# Patient Record
Sex: Male | Born: 1950 | Race: White | Hispanic: No | Marital: Married | Smoking: Never smoker
Health system: Southern US, Community
[De-identification: ages and names within clinical notes are randomized; demographics above are authoritative.]

## PROBLEM LIST (undated history)

## (undated) DIAGNOSIS — F32A Depression, unspecified: Secondary | ICD-10-CM

## (undated) DIAGNOSIS — Z515 Encounter for palliative care: Secondary | ICD-10-CM

## (undated) DIAGNOSIS — M1990 Unspecified osteoarthritis, unspecified site: Secondary | ICD-10-CM

## (undated) DIAGNOSIS — N179 Acute kidney failure, unspecified: Secondary | ICD-10-CM

## (undated) DIAGNOSIS — F419 Anxiety disorder, unspecified: Secondary | ICD-10-CM

## (undated) DIAGNOSIS — N400 Enlarged prostate without lower urinary tract symptoms: Secondary | ICD-10-CM

## (undated) DIAGNOSIS — I2510 Atherosclerotic heart disease of native coronary artery without angina pectoris: Secondary | ICD-10-CM

## (undated) DIAGNOSIS — I255 Ischemic cardiomyopathy: Secondary | ICD-10-CM

## (undated) DIAGNOSIS — N529 Male erectile dysfunction, unspecified: Secondary | ICD-10-CM

## (undated) DIAGNOSIS — I4819 Other persistent atrial fibrillation: Secondary | ICD-10-CM

## (undated) DIAGNOSIS — I10 Essential (primary) hypertension: Secondary | ICD-10-CM

## (undated) DIAGNOSIS — K219 Gastro-esophageal reflux disease without esophagitis: Secondary | ICD-10-CM

## (undated) DIAGNOSIS — N183 Chronic kidney disease, stage 3 unspecified: Secondary | ICD-10-CM

## (undated) DIAGNOSIS — D689 Coagulation defect, unspecified: Secondary | ICD-10-CM

## (undated) DIAGNOSIS — R0602 Shortness of breath: Secondary | ICD-10-CM

## (undated) DIAGNOSIS — R7989 Other specified abnormal findings of blood chemistry: Secondary | ICD-10-CM

## (undated) DIAGNOSIS — I4891 Unspecified atrial fibrillation: Secondary | ICD-10-CM

## (undated) DIAGNOSIS — E785 Hyperlipidemia, unspecified: Secondary | ICD-10-CM

## (undated) DIAGNOSIS — J9601 Acute respiratory failure with hypoxia: Secondary | ICD-10-CM

## (undated) DIAGNOSIS — I5042 Chronic combined systolic (congestive) and diastolic (congestive) heart failure: Secondary | ICD-10-CM

## (undated) DIAGNOSIS — Z955 Presence of coronary angioplasty implant and graft: Secondary | ICD-10-CM

## (undated) DIAGNOSIS — F329 Major depressive disorder, single episode, unspecified: Secondary | ICD-10-CM

## (undated) DIAGNOSIS — E119 Type 2 diabetes mellitus without complications: Secondary | ICD-10-CM

## (undated) DIAGNOSIS — I5043 Acute on chronic combined systolic (congestive) and diastolic (congestive) heart failure: Secondary | ICD-10-CM

## (undated) DIAGNOSIS — I341 Nonrheumatic mitral (valve) prolapse: Secondary | ICD-10-CM

## (undated) HISTORY — DX: Coagulation defect, unspecified: D68.9

## (undated) HISTORY — DX: Acute kidney failure, unspecified: N17.9

## (undated) HISTORY — DX: Essential (primary) hypertension: I10

## (undated) HISTORY — DX: Acute on chronic combined systolic (congestive) and diastolic (congestive) heart failure: I50.43

## (undated) HISTORY — DX: Presence of coronary angioplasty implant and graft: Z95.5

## (undated) HISTORY — DX: Gastro-esophageal reflux disease without esophagitis: K21.9

## (undated) HISTORY — DX: Other persistent atrial fibrillation: I48.19

## (undated) HISTORY — DX: Nonrheumatic mitral (valve) prolapse: I34.1

## (undated) HISTORY — DX: Unspecified atrial fibrillation: I48.91

## (undated) HISTORY — DX: Hyperlipidemia, unspecified: E78.5

## (undated) HISTORY — DX: Acute respiratory failure with hypoxia: J96.01

## (undated) HISTORY — DX: Encounter for palliative care: Z51.5

## (undated) HISTORY — DX: Atherosclerotic heart disease of native coronary artery without angina pectoris: I25.10

## (undated) HISTORY — DX: Benign prostatic hyperplasia without lower urinary tract symptoms: N40.0

## (undated) HISTORY — DX: Anxiety disorder, unspecified: F41.9

## (undated) HISTORY — DX: Shortness of breath: R06.02

## (undated) HISTORY — DX: Other specified abnormal findings of blood chemistry: R79.89

---

## 2002-12-13 HISTORY — PX: MITRAL VALVE ANNULOPLASTY: SHX2038

## 2005-12-13 HISTORY — PX: CARDIAC DEFIBRILLATOR PLACEMENT: SHX171

## 2009-08-24 ENCOUNTER — Ambulatory Visit: Payer: Self-pay | Admitting: Internal Medicine

## 2009-08-25 ENCOUNTER — Inpatient Hospital Stay (HOSPITAL_COMMUNITY): Admission: EM | Admit: 2009-08-25 | Discharge: 2009-08-27 | Payer: Self-pay | Admitting: Emergency Medicine

## 2009-08-25 ENCOUNTER — Encounter: Payer: Self-pay | Admitting: Cardiology

## 2009-08-25 ENCOUNTER — Other Ambulatory Visit: Payer: Self-pay | Admitting: Cardiology

## 2009-08-26 ENCOUNTER — Other Ambulatory Visit: Payer: Self-pay | Admitting: Cardiology

## 2009-08-26 ENCOUNTER — Ambulatory Visit: Payer: Self-pay | Admitting: Cardiovascular Disease

## 2009-08-27 ENCOUNTER — Other Ambulatory Visit: Payer: Self-pay | Admitting: Cardiology

## 2009-09-01 ENCOUNTER — Encounter: Payer: Self-pay | Admitting: Cardiology

## 2009-09-10 ENCOUNTER — Ambulatory Visit: Payer: Self-pay | Admitting: Cardiovascular Disease

## 2009-10-08 ENCOUNTER — Telehealth: Payer: Self-pay | Admitting: Cardiovascular Disease

## 2009-10-10 ENCOUNTER — Telehealth: Payer: Self-pay | Admitting: Cardiovascular Disease

## 2009-10-14 ENCOUNTER — Ambulatory Visit: Payer: Self-pay | Admitting: Cardiovascular Disease

## 2009-10-27 ENCOUNTER — Ambulatory Visit: Payer: Self-pay | Admitting: Internal Medicine

## 2009-10-28 ENCOUNTER — Telehealth: Payer: Self-pay | Admitting: Cardiovascular Disease

## 2009-10-30 ENCOUNTER — Encounter: Payer: Self-pay | Admitting: Cardiovascular Disease

## 2009-11-14 ENCOUNTER — Encounter: Payer: Self-pay | Admitting: Cardiovascular Disease

## 2009-11-17 ENCOUNTER — Ambulatory Visit: Payer: Self-pay

## 2009-11-17 ENCOUNTER — Encounter: Payer: Self-pay | Admitting: Cardiovascular Disease

## 2009-11-19 ENCOUNTER — Ambulatory Visit: Payer: Self-pay | Admitting: Cardiovascular Disease

## 2009-11-19 ENCOUNTER — Inpatient Hospital Stay (HOSPITAL_COMMUNITY): Admission: AD | Admit: 2009-11-19 | Discharge: 2009-11-21 | Payer: Self-pay | Admitting: Internal Medicine

## 2009-11-19 ENCOUNTER — Encounter: Payer: Self-pay | Admitting: Internal Medicine

## 2011-01-06 DIAGNOSIS — F319 Bipolar disorder, unspecified: Secondary | ICD-10-CM

## 2011-01-06 HISTORY — DX: Bipolar disorder, unspecified: F31.9

## 2011-03-16 LAB — COMPREHENSIVE METABOLIC PANEL
Albumin: 3.6 g/dL (ref 3.5–5.2)
Alkaline Phosphatase: 57 U/L (ref 39–117)
BUN: 19 mg/dL (ref 6–23)
Chloride: 102 mEq/L (ref 96–112)
Creatinine, Ser: 1.23 mg/dL (ref 0.4–1.5)
Glucose, Bld: 135 mg/dL — ABNORMAL HIGH (ref 70–99)
Potassium: 3.4 mEq/L — ABNORMAL LOW (ref 3.5–5.1)
Total Bilirubin: 0.6 mg/dL (ref 0.3–1.2)

## 2011-03-16 LAB — CARDIAC PANEL(CRET KIN+CKTOT+MB+TROPI)
CK, MB: 1.1 ng/mL (ref 0.3–4.0)
Relative Index: 1.7 (ref 0.0–2.5)
Total CK: 71 U/L (ref 7–232)

## 2011-03-16 LAB — LIPID PANEL
Total CHOL/HDL Ratio: 4.5 RATIO
VLDL: 27 mg/dL (ref 0–40)

## 2011-03-16 LAB — BRAIN NATRIURETIC PEPTIDE: Pro B Natriuretic peptide (BNP): 115 pg/mL — ABNORMAL HIGH (ref 0.0–100.0)

## 2011-03-16 LAB — GLUCOSE, CAPILLARY
Glucose-Capillary: 120 mg/dL — ABNORMAL HIGH (ref 70–99)
Glucose-Capillary: 149 mg/dL — ABNORMAL HIGH (ref 70–99)
Glucose-Capillary: 158 mg/dL — ABNORMAL HIGH (ref 70–99)
Glucose-Capillary: 165 mg/dL — ABNORMAL HIGH (ref 70–99)
Glucose-Capillary: 188 mg/dL — ABNORMAL HIGH (ref 70–99)

## 2011-03-16 LAB — CK TOTAL AND CKMB (NOT AT ARMC)
Relative Index: INVALID (ref 0.0–2.5)
Total CK: 67 U/L (ref 7–232)

## 2011-03-16 LAB — CBC
HCT: 38.5 % — ABNORMAL LOW (ref 39.0–52.0)
Hemoglobin: 13.4 g/dL (ref 13.0–17.0)
MCHC: 34.9 g/dL (ref 30.0–36.0)
MCHC: 35.1 g/dL (ref 30.0–36.0)
MCV: 91.2 fL (ref 78.0–100.0)
Platelets: 179 10*3/uL (ref 150–400)
RBC: 4.22 MIL/uL (ref 4.22–5.81)
RDW: 12.7 % (ref 11.5–15.5)
RDW: 12.9 % (ref 11.5–15.5)

## 2011-03-16 LAB — BASIC METABOLIC PANEL
BUN: 16 mg/dL (ref 6–23)
CO2: 25 mEq/L (ref 19–32)
Calcium: 8.7 mg/dL (ref 8.4–10.5)
Chloride: 107 mEq/L (ref 96–112)
Creatinine, Ser: 1.35 mg/dL (ref 0.4–1.5)

## 2011-03-16 LAB — DIFFERENTIAL
Basophils Absolute: 0 10*3/uL (ref 0.0–0.1)
Basophils Relative: 0 % (ref 0–1)
Eosinophils Absolute: 0 10*3/uL (ref 0.0–0.7)
Neutro Abs: 5.6 10*3/uL (ref 1.7–7.7)
Neutrophils Relative %: 66 % (ref 43–77)

## 2011-03-16 LAB — MRSA PCR SCREENING

## 2011-03-16 LAB — PROTIME-INR
INR: 1.09 (ref 0.00–1.49)
Prothrombin Time: 14 seconds (ref 11.6–15.2)

## 2011-03-19 LAB — POCT I-STAT, CHEM 8
BUN: 18 mg/dL (ref 6–23)
Hemoglobin: 14.3 g/dL (ref 13.0–17.0)
Potassium: 3.4 mEq/L — ABNORMAL LOW (ref 3.5–5.1)
Sodium: 139 mEq/L (ref 135–145)
TCO2: 21 mmol/L (ref 0–100)

## 2011-03-19 LAB — CBC
HCT: 41.4 % (ref 39.0–52.0)
Hemoglobin: 12.7 g/dL — ABNORMAL LOW (ref 13.0–17.0)
Hemoglobin: 13.2 g/dL (ref 13.0–17.0)
Hemoglobin: 14.4 g/dL (ref 13.0–17.0)
MCHC: 34.6 g/dL (ref 30.0–36.0)
MCHC: 34.8 g/dL (ref 30.0–36.0)
MCV: 91.9 fL (ref 78.0–100.0)
MCV: 92.4 fL (ref 78.0–100.0)
Platelets: 193 10*3/uL (ref 150–400)
RBC: 4.15 MIL/uL — ABNORMAL LOW (ref 4.22–5.81)
RBC: 4.48 MIL/uL (ref 4.22–5.81)
RDW: 13.5 % (ref 11.5–15.5)

## 2011-03-19 LAB — BASIC METABOLIC PANEL
CO2: 23 mEq/L (ref 19–32)
Calcium: 8.7 mg/dL (ref 8.4–10.5)
Chloride: 103 mEq/L (ref 96–112)
Creatinine, Ser: 1.34 mg/dL (ref 0.4–1.5)
GFR calc Af Amer: >60 mL/min (ref >60)
GFR calc Af Amer: >60 mL/min (ref >60)
GFR calc non Af Amer: 54 mL/min — ABNORMAL LOW (ref >60)
Glucose, Bld: 150 mg/dL — ABNORMAL HIGH (ref 70–99)
Sodium: 133 mEq/L — ABNORMAL LOW (ref 135–145)
Sodium: 138 mEq/L (ref 135–145)

## 2011-03-19 LAB — POCT CARDIAC MARKERS
Myoglobin, poc: 161 ng/mL (ref 12–200)
Troponin i, poc: <0.05 ng/mL (ref 0.00–0.09)

## 2011-03-19 LAB — TROPONIN I: Troponin I: 0.02 ng/mL (ref 0.00–0.06)

## 2011-03-19 LAB — GLUCOSE, CAPILLARY
Glucose-Capillary: 141 mg/dL — ABNORMAL HIGH (ref 70–99)
Glucose-Capillary: 143 mg/dL — ABNORMAL HIGH (ref 70–99)
Glucose-Capillary: 209 mg/dL — ABNORMAL HIGH (ref 70–99)

## 2011-03-19 LAB — HEMOGLOBIN A1C: Mean Plasma Glucose: 157 mg/dL

## 2011-03-19 LAB — LIPID PANEL
LDL Cholesterol: 66 mg/dL (ref 0–99)
Triglycerides: 134 mg/dL (ref <150)

## 2011-03-19 LAB — DIFFERENTIAL
Basophils Relative: 1 % (ref 0–1)
Eosinophils Relative: 1 % (ref 0–5)
Monocytes Absolute: 0.7 10*3/uL (ref 0.1–1.0)
Monocytes Relative: 8 % (ref 3–12)
Neutro Abs: 5.4 10*3/uL (ref 1.7–7.7)

## 2011-03-19 LAB — CK TOTAL AND CKMB (NOT AT ARMC): Total CK: 344 U/L — ABNORMAL HIGH (ref 7–232)

## 2011-03-19 LAB — CARDIAC PANEL(CRET KIN+CKTOT+MB+TROPI)
CK, MB: 2.4 ng/mL (ref 0.3–4.0)
Total CK: 320 U/L — ABNORMAL HIGH (ref 7–232)

## 2011-03-19 LAB — APTT: aPTT: 32 seconds (ref 24–37)

## 2011-03-19 LAB — TSH: TSH: 2.901 u[IU]/mL (ref 0.350–4.500)

## 2011-04-27 NOTE — Assessment & Plan Note (Signed)
[place] OFFICE NOTE   NAME:[name], [name]                    MRN:          [id number]  DATE:09/10/2009                            DOB:          [birth year]    CHIEF COMPLAINT:  Establishing cardiovascular care.   HISTORY OF PRESENT ILLNESS:  [name] [name] is a 60 year old white male  with past medical history significant for ischemic cardiomyopathy with  an ejection fraction of 25%, status post ICD placement and multiple  coronary interventions, status post, what he believes to be a dual-  chamber pacemaker and ICD, hypertension, dyslipidemia, who is presenting  today after a percutaneous angioplasty at [facility].  The  patient states his cardiac history began around 2001 when he experienced  some chest pain and had a subsequent percutaneous intervention to an  unknown artery and he had volunteered in 2005, at which time he  underwent a mitral valve repair at [facility].  Several  years after that, the patient underwent a dual-chamber pacemaker and ICD  placement.  The patient moved to [place] approximately 1 month ago.  He  was admitted to [facility] earlier this month with new-onset chest  discomfort.  He underwent a left heart catheterization that demonstrated  a global ejection fraction of 45% and a high-grade second stenosis of  90%.  The patient underwent PTCA of the diagonal branch with a residual  stenosis of approximately 70%.  The patient states that since he has  been at home, he has not had recurrence of chest pain to that severity.  He states that he chronically has some chest discomfort upon heavy  exertion; however, he has not needed to take any nitroglycerin for it as  it is promptly relieved with rest and has not been bothersome to him.  The patient plans on starting cardiac rehab at [facility] within  the next week.  He states that he is not very  active, however, he can  perform his daily activities without any significant dyspnea.  He does  experience dyspnea if he were to perform any activities that were above  his every day exertion level.  The patient denies any lower extremity  edema, but does endorse chronic 2-3 pillow orthopnea and occasional PND.  He experiences dizziness if he stands up too quickly and has been  diagnosed with orthostatic hypotension by a tilt table test in the past.  He denies any recent syncopal episodes.   PAST MEDICAL HISTORY:  As above in HPI.  Of note, the dual-chamber  device and ICD is a St. [name] device.  The patient also carries a  diagnoses of diabetes and orthostatic hypotension.   SOCIAL HISTORY:  No tobacco, no alcohol.  He lives here in [place] with  his wife.   FAMILY HISTORY:  Positive for premature coronary artery disease.   ALLERGIES:  No known drug allergies.   MEDICATIONS:  1. Aspirin 81 mg daily.  2. Plavix 75 mg daily.  3. Coreg 25 mg b.i.d.  4. Lisinopril 5 mg daily.  5. Klor-Con 20 mg daily.  6. Tramadol 50 mg  b.i.d. p.r.n.  7. Pravastatin 20 mg p.o. nightly.  8. Terazosin, an unknown dose.  9. Glimepiride 4 mg daily.  10.Boniva 50 mg b.i.d.   REVIEW OF SYSTEMS:  Positive for bilateral knee pain and rotator cuff  pain as well.  He endorses a generalized fatigue and weakness.  He  denies any hematochezia or melena.  All other systems as in HPI,  otherwise negative.   PHYSICAL EXAMINATION:  VITAL SIGNS:  His blood pressure is 154/106,  pulse 62, sating 99% on room, he weighs 258 pounds.  GENERAL:  He is in no acute distress.  HEENT:  Normocephalic, atraumatic.  NECK:  Supple.  There is no JVD.  HEART:  Regular rate and rhythm with occasional ectopy.  There is no  murmur auscultated, although the heart sounds are distant.  LUNGS:  Clear bilaterally.  ABDOMEN:  Soft, nontender, and nondistended.  EXTREMITIES:  Without edema.  SKIN:  Cool and dry.  The patient has 2+  bilateral carotid, radial and  posterior tibial pulses.  NEURO:  Nonfocal.  PSYCHIATRIC:  The patient is appropriate with normal levels of insight.   Review of the patient's echocardiogram dated August 25, 2009, shows  an ejection fraction of 55-60%.  There was mild to moderate mitral  regurgitation and the left atrium was mildly dilated.  The patient's  labs from the hospital stay showed a sodium of 138, potassium 3.8,  chloride 108, CO2 23, BUN 11, creatinine 1.3, glucose 150, white count  7, hemoglobin 13, platelet count 193, TSH 2.9.  His LDL was 66, HDL 31,  triglycerides 134.   An EKG independently reviewed and interpreted by myself demonstrates  normal sinus rhythm with frequent PVCs and possible PACs with aberrant  conduction.  There is a poor R-wave progression.   ASSESSMENT AND PLAN:  A 60 year old white male with coronary artery  disease, a history of an ischemic cardiomyopathy.  However, his most  recent evaluation of his ejection fraction was normal per record and  only mildly reduced per the left ventriculogram, who is presenting with  symptoms consistent with NYHA class II heart failure.  He is not having  any symptoms consistent with unstable angina and his post PCI course has  been uncomplicated.  The plan today as the patient's blood pressure is  not well controlled, we will increase his lisinopril from 5 to 10mg   daily.  We will do this very slowly as the patient states his blood  pressure is usually mildly elevated at home, but in the past it has been  as low as 90 systolic.  The patient also carries a history of  symptomatic orthostatic hypotension.  The patient is instructed to bring  his blood pressure log to the office so that we may review it.  He will  likely require further titration of the lisinopril.  We also recommend  that he increase his aspirin dose to 325 mg for the next month or two,  at which point he can decrease it to 81 mg if the Plavix is to  be  continued.  We will make a referral for the patient to see Dr. Caryl [name]  here for pacemaker and ICD interrogation.  The patient's LDL is at goal on his pravastatin; however, the HDL is  low.  The patient is to start  cardiac rehab program and this will hopefully provide some improvement  in the HDL.  The patient will follow up me in 3 months' time.  Arlee Muslim, MD  Electronically Signed    SGA/MedQ  DD: 09/10/2009  DT: 09/11/2009  Job #: [id number]

## 2011-04-27 NOTE — Assessment & Plan Note (Signed)
[facility] OFFICE NOTE   NAME:[name] [name], [name] [name]                    MRN:          [id number]  DATE:10/14/2009                            DOB:          05/19/1951    PROBLEM LIST.:  1. Ischemic cardiomyopathy with a prior ejection fraction of 25%.      Most recent echo dated August 25, 2009 showed an EF of 55-60%.      He is also status post what he believes to be a dual-chamber      pacemaker and ICD.  2. Coronary artery disease status post multiple coronary      interventions.  He underwent a PTCA of a diagonal branch with a      residual stenosis of approximately 70%.  This was performed on      August 25, 2009 after he presented with chest discomfort.  He      has not had recurrence of this chest discomfort since.  3. Hypertension.  4. Dyslipidemia.   INTERVAL HISTORY:  The patient states since his last visit, he has had a  great difficulty controlling his blood pressure.  He reports to the  emergency room at [facility] at the end of October 2010 with a  blood pressure of 221/138.  He was given IV hydralazine and his  outpatient p.o. dose Lopressor was increased.  The patient states that  while at home, his diastolic blood pressures have oftentimes been in the  120 range.  His systolic blood pressures have also been running around  200.  He endorses some occasional lightheadedness associated with his  high blood pressures, but denies any chest discomfort or syncope.  He  has been compliant with his blood pressure medications.  He has an  appointment set up with Dr. Laqueta Due for later on today.   Upon arrival today to the office, the patient was feeling slightly  lightheaded with a blood pressure in his right arm was 198/128, in his  left arm was 198/120 with a pulse of 60.  The patient was initially  advised that he should go to the emergency room.  However, the patient  stated that he  would strongly prefer not to go to the emergency room  again.  An IV was started here in the office and he was given Lopressor  5 mg IV x1.  The patient's blood pressures monitored frequently.  Initially, decreased to about 142/92.  After approximately 40 minutes,  the patient's blood pressure was 160/102.  The patient's lightheadedness  has completely resolved.  During the episode of hypertension, the  patient did have a brief episode of substernal chest discomfort.  EKG  taken at that time showed no changes that were concerning for active  ischemia.  The patient has a St. [name] BiV ICD.   PAST MEDICAL HISTORY:  As above.   SOCIAL HISTORY:  No tobacco or no alcohol.  He does live here in  [place].   FAMILY HISTORY:  Positive for premature coronary artery disease.   ALLERGIES:  No known drug allergies.   CURRENT MEDICATIONS:  1. Aspirin  81 mg daily.  2. Plavix 75 mg daily.  3. Lisinopril 20 mg daily.  4. Coreg 25 mg daily.  5. Pravastatin 20 mg daily.  6. Aldactone 25 mg daily.   The patient also takes potassium supplementation, Nexium, Januvia, and  glimepiride.   REVIEW OF SYSTEMS:  As above in HPI, otherwise negative.   PHYSICAL EXAMINATION:  VITAL SIGNS:  Again initially, the patient's  blood pressure was 198/128.  Upon discharge from the office, it was  160/102.  GENERAL:  He is in no acute distress.  HEENT:  Nonfocal.  NECK:  Supple.  There is no JVD.  HEART:  Regular rate and rhythm without murmur, rub, or gallop.  LUNGS:  Clear bilaterally.  ABDOMEN:  Soft, nontender, and nondistended.  EXTREMITIES:  Without edema.  SKIN:  Warm and dry.  PSYCHIATRIC:  The patient is appropriate with normal levels of insight.  NEURO:  Nonfocal.   Review of the patient's labs from the emergency room from the end of  October, 2010 showed a CK of 141, troponin of 0.02, his BNP was 1080.  His CMP was within normal limits with a creatinine 1.2.   ASSESSMENT AND PLAN:  1.  Hypertension.  The patient was kept here in clinic today for      approximately 2 hours to have his blood pressure control with IV      medication.  The intervention was successful.  We have called in      Maxzide 25/37.5 mg daily.  The patient will go to the pharmacy      after leaving the office and immediately begin taking this      medication on a daily basis.  We will also increase his lisinopril      from 20 mg to 40 mg daily and he will begin taking Coreg 12.5 mg in      the evening in addition to the 25 mg that he takes in the morning.      The patient is instructed to check his blood pressure prior to each      medication dose to assure that he is not experiencing any low blood      pressures.  This will be important as the patient states in the      past he has had some issues with hypotension.  It may be that these      episodes of hypotension occurred when his ejection fraction was      25%.  However, his EF is now normal.  We will arrange for renal      artery duplex scans to be performed at our [facility] office to      rule out renal artery stenosis.  We will also check a serum renin      and aldosterone level in order to help investigate causes of      secondary hypertension.  2. Coronary artery disease.  The patient is not having any symptoms      concerning for unstable coronary disease.  He did have some chest      discomfort associated with his hypertension today that promptly      resolved after resolution of the severely high blood pressure.  He      should continue on his aspirin, statin, beta-blocker, and ACE      inhibitor.  3. Hyperlipidemia.  The patient is on pravastatin 20 mg every evening.      His LDL is 66, his HDL  is 31, and his triglyceride level is 134.      These labs are from August 25, 2009.  We will continue him on      his current dose of statin.  4. Diabetes.  The patient had a hemoglobin A1c of 7.1, is currently on      treatment for  diabetes.  He will follow up with Dr. Laqueta Due for      this.  The patient will follow up with Dr. Laqueta Due later on the      week and he is instructed to check his blood pressure before each      medication dose and for a total of at least 3 times a day.  He will      contact our office for any extremely high blood pressures.      Otherwise, we will see him back in several weeks' time.     Arlee Muslim, MD  Electronically Signed    SGA/MedQ  DD: 10/14/2009  DT: 10/15/2009  Job #: [id number]

## 2011-04-27 NOTE — Assessment & Plan Note (Signed)
[place] OFFICE NOTE   NAME:[name] [name], [name] [name]                    MRN:          [id number]  DATE:11/19/2009                            DOB:          07-29-1951    CHIEF COMPLAINT:  Hypertension and chest pain.   HISTORY OF PRESENT ILLNESS:  Mr. Fawbush is a 60 year old white male,  past medical history significant for coronary artery disease, ischemic  cardiomyopathy with his last ejection fraction being approximately 45%,  resistant hypertension, dyslipidemia, DM who presented to the outpatient  clinic with worsening chest discomfort and elevated blood pressures.  The patient states for the past week he has had issues with chest  discomfort throughout the day and sometimes at night.  He states that  the chest discomfort oftentimes can last all day without remission.  It  is associated with some shortness of breath and occasionally with some  nausea.  The patient states he has woken up in the middle of the night  with shortness of breath, requiring him to walk around in order to  relieve it.  The patient also endorses some subjective weakness of the  left lower extremity, as well as some intermittent visual disturbances.  He has been compliant with all of his medications.   PAST MEDICAL HISTORY:  Coronary artery disease.  The patient's last left  heart catheterization was in September of this year at which time he was  found to have a high-grade proximal lesion in his second diagonal  vessel.  The lesion was very difficult to cross and was only treated  with angioplasty.  There was a significant residual stenosis after the  angioplasty.  However, the vessel was thought to be only approximately 2  mm in diameter.  The ejection fraction was 45% at that time.  Since  discharge the patient had been getting along fairly well.  His blood  pressure medicines have been titrated up.  He saw Dr. Caryl Comes in  pacemaker  clinic, was found to have one prolonged episode of atrial fibrillation  upon interrogation of his ICD device.  Anticoagulation therapy at that  time was delayed until the patient was shown to have a second episode of  atrial fibrillation.  The patient comes into clinic today stating that,  for the past week he has overall had increased fatigue, very frequent  episodes of chest discomfort that sometimes last all day long.  He is  actively having some mild chest discomfort here in the office.  He  denies any syncopal events.  He has been compliant with all his  medications.  Other PMH as in HPI.   SOCIAL HISTORY:  No tobacco, no alcohol.  He lives in [place] with his  wife.   FAMILY HISTORY:  Is positive for premature coronary disease.   ALLERGIES:  NO KNOWN DRUG ALLERGIES.   MEDICATIONS:  1. Aspirin 81 mg daily.  2. Plavix 75 mg daily.  3. Maxzide 25/37.5 mg daily.  4. Lisinopril 40 mg daily.  5. Coreg 25 mg twice a day.  6. Pravastatin 20 mg every evening.  7. Aldactone 25 mg  every day.  8. Januvia 50 mg b.i.d.  9. Glimepiride 8 mg every day.  10.Tramadol.  11.Potassium supplementation.  12.Nexium.   REVIEW OF SYSTEMS:  As in HPI, other systems reviewed otherwise  negative.   PHYSICAL EXAM:  VITAL SIGNS:  Blood pressure of 172/124, pulse 70,  satting 94% on room air.  He weighs 260 pounds which is what he weighed  at his last office visit.  GENERAL:  He is in no acute distress.  HEENT:  Normocephalic, atraumatic.  NECK:  Supple.  HEART:  Regular rate and rhythm without murmur, rub or gallop.  LUNGS:  Clear bilaterally.  ABDOMEN:  Soft, nontender, nondistended.  EXTREMITIES:  Without edema.  SKIN:  Warm and dry.  MUSCULOSKELETAL:  The patient has 5/5 bilateral upper and lower  extremity strength.  NEUROLOGIC EXAM:  Nonfocal.  PSYCHIATRIC:  Patient is appropriate with normal levels of insight.   EKG taken today in clinic, independently interpreted by  myself,  demonstrates normal sinus rhythm with premature ventricular  contractions.  There is also artifact and some beats that appear to be  ventricularly paced.   ASSESSMENT/PLAN:  1. Hypertension.  The patient's blood pressure is severely elevated      today and he has been having intermittent end organ symptoms over      the past week.  He is given  clonidine 0.1mg  x2 in clinic.  We will      attempt to decrease his blood pressure to a reasonable level with      this.  He will be transferred to [facility] for further      blood pressure management.  He is currently on maximum dose      diuretic, ACE inhibitor and beta blocker.  We will institute      therapy with amlodipine 10mg  daily.  Of note, the patient had a      renal duplex study that showed no evidence of renal artery      stenosis.  A renin and aldosterone level were ordered to be drawn.      However, he has not had this done yet.  2. Intermittent visual changes and subjective weakness.  The patient      should have a CT scan of his head to rule out any acute infarct.      It is likely that these symptoms are related to his uncontrolled      hypertension.  3. Chest discomfort.  The chest discomfort may also be related to the      uncontrolled hypertension.  However, the patient has known coronary      artery disease and had an angioplasty to a diagonal vessel several      months ago with a suboptimal result.  Therefore, the intermittent      chest discomfort he is experiencing may be secondary to angina.  He      will need to be ruled out for acute myocardial infarction.  Once a      CVA is ruled out, it may be prudent to place him on heparin until      he rules out for myocardial infarction.  Once his blood pressure is      controlled, if he is still having symptoms, a repeat heart      catheterization would be prudent.  4. Diabetes.  He will be placed on sliding scale insulin.  5. Dyslipidemia.  He The patient  will be continued on his pravastatin.  This case was discussed with Dr. Cristopher Peru who will be the accepting  physician.     Arlee Muslim, MD  Electronically Signed    SGA/MedQ  DD: 11/19/2009  DT: 11/19/2009  Job #: [id number]

## 2011-04-27 NOTE — Assessment & Plan Note (Signed)
[facility] OFFICE NOTE   NAME:[name] [name], [name] [name]                    MRN:          [id number]  DATE:10/27/2009                            DOB:          1951-09-05    Mr. Maranto is seen for followup of an ICD that was implanted in  [place], presumably for primary prevention in the setting of ischemic  heart disease with prior multiple interventions.  He has a history of  hypertension, diabetes, and recently underwent catheterization and POBA.  He has modest depression of LV function of 45%.   He has some episodes of lightheadedness.  He has had no problems with  chest pain or shortness of breath of late.   His medication list is legion and includes:  1. Spironolactone 25.  2. Glimepiride 8.  3. Plavix 75.  4. Januvia 50 b.i.d.  5. Coreg 25 b.i.d.  6. Aspirin 81.  7. Nexium.  8. Pravastatin 20.  9. Klor-Con.  10.Recently uptitrated lisinopril from 10-20.   On examination, his blood pressure 178/94, his pulse was 70.  His weight  was 260.  His neck veins were flat.  His lungs were clear.  His heart  sounds were regular.  There was no peripheral edema.  He was alert and  oriented.  The skin was warm and dry.   Interrogation of his defibrillator demonstrate the presence of an Atlas  II.  The atrial amplitude was 3 with pace impedance of 620, threshold  0.75 at 0.5.  The R-wave was 12 with pace impedance of 375, threshold 1  V at 0.5.  Battery voltage was 2.84.  Further interrogation demonstrated  an episode of sustained atrial fibrillation that by storage criteria  lasted about 8 days.  There was no recalled symptoms of this episode in  March.   This is a new diagnosis.   IMPRESSION:  1. Ischemic heart disease.  2. Status post implantable cardioverter-defibrillator for presumed      primary prevention.  3. New diagnosis of atrial fibrillation.  4. Thromboembolic risk factors notable for:  a.     Hypertension.      b.     Left ventricular dysfunction.      c.     Diabetes for congestive heart failure, hypertension, age,       diabetes, prior stroke score of 3 and a congestive heart failure,       hypertension, age, diabetes, prior stroke VAS score of 4.   Mr. Freelove defibrillator is doing fine.  There are no intercurrent  therapies.  I am concerned given his atrial fibrillation episode that he  will need to go on anticoagulation.  We will plan to see him again in 3  months and follow [place] I think with his next prolonged episode,  anticoagulation with Coumadin and/or dabigatran would be appropriate.   He will be following up with Dr. Zenia Resides in the next couple of weeks  because of his blood pressures which was quite elevated.  I thought  maybe about increasing his lisinopril today but it was just increased  the other day.  Amlodipine  may be an appropriate adjunctive drug.     Deboraha Sprang, MD, [facility]  Electronically Signed    SCK/MedQ  DD: 10/27/2009  DT: 10/28/2009  Job #: [id number]

## 2011-04-30 NOTE — Letter (Signed)
February 23, 2010    [name]  [address]  [place], [place]  [address]   RE:  [name] [name], [name] [name]  MRN:  [id number]  /  DOB:  Jan 04, 1951   [name],   We have missed you at your last 2 cardiology appointments and have been  unable to reach you by phone.  Please contact our office at your  earliest convenience if you would like to reschedule with [place].    Sincerely,      Arlee Muslim, MD  Electronically Signed    SGA/MedQ  DD: 02/23/2010  DT: 02/23/2010  Job #: [id number]

## 2011-05-05 ENCOUNTER — Encounter: Payer: Self-pay | Admitting: Cardiology

## 2011-05-05 ENCOUNTER — Ambulatory Visit (INDEPENDENT_AMBULATORY_CARE_PROVIDER_SITE_OTHER): Payer: Medicare PPO | Admitting: Cardiology

## 2011-05-05 VITALS — BP 126/84 | HR 64 | Ht 71.0 in | Wt 241.6 lb

## 2011-05-05 DIAGNOSIS — K219 Gastro-esophageal reflux disease without esophagitis: Secondary | ICD-10-CM | POA: Insufficient documentation

## 2011-05-05 DIAGNOSIS — I255 Ischemic cardiomyopathy: Secondary | ICD-10-CM

## 2011-05-05 DIAGNOSIS — I2589 Other forms of chronic ischemic heart disease: Secondary | ICD-10-CM

## 2011-05-05 DIAGNOSIS — E785 Hyperlipidemia, unspecified: Secondary | ICD-10-CM

## 2011-05-05 DIAGNOSIS — F419 Anxiety disorder, unspecified: Secondary | ICD-10-CM | POA: Insufficient documentation

## 2011-05-05 DIAGNOSIS — I2510 Atherosclerotic heart disease of native coronary artery without angina pectoris: Secondary | ICD-10-CM

## 2011-05-05 DIAGNOSIS — I259 Chronic ischemic heart disease, unspecified: Secondary | ICD-10-CM

## 2011-05-05 DIAGNOSIS — I10 Essential (primary) hypertension: Secondary | ICD-10-CM | POA: Insufficient documentation

## 2011-05-05 DIAGNOSIS — I059 Rheumatic mitral valve disease, unspecified: Secondary | ICD-10-CM

## 2011-05-05 DIAGNOSIS — I340 Nonrheumatic mitral (valve) insufficiency: Secondary | ICD-10-CM

## 2011-05-05 NOTE — Assessment & Plan Note (Addendum)
He has a history of severe LV dysfunction with ejection fraction of 24%. He is on appropriate medical therapy with the exception of the fact that he is not on an ACE inhibitor. He apparently has been on lisinopril in the past and he cannot recall why it was stopped. We will need to obtain his old records to review. He is status post prophylactic ICD placement. Once we have completed review of his prior medical history we will establish him in our ICD followup clinic.

## 2011-05-05 NOTE — Assessment & Plan Note (Signed)
He is status post mitral valve repair. 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 He still has a mitral insufficiency murmur. If he has not had a recent echocardiogram we may need to repeat this.

## 2011-05-05 NOTE — Patient Instructions (Signed)
We will continue your current medications.  We will get records for all your prior cardiac procedures.  Continue with your weight loss.   I will see you back in 3-4 weeks to review everytthing.

## 2011-05-05 NOTE — Assessment & Plan Note (Signed)
Patient is status post stenting in the past. His most recent cardiac catheterization demonstrated some obstructive disease but he is being treated medically. He is on appropriate antianginal therapy. He is on aspirin and Effient. He has class II symptoms. We will need to obtain records of his prior treatment particularly results of his last cardiac catheterization. I would like to review his prior films and see if he is a candidate for any revascularization procedures. I'll plan on seeing him back in 4 weeks to review his records.

## 2011-05-05 NOTE — Progress Notes (Signed)
[name] Date of Birth: 18-Jan-1951   History of Present Illness: Mr. Finnegan is seen at the request of Dr. Lin Landsman to establish cardiac care. Patient has an extensive cardiac history. Unfortunately we have no records pertaining to this. This is all based on the patient's history today. He reports that he had coronary stents placed in 2004- 2005 in [place]. He has had open heart surgery with mitral valve repair at [facility]. He has had a previous ICD placement in May of 2007 in [place] with a [place]. [name] V-268 generator serial [id number]. This is a dual-chamber device. He reports that he has had  a low ejection fraction of 24%. More recently he was hospitalized in [facility] with chest pain and shortness of breath. He underwent a cardiac catheterization and was told that he had blockage in 2 arteries. Medical therapy was recommended. He states he's had series of followup stress tests. He cannot recall when he had his last echocardiogram. He does think that they checked his defibrillator when he was in the hospital. Currently he states he has some chest pain but that it is not bad. He has episodic shortness of breath and dizziness with some sweating. He tries to walk 10-15 minutes per day. Over the past 3 months he reports a 50 pound weight loss related to dietary changes. He has had some intermittent nausea. He denies any significant orthopnea or PND at this time.  Current Outpatient Prescriptions on File Prior to Visit  Medication Sig Dispense Refill  . atorvastatin (LIPITOR) 10 MG tablet Take 10 mg by mouth daily.        . carvedilol (COREG) 25 MG tablet Take 25 mg by mouth 2 (two) times daily with a meal.        . esomeprazole (NEXIUM) 40 MG capsule Take 40 mg by mouth daily before breakfast.        . FLUoxetine (PROZAC) 40 MG capsule Take 40 mg by mouth daily.        Marland Kitchen glimepiride (AMARYL) 4 MG tablet Take 4 mg by mouth 2 (two) times daily.         . isosorbide mononitrate (IMDUR) 30 MG 24 hr tablet Take 30 mg by mouth daily.        . niacin (NIASPAN) 500 MG CR tablet Take 500 mg by mouth at bedtime.        . potassium chloride SA (K-DUR,KLOR-CON) 20 MEQ tablet Take 20 mEq by mouth daily.        . prasugrel (EFFIENT) 10 MG TABS Take 10 mg by mouth daily.        . ranolazine (RANEXA) 1000 MG SR tablet Take 1,000 mg by mouth 2 (two) times daily.        . traZODone (DESYREL) 100 MG tablet Take 100 mg by mouth at bedtime.          No Known Allergies  Past Medical History  Diagnosis Date  . Hypertension   . Hyperlipidemia   . GERD (gastroesophageal reflux disease)   . MVP (mitral valve prolapse)   . IHD (ischemic heart disease)   . Anxiety   . LV dysfunction     Past Surgical History  Procedure Date  . Cardiac defibrillator placement   . Coronary angioplasty   . Mitral valve repair   . Coronary stent placement     History  Smoking status  . Never Smoker   Smokeless tobacco  . Not on file  History  Alcohol Use No    Family History  Problem Relation Age of Onset  . Coronary artery disease Mother   . Colon cancer Mother   . Heart attack Mother   . Heart disease Mother   . Heart failure Mother   . Hypertension Mother   . Colon cancer Sister   . Coronary artery disease Brother   . Heart disease Brother     Review of Systems: The review of systems is positive for weight loss.  He does have some fatigue. He is disabled. He does follow a low-salt diet. He has no history of stroke or TIA. He denies any significant ICD discharges except for one time when his device was being interrogated.All other systems were reviewed and are negative.  Physical Exam: BP 126/84  Pulse 64  Ht 5\' 11"  (1.803 m)  Wt 241 lb 9.6 oz (109.589 kg)  BMI 33.70 kg/m2 He is a pleasant overweight white male in no acute distress. He is normocephalic, atraumatic. Pupils are equal round and reactive to light and accommodation. Extraocular  movements are full. Oropharynx is clear. Neck is supple without JVD, adenopathy, thyromegaly, or bruits. Lungs are clear. Cardiac exam reveals a regular rate and rhythm with a grade 2/6 holosystolic murmur heard best at the apex radiating to the left axilla. There is no S3. His defibrillator site in the left subclavicular area appears normal. He has a healed median sternotomy scar. He has marked central obesity. Femoral and pedal pulses are 2+ and symmetric. He has no significant edema. Skin is warm and dry. He is alert and oriented x3. Cranial nerves II through XII are intact. LABORATORY DATA: ECG today demonstrates normal sinus rhythm with occasional PVCs. He has occasional atrially paced beats. 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  Assessment / Plan:

## 2011-05-05 NOTE — Assessment & Plan Note (Signed)
Based on his recent levels his lipids appear to be under good control. He will continue his current therapy.

## 2011-05-12 ENCOUNTER — Telehealth: Payer: Self-pay | Admitting: Cardiology

## 2011-05-12 NOTE — Telephone Encounter (Signed)
Called wanting to know if we had received his records from [facility] in [place]. Advised we had not received any records from that hosp or from [facility]

## 2011-05-12 NOTE — Telephone Encounter (Signed)
Called in wanting to see if we have received his release of information from Dr. [facility] office in [place]. Please call back.

## 2011-06-04 ENCOUNTER — Ambulatory Visit: Payer: Medicare PPO | Admitting: Cardiology

## 2011-06-10 ENCOUNTER — Ambulatory Visit: Payer: Medicare PPO | Admitting: Cardiology

## 2011-06-17 ENCOUNTER — Other Ambulatory Visit: Payer: Self-pay | Admitting: Cardiology

## 2011-06-17 ENCOUNTER — Telehealth: Payer: Self-pay | Admitting: Cardiology

## 2011-06-17 ENCOUNTER — Ambulatory Visit (INDEPENDENT_AMBULATORY_CARE_PROVIDER_SITE_OTHER): Payer: Medicare Other | Admitting: Cardiology

## 2011-06-17 ENCOUNTER — Encounter: Payer: Self-pay | Admitting: Cardiology

## 2011-06-17 VITALS — BP 152/106 | HR 67 | Ht 72.0 in | Wt 248.2 lb

## 2011-06-17 DIAGNOSIS — I059 Rheumatic mitral valve disease, unspecified: Secondary | ICD-10-CM

## 2011-06-17 DIAGNOSIS — I10 Essential (primary) hypertension: Secondary | ICD-10-CM

## 2011-06-17 DIAGNOSIS — Z79899 Other long term (current) drug therapy: Secondary | ICD-10-CM

## 2011-06-17 DIAGNOSIS — I340 Nonrheumatic mitral (valve) insufficiency: Secondary | ICD-10-CM

## 2011-06-17 DIAGNOSIS — I2510 Atherosclerotic heart disease of native coronary artery without angina pectoris: Secondary | ICD-10-CM

## 2011-06-17 DIAGNOSIS — I359 Nonrheumatic aortic valve disorder, unspecified: Secondary | ICD-10-CM

## 2011-06-17 DIAGNOSIS — I259 Chronic ischemic heart disease, unspecified: Secondary | ICD-10-CM

## 2011-06-17 DIAGNOSIS — I2589 Other forms of chronic ischemic heart disease: Secondary | ICD-10-CM

## 2011-06-17 DIAGNOSIS — I255 Ischemic cardiomyopathy: Secondary | ICD-10-CM

## 2011-06-17 DIAGNOSIS — I350 Nonrheumatic aortic (valve) stenosis: Secondary | ICD-10-CM

## 2011-06-17 MED ORDER — LISINOPRIL 10 MG PO TABS: 10.0000 mg | ORAL_TABLET | Freq: Every day | ORAL | Status: DC

## 2011-06-17 NOTE — Telephone Encounter (Signed)
Called in to give the correct information so we can request the films for her husbands chest X-Ray and heart Cath.   ATTN:Shannon, fax: [phone] [facility] in [place]. Done in February of this year.

## 2011-06-17 NOTE — Assessment & Plan Note (Signed)
He is status post remote stenting of the LAD. He has had angioplasty of second diagonal branch which was not very successful. He has atypical chest pain. We will still attempt to get his most recent cardiac catheterization films from [facility]. We also need to see if he has had recent stress test evaluation. I can review his cardiac catheterization films from September of 2010 as well.

## 2011-06-17 NOTE — Patient Instructions (Signed)
We will try again to get your records from [facility].  We will schedule you for an Echocardiogram.   We will start you on lisinopril 10 mg daily for blood pressure.  I will see you back in one month and check blood work.

## 2011-06-17 NOTE — Assessment & Plan Note (Signed)
There is conflicting data concerning his ejection fraction based on his prior records. Some assessments have indicated an ejection fraction as low as 30-35% whereas others have indicated his ejection fraction is normal at 55%. Will schedule him for an echocardiogram to reassess. Because of his elevated blood pressure we will start him on an ACE inhibitor today. We will recheck a basic metabolic panel in one month at which time we will see him for followup.

## 2011-06-17 NOTE — Progress Notes (Signed)
[name] [name] Date of Birth: 1951/01/21   History of Present Illness: [name] [name] is seen for followup of his coronary disease. Since his last visit in May we did obtain records from [facility] and from [facility] in [place]. These records all encompass care in 2005 in 2006 when he underwent mitral valve repair for severe mitral insufficiency. It was noted that he had a prior stent in his LAD which was still patent. We still have not received his records from his most recent cardiac catheterization in [facility]. Our records actually indicate that he was admitted to our facility in September of 2010. The patient neglected to tell as this on his last visit. This indicates that he had angioplasty of the second diagonal branch that was only mildly successful. This vessel was quite small and not felt to be amenable to stenting. His LAD stent was felt to still be patent. Patient reports that on his most recent catheterization there was concern of some narrowing both before and after the stented segment. He continues to have occasional symptoms of chest pain that are not related to exertion usually described as sharp in nature and usually last 3-4 minutes but he has had them last as long as one half day. He does not take nitroglycerin for this pain. His major complaint is that he feels very fatigued and often has to nap during the afternoon. His weight has increased by 7 pounds.  Current Outpatient Prescriptions on File Prior to Visit  Medication Sig Dispense Refill  . atorvastatin (LIPITOR) 10 MG tablet Take 10 mg by mouth daily.        . carvedilol (COREG) 25 MG tablet Take 25 mg by mouth 2 (two) times daily with a meal.        . esomeprazole (NEXIUM) 40 MG capsule Take 40 mg by mouth daily before breakfast.        . FLUoxetine (PROZAC) 40 MG capsule Take 40 mg by mouth daily.        Marland Kitchen glimepiride (AMARYL) 4 MG tablet Take 4 mg by mouth 2  (two) times daily.        . isosorbide mononitrate (IMDUR) 30 MG 24 hr tablet Take 30 mg by mouth daily.        . niacin (NIASPAN) 500 MG CR tablet Take 500 mg by mouth at bedtime.        . potassium chloride SA (K-DUR,KLOR-CON) 20 MEQ tablet Take 20 mEq by mouth daily.        . prasugrel (EFFIENT) 10 MG TABS Take 10 mg by mouth daily.        . ranolazine (RANEXA) 1000 MG SR tablet Take 1,000 mg by mouth 2 (two) times daily.        . traZODone (DESYREL) 100 MG tablet Take 100 mg by mouth at bedtime.          No Known Allergies  Past Medical History  Diagnosis Date  . Hypertension   . Hyperlipidemia   . GERD (gastroesophageal reflux disease)   . MVP (mitral valve prolapse)   . IHD (ischemic heart disease)   . Anxiety   . LV dysfunction     Past Surgical History  Procedure Date  . Cardiac defibrillator placement   . Coronary angioplasty   . Mitral valve repair   . Coronary stent placement     History  Smoking status  . Never Smoker   Smokeless tobacco  .  Not on file    History  Alcohol Use No    Family History  Problem Relation Age of Onset  . Coronary artery disease Mother   . Colon cancer Mother   . Heart attack Mother   . Heart disease Mother   . Heart failure Mother   . Hypertension Mother   . Colon cancer Sister   . Coronary artery disease Brother   . Heart disease Brother     Review of Systems: The review of systems is positive for weight Gagne.  He does have some fatigue. He is disabled. He does follow a low-salt diet. He has no history of stroke or TIA.   Physical Exam: BP 152/106  Pulse 67  Ht 6' (1.829 m)  Wt 248 lb 3.2 oz (112.583 kg)  BMI 33.66 kg/m2 He is a pleasant overweight white male in no acute distress. He is normocephalic, atraumatic. Pupils are equal round and reactive to light and accommodation. Extraocular movements are full. Oropharynx is clear. Neck is supple without JVD, adenopathy, thyromegaly, or bruits. Lungs are clear. Cardiac  exam reveals a regular rate and rhythm with a grade 2/6 holosystolic murmur heard best at the apex radiating to the left axilla. There is no S3. His defibrillator site in the left subclavicular area appears normal. He has a healed median sternotomy scar. He has marked central obesity. Femoral and pedal pulses are 2+ and symmetric. He has no significant edema. Skin is warm and dry. He is alert and oriented x3. Cranial nerves II through XII are intact. LABORATORY DATA: ECG today demonstrates normal sinus rhythm with occasional PVCs. He has occasional atrially paced beats. 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  Assessment / Plan:

## 2011-06-17 NOTE — Telephone Encounter (Signed)
Called w/ phone no at hospital in [facility]; that is same fax # that I had; will refax request for Cath CD and stress test.

## 2011-06-17 NOTE — Assessment & Plan Note (Signed)
He does have a murmur of mitral insufficiency by exam. We will followup an echocardiogram to assess the success of his mitral valve repair.

## 2011-06-17 NOTE — Assessment & Plan Note (Signed)
His blood pressure is significantly elevated today. We will start him on lisinopril 10 mg daily. Have encouraged sodium restriction and weight loss.

## 2011-06-18 ENCOUNTER — Encounter: Payer: Self-pay | Admitting: Cardiology

## 2011-06-21 ENCOUNTER — Telehealth: Payer: Self-pay | Admitting: Cardiology

## 2011-06-21 NOTE — Telephone Encounter (Signed)
Received call from patient wife asking about records.  Karrie Doffing faxed request and we still have not received.  She asked that we again fax, hospital is advising that they did not receive the request.   Carlyon Shadow [phone]  Phone:  [phone].

## 2011-06-24 ENCOUNTER — Other Ambulatory Visit: Payer: Self-pay | Admitting: Cardiology

## 2011-06-24 ENCOUNTER — Ambulatory Visit (HOSPITAL_COMMUNITY): Payer: Medicare Other | Attending: Cardiology | Admitting: Radiology

## 2011-06-24 DIAGNOSIS — I079 Rheumatic tricuspid valve disease, unspecified: Secondary | ICD-10-CM | POA: Insufficient documentation

## 2011-06-24 DIAGNOSIS — E785 Hyperlipidemia, unspecified: Secondary | ICD-10-CM | POA: Insufficient documentation

## 2011-06-24 DIAGNOSIS — I350 Nonrheumatic aortic (valve) stenosis: Secondary | ICD-10-CM

## 2011-06-24 DIAGNOSIS — I059 Rheumatic mitral valve disease, unspecified: Secondary | ICD-10-CM | POA: Insufficient documentation

## 2011-06-24 DIAGNOSIS — I2510 Atherosclerotic heart disease of native coronary artery without angina pectoris: Secondary | ICD-10-CM | POA: Insufficient documentation

## 2011-06-24 DIAGNOSIS — I10 Essential (primary) hypertension: Secondary | ICD-10-CM | POA: Insufficient documentation

## 2011-06-24 MED ORDER — LISINOPRIL 10 MG PO TABS: 10.0000 mg | ORAL_TABLET | Freq: Every day | ORAL | Status: DC

## 2011-06-24 NOTE — Telephone Encounter (Signed)
Called in needing his prescription of Lisinopril refilled at [facility]. Please call back. Patient is EPIC only.

## 2011-06-24 NOTE — Telephone Encounter (Signed)
Lm to refill Lisinopril at [facility]; called pharm because Dr. Martinique sent that in on 7/5. Pharmacist states they never received. Gave verbal order.

## 2011-06-25 ENCOUNTER — Telehealth: Payer: Self-pay | Admitting: *Deleted

## 2011-06-25 NOTE — Telephone Encounter (Signed)
Notified of Echo results. Will send copy to Dr. Lin Landsman

## 2011-06-25 NOTE — Telephone Encounter (Signed)
Message copied by Hetty Blend on Fri Jun 25, 2011  4:34 PM ------      Message from: Martinique, PETER M      Created: Fri Jun 25, 2011 12:59 PM       EF looks good at 50-55%. Good result from MV repair with only mild MR. Please report.

## 2011-08-18 ENCOUNTER — Other Ambulatory Visit: Payer: Self-pay | Admitting: *Deleted

## 2011-08-18 ENCOUNTER — Ambulatory Visit (INDEPENDENT_AMBULATORY_CARE_PROVIDER_SITE_OTHER): Payer: Medicare Other | Admitting: Cardiology

## 2011-08-18 ENCOUNTER — Other Ambulatory Visit: Payer: Medicare Other | Admitting: *Deleted

## 2011-08-18 ENCOUNTER — Encounter: Payer: Self-pay | Admitting: Cardiology

## 2011-08-18 DIAGNOSIS — I2510 Atherosclerotic heart disease of native coronary artery without angina pectoris: Secondary | ICD-10-CM

## 2011-08-18 DIAGNOSIS — Z95810 Presence of automatic (implantable) cardiac defibrillator: Secondary | ICD-10-CM

## 2011-08-18 DIAGNOSIS — Z79899 Other long term (current) drug therapy: Secondary | ICD-10-CM

## 2011-08-18 DIAGNOSIS — R0600 Dyspnea, unspecified: Secondary | ICD-10-CM

## 2011-08-18 DIAGNOSIS — R0609 Other forms of dyspnea: Secondary | ICD-10-CM

## 2011-08-18 DIAGNOSIS — R0989 Other specified symptoms and signs involving the circulatory and respiratory systems: Secondary | ICD-10-CM

## 2011-08-18 HISTORY — DX: Presence of automatic (implantable) cardiac defibrillator: Z95.810

## 2011-08-18 MED ORDER — LISINOPRIL 10 MG PO TABS: 10.0000 mg | ORAL_TABLET | Freq: Every day | ORAL | Status: DC

## 2011-08-18 NOTE — Progress Notes (Signed)
[name] [name] Date of Birth: 1951/04/01   History of Present Illness: [name] [name] is seen for followup of his coronary disease.  He continues to complain of some symptoms of intermittent chest discomfort that are nonexertional. He does get short of breath easily. He complains of fatigue. The symptoms really have not changed significantly. We previously had prescribed lisinopril but he never got this prescription filled. He does not monitor his blood pressure. We did receive a copy of his cardiac catheterization from February of 2012 in [place], [place]. His cardiac catheterization demonstrated 50% narrowing of the prior stent in the LAD. It appears that he had attempted angioplasty of a very small second diagonal branch which was not successful. This is the same branch that was angioplastied here in 2010.   Current Outpatient Prescriptions on File Prior to Visit  Medication Sig Dispense Refill  . atorvastatin (LIPITOR) 10 MG tablet Take 10 mg by mouth daily.        . carvedilol (COREG) 25 MG tablet Take 25 mg by mouth 2 (two) times daily with a meal.        . esomeprazole (NEXIUM) 40 MG capsule Take 40 mg by mouth daily before breakfast.        . FLUoxetine (PROZAC) 40 MG capsule Take 40 mg by mouth daily.        Marland Kitchen glimepiride (AMARYL) 4 MG tablet Take 4 mg by mouth 2 (two) times daily.        . isosorbide mononitrate (IMDUR) 30 MG 24 hr tablet Take 30 mg by mouth daily.        . niacin (NIASPAN) 500 MG CR tablet Take 500 mg by mouth at bedtime.        . potassium chloride SA (K-DUR,KLOR-CON) 20 MEQ tablet Take 20 mEq by mouth daily.        . prasugrel (EFFIENT) 10 MG TABS Take 10 mg by mouth daily.        . ranolazine (RANEXA) 1000 MG SR tablet Take 1,000 mg by mouth 2 (two) times daily.        . traZODone (DESYREL) 100 MG tablet Take 100 mg by mouth at bedtime.        Marland Kitchen DISCONTD: lisinopril (PRINIVIL,ZESTRIL) 10 MG tablet Take 1 tablet (10 mg total) by mouth daily.  30 tablet   5    No Known Allergies  Past Medical History  Diagnosis Date  . Hypertension   . Hyperlipidemia   . GERD (gastroesophageal reflux disease)   . MVP (mitral valve prolapse)   . IHD (ischemic heart disease)   . Anxiety   . LV dysfunction     Past Surgical History  Procedure Date  . Cardiac defibrillator placement   . Coronary angioplasty   . Mitral valve repair   . Coronary stent placement     History  Smoking status  . Never Smoker   Smokeless tobacco  . Not on file    History  Alcohol Use No    Family History  Problem Relation Age of Onset  . Coronary artery disease Mother   . Colon cancer Mother   . Heart attack Mother   . Heart disease Mother   . Heart failure Mother   . Hypertension Mother   . Colon cancer Sister   . Coronary artery disease Brother   . Heart disease Brother     Review of Systems: The review of systems is positive for weight gain.  He does have  some fatigue. He is disabled. He does follow a low-salt diet. He has no history of stroke or TIA.  He does eat a lot of sweets. All other systems are reviewed and are negative.  Physical Exam: BP 152/98  Pulse 74  Ht 5\' 11"  (1.803 m)  Wt 258 lb 9.6 oz (117.3 kg)  BMI 36.07 kg/m2 He is a pleasant overweight white male in no acute distress. He is normocephalic, atraumatic. Pupils are equal round and reactive to light and accommodation. Extraocular movements are full. Oropharynx is clear. Neck is supple without JVD, adenopathy, thyromegaly, or bruits. Lungs are clear. Cardiac exam reveals a regular rate and rhythm with a grade 2/6 holosystolic murmur heard best at the apex radiating to the left axilla. There is no S3. His defibrillator site in the left subclavicular area appears normal. He has a healed median sternotomy scar. He has marked central obesity. Femoral and pedal pulses are 2+ and symmetric. He has no significant edema. Skin is warm and dry. He is alert and oriented x3. Cranial nerves II  through XII are intact. LABORATORY DATA: ECG today demonstrates normal sinus rhythm.  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  Assessment / Plan:

## 2011-08-18 NOTE — Assessment & Plan Note (Signed)
Blood pressure is still poorly controlled. I have again called him a prescription for lisinopril 10 mg daily. We will followup again in 3 months.

## 2011-08-18 NOTE — Patient Instructions (Signed)
We will start lisinopril 10 mg daily for your blood pressure.  We will schedule for follow up in our defibrillator clinic.  I will see you again in 3 months.

## 2011-08-18 NOTE — Assessment & Plan Note (Signed)
He needs followup in our ICD clinic. He was seen by EP here when he was hospitalized in September of 2010. Need to reestablish followup.

## 2011-08-18 NOTE — Assessment & Plan Note (Signed)
His most recent echocardiogram demonstrated an ejection fraction of 50-55%. He had mild anterior wall hypokinesis. This is consistent with his most recent echocardiogram from [place]. He will continue with his beta blocker therapy and Spironolactone. We'll add an ACE inhibitor today.

## 2011-08-18 NOTE — Assessment & Plan Note (Signed)
He has had remote stenting of the LAD. He has borderline lesions in the stent. He has had failed angioplasty of the diagonal which is very small. I recommended continuing on his antianginal therapy. Unless he has a significant increase in his anginal symptoms we will plan on a nuclear stress test in February which will be one year from his last cardiac catheterization.

## 2011-08-19 ENCOUNTER — Encounter: Payer: Self-pay | Admitting: Cardiology

## 2011-09-02 ENCOUNTER — Encounter: Payer: Medicare Other | Admitting: *Deleted

## 2011-09-15 ENCOUNTER — Encounter: Payer: Self-pay | Admitting: Internal Medicine

## 2011-09-15 ENCOUNTER — Ambulatory Visit (INDEPENDENT_AMBULATORY_CARE_PROVIDER_SITE_OTHER): Payer: Medicare Other | Admitting: *Deleted

## 2011-09-15 DIAGNOSIS — I259 Chronic ischemic heart disease, unspecified: Secondary | ICD-10-CM

## 2011-09-15 LAB — ICD DEVICE OBSERVATION
AL AMPLITUDE: 3 mv
AL IMPEDENCE ICD: 475 Ohm
BAMS-0001: 160 {beats}/min
BAMS-0003: 70 {beats}/min
TOT-0007: 0
TOT-0008: 0
TOT-0009: 1
TZON-0003SLOWVT: 400 ms
TZON-0004SLOWVT: 16
TZON-0005SLOWVT: 6
TZON-0010SLOWVT: 40 ms
VENTRICULAR PACING ICD: 0 pct

## 2011-09-15 NOTE — Progress Notes (Signed)
ICD check

## 2011-09-28 ENCOUNTER — Encounter: Payer: Self-pay | Admitting: Cardiovascular Disease

## 2011-10-13 ENCOUNTER — Telehealth: Payer: Self-pay | Admitting: Cardiology

## 2011-10-13 NOTE — Telephone Encounter (Signed)
All Cardiac faxed to [facility] @ [phone]  10/13/11/km

## 2011-11-19 ENCOUNTER — Telehealth: Payer: Self-pay | Admitting: Cardiology

## 2011-11-19 ENCOUNTER — Encounter: Payer: Self-pay | Admitting: Cardiology

## 2011-11-19 ENCOUNTER — Other Ambulatory Visit: Payer: Medicare Other | Admitting: *Deleted

## 2011-11-19 ENCOUNTER — Other Ambulatory Visit (INDEPENDENT_AMBULATORY_CARE_PROVIDER_SITE_OTHER): Payer: Medicare Other | Admitting: *Deleted

## 2011-11-19 ENCOUNTER — Ambulatory Visit (INDEPENDENT_AMBULATORY_CARE_PROVIDER_SITE_OTHER): Payer: Medicare Other | Admitting: Cardiology

## 2011-11-19 VITALS — BP 152/106 | HR 76 | Ht 71.0 in | Wt 254.0 lb

## 2011-11-19 DIAGNOSIS — R0600 Dyspnea, unspecified: Secondary | ICD-10-CM

## 2011-11-19 DIAGNOSIS — R0609 Other forms of dyspnea: Secondary | ICD-10-CM

## 2011-11-19 DIAGNOSIS — I2589 Other forms of chronic ischemic heart disease: Secondary | ICD-10-CM

## 2011-11-19 DIAGNOSIS — I255 Ischemic cardiomyopathy: Secondary | ICD-10-CM

## 2011-11-19 DIAGNOSIS — I2510 Atherosclerotic heart disease of native coronary artery without angina pectoris: Secondary | ICD-10-CM

## 2011-11-19 DIAGNOSIS — E785 Hyperlipidemia, unspecified: Secondary | ICD-10-CM

## 2011-11-19 DIAGNOSIS — I259 Chronic ischemic heart disease, unspecified: Secondary | ICD-10-CM

## 2011-11-19 DIAGNOSIS — R0989 Other specified symptoms and signs involving the circulatory and respiratory systems: Secondary | ICD-10-CM

## 2011-11-19 DIAGNOSIS — I10 Essential (primary) hypertension: Secondary | ICD-10-CM

## 2011-11-19 DIAGNOSIS — Z95810 Presence of automatic (implantable) cardiac defibrillator: Secondary | ICD-10-CM

## 2011-11-19 LAB — BASIC METABOLIC PANEL
CO2: 24 mEq/L (ref 19–32)
Calcium: 8.8 mg/dL (ref 8.4–10.5)
Glucose, Bld: 228 mg/dL — ABNORMAL HIGH (ref 70–99)
Sodium: 137 mEq/L (ref 135–145)

## 2011-11-19 LAB — HEPATIC FUNCTION PANEL
Albumin: 4 g/dL (ref 3.5–5.2)
Alkaline Phosphatase: 62 U/L (ref 39–117)
Total Protein: 6.8 g/dL (ref 6.0–8.3)

## 2011-11-19 LAB — LIPID PANEL
HDL: 41.1 mg/dL (ref >39.00)
Triglycerides: 139 mg/dL (ref 0.0–149.0)

## 2011-11-19 LAB — CBC WITH DIFFERENTIAL/PLATELET
Basophils Relative: 0.7 % (ref 0.0–3.0)
Eosinophils Absolute: 0 10*3/uL (ref 0.0–0.7)
Lymphs Abs: 1.5 10*3/uL (ref 0.7–4.0)
MCHC: 34.8 g/dL (ref 30.0–36.0)
MCV: 93.9 fl (ref 78.0–100.0)
Monocytes Absolute: 0.4 10*3/uL (ref 0.1–1.0)
Neutro Abs: 3.5 10*3/uL (ref 1.4–7.7)
Neutrophils Relative %: 65.1 % (ref 43.0–77.0)
RBC: 4.46 Mil/uL (ref 4.22–5.81)

## 2011-11-19 MED ORDER — VALSARTAN 160 MG PO TABS: 160.0000 mg | ORAL_TABLET | Freq: Every day | ORAL | Status: DC

## 2011-11-19 MED ORDER — LISINOPRIL 20 MG PO TABS: 20.0000 mg | ORAL_TABLET | Freq: Every day | ORAL | Status: DC

## 2011-11-19 MED ORDER — METFORMIN HCL 500 MG PO TABS: 1000.0000 mg | ORAL_TABLET | Freq: Every day | ORAL | Status: DC

## 2011-11-19 NOTE — Assessment & Plan Note (Signed)
Now followed in ICD clinic. He has followup in January.

## 2011-11-19 NOTE — Assessment & Plan Note (Signed)
Poorly controlled. He did not take his medications today. We will increase his lisinopril to 20 mg per day and he will monitor his blood pressure closely. I'll followup again in 4 months.

## 2011-11-19 NOTE — Progress Notes (Signed)
[name] [name] Date of Birth: [birth year]   History of Present Illness: [name] [name] is seen for followup of his coronary disease.  He is generally feeling well except for some mild nausea. He has a little bit of chest pain every now and then very brief. He has not had to take any nitroglycerin. He reports that he has not taken his medications today. He had a cardiac catheterization from February of 2012 in [place], [place]. His cardiac catheterization demonstrated 50% narrowing of the prior stent in the LAD. It appears that he had attempted angioplasty of a very small second diagonal branch which was not successful. This is the same branch that was angioplastied here in 2010. He reports that he has been started on metformin for his diabetes. Even at home his blood pressure has been running high with typical diastolic readings in the mid 90s.   Current Outpatient Prescriptions on File Prior to Visit  Medication Sig Dispense Refill  . atorvastatin (LIPITOR) 10 MG tablet Take 10 mg by mouth daily.        . carvedilol (COREG) 25 MG tablet Take 25 mg by mouth 2 (two) times daily with a meal.        . esomeprazole (NEXIUM) 40 MG capsule Take 40 mg by mouth daily before breakfast.        . FLUoxetine (PROZAC) 40 MG capsule Take 40 mg by mouth daily.        Marland Kitchen glimepiride (AMARYL) 4 MG tablet Take 4 mg by mouth 2 (two) times daily.        . isosorbide mononitrate (IMDUR) 30 MG 24 hr tablet Take 30 mg by mouth daily.        . niacin (NIASPAN) 500 MG CR tablet Take 500 mg by mouth at bedtime.        . potassium chloride SA (K-DUR,KLOR-CON) 20 MEQ tablet Take 20 mEq by mouth daily.        . prasugrel (EFFIENT) 10 MG TABS Take 10 mg by mouth daily.        . ranolazine (RANEXA) 1000 MG SR tablet Take 1,000 mg by mouth 2 (two) times daily.        . traZODone (DESYREL) 100 MG tablet Take 100 mg by mouth at bedtime.          No Known Allergies  Past Medical History  Diagnosis Date  .  Hypertension   . Hyperlipidemia   . GERD (gastroesophageal reflux disease)   . MVP (mitral valve prolapse)   . IHD (ischemic heart disease)   . Anxiety   . LV dysfunction   . CAD (coronary artery disease)   . Diabetes mellitus     Past Surgical History  Procedure Date  . Cardiac defibrillator placement   . Coronary angioplasty   . Mitral valve repair   . Coronary stent placement     History  Smoking status  . Never Smoker   Smokeless tobacco  . Not on file    History  Alcohol Use No    Family History  Problem Relation Age of Onset  . Coronary artery disease Mother   . Colon cancer Mother   . Heart attack Mother   . Heart disease Mother   . Heart failure Mother   . Hypertension Mother   . Colon cancer Sister   . Coronary artery disease Brother   . Heart disease Brother     Review of Systems: As noted in history of  present illness. All other systems are reviewed and are negative.  Physical Exam: BP 152/106  Pulse 76  Ht 5\' 11"  (1.803 m)  Wt 254 lb (115.214 kg)  BMI 35.43 kg/m2 He is a pleasant overweight white male in no acute distress. He is normocephalic, atraumatic. Pupils are equal round and reactive to light and accommodation. Extraocular movements are full. Oropharynx is clear. Neck is supple without JVD, adenopathy, thyromegaly, or bruits. Lungs are clear. Cardiac exam reveals a regular rate and rhythm with a grade 2/6 holosystolic murmur heard best at the apex radiating to the left axilla. There is no S3. His defibrillator site in the left subclavicular area appears normal. He has a healed median sternotomy scar. He has marked central obesity. Femoral and pedal pulses are 2+ and symmetric. He has no significant edema. Skin is warm and dry. He is alert and oriented x3. Cranial nerves II through XII are intact. LABORATORY DATA:   Assessment / Plan:

## 2011-11-19 NOTE — Assessment & Plan Note (Signed)
Minimal symptoms of angina. He is on Ranexa, isosorbide, and a beta blocker. We'll try to maximize his blood pressure therapy. I stressed the importance of regular daily aerobic exercise. He needs to follow a heart healthy/diabetic diet.

## 2011-11-19 NOTE — Telephone Encounter (Signed)
New Msg: Pt calling regarding RX that MD called in for pt, lisinopril. Pt forgot that it makes him cough terribly. Therefore pt would like MD to call in substitute for pt. Please return pt call to discuss further.

## 2011-11-19 NOTE — Telephone Encounter (Signed)
Called stating that he forgot he can't take Lisinopril, causes severe cough. Per Dr. Martinique will change to Diovan 160 mg. Sent to pharmacy.

## 2011-11-19 NOTE — Assessment & Plan Note (Signed)
We will follow complete lab work today including fasting chemistries, lipid panel, CBC, and BNP.

## 2011-11-19 NOTE — Patient Instructions (Signed)
We will increase your lisinopril to 20 mg daily for blood pressure.  Watch your blood pressure at home. We would like to see the systolic pressure (top number) less than [id number] and the diastolic pressure (bottom number) less than 85. If your reading stay high let me know so we can adjust your medication.  We will call with the results of your lab work today.  You need to get aerobic exercise daily.  I will see you back in 4 months.

## 2011-11-19 NOTE — Assessment & Plan Note (Signed)
His last ejection fraction was 50-55%. He has no evidence of volume overload. We will increase his ACE inhibitor therapy. He'll remain on carvedilol. We need to stress the importance of blood pressure control.

## 2011-11-24 ENCOUNTER — Telehealth: Payer: Self-pay | Admitting: *Deleted

## 2011-11-24 NOTE — Telephone Encounter (Signed)
Message copied by Hetty Blend on Wed Nov 24, 2011  5:11 PM ------      Message from: Martinique, PETER M      Created: Fri Nov 19, 2011  5:21 PM       Chemistries are normal except for elevated blood sugar.      Cbc is normal      bnp is normal      LDL has increased from before. Recommend LDL< 70. I would increase lipitor to 20 mg daily.      Collier Salina Martinique

## 2011-11-24 NOTE — Telephone Encounter (Signed)
Notified of lab results. Will send copy to Dr. Lin Landsman. States he has enough Lipitor for a couple of weeks. Will call us when needs refill.

## 2011-12-27 ENCOUNTER — Encounter: Payer: Self-pay | Admitting: Internal Medicine

## 2011-12-27 ENCOUNTER — Ambulatory Visit (INDEPENDENT_AMBULATORY_CARE_PROVIDER_SITE_OTHER)
Admission: RE | Admit: 2011-12-27 | Discharge: 2011-12-27 | Disposition: A | Discharge status: Home / Self Care | Payer: Medicare Other | Source: Ambulatory Visit | Attending: Internal Medicine | Admitting: Internal Medicine

## 2011-12-27 ENCOUNTER — Ambulatory Visit (INDEPENDENT_AMBULATORY_CARE_PROVIDER_SITE_OTHER): Payer: Medicare Other | Admitting: Internal Medicine

## 2011-12-27 DIAGNOSIS — Z95810 Presence of automatic (implantable) cardiac defibrillator: Secondary | ICD-10-CM

## 2011-12-27 DIAGNOSIS — I255 Ischemic cardiomyopathy: Secondary | ICD-10-CM

## 2011-12-27 DIAGNOSIS — I2589 Other forms of chronic ischemic heart disease: Secondary | ICD-10-CM

## 2011-12-27 LAB — ICD DEVICE OBSERVATION
AL IMPEDENCE ICD: 500 Ohm
AL THRESHOLD: 0.25 V
CHARGE TIME: 13.4 s
DEVICE MODEL ICD: 345070
HV IMPEDENCE: 37 Ohm
RV LEAD AMPLITUDE: 12.1 mv
RV LEAD IMPEDENCE ICD: 380 Ohm
TOT-0009: 1
TOT-0010: 46
TZON-0004SLOWVT: 16
TZON-0005SLOWVT: 6
TZST-0001SLOWVT: 1
TZST-0002SLOWVT: NEGATIVE

## 2011-12-27 NOTE — Assessment & Plan Note (Signed)
Stable canadian class II angina He has no CHF on exam   Normal ICD function See Claudia Desanctis Art report Will need to follow his Atlas battery closely. Will obtain CXR today to evaluate his 7001 RV lead.  He has been given a letter today outlining the RV lead recall. No changes today   He has occasional dizziness.  He is mildly orthostatic by BP today.  I have encouraged adequate PO hydration and follow-up with Dr Martinique if not improved.

## 2011-12-27 NOTE — Progress Notes (Signed)
PCP:  REDDING Angelique Blonder., MD Primary Cardiologist:  Dr Martinique  The patient presents today for routine electrophysiology followup.  Since last being seen by Dr Caryl Comes in our clinic, the patient reports doing reasonably well.   He has stable angina for which he is being managed by Dr Martinique.  Today, he denies symptoms of palpitations, chest pain, shortness of breath,lower extremity edema,  presyncope, syncope, or neurologic sequela.  The patient feels that he is tolerating medications without difficulties and is otherwise without complaint today.   Past Medical History  Diagnosis Date  . Hypertension   . Hyperlipidemia   . GERD (gastroesophageal reflux disease)   . MVP (mitral valve prolapse)   . IHD (ischemic heart disease)   . Anxiety   . LV dysfunction   . CAD (coronary artery disease)   . Diabetes mellitus    Past Surgical History  Procedure Date  . Cardiac defibrillator placement     implanted in [place], has a 7001 RV lead and a SJM Atlas ICD followed by Dr Caryl Comes  . Coronary angioplasty   . Mitral valve repair   . Coronary stent placement     Current Outpatient Prescriptions  Medication Sig Dispense Refill  . atorvastatin (LIPITOR) 20 MG tablet Take 20 mg by mouth daily.        . carvedilol (COREG) 25 MG tablet Take 25 mg by mouth 2 (two) times daily with a meal.        . esomeprazole (NEXIUM) 40 MG capsule Take 40 mg by mouth daily before breakfast.        . FLUoxetine (PROZAC) 40 MG capsule Take 40 mg by mouth daily.        Marland Kitchen glimepiride (AMARYL) 4 MG tablet Take 4 mg by mouth 2 (two) times daily.        . isosorbide mononitrate (IMDUR) 30 MG 24 hr tablet Take 30 mg by mouth daily.        . metFORMIN (GLUCOPHAGE) 500 MG tablet Take 2 tablets (1,000 mg total) by mouth daily with breakfast.      . niacin (NIASPAN) 500 MG CR tablet Take 500 mg by mouth at bedtime.        . potassium chloride SA (K-DUR,KLOR-CON) 20 MEQ tablet Take 20 mEq by mouth daily.        . prasugrel  (EFFIENT) 10 MG TABS Take 10 mg by mouth daily.        . ranolazine (RANEXA) 1000 MG SR tablet Take 1,000 mg by mouth 2 (two) times daily.        . traZODone (DESYREL) 100 MG tablet Take 100 mg by mouth at bedtime.        . valsartan (DIOVAN) 160 MG tablet Take 1 tablet (160 mg total) by mouth daily.  30 tablet  11    Allergies  Allergen Reactions  . Lisinopril Cough    History   Social History  . Marital Status: Married    Spouse Name: N/A    Number of Children: 8  . Years of Education: N/A   Occupational History  . lawmaker     disabled   Social History Main Topics  . Smoking status: Never Smoker   . Smokeless tobacco: Not on file  . Alcohol Use: No  . Drug Use: No  . Sexually Active:    Other Topics Concern  . Not on file   Social History Narrative  . No narrative on file    Family  History  Problem Relation Age of Onset  . Coronary artery disease Mother   . Colon cancer Mother   . Heart attack Mother   . Heart disease Mother   . Heart failure Mother   . Hypertension Mother   . Colon cancer Sister   . Coronary artery disease Brother   . Heart disease Brother    Physical Exam: Filed Vitals:   12/27/11 0915 12/27/11 0916 12/27/11 0917 12/27/11 0918  BP: 113/74 102/68 90/62 121/83  Pulse:  88 94 88  Height:      Weight:    248 lb (112.492 kg)    GEN- The patient is well appearing, alert and oriented x 3 today.   Head- normocephalic, atraumatic Eyes-  Sclera clear, conjunctiva pink Ears- hearing intact Oropharynx- clear Neck- supple, no JVP Lymph- no cervical lymphadenopathy Lungs- Clear to ausculation bilaterally, normal work of breathing Chest- ICD pocket is well healed Heart- Regular rate and rhythm, no murmurs, rubs or gallops, PMI not laterally displaced GI- soft, NT, ND, + BS Extremities- no clubbing, cyanosis, or edema  ICD interrogation- reviewed in detail today,  See PACEART report  Assessment and Plan:

## 2011-12-27 NOTE — Patient Instructions (Signed)
Your physician recommends that you schedule a follow-up appointment in: 3 months with device clinic  A chest x-ray takes a picture of the organs and structures inside the chest, including the heart, lungs, and blood vessels. This test can show several things, including, whether the heart is enlarges; whether fluid is building up in the lungs; and whether pacemaker / defibrillator leads are still in place.

## 2012-02-17 ENCOUNTER — Inpatient Hospital Stay (HOSPITAL_COMMUNITY)
Admission: AD | Admit: 2012-02-17 | Discharge: 2012-02-19 | DRG: 287 | Disposition: A | Discharge status: Home / Self Care | Payer: Medicare Other | Source: Other Acute Inpatient Hospital | Attending: Cardiology | Admitting: Cardiology

## 2012-02-17 ENCOUNTER — Other Ambulatory Visit: Payer: Self-pay

## 2012-02-17 ENCOUNTER — Encounter (HOSPITAL_COMMUNITY): Payer: Self-pay | Admitting: *Deleted

## 2012-02-17 ENCOUNTER — Encounter (HOSPITAL_COMMUNITY)
Admission: AD | Disposition: A | Discharge status: Home / Self Care | Payer: Self-pay | Source: Other Acute Inpatient Hospital | Attending: Cardiology

## 2012-02-17 ENCOUNTER — Ambulatory Visit (HOSPITAL_COMMUNITY): Admit: 2012-02-17 | Payer: Self-pay | Admitting: Cardiovascular Disease

## 2012-02-17 DIAGNOSIS — I10 Essential (primary) hypertension: Secondary | ICD-10-CM | POA: Insufficient documentation

## 2012-02-17 DIAGNOSIS — Z888 Allergy status to other drugs, medicaments and biological substances status: Secondary | ICD-10-CM

## 2012-02-17 DIAGNOSIS — E785 Hyperlipidemia, unspecified: Secondary | ICD-10-CM | POA: Diagnosis present

## 2012-02-17 DIAGNOSIS — I259 Chronic ischemic heart disease, unspecified: Secondary | ICD-10-CM

## 2012-02-17 DIAGNOSIS — Z79899 Other long term (current) drug therapy: Secondary | ICD-10-CM

## 2012-02-17 DIAGNOSIS — I5022 Chronic systolic (congestive) heart failure: Secondary | ICD-10-CM | POA: Diagnosis present

## 2012-02-17 DIAGNOSIS — I2510 Atherosclerotic heart disease of native coronary artery without angina pectoris: Secondary | ICD-10-CM

## 2012-02-17 DIAGNOSIS — K219 Gastro-esophageal reflux disease without esophagitis: Secondary | ICD-10-CM | POA: Diagnosis present

## 2012-02-17 DIAGNOSIS — IMO0001 Reserved for inherently not codable concepts without codable children: Secondary | ICD-10-CM | POA: Diagnosis present

## 2012-02-17 DIAGNOSIS — I2589 Other forms of chronic ischemic heart disease: Secondary | ICD-10-CM | POA: Diagnosis present

## 2012-02-17 DIAGNOSIS — I4891 Unspecified atrial fibrillation: Secondary | ICD-10-CM | POA: Diagnosis present

## 2012-02-17 DIAGNOSIS — I200 Unstable angina: Secondary | ICD-10-CM

## 2012-02-17 DIAGNOSIS — F419 Anxiety disorder, unspecified: Secondary | ICD-10-CM | POA: Insufficient documentation

## 2012-02-17 DIAGNOSIS — Z7982 Long term (current) use of aspirin: Secondary | ICD-10-CM

## 2012-02-17 DIAGNOSIS — I509 Heart failure, unspecified: Secondary | ICD-10-CM | POA: Diagnosis present

## 2012-02-17 DIAGNOSIS — Z9861 Coronary angioplasty status: Secondary | ICD-10-CM

## 2012-02-17 DIAGNOSIS — F411 Generalized anxiety disorder: Secondary | ICD-10-CM | POA: Diagnosis present

## 2012-02-17 DIAGNOSIS — Z95810 Presence of automatic (implantable) cardiac defibrillator: Secondary | ICD-10-CM | POA: Insufficient documentation

## 2012-02-17 HISTORY — DX: Ischemic cardiomyopathy: I25.5

## 2012-02-17 HISTORY — PX: LEFT HEART CATHETERIZATION WITH CORONARY ANGIOGRAM: SHX5451

## 2012-02-17 LAB — CBC
Hemoglobin: 13.3 g/dL (ref 13.0–17.0)
MCH: 31.7 pg (ref 26.0–34.0)
Platelets: 185 10*3/uL (ref 150–400)
RBC: 4.19 MIL/uL — ABNORMAL LOW (ref 4.22–5.81)
WBC: 8.7 10*3/uL (ref 4.0–10.5)

## 2012-02-17 LAB — MRSA PCR SCREENING: MRSA by PCR: NEGATIVE

## 2012-02-17 LAB — CARDIAC PANEL(CRET KIN+CKTOT+MB+TROPI)
Relative Index: INVALID (ref 0.0–2.5)
Total CK: 71 U/L (ref 7–232)
Troponin I: <0.3 ng/mL (ref <0.30)
Troponin I: <0.3 ng/mL (ref <0.30)

## 2012-02-17 LAB — GLUCOSE, CAPILLARY
Glucose-Capillary: 246 mg/dL — ABNORMAL HIGH (ref 70–99)
Glucose-Capillary: 222 mg/dL — ABNORMAL HIGH (ref 70–99)
Glucose-Capillary: 220 mg/dL — ABNORMAL HIGH (ref 70–99)
Glucose-Capillary: 320 mg/dL — ABNORMAL HIGH (ref 70–99)

## 2012-02-17 LAB — HEMOGLOBIN A1C: Mean Plasma Glucose: 260 mg/dL — ABNORMAL HIGH (ref <117)

## 2012-02-17 LAB — CREATININE, SERUM
Creatinine, Ser: 1.13 mg/dL (ref 0.50–1.35)
GFR calc Af Amer: 79 mL/min — ABNORMAL LOW (ref >90)
GFR calc non Af Amer: 68 mL/min — ABNORMAL LOW (ref >90)

## 2012-02-17 SURGERY — LEFT HEART CATHETERIZATION WITH CORONARY ANGIOGRAM
Anesthesia: LOCAL

## 2012-02-17 MED ORDER — ACETAMINOPHEN 325 MG PO TABS: 650.0000 mg | ORAL_TABLET | ORAL | Status: DC | PRN

## 2012-02-17 MED ORDER — SODIUM CHLORIDE 0.9 % IV SOLN: 250.0000 mL | INTRAVENOUS | Status: DC | PRN

## 2012-02-17 MED ORDER — MIDAZOLAM HCL 2 MG/2ML IJ SOLN
INTRAMUSCULAR | Status: AC
  Filled 2012-02-17: qty 2

## 2012-02-17 MED ORDER — FLUOXETINE HCL 20 MG PO CAPS
40.0000 mg | ORAL_CAPSULE | Freq: Every day | ORAL | Status: DC
  Administered 2012-02-17 – 2012-02-19 (×3): 40 mg via ORAL
  Filled 2012-02-17 (×3): qty 2

## 2012-02-17 MED ORDER — POTASSIUM CHLORIDE CRYS ER 20 MEQ PO TBCR
20.0000 meq | EXTENDED_RELEASE_TABLET | Freq: Every day | ORAL | Status: DC
  Administered 2012-02-17 – 2012-02-19 (×3): 20 meq via ORAL
  Filled 2012-02-17 (×3): qty 1

## 2012-02-17 MED ORDER — ASPIRIN EC 81 MG PO TBEC
81.0000 mg | DELAYED_RELEASE_TABLET | Freq: Every day | ORAL | Status: DC
  Administered 2012-02-18 – 2012-02-19 (×2): 81 mg via ORAL
  Filled 2012-02-17 (×2): qty 1

## 2012-02-17 MED ORDER — TRAZODONE HCL 100 MG PO TABS
100.0000 mg | ORAL_TABLET | Freq: Every day | ORAL | Status: DC
  Administered 2012-02-17: 100 mg via ORAL
  Filled 2012-02-17 (×3): qty 1

## 2012-02-17 MED ORDER — NITROGLYCERIN 0.4 MG SL SUBL: 0.4000 mg | SUBLINGUAL_TABLET | SUBLINGUAL | Status: DC | PRN

## 2012-02-17 MED ORDER — ROSUVASTATIN CALCIUM 20 MG PO TABS
20.0000 mg | ORAL_TABLET | Freq: Every day | ORAL | Status: DC
  Administered 2012-02-18: 20 mg via ORAL
  Filled 2012-02-17 (×3): qty 1

## 2012-02-17 MED ORDER — DIAZEPAM 5 MG PO TABS
5.0000 mg | ORAL_TABLET | ORAL | Status: AC
  Administered 2012-02-17: 5 mg via ORAL
  Filled 2012-02-17: qty 1

## 2012-02-17 MED ORDER — RANOLAZINE ER 500 MG PO TB12
1000.0000 mg | ORAL_TABLET | Freq: Two times a day (BID) | ORAL | Status: DC
  Administered 2012-02-17 – 2012-02-19 (×4): 1000 mg via ORAL
  Filled 2012-02-17 (×7): qty 2

## 2012-02-17 MED ORDER — PANTOPRAZOLE SODIUM 40 MG PO TBEC
80.0000 mg | DELAYED_RELEASE_TABLET | Freq: Every day | ORAL | Status: DC
  Administered 2012-02-17 – 2012-02-19 (×3): 80 mg via ORAL
  Filled 2012-02-17 (×4): qty 1

## 2012-02-17 MED ORDER — CARVEDILOL 25 MG PO TABS
25.0000 mg | ORAL_TABLET | Freq: Two times a day (BID) | ORAL | Status: DC
  Administered 2012-02-17 – 2012-02-19 (×4): 25 mg via ORAL
  Filled 2012-02-17 (×6): qty 1

## 2012-02-17 MED ORDER — SODIUM CHLORIDE 0.9 % IJ SOLN
3.0000 mL | INTRAMUSCULAR | Status: DC | PRN
  Administered 2012-02-18: 3 mL via INTRAVENOUS
  Administered 2012-02-19: 10:00:00 via INTRAVENOUS

## 2012-02-17 MED ORDER — SODIUM CHLORIDE 0.9 % IV SOLN: 1.0000 mL/kg/h | INTRAVENOUS | Status: AC

## 2012-02-17 MED ORDER — ONDANSETRON HCL 4 MG/2ML IJ SOLN: 4.0000 mg | Freq: Four times a day (QID) | INTRAMUSCULAR | Status: DC | PRN

## 2012-02-17 MED ORDER — FLUOXETINE HCL 40 MG PO CAPS: 40.0000 mg | ORAL_CAPSULE | Freq: Every day | ORAL | Status: DC

## 2012-02-17 MED ORDER — INSULIN ASPART 100 UNIT/ML ~~LOC~~ SOLN
0.0000 [IU] | Freq: Three times a day (TID) | SUBCUTANEOUS | Status: DC
  Administered 2012-02-18: 3 [IU] via SUBCUTANEOUS
  Administered 2012-02-18: 5 [IU] via SUBCUTANEOUS
  Administered 2012-02-18: 8 [IU] via SUBCUTANEOUS
  Administered 2012-02-19 (×2): 5 [IU] via SUBCUTANEOUS
  Filled 2012-02-17: qty 3

## 2012-02-17 MED ORDER — HEPARIN SODIUM (PORCINE) 5000 UNIT/ML IJ SOLN
5000.0000 [IU] | Freq: Three times a day (TID) | INTRAMUSCULAR | Status: DC
  Administered 2012-02-17 – 2012-02-19 (×5): 5000 [IU] via SUBCUTANEOUS
  Filled 2012-02-17 (×8): qty 1

## 2012-02-17 MED ORDER — PRASUGREL HCL 10 MG PO TABS
10.0000 mg | ORAL_TABLET | Freq: Every day | ORAL | Status: DC
  Administered 2012-02-17 – 2012-02-19 (×3): 10 mg via ORAL
  Filled 2012-02-17 (×3): qty 1

## 2012-02-17 MED ORDER — NITROGLYCERIN IN D5W 200-5 MCG/ML-% IV SOLN
3.0000 ug/min | INTRAVENOUS | Status: DC
  Administered 2012-02-17: 25 ug/min via INTRAVENOUS

## 2012-02-17 MED ORDER — INSULIN GLARGINE 100 UNIT/ML ~~LOC~~ SOLN
10.0000 [IU] | Freq: Every day | SUBCUTANEOUS | Status: DC
  Administered 2012-02-18 – 2012-02-19 (×2): 10 [IU] via SUBCUTANEOUS
  Filled 2012-02-17: qty 3

## 2012-02-17 MED ORDER — SODIUM CHLORIDE 0.9 % IJ SOLN
3.0000 mL | Freq: Two times a day (BID) | INTRAMUSCULAR | Status: DC
  Administered 2012-02-18 – 2012-02-19 (×2): 3 mL via INTRAVENOUS

## 2012-02-17 MED ORDER — LIDOCAINE HCL (PF) 1 % IJ SOLN
INTRAMUSCULAR | Status: AC
  Filled 2012-02-17: qty 30

## 2012-02-17 MED ORDER — ASPIRIN 81 MG PO CHEW: 324.0000 mg | CHEWABLE_TABLET | ORAL | Status: DC

## 2012-02-17 MED ORDER — RANOLAZINE ER 500 MG PO TB12: 1000.0000 mg | ORAL_TABLET | Freq: Two times a day (BID) | ORAL | Status: DC

## 2012-02-17 MED ORDER — LIVING WELL WITH DIABETES BOOK
Freq: Once | Status: AC
  Administered 2012-02-17: 17:00:00
  Filled 2012-02-17: qty 1

## 2012-02-17 MED ORDER — HEPARIN SODIUM (PORCINE) 1000 UNIT/ML IJ SOLN
INTRAMUSCULAR | Status: AC
  Filled 2012-02-17: qty 1

## 2012-02-17 MED ORDER — SODIUM CHLORIDE 0.9 % IV SOLN
1.0000 mL/kg/h | INTRAVENOUS | Status: DC
  Administered 2012-02-17: 1 mL/kg/h via INTRAVENOUS

## 2012-02-17 MED ORDER — ISOSORBIDE MONONITRATE ER 30 MG PO TB24
30.0000 mg | ORAL_TABLET | Freq: Every day | ORAL | Status: DC
  Administered 2012-02-18 – 2012-02-19 (×2): 30 mg via ORAL
  Filled 2012-02-17 (×3): qty 1

## 2012-02-17 MED ORDER — VERAPAMIL HCL 2.5 MG/ML IV SOLN
INTRAVENOUS | Status: AC
  Filled 2012-02-17: qty 2

## 2012-02-17 MED ORDER — NIACIN ER (ANTIHYPERLIPIDEMIC) 500 MG PO TBCR
500.0000 mg | EXTENDED_RELEASE_TABLET | Freq: Every day | ORAL | Status: DC
  Administered 2012-02-17 – 2012-02-18 (×2): 500 mg via ORAL
  Filled 2012-02-17 (×3): qty 1

## 2012-02-17 MED ORDER — HEPARIN (PORCINE) IN NACL 100-0.45 UNIT/ML-% IJ SOLN
1300.0000 [IU]/h | INTRAMUSCULAR | Status: DC
  Administered 2012-02-17: 1300 [IU]/h via INTRAVENOUS
  Filled 2012-02-17: qty 250

## 2012-02-17 MED ORDER — IRBESARTAN 150 MG PO TABS
150.0000 mg | ORAL_TABLET | Freq: Every day | ORAL | Status: DC
  Administered 2012-02-17 – 2012-02-19 (×2): 150 mg via ORAL
  Filled 2012-02-17 (×3): qty 1

## 2012-02-17 MED ORDER — GLIMEPIRIDE 4 MG PO TABS
4.0000 mg | ORAL_TABLET | Freq: Two times a day (BID) | ORAL | Status: DC
  Administered 2012-02-18 – 2012-02-19 (×3): 4 mg via ORAL
  Filled 2012-02-17 (×6): qty 1

## 2012-02-17 MED ORDER — HEPARIN (PORCINE) IN NACL 2-0.9 UNIT/ML-% IJ SOLN
INTRAMUSCULAR | Status: AC
  Filled 2012-02-17: qty 2000

## 2012-02-17 MED ORDER — NITROGLYCERIN 0.2 MG/ML ON CALL CATH LAB
INTRAVENOUS | Status: AC
  Filled 2012-02-17: qty 1

## 2012-02-17 MED ORDER — ZOLPIDEM TARTRATE 5 MG PO TABS: 5.0000 mg | ORAL_TABLET | Freq: Every evening | ORAL | Status: DC | PRN

## 2012-02-17 MED ORDER — ONDANSETRON HCL 4 MG/2ML IJ SOLN
INTRAMUSCULAR | Status: AC
  Filled 2012-02-17: qty 2

## 2012-02-17 NOTE — Progress Notes (Signed)
ANTICOAGULATION CONSULT NOTE - Initial Consult  Pharmacy Consult for heparin Indication: chest pain/ACS  Allergies  Allergen Reactions  . Lisinopril Cough    Patient Measurements: Height: 5\' 11"  (180.3 cm) Weight: 243 lb (110.224 kg) IBW/kg (Calculated) : 75.3    Vital Signs: Temp: 97.5 F (36.4 C) (03/07 1315) Temp src: Oral (03/07 1315) BP: 121/93 mmHg (03/07 1315) Pulse Rate: 76  (03/07 1315)  Labs: No results found for this basename: HGB:2,HCT:3,PLT:3,APTT:3,LABPROT:3,INR:3,HEPARINUNFRC:3,CREATININE:3,CKTOTAL:3,CKMB:3,TROPONINI:3 in the last 72 hours Estimated Creatinine Clearance: 80.6 ml/min (by C-G formula based on Cr of 1.2).  Medical History: Past Medical History  Diagnosis Date  . Hypertension   . Hyperlipidemia   . GERD (gastroesophageal reflux disease)   . MVP (mitral valve prolapse)     s/p MV annuloplasty at [facility] 2004  . Ischemic cardiomyopathy     s/p St. [name] (Atlas) ICD implanted in [place] 2007  . Anxiety   . Systolic CHF     EF [id number] by echo 2012  . CAD (coronary artery disease)     s/p Stent to LAD '00, PTCA 2nd diagonal '10  . Diabetes mellitus   . PAF (paroxysmal atrial fibrillation)     noted on ICD interrogation '10    Medications:  Prescriptions prior to admission  Medication Sig Dispense Refill  . atorvastatin (LIPITOR) 20 MG tablet Take 20 mg by mouth daily.        . carvedilol (COREG) 25 MG tablet Take 25 mg by mouth 2 (two) times daily with a meal.        . esomeprazole (NEXIUM) 40 MG capsule Take 40 mg by mouth daily before breakfast.        . FLUoxetine (PROZAC) 40 MG capsule Take 40 mg by mouth daily.        Marland Kitchen glimepiride (AMARYL) 4 MG tablet Take 4 mg by mouth 2 (two) times daily.        . isosorbide mononitrate (IMDUR) 30 MG 24 hr tablet Take 30 mg by mouth daily.        . metFORMIN (GLUCOPHAGE) 500 MG tablet Take 2 tablets (1,000 mg total) by mouth daily with breakfast.      . niacin (NIASPAN) 500 MG CR tablet Take  500 mg by mouth at bedtime.        . potassium chloride SA (K-DUR,KLOR-CON) 20 MEQ tablet Take 20 mEq by mouth daily.        . prasugrel (EFFIENT) 10 MG TABS Take 10 mg by mouth daily.        . ranolazine (RANEXA) 1000 MG SR tablet Take 1,000 mg by mouth 2 (two) times daily.        . traZODone (DESYREL) 100 MG tablet Take 100 mg by mouth at bedtime.        . valsartan (DIOVAN) 160 MG tablet Take 1 tablet (160 mg total) by mouth daily.  30 tablet  11    Assessment: 61 year old male transferred from [facility] for further evaluation and treatment after ST changes on EKG. Patient does have significant history for  CAD/PCI currently on prasugrel pta. Patient was started on IV heparin at [name] and will be going for [facility] when cath bed is available. Will continue heparin at current rate. Unless cath is cancelled, no heparin level is indicated..   Goal of Therapy:  Heparin level 0.3-0.7 units/ml   Plan:  Continue heparin at 1300 units/hr Follow up after cath  Georgina Peer 02/17/2012,3:36 PM

## 2012-02-17 NOTE — Interval H&P Note (Signed)
History and Physical Interval Note:  02/17/2012 5:07 PM  [name] [name]  has presented today for surgery, with the diagnosis of cp  The various methods of treatment have been discussed with the patient and family. After consideration of risks, benefits and other options for treatment, the patient has consented to  Procedure(s) (LRB): LEFT HEART CATHETERIZATION WITH CORONARY ANGIOGRAM (N/A) and POSSIBLE PERCUTANEOUS CORONARY ANGIOPLASTY as a surgical intervention .  The patients' history has been reviewed, patient examined, no change in status, stable for surgery.  I have reviewed the patients' chart and labs.  Questions were answered to the patient's satisfaction.     [name]

## 2012-02-17 NOTE — H&P (Signed)
History and Physical  Patient ID: BRAXTAN LACAP MRN: [id number], SOB: 10-12-1951 61 y.o. Date of Encounter: 02/17/2012, 3:14 PM  Primary Physician: Angelina Sheriff., MD Primary Cardiologist: Dr. Ronna Polio  Chief Complaint: Chest Pain  HPI: 61 y.o. male w/ PMHx significant for HTN, HLD, DMII, Systolic CHF, ICM s/p ICD, PAF, and CAD s/p PCI to LAD '00 & PTCA 2nd Diagonal '10 who presented to his PCP today for chest pain and was sent to [facility] ED for EKG changes and subsequently transferred to [facility] on 02/17/2012 for further evaluation and treatment.  He has chronic angina but reports that at the end of January/beginning of February he has had decreased energy and more nausea a/w his chest pain. The chest pain has progressively worsened and occurred with exertion and at rest and a/w sob, diaphoresis, nausea, and dizziness. He reports having increased dizziness with walking and at rest. Denies syncope, ICD firing, recent illness, fever, chills, change in bowels or bladder. He has occasional orthopnea and ankle edema. Over the last few days he has felt more weak and developed a productive cough so he presented to his PCP where his EKG was noted to have acute changes from prior EKG. He went to [facility] where his EKG showed ST down slopping & TWI in I, aVL, V5-6 more pronounced than EKG in Mar 28, 2011. First set of cardiac enzymes was normal, Electrolytes and CBC WNL, CXR w/o acute cardiopulmonary abnormalities. He was transferred to [facility] for further evaluation and treatment. He reports having blood sugars in the 350-600 range for quite some time and admits to dietary indiscretion. He says he takes all his medications, but can't name them or verify which ones he is taking when listed to him.   He has a prior cardiac history including MVP with annuloplasty in '04, ICM s/p St. [name]'s ICD in '07 for EF < 30%, stenting of his LAD in ? Mar 28, 1999 or 03-27-04, PTCA to  2nd diagonal in '10 and per Dr. Doug Sou note he had a cardiac catheterization in Feb '12 in [place] that demonstrated 50% narrowing of the prior stent in the LAD and had attempted angioplasty of a very small second diagonal branch which was not successful. Last echo in '12 showed mild LVH, EF 50-55%, hypokinesis of the inferior wall, mild MR, and mod-severe LA dilation. There is documentation of PAF by ICD interrogation in '10 by Dr. Caryl Comes who mentioned he would likely benefit from anticoagulation with coumadin or Pradaxa, however, I don't see any further mention of this in subsequent notes and he is not on either. There is also documentation in a discharge summary in '10 that he needs lifelong therapy with ASA and Plavix.  Upon arrival to [facility] his chest pain is at a 1/10 with NTG and Heparin drips infusing. He is hemodynamically stable with EKG showing NSR 73bpm, ST down slopping & TWI in I, aVL, V5-6 more pronounced than EKG in Mar 28, 2011.   Past Medical History  Diagnosis Date  . Hypertension   . Hyperlipidemia   . GERD (gastroesophageal reflux disease)   . MVP (mitral valve prolapse)     s/p MV annuloplasty at [facility] 03-28-03  . Ischemic cardiomyopathy     s/p St. [name] (Atlas) ICD implanted in [place] 03-27-06  . Anxiety   . Systolic CHF     EF [id number] by echo 03-28-2011  . CAD (coronary artery disease)     s/p Stent to LAD '00,  PTCA 2nd diagonal '10  . Diabetes mellitus   . PAF (paroxysmal atrial fibrillation)     noted on ICD interrogation '10    06/24/11 - 2D Echocardiogram Study Conclusions:   - Left ventricle: The EF is 50-55%. There is hypokinesis of the inferior wall. The cavity size was normal. Wall thickness was increased in a pattern of moderate LVH.   - Mitral valve: The mitral valve repair is working well. There is mild MR.   - Left atrium: The atrium was moderately to severely dilated.   - Right ventricle: Pacer wire or catheter noted in right ventricle.   - Right atrium: The  atrium was mildly dilated.  08/25/09 - Cardiac Catheterization Hemodynamics:  LV 175/21, AO 174/92.   Coronaries:   Left main was normal.   The LAD in the mid segment had 40% stenosis at the takeoff of the first and second diagonal.  First diagonal was large with mid 30- 40% stenosis.  Second diagonal was moderate sized with mid 90% stenosis.  There was also ostial 40% stenosis.  Circumflex in the AV groove had  diffuse luminal irregularities.  Mid obtuse marginal was moderate sized with diffuse luminal irregularities.   The right coronary artery was very large.  It was a dominant vessel.  There were mild luminal  irregularities.  Acute marginal was large with luminal irregularities. The PDA was moderate sized with mid 50% stenosis.   Left ventriculogram: The left ventriculogram was obtained in the RAO projection.  It was  somewhat difficult to estimate the ejection fraction secondary to ventricular ectopy.  However, it seemed to be only mildly to moderately reduced with an EF of 45% global hypokinesis.  CONCLUSION:  High-grade branch vessel coronary artery disease with widely patent stent in the left anterior descending.  Other vessels have diffuse mild plaquing.  The ejection fraction appears to be only mildly reduced by this study though echo is indicated 08/26/09 -  PTCA of the first diagonal FINAL CONCLUSIONS:  PTCA of the first diagonal branch with a suboptimal result due to vessel recoil.  PLAN:  The patient should take aspirin lifelong and Plavix for 30 days.  Surgical History:  Past Surgical History  Procedure Date  . Cardiac defibrillator placement     implanted in [place], has a 7001 RV lead and a SJM Atlas ICD followed by Dr Caryl Comes  . Coronary angioplasty   . Mitral valve repair   . Coronary stent placement      Home Meds: Medication Sig  atorvastatin (LIPITOR) 20 MG tablet Take 20 mg by mouth daily.    carvedilol (COREG) 25 MG tablet Take 25 mg by mouth 2 (two) times daily with  a meal.    esomeprazole (NEXIUM) 40 MG capsule Take 40 mg by mouth daily before breakfast.    FLUoxetine (PROZAC) 40 MG capsule Take 40 mg by mouth daily.    glimepiride (AMARYL) 4 MG tablet Take 4 mg by mouth 2 (two) times daily.    isosorbide mononitrate (IMDUR) 30 MG 24 hr tablet Take 30 mg by mouth daily.    metFORMIN (GLUCOPHAGE) 500 MG tablet Take 2 tablets (1,000 mg total) by mouth daily with breakfast.  niacin (NIASPAN) 500 MG CR tablet Take 500 mg by mouth at bedtime.    potassium chloride SA (K-DUR,KLOR-CON) 20 MEQ tablet Take 20 mEq by mouth daily.    prasugrel (EFFIENT) 10 MG TABS Take 10 mg by mouth daily.    ranolazine (RANEXA) 1000 MG SR  tablet Take 1,000 mg by mouth 2 (two) times daily.    traZODone (DESYREL) 100 MG tablet Take 100 mg by mouth at bedtime.    valsartan (DIOVAN) 160 MG tablet Take 1 tablet (160 mg total) by mouth daily.   Inpatient Meds:  . aspirin  324 mg Oral Pre-Cath  . aspirin EC  81 mg Oral Daily  . carvedilol  25 mg Oral BID WC  . diazepam  5 mg Oral On Call  . FLUoxetine  40 mg Oral Daily  . glimepiride  4 mg Oral BID WC  . insulin aspart  0-15 Units Subcutaneous TID WC  . insulin glargine  10 Units Subcutaneous Daily  . irbesartan  150 mg Oral Daily  . isosorbide mononitrate  30 mg Oral Daily  . niacin  500 mg Oral QHS  . pantoprazole  80 mg Oral Q1200  . potassium chloride SA  20 mEq Oral Daily  . prasugrel  10 mg Oral Daily  . ranolazine  1,000 mg Oral BID  . rosuvastatin  20 mg Oral q1800  . sodium chloride  3 mL Intravenous Q12H  . traZODone  100 mg Oral QHS   Continuous Infusions:  . sodium chloride     Heparin   . nitroGLYCERIN      Allergies:  Allergen Reactions  . Lisinopril Cough    Social History  . Marital Status: Married    Number of Children: 8   Occupational History    disabled   Social History Main Topics  . Smoking status: Never Smoker   . Alcohol Use: No  . Drug Use: No  . Sexually Active: Yes    Family  History  Problem Relation Age of Onset  . Coronary artery disease Mother   . Colon cancer Mother   . Heart attack Mother   . Heart disease Mother   . Heart failure Mother   . Hypertension Mother   . Malignant hyperthermia Mother   . Colon cancer Sister   . Coronary artery disease Brother   . Heart disease Brother     Review of Systems: General: negative for chills, fever, night sweats or weight changes.  Cardiovascular: As per HPI  Dermatological: negative for rash Respiratory: (+) productive cough; negative for wheezing Urologic: negative for hematuria Abdominal: (+) nausea; negative for vomiting, diarrhea, bright red blood per rectum, melena, or hematemesis Neurologic:(+) dizziness; negative for visual changes, syncope All other systems reviewed and are otherwise negative except as noted above.  Labs:   [facility] 02/17/12 @ 1010 - CBC: WBC 8.3, H&H 15.4/44.4, Plts 207 - CMET: Na 140, K 4.0, Glucose 349, BUN/Crt 19/1.2, LFTs normal - INR 1.1 - Troponin I <0.01, CPK 83  Radiology/Studies:   CXR at [facility] 02/17/12 @ 1045 - No acute cardiopulmonary abnormality    EKG: 02/17/12 @ 1306 - NSR 73bpm, ST down slopping & TWI in I, aVL, V5-6 more pronounced than EKG in 03-18-2011  Physical Exam: Blood pressure 121/93, pulse 76, temperature 97.5 F (36.4 C), temperature source Oral, resp. rate 17, height 5\' 11"  (1.803 m), weight 243 lb (110.224 kg), SpO2 99.00%. General: Well developed, well nourished, white male in no acute distress. Head: Normocephalic, atraumatic, sclera non-icteric, nares are without discharge Neck: Supple. Negative for carotid bruits. JVD not elevated. Lungs: Diminished throughout without wheezes, rales, or rhonchi. Breathing is unlabored. Heart: RRR with S1 S2. No murmurs, rubs, or gallops appreciated. Abdomen: Soft, non-tender, non-distended with normoactive bowel sounds. No rebound/guarding.  No obvious abdominal masses. Msk:  Strength and tone  appear normal for age. Extremities: Trace BLE edema. No clubbing or cyanosis. Distal pedal pulses are 2+ and equal bilaterally. Neuro: Alert and oriented X 3. Moves all extremities spontaneously. Psych:  Responds to questions appropriately with a normal affect.    ASSESSMENT AND PLAN:   61 y.o. male w/ PMHx significant for HTN, HLD, DMII, Systolic CHF, ICM s/p ICD, PAF, and CAD s/p PCI to LAD '00 & PTCA 1st Diagonal '10 who presented to his PCP today for chest pain and was sent to [facility] ED for EKG changes and subsequently transferred to [facility] on 02/17/2012 for further evaluation and treatment.  1. Unstable Angina: He has known CAD and presents with ~6wk h/o crescendo pattern angina and associated symptoms concerning for unstable angina as well as new ST/T wave changes on EKG. He is currently hemodynamically stable with 1/10 chest pain on NTG and Heparin drips. Initial cardiac enzymes negative. He will go for cardiac cath for further ischemic evaluation. Cont to cycle cardiac enzymes and monitor for EKG changes. Add ASA, uptitrate statin, and continue Effient, BB, ARB, Imdur, and Ranexa.  2. Systolic CHF 2/2 ICM s/p ICD: He does not appear volume overloaded. Echo '12 showed EF 50-55%. During his office visit with EP in January of this year he was told his battery was nearing end of life and that his RV lead had been recalled. He has intermittent episodes of ventricular pacing on telemetry. Will likely need interrogation during this admission. Cont Coreg and ARB.  3. H/o PAF: He is in NSR upon admission. There is documentation in '10 showing that PAF was seen when ICD interrogated and that he may benefit from anticoagulation with coumadin or pradaxa. He has never been placed on either of these medications and does not take ASA. His CHADS2 score is 3.   4. Diabetes Mellitus, Type 2: This is poorly controlled with reported readings in the 350-600 range for "quite some time" per the  patient and dietary indiscretions. He is on metformin and glimepiride at home. A1C pending. Hold Metformin for cath. Cont glimepiride and place on Lantus 10units QAM and SSI. Consult diabetes management.  5. Hypertension: SBPs controlled at this time on NTG drip and home meds. Cont current med regimen  6. HLD: LDL 90 on 11/19/11,  Goal < 70. Uptitrate statin  Signed, HOPE, JESSICA PA-C 02/17/2012, 3:14 PM  Patient examined, history reviewed, EKGs reviewed.  He has known coronary disease and has had crescendo pattern of angina over the past several weeks.  EKG today shows new anterolateral STT changes.  He has past history of ischemic cardiomyopathy and has ICD implanted in 2007.  Most recent echo in 2012 showed normal LV systolic function.  Additional problems include medication noncompliance and poorly controlled diabetes with blood sugars at home running in the 300-600 range.  Agree with above findings and plan. Will plan to proceed with left heart cath today.

## 2012-02-17 NOTE — Op Note (Signed)
Cardiac Cath Procedure Note:  Indication: CP  Procedures performed:  1) Selective coronary angiography 2) Left heart catheterization 3) Left ventriculogram  Description of procedure:   The risks and indication of the procedure were explained. Consent was signed and placed on the chart. An appropriate timeout was taken prior to the procedure. After a normal Allen's test was confirmed, the right wrist was prepped and draped in the routine sterile fashion and anesthetized with 1% local lidocaine.   A 5 FR arterial sheath was then placed in the right radial artery using a modified Seldinger technique. Systemic heparin was administered. 3mg  IV verapamil was given through the sheath. Standard catheters including a Jacky, JR4 and straight pigtail were used. All catheter exchanges were made over a wire.  Complications:  None apparent  Findings:  Ao Pressure: 96/68 (80) LV Pressure: 97/5/10 There was no signficant gradient across the aortic valve on pullback.  Left main: Superior origin. Mild plaque  LAD: Large vessel. Fills with TIMI-2 flow likely due to systemic hypotension with SBP in 80-90 range. 50% proximal lesion at takeoff of first diagonal. Diffuse 40% mid to distal. Apical LAD very small. 1st diagonal 60-70% ostial 70% mid 70-80% (no change from previous)  LCX: Several small OMs.Diffuse 40% mid LCX  RCA: Very large dominant vessel. Very mild segmental plaque throughout. About 40% mid PDA.  LV-gram done in the RAO projection: Ejection fraction = Interpretation limited by ectopy. EF grossly normal.  Assessment:  1. Moderate diffuse CAD with moderate to high-grade D1 - no change from previous 2. Grossly normal LV function  Plan/Discussion:  Films reviewed with Dr. Angelena Form. CAD felt to be stable from previous. Continue medical therapy. Check d-dimer. Will need tighter control of DM2. Will hydrate well post cath.   [name] [name] 5:59 PM

## 2012-02-18 DIAGNOSIS — I259 Chronic ischemic heart disease, unspecified: Secondary | ICD-10-CM

## 2012-02-18 LAB — CARDIAC PANEL(CRET KIN+CKTOT+MB+TROPI)
Relative Index: INVALID (ref 0.0–2.5)
Troponin I: <0.3 ng/mL (ref <0.30)

## 2012-02-18 LAB — GLUCOSE, CAPILLARY
Glucose-Capillary: 224 mg/dL — ABNORMAL HIGH (ref 70–99)
Glucose-Capillary: 165 mg/dL — ABNORMAL HIGH (ref 70–99)

## 2012-02-18 LAB — CBC
HCT: 37 % — ABNORMAL LOW (ref 39.0–52.0)
MCV: 88.9 fL (ref 78.0–100.0)
Platelets: 170 10*3/uL (ref 150–400)
RBC: 4.16 MIL/uL — ABNORMAL LOW (ref 4.22–5.81)
WBC: 6.6 10*3/uL (ref 4.0–10.5)

## 2012-02-18 LAB — BASIC METABOLIC PANEL
CO2: 23 mEq/L (ref 19–32)
Chloride: 104 mEq/L (ref 96–112)
Creatinine, Ser: 1.15 mg/dL (ref 0.50–1.35)
Potassium: 4 mEq/L (ref 3.5–5.1)

## 2012-02-18 MED ORDER — GLUCERNA SHAKE PO LIQD
237.0000 mL | Freq: Every day | ORAL | Status: DC
  Administered 2012-02-18 – 2012-02-19 (×2): 237 mL via ORAL

## 2012-02-18 MED FILL — Morphine Sulfate Inj 10 MG/ML: INTRAMUSCULAR | Qty: 1 | Status: AC

## 2012-02-18 MED FILL — Heparin Sodium (Porcine) 100 Unt/ML in Sodium Chloride 0.45%: INTRAMUSCULAR | Qty: 250 | Status: AC

## 2012-02-18 NOTE — Progress Notes (Signed)
INITIAL ADULT NUTRITION ASSESSMENT Date: 02/18/2012   Time: 11:39 AM  Reason for Assessment: Nutrition Risk Report  ASSESSMENT: Male 61 y.o.  Dx: unstable angina  Hx:  Past Medical History  Diagnosis Date  . Hypertension   . Hyperlipidemia   . GERD (gastroesophageal reflux disease)   . MVP (mitral valve prolapse)     s/p MV annul[facility] at [facility]  . Ischemic cardiomyopathy     s/p [employer] [name] (Atlas) ICD implanted in [place] 2007  . Anxiety   . Systolic CHF     EF [id number] by echo 2012  . CAD (coronary artery disease)     s/p Stent to LAD '00, PTCA 2nd diagonal '10  . Diabetes mellitus   . PAF (paroxysmal atrial fibrillation)     noted on ICD interrogation '10   Related Meds:     . aspirin EC  81 mg Oral Daily  . carvedilol  25 mg Oral BID WC  . diazepam  5 mg Oral On Call  . FLUoxetine  40 mg Oral Daily  . glimepiride  4 mg Oral BID WC  . heparin      . heparin      . heparin  5,000 Units Subcutaneous Q8H  . insulin aspart  0-15 Units Subcutaneous TID WC  . insulin glargine  10 Units Subcutaneous Daily  . irbesartan  150 mg Oral Daily  . isosorbide mononitrate  30 mg Oral Daily  . lidocaine      . living well with diabetes book   Does not apply Once  . midazolam      . niacin  500 mg Oral QHS  . nitroGLYCERIN      . ondansetron      . pantoprazole  80 mg Oral Q1200  . potassium chloride SA  20 mEq Oral Daily  . prasugrel  10 mg Oral Daily  . ranolazine  1,000 mg Oral BID  . rosuvastatin  20 mg Oral q1800  . sodium chloride  3 mL Intravenous Q12H  . traZODone  100 mg Oral QHS  . verapamil      . DISCONTD: aspirin  324 mg Oral Pre-Cath  . DISCONTD: FLUoxetine  40 mg Oral Daily  . DISCONTD: ranolazine  1,000 mg Oral BID   Ht: 5\' 11"  (180.3 cm)  Wt: 245 lb 13 oz (111.5 kg)  Ideal Wt: 78.2 kg % Ideal Wt: 143%  Usual Wt: 260 lb (118.2 kg) - at least 6 months ago % Usual Wt: 94%  Body mass index is 34.28 kg/(m^2). Pt meets criteria for Obesity  Class I.  Food/Nutrition Related Hx: Regular diet PTA  Labs:  CMP     Component Value Date/Time   NA 135 02/18/2012 0241   K 4.0 02/18/2012 0241   CL 104 02/18/2012 0241   CO2 23 02/18/2012 0241   GLUCOSE 261* 02/18/2012 0241   BUN 18 02/18/2012 0241   CREATININE 1.15 02/18/2012 0241   CALCIUM 8.7 02/18/2012 0241   PROT 6.8 11/19/2011 0844   ALBUMIN 4.0 11/19/2011 0844   AST 23 11/19/2011 0844   ALT 28 11/19/2011 0844   ALKPHOS 62 11/19/2011 0844   BILITOT 0.9 11/19/2011 0844   GFRNONAA 66* 02/18/2012 0241   GFRAA 77* 02/18/2012 0241   CBG (last 3)   Basename 02/18/12 0748 02/17/12 2142 02/17/12 1821  GLUCAP 224* 320* 220*    Lab Results  Component Value Date   HGBA1C 10.7* 02/17/2012   Intake/Output: I/O last  3 completed shifts: In: 1482.5 [P.O.:30; I.V.:1452.5] Out: 1300 [Urine:1300] Total I/O In: 360 [P.O.:360] Out: -   Diet Order: Carb Control Medium, 1600 - 1800 kcal with 2 gram sodium restriction  Supplements/Tube Feeding: none  IVF:    sodium chloride   DISCONTD: sodium chloride Last Rate: 1 mL/kg/hr (02/17/12 1330)  DISCONTD: heparin Last Rate: 1,300 Units/hr (02/17/12 1330)  DISCONTD: nitroGLYCERIN Last Rate: Stopped (02/17/12 1647)    Estimated Nutritional Needs:   Kcal:  1941 - 2135 kcal Protein:  90 - 105 gram Fluid:  1.9 - 2.1 L/d  RD drawn to chart 2/2 Nutrition Risk Report. Pt states that his usual weight is around 260 lb, however he has lost 15 lb recently, currently down to 245 lb. States weight loss was somewhat intentional. Says he has recently cut out regular sodas and had been more active lately. Pt hoping to continue to lose weight and seems motivated to make better nutrition choices.  Pt states that he is currently eating well and feels as though he is not getting enough to eat. Interested in [place] to provide additional protein and kcal. Provided coupons for Shakes when d/c.  NUTRITION DIAGNOSIS: -Predicted suboptimal energy intake (NI-1.6).   Status: Ongoing  RELATED TO: energy needs  AS EVIDENCE BY: current diet order kcal provision, estimated needs, pt report of hunger between meals.  MONITORING/EVALUATION(Goals): Goal: Pt to meet >/= 90% of needs with meals and supplements. Monitor: PO intake, weights, labs, I/O's  EDUCATION NEEDS: -Education needs addressed  INTERVENTION: 1. RD answered basic questions regarding better nutrition and blood sugar control 2. Glucerna Shake PO daily for additional protein and kcal 3. RD to follow nutrition care plan  Dietitian #: [phone]  [place] Per approved criteria  -Obesity Unspecified    Asencion Partridge 02/18/2012, 11:39 AM

## 2012-02-18 NOTE — Progress Notes (Signed)
CARDIAC REHAB PHASE I   PRE:  Rate/Rhythm: 77 SR    BP: sitting 101/70    SaO2:   MODE:  Ambulation: 700 ft   POST:  Rate/Rhythm: 80    BP: sitting 107/73     SaO2:   Tolerated well. Pt sts he might have <1/10 CP, "I really feel good". No c/o walking, CP did not increase. Increased pace on second lap, only increase in respirations. Still felt good. Discussed DM management with diet and ex and importance for quality and quantity of life. Wife present and they voiced understanding.  [place], [place], ACSM

## 2012-02-18 NOTE — Progress Notes (Signed)
Subjective:  No further chest pain. Cath showed no new lesions requiring PCI.   BP soft this am. No further PMT seen.  Objective:  Vital Signs in the last 24 hours: Temp:  [97.2 F (36.2 C)-97.7 F (36.5 C)] 97.2 F (36.2 C) (03/08 0746) Pulse Rate:  [58-86] 58  (03/08 0746) Resp:  [9-17] 9  (03/08 0746) BP: (89-150)/(48-93) 94/60 mmHg (03/08 0746) SpO2:  [92 %-100 %] 95 % (03/08 0746) Weight:  [243 lb (110.224 kg)-245 lb 13 oz (111.5 kg)] 245 lb 13 oz (111.5 kg) (03/08 0500)  Intake/Output from previous day: 03/07 0701 - 03/08 0700 In: 1482.5 [P.O.:30; I.V.:1452.5] Out: 1300 [Urine:1300] Intake/Output from this shift: Total I/O In: 360 [P.O.:360] Out: -      . aspirin EC  81 mg Oral Daily  . carvedilol  25 mg Oral BID WC  . diazepam  5 mg Oral On Call  . FLUoxetine  40 mg Oral Daily  . glimepiride  4 mg Oral BID WC  . heparin      . heparin      . heparin  5,000 Units Subcutaneous Q8H  . insulin aspart  0-15 Units Subcutaneous TID WC  . insulin glargine  10 Units Subcutaneous Daily  . irbesartan  150 mg Oral Daily  . isosorbide mononitrate  30 mg Oral Daily  . lidocaine      . living well with diabetes book   Does not apply Once  . midazolam      . niacin  500 mg Oral QHS  . nitroGLYCERIN      . ondansetron      . pantoprazole  80 mg Oral Q1200  . potassium chloride SA  20 mEq Oral Daily  . prasugrel  10 mg Oral Daily  . ranolazine  1,000 mg Oral BID  . rosuvastatin  20 mg Oral q1800  . sodium chloride  3 mL Intravenous Q12H  . traZODone  100 mg Oral QHS  . verapamil      . DISCONTD: aspirin  324 mg Oral Pre-Cath  . DISCONTD: FLUoxetine  40 mg Oral Daily  . DISCONTD: ranolazine  1,000 mg Oral BID      . sodium chloride    . DISCONTD: sodium chloride 1 mL/kg/hr (02/17/12 1330)  . DISCONTD: heparin 1,300 Units/hr (02/17/12 1330)  . DISCONTD: nitroGLYCERIN Stopped (02/17/12 1647)    Physical Exam: The patient appears to be in no distress.  Head  and neck exam reveals that the pupils are equal and reactive.  The extraocular movements are full.  There is no scleral icterus.  Mouth and pharynx are benign.  No lymphadenopathy.  No carotid bruits.  The jugular venous pressure is normal.  Thyroid is not enlarged or tender.  Chest is clear to percussion and auscultation.  No rales or rhonchi.  Expansion of the chest is symmetrical.  Heart reveals no abnormal lift or heave.  First and second heart sounds are normal.  There is no murmur gallop rub or click.  The abdomen is soft and nontender.  Bowel sounds are normoactive.  There is no hepatosplenomegaly or mass.  There are no abdominal bruits.  Extremities reveal no phlebitis or edema.  Pedal pulses are good.  There is no cyanosis or clubbing. Right wrist OK  Neurologic exam is normal strength and no lateralizing weakness.  No sensory deficits.  Integument reveals no rash  Lab Results:  Basename 02/18/12 0241 02/17/12 1957  WBC 6.6 8.7  HGB  13.4 13.3  PLT 170 185    Basename 02/18/12 0241 02/17/12 1957  NA 135 --  K 4.0 --  CL 104 --  CO2 23 --  GLUCOSE 261* --  BUN 18 --  CREATININE 1.15 1.13    Basename 02/18/12 0241 02/17/12 1957  TROPONINI <0.30 <0.30   Hepatic Function Panel No results found for this basename: PROT,ALBUMIN,AST,ALT,ALKPHOS,BILITOT,BILIDIR,IBILI in the last 72 hours No results found for this basename: CHOL in the last 72 hours No results found for this basename: PROTIME in the last 72 hours  Imaging: Imaging results have been reviewed.  Cardiac Studies:  Assessment/Plan:  Patient Active Hospital Problem List: CAD    No further chest pain.  Continue current Rx. Low BP    Hold avapro for BP less than 100 PMT    Dr Caryl Comes will check pacer today Uncontrolled diabetes    Lantus started this admission.  Poor outpatient control.  A1C high.  Diabetes teaching nurse to see.  Plan:  Phase 1 rehab , walk in hall. Possibly home over weekend if  stable   LOS: 1 day    [name] [name] 02/18/2012, 8:30 AM

## 2012-02-18 NOTE — Progress Notes (Addendum)
Inpatient Diabetes Program Recommendations  AACE/ADA: New Consensus Statement on Inpatient Glycemic Control (2009)  Target Ranges:  Prepandial:   less than 140 mg/dL      Peak postprandial:   less than 180 mg/dL (1-2 hours)      Critically ill patients:  140 - 180 mg/dL    Inpatient Diabetes Program Recommendations Insulin - Basal: Increase Lantus to 20 units  Correction (SSI): Add HS scale Recommend OP education referral at discharge for [facility].    Thank You  Raoul Pitch RN,BSN,CDE Inpatient Diabetes Coordinator [phone]   Add: spoke with patient about elevated A1C.  Patient said he had his glucose under control and then gained weight recently.  Patient said he has been on insulin at home but was able to stop when he lost weight.  He said he will make lifestyle changes to help manage glucose.

## 2012-02-18 NOTE — Progress Notes (Signed)
UR Completed. Simmons, Jawanza Zambito F [phone]  

## 2012-02-19 ENCOUNTER — Encounter (HOSPITAL_COMMUNITY): Payer: Self-pay | Admitting: Nurse Practitioner

## 2012-02-19 DIAGNOSIS — I200 Unstable angina: Secondary | ICD-10-CM

## 2012-02-19 DIAGNOSIS — I2510 Atherosclerotic heart disease of native coronary artery without angina pectoris: Secondary | ICD-10-CM

## 2012-02-19 HISTORY — DX: Unstable angina: I20.0

## 2012-02-19 LAB — GLUCOSE, CAPILLARY: Glucose-Capillary: 233 mg/dL — ABNORMAL HIGH (ref 70–99)

## 2012-02-19 MED ORDER — METFORMIN HCL 500 MG PO TABS: 1000.0000 mg | ORAL_TABLET | Freq: Two times a day (BID) | ORAL | Status: DC

## 2012-02-19 MED ORDER — ASPIRIN 81 MG PO TBEC: 81.0000 mg | DELAYED_RELEASE_TABLET | Freq: Every day | ORAL | Status: AC

## 2012-02-19 MED ORDER — NITROGLYCERIN 0.4 MG SL SUBL: 0.4000 mg | SUBLINGUAL_TABLET | SUBLINGUAL | Status: DC | PRN

## 2012-02-19 MED ORDER — INSULIN GLARGINE 100 UNIT/ML ~~LOC~~ SOLN: 15.0000 [IU] | Freq: Every day | SUBCUTANEOUS | Status: DC

## 2012-02-19 NOTE — Discharge Summary (Signed)
Patient ID: [name] [name],  MRN: [id number], DOB/AGE: 05/04/1951 61 y.o.  Admit date: 02/17/2012 Discharge date: 02/19/2012  Primary Care Provider: Angelina Sheriff., MD Primary Cardiologist: P. Martinique, MD  Discharge Diagnoses Principal Problem:  *Unstable angina Active Problems:  Hypertension  Hyperlipidemia  CAD (coronary artery disease)  GERD (gastroesophageal reflux disease)  Anxiety  ICD (implantable cardiac defibrillator) in place   Allergies Allergies  Allergen Reactions  . Lisinopril Cough    Procedures  Cardiac Catheterization  main: Superior origin. Mild plaque  LAD: Large vessel. Fills with TIMI-2 flow likely due to systemic hypotension with SBP in 80-90 range. 50% proximal lesion at takeoff of first diagonal. Diffuse 40% mid to distal. Apical LAD very small. 1st diagonal 60-70% ostial 70% mid 70-80% (no change from previous)  LCX: Several small OMs.Diffuse 40% mid LCX  RCA: Very large dominant vessel. Very mild segmental plaque throughout. About 40% mid PDA.  LV-gram done in the RAO projection: Ejection fraction = Interpretation limited by ectopy. EF grossly normal.  Assessment:  1. Moderate diffuse CAD with moderate to high-grade D1 - no change from previous 2. Grossly normal LV function   History of Present Illness  61 year old male with the above complex problem list who presented to [facility] earlier this week secondary to chest pain. Cardiac enzymes were negative. Decision was made to transfer him to [facility] for [facility] evaluation.  Hospital Course  Following arrival to [facility], patient remained pain-free. His cardiac enzymes were negative. Decision was made to pursue a cardiac catheterization. This took place on March 7 revealing moderate nonobstructive CAD as outlined above. Films were reviewed and anatomy was felt to be similar to what was noted on prior angiography. Therefore medical therapy was recommended. Patient has had no further  chest discomfort.  Of note, patient's blood sugars have been elevated throughout this admission and also at [facility] Lantus has been added to his home regimen and we recommended he follow up with primary care provider within the week.   Discharge Vitals Blood pressure 102/63, pulse 58, temperature 97.2 F (36.2 C), temperature source Oral, resp. rate 18, height 5\' 11"  (1.803 m), weight 245 lb 2.4 oz (111.2 kg), SpO2 96.00%.  Filed Weights   02/17/12 1315 02/18/12 0500 02/19/12 0500  Weight: 243 lb (110.224 kg) 245 lb 13 oz (111.5 kg) 245 lb 2.4 oz (111.2 kg)    Labs  CBC  Basename 02/18/12 0241 02/17/12 1957  WBC 6.6 8.7  NEUTROABS -- --  HGB 13.4 13.3  HCT 37.0* 37.3*  MCV 88.9 89.0  PLT 170 [id number]   Basic Metabolic Panel  Basename [id number] 02/17/12 1957  NA 135 --  K 4.0 --  CL 104 --  CO2 23 --  GLUCOSE 261* --  BUN 18 --  CREATININE 1.15 1.13  CALCIUM 8.7 --  MG -- --  PHOS -- --   Cardiac Enzymes  Basename 02/18/12 0241 02/17/12 1957 02/17/12 1501  CKTOTAL 60 71 92  CKMB 1.6 1.8 2.1  CKMBINDEX -- -- --  TROPONINI <0.30 <0.30 <0.30   D-Dimer  Basename 02/17/12 1755  DDIMER 0.43   Hemoglobin A1C  Basename 02/17/12 1502  HGBA1C 10.7*   Disposition  Pt is being discharged home today in good condition.  Follow-up Plans & Appointments  Follow-up Information    Follow up with Peter Martinique, MD in 2 weeks. (we will arrange)    Contact information:   [address] [place]  [employer] [place]       Follow up with [facility] Angelique Blonder., MD in 1 week.   Contact information:   [place] [phone]          Discharge Medications  Medication List  As of 02/19/2012 12:48 PM   STOP taking these medications         glimepiride 4 MG tablet         TAKE these medications         aspirin 81 MG EC tablet   Take 1 tablet (81 mg total) by mouth daily.       atorvastatin 20 MG tablet   Commonly known as: LIPITOR   Take 20 mg by mouth daily.      carvedilol 25 MG tablet   Commonly known as: COREG   Take 25 mg by mouth 2 (two) times daily with a meal.      EFFIENT 10 MG Tabs   Generic drug: prasugrel   Take 10 mg by mouth daily.      esomeprazole 40 MG capsule   Commonly known as: NEXIUM   Take 40 mg by mouth daily before breakfast.      FLUoxetine 40 MG capsule   Commonly known as: PROZAC   Take 40 mg by mouth daily.      insulin glargine 100 UNIT/ML injection   Commonly known as: LANTUS   Inject 15 Units into the skin daily.      isosorbide mononitrate 30 MG 24 hr tablet   Commonly known as: IMDUR   Take 30 mg by mouth daily.      metFORMIN 500 MG tablet   Commonly known as: GLUCOPHAGE   Take 2 tablets (1,000 mg total) by mouth 2 (two) times daily with a meal.      niacin 500 MG CR tablet   Commonly known as: NIASPAN   Take 500 mg by mouth at bedtime.      nitroGLYCERIN 0.4 MG SL tablet   Commonly known as: NITROSTAT   Place 1 tablet (0.4 mg total) under the tongue every 5 (five) minutes x 3 doses as needed for chest pain.      potassium chloride SA 20 MEQ tablet   Commonly known as: K-DUR,KLOR-CON   Take 20 mEq by mouth daily.      RANEXA 1000 MG SR tablet   Generic drug: ranolazine   Take 1,000 mg by mouth 2 (two) times daily.      traZODone 100 MG tablet   Commonly known as: DESYREL   Take 100 mg by mouth at bedtime.      valsartan 160 MG tablet   Commonly known as: DIOVAN   Take 1 tablet (160 mg total) by mouth daily.            Outstanding Labs/Studies  None  Duration of Discharge Encounter   Greater than 30 minutes including physician time.  Signed, Murray Hodgkins NP 02/19/2012, 12:48 PM

## 2012-02-19 NOTE — Progress Notes (Signed)
Pt with wife in no apparent distress, d/c instructions given to pt and wife to include diet, meds, activity restrictions, care of right radial wrist, and fallow-up. Pt and wife verbalize understanding and agreement with d/c instructions. Tele and iv x1 d/c site within normal limits. Pt declined wheelchair and walked to car with wife in no apparent distress.

## 2012-02-19 NOTE — Discharge Instructions (Signed)
***  PLEASE REMEMBER TO BRING ALL OF YOUR MEDICATIONS TO EACH OF YOUR FOLLOW-UP OFFICE VISITS.  NO HEAVY LIFTING OR SEXUAL ACTIVITY X 7 DAYS. NO DRIVING X 2-3 DAYS. NO SOAKING BATHS, HOT TUBS, POOLS, ETC., X 7 DAYS.  

## 2012-02-19 NOTE — Progress Notes (Signed)
Patient ID: [name] [name], male   DOB: Apr 14, 1951, 61 y.o.   MRN: [id number]    SUBJECTIVE: No complaints this morning, no chest pain or dyspnea. Walking with no problems.      Marland Kitchen aspirin EC  81 mg Oral Daily  . carvedilol  25 mg Oral BID WC  . feeding supplement  237 mL Oral Daily  . FLUoxetine  40 mg Oral Daily  . glimepiride  4 mg Oral BID WC  . heparin  5,000 Units Subcutaneous Q8H  . insulin aspart  0-15 Units Subcutaneous TID WC  . insulin glargine  10 Units Subcutaneous Daily  . irbesartan  150 mg Oral Daily  . isosorbide mononitrate  30 mg Oral Daily  . niacin  500 mg Oral QHS  . pantoprazole  80 mg Oral Q1200  . potassium chloride SA  20 mEq Oral Daily  . prasugrel  10 mg Oral Daily  . ranolazine  1,000 mg Oral BID  . rosuvastatin  20 mg Oral q1800  . sodium chloride  3 mL Intravenous Q12H  . traZODone  100 mg Oral QHS      Filed Vitals:   02/19/12 0000 02/19/12 0405 02/19/12 0500 02/19/12 0754  BP: 121/72 115/68  129/66  Pulse:      Temp:  98 F (36.7 C)  97.8 F (36.6 C)  TempSrc:  Oral  Oral  Resp: 18 16  18   Height:      Weight:   245 lb 2.4 oz (111.2 kg)   SpO2:  98%  96%    Intake/Output Summary (Last 24 hours) at 02/19/12 1027 Last data filed at 02/19/12 0800  Gross per 24 hour  Intake   1200 ml  Output   2200 ml  Net  -1000 ml    LABS: Basic Metabolic Panel:  Basename 02/18/12 0241 02/17/12 1957  NA 135 --  K 4.0 --  CL 104 --  CO2 23 --  GLUCOSE 261* --  BUN 18 --  CREATININE 1.15 1.13  CALCIUM 8.7 --  MG -- --  PHOS -- --   Liver Function Tests: No results found for this basename: AST:2,ALT:2,ALKPHOS:2,BILITOT:2,PROT:2,ALBUMIN:2 in the last 72 hours No results found for this basename: LIPASE:2,AMYLASE:2 in the last 72 hours CBC:  Basename 02/18/12 0241 02/17/12 1957  WBC 6.6 8.7  NEUTROABS -- --  HGB 13.4 13.3  HCT 37.0* 37.3*  MCV 88.9 89.0  PLT 170 185   Cardiac Enzymes:  Basename 02/18/12 0241 02/17/12 1957  02/17/12 1501  CKTOTAL 60 71 92  CKMB 1.6 1.8 2.1  CKMBINDEX -- -- --  TROPONINI <0.30 <0.30 <0.30   BNP: No components found with this basename: POCBNP:3 D-Dimer:  Basename 02/17/12 1755  DDIMER 0.43   Hemoglobin A1C:  Basename 02/17/12 1502  HGBA1C 10.7*   RADIOLOGY: No results found.  PHYSICAL EXAM General: NAD Neck: No JVD, no thyromegaly or thyroid nodule.  Lungs: Clear to auscultation bilaterally with normal respiratory effort. CV: Nondisplaced PMI.  Heart regular S1/S2, no S3/S4, no murmur.  No peripheral edema.  No carotid bruit.  Normal pedal pulses.  Abdomen: Soft, nontender, no hepatosplenomegaly, no distention.  Neurologic: Alert and oriented x 3.  Psych: Normal affect. Extremities: No clubbing or cyanosis.   TELEMETRY: Reviewed telemetry pt in NSR  ASSESSMENT AND PLAN:  61 yo with history of CAD and poorly-controlled diabetes presented with chest pain.  Cath showed stable coronary disease.  BP is ok today on current meds.  Blood  glucose is still running high.  Will plan to discharge today on his prior home regimen except: diabetes regimen will be metformin 1000 mg bid + insulin glargine 15 units qhs.  Followup Dr. Martinique in 2 wks, followup PCP for management of diabetes (should see in a week or so).   Loralie Champagne 02/19/2012 10:30 AM

## 2012-03-17 ENCOUNTER — Ambulatory Visit (INDEPENDENT_AMBULATORY_CARE_PROVIDER_SITE_OTHER): Payer: Medicare Other | Admitting: Cardiology

## 2012-03-17 VITALS — BP 106/80 | HR 76 | Ht 71.0 in | Wt 243.0 lb

## 2012-03-17 DIAGNOSIS — I2510 Atherosclerotic heart disease of native coronary artery without angina pectoris: Secondary | ICD-10-CM

## 2012-03-17 DIAGNOSIS — I10 Essential (primary) hypertension: Secondary | ICD-10-CM

## 2012-03-17 DIAGNOSIS — I255 Ischemic cardiomyopathy: Secondary | ICD-10-CM

## 2012-03-17 DIAGNOSIS — I2589 Other forms of chronic ischemic heart disease: Secondary | ICD-10-CM

## 2012-03-17 NOTE — Assessment & Plan Note (Signed)
Blood pressure control is adequate on his current medications.

## 2012-03-17 NOTE — Assessment & Plan Note (Signed)
Recent cardiac catheterization showed no significant change in his coronary disease with modest disease in the LAD distribution. He is stable on medical therapy. We will continue with prasugrel, isosorbide, carvedilol, and Ranexa. We'll followup again in 6 months.

## 2012-03-17 NOTE — Assessment & Plan Note (Signed)
He is status post ICD. He has no significant heart failure symptoms.

## 2012-03-17 NOTE — Progress Notes (Signed)
[name] [name] Date of Birth: 1951/05/24   History of Present Illness: [name] is seen for followup of his coronary disease.  He reports that he is feeling well. He is now on insulin for his diabetes. He did have a recent cardiac catheterization in March when he presented with nausea and had some ECG changes. This demonstrated no significant change with a 50% stenosis in his LAD and more severe disease in a small diagonal side branch. He currently denies any chest pain. He's had no dyspnea. He reports that on insulin therapy his sugars have improved and he has lost 11 pounds.   Current Outpatient Prescriptions on File Prior to Visit  Medication Sig Dispense Refill  . aspirin EC 81 MG EC tablet Take 1 tablet (81 mg total) by mouth daily.      Marland Kitchen atorvastatin (LIPITOR) 20 MG tablet Take 20 mg by mouth daily.        . carvedilol (COREG) 25 MG tablet Take 25 mg by mouth 2 (two) times daily with a meal.        . esomeprazole (NEXIUM) 40 MG capsule Take 40 mg by mouth daily before breakfast.        . FLUoxetine (PROZAC) 40 MG capsule Take 40 mg by mouth daily.        . insulin glargine (LANTUS) 100 UNIT/ML injection Inject 15 Units into the skin daily.  10 mL  2  . isosorbide mononitrate (IMDUR) 30 MG 24 hr tablet Take 30 mg by mouth daily.        . metFORMIN (GLUCOPHAGE) 500 MG tablet Take 2 tablets (1,000 mg total) by mouth 2 (two) times daily with a meal.  60 tablet  3  . niacin (NIASPAN) 500 MG CR tablet Take 500 mg by mouth at bedtime.        . nitroGLYCERIN (NITROSTAT) 0.4 MG SL tablet Place 1 tablet (0.4 mg total) under the tongue every 5 (five) minutes x 3 doses as needed for chest pain.  25 tablet  3  . potassium chloride SA (K-DUR,KLOR-CON) 20 MEQ tablet Take 20 mEq by mouth daily.        . prasugrel (EFFIENT) 10 MG TABS Take 10 mg by mouth daily.        . ranolazine (RANEXA) 1000 MG SR tablet Take 1,000 mg by mouth 2 (two) times daily.        . traZODone (DESYREL) 100 MG  tablet Take 100 mg by mouth at bedtime.        . valsartan (DIOVAN) 160 MG tablet Take 1 tablet (160 mg total) by mouth daily.  30 tablet  11    Allergies  Allergen Reactions  . Lisinopril Cough    Past Medical History  Diagnosis Date  . Hypertension   . Hyperlipidemia   . GERD (gastroesophageal reflux disease)   . MVP (mitral valve prolapse)     s/p MV annuloplasty at [facility] 2004  . Ischemic cardiomyopathy     s/p St. [name] (Atlas) ICD implanted in [place] 2007  . Anxiety   . Systolic CHF     EF 99991111 by echo 2012  . CAD (coronary artery disease)     a.  s/p Stent to LAD '00;  b.  PTCA 2nd diagonal '10;  c. 02/18/12 Cath: moderate nonobs dzs - med rx  . Diabetes mellitus     type II uncontrolled  . PAF (paroxysmal atrial fibrillation)     noted on ICD  interrogation '10  . Episodic mood disorder   . Chronic ischemic heart disease     Past Surgical History  Procedure Date  . Cardiac defibrillator placement     implanted in [place], has a 7001 RV lead and a SJM Atlas ICD followed by Dr Caryl Comes  . Coronary angioplasty   . Mitral valve repair   . Coronary stent placement     History  Smoking status  . Never Smoker   Smokeless tobacco  . Not on file    History  Alcohol Use No    Family History  Problem Relation Age of Onset  . Coronary artery disease Mother   . Colon cancer Mother   . Heart attack Mother   . Heart disease Mother   . Heart failure Mother   . Hypertension Mother   . Malignant hyperthermia Mother   . Colon cancer Sister   . Coronary artery disease Brother   . Heart disease Brother     Review of Systems: As noted in history of present illness. All other systems are reviewed and are negative.  Physical Exam: BP 106/80  Pulse 76  Ht 5\' 11"  (1.803 m)  Wt 243 lb (110.224 kg)  BMI 33.89 kg/m2 He is a pleasant overweight white male in no acute distress. He is normocephalic, atraumatic. Pupils are equal round and reactive to light and  accommodation. Extraocular movements are full. Oropharynx is clear. Neck is supple without JVD, adenopathy, thyromegaly, or bruits. Lungs are clear. Cardiac exam reveals a regular rate and rhythm with a grade 2/6 holosystolic murmur heard best at the apex radiating to the left axilla. There is no S3. His defibrillator site in the left subclavicular area appears normal. He has a healed median sternotomy scar. He has marked central obesity. Femoral and pedal pulses are 2+ and symmetric. He has no significant edema. Skin is warm and dry. He is alert and oriented x3. Cranial nerves II through XII are intact. LABORATORY DATA: ICD check in January was satisfactory.  Assessment / Plan:

## 2012-03-17 NOTE — Patient Instructions (Signed)
Continue your current therapy.  Continue efforts at weight loss and exercise.  I will see you again in 6 months.

## 2012-03-20 ENCOUNTER — Ambulatory Visit (INDEPENDENT_AMBULATORY_CARE_PROVIDER_SITE_OTHER): Payer: Medicare Other | Admitting: *Deleted

## 2012-03-20 ENCOUNTER — Encounter: Payer: Self-pay | Admitting: Internal Medicine

## 2012-03-20 DIAGNOSIS — I255 Ischemic cardiomyopathy: Secondary | ICD-10-CM

## 2012-03-20 DIAGNOSIS — I2589 Other forms of chronic ischemic heart disease: Secondary | ICD-10-CM

## 2012-03-20 LAB — ICD DEVICE OBSERVATION
AL AMPLITUDE: 3.1 mv
AL IMPEDENCE ICD: 475 Ohm
CHARGE TIME: 0 s
RV LEAD AMPLITUDE: 12.1 mv
TOT-0008: 0
TOT-0009: 1
TOT-0010: 49
TZON-0003SLOWVT: 400 ms
TZON-0004SLOWVT: 16
TZON-0005SLOWVT: 6
TZON-0010SLOWVT: 40 ms
TZST-0001SLOWVT: 1

## 2012-03-20 NOTE — Progress Notes (Signed)
ICD check

## 2012-04-10 ENCOUNTER — Telehealth: Payer: Self-pay | Admitting: Cardiology

## 2012-04-10 NOTE — Telephone Encounter (Signed)
Santiago Glad from [place] called was told Dr.Jordan not in office today will check with him on Wed 04/12/12 and call her back.

## 2012-04-10 NOTE — Telephone Encounter (Signed)
New problem:  Per Santiago Glad calling receive fax back . Please clarify  no change need . Does pt need to have a magnet over his defib or not. Please advise

## 2012-04-12 NOTE — Telephone Encounter (Signed)
Left message with Santiago Glad at [place] was told checked with Dr. Martinique and patient does not need magnet over his ICD.

## 2012-06-21 ENCOUNTER — Encounter: Payer: Self-pay | Admitting: *Deleted

## 2012-06-26 ENCOUNTER — Ambulatory Visit (INDEPENDENT_AMBULATORY_CARE_PROVIDER_SITE_OTHER): Payer: Medicare Other | Admitting: *Deleted

## 2012-06-26 ENCOUNTER — Encounter: Payer: Self-pay | Admitting: Internal Medicine

## 2012-06-26 DIAGNOSIS — I259 Chronic ischemic heart disease, unspecified: Secondary | ICD-10-CM

## 2012-06-26 DIAGNOSIS — I2589 Other forms of chronic ischemic heart disease: Secondary | ICD-10-CM

## 2012-06-26 DIAGNOSIS — I255 Ischemic cardiomyopathy: Secondary | ICD-10-CM

## 2012-06-26 LAB — ICD DEVICE OBSERVATION
AL IMPEDENCE ICD: 480 Ohm
AL THRESHOLD: 0.5 V
BAMS-0001: 160 {beats}/min
BAMS-0003: 70 {beats}/min
BATTERY VOLTAGE: 2.53 V
TZON-0004SLOWVT: 16
TZON-0005SLOWVT: 6
TZST-0001SLOWVT: 1
TZST-0002SLOWVT: NEGATIVE
VENTRICULAR PACING ICD: 3 pct

## 2012-06-26 NOTE — Progress Notes (Signed)
icd check

## 2012-07-21 ENCOUNTER — Telehealth: Payer: Self-pay | Admitting: Cardiology

## 2012-07-21 NOTE — Telephone Encounter (Signed)
Spoke to Venus at [place] office.She stated Dr.Redding saw patient today with muscle aches and pains.Stated Dr.Redding told patient to hold lipitor for 1 week .Dr.Redding follow up,wanted Dr.Jordan to know.

## 2012-07-21 NOTE — Telephone Encounter (Signed)
[place]  Patient presented with muscle aches and pains, please return call to LPN [facility] concerning medicine hold.

## 2012-07-31 ENCOUNTER — Encounter: Payer: Self-pay | Admitting: *Deleted

## 2012-09-15 ENCOUNTER — Ambulatory Visit (INDEPENDENT_AMBULATORY_CARE_PROVIDER_SITE_OTHER): Payer: Medicare Other | Admitting: Cardiology

## 2012-09-15 ENCOUNTER — Encounter: Payer: Self-pay | Admitting: Cardiology

## 2012-09-15 VITALS — BP 154/102 | HR 66 | Ht 71.0 in | Wt 255.1 lb

## 2012-09-15 DIAGNOSIS — R55 Syncope and collapse: Secondary | ICD-10-CM

## 2012-09-15 DIAGNOSIS — I2589 Other forms of chronic ischemic heart disease: Secondary | ICD-10-CM

## 2012-09-15 DIAGNOSIS — I2510 Atherosclerotic heart disease of native coronary artery without angina pectoris: Secondary | ICD-10-CM

## 2012-09-15 DIAGNOSIS — I255 Ischemic cardiomyopathy: Secondary | ICD-10-CM

## 2012-09-15 DIAGNOSIS — I259 Chronic ischemic heart disease, unspecified: Secondary | ICD-10-CM

## 2012-09-15 HISTORY — DX: Syncope and collapse: R55

## 2012-09-15 MED ORDER — NITROGLYCERIN 0.4 MG SL SUBL: 0.4000 mg | SUBLINGUAL_TABLET | SUBLINGUAL | Status: DC | PRN

## 2012-09-15 NOTE — Patient Instructions (Addendum)
Reduce your carvedilol to 12.5 mg twice a day.  Monitor your blood pressure especially when you have dizzyness.  I will see you in 6 months.

## 2012-09-15 NOTE — Progress Notes (Signed)
Gwen Her Heesch Date of Birth: June 17, 1951   History of Present Illness: Mr. Lantigua is seen for followup of his coronary disease.  He reports that he has been getting fairly frequent episodes of acute lightheadedness and near syncope. He states that during these times he almost becomes incoherent and his vision decreases. This may occur when he is just sitting or walking. He presented to the emergency room at [facility] and was told that he was dehydrated. He has made a conscious effort to aggressively hydrate without any improvement in his symptoms. He does note at least one time when his blood pressure dropped into the 0000000 systolic with an episode. Typically his blood pressure at home is between [id number] and [id number] systolic when he takes his medications. His last ICD check in July showed no arrhythmias.   Current Outpatient Prescriptions on File Prior to Visit  Medication Sig Dispense Refill  . aspirin EC 81 MG EC tablet Take 1 tablet (81 mg total) by mouth daily.      Marland Kitchen atorvastatin (LIPITOR) 20 MG tablet Take 20 mg by mouth daily.        . carvedilol (COREG) 25 MG tablet Take 25 mg by mouth 2 (two) times daily with a meal.        . esomeprazole (NEXIUM) 40 MG capsule Take 40 mg by mouth daily before breakfast.        . FLUoxetine (PROZAC) 40 MG capsule Take 40 mg by mouth daily.        . isosorbide mononitrate (IMDUR) 30 MG 24 hr tablet Take 30 mg by mouth daily.        . metFORMIN (GLUCOPHAGE) 500 MG tablet Take 2 tablets (1,000 mg total) by mouth 2 (two) times daily with a meal.  60 tablet  3  . niacin (NIASPAN) 500 MG CR tablet Take 500 mg by mouth at bedtime.        . potassium chloride SA (K-DUR,KLOR-CON) 20 MEQ tablet Take 20 mEq by mouth daily.        . prasugrel (EFFIENT) 10 MG TABS Take 10 mg by mouth daily.        . ranolazine (RANEXA) 1000 MG SR tablet Take 1,000 mg by mouth 2 (two) times daily.        . traZODone (DESYREL) 100 MG tablet Take 100 mg by mouth at bedtime as needed.        . valsartan (DIOVAN) 160 MG tablet Take 1 tablet (160 mg total) by mouth daily.  30 tablet  11  . DISCONTD: insulin glargine (LANTUS) 100 UNIT/ML injection Inject 15 Units into the skin daily.  10 mL  2  . DISCONTD: nitroGLYCERIN (NITROSTAT) 0.4 MG SL tablet Place 1 tablet (0.4 mg total) under the tongue every 5 (five) minutes x 3 doses as needed for chest pain.  25 tablet  3    Allergies  Allergen Reactions  . Lisinopril Cough    Past Medical History  Diagnosis Date  . Hypertension   . Hyperlipidemia   . GERD (gastroesophageal reflux disease)   . MVP (mitral valve prolapse)     s/p MV annuloplasty at [facility] 2004  . Ischemic cardiomyopathy     s/p St. [name] (Atlas) ICD implanted in [place] 2007  . Anxiety   . Systolic CHF     EF 99991111 by echo 2012  . CAD (coronary artery disease)     a.  s/p Stent to LAD '00;  b.  PTCA 2nd  diagonal '10;  c. 02/18/12 Cath: moderate nonobs dzs - med rx  . Diabetes mellitus     type II uncontrolled  . PAF (paroxysmal atrial fibrillation)     noted on ICD interrogation '10  . Episodic mood disorder   . Chronic ischemic heart disease     Past Surgical History  Procedure Date  . Cardiac defibrillator placement     implanted in [place], has a 7001 RV lead and a SJM Atlas ICD followed by Dr Caryl Comes  . Coronary angioplasty   . Mitral valve repair   . Coronary stent placement     History  Smoking status  . Never Smoker   Smokeless tobacco  . Not on file    History  Alcohol Use No    Family History  Problem Relation Age of Onset  . Coronary artery disease Mother   . Colon cancer Mother   . Heart attack Mother   . Heart disease Mother   . Heart failure Mother   . Hypertension Mother   . Malignant hyperthermia Mother   . Colon cancer Sister   . Coronary artery disease Brother   . Heart disease Brother     Review of Systems: As noted in history of present illness. All other systems are reviewed and are  negative.  Physical Exam: BP 154/102  Pulse 66  Ht 5\' 11"  (1.803 m)  Wt 255 lb 1.9 oz (115.722 kg)  BMI 35.58 kg/m2  SpO2 98% He is a pleasant overweight white male in no acute distress. He is normocephalic, atraumatic. Pupils are equal round and reactive to light and accommodation. Extraocular movements are full. Oropharynx is clear. Neck is supple without JVD, adenopathy, thyromegaly, or bruits. Lungs are clear. Cardiac exam reveals a regular rate and rhythm with a grade 2/6 holosystolic murmur heard best at the apex radiating to the left axilla. There is no S3. His defibrillator site in the left subclavicular area appears normal. He has a healed median sternotomy scar. He has marked central obesity. Femoral and pedal pulses are 2+ and symmetric. He has no significant edema. Skin is warm and dry. He is alert and oriented x3. Cranial nerves II through XII are intact. LABORATORY DATA: ICD check in July was satisfactory. He is approaching elective replacement indicator.  Assessment / Plan: 1. Near syncope. I suspect that he is having periods where his blood pressure drops. This has not responded to hydration. No prior evidence of arrhythmia. He reports his blood sugars are normal. I recommended reducing his carvedilol to 12.5 mg twice a day to see if this will help with his symptoms. He is scheduled for an ICD check later this month. I requested that he monitor his blood pressure at home particularly when he is having these episodes of dizziness so that we can correlate this.  2. Coronary disease status post stenting of the LAD. Cardiac catheterization in March of 2013 showed continued patency of the stent with moderate nonobstructive disease.  3. History of congestive heart failure with chronic systolic dysfunction. Ejection fraction had improved to 50-55% by echo in 2012. He is status post ICD implant.  4. History of paroxysmal atrial fibrillation by ICD check in 2010. No evidence of  recurrence.  5. Hypertension. Blood pressure is elevated today but his history is consistent with intermittent hypotension. He had not taken his medications today.  6. Diabetes mellitus on insulin.  7. History of mitral valve prolapse status post mitral annuloplasty in 2004. Echocardiogram in July  2011 showed only mild mitral insufficiency.

## 2012-09-25 ENCOUNTER — Ambulatory Visit (INDEPENDENT_AMBULATORY_CARE_PROVIDER_SITE_OTHER): Payer: Medicare Other | Admitting: *Deleted

## 2012-09-25 ENCOUNTER — Encounter: Payer: Self-pay | Admitting: Internal Medicine

## 2012-09-25 DIAGNOSIS — I255 Ischemic cardiomyopathy: Secondary | ICD-10-CM

## 2012-09-25 DIAGNOSIS — R55 Syncope and collapse: Secondary | ICD-10-CM

## 2012-09-25 DIAGNOSIS — I2589 Other forms of chronic ischemic heart disease: Secondary | ICD-10-CM

## 2012-09-25 LAB — ICD DEVICE OBSERVATION
AL AMPLITUDE: 3.1 mv
AL IMPEDENCE ICD: 510 Ohm
AL THRESHOLD: 0.5 V
CHARGE TIME: 13.9 s
DEVICE MODEL ICD: 345070
HV IMPEDENCE: 37 Ohm
RV LEAD AMPLITUDE: 12.1 mv
RV LEAD IMPEDENCE ICD: 375 Ohm
TOT-0008: 0
TOT-0009: 1
TOT-0010: 53
TZON-0004SLOWVT: 16
TZON-0005SLOWVT: 6
TZST-0001SLOWVT: 1
TZST-0002SLOWVT: NEGATIVE

## 2012-09-25 NOTE — Progress Notes (Signed)
defib check in clinic  

## 2012-12-29 ENCOUNTER — Encounter: Payer: Self-pay | Admitting: Internal Medicine

## 2012-12-29 ENCOUNTER — Ambulatory Visit (INDEPENDENT_AMBULATORY_CARE_PROVIDER_SITE_OTHER): Payer: Medicare Other | Admitting: Internal Medicine

## 2012-12-29 VITALS — BP 134/92 | HR 78 | Ht 71.0 in | Wt 241.4 lb

## 2012-12-29 DIAGNOSIS — I259 Chronic ischemic heart disease, unspecified: Secondary | ICD-10-CM

## 2012-12-29 DIAGNOSIS — I2589 Other forms of chronic ischemic heart disease: Secondary | ICD-10-CM

## 2012-12-29 DIAGNOSIS — I2510 Atherosclerotic heart disease of native coronary artery without angina pectoris: Secondary | ICD-10-CM

## 2012-12-29 LAB — ICD DEVICE OBSERVATION
AL AMPLITUDE: 3 mv
AL IMPEDENCE ICD: 520 Ohm
AL THRESHOLD: 0.25 V
BATTERY VOLTAGE: 2.48 V
CHARGE TIME: 0 s
HV IMPEDENCE: 37 Ohm
RV LEAD THRESHOLD: 1 V
TOT-0006: 20140117000000
TOT-0007: 0
TOT-0009: 1
TOT-0010: 54
TZON-0003SLOWVT: 400 ms
TZON-0005SLOWVT: 6
TZON-0010SLOWVT: 40 ms

## 2012-12-29 NOTE — Progress Notes (Signed)
PCP: [facility] Angelique Blonder., MD Primary Cardiologist:  Dr Martinique  Dion J Nethery is a 62 y.o. male who presents today for routine electrophysiology followup.  Since last being seen in our clinic, the patient reports doing very well.  His dizziness is much improved off of niacin.   Today, he denies symptoms of palpitations, chest pain, shortness of breath,  lower extremity edema, dizziness, presyncope, syncope, or ICD shocks.  The patient is otherwise without complaint today.   Past Medical History  Diagnosis Date  . Hypertension   . Hyperlipidemia   . GERD (gastroesophageal reflux disease)   . MVP (mitral valve prolapse)     s/p MV annuloplasty at [facility] 2004  . Ischemic cardiomyopathy     s/p St. [name] (Atlas) ICD implanted in [place] 2007  . Anxiety   . Systolic CHF     EF 99991111 by echo 2012  . CAD (coronary artery disease)     a.  s/p Stent to LAD '00;  b.  PTCA 2nd diagonal '10;  c. 02/18/12 Cath: moderate nonobs dzs - med rx  . Diabetes mellitus     type II uncontrolled  . PAF (paroxysmal atrial fibrillation)     noted on ICD interrogation '10  . Episodic mood disorder   . Chronic ischemic heart disease    Past Surgical History  Procedure Date  . Cardiac defibrillator placement     implanted in [place], has a 7001 RV lead and a SJM Atlas ICD followed by Dr Caryl Comes  . Coronary angioplasty   . Mitral valve repair   . Coronary stent placement     Current Outpatient Prescriptions  Medication Sig Dispense Refill  . aspirin EC 81 MG EC tablet Take 1 tablet (81 mg total) by mouth daily.      Marland Kitchen atorvastatin (LIPITOR) 20 MG tablet Take 20 mg by mouth daily.        . carvedilol (COREG) 25 MG tablet Take 25 mg by mouth 2 (two) times daily with a meal.        . esomeprazole (NEXIUM) 40 MG capsule Take 40 mg by mouth daily before breakfast.        . FLUoxetine (PROZAC) 40 MG capsule Take 40 mg by mouth daily.        . insulin glargine (LANTUS) 100 UNIT/ML injection Inject 20  Units into the skin daily.      . isosorbide mononitrate (IMDUR) 30 MG 24 hr tablet Take 30 mg by mouth daily.        . metFORMIN (GLUCOPHAGE) 500 MG tablet Take 2 tablets (1,000 mg total) by mouth 2 (two) times daily with a meal.  60 tablet  3  . nitroGLYCERIN (NITROSTAT) 0.4 MG SL tablet Place 1 tablet (0.4 mg total) under the tongue every 5 (five) minutes x 3 doses as needed for chest pain.  25 tablet  3  . potassium chloride SA (K-DUR,KLOR-CON) 20 MEQ tablet Take 20 mEq by mouth daily.        . prasugrel (EFFIENT) 10 MG TABS Take 10 mg by mouth daily.        . ranolazine (RANEXA) 1000 MG SR tablet Take 1,000 mg by mouth 2 (two) times daily.        . traZODone (DESYREL) 100 MG tablet Take 100 mg by mouth at bedtime as needed.       . valsartan (DIOVAN) 160 MG tablet Take 160 mg by mouth daily.      Marland Kitchen  niacin (NIASPAN) 500 MG CR tablet Take 500 mg by mouth at bedtime.          Physical Exam: Filed Vitals:   12/29/12 0912  BP: 134/92  Pulse: 78  Height: 5\' 11"  (1.803 m)  Weight: 241 lb 6.4 oz (109.498 kg)    GEN- The patient is well appearing, alert and oriented x 3 today.   Head- normocephalic, atraumatic Eyes-  Sclera clear, conjunctiva pink Ears- hearing intact Oropharynx- clear Lungs- Clear to ausculation bilaterally, normal work of breathing Chest- ICD pocket is well healed Heart- Regular rate and rhythm, no murmurs, rubs or gallops, PMI not laterally displaced GI- soft, NT, ND, + BS Extremities- no clubbing, cyanosis, or edema  ICD interrogation- reviewed in detail today,  See PACEART report  Assessment and Plan:  1.  Chronic systolic dysfunction/ CAD/ ischemic CM euvolemic today Stable on an appropriate medical regimen Normal ICD function He has a Riata 7001 RV lead which appears to be functioning normally.  I have informed him of the advisory associated with this lead and have ordered a PA and lateral CXR today. See Claudia Desanctis Art report No changes today  Return in 2  months for device battery check with our device clinic

## 2012-12-29 NOTE — Patient Instructions (Addendum)
Your physician recommends that you schedule a follow-up appointment in: 2 months in the device clinic and 6 months with Dr Rayann Heman  A chest x-ray takes a picture of the organs and structures inside the chest, including the heart, lungs, and blood vessels. This test can show several things, including, whether the heart is enlarges; whether fluid is building up in the lungs; and whether pacemaker / defibrillator leads are still in place.

## 2013-03-17 ENCOUNTER — Encounter (HOSPITAL_COMMUNITY): Payer: Self-pay | Admitting: Emergency Medicine

## 2013-03-17 ENCOUNTER — Telehealth: Payer: Self-pay | Admitting: Physician Assistant

## 2013-03-17 ENCOUNTER — Ambulatory Visit (HOSPITAL_COMMUNITY)
Admission: EM | Admit: 2013-03-17 | Discharge: 2013-03-19 | Disposition: A | Discharge status: Home / Self Care | Payer: Medicare Other | Attending: Cardiovascular Disease | Admitting: Cardiovascular Disease

## 2013-03-17 DIAGNOSIS — I255 Ischemic cardiomyopathy: Secondary | ICD-10-CM

## 2013-03-17 DIAGNOSIS — I5022 Chronic systolic (congestive) heart failure: Secondary | ICD-10-CM | POA: Insufficient documentation

## 2013-03-17 DIAGNOSIS — E785 Hyperlipidemia, unspecified: Secondary | ICD-10-CM | POA: Insufficient documentation

## 2013-03-17 DIAGNOSIS — Z45010 Encounter for checking and testing of cardiac pacemaker pulse generator [battery]: Secondary | ICD-10-CM

## 2013-03-17 DIAGNOSIS — E119 Type 2 diabetes mellitus without complications: Secondary | ICD-10-CM

## 2013-03-17 DIAGNOSIS — I259 Chronic ischemic heart disease, unspecified: Secondary | ICD-10-CM

## 2013-03-17 DIAGNOSIS — I10 Essential (primary) hypertension: Secondary | ICD-10-CM | POA: Insufficient documentation

## 2013-03-17 DIAGNOSIS — Z4502 Encounter for adjustment and management of automatic implantable cardiac defibrillator: Secondary | ICD-10-CM

## 2013-03-17 DIAGNOSIS — I059 Rheumatic mitral valve disease, unspecified: Secondary | ICD-10-CM | POA: Insufficient documentation

## 2013-03-17 DIAGNOSIS — I509 Heart failure, unspecified: Secondary | ICD-10-CM | POA: Insufficient documentation

## 2013-03-17 DIAGNOSIS — I2510 Atherosclerotic heart disease of native coronary artery without angina pectoris: Secondary | ICD-10-CM

## 2013-03-17 DIAGNOSIS — I2589 Other forms of chronic ischemic heart disease: Secondary | ICD-10-CM | POA: Insufficient documentation

## 2013-03-17 HISTORY — DX: Major depressive disorder, single episode, unspecified: F32.9

## 2013-03-17 HISTORY — DX: Depression, unspecified: F32.A

## 2013-03-17 NOTE — ED Provider Notes (Signed)
History     CSN: [id number]  Arrival date & time 03/17/13  2130   First MD Initiated Contact with Patient 03/17/13 2319      Chief Complaint  Patient presents with  . Pacemaker Problem    (Consider location/radiation/quality/duration/timing/severity/associated sxs/prior treatment) HPI History provided by patient.  At home tonight sitting on the porch and noticed a buzzing sensation in his chest, and is pacemaker lasting about 5-10 minutes. He also has a defibrillator, no shock. He is followed by [facility] cardiology, was recently told that his pacemaker has been recalled and there may be a problem with one of the leads. She was also told that the battery may be running out and to watch out for any buzzing that may indicate the battery is dead.  He called his pacemaker company who called his cardiologist and referred him to the emergency department for evaluation. No chest pain. No shortness of breath. No weakness. No palpitations. Patient denies any complaints.  buzzing mild in severity and now resolved. No history of same Past Medical History  Diagnosis Date  . Hypertension   . Hyperlipidemia   . GERD (gastroesophageal reflux disease)   . MVP (mitral valve prolapse)     s/p MV annuloplasty at [facility] 2004  . Ischemic cardiomyopathy     s/p St. [name] (Atlas) ICD implanted in [place] 2007  . Anxiety   . Systolic CHF     EF 99991111 by echo 2012  . CAD (coronary artery disease)     a.  s/p Stent to LAD '00;  b.  PTCA 2nd diagonal '10;  c. 02/18/12 Cath: moderate nonobs dzs - med rx  . Diabetes mellitus     type II uncontrolled  . PAF (paroxysmal atrial fibrillation)     noted on ICD interrogation '10  . Episodic mood disorder   . Chronic ischemic heart disease     Past Surgical History  Procedure Laterality Date  . Cardiac defibrillator placement      implanted in [place], has a 7001 RV lead and a SJM Atlas ICD followed by Dr Caryl Comes  . Coronary angioplasty    . Mitral valve  repair    . Coronary stent placement      Family History  Problem Relation Age of Onset  . Coronary artery disease Mother   . Colon cancer Mother   . Heart attack Mother   . Heart disease Mother   . Heart failure Mother   . Hypertension Mother   . Malignant hyperthermia Mother   . Colon cancer Sister   . Coronary artery disease Brother   . Heart disease Brother     History  Substance Use Topics  . Smoking status: Never Smoker   . Smokeless tobacco: Not on file  . Alcohol Use: No      Review of Systems  Constitutional: Negative for fever and chills.  HENT: Negative for neck pain and neck stiffness.   Eyes: Negative for pain.  Respiratory: Negative for shortness of breath.   Cardiovascular: Negative for chest pain and palpitations.  Gastrointestinal: Negative for abdominal pain.  Genitourinary: Negative for dysuria.  Musculoskeletal: Negative for back pain.  Skin: Negative for rash.  Neurological: Negative for headaches.  All other systems reviewed and are negative.    Allergies  Lisinopril  Home Medications   Current Outpatient Rx  Name  Route  Sig  Dispense  Refill  . atorvastatin (LIPITOR) 20 MG tablet   Oral   Take 20  mg by mouth daily.           . carvedilol (COREG) 25 MG tablet   Oral   Take 25 mg by mouth 2 (two) times daily with a meal.           . esomeprazole (NEXIUM) 40 MG capsule   Oral   Take 40 mg by mouth daily before breakfast.           . FLUoxetine (PROZAC) 40 MG capsule   Oral   Take 40 mg by mouth daily.           Marland Kitchen EXPIRED: insulin glargine (LANTUS) 100 UNIT/ML injection   Subcutaneous   Inject 20 Units into the skin daily.         . isosorbide mononitrate (IMDUR) 30 MG 24 hr tablet   Oral   Take 30 mg by mouth daily.           Marland Kitchen EXPIRED: metFORMIN (GLUCOPHAGE) 500 MG tablet   Oral   Take 2 tablets (1,000 mg total) by mouth 2 (two) times daily with a meal.   60 tablet   3     Begin 02/21/2012   . niacin  (NIASPAN) 500 MG CR tablet   Oral   Take 500 mg by mouth at bedtime.           . nitroGLYCERIN (NITROSTAT) 0.4 MG SL tablet   Sublingual   Place 1 tablet (0.4 mg total) under the tongue every 5 (five) minutes x 3 doses as needed for chest pain.   25 tablet   3   . potassium chloride SA (K-DUR,KLOR-CON) 20 MEQ tablet   Oral   Take 20 mEq by mouth daily.           . prasugrel (EFFIENT) 10 MG TABS   Oral   Take 10 mg by mouth daily.           . ranolazine (RANEXA) 1000 MG SR tablet   Oral   Take 1,000 mg by mouth 2 (two) times daily.           . traZODone (DESYREL) 100 MG tablet   Oral   Take 100 mg by mouth at bedtime as needed.          . valsartan (DIOVAN) 160 MG tablet   Oral   Take 160 mg by mouth daily.           BP 172/101  Pulse 70  Temp(Src) 97.4 F (36.3 C) (Oral)  Resp 16  SpO2 97%  Physical Exam  Constitutional: He is oriented to person, place, and time. He appears well-developed and well-nourished.  HENT:  Head: Normocephalic and atraumatic.  Mouth/Throat: Oropharynx is clear and moist.  Eyes: EOM are normal. Pupils are equal, round, and reactive to light.  Neck: Neck supple.  Cardiovascular: Normal rate, regular rhythm and intact distal pulses.   Pulmonary/Chest: Effort normal and breath sounds normal. No respiratory distress.  Abdominal: Soft. Bowel sounds are normal. He exhibits no distension. There is no tenderness.  Musculoskeletal: Normal range of motion. He exhibits no edema and no tenderness.  Neurological: He is alert and oriented to person, place, and time.  Skin: Skin is warm and dry.    ED Course  Procedures (including critical care time)  Results for orders placed during the hospital encounter of 03/17/13  CBC      Result Value Range   WBC 7.5  4.0 - 10.5 K/uL   RBC 4.58  4.22 - 5.81 MIL/uL   Hemoglobin 13.9  13.0 - 17.0 g/dL   HCT 38.0 (*) 39.0 - 52.0 %   MCV 83.0  78.0 - 100.0 fL   MCH 30.3  26.0 - 34.0 pg   MCHC  36.6 (*) 30.0 - 36.0 g/dL   RDW 12.6  11.5 - 15.5 %   Platelets 227  150 - 400 K/uL  POCT I-STAT, CHEM 8      Result Value Range   Sodium 138  135 - 145 mEq/L   Potassium 3.5  3.5 - 5.1 mEq/L   Chloride 103  96 - 112 mEq/L   BUN 18  6 - 23 mg/dL   Creatinine, Ser 1.10  0.50 - 1.35 mg/dL   Glucose, Bld 221 (*) 70 - 99 mg/dL   Calcium, Ion 1.16  1.13 - 1.30 mmol/L   TCO2 22  0 - 100 mmol/L   Hemoglobin 13.3  13.0 - 17.0 g/dL   HCT 39.0  39.0 - 52.0 %   Dg Chest 2 View  03/18/2013  *RADIOLOGY REPORT*  Clinical Data: Pacemaker checked.  CHEST - 2 VIEW  Comparison: 03/28/2012  Findings: Stable appearance of cardiac pacemaker and mediastinal postoperative changes since the previous study. The heart size and pulmonary vascularity are normal. The lungs appear clear and expanded without focal air space disease or consolidation. No blunting of the costophrenic angles.  No pneumothorax.  Mediastinal contours appear intact.  Mild hyperinflation.  No significant change since previous study.  IMPRESSION: No evidence of active pulmonary disease.  Stable radiographic appearance of cardiac pacemaker since previous study.   Original Report Authenticated By: Lucienne Capers, M.D.        Date: 03/17/2013  Rate: 72  Rhythm: normal sinus rhythm  QRS Axis: normal  Intervals: normal  ST/T Wave abnormalities: nonspecific ST changes  Conduction Disutrbances:nonspecific intraventricular conduction delay  Narrative Interpretation:   Old EKG Reviewed: unchanged EKG dated 12/29/2012 demonstrates sinus rate 78  ST [name] interogation device not getting data from his pacemaker/ defib, rep concerned its not working - no pacer spikes on cardiac monitor.  12:14 AM d/w CAR fellow - will eval bedside  3:55 AM evaluated by cardiology and plan admit MDM  Pacemaker not functioning  EKG. Chest x-ray. Labs.  CAR consult - admission        Teressa Lower, MD 03/18/13 [phone]

## 2013-03-17 NOTE — Telephone Encounter (Signed)
[name] [name] is a 62 y.o. male with a St. [name] ICD.  His device has been reaching ERI over the last several mos.  However, he also has a Riata 7001 RV lead.  Gwen Her Bayman notes that his device started alarming about 1/2 hour ago.  It is making a buzzing sound.  This alarm was demonstrated to him in the office previously. I spoke with Dr. Thompson Grayer.  The patient has no home remote device. His device needs to be interrogated tonight to determine if his device is at [facility] or if there is an issue with his Riata lead.   The patient will have to go to [facility] ED tonight for device interrogation. I spoke with his wife who agrees to drive him. Richardson Dopp, PA-C  8:37 PM 03/17/2013

## 2013-03-17 NOTE — ED Notes (Signed)
PT. REQUESTING HIS PACEMAKER/DEFIBRILLATOR CHECK STATES HE FELT A VIBRATION TODAY CONCERNED ABOUT MALFUNCTION /LOW BATTERY. DENIES CHEST PAIN/PALPITATIONS OR SOB.

## 2013-03-18 ENCOUNTER — Encounter (HOSPITAL_COMMUNITY): Payer: Self-pay | Admitting: General Practice

## 2013-03-18 ENCOUNTER — Emergency Department (HOSPITAL_COMMUNITY): Payer: Medicare Other

## 2013-03-18 DIAGNOSIS — I2589 Other forms of chronic ischemic heart disease: Secondary | ICD-10-CM

## 2013-03-18 DIAGNOSIS — Z4502 Encounter for adjustment and management of automatic implantable cardiac defibrillator: Secondary | ICD-10-CM

## 2013-03-18 DIAGNOSIS — Z7189 Other specified counseling: Secondary | ICD-10-CM

## 2013-03-18 DIAGNOSIS — R079 Chest pain, unspecified: Secondary | ICD-10-CM

## 2013-03-18 DIAGNOSIS — Z95810 Presence of automatic (implantable) cardiac defibrillator: Secondary | ICD-10-CM

## 2013-03-18 HISTORY — DX: Encounter for adjustment and management of automatic implantable cardiac defibrillator: Z45.02

## 2013-03-18 HISTORY — DX: Other specified counseling: Z71.89

## 2013-03-18 LAB — CBC
HCT: 38 % — ABNORMAL LOW (ref 39.0–52.0)
Hemoglobin: 13.9 g/dL (ref 13.0–17.0)
Hemoglobin: 13.9 g/dL (ref 13.0–17.0)
MCH: 30.3 pg (ref 26.0–34.0)
MCH: 30.8 pg (ref 26.0–34.0)
MCHC: 36.6 g/dL — ABNORMAL HIGH (ref 30.0–36.0)
MCHC: 36.8 g/dL — ABNORMAL HIGH (ref 30.0–36.0)
MCV: 83.6 fL (ref 78.0–100.0)
RBC: 4.52 MIL/uL (ref 4.22–5.81)
RBC: 4.58 MIL/uL (ref 4.22–5.81)

## 2013-03-18 LAB — GLUCOSE, CAPILLARY
Glucose-Capillary: 191 mg/dL — ABNORMAL HIGH (ref 70–99)
Glucose-Capillary: 248 mg/dL — ABNORMAL HIGH (ref 70–99)
Glucose-Capillary: 221 mg/dL — ABNORMAL HIGH (ref 70–99)

## 2013-03-18 LAB — HEMOGLOBIN A1C: Hgb A1c MFr Bld: 12.5 % — ABNORMAL HIGH (ref <5.7)

## 2013-03-18 LAB — POCT I-STAT, CHEM 8
BUN: 18 mg/dL (ref 6–23)
Creatinine, Ser: 1.1 mg/dL (ref 0.50–1.35)
Glucose, Bld: 221 mg/dL — ABNORMAL HIGH (ref 70–99)
Potassium: 3.5 mEq/L (ref 3.5–5.1)
Sodium: 138 mEq/L (ref 135–145)
TCO2: 22 mmol/L (ref 0–100)

## 2013-03-18 MED ORDER — IRBESARTAN 75 MG PO TABS
75.0000 mg | ORAL_TABLET | Freq: Every day | ORAL | Status: DC
  Administered 2013-03-18 – 2013-03-19 (×2): 75 mg via ORAL
  Filled 2013-03-18 (×2): qty 1

## 2013-03-18 MED ORDER — PRASUGREL HCL 10 MG PO TABS
10.0000 mg | ORAL_TABLET | Freq: Every day | ORAL | Status: DC
  Administered 2013-03-18 – 2013-03-19 (×2): 10 mg via ORAL
  Filled 2013-03-18 (×2): qty 1

## 2013-03-18 MED ORDER — SODIUM CHLORIDE 0.45 % IV SOLN: INTRAVENOUS | Status: DC

## 2013-03-18 MED ORDER — METFORMIN HCL 500 MG PO TABS
1000.0000 mg | ORAL_TABLET | Freq: Two times a day (BID) | ORAL | Status: DC
  Filled 2013-03-18 (×3): qty 2

## 2013-03-18 MED ORDER — SODIUM CHLORIDE 0.9 % IR SOLN
80.0000 mg | Status: DC
  Filled 2013-03-18: qty 2

## 2013-03-18 MED ORDER — ATORVASTATIN CALCIUM 20 MG PO TABS
20.0000 mg | ORAL_TABLET | Freq: Every day | ORAL | Status: DC
  Administered 2013-03-18: 20 mg via ORAL
  Filled 2013-03-18 (×2): qty 1

## 2013-03-18 MED ORDER — CARVEDILOL 25 MG PO TABS
25.0000 mg | ORAL_TABLET | Freq: Two times a day (BID) | ORAL | Status: DC
  Administered 2013-03-18 – 2013-03-19 (×3): 25 mg via ORAL
  Filled 2013-03-18 (×5): qty 1

## 2013-03-18 MED ORDER — INSULIN GLARGINE 100 UNIT/ML ~~LOC~~ SOLN
45.0000 [IU] | Freq: Every day | SUBCUTANEOUS | Status: DC
  Administered 2013-03-18: 45 [IU] via SUBCUTANEOUS
  Filled 2013-03-18 (×2): qty 0.45

## 2013-03-18 MED ORDER — CEFAZOLIN SODIUM-DEXTROSE 2-3 GM-% IV SOLR
2.0000 g | INTRAVENOUS | Status: DC
  Filled 2013-03-18: qty 50

## 2013-03-18 MED ORDER — ASPIRIN 300 MG RE SUPP: 300.0000 mg | RECTAL | Status: AC

## 2013-03-18 MED ORDER — CHLORHEXIDINE GLUCONATE 4 % EX LIQD
60.0000 mL | Freq: Once | CUTANEOUS | Status: AC
  Administered 2013-03-18: 4 via TOPICAL
  Filled 2013-03-18: qty 60

## 2013-03-18 MED ORDER — CHLORHEXIDINE GLUCONATE 4 % EX LIQD
60.0000 mL | Freq: Once | CUTANEOUS | Status: DC
  Filled 2013-03-18: qty 60

## 2013-03-18 MED ORDER — ONDANSETRON HCL 4 MG/2ML IJ SOLN: 4.0000 mg | Freq: Four times a day (QID) | INTRAMUSCULAR | Status: DC | PRN

## 2013-03-18 MED ORDER — TRAZODONE HCL 100 MG PO TABS
100.0000 mg | ORAL_TABLET | Freq: Every evening | ORAL | Status: DC | PRN
  Filled 2013-03-18: qty 1

## 2013-03-18 MED ORDER — METFORMIN HCL 500 MG PO TABS
1000.0000 mg | ORAL_TABLET | Freq: Two times a day (BID) | ORAL | Status: DC
  Administered 2013-03-18 (×2): 1000 mg via ORAL
  Filled 2013-03-18 (×5): qty 2

## 2013-03-18 MED ORDER — RANOLAZINE ER 500 MG PO TB12
1000.0000 mg | ORAL_TABLET | Freq: Two times a day (BID) | ORAL | Status: DC
  Administered 2013-03-18 – 2013-03-19 (×3): 1000 mg via ORAL
  Filled 2013-03-18 (×5): qty 2

## 2013-03-18 MED ORDER — FLUOXETINE HCL 20 MG PO CAPS
40.0000 mg | ORAL_CAPSULE | Freq: Every day | ORAL | Status: DC
  Administered 2013-03-18 – 2013-03-19 (×2): 40 mg via ORAL
  Filled 2013-03-18 (×2): qty 2

## 2013-03-18 MED ORDER — ASPIRIN 81 MG PO CHEW
324.0000 mg | CHEWABLE_TABLET | ORAL | Status: AC
  Administered 2013-03-18: 324 mg via ORAL
  Filled 2013-03-18: qty 4

## 2013-03-18 MED ORDER — SODIUM CHLORIDE 0.9 % IV SOLN: INTRAVENOUS | Status: DC

## 2013-03-18 MED ORDER — FLUOXETINE HCL 40 MG PO CAPS: 40.0000 mg | ORAL_CAPSULE | Freq: Every day | ORAL | Status: DC

## 2013-03-18 MED ORDER — ACETAMINOPHEN 325 MG PO TABS: 650.0000 mg | ORAL_TABLET | ORAL | Status: DC | PRN

## 2013-03-18 MED ORDER — POTASSIUM CHLORIDE CRYS ER 20 MEQ PO TBCR
20.0000 meq | EXTENDED_RELEASE_TABLET | Freq: Every day | ORAL | Status: DC
  Administered 2013-03-18 – 2013-03-19 (×2): 20 meq via ORAL
  Filled 2013-03-18 (×2): qty 1

## 2013-03-18 MED ORDER — ENOXAPARIN SODIUM 40 MG/0.4ML ~~LOC~~ SOLN
40.0000 mg | SUBCUTANEOUS | Status: DC
  Filled 2013-03-18: qty 0.4

## 2013-03-18 MED ORDER — ISOSORBIDE MONONITRATE 15 MG HALF TABLET
15.0000 mg | ORAL_TABLET | Freq: Every day | ORAL | Status: DC
  Administered 2013-03-18 – 2013-03-19 (×2): 15 mg via ORAL
  Filled 2013-03-18 (×2): qty 1

## 2013-03-18 NOTE — Progress Notes (Signed)
SUBJECTIVE: The patient is doing well today.  At this time, he denies chest pain, shortness of breath, or any new concerns.  Marland Kitchen atorvastatin  20 mg Oral q1800  . carvedilol  25 mg Oral BID WC  . enoxaparin (LOVENOX) injection  40 mg Subcutaneous Q24H  . FLUoxetine  40 mg Oral Daily  . insulin glargine  45 Units Subcutaneous QHS  . irbesartan  75 mg Oral Daily  . isosorbide mononitrate  15 mg Oral Daily  . metFORMIN  1,000 mg Oral BID WC  . potassium chloride SA  20 mEq Oral Daily  . prasugrel  10 mg Oral Daily  . ranolazine  1,000 mg Oral BID      OBJECTIVE: Physical Exam: Filed Vitals:   03/18/13 0345 03/18/13 0400 03/18/13 0415 03/18/13 0621  BP:    172/111  Pulse: 64 70 62 71  Temp:    98 F (36.7 C)  TempSrc:    Oral  Resp: 14 23 14 17   Height:    6' (1.829 m)  Weight:    229 lb 0.9 oz (103.9 kg)  SpO2: 99% 98% 97% 99%   No intake or output data in the 24 hours ending 03/18/13 0905  Telemetry reveals sinus rhythm  GEN- The patient is well appearing, alert and oriented x 3 today.   Head- normocephalic, atraumatic Eyes-  Sclera clear, conjunctiva pink Ears- hearing intact Oropharynx- clear Neck- supple, no JVP Lymph- no cervical lymphadenopathy Lungs- Clear to ausculation bilaterally, normal work of breathing Heart- Regular rate and rhythm, no murmurs, rubs or gallops, PMI not laterally displaced GI- soft, NT, ND, + BS Extremities- no clubbing, cyanosis, or edema Skin- no rash or lesion Psych- euthymic mood, full affect Neuro- strength and sensation are intact  LABS: Basic Metabolic Panel:  Recent Labs  03/18/13 0021 03/18/13 0508  NA 138  --   K 3.5  --   CL 103  --   GLUCOSE 221*  --   BUN 18  --   CREATININE 1.10 1.09   Liver Function Tests: No results found for this basename: AST, ALT, ALKPHOS, BILITOT, PROT, ALBUMIN,  in the last 72 hours No results found for this basename: LIPASE, AMYLASE,  in the last 72 hours CBC:  Recent Labs  03/18/13 0007 03/18/13 0021 03/18/13 0508  WBC 7.5  --  7.0  HGB 13.9 13.3 13.9  HCT 38.0* 39.0 37.8*  MCV 83.0  --  83.6  PLT 227  --  211   RADIOLOGY: Dg Chest 2 View  03/18/2013  *RADIOLOGY REPORT*  Clinical Data: Pacemaker checked.  CHEST - 2 VIEW  Comparison: 03/28/2012  Findings: Stable appearance of cardiac pacemaker and mediastinal postoperative changes since the previous study. The heart size and pulmonary vascularity are normal. The lungs appear clear and expanded without focal air space disease or consolidation. No blunting of the costophrenic angles.  No pneumothorax.  Mediastinal contours appear intact.  Mild hyperinflation.  No significant change since previous study.  IMPRESSION: No evidence of active pulmonary disease.  Stable radiographic appearance of cardiac pacemaker since previous study.   Original Report Authenticated By: Lucienne Capers, M.D.     ASSESSMENT AND PLAN:   1. ICD at EOL battery status. Pt with vibratory alert from his ICD.  Unable to interrogated device due to EOL battery status. We will proceed with generator change in am.  Risks, benefits, and alteratives to the procedure were discussed at length with the patient who wishes to  proceed.  He has a Riata 7001 lead which should be fluored at the time of the procedure.  I have offered Analyze ST study to the patient and he is willing to consider.  I will have my research team discuss the protochol with him in the morning.  2. Ischemic CM euvolemic No symptoms of ischemia  3. No ischemic symptoms    Thompson Grayer, MD 03/18/2013 9:05 AM

## 2013-03-18 NOTE — Progress Notes (Signed)
Admitted pt to rm [id number] from ED, pt alert and oriented no c/o pain at this time, oriented to room, call bell placed within reach, admission assessment done orders carried out. Will continue to monitor. Filed Vitals:   03/18/13 0621  BP: 172/111  Pulse: 71  Temp: 98 F (36.7 C)  Resp: 17

## 2013-03-18 NOTE — ED Notes (Signed)
Pt resting at this time.  Awaiting consult with cardiologist.

## 2013-03-18 NOTE — ED Notes (Signed)
Per Rubin Payor, RN: Pacemaker interrogation with [place] was unsuccessful. Per St. [name]'s the buzzing sensation could have been Stage 1 of the end of life cycle of pacemaker, meaning it needs to be replaced in 2-3 months. Or it could have been the end of the life cycle for pacemaker, meaning it needs to be replaced now. MD Marnette Burgess made aware. Pt on cardiac monitor.

## 2013-03-18 NOTE — H&P (Signed)
[name] [name] is an 62 y.o. male.    Chief Complaint: Chest pain  HPI: 62 y/o male with ischemic cardiomyopathy requiring St. [name] ICD lead implanted in [place] presenting for evaluation after he experienced a buzzing sensation in his chest wall due to ICD at End Of Life (EOL).  He called his primary cardiologist office and was advised to present to the ER for further evaluation.  He has no symptoms.  Specifically, he denies chest pain, shortness of breath, PND orthopnea, palpitation or syncope.  In the ER, attempt was made to interrogate his device, but this was unsuccessful presumably due to ICD at EOL.  He is currently asymptomatic.   Past Medical History  Diagnosis Date  . Hypertension   . Hyperlipidemia   . GERD (gastroesophageal reflux disease)   . MVP (mitral valve prolapse)     s/p MV annuloplasty at [facility] 2004  . Ischemic cardiomyopathy     s/p St. [name] (Atlas) ICD implanted in [place] 2007  . Anxiety   . Systolic CHF     EF 99991111 by echo 2012  . CAD (coronary artery disease)     a.  s/p Stent to LAD '00;  b.  PTCA 2nd diagonal '10;  c. 02/18/12 Cath: moderate nonobs dzs - med rx  . Diabetes mellitus     type II uncontrolled  . PAF (paroxysmal atrial fibrillation)     noted on ICD interrogation '10  . Episodic mood disorder   . Chronic ischemic heart disease     Past Surgical History  Procedure Laterality Date  . Cardiac defibrillator placement      implanted in [place], has a 7001 RV lead and a SJM Atlas ICD followed by Dr Caryl Comes  . Coronary angioplasty    . Mitral valve repair    . Coronary stent placement      Family History  Problem Relation Age of Onset  . Coronary artery disease Mother   . Colon cancer Mother   . Heart attack Mother   . Heart disease Mother   . Heart failure Mother   . Hypertension Mother   . Malignant hyperthermia Mother   . Colon cancer Sister   . Coronary artery disease Brother   . Heart disease Brother     Social History:  reports that he has never smoked. He does not have any smokeless tobacco history on file. He reports that he does not drink alcohol or use illicit drugs.  Allergies:  Allergies  Allergen Reactions  . Lisinopril Cough   Medication: Atorvastatin 20 mg qhs Carvedilol 25 mg bid Prozac 40 mg qd  Imdur 30 mg qd Metformin 1000 mg bid  (Not in a hospital admission)  Results for orders placed during the hospital encounter of 03/17/13 (from the past 48 hour(s))  CBC     Status: Abnormal   Collection Time    03/18/13 12:07 AM      Result Value Range   WBC 7.5  4.0 - 10.5 K/uL   RBC 4.58  4.22 - 5.81 MIL/uL   Hemoglobin 13.9  13.0 - 17.0 g/dL   HCT 38.0 (*) 39.0 - 52.0 %   MCV 83.0  78.0 - 100.0 fL   MCH 30.3  26.0 - 34.0 pg   MCHC 36.6 (*) 30.0 - 36.0 g/dL   RDW 12.6  11.5 - 15.5 %   Platelets 227  150 - 400 K/uL  POCT I-STAT, CHEM 8     Status:  Abnormal   Collection Time    03/18/13 12:21 AM      Result Value Range   Sodium 138  135 - 145 mEq/L   Potassium 3.5  3.5 - 5.1 mEq/L   Chloride 103  96 - 112 mEq/L   BUN 18  6 - 23 mg/dL   Creatinine, Ser 1.10  0.50 - 1.35 mg/dL   Glucose, Bld 221 (*) 70 - 99 mg/dL   Calcium, Ion 1.16  1.13 - 1.30 mmol/L   TCO2 22  0 - 100 mmol/L   Hemoglobin 13.3  13.0 - 17.0 g/dL   HCT 39.0  39.0 - 52.0 %   Dg Chest 2 View  03/18/2013  *RADIOLOGY REPORT*  Clinical Data: Pacemaker checked.  CHEST - 2 VIEW  Comparison: 03/28/2012  Findings: Stable appearance of cardiac pacemaker and mediastinal postoperative changes since the previous study. The heart size and pulmonary vascularity are normal. The lungs appear clear and expanded without focal air space disease or consolidation. No blunting of the costophrenic angles.  No pneumothorax.  Mediastinal contours appear intact.  Mild hyperinflation.  No significant change since previous study.  IMPRESSION: No evidence of active pulmonary disease.  Stable radiographic appearance of cardiac  pacemaker since previous study.   Original Report Authenticated By: Lucienne Capers, M.D.     Review of Systems  Constitutional: Negative for fever, chills, weight loss, malaise/fatigue and diaphoresis.  HENT: Negative for hearing loss, ear pain, nosebleeds, congestion, sore throat, neck pain, tinnitus and ear discharge.   Eyes: Negative for blurred vision, double vision, photophobia, pain, discharge and redness.  Respiratory: Negative for cough, hemoptysis, sputum production, shortness of breath, wheezing and stridor.   Cardiovascular: Negative for chest pain, palpitations, orthopnea, claudication, leg swelling and PND.  Gastrointestinal: Negative for heartburn, nausea, vomiting, abdominal pain, diarrhea, constipation, blood in stool and melena.  Genitourinary: Negative for dysuria, urgency, frequency, hematuria and flank pain.  Musculoskeletal: Negative for myalgias.  Skin: Negative for itching and rash.  Neurological: Negative for dizziness, tingling, tremors, sensory change, speech change, focal weakness, seizures, weakness and headaches.  Endo/Heme/Allergies: Negative for polydipsia.  Psychiatric/Behavioral: Negative for hallucinations.    Blood pressure 165/91, pulse 63, temperature 97.4 F (36.3 C), temperature source Oral, resp. rate 12, SpO2 95.00%. Physical Exam  Constitutional: He is oriented to person, place, and time. He appears well-developed and well-nourished. No distress.  HENT:  Head: Normocephalic and atraumatic.  Eyes: EOM are normal. Pupils are equal, round, and reactive to light. Right eye exhibits no discharge. Left eye exhibits no discharge. No scleral icterus.  Neck: Normal range of motion. Neck supple. No JVD present. No tracheal deviation present.  Cardiovascular: Normal rate, regular rhythm and normal heart sounds.  Exam reveals no gallop and no friction rub.   No murmur heard. Respiratory: Effort normal and breath sounds normal. No stridor. No respiratory  distress. He has no wheezes. He has no rales.  GI: Soft. Bowel sounds are normal. He exhibits no distension. There is no tenderness. There is no rebound and no guarding.  Musculoskeletal: He exhibits no edema and no tenderness.  Lymphadenopathy:    He has no cervical adenopathy.  Neurological: He is alert and oriented to person, place, and time.  Skin: No rash noted. He is not diaphoretic. No erythema.  Psychiatric: He has a normal mood and affect.     Assessment/Plan  1. ICD at EOL: patient will be admitted to cardiology service to be observed on telemetry.  He will be  seen by EP tomorrow to determine when his device will be changed.  2.  CAD s/p multi-vessel stents.  We will continue ASA, Lipitor and beta-blockers  3.  DM: patient is on oral hypoglycemic agents.    Jyden Kromer E 03/18/2013, 3:57 AM

## 2013-03-18 NOTE — ED Notes (Signed)
Report given to nurse on floor.  Pt transported to floor.

## 2013-03-19 ENCOUNTER — Encounter (HOSPITAL_COMMUNITY)
Admission: EM | Disposition: A | Discharge status: Home / Self Care | Payer: Self-pay | Source: Home / Self Care | Attending: Emergency Medicine

## 2013-03-19 DIAGNOSIS — I428 Other cardiomyopathies: Secondary | ICD-10-CM

## 2013-03-19 DIAGNOSIS — E119 Type 2 diabetes mellitus without complications: Secondary | ICD-10-CM

## 2013-03-19 HISTORY — DX: Type 2 diabetes mellitus without complications: E11.9

## 2013-03-19 HISTORY — PX: IMPLANTABLE CARDIOVERTER DEFIBRILLATOR (ICD) GENERATOR CHANGE: SHX5469

## 2013-03-19 LAB — GLUCOSE, CAPILLARY: Glucose-Capillary: 173 mg/dL — ABNORMAL HIGH (ref 70–99)

## 2013-03-19 SURGERY — ICD GENERATOR CHANGE
Anesthesia: LOCAL

## 2013-03-19 MED ORDER — CEFAZOLIN SODIUM 1-5 GM-% IV SOLN
INTRAVENOUS | Status: AC
  Filled 2013-03-19: qty 100

## 2013-03-19 MED ORDER — LIDOCAINE HCL (PF) 1 % IJ SOLN
INTRAMUSCULAR | Status: AC
  Filled 2013-03-19: qty 60

## 2013-03-19 MED ORDER — MIDAZOLAM HCL 5 MG/5ML IJ SOLN
INTRAMUSCULAR | Status: AC
  Filled 2013-03-19: qty 5

## 2013-03-19 MED ORDER — ACETAMINOPHEN 325 MG PO TABS: 325.0000 mg | ORAL_TABLET | ORAL | Status: DC | PRN

## 2013-03-19 MED ORDER — FENTANYL CITRATE 0.05 MG/ML IJ SOLN
INTRAMUSCULAR | Status: AC
  Filled 2013-03-19: qty 2

## 2013-03-19 MED ORDER — ONDANSETRON HCL 4 MG/2ML IJ SOLN: 4.0000 mg | Freq: Four times a day (QID) | INTRAMUSCULAR | Status: DC | PRN

## 2013-03-19 NOTE — Discharge Summary (Signed)
ELECTROPHYSIOLOGY DISCHARGE SUMMARY    Patient ID: [name] [name],  MRN: [id number], DOB/AGE: 03-24-1951 62 y.o.  Admit date: 03/17/2013 Discharge date: 03/19/2013  Primary Care Physician: [name] Cliche, MD Primary Cardiologist: [place], MD Primary EP: [name] Le, MD  Primary Discharge Diagnosis:  1. ICD battery at EOL, now s/p ICD generator change  Secondary Discharge Diagnoses:  1. HTN 2. Dyslipidemia 3. Ischemic CM s/p ICD implant 2007 4. Chronic systolic HF 5. CAD 6. PAF 7. MVP s/p MV annuloplasty 2004 8. DM 9. Anxiety  Procedures This Admission:  03/19/2013 Procedure : ICD removal and insertion of a new device (St. [name] - Analyze trial) with DFT testing and ICD pocket revision.  Findings: DFT 20 Joules.   History and Hospital Course:  [name] [name] is a 62 year old man with an ischemic cardiomyopathy s/p St. [name] ICD implant in 2007 presenting for evaluation after he experienced a "buzzing sensation" in his chest wall at his device site. He called our office and was advised to present to the ED for further evaluation. He had no symptoms and specifically denied chest pain, shortness of breath, PND orthopnea, palpitation or syncope. In the ED, attempt was made to interrogate his device but this was unsuccessful, presumably due to ICD battery at EOL. He was admitted to telemetry and underwent ICD generator change this morning. He tolerated this procedure well without any immediate complication. He remains hemodynamically stable and afebrile. He has been seen, examined and deemed stable for discharge today by Dr. Cristopher [name].   Discharge Vitals: Blood pressure 128/85, pulse 70, temperature 97.4 F (36.3 C), temperature source Axillary, resp. rate 20, height 6' (1.829 m), weight 232 lb 9.4 oz (105.5 kg), SpO2 99.00%.   Labs: Lab Results  Component Value Date   WBC 7.0 03/18/2013   HGB 13.9 03/18/2013   HCT 37.8* 03/18/2013   MCV 83.6 03/18/2013   PLT 211 03/18/2013     Recent  Labs Lab 03/18/13 0021 03/18/13 0508  NA 138  --   K 3.5  --   CL 103  --   BUN 18  --   CREATININE 1.10 1.09  GLUCOSE 221*  --     Disposition:  The patient is being discharged in stable condition.  Follow-up:     Follow-up Information   Follow up with [facility] Device 1 On 04/02/2013. (At 2:30 PM for wound check)    Contact information:   [address] [place] [address] [phone]      Follow up with [name] Peru, MD In 3 months. (Our office will schedule/mail a reminder letter)    Contact information:   [address] [place] [address] [phone]      Discharge Medications:    Medication List    TAKE these medications       atorvastatin 20 MG tablet  Commonly known as:  LIPITOR  Take 20 mg by mouth daily.     carvedilol 25 MG tablet  Commonly known as:  COREG  Take 25 mg by mouth 2 (two) times daily with a meal.     EFFIENT 10 MG Tabs  Generic drug:  prasugrel  Take 10 mg by mouth daily.     esomeprazole 40 MG capsule  Commonly known as:  NEXIUM  Take 40 mg by mouth daily before breakfast.     FLUoxetine 40 MG capsule  Commonly known as:  PROZAC  Take 40 mg by mouth daily.  insulin glargine 100 UNIT/ML injection  Commonly known as:  LANTUS  Inject 20 Units into the skin daily.     insulin glargine 100 UNIT/ML injection  Commonly known as:  LANTUS  Inject 45 Units into the skin at bedtime.     isosorbide mononitrate 30 MG 24 hr tablet  Commonly known as:  IMDUR  Take 15 mg by mouth daily.     metFORMIN 500 MG tablet  Commonly known as:  GLUCOPHAGE  Take 2 tablets (1,000 mg total) by mouth 2 (two) times daily with a meal.     metFORMIN 500 MG tablet  Commonly known as:  GLUCOPHAGE  Take 1,000 mg by mouth 2 (two) times daily with a meal.     niacin 500 MG CR tablet  Commonly known as:  NIASPAN  Take 500 mg by mouth at bedtime.     nitroGLYCERIN 0.4 MG SL tablet  Commonly known as:   NITROSTAT  Place 1 tablet (0.4 mg total) under the tongue every 5 (five) minutes x 3 doses as needed for chest pain.     potassium chloride SA 20 MEQ tablet  Commonly known as:  K-DUR,KLOR-CON  Take 20 mEq by mouth daily.     RANEXA 1000 MG SR tablet  Generic drug:  ranolazine  Take 1,000 mg by mouth 2 (two) times daily.     traZODone 100 MG tablet  Commonly known as:  DESYREL  Take 100 mg by mouth at bedtime as needed for sleep.     valsartan 160 MG tablet  Commonly known as:  DIOVAN  Take 80 mg by mouth daily.       Duration of Discharge Encounter: Greater than 30 minutes including physician time.  Signed, Ileene Hutchinson, PA-C 03/19/2013, 11:41 AM

## 2013-03-19 NOTE — H&P (View-Only) (Signed)
SUBJECTIVE: The patient is doing well today.  At this time, he denies chest pain, shortness of breath, or any new concerns.  Marland Kitchen atorvastatin  20 mg Oral q1800  . carvedilol  25 mg Oral BID WC  . enoxaparin (LOVENOX) injection  40 mg Subcutaneous Q24H  . FLUoxetine  40 mg Oral Daily  . insulin glargine  45 Units Subcutaneous QHS  . irbesartan  75 mg Oral Daily  . isosorbide mononitrate  15 mg Oral Daily  . metFORMIN  1,000 mg Oral BID WC  . potassium chloride SA  20 mEq Oral Daily  . prasugrel  10 mg Oral Daily  . ranolazine  1,000 mg Oral BID      OBJECTIVE: Physical Exam: Filed Vitals:   03/18/13 0345 03/18/13 0400 03/18/13 0415 03/18/13 0621  BP:    172/111  Pulse: 64 70 62 71  Temp:    98 F (36.7 C)  TempSrc:    Oral  Resp: 14 23 14 17   Height:    6' (1.829 m)  Weight:    229 lb 0.9 oz (103.9 kg)  SpO2: 99% 98% 97% 99%   No intake or output data in the 24 hours ending 03/18/13 0905  Telemetry reveals sinus rhythm  GEN- The patient is well appearing, alert and oriented x 3 today.   Head- normocephalic, atraumatic Eyes-  Sclera clear, conjunctiva pink Ears- hearing intact Oropharynx- clear Neck- supple, no JVP Lymph- no cervical lymphadenopathy Lungs- Clear to ausculation bilaterally, normal work of breathing Heart- Regular rate and rhythm, no murmurs, rubs or gallops, PMI not laterally displaced GI- soft, NT, ND, + BS Extremities- no clubbing, cyanosis, or edema Skin- no rash or lesion Psych- euthymic mood, full affect Neuro- strength and sensation are intact  LABS: Basic Metabolic Panel:  Recent Labs  03/18/13 0021 03/18/13 0508  NA 138  --   K 3.5  --   CL 103  --   GLUCOSE 221*  --   BUN 18  --   CREATININE 1.10 1.09   Liver Function Tests: No results found for this basename: AST, ALT, ALKPHOS, BILITOT, PROT, ALBUMIN,  in the last 72 hours No results found for this basename: LIPASE, AMYLASE,  in the last 72 hours CBC:  Recent Labs  03/18/13 0007 03/18/13 0021 03/18/13 0508  WBC 7.5  --  7.0  HGB 13.9 13.3 13.9  HCT 38.0* 39.0 37.8*  MCV 83.0  --  83.6  PLT 227  --  211   RADIOLOGY: Dg Chest 2 View  03/18/2013  *RADIOLOGY REPORT*  Clinical Data: Pacemaker checked.  CHEST - 2 VIEW  Comparison: 03/28/2012  Findings: Stable appearance of cardiac pacemaker and mediastinal postoperative changes since the previous study. The heart size and pulmonary vascularity are normal. The lungs appear clear and expanded without focal air space disease or consolidation. No blunting of the costophrenic angles.  No pneumothorax.  Mediastinal contours appear intact.  Mild hyperinflation.  No significant change since previous study.  IMPRESSION: No evidence of active pulmonary disease.  Stable radiographic appearance of cardiac pacemaker since previous study.   Original Report Authenticated By: Lucienne Capers, M.D.     ASSESSMENT AND PLAN:   1. ICD at EOL battery status. Pt with vibratory alert from his ICD.  Unable to interrogated device due to EOL battery status. We will proceed with generator change in am.  Risks, benefits, and alteratives to the procedure were discussed at length with the patient who wishes to  proceed.  He has a Riata 7001 lead which should be fluored at the time of the procedure.  I have offered Analyze ST study to the patient and he is willing to consider.  I will have my research team discuss the protochol with him in the morning.  2. Ischemic CM euvolemic No symptoms of ischemia  3. No ischemic symptoms    Thompson Grayer, MD 03/18/2013 9:05 AM

## 2013-03-19 NOTE — Progress Notes (Signed)
Brief Nutrition Note:  RD pulled to pt for malnutrition screening tool report. Pt indicated unintentional weight loss.  RD spoke with pt about weight loss, states he has been losing weight slowely over the past 6 years. Weight loss was intentional with increased physical activity. Pt with good appetite. Some additional weight loss would be beneficial fr this pt.  Body mass index is 31.54 kg/(m^2). obesity class 1 Wt Readings from Last 5 Encounters:  03/19/13 232 lb 9.4 oz (105.5 kg)  03/19/13 232 lb 9.4 oz (105.5 kg)  12/29/12 241 lb 6.4 oz (109.498 kg)  09/15/12 255 lb 1.9 oz (115.722 kg)  03/17/12 243 lb (110.224 kg)   Diet: Carb Mod. Eating 100% of meals.   Chart reviewed. No nutrition interventions warranted at this time. Please consult if additional nutrition needs arise.   Orson Slick RD, LDN Pager [phone] After Hours pager [phone]

## 2013-03-19 NOTE — Progress Notes (Signed)
   ELECTROPHYSIOLOGY ROUNDING NOTE    Patient Name: [name] [name] Date of Encounter: 03-19-2013    SUBJECTIVE:Patient feels well.  No chest pain or shortness of breath.  Pending ICD gen change for EOL battery status today.  TELEMETRY: Reviewed telemetry pt in sinus rhythm with intermittent AV pacing Filed Vitals:   03/18/13 1428 03/18/13 2050 03/19/13 0230 03/19/13 0500  BP: 103/65 110/75 100/58 128/85  Pulse: 67 61 56 70  Temp: 97.8 F (36.6 C) 98.2 F (36.8 C) 97.3 F (36.3 C) 97.4 F (36.3 C)  TempSrc: Oral Oral Oral Axillary  Resp: 19 18 20 20   Height:      Weight:    232 lb 9.4 oz (105.5 kg)  SpO2: 99% 99% 99% 99%    Intake/Output Summary (Last 24 hours) at 03/19/13 0600 Last data filed at 03/18/13 1755  Gross per 24 hour  Intake    720 ml  Output      0 ml  Net    720 ml    LABS: Basic Metabolic Panel:  Recent Labs  03/18/13 0021 03/18/13 0508  NA 138  --   K 3.5  --   CL 103  --   GLUCOSE 221*  --   BUN 18  --   CREATININE 1.10 1.09   CBC:  Recent Labs  03/18/13 0007 03/18/13 0021 03/18/13 0508  WBC 7.5  --  7.0  HGB 13.9 13.3 13.9  HCT 38.0* 39.0 37.8*  MCV 83.0  --  83.6  PLT 227  --  211   Hemoglobin A1C:  Recent Labs  03/18/13 0508  HGBA1C 12.5*   Thyroid Function Tests:  Recent Labs  03/18/13 0508  TSH 2.884    Radiology/Studies:  Dg Chest 2 View 03/18/2013  *RADIOLOGY REPORT*  Clinical Data: Pacemaker checked.  CHEST - 2 VIEW  Comparison: 03/28/2012  Findings: Stable appearance of cardiac pacemaker and mediastinal postoperative changes since the previous study. The heart size and pulmonary vascularity are normal. The lungs appear clear and expanded without focal air space disease or consolidation. No blunting of the costophrenic angles.  No pneumothorax.  Mediastinal contours appear intact.  Mild hyperinflation.  No significant change since previous study.  IMPRESSION: No evidence of active pulmonary disease.  Stable  radiographic appearance of cardiac pacemaker since previous study.   Original Report Authenticated By: Lucienne Capers, M.D.    PHYSICAL EXAM Well appearing 62 yo man, NAD HEENT: Unremarkable Neck:  7 cm JVD, no thyromegally Lungs:  Clear with no wheezes HEART:  Regular rate rhythm, no murmurs, no rubs, no clicks Abd:  soft, positive bowel sounds, no organomegally, no rebound, no guarding Ext:  2 plus pulses, no edema, no cyanosis, no clubbing Skin:  No rashes no nodules Neuro:  CN II through XII intact, motor grossly intact    Active Problems:   ICD (implantable cardiac defibrillator) battery depletion  chronic systolic chf, class 2 HTN PAF Rec: I have discussed the risks/benefits/goals/expectations with the patient and he wishes to proceed with ICD removal and insertion of a new device.  Mikle Bosworth.D.

## 2013-03-19 NOTE — Progress Notes (Signed)
Inpatient Diabetes Program Recommendations  AACE/ADA: New Consensus Statement on Inpatient Glycemic Control (2013)  Target Ranges:  Prepandial:   less than 140 mg/dL      Peak postprandial:   less than 180 mg/dL (1-2 hours)      Critically ill patients:  140 - 180 mg/dL  Noted patient regimen of Lantus and metformin both here and at home. Noted pt to have discharge orders for today. If pt stays here, please ad some correction scale to meals and at HS. HgbA1C is 12.5 %, average glucose of 315 mg/dL. Please report to his primary MD in order for patient to either get his home meds altered or for OP education.   Please also include DM2 on in-hospital problem list.  Thank you, Rosita Kea, RN, CNS, Diabetes Coordinator [phone])

## 2013-03-19 NOTE — Interval H&P Note (Signed)
History and Physical Interval Note: he has a long h/o CHF s/p ICD 2007 with class 2 symptoms. His EF has been variable since his implant.  03/19/2013 10:16 AM  [name] [name]  has presented today for surgery, with the diagnosis of ERI  The various methods of treatment have been discussed with the patient and family. After consideration of risks, benefits and other options for treatment, the patient has consented to  Procedure(s): ICD GENERATOR CHANGE (N/A) as a surgical intervention .  The patient's history has been reviewed, patient examined, no change in status, stable for surgery.  I have reviewed the patient's chart and labs.  Questions were answered to the patient's satisfaction.     Cristopher Peru

## 2013-03-19 NOTE — Research (Signed)
ANALYZE Informed Consent   Subject Name: [name] [name]  Subject met inclusion and exclusion criteria.  The informed consent form, study requirements and expectations were reviewed with the subject and questions and concerns were addressed prior to the signing of the consent form.  The subject verbalized understanding of the trail requirements.  The subject agreed to participate in the ANALYZE trial and signed the informed consent.  The informed consent was obtained prior to performance of any protocol-specific procedures for the subject.  A copy of the signed informed consent was given to the subject and a copy was placed in the subject's medical record.  Pamala Duffel 03/19/2013, 08:45AM

## 2013-03-19 NOTE — Interval H&P Note (Signed)
History and Physical Interval Note:  03/19/2013 9:13 AM  [name] [name]  has presented today for surgery, with the diagnosis of ERI  The various methods of treatment have been discussed with the patient and family. After consideration of risks, benefits and other options for treatment, the patient has consented to  Procedure(s): ICD GENERATOR CHANGE (N/A) as a surgical intervention .  The patient's history has been reviewed, patient examined, no change in status, stable for surgery.  I have reviewed the patient's chart and labs.  Questions were answered to the patient's satisfaction.     Cristopher Peru

## 2013-03-19 NOTE — Op Note (Signed)
EP Procedure note Procedure : ICD removal and insertion of a new device with DFT testing and ICD pocket revision. Findings: DFT 20 Joules. Complications: none immediate. [place].D.

## 2013-03-20 ENCOUNTER — Telehealth: Payer: Self-pay | Admitting: *Deleted

## 2013-03-20 NOTE — Telephone Encounter (Signed)
S/w pt is doing better since hospital, not prescribed any new meds, taking bandage off today and has a f/u appt. 4/21 with device check. Pt said he would call if pt had any problems

## 2013-03-20 NOTE — Op Note (Signed)
NAMEJAMAUL, FENDLEY NO.:  [id number]  MEDICAL RECORD NO.:  [id number]  LOCATION:  4707                         FACILITY:  [facility]  PHYSICIAN:  Champ Mungo. Lovena Le, MD    DATE OF BIRTH:  09-May-1951  DATE OF PROCEDURE:  03/19/2013 DATE OF DISCHARGE:  03/19/2013                              OPERATIVE REPORT   PROCEDURE PERFORMED:  Removal of previously implanted ICD and insertion of a new ICD with defibrillation threshold testing and ICD pocket revision.  INTRODUCTION:  The patient is a 62 year old male with longstanding nonischemic cardiomyopathy, chronic systolic heart failure Class-2, status post ICD implantation, which was implanted in [place] in 2007. He has reached end of life on his ICD, and is now referred for device removal and insertion of a new one.  OPERATIVE PROCEDURE:  After informed consent was obtained, the patient was taken to the diagnostic EP lab in a fasting state.  After usual preparation and draping, intravenous fentanyl and midazolam was given for sedation.  A 30 mL of lidocaine was infiltrated into the left infraclavicular region.  A 7-cm incision was carried out over this region.  Electrocautery was utilized to dissect down to the fascial plane.  The ICD pocket was entered with electrocautery and the generator was removed with gentle traction.  Initially, the rate sense portion of the defibrillator lead was stopped but after much work it was eventually freed up from the old device.  The leads were evaluated and found to be working satisfactorily.  At this point, the ICD pocket was revised to accommodate a larger size device.  The new St. [name] dual-chamber ICD serial number [id number] was connected to the old atrial and RV leads and placed back in the subcutaneous pocket.  The pocket was irrigated with antibiotic irrigation.  The incision was closed with 2-0 and 3-0 Vicryl. At this point, I scrubbed out of the case and supervised  defibrillation threshold testing.  After the patient more deeply sedated under my direct supervision with intravenous fentanyl, midazolam, ventricular fibrillation was induced with a T-wave shock.  A 20-joule shock was then delivered terminating ventricular fibrillation and restored sinus rhythm.  No additional defibrillation threshold testing was carried out.  Benzoin, Steri-Strips were painted on the skin.  A pressure dressing was applied.  The patient was returned to his room in satisfactory condition.  COMPLICATIONS:  There were no immediate complications for results demonstrate successful implantation of a new St. [name] dual-chamber ICD in a patient with a nonischemic cardiomyopathy, chronic Class 2 systolic heart failure.     Champ Mungo. Lovena Le, MD    GWT/MEDQ  D:  03/19/2013  T:  03/20/2013  Job:  [id number]

## 2013-04-02 ENCOUNTER — Ambulatory Visit: Payer: No Typology Code available for payment source

## 2013-04-05 ENCOUNTER — Ambulatory Visit: Payer: No Typology Code available for payment source

## 2013-04-16 ENCOUNTER — Encounter: Payer: Self-pay | Admitting: Internal Medicine

## 2013-04-16 ENCOUNTER — Ambulatory Visit (INDEPENDENT_AMBULATORY_CARE_PROVIDER_SITE_OTHER): Payer: No Typology Code available for payment source | Admitting: *Deleted

## 2013-04-16 ENCOUNTER — Other Ambulatory Visit: Payer: Self-pay | Admitting: Internal Medicine

## 2013-04-16 DIAGNOSIS — I2589 Other forms of chronic ischemic heart disease: Secondary | ICD-10-CM

## 2013-04-16 DIAGNOSIS — I255 Ischemic cardiomyopathy: Secondary | ICD-10-CM

## 2013-04-16 LAB — ICD DEVICE OBSERVATION
AL IMPEDENCE ICD: 440 Ohm
AL THRESHOLD: 0.5 V
ATRIAL PACING ICD: 1 pct
BAMS-0003: 70 {beats}/min
CHARGE TIME: 8.4 s
DEV-0020ICD: NEGATIVE
DEV-0020ICD: NEGATIVE
DEVICE MODEL ICD: 1056971
DEVICE MODEL ICD: 345070
HV IMPEDENCE: 47 Ohm
TZON-0003SLOWVT: 400 ms
TZON-0004SLOWVT: 16
TZON-0005SLOWVT: 6
TZST-0002SLOWVT: NEGATIVE
VENTRICULAR PACING ICD: 1 pct

## 2013-04-16 NOTE — Progress Notes (Signed)
icd check in clinic. Normal device function. ROV in 3 months w/JA.

## 2013-04-23 ENCOUNTER — Encounter: Payer: Self-pay | Admitting: *Deleted

## 2013-06-18 ENCOUNTER — Encounter: Payer: No Typology Code available for payment source | Admitting: Internal Medicine

## 2013-07-25 ENCOUNTER — Encounter: Payer: Self-pay | Admitting: Internal Medicine

## 2013-07-25 ENCOUNTER — Ambulatory Visit (INDEPENDENT_AMBULATORY_CARE_PROVIDER_SITE_OTHER): Payer: Medicare Other | Admitting: Internal Medicine

## 2013-07-25 VITALS — BP 140/108 | HR 82 | Ht 72.0 in | Wt 236.0 lb

## 2013-07-25 DIAGNOSIS — I255 Ischemic cardiomyopathy: Secondary | ICD-10-CM

## 2013-07-25 DIAGNOSIS — I519 Heart disease, unspecified: Secondary | ICD-10-CM

## 2013-07-25 DIAGNOSIS — Z95810 Presence of automatic (implantable) cardiac defibrillator: Secondary | ICD-10-CM

## 2013-07-25 DIAGNOSIS — I2510 Atherosclerotic heart disease of native coronary artery without angina pectoris: Secondary | ICD-10-CM

## 2013-07-25 DIAGNOSIS — I2589 Other forms of chronic ischemic heart disease: Secondary | ICD-10-CM

## 2013-07-25 HISTORY — DX: Heart disease, unspecified: I51.9

## 2013-07-25 LAB — ICD DEVICE OBSERVATION
AL IMPEDENCE ICD: 475 Ohm
ATRIAL PACING ICD: 0.68 pct
BAMS-0003: 60 {beats}/min
DEV-0020ICD: NEGATIVE
FVT: 0
RV LEAD AMPLITUDE: 11.9 mv
RV LEAD IMPEDENCE ICD: 387.5 Ohm
TOT-0008: 0
TOT-0009: 0

## 2013-07-25 NOTE — Progress Notes (Signed)
PCP: [facility] Angelique Blonder., MD Primary Cardiologist:  Dr Martinique  [name] [name] is a 62 y.o. male who presents today for routine electrophysiology followup.  Since his generator change 4/14, the patient reports doing very well.  Today, he denies symptoms of palpitations, chest pain, shortness of breath,  lower extremity edema, dizziness, presyncope, syncope, or ICD shocks.  The patient is otherwise without complaint today.   Past Medical History  Diagnosis Date  . Hypertension   . Hyperlipidemia   . GERD (gastroesophageal reflux disease)   . MVP (mitral valve prolapse)     s/p MV annuloplasty at [facility] 2004  . Ischemic cardiomyopathy     s/p St. [name] (Atlas) ICD implanted in [place] 2007  . Anxiety   . CAD (coronary artery disease)     a.  s/p Stent to LAD '00;  b.  PTCA 2nd diagonal '10;  c. 02/18/12 Cath: moderate nonobs dzs - med rx  . Diabetes mellitus     type II uncontrolled  . PAF (paroxysmal atrial fibrillation)     noted on ICD interrogation '10  . Episodic mood disorder   . Chronic ischemic heart disease   . Depression   . ICD (implantable cardiac defibrillator) in place    Past Surgical History  Procedure Laterality Date  . Cardiac defibrillator placement      implanted in [place], has a 7001 RV lead and a SJM Atlas ICD followed by Dr Caryl Comes  . Coronary angioplasty    . Mitral valve repair    . Coronary stent placement    . Cardiac valve replacement    . Implantable cardioverter defibrillator generator change  03/19/13    SJM Fortify ST DR generator placed by Dr Lovena Le, part of Analyze ST study    Current Outpatient Prescriptions  Medication Sig Dispense Refill  . atorvastatin (LIPITOR) 20 MG tablet Take 20 mg by mouth daily.        . carvedilol (COREG) 25 MG tablet Take 25 mg by mouth 2 (two) times daily with a meal.        . esomeprazole (NEXIUM) 40 MG capsule Take 40 mg by mouth daily before breakfast.        . FLUoxetine (PROZAC) 40 MG capsule Take 40  mg by mouth daily.        . insulin glargine (LANTUS) 100 UNIT/ML injection Inject 45 Units into the skin at bedtime.      . isosorbide mononitrate (IMDUR) 30 MG 24 hr tablet Take 15 mg by mouth daily.       . metFORMIN (GLUCOPHAGE) 1000 MG tablet Take 1,000 mg by mouth 2 (two) times daily with a meal.      . niacin (NIASPAN) 500 MG CR tablet Take 500 mg by mouth at bedtime.        . nitroGLYCERIN (NITROSTAT) 0.4 MG SL tablet Place 1 tablet (0.4 mg total) under the tongue every 5 (five) minutes x 3 doses as needed for chest pain.  25 tablet  3  . potassium chloride SA (K-DUR,KLOR-CON) 20 MEQ tablet Take 20 mEq by mouth daily.        . prasugrel (EFFIENT) 10 MG TABS Take 10 mg by mouth daily.        . ranolazine (RANEXA) 1000 MG SR tablet Take 1,000 mg by mouth 2 (two) times daily.        . traZODone (DESYREL) 100 MG tablet Take 100 mg by mouth at bedtime as needed for sleep.       Marland Kitchen  valsartan (DIOVAN) 160 MG tablet Take 80 mg by mouth daily.       . insulin glargine (LANTUS) 100 UNIT/ML injection Inject 20 Units into the skin daily.      . metFORMIN (GLUCOPHAGE) 500 MG tablet Take 2 tablets (1,000 mg total) by mouth 2 (two) times daily with a meal.  60 tablet  3   No current facility-administered medications for this visit.    Physical Exam: Filed Vitals:   07/25/13 0949  BP: 140/108  Pulse: 82  Height: 6' (1.829 m)  Weight: 236 lb (107.049 kg)    GEN- The patient is well appearing, alert and oriented x 3 today.   Head- normocephalic, atraumatic Eyes-  Sclera clear, conjunctiva pink Ears- hearing intact Oropharynx- clear Lungs- Clear to ausculation bilaterally, normal work of breathing Chest- ICD pocket is well healed Heart- Regular rate and rhythm, no murmurs, rubs or gallops, PMI not laterally displaced GI- soft, NT, ND, + BS Extremities- no clubbing, cyanosis, or edema  ICD interrogation- reviewed in detail today,  See PACEART report  Assessment and Plan:  1.  Chronic  systolic dysfunction euvolemic today Stable on an appropriate medical regimen Normal ICD function See Pace Art report No changes today  Return to see Jerene Pitch in the device clinic in 6 months I will see again in 1year

## 2013-07-25 NOTE — Patient Instructions (Addendum)
Your physician wants you to follow-up in: [place] [facility] You will receive a reminder letter in the mail two months in advance. If you don't receive a letter, please call our office to schedule the follow-up appointment.   Your physician wants you to follow-up in: [place] will receive a reminder letter in the mail two months in advance. If you don't receive a letter, please call our office to schedule the follow-up appointment.

## 2013-10-18 ENCOUNTER — Other Ambulatory Visit: Payer: Self-pay

## 2013-10-29 ENCOUNTER — Ambulatory Visit (INDEPENDENT_AMBULATORY_CARE_PROVIDER_SITE_OTHER): Payer: Medicare Other | Admitting: *Deleted

## 2013-10-29 DIAGNOSIS — Z95810 Presence of automatic (implantable) cardiac defibrillator: Secondary | ICD-10-CM

## 2013-10-29 DIAGNOSIS — I255 Ischemic cardiomyopathy: Secondary | ICD-10-CM

## 2013-10-29 DIAGNOSIS — I2589 Other forms of chronic ischemic heart disease: Secondary | ICD-10-CM

## 2013-10-30 LAB — MDC_IDC_ENUM_SESS_TYPE_REMOTE
Battery Remaining Longevity: 84 mo
Brady Statistic RA Percent Paced: 1 %
HighPow Impedance: 47 Ohm
Implantable Pulse Generator Serial Number: 1056971
Lead Channel Pacing Threshold Amplitude: 1.25 V
Lead Channel Pacing Threshold Pulse Width: 0.5 ms
Lead Channel Sensing Intrinsic Amplitude: 11.9 mV
Lead Channel Setting Pacing Pulse Width: 0.5 ms
Zone Setting Detection Interval: 310 ms

## 2013-11-05 ENCOUNTER — Encounter: Payer: Self-pay | Admitting: *Deleted

## 2013-11-06 ENCOUNTER — Encounter: Payer: Self-pay | Admitting: Internal Medicine

## 2014-01-01 ENCOUNTER — Encounter: Payer: Medicare Other | Admitting: Cardiology

## 2014-01-15 ENCOUNTER — Encounter: Payer: Self-pay | Admitting: Cardiology

## 2014-01-15 ENCOUNTER — Encounter: Payer: Self-pay | Admitting: Internal Medicine

## 2014-01-15 ENCOUNTER — Ambulatory Visit (INDEPENDENT_AMBULATORY_CARE_PROVIDER_SITE_OTHER): Payer: Medicare Other | Admitting: Cardiology

## 2014-01-15 VITALS — BP 152/100 | HR 79 | Wt 232.0 lb

## 2014-01-15 DIAGNOSIS — I2510 Atherosclerotic heart disease of native coronary artery without angina pectoris: Secondary | ICD-10-CM

## 2014-01-15 DIAGNOSIS — Z95810 Presence of automatic (implantable) cardiac defibrillator: Secondary | ICD-10-CM

## 2014-01-15 DIAGNOSIS — I255 Ischemic cardiomyopathy: Secondary | ICD-10-CM

## 2014-01-15 DIAGNOSIS — I2589 Other forms of chronic ischemic heart disease: Secondary | ICD-10-CM

## 2014-01-15 LAB — MDC_IDC_ENUM_SESS_TYPE_INCLINIC
Implantable Pulse Generator Serial Number: 1056971
Lead Channel Impedance Value: 380 Ohm
Lead Channel Impedance Value: 450 Ohm
Lead Channel Pacing Threshold Pulse Width: 0.5 ms
Lead Channel Pacing Threshold Pulse Width: 0.5 ms
Lead Channel Sensing Intrinsic Amplitude: 11.9 mV
Lead Channel Setting Pacing Amplitude: 2 V
Lead Channel Setting Pacing Amplitude: 2.5 V
Lead Channel Setting Pacing Pulse Width: 0.5 ms
Lead Channel Setting Sensing Sensitivity: 0.5 mV
MDC IDC MSMT LEADCHNL RA PACING THRESHOLD AMPLITUDE: 0.5 V
MDC IDC MSMT LEADCHNL RA SENSING INTR AMPL: 3.1 mV
MDC IDC MSMT LEADCHNL RV PACING THRESHOLD AMPLITUDE: 1.25 V
MDC IDC SET ZONE DETECTION INTERVAL: 310 ms
MDC IDC STAT BRADY RA PERCENT PACED: 1 % — AB
MDC IDC STAT BRADY RV PERCENT PACED: 1 % — AB

## 2014-01-15 NOTE — Patient Instructions (Addendum)
Your physician recommends that you keep follow-up appointment in: August with Dr. Rayann Heman   Remote monitoring is used to monitor your Defibrillator from home. This monitoring reduces the number of office visits required to check your device to one time per year. It allows [place] to keep an eye on the functioning of your device to ensure it is working properly. You are scheduled for a device check from home on 04/17/14 . You may send your transmission at any time that day. If you have a wireless device, the transmission will be sent automatically. After your physician reviews your transmission, you will receive a postcard with your next transmission date.    Your physician recommends that you continue on your current medications as directed. Please refer to the Current Medication list given to you today.

## 2014-01-15 NOTE — Progress Notes (Signed)
Patient ID: THADE SLEDZ MRN: [id number], DOB/AGE: [birth year]   Date of Visit: 01/15/2014  Primary Physician: Lovette Cliche, MD Primary Cardiologist: Martinique, MD Primary EP: Rayann Heman, MD Reason for Visit: EP/device follow-up  History of Present Illness  [name] [name] is a 63 y.o. male with an ischemic CM s/p ICD implant 2007 (in [place]), gen change April [id number], chronic systolic HF, PAF, MVP s/p MV annuloplasty and CAD who presents today for routine electrophysiology followup. Since last being seen in our clinic, he reports he is doing well and has no complaints. He denies chest pain or shortness of breath. He denies palpitations, dizziness, near syncope or syncope. He denies LE swelling, orthopnea or PND. He is compliant and tolerating medications without difficulty.  Past Medical History Past Medical History  Diagnosis Date  . Hypertension   . Hyperlipidemia   . GERD (gastroesophageal reflux disease)   . MVP (mitral valve prolapse)     s/p MV annuloplasty at [facility] 2004  . Ischemic cardiomyopathy     s/p St. [name] (Atlas) ICD implanted in [place] 2007  . Anxiety   . CAD (coronary artery disease)     a.  s/p Stent to LAD '00;  b.  PTCA 2nd diagonal '10;  c. 02/18/12 Cath: moderate nonobs dzs - med rx  . Diabetes mellitus     type II uncontrolled  . PAF (paroxysmal atrial fibrillation)     noted on ICD interrogation '10  . Episodic mood disorder   . Chronic ischemic heart disease   . Depression   . ICD (implantable cardiac defibrillator) in place     Past Surgical History Past Surgical History  Procedure Laterality Date  . Cardiac defibrillator placement      implanted in [place], has a 7001 RV lead and a SJM Atlas ICD followed by Dr Caryl Comes  . Coronary angioplasty    . Mitral valve repair    . Coronary stent placement    . Cardiac valve replacement    . Implantable cardioverter defibrillator generator change  03/19/13    SJM Fortify ST DR generator placed by  Dr Lovena Le, part of Analyze ST study    Allergies/Intolerances Allergies  Allergen Reactions  . Lisinopril Cough    Current Home Medications Current Outpatient Prescriptions  Medication Sig Dispense Refill  . atorvastatin (LIPITOR) 20 MG tablet Take 20 mg by mouth daily.        . carvedilol (COREG) 25 MG tablet Take 25 mg by mouth 2 (two) times daily with a meal.        . esomeprazole (NEXIUM) 40 MG capsule Take 40 mg by mouth daily before breakfast.        . FLUoxetine (PROZAC) 40 MG capsule Take 40 mg by mouth daily.        . insulin glargine (LANTUS) 100 UNIT/ML injection Inject 20 Units into the skin daily.      . insulin glargine (LANTUS) 100 UNIT/ML injection Inject 45 Units into the skin at bedtime.      . isosorbide mononitrate (IMDUR) 30 MG 24 hr tablet Take 15 mg by mouth daily.       . metFORMIN (GLUCOPHAGE) 1000 MG tablet Take 1,000 mg by mouth 2 (two) times daily with a meal.      . niacin (NIASPAN) 500 MG CR tablet Take 500 mg by mouth at bedtime.        . nitroGLYCERIN (NITROSTAT) 0.4 MG SL tablet Place 1 tablet (0.4 mg total) under  the tongue every 5 (five) minutes x 3 doses as needed for chest pain.  25 tablet  3  . potassium chloride SA (K-DUR,KLOR-CON) 20 MEQ tablet Take 20 mEq by mouth daily.        . prasugrel (EFFIENT) 10 MG TABS Take 10 mg by mouth daily.        . ranolazine (RANEXA) 1000 MG SR tablet Take 1,000 mg by mouth 2 (two) times daily.        . traZODone (DESYREL) 100 MG tablet Take 100 mg by mouth at bedtime as needed for sleep.       . valsartan (DIOVAN) 160 MG tablet Take 80 mg by mouth daily.       . tamsulosin (FLOMAX) 0.4 MG CAPS capsule Take 1 capsule by mouth daily.       No current facility-administered medications for this visit.    Social History History   Social History  . Marital Status: Married    Spouse Name: N/A    Number of Children: 8  . Years of Education: N/A   Occupational History  . lawmaker     disabled   Social History  Main Topics  . Smoking status: Never Smoker   . Smokeless tobacco: Not on file  . Alcohol Use: No  . Drug Use: No  . Sexual Activity: Yes   Other Topics Concern  . Not on file   Social History Narrative  . No narrative on file     Review of Systems General: No chills, fever, night sweats or weight changes Cardiovascular: No chest pain, dyspnea on exertion, edema, orthopnea, palpitations, paroxysmal nocturnal dyspnea Dermatological: No rash, lesions or masses Respiratory: No cough, dyspnea Urologic: No hematuria, dysuria Abdominal: No nausea, vomiting, diarrhea, bright red blood per rectum, melena, or hematemesis Neurologic: No visual changes, weakness, changes in mental status All other systems reviewed and are otherwise negative except as noted above.  Physical Exam Vitals: Blood pressure 152/100, pulse 79, weight 232 lb (105.235 kg).  General: Well developed, well appearing 63 y.o. male in no acute distress. HEENT: Normocephalic, atraumatic. EOMs intact. Sclera nonicteric. Oropharynx clear.  Neck: Supple. No JVD. Lungs: Respirations regular and unlabored, CTA bilaterally. No wheezes, rales or rhonchi. Heart: RRR. S1, S2 present. II/VI systolic murmur. No rub, S3 or S4. Abdomen: Soft, non-tender, non-distended. BS present x 4 quadrants. No hepatosplenomegaly.  Extremities: No clubbing, cyanosis or edema. PT/Radials 2+ and equal bilaterally. Psych: Normal affect. Neuro: Alert and oriented X 3. Moves all extremities spontaneously. Skin: Left upper chest / implant site intact and well healed.   Diagnostics 12-lead ECG today - NSR at 79 bpm; PR 136, QRS 114, QT/QTc 416/477  Device interrogation performed today by industry for research - Normal device function. No VT/VF episodes. No mode switch episodes. Paced <1%. Stable lead measurements. Battery longevity 6.9 - 7.9 years. Histograms appropriate for level of activity. No programming changes made. See PaceArt report for full  details.  Assessment and Plan  1. Ischemic CM s/p ICD implant, EF now normalized by echo 2012 - normal device function - no VT/VF episodes - no programming changes made - see PaceArt report for full details - continue routine remote ICD f/u every 3 months - return for follow-up with Dr. Rayann Heman in 6 months   2. CAD - stable without anginal symptoms - continue medical therapy and routine follow-up with Dr. Martinique  Signed, Hershel Corkery, PA-C 01/15/2014, 11:10 AM

## 2014-04-17 ENCOUNTER — Ambulatory Visit (INDEPENDENT_AMBULATORY_CARE_PROVIDER_SITE_OTHER): Payer: Medicare Other | Admitting: *Deleted

## 2014-04-17 DIAGNOSIS — I428 Other cardiomyopathies: Secondary | ICD-10-CM

## 2014-04-21 LAB — MDC_IDC_ENUM_SESS_TYPE_REMOTE
HighPow Impedance: 54 Ohm
Implantable Pulse Generator Serial Number: 1056971
Lead Channel Impedance Value: 430 Ohm
Lead Channel Sensing Intrinsic Amplitude: 11.9 mV
Lead Channel Sensing Intrinsic Amplitude: 3 mV
Lead Channel Setting Pacing Pulse Width: 0.5 ms
MDC IDC MSMT LEADCHNL RV IMPEDANCE VALUE: 360 Ohm
MDC IDC SET LEADCHNL RA PACING AMPLITUDE: 2 V
MDC IDC SET LEADCHNL RV PACING AMPLITUDE: 2.5 V
MDC IDC SET LEADCHNL RV SENSING SENSITIVITY: 0.5 mV
MDC IDC STAT BRADY RA PERCENT PACED: 1 % — AB
MDC IDC STAT BRADY RV PERCENT PACED: 1 % — AB
Zone Setting Detection Interval: 310 ms

## 2014-04-26 NOTE — Progress Notes (Signed)
Remote ICD transmission.   

## 2014-05-02 ENCOUNTER — Encounter: Payer: Self-pay | Admitting: Cardiology

## 2014-05-30 ENCOUNTER — Encounter: Payer: Self-pay | Admitting: Internal Medicine

## 2014-08-07 ENCOUNTER — Ambulatory Visit (INDEPENDENT_AMBULATORY_CARE_PROVIDER_SITE_OTHER): Payer: Medicare PPO | Admitting: Internal Medicine

## 2014-08-07 ENCOUNTER — Encounter: Payer: Self-pay | Admitting: Internal Medicine

## 2014-08-07 VITALS — BP 158/110 | HR 73 | Ht 71.5 in | Wt 233.8 lb

## 2014-08-07 DIAGNOSIS — I428 Other cardiomyopathies: Secondary | ICD-10-CM

## 2014-08-07 DIAGNOSIS — I2510 Atherosclerotic heart disease of native coronary artery without angina pectoris: Secondary | ICD-10-CM

## 2014-08-07 DIAGNOSIS — I255 Ischemic cardiomyopathy: Secondary | ICD-10-CM

## 2014-08-07 DIAGNOSIS — I2589 Other forms of chronic ischemic heart disease: Secondary | ICD-10-CM

## 2014-08-07 DIAGNOSIS — I259 Chronic ischemic heart disease, unspecified: Secondary | ICD-10-CM

## 2014-08-07 LAB — MDC_IDC_ENUM_SESS_TYPE_INCLINIC
Battery Remaining Longevity: 88.8 mo
Brady Statistic RA Percent Paced: 0.08 %
Brady Statistic RV Percent Paced: 0.5 %
Date Time Interrogation Session: 20150826135525
HighPow Impedance: 48.2261
Lead Channel Impedance Value: 437.5 Ohm
Lead Channel Pacing Threshold Amplitude: 1.25 V
Lead Channel Pacing Threshold Pulse Width: 0.5 ms
Lead Channel Sensing Intrinsic Amplitude: 3.3 mV
Lead Channel Setting Pacing Amplitude: 2 V
Lead Channel Setting Sensing Sensitivity: 0.5 mV
MDC IDC MSMT LEADCHNL RA PACING THRESHOLD AMPLITUDE: 0.5 V
MDC IDC MSMT LEADCHNL RA PACING THRESHOLD PULSEWIDTH: 0.5 ms
MDC IDC MSMT LEADCHNL RV IMPEDANCE VALUE: 375 Ohm
MDC IDC MSMT LEADCHNL RV SENSING INTR AMPL: 11.9 mV
MDC IDC PG SERIAL: 1056971
MDC IDC SET LEADCHNL RV PACING AMPLITUDE: 2.5 V
MDC IDC SET LEADCHNL RV PACING PULSEWIDTH: 0.5 ms
Zone Setting Detection Interval: 310 ms

## 2014-08-07 MED ORDER — VALSARTAN 160 MG PO TABS: 160.0000 mg | ORAL_TABLET | Freq: Every day | ORAL | Status: DC

## 2014-08-07 NOTE — Progress Notes (Signed)
PCP: [facility] Angelique Blonder., MD Primary Cardiologist:  Dr Martinique  [name] [name] is a 63 y.o. male who presents today for routine electrophysiology followup.  He has had difficulty with elevated blood pressure recently.  He has been having vertiginous spinning for which his diovan was decreased.  His dizziness did not improve.  Today, he denies symptoms of palpitations, chest pain, shortness of breath,  lower extremity edema, presyncope, syncope, or ICD shocks.  The patient is otherwise without complaint today.   Past Medical History  Diagnosis Date  . Hypertension   . Hyperlipidemia   . GERD (gastroesophageal reflux disease)   . MVP (mitral valve prolapse)     s/p MV annuloplasty at [facility] 2004  . Ischemic cardiomyopathy     s/p St. [name] (Atlas) ICD implanted in [place] 2007  . Anxiety   . CAD (coronary artery disease)     a.  s/p Stent to LAD '00;  b.  PTCA 2nd diagonal '10;  c. 02/18/12 Cath: moderate nonobs dzs - med rx  . Diabetes mellitus     type II uncontrolled  . PAF (paroxysmal atrial fibrillation)     noted on ICD interrogation '10  . Episodic mood disorder   . Chronic ischemic heart disease   . Depression   . ICD (implantable cardiac defibrillator) in place    Past Surgical History  Procedure Laterality Date  . Cardiac defibrillator placement      implanted in [place], has a 7001 RV lead and a SJM Atlas ICD followed by Dr Caryl Comes  . Coronary angioplasty    . Mitral valve repair    . Coronary stent placement    . Cardiac valve replacement    . Implantable cardioverter defibrillator generator change  03/19/13    SJM Fortify ST DR generator placed by Dr Lovena Le, part of Analyze ST study    Current Outpatient Prescriptions  Medication Sig Dispense Refill  . atorvastatin (LIPITOR) 20 MG tablet Take 20 mg by mouth daily.        . carvedilol (COREG) 25 MG tablet Take 25 mg by mouth 2 (two) times daily with a meal.        . doxycycline (VIBRA-TABS) 100 MG tablet  Take 1 tablet by mouth 2 (two) times daily.      Marland Kitchen esomeprazole (NEXIUM) 40 MG capsule Take 40 mg by mouth daily before breakfast.        . isosorbide mononitrate (IMDUR) 30 MG 24 hr tablet Take 15 mg by mouth daily.       Marland Kitchen LEVEMIR 100 UNIT/ML injection 48 UNITS JUST BEFORE BED      . niacin (NIASPAN) 500 MG CR tablet Take 500 mg by mouth at bedtime.        . nitroGLYCERIN (NITROSTAT) 0.4 MG SL tablet Place 1 tablet (0.4 mg total) under the tongue every 5 (five) minutes x 3 doses as needed for chest pain.  25 tablet  3  . potassium chloride SA (K-DUR,KLOR-CON) 20 MEQ tablet Take 20 mEq by mouth daily.        . prasugrel (EFFIENT) 10 MG TABS Take 10 mg by mouth daily.        . ranolazine (RANEXA) 1000 MG SR tablet Take 1,000 mg by mouth 2 (two) times daily.        . tamsulosin (FLOMAX) 0.4 MG CAPS capsule Take 1 capsule by mouth daily.      . valsartan (DIOVAN) 160 MG tablet Take 1 tablet (160  mg total) by mouth daily.  90 tablet  3   No current facility-administered medications for this visit.    Physical Exam: Filed Vitals:   08/07/14 1339  BP: 158/110  Pulse: 73  Height: 5' 11.5" (1.816 m)  Weight: 233 lb 12.8 oz (106.051 kg)    GEN- The patient is well appearing, alert and oriented x 3 today.   Head- normocephalic, atraumatic Eyes-  Sclera clear, conjunctiva pink Ears- hearing intact Oropharynx- clear Lungs- Clear to ausculation bilaterally, normal work of breathing Chest- ICD pocket is well healed Heart- Regular rate and rhythm, no murmurs, rubs or gallops, PMI not laterally displaced GI- soft, NT, ND, + BS Extremities- no clubbing, cyanosis, or edema  ICD interrogation- reviewed in detail today,  See PACEART report  Assessment and Plan:  1.  Chronic systolic dysfunction euvolemic today He has not had an echo is quite some time Normal ICD function See Pace Art report No changes today  2. Hypertension Elevated Increase diovan back to 160mg  daily today  3.  Dizziness Unlikely cardiac No arrhythmias by ICD interrogation to correlate BP is elevated Will treat hypertension Follow-up with primary care is encouraged   He has not seen Dr Martinique in over a year.  I have encouraged routine follow-up with Dr Patsy Lager I will see again in 1year Merlin Return to see Research team 2/16

## 2014-08-07 NOTE — Patient Instructions (Signed)
Your physician recommends that you schedule a follow-up appointment next available with Dr Martinique   Your physician wants you to follow-up in: 6 months with device clinic You will receive a reminder letter in the mail two months in advance. If you don't receive a letter, please call our office to schedule the follow-up appointment.  Remote monitoring is used to monitor your Pacemaker of ICD from home. This monitoring reduces the number of office visits required to check your device to one time per year. It allows [place] to keep an eye on the functioning of your device to ensure it is working properly. You are scheduled for a device check from home on 11/11/14. You may send your transmission at any time that day. If you have a wireless device, the transmission will be sent automatically. After your physician reviews your transmission, you will receive a postcard with your next transmission date.  Your physician has recommended you make the following change in your medication:  1) Increase Valsartan 160mg 

## 2014-08-20 ENCOUNTER — Encounter: Payer: Self-pay | Admitting: Internal Medicine

## 2014-08-21 ENCOUNTER — Encounter: Payer: Medicare Other | Admitting: Internal Medicine

## 2014-10-10 ENCOUNTER — Ambulatory Visit: Payer: Medicare PPO | Admitting: Cardiology

## 2014-11-11 ENCOUNTER — Ambulatory Visit (INDEPENDENT_AMBULATORY_CARE_PROVIDER_SITE_OTHER): Payer: Medicare PPO | Admitting: *Deleted

## 2014-11-11 DIAGNOSIS — I255 Ischemic cardiomyopathy: Secondary | ICD-10-CM

## 2014-11-12 NOTE — Progress Notes (Signed)
Remote ICD transmission.   

## 2014-11-21 ENCOUNTER — Encounter (HOSPITAL_COMMUNITY): Payer: Self-pay | Admitting: Internal Medicine

## 2014-12-20 ENCOUNTER — Ambulatory Visit (INDEPENDENT_AMBULATORY_CARE_PROVIDER_SITE_OTHER): Payer: Commercial Managed Care - HMO | Admitting: Cardiology

## 2014-12-20 ENCOUNTER — Encounter: Payer: Self-pay | Admitting: Cardiology

## 2014-12-20 VITALS — BP 110/72 | HR 66 | Ht 72.0 in | Wt 228.9 lb

## 2014-12-20 DIAGNOSIS — I255 Ischemic cardiomyopathy: Secondary | ICD-10-CM

## 2014-12-20 DIAGNOSIS — I259 Chronic ischemic heart disease, unspecified: Secondary | ICD-10-CM | POA: Diagnosis not present

## 2014-12-20 DIAGNOSIS — I2510 Atherosclerotic heart disease of native coronary artery without angina pectoris: Secondary | ICD-10-CM

## 2014-12-20 DIAGNOSIS — I519 Heart disease, unspecified: Secondary | ICD-10-CM

## 2014-12-20 DIAGNOSIS — Z95810 Presence of automatic (implantable) cardiac defibrillator: Secondary | ICD-10-CM

## 2014-12-20 NOTE — Progress Notes (Signed)
[name] [name] Date of Birth: 10/28/1951   History of Present Illness: [name] [name] of his coronary disease. He was last seen by me in October 2013. He has a history of CAD. He underwent stenting of the proximal LAD and diagonal branch in 2000. Cardiac caths in 2005 [name]) and 2013 ([name]) showed no obstructive disease. He is s/p MV repair at [name] in 2004. He has a history of chronic systolic CHF and has an ICD in place. He is followed in the device clinic. His last Echo in 2012 showed an EF 50-55%.   Today he reports SOB at times. It tends to awaken him at night. He has some chest tightness with this. He denies edema or palpitations. Stays fairly active. Labs are followed by primary care.    Current Outpatient Prescriptions on File Prior to Visit  Medication Sig Dispense Refill  . atorvastatin (LIPITOR) 20 MG tablet Take 20 mg by mouth daily.      . carvedilol (COREG) 25 MG tablet Take 25 mg by mouth 2 (two) times daily with a meal.      . doxycycline (VIBRA-TABS) 100 MG tablet Take 1 tablet by mouth 2 (two) times daily.    Marland Kitchen esomeprazole (NEXIUM) 40 MG capsule Take 40 mg by mouth daily before breakfast.      . isosorbide mononitrate (IMDUR) 30 MG 24 hr tablet Take 15 mg by mouth daily.     Marland Kitchen LEVEMIR 100 UNIT/ML injection 48 UNITS JUST BEFORE BED    . niacin (NIASPAN) 500 MG CR tablet Take 500 mg by mouth at bedtime.      . potassium chloride SA (K-DUR,KLOR-CON) 20 MEQ tablet Take 20 mEq by mouth daily.      . prasugrel (EFFIENT) 10 MG TABS Take 10 mg by mouth daily.      . ranolazine (RANEXA) 1000 MG SR tablet Take 1,000 mg by mouth 2 (two) times daily.      . tamsulosin (FLOMAX) 0.4 MG CAPS capsule Take 1 capsule by mouth daily.    . valsartan (DIOVAN) 160 MG tablet Take 1 tablet (160 mg total) by mouth daily. 90 tablet 3  . nitroGLYCERIN (NITROSTAT) 0.4 MG SL tablet Place 1 tablet (0.4 mg total) under the tongue every 5 (five)  minutes x 3 doses as needed for chest pain. 25 tablet 3   No current facility-administered medications on file prior to visit.    Allergies  Allergen Reactions  . Metformin And Related Nausea Only and Other (See Comments)    Loss of appetite  . Lisinopril Cough    Past Medical History  Diagnosis Date  . Hypertension   . Hyperlipidemia   . GERD (gastroesophageal reflux disease)   . MVP (mitral valve prolapse)     s/p MV annuloplasty at [name] 2004  . Ischemic cardiomyopathy     s/p [name] ([name]) ICD implanted in [name] 2007  . Anxiety   . CAD (coronary artery disease)     a.  s/p Stent to LAD '00;  b.  PTCA 2nd diagonal '10;  c. 02/18/12 Cath: moderate nonobs dzs - med rx  . Diabetes mellitus     type II uncontrolled  . PAF (paroxysmal atrial fibrillation)     noted on ICD interrogation '10  . Episodic mood disorder   . Chronic ischemic heart disease   . Depression   . ICD (implantable cardiac defibrillator) in place  Past Surgical History  Procedure Laterality Date  . Cardiac defibrillator placement      implanted in [name], has a 7001 RV lead and a SJM [name] ICD followed by [name]  . Coronary angioplasty    . Mitral valve repair    . Coronary stent placement    . Cardiac valve replacement    . Implantable cardioverter defibrillator generator change  03/19/13    SJM Fortify [name] [name] generator placed by [name]  . Left heart catheterization with coronary angiogram N/A 02/17/2012    Procedure: LEFT HEART CATHETERIZATION WITH CORONARY ANGIOGRAM;  Surgeon: [name] [name];  Location: [name];  Service: Cardiovascular;  Laterality: N/A;  . Implantable cardioverter defibrillator (icd) generator change N/A 03/19/2013    Procedure: ICD GENERATOR CHANGE;  Surgeon: [name] [name];  [name];  Service: Cardiovascular;  Laterality: N/A;    History  Smoking status  . Never Smoker   Smokeless tobacco  . Not on  file    History  Alcohol Use No    Family History  Problem Relation Age of Onset  . Coronary artery disease Mother   . Colon cancer Mother   . Heart attack Mother   . Heart disease Mother   . Heart failure Mother   . Hypertension Mother   . Malignant hyperthermia Mother   . Colon cancer Sister   . Coronary artery disease Brother   . Heart disease Brother     Review of Systems: As noted in history of present illness. All other systems are reviewed and are negative.  Physical Exam: BP 110/72 mmHg  Pulse 66  Ht 6' (1.829 m)  Wt 228 lb 14.4 oz (103.828 kg)  BMI 31.04 kg/m2 He is a pleasant overweight white male in no acute distress. He is normocephalic, atraumatic. Pupils are equal round and reactive to light and accommodation. Extraocular movements are full. Oropharynx is clear. Neck is supple without JVD, adenopathy, thyromegaly, or bruits. Lungs are clear. Cardiac exam reveals a regular rate and rhythm with a grade [id number] holosystolic murmur heard best at the apex radiating to the left axilla. There is no S3. His defibrillator site in the left subclavicular area appears normal. He has a healed median sternotomy scar.  Femoral and pedal pulses are 2+ and symmetric. He has no significant edema. Skin is warm and dry. He is alert and oriented x3. Cranial nerves II through XII are intact.  LABORATORY DATA: ICD check in Nov. 2015  was satisfactory.  Assessment / Plan: 1. Coronary disease status post stenting of the LAD and diagonal. Cardiac catheterization in March of 2013 showed continued patency of the stent with moderate nonobstructive disease. Will update stress Myoview at this time. Continue current medication.  2. History of congestive heart failure with chronic systolic dysfunction. Ejection fraction had improved to 50-55% by echo in 2012. He is status post ICD implant. Will update Echo.  3. History of paroxysmal atrial fibrillation by ICD check in 2010. No evidence of  recurrence.  4. Hypertension. Blood pressure is well controlled.   6. Diabetes mellitus on insulin.  7. History of mitral valve prolapse status post mitral annuloplasty in 2004. Follow up Echo.

## 2014-12-20 NOTE — Patient Instructions (Signed)
Continue your current therapy  We will schedule you for an Echocardiogram and nuclear stress test.

## 2015-01-02 ENCOUNTER — Telehealth (HOSPITAL_COMMUNITY): Payer: Self-pay

## 2015-01-02 NOTE — Telephone Encounter (Signed)
Encounter complete. 

## 2015-01-08 ENCOUNTER — Other Ambulatory Visit (HOSPITAL_COMMUNITY): Payer: Self-pay | Admitting: Cardiology

## 2015-01-08 ENCOUNTER — Ambulatory Visit (HOSPITAL_COMMUNITY)
Admission: RE | Admit: 2015-01-08 | Discharge: 2015-01-08 | Disposition: A | Discharge status: Home / Self Care | Payer: Medicare PPO | Source: Ambulatory Visit | Attending: Cardiovascular Disease | Admitting: Cardiovascular Disease

## 2015-01-08 ENCOUNTER — Ambulatory Visit (HOSPITAL_BASED_OUTPATIENT_CLINIC_OR_DEPARTMENT_OTHER)
Admission: RE | Admit: 2015-01-08 | Discharge: 2015-01-08 | Disposition: A | Discharge status: Home / Self Care | Payer: Medicare PPO | Source: Ambulatory Visit | Attending: Cardiovascular Disease | Admitting: Cardiovascular Disease

## 2015-01-08 DIAGNOSIS — I259 Chronic ischemic heart disease, unspecified: Secondary | ICD-10-CM | POA: Diagnosis not present

## 2015-01-08 DIAGNOSIS — E119 Type 2 diabetes mellitus without complications: Secondary | ICD-10-CM | POA: Insufficient documentation

## 2015-01-08 DIAGNOSIS — Z8249 Family history of ischemic heart disease and other diseases of the circulatory system: Secondary | ICD-10-CM | POA: Insufficient documentation

## 2015-01-08 DIAGNOSIS — Z95810 Presence of automatic (implantable) cardiac defibrillator: Secondary | ICD-10-CM

## 2015-01-08 DIAGNOSIS — I2510 Atherosclerotic heart disease of native coronary artery without angina pectoris: Secondary | ICD-10-CM

## 2015-01-08 DIAGNOSIS — I10 Essential (primary) hypertension: Secondary | ICD-10-CM | POA: Diagnosis not present

## 2015-01-08 DIAGNOSIS — I519 Heart disease, unspecified: Secondary | ICD-10-CM

## 2015-01-08 DIAGNOSIS — E785 Hyperlipidemia, unspecified: Secondary | ICD-10-CM | POA: Insufficient documentation

## 2015-01-08 DIAGNOSIS — I255 Ischemic cardiomyopathy: Secondary | ICD-10-CM

## 2015-01-08 MED ORDER — TECHNETIUM TC 99M TETROFOSMIN IV KIT
10.4000 | PACK | Freq: Once | INTRAVENOUS | Status: AC | PRN
  Administered 2015-01-08: 10 via INTRAVENOUS

## 2015-01-08 NOTE — Progress Notes (Signed)
2D Echocardiogram Complete.  01/08/2015   [name] [name], RDCS

## 2015-01-10 ENCOUNTER — Ambulatory Visit (HOSPITAL_COMMUNITY)
Admission: RE | Admit: 2015-01-10 | Discharge: 2015-01-10 | Disposition: A | Discharge status: Home / Self Care | Payer: Commercial Managed Care - HMO | Source: Ambulatory Visit | Attending: Cardiology | Admitting: Cardiology

## 2015-01-10 DIAGNOSIS — E669 Obesity, unspecified: Secondary | ICD-10-CM | POA: Diagnosis not present

## 2015-01-10 DIAGNOSIS — E109 Type 1 diabetes mellitus without complications: Secondary | ICD-10-CM | POA: Diagnosis not present

## 2015-01-10 DIAGNOSIS — I10 Essential (primary) hypertension: Secondary | ICD-10-CM | POA: Diagnosis not present

## 2015-01-10 DIAGNOSIS — R079 Chest pain, unspecified: Secondary | ICD-10-CM | POA: Diagnosis not present

## 2015-01-10 DIAGNOSIS — E785 Hyperlipidemia, unspecified: Secondary | ICD-10-CM | POA: Diagnosis not present

## 2015-01-10 DIAGNOSIS — R5383 Other fatigue: Secondary | ICD-10-CM | POA: Insufficient documentation

## 2015-01-10 DIAGNOSIS — Z794 Long term (current) use of insulin: Secondary | ICD-10-CM | POA: Diagnosis not present

## 2015-01-10 DIAGNOSIS — R0609 Other forms of dyspnea: Secondary | ICD-10-CM | POA: Insufficient documentation

## 2015-01-10 DIAGNOSIS — R55 Syncope and collapse: Secondary | ICD-10-CM | POA: Insufficient documentation

## 2015-01-10 DIAGNOSIS — I2510 Atherosclerotic heart disease of native coronary artery without angina pectoris: Secondary | ICD-10-CM | POA: Insufficient documentation

## 2015-01-10 DIAGNOSIS — R42 Dizziness and giddiness: Secondary | ICD-10-CM | POA: Insufficient documentation

## 2015-01-10 DIAGNOSIS — I259 Chronic ischemic heart disease, unspecified: Secondary | ICD-10-CM | POA: Diagnosis not present

## 2015-01-10 MED ORDER — AMINOPHYLLINE 25 MG/ML IV SOLN
150.0000 mg | Freq: Once | INTRAVENOUS | Status: AC
  Administered 2015-01-10: 150 mg via INTRAVENOUS

## 2015-01-10 MED ORDER — REGADENOSON 0.4 MG/5ML IV SOLN
0.4000 mg | Freq: Once | INTRAVENOUS | Status: AC
  Administered 2015-01-10: 0.4 mg via INTRAVENOUS

## 2015-01-10 MED ORDER — TECHNETIUM TC 99M SESTAMIBI GENERIC - CARDIOLITE
31.0000 | Freq: Once | INTRAVENOUS | Status: AC | PRN
  Administered 2015-01-10: 31 via INTRAVENOUS

## 2015-01-10 NOTE — Op Note (Signed)
Pt came to office to have NUC MPI completed. Pt walked from waiting room to dressing room. Pt became weak and ashen. Pt also c/o dizziness and lightheadedness. Pt assisted to chair and given brief assessment. Pt had Blood glucose of 330 mg/dl; Pt had heart rate of 62 with oxygen sat of 95%;Pt BP was 72 systolic by manual cuff. I repeated BP and again got a result of 72 systolic without being able to get a diastolic reading. Pt was assisted to testing room and placed in recliner in trendelenburg position. MD was called to pt side. IV started and 1058ml bag of fluids were hung per MD Martinique.

## 2015-01-10 NOTE — Procedures (Addendum)
[place] [address] [place] [id number]  Cardiology Nuclear Med Study  [name] [name] is a 64 y.o. male     MRN : [id number]     DOB: 1951/05/10  Procedure Date: 01/10/2015  Nuclear Med Background Indication for Stress Test:  Evaluation for Ischemia and Follow up CAD;CP and SOB/DOE History:  MVP and CAD;STENT/PTCA;AICD;AFIB;ICM;Mitral valve insufficiency Cardiac Risk Factors: Hypertension, IDDM Type 2, Lipids and Obesity  Symptoms:  Chest Pain, Dizziness, DOE, Fatigue, Light-Headedness, Near Syncope and SOB   Nuclear Pre-Procedure Caffeine/Decaff Intake:  8:00pm NPO After: 6:00am   IV Site: R Antecubital  IV 0.9% NS with Angio Cath:  22g  Chest Size (in):  48" IV Started by: Rolene Course, RN  Height: 6' (1.829 m)  Cup Size: n/a  BMI:  Body mass index is 30.92 kg/(m^2). Weight:  228 lb (103.42 kg)   Tech Comments:Patient was very hypotensive at arrival with elevated glucose level, Dr. Martinique ok'd change [name]. Fluids were started with improvement.  tmm    Nuclear Med Study 1 or 2 day study: 2 Day Stress Test Type:  [place]  Order Authorizing Provider:  Peter Martinique, MD   Resting Radionuclide: Technetium 56m Sestamibi  Resting Radionuclide Dose: 10.4 mCi   Stress Radionuclide:  Technetium 25m Sestamibi  Stress Radionuclide Dose: 31.0 mCi           Stress Protocol Rest HR: 56 Stress HR: 63  Rest BP: 135/103 Stress BP: 130/92  Exercise Time (min): n/a METS: n/a   Predicted Max HR: 155 bpm % Max HR: 40.65 bpm Rate Pressure Product: 8505  Dose of Adenosine (mg):  n/a Dose of Lexiscan: 0.4 mg  Dose of Atropine (mg): n/a Dose of Dobutamine: n/a mcg/kg/min (at max HR)  Stress Test Technologist: Leane Para, CCT Nuclear Technologist: Imagene Riches, CNMT   Rest Procedure:  Myocardial perfusion imaging was performed at rest 45 minutes following the intravenous administration of Technetium  5m Sestamibi. Stress Procedure:  The patient received IV Lexiscan 0.4 mg over 15-seconds.  Technetium 72m Sestamibi injected IV at 30-seconds.  Patient experienced SOB, Nausea and Lightheadedness and 150 mg Aminophylline IV was administered.  There were no significant changes with Lexiscan.  Quantitative spect images were obtained after a 45 minute delay.  Transient Ischemic Dilatation (Normal <1.22):  0.90  QGS EDV:  205 ml QGS ESV:  136 ml LV Ejection Fraction: 33%  Rest ECG: NSR with non-specific ST-T wave changes  Stress ECG: No significant change from baseline ECG  QPS Raw Data Images:  There is interference from nuclear activity from structures below the diaphragm. This does not affect the ability to read the study. Stress Images:  There is decreased uptake in the inferior wall. Rest Images:  There is decreased uptake in the inferior wall. Subtraction (SDS):  6  Impression Exercise Capacity:  Lexiscan with no exercise. BP Response:  Normal blood pressure response. Clinical Symptoms:  No significant symptoms noted. ECG Impression:  No significant ECG changes with Lexiscan. Comparison with Prior Nuclear Study: No previous nuclear study performed  Overall Impression:  High risk stress nuclear study with moderate-sized and intensity, reversible inferior perfusion defect - there is adjacent bowel which could lead to some attenuation, however, there is severe inferior hypokinesis.  LV Wall Motion:  LVEF 33%, global hypokinesis with severe inferior hypokinesis to akinesis  [name], MD, [place] Certified in Nuclear Cardiology Attending Cardiologist [place]  C, MD  01/10/2015 12:39 PM

## 2015-01-13 ENCOUNTER — Other Ambulatory Visit: Payer: Self-pay

## 2015-01-13 ENCOUNTER — Ambulatory Visit
Admission: RE | Admit: 2015-01-13 | Discharge: 2015-01-13 | Disposition: A | Discharge status: Home / Self Care | Payer: Commercial Managed Care - HMO | Source: Ambulatory Visit | Attending: Cardiology | Admitting: Cardiology

## 2015-01-13 ENCOUNTER — Other Ambulatory Visit: Payer: Self-pay | Admitting: Cardiology

## 2015-01-13 DIAGNOSIS — R0602 Shortness of breath: Secondary | ICD-10-CM | POA: Diagnosis not present

## 2015-01-13 DIAGNOSIS — R9439 Abnormal result of other cardiovascular function study: Secondary | ICD-10-CM

## 2015-01-13 DIAGNOSIS — Z01810 Encounter for preprocedural cardiovascular examination: Secondary | ICD-10-CM | POA: Diagnosis not present

## 2015-01-13 DIAGNOSIS — I2510 Atherosclerotic heart disease of native coronary artery without angina pectoris: Secondary | ICD-10-CM | POA: Diagnosis not present

## 2015-01-13 DIAGNOSIS — R931 Abnormal findings on diagnostic imaging of heart and coronary circulation: Secondary | ICD-10-CM | POA: Diagnosis not present

## 2015-01-13 DIAGNOSIS — I517 Cardiomegaly: Secondary | ICD-10-CM | POA: Diagnosis not present

## 2015-01-13 LAB — CBC WITH DIFFERENTIAL/PLATELET
BASOS PCT: 1 % (ref 0–1)
Basophils Absolute: 0.1 10*3/uL (ref 0.0–0.1)
EOS ABS: 0.2 10*3/uL (ref 0.0–0.7)
EOS PCT: 3 % (ref 0–5)
HCT: 45.5 % (ref 39.0–52.0)
Hemoglobin: 15.4 g/dL (ref 13.0–17.0)
LYMPHS PCT: 27 % (ref 12–46)
Lymphs Abs: 1.9 10*3/uL (ref 0.7–4.0)
MCH: 30.3 pg (ref 26.0–34.0)
MCHC: 33.8 g/dL (ref 30.0–36.0)
MCV: 89.6 fL (ref 78.0–100.0)
MPV: 9.7 fL (ref 8.6–12.4)
Monocytes Absolute: 0.5 10*3/uL (ref 0.1–1.0)
Monocytes Relative: 7 % (ref 3–12)
NEUTROS ABS: 4.3 10*3/uL (ref 1.7–7.7)
Neutrophils Relative %: 62 % (ref 43–77)
Platelets: 249 10*3/uL (ref 150–400)
RBC: 5.08 MIL/uL (ref 4.22–5.81)
RDW: 13.3 % (ref 11.5–15.5)
WBC: 7 10*3/uL (ref 4.0–10.5)

## 2015-01-13 LAB — BASIC METABOLIC PANEL
BUN: 23 mg/dL (ref 6–23)
CO2: 25 mEq/L (ref 19–32)
CREATININE: 1.14 mg/dL (ref 0.50–1.35)
Calcium: 9.4 mg/dL (ref 8.4–10.5)
Chloride: 99 mEq/L (ref 96–112)
Glucose, Bld: 325 mg/dL — ABNORMAL HIGH (ref 70–99)
POTASSIUM: 4.5 meq/L (ref 3.5–5.3)
Sodium: 134 mEq/L — ABNORMAL LOW (ref 135–145)

## 2015-01-13 LAB — PROTIME-INR
INR: 1.05 (ref <1.50)
PROTHROMBIN TIME: 13.7 s (ref 11.6–15.2)

## 2015-01-14 ENCOUNTER — Encounter (HOSPITAL_COMMUNITY): Payer: Self-pay | Admitting: General Practice

## 2015-01-14 ENCOUNTER — Encounter (HOSPITAL_COMMUNITY)
Admission: RE | Disposition: A | Discharge status: Home / Self Care | Payer: Commercial Managed Care - HMO | Source: Ambulatory Visit | Attending: Cardiology

## 2015-01-14 ENCOUNTER — Ambulatory Visit (HOSPITAL_COMMUNITY)
Admission: RE | Admit: 2015-01-14 | Discharge: 2015-01-15 | Disposition: A | Discharge status: Home / Self Care | Payer: Commercial Managed Care - HMO | Source: Ambulatory Visit | Attending: Cardiology | Admitting: Cardiology

## 2015-01-14 DIAGNOSIS — R9439 Abnormal result of other cardiovascular function study: Secondary | ICD-10-CM

## 2015-01-14 DIAGNOSIS — I480 Paroxysmal atrial fibrillation: Secondary | ICD-10-CM | POA: Insufficient documentation

## 2015-01-14 DIAGNOSIS — F329 Major depressive disorder, single episode, unspecified: Secondary | ICD-10-CM | POA: Insufficient documentation

## 2015-01-14 DIAGNOSIS — I519 Heart disease, unspecified: Secondary | ICD-10-CM | POA: Diagnosis present

## 2015-01-14 DIAGNOSIS — Z6830 Body mass index (BMI) 30.0-30.9, adult: Secondary | ICD-10-CM | POA: Insufficient documentation

## 2015-01-14 DIAGNOSIS — F419 Anxiety disorder, unspecified: Secondary | ICD-10-CM | POA: Insufficient documentation

## 2015-01-14 DIAGNOSIS — E669 Obesity, unspecified: Secondary | ICD-10-CM | POA: Diagnosis not present

## 2015-01-14 DIAGNOSIS — K219 Gastro-esophageal reflux disease without esophagitis: Secondary | ICD-10-CM | POA: Insufficient documentation

## 2015-01-14 DIAGNOSIS — E785 Hyperlipidemia, unspecified: Secondary | ICD-10-CM | POA: Diagnosis not present

## 2015-01-14 DIAGNOSIS — Z794 Long term (current) use of insulin: Secondary | ICD-10-CM | POA: Insufficient documentation

## 2015-01-14 DIAGNOSIS — Z95810 Presence of automatic (implantable) cardiac defibrillator: Secondary | ICD-10-CM | POA: Diagnosis not present

## 2015-01-14 DIAGNOSIS — I2510 Atherosclerotic heart disease of native coronary artery without angina pectoris: Secondary | ICD-10-CM | POA: Diagnosis not present

## 2015-01-14 DIAGNOSIS — I340 Nonrheumatic mitral (valve) insufficiency: Secondary | ICD-10-CM | POA: Diagnosis not present

## 2015-01-14 DIAGNOSIS — I255 Ischemic cardiomyopathy: Secondary | ICD-10-CM | POA: Diagnosis not present

## 2015-01-14 DIAGNOSIS — Z955 Presence of coronary angioplasty implant and graft: Secondary | ICD-10-CM | POA: Diagnosis not present

## 2015-01-14 DIAGNOSIS — Z79899 Other long term (current) drug therapy: Secondary | ICD-10-CM | POA: Insufficient documentation

## 2015-01-14 DIAGNOSIS — I5022 Chronic systolic (congestive) heart failure: Secondary | ICD-10-CM | POA: Insufficient documentation

## 2015-01-14 DIAGNOSIS — E1165 Type 2 diabetes mellitus with hyperglycemia: Secondary | ICD-10-CM | POA: Insufficient documentation

## 2015-01-14 DIAGNOSIS — I10 Essential (primary) hypertension: Secondary | ICD-10-CM | POA: Insufficient documentation

## 2015-01-14 HISTORY — DX: Abnormal result of other cardiovascular function study: R94.39

## 2015-01-14 HISTORY — DX: Type 2 diabetes mellitus without complications: E11.9

## 2015-01-14 HISTORY — DX: Male erectile dysfunction, unspecified: N52.9

## 2015-01-14 HISTORY — PX: LEFT AND RIGHT HEART CATHETERIZATION WITH CORONARY ANGIOGRAM: SHX5449

## 2015-01-14 LAB — GLUCOSE, CAPILLARY
GLUCOSE-CAPILLARY: 241 mg/dL — AB (ref 70–99)
GLUCOSE-CAPILLARY: 255 mg/dL — AB (ref 70–99)
Glucose-Capillary: 350 mg/dL — ABNORMAL HIGH (ref 70–99)
Glucose-Capillary: 382 mg/dL — ABNORMAL HIGH (ref 70–99)
Glucose-Capillary: 402 mg/dL — ABNORMAL HIGH (ref 70–99)

## 2015-01-14 SURGERY — LEFT AND RIGHT HEART CATHETERIZATION WITH CORONARY ANGIOGRAM

## 2015-01-14 MED ORDER — HEPARIN (PORCINE) IN NACL 2-0.9 UNIT/ML-% IJ SOLN
INTRAMUSCULAR | Status: AC
  Filled 2015-01-14: qty 1500

## 2015-01-14 MED ORDER — INSULIN ASPART 100 UNIT/ML ~~LOC~~ SOLN
0.0000 [IU] | Freq: Three times a day (TID) | SUBCUTANEOUS | Status: DC
  Administered 2015-01-14: 5 [IU] via SUBCUTANEOUS
  Administered 2015-01-14: 3 [IU] via SUBCUTANEOUS
  Administered 2015-01-14: 9 [IU] via SUBCUTANEOUS
  Administered 2015-01-15: 08:00:00 7 [IU] via SUBCUTANEOUS
  Filled 2015-01-14: qty 0.09

## 2015-01-14 MED ORDER — ATORVASTATIN CALCIUM 20 MG PO TABS
20.0000 mg | ORAL_TABLET | Freq: Every day | ORAL | Status: DC
  Administered 2015-01-14: 18:00:00 20 mg via ORAL
  Filled 2015-01-14 (×2): qty 1

## 2015-01-14 MED ORDER — NITROGLYCERIN 1 MG/10 ML FOR IR/CATH LAB
INTRA_ARTERIAL | Status: AC
  Filled 2015-01-14: qty 10

## 2015-01-14 MED ORDER — BIVALIRUDIN 250 MG IV SOLR
INTRAVENOUS | Status: AC
  Filled 2015-01-14: qty 250

## 2015-01-14 MED ORDER — HEPARIN SODIUM (PORCINE) 1000 UNIT/ML IJ SOLN
INTRAMUSCULAR | Status: AC
  Filled 2015-01-14: qty 1

## 2015-01-14 MED ORDER — INSULIN DETEMIR 100 UNIT/ML ~~LOC~~ SOLN
50.0000 [IU] | Freq: Every day | SUBCUTANEOUS | Status: DC
Start: 2015-01-14 — End: 2015-01-15
  Administered 2015-01-14: 23:00:00 50 [IU] via SUBCUTANEOUS
  Filled 2015-01-14 (×3): qty 0.5

## 2015-01-14 MED ORDER — FLUOXETINE HCL 20 MG PO CAPS
40.0000 mg | ORAL_CAPSULE | Freq: Every day | ORAL | Status: DC
  Administered 2015-01-14 – 2015-01-15 (×2): 40 mg via ORAL
  Filled 2015-01-14 (×2): qty 2

## 2015-01-14 MED ORDER — ASPIRIN EC 81 MG PO TBEC: 81.0000 mg | DELAYED_RELEASE_TABLET | Freq: Every day | ORAL | Status: DC

## 2015-01-14 MED ORDER — VERAPAMIL HCL 2.5 MG/ML IV SOLN
INTRAVENOUS | Status: AC
  Filled 2015-01-14: qty 2

## 2015-01-14 MED ORDER — LIDOCAINE HCL (PF) 1 % IJ SOLN
INTRAMUSCULAR | Status: AC
  Filled 2015-01-14: qty 30

## 2015-01-14 MED ORDER — ASPIRIN 81 MG PO CHEW: 81.0000 mg | CHEWABLE_TABLET | ORAL | Status: DC

## 2015-01-14 MED ORDER — IRBESARTAN 150 MG PO TABS
150.0000 mg | ORAL_TABLET | Freq: Every day | ORAL | Status: DC
  Administered 2015-01-14 – 2015-01-15 (×2): 150 mg via ORAL
  Filled 2015-01-14 (×2): qty 1

## 2015-01-14 MED ORDER — RANOLAZINE ER 500 MG PO TB12
1000.0000 mg | ORAL_TABLET | Freq: Two times a day (BID) | ORAL | Status: DC
  Administered 2015-01-14: 21:00:00 1000 mg via ORAL
  Filled 2015-01-14 (×3): qty 2

## 2015-01-14 MED ORDER — PRASUGREL HCL 10 MG PO TABS
ORAL_TABLET | ORAL | Status: AC
  Filled 2015-01-14: qty 6

## 2015-01-14 MED ORDER — MIDAZOLAM HCL 2 MG/2ML IJ SOLN
INTRAMUSCULAR | Status: AC
  Filled 2015-01-14: qty 2

## 2015-01-14 MED ORDER — NITROGLYCERIN 0.4 MG SL SUBL: 0.4000 mg | SUBLINGUAL_TABLET | SUBLINGUAL | Status: DC | PRN

## 2015-01-14 MED ORDER — PRASUGREL HCL 10 MG PO TABS: 10.0000 mg | ORAL_TABLET | Freq: Every day | ORAL | Status: DC

## 2015-01-14 MED ORDER — ISOSORBIDE MONONITRATE 15 MG HALF TABLET
15.0000 mg | ORAL_TABLET | Freq: Every day | ORAL | Status: DC
  Administered 2015-01-15: 11:00:00 15 mg via ORAL
  Filled 2015-01-14: qty 1

## 2015-01-14 MED ORDER — FENTANYL CITRATE 0.05 MG/ML IJ SOLN
INTRAMUSCULAR | Status: AC
  Filled 2015-01-14: qty 2

## 2015-01-14 MED ORDER — ACETAMINOPHEN 325 MG PO TABS: 650.0000 mg | ORAL_TABLET | ORAL | Status: DC | PRN

## 2015-01-14 MED ORDER — SODIUM CHLORIDE 0.9 % IV SOLN
INTRAVENOUS | Status: DC
  Administered 2015-01-14: 1000 mL via INTRAVENOUS

## 2015-01-14 MED ORDER — SODIUM CHLORIDE 0.9 % IV SOLN
1.0000 mL/kg/h | INTRAVENOUS | Status: AC
  Administered 2015-01-14: 1 mL/kg/h via INTRAVENOUS

## 2015-01-14 MED ORDER — ASPIRIN 81 MG PO CHEW: 81.0000 mg | CHEWABLE_TABLET | Freq: Every day | ORAL | Status: DC

## 2015-01-14 MED ORDER — SODIUM CHLORIDE 0.9 % IV SOLN: 250.0000 mL | INTRAVENOUS | Status: DC | PRN

## 2015-01-14 MED ORDER — FLUOXETINE HCL 40 MG PO CAPS: 40.0000 mg | ORAL_CAPSULE | Freq: Every day | ORAL | Status: DC

## 2015-01-14 MED ORDER — SODIUM CHLORIDE 0.9 % IV SOLN
1.7500 mg/kg/h | INTRAVENOUS | Status: DC
  Filled 2015-01-14: qty 250

## 2015-01-14 MED ORDER — HYDRALAZINE HCL 20 MG/ML IJ SOLN
10.0000 mg | Freq: Once | INTRAMUSCULAR | Status: AC
  Administered 2015-01-14: 10 mg via INTRAVENOUS

## 2015-01-14 MED ORDER — SODIUM CHLORIDE 0.9 % IJ SOLN: 3.0000 mL | INTRAMUSCULAR | Status: DC | PRN

## 2015-01-14 MED ORDER — PRASUGREL HCL 10 MG PO TABS
10.0000 mg | ORAL_TABLET | Freq: Every day | ORAL | Status: DC
  Filled 2015-01-14: qty 1

## 2015-01-14 MED ORDER — BOOST / RESOURCE BREEZE PO LIQD
1.0000 | Freq: Three times a day (TID) | ORAL | Status: DC
  Filled 2015-01-14 (×6): qty 1

## 2015-01-14 MED ORDER — HYDRALAZINE HCL 20 MG/ML IJ SOLN
10.0000 mg | Freq: Four times a day (QID) | INTRAMUSCULAR | Status: DC | PRN
  Filled 2015-01-14: qty 1

## 2015-01-14 MED ORDER — HYDRALAZINE HCL 20 MG/ML IJ SOLN: 10.0000 mg | Freq: Once | INTRAMUSCULAR | Status: DC

## 2015-01-14 MED ORDER — CARVEDILOL 25 MG PO TABS
25.0000 mg | ORAL_TABLET | Freq: Two times a day (BID) | ORAL | Status: DC
  Administered 2015-01-14 – 2015-01-15 (×2): 25 mg via ORAL
  Filled 2015-01-14: qty 2
  Filled 2015-01-14 (×2): qty 1
  Filled 2015-01-14: qty 2
  Filled 2015-01-14 (×2): qty 1

## 2015-01-14 MED ORDER — SODIUM CHLORIDE 0.9 % IJ SOLN: 3.0000 mL | Freq: Two times a day (BID) | INTRAMUSCULAR | Status: DC

## 2015-01-14 MED ORDER — POTASSIUM CHLORIDE CRYS ER 20 MEQ PO TBCR
20.0000 meq | EXTENDED_RELEASE_TABLET | Freq: Every day | ORAL | Status: DC
  Administered 2015-01-14: 20 meq via ORAL
  Filled 2015-01-14 (×2): qty 1

## 2015-01-14 MED ORDER — TAMSULOSIN HCL 0.4 MG PO CAPS
0.4000 mg | ORAL_CAPSULE | Freq: Every day | ORAL | Status: DC
  Administered 2015-01-14 – 2015-01-15 (×2): 0.4 mg via ORAL
  Filled 2015-01-14 (×2): qty 1

## 2015-01-14 MED ORDER — HYDRALAZINE HCL 20 MG/ML IJ SOLN
INTRAMUSCULAR | Status: AC
  Filled 2015-01-14: qty 1

## 2015-01-14 MED ORDER — ONDANSETRON HCL 4 MG/2ML IJ SOLN: 4.0000 mg | Freq: Four times a day (QID) | INTRAMUSCULAR | Status: DC | PRN

## 2015-01-14 NOTE — Care Management Note (Signed)
    Page 1 of 1   01/15/2015     3:27:22 PM CARE MANAGEMENT NOTE 01/15/2015  Patient:  [name] [name], [name] [name]   Account Number:  [id number]  Date Initiated:  01/14/2015  Documentation initiated by:  Tayvien Kane  Subjective/Objective Assessment:   Pt s/p PCI today 2/2.     Action/Plan:   Pt to dc on Effient therapy.  Pt given free trial card ; he states," I am not going to take it, the copay is too high. "   Anticipated DC Date:  01/15/2015   Anticipated DC Plan:  [place]  CM consult  Medication Assistance      Choice offered to / List presented to:             Status of service:  Completed, signed off Medicare Important Message given?   (If response is "NO", the following Medicare IM given date fields will be blank) Date Medicare IM given:   Medicare IM given by:   Date Additional Medicare IM given:   Additional Medicare IM given by:    Discharge Disposition:  HOME/SELF CARE  Per UR Regulation:  Reviewed for med. necessity/level of care/duration of stay  If discussed at [place] of Stay Meetings, dates discussed:    Comments:  01/15/15 Ellan Lambert, RN,BSN Pt changed to Plavix by cardiologist.  Pt given written information for copay assistence with [facility] "Extra Help" Program.  He and his wife are appreciative of help.  01/14/15 Ellan Lambert, RN, BSN [phone] Await copay information.  Pt cannot receive Free Effient for a year with copay card, as he has [employer]--this is only for commercially insured patients.  It is likely that copay may be high...may want to consider less expensive alternative if possible, as pt states he will not be able to take med.  I will research copay help for pt.  I encouraged pt to discuss alternative meds with his PCP, as opposed to just stop taking the meds, as this is what he has been doing.  I stressed the danger of stopping Effient, and he stated, "well, you have to make choices."

## 2015-01-14 NOTE — CV Procedure (Signed)
Cardiac Catheterization Procedure Note  Name: [name] [name] MRN: [id number] DOB: 06-14-1951  Procedure: Right Heart Cath, Left Heart Cath, Selective Coronary Angiography, LV angiography, PCI and stenting of the first diagonal, PCI and stenting of the first OM  Indication: 64 yo WM with CAD presents with worsening CHF and declining LV function. Myoview is high risk.   Procedural Details: The right wrist was prepped, draped, and anesthetized with 1% lidocaine. Using the modified Seldinger technique a 6 Fr slender sheath was placed in the right radial artery and a 5 French sheath was placed in the right brachial vein. A Swan-Ganz catheter was used for the right heart catheterization. Standard protocol was followed for recording of right heart pressures and sampling of oxygen saturations. Fick cardiac output was calculated. Standard Judkins catheters were used for selective coronary angiography and left ventriculography. There were no immediate procedural complications. The patient was transferred to the post catheterization recovery area for further monitoring.  Procedural Findings: Hemodynamics RA 5/5/ mean 4 mm Hg RV 29/5 mm Hg PA 27/4 mean 15 mm Hg PCWP 12/16 mean 11 mm Hg LV 152/13 mm Hg AO 152/84 mean of 110 mm Hg  Oxygen saturations: PA 65% AO 97%  Cardiac Output (Fick) 4.47 L/min  Cardiac Index (Fick) 2.0 L/min/meter squared.    Coronary angiography: Coronary dominance: right  Left mainstem: Normal  Left anterior descending (LAD): The LAD is a large vessel. It is diffusely diseased in the mid to distal vessel. There is a stent in the mid vessel just past the takeoff of the first and second diagonal. There is 40-50% in stent disease in the proximal stent. There is diffuse 70-80% disease in the mid to distal vessel. The first diagonal is a large branch with 90% proximal stenosis. The second diagonal is small with 50-60% mid vessel disease.   Left circumflex (LCx): The  LCx gives rise to a single OM branch. There is 50% disease in the proximal OM. The mid OM has a focal 80% lesion.  Right coronary artery (RCA): The RCA is a very large dominant vessel. It has no significant disease.   Left ventriculography: Left ventricular systolic function is abnormal, there is global hypokinesis. LVEF is estimated at 30-35%, there is mild mitral regurgitation   PCI procedure: We proceeded at this point with PCI of the first diagonal and first OM. The patient was given Effient 60 mg po. He was anticoagulated with IV bivalirudin. After therapeutic ACT the LCA was accessed with a 6Fr XBLAD 3.5 guide. The first diagonal lesion was crossed with a prowater wire and predilated with a 2.5 mm balloon. Crossing was difficult due to severe tortuosity of the proximal diagonal. A Guideliner support catheter was placed proximal to the diagonal and then we were able to deliver a 3.0 x 16 mm Synergy stent. This was dilated to 14 atm with the stent balloon. Following PCI, there was 0% residual stenosis and TIMI 3 flow. Angiography confirmed an excellent result.  We next addressed the lesion in the mid OM1. This lesion was crossed with the prowater wire and predilated with a 2.25 mm balloon. The lesion was then stented with a 2.5 x 12 mm Synergy stent. This was inflated to 14 atm with the stent balloon. Following PCI, there was 0% residual stenosis and TIMI 3 flow. Angiography confirmed and excellent result. The patient tolerated the procedure well without complications. He was transported to the recovery area for further monitoring.   Vessel #1: First diagonal  Percent stenosis (pre): 90% TIMI flow (pre): 3 Stent: 3.0 x 16 mm Synergy Percent stenosis (post): 0% TIMI flow (post ) 3  Vessel #2: first OM Percent stenosis (pre) 80% TIMI flow (pre) 3 Stent 2.5 x 12 mm Synergy Percent stenosis (post) 0% TIMI flow (post) 3   Final Conclusions:   1. Severe 2 vessel obstructive CAD 2. Severe LV  dysfunction. 3. Normal right heart pressures. 4. Successful stenting of the first diagonal with a DES 5. Successful stenting of the first OM with a DES.  Recommendations: Continue DAPT for one year. If patient unable to afford Effient would need to load with Plavix. Continue medical therapy for LV dysfunction. Mitral insufficiency does not appear to be hemodynamically significant. I would treat his LAD disease medically unless he has refractory angina. It is fairly diffuse.    [name] [name], [name] [name] 01/14/2015, 1:11 PM

## 2015-01-14 NOTE — Progress Notes (Signed)
TR BAND REMOVAL  LOCATION:  right radial  DEFLATED PER PROTOCOL:  Yes.    TIME BAND OFF / DRESSING APPLIED:   1930   SITE UPON ARRIVAL:   Level 1  SITE AFTER BAND REMOVAL:  Level 1  REVERSE ALLEN'S TEST:    positive  CIRCULATION SENSATION AND MOVEMENT:  Within Normal Limits  Yes.    COMMENTS:

## 2015-01-14 NOTE — Interval H&P Note (Signed)
History and Physical Interval Note:  01/14/2015 11:44 AM  [name] [name]  has presented today for surgery, with the diagnosis of abnormal mioview/low ef  The various methods of treatment have been discussed with the patient and family. After consideration of risks, benefits and other options for treatment, the patient has consented to  Procedure(s): LEFT AND RIGHT HEART CATHETERIZATION WITH CORONARY ANGIOGRAM (N/A) as a surgical intervention .  The patient's history has been reviewed, patient examined, no change in status, stable for surgery.  I have reviewed the patient's chart and labs.  Questions were answered to the patient's satisfaction.   Cath Lab Visit (complete for each Cath Lab visit)  Clinical Evaluation Leading to the Procedure:   ACS: No.  Non-ACS:    Anginal Classification: CCS II  Anti-ischemic medical therapy: Maximal Therapy (2 or more classes of medications)  Non-Invasive Test Results: High-risk stress test findings: cardiac mortality >3%/year  Prior CABG: No previous CABG        Collier Salina [facility] 01/14/2015 11:45 AM

## 2015-01-14 NOTE — H&P (View-Only) (Signed)
[name] [name] Date of Birth: 09/06/1951   History of Present Illness: [name] [name] is seen for followup of his coronary disease. He was last seen by me in October 2013. He has a history of CAD. He underwent stenting of the proximal LAD and diagonal branch in 2000. Cardiac caths in 2005 Dorthula Rue MD) and 2013 ([place]) showed no obstructive disease. He is s/p MV repair at [facility] in 2004. He has a history of chronic systolic CHF and has an ICD in place. He is followed in the device clinic. His last Echo in 2012 showed an EF 50-55%.   Today he reports SOB at times. It tends to awaken him at night. He has some chest tightness with this. He denies edema or palpitations. Stays fairly active. Labs are followed by primary care.    Current Outpatient Prescriptions on File Prior to Visit  Medication Sig Dispense Refill  . atorvastatin (LIPITOR) 20 MG tablet Take 20 mg by mouth daily.      . carvedilol (COREG) 25 MG tablet Take 25 mg by mouth 2 (two) times daily with a meal.      . doxycycline (VIBRA-TABS) 100 MG tablet Take 1 tablet by mouth 2 (two) times daily.    Marland Kitchen esomeprazole (NEXIUM) 40 MG capsule Take 40 mg by mouth daily before breakfast.      . isosorbide mononitrate (IMDUR) 30 MG 24 hr tablet Take 15 mg by mouth daily.     Marland Kitchen LEVEMIR 100 UNIT/ML injection 48 UNITS JUST BEFORE BED    . niacin (NIASPAN) 500 MG CR tablet Take 500 mg by mouth at bedtime.      . potassium chloride SA (K-DUR,KLOR-CON) 20 MEQ tablet Take 20 mEq by mouth daily.      . prasugrel (EFFIENT) 10 MG TABS Take 10 mg by mouth daily.      . ranolazine (RANEXA) 1000 MG SR tablet Take 1,000 mg by mouth 2 (two) times daily.      . tamsulosin (FLOMAX) 0.4 MG CAPS capsule Take 1 capsule by mouth daily.    . valsartan (DIOVAN) 160 MG tablet Take 1 tablet (160 mg total) by mouth daily. 90 tablet 3  . nitroGLYCERIN (NITROSTAT) 0.4 MG SL tablet Place 1 tablet (0.4 mg total) under the tongue every 5 (five)  minutes x 3 doses as needed for chest pain. 25 tablet 3   No current facility-administered medications on file prior to visit.    Allergies  Allergen Reactions  . Metformin And Related Nausea Only and Other (See Comments)    Loss of appetite  . Lisinopril Cough    Past Medical History  Diagnosis Date  . Hypertension   . Hyperlipidemia   . GERD (gastroesophageal reflux disease)   . MVP (mitral valve prolapse)     s/p MV annuloplasty at [facility] 2004  . Ischemic cardiomyopathy     s/p St. [name] (Atlas) ICD implanted in [place] 2007  . Anxiety   . CAD (coronary artery disease)     a.  s/p Stent to LAD '00;  b.  PTCA 2nd diagonal '10;  c. 02/18/12 Cath: moderate nonobs dzs - med rx  . Diabetes mellitus     type II uncontrolled  . PAF (paroxysmal atrial fibrillation)     noted on ICD interrogation '10  . Episodic mood disorder   . Chronic ischemic heart disease   . Depression   . ICD (implantable cardiac defibrillator) in place  Past Surgical History  Procedure Laterality Date  . Cardiac defibrillator placement      implanted in [place], has a 7001 RV lead and a SJM Atlas ICD followed by Dr Caryl Comes  . Coronary angioplasty    . Mitral valve repair    . Coronary stent placement    . Cardiac valve replacement    . Implantable cardioverter defibrillator generator change  03/19/13    SJM Fortify ST DR generator placed by Dr Lovena Le, part of Analyze ST study  . Left heart catheterization with coronary angiogram N/A 02/17/2012    Procedure: LEFT HEART CATHETERIZATION WITH CORONARY ANGIOGRAM;  Surgeon: Jolaine Artist, MD;  Location: [facility] C[facility] LAB;  Service: Cardiovascular;  Laterality: N/A;  . Implantable cardioverter defibrillator (icd) generator change N/A 03/19/2013    Procedure: ICD GENERATOR CHANGE;  Surgeon: Evans Lance, MD;  Location: [facility] CATH LAB;  Service: Cardiovascular;  Laterality: N/A;    History  Smoking status  . Never Smoker   Smokeless tobacco  . Not on  file    History  Alcohol Use No    Family History  Problem Relation Age of Onset  . Coronary artery disease Mother   . Colon cancer Mother   . Heart attack Mother   . Heart disease Mother   . Heart failure Mother   . Hypertension Mother   . Malignant hyperthermia Mother   . Colon cancer Sister   . Coronary artery disease Brother   . Heart disease Brother     Review of Systems: As noted in history of present illness. All other systems are reviewed and are negative.  Physical Exam: BP 110/72 mmHg  Pulse 66  Ht 6' (1.829 m)  Wt 228 lb 14.4 oz (103.828 kg)  BMI 31.04 kg/m2 He is a pleasant overweight white male in no acute distress. He is normocephalic, atraumatic. Pupils are equal round and reactive to light and accommodation. Extraocular movements are full. Oropharynx is clear. Neck is supple without JVD, adenopathy, thyromegaly, or bruits. Lungs are clear. Cardiac exam reveals a regular rate and rhythm with a grade [id number] holosystolic murmur heard best at the apex radiating to the left axilla. There is no S3. His defibrillator site in the left subclavicular area appears normal. He has a healed median sternotomy scar.  Femoral and pedal pulses are 2+ and symmetric. He has no significant edema. Skin is warm and dry. He is alert and oriented x3. Cranial nerves II through XII are intact.  LABORATORY DATA: ICD check in Nov. 2015  was satisfactory.  Assessment / Plan: 1. Coronary disease status post stenting of the LAD and diagonal. Cardiac catheterization in March of 2013 showed continued patency of the stent with moderate nonobstructive disease. Will update stress Myoview at this time. Continue current medication.  2. History of congestive heart failure with chronic systolic dysfunction. Ejection fraction had improved to 50-55% by echo in 2012. He is status post ICD implant. Will update Echo.  3. History of paroxysmal atrial fibrillation by ICD check in 2010. No evidence of  recurrence.  4. Hypertension. Blood pressure is well controlled.   6. Diabetes mellitus on insulin.  7. History of mitral valve prolapse status post mitral annuloplasty in 2004. Follow up Echo.

## 2015-01-15 ENCOUNTER — Encounter (HOSPITAL_COMMUNITY): Payer: Self-pay | Admitting: Cardiology

## 2015-01-15 ENCOUNTER — Other Ambulatory Visit: Payer: Self-pay | Admitting: Physician Assistant

## 2015-01-15 DIAGNOSIS — T50905A Adverse effect of unspecified drugs, medicaments and biological substances, initial encounter: Secondary | ICD-10-CM

## 2015-01-15 DIAGNOSIS — I255 Ischemic cardiomyopathy: Secondary | ICD-10-CM | POA: Diagnosis not present

## 2015-01-15 DIAGNOSIS — I10 Essential (primary) hypertension: Secondary | ICD-10-CM | POA: Diagnosis not present

## 2015-01-15 DIAGNOSIS — R931 Abnormal findings on diagnostic imaging of heart and coronary circulation: Secondary | ICD-10-CM

## 2015-01-15 DIAGNOSIS — K219 Gastro-esophageal reflux disease without esophagitis: Secondary | ICD-10-CM | POA: Diagnosis not present

## 2015-01-15 DIAGNOSIS — E1165 Type 2 diabetes mellitus with hyperglycemia: Secondary | ICD-10-CM | POA: Diagnosis not present

## 2015-01-15 DIAGNOSIS — I340 Nonrheumatic mitral (valve) insufficiency: Secondary | ICD-10-CM | POA: Diagnosis not present

## 2015-01-15 DIAGNOSIS — I5022 Chronic systolic (congestive) heart failure: Secondary | ICD-10-CM | POA: Diagnosis not present

## 2015-01-15 DIAGNOSIS — I2510 Atherosclerotic heart disease of native coronary artery without angina pectoris: Secondary | ICD-10-CM | POA: Diagnosis not present

## 2015-01-15 DIAGNOSIS — E785 Hyperlipidemia, unspecified: Secondary | ICD-10-CM | POA: Diagnosis not present

## 2015-01-15 DIAGNOSIS — E669 Obesity, unspecified: Secondary | ICD-10-CM | POA: Diagnosis not present

## 2015-01-15 LAB — BASIC METABOLIC PANEL
ANION GAP: 6 (ref 5–15)
BUN: 15 mg/dL (ref 6–23)
CALCIUM: 8.8 mg/dL (ref 8.4–10.5)
CO2: 24 mmol/L (ref 19–32)
Chloride: 106 mmol/L (ref 96–112)
Creatinine, Ser: 1.06 mg/dL (ref 0.50–1.35)
GFR calc Af Amer: 83 mL/min — ABNORMAL LOW (ref >90)
GFR calc non Af Amer: 72 mL/min — ABNORMAL LOW (ref >90)
Glucose, Bld: 277 mg/dL — ABNORMAL HIGH (ref 70–99)
Potassium: 3.9 mmol/L (ref 3.5–5.1)
Sodium: 136 mmol/L (ref 135–145)

## 2015-01-15 LAB — GLUCOSE, CAPILLARY: GLUCOSE-CAPILLARY: 302 mg/dL — AB (ref 70–99)

## 2015-01-15 LAB — POCT I-STAT 3, VENOUS BLOOD GAS (G3P V)
Acid-base deficit: 3 mmol/L — ABNORMAL HIGH (ref 0.0–2.0)
Bicarbonate: 22.6 mEq/L (ref 20.0–24.0)
O2 SAT: 65 %
PH VEN: 7.363 — AB (ref 7.250–7.300)
PO2 VEN: 35 mmHg (ref 30.0–45.0)
TCO2: 24 mmol/L (ref 0–100)
pCO2, Ven: 39.8 mmHg — ABNORMAL LOW (ref 45.0–50.0)

## 2015-01-15 LAB — POCT ACTIVATED CLOTTING TIME: Activated Clotting Time: 527 seconds

## 2015-01-15 LAB — POCT I-STAT 3, ART BLOOD GAS (G3+)
Acid-base deficit: 3 mmol/L — ABNORMAL HIGH (ref 0.0–2.0)
Bicarbonate: 20.5 mEq/L (ref 20.0–24.0)
O2 SAT: 97 %
PH ART: 7.424 (ref 7.350–7.450)
TCO2: 21 mmol/L (ref 0–100)
pCO2 arterial: 31.3 mmHg — ABNORMAL LOW (ref 35.0–45.0)
pO2, Arterial: 84 mmHg (ref 80.0–100.0)

## 2015-01-15 LAB — CBC
HCT: 39.5 % (ref 39.0–52.0)
Hemoglobin: 14 g/dL (ref 13.0–17.0)
MCH: 29.7 pg (ref 26.0–34.0)
MCHC: 35.4 g/dL (ref 30.0–36.0)
MCV: 83.7 fL (ref 78.0–100.0)
Platelets: 202 10*3/uL (ref 150–400)
RBC: 4.72 MIL/uL (ref 4.22–5.81)
RDW: 12.4 % (ref 11.5–15.5)
WBC: 7.2 10*3/uL (ref 4.0–10.5)

## 2015-01-15 MED ORDER — CLOPIDOGREL BISULFATE 75 MG PO TABS: 300.0000 mg | ORAL_TABLET | Freq: Once | ORAL | Status: DC

## 2015-01-15 MED ORDER — CLOPIDOGREL BISULFATE 75 MG PO TABS: 75.0000 mg | ORAL_TABLET | Freq: Every day | ORAL | Status: DC

## 2015-01-15 MED ORDER — SPIRONOLACTONE 25 MG PO TABS
25.0000 mg | ORAL_TABLET | Freq: Every day | ORAL | Status: DC
  Administered 2015-01-15: 08:00:00 25 mg via ORAL
  Filled 2015-01-15: qty 1

## 2015-01-15 MED ORDER — SPIRONOLACTONE 12.5 MG HALF TABLET: 12.5000 mg | ORAL_TABLET | Freq: Every day | ORAL | Status: DC

## 2015-01-15 MED ORDER — SPIRONOLACTONE 25 MG PO TABS: 25.0000 mg | ORAL_TABLET | Freq: Every day | ORAL | Status: DC

## 2015-01-15 MED ORDER — CLOPIDOGREL BISULFATE 75 MG PO TABS
75.0000 mg | ORAL_TABLET | Freq: Once | ORAL | Status: AC
  Administered 2015-01-15: 75 mg via ORAL
  Filled 2015-01-15: qty 1

## 2015-01-15 MED ORDER — SPIRONOLACTONE 12.5 MG HALF TABLET
12.5000 mg | ORAL_TABLET | Freq: Every day | ORAL | Status: DC
  Filled 2015-01-15: qty 1

## 2015-01-15 MED ORDER — NITROGLYCERIN 0.4 MG SL SUBL: 0.4000 mg | SUBLINGUAL_TABLET | SUBLINGUAL | Status: DC | PRN

## 2015-01-15 MED FILL — Sodium Chloride IV Soln 0.9%: INTRAVENOUS | Qty: 50 | Status: AC

## 2015-01-15 NOTE — Progress Notes (Signed)
IV and tele d/c'd.  Rt wrist and brachial drsgs removed by pt, slight bruises but no hematoma. Plavix given PO per PA's order.  Written D/C instructions reviewed w/ pt and wife.  Questions answered.  Denies complaints.  Ambulated in hall well with cardiac rehab.  To front door ambulatory, refused w/c.

## 2015-01-15 NOTE — Progress Notes (Signed)
CARDIAC REHAB PHASE I   PRE:  Rate/Rhythm: 71 SR  BP:  Supine:   Sitting: 130/59  Standing:    SaO2:   MODE:  Ambulation: 1000 ft   POST:  Rate/Rhythm: 75 SR  BP:  Supine:   Sitting: 107/65  Standing:    SaO2:  0810-0905 Pt walked 1000 ft with steady gait and tolerated well. No CP. Education completed with pt and wife who voiced understanding. Gave CHF booklet as they stated they never had one before and reviewed daily weights, low sodium and when to call cardiologist. Discussed importance of also watching carbs and gave diabetic, low sodium and heart healthy diets. Discussed regular ex to help with diabetes as pt stated sugars run high. Has done CRP 2 before and wants to go to gym which is less distance. Gave pt instructions on starting his walking for ex. Has effient booklet and stressed importance of taking with stent.   Graylon Good, RN BSN  01/15/2015 9:02 AM

## 2015-01-15 NOTE — Progress Notes (Signed)
Subjective: Feels better  Objective: Vital signs in last 24 hours: Temp:  [97.7 F (36.5 C)-98.3 F (36.8 C)] 98.2 F (36.8 C) (02/02 2258) Pulse Rate:  [67-88] 75 (02/03 0546) Resp:  [12-35] 18 (02/03 0546) BP: (117-198)/(68-137) 142/85 mmHg (02/03 0546) SpO2:  [94 %-100 %] 100 % (02/03 0546) Weight:  [226 lb 6.6 oz (102.7 kg)-228 lb (103.42 kg)] 226 lb 6.6 oz (102.7 kg) (02/03 0546) Last BM Date: 01/14/15  Intake/Output from previous day: 02/02 0701 - 02/03 0700 In: 1568.3 [P.O.:713; I.V.:855.3] Out: 1800 [Urine:1800] Intake/Output this shift: Total I/O In: 692 [P.O.:240; I.V.:452] Out: 1450 [Urine:1450]  Medications Current Facility-Administered Medications  Medication Dose Route Frequency Provider Last Rate Last Dose  . acetaminophen (TYLENOL) tablet 650 mg  650 mg Oral Q4H PRN Peter M Martinique, MD      . atorvastatin (LIPITOR) tablet 20 mg  20 mg Oral q1800 Peter M Martinique, MD   20 mg at 01/14/15 1806  . carvedilol (COREG) tablet 25 mg  25 mg Oral BID WC Peter M Martinique, MD   25 mg at 01/15/15 [id number]  . feeding supplement (RESOURCE BREEZE) (RESOURCE BREEZE) liquid 1 Container  1 Container Oral TID BM Peter M Martinique, MD   1 Container at 01/14/15 2000  . FLUoxetine (PROZAC) capsule 40 mg  40 mg Oral Daily Peter M Martinique, MD   40 mg at 01/14/15 1807  . hydrALAZINE (APRESOLINE) injection 10 mg  10 mg Intravenous Q6H PRN Eileen Stanford, PA-C      . insulin aspart (novoLOG) injection 0-9 Units  0-9 Units Subcutaneous TID WC Dayna N Dunn, PA-C   5 Units at 01/14/15 1934  . insulin detemir (LEVEMIR) injection 50 Units  50 Units Subcutaneous QHS Peter M Martinique, MD   50 Units at 01/14/15 2232  . irbesartan (AVAPRO) tablet 150 mg  150 mg Oral Daily Peter M Martinique, MD   150 mg at 01/14/15 1806  . isosorbide mononitrate (IMDUR) 24 hr tablet 15 mg  15 mg Oral Daily Peter M Martinique, MD      . nitroGLYCERIN (NITROSTAT) SL tablet 0.4 mg  0.4 mg Sublingual Q5 Min x 3 PRN Peter M Martinique,  MD      . ondansetron [facility]) injection 4 mg  4 mg Intravenous Q6H PRN Peter M Martinique, MD      . potassium chloride SA (K-DUR,KLOR-CON) CR tablet 20 mEq  20 mEq Oral Daily Peter M Martinique, MD   20 mEq at 01/14/15 1643  . prasugrel (EFFIENT) tablet 10 mg  10 mg Oral Daily Peter M Martinique, MD      . ranolazine [facility]) 12 hr tablet 1,000 mg  1,000 mg Oral BID Peter M Martinique, MD   1,000 mg at 01/14/15 2054  . tamsulosin (FLOMAX) capsule 0.4 mg  0.4 mg Oral Daily Peter M Martinique, MD   0.4 mg at 01/14/15 1807    PE: General appearance: alert, cooperative and no distress Lungs: clear to auscultation bilaterally Heart: regular rate and rhythm and 1/6 sys MM Axillae Extremities: No LEE Pulses: 2+ and symmetric Skin: Warm and dry.  Mild ecchymosis in the right wrist. Neurologic: Grossly normal  Lab Results:   Recent Labs  01/13/15 0937 01/15/15 0431  WBC 7.0 7.2  HGB 15.4 14.0  HCT 45.5 39.5  PLT 249 202   BMET  Recent Labs  01/13/15 0937 01/15/15 0431  NA 134* 136  K 4.5 3.9  CL 99 106  CO2 25  24  GLUCOSE 325* 277*  BUN 23 15  CREATININE 1.14 1.06  CALCIUM 9.4 8.8   PT/INR  Recent Labs  01/13/15 1040  LABPROT 13.7  INR 1.05     Assessment/Plan   Principal Problem:   Abnormal nuclear stress test Active Problems:   Mitral insufficiency   Ischemic cardiomyopathy   CAD (coronary artery disease)   Chronic systolic dysfunction of left ventricle   DM   Obesity: 30-34BMI  SP left and right heart caths revealing Severe 2 vessel obstructive CAD, Severe LV dysfunction, Normal right heart pressures.  He underwent successful stenting of the first diagonal with a DES and stenting of the first OM with a DES.  DAPT for a year.  Medical treatment for LAD unless angina.   Uncontrolled DM.  CBG 325, 277. Obesity.   Will refer to OP Reg Dietician.  ASA, statin, coreg 25 bid, Imdur 15, Effient, ranexa 1000 BID. Starting aldactone for better BP control and LV dysfunction.   SCr  stable.  Care mgt consult for meds.   Ambulate with CR this morning and DC home.      LOS: 1 day    HAGER, BRYAN PA-C 01/15/2015 6:43 AM  Patient seen and examined and history reviewed. Agree with above findings and plan. Patient complained of SOB post procedure with significant elevation of BP. BP improved and SOB resolved. Right wrist and brachial area without hematoma. Glucose levels have been quite high. Patient has a very poor diet with lots of pasta and sodas. Also eats a lot of salt. States he has trouble affording medication including insulin and sometimes he runs out. He is stable for DC today. He needs close follow up with primary care to manage his diabetes. Will arrange dietary counseling. Will stop Ranexa and niacin. Continue Coreg, Diovan, Imdur, and ASA. Will DC on Effient but will probably need to switch to Plavix. Will add aldactone 25 mg daily and DC potassium. He will need potassium level in 3-4 days. Care management to assist with meds.   Peter Martinique, [place] 01/15/2015 7:54 AM

## 2015-01-15 NOTE — Discharge Summary (Signed)
Physician Discharge Summary    Cardiologist:   Patient ID: [name] [name] MRN: [id number] DOB/AGE: February 07, 1951 64 y.o.  Admit date: 01/14/2015 Discharge date: 01/15/2015  Admission Diagnoses:  Abnormal nuclear stress test  Discharge Diagnoses:  Principal Problem:   Abnormal nuclear stress test Active Problems:   Mitral insufficiency   Ischemic cardiomyopathy   CAD (coronary artery disease)   Chronic systolic dysfunction of left ventricle   Discharged Condition: stable  Hospital Course:  [name] [name] is seen for followup of his coronary disease. He was last seen by Dr. Martinique in October 2013. He has a history of CAD. He underwent stenting of the proximal LAD and diagonal branch in 2000. Cardiac caths in 2005 [name] Rue MD) and 2013 ([name] [name]) showed no obstructive disease. He is s/p MV repair at [facility] in 2004. He has a history of chronic systolic CHF and has an ICD in place. He is followed in the device clinic. His last Echo in 2012 showed an EF 50-55%. He reported SOB at times. It tends to awaken him at night. He has some chest tightness with this. He denies edema or palpitations. Stays fairly active. Labs are followed by primary care.   The patient underwent a nuclear stress test which came back as high risk with moderate-sized and intensity, reversible inferior perfusion defect - there is adjacent bowel which could lead to some attenuation, however, there is severe inferior hypokinesis.  He presented for left and right heart caths revealing Severe 2 vessel obstructive CAD, Severe LV dysfunction, Normal right heart pressures. He underwent successful stenting of the first diagonal with a DES and stenting of the first OM with a DES. DAPT for a year. Medical treatment for LAD unless angina. Uncontrolled DM. CBG 325, 277. Obesity. Will refer to OP Reg Dietician. ASA, statin, coreg 25 bid, Imdur 15, Effient.  The patient can not get the Effient for free or at  reduced cost because he is on medicare.  He states that he can not afford it.  He was changed to Plavix prior to discharge.  Starting aldactone for better BP control and LV dysfunction. SCr stable.  He ambulated well with CR.  Ranexa, niacin and potassium were DCd. The patient was seen by Dr. Martinique who felt he was stable for DC home.  Check K+ on Friday.     Consults: [facility]  Significant Diagnostic Studies:  Cardiac Catheterization Procedure Note  Name: [name] [name] MRN: [id number] DOB: 08-06-1951  Procedure: Right Heart Cath, Left Heart Cath, Selective Coronary Angiography, LV angiography, PCI and stenting of the first diagonal, PCI and stenting of the first OM  Indication: 64 yo WM with CAD presents with worsening CHF and declining LV function. Myoview is high risk.  Procedural Details: The right wrist was prepped, draped, and anesthetized with 1% lidocaine. Using the modified Seldinger technique a 6 Fr slender sheath was placed in the right radial artery and a 5 French sheath was placed in the right brachial vein. A Swan-Ganz catheter was used for the right heart catheterization. Standard protocol was followed for recording of right heart pressures and sampling of oxygen saturations. Fick cardiac output was calculated. Standard Judkins catheters were used for selective coronary angiography and left ventriculography. There were no immediate procedural complications. The patient was transferred to the post catheterization recovery area for further monitoring.  Procedural Findings: Hemodynamics RA 5/5/ mean 4 mm Hg RV 29/5 mm Hg PA 27/4 mean 15 mm Hg PCWP 12/16 mean 11  mm Hg LV 152/13 mm Hg AO 152/84 mean of 110 mm Hg  Oxygen saturations: PA 65% AO 97%  Cardiac Output (Fick) 4.47 L/min  Cardiac Index (Fick) 2.0 L/min/meter squared.   Coronary angiography: Coronary dominance: right  Left mainstem: Normal  Left anterior descending (LAD): The LAD is a  large vessel. It is diffusely diseased in the mid to distal vessel. There is a stent in the mid vessel just past the takeoff of the first and second diagonal. There is 40-50% in stent disease in the proximal stent. There is diffuse 70-80% disease in the mid to distal vessel. The first diagonal is a large branch with 90% proximal stenosis. The second diagonal is small with 50-60% mid vessel disease.   Left circumflex (LCx): The LCx gives rise to a single OM branch. There is 50% disease in the proximal OM. The mid OM has a focal 80% lesion.  Right coronary artery (RCA): The RCA is a very large dominant vessel. It has no significant disease.   Left ventriculography: Left ventricular systolic function is abnormal, there is global hypokinesis. LVEF is estimated at 30-35%, there is mild mitral regurgitation   PCI procedure: We proceeded at this point with PCI of the first diagonal and first OM. The patient was given Effient 60 mg po. He was anticoagulated with IV bivalirudin. After therapeutic ACT the LCA was accessed with a 6Fr XBLAD 3.5 guide. The first diagonal lesion was crossed with a prowater wire and predilated with a 2.5 mm balloon. Crossing was difficult due to severe tortuosity of the proximal diagonal. A Guideliner support catheter was placed proximal to the diagonal and then we were able to deliver a 3.0 x 16 mm Synergy stent. This was dilated to 14 atm with the stent balloon. Following PCI, there was 0% residual stenosis and TIMI 3 flow. Angiography confirmed an excellent result.  We next addressed the lesion in the mid OM1. This lesion was crossed with the prowater wire and predilated with a 2.25 mm balloon. The lesion was then stented with a 2.5 x 12 mm Synergy stent. This was inflated to 14 atm with the stent balloon. Following PCI, there was 0% residual stenosis and TIMI 3 flow. Angiography confirmed and excellent result. The patient tolerated the procedure well without complications. He was  transported to the recovery area for further monitoring.   Vessel #1: First diagonal Percent stenosis (pre): 90% TIMI flow (pre): 3 Stent: 3.0 x 16 mm Synergy Percent stenosis (post): 0% TIMI flow (post ) 3  Vessel #2: first OM Percent stenosis (pre) 80% TIMI flow (pre) 3 Stent 2.5 x 12 mm Synergy Percent stenosis (post) 0% TIMI flow (post) 3   Final Conclusions:  1. Severe 2 vessel obstructive CAD 2. Severe LV dysfunction. 3. Normal right heart pressures. 4. Successful stenting of the first diagonal with a DES 5. Successful stenting of the first OM with a DES.  Recommendations: Continue DAPT for one year. If patient unable to afford Effient would need to load with Plavix. Continue medical therapy for LV dysfunction. Mitral insufficiency does not appear to be hemodynamically significant. I would treat his LAD disease medically unless he has refractory angina. It is fairly diffuse.    Peter [name] [name], [place] 01/14/2015,  Treatments: See above  Discharge Exam: Blood pressure 130/59, pulse 70, temperature 98.2 F (36.8 C), temperature source Oral, resp. rate 20, height 6' (1.829 m), weight 226 lb 6.6 oz (102.7 kg), SpO2 98 %.   Disposition: 01-Home or Self  Care      Discharge Instructions    Diet - low sodium heart healthy    Complete by:  As directed      Discharge instructions    Complete by:  As directed   No lifting with right arm for three days.     Increase activity slowly    Complete by:  As directed             Medication List    STOP taking these medications        EFFIENT 10 MG Tabs tablet  Generic drug:  prasugrel     niacin 500 MG CR tablet  Commonly known as:  NIASPAN     potassium chloride SA 20 MEQ tablet  Commonly known as:  K-DUR,KLOR-CON     RANEXA 1000 MG SR tablet  Generic drug:  ranolazine      TAKE these medications        aspirin EC 81 MG tablet  Take 81 mg by mouth daily.     atorvastatin 20 MG tablet  Commonly known as:   LIPITOR  Take 20 mg by mouth daily.     carvedilol 25 MG tablet  Commonly known as:  COREG  Take 25 mg by mouth 2 (two) times daily with a meal.     clopidogrel 75 MG tablet  Commonly known as:  PLAVIX  Take 1 tablet (75 mg total) by mouth daily.  Start taking on:  01/16/2015     FLUoxetine 40 MG capsule  Commonly known as:  PROZAC  Take 40 mg by mouth daily.     isosorbide mononitrate 30 MG 24 hr tablet  Commonly known as:  IMDUR  Take 15 mg by mouth daily.     LEVEMIR 100 UNIT/ML injection  Generic drug:  insulin detemir  Inject 50 Units into the skin at bedtime. 48 UNITS JUST BEFORE BED     nitroGLYCERIN 0.4 MG SL tablet  Commonly known as:  NITROSTAT  Place 1 tablet (0.4 mg total) under the tongue every 5 (five) minutes x 3 doses as needed for chest pain.     nitroGLYCERIN 0.4 MG SL tablet  Commonly known as:  NITROSTAT  Place 1 tablet (0.4 mg total) under the tongue every 5 (five) minutes x 3 doses as needed for chest pain.     spironolactone 25 MG tablet  Commonly known as:  ALDACTONE  Take 1 tablet (25 mg total) by mouth daily.     tamsulosin 0.4 MG Caps capsule  Commonly known as:  FLOMAX  Take 1 capsule by mouth daily.     valsartan 160 MG tablet  Commonly known as:  DIOVAN  Take 1 tablet (160 mg total) by mouth daily.       Follow-up Information    Follow up with Courtlynn Holloman, PA-C On 01/31/2015.   Specialty:  Physician Assistant   Why:  8:00 AM   Contact information:   [address] [place] [address] [phone]      Greater than 30 minutes was spent completing the patient's discharge.    SignedTarri Fuller, [place] 01/15/2015, 9:46 AM

## 2015-01-20 ENCOUNTER — Other Ambulatory Visit: Payer: Self-pay

## 2015-01-20 MED ORDER — CLOPIDOGREL BISULFATE 75 MG PO TABS: 75.0000 mg | ORAL_TABLET | Freq: Every day | ORAL | Status: DC

## 2015-01-20 MED ORDER — SPIRONOLACTONE 25 MG PO TABS: 25.0000 mg | ORAL_TABLET | Freq: Every day | ORAL | Status: DC

## 2015-01-20 NOTE — Telephone Encounter (Signed)
Rx(s) sent to pharmacy electronically.  

## 2015-01-21 ENCOUNTER — Other Ambulatory Visit: Payer: Self-pay | Admitting: *Deleted

## 2015-01-21 MED ORDER — CARVEDILOL 25 MG PO TABS: 25.0000 mg | ORAL_TABLET | Freq: Two times a day (BID) | ORAL | Status: DC

## 2015-01-21 MED ORDER — ISOSORBIDE MONONITRATE ER 30 MG PO TB24: 15.0000 mg | ORAL_TABLET | Freq: Every day | ORAL | Status: DC

## 2015-01-24 DIAGNOSIS — E785 Hyperlipidemia, unspecified: Secondary | ICD-10-CM | POA: Diagnosis not present

## 2015-01-24 DIAGNOSIS — I10 Essential (primary) hypertension: Secondary | ICD-10-CM | POA: Diagnosis not present

## 2015-01-24 DIAGNOSIS — E1165 Type 2 diabetes mellitus with hyperglycemia: Secondary | ICD-10-CM | POA: Diagnosis not present

## 2015-01-24 DIAGNOSIS — I2510 Atherosclerotic heart disease of native coronary artery without angina pectoris: Secondary | ICD-10-CM | POA: Diagnosis not present

## 2015-01-28 ENCOUNTER — Ambulatory Visit (INDEPENDENT_AMBULATORY_CARE_PROVIDER_SITE_OTHER): Payer: Commercial Managed Care - HMO | Admitting: Cardiology

## 2015-01-28 ENCOUNTER — Encounter: Payer: Self-pay | Admitting: Cardiology

## 2015-01-28 VITALS — BP 130/80 | HR 70 | Ht 72.0 in | Wt 228.9 lb

## 2015-01-28 DIAGNOSIS — R0602 Shortness of breath: Secondary | ICD-10-CM

## 2015-01-28 DIAGNOSIS — I2510 Atherosclerotic heart disease of native coronary artery without angina pectoris: Secondary | ICD-10-CM | POA: Diagnosis not present

## 2015-01-28 NOTE — Patient Instructions (Signed)
Your physician recommends that you schedule a follow-up appointment in: 2 MONTHS WITH DR Martinique   Your physician recommends that you HAVE LAB WORK TODAY  WEIGH DAILY AND CALL IF WEIGHT IS ABOVE 3 LBS PLEASE CALL.

## 2015-01-28 NOTE — Progress Notes (Signed)
Cardiology Office Note   Date:  01/28/2015   ID:  [name] [name], DOB [birth year], MRN [id number]  PCP:  Garwin Brothers, MD  Cardiologist:  Dr. P. Martinique     Chief Complaint  Patient presents with  . post procedure    cardiac cath      History of Present Illness: [name] [name] is a 64 y.o. male who presents for post hospitalization for Stent placed for chest pain and abnormal nuc study.    He has a history of CAD. He underwent stenting of the proximal LAD and diagonal branch in 2000. Cardiac caths in 2005 Dorthula Rue MD) and 2013 ([place]) showed no obstructive disease. He is s/p MV repair at [facility] in 2004. He has a history of chronic systolic CHF and has an ICD in place. He is followed in the device clinic. His last Echo in 2012 showed an EF 50-55%. He reported SOB at times. It tends to awaken him at night. He had some chest tightness with this. He denies edema or palpitations. Stays fairly active. Labs are followed by primary care. New Echo 01/08/15 with EF 30-35% with moderate Mitral regurg. Lt atrium was severely dilated.   The patient underwent a nuclear stress test which came back as high risk with moderate-sized and intensity, reversible inferior perfusion defect - there is adjacent bowel which could lead to some attenuation, however, there is severe inferior hypokinesis. He presented for left and right heart caths revealing Severe 2 vessel obstructive CAD, Severe LV dysfunction, Normal right heart pressures. He underwent successful stenting of the first diagonal with a DES and stenting of the first OM with a DES. DAPT for a year. Medical treatment for LAD unless angina. Uncontrolled DM. CBG 325, 277. Obesity. Will refer to OP Reg Dietician. ASA, statin, coreg 25 bid, Imdur 15, Effient. The patient can not get the Effient for free or at reduced cost because he is on medicare. He states that he can not afford it. He was changed to Plavix prior to  discharge.  His EF was 30-35% at cath as well.   Today the patient denies any chest pain except for one brief fleeting left anterior discomfort. Has not needed any nitroglycerin. He has seen his primary care physician back for his diabetes and will follow closely to control his glucose. We discussed dietary counseling but pt declines dietary counseling. He has cut back on bread he was eating very poorly prior to hospitalization he is taking responsibility for his diet at this point. We discussed the importance of exercise and he will start walking again. He has been watching the salt in his diet as well. Prior to discharge his potassium was stopped and he needs a follow-up potassium evaluation.  He weighs daily and his weight has been stable at home.  Past Medical History  Diagnosis Date  . Hypertension   . Hyperlipidemia   . GERD (gastroesophageal reflux disease)   . MVP (mitral valve prolapse)     s/p MV annuloplasty at [facility] 2004  . Ischemic cardiomyopathy     s/p St. [name] (Atlas) ICD implanted in [place] 2007  . Anxiety   . CAD (coronary artery disease)     a.  s/p Stent to LAD '00;  b.  PTCA 2nd diagonal '10;  c. 02/18/12 Cath: moderate nonobs dzs - med rx  . PAF (paroxysmal atrial fibrillation)     noted on ICD interrogation '10  . Episodic mood disorder   .  Chronic ischemic heart disease   . Depression   . AICD (automatic cardioverter/defibrillator) present   . CHF (congestive heart failure)   . Heart murmur   . Anginal pain   . Myocardial infarction ~ 1993    "cardiac arrest @ [employer] [facility]  . Myocardial infarct, old     "/EKGs"  . Type II diabetes mellitus     uncontrolled  . ED (erectile dysfunction)     Past Surgical History  Procedure Laterality Date  . Cardiac defibrillator placement  2007    implanted in [place], has a 7001 RV lead and a SJM Atlas ICD followed by Dr Caryl Comes  . Mitral valve annuloplasty  2004    Archie Endo 02/17/2012  . Coronary stent  placement  ~ 2000  . Cardiac valve replacement    . Implantable cardioverter defibrillator generator change  03/19/13    SJM Fortify ST DR generator placed by Dr Lovena Le, part of Analyze ST study  . Left heart catheterization with coronary angiogram N/A 02/17/2012    Procedure: LEFT HEART CATHETERIZATION WITH CORONARY ANGIOGRAM;  Surgeon: Jolaine Artist, MD;  Location: [facility] CATH LAB;  Service: Cardiovascular;  Laterality: N/A;  . Implantable cardioverter defibrillator (icd) generator change N/A 03/19/2013    Procedure: ICD GENERATOR CHANGE;  Surgeon: Evans Lance, MD;  Location: [facility] CATH LAB;  Service: Cardiovascular;  Laterality: N/A;  . Cardiac catheterization  08/2009    diagnostic/notes 09/03/2009  . Coronary angioplasty  08/2009    Archie Endo 09/03/2009  . Coronary angioplasty with stent placement  01/14/2015    "2"  . Left and right heart catheterization with coronary angiogram N/A 01/14/2015    Procedure: LEFT AND RIGHT HEART CATHETERIZATION WITH CORONARY ANGIOGRAM;  Surgeon: Peter M Martinique, MD;  Location: [facility] CATH LAB;  Service: Cardiovascular;  Laterality: N/A;     Current Outpatient Prescriptions  Medication Sig Dispense Refill  . aspirin EC 81 MG tablet Take 81 mg by mouth daily.    Marland Kitchen atorvastatin (LIPITOR) 20 MG tablet Take 20 mg by mouth daily.      . carvedilol (COREG) 25 MG tablet Take 1 tablet (25 mg total) by mouth 2 (two) times daily with a meal. 180 tablet 4  . clopidogrel (PLAVIX) 75 MG tablet Take 1 tablet (75 mg total) by mouth daily. 90 tablet 3  . FLUoxetine (PROZAC) 40 MG capsule Take 40 mg by mouth daily.    . isosorbide mononitrate (IMDUR) 30 MG 24 hr tablet Take 0.5 tablets (15 mg total) by mouth daily. 45 tablet 3  . LEVEMIR 100 UNIT/ML injection Inject 50 Units into the skin at bedtime. 48 UNITS JUST BEFORE BED    . nitroGLYCERIN (NITROSTAT) 0.4 MG SL tablet Place 1 tablet (0.4 mg total) under the tongue every 5 (five) minutes x 3 doses as needed for chest pain. 25 tablet 12    . spironolactone (ALDACTONE) 25 MG tablet Take 1 tablet (25 mg total) by mouth daily. 90 tablet 3  . tamsulosin (FLOMAX) 0.4 MG CAPS capsule Take 1 capsule by mouth daily.    . valsartan (DIOVAN) 160 MG tablet Take 1 tablet (160 mg total) by mouth daily. 90 tablet 3  . nitroGLYCERIN (NITROSTAT) 0.4 MG SL tablet Place 1 tablet (0.4 mg total) under the tongue every 5 (five) minutes x 3 doses as needed for chest pain. 25 tablet 3   No current facility-administered medications for this visit.    Allergies:   Metformin and related and Lisinopril  Social History:  The patient  reports that he has never smoked. He has never used smokeless tobacco. He reports that he drinks alcohol. He reports that he uses illicit drugs.   Family History:  The patient's family history includes Colon cancer in his mother and sister; Coronary artery disease in his brother and mother; Heart attack in his mother; Heart disease in his brother and mother; Heart failure in his mother; Hypertension in his mother; Malignant hyperthermia in his mother.    ROS:  General:no colds or fevers, no weight changes Skin:no rashes or ulcers HEENT:no blurred vision, no congestion CV:see HPI PUL:see HPI GI:no diarrhea constipation or melena, no indigestion GU:no hematuria, no dysuria MS:no joint pain, no claudication Neuro:no syncope, no lightheadedness Endo:+ diabetes still elevated glucose but following with PCP, no thyroid disease  Wt Readings from Last 3 Encounters:  01/28/15 228 lb 14.4 oz (103.828 kg)  01/15/15 226 lb 6.6 oz (102.7 kg)  01/10/15 228 lb (103.42 kg)     PHYSICAL EXAM: VS:  BP 130/80 mmHg  Pulse 70  Ht 6' (1.829 m)  Wt 228 lb 14.4 oz (103.828 kg)  BMI 31.04 kg/m2 , BMI Body mass index is 31.04 kg/(m^2). General:Pleasant affect, NAD Skin:Warm and dry, brisk capillary refill HEENT:normocephalic, sclera clear, mucus membranes moist Neck:supple, no JVD, no bruits  Heart:S1S2 RRR with 2/6 systolic  murmur, no gallup, rub or click Lungs:clear without rales, rhonchi, or wheezes [id number], non tender, + BS, do not palpate liver spleen or masses Ext:no lower ext edema, 2+ pedal pulses, 2+ radial pulses Neuro:alert and oriented, MAE, follows commands, + facial symmetry    EKG:  EKG is ordered today. The ekg ordered today demonstrates SR rate of 70 with PVC no acute changes.   Recent Labs: 01/15/2015: BUN 15; Creatinine 1.06; Hemoglobin 14.0; Platelets 202; Potassium 3.9; Sodium 136    Lipid Panel    Component Value Date/Time   CHOL 159 11/19/2011 0844   TRIG 139.0 11/19/2011 0844   HDL 41.10 11/19/2011 0844   CHOLHDL 4 11/19/2011 0844   VLDL 27.8 11/19/2011 0844   LDLCALC 90 11/19/2011 0844       Other studies Reviewed: Additional studies/ records that were reviewed today include: Cardiac cath recent hospitalization..ICD check in  10/2014.   ASSESSMENT AND PLAN:  1.  Coronary disease status post stenting of the LAD and diagonal. Cardiac catheterization in March of 2013 showed continued patency of the stent with moderate nonobstructive disease. Recent abnormal stress Myoview was abnormal and pt underwent cath resulting in first diagonal synergy stent for 90% stenosis; and first OM stenosis with 80% stenosis receiving a synergy stent. No further angina and no complaints today. Continue current medications including Plavix and aspirin.  He will follow-up with Dr. Martinique in 2 months or if increasing weight.  2. History of congestive heart failure with chronic systolic dysfunction. Ejection fraction had improved to 50-55% by echo in 2012. He is status post ICD implant. Recent EF by echo and cardiac cath 30-35%. He is on an ARB, beta blocker and Aldactone.  He is to have an ICD check  Tomorrow  3. History of paroxysmal atrial fibrillation by ICD check in 2010. No evidence of recurrence. Denies any rapid heartbeats.  4. Hypertension, essential. Blood pressure is well controlled.    6. Diabetes mellitus on insulin. Has not been well controlled- patient now aware of importance and is following up with his primary care.  7. History of mitral valve prolapse status post mitral  annuloplasty in 2004. Recent echo see above.  8. Hyperlipidemia followed by [place] primary care  Current medicines are reviewed with the patient today.  The patient Has no concerns regarding medicines.  The following changes have been made:  See above Labs/ tests ordered today include:see above  Disposition:   FU:  see above  Signed, Isaiah Serge, NP  01/28/2015 3:14 PM    [facility] [place], [place], [place], [place] Phone: [phone]; Fax: [phone]

## 2015-01-29 ENCOUNTER — Ambulatory Visit (INDEPENDENT_AMBULATORY_CARE_PROVIDER_SITE_OTHER): Payer: Commercial Managed Care - HMO | Admitting: *Deleted

## 2015-01-29 DIAGNOSIS — I255 Ischemic cardiomyopathy: Secondary | ICD-10-CM | POA: Diagnosis not present

## 2015-01-29 LAB — MDC_IDC_ENUM_SESS_TYPE_INCLINIC
Battery Remaining Longevity: 84 mo
Brady Statistic RA Percent Paced: 0.28 %
Brady Statistic RV Percent Paced: 0.67 %
Date Time Interrogation Session: 20160217152447
HighPow Impedance: 46.0101
Implantable Pulse Generator Serial Number: 1056971
Lead Channel Impedance Value: 350 Ohm
Lead Channel Pacing Threshold Amplitude: 0.5 V
Lead Channel Pacing Threshold Pulse Width: 0.5 ms
Lead Channel Pacing Threshold Pulse Width: 0.5 ms
Lead Channel Sensing Intrinsic Amplitude: 11.9 mV
Lead Channel Setting Pacing Amplitude: 2.5 V
Lead Channel Setting Pacing Pulse Width: 0.5 ms
MDC IDC MSMT LEADCHNL RA IMPEDANCE VALUE: 412.5 Ohm
MDC IDC MSMT LEADCHNL RA PACING THRESHOLD AMPLITUDE: 0.5 V
MDC IDC MSMT LEADCHNL RA PACING THRESHOLD PULSEWIDTH: 0.5 ms
MDC IDC MSMT LEADCHNL RA SENSING INTR AMPL: 2.9 mV
MDC IDC MSMT LEADCHNL RV PACING THRESHOLD AMPLITUDE: 1.25 V
MDC IDC SET LEADCHNL RA PACING AMPLITUDE: 2 V
MDC IDC SET LEADCHNL RV SENSING SENSITIVITY: 0.5 mV
Zone Setting Detection Interval: 310 ms

## 2015-01-29 LAB — BASIC METABOLIC PANEL WITH GFR
BUN: 20 mg/dL (ref 6–23)
CALCIUM: 9.1 mg/dL (ref 8.4–10.5)
CO2: 23 meq/L (ref 19–32)
CREATININE: 1.3 mg/dL (ref 0.50–1.35)
Chloride: 100 mEq/L (ref 96–112)
GFR, Est African American: 66 mL/min
GFR, Est Non African American: 57 mL/min — ABNORMAL LOW
GLUCOSE: 344 mg/dL — AB (ref 70–99)
Potassium: 4.1 mEq/L (ref 3.5–5.3)
Sodium: 133 mEq/L — ABNORMAL LOW (ref 135–145)

## 2015-01-29 LAB — BRAIN NATRIURETIC PEPTIDE: Brain Natriuretic Peptide: 112.2 pg/mL — ABNORMAL HIGH (ref 0.0–100.0)

## 2015-01-29 NOTE — Progress Notes (Signed)
ICD check in clinic. Normal device function. Thresholds and sensing consistent with previous device measurements. Impedance trends stable over time. No evidence of any ventricular arrhythmias. 1 mode switch lasting 6 seconds. 61 PMT episodes since 08-07-14. No PMT episodes since 01-06-15. PVARP 275 ms. Stents were placed couple weeks ago. Histogram distribution appropriate for patient and level of activity. ST changes made per protocol. Device programmed at appropriate safety margins. Device programmed to optimize intrinsic conduction. Estimated longevity 6.1 to 7.0 years. Pt enrolled in remote follow-up. Alert tones/vibration demonstrated for patient. [place] 04-30-15 and ROV in August with GT.

## 2015-01-31 ENCOUNTER — Ambulatory Visit: Payer: Commercial Managed Care - HMO | Admitting: Physician Assistant

## 2015-02-03 ENCOUNTER — Other Ambulatory Visit: Payer: Self-pay | Admitting: *Deleted

## 2015-02-03 MED ORDER — ISOSORBIDE MONONITRATE ER 30 MG PO TB24: 15.0000 mg | ORAL_TABLET | Freq: Every day | ORAL | Status: DC

## 2015-02-06 ENCOUNTER — Encounter: Payer: Self-pay | Admitting: Internal Medicine

## 2015-02-24 DIAGNOSIS — I2510 Atherosclerotic heart disease of native coronary artery without angina pectoris: Secondary | ICD-10-CM | POA: Diagnosis not present

## 2015-02-24 DIAGNOSIS — I10 Essential (primary) hypertension: Secondary | ICD-10-CM | POA: Diagnosis not present

## 2015-02-24 DIAGNOSIS — E785 Hyperlipidemia, unspecified: Secondary | ICD-10-CM | POA: Diagnosis not present

## 2015-02-24 DIAGNOSIS — E1165 Type 2 diabetes mellitus with hyperglycemia: Secondary | ICD-10-CM | POA: Diagnosis not present

## 2015-02-26 ENCOUNTER — Other Ambulatory Visit: Payer: Self-pay

## 2015-02-26 MED ORDER — SPIRONOLACTONE 25 MG PO TABS: 25.0000 mg | ORAL_TABLET | Freq: Every day | ORAL | Status: DC

## 2015-02-26 MED ORDER — CLOPIDOGREL BISULFATE 75 MG PO TABS: 75.0000 mg | ORAL_TABLET | Freq: Every day | ORAL | Status: DC

## 2015-04-22 ENCOUNTER — Encounter: Payer: Self-pay | Admitting: Cardiology

## 2015-04-22 ENCOUNTER — Ambulatory Visit (INDEPENDENT_AMBULATORY_CARE_PROVIDER_SITE_OTHER): Payer: Commercial Managed Care - HMO | Admitting: Cardiology

## 2015-04-22 VITALS — BP 122/82 | HR 74 | Ht 72.0 in | Wt 227.7 lb

## 2015-04-22 DIAGNOSIS — I255 Ischemic cardiomyopathy: Secondary | ICD-10-CM

## 2015-04-22 DIAGNOSIS — I259 Chronic ischemic heart disease, unspecified: Secondary | ICD-10-CM

## 2015-04-22 DIAGNOSIS — I509 Heart failure, unspecified: Secondary | ICD-10-CM

## 2015-04-22 DIAGNOSIS — I5022 Chronic systolic (congestive) heart failure: Secondary | ICD-10-CM | POA: Insufficient documentation

## 2015-04-22 DIAGNOSIS — I340 Nonrheumatic mitral (valve) insufficiency: Secondary | ICD-10-CM

## 2015-04-22 DIAGNOSIS — I2510 Atherosclerotic heart disease of native coronary artery without angina pectoris: Secondary | ICD-10-CM

## 2015-04-22 HISTORY — DX: Heart failure, unspecified: I50.9

## 2015-04-22 MED ORDER — CLOPIDOGREL BISULFATE 75 MG PO TABS: 75.0000 mg | ORAL_TABLET | Freq: Every day | ORAL | Status: DC

## 2015-04-22 MED ORDER — SPIRONOLACTONE 25 MG PO TABS: 25.0000 mg | ORAL_TABLET | Freq: Every day | ORAL | Status: DC

## 2015-04-22 NOTE — Patient Instructions (Addendum)
We will get a copy of your last lab work  Continue your current therapy  Work on getting your diabetes under control  I will see you in 6 months.

## 2015-04-22 NOTE — Progress Notes (Signed)
[name] [name] Date of Birth: [birth year]   History of Present Illness: [name] is seen for followup of his coronary disease.  He has a history of CAD. He underwent stenting of the proximal LAD and diagonal branch in 2000. Cardiac caths in 2005 Dorthula Rue MD) and 2013 ([place]) showed no obstructive disease. He is s/p MV repair at [facility] in 2004. He has chronic moderate MR. He has a history of chronic systolic CHF and has an ICD in place. He is followed in the device clinic.  In February 2016 he underwent right and left heart cath. He had stenting of the first diagonal and first OM with DES. There was diffuse LAD disease treated medically. EF 30-35%. Mild MR and normal right heart pressures. Echo and Myoview studies in January noted below.  Today he reports he is doing well. No chest pain or dyspnea. Occasional dizziness if he jumps up too quickly. He does note more energy since his last PCI. He reports A1c was up to 13. Now down to 8-9. Has missed his Levemir due to cost. No ICD therapies.    Current Outpatient Prescriptions on File Prior to Visit  Medication Sig Dispense Refill  . aspirin EC 81 MG tablet Take 81 mg by mouth daily.    Marland Kitchen atorvastatin (LIPITOR) 20 MG tablet Take 20 mg by mouth daily.      . carvedilol (COREG) 25 MG tablet Take 1 tablet (25 mg total) by mouth 2 (two) times daily with a meal. 180 tablet 4  . FLUoxetine (PROZAC) 40 MG capsule Take 40 mg by mouth daily.    . isosorbide mononitrate (IMDUR) 30 MG 24 hr tablet Take 0.5 tablets (15 mg total) by mouth daily. 45 tablet 4  . LEVEMIR 100 UNIT/ML injection Inject 50 Units into the skin at bedtime. 48 UNITS JUST BEFORE BED    . nitroGLYCERIN (NITROSTAT) 0.4 MG SL tablet Place 1 tablet (0.4 mg total) under the tongue every 5 (five) minutes x 3 doses as needed for chest pain. 25 tablet 12  . tamsulosin (FLOMAX) 0.4 MG CAPS capsule Take 1 capsule by mouth daily.    . valsartan (DIOVAN) 160 MG tablet  Take 1 tablet (160 mg total) by mouth daily. 90 tablet 3  . nitroGLYCERIN (NITROSTAT) 0.4 MG SL tablet Place 1 tablet (0.4 mg total) under the tongue every 5 (five) minutes x 3 doses as needed for chest pain. 25 tablet 3   No current facility-administered medications on file prior to visit.    Allergies  Allergen Reactions  . Metformin And Related Nausea Only and Other (See Comments)    Loss of appetite  . Lisinopril Cough    Past Medical History  Diagnosis Date  . Hypertension   . Hyperlipidemia   . GERD (gastroesophageal reflux disease)   . MVP (mitral valve prolapse)     s/p MV annuloplasty at [facility] 2004  . Ischemic cardiomyopathy     s/p St. [name] (Atlas) ICD implanted in [place] 2007  . Anxiety   . CAD (coronary artery disease)     a.  s/p Stent to LAD '00;  b.  PTCA 2nd diagonal '10;  c. 02/18/12 Cath: moderate nonobs dzs - med rx  . PAF (paroxysmal atrial fibrillation)     noted on ICD interrogation '10  . Episodic mood disorder   . Chronic ischemic heart disease   . Depression   . AICD (automatic cardioverter/defibrillator) present   . CHF (  congestive heart failure)   . Heart murmur   . Anginal pain   . Myocardial infarction ~ 1993    "cardiac arrest @ [employer] [facility]  . Myocardial infarct, old     "/EKGs"  . Type II diabetes mellitus     uncontrolled  . ED (erectile dysfunction)     Past Surgical History  Procedure Laterality Date  . Cardiac defibrillator placement  2007    implanted in [place], has a 7001 RV lead and a SJM Atlas ICD followed by Dr Caryl Comes  . Mitral valve annuloplasty  2004    Archie Endo 02/17/2012  . Coronary stent placement  ~ 2000  . Cardiac valve replacement    . Implantable cardioverter defibrillator generator change  03/19/13    SJM Fortify ST DR generator placed by Dr Lovena Le, part of Analyze ST study  . Left heart catheterization with coronary angiogram N/A 02/17/2012    Procedure: LEFT HEART CATHETERIZATION WITH CORONARY ANGIOGRAM;   Surgeon: Jolaine Artist, MD;  Location: [facility] CATH LAB;  Service: Cardiovascular;  Laterality: N/A;  . Implantable cardioverter defibrillator (icd) generator change N/A 03/19/2013    Procedure: ICD GENERATOR CHANGE;  Surgeon: Evans Lance, MD;  Location: [facility] CATH LAB;  Service: Cardiovascular;  Laterality: N/A;  . Cardiac catheterization  08/2009    diagnostic/notes 09/03/2009  . Coronary angioplasty  08/2009    Archie Endo 09/03/2009  . Coronary angioplasty with stent placement  01/14/2015    "2"  . Left and right heart catheterization with coronary angiogram N/A 01/14/2015    Procedure: LEFT AND RIGHT HEART CATHETERIZATION WITH CORONARY ANGIOGRAM;  Surgeon: Sherrell Weir M Martinique, MD;  Location: [facility] CATH LAB;  Service: Cardiovascular;  Laterality: N/A;    History  Smoking status  . Never Smoker   Smokeless tobacco  . Never Used    History  Alcohol Use  . 0.0 oz/week  . 0 Standard drinks or equivalent per week    Comment: 01/14/2015 "used to drink alot; stopped completely in 1983"    Family History  Problem Relation Age of Onset  . Coronary artery disease Mother   . Colon cancer Mother   . Heart attack Mother   . Heart disease Mother   . Heart failure Mother   . Hypertension Mother   . Malignant hyperthermia Mother   . Colon cancer Sister   . Coronary artery disease Brother   . Heart disease Brother     Review of Systems: As noted in history of present illness. All other systems are reviewed and are negative.  Physical Exam: BP 122/82 mmHg  Pulse 74  Ht 6' (1.829 m)  Wt 227 lb 11.2 oz (103.284 kg)  BMI 30.87 kg/m2 He is a pleasant overweight white male in no acute distress. He is normocephalic, atraumatic. Pupils are equal round and reactive to light and accommodation. Extraocular movements are full. Oropharynx is clear. Neck is supple without JVD, adenopathy, thyromegaly, or bruits. Lungs are clear. Cardiac exam reveals a regular rate and rhythm with a grade 2/6 holosystolic murmur heard  best at the apex radiating to the left axilla. There is no S3. His defibrillator site in the left subclavicular area appears normal. He has a healed median sternotomy scar.  Femoral and pedal pulses are 2+ and symmetric. He has no significant edema. Skin is warm and dry. He is alert and oriented x3. Cranial nerves II through XII are intact.  LABORATORY DATA: ICD check in Feb 2016  was satisfactory.  Assessment /  Plan: 1. Coronary disease status post stenting of the LAD and diagonal in the past. Recent stenting with DES of first diagonal and first OM. Clinically doing well. No angina. Continue DAPT.  2. History of congestive heart failure with chronic systolic dysfunction. Ejection fraction 30-35%. He is status post ICD implant. Normal right heart pressures on last cath. Continue ARB, Coreg, and aldactone.  3. History of paroxysmal atrial fibrillation by ICD check in 2010. No evidence of recurrence.  4. Hypertension. Blood pressure is well controlled.   6. Diabetes mellitus on insulin. Needs to be compliance with meds.   7. History of mitral valve prolapse status post mitral annuloplasty in 2004. Echo showed moderate MR. Mild by cath and right heart pressures normal. Continue medical therapy.  8. Hypercholesterolemia. Requested most recent lab work. Continue statin.  I will follow up in 6 months.

## 2015-05-16 ENCOUNTER — Telehealth: Payer: Self-pay

## 2015-05-16 MED ORDER — PANTOPRAZOLE SODIUM 40 MG PO TBEC: 40.0000 mg | DELAYED_RELEASE_TABLET | Freq: Every day | ORAL | Status: DC

## 2015-05-16 NOTE — Telephone Encounter (Signed)
Received a fax from [facility] stating omeprazole may lower effectiveness of clopidogrel.Dr.Jordan advised to stop omeprazole and start protonix 40 mg daily.Faxed back to fax # [phone].

## 2015-05-19 DIAGNOSIS — E785 Hyperlipidemia, unspecified: Secondary | ICD-10-CM | POA: Diagnosis not present

## 2015-05-19 DIAGNOSIS — E1165 Type 2 diabetes mellitus with hyperglycemia: Secondary | ICD-10-CM | POA: Diagnosis not present

## 2015-05-19 DIAGNOSIS — I10 Essential (primary) hypertension: Secondary | ICD-10-CM | POA: Diagnosis not present

## 2015-05-19 DIAGNOSIS — R252 Cramp and spasm: Secondary | ICD-10-CM | POA: Diagnosis not present

## 2015-07-31 ENCOUNTER — Telehealth: Payer: Self-pay | Admitting: *Deleted

## 2015-07-31 NOTE — Telephone Encounter (Signed)
Attempted to reach pt by phone, phone is out of service. Email sent to pt- no communication alert received on Merlin. Pt advised to reset Merlin. Tech Support number provided if assistance is needed.

## 2015-08-25 ENCOUNTER — Encounter: Payer: Commercial Managed Care - HMO | Admitting: Nurse Practitioner

## 2015-10-09 ENCOUNTER — Telehealth: Payer: Self-pay | Admitting: *Deleted

## 2015-10-09 NOTE — Telephone Encounter (Signed)
Multiple attempts made to reach pt. Left another message. Pt needs to have device checked in clinic with research ASAP.

## 2015-10-15 ENCOUNTER — Encounter: Payer: Self-pay | Admitting: *Deleted

## 2015-10-15 ENCOUNTER — Encounter: Payer: Self-pay | Admitting: Internal Medicine

## 2015-10-15 ENCOUNTER — Ambulatory Visit (INDEPENDENT_AMBULATORY_CARE_PROVIDER_SITE_OTHER): Payer: Commercial Managed Care - HMO | Admitting: Internal Medicine

## 2015-10-15 DIAGNOSIS — Z95810 Presence of automatic (implantable) cardiac defibrillator: Secondary | ICD-10-CM

## 2015-10-15 DIAGNOSIS — I519 Heart disease, unspecified: Secondary | ICD-10-CM | POA: Diagnosis not present

## 2015-10-15 DIAGNOSIS — Z006 Encounter for examination for normal comparison and control in clinical research program: Secondary | ICD-10-CM

## 2015-10-15 LAB — CUP PACEART INCLINIC DEVICE CHECK
Battery Remaining Longevity: 80.4
Brady Statistic RA Percent Paced: 1.2 %
Brady Statistic RV Percent Paced: 0 %
Date Time Interrogation Session: 20161102123225
HighPow Impedance: 51 Ohm
Implantable Lead Implant Date: 20070516
Implantable Lead Implant Date: 20070516
Implantable Lead Location: 753860
Implantable Lead Model: 7001
Lead Channel Impedance Value: 362.5 Ohm
Lead Channel Pacing Threshold Amplitude: 0.5 V
Lead Channel Pacing Threshold Amplitude: 0.5 V
Lead Channel Pacing Threshold Pulse Width: 0.5 ms
Lead Channel Pacing Threshold Pulse Width: 0.5 ms
Lead Channel Pacing Threshold Pulse Width: 0.5 ms
Lead Channel Sensing Intrinsic Amplitude: 11.9 mV
Lead Channel Sensing Intrinsic Amplitude: 3.5 mV
Lead Channel Setting Sensing Sensitivity: 0.5 mV
MDC IDC LEAD LOCATION: 753859
MDC IDC MSMT LEADCHNL RA IMPEDANCE VALUE: 437.5 Ohm
MDC IDC MSMT LEADCHNL RV PACING THRESHOLD AMPLITUDE: 1.25 V
MDC IDC MSMT LEADCHNL RV PACING THRESHOLD AMPLITUDE: 1.25 V
MDC IDC MSMT LEADCHNL RV PACING THRESHOLD PULSEWIDTH: 0.5 ms
MDC IDC SET LEADCHNL RA PACING AMPLITUDE: 2 V
MDC IDC SET LEADCHNL RV PACING AMPLITUDE: 2.5 V
MDC IDC SET LEADCHNL RV PACING PULSEWIDTH: 0.5 ms
Pulse Gen Serial Number: 1056971

## 2015-10-15 NOTE — Progress Notes (Signed)
  Analyze ST Final Research Visit- Pt seen in office with Dr. Rayann Heman. Device checked by industry and research. No ST alerts. No recent sustained arrhythmias. Research ST thresholds turned off per protocol. Pt released from research study [place] and transferred to [facility] for ongoing remote monitoring. Cell adapter ordered for pt.  EKG WNL reviewed by Dr. Rayann Heman. BP 111/71 HR 71.

## 2015-10-15 NOTE — Progress Notes (Signed)
PCP: Garwin Brothers, MD Primary Cardiologist:  Dr Martinique  [name] [name] is a 64 y.o. male who presents today for routine electrophysiology followup.  He has rare chest tightness.  Today, he denies symptoms of palpitations,  shortness of breath,  lower extremity edema, presyncope, syncope, or ICD shocks.  The patient is otherwise without complaint today.   Past Medical History  Diagnosis Date  . Hypertension   . Hyperlipidemia   . GERD (gastroesophageal reflux disease)   . MVP (mitral valve prolapse)     s/p MV annuloplasty at [facility] 2004  . Ischemic cardiomyopathy     s/p St. [name] (Atlas) ICD implanted in [place] 2007  . Anxiety   . CAD (coronary artery disease)     a.  s/p Stent to LAD '00;  b.  PTCA 2nd diagonal '10;  c. 02/18/12 Cath: moderate nonobs dzs - med rx  . PAF (paroxysmal atrial fibrillation)     noted on ICD interrogation '10  . Episodic mood disorder   . Chronic ischemic heart disease   . Depression   . AICD (automatic cardioverter/defibrillator) present   . CHF (congestive heart failure)   . Heart murmur   . Anginal pain   . Myocardial infarction [facility]) ~ 1993    "cardiac arrest @ [facility]  . Myocardial infarct, old     "/EKGs"  . Type II diabetes mellitus     uncontrolled  . ED (erectile dysfunction)    Past Surgical History  Procedure Laterality Date  . Cardiac defibrillator placement  2007    implanted in [place], has a 7001 RV lead and a SJM Atlas ICD followed by Dr Caryl Comes  . Mitral valve annuloplasty  2004    Archie Endo 02/17/2012  . Coronary stent placement  ~ 2000  . Cardiac valve replacement    . Implantable cardioverter defibrillator generator change  03/19/13    SJM Fortify ST DR generator placed by Dr Lovena Le, part of Analyze ST study  . Left heart catheterization with coronary angiogram N/A 02/17/2012    Procedure: LEFT HEART CATHETERIZATION WITH CORONARY ANGIOGRAM;  Surgeon: Jolaine Artist, MD;  Location: [facility] CATH LAB;  Service:  Cardiovascular;  Laterality: N/A;  . Implantable cardioverter defibrillator (icd) generator change N/A 03/19/2013    Procedure: ICD GENERATOR CHANGE;  Surgeon: Evans Lance, MD;  Location: [facility] CATH LAB;  Service: Cardiovascular;  Laterality: N/A;  . Cardiac catheterization  08/2009    diagnostic/notes 09/03/2009  . Coronary angioplasty  08/2009    Archie Endo 09/03/2009  . Coronary angioplasty with stent placement  01/14/2015    "2"  . Left and right heart catheterization with coronary angiogram N/A 01/14/2015    Procedure: LEFT AND RIGHT HEART CATHETERIZATION WITH CORONARY ANGIOGRAM;  Surgeon: Peter M Martinique, MD;  Location: [facility] CATH LAB;  Service: Cardiovascular;  Laterality: N/A;    Current Outpatient Prescriptions  Medication Sig Dispense Refill  . aspirin EC 81 MG tablet Take 81 mg by mouth daily.    Marland Kitchen atorvastatin (LIPITOR) 20 MG tablet Take 20 mg by mouth daily.      . carvedilol (COREG) 25 MG tablet Take 1 tablet (25 mg total) by mouth 2 (two) times daily with a meal. 180 tablet 4  . clopidogrel (PLAVIX) 75 MG tablet Take 1 tablet (75 mg total) by mouth daily. 90 tablet 3  . FLUoxetine (PROZAC) 40 MG capsule Take 40 mg by mouth daily.    . isosorbide mononitrate (IMDUR) 30 MG 24 hr  tablet Take 0.5 tablets (15 mg total) by mouth daily. 45 tablet 4  . LEVEMIR 100 UNIT/ML injection Inject 50 Units into the skin at bedtime. 48 UNITS JUST BEFORE BED    . nitroGLYCERIN (NITROSTAT) 0.4 MG SL tablet Place 1 tablet (0.4 mg total) under the tongue every 5 (five) minutes x 3 doses as needed for chest pain. 25 tablet 3  . nitroGLYCERIN (NITROSTAT) 0.4 MG SL tablet Place 1 tablet (0.4 mg total) under the tongue every 5 (five) minutes x 3 doses as needed for chest pain. 25 tablet 12  . pantoprazole (PROTONIX) 40 MG tablet Take 1 tablet (40 mg total) by mouth daily. 90 tablet 3  . spironolactone (ALDACTONE) 25 MG tablet Take 1 tablet (25 mg total) by mouth daily. 90 tablet 3  . tamsulosin (FLOMAX) 0.4 MG CAPS  capsule Take 1 capsule by mouth daily.    . valsartan (DIOVAN) 160 MG tablet Take 1 tablet (160 mg total) by mouth daily. 90 tablet 3   No current facility-administered medications for this visit.    Physical Exam: Vitals reviewed with research nurse.   GEN- The patient is well appearing, alert and oriented x 3 today.   Head- normocephalic, atraumatic Eyes-  Sclera clear, conjunctiva pink Ears- hearing intact Oropharynx- clear Lungs- Clear to ausculation bilaterally, normal work of breathing Chest- ICD pocket is well healed Heart- Regular rate and rhythm, no murmurs, rubs or gallops, PMI not laterally displaced GI- soft, NT, ND, + BS Extremities- no clubbing, cyanosis, or edema  ICD interrogation- reviewed in detail today,  See PACEART report  Assessment and Plan:  1.  Chronic systolic dysfunction euvolemic today Normal ICD function See Pace Art report No changes today  I have discussed SJM Fortify Assura advisary with the patient today. He understands that recommendation from SJM is to not replace the device at this time. The patient is not device dependant.  The patient has not had appropriate device therapy in the past or implanted for secondary prevention.  Vibratory alert demonstrated today.  He is actively remotely monitored and understands the importance of compliance today.   2. Hypertension Stable No change required today  Follow-up with Dr Martinique encouraged  Merlin Return to see EP NP in 1 year  Thompson Grayer MD, [facility] 10/15/2015 4:40 PM

## 2015-10-16 NOTE — Addendum Note (Signed)
Addended by: Freada Bergeron on: 10/16/2015 05:47 PM   Modules accepted: Orders

## 2015-10-17 DIAGNOSIS — I429 Cardiomyopathy, unspecified: Secondary | ICD-10-CM | POA: Diagnosis not present

## 2015-10-17 DIAGNOSIS — I2510 Atherosclerotic heart disease of native coronary artery without angina pectoris: Secondary | ICD-10-CM | POA: Diagnosis not present

## 2015-10-17 DIAGNOSIS — Z23 Encounter for immunization: Secondary | ICD-10-CM | POA: Diagnosis not present

## 2015-10-17 DIAGNOSIS — E785 Hyperlipidemia, unspecified: Secondary | ICD-10-CM | POA: Diagnosis not present

## 2015-10-17 DIAGNOSIS — E1165 Type 2 diabetes mellitus with hyperglycemia: Secondary | ICD-10-CM | POA: Diagnosis not present

## 2015-10-17 DIAGNOSIS — I10 Essential (primary) hypertension: Secondary | ICD-10-CM | POA: Diagnosis not present

## 2015-10-23 ENCOUNTER — Telehealth: Payer: Self-pay | Admitting: Cardiology

## 2015-10-23 NOTE — Telephone Encounter (Signed)
[name] [name] , Is taking Isosobride and its making him extremely dizzy and Lightheaded . Please call   Thanks

## 2015-10-23 NOTE — Telephone Encounter (Signed)
Returned call to patient.He stated about 1 hour after taking isosorbide 30 mg 1/2 tablet he gets dizzy.Stated dizziness last about 2 to 3 hrs.B/P has been good 111/72.Advised Dr.Jordan out of office this afternoon.I will let him know 10/24/15 and call you back.

## 2015-10-24 NOTE — Telephone Encounter (Signed)
Returned call to patient.No answer.Left message on personal voice mail.Spoke to [place] he advised ok to stop isosorbide.Advised to call back if continues to have dizziness.

## 2015-10-29 ENCOUNTER — Telehealth: Payer: Self-pay

## 2015-10-29 NOTE — Telephone Encounter (Signed)
Received call from patient.He stated since he stopped Isosorbide his dizziness is a little better.Stated his sugar is elevated at 500.Stated PCP has changed diabetic medication.Stated he will call back if he continues to be dizzy.

## 2015-11-17 DIAGNOSIS — E1165 Type 2 diabetes mellitus with hyperglycemia: Secondary | ICD-10-CM | POA: Diagnosis not present

## 2015-11-17 DIAGNOSIS — E785 Hyperlipidemia, unspecified: Secondary | ICD-10-CM | POA: Diagnosis not present

## 2015-11-17 DIAGNOSIS — I429 Cardiomyopathy, unspecified: Secondary | ICD-10-CM | POA: Diagnosis not present

## 2015-11-17 DIAGNOSIS — R42 Dizziness and giddiness: Secondary | ICD-10-CM | POA: Diagnosis not present

## 2015-11-17 DIAGNOSIS — I10 Essential (primary) hypertension: Secondary | ICD-10-CM | POA: Diagnosis not present

## 2015-11-17 DIAGNOSIS — I2510 Atherosclerotic heart disease of native coronary artery without angina pectoris: Secondary | ICD-10-CM | POA: Diagnosis not present

## 2015-12-31 ENCOUNTER — Encounter: Payer: Commercial Managed Care - HMO | Admitting: Internal Medicine

## 2016-01-06 ENCOUNTER — Ambulatory Visit: Payer: Commercial Managed Care - HMO | Admitting: Cardiology

## 2016-01-14 ENCOUNTER — Ambulatory Visit (INDEPENDENT_AMBULATORY_CARE_PROVIDER_SITE_OTHER): Payer: Commercial Managed Care - HMO | Admitting: *Deleted

## 2016-01-14 DIAGNOSIS — I255 Ischemic cardiomyopathy: Secondary | ICD-10-CM

## 2016-01-14 NOTE — Progress Notes (Signed)
Remote ICD transmission.   

## 2016-02-03 LAB — CUP PACEART REMOTE DEVICE CHECK
Battery Remaining Percentage: 66 %
Battery Voltage: 2.96 V
Brady Statistic AP VP Percent: 1 %
Brady Statistic AS VS Percent: 97 %
Brady Statistic RA Percent Paced: 1 %
Brady Statistic RV Percent Paced: 1 %
Date Time Interrogation Session: 20170201083328
HIGH POWER IMPEDANCE MEASURED VALUE: 46 Ohm
Implantable Lead Implant Date: 20070516
Implantable Lead Implant Date: 20070516
Implantable Lead Model: 7001
Lead Channel Impedance Value: 350 Ohm
Lead Channel Impedance Value: 410 Ohm
Lead Channel Pacing Threshold Amplitude: 1.25 V
Lead Channel Pacing Threshold Pulse Width: 0.5 ms
Lead Channel Sensing Intrinsic Amplitude: 11.9 mV
Lead Channel Sensing Intrinsic Amplitude: 2.3 mV
Lead Channel Setting Pacing Amplitude: 2 V
Lead Channel Setting Pacing Pulse Width: 0.5 ms
Lead Channel Setting Sensing Sensitivity: 0.5 mV
MDC IDC LEAD LOCATION: 753859
MDC IDC LEAD LOCATION: 753860
MDC IDC MSMT BATTERY REMAINING LONGEVITY: 71 mo
MDC IDC MSMT LEADCHNL RA PACING THRESHOLD AMPLITUDE: 0.5 V
MDC IDC MSMT LEADCHNL RV PACING THRESHOLD PULSEWIDTH: 0.5 ms
MDC IDC SET LEADCHNL RV PACING AMPLITUDE: 2.5 V
MDC IDC STAT BRADY AP VS PERCENT: 1.8 %
MDC IDC STAT BRADY AS VP PERCENT: 1 %
Pulse Gen Serial Number: 1056971

## 2016-02-06 ENCOUNTER — Encounter: Payer: Self-pay | Admitting: Cardiology

## 2016-02-20 ENCOUNTER — Encounter: Payer: Self-pay | Admitting: Cardiology

## 2016-03-11 ENCOUNTER — Telehealth: Payer: Self-pay | Admitting: Cardiology

## 2016-03-11 NOTE — Telephone Encounter (Signed)
Spoke w/ pt and requested that he send a manual transmission using his home monitor b/c it has not updated in at least 8 days. Pt verbalized understanding.

## 2016-04-14 ENCOUNTER — Ambulatory Visit (INDEPENDENT_AMBULATORY_CARE_PROVIDER_SITE_OTHER): Payer: Commercial Managed Care - HMO | Admitting: *Deleted

## 2016-04-14 DIAGNOSIS — Z95810 Presence of automatic (implantable) cardiac defibrillator: Secondary | ICD-10-CM

## 2016-04-14 DIAGNOSIS — I255 Ischemic cardiomyopathy: Secondary | ICD-10-CM

## 2016-04-15 NOTE — Progress Notes (Signed)
Remote ICD transmission.   

## 2016-05-21 ENCOUNTER — Encounter: Payer: Self-pay | Admitting: Cardiology

## 2016-05-25 LAB — CUP PACEART REMOTE DEVICE CHECK
Battery Remaining Percentage: 64 %
Battery Voltage: 2.96 V
Brady Statistic AS VS Percent: 98 %
Brady Statistic RA Percent Paced: 1 %
Brady Statistic RV Percent Paced: 1 %
HighPow Impedance: 46 Ohm
Implantable Lead Implant Date: 20070516
Implantable Lead Implant Date: 20070516
Implantable Lead Location: 753859
Implantable Lead Location: 753860
Implantable Lead Model: 7001
Lead Channel Impedance Value: 410 Ohm
Lead Channel Pacing Threshold Amplitude: 0.5 V
Lead Channel Pacing Threshold Pulse Width: 0.5 ms
Lead Channel Pacing Threshold Pulse Width: 0.5 ms
Lead Channel Setting Pacing Amplitude: 2 V
Lead Channel Setting Pacing Amplitude: 2.5 V
Lead Channel Setting Pacing Pulse Width: 0.5 ms
MDC IDC MSMT BATTERY REMAINING LONGEVITY: 68 mo
MDC IDC MSMT LEADCHNL RA SENSING INTR AMPL: 3.1 mV
MDC IDC MSMT LEADCHNL RV IMPEDANCE VALUE: 350 Ohm
MDC IDC MSMT LEADCHNL RV PACING THRESHOLD AMPLITUDE: 1.25 V
MDC IDC MSMT LEADCHNL RV SENSING INTR AMPL: 11.9 mV
MDC IDC SESS DTM: 20170503060017
MDC IDC SET LEADCHNL RV SENSING SENSITIVITY: 0.5 mV
MDC IDC STAT BRADY AP VP PERCENT: 1 %
MDC IDC STAT BRADY AP VS PERCENT: 1.6 %
MDC IDC STAT BRADY AS VP PERCENT: 1 %
Pulse Gen Serial Number: 1056971

## 2016-06-04 ENCOUNTER — Encounter: Payer: Self-pay | Admitting: Cardiology

## 2016-07-14 ENCOUNTER — Ambulatory Visit (INDEPENDENT_AMBULATORY_CARE_PROVIDER_SITE_OTHER): Payer: Commercial Managed Care - HMO | Admitting: *Deleted

## 2016-07-14 DIAGNOSIS — I255 Ischemic cardiomyopathy: Secondary | ICD-10-CM

## 2016-07-14 DIAGNOSIS — Z95810 Presence of automatic (implantable) cardiac defibrillator: Secondary | ICD-10-CM

## 2016-07-14 NOTE — Progress Notes (Signed)
Remote ICD transmission.   

## 2016-07-19 DIAGNOSIS — E1165 Type 2 diabetes mellitus with hyperglycemia: Secondary | ICD-10-CM | POA: Diagnosis not present

## 2016-07-19 DIAGNOSIS — I10 Essential (primary) hypertension: Secondary | ICD-10-CM | POA: Diagnosis not present

## 2016-07-19 DIAGNOSIS — Z6830 Body mass index (BMI) 30.0-30.9, adult: Secondary | ICD-10-CM | POA: Diagnosis not present

## 2016-07-19 DIAGNOSIS — E785 Hyperlipidemia, unspecified: Secondary | ICD-10-CM | POA: Diagnosis not present

## 2016-07-19 DIAGNOSIS — E669 Obesity, unspecified: Secondary | ICD-10-CM | POA: Diagnosis not present

## 2016-07-21 ENCOUNTER — Encounter: Payer: Self-pay | Admitting: Cardiology

## 2016-08-03 LAB — CUP PACEART REMOTE DEVICE CHECK
Battery Remaining Longevity: 67 mo
Battery Remaining Percentage: 62 %
Battery Voltage: 2.95 V
Brady Statistic AP VP Percent: 1 %
Brady Statistic AS VP Percent: 1 %
Brady Statistic RA Percent Paced: 1 %
Date Time Interrogation Session: 20170802073405
HighPow Impedance: 49 Ohm
Implantable Lead Implant Date: 20070516
Implantable Lead Location: 753860
Implantable Lead Model: 7001
Lead Channel Impedance Value: 430 Ohm
Lead Channel Pacing Threshold Amplitude: 0.5 V
Lead Channel Pacing Threshold Pulse Width: 0.5 ms
Lead Channel Setting Sensing Sensitivity: 0.5 mV
MDC IDC LEAD IMPLANT DT: 20070516
MDC IDC LEAD LOCATION: 753859
MDC IDC MSMT LEADCHNL RA SENSING INTR AMPL: 2.5 mV
MDC IDC MSMT LEADCHNL RV IMPEDANCE VALUE: 350 Ohm
MDC IDC MSMT LEADCHNL RV PACING THRESHOLD AMPLITUDE: 1.25 V
MDC IDC MSMT LEADCHNL RV PACING THRESHOLD PULSEWIDTH: 0.5 ms
MDC IDC MSMT LEADCHNL RV SENSING INTR AMPL: 11.9 mV
MDC IDC SET LEADCHNL RA PACING AMPLITUDE: 2 V
MDC IDC SET LEADCHNL RV PACING AMPLITUDE: 2.5 V
MDC IDC SET LEADCHNL RV PACING PULSEWIDTH: 0.5 ms
MDC IDC STAT BRADY AP VS PERCENT: 1.6 %
MDC IDC STAT BRADY AS VS PERCENT: 98 %
MDC IDC STAT BRADY RV PERCENT PACED: 1 %
Pulse Gen Serial Number: 1056971

## 2016-08-06 ENCOUNTER — Encounter: Payer: Self-pay | Admitting: Cardiology

## 2016-08-06 NOTE — Progress Notes (Signed)
Letter  

## 2016-09-06 ENCOUNTER — Telehealth: Payer: Self-pay | Admitting: Cardiology

## 2016-09-06 NOTE — Telephone Encounter (Signed)
°  New Prob   Wife states pt has been c/o of ongoing dizziness x 3 hours. Pt also reports sweating. Requesting to speak to RN as wife feels this may be related to his medication. Please call.

## 2016-09-06 NOTE — Telephone Encounter (Signed)
Returned call to patient's wife.She stated husband has been dizzy,perspiring heavy,sob,weakness.No chest pain. No edema.Weight stable.Stated his blood sugar ok at 126.Stated he is feeling better at present.Stated she would like appointment.Appointment scheduled with Kerin Ransom PA tomorrow 09/07/16 at 2:00 pm.Advised to go to ER if needed.

## 2016-09-07 ENCOUNTER — Ambulatory Visit: Payer: Commercial Managed Care - HMO | Admitting: Cardiology

## 2016-09-07 ENCOUNTER — Encounter: Payer: Self-pay | Admitting: *Deleted

## 2016-09-14 DIAGNOSIS — R799 Abnormal finding of blood chemistry, unspecified: Secondary | ICD-10-CM | POA: Diagnosis not present

## 2016-09-14 DIAGNOSIS — E1165 Type 2 diabetes mellitus with hyperglycemia: Secondary | ICD-10-CM | POA: Diagnosis not present

## 2016-09-27 ENCOUNTER — Emergency Department (HOSPITAL_COMMUNITY): Payer: Commercial Managed Care - HMO

## 2016-09-27 ENCOUNTER — Telehealth: Payer: Self-pay | Admitting: Internal Medicine

## 2016-09-27 ENCOUNTER — Inpatient Hospital Stay (HOSPITAL_COMMUNITY)
Admission: EM | Admit: 2016-09-27 | Discharge: 2016-09-30 | DRG: 309 | Disposition: A | Discharge status: Home / Self Care | Payer: Commercial Managed Care - HMO | Attending: Internal Medicine | Admitting: Internal Medicine

## 2016-09-27 ENCOUNTER — Encounter (HOSPITAL_COMMUNITY): Payer: Self-pay | Admitting: Emergency Medicine

## 2016-09-27 DIAGNOSIS — E1165 Type 2 diabetes mellitus with hyperglycemia: Secondary | ICD-10-CM | POA: Diagnosis not present

## 2016-09-27 DIAGNOSIS — I252 Old myocardial infarction: Secondary | ICD-10-CM

## 2016-09-27 DIAGNOSIS — N179 Acute kidney failure, unspecified: Secondary | ICD-10-CM | POA: Diagnosis present

## 2016-09-27 DIAGNOSIS — I480 Paroxysmal atrial fibrillation: Principal | ICD-10-CM | POA: Diagnosis present

## 2016-09-27 DIAGNOSIS — R42 Dizziness and giddiness: Secondary | ICD-10-CM

## 2016-09-27 DIAGNOSIS — E119 Type 2 diabetes mellitus without complications: Secondary | ICD-10-CM | POA: Diagnosis not present

## 2016-09-27 DIAGNOSIS — I10 Essential (primary) hypertension: Secondary | ICD-10-CM | POA: Diagnosis present

## 2016-09-27 DIAGNOSIS — I2510 Atherosclerotic heart disease of native coronary artery without angina pectoris: Secondary | ICD-10-CM | POA: Diagnosis not present

## 2016-09-27 DIAGNOSIS — Z955 Presence of coronary angioplasty implant and graft: Secondary | ICD-10-CM

## 2016-09-27 DIAGNOSIS — K219 Gastro-esophageal reflux disease without esophagitis: Secondary | ICD-10-CM | POA: Diagnosis not present

## 2016-09-27 DIAGNOSIS — Z952 Presence of prosthetic heart valve: Secondary | ICD-10-CM

## 2016-09-27 DIAGNOSIS — E785 Hyperlipidemia, unspecified: Secondary | ICD-10-CM | POA: Diagnosis present

## 2016-09-27 DIAGNOSIS — I5022 Chronic systolic (congestive) heart failure: Secondary | ICD-10-CM | POA: Diagnosis not present

## 2016-09-27 DIAGNOSIS — R0789 Other chest pain: Secondary | ICD-10-CM

## 2016-09-27 DIAGNOSIS — S199XXA Unspecified injury of neck, initial encounter: Secondary | ICD-10-CM | POA: Diagnosis not present

## 2016-09-27 DIAGNOSIS — F329 Major depressive disorder, single episode, unspecified: Secondary | ICD-10-CM | POA: Diagnosis not present

## 2016-09-27 DIAGNOSIS — Z7982 Long term (current) use of aspirin: Secondary | ICD-10-CM

## 2016-09-27 DIAGNOSIS — Z8249 Family history of ischemic heart disease and other diseases of the circulatory system: Secondary | ICD-10-CM | POA: Diagnosis not present

## 2016-09-27 DIAGNOSIS — I4892 Unspecified atrial flutter: Secondary | ICD-10-CM

## 2016-09-27 DIAGNOSIS — I484 Atypical atrial flutter: Secondary | ICD-10-CM | POA: Diagnosis present

## 2016-09-27 DIAGNOSIS — I129 Hypertensive chronic kidney disease with stage 1 through stage 4 chronic kidney disease, or unspecified chronic kidney disease: Secondary | ICD-10-CM | POA: Diagnosis not present

## 2016-09-27 DIAGNOSIS — I340 Nonrheumatic mitral (valve) insufficiency: Secondary | ICD-10-CM | POA: Diagnosis not present

## 2016-09-27 DIAGNOSIS — I4891 Unspecified atrial fibrillation: Secondary | ICD-10-CM | POA: Diagnosis not present

## 2016-09-27 DIAGNOSIS — I130 Hypertensive heart and chronic kidney disease with heart failure and stage 1 through stage 4 chronic kidney disease, or unspecified chronic kidney disease: Secondary | ICD-10-CM | POA: Diagnosis not present

## 2016-09-27 DIAGNOSIS — N50819 Testicular pain, unspecified: Secondary | ICD-10-CM | POA: Diagnosis not present

## 2016-09-27 DIAGNOSIS — R51 Headache: Secondary | ICD-10-CM | POA: Diagnosis not present

## 2016-09-27 DIAGNOSIS — Z7902 Long term (current) use of antithrombotics/antiplatelets: Secondary | ICD-10-CM

## 2016-09-27 DIAGNOSIS — Z95810 Presence of automatic (implantable) cardiac defibrillator: Secondary | ICD-10-CM | POA: Diagnosis not present

## 2016-09-27 DIAGNOSIS — R739 Hyperglycemia, unspecified: Secondary | ICD-10-CM | POA: Diagnosis not present

## 2016-09-27 DIAGNOSIS — Z794 Long term (current) use of insulin: Secondary | ICD-10-CM | POA: Diagnosis not present

## 2016-09-27 DIAGNOSIS — S0990XA Unspecified injury of head, initial encounter: Secondary | ICD-10-CM | POA: Diagnosis not present

## 2016-09-27 DIAGNOSIS — I255 Ischemic cardiomyopathy: Secondary | ICD-10-CM

## 2016-09-27 DIAGNOSIS — R0602 Shortness of breath: Secondary | ICD-10-CM

## 2016-09-27 DIAGNOSIS — R079 Chest pain, unspecified: Secondary | ICD-10-CM | POA: Diagnosis not present

## 2016-09-27 DIAGNOSIS — N183 Chronic kidney disease, stage 3 (moderate): Secondary | ICD-10-CM | POA: Diagnosis present

## 2016-09-27 DIAGNOSIS — R52 Pain, unspecified: Secondary | ICD-10-CM

## 2016-09-27 DIAGNOSIS — I248 Other forms of acute ischemic heart disease: Secondary | ICD-10-CM | POA: Diagnosis not present

## 2016-09-27 DIAGNOSIS — I5042 Chronic combined systolic (congestive) and diastolic (congestive) heart failure: Secondary | ICD-10-CM | POA: Diagnosis present

## 2016-09-27 DIAGNOSIS — Z23 Encounter for immunization: Secondary | ICD-10-CM

## 2016-09-27 DIAGNOSIS — N400 Enlarged prostate without lower urinary tract symptoms: Secondary | ICD-10-CM | POA: Diagnosis present

## 2016-09-27 DIAGNOSIS — E1122 Type 2 diabetes mellitus with diabetic chronic kidney disease: Secondary | ICD-10-CM | POA: Diagnosis present

## 2016-09-27 DIAGNOSIS — I861 Scrotal varices: Secondary | ICD-10-CM | POA: Diagnosis not present

## 2016-09-27 DIAGNOSIS — M542 Cervicalgia: Secondary | ICD-10-CM | POA: Diagnosis not present

## 2016-09-27 HISTORY — DX: Chronic kidney disease, stage 3 unspecified: N18.30

## 2016-09-27 HISTORY — DX: Dizziness and giddiness: R42

## 2016-09-27 HISTORY — DX: Chronic kidney disease, stage 3 (moderate): N18.3

## 2016-09-27 HISTORY — DX: Chronic combined systolic (congestive) and diastolic (congestive) heart failure: I50.42

## 2016-09-27 LAB — BASIC METABOLIC PANEL
ANION GAP: 14 (ref 5–15)
Anion gap: 7 (ref 5–15)
BUN: 23 mg/dL — ABNORMAL HIGH (ref 6–20)
BUN: 25 mg/dL — AB (ref 6–20)
CALCIUM: 9.3 mg/dL (ref 8.9–10.3)
CO2: 20 mmol/L — AB (ref 22–32)
CO2: 22 mmol/L (ref 22–32)
CREATININE: 1.69 mg/dL — AB (ref 0.61–1.24)
CREATININE: 1.67 mg/dL — AB (ref 0.61–1.24)
Calcium: 8.6 mg/dL — ABNORMAL LOW (ref 8.9–10.3)
Chloride: 98 mmol/L — ABNORMAL LOW (ref 101–111)
Chloride: 103 mmol/L (ref 101–111)
GFR calc Af Amer: 47 mL/min — ABNORMAL LOW (ref >60)
GFR calc Af Amer: 47 mL/min — ABNORMAL LOW (ref >60)
GFR calc non Af Amer: 41 mL/min — ABNORMAL LOW (ref >60)
GFR, EST NON AFRICAN AMERICAN: 40 mL/min — AB (ref >60)
GLUCOSE: 521 mg/dL — AB (ref 65–99)
Glucose, Bld: 462 mg/dL — ABNORMAL HIGH (ref 65–99)
POTASSIUM: 4.4 mmol/L (ref 3.5–5.1)
Potassium: 4.5 mmol/L (ref 3.5–5.1)
SODIUM: 132 mmol/L — AB (ref 135–145)
Sodium: 132 mmol/L — ABNORMAL LOW (ref 135–145)

## 2016-09-27 LAB — GLUCOSE, CAPILLARY
GLUCOSE-CAPILLARY: 316 mg/dL — AB (ref 65–99)
Glucose-Capillary: 344 mg/dL — ABNORMAL HIGH (ref 65–99)

## 2016-09-27 LAB — TSH: TSH: 2.313 u[IU]/mL (ref 0.350–4.500)

## 2016-09-27 LAB — CBC
HCT: 43.4 % (ref 39.0–52.0)
HEMATOCRIT: 41 % (ref 39.0–52.0)
HEMOGLOBIN: 15.7 g/dL (ref 13.0–17.0)
HEMOGLOBIN: 14.7 g/dL (ref 13.0–17.0)
MCH: 30.8 pg (ref 26.0–34.0)
MCH: 30.6 pg (ref 26.0–34.0)
MCHC: 36.2 g/dL — AB (ref 30.0–36.0)
MCHC: 35.9 g/dL (ref 30.0–36.0)
MCV: 85.3 fL (ref 78.0–100.0)
MCV: 85.2 fL (ref 78.0–100.0)
Platelets: 256 10*3/uL (ref 150–400)
Platelets: 222 10*3/uL (ref 150–400)
RBC: 5.09 MIL/uL (ref 4.22–5.81)
RBC: 4.81 MIL/uL (ref 4.22–5.81)
RDW: 12.7 % (ref 11.5–15.5)
RDW: 12.5 % (ref 11.5–15.5)
WBC: 7.8 10*3/uL (ref 4.0–10.5)
WBC: 8.3 10*3/uL (ref 4.0–10.5)

## 2016-09-27 LAB — HEPATIC FUNCTION PANEL
ALBUMIN: 4.1 g/dL (ref 3.5–5.0)
ALK PHOS: 90 U/L (ref 38–126)
ALT: 24 U/L (ref 17–63)
AST: 22 U/L (ref 15–41)
BILIRUBIN TOTAL: 1.2 mg/dL (ref 0.3–1.2)
Bilirubin, Direct: 0.2 mg/dL (ref 0.1–0.5)
Indirect Bilirubin: 1 mg/dL — ABNORMAL HIGH (ref 0.3–0.9)
TOTAL PROTEIN: 7 g/dL (ref 6.5–8.1)

## 2016-09-27 LAB — PROTIME-INR
INR: 1.07
PROTHROMBIN TIME: 13.9 s (ref 11.4–15.2)

## 2016-09-27 LAB — I-STAT TROPONIN, ED: TROPONIN I, POC: 0.03 ng/mL (ref 0.00–0.08)

## 2016-09-27 LAB — BRAIN NATRIURETIC PEPTIDE: B Natriuretic Peptide: 284.3 pg/mL — ABNORMAL HIGH (ref 0.0–100.0)

## 2016-09-27 LAB — I-STAT CG4 LACTIC ACID, ED
LACTIC ACID, VENOUS: 1.85 mmol/L (ref 0.5–1.9)
Lactic Acid, Venous: 2.29 mmol/L (ref 0.5–1.9)

## 2016-09-27 LAB — MAGNESIUM: Magnesium: 2 mg/dL (ref 1.7–2.4)

## 2016-09-27 LAB — T4, FREE: Free T4: 1.15 ng/dL — ABNORMAL HIGH (ref 0.61–1.12)

## 2016-09-27 LAB — TROPONIN I: TROPONIN I: 0.03 ng/mL — AB (ref <0.03)

## 2016-09-27 MED ORDER — METOPROLOL TARTRATE 25 MG PO TABS: 25.0000 mg | ORAL_TABLET | Freq: Two times a day (BID) | ORAL | Status: DC

## 2016-09-27 MED ORDER — WARFARIN VIDEO
Freq: Once | Status: AC
  Administered 2016-09-27: 19:00:00

## 2016-09-27 MED ORDER — INSULIN ASPART 100 UNIT/ML ~~LOC~~ SOLN
0.0000 [IU] | Freq: Every day | SUBCUTANEOUS | Status: DC
  Administered 2016-09-27: 4 [IU] via SUBCUTANEOUS

## 2016-09-27 MED ORDER — WARFARIN SODIUM 7.5 MG PO TABS
7.5000 mg | ORAL_TABLET | Freq: Once | ORAL | Status: AC
  Administered 2016-09-27: 7.5 mg via ORAL
  Filled 2016-09-27: qty 1

## 2016-09-27 MED ORDER — METOPROLOL TARTRATE 25 MG PO TABS
25.0000 mg | ORAL_TABLET | Freq: Two times a day (BID) | ORAL | Status: DC
  Administered 2016-09-27: 25 mg via ORAL
  Filled 2016-09-27: qty 1

## 2016-09-27 MED ORDER — PANTOPRAZOLE SODIUM 40 MG PO TBEC
40.0000 mg | DELAYED_RELEASE_TABLET | Freq: Every day | ORAL | Status: DC
  Administered 2016-09-27 – 2016-09-30 (×3): 40 mg via ORAL
  Filled 2016-09-27 (×3): qty 1

## 2016-09-27 MED ORDER — METOPROLOL TARTRATE 5 MG/5ML IV SOLN
2.5000 mg | Freq: Once | INTRAVENOUS | Status: AC
  Administered 2016-09-27: 2.5 mg via INTRAVENOUS

## 2016-09-27 MED ORDER — ATORVASTATIN CALCIUM 10 MG PO TABS
10.0000 mg | ORAL_TABLET | Freq: Every day | ORAL | Status: DC
  Administered 2016-09-27 – 2016-09-30 (×3): 10 mg via ORAL
  Filled 2016-09-27 (×4): qty 1

## 2016-09-27 MED ORDER — METOPROLOL TARTRATE 5 MG/5ML IV SOLN
2.5000 mg | INTRAVENOUS | Status: DC | PRN
  Administered 2016-09-27: 2.5 mg via INTRAVENOUS
  Filled 2016-09-27 (×4): qty 5

## 2016-09-27 MED ORDER — SODIUM CHLORIDE 0.9 % IV BOLUS (SEPSIS)
1000.0000 mL | Freq: Once | INTRAVENOUS | Status: AC
  Administered 2016-09-27: 1000 mL via INTRAVENOUS

## 2016-09-27 MED ORDER — ASPIRIN 81 MG PO CHEW
324.0000 mg | CHEWABLE_TABLET | Freq: Once | ORAL | Status: AC
  Administered 2016-09-27: 324 mg via ORAL
  Filled 2016-09-27: qty 4

## 2016-09-27 MED ORDER — ACETAMINOPHEN 325 MG PO TABS: 650.0000 mg | ORAL_TABLET | ORAL | Status: DC | PRN

## 2016-09-27 MED ORDER — TAMSULOSIN HCL 0.4 MG PO CAPS
0.4000 mg | ORAL_CAPSULE | Freq: Every day | ORAL | Status: DC
  Administered 2016-09-28 – 2016-09-30 (×2): 0.4 mg via ORAL
  Filled 2016-09-27 (×2): qty 1

## 2016-09-27 MED ORDER — ASPIRIN EC 81 MG PO TBEC
81.0000 mg | DELAYED_RELEASE_TABLET | Freq: Every day | ORAL | Status: DC
  Administered 2016-09-27 – 2016-09-30 (×4): 81 mg via ORAL
  Filled 2016-09-27 (×4): qty 1

## 2016-09-27 MED ORDER — LINAGLIPTIN 5 MG PO TABS
5.0000 mg | ORAL_TABLET | Freq: Every day | ORAL | Status: DC
  Administered 2016-09-27 – 2016-09-30 (×4): 5 mg via ORAL
  Filled 2016-09-27 (×4): qty 1

## 2016-09-27 MED ORDER — CLOPIDOGREL BISULFATE 75 MG PO TABS
75.0000 mg | ORAL_TABLET | Freq: Every day | ORAL | Status: DC
  Administered 2016-09-27: 75 mg via ORAL
  Filled 2016-09-27: qty 1

## 2016-09-27 MED ORDER — INSULIN DETEMIR 100 UNIT/ML ~~LOC~~ SOLN
48.0000 [IU] | Freq: Once | SUBCUTANEOUS | Status: AC
  Administered 2016-09-27: 48 [IU] via SUBCUTANEOUS
  Filled 2016-09-27: qty 0.48

## 2016-09-27 MED ORDER — METOPROLOL TARTRATE 5 MG/5ML IV SOLN
5.0000 mg | Freq: Once | INTRAVENOUS | Status: DC
  Filled 2016-09-27: qty 5

## 2016-09-27 MED ORDER — METOPROLOL TARTRATE 5 MG/5ML IV SOLN: 5.0000 mg | Freq: Four times a day (QID) | INTRAVENOUS | Status: DC

## 2016-09-27 MED ORDER — FLUOXETINE HCL 20 MG PO CAPS
40.0000 mg | ORAL_CAPSULE | Freq: Every day | ORAL | Status: DC
  Administered 2016-09-27 – 2016-09-30 (×4): 40 mg via ORAL
  Filled 2016-09-27 (×4): qty 2

## 2016-09-27 MED ORDER — INFLUENZA VAC SPLIT QUAD 0.5 ML IM SUSY
0.5000 mL | PREFILLED_SYRINGE | INTRAMUSCULAR | Status: AC
  Administered 2016-09-28: 0.5 mL via INTRAMUSCULAR
  Filled 2016-09-27: qty 0.5

## 2016-09-27 MED ORDER — METOPROLOL TARTRATE 5 MG/5ML IV SOLN: 2.5000 mg | INTRAVENOUS | Status: AC

## 2016-09-27 MED ORDER — INSULIN DETEMIR 100 UNIT/ML ~~LOC~~ SOLN
50.0000 [IU] | Freq: Every day | SUBCUTANEOUS | Status: DC
  Administered 2016-09-27 – 2016-09-29 (×3): 50 [IU] via SUBCUTANEOUS
  Filled 2016-09-27 (×4): qty 0.5

## 2016-09-27 MED ORDER — COUMADIN BOOK
Freq: Once | Status: DC
  Filled 2016-09-27: qty 1

## 2016-09-27 MED ORDER — CARVEDILOL 25 MG PO TABS
25.0000 mg | ORAL_TABLET | Freq: Two times a day (BID) | ORAL | Status: DC
  Administered 2016-09-28: 25 mg via ORAL
  Filled 2016-09-27: qty 1

## 2016-09-27 MED ORDER — SODIUM CHLORIDE 0.9 % IV SOLN
INTRAVENOUS | Status: DC
  Administered 2016-09-27: 18:00:00 via INTRAVENOUS

## 2016-09-27 MED ORDER — INSULIN ASPART 100 UNIT/ML ~~LOC~~ SOLN
0.0000 [IU] | Freq: Three times a day (TID) | SUBCUTANEOUS | Status: DC
  Administered 2016-09-28: 3 [IU] via SUBCUTANEOUS
  Administered 2016-09-28 (×2): 5 [IU] via SUBCUTANEOUS
  Administered 2016-09-29: 11 [IU] via SUBCUTANEOUS
  Administered 2016-09-29: 3 [IU] via SUBCUTANEOUS
  Administered 2016-09-30: 8 [IU] via SUBCUTANEOUS
  Administered 2016-09-30: 5 [IU] via SUBCUTANEOUS

## 2016-09-27 MED ORDER — ONDANSETRON HCL 4 MG/2ML IJ SOLN: 4.0000 mg | Freq: Four times a day (QID) | INTRAMUSCULAR | Status: DC | PRN

## 2016-09-27 MED ORDER — ENOXAPARIN SODIUM 100 MG/ML ~~LOC~~ SOLN
100.0000 mg | Freq: Two times a day (BID) | SUBCUTANEOUS | Status: DC
  Administered 2016-09-27 – 2016-09-28 (×3): 100 mg via SUBCUTANEOUS
  Filled 2016-09-27 (×3): qty 1

## 2016-09-27 MED ORDER — WARFARIN - PHARMACIST DOSING INPATIENT: Freq: Every day | Status: DC

## 2016-09-27 NOTE — ED Notes (Signed)
Dinner tray ordered, carb modified diet

## 2016-09-27 NOTE — Telephone Encounter (Addendum)
Received Triage call. Pt stated he is experiencing SOB and hr sustaining 130s. Symptoms started Friday (09/24/16). Pt feels like his "head is spinning". He feels dizzy/faint after taking 10 steps. Pt denies CP. Pt has no way to check BP at home - machine broken. O2 sats have been 97-99%. Informed will forward to Richardson Dopp, PA ask if pt can be placed on schedule today. Informed pt to call back or call 911 if symptoms worsen. Informed will call pt back. Pt verbalized understanding and agreed with plan.

## 2016-09-27 NOTE — ED Triage Notes (Signed)
Pt here for generalized weakness and palpitations with SOB x 4 days; pt sts dizziness

## 2016-09-27 NOTE — Telephone Encounter (Addendum)
Called, spoke with pt. Informed Richardson Dopp, PA advised pt to go the emergency department to be evaluated for his symptoms. Informed pt NOT to drive. Have his wife drive him there or call 911 for an ambulance. Pt verbalized understanding of information and agreed with plan.

## 2016-09-27 NOTE — Progress Notes (Addendum)
ANTICOAGULATION CONSULT NOTE - Initial Consult  Pharmacy Consult for Lovenox; add Coumadin Indication: atrial fibrillation  Allergies  Allergen Reactions  . Metformin And Related Nausea Only and Other (See Comments)    Loss of appetite  . Lisinopril Cough    Patient Measurements:   Heparin Dosing Weight:   Vital Signs: Temp: 96.7 F (35.9 C) (10/16 1201) Temp Source: Oral (10/16 1201) BP: 131/104 (10/16 1710) Pulse Rate: 131 (10/16 1710)  Labs:  Recent Labs  09/27/16 1203 09/27/16 1331 09/27/16 1449  HGB 15.7  --   --   HCT 43.4  --   --   PLT 256  --   --   LABPROT  --  13.9  --   INR  --  1.07  --   CREATININE 1.69*  --  1.67*    CrCl cannot be calculated (Unknown ideal weight.).   Medical History: Past Medical History:  Diagnosis Date  . AICD (automatic cardioverter/defibrillator) present   . Anginal pain ([place])   . Anxiety   . CAD (coronary artery disease)    a.  s/p Stent to LAD '00;  b.  PTCA 2nd diagonal '10;  c. 02/18/12 Cath: moderate nonobs dzs - med rx  . CHF (congestive heart failure) ([place])   . Chronic ischemic heart disease   . Depression   . ED (erectile dysfunction)   . Episodic mood disorder ([place])   . GERD (gastroesophageal reflux disease)   . Heart murmur   . Hyperlipidemia   . Hypertension   . Ischemic cardiomyopathy    s/p St. [name] (Atlas) ICD implanted in [place] 2007  . MVP (mitral valve prolapse)    s/p MV annuloplasty at [facility] 2004  . Myocardial infarct, old    "/EKGs"  . Myocardial infarction ~ 1993   "cardiac arrest @ [facility]  . PAF (paroxysmal atrial fibrillation) ([place])    noted on ICD interrogation '10  . Type II diabetes mellitus ([place])    uncontrolled    Medications:  Scheduled:  . aspirin EC  81 mg Oral Daily  . atorvastatin  10 mg Oral Daily  . clopidogrel  75 mg Oral Daily  . FLUoxetine  40 mg Oral Daily  . [START ON 09/28/2016] insulin aspart  0-15 Units Subcutaneous TID WC  . insulin aspart   0-5 Units Subcutaneous QHS  . insulin detemir  50 Units Subcutaneous QHS  . linagliptin  5 mg Oral Daily  . metoprolol  2.5 mg Intravenous Q5 min  . metoprolol tartrate  25 mg Oral BID  . pantoprazole  40 mg Oral Daily  . tamsulosin  0.4 mg Oral Daily    Assessment: 65yo male presenting with dizziness and weakness, symptomatic AFib.  He is to start Lovenox.  Estimated CrCl 35-40, no dosage adjustment needed.  Goal of Therapy:  Anti-Xa level 0.6-1 units/ml 4hrs after LMWH dose given Monitor platelets by anticoagulation protocol: Yes   Plan:  Lovenox 100mg  SQ q12 CBC q72hr Watch for s/s of bleeding   Gracy Bruins, PharmD Clinical Pharmacist [facility]   ADDENDUM: Pharmacy now asked to start coumadin. Coumadin score = 6. Baseline INR = 1.07. No drug interactions noted.  Plan: Coumadin 7.5mg  x 1 Daily INR Education - book/video  Nena Jordan, PharmD, BCPS 09/27/2016, 6:20 PM

## 2016-09-27 NOTE — Telephone Encounter (Signed)
New message     Patient c/o Palpitations:  High priority if patient c/o lightheadedness and shortness of breath.  1. How long have you been having palpitations? 09-24-16  2. Are you currently experiencing lightheadedness and shortness of breath? Lightheadedness, SOB and nausea  3. Have you checked your BP and heart rate? (document readings) 133-140 4. Are you experiencing any other symptoms? no

## 2016-09-27 NOTE — ED Notes (Signed)
MD made aware of elevated glucose and lactic

## 2016-09-27 NOTE — H&P (Signed)
History and Physical    [name] [name] KGU:[id number] DOB: [birth year] DOA: 09/27/2016  Referring MD/NP/PA: ER PCP: Garwin Brothers, MD Outpatient Specialists:  Patient coming from: home  Chief Complaint: dizziness/weakness  HPI: [name] [name] is a 65 y.o. male with medical history significant of AICD, CAD, CHF, HTN, PAF.  He follows with Dr. Martinique as well as Dr. Rayann Heman for cardiac issues.  He has the following cardiac history: He underwent stenting of the proximal LAD and diagonal branch in 2000. Cardiac caths in 2005 Dorthula Rue MD) and 2013 ([place]) showed no obstructive disease. He is s/p MV repair at [facility] in 2004. He has chronic moderate MR. He has a history of chronic systolic CHF and has an ICD in place. In February 2016 he underwent right and left heart cath. He had stenting of the first diagonal and first OM with DES. There was diffuse LAD disease treated medically. EF 30-35%. Mild MR and normal right heart pressures.   He has chronic dizziness but on Saturday developed a more severe dizziness that felt like he was spinning and left him unable to stand to even urinate.  He has been feeling palpitations as well.  He is also short of breath.  No chest pain.    He has missed doses of levemir since the beginning of the month due to costs.  His blood sugars have been elevated due to this.  He is c/o testicular pain-- has happened before and has seen "a specialist" and told he had pockets of pus.  Patient states he is hurting again.   ED Course: St [name] came to evaluate AICD and he was found to be in flutter as of Saturday.  His CR was elevated as well to 1.67.  He was given levemir in ER.  Hospitalist were asked to admit for a flutter and uncontrolled DM.  Cardiology was also consulted  Review of Systems: all systems reviewed, negative unless stated above in HPI   Past Medical History:  Diagnosis Date  . AICD (automatic cardioverter/defibrillator) present   .  Anginal pain ([place])   . Anxiety   . CAD (coronary artery disease)    a.  s/p Stent to LAD '00;  b.  PTCA 2nd diagonal '10;  c. 02/18/12 Cath: moderate nonobs dzs - med rx  . CHF (congestive heart failure) ([place])   . Chronic ischemic heart disease   . Depression   . ED (erectile dysfunction)   . Episodic mood disorder ([place])   . GERD (gastroesophageal reflux disease)   . Heart murmur   . Hyperlipidemia   . Hypertension   . Ischemic cardiomyopathy    s/p St. [name] (Atlas) ICD implanted in [place] 2007  . MVP (mitral valve prolapse)    s/p MV annuloplasty at [facility] 2004  . Myocardial infarct, old    "/EKGs"  . Myocardial infarction ~ 1993   "cardiac arrest @ [facility]  . PAF (paroxysmal atrial fibrillation) ([place])    noted on ICD interrogation '10  . Type II diabetes mellitus ([place])    uncontrolled    Past Surgical History:  Procedure Laterality Date  . CARDIAC CATHETERIZATION  08/2009   diagnostic/notes 09/03/2009  . CARDIAC DEFIBRILLATOR PLACEMENT  2007   implanted in [place], has a 7001 RV lead and a SJM Atlas ICD followed by Dr Caryl Comes  . CARDIAC VALVE REPLACEMENT    . CORONARY ANGIOPLASTY  08/2009   Archie Endo 09/03/2009  . CORONARY ANGIOPLASTY WITH STENT PLACEMENT  01/14/2015   "2"  . CORONARY STENT PLACEMENT  ~ 2000  . IMPLANTABLE CARDIOVERTER DEFIBRILLATOR (ICD) GENERATOR CHANGE N/A 03/19/2013   Procedure: ICD GENERATOR CHANGE;  Surgeon: Evans Lance, MD;  Location: [facility] CATH LAB;  Service: Cardiovascular;  Laterality: N/A;  . IMPLANTABLE CARDIOVERTER DEFIBRILLATOR GENERATOR CHANGE  03/19/13   SJM Fortify ST DR generator placed by Dr Lovena Le, part of Analyze ST study  . LEFT AND RIGHT HEART CATHETERIZATION WITH CORONARY ANGIOGRAM N/A 01/14/2015   Procedure: LEFT AND RIGHT HEART CATHETERIZATION WITH CORONARY ANGIOGRAM;  Surgeon: Peter M Martinique, MD;  Location: [facility] CATH LAB;  Service: Cardiovascular;  Laterality: N/A;  . LEFT HEART CATHETERIZATION WITH CORONARY ANGIOGRAM N/A  02/17/2012   Procedure: LEFT HEART CATHETERIZATION WITH CORONARY ANGIOGRAM;  Surgeon: Jolaine Artist, MD;  Location: [facility] CATH LAB;  Service: Cardiovascular;  Laterality: N/A;  . MITRAL VALVE ANNULOPLASTY  2004   /notes 02/17/2012     reports that he has never smoked. He has never used smokeless tobacco. He reports that he DOES NOT drink alcohol. He reports that he uses drugs.  Allergies  Allergen Reactions  . Metformin And Related Nausea Only and Other (See Comments)    Loss of appetite  . Lisinopril Cough    Family History  Problem Relation Age of Onset  . Coronary artery disease Mother   . Colon cancer Mother   . Heart attack Mother   . Heart disease Mother   . Heart failure Mother   . Hypertension Mother   . Malignant hyperthermia Mother   . Colon cancer Sister   . Coronary artery disease Brother   . Heart disease Brother      Prior to Admission medications   Medication Sig Start Date End Date Taking? Authorizing Provider  aspirin EC 81 MG tablet Take 81 mg by mouth daily.   Yes Historical Provider, MD  atorvastatin (LIPITOR) 20 MG tablet Take 10 mg by mouth daily.    Yes Historical Provider, MD  carvedilol (COREG) 25 MG tablet Take 1 tablet (25 mg total) by mouth 2 (two) times daily with a meal. 01/21/15  Yes Peter M Martinique, MD  clopidogrel (PLAVIX) 75 MG tablet Take 1 tablet (75 mg total) by mouth daily. 04/22/15  Yes Peter M Martinique, MD  FLUoxetine (PROZAC) 40 MG capsule Take 40 mg by mouth daily.   Yes Historical Provider, MD  omeprazole (PRILOSEC) 40 MG capsule Take 40 mg by mouth daily.   Yes Historical Provider, MD  sitaGLIPtin (JANUVIA) 100 MG tablet Take 100 mg by mouth daily.   Yes Historical Provider, MD  spironolactone (ALDACTONE) 25 MG tablet Take 1 tablet (25 mg total) by mouth daily. 04/22/15  Yes Peter M Martinique, MD  valsartan (DIOVAN) 160 MG tablet Take 1 tablet (160 mg total) by mouth daily. 08/07/14  Yes Thompson Grayer, MD  LEVEMIR 100 UNIT/ML injection Inject 50  Units into the skin at bedtime. [place] BED 07/26/14   Historical Provider, MD  nitroGLYCERIN (NITROSTAT) 0.4 MG SL tablet Place 1 tablet (0.4 mg total) under the tongue every 5 (five) minutes x 3 doses as needed for chest pain. 09/15/12 01/29/15  Peter M Martinique, MD  nitroGLYCERIN (NITROSTAT) 0.4 MG SL tablet Place 1 tablet (0.4 mg total) under the tongue every 5 (five) minutes x 3 doses as needed for chest pain. 01/15/15   Brett Canales, PA-C  pantoprazole (PROTONIX) 40 MG tablet Take 1 tablet (40 mg total) by mouth daily. 05/16/15  Peter M Martinique, MD  tamsulosin (FLOMAX) 0.4 MG CAPS capsule Take 1 capsule by mouth daily. 12/17/13   Historical Provider, MD    Physical Exam: Vitals:   09/27/16 1201 09/27/16 1315 09/27/16 1345 09/27/16 1400  BP: (!) 151/104 122/80 105/73 128/99  Pulse: (!) 134  (!) 127 (!) 124  Resp: 26 14 19 19   Temp: (!) 96.7 F (35.9 C)     TempSrc: Oral     SpO2: 100%  100% 100%      Constitutional: NAD, calm, comfortable Vitals:   09/27/16 1201 09/27/16 1315 09/27/16 1345 09/27/16 1400  BP: (!) 151/104 122/80 105/73 128/99  Pulse: (!) 134  (!) 127 (!) 124  Resp: 26 14 19 19   Temp: (!) 96.7 F (35.9 C)     TempSrc: Oral     SpO2: 100%  100% 100%   Eyes: PERRL, lids and conjunctivae normal ENMT: Mucous membranes are moist. Posterior pharynx clear of any exudate or lesions.Normal dentition.  Neck: normal, supple, no masses, no thyromegaly Respiratory: clear to auscultation bilaterally, no wheezing, no crackles. Normal respiratory effort. No accessory muscle use.  Cardiovascular: irr and fast, no murmurs / rubs / gallops. No extremity edema. 2+ pedal pulses. No carotid bruits.  Abdomen: no tenderness, no masses palpated. No hepatosplenomegaly. Bowel sounds positive.  Musculoskeletal: no clubbing / cyanosis. No joint deformity upper and lower extremities. Good ROM, no contractures. Normal muscle tone.  Skin: no rashes, lesions, ulcers. No  induration Neurologic: CN 2-12 grossly intact. Sensation intact, DTR normal. Strength 5/5 in all 4.  Psychiatric: Normal judgment and insight. Alert and oriented x 3. Normal mood.     Labs on Admission: I have personally reviewed following labs and imaging studies  CBC:  Recent Labs Lab 09/27/16 1203  WBC 7.8  HGB 15.7  HCT 43.4  MCV 85.3  PLT 024   Basic Metabolic Panel:  Recent Labs Lab 09/27/16 1203 09/27/16 1449  NA 132* 132*  K 4.5 4.4  CL 98* 103  CO2 20* 22  GLUCOSE 521* 462*  BUN 23* 25*  CREATININE 1.69* 1.67*  CALCIUM 9.3 8.6*   GFR: CrCl cannot be calculated (Unknown ideal weight.). Liver Function Tests:  Recent Labs Lab 09/27/16 1331  AST 22  ALT 24  ALKPHOS 90  BILITOT 1.2  PROT 7.0  ALBUMIN 4.1   No results for input(s): LIPASE, AMYLASE in the last 168 hours. No results for input(s): AMMONIA in the last 168 hours. Coagulation Profile:  Recent Labs Lab 09/27/16 1331  INR 1.07   Cardiac Enzymes: No results for input(s): CKTOTAL, CKMB, CKMBINDEX, TROPONINI in the last 168 hours. BNP (last 3 results) No results for input(s): PROBNP in the last 8760 hours. HbA1C: No results for input(s): HGBA1C in the last 72 hours. CBG: No results for input(s): GLUCAP in the last 168 hours. Lipid Profile: No results for input(s): CHOL, HDL, LDLCALC, TRIG, CHOLHDL, LDLDIRECT in the last 72 hours. Thyroid Function Tests: No results for input(s): TSH, T4TOTAL, FREET4, T3FREE, THYROIDAB in the last 72 hours. Anemia Panel: No results for input(s): VITAMINB12, FOLATE, FERRITIN, TIBC, IRON, RETICCTPCT in the last 72 hours. Urine analysis: No results found for: COLORURINE, APPEARANCEUR, LABSPEC, PHURINE, GLUCOSEU, HGBUR, BILIRUBINUR, KETONESUR, PROTEINUR, UROBILINOGEN, NITRITE, LEUKOCYTESUR Sepsis Labs: Invalid input(s): PROCALCITONIN, LACTICIDVEN No results found for this or any previous visit (from the past 240 hour(s)).   Radiological Exams on  Admission: Dg Chest 2 View  Result Date: 09/27/2016 CLINICAL DATA:  Shortness of breath and  weakness. EXAM: CHEST  2 VIEW COMPARISON:  01/13/2015 FINDINGS: Stable position of the left cardiac dual-chamber ICD. Heart size is within normal limits and stable. Median sternotomy wires are noted. Both lungs are clear without pulmonary edema. No pleural effusions. No acute bone abnormality. IMPRESSION: No active cardiopulmonary disease. Electronically Signed   By: Markus Daft M.D.   On: 09/27/2016 12:59   Ct Head Wo Contrast  Result Date: 09/27/2016 CLINICAL DATA:  Dizziness and blurred vision. Dizzy spell yesterday falling. Subsequent headache and neck pain. EXAM: CT HEAD WITHOUT CONTRAST CT CERVICAL SPINE WITHOUT CONTRAST TECHNIQUE: Multidetector CT imaging of the head and cervical spine was performed following the standard protocol without intravenous contrast. Multiplanar CT image reconstructions of the cervical spine were also generated. COMPARISON:  03/28/2012.  02/16/2010. FINDINGS: CT HEAD FINDINGS Brain: Normal without evidence of accelerated atrophy, old or acute infarction, mass lesion, hemorrhage, hydrocephalus or extra-axial collection. Vascular: There is atherosclerotic calcification of the major vessels at the base of the brain. This is particularly notable affecting the left vertebral artery. Skull: Normal Sinuses/Orbits: Clear/normal Other: None significant CT CERVICAL SPINE FINDINGS Alignment: Normal Skull base and vertebrae: Normal. No fracture. No traumatic finding. Soft tissues and spinal canal: No significant finding. Disc levels:  C1-2: Ordinary mild osteoarthritis. C2-3: Facet degeneration on the left. Mild bony foraminal narrowing on the left. C3-4: Right-sided predominant facet arthropathy. Endplate osteophytes. Foraminal stenosis right more than left. C4-5:  Bilateral facet arthropathy.  Bilateral foraminal stenosis. C5-6:  Previous ACDF.  Solid union. C6-7: Degenerative spondylosis with  foraminal stenosis left worse than right. C7-T1: No significant finding. Upper chest: Negative Other: None significant IMPRESSION: Head CT: No acute or traumatic finding. Atherosclerotic calcification noted, particularly affecting the left vertebral artery. Cervical spine CT: No acute or traumatic finding. Previous ACDF C5-6. Degenerative changes at the other levels as outlined above. Foraminal stenosis on the left at C6-7 particularly pronounced. Electronically Signed   By: Nelson Chimes M.D.   On: 09/27/2016 15:13   Ct Cervical Spine Wo Contrast  Result Date: 09/27/2016 CLINICAL DATA:  Dizziness and blurred vision. Dizzy spell yesterday falling. Subsequent headache and neck pain. EXAM: CT HEAD WITHOUT CONTRAST CT CERVICAL SPINE WITHOUT CONTRAST TECHNIQUE: Multidetector CT imaging of the head and cervical spine was performed following the standard protocol without intravenous contrast. Multiplanar CT image reconstructions of the cervical spine were also generated. COMPARISON:  03/28/2012.  02/16/2010. FINDINGS: CT HEAD FINDINGS Brain: Normal without evidence of accelerated atrophy, old or acute infarction, mass lesion, hemorrhage, hydrocephalus or extra-axial collection. Vascular: There is atherosclerotic calcification of the major vessels at the base of the brain. This is particularly notable affecting the left vertebral artery. Skull: Normal Sinuses/Orbits: Clear/normal Other: None significant CT CERVICAL SPINE FINDINGS Alignment: Normal Skull base and vertebrae: Normal. No fracture. No traumatic finding. Soft tissues and spinal canal: No significant finding. Disc levels:  C1-2: Ordinary mild osteoarthritis. C2-3: Facet degeneration on the left. Mild bony foraminal narrowing on the left. C3-4: Right-sided predominant facet arthropathy. Endplate osteophytes. Foraminal stenosis right more than left. C4-5:  Bilateral facet arthropathy.  Bilateral foraminal stenosis. C5-6:  Previous ACDF.  Solid union. C6-7:  Degenerative spondylosis with foraminal stenosis left worse than right. C7-T1: No significant finding. Upper chest: Negative Other: None significant IMPRESSION: Head CT: No acute or traumatic finding. Atherosclerotic calcification noted, particularly affecting the left vertebral artery. Cervical spine CT: No acute or traumatic finding. Previous ACDF C5-6. Degenerative changes at the other levels as outlined above. Foraminal stenosis  on the left at C6-7 particularly pronounced. Electronically Signed   By: Nelson Chimes M.D.   On: 09/27/2016 15:13    EKG: Independently reviewed. ?a fib  Assessment/Plan Active Problems:   Automatic implantable cardioverter-defibrillator in situ   DM (diabetes mellitus), type 2 (HCC)   Atrial flutter with rapid ventricular response (HCC)   Dizziness  Symptomatic a fib -?dizziness -IV lopressor -cardiology consult -echo ordered  Uncontrolled DM with hyperglycemia -SSI -unable to afford levemir  Dizziness -CT ok -unable to get MRI due to pacemaker  AKI -IVF -recheck in AM  Testicular pain -story inconsistant -work up appears to have been done as outpatient -get testicular U/S here   DVT prophylaxis: lovenox (full dose) Code Status: full Family Communication: patient Disposition Plan: home once stable Consults called: cardiology (by ER doc) Admission status: inpt   Larson DO [facility] Pager [phone]  If 7PM-7AM, please contact night-coverage [email or web address] Password [email or web address]  09/27/2016, 5:10 PM

## 2016-09-27 NOTE — ED Notes (Signed)
Report called and received

## 2016-09-27 NOTE — ED Provider Notes (Signed)
[facility] DEPT Provider Note   CSN: [id number] Arrival date & time: 09/27/16  1154     History   Chief Complaint Chief Complaint  Patient presents with  . Weakness    HPI [name] [name] is a 65 y.o. male With a past medical history significant for CAD, atrial fibrillation, ICD/defibrillator placement, CHF, hypertension/hyperlipidemia, diabetes, and GERD who presents for lightheadedness, dizziness, palpitations, and feeling of tachycardia. Patient is accompanied by family who reports that for the last two days, patient has had changes in heart rate with intermittent symptoms. He reports that his heart rate has been in the 140s and that intermittently will go back into the 60s and 70s. He said that he is had a history of outstations in the past prompting his ICD/pacer placement.   He reports that he is having both lightheadedness as well as had spinning sensation. He denies any history of stroke and reports that he cannot get MRIs due to pacemaker. He says that yesterday, he was feeling dizzy and had a fall onto a mattress. He did not hit his head.  He denies fevers, chills, vomiting, constipation, diarrhea, dysuria. He reports that he has been out of his insulin for diabetes management for the last two weeks. He says his sugars have been running "very high". He reports nausea but no emesis. He reports having some chest pressure and shortness of breath that he relates to his palpitations. He denies radiation of pressure and describes it is moderate. He denies any exertional symptoms.  Palpitations   This is a new problem. The current episode started 2 days ago. The problem occurs constantly. The problem has not changed since onset.Associated symptoms include chest pain, chest pressure, near-syncope, nausea, headaches, dizziness and shortness of breath. Pertinent negatives include no diaphoresis, no fever, no numbness, no exertional chest pressure, no syncope, no abdominal pain, no  vomiting, no back pain, no lower extremity edema, no weakness, no cough and no hemoptysis. He has tried nothing for the symptoms.  Chest Pain   Associated symptoms include dizziness, headaches, nausea, near-syncope, palpitations and shortness of breath. Pertinent negatives include no abdominal pain, no back pain, no cough, no diaphoresis, no exertional chest pressure, no fever, no hemoptysis, no lower extremity edema, no numbness, no syncope, no vomiting and no weakness.  Pertinent negatives for past medical history include no seizures.  Dizziness  Associated symptoms: chest pain, headaches, nausea, palpitations and shortness of breath   Associated symptoms: no blood in stool, no diarrhea, no syncope, no vomiting and no weakness     Past Medical History:  Diagnosis Date  . AICD (automatic cardioverter/defibrillator) present   . Anginal pain ([place])   . Anxiety   . CAD (coronary artery disease)    a.  s/p Stent to LAD '00;  b.  PTCA 2nd diagonal '10;  c. 02/18/12 Cath: moderate nonobs dzs - med rx  . CHF (congestive heart failure) ([place])   . Chronic ischemic heart disease   . Depression   . ED (erectile dysfunction)   . Episodic mood disorder ([place])   . GERD (gastroesophageal reflux disease)   . Heart murmur   . Hyperlipidemia   . Hypertension   . Ischemic cardiomyopathy    s/p St. [name] (Atlas) ICD implanted in [place] 2007  . MVP (mitral valve prolapse)    s/p MV annuloplasty at [facility] 2004  . Myocardial infarct, old    "/EKGs"  . Myocardial infarction ~ 1993   "cardiac arrest @ [employer] [facility]"  . PAF (paroxysmal atrial fibrillation) ([place])    noted on ICD interrogation '10  . Type II diabetes mellitus ([place])    uncontrolled    Patient Active Problem List   Diagnosis Date Noted  . Chronic systolic heart failure ([place]) 04/22/2015  . Abnormal nuclear stress test 01/14/2015  . Chronic systolic dysfunction of left ventricle 07/25/2013  . DM (diabetes mellitus), type 2 ([place])  03/19/2013  . ICD (implantable cardiac defibrillator) battery depletion 03/18/2013  . Near syncope 09/15/2012  . CAD (coronary artery disease) 02/19/2012  . Unstable angina ([place]) 02/19/2012  . Automatic implantable cardioverter-defibrillator in situ 08/18/2011  . Ischemic cardiomyopathy 05/05/2011  . Hypertension   . Hyperlipidemia   . GERD (gastroesophageal reflux disease)   . Mitral insufficiency   . IHD (ischemic heart disease)   . Anxiety     Past Surgical History:  Procedure Laterality Date  . CARDIAC CATHETERIZATION  08/2009   diagnostic/notes 09/03/2009  . CARDIAC DEFIBRILLATOR PLACEMENT  2007   implanted in [place], has a 7001 RV lead and a SJM Atlas ICD followed by Dr Caryl Comes  . CARDIAC VALVE REPLACEMENT    . CORONARY ANGIOPLASTY  08/2009   Archie Endo 09/03/2009  . CORONARY ANGIOPLASTY WITH STENT PLACEMENT  01/14/2015   "2"  . CORONARY STENT PLACEMENT  ~ 2000  . IMPLANTABLE CARDIOVERTER DEFIBRILLATOR (ICD) GENERATOR CHANGE N/A 03/19/2013   Procedure: ICD GENERATOR CHANGE;  Surgeon: Evans Lance, MD;  Location[facility] CATH LAB;  Service: Cardiovascular;  Laterality: N/A;  . IMPLANTABLE CARDIOVERTER DEFIBRILLATOR GENERATOR CHANGE  03/19/13   SJM Fortify ST DR generator placed by Dr Lovena Le, part of Analyze ST study  . LEFT AND RIGHT HEART CATHETERIZATION WITH CORONARY ANGIOGRAM N/A 01/14/2015   Procedure: LEFT AND RIGHT HEART CATHETERIZATION WITH CORONARY ANGIOGRAM;  Surgeon: Peter M Martinique, MD;  Location: [facility] CATH LAB;  Service: Cardiovascular;  Laterality: N/A;  . LEFT HEART CATHETERIZATION WITH CORONARY ANGIOGRAM N/A 02/17/2012   Procedure: LEFT HEART CATHETERIZATION WITH CORONARY ANGIOGRAM;  Surgeon: Jolaine Artist, MD;  Location: [facility] CATH LAB;  Service: Cardiovascular;  Laterality: N/A;  . MITRAL VALVE ANNULOPLASTY  2004   Archie Endo 02/17/2012    OB History    No data available       Home Medications    Prior to Admission medications   Medication Sig Start Date End Date Taking?  Authorizing Provider  aspirin EC 81 MG tablet Take 81 mg by mouth daily.    Historical Provider, MD  atorvastatin (LIPITOR) 20 MG tablet Take 20 mg by mouth daily.      Historical Provider, MD  carvedilol (COREG) 25 MG tablet Take 1 tablet (25 mg total) by mouth 2 (two) times daily with a meal. 01/21/15   Peter M Martinique, MD  clopidogrel (PLAVIX) 75 MG tablet Take 1 tablet (75 mg total) by mouth daily. 04/22/15   Peter M Martinique, MD  FLUoxetine (PROZAC) 40 MG capsule Take 40 mg by mouth daily.    Historical Provider, MD  LEVEMIR 100 UNIT/ML injection Inject 50 Units into the skin at bedtime. Enhaut BED 07/26/14   Historical Provider, MD  nitroGLYCERIN (NITROSTAT) 0.4 MG SL tablet Place 1 tablet (0.4 mg total) under the tongue every 5 (five) minutes x 3 doses as needed for chest pain. 09/15/12 01/29/15  Peter M Martinique, MD  nitroGLYCERIN (NITROSTAT) 0.4 MG SL tablet Place 1 tablet (0.4 mg total) under the tongue every 5 (five) minutes x 3 doses as needed  for chest pain. 01/15/15   Brett Canales, PA-C  pantoprazole (PROTONIX) 40 MG tablet Take 1 tablet (40 mg total) by mouth daily. 05/16/15   Peter M Martinique, MD  spironolactone (ALDACTONE) 25 MG tablet Take 1 tablet (25 mg total) by mouth daily. 04/22/15   Peter M Martinique, MD  tamsulosin (FLOMAX) 0.4 MG CAPS capsule Take 1 capsule by mouth daily. 12/17/13   Historical Provider, MD  valsartan (DIOVAN) 160 MG tablet Take 1 tablet (160 mg total) by mouth daily. 08/07/14   Thompson Grayer, MD    Family History Family History  Problem Relation Age of Onset  . Coronary artery disease Mother   . Colon cancer Mother   . Heart attack Mother   . Heart disease Mother   . Heart failure Mother   . Hypertension Mother   . Malignant hyperthermia Mother   . Colon cancer Sister   . Coronary artery disease Brother   . Heart disease Brother     Social History Social History  Substance Use Topics  . Smoking status: Never Smoker  . Smokeless tobacco: Never Used    . Alcohol use 0.0 oz/week     Comment: 01/14/2015 "used to drink alot; stopped completely in 1983"     Allergies   Metformin and related and Lisinopril   Review of Systems Review of Systems  Constitutional: Positive for fatigue. Negative for activity change, chills, diaphoresis and fever.  HENT: Negative for congestion and rhinorrhea.   Eyes: Negative for visual disturbance.  Respiratory: Positive for chest tightness and shortness of breath. Negative for cough, hemoptysis, wheezing and stridor.   Cardiovascular: Positive for chest pain, palpitations and near-syncope. Negative for leg swelling and syncope.  Gastrointestinal: Positive for nausea. Negative for abdominal distention, abdominal pain, blood in stool, constipation, diarrhea and vomiting.  Genitourinary: Negative for difficulty urinating, dysuria, flank pain and frequency.  Musculoskeletal: Negative for back pain, gait problem, neck pain and neck stiffness.  Skin: Negative for rash and wound.  Neurological: Positive for dizziness, light-headedness and headaches. Negative for seizures, weakness and numbness.  Psychiatric/Behavioral: Negative for agitation.  All other systems reviewed and are negative.    Physical Exam Updated Vital Signs BP (!) 151/104 (BP Location: Right Arm)   Pulse (!) 134   Temp (!) 96.7 F (35.9 C) (Oral)   Resp 26   SpO2 100%   Physical Exam  Constitutional: He is oriented to person, place, and time. He appears well-developed and well-nourished.  HENT:  Head: Normocephalic and atraumatic.  Eyes: Conjunctivae are normal.  Neck: Normal range of motion. Neck supple.  Cardiovascular: Intact distal pulses.  An irregular rhythm present. Tachycardia present.   Murmur heard. Pulmonary/Chest: Effort normal and breath sounds normal. No stridor. No respiratory distress. He has no wheezes. He has no rales. He exhibits no tenderness.  Abdominal: Soft. There is no tenderness.  Musculoskeletal: He exhibits  no edema or tenderness.  Neurological: He is alert and oriented to person, place, and time. He has normal reflexes. He displays no tremor and normal reflexes. No cranial nerve deficit or sensory deficit. He exhibits normal muscle tone. Coordination and gait abnormal. GCS eye subscore is 4. GCS verbal subscore is 5. GCS motor subscore is 6.  Skin: Skin is warm and dry.  Psychiatric: He has a normal mood and affect.  Nursing note and vitals reviewed.    ED Treatments / Results  Labs (all labs ordered are listed, but only abnormal results are displayed) Labs Reviewed  CBC - Abnormal; Notable for the following:       Result Value   MCHC 36.2 (*)    All other components within normal limits  I-STAT CG4 LACTIC ACID, ED - Abnormal; Notable for the following:    Lactic Acid, Venous 2.29 (*)    All other components within normal limits  BASIC METABOLIC PANEL  I-STAT TROPOININ, ED    EKG  EKG Interpretation  Date/Time:  Monday September 27 2016 12:02:01 EDT Ventricular Rate:  135 PR Interval:    QRS Duration: 114 QT Interval:  346 QTC Calculation: 519 R Axis:   123 Text Interpretation:  Supraventricular tachycardia Incomplete right bundle branch block Left posterior fascicular block Anterior infarct , age undetermined Abnormal ECG Possible a flutter No STEMI Confirmed by Sherry Ruffing MD, Celvin Taney [phone]) on 09/27/2016 10:45:44 PM       Radiology Dg Chest 2 View  Result Date: 09/27/2016 CLINICAL DATA:  Shortness of breath and weakness. EXAM: CHEST  2 VIEW COMPARISON:  01/13/2015 FINDINGS: Stable position of the left cardiac dual-chamber ICD. Heart size is within normal limits and stable. Median sternotomy wires are noted. Both lungs are clear without pulmonary edema. No pleural effusions. No acute bone abnormality. IMPRESSION: No active cardiopulmonary disease. Electronically Signed   By: Markus Daft M.D.   On: 09/27/2016 12:59   Ct Head Wo Contrast  Result Date: 09/27/2016 CLINICAL  DATA:  Dizziness and blurred vision. Dizzy spell yesterday falling. Subsequent headache and neck pain. EXAM: CT HEAD WITHOUT CONTRAST CT CERVICAL SPINE WITHOUT CONTRAST TECHNIQUE: Multidetector CT imaging of the head and cervical spine was performed following the standard protocol without intravenous contrast. Multiplanar CT image reconstructions of the cervical spine were also generated. COMPARISON:  03/28/2012.  02/16/2010. FINDINGS: CT HEAD FINDINGS Brain: Normal without evidence of accelerated atrophy, old or acute infarction, mass lesion, hemorrhage, hydrocephalus or extra-axial collection. Vascular: There is atherosclerotic calcification of the major vessels at the base of the brain. This is particularly notable affecting the left vertebral artery. Skull: Normal Sinuses/Orbits: Clear/normal Other: None significant CT CERVICAL SPINE FINDINGS Alignment: Normal Skull base and vertebrae: Normal. No fracture. No traumatic finding. Soft tissues and spinal canal: No significant finding. Disc levels:  C1-2: Ordinary mild osteoarthritis. C2-3: Facet degeneration on the left. Mild bony foraminal narrowing on the left. C3-4: Right-sided predominant facet arthropathy. Endplate osteophytes. Foraminal stenosis right more than left. C4-5:  Bilateral facet arthropathy.  Bilateral foraminal stenosis. C5-6:  Previous ACDF.  Solid union. C6-7: Degenerative spondylosis with foraminal stenosis left worse than right. C7-T1: No significant finding. Upper chest: Negative Other: None significant IMPRESSION: Head CT: No acute or traumatic finding. Atherosclerotic calcification noted, particularly affecting the left vertebral artery. Cervical spine CT: No acute or traumatic finding. Previous ACDF C5-6. Degenerative changes at the other levels as outlined above. Foraminal stenosis on the left at C6-7 particularly pronounced. Electronically Signed   By: Nelson Chimes M.D.   On: 09/27/2016 15:13   Ct Cervical Spine Wo Contrast  Result  Date: 09/27/2016 CLINICAL DATA:  Dizziness and blurred vision. Dizzy spell yesterday falling. Subsequent headache and neck pain. EXAM: CT HEAD WITHOUT CONTRAST CT CERVICAL SPINE WITHOUT CONTRAST TECHNIQUE: Multidetector CT imaging of the head and cervical spine was performed following the standard protocol without intravenous contrast. Multiplanar CT image reconstructions of the cervical spine were also generated. COMPARISON:  03/28/2012.  02/16/2010. FINDINGS: CT HEAD FINDINGS Brain: Normal without evidence of accelerated atrophy, old or acute infarction, mass lesion, hemorrhage, hydrocephalus or extra-axial  collection. Vascular: There is atherosclerotic calcification of the major vessels at the base of the brain. This is particularly notable affecting the left vertebral artery. Skull: Normal Sinuses/Orbits: Clear/normal Other: None significant CT CERVICAL SPINE FINDINGS Alignment: Normal Skull base and vertebrae: Normal. No fracture. No traumatic finding. Soft tissues and spinal canal: No significant finding. Disc levels:  C1-2: Ordinary mild osteoarthritis. C2-3: Facet degeneration on the left. Mild bony foraminal narrowing on the left. C3-4: Right-sided predominant facet arthropathy. Endplate osteophytes. Foraminal stenosis right more than left. C4-5:  Bilateral facet arthropathy.  Bilateral foraminal stenosis. C5-6:  Previous ACDF.  Solid union. C6-7: Degenerative spondylosis with foraminal stenosis left worse than right. C7-T1: No significant finding. Upper chest: Negative Other: None significant IMPRESSION: Head CT: No acute or traumatic finding. Atherosclerotic calcification noted, particularly affecting the left vertebral artery. Cervical spine CT: No acute or traumatic finding. Previous ACDF C5-6. Degenerative changes at the other levels as outlined above. Foraminal stenosis on the left at C6-7 particularly pronounced. Electronically Signed   By: Nelson Chimes M.D.   On: 09/27/2016 15:13     Procedures Procedures (including critical care time)  Medications Ordered in ED Medications  tamsulosin (FLOMAX) capsule 0.4 mg (not administered)  pantoprazole (PROTONIX) EC tablet 40 mg (40 mg Oral Given 09/27/16 1840)  linagliptin (TRADJENTA) tablet 5 mg (5 mg Oral Given 09/27/16 1840)  aspirin EC tablet 81 mg (81 mg Oral Given 09/27/16 1840)  FLUoxetine (PROZAC) capsule 40 mg (40 mg Oral Given 09/27/16 1840)  insulin detemir (LEVEMIR) injection 50 Units (50 Units Subcutaneous Given 09/27/16 2217)  atorvastatin (LIPITOR) tablet 10 mg (10 mg Oral Given 09/27/16 1840)  acetaminophen (TYLENOL) tablet 650 mg (not administered)  ondansetron (ZOFRAN) injection 4 mg (not administered)  insulin aspart (novoLOG) injection 0-15 Units (not administered)  insulin aspart (novoLOG) injection 0-5 Units (4 Units Subcutaneous Given 09/27/16 2216)  metoprolol (LOPRESSOR) injection 2.5 mg (2.5 mg Intravenous Not Given 09/27/16 1800)  0.9 %  sodium chloride infusion ( Intravenous New Bag/Given 09/27/16 1745)  enoxaparin (LOVENOX) injection 100 mg (100 mg Subcutaneous Given 09/27/16 1840)  Influenza vac split quadrivalent PF (FLUARIX) injection 0.5 mL (not administered)  coumadin book (not administered)  Warfarin - Pharmacist Dosing Inpatient (not administered)  carvedilol (COREG) tablet 25 mg (not administered)  metoprolol (LOPRESSOR) injection 2.5 mg (2.5 mg Intravenous Given 09/27/16 1935)  metoprolol (LOPRESSOR) injection 5 mg (0 mg Intravenous Hold 09/27/16 2200)  aspirin chewable tablet 324 mg (324 mg Oral Given 09/27/16 1344)  sodium chloride 0.9 % bolus 1,000 mL (0 mLs Intravenous Stopped 09/27/16 1502)  sodium chloride 0.9 % bolus 1,000 mL (0 mLs Intravenous Stopped 09/27/16 1503)  insulin detemir (LEVEMIR) injection 48 Units (48 Units Subcutaneous Given 09/27/16 1712)  warfarin (COUMADIN) tablet 7.5 mg (7.5 mg Oral Given 09/27/16 1840)  warfarin (COUMADIN) video ( Does not apply Given  09/27/16 1830)     Initial Impression / Assessment and Plan / ED Course  I have reviewed the triage vital signs and the nursing notes.  Pertinent labs & imaging results that were available during my care of the patient were reviewed by me and considered in my medical decision making (see chart for details).  Clinical Course    [name] [name] is a 65 y.o. male With a past medical history significant for CAD, atrial fibrillation, ICD/defibrillator placement, CHF, hypertension/hyperlipidemia, diabetes, and GERD who presents for lightheadedness, dizziness, palpitations, and feeling of tachycardia.  History and exam are seen above.  Patient was stood  up and had persistent dizziness and develop worsened lightheadedness. Patient was unable to walk due to dizziness. Patient had normal fingernails finger and other coordination testing. Patient's lungs were clear on exam. Abdomen was nontender.  EKG showed concern for atrial flutter with RVR. Troponin, chest x-ray, and labs were ordered. Cardiac device was interrogated and showed atrial flutter onset on Saturday, two days ago. Troponin ordered and was Nonelevated. CBC showed no anemia or leukocytosis. INR normal 1.07.  Glucose was elevated the 500s. Fluids were ordered and improve two 462. Patient also found to have acute kidney injury with a creatinine of 1.69.  Lactic acid nonelevated.  CT head and neck was ordered due to spinning sensation and fall. No evidence of acute trauma was seen. No fractures. No bleeding.  Patient admitted to hospitalist service for further management of hyperglycemia, chest pain with history of stenting, new atrial flutter rhythm not on anticoagulation, and tachycardia. Patient was given Levemir in accordance with his home regimen. Cardiology was called and will follow the patient for further arrhythmia management.  Patient had no other problems in emergency department and was admitted in stable  condition.    Final Clinical Impressions(s) / ED Diagnoses   Final diagnoses:  Hyperglycemia  Atrial flutter with rapid ventricular response (HCC)  Chest pain, unspecified type  Shortness of breath     Clinical Impression: 1. Hyperglycemia   2. Atrial flutter with rapid ventricular response (HCC)   3. Chest pain, unspecified type   4. Shortness of breath   5. Pain     Disposition: Admit to Hospitalist service  New Prescriptions New Prescriptions   No medications on file     Courtney Paris, MD 09/27/16 2246

## 2016-09-27 NOTE — Consult Note (Signed)
Cardiology Consult    Patient ID: [name] [name] MRN: [id number], DOB/AGE: [birth year]   Admit date: 09/27/2016 Date of Consult: 09/27/2016  Primary Physician: Garwin Brothers, MD Primary Cardiologist: Berneice Gandy, MD  Requesting Provider: Princella Ion, MD  Patient Profile    65 y/o ? with a h/o CAD, ICM s/p ICD, chronic combined CHF, MVP s/p annuloplasty in 2004, HTN, HL, DM II, and GERD, who was admitted 10/16 secondary to development of palpitations, chest tightness, and dizziness beginning on 10/14, and found to be atypical atrial flutter.  Past Medical History   Past Medical History:  Diagnosis Date  . AICD (automatic cardioverter/defibrillator) present   . Anxiety   . CAD (coronary artery disease)    a.  1993 s/p MI - [employer] [facility];  b. s/p BMS to LAD '00;  c. PTCA 2nd diagonal 2010;  d. 02/18/12 Cath: moderate nonobs dzs - med rx;  e.  01/2015 Cath: LM nl, LAD 40-5m ISR, 70-79m/d, d1 90p (3.0x16 Synergy DES), D2 50-60, LCX nl, OM1 50p, 26m (2.5x12 Synergy DES), RCA nl, EF 30-35%.  . Chronic combined systolic and diastolic CHF (congestive heart failure) ([place])    a. 12/2014 Echo: EF 30-35%, Gr2 DD, mod MR, sev dil LA.  Marland Kitchen Chronic ischemic heart disease   . CKD (chronic kidney disease), stage III   . Depression   . ED (erectile dysfunction)   . Episodic mood disorder ([place])   . GERD (gastroesophageal reflux disease)   . Heart murmur   . Hyperlipidemia   . Hypertension   . Ischemic cardiomyopathy    a. s/p St. [name] (Atlas) ICD implanted in [place] 2007;  b. 12/2014 Echo: Ef 30-35%.  . MVP (mitral valve prolapse)    a. s/p MV annuloplasty at [facility] 2004.  Marland Kitchen PAF (paroxysmal atrial fibrillation) ([place])    a. noted on ICD interrogation '10 - not previously on [place] - CHA2DS2VASc = 5.  . Type II diabetes mellitus ([place])    uncontrolled    Past Surgical History:  Procedure Laterality Date  . CARDIAC CATHETERIZATION  08/2009   diagnostic/notes 09/03/2009  . CARDIAC  DEFIBRILLATOR PLACEMENT  2007   implanted in [place], has a 7001 RV lead and a SJM Atlas ICD followed by Dr Caryl Comes  . CARDIAC VALVE REPLACEMENT    . CORONARY ANGIOPLASTY  08/2009   Archie Endo 09/03/2009  . CORONARY ANGIOPLASTY WITH STENT PLACEMENT  01/14/2015   "2"  . CORONARY STENT PLACEMENT  ~ 2000  . IMPLANTABLE CARDIOVERTER DEFIBRILLATOR (ICD) GENERATOR CHANGE N/A 03/19/2013   Procedure: ICD GENERATOR CHANGE;  Surgeon: Evans Lance, MD;  Location: [facility] CATH LAB;  Service: Cardiovascular;  Laterality: N/A;  . IMPLANTABLE CARDIOVERTER DEFIBRILLATOR GENERATOR CHANGE  03/19/13   SJM Fortify ST DR generator placed by Dr Lovena Le, part of Analyze ST study  . LEFT AND RIGHT HEART CATHETERIZATION WITH CORONARY ANGIOGRAM N/A 01/14/2015   Procedure: LEFT AND RIGHT HEART CATHETERIZATION WITH CORONARY ANGIOGRAM;  Surgeon: Peter M Martinique, MD;  Location: [facility] CATH LAB;  Service: Cardiovascular;  Laterality: N/A;  . LEFT HEART CATHETERIZATION WITH CORONARY ANGIOGRAM N/A 02/17/2012   Procedure: LEFT HEART CATHETERIZATION WITH CORONARY ANGIOGRAM;  Surgeon: Jolaine Artist, MD;  Location: [facility] CATH LAB;  Service: Cardiovascular;  Laterality: N/A;  . MITRAL VALVE ANNULOPLASTY  2004   Archie Endo 02/17/2012     Allergies  Allergies  Allergen Reactions  . Metformin And Related Nausea Only and Other (See Comments)    Loss of appetite  .  Lisinopril Cough    History of Present Illness    65 y/o ? with the above complex PMH including CAD s/p remote MI in 1993 followed by BMS of the LAD in 2000, PTCA of the D2 in 2010, and PCI/DES of the D1 and OM1 in 01/2015.  He also has a h/o MVP s/p MV annuloplasty in 2004, ICM/chronic combined systolic/diastolic CHF s/p AICD in [place] in 2007, HTN, HL, and DM II.  PAF was noted on device interrogation in 2010, though pt has never been on oral anticoagulation. He mentions tonight that someone had previously discussed coumadin with him, though he thought that it would be too expensive.  Mr.  Callow was in his usual state of health until the evening of 10/14, when @ approximately 8 PM, he developed sudden onset of palpitations, dizziness, and chest heaviness.  He did not take anything for this.  Symptoms persisted throughout the day on 10/15 and into 10/16.  For that reason, he called our office this AM and was advised to present to the ED for evaluation.  There, he was found to be in atrial flutter @ a rate of 135.  Labs notable for stable CKD III.  CBC nl.  Trop 0.03.  BNP 284.3.  CXR non-acute.  He was admitted by medicine and we've been asked to eval.  He currently has no complaints.  Chest pressure has resolved w/o intervention.  HRs currently in the 120's.  Inpatient Medications    . aspirin EC  81 mg Oral Daily  . atorvastatin  10 mg Oral Daily  . clopidogrel  75 mg Oral Daily  . coumadin book   Does not apply Once  . enoxaparin (LOVENOX) injection  100 mg Subcutaneous Q12H  . FLUoxetine  40 mg Oral Daily  . [START ON 09/28/2016] Influenza vac split quadrivalent PF  0.5 mL Intramuscular Tomorrow-1000  . [START ON 09/28/2016] insulin aspart  0-15 Units Subcutaneous TID WC  . insulin aspart  0-5 Units Subcutaneous QHS  . insulin detemir  50 Units Subcutaneous QHS  . linagliptin  5 mg Oral Daily  . metoprolol tartrate  25 mg Oral BID  . pantoprazole  40 mg Oral Daily  . [START ON 09/28/2016] tamsulosin  0.4 mg Oral Daily  . [START ON 09/28/2016] Warfarin - Pharmacist Dosing Inpatient   Does not apply q1800    Family History    Family History  Problem Relation Age of Onset  . Coronary artery disease Mother   . Colon cancer Mother   . Heart attack Mother   . Heart disease Mother   . Heart failure Mother   . Hypertension Mother   . Malignant hyperthermia Mother   . Colon cancer Sister   . Coronary artery disease Brother   . Heart disease Brother     Social History    Social History   Social History  . Marital status: Married    Spouse name: N/A  . Number  of children: 8  . Years of education: N/A   Occupational History  . lawmaker     disabled   Social History Main Topics  . Smoking status: Never Smoker  . Smokeless tobacco: Never Used  . Alcohol use 0.0 oz/week     Comment: 01/14/2015 "used to drink alot; stopped completely in 1983"  . Drug use:      Comment: 01/14/2015 "used to use about anything I could get; stopped completely in 1983"  . Sexual activity: No  Other Topics Concern  . Not on file   Social History Narrative  . No narrative on file     Review of Systems    General:  No chills, fever, night sweats or weight changes.  Cardiovascular:  +++ chest pressure, prior to arrival, no dyspnea on exertion, edema, orthopnea, +++ palpitations, no paroxysmal nocturnal dyspnea.  +++ dizziness/presyncope. Dermatological: No rash, lesions/masses Respiratory: No cough, dyspnea Urologic: No hematuria, dysuria Abdominal:   No nausea, vomiting, diarrhea, bright red blood per rectum, melena, or hematemesis Neurologic:  No visual changes, wkns, changes in mental status. All other systems reviewed and are otherwise negative except as noted above.  Physical Exam    Blood pressure (!) 133/97, pulse (!) 133, temperature (!) 96.7 F (35.9 C), temperature source Oral, resp. rate 18, height 6' (1.829 m), weight 223 lb 12.8 oz (101.5 kg), SpO2 100 %.  General: Pleasant, NAD Psych: Normal affect. Neuro: Alert and oriented X 3. Moves all extremities spontaneously. HEENT: Normal  Neck: Supple without bruits or JVD. Lungs:  Resp regular and unlabored, CTA. Heart: RRR, tachy, no s3, s4, or murmurs. Abdomen: Soft, non-tender, non-distended, BS + x 4.  Extremities: No clubbing, cyanosis or edema. DP/PT/Radials 2+ and equal bilaterally.  Labs     Recent Labs  09/27/16 1220  TROPIPOC 0.03   Lab Results  Component Value Date   WBC 8.3 09/27/2016   HGB 14.7 09/27/2016   HCT 41.0 09/27/2016   MCV 85.2 09/27/2016   PLT 222 09/27/2016       Recent Labs Lab 09/27/16 1331 09/27/16 1449  NA  --  132*  K  --  4.4  CL  --  103  CO2  --  22  BUN  --  25*  CREATININE  --  1.67*  CALCIUM  --  8.6*  PROT 7.0  --   BILITOT 1.2  --   ALKPHOS 90  --   ALT 24  --   AST 22  --   GLUCOSE  --  462*   Lab Results  Component Value Date   CHOL 159 11/19/2011   HDL 41.10 11/19/2011   LDLCALC 90 11/19/2011   TRIG 139.0 11/19/2011    Radiology Studies    Dg Chest 2 View  Result Date: 09/27/2016 CLINICAL DATA:  Shortness of breath and weakness. EXAM: CHEST  2 VIEW COMPARISON:  01/13/2015 FINDINGS: Stable position of the left cardiac dual-chamber ICD. Heart size is within normal limits and stable. Median sternotomy wires are noted. Both lungs are clear without pulmonary edema. No pleural effusions. No acute bone abnormality. IMPRESSION: No active cardiopulmonary disease. Electronically Signed   By: Markus Daft M.D.   On: 09/27/2016 12:59   Ct Head Wo Contrast  Result Date: 09/27/2016 CLINICAL DATA:  Dizziness and blurred vision. Dizzy spell yesterday falling. Subsequent headache and neck pain. EXAM: CT HEAD WITHOUT CONTRAST CT CERVICAL SPINE WITHOUT CONTRAST TECHNIQUE: Multidetector CT imaging of the head and cervical spine was performed following the standard protocol without intravenous contrast. Multiplanar CT image reconstructions of the cervical spine were also generated. COMPARISON:  03/28/2012.  02/16/2010. FINDINGS: CT HEAD FINDINGS Brain: Normal without evidence of accelerated atrophy, old or acute infarction, mass lesion, hemorrhage, hydrocephalus or extra-axial collection. Vascular: There is atherosclerotic calcification of the major vessels at the base of the brain. This is particularly notable affecting the left vertebral artery. Skull: Normal Sinuses/Orbits: Clear/normal Other: None significant CT CERVICAL SPINE FINDINGS Alignment: Normal Skull base and vertebrae: Normal.  No fracture. No traumatic finding. Soft tissues and  spinal canal: No significant finding. Disc levels:  C1-2: Ordinary mild osteoarthritis. C2-3: Facet degeneration on the left. Mild bony foraminal narrowing on the left. C3-4: Right-sided predominant facet arthropathy. Endplate osteophytes. Foraminal stenosis right more than left. C4-5:  Bilateral facet arthropathy.  Bilateral foraminal stenosis. C5-6:  Previous ACDF.  Solid union. C6-7: Degenerative spondylosis with foraminal stenosis left worse than right. C7-T1: No significant finding. Upper chest: Negative Other: None significant IMPRESSION: Head CT: No acute or traumatic finding. Atherosclerotic calcification noted, particularly affecting the left vertebral artery. Cervical spine CT: No acute or traumatic finding. Previous ACDF C5-6. Degenerative changes at the other levels as outlined above. Foraminal stenosis on the left at C6-7 particularly pronounced. Electronically Signed   By: Nelson Chimes M.D.   On: 09/27/2016 15:13   Ct Cervical Spine Wo Contrast  Result Date: 09/27/2016 CLINICAL DATA:  Dizziness and blurred vision. Dizzy spell yesterday falling. Subsequent headache and neck pain. EXAM: CT HEAD WITHOUT CONTRAST CT CERVICAL SPINE WITHOUT CONTRAST TECHNIQUE: Multidetector CT imaging of the head and cervical spine was performed following the standard protocol without intravenous contrast. Multiplanar CT image reconstructions of the cervical spine were also generated. COMPARISON:  03/28/2012.  02/16/2010. FINDINGS: CT HEAD FINDINGS Brain: Normal without evidence of accelerated atrophy, old or acute infarction, mass lesion, hemorrhage, hydrocephalus or extra-axial collection. Vascular: There is atherosclerotic calcification of the major vessels at the base of the brain. This is particularly notable affecting the left vertebral artery. Skull: Normal Sinuses/Orbits: Clear/normal Other: None significant CT CERVICAL SPINE FINDINGS Alignment: Normal Skull base and vertebrae: Normal. No fracture. No traumatic  finding. Soft tissues and spinal canal: No significant finding. Disc levels:  C1-2: Ordinary mild osteoarthritis. C2-3: Facet degeneration on the left. Mild bony foraminal narrowing on the left. C3-4: Right-sided predominant facet arthropathy. Endplate osteophytes. Foraminal stenosis right more than left. C4-5:  Bilateral facet arthropathy.  Bilateral foraminal stenosis. C5-6:  Previous ACDF.  Solid union. C6-7: Degenerative spondylosis with foraminal stenosis left worse than right. C7-T1: No significant finding. Upper chest: Negative Other: None significant IMPRESSION: Head CT: No acute or traumatic finding. Atherosclerotic calcification noted, particularly affecting the left vertebral artery. Cervical spine CT: No acute or traumatic finding. Previous ACDF C5-6. Degenerative changes at the other levels as outlined above. Foraminal stenosis on the left at C6-7 particularly pronounced. Electronically Signed   By: Nelson Chimes M.D.   On: 09/27/2016 15:13    ECG & Cardiac Imaging    Atrial flutter, 135, RAD, LPFB.  Assessment & Plan    1.  Atrial Flutter:  Pt presented to the ED on 10/16 with a two day h/o dizziness, palpitations, and chest pressure.  He was found to be in atrial flutter @ a rate of 135, which is a new dx for him. His device has been interrogated by [facility] and showed onset of atrial arrhythmia beginning on 10/14 @ 19:45.  He has been on coreg @ home.  Resume with prn IV lopressor.  Lovenox ordered.  We will add coumadin in the setting of CHA2DS2VASc = 5.  We've chosen coumadin over a DOAC in the setting of prior MVP s/p MV annuloplasty with mod MR and sev dil LA by echo in 12/2014.  With h/o LV dysfxn, he is not likely to tolerate this rhythm/tachycardia long term, and thus we'll keep him NPO for possible TEE/DCCV in the AM.  2.  CAD:  S/p multiple interventions with the most recent  occurring in 01/2015   PCI/DES to the [place] [facility].  He did have constant chest pressure over the weekend but is  currently feeling better.  Troponin was 0.03 in ED.  Follow troponin.  Suspect any mild elevation would be reflective of demand ischemia in the setting of #1.  Cont  blocker, statin.  He has been on ASA/Plavix but ran out of plavix about three wks ago.  Can resume plavix and d/c ASA in setting of coumadin initiation vs d/c plavix as he is now > 12 months out since last PCI.  Can ask interventional cardiology in AM which route they might prefer.  3.  Chronic combined systolic and diastolic CHF/ICM:  EF 72-07% by echo in 12/2014.  Euvolemic today.  Plan to f/u LV fxn in setting of TEE this admission.  He had been on coreg 25 BID @ home.  Currently written for metoprolol.  Resume coreg.  Hold ARB and spiro (on @ home) in setting of AKI.  4.  AKI/Stage II - III CKD:  Creat up on admission - 1.69 - up from 1.3 in 01/2015.  Follow.  ARB/Spiro on hold.  5.  HTN:  BP initially elevated but has since come down.  Cont  blocker.  Hold ARB/spiro as above.  6. HL:  Cont statin therapy.  Signed, Murray Hodgkins, NP 09/27/2016, 6:47 PM

## 2016-09-27 NOTE — ED Notes (Signed)
I-stat lactic acid 2.29 notified Dr Ashok Cordia

## 2016-09-27 NOTE — ED Notes (Signed)
St. [name] rep. Is coming to interrogate pacemaker

## 2016-09-28 ENCOUNTER — Encounter (HOSPITAL_COMMUNITY): Payer: Self-pay | Admitting: *Deleted

## 2016-09-28 ENCOUNTER — Inpatient Hospital Stay (HOSPITAL_COMMUNITY): Payer: Commercial Managed Care - HMO

## 2016-09-28 DIAGNOSIS — N183 Chronic kidney disease, stage 3 (moderate): Secondary | ICD-10-CM

## 2016-09-28 DIAGNOSIS — I10 Essential (primary) hypertension: Secondary | ICD-10-CM

## 2016-09-28 DIAGNOSIS — I5042 Chronic combined systolic (congestive) and diastolic (congestive) heart failure: Secondary | ICD-10-CM

## 2016-09-28 DIAGNOSIS — E785 Hyperlipidemia, unspecified: Secondary | ICD-10-CM

## 2016-09-28 DIAGNOSIS — I5022 Chronic systolic (congestive) heart failure: Secondary | ICD-10-CM

## 2016-09-28 LAB — GLUCOSE, CAPILLARY
GLUCOSE-CAPILLARY: 215 mg/dL — AB (ref 65–99)
GLUCOSE-CAPILLARY: 210 mg/dL — AB (ref 65–99)
Glucose-Capillary: 170 mg/dL — ABNORMAL HIGH (ref 65–99)
Glucose-Capillary: 183 mg/dL — ABNORMAL HIGH (ref 65–99)

## 2016-09-28 LAB — BASIC METABOLIC PANEL
ANION GAP: 7 (ref 5–15)
BUN: 25 mg/dL — ABNORMAL HIGH (ref 6–20)
CO2: 21 mmol/L — ABNORMAL LOW (ref 22–32)
Calcium: 8.3 mg/dL — ABNORMAL LOW (ref 8.9–10.3)
Chloride: 108 mmol/L (ref 101–111)
Creatinine, Ser: 1.41 mg/dL — ABNORMAL HIGH (ref 0.61–1.24)
GFR calc Af Amer: 58 mL/min — ABNORMAL LOW (ref >60)
GFR, EST NON AFRICAN AMERICAN: 50 mL/min — AB (ref >60)
GLUCOSE: 226 mg/dL — AB (ref 65–99)
POTASSIUM: 3.6 mmol/L (ref 3.5–5.1)
Sodium: 136 mmol/L (ref 135–145)

## 2016-09-28 LAB — HEMOGLOBIN A1C
Hgb A1c MFr Bld: 12.8 % — ABNORMAL HIGH (ref 4.8–5.6)
MEAN PLASMA GLUCOSE: 321 mg/dL

## 2016-09-28 LAB — CBC
HEMATOCRIT: 37.6 % — AB (ref 39.0–52.0)
HEMOGLOBIN: 13.1 g/dL (ref 13.0–17.0)
MCH: 30.1 pg (ref 26.0–34.0)
MCHC: 34.8 g/dL (ref 30.0–36.0)
MCV: 86.4 fL (ref 78.0–100.0)
Platelets: 189 10*3/uL (ref 150–400)
RBC: 4.35 MIL/uL (ref 4.22–5.81)
RDW: 12.9 % (ref 11.5–15.5)
WBC: 6.2 10*3/uL (ref 4.0–10.5)

## 2016-09-28 LAB — TROPONIN I
TROPONIN I: 0.03 ng/mL — AB (ref <0.03)
Troponin I: 0.03 ng/mL (ref <0.03)

## 2016-09-28 LAB — PROTIME-INR
INR: 1.22
Prothrombin Time: 15.5 seconds — ABNORMAL HIGH (ref 11.4–15.2)

## 2016-09-28 MED ORDER — AMIODARONE HCL IN DEXTROSE 360-4.14 MG/200ML-% IV SOLN
INTRAVENOUS | Status: AC
  Filled 2016-09-28: qty 200

## 2016-09-28 MED ORDER — AMIODARONE HCL IN DEXTROSE 360-4.14 MG/200ML-% IV SOLN
60.0000 mg/h | INTRAVENOUS | Status: DC
Start: 2016-09-28 — End: 2016-09-28
  Administered 2016-09-28: 60 mg/h via INTRAVENOUS
  Filled 2016-09-28: qty 200

## 2016-09-28 MED ORDER — DIGOXIN 0.25 MG/ML IJ SOLN: 0.1250 mg | Freq: Once | INTRAMUSCULAR | Status: DC

## 2016-09-28 MED ORDER — CARVEDILOL 25 MG PO TABS
25.0000 mg | ORAL_TABLET | Freq: Two times a day (BID) | ORAL | Status: DC
  Administered 2016-09-29 – 2016-09-30 (×2): 25 mg via ORAL
  Filled 2016-09-28 (×2): qty 1

## 2016-09-28 MED ORDER — SODIUM CHLORIDE 0.9 % IV SOLN: INTRAVENOUS | Status: DC

## 2016-09-28 MED ORDER — SODIUM CHLORIDE 0.9% FLUSH
3.0000 mL | Freq: Two times a day (BID) | INTRAVENOUS | Status: DC
Start: 2016-09-28 — End: 2016-09-28

## 2016-09-28 MED ORDER — SODIUM CHLORIDE 0.9 % IV SOLN
250.0000 mL | INTRAVENOUS | Status: DC
Start: 2016-09-28 — End: 2016-09-28

## 2016-09-28 MED ORDER — SODIUM CHLORIDE 0.9 % IV SOLN
INTRAVENOUS | Status: DC
  Administered 2016-09-28 – 2016-09-29 (×3): via INTRAVENOUS

## 2016-09-28 MED ORDER — AMIODARONE HCL IN DEXTROSE 360-4.14 MG/200ML-% IV SOLN
30.0000 mg/h | INTRAVENOUS | Status: DC
  Administered 2016-09-28 – 2016-09-30 (×5): 30 mg/h via INTRAVENOUS
  Filled 2016-09-28 (×3): qty 200

## 2016-09-28 MED ORDER — SODIUM CHLORIDE 0.9% FLUSH: 3.0000 mL | INTRAVENOUS | Status: DC | PRN

## 2016-09-28 MED ORDER — WARFARIN SODIUM 7.5 MG PO TABS
7.5000 mg | ORAL_TABLET | Freq: Once | ORAL | Status: AC
  Administered 2016-09-28: 7.5 mg via ORAL
  Filled 2016-09-28: qty 1

## 2016-09-28 NOTE — Progress Notes (Signed)
Patient Name: [name] [name] Date of Encounter: 09/28/2016  Primary Cardiologist: Dr. Sande Rives Problem List     Active Problems:   Automatic implantable cardioverter-defibrillator in situ   DM (diabetes mellitus), type 2 (HCC)   Atrial flutter with rapid ventricular response (HCC)   Dizziness   Chest pain   Shortness of breath   S/P coronary artery stent placement     Subjective   No complaints of chest pain or SOB only weakness and dizziness.    Inpatient Medications    Scheduled Meds: . amiodarone      . aspirin EC  81 mg Oral Daily  . atorvastatin  10 mg Oral Daily  . carvedilol  25 mg Oral BID WC  . coumadin book   Does not apply Once  . enoxaparin (LOVENOX) injection  100 mg Subcutaneous Q12H  . FLUoxetine  40 mg Oral Daily  . Influenza vac split quadrivalent PF  0.5 mL Intramuscular Tomorrow-1000  . insulin aspart  0-15 Units Subcutaneous TID WC  . insulin aspart  0-5 Units Subcutaneous QHS  . insulin detemir  50 Units Subcutaneous QHS  . linagliptin  5 mg Oral Daily  . metoprolol  5 mg Intravenous Once  . pantoprazole  40 mg Oral Daily  . tamsulosin  0.4 mg Oral Daily  . warfarin  7.5 mg Oral ONCE-1800  . Warfarin - Pharmacist Dosing Inpatient   Does not apply q1800   Continuous Infusions: . amiodarone 30 mg/hr (09/28/16 0655)   PRN Meds: acetaminophen, metoprolol, ondansetron (ZOFRAN) IV   Vital Signs    Vitals:   09/28/16 0217 09/28/16 0355 09/28/16 0813 09/28/16 0814  BP: 92/60 (!) 100/58  (!) 100/59  Pulse: (!) 115 88  (!) 115  Resp:  18    Temp:  97.8 F (36.6 C)    TempSrc:  Oral    SpO2:  97% 98%   Weight:  224 lb (101.6 kg)    Height:        Intake/Output Summary (Last 24 hours) at 09/28/16 1113 Last data filed at 09/28/16 0811  Gross per 24 hour  Intake            252.5 ml  Output              950 ml  Net           -697.5 ml   Filed Weights   09/27/16 1715 09/27/16 1745 09/28/16 0355  Weight: 223 lb 12.8 oz  (101.5 kg) 223 lb 12.8 oz (101.5 kg) 224 lb (101.6 kg)    Physical Exam   GEN: Well nourished, well developed, in no acute distress.  HEENT: Grossly normal.  Neck: Supple, no JVD Cardiac: irreg irreg no murmurs, rubs, or gallops. No clubbing, cyanosis, edema.  Radials2+ and equal bilaterally.  Respiratory:  Respirations regular and unlabored, clear to auscultation bilaterally. GI: Soft, nontender, nondistended, BS + x 4. MS: no deformity or atrophy. Skin: warm and dry Neuro: AAOx3 MAE,  Follows commands Psych:   Normal affect.  Labs    CBC  Recent Labs  09/27/16 1823 09/28/16 0546  WBC 8.3 6.2  HGB 14.7 13.1  HCT 41.0 37.6*  MCV 85.2 86.4  PLT 222 035   Basic Metabolic Panel  Recent Labs  09/27/16 1449 09/27/16 1823 09/28/16 0546  NA 132*  --  136  K 4.4  --  3.6  CL 103  --  108  CO2 22  --  21*  GLUCOSE 462*  --  226*  BUN 25*  --  25*  CREATININE 1.67*  --  1.41*  CALCIUM 8.6*  --  8.3*  MG  --  2.0  --    Liver Function Tests  Recent Labs  09/27/16 1331  AST 22  ALT 24  ALKPHOS 90  BILITOT 1.2  PROT 7.0  ALBUMIN 4.1   No results for input(s): LIPASE, AMYLASE in the last 72 hours. Cardiac Enzymes  Recent Labs  09/27/16 1823 09/27/16 2333 09/28/16 0546  TROPONINI 0.03* 0.03* 0.03*   BNP Invalid input(s): POCBNP D-Dimer No results for input(s): DDIMER in the last 72 hours. Hemoglobin A1C  Recent Labs  09/27/16 1824  HGBA1C 12.8*   Fasting Lipid Panel No results for input(s): CHOL, HDL, LDLCALC, TRIG, CHOLHDL, LDLDIRECT in the last 72 hours. Thyroid Function Tests  Recent Labs  09/27/16 1824  TSH 2.313    Telemetry    A fib rate up to 140s early AM now 120s but improved control - Personally Reviewed  ECG    A fib with RVR incomplete RBBB - Personally Reviewed  Radiology    Dg Chest 2 View  Result Date: 09/27/2016 CLINICAL DATA:  Shortness of breath and weakness. EXAM: CHEST  2 VIEW COMPARISON:  01/13/2015 FINDINGS:  Stable position of the left cardiac dual-chamber ICD. Heart size is within normal limits and stable. Median sternotomy wires are noted. Both lungs are clear without pulmonary edema. No pleural effusions. No acute bone abnormality. IMPRESSION: No active cardiopulmonary disease. Electronically Signed   By: Markus Daft M.D.   On: 09/27/2016 12:59   Ct Head Wo Contrast  Result Date: 09/27/2016 CLINICAL DATA:  Dizziness and blurred vision. Dizzy spell yesterday falling. Subsequent headache and neck pain. EXAM: CT HEAD WITHOUT CONTRAST CT CERVICAL SPINE WITHOUT CONTRAST TECHNIQUE: Multidetector CT imaging of the head and cervical spine was performed following the standard protocol without intravenous contrast. Multiplanar CT image reconstructions of the cervical spine were also generated. COMPARISON:  03/28/2012.  02/16/2010. FINDINGS: CT HEAD FINDINGS Brain: Normal without evidence of accelerated atrophy, old or acute infarction, mass lesion, hemorrhage, hydrocephalus or extra-axial collection. Vascular: There is atherosclerotic calcification of the major vessels at the base of the brain. This is particularly notable affecting the left vertebral artery. Skull: Normal Sinuses/Orbits: Clear/normal Other: None significant CT CERVICAL SPINE FINDINGS Alignment: Normal Skull base and vertebrae: Normal. No fracture. No traumatic finding. Soft tissues and spinal canal: No significant finding. Disc levels:  C1-2: Ordinary mild osteoarthritis. C2-3: Facet degeneration on the left. Mild bony foraminal narrowing on the left. C3-4: Right-sided predominant facet arthropathy. Endplate osteophytes. Foraminal stenosis right more than left. C4-5:  Bilateral facet arthropathy.  Bilateral foraminal stenosis. C5-6:  Previous ACDF.  Solid union. C6-7: Degenerative spondylosis with foraminal stenosis left worse than right. C7-T1: No significant finding. Upper chest: Negative Other: None significant IMPRESSION: Head CT: No acute or  traumatic finding. Atherosclerotic calcification noted, particularly affecting the left vertebral artery. Cervical spine CT: No acute or traumatic finding. Previous ACDF C5-6. Degenerative changes at the other levels as outlined above. Foraminal stenosis on the left at C6-7 particularly pronounced. Electronically Signed   By: Nelson Chimes M.D.   On: 09/27/2016 15:13   Ct Cervical Spine Wo Contrast  Result Date: 09/27/2016 CLINICAL DATA:  Dizziness and blurred vision. Dizzy spell yesterday falling. Subsequent headache and neck pain. EXAM: CT HEAD WITHOUT CONTRAST CT CERVICAL SPINE WITHOUT CONTRAST TECHNIQUE: Multidetector CT imaging of the  head and cervical spine was performed following the standard protocol without intravenous contrast. Multiplanar CT image reconstructions of the cervical spine were also generated. COMPARISON:  03/28/2012.  02/16/2010. FINDINGS: CT HEAD FINDINGS Brain: Normal without evidence of accelerated atrophy, old or acute infarction, mass lesion, hemorrhage, hydrocephalus or extra-axial collection. Vascular: There is atherosclerotic calcification of the major vessels at the base of the brain. This is particularly notable affecting the left vertebral artery. Skull: Normal Sinuses/Orbits: Clear/normal Other: None significant CT CERVICAL SPINE FINDINGS Alignment: Normal Skull base and vertebrae: Normal. No fracture. No traumatic finding. Soft tissues and spinal canal: No significant finding. Disc levels:  C1-2: Ordinary mild osteoarthritis. C2-3: Facet degeneration on the left. Mild bony foraminal narrowing on the left. C3-4: Right-sided predominant facet arthropathy. Endplate osteophytes. Foraminal stenosis right more than left. C4-5:  Bilateral facet arthropathy.  Bilateral foraminal stenosis. C5-6:  Previous ACDF.  Solid union. C6-7: Degenerative spondylosis with foraminal stenosis left worse than right. C7-T1: No significant finding. Upper chest: Negative Other: None significant  IMPRESSION: Head CT: No acute or traumatic finding. Atherosclerotic calcification noted, particularly affecting the left vertebral artery. Cervical spine CT: No acute or traumatic finding. Previous ACDF C5-6. Degenerative changes at the other levels as outlined above. Foraminal stenosis on the left at C6-7 particularly pronounced. Electronically Signed   By: Nelson Chimes M.D.   On: 09/27/2016 15:13   US Scrotum  Result Date: 09/28/2016 CLINICAL DATA:  Testicular pain for several years, left-greater-than-right, diabetes, high. EXAM: ULTRASOUND OF SCROTUM TECHNIQUE: Complete ultrasound examination of the testicles, epididymis, and other scrotal structures was performed. COMPARISON:  None. FINDINGS: Right testicle Measurements: The right testicle measures 3.4 x 1.6 x 2.6 cm. No intratesticular abnormality is seen. Blood flow is demonstrated to the right testicle with arterial and venous waveforms. Left testicle Measurements: The left testicle measures 3.4 x 1.7 x 2.5 cm. No intratesticular abnormality is seen. Blood flow is demonstrated to the left testicle with arterial and venous waveforms. Right epididymis:  The right epididymis is unremarkable. Left epididymis:  The left epididymis also is unremarkable. Hydrocele:  A small amount of fluid is noted on the right. 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 A small extratesticular calcification is present of 9 mm on the left probably representing a scrotal pearl. IMPRESSION: 1. Bilateral varicoceles. 2. No intratesticular abnormality. Blood flow is demonstrated to both testicles. 3. Small amount of fluid on the right. 4. Question of small 9 mm left scrotal pearl. Electronically Signed   By: Ivar Drape M.D.   On: 09/28/2016 09:35   [place] Art/ven Flow Abd Pelv Doppler  Result Date: 09/28/2016 CLINICAL DATA:  Testicular pain for several years, left-greater-than-right, diabetes, high. EXAM: ULTRASOUND OF SCROTUM  TECHNIQUE: Complete ultrasound examination of the testicles, epididymis, and other scrotal structures was performed. COMPARISON:  None. FINDINGS: Right testicle Measurements: The right testicle measures 3.4 x 1.6 x 2.6 cm. No intratesticular abnormality is seen. Blood flow is demonstrated to the right testicle with arterial and venous waveforms. Left testicle Measurements: The left testicle measures 3.4 x 1.7 x 2.5 cm. No intratesticular abnormality is seen. Blood flow is demonstrated to the left testicle with arterial and venous waveforms. Right epididymis:  The right epididymis is unremarkable. Left epididymis:  The left epididymis also is unremarkable. Hydrocele:  A small amount of fluid is noted on the right. 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 A small extratesticular calcification is present of 9 mm on  the left probably representing a scrotal pearl. IMPRESSION: 1. Bilateral varicoceles. 2. No intratesticular abnormality. Blood flow is demonstrated to both testicles. 3. Small amount of fluid on the right. 4. Question of small 9 mm left scrotal pearl. Electronically Signed   By: Ivar Drape M.D.   On: 09/28/2016 09:35    Cardiac Studies   None yet  Patient Profile     65 y/o ? with a h/o CAD, ICM s/p ICD, chronic combined CHF, MVP s/p annuloplasty in 2004, HTN, HL, DM II, and GERD, who was admitted 10/16 secondary to development of palpitations, chest tightness, and dizziness beginning on 10/14, and found to be atypical atrial flutter  Assessment & Plan    1. Atrial Flutter:  Pt presented to the ED on 10/16 with a two day h/o dizziness, palpitations, and chest pressure.  He was found to be in atrial flutter @ a rate of 135, which is a new dx for him. His ICD  device has been interrogated by [facility] and showed onset of atrial arrhythmia beginning on 10/14 @ 19:45.  He has been on coreg @ home.  Resume with prn IV lopressor.  Lovenox ordered.  We will add  coumadin in the setting of CHA2DS2VASc = 5.  We've chosen coumadin over a DOAC in the setting of prior MVP s/p MV annuloplasty with mod MR and sev dil LA by echo in 12/2014.  With h/o LV dysfxn, he is not likely to tolerate this rhythm/tachycardia long term, unable to have his TEE/DCCV today due to full schedule.  With hypotension last pm and increase of tachycardia amiodarone was started.  ? Leave drip or change to PO?  Will plan TEE/DCCV for AM and allow diet now.  2.  CAD:  S/p multiple interventions with the most recent occurring in 01/2015   PCI/DES to the [facility].  He did have constant chest pressure over the weekend but is currently feeling better.  Troponin was 0.03 in ED and repeat Troponins have been 0.03.   Cont ? blocker, statin.  He has been on ASA/Plavix but ran out of plavix about three wks ago.  d/c'd  plavix as he is now > 12 months out since last PCI and continued ASA with coumadin. .   3.  Chronic combined systolic and diastolic CHF/ICM:  EF 16-55% by echo in 12/2014.  Euvolemic today.  Plan to f/u LV fxn in setting of TEE this admission.  He had been on coreg 25 BID @ home.  Currently written for metoprolol.  Resume coreg.  Hold ARB and spiro (on @ home) in setting of AKI.  4.  AKI/Stage II - III CKD:  Creat up on admission - 1.69 - up from 1.3 in 01/2015.  today improved to 1.41.  ARB/Spiro on hold.  5.  HTN:  BP initially elevated but has since come down.  hypotension last evening to 85/63, fluid bolus was given. Cont ? blocker.  Hold ARB/spiro as above.   6. HL:  Cont statin therapy.  7 Testicular pain per IM.   Signed, Cecilie Kicks, NP  09/28/2016, 11:13 AM

## 2016-09-28 NOTE — Progress Notes (Signed)
[facility] for Lovenox/Coumadin Indication: atrial fibrillation  Allergies  Allergen Reactions  . Metformin And Related Nausea Only and Other (See Comments)    Loss of appetite  . Lisinopril Cough    Patient Measurements: Height: 6' (182.9 cm) Weight: 224 lb (101.6 kg) (scale a) IBW/kg (Calculated) : 77.6 Heparin Dosing Weight:   Vital Signs: Temp: 97.8 F (36.6 C) (10/17 0355) Temp Source: Oral (10/17 0355) BP: 100/59 (10/17 0814) Pulse Rate: 115 (10/17 0814)  Labs:  Recent Labs  09/27/16 1203 09/27/16 1331 09/27/16 1449 09/27/16 1823 09/27/16 2333 09/28/16 0546  HGB 15.7  --   --  14.7  --  13.1  HCT 43.4  --   --  41.0  --  37.6*  PLT 256  --   --  222  --  189  LABPROT  --  13.9  --   --   --  15.5*  INR  --  1.07  --   --   --  1.22  CREATININE 1.69*  --  1.67*  --   --  1.41*  TROPONINI  --   --   --  0.03* 0.03* 0.03*    Estimated Creatinine Clearance (by C-G formula based on SCr of 1.41 mg/dL (H)) Male: 51.6 mL/min Male: 62.7 mL/min   Medical History: Past Medical History:  Diagnosis Date  . AICD (automatic cardioverter/defibrillator) present   . Anxiety   . CAD (coronary artery disease)    a.  1993 s/p MI - [employer];  b. s/p BMS to LAD '00;  c. PTCA 2nd diagonal 2010;  d. 02/18/12 Cath: moderate nonobs dzs - med rx;  e.  01/2015 Cath: LM nl, LAD 40-57m ISR, 70-105m/d, d1 90p (3.0x16 Synergy DES), D2 50-60, LCX nl, OM1 50p, 89m (2.5x12 Synergy DES), RCA nl, EF 30-35%.  . Chronic combined systolic and diastolic CHF (congestive heart failure) ([place])    a. 12/2014 Echo: EF 30-35%, Gr2 DD, mod MR, sev dil LA.  Marland Kitchen Chronic ischemic heart disease   . CKD (chronic kidney disease), stage III   . Depression   . ED (erectile dysfunction)   . Episodic mood disorder ([place])   . GERD (gastroesophageal reflux disease)   . Heart murmur   . Hyperlipidemia   . Hypertension   . Ischemic cardiomyopathy    a. s/p St. [name]  (Atlas) ICD implanted in [place] 2007;  b. 12/2014 Echo: Ef 30-35%.  . MVP (mitral valve prolapse)    a. s/p MV annuloplasty at [facility] 2004.  Marland Kitchen PAF (paroxysmal atrial fibrillation) ([place])    a. noted on ICD interrogation '10 - not previously on [place] - CHA2DS2VASc = 5.  . Type II diabetes mellitus ([place])    uncontrolled    Medications:  Scheduled:  . amiodarone      . aspirin EC  81 mg Oral Daily  . atorvastatin  10 mg Oral Daily  . carvedilol  25 mg Oral BID WC  . coumadin book   Does not apply Once  . enoxaparin (LOVENOX) injection  100 mg Subcutaneous Q12H  . FLUoxetine  40 mg Oral Daily  . Influenza vac split quadrivalent PF  0.5 mL Intramuscular Tomorrow-1000  . insulin aspart  0-15 Units Subcutaneous TID WC  . insulin aspart  0-5 Units Subcutaneous QHS  . insulin detemir  50 Units Subcutaneous QHS  . linagliptin  5 mg Oral Daily  . metoprolol  5 mg Intravenous Once  .  pantoprazole  40 mg Oral Daily  . tamsulosin  0.4 mg Oral Daily  . Warfarin - Pharmacist Dosing Inpatient   Does not apply q1800    Assessment: 65yo male with new valvular afib, continuing on Lovenox + warfarin. INR 1.07 at baseline, up to 1.22 s/p 1 dose of warfarin. CBC wnl, CrCl>30. No bleed documented. Noted patient is on amiodarone - may affect INR.  Goal of Therapy:  Anti-Xa level 0.6-1 units/ml 4hrs after LMWH dose given  INR 2-3 Monitor platelets by anticoagulation protocol: Yes   Plan:  Lovenox 100mg  SQ q12h - d/c when INR therapeutic Coumadin 7.5mg  x 1 Daily INR, CBC q72h Monitor for s/sx bleeding  Elicia Lamp, PharmD, BCPS Clinical Pharmacist 09/28/2016 10:19 AM

## 2016-09-28 NOTE — Progress Notes (Signed)
PROGRESS NOTE    [name] [name]  QTM:[id number] DOB: 1951/04/03 DOA: 09/27/2016 PCP: Garwin Brothers, MD    Brief Narrative:  65 yo male with systolic heart failure (AICD) with hx of mitral valve repair. Presented with dizziness and weakness, worsening, limiting his ability to stand or walk. On initial physical examination he was found in flutter. Device interrogated and flutter in the ED. Arrhythmia suspected to be present for more than 48 hours. Placed on amiodarone drip and enoxaparin, plan for cardioversion.     Assessment & Plan:   Active Problems:   Automatic implantable cardioverter-defibrillator in situ   DM (diabetes mellitus), type 2 (HCC)   Atrial flutter with rapid ventricular response (HCC)   Dizziness   Chest pain   Shortness of breath   S/P coronary artery stent placement  1. Atrial flutter/ A FIb acute, new onset. Will continue amiodarone for rate and rhythm control, heart rate in the 103 range. Will continue anticoagulation with low molecular heparin/ warfarin, follow on TEE. Plan for cardioversion per cardiology team. No signs of volume overload. Off plavix. As needed IV metoprolol.   2, Heart failure systolic chronic. Clinically euvolemic, will continue telemetry monitoring, will continue carvedilol, holding ace inh to prevent worsening GFR or hypotension. Systolic blood pressure 625 to 107.   3. T2DM. Will continue glucose cover and monitoring with insulin sliding scale, serum glucose [phone]. Patient tolerating po well. Basal insulin 50 units of levimir. Continue tradjenta.   4. AKI. Renal function with cr at 1,4 from 1,69, will continue supportive care, will hold on valsartan and spironolactone. Follow renal panel in am, avoid hypotension or nephrotoxic medications.  5. Depression. Continue fluoxetine.   6. BPH. Continue flomax.     DVT prophylaxis: lovenox full anticoagulation  Code Status: full  Family Communication: No family at the bedside    Disposition Plan: home  Consultants:   Cardiology   Procedures:   Antimicrobials:    Subjective: Patient feeling better, but persistent weakness, no chest pain or dyspnea, no nausea or vomiting.   Objective: Vitals:   09/28/16 0217 09/28/16 0355 09/28/16 0813 09/28/16 0814  BP: 92/60 (!) 100/58  (!) 100/59  Pulse: (!) 115 88  (!) 115  Resp:  18    Temp:  97.8 F (36.6 C)    TempSrc:  Oral    SpO2:  97% 98%   Weight:  101.6 kg (224 lb)    Height:        Intake/Output Summary (Last 24 hours) at 09/28/16 0946 Last data filed at 09/28/16 0811  Gross per 24 hour  Intake            252.5 ml  Output              950 ml  Net           -697.5 ml   Filed Weights   09/27/16 1715 09/27/16 1745 09/28/16 0355  Weight: 101.5 kg (223 lb 12.8 oz) 101.5 kg (223 lb 12.8 oz) 101.6 kg (224 lb)    Examination:  General exam: not in pain or dyspnea E ENT. No conjunctival pallor, oral mucosa moist. Respiratory system: No wheezing, or rales, no rhonchi   Cardiovascular system: S1 & S2 heard, irregualr, RRR. No JVD, murmurs, rubs, gallops or clicks. No pedal edema. Gastrointestinal system: Abdomen is nondistended, soft and nontender. No organomegaly or masses felt. Normal bowel sounds heard. Central nervous system: Alert and oriented. No focal neurological deficits. Extremities: Symmetric 5 x  5 power. Skin: No rashes, lesions or ulcers     Data Reviewed: I have personally reviewed following labs and imaging studies  CBC:  Recent Labs Lab 09/27/16 1203 09/27/16 1823 09/28/16 0546  WBC 7.8 8.3 6.2  HGB 15.7 14.7 13.1  HCT 43.4 41.0 37.6*  MCV 85.3 85.2 86.4  PLT 256 222 416   Basic Metabolic Panel:  Recent Labs Lab 09/27/16 1203 09/27/16 1449 09/27/16 1823 09/28/16 0546  NA 132* 132*  --  136  K 4.5 4.4  --  3.6  CL 98* 103  --  108  CO2 20* 22  --  21*  GLUCOSE 521* 462*  --  226*  BUN 23* 25*  --  25*  CREATININE 1.69* 1.67*  --  1.41*  CALCIUM 9.3 8.6*  --   8.3*  MG  --   --  2.0  --    GFR: Estimated Creatinine Clearance (by C-G formula based on SCr of 1.41 mg/dL (H)) Male: 51.6 mL/min Male: 62.7 mL/min Liver Function Tests:  Recent Labs Lab 09/27/16 1331  AST 22  ALT 24  ALKPHOS 90  BILITOT 1.2  PROT 7.0  ALBUMIN 4.1   No results for input(s): LIPASE, AMYLASE in the last 168 hours. No results for input(s): AMMONIA in the last 168 hours. Coagulation Profile:  Recent Labs Lab 09/27/16 1331 09/28/16 0546  INR 1.07 1.22   Cardiac Enzymes:  Recent Labs Lab 09/27/16 1823 09/27/16 2333 09/28/16 0546  TROPONINI 0.03* 0.03* 0.03*   BNP (last 3 results) No results for input(s): PROBNP in the last 8760 hours. HbA1C:  Recent Labs  09/27/16 1824  HGBA1C 12.8*   CBG:  Recent Labs Lab 09/27/16 1751 09/27/16 2107 09/28/16 0556  GLUCAP 344* 316* 215*   Lipid Profile: No results for input(s): CHOL, HDL, LDLCALC, TRIG, CHOLHDL, LDLDIRECT in the last 72 hours. Thyroid Function Tests:  Recent Labs  09/27/16 1823 09/27/16 1824  TSH  --  2.313  FREET4 1.15*  --    Anemia Panel: No results for input(s): VITAMINB12, FOLATE, FERRITIN, TIBC, IRON, RETICCTPCT in the last 72 hours. Sepsis Labs:  Recent Labs Lab 09/27/16 1222 09/27/16 1511  LATICACIDVEN 2.29* 1.85    No results found for this or any previous visit (from the past 240 hour(s)).       Radiology Studies: Dg Chest 2 View  Result Date: 09/27/2016 CLINICAL DATA:  Shortness of breath and weakness. EXAM: CHEST  2 VIEW COMPARISON:  01/13/2015 FINDINGS: Stable position of the left cardiac dual-chamber ICD. Heart size is within normal limits and stable. Median sternotomy wires are noted. Both lungs are clear without pulmonary edema. No pleural effusions. No acute bone abnormality. IMPRESSION: No active cardiopulmonary disease. Electronically Signed   By: Markus Daft M.D.   On: 09/27/2016 12:59   Ct Head Wo Contrast  Result Date: 09/27/2016 CLINICAL  DATA:  Dizziness and blurred vision. Dizzy spell yesterday falling. Subsequent headache and neck pain. EXAM: CT HEAD WITHOUT CONTRAST CT CERVICAL SPINE WITHOUT CONTRAST TECHNIQUE: Multidetector CT imaging of the head and cervical spine was performed following the standard protocol without intravenous contrast. Multiplanar CT image reconstructions of the cervical spine were also generated. COMPARISON:  03/28/2012.  02/16/2010. FINDINGS: CT HEAD FINDINGS Brain: Normal without evidence of accelerated atrophy, old or acute infarction, mass lesion, hemorrhage, hydrocephalus or extra-axial collection. Vascular: There is atherosclerotic calcification of the major vessels at the base of the brain. This is particularly notable affecting the left vertebral artery.  Skull: Normal Sinuses/Orbits: Clear/normal Other: None significant CT CERVICAL SPINE FINDINGS Alignment: Normal Skull base and vertebrae: Normal. No fracture. No traumatic finding. Soft tissues and spinal canal: No significant finding. Disc levels:  C1-2: Ordinary mild osteoarthritis. C2-3: Facet degeneration on the left. Mild bony foraminal narrowing on the left. C3-4: Right-sided predominant facet arthropathy. Endplate osteophytes. Foraminal stenosis right more than left. C4-5:  Bilateral facet arthropathy.  Bilateral foraminal stenosis. C5-6:  Previous ACDF.  Solid union. C6-7: Degenerative spondylosis with foraminal stenosis left worse than right. C7-T1: No significant finding. Upper chest: Negative Other: None significant IMPRESSION: Head CT: No acute or traumatic finding. Atherosclerotic calcification noted, particularly affecting the left vertebral artery. Cervical spine CT: No acute or traumatic finding. Previous ACDF C5-6. Degenerative changes at the other levels as outlined above. Foraminal stenosis on the left at C6-7 particularly pronounced. Electronically Signed   By: Nelson Chimes M.D.   On: 09/27/2016 15:13   Ct Cervical Spine Wo Contrast  Result  Date: 09/27/2016 CLINICAL DATA:  Dizziness and blurred vision. Dizzy spell yesterday falling. Subsequent headache and neck pain. EXAM: CT HEAD WITHOUT CONTRAST CT CERVICAL SPINE WITHOUT CONTRAST TECHNIQUE: Multidetector CT imaging of the head and cervical spine was performed following the standard protocol without intravenous contrast. Multiplanar CT image reconstructions of the cervical spine were also generated. COMPARISON:  03/28/2012.  02/16/2010. FINDINGS: CT HEAD FINDINGS Brain: Normal without evidence of accelerated atrophy, old or acute infarction, mass lesion, hemorrhage, hydrocephalus or extra-axial collection. Vascular: There is atherosclerotic calcification of the major vessels at the base of the brain. This is particularly notable affecting the left vertebral artery. Skull: Normal Sinuses/Orbits: Clear/normal Other: None significant CT CERVICAL SPINE FINDINGS Alignment: Normal Skull base and vertebrae: Normal. No fracture. No traumatic finding. Soft tissues and spinal canal: No significant finding. Disc levels:  C1-2: Ordinary mild osteoarthritis. C2-3: Facet degeneration on the left. Mild bony foraminal narrowing on the left. C3-4: Right-sided predominant facet arthropathy. Endplate osteophytes. Foraminal stenosis right more than left. C4-5:  Bilateral facet arthropathy.  Bilateral foraminal stenosis. C5-6:  Previous ACDF.  Solid union. C6-7: Degenerative spondylosis with foraminal stenosis left worse than right. C7-T1: No significant finding. Upper chest: Negative Other: None significant IMPRESSION: Head CT: No acute or traumatic finding. Atherosclerotic calcification noted, particularly affecting the left vertebral artery. Cervical spine CT: No acute or traumatic finding. Previous ACDF C5-6. Degenerative changes at the other levels as outlined above. Foraminal stenosis on the left at C6-7 particularly pronounced. Electronically Signed   By: Nelson Chimes M.D.   On: 09/27/2016 15:13   US  Scrotum  Result Date: 09/28/2016 CLINICAL DATA:  Testicular pain for several years, left-greater-than-right, diabetes, high. EXAM: ULTRASOUND OF SCROTUM TECHNIQUE: Complete ultrasound examination of the testicles, epididymis, and other scrotal structures was performed. COMPARISON:  None. FINDINGS: Right testicle Measurements: The right testicle measures 3.4 x 1.6 x 2.6 cm. No intratesticular abnormality is seen. Blood flow is demonstrated to the right testicle with arterial and venous waveforms. Left testicle Measurements: The left testicle measures 3.4 x 1.7 x 2.5 cm. No intratesticular abnormality is seen. Blood flow is demonstrated to the left testicle with arterial and venous waveforms. Right epididymis:  The right epididymis is unremarkable. Left epididymis:  The left epididymis also is unremarkable. Hydrocele:  A small amount of fluid is noted on the right. 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 A small extratesticular calcification is present of 9 mm on the left probably representing a scrotal pearl.  IMPRESSION: 1. Bilateral varicoceles. 2. No intratesticular abnormality. Blood flow is demonstrated to both testicles. 3. Small amount of fluid on the right. 4. Question of small 9 mm left scrotal pearl. Electronically Signed   By: Ivar Drape M.D.   On: 09/28/2016 09:35   [place] Art/ven Flow Abd Pelv Doppler  Result Date: 09/28/2016 CLINICAL DATA:  Testicular pain for several years, left-greater-than-right, diabetes, high. EXAM: ULTRASOUND OF SCROTUM TECHNIQUE: Complete ultrasound examination of the testicles, epididymis, and other scrotal structures was performed. COMPARISON:  None. FINDINGS: Right testicle Measurements: The right testicle measures 3.4 x 1.6 x 2.6 cm. No intratesticular abnormality is seen. Blood flow is demonstrated to the right testicle with arterial and venous waveforms. Left testicle Measurements: The left testicle measures 3.4 x 1.7 x 2.5  cm. No intratesticular abnormality is seen. Blood flow is demonstrated to the left testicle with arterial and venous waveforms. Right epididymis:  The right epididymis is unremarkable. Left epididymis:  The left epididymis also is unremarkable. Hydrocele:  A small amount of fluid is noted on the right. 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 A small extratesticular calcification is present of 9 mm on the left probably representing a scrotal pearl. IMPRESSION: 1. Bilateral varicoceles. 2. No intratesticular abnormality. Blood flow is demonstrated to both testicles. 3. Small amount of fluid on the right. 4. Question of small 9 mm left scrotal pearl. Electronically Signed   By: Ivar Drape M.D.   On: 09/28/2016 09:35        Scheduled Meds: . amiodarone      . aspirin EC  81 mg Oral Daily  . atorvastatin  10 mg Oral Daily  . carvedilol  25 mg Oral BID WC  . coumadin book   Does not apply Once  . enoxaparin (LOVENOX) injection  100 mg Subcutaneous Q12H  . FLUoxetine  40 mg Oral Daily  . Influenza vac split quadrivalent PF  0.5 mL Intramuscular Tomorrow-1000  . insulin aspart  0-15 Units Subcutaneous TID WC  . insulin aspart  0-5 Units Subcutaneous QHS  . insulin detemir  50 Units Subcutaneous QHS  . linagliptin  5 mg Oral Daily  . metoprolol  5 mg Intravenous Once  . pantoprazole  40 mg Oral Daily  . tamsulosin  0.4 mg Oral Daily  . Warfarin - Pharmacist Dosing Inpatient   Does not apply q1800   Continuous Infusions: . sodium chloride 50 mL/hr at 09/27/16 1745  . amiodarone 30 mg/hr (09/28/16 0655)     LOS: 1 day      Mauricio Gerome Apley, MD [facility] Pager [phone]  If 7PM-7AM, please contact night-coverage [email or web address] Password [email or web address] 09/28/2016, 9:46 AM

## 2016-09-28 NOTE — Progress Notes (Addendum)
Inpatient Diabetes Program Recommendations  AACE/ADA: New Consensus Statement on Inpatient Glycemic Control (2015)  Target Ranges:  Prepandial:   less than 140 mg/dL      Peak postprandial:   less than 180 mg/dL (1-2 hours)      Critically ill patients:  140 - 180 mg/dL   Lab Results  Component Value Date   GLUCAP 215 (H) 09/28/2016   HGBA1C 12.8 (H) 09/27/2016    Review of Glycemic Control Results for NAFTULA, DONAHUE (MRN [id number]) as of 09/28/2016 10:14  Ref. Range 09/27/2016 17:51 09/27/2016 21:07 09/28/2016 05:56  Glucose-Capillary Latest Ref Range: 65 - 99 mg/dL 344 (H) 316 (H) 215 (H)   Diabetes history: DM2 Outpatient Diabetes medications: Levemir 48 units q hs + Januvia 100 mg qd Current orders for Inpatient glycemic control: Levemir 50 units hs + Tradjenta 5 mg + Novolog correction 0-15 units tid + 0-5 units hs  Inpatient Diabetes Program Recommendations:  Noted patient has been off of Levemir due to cost. Recommend transition home on 70/30 insulin 35 units ac bid    (would give 24.5 basal insulin bid). Patient would be able to give injections by syringe and purchase @ [facility] for approx. $25 per vial. Will speak with patient.  Thank you, Nani Gasser. Haly Feher, RN, MSN, CDE Inpatient Glycemic Control Team Team Pager [phone] (8am-5pm) 09/28/2016 10:20 AM

## 2016-09-29 ENCOUNTER — Inpatient Hospital Stay (HOSPITAL_COMMUNITY): Payer: Commercial Managed Care - HMO | Admitting: Anesthesiology

## 2016-09-29 ENCOUNTER — Inpatient Hospital Stay (HOSPITAL_COMMUNITY): Payer: Commercial Managed Care - HMO

## 2016-09-29 ENCOUNTER — Encounter (HOSPITAL_COMMUNITY)
Admission: EM | Disposition: A | Discharge status: Home / Self Care | Payer: Self-pay | Source: Home / Self Care | Attending: Internal Medicine

## 2016-09-29 ENCOUNTER — Encounter (HOSPITAL_COMMUNITY): Payer: Self-pay | Admitting: *Deleted

## 2016-09-29 DIAGNOSIS — E1165 Type 2 diabetes mellitus with hyperglycemia: Secondary | ICD-10-CM

## 2016-09-29 DIAGNOSIS — I4892 Unspecified atrial flutter: Secondary | ICD-10-CM

## 2016-09-29 DIAGNOSIS — R739 Hyperglycemia, unspecified: Secondary | ICD-10-CM

## 2016-09-29 DIAGNOSIS — I340 Nonrheumatic mitral (valve) insufficiency: Secondary | ICD-10-CM

## 2016-09-29 DIAGNOSIS — Z794 Long term (current) use of insulin: Secondary | ICD-10-CM

## 2016-09-29 HISTORY — PX: CARDIOVERSION: SHX1299

## 2016-09-29 HISTORY — PX: TEE WITHOUT CARDIOVERSION: SHX5443

## 2016-09-29 LAB — BASIC METABOLIC PANEL
ANION GAP: 6 (ref 5–15)
BUN: 24 mg/dL — ABNORMAL HIGH (ref 6–20)
CHLORIDE: 108 mmol/L (ref 101–111)
CO2: 24 mmol/L (ref 22–32)
Calcium: 8.4 mg/dL — ABNORMAL LOW (ref 8.9–10.3)
Creatinine, Ser: 1.49 mg/dL — ABNORMAL HIGH (ref 0.61–1.24)
GFR calc Af Amer: 54 mL/min — ABNORMAL LOW (ref >60)
GFR, EST NON AFRICAN AMERICAN: 47 mL/min — AB (ref >60)
Glucose, Bld: 197 mg/dL — ABNORMAL HIGH (ref 65–99)
POTASSIUM: 3.5 mmol/L (ref 3.5–5.1)
SODIUM: 138 mmol/L (ref 135–145)

## 2016-09-29 LAB — GLUCOSE, CAPILLARY
GLUCOSE-CAPILLARY: 187 mg/dL — AB (ref 65–99)
GLUCOSE-CAPILLARY: 103 mg/dL — AB (ref 65–99)
GLUCOSE-CAPILLARY: 319 mg/dL — AB (ref 65–99)
Glucose-Capillary: 161 mg/dL — ABNORMAL HIGH (ref 65–99)

## 2016-09-29 LAB — CBC WITH DIFFERENTIAL/PLATELET
BASOS ABS: 0 10*3/uL (ref 0.0–0.1)
Basophils Relative: 1 %
EOS PCT: 3 %
Eosinophils Absolute: 0.2 10*3/uL (ref 0.0–0.7)
HEMATOCRIT: 37.1 % — AB (ref 39.0–52.0)
Hemoglobin: 12.8 g/dL — ABNORMAL LOW (ref 13.0–17.0)
LYMPHS ABS: 1.6 10*3/uL (ref 0.7–4.0)
LYMPHS PCT: 25 %
MCH: 29.7 pg (ref 26.0–34.0)
MCHC: 34.5 g/dL (ref 30.0–36.0)
MCV: 86.1 fL (ref 78.0–100.0)
MONO ABS: 0.5 10*3/uL (ref 0.1–1.0)
MONOS PCT: 7 %
NEUTROS ABS: 4.2 10*3/uL (ref 1.7–7.7)
Neutrophils Relative %: 64 %
PLATELETS: 187 10*3/uL (ref 150–400)
RBC: 4.31 MIL/uL (ref 4.22–5.81)
RDW: 12.9 % (ref 11.5–15.5)
WBC: 6.5 10*3/uL (ref 4.0–10.5)

## 2016-09-29 LAB — PROTIME-INR
INR: 2.16
Prothrombin Time: 24.5 seconds — ABNORMAL HIGH (ref 11.4–15.2)

## 2016-09-29 LAB — MAGNESIUM: Magnesium: 2 mg/dL (ref 1.7–2.4)

## 2016-09-29 SURGERY — ECHOCARDIOGRAM, TRANSESOPHAGEAL
Anesthesia: Monitor Anesthesia Care

## 2016-09-29 MED ORDER — WARFARIN SODIUM 2.5 MG PO TABS
2.5000 mg | ORAL_TABLET | Freq: Once | ORAL | Status: AC
  Administered 2016-09-29: 2.5 mg via ORAL
  Filled 2016-09-29: qty 1

## 2016-09-29 MED ORDER — PROPOFOL 500 MG/50ML IV EMUL
INTRAVENOUS | Status: DC | PRN
  Administered 2016-09-29: 60 ug/kg/min via INTRAVENOUS

## 2016-09-29 MED ORDER — LIDOCAINE HCL (CARDIAC) 20 MG/ML IV SOLN
INTRAVENOUS | Status: DC | PRN
  Administered 2016-09-29: 60 mg via INTRAVENOUS

## 2016-09-29 MED ORDER — PROPOFOL 10 MG/ML IV BOLUS
INTRAVENOUS | Status: DC | PRN
  Administered 2016-09-29: 30 mg via INTRAVENOUS
  Administered 2016-09-29: 10 mg via INTRAVENOUS

## 2016-09-29 MED ORDER — BUTAMBEN-TETRACAINE-BENZOCAINE 2-2-14 % EX AERO
INHALATION_SPRAY | CUTANEOUS | Status: DC | PRN
  Administered 2016-09-29: 2 via TOPICAL

## 2016-09-29 NOTE — Progress Notes (Signed)
PROGRESS NOTE    [name] [name]  CXK:[id number] DOB: July 19, 1951 DOA: 09/27/2016 PCP: Garwin Brothers, MD   Chief Complaint  Patient presents with  . Weakness    Brief Narrative:  65 yo male with systolic heart failure (AICD) with hx of mitral valve repair. Presented with dizziness and weakness, worsening, limiting his ability to stand or walk. On initial physical examination he was found in flutter. Device interrogated and flutter in the ED. Arrhythmia suspected to be present for more than 48 hours. Placed on amiodarone drip and enoxaparin, plan for cardioversion.   Assessment & Plan   Paroxysmal Atrial flutter/fibrillation -It seems the patient did have atrial fibrillation noted on ICD check back in 2010 [facility] -Cardiology consulted and appreciated -Placed on amiodarone drip -Continue coreg (cardio may transition to metoprolol) -Pending TEE/cardioversion today  Chronic Systolic heart failure -EF noted to be 30-35% -Not on ACE inhibitor or ARB due to renal failure -Currently appears to be euvolemic -Monitor intake and output, daily weights  Coronary artery disease -Patient ran his Plavix approximately 3 weeks ago -Continue aspirin -Patient's troponin 0.03, likely related to demand ischemia  Essential hypertension -Currently on Coreg  Hyperlipidemia -Continue statin  Diabetes mellitus, type II -Continue tradjenta, insulin sliding scale, Levemir and CBG monitoring  Acute kidney injury vs CKD, stage III -Baseline creatinine naproxen 1.3, currently 1.49 -Valsartan and spironolactone held -Continue to monitor BMP  Depression -Continue fluoxetine   BPH -Continue Flomax  DVT Prophylaxis  Lovenox  Code Status: Full  Family Communication: None at bedside  Disposition Plan: Admitted. Pending TEE/DCCV today.  Consultants Cardiology  Procedures  US scrotum TEE  Antibiotics   Anti-infectives    None      Subjective:   [name] [name] seen and  examined today.  Patient states he is ready for his cardioversion today.  Feels everyone in the family has heart problems.  Denies chest pain, shortness of breath, abdominal pain, N/V/C/D, dizziness, headache.  Objective:   Vitals:   09/29/16 0137 09/29/16 0558 09/29/16 1008 09/29/16 1112  BP: 119/86 115/80 (!) 139/94 96/64  Pulse: 62 67 87 60  Resp: 17 16 13 13   Temp: 97.3 F (36.3 C) 97.5 F (36.4 C) 97.8 F (36.6 C) 97.7 F (36.5 C)  TempSrc: Oral Oral Oral Oral  SpO2: 99% 98% 100% 100%  Weight:  102.6 kg (226 lb 1.6 oz) 102.6 kg (226 lb 1.6 oz)   Height:   6' (1.829 m)     Intake/Output Summary (Last 24 hours) at 09/29/16 1138 Last data filed at 09/29/16 1057  Gross per 24 hour  Intake          1305.71 ml  Output              600 ml  Net           705.71 ml   Filed Weights   09/28/16 0355 09/29/16 0558 09/29/16 1008  Weight: 101.6 kg (224 lb) 102.6 kg (226 lb 1.6 oz) 102.6 kg (226 lb 1.6 oz)    Exam  General: Well developed, well nourished, NAD, appears stated age  HEENT: NCAT, mucous membranes moist.   Neck: Supple, no JVD, no masses  Cardiovascular: irregularly irregular, no murmurs  Respiratory: Clear to auscultation bilaterally with equal chest rise  Abdomen: Soft, nontender, nondistended, + bowel sounds  Extremities: warm dry without cyanosis clubbing or edema  Neuro: AAOx3, nonfocal  Psych: Normal affect and demeanor with intact judgement and insight   Data Reviewed: I  have personally reviewed following labs and imaging studies  CBC:  Recent Labs Lab 09/27/16 1203 09/27/16 1823 09/28/16 0546 09/29/16 0325  WBC 7.8 8.3 6.2 6.5  NEUTROABS  --   --   --  4.2  HGB 15.7 14.7 13.1 12.8*  HCT 43.4 41.0 37.6* 37.1*  MCV 85.3 85.2 86.4 86.1  PLT 256 222 189 528   Basic Metabolic Panel:  Recent Labs Lab 09/27/16 1203 09/27/16 1449 09/27/16 1823 09/28/16 0546 09/29/16 0325  NA 132* 132*  --  136 138  K 4.5 4.4  --  3.6 3.5  CL 98* 103   --  108 108  CO2 20* 22  --  21* 24  GLUCOSE 521* 462*  --  226* 197*  BUN 23* 25*  --  25* 24*  CREATININE 1.69* 1.67*  --  1.41* 1.49*  CALCIUM 9.3 8.6*  --  8.3* 8.4*  MG  --   --  2.0  --  2.0   GFR: Estimated Creatinine Clearance (by C-G formula based on SCr of 1.49 mg/dL (H)) Male: 49.1 mL/min Male: 59.6 mL/min Liver Function Tests:  Recent Labs Lab 09/27/16 1331  AST 22  ALT 24  ALKPHOS 90  BILITOT 1.2  PROT 7.0  ALBUMIN 4.1   No results for input(s): LIPASE, AMYLASE in the last 168 hours. No results for input(s): AMMONIA in the last 168 hours. Coagulation Profile:  Recent Labs Lab 09/27/16 1331 09/28/16 0546 09/29/16 0325  INR 1.07 1.22 2.16   Cardiac Enzymes:  Recent Labs Lab 09/27/16 1823 09/27/16 2333 09/28/16 0546  TROPONINI 0.03* 0.03* 0.03*   BNP (last 3 results) No results for input(s): PROBNP in the last 8760 hours. HbA1C:  Recent Labs  09/27/16 1824  HGBA1C 12.8*   CBG:  Recent Labs Lab 09/28/16 0556 09/28/16 1237 09/28/16 1643 09/28/16 2129 09/29/16 0549  GLUCAP 215* 170* 210* 183* 187*   Lipid Profile: No results for input(s): CHOL, HDL, LDLCALC, TRIG, CHOLHDL, LDLDIRECT in the last 72 hours. Thyroid Function Tests:  Recent Labs  09/27/16 1823 09/27/16 1824  TSH  --  2.313  FREET4 1.15*  --    Anemia Panel: No results for input(s): VITAMINB12, FOLATE, FERRITIN, TIBC, IRON, RETICCTPCT in the last 72 hours. Urine analysis: No results found for: COLORURINE, APPEARANCEUR, LABSPEC, PHURINE, GLUCOSEU, HGBUR, BILIRUBINUR, KETONESUR, PROTEINUR, UROBILINOGEN, NITRITE, LEUKOCYTESUR Sepsis Labs: @LABRCNTIP (procalcitonin:4,lacticidven:4)  )No results found for this or any previous visit (from the past 240 hour(s)).    Radiology Studies: Dg Chest 2 View  Result Date: 09/27/2016 CLINICAL DATA:  Shortness of breath and weakness. EXAM: CHEST  2 VIEW COMPARISON:  01/13/2015 FINDINGS: Stable position of the left cardiac  dual-chamber ICD. Heart size is within normal limits and stable. Median sternotomy wires are noted. Both lungs are clear without pulmonary edema. No pleural effusions. No acute bone abnormality. IMPRESSION: No active cardiopulmonary disease. Electronically Signed   By: Markus Daft M.D.   On: 09/27/2016 12:59   Ct Head Wo Contrast  Result Date: 09/27/2016 CLINICAL DATA:  Dizziness and blurred vision. Dizzy spell yesterday falling. Subsequent headache and neck pain. EXAM: CT HEAD WITHOUT CONTRAST CT CERVICAL SPINE WITHOUT CONTRAST TECHNIQUE: Multidetector CT imaging of the head and cervical spine was performed following the standard protocol without intravenous contrast. Multiplanar CT image reconstructions of the cervical spine were also generated. COMPARISON:  03/28/2012.  02/16/2010. FINDINGS: CT HEAD FINDINGS Brain: Normal without evidence of accelerated atrophy, old or acute infarction, mass lesion, hemorrhage, hydrocephalus or  extra-axial collection. Vascular: There is atherosclerotic calcification of the major vessels at the base of the brain. This is particularly notable affecting the left vertebral artery. Skull: Normal Sinuses/Orbits: Clear/normal Other: None significant CT CERVICAL SPINE FINDINGS Alignment: Normal Skull base and vertebrae: Normal. No fracture. No traumatic finding. Soft tissues and spinal canal: No significant finding. Disc levels:  C1-2: Ordinary mild osteoarthritis. C2-3: Facet degeneration on the left. Mild bony foraminal narrowing on the left. C3-4: Right-sided predominant facet arthropathy. Endplate osteophytes. Foraminal stenosis right more than left. C4-5:  Bilateral facet arthropathy.  Bilateral foraminal stenosis. C5-6:  Previous ACDF.  Solid union. C6-7: Degenerative spondylosis with foraminal stenosis left worse than right. C7-T1: No significant finding. Upper chest: Negative Other: None significant IMPRESSION: Head CT: No acute or traumatic finding. Atherosclerotic  calcification noted, particularly affecting the left vertebral artery. Cervical spine CT: No acute or traumatic finding. Previous ACDF C5-6. Degenerative changes at the other levels as outlined above. Foraminal stenosis on the left at C6-7 particularly pronounced. Electronically Signed   By: Nelson Chimes M.D.   On: 09/27/2016 15:13   Ct Cervical Spine Wo Contrast  Result Date: 09/27/2016 CLINICAL DATA:  Dizziness and blurred vision. Dizzy spell yesterday falling. Subsequent headache and neck pain. EXAM: CT HEAD WITHOUT CONTRAST CT CERVICAL SPINE WITHOUT CONTRAST TECHNIQUE: Multidetector CT imaging of the head and cervical spine was performed following the standard protocol without intravenous contrast. Multiplanar CT image reconstructions of the cervical spine were also generated. COMPARISON:  03/28/2012.  02/16/2010. FINDINGS: CT HEAD FINDINGS Brain: Normal without evidence of accelerated atrophy, old or acute infarction, mass lesion, hemorrhage, hydrocephalus or extra-axial collection. Vascular: There is atherosclerotic calcification of the major vessels at the base of the brain. This is particularly notable affecting the left vertebral artery. Skull: Normal Sinuses/Orbits: Clear/normal Other: None significant CT CERVICAL SPINE FINDINGS Alignment: Normal Skull base and vertebrae: Normal. No fracture. No traumatic finding. Soft tissues and spinal canal: No significant finding. Disc levels:  C1-2: Ordinary mild osteoarthritis. C2-3: Facet degeneration on the left. Mild bony foraminal narrowing on the left. C3-4: Right-sided predominant facet arthropathy. Endplate osteophytes. Foraminal stenosis right more than left. C4-5:  Bilateral facet arthropathy.  Bilateral foraminal stenosis. C5-6:  Previous ACDF.  Solid union. C6-7: Degenerative spondylosis with foraminal stenosis left worse than right. C7-T1: No significant finding. Upper chest: Negative Other: None significant IMPRESSION: Head CT: No acute or traumatic  finding. Atherosclerotic calcification noted, particularly affecting the left vertebral artery. Cervical spine CT: No acute or traumatic finding. Previous ACDF C5-6. Degenerative changes at the other levels as outlined above. Foraminal stenosis on the left at C6-7 particularly pronounced. Electronically Signed   By: Nelson Chimes M.D.   On: 09/27/2016 15:13   US Scrotum  Result Date: 09/28/2016 CLINICAL DATA:  Testicular pain for several years, left-greater-than-right, diabetes, high. EXAM: ULTRASOUND OF SCROTUM TECHNIQUE: Complete ultrasound examination of the testicles, epididymis, and other scrotal structures was performed. COMPARISON:  None. FINDINGS: Right testicle Measurements: The right testicle measures 3.4 x 1.6 x 2.6 cm. No intratesticular abnormality is seen. Blood flow is demonstrated to the right testicle with arterial and venous waveforms. Left testicle Measurements: The left testicle measures 3.4 x 1.7 x 2.5 cm. No intratesticular abnormality is seen. Blood flow is demonstrated to the left testicle with arterial and venous waveforms. Right epididymis:  The right epididymis is unremarkable. Left epididymis:  The left epididymis also is unremarkable. Hydrocele:  A small amount of fluid is noted on the right. Varicocele: Bilateral  varicoceles are present which do augment with Valsalva maneuver. A small extratesticular calcification is present of 9 mm on the left probably representing a scrotal pearl. IMPRESSION: 1. Bilateral varicoceles. 2. No intratesticular abnormality. Blood flow is demonstrated to both testicles. 3. Small amount of fluid on the right. 4. Question of small 9 mm left scrotal pearl. Electronically Signed   By: Ivar Drape M.D.   On: 09/28/2016 09:35   [place] Art/ven Flow Abd Pelv Doppler  Result Date: 09/28/2016 CLINICAL DATA:  Testicular pain for several years, left-greater-than-right, diabetes, high. EXAM: ULTRASOUND OF SCROTUM TECHNIQUE: Complete ultrasound examination of the  testicles, epididymis, and other scrotal structures was performed. COMPARISON:  None. FINDINGS: Right testicle Measurements: The right testicle measures 3.4 x 1.6 x 2.6 cm. No intratesticular abnormality is seen. Blood flow is demonstrated to the right testicle with arterial and venous waveforms. Left testicle Measurements: The left testicle measures 3.4 x 1.7 x 2.5 cm. No intratesticular abnormality is seen. Blood flow is demonstrated to the left testicle with arterial and venous waveforms. Right epididymis:  The right epididymis is unremarkable. Left epididymis:  The left epididymis also is unremarkable. Hydrocele:  A small amount of fluid is noted on the right. 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 A small extratesticular calcification is present of 9 mm on the left probably representing a scrotal pearl. IMPRESSION: 1. Bilateral varicoceles. 2. No intratesticular abnormality. Blood flow is demonstrated to both testicles. 3. Small amount of fluid on the right. 4. Question of small 9 mm left scrotal pearl. Electronically Signed   By: Ivar Drape M.D.   On: 09/28/2016 09:35     Scheduled Meds: . [MAR Hold] aspirin EC  81 mg Oral Daily  . [MAR Hold] atorvastatin  10 mg Oral Daily  . [MAR Hold] carvedilol  25 mg Oral BID WC  . [MAR Hold] coumadin book   Does not apply Once  . [MAR Hold] enoxaparin (LOVENOX) injection  100 mg Subcutaneous Q12H  . [MAR Hold] FLUoxetine  40 mg Oral Daily  . [MAR Hold] insulin aspart  0-15 Units Subcutaneous TID WC  . [MAR Hold] insulin aspart  0-5 Units Subcutaneous QHS  . [MAR Hold] insulin detemir  50 Units Subcutaneous QHS  . [MAR Hold] linagliptin  5 mg Oral Daily  . [MAR Hold] metoprolol  5 mg Intravenous Once  . [MAR Hold] pantoprazole  40 mg Oral Daily  . [MAR Hold] tamsulosin  0.4 mg Oral Daily  . [MAR Hold] warfarin  2.5 mg Oral ONCE-1800  . [MAR Hold] Warfarin - Pharmacist Dosing Inpatient   Does not apply  q1800   Continuous Infusions: . sodium chloride    . sodium chloride 20 mL/hr at 09/29/16 1011  . amiodarone 30 mg/hr (09/29/16 1103)     LOS: 2 days   Time Spent in minutes   30 minutes  Teosha Casso D.O. on 09/29/2016 at 11:38 AM  Between 7am to 7pm - Pager - [phone]  After 7pm go to [email or web address] - password [email or web address]  And look for the night coverage person covering for me after hours  [facility] Office  [phone]

## 2016-09-29 NOTE — Progress Notes (Signed)
Inpatient Diabetes Program Recommendations  AACE/ADA: New Consensus Statement on Inpatient Glycemic Control (2015)  Target Ranges:  Prepandial:   less than 140 mg/dL      Peak postprandial:   less than 180 mg/dL (1-2 hours)      Critically ill patients:  140 - 180 mg/dL   Lab Results  Component Value Date   GLUCAP 187 (H) 09/29/2016   HGBA1C 12.8 (H) 09/27/2016    Review of Glycemic Control  Inpatient Diabetes Program Recommendations:   Spoke with pt about A1C 12.8 (average CBG 328 over the past 2-3 months) results with them and explained what an A1C is, basic pathophysiology of DM Type 2, basic home care, basic diabetes diet nutrition principles, importance of checking CBGs and maintaining good CBG control to prevent long-term and short-term complications. Reviewed signs and symptoms of hyperglycemia and hypoglycemia and how to treat hypoglycemia at home. Also reviewed blood sugar goals at home.  RNs to provide ongoing basic DM education at bedside with this patient. Have ordered educational booklet, insulin starter kit, and DM videos. Patient states he has currently been drinking up to a gallon of milk per day and reviewed carbohydrate content. Patient is also planning to check on rejoining Silver Sneakers and exercising according to physician recommendations.  Thank you, Nani Gasser. Asal Teas, RN, MSN, CDE Inpatient Glycemic Control Team Team Pager [phone] (8am-5pm) 09/29/2016 11:04 AM

## 2016-09-29 NOTE — Progress Notes (Signed)
INR = 2.16 this AM. Will continue to monitor.

## 2016-09-29 NOTE — Transfer of Care (Signed)
Immediate Anesthesia Transfer of Care Note  Patient: [name] [name]  Procedure(s) Performed: Procedure(s): TRANSESOPHAGEAL ECHOCARDIOGRAM (TEE) (N/A) CARDIOVERSION (N/A)  Patient Location: Endoscopy Unit  Anesthesia Type:MAC  Level of Consciousness: awake, alert  and oriented  Airway & Oxygen Therapy: Patient Spontanous Breathing and Patient connected to nasal cannula oxygen  Post-op Assessment: Report given to RN, Post -op Vital signs reviewed and stable and Patient moving all extremities X 4  Post vital signs: Reviewed and stable  Last Vitals:  Vitals:   09/29/16 0558 09/29/16 1008  BP: 115/80 (!) 139/94  Pulse: 67 87  Resp: 16 13  Temp: 36.4 C 36.6 C    Last Pain:  Vitals:   09/29/16 1008  TempSrc: Oral  PainSc:          Complications: No apparent anesthesia complications

## 2016-09-29 NOTE — Progress Notes (Signed)
Patient Name: [name] [name] Date of Encounter: 09/29/2016  Primary Cardiologist: Peter Martinique, MD  Hospital Problem List     Active Problems:   Automatic implantable cardioverter-defibrillator in situ   DM (diabetes mellitus), type 2 (HCC)   Atrial flutter with rapid ventricular response (HCC)   Dizziness   Chest pain   Shortness of breath   S/P coronary artery stent placement     Subjective   Feeling better but reports mild shortness of breath  Inpatient Medications    Scheduled Meds: . [MAR Hold] aspirin EC  81 mg Oral Daily  . [MAR Hold] atorvastatin  10 mg Oral Daily  . [MAR Hold] carvedilol  25 mg Oral BID WC  . [MAR Hold] coumadin book   Does not apply Once  . [MAR Hold] enoxaparin (LOVENOX) injection  100 mg Subcutaneous Q12H  . [MAR Hold] FLUoxetine  40 mg Oral Daily  . [MAR Hold] insulin aspart  0-15 Units Subcutaneous TID WC  . [MAR Hold] insulin aspart  0-5 Units Subcutaneous QHS  . [MAR Hold] insulin detemir  50 Units Subcutaneous QHS  . [MAR Hold] linagliptin  5 mg Oral Daily  . [MAR Hold] metoprolol  5 mg Intravenous Once  . [MAR Hold] pantoprazole  40 mg Oral Daily  . [MAR Hold] tamsulosin  0.4 mg Oral Daily  . [MAR Hold] warfarin  2.5 mg Oral ONCE-1800  . [MAR Hold] Warfarin - Pharmacist Dosing Inpatient   Does not apply q1800   Continuous Infusions: . sodium chloride    . sodium chloride 20 mL/hr at 09/29/16 1011  . amiodarone 30 mg/hr (09/29/16 0241)   PRN Meds: [MAR Hold] acetaminophen, [MAR Hold] metoprolol, [MAR Hold] ondansetron (ZOFRAN) IV   Vital Signs    Vitals:   09/28/16 0355 09/29/16 0137 09/29/16 0558 09/29/16 1008  BP:  119/86 115/80 (!) 139/94  Pulse:  62 67 87  Resp:  17 16 13   Temp:  97.3 F (36.3 C) 97.5 F (36.4 C) 97.8 F (36.6 C)  TempSrc:  Oral Oral Oral  SpO2:  99% 98% 100%  Weight: 101.6 kg (224 lb)  102.6 kg (226 lb 1.6 oz) 102.6 kg (226 lb 1.6 oz)  Height:    6' (1.829 m)    Intake/Output Summary  (Last 24 hours) at 09/29/16 1050 Last data filed at 09/29/16 0600  Gross per 24 hour  Intake          1205.71 ml  Output              600 ml  Net           605.71 ml   Filed Weights   09/28/16 0355 09/29/16 0558 09/29/16 1008  Weight: 101.6 kg (224 lb) 102.6 kg (226 lb 1.6 oz) 102.6 kg (226 lb 1.6 oz)    Physical Exam   GEN: Well nourished, well developed, in no acute distress.  HEENT: Grossly normal.  Neck: Supple, no JVD, carotid bruits, or masses. Cardiac: Irregularly irregular, no murmurs, rubs, or gallops. No clubbing, cyanosis, edema.  Radials/DP/PT 2+ and equal bilaterally.  Respiratory:  Respirations regular and unlabored, clear to auscultation bilaterally. GI: Soft, nontender, nondistended, BS + x 4. MS: no deformity or atrophy. Skin: warm and dry, no rash. Neuro:  Strength and sensation are intact. Psych: AAOx3.  Normal affect.  Labs    CBC  Recent Labs  09/28/16 0546 09/29/16 0325  WBC 6.2 6.5  NEUTROABS  --  4.2  HGB 13.1  12.8*  HCT 37.6* 37.1*  MCV 86.4 86.1  PLT 189 350   Basic Metabolic Panel  Recent Labs  09/27/16 1823 09/28/16 0546 09/29/16 0325  NA  --  136 138  K  --  3.6 3.5  CL  --  108 108  CO2  --  21* 24  GLUCOSE  --  226* 197*  BUN  --  25* 24*  CREATININE  --  1.41* 1.49*  CALCIUM  --  8.3* 8.4*  MG 2.0  --  2.0   Liver Function Tests  Recent Labs  09/27/16 1331  AST 22  ALT 24  ALKPHOS 90  BILITOT 1.2  PROT 7.0  ALBUMIN 4.1   No results for input(s): LIPASE, AMYLASE in the last 72 hours. Cardiac Enzymes  Recent Labs  09/27/16 1823 09/27/16 2333 09/28/16 0546  TROPONINI 0.03* 0.03* 0.03*   BNP Invalid input(s): POCBNP D-Dimer No results for input(s): DDIMER in the last 72 hours. Hemoglobin A1C  Recent Labs  09/27/16 1824  HGBA1C 12.8*   Fasting Lipid Panel No results for input(s): CHOL, HDL, LDLCALC, TRIG, CHOLHDL, LDLDIRECT in the last 72 hours. Thyroid Function Tests  Recent Labs  09/27/16 1824   TSH 2.313    Telemetry    Atrial fibrillation.  Rates <100 bpm.  PVCs - Personally Reviewed  ECG    09/28/16: Atrial flutter rate 135 bpm.  Prior anterior infarct.  Inferolateral TWI. - Personally Reviewed  Radiology    Dg Chest 2 View  Result Date: 09/27/2016 CLINICAL DATA:  Shortness of breath and weakness. EXAM: CHEST  2 VIEW COMPARISON:  01/13/2015 FINDINGS: Stable position of the left cardiac dual-chamber ICD. Heart size is within normal limits and stable. Median sternotomy wires are noted. Both lungs are clear without pulmonary edema. No pleural effusions. No acute bone abnormality. IMPRESSION: No active cardiopulmonary disease. Electronically Signed   By: Markus Daft M.D.   On: 09/27/2016 12:59   Ct Head Wo Contrast  Result Date: 09/27/2016 CLINICAL DATA:  Dizziness and blurred vision. Dizzy spell yesterday falling. Subsequent headache and neck pain. EXAM: CT HEAD WITHOUT CONTRAST CT CERVICAL SPINE WITHOUT CONTRAST TECHNIQUE: Multidetector CT imaging of the head and cervical spine was performed following the standard protocol without intravenous contrast. Multiplanar CT image reconstructions of the cervical spine were also generated. COMPARISON:  03/28/2012.  02/16/2010. FINDINGS: CT HEAD FINDINGS Brain: Normal without evidence of accelerated atrophy, old or acute infarction, mass lesion, hemorrhage, hydrocephalus or extra-axial collection. Vascular: There is atherosclerotic calcification of the major vessels at the base of the brain. This is particularly notable affecting the left vertebral artery. Skull: Normal Sinuses/Orbits: Clear/normal Other: None significant CT CERVICAL SPINE FINDINGS Alignment: Normal Skull base and vertebrae: Normal. No fracture. No traumatic finding. Soft tissues and spinal canal: No significant finding. Disc levels:  C1-2: Ordinary mild osteoarthritis. C2-3: Facet degeneration on the left. Mild bony foraminal narrowing on the left. C3-4: Right-sided  predominant facet arthropathy. Endplate osteophytes. Foraminal stenosis right more than left. C4-5:  Bilateral facet arthropathy.  Bilateral foraminal stenosis. C5-6:  Previous ACDF.  Solid union. C6-7: Degenerative spondylosis with foraminal stenosis left worse than right. C7-T1: No significant finding. Upper chest: Negative Other: None significant IMPRESSION: Head CT: No acute or traumatic finding. Atherosclerotic calcification noted, particularly affecting the left vertebral artery. Cervical spine CT: No acute or traumatic finding. Previous ACDF C5-6. Degenerative changes at the other levels as outlined above. Foraminal stenosis on the left at C6-7 particularly pronounced. Electronically Signed  By: Nelson Chimes M.D.   On: 09/27/2016 15:13   Ct Cervical Spine Wo Contrast  Result Date: 09/27/2016 CLINICAL DATA:  Dizziness and blurred vision. Dizzy spell yesterday falling. Subsequent headache and neck pain. EXAM: CT HEAD WITHOUT CONTRAST CT CERVICAL SPINE WITHOUT CONTRAST TECHNIQUE: Multidetector CT imaging of the head and cervical spine was performed following the standard protocol without intravenous contrast. Multiplanar CT image reconstructions of the cervical spine were also generated. COMPARISON:  03/28/2012.  02/16/2010. FINDINGS: CT HEAD FINDINGS Brain: Normal without evidence of accelerated atrophy, old or acute infarction, mass lesion, hemorrhage, hydrocephalus or extra-axial collection. Vascular: There is atherosclerotic calcification of the major vessels at the base of the brain. This is particularly notable affecting the left vertebral artery. Skull: Normal Sinuses/Orbits: Clear/normal Other: None significant CT CERVICAL SPINE FINDINGS Alignment: Normal Skull base and vertebrae: Normal. No fracture. No traumatic finding. Soft tissues and spinal canal: No significant finding. Disc levels:  C1-2: Ordinary mild osteoarthritis. C2-3: Facet degeneration on the left. Mild bony foraminal narrowing on  the left. C3-4: Right-sided predominant facet arthropathy. Endplate osteophytes. Foraminal stenosis right more than left. C4-5:  Bilateral facet arthropathy.  Bilateral foraminal stenosis. C5-6:  Previous ACDF.  Solid union. C6-7: Degenerative spondylosis with foraminal stenosis left worse than right. C7-T1: No significant finding. Upper chest: Negative Other: None significant IMPRESSION: Head CT: No acute or traumatic finding. Atherosclerotic calcification noted, particularly affecting the left vertebral artery. Cervical spine CT: No acute or traumatic finding. Previous ACDF C5-6. Degenerative changes at the other levels as outlined above. Foraminal stenosis on the left at C6-7 particularly pronounced. Electronically Signed   By: Nelson Chimes M.D.   On: 09/27/2016 15:13   US Scrotum  Result Date: 09/28/2016 CLINICAL DATA:  Testicular pain for several years, left-greater-than-right, diabetes, high. EXAM: ULTRASOUND OF SCROTUM TECHNIQUE: Complete ultrasound examination of the testicles, epididymis, and other scrotal structures was performed. COMPARISON:  None. FINDINGS: Right testicle Measurements: The right testicle measures 3.4 x 1.6 x 2.6 cm. No intratesticular abnormality is seen. Blood flow is demonstrated to the right testicle with arterial and venous waveforms. Left testicle Measurements: The left testicle measures 3.4 x 1.7 x 2.5 cm. No intratesticular abnormality is seen. Blood flow is demonstrated to the left testicle with arterial and venous waveforms. Right epididymis:  The right epididymis is unremarkable. Left epididymis:  The left epididymis also is unremarkable. Hydrocele:  A small amount of fluid is noted on the right. 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 A small extratesticular calcification is present of 9 mm on the left probably representing a scrotal pearl. IMPRESSION: 1. Bilateral varicoceles. 2. No intratesticular abnormality. Blood flow is  demonstrated to both testicles. 3. Small amount of fluid on the right. 4. Question of small 9 mm left scrotal pearl. Electronically Signed   By: Ivar Drape M.D.   On: 09/28/2016 09:35   [place] Art/ven Flow Abd Pelv Doppler  Result Date: 09/28/2016 CLINICAL DATA:  Testicular pain for several years, left-greater-than-right, diabetes, high. EXAM: ULTRASOUND OF SCROTUM TECHNIQUE: Complete ultrasound examination of the testicles, epididymis, and other scrotal structures was performed. COMPARISON:  None. FINDINGS: Right testicle Measurements: The right testicle measures 3.4 x 1.6 x 2.6 cm. No intratesticular abnormality is seen. Blood flow is demonstrated to the right testicle with arterial and venous waveforms. Left testicle Measurements: The left testicle measures 3.4 x 1.7 x 2.5 cm. No intratesticular abnormality is seen. Blood flow is demonstrated to the left testicle with arterial  and venous waveforms. Right epididymis:  The right epididymis is unremarkable. Left epididymis:  The left epididymis also is unremarkable. Hydrocele:  A small amount of fluid is noted on the right. 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 A small extratesticular calcification is present of 9 mm on the left probably representing a scrotal pearl. IMPRESSION: 1. Bilateral varicoceles. 2. No intratesticular abnormality. Blood flow is demonstrated to both testicles. 3. Small amount of fluid on the right. 4. Question of small 9 mm left scrotal pearl. Electronically Signed   By: Ivar Drape M.D.   On: 09/28/2016 09:35    Cardiac Studies   Echo 01/08/15: Study Conclusions  - Left ventricle: The cavity size was normal. Wall thickness was normal. Systolic function was moderately to severely reduced. The estimated ejection fraction was in the range of 30% to 35%. Features are consistent with a pseudonormal left ventricular filling pattern, with concomitant abnormal relaxation  and increased filling pressure (grade 2 diastolic dysfunction). - Ventricular septum: Septal motion showed abnormal function, dyssynergy, and paradox. - Mitral valve: Prior procedures included surgical repair. An annular ring prosthesis was present. There was moderate regurgitation directed eccentrically and anteriorly. Valve area by pressure half-time: 2.29 cm^2. - Left atrium: The atrium was severely dilated.  Patient Profile     Mr. Vonruden is a 1M with CAD, chronic systolic and diastolic heart failure, s/p ICD, mitral valve prolapse s/p annuloplasty, hypertension and hyuperlipidemia admitted with new onset atypical atrial flutter.   Assessment & Plan    # Paroxysmal atrial fibrillation/flutter: On further review of his chart it appears that he had atrial fibrillation on his ICD check in 2010. There has not been any recurrence clinically since that time. Rates on admission were in the 140s.  Rates were poorly controlled and his blood pressure was low so amiodarone was added in addition to metoprolol.  Given his reduced systolic function he was not started on diltiazem.  His heart rates are now in the 70s to 80s but he remains in atrial fibrillation.  He is currently awaiting TEE and DC cardioversion.  We would like to stop amiodarone if possible.  Given his heart failure, amiodarone and dofetilide of the only antiarrhythmic options. Given his renal dysfunction, dofetilide is not ideal.  # Chronic systolic and diastolic heart failure: LVEF 30-35%.  At home he was on valsartan which has been held due to acute renal failure. His baseline renal function is around 1.3. He also has not had much blood pressure room. We'll consider switching carvedilol to metoprolol in order to allow room to add back his ARB and for improved rate control if he does go back into atrial fibrillation.  # CAD: Not an active issue.  He is s/p multiple interventions,  most recently 01/2015 at which time he had  drug-eluting stents placed in OM1 and D1. Troponin was mildly elevated to 0.03, though this seems to be related to demand ischemia. Continue statin and we'll likely switch from carvedilol to metoprolol as above. He ran out of Plavix 3 weeks ago. Continue aspirin.  He will now be on evidence for atrial fibrillation.   # Hypertension:  Blood pressure has been low. We'll switch from carvedilol to metoprolol and try to add back valsartan when blood pressure and renal function tolerates.   # Hyperlipidemia: Continue atorvastatin.  No recent lipids are on file. We will check them in the morning.   Signed, Skeet Latch, MD  09/29/2016, 10:50 AM

## 2016-09-29 NOTE — Progress Notes (Signed)
[place] for Lovenox/Coumadin Indication: atrial fibrillation  Allergies  Allergen Reactions  . Metformin And Related Nausea Only and Other (See Comments)    Loss of appetite  . Lisinopril Cough    Patient Measurements: Height: 6' (182.9 cm) Weight: 226 lb 1.6 oz (102.6 kg) IBW/kg (Calculated) : 77.6  Vital Signs: Temp: 97.8 F (36.6 C) (10/18 1008) Temp Source: Oral (10/18 1008) BP: 139/94 (10/18 1008) Pulse Rate: 87 (10/18 1008)  Labs:  Recent Labs  09/27/16 1331 09/27/16 1449 09/27/16 1823 09/27/16 2333 09/28/16 0546 09/29/16 0325  HGB  --   --  14.7  --  13.1 12.8*  HCT  --   --  41.0  --  37.6* 37.1*  PLT  --   --  222  --  189 187  LABPROT 13.9  --   --   --  15.5* 24.5*  INR 1.07  --   --   --  1.22 2.16  CREATININE  --  1.67*  --   --  1.41* 1.49*  TROPONINI  --   --  0.03* 0.03* 0.03*  --     Estimated Creatinine Clearance (by C-G formula based on SCr of 1.49 mg/dL (H)) Male: 49.1 mL/min Male: 59.6 mL/min   Medical History: Past Medical History:  Diagnosis Date  . AICD (automatic cardioverter/defibrillator) present   . Anxiety   . CAD (coronary artery disease)    a.  1993 s/p MI - [facility];  b. s/p BMS to LAD '00;  c. PTCA 2nd diagonal 2010;  d. 02/18/12 Cath: moderate nonobs dzs - med rx;  e.  01/2015 Cath: LM nl, LAD 40-70m ISR, 70-46m/d, d1 90p (3.0x16 Synergy DES), D2 50-60, LCX nl, OM1 50p, 72m (2.5x12 Synergy DES), RCA nl, EF 30-35%.  . Chronic combined systolic and diastolic CHF (congestive heart failure) ([place])    a. 12/2014 Echo: EF 30-35%, Gr2 DD, mod MR, sev dil LA.  Marland Kitchen Chronic ischemic heart disease   . CKD (chronic kidney disease), stage III   . Depression   . ED (erectile dysfunction)   . Episodic mood disorder ([place])   . GERD (gastroesophageal reflux disease)   . Heart murmur   . Hyperlipidemia   . Hypertension   . Ischemic cardiomyopathy    a. s/p St. [name] ([place]) ICD implanted in  [place] 2007;  b. 12/2014 Echo: Ef 30-35%.  . MVP (mitral valve prolapse)    a. s/p MV annuloplasty at [facility] 2004.  Marland Kitchen PAF (paroxysmal atrial fibrillation) ([place])    a. noted on ICD interrogation '10 - not previously on [place] - CHA2DS2VASc = 5.  . Type II diabetes mellitus ([place])    uncontrolled    Medications:  Scheduled:  . aspirin EC  81 mg Oral Daily  . atorvastatin  10 mg Oral Daily  . carvedilol  25 mg Oral BID WC  . coumadin book   Does not apply Once  . enoxaparin (LOVENOX) injection  100 mg Subcutaneous Q12H  . FLUoxetine  40 mg Oral Daily  . insulin aspart  0-15 Units Subcutaneous TID WC  . insulin aspart  0-5 Units Subcutaneous QHS  . insulin detemir  50 Units Subcutaneous QHS  . linagliptin  5 mg Oral Daily  . metoprolol  5 mg Intravenous Once  . pantoprazole  40 mg Oral Daily  . tamsulosin  0.4 mg Oral Daily  . Warfarin - Pharmacist Dosing Inpatient   Does not apply [phone]  Assessment: 65yo male with new valvular afib, continuing on Lovenox + warfarin. INR 1.07 at baseline, now with large jump to 2.14 s/p 2 doses of warfarin 7.5mg . CBC stable, CrCl>30. No bleed documented. For TEE/DCCV this AM. Noted patient is on amiodarone.   Goal of Therapy:  INR 2-3 Monitor platelets by anticoagulation protocol: Yes   Plan:  D/c Lovenox with therapeutic INR, large jump over 2 days Coumadin 2.5mg  x 1 Daily INR Monitor for s/sx bleeding  Elicia Lamp, PharmD, BCPS Clinical Pharmacist 09/29/2016 10:23 AM

## 2016-09-29 NOTE — Anesthesia Postprocedure Evaluation (Signed)
Anesthesia Post Note  Patient: [name] [name]  Procedure(s) Performed: Procedure(s) (LRB): TRANSESOPHAGEAL ECHOCARDIOGRAM (TEE) (N/A) CARDIOVERSION (N/A)  Patient location during evaluation: PACU Anesthesia Type: MAC Level of consciousness: awake and alert Pain management: pain level controlled Vital Signs Assessment: post-procedure vital signs reviewed and stable Respiratory status: spontaneous breathing, nonlabored ventilation, respiratory function stable and patient connected to nasal cannula oxygen Cardiovascular status: stable and blood pressure returned to baseline Anesthetic complications: no    Last Vitals:  Vitals:   09/29/16 1142 09/29/16 1203  BP: (!) 138/98 (!) 141/96  Pulse: 69 69  Resp: 12   Temp:      Last Pain:  Vitals:   09/29/16 1254  TempSrc:   PainSc: 0-No pain                 [name]

## 2016-09-29 NOTE — CV Procedure (Signed)
See full TEE report in [employer] Pt sedated by anesthesia with lidocaine 60 mg and diprovan 126 mg IV. Severe LV dysfunction No LAA thrombus Pt subsequently had DCCV 120 J to sinus rhythm No immediate complications Continue lovenox until INR therapeutic Kirk Ruths, MD

## 2016-09-29 NOTE — Anesthesia Procedure Notes (Signed)
Procedure Name: MAC Date/Time: 09/29/2016 10:45 AM Performed by: Trixie Deis A Pre-anesthesia Checklist: Patient identified, Emergency Drugs available, Suction available, Patient being monitored and Timeout performed Oxygen Delivery Method: Nasal cannula Placement Confirmation: positive ETCO2

## 2016-09-29 NOTE — Anesthesia Preprocedure Evaluation (Addendum)
Anesthesia Evaluation  Patient identified by MRN, date of birth, ID band Patient awake    Reviewed: Allergy & Precautions, NPO status , Patient's Chart, lab work & pertinent test results  Airway Mallampati: II  TM Distance: >3 FB Neck ROM: Full    Dental  (+) Poor Dentition,    Pulmonary    Pulmonary exam normal        Cardiovascular hypertension, + CAD (1993 s/p MI - [facility];  b. s/p BMS to LAD '00;  c. PTCA 2nd diagonal 2010;  d. 02/18/12 Cath: moderate nonobs dzs - med rx;  e.  01/2015 Cath: LM nl, LAD 40-59m ISR, 70-39m/d, d1 90p (3.0x16 Synergy DES), D2 50-60, LCX nl, OM1 50p, 41m (2.5x12 Synerg) and +CHF  + dysrhythmias Atrial Fibrillation + Cardiac Defibrillator + Valvular Problems/Murmurs MR  Rhythm:Irregular Rate:Normal  Systolic function was moderately to severely reduced. The estimated ejection fraction was in the range of 30% to 35%. Features are consistent with a pseudonormal left ventricular filling pattern, with concomitant abnormal relaxation and increased filling pressure (grade 2 diastolic dysfunction). - Ventricular septum: Septal motion showed abnormal function, dyssynergy, and paradox. - Mitral valve: Prior procedures included surgical repair. An annular ring prosthesis was present. There was moderate regurgitation directed eccentrically and anteriorly. Valve area by pressure half-time: 2.29 cm^2. - Left atrium: The atrium was severely dilate   Neuro/Psych    GI/Hepatic GERD  ,  Endo/Other  diabetes, Type 2  Renal/GU CRFRenal disease     Musculoskeletal   Abdominal   Peds  Hematology   Anesthesia Other Findings   Reproductive/Obstetrics                          Anesthesia Physical Anesthesia Plan  ASA: IV  Anesthesia Plan: MAC   Post-op Pain Management:    Induction: Intravenous  Airway Management Planned: Simple Face Mask  Additional  Equipment:   Intra-op Plan:   Post-operative Plan:   Informed Consent: I have reviewed the patients History and Physical, chart, labs and discussed the procedure including the risks, benefits and alternatives for the proposed anesthesia with the patient or authorized representative who has indicated his/her understanding and acceptance.   Dental advisory given  Plan Discussed with: CRNA  Anesthesia Plan Comments:         Anesthesia Quick Evaluation

## 2016-09-29 NOTE — Progress Notes (Signed)
  Echocardiogram Echocardiogram Transesophageal has been performed.  [name] [name] 09/29/2016, 11:12 AM

## 2016-09-30 DIAGNOSIS — Z23 Encounter for immunization: Secondary | ICD-10-CM | POA: Diagnosis not present

## 2016-09-30 DIAGNOSIS — I255 Ischemic cardiomyopathy: Secondary | ICD-10-CM

## 2016-09-30 LAB — CBC
HEMATOCRIT: 36.1 % — AB (ref 39.0–52.0)
HEMOGLOBIN: 12.4 g/dL — AB (ref 13.0–17.0)
MCH: 29.7 pg (ref 26.0–34.0)
MCHC: 34.3 g/dL (ref 30.0–36.0)
MCV: 86.6 fL (ref 78.0–100.0)
Platelets: 180 10*3/uL (ref 150–400)
RBC: 4.17 MIL/uL — ABNORMAL LOW (ref 4.22–5.81)
RDW: 13 % (ref 11.5–15.5)
WBC: 8.3 10*3/uL (ref 4.0–10.5)

## 2016-09-30 LAB — LIPID PANEL
Cholesterol: 116 mg/dL (ref 0–200)
HDL: 26 mg/dL — AB (ref >40)
LDL CALC: 58 mg/dL (ref 0–99)
TRIGLYCERIDES: 162 mg/dL — AB (ref <150)
Total CHOL/HDL Ratio: 4.5 RATIO
VLDL: 32 mg/dL (ref 0–40)

## 2016-09-30 LAB — GLUCOSE, CAPILLARY
Glucose-Capillary: 209 mg/dL — ABNORMAL HIGH (ref 65–99)
Glucose-Capillary: 269 mg/dL — ABNORMAL HIGH (ref 65–99)

## 2016-09-30 LAB — BASIC METABOLIC PANEL
ANION GAP: 7 (ref 5–15)
BUN: 22 mg/dL — ABNORMAL HIGH (ref 6–20)
CHLORIDE: 106 mmol/L (ref 101–111)
CO2: 25 mmol/L (ref 22–32)
Calcium: 8.6 mg/dL — ABNORMAL LOW (ref 8.9–10.3)
Creatinine, Ser: 1.37 mg/dL — ABNORMAL HIGH (ref 0.61–1.24)
GFR calc non Af Amer: 52 mL/min — ABNORMAL LOW (ref >60)
GLUCOSE: 195 mg/dL — AB (ref 65–99)
Potassium: 3.7 mmol/L (ref 3.5–5.1)
Sodium: 138 mmol/L (ref 135–145)

## 2016-09-30 LAB — PROTIME-INR
INR: 3.04
PROTHROMBIN TIME: 32.1 s — AB (ref 11.4–15.2)

## 2016-09-30 MED ORDER — TAMSULOSIN HCL 0.4 MG PO CAPS: 0.4000 mg | ORAL_CAPSULE | Freq: Every day | ORAL | 0 refills | Status: DC

## 2016-09-30 MED ORDER — VALSARTAN 80 MG PO TABS: 80.0000 mg | ORAL_TABLET | Freq: Every day | ORAL | 0 refills | Status: DC

## 2016-09-30 MED ORDER — INSULIN ASPART PROT & ASPART (70-30 MIX) 100 UNIT/ML ~~LOC~~ SUSP: 35.0000 [IU] | Freq: Two times a day (BID) | SUBCUTANEOUS | 2 refills | Status: DC

## 2016-09-30 MED ORDER — WARFARIN SODIUM 1 MG PO TABS: 1.0000 mg | ORAL_TABLET | Freq: Once | ORAL | 0 refills | Status: DC

## 2016-09-30 MED ORDER — BLOOD GLUCOSE METER KIT: PACK | 0 refills | Status: DC

## 2016-09-30 MED ORDER — INSULIN PEN NEEDLE 31G X 5 MM MISC: 0 refills | Status: DC

## 2016-09-30 MED ORDER — WARFARIN SODIUM 2 MG PO TABS: 1.0000 mg | ORAL_TABLET | Freq: Once | ORAL | Status: DC

## 2016-09-30 NOTE — Progress Notes (Signed)
Inpatient Diabetes Program Recommendations  AACE/ADA: New Consensus Statement on Inpatient Glycemic Control (2015)  Target Ranges:  Prepandial:   less than 140 mg/dL      Peak postprandial:   less than 180 mg/dL (1-2 hours)      Critically ill patients:  140 - 180 mg/dL   Results for [name] [name], [name] [name] (MRN [id number]) as of 09/30/2016 10:51  Ref. Range 09/27/2016 18:24  Hemoglobin A1C Latest Ref Range: 4.8 - 5.6 % 12.8 (H)   Review of Glycemic Control  Diabetes history: DM2 Outpatient Diabetes medications: Levemir 50 units QHS, Januvia 100 mg daily Current orders for Inpatient glycemic control: Levemir 50 units QHS, Novolog 0-15 units TID with meals, Novolog 0-5 units QHS, Tradjenta 5 mg daily  Inpatient Diabetes Program Recommendations: HgbA1C: A1C 12.8% on 09/27/16 indicating an average glucose of 321 mg/dl. Noted in H&P that patient has not been taking Levemir because he was not able to afford it. May want to consider discontinuing Levemir and ordering Novolin NPH 25 units BID or if MD feels patient needs basal and meal coverage could order 70/30 35 units BID (which will provide 49 units of basal and 21 units for meal coverage per day). Patient could purchase Novolin NPH or Novolin 70/30 at [facility] for $25 per vial.  NOTE:  Patient was seen by Dorethea Clan, RN, CDE, Diabetes Coordinator on 09/29/16 regarding glycemic control (see note for details). Noted in H&P that patient had not taken Levemir since beginning of the month because he was not able to afford to purchase insulin.   Thanks, Barnie Alderman, RN, MSN, CDE Diabetes Coordinator Inpatient Diabetes Program [phone] (Team Pager from [place] to [place]) [phone] ([facility] office) [phone] [facility] office) [phone] [facility] office)

## 2016-09-30 NOTE — Progress Notes (Signed)
[place] for Coumadin Indication: atrial fibrillation  Allergies  Allergen Reactions  . Metformin And Related Nausea Only and Other (See Comments)    Loss of appetite  . Lisinopril Cough    Patient Measurements: Height: 6' (182.9 cm) Weight: 229 lb 8 oz (104.1 kg) (scale a) IBW/kg (Calculated) : 77.6  Vital Signs: Temp: 97.7 F (36.5 C) (10/19 1014) Temp Source: Oral (10/19 1014) BP: 136/93 (10/19 1014) Pulse Rate: 69 (10/19 1014)  Labs:  Recent Labs  09/27/16 1823 09/27/16 2333 09/28/16 0546 09/29/16 0325 09/30/16 0440  HGB 14.7  --  13.1 12.8* 12.4*  HCT 41.0  --  37.6* 37.1* 36.1*  PLT 222  --  189 187 180  LABPROT  --   --  15.5* 24.5* 32.1*  INR  --   --  1.22 2.16 3.04  CREATININE  --   --  1.41* 1.49* 1.37*  TROPONINI 0.03* 0.03* 0.03*  --   --     Estimated Creatinine Clearance (by C-G formula based on SCr of 1.37 mg/dL (H)) Male: 53.8 mL/min Male: 65.3 mL/min   Medical History: Past Medical History:  Diagnosis Date  . AICD (automatic cardioverter/defibrillator) present   . Anxiety   . CAD (coronary artery disease)    a.  1993 s/p MI - [employer] [facility];  b. s/p BMS to LAD '00;  c. PTCA 2nd diagonal 2010;  d. 02/18/12 Cath: moderate nonobs dzs - med rx;  e.  01/2015 Cath: LM nl, LAD 40-84m ISR, 70-72m/d, d1 90p (3.0x16 Synergy DES), D2 50-60, LCX nl, OM1 50p, 43m (2.5x12 Synergy DES), RCA nl, EF 30-35%.  . Chronic combined systolic and diastolic CHF (congestive heart failure) ([place])    a. 12/2014 Echo: EF 30-35%, Gr2 DD, mod MR, sev dil LA.  Marland Kitchen Chronic ischemic heart disease   . CKD (chronic kidney disease), stage III   . Depression   . ED (erectile dysfunction)   . Episodic mood disorder ([place])   . GERD (gastroesophageal reflux disease)   . Heart murmur   . Hyperlipidemia   . Hypertension   . Ischemic cardiomyopathy    a. s/p St. [name] (Atlas) ICD implanted in [place] 2007;  b. 12/2014 Echo: Ef 30-35%.  . MVP  (mitral valve prolapse)    a. s/p MV annuloplasty at [facility] 2004.  Marland Kitchen PAF (paroxysmal atrial fibrillation) ([place])    a. noted on ICD interrogation '10 - not previously on [place] - CHA2DS2VASc = 5.  . Type II diabetes mellitus ([place])    uncontrolled    Medications:  Scheduled:  . aspirin EC  81 mg Oral Daily  . atorvastatin  10 mg Oral Daily  . carvedilol  25 mg Oral BID WC  . coumadin book   Does not apply Once  . FLUoxetine  40 mg Oral Daily  . insulin aspart  0-15 Units Subcutaneous TID WC  . insulin aspart  0-5 Units Subcutaneous QHS  . insulin detemir  50 Units Subcutaneous QHS  . linagliptin  5 mg Oral Daily  . metoprolol  5 mg Intravenous Once  . pantoprazole  40 mg Oral Daily  . tamsulosin  0.4 mg Oral Daily  . Warfarin - Pharmacist Dosing Inpatient   Does not apply q1800    Assessment: 65yo male with new valvular afib, continuing on Lovenox + warfarin. INR 1.07 at baseline, now with large jump to 3.04, very slightly supratherapeutic s/p 3 doses of warfarin (dose reduced last night).  Will reduce dose again tonight. CBC stable, no bleed documented. Noted patient is on amiodarone - may affect INR.   Goal of Therapy:  INR 2-3 Monitor platelets by anticoagulation protocol: Yes   Plan:  Coumadin 1mg  x 1 Daily INR Monitor for s/sx bleeding  Elicia Lamp, PharmD, BCPS Clinical Pharmacist 09/30/2016 10:24 AM

## 2016-09-30 NOTE — Discharge Summary (Addendum)
Physician Discharge Summary  [name] [name] DZH:[id number] DOB: 1951/01/02 DOA: 09/27/2016  PCP: Garwin Brothers, MD  Admit date: 09/27/2016 Discharge date: 09/30/2016  Time spent: 45 minutes  Recommendations for Outpatient Follow-up:  Patient will be discharged to home.  Patient will need to follow up with primary care provider within one week of discharge.  Follow up with cardiology and have INR checked.  Patient should continue medications as prescribed.  Patient should follow a heart healthy/carb modified diet.   Discharge Diagnoses:  Paroxysmal Atrial flutter/fibrillation Chronic Systolic heart failure Coronary artery disease Essential hypertension Hyperlipidemia Diabetes mellitus, type II Acute kidney injury vs CKD, stage III Depression BPH  Discharge Condition: Stable  Diet recommendation: heart healthy/carb modified  Filed Weights   09/29/16 0558 09/29/16 1008 09/30/16 0420  Weight: 102.6 kg (226 lb 1.6 oz) 102.6 kg (226 lb 1.6 oz) 104.1 kg (229 lb 8 oz)    History of present illness:  On 09/27/2016 by Dr. Eulogio Bear [name] [name] is a 65 y.o. male with medical history significant of AICD, CAD, CHF, HTN, PAF.  He follows with Dr. Martinique as well as Dr. Rayann Heman for cardiac issues.  He has the following cardiac history: He underwent stenting of the proximal LAD and diagonal branch in 2000. Cardiac caths in 2005 Dorthula Rue MD) and 2013 ([place]) showed no obstructive disease. He is s/p MV repair at [facility] in 2004. He has chronic moderate MR. He has a history of chronic systolic CHF and has an ICD in place. In February 2016 he underwent right and left heart cath. He had stenting of the first diagonal and first OM with DES. There was diffuse LAD disease treated medically. EF 30-35%. Mild MR and normal right heart pressures.   He has chronic dizziness but on Saturday developed a more severe dizziness that felt like he was spinning and left him unable to  stand to even urinate.  He has been feeling palpitations as well.  He is also short of breath.  No chest pain.    He has missed doses of levemir since the beginning of the month due to costs.  His blood sugars have been elevated due to this.  He is c/o testicular pain-- has happened before and has seen "a specialist" and told he had pockets of pus.  Patient states he is hurting again.  Hospital Course:  Paroxysmal Atrial flutter/fibrillation -It seems the patient did have atrial fibrillation noted on ICD check back in 2010 -CHADSVASC at least 4 (age, HTN, CHF, DM) -Cardiology consulted and appreciated -Was placed on amiodarone drip (may consider as an outpatient if Afib reoccurs) -Continue coreg 76m BID -s/pTEE/cardioversion today  Chronic Systolic heart failure -EF noted to be 30-35% -Not on ACE inhibitor or ARB due to renal failure- Creatinine close to baseline, cardiology recommended restarting valsartan at 869mdaily, continue to hold spironolactone -Currently appears to be euvolemic -Monitored intake and output, daily weights  Coronary artery disease -Patient ran his Plavix approximately 3 weeks ago -Continue aspirin -Patient's troponin 0.03, likely related to demand ischemia  Essential hypertension -Currently on Coreg  Hyperlipidemia -Continue statin  Diabetes mellitus, type II -was placed on tradjenta, insulin sliding scale, Levemir and CBG monitoring -Patient should follow up with his PCP for more aggressive control -hemoglobin A1c 12.8 -Will discharge with Novolog 70/30 35u BID  Acute kidney injury vs CKD, stage III -Baseline creatinine 1.3, currently 1.37 -Valsartan and spironolactone were held during hospitalization -Resume valsartan at discharge, lower dose -  Continue to hold spironolactone  Depression -Continue fluoxetine   BPH -Continue Flomax  Consultants Cardiology  Procedures  US scrotum TEE  Discharge Exam: Vitals:   09/30/16 1014  09/30/16 1136  BP: (!) 136/93 125/74  Pulse: 69 63  Resp: 18 18  Temp: 97.7 F (36.5 C) 97.7 F (36.5 C)   Patient denies chest pain, shortness of breath, abdominal pain, dizziness, headache.  States he is ready to go home.   Exam  General: Well developed, well nourished, NAD  HEENT: NCAT, mucous membranes moist. Poor dentition  Neck: Supple, no JVD, no masses  Cardiovascular: Normal S1/S2. RRR. No murmurs  Respiratory: Clear to auscultation bilaterally with equal chest rise  Abdomen: Soft, nontender, nondistended, + bowel sounds  Extremities: warm dry without cyanosis clubbing or edema  Neuro: AAOx3, nonfocal  Psych: Normal affect and demeanor, pleasant  Discharge Instructions Discharge Instructions    Discharge instructions    Complete by:  As directed    Patient will be discharged to home.  Patient will need to follow up with primary care provider within one week of discharge.  Follow up with cardiology and have INR checked.  Patient should continue medications as prescribed.  Patient should follow a heart healthy/carb modified diet.     Current Discharge Medication List    START taking these medications   Details  blood glucose meter kit and supplies Dispense based on patient and insurance preference. Use up to four times daily as directed. (FOR ICD-9 250.00, 250.01). Qty: 1 each, Refills: 0    insulin aspart protamine- aspart (NOVOLOG MIX 70/30) (70-30) 100 UNIT/ML injection Inject 0.35 mLs (35 Units total) into the skin 2 (two) times daily with a meal. Qty: 10 mL, Refills: 2    Insulin Pen Needle 31G X 5 MM MISC Use with Insulin Qty: 100 each, Refills: 0    warfarin (COUMADIN) 1 MG tablet Take 1 tablet (1 mg total) by mouth one time only at 6 PM. Qty: 30 tablet, Refills: 0      CONTINUE these medications which have CHANGED   Details  tamsulosin (FLOMAX) 0.4 MG CAPS capsule Take 1 capsule (0.4 mg total) by mouth daily. Qty: 30 capsule, Refills: 0    Associated Diagnoses: CAD (coronary artery disease); Cardiomyopathy, ischemic; ICD (implantable cardioverter-defibrillator) in place    valsartan (DIOVAN) 80 MG tablet Take 1 tablet (80 mg total) by mouth daily. Qty: 30 tablet, Refills: 0   Associated Diagnoses: Ischemic cardiomyopathy      CONTINUE these medications which have NOT CHANGED   Details  aspirin EC 81 MG tablet Take 81 mg by mouth daily.    atorvastatin (LIPITOR) 20 MG tablet Take 10 mg by mouth daily.     carvedilol (COREG) 25 MG tablet Take 1 tablet (25 mg total) by mouth 2 (two) times daily with a meal. Qty: 180 tablet, Refills: 4    FLUoxetine (PROZAC) 40 MG capsule Take 40 mg by mouth daily.    omeprazole (PRILOSEC) 40 MG capsule Take 40 mg by mouth daily.    sitaGLIPtin (JANUVIA) 100 MG tablet Take 100 mg by mouth daily.    nitroGLYCERIN (NITROSTAT) 0.4 MG SL tablet Place 1 tablet (0.4 mg total) under the tongue every 5 (five) minutes x 3 doses as needed for chest pain. Qty: 25 tablet, Refills: 12    pantoprazole (PROTONIX) 40 MG tablet Take 1 tablet (40 mg total) by mouth daily. Qty: 90 tablet, Refills: 3      STOP taking  these medications     clopidogrel (PLAVIX) 75 MG tablet      spironolactone (ALDACTONE) 25 MG tablet      LEVEMIR 100 UNIT/ML injection        Allergies  Allergen Reactions  . Metformin And Related Nausea Only and Other (See Comments)    Loss of appetite  . Lisinopril Cough   Follow-up Information    [facility], [place]. Go on 10/07/2016.   Specialties:  Cardiology, Radiology Why:  @ 10:00 am post hospital  Contact information: [address] [place] [address] [address] [facility] Follow up on 10/04/2016.   Specialty:  Cardiology Why:  for INR check at 10:45 am  Contact information: [address] [place] Ogden Humble, MD. Schedule an appointment as soon as possible for a  visit in 1 week(s).   Specialty:  Internal Medicine Why:  Hospital follow up Contact information: [place] [address] [phone]            The results of significant diagnostics from this hospitalization (including imaging, microbiology, ancillary and laboratory) are listed below for reference.    Significant Diagnostic Studies: Dg Chest 2 View  Result Date: 09/27/2016 CLINICAL DATA:  Shortness of breath and weakness. EXAM: CHEST  2 VIEW COMPARISON:  01/13/2015 FINDINGS: Stable position of the left cardiac dual-chamber ICD. Heart size is within normal limits and stable. Median sternotomy wires are noted. Both lungs are clear without pulmonary edema. No pleural effusions. No acute bone abnormality. IMPRESSION: No active cardiopulmonary disease. Electronically Signed   By: Markus Daft M.D.   On: 09/27/2016 12:59   Ct Head Wo Contrast  Result Date: 09/27/2016 CLINICAL DATA:  Dizziness and blurred vision. Dizzy spell yesterday falling. Subsequent headache and neck pain. EXAM: CT HEAD WITHOUT CONTRAST CT CERVICAL SPINE WITHOUT CONTRAST TECHNIQUE: Multidetector CT imaging of the head and cervical spine was performed following the standard protocol without intravenous contrast. Multiplanar CT image reconstructions of the cervical spine were also generated. COMPARISON:  03/28/2012.  02/16/2010. FINDINGS: CT HEAD FINDINGS Brain: Normal without evidence of accelerated atrophy, old or acute infarction, mass lesion, hemorrhage, hydrocephalus or extra-axial collection. Vascular: There is atherosclerotic calcification of the major vessels at the base of the brain. This is particularly notable affecting the left vertebral artery. Skull: Normal Sinuses/Orbits: Clear/normal Other: None significant CT CERVICAL SPINE FINDINGS Alignment: Normal Skull base and vertebrae: Normal. No fracture. No traumatic finding. Soft tissues and spinal canal: No significant finding. Disc levels:  C1-2:  Ordinary mild osteoarthritis. C2-3: Facet degeneration on the left. Mild bony foraminal narrowing on the left. C3-4: Right-sided predominant facet arthropathy. Endplate osteophytes. Foraminal stenosis right more than left. C4-5:  Bilateral facet arthropathy.  Bilateral foraminal stenosis. C5-6:  Previous ACDF.  Solid union. C6-7: Degenerative spondylosis with foraminal stenosis left worse than right. C7-T1: No significant finding. Upper chest: Negative Other: None significant IMPRESSION: Head CT: No acute or traumatic finding. Atherosclerotic calcification noted, particularly affecting the left vertebral artery. Cervical spine CT: No acute or traumatic finding. Previous ACDF C5-6. Degenerative changes at the other levels as outlined above. Foraminal stenosis on the left at C6-7 particularly pronounced. Electronically Signed   By: Nelson Chimes M.D.   On: 09/27/2016 15:13   Ct Cervical Spine Wo Contrast  Result Date: 09/27/2016 CLINICAL DATA:  Dizziness and blurred vision. Dizzy spell yesterday  falling. Subsequent headache and neck pain. EXAM: CT HEAD WITHOUT CONTRAST CT CERVICAL SPINE WITHOUT CONTRAST TECHNIQUE: Multidetector CT imaging of the head and cervical spine was performed following the standard protocol without intravenous contrast. Multiplanar CT image reconstructions of the cervical spine were also generated. COMPARISON:  03/28/2012.  02/16/2010. FINDINGS: CT HEAD FINDINGS Brain: Normal without evidence of accelerated atrophy, old or acute infarction, mass lesion, hemorrhage, hydrocephalus or extra-axial collection. Vascular: There is atherosclerotic calcification of the major vessels at the base of the brain. This is particularly notable affecting the left vertebral artery. Skull: Normal Sinuses/Orbits: Clear/normal Other: None significant CT CERVICAL SPINE FINDINGS Alignment: Normal Skull base and vertebrae: Normal. No fracture. No traumatic finding. Soft tissues and spinal canal: No significant  finding. Disc levels:  C1-2: Ordinary mild osteoarthritis. C2-3: Facet degeneration on the left. Mild bony foraminal narrowing on the left. C3-4: Right-sided predominant facet arthropathy. Endplate osteophytes. Foraminal stenosis right more than left. C4-5:  Bilateral facet arthropathy.  Bilateral foraminal stenosis. C5-6:  Previous ACDF.  Solid union. C6-7: Degenerative spondylosis with foraminal stenosis left worse than right. C7-T1: No significant finding. Upper chest: Negative Other: None significant IMPRESSION: Head CT: No acute or traumatic finding. Atherosclerotic calcification noted, particularly affecting the left vertebral artery. Cervical spine CT: No acute or traumatic finding. Previous ACDF C5-6. Degenerative changes at the other levels as outlined above. Foraminal stenosis on the left at C6-7 particularly pronounced. Electronically Signed   By: Nelson Chimes M.D.   On: 09/27/2016 15:13   US Scrotum  Result Date: 09/28/2016 CLINICAL DATA:  Testicular pain for several years, left-greater-than-right, diabetes, high. EXAM: ULTRASOUND OF SCROTUM TECHNIQUE: Complete ultrasound examination of the testicles, epididymis, and other scrotal structures was performed. COMPARISON:  None. FINDINGS: Right testicle Measurements: The right testicle measures 3.4 x 1.6 x 2.6 cm. No intratesticular abnormality is seen. Blood flow is demonstrated to the right testicle with arterial and venous waveforms. Left testicle Measurements: The left testicle measures 3.4 x 1.7 x 2.5 cm. No intratesticular abnormality is seen. Blood flow is demonstrated to the left testicle with arterial and venous waveforms. Right epididymis:  The right epididymis is unremarkable. Left epididymis:  The left epididymis also is unremarkable. Hydrocele:  A small amount of fluid is noted on the right. 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 A small extratesticular calcification is present of 9 mm on the  left probably representing a scrotal pearl. IMPRESSION: 1. Bilateral varicoceles. 2. No intratesticular abnormality. Blood flow is demonstrated to both testicles. 3. Small amount of fluid on the right. 4. Question of small 9 mm left scrotal pearl. Electronically Signed   By: Ivar Drape M.D.   On: 09/28/2016 09:35   [place] Art/ven Flow Abd Pelv Doppler  Result Date: 09/28/2016 CLINICAL DATA:  Testicular pain for several years, left-greater-than-right, diabetes, high. EXAM: ULTRASOUND OF SCROTUM TECHNIQUE: Complete ultrasound examination of the testicles, epididymis, and other scrotal structures was performed. COMPARISON:  None. FINDINGS: Right testicle Measurements: The right testicle measures 3.4 x 1.6 x 2.6 cm. No intratesticular abnormality is seen. Blood flow is demonstrated to the right testicle with arterial and venous waveforms. Left testicle Measurements: The left testicle measures 3.4 x 1.7 x 2.5 cm. No intratesticular abnormality is seen. Blood flow is demonstrated to the left testicle with arterial and venous waveforms. Right epididymis:  The right epididymis is unremarkable. Left epididymis:  The left epididymis also is unremarkable. Hydrocele:  A small amount of fluid is noted on the  right. 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 A small extratesticular calcification is present of 9 mm on the left probably representing a scrotal pearl. IMPRESSION: 1. Bilateral varicoceles. 2. No intratesticular abnormality. Blood flow is demonstrated to both testicles. 3. Small amount of fluid on the right. 4. Question of small 9 mm left scrotal pearl. Electronically Signed   By: Ivar Drape M.D.   On: 09/28/2016 09:35    Microbiology: No results found for this or any previous visit (from the past 240 hour(s)).   Labs: Basic Metabolic Panel:  Recent Labs Lab 09/27/16 1203 09/27/16 1449 09/27/16 1823 09/28/16 0546 09/29/16 0325 09/30/16 0440  NA 132* 132*   --  136 138 138  K 4.5 4.4  --  3.6 3.5 3.7  CL 98* 103  --  108 108 106  CO2 20* 22  --  21* 24 25  GLUCOSE 521* 462*  --  226* 197* 195*  BUN 23* 25*  --  25* 24* 22*  CREATININE 1.69* 1.67*  --  1.41* 1.49* 1.37*  CALCIUM 9.3 8.6*  --  8.3* 8.4* 8.6*  MG  --   --  2.0  --  2.0  --    Liver Function Tests:  Recent Labs Lab 09/27/16 1331  AST 22  ALT 24  ALKPHOS 90  BILITOT 1.2  PROT 7.0  ALBUMIN 4.1   No results for input(s): LIPASE, AMYLASE in the last 168 hours. No results for input(s): AMMONIA in the last 168 hours. CBC:  Recent Labs Lab 09/27/16 1203 09/27/16 1823 09/28/16 0546 09/29/16 0325 09/30/16 0440  WBC 7.8 8.3 6.2 6.5 8.3  NEUTROABS  --   --   --  4.2  --   HGB 15.7 14.7 13.1 12.8* 12.4*  HCT 43.4 41.0 37.6* 37.1* 36.1*  MCV 85.3 85.2 86.4 86.1 86.6  PLT 256 222 189 187 180   Cardiac Enzymes:  Recent Labs Lab 09/27/16 1823 09/27/16 2333 09/28/16 0546  TROPONINI 0.03* 0.03* 0.03*   BNP: BNP (last 3 results)  Recent Labs  09/27/16 1331  BNP 284.3*    ProBNP (last 3 results) No results for input(s): PROBNP in the last 8760 hours.  CBG:  Recent Labs Lab 09/29/16 1202 09/29/16 1633 09/29/16 2031 09/30/16 0558 09/30/16 1133  GLUCAP 103* 319* 161* 209* 269*       Signed:  Cristal Ford  [facility] 09/30/2016, 2:11 PM

## 2016-09-30 NOTE — Progress Notes (Signed)
Patient Name: [name] [name] Date of Encounter: 09/30/2016  Primary Cardiologist: Dr.Jordan EP: Dr. [facility] Problem List     Principal Problem:   Atrial flutter with rapid ventricular response [facility]) Active Problems:   Hypertension   Hyperlipidemia   Automatic implantable cardioverter-defibrillator in situ   DM (diabetes mellitus), type 2 (HCC)   Dizziness   Chest pain   Shortness of breath   S/P coronary artery stent placement   Hyperglycemia     Subjective   Feeling well. No chest pain or dyspnea.   Inpatient Medications    Scheduled Meds: . aspirin EC  81 mg Oral Daily  . atorvastatin  10 mg Oral Daily  . carvedilol  25 mg Oral BID WC  . coumadin book   Does not apply Once  . FLUoxetine  40 mg Oral Daily  . insulin aspart  0-15 Units Subcutaneous TID WC  . insulin aspart  0-5 Units Subcutaneous QHS  . insulin detemir  50 Units Subcutaneous QHS  . linagliptin  5 mg Oral Daily  . metoprolol  5 mg Intravenous Once  . pantoprazole  40 mg Oral Daily  . tamsulosin  0.4 mg Oral Daily  . warfarin  1 mg Oral ONCE-1800  . Warfarin - Pharmacist Dosing Inpatient   Does not apply q1800   Continuous Infusions: . sodium chloride    . amiodarone 30 mg/hr (09/30/16 0417)   PRN Meds: acetaminophen, metoprolol, ondansetron (ZOFRAN) IV   Vital Signs    Vitals:   09/30/16 0420 09/30/16 0754 09/30/16 1014 09/30/16 1136  BP: (!) 132/91 123/69 (!) 136/93 125/74  Pulse: 73 67 69 63  Resp: 18 18 18 18   Temp: 97.8 F (36.6 C) 97.6 F (36.4 C) 97.7 F (36.5 C) 97.7 F (36.5 C)  TempSrc: Oral Oral Oral Oral  SpO2: 96% 100% 100% 100%  Weight: 229 lb 8 oz (104.1 kg)     Height:        Intake/Output Summary (Last 24 hours) at 09/30/16 1221 Last data filed at 09/30/16 1010  Gross per 24 hour  Intake             1440 ml  Output             1920 ml  Net             -480 ml   Filed Weights   09/29/16 0558 09/29/16 1008 09/30/16 0420  Weight: 226 lb 1.6  oz (102.6 kg) 226 lb 1.6 oz (102.6 kg) 229 lb 8 oz (104.1 kg)    Physical Exam    GEN: Well nourished, well developed, in no acute distress.  HEENT: Grossly normal.  Neck: Supple, no JVD, carotid bruits, or masses. Cardiac: RRR, no murmurs, rubs, or gallops. No clubbing, cyanosis, edema.  Radials/DP/PT 2+ and equal bilaterally.  Respiratory:  Respirations regular and unlabored, clear to auscultation bilaterally. GI: Soft, nontender, nondistended, BS + x 4. MS: no deformity or atrophy. Skin: warm and dry, no rash. Neuro:  Strength and sensation are intact. Psych: AAOx3.  Normal affect.  Labs    CBC  Recent Labs  09/29/16 0325 09/30/16 0440  WBC 6.5 8.3  NEUTROABS 4.2  --   HGB 12.8* 12.4*  HCT 37.1* 36.1*  MCV 86.1 86.6  PLT 187 053   Basic Metabolic Panel  Recent Labs  09/27/16 1823  09/29/16 0325 09/30/16 0440  NA  --   < > 138 138  K  --   < >  3.5 3.7  CL  --   < > 108 106  CO2  --   < > 24 25  GLUCOSE  --   < > 197* 195*  BUN  --   < > 24* 22*  CREATININE  --   < > 1.49* 1.37*  CALCIUM  --   < > 8.4* 8.6*  MG 2.0  --  2.0  --   < > = values in this interval not displayed. Liver Function Tests  Recent Labs  09/27/16 1331  AST 22  ALT 24  ALKPHOS 90  BILITOT 1.2  PROT 7.0  ALBUMIN 4.1   No results for input(s): LIPASE, AMYLASE in the last 72 hours. Cardiac Enzymes  Recent Labs  09/27/16 1823 09/27/16 2333 09/28/16 0546  TROPONINI 0.03* 0.03* 0.03*   BNP Invalid input(s): POCBNP D-Dimer No results for input(s): DDIMER in the last 72 hours. Hemoglobin A1C  Recent Labs  09/27/16 1824  HGBA1C 12.8*   Fasting Lipid Panel  Recent Labs  09/30/16 0440  CHOL 116  HDL 26*  LDLCALC 58  TRIG 162*  CHOLHDL 4.5   Thyroid Function Tests  Recent Labs  09/27/16 1824  TSH 2.313    Telemetry    NSR with rate PVC rate of 60-70s- Personally Reviewed  ECG    N/A  Radiology    No results found.  Cardiac Studies    TEE  09/29/16 LV EF: 20% -   25%  ------------------------------------------------------------------- Indications:      Atrial fibrillation - 427.31.  ------------------------------------------------------------------- History:   PMH:   Murmur.  Coronary artery disease.  Congestive heart failure.  Mitral valve prolapse.  Risk factors: Hypertension. Diabetes mellitus.  ------------------------------------------------------------------- Study Conclusions  - Left ventricle: Systolic function was severely reduced. The   estimated ejection fraction was in the range of 20% to 25%.   Diffuse hypokinesis. - Aortic valve: No evidence of vegetation. There was trivial   regurgitation. - Mitral valve: Prior procedures included surgical repair. There   was moderate regurgitation. - Left atrium: The atrium was moderately dilated. No evidence of   thrombus in the atrial cavity or appendage. - Right ventricle: Systolic function was moderately reduced. - Right atrium: No evidence of thrombus in the atrial cavity or   appendage. - Atrial septum: No defect or patent foramen ovale was identified. - Tricuspid valve: No evidence of vegetation. - Pulmonic valve: No evidence of vegetation.  Impressions:  - Severe global reduction in LV function; trace AI; s/p MV repair   with moderate MR; moderate LAE; moderately reduced RV function;   mild TR.  Patient Profile     [name] is a 49M with CAD, chronic systolic and diastolic heart failure, s/p ICD, mitral valve prolapse s/p annuloplasty, hypertension and hyuperlipidemia admitted with new onset atypical atrial flutter.   Assessment & Plan    1. Paroxysmal atrial fibrillation/flutter: On further review of his chart it appears that he had atrial fibrillation on his ICD check in 2010. There has not been any recurrence clinically since that time. Rates on admission were in the 140s.  Rates were poorly controlled and his blood pressure was low so  amiodarone was added in addition to metoprolol. S/p successful TEE/DCCV. No thrombus. On coumadin for anticoagulation. Will f/u in coumadin clinic at [place]. Maintaining sinus rhythm. Will discontinue IV amiodarone. May consider long term po amiodarone if recurrent afib. Continue coreg 25mg  BID.   2. Chronic systolic and diastolic heart failure/ ICM: LVEF  30-35% on echo 12/2014. EF of 20-25% on TEE with trace AI; s/p MV repair with moderate MR; moderate LAE; moderately reduced RV function; mild TR. - His reduced likely likely tachycardia mediated.  -  No of HF currently. He was eating lots of salt at home. Discussed life style change in details.  - Valsartan on hold due to AKI. Baseline Scr around 1.3. Seems very close. BP stable now. Resume valsartan at half dose to 80mg  qd. Continue to hold spironolactone. Will resume as outpatient.    3.  CAD: No active angina. He is s/p multiple interventions,  most recently 01/2015 at which time he had drug-eluting stents placed in OM1 and D1. Troponin was mildly elevated to 0.03, likely demand ischemia. Continue statin and aspirin. He ran out of Plavix 3 weeks ago--> now discontinued due to need of anticoagulation for atrial fibrillation.   4.  Hypertension: Stable today. As above.   5.  Hyperlipidemia: Continue atorvastatin.  09/30/2016: Cholesterol 116; HDL 26; LDL Cholesterol 58; Triglycerides 162; VLDL 32   6. DM: A1c of 12.8% on 09/27/16. Needs aggressive control. F/u with PCP. Discussed lifestyle change.    Will need BEMT in 1 week post hospital appointment. Coumadin check Monday at [facility].   Jarrett Soho, PA  09/30/2016, 12:21 PM

## 2016-10-01 ENCOUNTER — Other Ambulatory Visit: Payer: Self-pay

## 2016-10-01 NOTE — Patient Outreach (Signed)
[facility]) Care Management  10/01/16  [name] [name] Apr 12, 1951 [id number]  Successful outreach completed with patient. Patient identification verified. RNCM educated patient regarding [place] management and RNCM role and patient was very receptive and agreed to outreach.  Patient stated that he was discharged yesterday. Stated that his heart "went crazy" and was beating rapidly. Stated that while he was in the hospital, they did a procedure to check for blood clots and "zapped" his heart back into rhythm. He reports that he was told he was in atrial fibrillation and his plavix was changed to coumadin this visit.  Patient reported that he and his wife receive SSI only. He stated that he used to work in [employer] until he went into cardiac arrest in 1994 and he was told he could not do that anymore. He stated that was the only work he ever knew and reported he was on disability until it transitioned to social security when he turned 9.   Patient stated that he and his wife are worried about his medications and copays. He stated that he often has to go a long time without his insulin because he cannot afford it. He reports that he does have all of his medications currently. He stated that his doctor did try to change as many of his medications to the $4 list at [facility], but his insulin and supplies is still expensive. He reports that his blood sugar was over 500 when he was in the hospital. He reports that he checks his blood sugar 4 times per day ("sometimes"). Patient stated that he had a blood pressure cuff, but it is broken. Also reports that he has scales and weighs daily. Stated that his weight always ranges between 220-230. They report that one of their main issues is that he has so many follow up appointments in [place] and they do not have gas money. If his appointments are closer to the first of the month, they usually have money to go, but they are limited.  RNCM advised may be able to get coumadin checks changed to PCP since that office is closer and they are agreeable.   Patient ended call stating "you can go over everything when you come out to visit." Stated he would have medications available and we could go over rest of needs.   Plan: Home visit scheduled for next week.  [place] R. Theadore Blunck, RN, BSN, [place] Management Coordinator [phone]

## 2016-10-04 ENCOUNTER — Ambulatory Visit: Payer: Commercial Managed Care - HMO

## 2016-10-06 ENCOUNTER — Ambulatory Visit: Payer: Self-pay

## 2016-10-07 ENCOUNTER — Ambulatory Visit: Payer: Commercial Managed Care - HMO | Admitting: Cardiology

## 2016-10-07 ENCOUNTER — Other Ambulatory Visit: Payer: Self-pay

## 2016-10-07 NOTE — Patient Outreach (Signed)
[facility]) Care Management   10/07/2016  [name] [name] Oct 09, 1951 [id number]  [name] [name] is an 65 y.o. male  Subjective: Initial home visit completed with patient.  Objective:   Vitals:   10/07/16 1415  BP: 118/82  Pulse: 70  Resp: 20    ROS  Physical Exam  Encounter Medications:   Outpatient Encounter Prescriptions as of 10/07/2016  Medication Sig  . aspirin EC 81 MG tablet Take 81 mg by mouth daily.  Marland Kitchen atorvastatin (LIPITOR) 20 MG tablet Take 10 mg by mouth daily.   . blood glucose meter kit and supplies Dispense based on patient and insurance preference. Use up to four times daily as directed. (FOR ICD-9 250.00, 250.01).  . carvedilol (COREG) 25 MG tablet Take 1 tablet (25 mg total) by mouth 2 (two) times daily with a meal.  . FLUoxetine (PROZAC) 40 MG capsule Take 40 mg by mouth daily.  . insulin aspart protamine- aspart (NOVOLOG MIX 70/30) (70-30) 100 UNIT/ML injection Inject 0.35 mLs (35 Units total) into the skin 2 (two) times daily with a meal.  . Insulin Pen Needle 31G X 5 MM MISC Use with Insulin  . nitroGLYCERIN (NITROSTAT) 0.4 MG SL tablet Place 1 tablet (0.4 mg total) under the tongue every 5 (five) minutes x 3 doses as needed for chest pain.  Marland Kitchen omeprazole (PRILOSEC) 40 MG capsule Take 40 mg by mouth daily.  . pantoprazole (PROTONIX) 40 MG tablet Take 1 tablet (40 mg total) by mouth daily.  . sitaGLIPtin (JANUVIA) 100 MG tablet Take 100 mg by mouth daily.  . tamsulosin (FLOMAX) 0.4 MG CAPS capsule Take 1 capsule (0.4 mg total) by mouth daily.  . valsartan (DIOVAN) 80 MG tablet Take 1 tablet (80 mg total) by mouth daily.  Marland Kitchen warfarin (COUMADIN) 1 MG tablet Take 1 tablet (1 mg total) by mouth one time only at 6 PM.   No facility-administered encounter medications on file as of 10/07/2016.     Functional Status:   In your present state of health, do you have any difficulty performing the following activities: 09/27/2016   Hearing? N  Vision? N  Difficulty concentrating or making decisions? N  Walking or climbing stairs? N  Dressing or bathing? N  Doing errands, shopping? N  Some recent data might be hidden    Fall/Depression Screening:     Assessment:  Patient reports he has been doing well since discharge from the hospital.  This patient has not yet followed up with his PCP or been back to cardiology to have his INR checked. He reported that he does not have the money to pay for gas to get back and forth to [facility] right now. RNCM educated patient and his wife on the importance of having his INR checked since he has newly been placed on Coumadin. They verbalized understanding, but are just not able to make it to the appointments. RNCM contacted patient's PCP to request an appointment and advised he would need his INR checked as he was recently started on Coumadin and has not had any follow up labs since hospital discharge. She offered an appointment within the next week, but patient stated it would have to be on November 3 or later when his monthly checks arrived. Next available appointment was on November 6 and patient was agreeable with this date.  RNCM spent a lot of time educating patient on monitoring for signs and symptoms of bleeding and bruising. Advised of when to call the  doctor and when to call 911 and patient verbalized understanding. Patient currently has no bruising and reports no cuts or scrapes. Educated to use caution with activities that could cause injury at present and he verbalized understanding. Also educated the rationale behind frequent monitoring of his INR until it reaches therapeutic levels.   Attempted to review patient's blood sugars, but he and his wife share the glucometer, so the readings are for both. He stated that when he was discharged from the hospital, an order was written for needles that go to an insluin pen, but he does not have a pen. Stated that he uses vials of insulin.  His wife reported that they were initially unaware that that was what happened and the pharmacy refused to take them back. They report having difficulty paying for supplies in addition to their medications and that he is having to reuse his diabetic needles.   RNCM contacted [place] in [facility] who holds a needle exchange program and was able to secure the patient some free needles. RNCM will provide contact information to patient and his wife so that they can reach out to this program when they need to get new insulin needles.  Plan:  Patient reports no other concerns at present. He and his wife had to pick up their grandchildren. Will continue outreach later this month to ensure that patient has followed up with his PCP to have his INR checked and will complete assessment items that were not completed during home visit due to more urgent needs.  [name], RN, BSN, [place] Management Coordinator [phone]

## 2016-10-08 ENCOUNTER — Encounter: Payer: Self-pay | Admitting: *Deleted

## 2016-10-18 DIAGNOSIS — R791 Abnormal coagulation profile: Secondary | ICD-10-CM | POA: Diagnosis not present

## 2016-10-18 DIAGNOSIS — Z09 Encounter for follow-up examination after completed treatment for conditions other than malignant neoplasm: Secondary | ICD-10-CM | POA: Diagnosis not present

## 2016-10-18 DIAGNOSIS — E669 Obesity, unspecified: Secondary | ICD-10-CM | POA: Diagnosis not present

## 2016-10-18 DIAGNOSIS — Z6831 Body mass index (BMI) 31.0-31.9, adult: Secondary | ICD-10-CM | POA: Diagnosis not present

## 2016-10-19 ENCOUNTER — Encounter: Payer: Self-pay | Admitting: Nurse Practitioner

## 2016-10-19 NOTE — Progress Notes (Signed)
Electrophysiology Office Note Date: 10/20/2016  ID:  [name] [name], DOB July 27, 1951, MRN [id number]  PCP: Garwin Brothers, MD Primary Cardiologist: [place] Electrophysiologist: Allred  CC: Routine ICD follow-up  [name] [name] is a 66 y.o. male seen today for Dr Rayann Heman.  He presents today for routine electrophysiology followup.  He was admitted last month with dizziness and chest heaviness and was found to be in atrial flutter.  He was started on Warfarin for anticoagulation and cardioverted prior to discharge. Since discharge, he reports doing very well.   He denies chest pain, palpitations, dyspnea, PND, orthopnea, nausea, vomiting, dizziness, syncope, edema, weight gain, or early satiety.  He has not had ICD shocks. INR was 1.5 last week and is being followed by [facility]. He has not had bleeding issues. Importance of compliance with Warfarin discussed today.   Device History: STJ dual chamber ICD implanted 2007 for ICM; gen change 2014 History of appropriate therapy: No History of AAD therapy: Yes - amiodarone for AF   Past Medical History:  Diagnosis Date  . Anxiety   . CAD (coronary artery disease)    a.  1993 s/p MI - [employer];  b. s/p BMS to LAD '00;  c. PTCA 2nd diagonal 2010;  d. 02/18/12 Cath: moderate nonobs dzs - med rx;  e.  01/2015 Cath: LM nl, LAD 40-35mISR, 70-869m, d1 90p (3.0x16 Synergy DES), D2 50-60, LCX nl, OM1 50p, 8052m.5x12 Synergy DES), RCA nl, EF 30-35%.  . Chronic combined systolic and diastolic CHF (congestive heart failure) ([place]CC[place]  a. 12/2014 Echo: EF 30-35%, Gr2 DD, mod MR, sev dil LA.  . CKD (chronic kidney disease), stage III   . Depression   . ED (erectile dysfunction)   . GERD (gastroesophageal reflux disease)   . Hyperlipidemia   . Hypertension   . Ischemic cardiomyopathy    a. s/p St. [name] (Atlas) ICD [place];  b. 12/2014 Echo: Ef 30-35%.  . MVP (mitral valve prolapse)    [facility]. [facility]  . PMarland Kitchenrsistent atrial fibrillation (HCCGray  a. noted on ICD interrogation '10 - not previously on OACBangsCHA2DS2VASc = 5.  . Type II diabetes mellitus (HCCTruman  uncontrolled   Past Surgical History:  Procedure Laterality Date  . CARDIAC DEFIBRILLATOR PLACEMENT  2007   [place] 7001 RV lead and a SJM Atlas ICD followed by Dr KleCaryl Comes CARDIOVERSION N/A 09/29/2016   Procedure: CARDIOVERSION;  Surgeon: BriLelon PerlaD;  [facility];  Service: Cardiovascular;  Laterality: N/A;  . IMPLANTABLE CARDIOVERTER DEFIBRILLATOR (ICD) GENERATOR CHANGE N/A 03/19/2013   SJM Fortify ST DR generator placed by Dr TayLovena Leart of Analyze ST study  . LEFT AND RIGHT HEART CATHETERIZATION WITH CORONARY ANGIOGRAM N/A 01/14/2015   Procedure: LEFT AND RIGHT HEART CATHETERIZATION WITH CORONARY ANGIOGRAM;  Surgeon: Peter M JorMartiniqueD;  [facility];  Service: Cardiovascular;  Laterality: N/A;  . LEFT HEART CATHETERIZATION WITH CORONARY ANGIOGRAM N/A 02/17/2012   Procedure: LEFT HEART CATHETERIZATION WITH CORONARY ANGIOGRAM;  Surgeon: DanJolaine ArtistD;  [facility];  Service: Cardiovascular;  Laterality: N/A;  . MITRAL VALVE ANNULOPLASTY  2004   /noArchie Endo7/2013  . TEE WITHOUT CARDIOVERSION N/A 09/29/2016   Procedure: TRANSESOPHAGEAL ECHOCARDIOGRAM (TEE);  Surgeon: BriLelon PerlaD;  [facility];  Service: Cardiovascular;  Laterality: N/A;    Current  Outpatient Prescriptions  Medication Sig Dispense Refill  . aspirin EC 81 MG tablet Take 81 mg by mouth daily.    Marland Kitchen atorvastatin (LIPITOR) 20 MG tablet Take 10 mg by mouth daily.     . blood glucose meter kit and supplies Dispense based on patient and insurance preference. Use up to four times daily as directed. (FOR ICD-9 250.00, 250.01). 1 each 0  . carvedilol (COREG) 25 MG tablet Take 1 tablet (25 mg total) by mouth 2 (two) times daily with a meal. 180 tablet 4  . FLUoxetine (PROZAC)  20 MG tablet Take 20 mg by mouth daily.    Marland Kitchen FLUoxetine (PROZAC) 40 MG capsule Take 40 mg by mouth daily.    . insulin aspart protamine- aspart (NOVOLOG MIX 70/30) (70-30) 100 UNIT/ML injection Inject 0.35 mLs (35 Units total) into the skin 2 (two) times daily with a meal. 10 mL 2  . Insulin Pen Needle 31G X 5 MM MISC Use with Insulin 100 each 0  . omeprazole (PRILOSEC) 40 MG capsule Take 40 mg by mouth daily.    . pantoprazole (PROTONIX) 40 MG tablet Take 1 tablet (40 mg total) by mouth daily. 90 tablet 3  . sitaGLIPtin (JANUVIA) 100 MG tablet Take 100 mg by mouth daily.    . tamsulosin (FLOMAX) 0.4 MG CAPS capsule Take 1 capsule (0.4 mg total) by mouth daily. 30 capsule 0  . valsartan (DIOVAN) 80 MG tablet Take 1 tablet (80 mg total) by mouth daily. 30 tablet 0  . warfarin (COUMADIN) 1 MG tablet Take 1 tablet (1 mg total) by mouth one time only at 6 PM. 30 tablet 0  . nitroGLYCERIN (NITROSTAT) 0.4 MG SL tablet Place 1 tablet (0.4 mg total) under the tongue every 5 (five) minutes x 3 doses as needed for chest pain. (Patient not taking: Reported on 10/20/2016) 25 tablet 12   No current facility-administered medications for this visit.     Allergies:   Metformin and related and Lisinopril   Social History: Social History   Social History  . Marital status: Married    Spouse name: N/A  . Number of children: 8  . Years of education: N/A   Occupational History  . lawmaker     disabled   Social History Main Topics  . Smoking status: Never Smoker  . Smokeless tobacco: Never Used  . Alcohol use 0.0 oz/week     Comment: 01/14/2015 "used to drink alot; stopped completely in 1983"  . Drug use:      Comment: 01/14/2015 "used to use about anything I could get; stopped completely in 1983"  . Sexual activity: No   Other Topics Concern  . Not on file   Social History Narrative  . No narrative on file    Family History: Family History  Problem Relation Age of Onset  . Coronary artery  disease Mother   . Colon cancer Mother   . Heart attack Mother   . Heart disease Mother   . Heart failure Mother   . Hypertension Mother   . Malignant hyperthermia Mother   . Colon cancer Sister   . Coronary artery disease Brother   . Heart disease Brother     Review of Systems: All other systems reviewed and are otherwise negative except as noted above.   Physical Exam: VS:  BP (!) 138/92 (BP Location: Left Arm, Patient Position: Sitting, Cuff Size: Large)   Pulse 62   Ht 6' (1.829 m)   [employer]  231 lb 3.2 oz (104.9 kg)   SpO2 99%   BMI 31.36 kg/m  , BMI Body mass index is 31.36 kg/m.  GEN- The patient is elderly appearing, alert and oriented x 3 today.   HEENT: normocephalic, atraumatic; sclera clear, conjunctiva pink; hearing intact; oropharynx clear; neck supple, poor dentition Lungs- Clear to ausculation bilaterally, normal work of breathing.  No wheezes, rales, rhonchi Heart- Regular rate and rhythm, no murmurs, rubs or gallops  GI- soft, non-tender, non-distended, bowel sounds present  Extremities- no clubbing, cyanosis, or edema  MS- no significant deformity or atrophy Skin- warm and dry, no rash or lesion; ICD pocket well healed Psych- euthymic mood, full affect Neuro- strength and sensation are intact  ICD interrogation- reviewed in detail today,  See PACEART report  EKG:  EKG is not ordered today.  Recent Labs: 09/27/2016: ALT 24; B Natriuretic Peptide 284.3; TSH 2.313 09/29/2016: Magnesium 2.0 09/30/2016: BUN 22; Creatinine, Ser 1.37; Hemoglobin 12.4; Platelets 180; Potassium 3.7; Sodium 138   Wt Readings from Last 3 Encounters:  10/20/16 231 lb 3.2 oz (104.9 kg)  09/30/16 229 lb 8 oz (104.1 kg)  04/22/15 227 lb 11.2 oz (103.3 kg)     Other studies Reviewed: Additional studies/ records that were reviewed today include: Dr Jackalyn Lombard office notes, hospital records   Assessment and Plan:  1.  Chronic systolic dysfunction euvolemic today Stable on an  appropriate medical regimen Normal ICD function See Pace Art report No changes today I have discussed SJM Fortify Assura advisary with the patient today. He understands that recommendation from SJM is to not replace the device at this time. The patient is not device dependant.  The patient has not had appropriate device therapy in the past or implanted for secondary prevention.  Vibratory alert demonstrated today.  He is actively remotely monitored and understands the importance of compliance today.   2.  HTN Stable No change required today  3.  CAD No recent ischemic symptoms Continue medical therapy Plavix was discontinued 09/2016 when Warfarin started. Will defer to Dr Martinique continuing ASA  4.  Persistent atrial fibrillation/flutter Maintaining SR post cardioversion 09/2016 Continue warfarin for CHADS2VASC of 5 CBC today  If he has recurrent atrial arrhythmias, Amiodarone is only AAD option with CAD and CKD   Current medicines are reviewed at length with the patient today.   The patient does not have concerns regarding his medicines.  The following changes were made today:  none  Labs/ tests ordered today include: none Orders Placed This Encounter  Procedures  . Implantable device check    Disposition:   Follow up with Delilah Shan, ICM clinic, Dr Rayann Heman 1 year, Dr Martinique 6 months   Signed, Chanetta Marshall, NP 10/20/2016 9:05 AM  [facility] [address] [place] [address] [phone] (office) [phone] (fax)

## 2016-10-20 ENCOUNTER — Encounter: Payer: Self-pay | Admitting: Nurse Practitioner

## 2016-10-20 ENCOUNTER — Ambulatory Visit (INDEPENDENT_AMBULATORY_CARE_PROVIDER_SITE_OTHER): Payer: Commercial Managed Care - HMO | Admitting: Nurse Practitioner

## 2016-10-20 VITALS — BP 138/92 | HR 62 | Ht 72.0 in | Wt 231.2 lb

## 2016-10-20 DIAGNOSIS — I481 Persistent atrial fibrillation: Secondary | ICD-10-CM | POA: Diagnosis not present

## 2016-10-20 DIAGNOSIS — I10 Essential (primary) hypertension: Secondary | ICD-10-CM | POA: Diagnosis not present

## 2016-10-20 DIAGNOSIS — I5022 Chronic systolic (congestive) heart failure: Secondary | ICD-10-CM

## 2016-10-20 DIAGNOSIS — I4819 Other persistent atrial fibrillation: Secondary | ICD-10-CM

## 2016-10-20 LAB — CUP PACEART INCLINIC DEVICE CHECK
Implantable Lead Implant Date: 20070516
Implantable Lead Location: 753860
Implantable Lead Model: 7001
MDC IDC LEAD IMPLANT DT: 20070516
MDC IDC LEAD LOCATION: 753859
MDC IDC PG IMPLANT DT: 20140407
MDC IDC SESS DTM: 20171108090429
Pulse Gen Serial Number: 1056971

## 2016-10-20 NOTE — Patient Instructions (Addendum)
Medication Instructions:   Your physician recommends that you continue on your current medications as directed. Please refer to the Current Medication list given to you today.    If you need a refill on your cardiac medications before your next appointment, please call your pharmacy.  Labwork: NONE ORDERED  TODAY    Testing/Procedures: NONE ORDERED  TODAY    Follow-Up:  Your physician wants you to follow-up in: [place] will receive a reminder letter in the mail two months in advance. If you don't receive a letter, please call our office to schedule the follow-up appointment.   Your physician wants you to follow-up in:  IN 6  MONTHS WITH DR Martinique You will receive a reminder letter in the mail two months in advance. If you don't receive a letter, please call our office to schedule the follow-up appointment.  Remote monitoring is used to monitor your Pacemaker of ICD from home. This monitoring reduces the number of office visits required to check your device to one time per year. It allows [place] to keep an eye on the functioning of your device to ensure it is working properly. You are scheduled for a device check from home on .01/19/17.You may send your transmission at any time that day. If you have a wireless device, the transmission will be sent automatically. After your physician reviews your transmission, you will receive a postcard with your next transmission date.      Any Other Special Instructions Will Be Listed Below (If Applicable).

## 2016-10-25 DIAGNOSIS — R791 Abnormal coagulation profile: Secondary | ICD-10-CM | POA: Diagnosis not present

## 2016-10-29 ENCOUNTER — Other Ambulatory Visit: Payer: Self-pay

## 2016-10-29 NOTE — Patient Outreach (Signed)
[facility]) Care Management  10/29/16   Falls 1951-11-08 [id number]  Successful outreach completed with patient. Patient identification verified. Patient stated he was not at home right now, so call kept brief.  Home visit scheduled for next week.  [place] R. Galit Urich, RN, BSN, [place] Management Coordinator [phone]

## 2016-11-01 DIAGNOSIS — Z7901 Long term (current) use of anticoagulants: Secondary | ICD-10-CM | POA: Diagnosis not present

## 2016-11-02 ENCOUNTER — Other Ambulatory Visit: Payer: Self-pay

## 2016-11-02 NOTE — Patient Outreach (Signed)
[facility]) Care Management  [place]  11/02/2016   Guthrie Lemme Mcgrady [birth year] [id number]  Subjective: Home visit completed with patient.  Objective:  Vitals:   11/02/16 1114  BP: (!) 168/107  Pulse: 63  Resp: 18    Encounter Medications:  Outpatient Encounter Prescriptions as of 11/02/2016  Medication Sig  . aspirin EC 81 MG tablet Take 81 mg by mouth daily.  Marland Kitchen atorvastatin (LIPITOR) 20 MG tablet Take 10 mg by mouth daily.   . blood glucose meter kit and supplies Dispense based on patient and insurance preference. Use up to four times daily as directed. (FOR ICD-9 250.00, 250.01).  . carvedilol (COREG) 25 MG tablet Take 1 tablet (25 mg total) by mouth 2 (two) times daily with a meal.  . FLUoxetine (PROZAC) 20 MG tablet Take 20 mg by mouth daily.  Marland Kitchen FLUoxetine (PROZAC) 40 MG capsule Take 40 mg by mouth daily.  . insulin aspart protamine- aspart (NOVOLOG MIX 70/30) (70-30) 100 UNIT/ML injection Inject 0.35 mLs (35 Units total) into the skin 2 (two) times daily with a meal.  . Insulin Pen Needle 31G X 5 MM MISC Use with Insulin  . nitroGLYCERIN (NITROSTAT) 0.4 MG SL tablet Place 1 tablet (0.4 mg total) under the tongue every 5 (five) minutes x 3 doses as needed for chest pain. (Patient not taking: Reported on 10/20/2016)  . omeprazole (PRILOSEC) 40 MG capsule Take 40 mg by mouth daily.  . pantoprazole (PROTONIX) 40 MG tablet Take 1 tablet (40 mg total) by mouth daily.  . sitaGLIPtin (JANUVIA) 100 MG tablet Take 100 mg by mouth daily.  . tamsulosin (FLOMAX) 0.4 MG CAPS capsule Take 1 capsule (0.4 mg total) by mouth daily.  . valsartan (DIOVAN) 80 MG tablet Take 1 tablet (80 mg total) by mouth daily.  Marland Kitchen warfarin (COUMADIN) 1 MG tablet Take 1 tablet (1 mg total) by mouth one time only at 6 PM.   No facility-administered encounter medications on file as of 11/02/2016.     Functional Status:  In your present state of health, do you have any difficulty  performing the following activities: 10/07/2016 09/27/2016  Hearing? Y N  Vision? N N  Difficulty concentrating or making decisions? Y N  Walking or climbing stairs? N N  Dressing or bathing? N N  Doing errands, shopping? Y N  Preparing Food and eating ? N -  Using the Toilet? N -  In the past six months, have you accidently leaked urine? Y -  Do you have problems with loss of bowel control? Y -  Managing your Medications? N -  Managing your Finances? N -  Housekeeping or managing your Housekeeping? Y -  Some recent data might be hidden    Fall/Depression Screening: PHQ 2/9 Scores 10/07/2016  PHQ - 2 Score 0    Assessment: Patient reported that he went to his doctor's office yesterday and had his INR checked. Stated that his INR 2.8. He is able to verbalize that the goal is to have his INR between 2 and 3, and that his doctor does not want it greater than 3, so he has changed his Coumadin dose. He stated that he takes 1 mg rotating with 2 mg.  He stated that he has an appointment again on Monday and stated that he understands that he will have to go weekly until his levels are where they need to be and that after that he will continue to follow up at least  monthly.   Patient reported that he has not yet checked his blood sugar today. He stated that he usually checks it 2-3 times per day, but has not checked it yet. He stated that he took ot last night and it was around 245-246 non-fasting at bedtime.    Patient has no bruising and has had no cuts or bleeding. During this visit, RNCM provided education about bleeding and diet while on anticoagulation therapy and also watched an EMMI video about taking Coumadin. Patient did not have any questions and was able to repeat back everything  That he learned.   Patient has no concerns or needs this visit.   Plan: Will outreach patient in the next couple of weeks to continue to monitor and provide education.  [place] R. Lismary Kiehn, RN, BSN,  [place] Management Coordinator [phone]

## 2016-11-08 DIAGNOSIS — Z7901 Long term (current) use of anticoagulants: Secondary | ICD-10-CM | POA: Diagnosis not present

## 2016-11-15 DIAGNOSIS — Z7901 Long term (current) use of anticoagulants: Secondary | ICD-10-CM | POA: Diagnosis not present

## 2016-11-22 ENCOUNTER — Other Ambulatory Visit: Payer: Self-pay

## 2016-11-22 DIAGNOSIS — Z7901 Long term (current) use of anticoagulants: Secondary | ICD-10-CM | POA: Diagnosis not present

## 2016-11-22 NOTE — Patient Outreach (Signed)
New referral for this case manager: This patient was reassigned to this case Freight forwarder. Reviewed chart and placed call to patient.  Patient reports that he is now going to [facility] in [place]. Reports he does not know who the MD is. States that he got his labs drawn today for his INR.  States that he does not know when he will see primary MD and does not know name of primary MD.  Reports that he is currently out of almost all of his medications due to inability to afford meds. Reports no insulin for 10 days. While on phone, I requested patient monitor his CBG and his reading is 515.  States this is because he is out of his medications. Patient reports to me that his last Hgb A1c was 12-13.   Patient reports that Mcarthur Rossetti will not mail him anymore medications because he has a balance of 45.00.  Patient reports that he has overdrafted his banking account to pay for medications and he continues to get further behind.   PLAN: I have placed an urgent referral to [place] and to [facility] social work to assist patient with immediate needs.   I have scheduled home visit for 11/29/2016.  I have confirmed address.  I provided my contact information at new case manager.  I will place call to [place] to inquire about who Primary MD is at this time. Will send this note when I know who primary MD is.   [facility] CM Care Plan Problem One   Flowsheet Row Most Recent Value  Care Plan Problem One  Recent hospitalization for paroxysmal a-flutter/a-fib, heart failure, hyperglycemia   Role Documenting the Problem One  Care Management [place] for Problem One  Active  THN Long Term Goal (31-90 days)  Patient will not have a hospitalization within the next 90 days  THN Long Term Goal Start Date  10/01/16  Interventions for Problem One Long Term Goal  Reviewed importance of taking medications as prescribed.   THN CM Short Term Goal #1 (0-30 days)  Patient will attend all scheduled  appointments within the next 30 days  THN CM Short Term Goal #1 Start Date  10/01/16  Interventions for Short Term Goal #1  Reviewed importance of knowing who primary MD is and calling MD for problems and high CBG.   THN CM Short Term Goal #2 (0-30 days)  Patient will adhere to taking all prescribed medications within the next 30 days  THN CM Short Term Goal #2 Start Date  10/01/16  Interventions for Short Term Goal #2  pharmacy and social worker referral placed. Home visit scheduled for 11/29/2016      Tomasa Rand, RN, BSN, [place] Coordinator [phone]

## 2016-11-23 ENCOUNTER — Other Ambulatory Visit: Payer: Self-pay

## 2016-11-23 NOTE — Patient Outreach (Signed)
Care Coordination:  Placed call to Nonie Hoyer with [facility] about patient.  Reviewed with Nonie Hoyer that patient does not know who his Primary provider is and I was informed it is [name] PA. Reviewed concern that patient does not have his medications and can not afford his medications.  Reported that last ngihts CBG was 515 and patient has not had any insulin for 10 days. Ms. Joline Salt discussed with Dr. Megan Salon and some office samples could be provided to patient. These include Aspirin, 70/30 insulin pen, 75/25 insulin vial with needles, prilosec, and [place].  Nonie Hoyer also reports that she will contact drug reps and inquire about other samples that may be available.   Nonie Hoyer also made office visit for patient to see provider on 11/29/2016 for an medication update and evaluation.  Spoke with Deanne Coffer with [facility] who is also assist with securing medications for patient for today.   Placed call to patient and requested patient to go to [facility] office and pick up available samples.  Requested patient ask to speak with Nonie Hoyer.  Address provided. Nonie Hoyer sent a message to primary provider to inform of patient needs and problems with affording medication. Also informed patient for his office visit planned to see provider on 11/29/2016.  PLAN: This case manager will see patient as planned for 11/29/2016 for home visit. This note to be sent to MD. Requested MD be update in EPIC.   Tomasa Rand, RN, BSN, CEN [facility] [employer] [phone]

## 2016-11-23 NOTE — Patient Outreach (Signed)
I received an urgent referral from Tomasa Rand, RN care coordinator for Mr. Cielo in regards to his medications.  He has been out of his medications for about 10 days.  He has a history of his glucose going up to above 500 mg/dl when he is out of his Novolog 70/30.  I called and spoke with Estill Bamberg and determined that all his medications with the exception of Atorvastatin, Carvedilol and Fluoxetine needed to be refilled.  I called [facility] and the medications were ready to be picked up.  I called Mr. Fugitt and explained that he qualified for the Pharmacy Emergency Fund and we would be purchasing his medications for this month.  I stated I would delivery them to him.  I also stated another pharmacist would then be setting up a home visit to determine a long term solution for him to afford his medications.  He stated he would appreciate the help.  I will follow up with him this afternoon.  I have referred him to Bennye Alm, PharmD who will reach out to him to make a home visit.   Deanne Coffer, PharmD, [employer] [phone]

## 2016-11-24 ENCOUNTER — Other Ambulatory Visit: Payer: Self-pay | Admitting: Pharmacist

## 2016-11-24 NOTE — Patient Outreach (Signed)
[facility]) Care Management  11/24/2016  FERD HORRIGAN 02/12/1951 [id number]  65 year old male referred to [place] for medication assistance.  [facility] pharmacist Deanne Coffer, PharmD worked with patient using the [facility] pharmacy emergency fund to provide patient with his medications on the short term.  Called patient today and he states he has picked up all of his medications.  Plan:  Followup home visit on Friday to assess medication assistance and determine a long term solution for patient.  Bennye Alm, PharmD [facility] PGY2 Pharmacy Resident [phone]

## 2016-11-26 ENCOUNTER — Ambulatory Visit: Payer: Self-pay | Admitting: Pharmacist

## 2016-11-29 ENCOUNTER — Telehealth: Payer: Self-pay | Admitting: Cardiology

## 2016-11-29 ENCOUNTER — Encounter (HOSPITAL_COMMUNITY): Payer: Self-pay

## 2016-11-29 ENCOUNTER — Emergency Department (HOSPITAL_COMMUNITY): Payer: Commercial Managed Care - HMO

## 2016-11-29 ENCOUNTER — Inpatient Hospital Stay (HOSPITAL_COMMUNITY)
Admission: EM | Admit: 2016-11-29 | Discharge: 2016-12-03 | DRG: 308 | Disposition: A | Discharge status: Home / Self Care | Payer: Commercial Managed Care - HMO | Attending: Internal Medicine | Admitting: Internal Medicine

## 2016-11-29 ENCOUNTER — Other Ambulatory Visit: Payer: Self-pay

## 2016-11-29 DIAGNOSIS — Z6831 Body mass index (BMI) 31.0-31.9, adult: Secondary | ICD-10-CM | POA: Diagnosis not present

## 2016-11-29 DIAGNOSIS — F419 Anxiety disorder, unspecified: Secondary | ICD-10-CM | POA: Diagnosis present

## 2016-11-29 DIAGNOSIS — Z7901 Long term (current) use of anticoagulants: Secondary | ICD-10-CM

## 2016-11-29 DIAGNOSIS — I340 Nonrheumatic mitral (valve) insufficiency: Secondary | ICD-10-CM | POA: Diagnosis present

## 2016-11-29 DIAGNOSIS — F329 Major depressive disorder, single episode, unspecified: Secondary | ICD-10-CM | POA: Diagnosis present

## 2016-11-29 DIAGNOSIS — I482 Chronic atrial fibrillation, unspecified: Secondary | ICD-10-CM

## 2016-11-29 DIAGNOSIS — K219 Gastro-esophageal reflux disease without esophagitis: Secondary | ICD-10-CM | POA: Diagnosis present

## 2016-11-29 DIAGNOSIS — I959 Hypotension, unspecified: Secondary | ICD-10-CM | POA: Diagnosis present

## 2016-11-29 DIAGNOSIS — I4892 Unspecified atrial flutter: Secondary | ICD-10-CM | POA: Diagnosis not present

## 2016-11-29 DIAGNOSIS — N5082 Scrotal pain: Secondary | ICD-10-CM | POA: Diagnosis present

## 2016-11-29 DIAGNOSIS — E119 Type 2 diabetes mellitus without complications: Secondary | ICD-10-CM | POA: Diagnosis not present

## 2016-11-29 DIAGNOSIS — Z8249 Family history of ischemic heart disease and other diseases of the circulatory system: Secondary | ICD-10-CM

## 2016-11-29 DIAGNOSIS — I5022 Chronic systolic (congestive) heart failure: Secondary | ICD-10-CM | POA: Diagnosis present

## 2016-11-29 DIAGNOSIS — I10 Essential (primary) hypertension: Secondary | ICD-10-CM | POA: Diagnosis present

## 2016-11-29 DIAGNOSIS — E1122 Type 2 diabetes mellitus with diabetic chronic kidney disease: Secondary | ICD-10-CM | POA: Diagnosis present

## 2016-11-29 DIAGNOSIS — Z7982 Long term (current) use of aspirin: Secondary | ICD-10-CM | POA: Diagnosis not present

## 2016-11-29 DIAGNOSIS — Z95810 Presence of automatic (implantable) cardiac defibrillator: Secondary | ICD-10-CM

## 2016-11-29 DIAGNOSIS — R42 Dizziness and giddiness: Secondary | ICD-10-CM | POA: Diagnosis not present

## 2016-11-29 DIAGNOSIS — N183 Chronic kidney disease, stage 3 (moderate): Secondary | ICD-10-CM | POA: Diagnosis present

## 2016-11-29 DIAGNOSIS — I25119 Atherosclerotic heart disease of native coronary artery with unspecified angina pectoris: Secondary | ICD-10-CM | POA: Diagnosis present

## 2016-11-29 DIAGNOSIS — Z955 Presence of coronary angioplasty implant and graft: Secondary | ICD-10-CM

## 2016-11-29 DIAGNOSIS — E669 Obesity, unspecified: Secondary | ICD-10-CM | POA: Diagnosis not present

## 2016-11-29 DIAGNOSIS — I5043 Acute on chronic combined systolic (congestive) and diastolic (congestive) heart failure: Secondary | ICD-10-CM | POA: Diagnosis present

## 2016-11-29 DIAGNOSIS — Z794 Long term (current) use of insulin: Secondary | ICD-10-CM

## 2016-11-29 DIAGNOSIS — R0602 Shortness of breath: Secondary | ICD-10-CM | POA: Diagnosis not present

## 2016-11-29 DIAGNOSIS — I252 Old myocardial infarction: Secondary | ICD-10-CM | POA: Diagnosis not present

## 2016-11-29 DIAGNOSIS — R000 Tachycardia, unspecified: Secondary | ICD-10-CM | POA: Diagnosis not present

## 2016-11-29 DIAGNOSIS — E785 Hyperlipidemia, unspecified: Secondary | ICD-10-CM | POA: Diagnosis present

## 2016-11-29 DIAGNOSIS — I480 Paroxysmal atrial fibrillation: Secondary | ICD-10-CM | POA: Diagnosis present

## 2016-11-29 DIAGNOSIS — I255 Ischemic cardiomyopathy: Secondary | ICD-10-CM | POA: Diagnosis present

## 2016-11-29 DIAGNOSIS — I4891 Unspecified atrial fibrillation: Secondary | ICD-10-CM | POA: Diagnosis present

## 2016-11-29 DIAGNOSIS — I2510 Atherosclerotic heart disease of native coronary artery without angina pectoris: Secondary | ICD-10-CM | POA: Diagnosis not present

## 2016-11-29 DIAGNOSIS — Z888 Allergy status to other drugs, medicaments and biological substances status: Secondary | ICD-10-CM

## 2016-11-29 DIAGNOSIS — I509 Heart failure, unspecified: Secondary | ICD-10-CM | POA: Diagnosis present

## 2016-11-29 DIAGNOSIS — Z79899 Other long term (current) drug therapy: Secondary | ICD-10-CM

## 2016-11-29 DIAGNOSIS — I130 Hypertensive heart and chronic kidney disease with heart failure and stage 1 through stage 4 chronic kidney disease, or unspecified chronic kidney disease: Secondary | ICD-10-CM | POA: Diagnosis present

## 2016-11-29 DIAGNOSIS — E1165 Type 2 diabetes mellitus with hyperglycemia: Secondary | ICD-10-CM | POA: Diagnosis present

## 2016-11-29 HISTORY — DX: Unspecified osteoarthritis, unspecified site: M19.90

## 2016-11-29 HISTORY — DX: Unspecified atrial fibrillation: I48.91

## 2016-11-29 HISTORY — DX: Chronic atrial fibrillation, unspecified: I48.20

## 2016-11-29 LAB — CBC
HEMATOCRIT: 42.4 % (ref 39.0–52.0)
Hemoglobin: 14.8 g/dL (ref 13.0–17.0)
MCH: 30 pg (ref 26.0–34.0)
MCHC: 34.9 g/dL (ref 30.0–36.0)
MCV: 85.8 fL (ref 78.0–100.0)
Platelets: 231 10*3/uL (ref 150–400)
RBC: 4.94 MIL/uL (ref 4.22–5.81)
RDW: 13 % (ref 11.5–15.5)
WBC: 8.3 10*3/uL (ref 4.0–10.5)

## 2016-11-29 LAB — PROTIME-INR
INR: 2.54
PROTHROMBIN TIME: 27.8 s — AB (ref 11.4–15.2)

## 2016-11-29 LAB — COMPREHENSIVE METABOLIC PANEL
ALT: 17 U/L (ref 17–63)
AST: 22 U/L (ref 15–41)
Albumin: 3.7 g/dL (ref 3.5–5.0)
Alkaline Phosphatase: 69 U/L (ref 38–126)
Anion gap: 9 (ref 5–15)
BILIRUBIN TOTAL: 0.8 mg/dL (ref 0.3–1.2)
BUN: 12 mg/dL (ref 6–20)
CO2: 23 mmol/L (ref 22–32)
CREATININE: 1.34 mg/dL — AB (ref 0.61–1.24)
Calcium: 9.2 mg/dL (ref 8.9–10.3)
Chloride: 104 mmol/L (ref 101–111)
GFR, EST NON AFRICAN AMERICAN: 53 mL/min — AB (ref >60)
Glucose, Bld: 273 mg/dL — ABNORMAL HIGH (ref 65–99)
Potassium: 4.1 mmol/L (ref 3.5–5.1)
Sodium: 136 mmol/L (ref 135–145)
TOTAL PROTEIN: 6.3 g/dL — AB (ref 6.5–8.1)

## 2016-11-29 LAB — URINALYSIS, ROUTINE W REFLEX MICROSCOPIC
BACTERIA UA: NONE SEEN
Bilirubin Urine: NEGATIVE
Glucose, UA: >=500 mg/dL — AB
Hgb urine dipstick: NEGATIVE
Ketones, ur: NEGATIVE mg/dL
LEUKOCYTES UA: NEGATIVE
Nitrite: NEGATIVE
PH: 5 (ref 5.0–8.0)
Protein, ur: NEGATIVE mg/dL
RBC / HPF: NONE SEEN RBC/hpf (ref 0–5)
SPECIFIC GRAVITY, URINE: 1.03 (ref 1.005–1.030)
SQUAMOUS EPITHELIAL / LPF: NONE SEEN

## 2016-11-29 LAB — MRSA PCR SCREENING: MRSA BY PCR: NEGATIVE

## 2016-11-29 LAB — I-STAT TROPONIN, ED: TROPONIN I, POC: 0.01 ng/mL (ref 0.00–0.08)

## 2016-11-29 LAB — TROPONIN I: Troponin I: <0.03 ng/mL (ref <0.03)

## 2016-11-29 LAB — APTT: APTT: 45 s — AB (ref 24–36)

## 2016-11-29 LAB — CBG MONITORING, ED: GLUCOSE-CAPILLARY: 313 mg/dL — AB (ref 65–99)

## 2016-11-29 LAB — BRAIN NATRIURETIC PEPTIDE: B NATRIURETIC PEPTIDE 5: 279.8 pg/mL — AB (ref 0.0–100.0)

## 2016-11-29 LAB — GLUCOSE, CAPILLARY: GLUCOSE-CAPILLARY: 181 mg/dL — AB (ref 65–99)

## 2016-11-29 MED ORDER — DILTIAZEM LOAD VIA INFUSION
20.0000 mg | Freq: Once | INTRAVENOUS | Status: AC
  Administered 2016-11-29: 20 mg via INTRAVENOUS
  Filled 2016-11-29: qty 20

## 2016-11-29 MED ORDER — WARFARIN - PHARMACIST DOSING INPATIENT: Freq: Every day | Status: DC

## 2016-11-29 MED ORDER — ATORVASTATIN CALCIUM 20 MG PO TABS
20.0000 mg | ORAL_TABLET | Freq: Every day | ORAL | Status: DC
  Administered 2016-11-30 – 2016-12-02 (×3): 20 mg via ORAL
  Filled 2016-11-29 (×3): qty 1

## 2016-11-29 MED ORDER — INSULIN ASPART 100 UNIT/ML ~~LOC~~ SOLN
0.0000 [IU] | Freq: Three times a day (TID) | SUBCUTANEOUS | Status: DC
  Administered 2016-11-30: 5 [IU] via SUBCUTANEOUS
  Administered 2016-11-30: 2 [IU] via SUBCUTANEOUS
  Administered 2016-11-30: 1 [IU] via SUBCUTANEOUS
  Administered 2016-12-01 (×2): 5 [IU] via SUBCUTANEOUS
  Administered 2016-12-01: 7 [IU] via SUBCUTANEOUS
  Administered 2016-12-02: 5 [IU] via SUBCUTANEOUS
  Administered 2016-12-02: 3 [IU] via SUBCUTANEOUS
  Administered 2016-12-02: 7 [IU] via SUBCUTANEOUS
  Administered 2016-12-03: 1 [IU] via SUBCUTANEOUS
  Administered 2016-12-03: 3 [IU] via SUBCUTANEOUS

## 2016-11-29 MED ORDER — ASPIRIN EC 81 MG PO TBEC
81.0000 mg | DELAYED_RELEASE_TABLET | Freq: Every day | ORAL | Status: DC
  Administered 2016-11-30 – 2016-12-03 (×4): 81 mg via ORAL
  Filled 2016-11-29 (×4): qty 1

## 2016-11-29 MED ORDER — NITROGLYCERIN 0.4 MG SL SUBL: 0.4000 mg | SUBLINGUAL_TABLET | SUBLINGUAL | Status: DC | PRN

## 2016-11-29 MED ORDER — IRBESARTAN 75 MG PO TABS
75.0000 mg | ORAL_TABLET | Freq: Every day | ORAL | Status: DC
  Administered 2016-11-30: 75 mg via ORAL
  Filled 2016-11-29: qty 1

## 2016-11-29 MED ORDER — ACETAMINOPHEN 650 MG RE SUPP: 650.0000 mg | Freq: Four times a day (QID) | RECTAL | Status: DC | PRN

## 2016-11-29 MED ORDER — ALBUTEROL SULFATE (2.5 MG/3ML) 0.083% IN NEBU: 2.5000 mg | INHALATION_SOLUTION | RESPIRATORY_TRACT | Status: DC | PRN

## 2016-11-29 MED ORDER — FLUOXETINE HCL 20 MG PO CAPS
40.0000 mg | ORAL_CAPSULE | Freq: Every day | ORAL | Status: DC
  Administered 2016-11-30 – 2016-12-03 (×4): 40 mg via ORAL
  Filled 2016-11-29 (×4): qty 2

## 2016-11-29 MED ORDER — ONDANSETRON HCL 4 MG PO TABS: 4.0000 mg | ORAL_TABLET | Freq: Four times a day (QID) | ORAL | Status: DC | PRN

## 2016-11-29 MED ORDER — ACETAMINOPHEN 325 MG PO TABS: 650.0000 mg | ORAL_TABLET | Freq: Four times a day (QID) | ORAL | Status: DC | PRN

## 2016-11-29 MED ORDER — TAMSULOSIN HCL 0.4 MG PO CAPS
0.4000 mg | ORAL_CAPSULE | Freq: Every day | ORAL | Status: DC
  Administered 2016-11-30 – 2016-12-03 (×4): 0.4 mg via ORAL
  Filled 2016-11-29 (×4): qty 1

## 2016-11-29 MED ORDER — TRAZODONE HCL 100 MG PO TABS: 100.0000 mg | ORAL_TABLET | Freq: Every evening | ORAL | Status: DC | PRN

## 2016-11-29 MED ORDER — WARFARIN SODIUM 1 MG PO TABS
1.5000 mg | ORAL_TABLET | Freq: Every day | ORAL | Status: DC
  Administered 2016-11-29 – 2016-12-02 (×4): 1.5 mg via ORAL
  Filled 2016-11-29 (×4): qty 2

## 2016-11-29 MED ORDER — INSULIN ASPART 100 UNIT/ML ~~LOC~~ SOLN
0.0000 [IU] | Freq: Every day | SUBCUTANEOUS | Status: DC
  Administered 2016-11-30: 2 [IU] via SUBCUTANEOUS
  Administered 2016-12-01: 3 [IU] via SUBCUTANEOUS

## 2016-11-29 MED ORDER — PANTOPRAZOLE SODIUM 40 MG PO TBEC
40.0000 mg | DELAYED_RELEASE_TABLET | Freq: Every day | ORAL | Status: DC
  Administered 2016-11-30 – 2016-12-03 (×4): 40 mg via ORAL
  Filled 2016-11-29 (×4): qty 1

## 2016-11-29 MED ORDER — DILTIAZEM HCL 100 MG IV SOLR
5.0000 mg/h | INTRAVENOUS | Status: DC
  Administered 2016-11-29: 5 mg/h via INTRAVENOUS
  Filled 2016-11-29: qty 100

## 2016-11-29 MED ORDER — INSULIN ASPART PROT & ASPART (70-30 MIX) 100 UNIT/ML ~~LOC~~ SUSP
25.0000 [IU] | Freq: Two times a day (BID) | SUBCUTANEOUS | Status: DC
  Administered 2016-11-29 – 2016-11-30 (×2): 25 [IU] via SUBCUTANEOUS
  Filled 2016-11-29: qty 10

## 2016-11-29 MED ORDER — ONDANSETRON HCL 4 MG/2ML IJ SOLN
4.0000 mg | Freq: Four times a day (QID) | INTRAMUSCULAR | Status: DC | PRN
  Administered 2016-11-30: 4 mg via INTRAVENOUS
  Filled 2016-11-29: qty 2

## 2016-11-29 MED ORDER — CARVEDILOL 25 MG PO TABS
25.0000 mg | ORAL_TABLET | Freq: Two times a day (BID) | ORAL | Status: DC
  Administered 2016-11-30 – 2016-12-03 (×7): 25 mg via ORAL
  Filled 2016-11-29 (×7): qty 1

## 2016-11-29 NOTE — ED Provider Notes (Signed)
[facility] DEPT Provider Note   CSN: [id number] Arrival date & time: 11/29/16  1627     History   Chief Complaint Chief Complaint  Patient presents with  . Atrial Fibrillation    HPI [name] [name] is a 66 y.o. male.  The history is provided by the patient and medical records. No language interpreter was used.  Atrial Fibrillation  Associated symptoms include chest pain and shortness of breath. Pertinent negatives include no abdominal pain and no headaches.  Patient is a 65 year old male with history of CAD, CHF, CKD stage 3, htn, hld, type 2 DM, AICD, and atrial fibrillation who presents today for heart palpitations, shortness of breath, and dizziness, onset yesterday at 2300 after getting upset at his dog, constant since, reports feels similar to previous afib. Reports he took his pulse at home, range from 90s-170s. Endorses chest pain yesterday evening, lasted approx 3 hours, 3/10, no radiation, describes as pressure, denies CP at this time. Was seen here approx 2 months ago with similar symptoms, cardioverted at that time and discharged on coumadin, has not felt similar symptoms since. Last TEE 09/29/16. Cardiologist is Dr. Martinique and Dr. Rayann Heman. Reports med compliance with warfarin for the past 3 days. Denies fevers, chills, unexplained weight loss, headaches, vision or gait changes, cough, hemoptysis, abd pain, n/v/d, hematuria, dysuria, extremity pain/swelling, or any additional concerns. No recent travel, no hx of DVT/PE. Also reports hyperglycemia, states he has been med compliant with Novolog, but unable to obtain Levemir.     Past Medical History:  Diagnosis Date  . Anxiety   . CAD (coronary artery disease)    a.  1993 s/p MI - [employer];  b. s/p BMS to LAD '00;  c. PTCA 2nd diagonal 2010;  d. 02/18/12 Cath: moderate nonobs dzs - med rx;  e.  01/2015 Cath: LM nl, LAD 40-55mISR, 70-868m, d1 90p (3.0x16 Synergy DES), D2 50-60, LCX nl, OM1 50p, 802m.5x12 Synergy  DES), RCA nl, EF 30-35%.  . Chronic combined systolic and diastolic CHF (congestive heart failure) (HCCWallula  a. 12/2014 Echo: EF 30-35%, Gr2 DD, mod MR, sev dil LA.  . CKD (chronic kidney disease), stage III   . Depression   . ED (erectile dysfunction)   . GERD (gastroesophageal reflux disease)   . Hyperlipidemia   . Hypertension   . Ischemic cardiomyopathy    a. s/p St. [name] (Atlas) ICD [place];  b. 12/2014 Echo: Ef 30-35%.  . MVP (mitral valve prolapse)    a. [facility]  . PMarland Kitchenrsistent atrial fibrillation (HCCMobile City  a. noted on ICD interrogation '10 - not previously on OACFort JohnsonCHA2DS2VASc = 5.  . Type II diabetes mellitus (HCCBlakely  uncontrolled    Patient Active Problem List   Diagnosis Date Noted  . Hyperglycemia   . Atrial flutter with rapid ventricular response (HCCRiverview0/16/2017  . Dizziness 09/27/2016  . Chest pain   . Shortness of breath   . S/P coronary artery stent placement   . Chronic systolic heart failure (HCCCrab Orchard5/09/2015  . Abnormal nuclear stress test 01/14/2015  . Chronic systolic dysfunction of left ventricle 07/25/2013  . DM (diabetes mellitus), type 2 (HCCPort Gibson4/06/2013  . ICD (implantable cardiac defibrillator) battery depletion 03/18/2013  . Near syncope 09/15/2012  . CAD (coronary artery disease) 02/19/2012  . Unstable angina (HCCWoodhaven3/08/2012  . Automatic implantable cardioverter-defibrillator in situ 08/18/2011  . Ischemic cardiomyopathy 05/05/2011  .  Hypertension   . Hyperlipidemia   . GERD (gastroesophageal reflux disease)   . Non-rheumatic mitral regurgitation   . IHD (ischemic heart disease)   . Anxiety     Past Surgical History:  Procedure Laterality Date  . CARDIAC DEFIBRILLATOR PLACEMENT  2007   implanted in [place], has a 7001 RV lead and a SJM Atlas ICD followed by Dr Caryl Comes  . CARDIOVERSION N/A 09/29/2016   Procedure: CARDIOVERSION;  Surgeon: Lelon Perla, MD;  Location: [facility] ENDOSCOPY;   Service: Cardiovascular;  Laterality: N/A;  . IMPLANTABLE CARDIOVERTER DEFIBRILLATOR (ICD) GENERATOR CHANGE N/A 03/19/2013   SJM Fortify ST DR generator placed by Dr Lovena Le, part of Analyze ST study  . LEFT AND RIGHT HEART CATHETERIZATION WITH CORONARY ANGIOGRAM N/A 01/14/2015   Procedure: LEFT AND RIGHT HEART CATHETERIZATION WITH CORONARY ANGIOGRAM;  Surgeon: Peter M Martinique, MD;  Location: [facility] CATH LAB;  Service: Cardiovascular;  Laterality: N/A;  . LEFT HEART CATHETERIZATION WITH CORONARY ANGIOGRAM N/A 02/17/2012   Procedure: LEFT HEART CATHETERIZATION WITH CORONARY ANGIOGRAM;  Surgeon: Jolaine Artist, MD;  Location: [facility] CATH LAB;  Service: Cardiovascular;  Laterality: N/A;  . MITRAL VALVE ANNULOPLASTY  2004   Archie Endo 02/17/2012  . TEE WITHOUT CARDIOVERSION N/A 09/29/2016   Procedure: TRANSESOPHAGEAL ECHOCARDIOGRAM (TEE);  Surgeon: Lelon Perla, MD;  Location: [facility] ENDOSCOPY;  Service: Cardiovascular;  Laterality: N/A;    OB History    No data available       Home Medications    Prior to Admission medications   Medication Sig Start Date End Date Taking? Authorizing Provider  aspirin EC 81 MG tablet Take 81 mg by mouth daily.    Historical Provider, MD  atorvastatin (LIPITOR) 20 MG tablet Take 10 mg by mouth daily.     Historical Provider, MD  blood glucose meter kit and supplies Dispense based on patient and insurance preference. Use up to four times daily as directed. (FOR ICD-9 250.00, 250.01). 09/30/16   Maryann Mikhail, DO  carvedilol (COREG) 25 MG tablet Take 1 tablet (25 mg total) by mouth 2 (two) times daily with a meal. 01/21/15   Peter M Martinique, MD  FLUoxetine (PROZAC) 20 MG tablet Take 20 mg by mouth daily.    Historical Provider, MD  FLUoxetine (PROZAC) 40 MG capsule Take 40 mg by mouth daily.    Historical Provider, MD  insulin aspart protamine- aspart (NOVOLOG MIX 70/30) (70-30) 100 UNIT/ML injection Inject 0.35 mLs (35 Units total) into the skin 2 (two) times daily with a  meal. Patient not taking: Reported on 11/29/2016 09/30/16   Velta Addison Mikhail, DO  Insulin Pen Needle 31G X 5 MM MISC Use with Insulin 09/30/16   Maryann Mikhail, DO  nitroGLYCERIN (NITROSTAT) 0.4 MG SL tablet Place 1 tablet (0.4 mg total) under the tongue every 5 (five) minutes x 3 doses as needed for chest pain. 01/15/15   Brett Canales, PA-C  omeprazole (PRILOSEC) 40 MG capsule Take 40 mg by mouth daily.    Historical Provider, MD  pantoprazole (PROTONIX) 40 MG tablet Take 1 tablet (40 mg total) by mouth daily. 05/16/15   Peter M Martinique, MD  sitaGLIPtin (JANUVIA) 100 MG tablet Take 100 mg by mouth daily.    Historical Provider, MD  tamsulosin (FLOMAX) 0.4 MG CAPS capsule Take 1 capsule (0.4 mg total) by mouth daily. 09/30/16   Maryann Mikhail, DO  valsartan (DIOVAN) 80 MG tablet Take 1 tablet (80 mg total) by mouth daily. 09/30/16   Cristal Ford, DO  warfarin (COUMADIN) 1 MG tablet Take 1 tablet (1 mg total) by mouth one time only at 6 PM. Patient taking differently: Take 1.5 mg by mouth one time only at 6 PM.  09/30/16   Cristal Ford, DO    Family History Family History  Problem Relation Age of Onset  . Coronary artery disease Mother   . Colon cancer Mother   . Heart attack Mother   . Heart disease Mother   . Heart failure Mother   . Hypertension Mother   . Malignant hyperthermia Mother   . Colon cancer Sister   . Coronary artery disease Brother   . Heart disease Brother     Social History Social History  Substance Use Topics  . Smoking status: Never Smoker  . Smokeless tobacco: Never Used  . Alcohol use 0.0 oz/week     Comment: 01/14/2015 "used to drink alot; stopped completely in 1983"     Allergies   Metformin and related and Lisinopril   Review of Systems Review of Systems  Constitutional: Negative for chills, fever and unexpected weight change.  Respiratory: Positive for shortness of breath. Negative for cough and wheezing.   Cardiovascular: Positive for chest  pain and palpitations. Negative for leg swelling.  Gastrointestinal: Negative for abdominal pain, diarrhea, nausea and vomiting.  Genitourinary: Negative for dysuria and frequency.  Musculoskeletal: Negative for back pain.  Skin: Negative for rash.  Neurological: Positive for dizziness. Negative for syncope and headaches.     Physical Exam Updated Vital Signs Ht 5' 11"  (1.803 m)   Wt 104.3 kg   BMI 32.08 kg/m   Physical Exam  Constitutional: He is oriented to person, place, and time. He appears well-developed. No distress.  HENT:  Head: Normocephalic and atraumatic.  Eyes: Conjunctivae and EOM are normal. Pupils are equal, round, and reactive to light.  Neck: Normal range of motion. Neck supple. No JVD present.  Cardiovascular: Intact distal pulses.  An irregularly irregular rhythm present. Tachycardia present.   Murmur heard. Pulmonary/Chest: Effort normal and breath sounds normal. No tachypnea.  Abdominal: Soft. Bowel sounds are normal.  Musculoskeletal: Normal range of motion.  Neurological: He is alert and oriented to person, place, and time.  Skin: Skin is warm and dry.     ED Treatments / Results  Labs (all labs ordered are listed, but only abnormal results are displayed) Labs Reviewed  CBG MONITORING, ED - Abnormal; Notable for the following:       Result Value   Glucose-Capillary 313 (*)    All other components within normal limits  CBC  COMPREHENSIVE METABOLIC PANEL  PROTIME-INR  URINALYSIS, ROUTINE W REFLEX MICROSCOPIC  I-STAT TROPOININ, ED    EKG  EKG Interpretation  Date/Time:  Monday November 29 2016 16:28:00 EST Ventricular Rate:  124 PR Interval:    QRS Duration: 116 QT Interval:  341 QTC Calculation: 490 R Axis:   -38 Text Interpretation:  Atrial Flutter with variable block vs afib Ventricular premature complex Nonspecific IVCD with LAD Anterior infarct, old Abnormal T, consider ischemia, lateral leads Since last tracing rate faster Confirmed  by MILLER  MD, BRIAN ([id number]) on 11/29/2016 4:41:28 PM       Radiology No results found.  Procedures Procedures (including critical care time)  Medications Ordered in ED Medications - No data to display   Initial Impression / Assessment and Plan / ED Course  I have reviewed the triage vital signs and the nursing notes.  Pertinent labs & imaging results that were  available during my care of the patient were reviewed by me and considered in my medical decision making (see chart for details).  Clinical Course    Pt is a 65 y/o M who presents to ED for heart palpitations, SOB, and dizziness, concern for atrial fibrillation with RVR. EKG with rate 124, aflutter vs afib. Will get labs including cardiac enzymes and CXR. Will plan for rate control via diltiazem and discuss with cardiology once labs have resulted.   6:19 PM Pt updated on results, denies any additional concerns, will continue to monitor.Pending labs results and  cardizem administration.  6:49 PM Pt labs resulted. Trop 0.01, INR 2.54. CXR negative. Will plan to discuss with cardiology.   7:14 PM Discussed with cardiology, plan to admit. Per discussion with cards, will plan to defer cardioversion at this time,.   Final Clinical Impressions(s) / ED Diagnoses   Final diagnoses:  None    New Prescriptions New Prescriptions   No medications on file     Ulice Bold, NP 11/29/16 2259    Noemi Chapel, MD 12/01/16 1007

## 2016-11-29 NOTE — Telephone Encounter (Signed)
[name] from [facility] wants to speak with RN regarding whether they should send patient to ER based on his symptoms.  Call was sent through to [facility] Triage.

## 2016-11-29 NOTE — ED Triage Notes (Signed)
Pt brought in by EMS from [place] office due to having uncontrolled a.fib with rates from 40-160 per EMS. Pt a&ox4. Blood sugar almost in 400's. Pt has been having trouble affording medication.

## 2016-11-29 NOTE — ED Notes (Addendum)
Pt called me into the room with increasing shortness of breath.  Lung sounds clear.  Repositioned in bed for comfort.  Pillows placed behind back.

## 2016-11-29 NOTE — ED Notes (Signed)
Patient is resting comfortably. 

## 2016-11-29 NOTE — Telephone Encounter (Signed)
Spoke with lauren @[facility] she states that her provider would like to send a pt to the hospital she states that pt's HR is irregular@ 54 BP 118/60 pt is having palpitations, dizziness and SOB his blood sugar is 495 this morning his BP was 140/80 HR 77 with diabetes RN today. She will try to get ambulance to go to [place].

## 2016-11-29 NOTE — Progress Notes (Signed)
[facility] for coumadin  Indication: atrial fibrillation  Allergies  Allergen Reactions  . Metformin And Related Nausea Only and Other (See Comments)    Loss of appetite  . Lisinopril Cough    Patient Measurements: Height: 5\' 11"  (180.3 cm) Weight: 229 lb 3.2 oz (104 kg) IBW/kg (Calculated) : 75.3  Vital Signs: Temp: 97.4 F (36.3 C) (12/18 2136) Temp Source: Axillary (12/18 2136) BP: 110/63 (12/18 2136) Pulse Rate: 73 (12/18 2136)  Labs:  Recent Labs  11/29/16 1800  HGB 14.8  HCT 42.4  PLT 231  APTT 45*  LABPROT 27.8*  INR 2.54  CREATININE 1.34*    Estimated Creatinine Clearance (by C-G formula based on SCr of 1.34 mg/dL (H)) Male: 54.1 mL/min Male: 65.7 mL/min  Assessment: 64 yoM on coumadin for atrial fibrillation. INR 2.54 on admission, with last dose taken on 12/17 and prior to arrival dose of 1.5 mg daily per med rec.   Goal of Therapy:  INR 2-3 Monitor platelets by anticoagulation protocol: Yes   Plan:  1. Continue coumadin 1.5 mg daily  2. Daily INR   Vincenza Hews, PharmD, BCPS 11/29/2016, 10:30 PM Pager: [phone]

## 2016-11-29 NOTE — H&P (Signed)
History and Physical    [name] [name] ASN:[id number] DOB: March 26, 1951 DOA: 11/29/2016  Referring MD/NP/PA: Dr. Sabra Heck PCP: [name], PA  Patient coming from: PCP office  Chief Complaint: Started feeling sick over the weekend  HPI: [name] [name] is a 66 y.o. male with medical history significant of HTN, HLD, systolic CHF last EF 19-37% in 09/2016, CAD, MVP, s/p AICD/PM, and gerd; who presents with complaints of 2-3 days of feeling sick. Patient reports symptoms of feeling dizzy/ lightheaded with intermittent chest discomfort and palpitations.   He was intermittently taking his pulse andthe heart rates ranged from 90s to 170s. Yesterday evening he had chest discomfort on the left that felt like pressure lasting 3 hours or so and then self resolving. Associated symptoms included shortness of breath. Denies having any recent changes in weight, leg swelling. cough, fever, chills, dysuria, abdominal pain, or recent travel. He does not always take his medicines as advised that have been taking them regularly over the last few days. Patient reports symptoms similar to her previous episode of atrial fibrillation for which patient notes that he was cardioverted 2 months ago.He is followed by Dr. Martinique of cardiology in the outpatients setting. When his home health nurse came today she recommended him to go to his doctor's office and was sent from his doctor's office to the hospital.  ED Course: Upon admission into the emergency department patient was seen to be in atrial fibrillation with heart rates into the 130s. BNP 279.8, troponin negative, and INR 2.54. He was started on a diltiazem drip and TRH called to admit.  Review of Systems: As per HPI otherwise 10 point review of systems negative.   Past Medical History:  Diagnosis Date  . Anxiety   . CAD (coronary artery disease)    a.  1993 s/p MI - [employer];  b. s/p BMS to LAD '00;  c. PTCA 2nd diagonal 2010;  d. 02/18/12 Cath:  moderate nonobs dzs - med rx;  e.  01/2015 Cath: LM nl, LAD 40-67m ISR, 70-30m/d, d1 90p (3.0x16 Synergy DES), D2 50-60, LCX nl, OM1 50p, 23m (2.5x12 Synergy DES), RCA nl, EF 30-35%.  . Chronic combined systolic and diastolic CHF (congestive heart failure) ([place])    a. 12/2014 Echo: EF 30-35%, Gr2 DD, mod MR, sev dil LA.  . CKD (chronic kidney disease), stage III   . Depression   . ED (erectile dysfunction)   . GERD (gastroesophageal reflux disease)   . Hyperlipidemia   . Hypertension   . Ischemic cardiomyopathy    a. s/p St. [name] (Atlas) ICD implanted in [place] 2007;  b. 12/2014 Echo: Ef 30-35%.  . MVP (mitral valve prolapse)    a. s/p MV annuloplasty at [facility] 2004.  Marland Kitchen Persistent atrial fibrillation ([place])    a. noted on ICD interrogation '10 - not previously on [place] - CHA2DS2VASc = 5.  . Type II diabetes mellitus ([place])    uncontrolled    Past Surgical History:  Procedure Laterality Date  . CARDIAC DEFIBRILLATOR PLACEMENT  2007   implanted in [place], has a 7001 RV lead and a SJM Atlas ICD followed by Dr Caryl Comes  . CARDIOVERSION N/A 09/29/2016   Procedure: CARDIOVERSION;  Surgeon: Lelon Perla, MD;  Location: [facility] ENDOSCOPY;  Service: Cardiovascular;  Laterality: N/A;  . IMPLANTABLE CARDIOVERTER DEFIBRILLATOR (ICD) GENERATOR CHANGE N/A 03/19/2013   SJM Fortify ST DR generator placed by Dr Lovena Le, part of Analyze ST study  . LEFT AND RIGHT HEART  CATHETERIZATION WITH CORONARY ANGIOGRAM N/A 01/14/2015   Procedure: LEFT AND RIGHT HEART CATHETERIZATION WITH CORONARY ANGIOGRAM;  Surgeon: Peter M Martinique, MD;  Location: [facility] CATH LAB;  Service: Cardiovascular;  Laterality: N/A;  . LEFT HEART CATHETERIZATION WITH CORONARY ANGIOGRAM N/A 02/17/2012   Procedure: LEFT HEART CATHETERIZATION WITH CORONARY ANGIOGRAM;  Surgeon: Jolaine Artist, MD;  Location: [facility] CATH LAB;  Service: Cardiovascular;  Laterality: N/A;  . MITRAL VALVE ANNULOPLASTY  2004   Archie Endo 02/17/2012  . TEE WITHOUT CARDIOVERSION N/A  09/29/2016   Procedure: TRANSESOPHAGEAL ECHOCARDIOGRAM (TEE);  Surgeon: Lelon Perla, MD;  Location[facility] ENDOSCOPY;  Service: Cardiovascular;  Laterality: N/A;     reports that he has never smoked. He has never used smokeless tobacco. He reports that he drinks alcohol. He reports that he does not use drugs.  Allergies  Allergen Reactions  . Metformin And Related Nausea Only and Other (See Comments)    Loss of appetite  . Lisinopril Cough    Family History  Problem Relation Age of Onset  . Coronary artery disease Mother   . Colon cancer Mother   . Heart attack Mother   . Heart disease Mother   . Heart failure Mother   . Hypertension Mother   . Malignant hyperthermia Mother   . Colon cancer Sister   . Coronary artery disease Brother   . Heart disease Brother     Prior to Admission medications   Medication Sig Start Date End Date Taking? Authorizing Provider  aspirin EC 81 MG tablet Take 81 mg by mouth daily.   Yes Historical Provider, MD  atorvastatin (LIPITOR) 20 MG tablet Take 20 mg by mouth daily at 6 PM.    Yes Historical Provider, MD  carvedilol (COREG) 25 MG tablet Take 1 tablet (25 mg total) by mouth 2 (two) times daily with a meal. 01/21/15  Yes Peter M Martinique, MD  FLUoxetine (PROZAC) 40 MG capsule Take 40 mg by mouth daily.   Yes Historical Provider, MD  insulin aspart protamine- aspart (NOVOLOG MIX 70/30) (70-30) 100 UNIT/ML injection Inject 35 Units into the skin 2 (two) times daily with a meal.   Yes Historical Provider, MD  nitroGLYCERIN (NITROSTAT) 0.4 MG SL tablet Place 1 tablet (0.4 mg total) under the tongue every 5 (five) minutes x 3 doses as needed for chest pain. 01/15/15  Yes Brett Canales, PA-C  omeprazole (PRILOSEC) 40 MG capsule Take 40 mg by mouth daily.   Yes Historical Provider, MD  sitaGLIPtin (JANUVIA) 100 MG tablet Take 100 mg by mouth daily.   Yes Historical Provider, MD  tamsulosin (FLOMAX) 0.4 MG CAPS capsule Take 1 capsule (0.4 mg total) by mouth  daily. 09/30/16  Yes Maryann Mikhail, DO  traZODone (DESYREL) 100 MG tablet Take 100 mg by mouth at bedtime as needed for sleep.   Yes Historical Provider, MD  valsartan (DIOVAN) 80 MG tablet Take 1 tablet (80 mg total) by mouth daily. 09/30/16  Yes Maryann Mikhail, DO  warfarin (COUMADIN) 1 MG tablet Take 1 tablet (1 mg total) by mouth one time only at 6 PM. Patient taking differently: Take 1.5 mg by mouth daily at 6 PM.  09/30/16  Yes Cristal Ford, DO    Physical Exam:  Constitutional: NAD, calm, comfortable Vitals:   11/29/16 1915 11/29/16 1930 11/29/16 1945 11/29/16 2000  BP: 122/99 114/76 111/83 98/78  Pulse: 78 73 66 75  Resp: 16 12 16 14   SpO2: 97% 97% 98% 99%  Weight:  Height:       Eyes: PERRL, lids and conjunctivae normal ENMT: Mucous membranes are moist. Posterior pharynx clear of any exudate or lesions.Normal dentition.  Neck: normal, supple, no masses, no thyromegaly Respiratory: clear to auscultation bilaterally, no wheezing, no crackles. Normal respiratory effort. No accessory muscle use.  Cardiovascular: Irregular irregular, no murmurs / rubs / gallops. No extremity edema. 2+ pedal pulses. No carotid bruits.  Abdomen: no tenderness, no masses palpated. No hepatosplenomegaly. Bowel sounds positive.  Musculoskeletal: no clubbing / cyanosis. No joint deformity upper and lower extremities. Good ROM, no contractures. Normal muscle tone.  Skin: no rashes, lesions, ulcers. No induration Neurologic: CN 2-12 grossly intact. Sensation intact, DTR normal. Strength 5/5 in all 4.  Psychiatric: Normal judgment and insight. Alert and oriented x 3. Normal mood.     Labs on Admission: I have personally reviewed following labs and imaging studies  CBC:  Recent Labs Lab 11/29/16 1800  WBC 8.3  HGB 14.8  HCT 42.4  MCV 85.8  PLT 694   Basic Metabolic Panel:  Recent Labs Lab 11/29/16 1800  NA 136  K 4.1  CL 104  CO2 23  GLUCOSE 273*  BUN 12  CREATININE 1.34*    CALCIUM 9.2   GFR: Estimated Creatinine Clearance (by C-G formula based on SCr of 1.34 mg/dL (H)) Male: 54.2 mL/min Male: 65.8 mL/min Liver Function Tests:  Recent Labs Lab 11/29/16 1800  AST 22  ALT 17  ALKPHOS 69  BILITOT 0.8  PROT 6.3*  ALBUMIN 3.7   No results for input(s): LIPASE, AMYLASE in the last 168 hours. No results for input(s): AMMONIA in the last 168 hours. Coagulation Profile:  Recent Labs Lab 11/29/16 1800  INR 2.54   Cardiac Enzymes: No results for input(s): CKTOTAL, CKMB, CKMBINDEX, TROPONINI in the last 168 hours. BNP (last 3 results) No results for input(s): PROBNP in the last 8760 hours. HbA1C: No results for input(s): HGBA1C in the last 72 hours. CBG:  Recent Labs Lab 11/29/16 1647  GLUCAP 313*   Lipid Profile: No results for input(s): CHOL, HDL, LDLCALC, TRIG, CHOLHDL, LDLDIRECT in the last 72 hours. Thyroid Function Tests: No results for input(s): TSH, T4TOTAL, FREET4, T3FREE, THYROIDAB in the last 72 hours. Anemia Panel: No results for input(s): VITAMINB12, FOLATE, FERRITIN, TIBC, IRON, RETICCTPCT in the last 72 hours. Urine analysis:    Component Value Date/Time   COLORURINE YELLOW 11/29/2016 1810   APPEARANCEUR CLEAR 11/29/2016 1810   LABSPEC 1.030 11/29/2016 1810   PHURINE 5.0 11/29/2016 1810   GLUCOSEU >=500 (A) 11/29/2016 1810   HGBUR NEGATIVE 11/29/2016 1810   BILIRUBINUR NEGATIVE 11/29/2016 1810   KETONESUR NEGATIVE 11/29/2016 1810   PROTEINUR NEGATIVE 11/29/2016 1810   NITRITE NEGATIVE 11/29/2016 1810   LEUKOCYTESUR NEGATIVE 11/29/2016 1810   Sepsis Labs: No results found for this or any previous visit (from the past 240 hour(s)).   Radiological Exams on Admission: Dg Chest Port 1 View  Result Date: 11/29/2016 CLINICAL DATA:  weakness today. Pt is SOB when walking. Nonsmoker. Hx of diabetes, hypertension, afib, CHF, ischemic cardiomyopathy, mitral valve prolapse. Heart catheterization (2013, 2016). Defibrillator  placement(2007), change in 2014.Pt had open heart surgery for valve repair in 2004. EXAM: PORTABLE CHEST - 1 VIEW COMPARISON:  09/27/2016 FINDINGS: Lungs are clear. Heart size normal. Previous median sternotomy. Left subclavian AICD stable. No effusion. IMPRESSION: No acute disease.  Stable postop changes. Electronically Signed   By: Lucrezia Europe M.D.   On: 11/29/2016 17:52  EKG: Independently reviewed. Atrial fibrillation  Assessment/Plan Atrial fibrillation: Patient CHADsVASc score = 4. Currently with a therapeutic INR of 2.54 on admission. Patient previously cardioverted. - Admit to stepdown - Continue diltiazem drip - Coumadin per pharmacy  - Cardiology will need to see in a.m.  Acute on chronic systolic CHF exacerbation: Patient's BNP was elevated similar to previous admission. His x-ray showing cardiomegaly with congestion. Last echocardiogram showing EF of 20-25% by TEEin 09/2016. - Lasix 40 mg IV 1 dose now;  we'll need to reassess in continued IV diuresis as needed -Strict I&O's - Albuterol nebs as needed for shortness of breath/wheezing  Diabetes mellitus type 2:  - Hypoglycemic protocols - Reduced home NovoLog 70/30 from 35 to 25 units subcutaneous twice a day as patient has been more controlled environment - CBGs every before meals and at bedtime with sensitive sliding scale insulin - Hold Januvia  CAD - Continue aspirin  AICD in place: last interrogated one month ago.  Essential hypertension - Pharmacy substitution of Diovan for irbesartan - Continue Coreg as tolerated  Hyperlipidemia - Continue atorvastatin  Anxiety  - Continue Prozac   GERD - Pharmacy substitution of omeprazole for Protonix   DVT prophylaxis:  Coumadin  Code Status: Full code   Family Communication: No family present at bedside  Disposition Plan: Likely discharge home once medically stable Consults called: Cardiology admission status: Observation  Norval Morton MD [facility] Pager [phone]  If 7PM-7AM, please contact night-coverage [email or web address] Password [facility]  11/29/2016, 8:58 PM

## 2016-11-29 NOTE — Patient Outreach (Signed)
[facility]) Care Management   11/29/2016  [place] 29-Jul-1951 [id number]  [name] [name] is an 65 y.o. male Arrived for scheduled home visit. 2 dogs in the home. Wife present for home visit Subjective: Patient reports that he is struggling with medications and DM.  States that he thinks he went back into afib yesterday with a episode of dizziness and high heart rate. States that he has had on and off dizziness for a long time. But it was worse yesterday. Patient reports that he did not go to the hospital yesterday because he can not afford it. States that he struggles with 45.00 co pays to specialist and does not know when he will be able to go back to cardiologist.  Reports overdrafted bank accounts and being unable to afford medications. Reports they are on waiting list for cheaper housing for the last 2 years. Patient reports that wife has cancer. Patient reports that he has some issues with his memory.  States that he forgot 2 days of medications last week. Wife usually reminds patient but she was since last week.   Objective:  Awake and alert.  Jumps up quickly to let out dogs or go to refrigerator. Irregular heart beat noted during exam. Vitals:   11/29/16 1026  BP: 140/80  Pulse: 77  SpO2: 99%  Weight: 230 lb (104.3 kg)  Height: 1.803 m (_0 )   Review of Systems  Constitutional:       Reports onset of dizziness worsened yesterday. States that his heart rate went up to 140.  Reports heart rate up and down since.  Reports that his heart rate was normal until yesterday.  HENT: Negative.   Eyes: Negative.   Respiratory: Positive for cough and shortness of breath.   Cardiovascular: Positive for palpitations.       Reports that he thinks he went back into Afib yesterday  Gastrointestinal: Positive for nausea.  Genitourinary: Positive for frequency.  Musculoskeletal: Positive for falls and joint pain.       Reports hips, knees, shoulders.  Skin:  Negative.   Neurological: Positive for dizziness and weakness.       Reports sometimes he does not think clearly  Endo/Heme/Allergies: Bruises/bleeds easily.  Psychiatric/Behavioral: Positive for depression.    Physical Exam  Constitutional: He is oriented to person, place, and time. He appears well-developed and well-nourished.  Cardiovascular: Intact distal pulses.   Irregular rhythm  Respiratory: Effort normal and breath sounds normal.  Lungs clear  GI: Soft. Bowel sounds are normal.  Musculoskeletal: Normal range of motion. He exhibits no edema.  Neurological: He is alert and oriented to person, place, and time.  2017, trump, Monday,  December   Skin: Skin is warm and dry.  Both feet without lesions or open skin.  Scabbed area noted to the right hand.   Psychiatric: He has a normal mood and affect. His behavior is normal. Judgment and thought content normal.    Encounter Medications:   Outpatient Encounter Prescriptions as of 11/29/2016  Medication Sig Note  . aspirin EC 81 MG tablet Take 81 mg by mouth daily.   Marland Kitchen atorvastatin (LIPITOR) 20 MG tablet Take 10 mg by mouth daily.    . blood glucose meter kit and supplies Dispense based on patient and insurance preference. Use up to four times daily as directed. (FOR ICD-9 250.00, 250.01).   . carvedilol (COREG) 25 MG tablet Take 1 tablet (25 mg total) by mouth 2 (two) times daily  with a meal.   . FLUoxetine (PROZAC) 40 MG capsule Take 40 mg by mouth daily.   . Insulin Pen Needle 31G X 5 MM MISC Use with Insulin   . nitroGLYCERIN (NITROSTAT) 0.4 MG SL tablet Place 1 tablet (0.4 mg total) under the tongue every 5 (five) minutes x 3 doses as needed for chest pain.   Marland Kitchen omeprazole (PRILOSEC) 40 MG capsule Take 40 mg by mouth daily.   . pantoprazole (PROTONIX) 40 MG tablet Take 1 tablet (40 mg total) by mouth daily.   . sitaGLIPtin (JANUVIA) 100 MG tablet Take 100 mg by mouth daily.   . tamsulosin (FLOMAX) 0.4 MG CAPS capsule Take 1  capsule (0.4 mg total) by mouth daily.   . valsartan (DIOVAN) 80 MG tablet Take 1 tablet (80 mg total) by mouth daily.   Marland Kitchen warfarin (COUMADIN) 1 MG tablet Take 1 tablet (1 mg total) by mouth one time only at 6 PM. (Patient taking differently: Take 1.5 mg by mouth one time only at 6 PM. )   . FLUoxetine (PROZAC) 20 MG tablet Take 20 mg by mouth daily.   . insulin aspart protamine- aspart (NOVOLOG MIX 70/30) (70-30) 100 UNIT/ML injection Inject 0.35 mLs (35 Units total) into the skin 2 (two) times daily with a meal. (Patient not taking: Reported on 11/29/2016) 11/29/2016: Has not been taken due to cost.  Then when samples provided patient misunderstood and took novolog fast acting.    No facility-administered encounter medications on file as of 11/29/2016.     Functional Status:   In your present state of health, do you have any difficulty performing the following activities: 11/29/2016 10/07/2016  Hearing? Tempie Donning  Vision? Y N  Difficulty concentrating or making decisions? Tempie Donning  Walking or climbing stairs? Y N  Dressing or bathing? N N  Doing errands, shopping? Tempie Donning  Preparing Food and eating ? N N  Using the Toilet? N N  In the past six months, have you accidently leaked urine? Y Y  Do you have problems with loss of bowel control? N Y  Managing your Medications? Y N  Managing your Finances? Y N  Housekeeping or managing your Housekeeping? Y Y  Some recent data might be hidden    Fall/Depression Screening:    PHQ 2/9 Scores 11/29/2016 10/07/2016  PHQ - 2 Score 5 0  PHQ- 9 Score 17 -   Fall Risk  11/29/2016 10/07/2016  Falls in the past year? Yes Yes  Number falls in past yr: 2 or more 1  Injury with Fall? Yes No  Risk Factor Category  High Fall Risk -  Risk for fall due to : History of fall(s) -  Follow up Falls prevention discussed -   Assessment:   (1) reviewed [facility] new patient packet and provided contact information.   (2) not taking medications as prescribed. (3) positive  depression screening (4) financial struggles.  (5) no advance directives (6) CBG 469 fasting.  Rechecked after 30 minutes 495.  Sharing CBG meter with wife.  Not following DM diet.  (7) irregular heart beat and dizziness.  Plan:  (1) consent on file. Declines needed changes to consent.  Encouraged patient to call nurse line or this case manager as needed for non emergency issues. Reviewed importance of calling MD for changes in condition or worsening symptoms.  (2) reviewed with patient how to take medications. Reviewed importance of taking all medications as prescribed. Pharmacy home visit tomorrow. In basket message  sent to [place].  Placed call to MD office and informed of medications concerns. (3) reported to MD office and [facility] social worker. (4)referral made to [facility] social worker and inbasket message of this home visit sent as well. (5) provided patient with Advanced Directive packet. (6) Reviewed with patient the importance of taking medications as prescribed. Reported these finding to MD office [name].  Patient with office visit planned at 2:15pm today. Encouraged MD office to send order for CBG meter and supplies to [facility] mail order pharmacy.  (7) Notified primary MD office prior to office visit today. Will also send this not.    Care planning and goal setting during this home visit. Primary goal is to avoid a readmission and improve DM control.   [facility] CM Care Plan Problem One   Flowsheet Row Most Recent Value  Care Plan Problem One  Recent hospitalization for paroxysmal a-flutter/a-fib, heart failure, hyperglycemia   Role Documenting the Problem One  Care Management [place] for Problem One  Active  [facility] Long Term Goal (31-90 days)  Patient will not have a hospitalization within the next 90 days  [facility] Long Term Goal Start Date  10/01/16  Interventions for Problem One Long Term Goal  Home visit completed today. Md office notified of concerns Reviewed with patient how  to take medications and reasons to call MD.  [facility] CM Short Term Goal #1 (0-30 days)  Patient will attend all scheduled appointments within the next 30 days  [facility] CM Short Term Goal #1 Start Date  10/01/16  Interventions for Short Term Goal #1  Encouraged patient to attend MD visit today. States he will. Wife to drive.  [facility] CM Short Term Goal #2 (0-30 days)  Patient will adhere to taking all prescribed medications within the next 30 days  [facility] CM Short Term Goal #2 Start Date  10/01/16  Interventions for Short Term Goal #2  Home visit completed. reviewed correct medications and doses.  Phamacy referral completed. Called MD office to inform of struggle to obtain medications.   [facility] CM Short Term Goal #3 (0-30 days)  Patient will reports monitoring and recording CBG daily in [facility] calendar for 30 days.   [facility] CM Short Term Goal #3 Start Date  11/22/16  Interventions for Short Tern Goal #3  Reviewed need for new meter for patient only. Requested MD send in order. Provided Emmi educations about DM and DM tear off low carb handout. requested [facility] pharmacist to take patient [facility] DM packets. Reviewed where to record CBG readings in calendar and importance of call MD for abnormal readings     Next home visit planned for January 16th. Primary MD visit today., [place] visit 11/30/2016 Pending [facility] social worker contact.  Tomasa Rand, RN, BSN, CEN [facility] [employer] [phone]

## 2016-11-29 NOTE — ED Provider Notes (Signed)
Ejection fraction by transesophageal echocardiogram performed within the last couple of weeks showed that the patient had the appearance of global hypokinesis and an ejection fraction of 20-25% as well as moderate mitral valve regurgitation. His prior echocardiogram performed 11 months prior showed 30-35% ejection fraction.  He reports that he underwent cardioversion while he was in the hospital and was discharged on Coumadin. He now reports that yesterday he developed recurrent shortness of breath with a tightness and heaviness in his chest with a feeling of palpitations and weakness. On presenting to his family doctor's office today he was in atrial fibrillation or atrial flutter with a variable but tachycardic response, he was directed to the emergency department after discussion with cardiologist to request that he be seen in the emergency department. My exam the patient is tachycardic with an irregularly irregular rhythm, he has no peripheral edema, no JVD, soft abdomen, soft heart murmur, clear lung sounds, does not appear to be in distress other than his unstable cardiac rhythm. He has a blood pressure which is tolerating this, his blood sugar was close to 500 pre-hospital but is 300 here. He reports that he has been more compliant with his medical regimen over the last 3 days but had been without his medications for some time prior to that. Labs, chest x-ray, EKG, the EKG does show that he has some abnormal ST segments with some T waves in the inferior leads as well as ST depressions in the lateral leads 1 and aVL. He has diffuse poor wave progression or Q waves throughout the septal leads. I'm concerned that the patient has worsening cardiac function. We'll need to discuss with cardiology after labs are back prior to any attempt at cardioversion or further management of his atrial fibrillation or atrial flutter.  The patient is on a constant Cardizem drip, I discussed his care with the cardiologist Dr.  Radford Pax who requested the patient not be cardioverted due to poor ejection fraction and the unpredictability of his medication compliance with his Coumadin. He is appropriately anticoagulated at this time however there is some concern that he may be harboring small amounts of clot due to noncompliance. I also discussed his care with the hospitalist, Dr. Tamala Julian who will admit. Critical care provided for this patient in atrial fibrillation with a rapid ventricular rate requiring a constant drip. He will go to stepdown unit  CRITICAL CARE Performed by: Johnna Acosta Total critical care time: 35 minutes Critical care time was exclusive of separately billable procedures and treating other patients. Critical care was necessary to treat or prevent imminent or life-threatening deterioration. Critical care was time spent personally by me on the following activities: development of treatment plan with patient and/or surrogate as well as nursing, discussions with consultants, evaluation of patient's response to treatment, examination of patient, obtaining history from patient or surrogate, ordering and performing treatments and interventions, ordering and review of laboratory studies, ordering and review of radiographic studies, pulse oximetry and re-evaluation of patient's condition.    EKG Interpretation  Date/Time:  Monday November 29 2016 16:28:00 EST Ventricular Rate:  124 PR Interval:    QRS Duration: 116 QT Interval:  341 QTC Calculation: 490 R Axis:   -38 Text Interpretation:  Atrial Flutter with variable block vs afib Ventricular premature complex Nonspecific IVCD with LAD Anterior infarct, old Abnormal T, consider ischemia, lateral leads Since last tracing rate faster Confirmed by Avyan Livesay  MD, Taegen Delker ([id number]) on 11/29/2016 4:41:28 PM  Medical screening examination/treatment/procedure(s) were conducted as a shared visit with non-physician practitioner(s) and myself.  I personally evaluated  the patient during the encounter.  Clinical Impression:   Final diagnoses:  Atrial fibrillation with RVR ([place])         Noemi Chapel, MD 12/01/16 1004

## 2016-11-30 ENCOUNTER — Other Ambulatory Visit: Payer: Self-pay | Admitting: Pharmacist

## 2016-11-30 ENCOUNTER — Other Ambulatory Visit: Payer: Self-pay | Admitting: *Deleted

## 2016-11-30 ENCOUNTER — Encounter (HOSPITAL_COMMUNITY): Payer: Self-pay

## 2016-11-30 ENCOUNTER — Ambulatory Visit: Payer: Self-pay | Admitting: Pharmacist

## 2016-11-30 DIAGNOSIS — I4892 Unspecified atrial flutter: Secondary | ICD-10-CM | POA: Diagnosis not present

## 2016-11-30 DIAGNOSIS — I480 Paroxysmal atrial fibrillation: Principal | ICD-10-CM

## 2016-11-30 DIAGNOSIS — I130 Hypertensive heart and chronic kidney disease with heart failure and stage 1 through stage 4 chronic kidney disease, or unspecified chronic kidney disease: Secondary | ICD-10-CM | POA: Diagnosis present

## 2016-11-30 DIAGNOSIS — I5043 Acute on chronic combined systolic (congestive) and diastolic (congestive) heart failure: Secondary | ICD-10-CM | POA: Diagnosis present

## 2016-11-30 DIAGNOSIS — I4891 Unspecified atrial fibrillation: Secondary | ICD-10-CM | POA: Diagnosis present

## 2016-11-30 DIAGNOSIS — I25119 Atherosclerotic heart disease of native coronary artery with unspecified angina pectoris: Secondary | ICD-10-CM | POA: Diagnosis present

## 2016-11-30 DIAGNOSIS — I5022 Chronic systolic (congestive) heart failure: Secondary | ICD-10-CM

## 2016-11-30 DIAGNOSIS — Z95810 Presence of automatic (implantable) cardiac defibrillator: Secondary | ICD-10-CM | POA: Diagnosis not present

## 2016-11-30 DIAGNOSIS — I2510 Atherosclerotic heart disease of native coronary artery without angina pectoris: Secondary | ICD-10-CM

## 2016-11-30 DIAGNOSIS — I255 Ischemic cardiomyopathy: Secondary | ICD-10-CM | POA: Diagnosis present

## 2016-11-30 DIAGNOSIS — Z79899 Other long term (current) drug therapy: Secondary | ICD-10-CM | POA: Diagnosis not present

## 2016-11-30 DIAGNOSIS — F419 Anxiety disorder, unspecified: Secondary | ICD-10-CM | POA: Diagnosis present

## 2016-11-30 DIAGNOSIS — I252 Old myocardial infarction: Secondary | ICD-10-CM | POA: Diagnosis not present

## 2016-11-30 DIAGNOSIS — I340 Nonrheumatic mitral (valve) insufficiency: Secondary | ICD-10-CM | POA: Diagnosis present

## 2016-11-30 DIAGNOSIS — N183 Chronic kidney disease, stage 3 (moderate): Secondary | ICD-10-CM | POA: Diagnosis present

## 2016-11-30 DIAGNOSIS — Z955 Presence of coronary angioplasty implant and graft: Secondary | ICD-10-CM | POA: Diagnosis not present

## 2016-11-30 DIAGNOSIS — E1165 Type 2 diabetes mellitus with hyperglycemia: Secondary | ICD-10-CM | POA: Diagnosis present

## 2016-11-30 DIAGNOSIS — N5082 Scrotal pain: Secondary | ICD-10-CM | POA: Diagnosis present

## 2016-11-30 DIAGNOSIS — Z794 Long term (current) use of insulin: Secondary | ICD-10-CM | POA: Diagnosis not present

## 2016-11-30 DIAGNOSIS — F329 Major depressive disorder, single episode, unspecified: Secondary | ICD-10-CM | POA: Diagnosis present

## 2016-11-30 DIAGNOSIS — Z7901 Long term (current) use of anticoagulants: Secondary | ICD-10-CM | POA: Diagnosis not present

## 2016-11-30 DIAGNOSIS — Z888 Allergy status to other drugs, medicaments and biological substances status: Secondary | ICD-10-CM | POA: Diagnosis not present

## 2016-11-30 DIAGNOSIS — E785 Hyperlipidemia, unspecified: Secondary | ICD-10-CM | POA: Diagnosis present

## 2016-11-30 DIAGNOSIS — K219 Gastro-esophageal reflux disease without esophagitis: Secondary | ICD-10-CM | POA: Diagnosis present

## 2016-11-30 DIAGNOSIS — Z7982 Long term (current) use of aspirin: Secondary | ICD-10-CM | POA: Diagnosis not present

## 2016-11-30 DIAGNOSIS — I959 Hypotension, unspecified: Secondary | ICD-10-CM | POA: Diagnosis present

## 2016-11-30 DIAGNOSIS — I10 Essential (primary) hypertension: Secondary | ICD-10-CM

## 2016-11-30 DIAGNOSIS — Z8249 Family history of ischemic heart disease and other diseases of the circulatory system: Secondary | ICD-10-CM | POA: Diagnosis not present

## 2016-11-30 DIAGNOSIS — E1122 Type 2 diabetes mellitus with diabetic chronic kidney disease: Secondary | ICD-10-CM | POA: Diagnosis present

## 2016-11-30 LAB — BASIC METABOLIC PANEL
Anion gap: 9 (ref 5–15)
BUN: 12 mg/dL (ref 6–20)
CO2: 22 mmol/L (ref 22–32)
Calcium: 8.8 mg/dL — ABNORMAL LOW (ref 8.9–10.3)
Chloride: 105 mmol/L (ref 101–111)
Creatinine, Ser: 1.27 mg/dL — ABNORMAL HIGH (ref 0.61–1.24)
GFR, EST NON AFRICAN AMERICAN: 57 mL/min — AB (ref >60)
Glucose, Bld: 142 mg/dL — ABNORMAL HIGH (ref 65–99)
Potassium: 3.3 mmol/L — ABNORMAL LOW (ref 3.5–5.1)
SODIUM: 136 mmol/L (ref 135–145)

## 2016-11-30 LAB — CBC
HCT: 38.7 % — ABNORMAL LOW (ref 39.0–52.0)
Hemoglobin: 13.6 g/dL (ref 13.0–17.0)
MCH: 29.6 pg (ref 26.0–34.0)
MCHC: 35.1 g/dL (ref 30.0–36.0)
MCV: 84.3 fL (ref 78.0–100.0)
PLATELETS: 211 10*3/uL (ref 150–400)
RBC: 4.59 MIL/uL (ref 4.22–5.81)
RDW: 12.8 % (ref 11.5–15.5)
WBC: 6.8 10*3/uL (ref 4.0–10.5)

## 2016-11-30 LAB — GLUCOSE, CAPILLARY
GLUCOSE-CAPILLARY: 278 mg/dL — AB (ref 65–99)
GLUCOSE-CAPILLARY: 217 mg/dL — AB (ref 65–99)
Glucose-Capillary: 133 mg/dL — ABNORMAL HIGH (ref 65–99)
Glucose-Capillary: 168 mg/dL — ABNORMAL HIGH (ref 65–99)
Glucose-Capillary: 173 mg/dL — ABNORMAL HIGH (ref 65–99)

## 2016-11-30 LAB — PROTIME-INR
INR: 2.39
Prothrombin Time: 26.5 seconds — ABNORMAL HIGH (ref 11.4–15.2)

## 2016-11-30 LAB — TROPONIN I: TROPONIN I: 0.03 ng/mL — AB (ref <0.03)

## 2016-11-30 MED ORDER — AMIODARONE HCL IN DEXTROSE 360-4.14 MG/200ML-% IV SOLN
60.0000 mg/h | INTRAVENOUS | Status: AC
  Administered 2016-11-30: 60 mg/h via INTRAVENOUS
  Filled 2016-11-30: qty 200

## 2016-11-30 MED ORDER — FUROSEMIDE 10 MG/ML IJ SOLN
40.0000 mg | Freq: Once | INTRAMUSCULAR | Status: AC
  Administered 2016-11-30: 40 mg via INTRAVENOUS
  Filled 2016-11-30: qty 4

## 2016-11-30 MED ORDER — AMIODARONE HCL IN DEXTROSE 360-4.14 MG/200ML-% IV SOLN
30.0000 mg/h | INTRAVENOUS | Status: DC
  Administered 2016-12-01 – 2016-12-02 (×3): 30 mg/h via INTRAVENOUS
  Filled 2016-11-30 (×4): qty 200

## 2016-11-30 MED ORDER — AMIODARONE LOAD VIA INFUSION
150.0000 mg | Freq: Once | INTRAVENOUS | Status: AC
  Administered 2016-11-30: 150 mg via INTRAVENOUS
  Filled 2016-11-30: qty 83.34

## 2016-11-30 MED ORDER — POTASSIUM CHLORIDE CRYS ER 20 MEQ PO TBCR
40.0000 meq | EXTENDED_RELEASE_TABLET | ORAL | Status: AC
  Administered 2016-11-30: 40 meq via ORAL
  Filled 2016-11-30: qty 2

## 2016-11-30 NOTE — Plan of Care (Signed)
Problem: Safety: Goal: Ability to remain free from injury will improve Outcome: Completed/Met Date Met: 11/30/16 Reviewed with patient the importance of using call light or phone for assistance and not to get OOB by himself. Patient verbalized understanding of how to use the white board re: names and numbers of RN/NT and important information that needs to be passed on

## 2016-11-30 NOTE — Progress Notes (Signed)
Cardizem drip shut off and MD aware, will continue to monitor for now, placed patient on his back and redid his B/P which now is in the 100/60-70 range, will continue to monitor, no other changes at this time.

## 2016-11-30 NOTE — Patient Outreach (Signed)
[facility]) Care Management  12/18.2017   [place] 1951-05-18 [id number]   CSW made an initial attempt to try and contact patient today to perform phone assessment, as well as assess and assist with social needs and services, without success.  A HIPAA complant message was left for patient on voicemail.  Will try again if no return call.   Eduard Clos, MSW, [place] Worker  [place] [phone]

## 2016-11-30 NOTE — Patient Outreach (Signed)
[facility]) Care Management  11/30/2016  [name] [name] 1951/04/16 [id number]  65 year old male referred to [facility] for medication assistance.  Patient is currently hospitalized at [facility] and today I visited patient at the hospital and then called patients wife with permission to discuss medication assistance.  Patient states they are on a fixed social security income and most of his medications are free through [employer] order except his insulin and Januvia which have a $47 copay. Patient owes [employer] [phone] and they will not send any of his medications until he pays this.  Patients wife has cancer and he states his income situation is variable based on what his wife needs for her healthcare expenses and he cannot commit that he would be able to budget $47 per month to pay for his insulin in the upcoming months.  Patient is awaiting a [facility] social worker consult to assist with housing/other living expenses.  He states that he currently has a 2-4 days of insulin remaining at home.  [facility] pharmacist Deanne Coffer, PharmD assisted patient with Januvia, omeprazole, and aspirin last weeks 12/12 but [facility] filled Novolog instead of Novolog 70/30.    Plan: -Per [facility] Mail order pharmacy patient does not owe a balance at this time. [employer] has not shipped anything to Mr [name] [name] 08/2016.  -Completed low income subsidy/extra help application with patients wife over the phone.  Patient/wife should receive results of application via mail in 2-3 weeks -[employer] will pay for patients Novolog 70/30 as part of [facility] pharmacy emergency fund.    -Will followup via telephone tomorrow for patient assistance   Bennye Alm, PharmD [facility] PGY2 Pharmacy Resident [phone]

## 2016-11-30 NOTE — Patient Outreach (Signed)
[facility]) Care Management  11/30/2016  [name] [name] [birth year] [id number]   11/30/2016  [place] May 11, 1951 [id number]   CSW made initial contact with patient today by phone. CSW introduced self and role. Patient agreeable to CSW support and visit; "I don't read very well". CSW offered home visit and patient agrees to this; plan in home visit on 12/09/16.   [name] [name], MSW, [place] Worker  [place] [phone]

## 2016-11-30 NOTE — Discharge Instructions (Addendum)
Information on my medicine - Coumadin®   (Warfarin) ° °This medication education was reviewed with me or my healthcare representative as part of my discharge preparation.  The pharmacist that spoke with me during my hospital stay was:  Egan, Theresa Donovan, RPH ° °Why was Coumadin prescribed for you? °Coumadin was prescribed for you because you have a blood clot or a medical condition that can cause an increased risk of forming blood clots. Blood clots can cause serious health problems by blocking the flow of blood to the heart, lung, or brain. Coumadin can prevent harmful blood clots from forming. °As a reminder your indication for Coumadin is:   Stroke Prevention Because Of Atrial Fibrillation ° °What test will check on my response to Coumadin? °While on Coumadin (warfarin) you will need to have an INR test regularly to ensure that your dose is keeping you in the desired range. The INR (international normalized ratio) number is calculated from the result of the laboratory test called prothrombin time (PT). ° °If an INR APPOINTMENT HAS NOT ALREADY BEEN MADE FOR YOU please schedule an appointment to have this lab work done by your health care provider within 7 days. °Your INR goal is usually a number between:  2 to 3 or your provider may give you a more narrow range like 2-2.5.  Ask your health care provider during an office visit what your goal INR is. ° °What  do you need to  know  About  COUMADIN? °Take Coumadin (warfarin) exactly as prescribed by your healthcare provider about the same time each day.  DO NOT stop taking without talking to the doctor who prescribed the medication.  Stopping without other blood clot prevention medication to take the place of Coumadin may increase your risk of developing a new clot or stroke.  Get refills before you run out. ° °What do you do if you miss a dose? °If you miss a dose, take it as soon as you remember on the same day then continue your regularly scheduled regimen the  next day.  Do not take two doses of Coumadin at the same time. ° °Important Safety Information °A possible side effect of Coumadin (Warfarin) is an increased risk of bleeding. You should call your healthcare provider right away if you experience any of the following: °? Bleeding from an injury or your nose that does not stop. °? Unusual colored urine (red or dark brown) or unusual colored stools (red or black). °? Unusual bruising for unknown reasons. °? A serious fall or if you hit your head (even if there is no bleeding). ° °Some foods or medicines interact with Coumadin® (warfarin) and might alter your response to warfarin. To help avoid this: °? Eat a balanced diet, maintaining a consistent amount of Vitamin K. °? Notify your provider about major diet changes you plan to make. °? Avoid alcohol or limit your intake to 1 drink for women and 2 drinks for men per day. °(1 drink is 5 oz. wine, 12 oz. beer, or 1.5 oz. liquor.) ° °Make sure that ANY health care provider who prescribes medication for you knows that you are taking Coumadin (warfarin).  Also make sure the healthcare provider who is monitoring your Coumadin knows when you have started a new medication including herbals and non-prescription products. ° °Coumadin® (Warfarin)  Major Drug Interactions  °Increased Warfarin Effect Decreased Warfarin Effect  °Alcohol (large quantities) °Antibiotics (esp. Septra/Bactrim, Flagyl, Cipro) °Amiodarone (Cordarone) °Aspirin (ASA) °Cimetidine (Tagamet) °Megestrol (Megace) °NSAIDs (ibuprofen,   naproxen, etc.) Piroxicam (Feldene) Propafenone (Rythmol SR) Propranolol (Inderal) Isoniazid (INH) Posaconazole (Noxafil) Barbiturates (Phenobarbital) Carbamazepine (Tegretol) Chlordiazepoxide (Librium) Cholestyramine (Questran) Griseofulvin Oral Contraceptives Rifampin Sucralfate (Carafate) Vitamin K   Coumadin (Warfarin) Major Herbal Interactions  Increased Warfarin Effect Decreased Warfarin Effect   Garlic Ginseng Ginkgo biloba Coenzyme Q10 Green tea St. Johns wort    Coumadin (Warfarin) FOOD Interactions  Eat a consistent number of servings per week of foods HIGH in Vitamin K (1 serving =  cup)  Collards (cooked, or boiled & drained) Kale (cooked, or boiled & drained) Mustard greens (cooked, or boiled & drained) Parsley *serving size only =  cup Spinach (cooked, or boiled & drained) Swiss chard (cooked, or boiled & drained) Turnip greens (cooked, or boiled & drained)  Eat a consistent number of servings per week of foods MEDIUM-HIGH in Vitamin K (1 serving = 1 cup)  Asparagus (cooked, or boiled & drained) Broccoli (cooked, boiled & drained, or raw & chopped) Brussel sprouts (cooked, or boiled & drained) *serving size only =  cup Lettuce, raw (green leaf, endive, romaine) Spinach, raw Turnip greens, raw & chopped   These websites have more information on Coumadin (warfarin):  FailFactory.se; VeganReport.com.au;     Atrial Fibrillation Introduction Atrial fibrillation is a type of heartbeat that is irregular or fast (rapid). If you have this condition, your heart keeps quivering in a weird (chaotic) way. This condition can make it so your heart cannot pump blood normally. Having this condition gives a person more risk for stroke, heart failure, and other heart problems. There are different types of atrial fibrillation. Talk with your doctor to learn about the type that you have. Follow these instructions at home:  Take over-the-counter and prescription medicines only as told by your doctor.  If your doctor prescribed a blood-thinning medicine, take it exactly as told. Taking too much of it can cause bleeding. If you do not take enough of it, you will not have the protection that you need against stroke and other problems.  Do not use any tobacco products. These include cigarettes, chewing tobacco, and e-cigarettes. If you need help quitting, ask  your doctor.  If you have apnea (obstructive sleep apnea), manage it as told by your doctor.  Do not drink alcohol.  Do not drink beverages that have caffeine. These include coffee, soda, and tea.  Maintain a healthy weight. Do not use diet pills unless your doctor says they are safe for you. Diet pills may make heart problems worse.  Follow diet instructions as told by your doctor.  Exercise regularly as told by your doctor.  Keep all follow-up visits as told by your doctor. This is important. Contact a doctor if:  You notice a change in the speed, rhythm, or strength of your heartbeat.  You are taking a blood-thinning medicine and you notice more bruising.  You get tired more easily when you move or exercise. Get help right away if:  You have pain in your chest or your belly (abdomen).  You have sweating or weakness.  You feel sick to your stomach (nauseous).  You notice blood in your throw up (vomit), poop (stool), or pee (urine).  You are short of breath.  You suddenly have swollen feet and ankles.  You feel dizzy.  Your suddenly get weak or numb in your face, arms, or legs, especially if it happens on one side of your body.  You have trouble talking, trouble understanding, or both.  Your face or your eyelid  droops on one side. These symptoms may be an emergency. Do not wait to see if the symptoms will go away. Get medical help right away. Call your local emergency services (911 in the [place] Do not drive yourself to the hospital.  This information is not intended to replace advice given to you by your health care provider. Make sure you discuss any questions you have with your health care provider. Document Released: 09/07/2008 Document Revised: 05/06/2016 Document Reviewed: 03/26/2015  2017 Elsevier    Electrical Cardioversion, Care After This sheet gives you information about how to care for yourself after your procedure. Your health care provider may also  give you more specific instructions. If you have problems or questions, contact your health care provider. What can I expect after the procedure? After the procedure, it is common to have:  Some redness on the skin where the shocks were given. Follow these instructions at home:  Do not drive for 24 hours if you were given a medicine to help you relax (sedative).  Take over-the-counter and prescription medicines only as told by your health care provider.  Ask your health care provider how to check your pulse. Check it often.  Rest for 48 hours after the procedure or as told by your health care provider.  Avoid or limit your caffeine use as told by your health care provider. Contact a health care provider if:  You feel like your heart is beating too quickly or your pulse is not regular.  You have a serious muscle cramp that does not go away. Get help right away if:  You have discomfort in your chest.  You are dizzy or you feel faint.  You have trouble breathing or you are short of breath.  Your speech is slurred.  You have trouble moving an arm or leg on one side of your body.  Your fingers or toes turn cold or blue. This information is not intended to replace advice given to you by your health care provider. Make sure you discuss any questions you have with your health care provider. Document Released: 09/19/2013 Document Revised: 07/02/2016 Document Reviewed: 06/04/2016 Elsevier Interactive Patient Education  2017 [place].                  Diabetes Mellitus and Food It is important for you to manage your blood sugar (glucose) level. Your blood glucose level can be greatly affected by what you eat. Eating healthier foods in the appropriate amounts throughout the day at about the same time each day will help you control your blood glucose level. It can also help slow or prevent worsening of your diabetes mellitus. Healthy eating may even help you improve the  level of your blood pressure and reach or maintain a healthy weight. General recommendations for healthful eating and cooking habits include:  Eating meals and snacks regularly. Avoid going long periods of time without eating to lose weight.  Eating a diet that consists mainly of plant-based foods, such as fruits, vegetables, nuts, legumes, and whole grains.  Using low-heat cooking methods, such as baking, instead of high-heat cooking methods, such as deep frying. Work with your dietitian to make sure you understand how to use the Nutrition Facts information on food labels. How can food affect me? Carbohydrates  Carbohydrates affect your blood glucose level more than any other type of food. Your dietitian will help you determine how many carbohydrates to eat at each meal and teach you how to count carbohydrates.  Counting carbohydrates is important to keep your blood glucose at a healthy level, especially if you are using insulin or taking certain medicines for diabetes mellitus. Alcohol  Alcohol can cause sudden decreases in blood glucose (hypoglycemia), especially if you use insulin or take certain medicines for diabetes mellitus. Hypoglycemia can be a life-threatening condition. Symptoms of hypoglycemia (sleepiness, dizziness, and disorientation) are similar to symptoms of having too much alcohol. If your health care provider has given you approval to drink alcohol, do so in moderation and use the following guidelines:  Women should not have more than one drink per day, and men should not have more than two drinks per day. One drink is equal to:  12 oz of beer.  5 oz of wine.  1 oz of hard liquor.  Do not drink on an empty stomach.  Keep yourself hydrated. Have water, diet soda, or unsweetened iced tea.  Regular soda, juice, and other mixers might contain a lot of carbohydrates and should be counted. What foods are not recommended? As you make food choices, it is important to  remember that all foods are not the same. Some foods have fewer nutrients per serving than other foods, even though they might have the same number of calories or carbohydrates. It is difficult to get your body what it needs when you eat foods with fewer nutrients. Examples of foods that you should avoid that are high in calories and carbohydrates but low in nutrients include:  Trans fats (most processed foods list trans fats on the Nutrition Facts label).  Regular soda.  Juice.  Candy.  Sweets, such as cake, pie, doughnuts, and cookies.  Fried foods. What foods can I eat? Eat nutrient-rich foods, which will nourish your body and keep you healthy. The food you should eat also will depend on several factors, including:  The calories you need.  The medicines you take.  Your weight.  Your blood glucose level.  Your blood pressure level.  Your cholesterol level. You should eat a variety of foods, including:  Protein.  Lean cuts of meat.  Proteins low in saturated fats, such as fish, egg whites, and beans. Avoid processed meats.  Fruits and vegetables.  Fruits and vegetables that may help control blood glucose levels, such as apples, mangoes, and yams.  Dairy products.  Choose fat-free or low-fat dairy products, such as milk, yogurt, and cheese.  Grains, bread, pasta, and rice.  Choose whole grain products, such as multigrain bread, whole oats, and brown rice. These foods may help control blood pressure.  Fats.  Foods containing healthful fats, such as nuts, avocado, olive oil, canola oil, and fish. Does everyone with diabetes mellitus have the same meal plan? Because every person with diabetes mellitus is different, there is not one meal plan that works for everyone. It is very important that you meet with a dietitian who will help you create a meal plan that is just right for you. This information is not intended to replace advice given to you by your health care  provider. Make sure you discuss any questions you have with your health care provider. Document Released: 08/26/2005 Document Revised: 05/06/2016 Document Reviewed: 10/26/2013 Elsevier Interactive Patient Education  2017 [employer].

## 2016-11-30 NOTE — Progress Notes (Signed)
ANTICOAGULATION CONSULT NOTE - Follow Up Consult  Pharmacy Consult for Coumadin Indication: atrial fibrillation  Allergies  Allergen Reactions  . Metformin And Related Nausea Only and Other (See Comments)    Loss of appetite  . Lisinopril Cough    Patient Measurements: Height: 5\' 11"  (180.3 cm) Weight: 229 lb 1.6 oz (103.9 kg) IBW/kg (Calculated) : 75.3  Vital Signs: Temp: 97.4 F (36.3 C) (12/19 1108) Temp Source: Oral (12/19 1108) BP: 138/90 (12/19 1108) Pulse Rate: 120 (12/19 1108)  Labs:  Recent Labs  11/29/16 1800 11/29/16 2151 11/30/16 0337 11/30/16 1008  HGB 14.8  --  13.6  --   HCT 42.4  --  38.7*  --   PLT 231  --  211  --   APTT 45*  --   --   --   LABPROT 27.8*  --   --  26.5*  INR 2.54  --   --  2.39  CREATININE 1.34*  --  1.27*  --   TROPONINI  --  <0.03 <0.03 0.03*    Estimated Creatinine Clearance (by C-G formula based on SCr of 1.27 mg/dL (H)) Male: 57 mL/min Male: 69.2 mL/min  Assessment:   65 yr old male on Coumadin for atrial fibrillation.   INR remains therapeutic (2.39) on home regimen of 1.5 mg daily.   Amiodarone added today.  May effect Coumadin dosing over the next few days.  Goal of Therapy:  INR 2-3 Monitor platelets by anticoagulation protocol: Yes   Plan:   Continue Coumadin 1.5 mg daily.  Daily PT/INR.  Watch for Amiodarone effect on PT/INR.  [name], [place] Pager: [phone] 11/30/2016,1:02 PM

## 2016-11-30 NOTE — Progress Notes (Signed)
Patient was given IV Lasix at 05:30 and woke up startled because he had to go to the BR, attempted to use the urinal at the bedside but voided all over the floor and almost pulled the monitor off the wall attempting to get to the BR, prior to this occurrence his HR went into the 120-140's. He has been off the Cardizem drip since 2AM with HR in the 80-90's for past couple of hours. Patient came out of the BR and was c/o nausea, a little SOB and chest tightness (felt like a bear hug) rated 6/10 with no radiation, put O2 on at 2 LPM via N/C and will get an EKG and also got some Zofran 4 mg and gave slow IVP while giving report to Fraser Din. RN. Patient VSS at this time and he states that he's feeling better. Patient was given 40 MEQ's of Potassium for a Potassium level of 3.3 as per MD order, will transfer care to [facility] in stable condition. Patient has remained NPO since midnight except for small amount of H2O to take his Potassium.

## 2016-11-30 NOTE — Progress Notes (Signed)
[facility] PROGRESS NOTE  [name] [name] HEN:[id number] DOB: 03/23/1951 DOA: 11/29/2016  PCP: [name], PA  Brief History/Interval Summary: 65 year old Caucasian male with medical history significant of HTN, HLD, systolic CHF last EF 36-14% in 09/2016, CAD, MVP, s/p AICD/PM, and gerd; who presents with complaints of 2-3 days of feeling sick. Patient reports symptoms of feeling dizzy/ lightheaded with intermittent chest discomfort and palpitations. Patient was found to have atrial fibrillation with RVR. He was hospitalized for further management. He had similar admission back in October and was cardioverted at that time.  Reason for Visit: Atrial fibrillation  Consultants: Cardiology  Procedures: None  Antibiotics: None  Subjective/Interval History: Patient denies any chest pain. He does have some difficulty breathing. He is very frustrated that he is back in atrial fibrillation. He also complains of discomfort in his scrotum. This has been ongoing for a few months.  ROS: Denies any nausea or vomiting  Objective:  Vital Signs  Vitals:   11/30/16 0350 11/30/16 0405 11/30/16 0752 11/30/16 1108  BP: 99/67 114/67 (!) 141/101 138/90  Pulse: (!) 54 76 66 (!) 120  Resp: 14 20 (!) 21 20  Temp:  97.7 F (36.5 C) 98.4 F (36.9 C) 97.4 F (36.3 C)  TempSrc:  Oral Oral Oral  SpO2: 97% 96% 100% 100%  Weight:  103.9 kg (229 lb 1.6 oz)    Height:        Intake/Output Summary (Last 24 hours) at 11/30/16 1307 Last data filed at 11/30/16 1048  Gross per 24 hour  Intake           264.55 ml  Output             1300 ml  Net         -1035.45 ml   Filed Weights   11/29/16 1635 11/29/16 2136 11/30/16 0405  Weight: 104.3 kg (230 lb) 104 kg (229 lb 3.2 oz) 103.9 kg (229 lb 1.6 oz)    General appearance: alert, cooperative, appears stated age and no distress Resp: Coarse breath sounds bilaterally.. No wheezing. Cardio: S1, S2 is irregularly irregular. No S3, S4. No  rubs, murmurs, or bruit. No pedal edema. GI: soft, non-tender; bowel sounds normal; no masses,  no organomegaly Scrotal examination revealed normal-sized testes. Some tenderness in the left scrotal area. No erythema. No masses appreciated. Extremities: extremities normal, atraumatic, no cyanosis or edema Neurologic: No focal deficits   Lab Results:  Data Reviewed: I have personally reviewed following labs and imaging studies  CBC:  Recent Labs Lab 11/29/16 1800 11/30/16 0337  WBC 8.3 6.8  HGB 14.8 13.6  HCT 42.4 38.7*  MCV 85.8 84.3  PLT 231 431    Basic Metabolic Panel:  Recent Labs Lab 11/29/16 1800 11/30/16 0337  NA 136 136  K 4.1 3.3*  CL 104 105  CO2 23 22  GLUCOSE 273* 142*  BUN 12 12  CREATININE 1.34* 1.27*  CALCIUM 9.2 8.8*    GFR: Estimated Creatinine Clearance (by C-G formula based on SCr of 1.27 mg/dL (H)) Male: 57 mL/min Male: 69.2 mL/min  Liver Function Tests:  Recent Labs Lab 11/29/16 1800  AST 22  ALT 17  ALKPHOS 69  BILITOT 0.8  PROT 6.3*  ALBUMIN 3.7    Coagulation Profile:  Recent Labs Lab 11/29/16 1800 11/30/16 1008  INR 2.54 2.39    Cardiac Enzymes:  Recent Labs Lab 11/29/16 2151 11/30/16 0337 11/30/16 1008  TROPONINI <0.03 <0.03 0.03*    CBG:  Recent Labs Lab 11/29/16 1647 11/29/16 2141 11/30/16 0721 11/30/16 1127 11/30/16 1214  GLUCAP 313* 181* 133* 168* 173*     Recent Results (from the past 240 hour(s))  MRSA PCR Screening     Status: None   Collection Time: 11/29/16  9:43 PM  Result Value Ref Range Status   MRSA by PCR NEGATIVE NEGATIVE Final    Comment:        The GeneXpert MRSA Assay (FDA approved for NASAL specimens only), is one component of a comprehensive MRSA colonization surveillance program. It is not intended to diagnose MRSA infection nor to guide or monitor treatment for MRSA infections.       Radiology Studies: Dg Chest Port 1 View  Result Date: 11/29/2016 CLINICAL  DATA:  weakness today. Pt is SOB when walking. Nonsmoker. Hx of diabetes, hypertension, afib, CHF, ischemic cardiomyopathy, mitral valve prolapse. Heart catheterization (2013, 2016). Defibrillator placement(2007), change in 2014.Pt had open heart surgery for valve repair in 2004. EXAM: PORTABLE CHEST - 1 VIEW COMPARISON:  09/27/2016 FINDINGS: Lungs are clear. Heart size normal. Previous median sternotomy. Left subclavian AICD stable. No effusion. IMPRESSION: No acute disease.  Stable postop changes. Electronically Signed   By: Lucrezia Europe M.D.   On: 11/29/2016 17:52     Medications:  Scheduled: . aspirin EC  81 mg Oral Daily  . atorvastatin  20 mg Oral q1800  . carvedilol  25 mg Oral BID WC  . FLUoxetine  40 mg Oral Daily  . insulin aspart  0-5 Units Subcutaneous QHS  . insulin aspart  0-9 Units Subcutaneous TID WC  . insulin aspart protamine- aspart  25 Units Subcutaneous BID WC  . irbesartan  75 mg Oral Daily  . pantoprazole  40 mg Oral Daily  . tamsulosin  0.4 mg Oral Daily  . warfarin  1.5 mg Oral q1800  . Warfarin - Pharmacist Dosing Inpatient   Does not apply q1800   Continuous: . amiodarone 60 mg/hr (11/30/16 1226)   Followed by  . amiodarone     JJH:ERDEYCXKGYJEH **OR** acetaminophen, albuterol, nitroGLYCERIN, ondansetron **OR** ondansetron (ZOFRAN) IV, traZODone  Assessment/Plan:  Principal Problem:   Atrial fibrillation (HCC) Active Problems:   Hypertension   Hyperlipidemia   GERD (gastroesophageal reflux disease)   Automatic implantable cardioverter-defibrillator in situ   CAD (coronary artery disease)   Chronic systolic heart failure (HCC)    Atrial fibrillation With RVR Patient was placed on a diltiazem infusion. Patient blood pressure dropped early this morning and so the infusion was discontinued. He is on a beta blocker. Cardiology has been consulted. 2. Initiate amiodarone infusion and cardioversion tomorrow. Patient CHADsVASc score = 4. Continue  warfarin.  Acute on chronic systolic CHF exacerbation Patient's BNP was elevated similar to previous admission. His x-ray showing cardiomegaly with congestion. Last echocardiogram showing EF of 20-25% by TEEin 09/2016. Patient was given Lasix. He is feeling better. Defer further doses of diuretics to cardiology.  Diabetes mellitus type 2:  Reduced home NovoLog 70/30 from 35 to 25 units subcutaneous twice a day as patient has been more controlled environment. CBGs every before meals and at bedtime with sensitive sliding scale insulin. Hold Januvia  Scrotal pain Patient underwent ultrasound of his scrotum in October. No masses were noted. Evidence for bilateral varicoceles. Patient was reassured. He will need outpatient appointment with urology.  CAD Stable. Continue aspirin  AICD in place Stable  Essential hypertension Continue home medications.  Hyperlipidemia Continue atorvastatin  Anxiety  Continue Prozac  GERD  Continue PPI.   DVT Prophylaxis: On warfarin    Code Status: Full code  Family Communication: Discussed with the patient  Disposition Plan: Management per cardiology.   LOS: 1 day   [place] [facility] Pager [phone] 11/30/2016, 1:07 PM  If 7PM-7AM, please contact night-coverage at [email or web address], password [facility]

## 2016-11-30 NOTE — Progress Notes (Signed)
Spoke with Dr Tamala Julian regarding patient's low B/P, monitor showed 75/60 and manually I got 88/60, turned drip dow to 2.5mg  of Cardizem and text paged MD. Patient's skin is warm and dry with good pulses and HR 70's with some paced beats and some PVC's but neuro status intact and patient's only complaint is that he's tired, will continue to monitor.

## 2016-11-30 NOTE — Care Management Obs Status (Signed)
[place] NOTIFICATION   Patient Details  Name: [name] [name] MRN: [id number] Date of Birth: Aug 02, 1951   Medicare Observation Status Notification Given:  Yes    Bethena Roys, RN 11/30/2016, 11:35 AM

## 2016-11-30 NOTE — Care Management Note (Addendum)
Case Management Note  Patient Details  Name: [name] [name] MRN: [id number] Date of Birth: November 30, 1951  Subjective/Objective: Pt presented for feelings of dizziness/ lightheaded with intermittent chest discomfort and palpitations. Pt is from home with wife that has cancer. Pt is active with [facility]. [place] following pt in regards to insulin medication and affordability. Pt will receive 70/30 insulin vial before pt is d/c.                      Action/Plan: CM will continue to monitor for additional needs.   Expected Discharge Date:                  Expected Discharge Plan:  Home/Self Care  In-House Referral:  NA  Discharge planning Services  NA  Post Acute Care Choice:  NA Choice offered to:  NA  DME Arranged:  N/A DME Agency:  NA  HH Arranged:  NA HH Agency:  NA  Status of Service:  Completed, signed off  If discussed at [place] of Stay Meetings, dates discussed:    Additional Comments: 12-02-16 [place], RN, BSN [phone]  Plan for TEE/DCCV 12-03-16. CM will continue to monitor.  Bethena Roys, RN 11/30/2016, 11:36 AM

## 2016-11-30 NOTE — Consult Note (Signed)
   [facility] Inpatient Consult   11/30/2016  [place] 02/09/1951 [id number]    Made aware of [name] [name] involvement of [place] Management by [facility] Pharmacist. Chart reviewed and [place] team already aware of admission. Please see chart review then notes for patient outreach details from [facility] team. Inpatient already aware of [place] Management's involvement. Appreciative of inpatient RNCM collaboration with [facility] Pharmacist. [place] Management to follow up post hospital discharge.  Marthenia Rolling, MSN-Ed, RN,BSN [facility] Liaison [phone]

## 2016-11-30 NOTE — Patient Outreach (Deleted)
[facility]) Care Management  11/30/2016  [place] 1951/04/19 [id number]   CSW made initial contact with patient today by phone. CSW introduced self and role. Patient agreeable to CSW support and visit; "I don't read very well". CSW offered home visit and patient agrees to this; plan in home visit on 12/09/16.   Eduard Clos, MSW, [place] Worker  [place] [phone]

## 2016-11-30 NOTE — Consult Note (Signed)
Cardiology Consult    Patient ID: [name] [name] MRN: [id number], DOB/AGE: [birth year]   Admit date: 11/29/2016 Date of Consult: 11/30/2016  Primary Physician: [name], PA Reason for Consult: Afib Primary Cardiologist: Dr. Martinique  Requesting Provider: Dr. [place] Pink   Patient Profile    [name] [name] is a 65 year old male with a past medical history of persistent atrial fibrillation, DM, CKD stage III, chronic combined systolic and diastolic CHF with last EF of 20-25% in Oct. 2017, ischemic cardiomyopathy s/p St. [name] AICD. He presented to the ED on 11/29/16 with SOB, dizziness and chest pain. Found to be in Afib with rate of 124.   History of Present Illness    [name] [name] tells me that he has not felt well in days. He feels fatigued, has decreased activity tolerance and dyspnic. He endorses palpitations that began about 2-3 days prior to admission that were intermittent. He took his pile and noticed that his HR ranged from 90's-170's, prompting him to seek medical attention.   He is followed by EP, Dr. Rayann Heman and was admitted from 09/27/16-09/30/16 with PAF and was placed on Amiodarone gtt, and underwent successful TEE/DCCV. Amiodarone was not continued at discharge. He was seen by Chanetta Marshall in the outpatient setting on 10/20/16, and was in NSR, his ICD was interrogated and he did not have any episodes of AF or VT.   Regarding his history of ischemic cardiomyopathy, he underwent stenting of the proximal LAD and diagonal branch in 2000. Cardiac caths in 2005 Dorthula Rue MD) and 2013 ([place]) showed no obstructive disease. He is s/p MV repair at [facility] in 2004. He has chronic moderate MR. In February 2016 he underwent right and left heart cath. He had stenting of the first diagonal and first OM with DES. There was diffuse LAD disease treated medically. EF 30-35%. Mild MR and normal right heart pressures    Past Medical History   Past Medical History:    Diagnosis Date  . Anxiety   . CAD (coronary artery disease)    a.  1993 s/p MI - [employer];  b. s/p BMS to LAD '00;  c. PTCA 2nd diagonal 2010;  d. 02/18/12 Cath: moderate nonobs dzs - med rx;  e.  01/2015 Cath: LM nl, LAD 40-57mISR, 70-879m, d1 90p (3.0x16 Synergy DES), D2 50-60, LCX nl, OM1 50p, 8086m.5x12 Synergy DES), RCA nl, EF 30-35%.  . Chronic combined systolic and diastolic CHF (congestive heart failure) (HCCTajique  a. 12/2014 Echo: EF 30-35%, Gr2 DD, mod MR, sev dil LA.  . CKD (chronic kidney disease), stage III   . Depression   . ED (erectile dysfunction)   . GERD (gastroesophageal reflux disease)   . Hyperlipidemia   . Hypertension   . Ischemic cardiomyopathy    a. s/p St. [name] (Atlas) ICD [place];  b. 12/2014 Echo: Ef 30-35%.  . MVP (mitral valve prolapse)    a. [facility]  . PMarland Kitchenrsistent atrial fibrillation (HCCNorth Pearsall  a. noted on ICD interrogation '10 - not previously on [place]AC[place]CHA2DS2VASc = 5.  . Type II diabetes mellitus (HCCCushing  uncontrolled    Past Surgical History:  Procedure Laterality Date  . CARDIAC DEFIBRILLATOR PLACEMENT  2007   [place] 7001 RV lead and a SJM Atlas ICD followed by Dr KleCaryl Comes CARDIOVERSION N/A 09/29/2016   Procedure: CARDIOVERSION;  Surgeon:  Lelon Perla, MD;  Location: [facility] ENDOSCOPY;  Service: Cardiovascular;  Laterality: N/A;  . IMPLANTABLE CARDIOVERTER DEFIBRILLATOR (ICD) GENERATOR CHANGE N/A 03/19/2013   SJM Fortify ST DR generator placed by Dr Lovena Le, part of Analyze ST study  . LEFT AND RIGHT HEART CATHETERIZATION WITH CORONARY ANGIOGRAM N/A 01/14/2015   Procedure: LEFT AND RIGHT HEART CATHETERIZATION WITH CORONARY ANGIOGRAM;  Surgeon: Peter M Martinique, MD;  Location: [facility] CATH LAB;  Service: Cardiovascular;  Laterality: N/A;  . LEFT HEART CATHETERIZATION WITH CORONARY ANGIOGRAM N/A 02/17/2012   Procedure: LEFT HEART CATHETERIZATION WITH CORONARY ANGIOGRAM;  Surgeon: Jolaine Artist, MD;  Location: [facility] CATH LAB;  Service: Cardiovascular;  Laterality: N/A;  . MITRAL VALVE ANNULOPLASTY  2004   Archie Endo 02/17/2012  . TEE WITHOUT CARDIOVERSION N/A 09/29/2016   Procedure: TRANSESOPHAGEAL ECHOCARDIOGRAM (TEE);  Surgeon: Lelon Perla, MD;  Location: [facility] ENDOSCOPY;  Service: Cardiovascular;  Laterality: N/A;     Allergies  Allergies  Allergen Reactions  . Metformin And Related Nausea Only and Other (See Comments)    Loss of appetite  . Lisinopril Cough    Inpatient Medications    . amiodarone  150 mg Intravenous Once  . aspirin EC  81 mg Oral Daily  . atorvastatin  20 mg Oral q1800  . carvedilol  25 mg Oral BID WC  . FLUoxetine  40 mg Oral Daily  . insulin aspart  0-5 Units Subcutaneous QHS  . insulin aspart  0-9 Units Subcutaneous TID WC  . insulin aspart protamine- aspart  25 Units Subcutaneous BID WC  . irbesartan  75 mg Oral Daily  . pantoprazole  40 mg Oral Daily  . tamsulosin  0.4 mg Oral Daily  . warfarin  1.5 mg Oral q1800  . Warfarin - Pharmacist Dosing Inpatient   Does not apply q1800    Family History    Family History  Problem Relation Age of Onset  . Coronary artery disease Mother   . Colon cancer Mother   . Heart attack Mother   . Heart disease Mother   . Heart failure Mother   . Hypertension Mother   . Malignant hyperthermia Mother   . Colon cancer Sister   . Coronary artery disease Brother   . Heart disease Brother     Social History    Social History   Social History  . Marital status: Married    Spouse name: N/A  . Number of children: 8  . Years of education: N/A   Occupational History  . lawmaker     disabled   Social History Main Topics  . Smoking status: Never Smoker  . Smokeless tobacco: Never Used  . Alcohol use 0.0 oz/week     Comment: 01/14/2015 "used to drink alot; stopped completely in 1983"  . Drug use: No     Comment: 01/14/2015 "used to use about anything I could get; stopped completely in 1983"  .  Sexual activity: No   Other Topics Concern  . Not on file   Social History Narrative  . No narrative on file     Review of Systems    General:  No chills, fever, night sweats or weight changes.  Cardiovascular:  No chest pain, +dyspnea on exertion, edema, orthopnea, +palpitations, paroxysmal nocturnal dyspnea. Dermatological: No rash, lesions/masses Respiratory: No cough, dyspnea Urologic: No hematuria, dysuria Abdominal:   No nausea, vomiting, diarrhea, bright red blood per rectum, melena, or hematemesis Neurologic:  No visual changes, wkns, changes in mental status. All  other systems reviewed and are otherwise negative except as noted above.  Physical Exam    Blood pressure 138/90, pulse (!) 120, temperature 97.4 F (36.3 C), temperature source Oral, resp. rate 20, height 5' 11"  (1.803 m), weight 229 lb 1.6 oz (103.9 kg), SpO2 100 %.  General: Pleasant, NAD Psych: Normal affect. Neuro: Alert and oriented X 3. Moves all extremities spontaneously. HEENT: Normal  Neck: Supple without bruits or JVD. Lungs:  Resp regular and unlabored, CTA. Heart: irregularly irregular rhythm. No s3, s4, or murmurs. Abdomen: Soft, non-tender, non-distended, BS + x 4.  Extremities: No clubbing, cyanosis or edema. DP/PT/Radials 2+ and equal bilaterally.  Labs    Troponin [facility] of Care Test)  Recent Labs  11/29/16 1807  TROPIPOC 0.01    Recent Labs  11/29/16 2151 11/30/16 0337 11/30/16 1008  TROPONINI <0.03 <0.03 0.03*   Lab Results  Component Value Date   WBC 6.8 11/30/2016   HGB 13.6 11/30/2016   HCT 38.7 (L) 11/30/2016   MCV 84.3 11/30/2016   PLT 211 11/30/2016    Recent Labs Lab 11/29/16 1800 11/30/16 0337  NA 136 136  K 4.1 3.3*  CL 104 105  CO2 23 22  BUN 12 12  CREATININE 1.34* 1.27*  CALCIUM 9.2 8.8*  PROT 6.3*  --   BILITOT 0.8  --   ALKPHOS 69  --   ALT 17  --   AST 22  --   GLUCOSE 273* 142*   Lab Results  Component Value Date   CHOL 116 09/30/2016     HDL 26 (L) 09/30/2016   LDLCALC 58 09/30/2016   TRIG 162 (H) 09/30/2016   Lab Results  Component Value Date   DDIMER 0.43 02/17/2012     Radiology Studies    Dg Chest Port 1 View  Result Date: 11/29/2016 CLINICAL DATA:  weakness today. Pt is SOB when walking. Nonsmoker. Hx of diabetes, hypertension, afib, CHF, ischemic cardiomyopathy, mitral valve prolapse. Heart catheterization (2013, 2016). Defibrillator placement(2007), change in 2014.Pt had open heart surgery for valve repair in 2004. EXAM: PORTABLE CHEST - 1 VIEW COMPARISON:  09/27/2016 FINDINGS: Lungs are clear. Heart size normal. Previous median sternotomy. Left subclavian AICD stable. No effusion. IMPRESSION: No acute disease.  Stable postop changes. Electronically Signed   By: Lucrezia Europe M.D.   On: 11/29/2016 17:52    EKG & Cardiac Imaging    EKG: Afib, non specific ST changes in lateral leads.  Cardiac Catheterization Procedure Note 01/14/15 Procedural Findings: Hemodynamics RA 5/5/ mean 4 mm Hg RV 29/5 mm Hg PA 27/4 mean 15 mm Hg PCWP 12/16 mean 11 mm Hg LV 152/13 mm Hg AO 152/84 mean of 110 mm Hg  Oxygen saturations: PA 65% AO 97%  Cardiac Output (Fick) 4.47 L/min  Cardiac Index (Fick) 2.0 L/min/meter squared.            Coronary angiography: Coronary dominance: right  Left mainstem: Normal  Left anterior descending (LAD): The LAD is a large vessel. It is diffusely diseased in the mid to distal vessel. There is a stent in the mid vessel just past the takeoff of the first and second diagonal. There is 40-50% in stent disease in the proximal stent. There is diffuse 70-80% disease in the mid to distal vessel. The first diagonal is a large branch with 90% proximal stenosis. The second diagonal is small with 50-60% mid vessel disease.   Left circumflex (LCx): The LCx gives rise to a single OM branch. There  is 50% disease in the proximal OM. The mid OM has a focal 80% lesion.  Right coronary artery (RCA):  The RCA is a very large dominant vessel. It has no significant disease.   Left ventriculography: Left ventricular systolic function is abnormal, there is global hypokinesis. LVEF is estimated at 30-35%, there is mild mitral regurgitation   PCI procedure: We proceeded at this point with PCI of the first diagonal and first OM. The patient was given Effient 60 mg po. He was anticoagulated with IV bivalirudin. After therapeutic ACT the LCA was accessed with a 6Fr XBLAD 3.5 guide. The first diagonal lesion was crossed with a prowater wire and predilated with a 2.5 mm balloon. Crossing was difficult due to severe tortuosity of the proximal diagonal. A Guideliner support catheter was placed proximal to the diagonal and then we were able to deliver a 3.0 x 16 mm Synergy stent. This was dilated to 14 atm with the stent balloon. Following PCI, there was 0% residual stenosis and TIMI 3 flow. Angiography confirmed an excellent result.  We next addressed the lesion in the mid OM1. This lesion was crossed with the prowater wire and predilated with a 2.25 mm balloon. The lesion was then stented with a 2.5 x 12 mm Synergy stent. This was inflated to 14 atm with the stent balloon. Following PCI, there was 0% residual stenosis and TIMI 3 flow. Angiography confirmed and excellent result. The patient tolerated the procedure well without complications. He was transported to the recovery area for further monitoring.   Vessel #1: First diagonal Percent stenosis (pre): 90% TIMI flow (pre): 3 Stent: 3.0 x 16 mm Synergy Percent stenosis (post): 0% TIMI flow (post ) 3  Vessel #2: first OM Percent stenosis (pre) 80% TIMI flow (pre) 3 Stent 2.5 x 12 mm Synergy Percent stenosis (post) 0% TIMI flow (post) 3   Final Conclusions:   1. Severe 2 vessel obstructive CAD 2. Severe LV dysfunction. 3. Normal right heart pressures. 4. Successful stenting of the first diagonal with a DES 5. Successful stenting of the first  OM with a DES.  Recommendations: Continue DAPT for one year. If patient unable to afford Effient would need to load with Plavix. Continue medical therapy for LV dysfunction. Mitral insufficiency does not appear to be hemodynamically significant. I would treat his LAD disease medically unless he has refractory angina. It is fairly diffuse.     Assessment & Plan    1. Atrial fibrillation with RVR with a history of persistent Afib: S/p DCCV in Oct. 2017, now presents with weakness and palpitations in rapid Afib. Discontinued diltiazem gtt due to reduced EF and history of ischemic cardiomyopathy.   According to EP note from November, Amiodarone is the best option in the setting of CAD and CKD. Will start Amiodarone gtt with bolus. He is very symptomatic, hypotensive and fatigued.   Contacted St. [name] for device interrogation.   This patients CHA2DS2-VASc Score and unadjusted Ischemic Stroke Rate (% per year) is equal to 7.2 % stroke rate/year from a score of 5 Above score calculated as 1 point each if present [CHF, HTN, DM, Vascular=MI/PAD/Aortic Plaque, Age if 65-74, or Male], 2 points each if present [Age > 75, or Stroke/TIA/TE]  Continue Coumadin for anticoagulation, INR is therapeutic. We will set him up for TEE/DCCV tomorrow.   2. Ischemic cardiomyopathy: On goal dose Coreg. Also on Irbesartan, would change to losartan (he has cough with ACE-I), as there is a contraindication with severe CHF and  irbesartan.   He does not appear volume overloaded on exam, chest X-Ray without pulmonary edema. With low EF, there is a narrow window for hypervolemia. Will monitor.   3. History of chronic systolic and diastolic CHF  4. DM: management per primary team.   5. CAD: last cath in 01/2015, full report above.      Signed, Arbutus Leas, NP 11/30/2016, 11:44 AM Pager: [phone]  Agree with note by Jettie Booze NP  Pt with ISCM s/p ICD implantation with EF in the 30-35% range. Mult PCI and  stent procedures most recently 01/14/15. He has PAF on coumadin AC. He is very symptomatic in AFIB and underwent TEE DCCV  In Oct after being put on IV Amio briefly. He was admitted yesterday with fatigue and increasing SOB. He is is AF with RVR. Interrogation of ICD showed onset 48 hours ago. His INR is therapeutic. Exam benign. On IV Amio now. Would transition to POP with load. Plan TEE DCCV next 24-48 hours.    Lorretta Harp, M.D., [place], [facility], [place] [address] [place], [place]  [address]  [phone] 11/30/2016 1:08 PM

## 2016-12-01 ENCOUNTER — Ambulatory Visit: Payer: Self-pay | Admitting: *Deleted

## 2016-12-01 ENCOUNTER — Other Ambulatory Visit: Payer: Self-pay | Admitting: Pharmacist

## 2016-12-01 DIAGNOSIS — I4891 Unspecified atrial fibrillation: Secondary | ICD-10-CM

## 2016-12-01 LAB — BASIC METABOLIC PANEL
ANION GAP: 9 (ref 5–15)
BUN: 15 mg/dL (ref 6–20)
CHLORIDE: 104 mmol/L (ref 101–111)
CO2: 23 mmol/L (ref 22–32)
Calcium: 8.4 mg/dL — ABNORMAL LOW (ref 8.9–10.3)
Creatinine, Ser: 1.5 mg/dL — ABNORMAL HIGH (ref 0.61–1.24)
GFR calc Af Amer: 54 mL/min — ABNORMAL LOW (ref >60)
GFR calc non Af Amer: 46 mL/min — ABNORMAL LOW (ref >60)
GLUCOSE: 261 mg/dL — AB (ref 65–99)
POTASSIUM: 3.5 mmol/L (ref 3.5–5.1)
Sodium: 136 mmol/L (ref 135–145)

## 2016-12-01 LAB — CBC
HCT: 38.4 % — ABNORMAL LOW (ref 39.0–52.0)
Hemoglobin: 13.6 g/dL (ref 13.0–17.0)
MCH: 30.2 pg (ref 26.0–34.0)
MCHC: 35.4 g/dL (ref 30.0–36.0)
MCV: 85.3 fL (ref 78.0–100.0)
Platelets: 198 10*3/uL (ref 150–400)
RBC: 4.5 MIL/uL (ref 4.22–5.81)
RDW: 12.9 % (ref 11.5–15.5)
WBC: 5.8 10*3/uL (ref 4.0–10.5)

## 2016-12-01 LAB — GLUCOSE, CAPILLARY
GLUCOSE-CAPILLARY: 319 mg/dL — AB (ref 65–99)
GLUCOSE-CAPILLARY: 281 mg/dL — AB (ref 65–99)
Glucose-Capillary: 284 mg/dL — ABNORMAL HIGH (ref 65–99)
Glucose-Capillary: 259 mg/dL — ABNORMAL HIGH (ref 65–99)

## 2016-12-01 LAB — PROTIME-INR
INR: 2.67
Prothrombin Time: 28.9 seconds — ABNORMAL HIGH (ref 11.4–15.2)

## 2016-12-01 MED ORDER — INSULIN ASPART PROT & ASPART (70-30 MIX) 100 UNIT/ML ~~LOC~~ SUSP
25.0000 [IU] | Freq: Two times a day (BID) | SUBCUTANEOUS | Status: DC
  Administered 2016-12-01 – 2016-12-02 (×2): 25 [IU] via SUBCUTANEOUS
  Filled 2016-12-01: qty 10

## 2016-12-01 MED ORDER — INSULIN ASPART PROT & ASPART (70-30 MIX) 100 UNIT/ML ~~LOC~~ SUSP
30.0000 [IU] | Freq: Two times a day (BID) | SUBCUTANEOUS | Status: DC
  Filled 2016-12-01: qty 10

## 2016-12-01 MED ORDER — LOSARTAN POTASSIUM 50 MG PO TABS
50.0000 mg | ORAL_TABLET | Freq: Every day | ORAL | Status: DC
  Administered 2016-12-01 – 2016-12-03 (×3): 50 mg via ORAL
  Filled 2016-12-01 (×3): qty 1

## 2016-12-01 NOTE — Progress Notes (Signed)
[facility] PROGRESS NOTE  [name] [name] XBJ:[id number] DOB: 19-Jun-1951 DOA: 11/29/2016  PCP: [name], PA  Brief History/Interval Summary: 65 year old Caucasian male with medical history significant of HTN, HLD, systolic CHF last EF 30-86% in 09/2016, CAD, MVP, s/p AICD/PM, and gerd; who presents with complaints of 2-3 days of feeling sick. Patient reports symptoms of feeling dizzy/ lightheaded with intermittent chest discomfort and palpitations. Patient was found to have atrial fibrillation with RVR. He was hospitalized for further management. He had similar admission back in October and was cardioverted at that time.  Reason for Visit: Atrial fibrillation  Consultants: Cardiology  Procedures: None  Antibiotics: None  Subjective/Interval History: NO CHEST PAIN or sob, in good mood. Waiting for the TEE/ DCCV to be done as soon as possible.   ROS: Denies any nausea or vomiting  Objective:  Vital Signs  Vitals:   12/01/16 0026 12/01/16 0420 12/01/16 0731 12/01/16 1140  BP: (!) 94/58 (!) 101/56 104/60 133/72  Pulse: 70 69 81 97  Resp: 13 15 18 14   Temp: 97.5 F (36.4 C) 98.5 F (36.9 C) 97.5 F (36.4 C) 98.4 F (36.9 C)  TempSrc: Oral Oral Oral Axillary  SpO2: 99%  100% 97%  Weight:  102.4 kg (225 lb 11.2 oz)    Height:        Intake/Output Summary (Last 24 hours) at 12/01/16 1618 Last data filed at 12/01/16 1324  Gross per 24 hour  Intake          1262.44 ml  Output              450 ml  Net           812.44 ml   Filed Weights   11/29/16 2136 11/30/16 0405 12/01/16 0420  Weight: 104 kg (229 lb 3.2 oz) 103.9 kg (229 lb 1.6 oz) 102.4 kg (225 lb 11.2 oz)    General appearance: alert, cooperative, appears stated age and no distress Resp: Coarse breath sounds bilaterally.. No wheezing. Cardio: S1, S2 is irregularly irregular. No S3, S4. No rubs, murmurs, or bruit. No pedal edema. GI: soft, non-tender; bowel sounds normal; no masses,  no  organomegaly Scrotal examination revealed normal-sized testes. Some tenderness in the left scrotal area. No erythema. No masses appreciated. Extremities: extremities normal, atraumatic, no cyanosis or edema Neurologic: No focal deficits   Lab Results:  Data Reviewed: I have personally reviewed following labs and imaging studies  CBC:  Recent Labs Lab 11/29/16 1800 11/30/16 0337 12/01/16 0345  WBC 8.3 6.8 5.8  HGB 14.8 13.6 13.6  HCT 42.4 38.7* 38.4*  MCV 85.8 84.3 85.3  PLT 231 211 578    Basic Metabolic Panel:  Recent Labs Lab 11/29/16 1800 11/30/16 0337 12/01/16 0345  NA 136 136 136  K 4.1 3.3* 3.5  CL 104 105 104  CO2 23 22 23   GLUCOSE 273* 142* 261*  BUN 12 12 15   CREATININE 1.34* 1.27* 1.50*  CALCIUM 9.2 8.8* 8.4*    GFR: Estimated Creatinine Clearance (by C-G formula based on SCr of 1.5 mg/dL (H)) Male: 47.9 mL/min Male: 58.2 mL/min  Liver Function Tests:  Recent Labs Lab 11/29/16 1800  AST 22  ALT 17  ALKPHOS 69  BILITOT 0.8  PROT 6.3*  ALBUMIN 3.7    Coagulation Profile:  Recent Labs Lab 11/29/16 1800 11/30/16 1008 12/01/16 0345  INR 2.54 2.39 2.67    Cardiac Enzymes:  Recent Labs Lab 11/29/16 2151 11/30/16 0337 11/30/16 1008  TROPONINI <  0.03 <0.03 0.03*    CBG:  Recent Labs Lab 11/30/16 1214 11/30/16 1658 11/30/16 2115 12/01/16 0729 12/01/16 1155  GLUCAP 173* 278* 217* 319* 284*     Recent Results (from the past 240 hour(s))  MRSA PCR Screening     Status: None   Collection Time: 11/29/16  9:43 PM  Result Value Ref Range Status   MRSA by PCR NEGATIVE NEGATIVE Final    Comment:        The GeneXpert MRSA Assay (FDA approved for NASAL specimens only), is one component of a comprehensive MRSA colonization surveillance program. It is not intended to diagnose MRSA infection nor to guide or monitor treatment for MRSA infections.       Radiology Studies: Dg Chest Port 1 View  Result Date:  11/29/2016 CLINICAL DATA:  weakness today. Pt is SOB when walking. Nonsmoker. Hx of diabetes, hypertension, afib, CHF, ischemic cardiomyopathy, mitral valve prolapse. Heart catheterization (2013, 2016). Defibrillator placement(2007), change in 2014.Pt had open heart surgery for valve repair in 2004. EXAM: PORTABLE CHEST - 1 VIEW COMPARISON:  09/27/2016 FINDINGS: Lungs are clear. Heart size normal. Previous median sternotomy. Left subclavian AICD stable. No effusion. IMPRESSION: No acute disease.  Stable postop changes. Electronically Signed   By: Lucrezia Europe M.D.   On: 11/29/2016 17:52     Medications:  Scheduled: . aspirin EC  81 mg Oral Daily  . atorvastatin  20 mg Oral q1800  . carvedilol  25 mg Oral BID WC  . FLUoxetine  40 mg Oral Daily  . insulin aspart  0-5 Units Subcutaneous QHS  . insulin aspart  0-9 Units Subcutaneous TID WC  . insulin aspart protamine- aspart  25 Units Subcutaneous BID WC  . losartan  50 mg Oral Daily  . pantoprazole  40 mg Oral Daily  . tamsulosin  0.4 mg Oral Daily  . warfarin  1.5 mg Oral q1800  . Warfarin - Pharmacist Dosing Inpatient   Does not apply q1800   Continuous: . amiodarone 30 mg/hr (12/01/16 1246)   EGB:[id number] **OR** acetaminophen, albuterol, nitroGLYCERIN, ondansetron **OR** ondansetron (ZOFRAN) IV, traZODone  Assessment/Plan:  Principal Problem:   Atrial fibrillation (HCC) Active Problems:   Hypertension   Hyperlipidemia   GERD (gastroesophageal reflux disease)   Automatic implantable cardioverter-defibrillator in situ   CAD (coronary artery disease)   Chronic systolic heart failure (HCC)    Atrial fibrillation With RVR Initially on cardizem gtt, changed to amiodarone gtt. Rate is better today. Cardiology consulted and plan for TEE in the next 48 hours.  Patient CHADsVASc score = 4. Continue warfarin.  Acute on chronic systolic CHF exacerbation Patient's BNP was elevated similar to previous admission. His x-ray showing  cardiomegaly with congestion. Last echocardiogram showing EF of 20-25% by TEEin 09/2016. Improving.   Diabetes mellitus type 2:  CBG (last 3)   Recent Labs  11/30/16 2115 12/01/16 0729 12/01/16 1155  GLUCAP 217* 319* 284*    cbg's slightly elevated, will increase novolog 70/30 to 30  Units bid  and resume SSI.    Scrotal pain Patient underwent ultrasound of his scrotum in October. No masses were noted. Evidence for bilateral varicoceles. Patient was reassured. He will need outpatient appointment with urology.  CAD Stable. Continue aspirin  AICD in place Stable  Essential hypertension Well controlled.  Continue home medications.  Hyperlipidemia Continue atorvastatin  Anxiety  Continue Prozac  GERD Continue PPI.   DVT Prophylaxis: On warfarin    Code Status: Full code  Family Communication:  none at bedside Disposition Plan: Management per cardiology.TEE in the next 48 hours.    LOS: 2 days   [facility]  [facility] Pager [phone]  12/01/2016, 4:18 PM  If 7PM-7AM, please contact night-coverage at [email or web address], password [facility]

## 2016-12-01 NOTE — Progress Notes (Signed)
Patient Name: [name] [name] Date of Encounter: 12/01/2016  Primary Cardiologist: Dr. Martinique   Hospital Problem List     Principal Problem:   Atrial fibrillation [facility]) Active Problems:   Hypertension   Hyperlipidemia   GERD (gastroesophageal reflux disease)   Automatic implantable cardioverter-defibrillator in situ   CAD (coronary artery disease)   Chronic systolic heart failure ([place])     Subjective   Feels better this am, still feels weak but stronger than yesterday. Denies chest pain .   Inpatient Medications    Scheduled Meds: . aspirin EC  81 mg Oral Daily  . atorvastatin  20 mg Oral q1800  . carvedilol  25 mg Oral BID WC  . FLUoxetine  40 mg Oral Daily  . insulin aspart  0-5 Units Subcutaneous QHS  . insulin aspart  0-9 Units Subcutaneous TID WC  . insulin aspart protamine- aspart  25 Units Subcutaneous BID WC  . irbesartan  75 mg Oral Daily  . pantoprazole  40 mg Oral Daily  . tamsulosin  0.4 mg Oral Daily  . warfarin  1.5 mg Oral q1800  . Warfarin - Pharmacist Dosing Inpatient   Does not apply q1800   Continuous Infusions: . amiodarone 30 mg/hr (12/01/16 0015)   PRN Meds: acetaminophen **OR** acetaminophen, albuterol, nitroGLYCERIN, ondansetron **OR** ondansetron (ZOFRAN) IV, traZODone   Vital Signs    Vitals:   11/30/16 2033 12/01/16 0026 12/01/16 0420 12/01/16 0731  BP: 104/63 (!) 94/58 (!) 101/56 104/60  Pulse: 94 70 69 81  Resp: 18 13 15 18   Temp: 97.4 F (36.3 C) 97.5 F (36.4 C) 98.5 F (36.9 C) 97.5 F (36.4 C)  TempSrc: Oral Oral Oral Oral  SpO2: 99% 99%  100%  Weight:   225 lb 11.2 oz (102.4 kg)   Height:        Intake/Output Summary (Last 24 hours) at 12/01/16 0807 Last data filed at 12/01/16 0600  Gross per 24 hour  Intake          1422.44 ml  Output             1650 ml  Net          -227.56 ml   Filed Weights   11/29/16 2136 11/30/16 0405 12/01/16 0420  Weight: 229 lb 3.2 oz (104 kg) 229 lb 1.6 oz (103.9 kg) 225 lb  11.2 oz (102.4 kg)    Physical Exam    GEN: Well nourished, well developed, in no acute distress.  HEENT: Grossly normal.  Neck: Supple, no JVD, carotid bruits, or masses. Cardiac: RRR, no murmurs, rubs, or gallops. No clubbing, cyanosis, edema.  Radials/DP/PT 2+ and equal bilaterally.  Respiratory:  Respirations regular and unlabored, clear to auscultation bilaterally. GI: Soft, nontender, nondistended, BS + x 4. MS: no deformity or atrophy. Skin: warm and dry, no rash. Neuro:  Strength and sensation are intact. Psych: AAOx3.  Normal affect.  Labs    CBC  Recent Labs  11/30/16 0337 12/01/16 0345  WBC 6.8 5.8  HGB 13.6 13.6  HCT 38.7* 38.4*  MCV 84.3 85.3  PLT 211 762   Basic Metabolic Panel  Recent Labs  11/30/16 0337 12/01/16 0345  NA 136 136  K 3.3* 3.5  CL 105 104  CO2 22 23  GLUCOSE 142* 261*  BUN 12 15  CREATININE 1.27* 1.50*  CALCIUM 8.8* 8.4*   Liver Function Tests  Recent Labs  11/29/16 1800  AST 22  ALT 17  ALKPHOS 69  BILITOT 0.8  PROT 6.3*  ALBUMIN 3.7   Cardiac Enzymes  Recent Labs  11/29/16 2151 11/30/16 0337 11/30/16 1008  TROPONINI <0.03 <0.03 0.03*     Telemetry    Afib, more rate controlled in the 80-90's- Personally Reviewed    Radiology    Dg Chest Port 1 View  Result Date: 11/29/2016 CLINICAL DATA:  weakness today. Pt is SOB when walking. Nonsmoker. Hx of diabetes, hypertension, afib, CHF, ischemic cardiomyopathy, mitral valve prolapse. Heart catheterization (2013, 2016). Defibrillator placement(2007), change in 2014.Pt had open heart surgery for valve repair in 2004. EXAM: PORTABLE CHEST - 1 VIEW COMPARISON:  09/27/2016 FINDINGS: Lungs are clear. Heart size normal. Previous median sternotomy. Left subclavian AICD stable. No effusion. IMPRESSION: No acute disease.  Stable postop changes. Electronically Signed   By: Lucrezia Europe M.D.   On: 11/29/2016 17:52    Patient Profile     [name] [name] is a 65 year old male  with a past medical history of persistent atrial fibrillation, DM, CKD stage III, chronic combined systolic and diastolic CHF with last EF of 20-25% in Oct. 2017, ischemic cardiomyopathy s/p [name] AICD. He presented to the ED on 11/29/16 with SOB, dizziness and chest pain. Found to be in Afib with rate of 124.    Assessment & Plan    1. Atrial fibrillation with RVR with a history of persistent Afib: S/p DCCV in Oct. 2017, now presents with weakness and palpitations in rapid Afib. Discontinued diltiazem gtt due to reduced EF and history of ischemic cardiomyopathy.   On Amiodarone gtt, rates better controlled now.  Contacted [name] for device interrogation - Afib began 11/28/16 around 13:00.   This patients CHA2DS2-VASc Score and unadjusted Ischemic Stroke Rate (% per year) is equal to 7.2 % stroke rate/year from a score of 5 Above score calculated as 1 point each if present [CHF, HTN, DM, Vascular=MI/PAD/Aortic Plaque, Age if 65-74, or Male], 2 points each if present [Age > 75, or Stroke/TIA/TE]  Continue Coumadin for anticoagulation, INR is therapeutic. We will set him up for TEE/DCCV tomorrow.   2. Ischemic cardiomyopathy: On goal dose Coreg. Also on Irbesartan, would change to losartan (he has cough with ACE-I), as there is a contraindication with severe CHF and irbesartan.   He does not appear volume overloaded on exam, chest X-Ray without pulmonary edema. With low EF, there is a narrow window for hypervolemia. Will monitor.   3. History of chronic systolic and diastolic CHF  4. DM: management per primary team.   5. CAD: last cath in 01/2015, full report above.   Signed, Arbutus Leas, NP  12/01/2016, 8:07 AM  Patient seen and examined. I agree with the assessment and plan as detailed above. See also my additional thoughts below.   The plan is to push for TEE cardioversion tomorrow.  Dola Argyle, MD, [facility] 12/01/2016 9:50 AM

## 2016-12-01 NOTE — Patient Outreach (Signed)
[facility]) Care Management  12/01/2016  DYLAND PANUCO November 21, 1951 [id number]   65 year old male referred to [place] for medication assistance.  Patient is currently hospitalized at [facility] and today I visited patient at the hospital to deliver his Novolog 70/30 prescription in which [place] from [place] using the [facility] medication emergency fund.    Assessment: [employer] Medicare patient that cannot afford the $47 copay for his insulin or his Januvia.  [facility] has provided him a month supply of Januvia Novolog 70/30, omeprazole and aspirin using our emergency medication fund.  Patient is awaiting results of low income subsidy application which was submitted on 12/01/2016 and is also awaiting [facility] social work consult to see if additional housing/food/other resources are available in [place].    Plan: Provided Novolog 70/30 to last patient until 2018. Will plan for followup phone call next week to schedule home visit once patient is discharged from [facility].  Bennye Alm, PharmD [facility] PGY2 Pharmacy Resident [phone]

## 2016-12-01 NOTE — Progress Notes (Signed)
ANTICOAGULATION CONSULT NOTE - Follow Up Consult  Pharmacy Consult for Coumadin Indication: atrial fibrillation  Allergies  Allergen Reactions  . Metformin And Related Nausea Only and Other (See Comments)    Loss of appetite  . Lisinopril Cough    Patient Measurements: Height: 5\' 11"  (180.3 cm) Weight: 225 lb 11.2 oz (102.4 kg) IBW/kg (Calculated) : 75.3  Vital Signs: Temp: 98.4 F (36.9 C) (12/20 1140) Temp Source: Axillary (12/20 1140) BP: 133/72 (12/20 1140) Pulse Rate: 97 (12/20 1140)  Labs:  Recent Labs  11/29/16 1800 11/29/16 2151 11/30/16 0337 11/30/16 1008 12/01/16 0345  HGB 14.8  --  13.6  --  13.6  HCT 42.4  --  38.7*  --  38.4*  PLT 231  --  211  --  198  APTT 45*  --   --   --   --   LABPROT 27.8*  --   --  26.5* 28.9*  INR 2.54  --   --  2.39 2.67  CREATININE 1.34*  --  1.27*  --  1.50*  TROPONINI  --  <0.03 <0.03 0.03*  --     Estimated Creatinine Clearance (by C-G formula based on SCr of 1.5 mg/dL (H)) Male: 47.9 mL/min Male: 58.2 mL/min  Assessment:   65 yr old male on Coumadin for atrial fibrillation.   INR remains therapeutic (2.67) on home regimen of 1.5 mg daily. Some trend up.   Amiodarone added 12/19.  May effect Coumadin dosing over the next few days.  Goal of Therapy:  INR 2-3 Monitor platelets by anticoagulation protocol: Yes   Plan:   Continue Coumadin 1.5 mg daily for now.  Daily PT/INR.  Watch for Amiodarone effect on PT/INR.  [name] [name], [place] Pager: [phone] 12/01/2016,11:54 AM

## 2016-12-02 ENCOUNTER — Encounter (HOSPITAL_COMMUNITY): Payer: Self-pay | Admitting: General Practice

## 2016-12-02 DIAGNOSIS — I4891 Unspecified atrial fibrillation: Secondary | ICD-10-CM

## 2016-12-02 DIAGNOSIS — Z95810 Presence of automatic (implantable) cardiac defibrillator: Secondary | ICD-10-CM

## 2016-12-02 LAB — GLUCOSE, CAPILLARY
Glucose-Capillary: 204 mg/dL — ABNORMAL HIGH (ref 65–99)
Glucose-Capillary: 331 mg/dL — ABNORMAL HIGH (ref 65–99)
Glucose-Capillary: 288 mg/dL — ABNORMAL HIGH (ref 65–99)
Glucose-Capillary: 182 mg/dL — ABNORMAL HIGH (ref 65–99)

## 2016-12-02 LAB — PROTIME-INR
INR: 2.83
Prothrombin Time: 30.3 seconds — ABNORMAL HIGH (ref 11.4–15.2)

## 2016-12-02 MED ORDER — AMIODARONE HCL 200 MG PO TABS
200.0000 mg | ORAL_TABLET | Freq: Two times a day (BID) | ORAL | Status: DC
  Administered 2016-12-02 – 2016-12-03 (×3): 200 mg via ORAL
  Filled 2016-12-02 (×3): qty 1

## 2016-12-02 MED ORDER — INSULIN ASPART PROT & ASPART (70-30 MIX) 100 UNIT/ML ~~LOC~~ SUSP
30.0000 [IU] | Freq: Two times a day (BID) | SUBCUTANEOUS | Status: DC
  Administered 2016-12-02: 30 [IU] via SUBCUTANEOUS

## 2016-12-02 NOTE — Progress Notes (Signed)
[facility] PROGRESS NOTE  [name] [name] [place] JKD:[id number] DOB: [birth year] DOA: 11/29/2016  PCP: [name], PA  Brief History/Interval Summary: 65 year old Caucasian male with medical history significant of HTN, HLD, systolic CHF last EF 09-98% in 09/2016, CAD, MVP, s/p AICD/PM, and gerd; who presents with complaints of 2-3 days of feeling sick. Patient reports symptoms of feeling dizzy/ lightheaded with intermittent chest discomfort and palpitations. Patient was found to have atrial fibrillation with RVR. He was hospitalized for further management. He had similar admission back in October and was cardioverted at that time.  Reason for Visit: Atrial fibrillation  Consultants: Cardiology  Procedures: plan for TEE/DCCV in am.   Antibiotics: None  Subjective/Interval History: NO CHEST PAIN or sob, in good mood. Waiting for the TEE/ DCCV to be done as soon as possible.   ROS: Denies any nausea or vomiting, no chest pain.   Objective:  Vital Signs  Vitals:   12/02/16 0500 12/02/16 0752 12/02/16 1200 12/02/16 1300  BP: 105/84 (!) 128/99 106/85   Pulse: (!) 109     Resp: 20 18 18    Temp: 97.3 F (36.3 C) 97.5 F (36.4 C)  98.2 F (36.8 C)  TempSrc: Oral Oral  Oral  SpO2: 99%     Weight: 107.5 kg (237 lb 1.6 oz)     Height:        Intake/Output Summary (Last 24 hours) at 12/02/16 1509 Last data filed at 12/02/16 1200  Gross per 24 hour  Intake              642 ml  Output             1225 ml  Net             -583 ml   Filed Weights   11/30/16 0405 12/01/16 0420 12/02/16 0500  Weight: 103.9 kg (229 lb 1.6 oz) 102.4 kg (225 lb 11.2 oz) 107.5 kg (237 lb 1.6 oz)    General appearance: alert, cooperative, appears stated age and no distress Resp: Coarse breath sounds bilaterally.. No wheezing. Cardio: S1, S2 is irregularly irregular. No S3, S4. No rubs, murmurs, or bruit. No pedal edema. GI: soft, non-tender; bowel sounds normal; no masses,  no  organomegaly Scrotal examination revealed normal-sized testes. Some tenderness in the left scrotal area. No erythema. No masses appreciated. Extremities: extremities normal, atraumatic, no cyanosis or edema Neurologic: No focal deficits   Lab Results:  Data Reviewed: I have personally reviewed following labs and imaging studies  CBC:  Recent Labs Lab 11/29/16 1800 11/30/16 0337 12/01/16 0345  WBC 8.3 6.8 5.8  HGB 14.8 13.6 13.6  HCT 42.4 38.7* 38.4*  MCV 85.8 84.3 85.3  PLT 231 211 338    Basic Metabolic Panel:  Recent Labs Lab 11/29/16 1800 11/30/16 0337 12/01/16 0345  NA 136 136 136  K 4.1 3.3* 3.5  CL 104 105 104  CO2 23 22 23   GLUCOSE 273* 142* 261*  BUN 12 12 15   CREATININE 1.34* 1.27* 1.50*  CALCIUM 9.2 8.8* 8.4*    GFR: Estimated Creatinine Clearance (by C-G formula based on SCr of 1.5 mg/dL (H)) Male: 49.1 mL/min Male: 59.6 mL/min  Liver Function Tests:  Recent Labs Lab 11/29/16 1800  AST 22  ALT 17  ALKPHOS 69  BILITOT 0.8  PROT 6.3*  ALBUMIN 3.7    Coagulation Profile:  Recent Labs Lab 11/29/16 1800 11/30/16 1008 12/01/16 0345 12/02/16 0429  INR 2.54 2.39 2.67 2.83  Cardiac Enzymes:  Recent Labs Lab 11/29/16 2151 11/30/16 0337 11/30/16 1008  TROPONINI <0.03 <0.03 0.03*    CBG:  Recent Labs Lab 12/01/16 1155 12/01/16 1634 12/01/16 2106 12/02/16 0736 12/02/16 1116  GLUCAP 284* 281* 259* 204* 331*     Recent Results (from the past 240 hour(s))  MRSA PCR Screening     Status: None   Collection Time: 11/29/16  9:43 PM  Result Value Ref Range Status   MRSA by PCR NEGATIVE NEGATIVE Final    Comment:        The GeneXpert MRSA Assay (FDA approved for NASAL specimens only), is one component of a comprehensive MRSA colonization surveillance program. It is not intended to diagnose MRSA infection nor to guide or monitor treatment for MRSA infections.       Radiology Studies: No results  found.   Medications:  Scheduled: . amiodarone  200 mg Oral BID  . aspirin EC  81 mg Oral Daily  . atorvastatin  20 mg Oral q1800  . carvedilol  25 mg Oral BID WC  . FLUoxetine  40 mg Oral Daily  . insulin aspart  0-5 Units Subcutaneous QHS  . insulin aspart  0-9 Units Subcutaneous TID WC  . insulin aspart protamine- aspart  25 Units Subcutaneous BID WC  . losartan  50 mg Oral Daily  . pantoprazole  40 mg Oral Daily  . tamsulosin  0.4 mg Oral Daily  . warfarin  1.5 mg Oral q1800  . Warfarin - Pharmacist Dosing Inpatient   Does not apply q1800   Continuous:  OZH:[id number] **OR** acetaminophen, albuterol, nitroGLYCERIN, ondansetron **OR** ondansetron (ZOFRAN) IV, traZODone  Assessment/Plan:  Principal Problem:   Atrial fibrillation ([facility]) Active Problems:   Hypertension   Hyperlipidemia   GERD (gastroesophageal reflux disease)   Automatic implantable cardioverter-defibrillator in situ   CAD (coronary artery disease)   Chronic systolic heart failure ([facility])   Atrial fibrillation with RVR ([facility])    Atrial fibrillation With RVR Initially on cardizem gtt, changed to amiodarone gtt. Rate is better today. Cardiology consulted and plan for TEE tomorrow.  Patient CHADsVASc score = 4. Continue warfarin. Therapeutic INR.   Acute on chronic systolic CHF exacerbation Patient's BNP was elevated similar to previous admission. His x-ray showing cardiomegaly with congestion. Last echocardiogram showing EF of 20-25% by TEEin 09/2016. Improving.   Diabetes mellitus type 2:  CBG (last 3)   Recent Labs  12/01/16 2106 12/02/16 0736 12/02/16 1116  GLUCAP 259* 204* 331*    cbg's slightly elevated, will increase novolog 70/30 to 30  Units bid  and resume SSI.    Scrotal pain Patient underwent ultrasound of his scrotum in October. No masses were noted. Evidence for bilateral varicoceles. Patient was reassured. He will need outpatient appointment with urology.  CAD Stable.  Continue aspirin  AICD in place Stable  Essential hypertension Well controlled.  Continue home medications.  Hyperlipidemia Continue atorvastatin  Anxiety  Continue Prozac  GERD Continue PPI.   DVT Prophylaxis: On warfarin    Code Status: Full code  Family Communication: none at bedside Disposition Plan: Management per cardiology.TEE in the next 48 hours.    LOS: 3 days   [place] [facility] Pager [phone]  12/02/2016, 3:09 PM  If 7PM-7AM, please contact night-coverage at [email or web address], password [facility]

## 2016-12-02 NOTE — Progress Notes (Signed)
ANTICOAGULATION CONSULT NOTE - Follow Up Consult  Pharmacy Consult for Coumadin Indication: atrial fibrillation  Allergies  Allergen Reactions  . Metformin And Related Nausea Only and Other (See Comments)    Loss of appetite  . Lisinopril Cough    Patient Measurements: Height: 5\' 11"  (180.3 cm) Weight: 237 lb 1.6 oz (107.5 kg) IBW/kg (Calculated) : 75.3  Vital Signs: Temp: 98.2 F (36.8 C) (12/21 1300) Temp Source: Oral (12/21 1300) BP: 106/85 (12/21 1200) Pulse Rate: 109 (12/21 0500)  Labs:  Recent Labs  11/29/16 1800 11/29/16 2151 11/30/16 0337 11/30/16 1008 12/01/16 0345 12/02/16 0429  HGB 14.8  --  13.6  --  13.6  --   HCT 42.4  --  38.7*  --  38.4*  --   PLT 231  --  211  --  198  --   APTT 45*  --   --   --   --   --   LABPROT 27.8*  --   --  26.5* 28.9* 30.3*  INR 2.54  --   --  2.39 2.67 2.83  CREATININE 1.34*  --  1.27*  --  1.50*  --   TROPONINI  --  <0.03 <0.03 0.03*  --   --     Estimated Creatinine Clearance (by C-G formula based on SCr of 1.5 mg/dL (H)) Male: 49.1 mL/min Male: 59.6 mL/min  Assessment:   65 yr old male on Coumadin for atrial fibrillation.   INR remains therapeutic (2.83) on home regimen of 1.5 mg daily. Some trend up since Amiodarone added 12/19, but remains in target range. INR may still trend up over the next few days.  Patient reports weekly INR checks on Mondays, as outpatient.  Goal of Therapy:  INR 2-3 Monitor platelets by anticoagulation protocol: Yes   Plan:   Continue Coumadin 1.5 mg daily for now, home regimen.  Daily PT/INR while in the hospital.  Watch for Amiodarone effect on PT/INR.  [name], [place] Pager: [phone] 12/02/2016,3:25 PM

## 2016-12-02 NOTE — Progress Notes (Signed)
Subjective:  Clinically improved. Remains in AFIB with CVR on IV Amio, INR therapeutic, For TEE DCCV tomorrow then home  Objective:  Temp:  [97.3 F (36.3 C)-98.4 F (36.9 C)] 97.5 F (36.4 C) (12/21 0752) Pulse Rate:  [66-109] 109 (12/21 0500) Resp:  [11-23] 18 (12/21 0752) BP: (92-133)/(61-99) 128/99 (12/21 0752) SpO2:  [97 %-99 %] 99 % (12/21 0500) Weight:  [237 lb 1.6 oz (107.5 kg)] 237 lb 1.6 oz (107.5 kg) (12/21 0500) Weight change: 11 lb 6.4 oz (5.171 kg)  Intake/Output from previous day: 12/20 0701 - 12/21 0700 In: 600 [P.O.:600] Out: 725 [Urine:725]  Intake/Output from this shift: Total I/O In: 240 [P.O.:240] Out: 100 [Urine:100]  Physical Exam: General appearance: alert and no distress Neck: no adenopathy, no carotid bruit, no JVD, supple, symmetrical, trachea midline and thyroid not enlarged, symmetric, no tenderness/mass/nodules Lungs: clear to auscultation bilaterally Heart: irregularly irregular rhythm Extremities: extremities normal, atraumatic, no cyanosis or edema  Lab Results: Results for orders placed or performed during the hospital encounter of 11/29/16 (from the past 48 hour(s))  Troponin I     Status: Abnormal   Collection Time: 11/30/16 10:08 AM  Result Value Ref Range   Troponin I 0.03 (HH) <0.03 ng/mL    Comment: CRITICAL RESULT CALLED TO, READ BACK BY AND VERIFIED WITH: J.LAUER,RN 11/30/16 1132 BY BSLADE   Protime-INR     Status: Abnormal   Collection Time: 11/30/16 10:08 AM  Result Value Ref Range   Prothrombin Time 26.5 (H) 11.4 - 15.2 seconds   INR 2.39   Glucose, capillary     Status: Abnormal   Collection Time: 11/30/16 11:27 AM  Result Value Ref Range   Glucose-Capillary 168 (H) 65 - 99 mg/dL   Comment 1 Document in Chart   Glucose, capillary     Status: Abnormal   Collection Time: 11/30/16 12:14 PM  Result Value Ref Range   Glucose-Capillary 173 (H) 65 - 99 mg/dL   Comment 1 Document in Chart   Glucose, capillary      Status: Abnormal   Collection Time: 11/30/16  4:58 PM  Result Value Ref Range   Glucose-Capillary 278 (H) 65 - 99 mg/dL  Glucose, capillary     Status: Abnormal   Collection Time: 11/30/16  9:15 PM  Result Value Ref Range   Glucose-Capillary 217 (H) 65 - 99 mg/dL  Protime-INR     Status: Abnormal   Collection Time: 12/01/16  3:45 AM  Result Value Ref Range   Prothrombin Time 28.9 (H) 11.4 - 15.2 seconds   INR 2.67   CBC     Status: Abnormal   Collection Time: 12/01/16  3:45 AM  Result Value Ref Range   WBC 5.8 4.0 - 10.5 K/uL   RBC 4.50 4.22 - 5.81 MIL/uL   Hemoglobin 13.6 13.0 - 17.0 g/dL   HCT 38.4 (L) 39.0 - 52.0 %   MCV 85.3 78.0 - 100.0 fL   MCH 30.2 26.0 - 34.0 pg   MCHC 35.4 30.0 - 36.0 g/dL   RDW 12.9 11.5 - 15.5 %   Platelets 198 150 - 400 K/uL  Basic metabolic panel     Status: Abnormal   Collection Time: 12/01/16  3:45 AM  Result Value Ref Range   Sodium 136 135 - 145 mmol/L   Potassium 3.5 3.5 - 5.1 mmol/L   Chloride 104 101 - 111 mmol/L   CO2 23 22 - 32 mmol/L   Glucose, Bld 261 (H)  65 - 99 mg/dL   BUN 15 6 - 20 mg/dL   Creatinine, Ser 1.50 (H) 0.61 - 1.24 mg/dL   Calcium 8.4 (L) 8.9 - 10.3 mg/dL   GFR calc non Af Amer 46 (L) >60 mL/min   GFR calc Af Amer 54 (L) >60 mL/min    Comment: (NOTE) The eGFR has been calculated using the CKD EPI equation. This calculation has not been validated in all clinical situations. eGFR's persistently <60 mL/min signify possible Chronic Kidney Disease.    Anion gap 9 5 - 15  Glucose, capillary     Status: Abnormal   Collection Time: 12/01/16  7:29 AM  Result Value Ref Range   Glucose-Capillary 319 (H) 65 - 99 mg/dL  Glucose, capillary     Status: Abnormal   Collection Time: 12/01/16 11:55 AM  Result Value Ref Range   Glucose-Capillary 284 (H) 65 - 99 mg/dL  Glucose, capillary     Status: Abnormal   Collection Time: 12/01/16  4:34 PM  Result Value Ref Range   Glucose-Capillary 281 (H) 65 - 99 mg/dL  Glucose,  capillary     Status: Abnormal   Collection Time: 12/01/16  9:06 PM  Result Value Ref Range   Glucose-Capillary 259 (H) 65 - 99 mg/dL  Protime-INR     Status: Abnormal   Collection Time: 12/02/16  4:29 AM  Result Value Ref Range   Prothrombin Time 30.3 (H) 11.4 - 15.2 seconds   INR 2.83   Glucose, capillary     Status: Abnormal   Collection Time: 12/02/16  7:36 AM  Result Value Ref Range   Glucose-Capillary 204 (H) 65 - 99 mg/dL    Imaging: Imaging results have been reviewed  Tele- AFIB with CVR  Assessment/Plan:   1. Principal Problem: 2.   Atrial fibrillation ([place]) 3. Active Problems: 4.   Hypertension 5.   Hyperlipidemia 6.   GERD (gastroesophageal reflux disease) 7.   Automatic implantable cardioverter-defibrillator in situ 8.   CAD (coronary artery disease) 9.   Chronic systolic heart failure ([place]) 10.   Atrial fibrillation with RVR ([place]) 11.   Time Spent Directly with Patient:  20 minutes  Length of Stay:  LOS: 3 days   Pt remains in AFIB with CVR on IV amio. Clinically improved. Exam benign. INR therapeutic. Will transition to PO Amio load (400  Mg PO BID). Plan TEE DCCV tomorrow at 10 AM. Can probably be [facility] home after that  [name] [name] 12/02/2016, 9:59 AM

## 2016-12-02 NOTE — Progress Notes (Signed)
[facility] has been requested to perform a transesophageal echocardiogram on 12/03/16 for atrial fibrillation .  After careful review of history and examination, the risks and benefits of transesophageal echocardiogram have been explained including risks of esophageal damage, perforation (1:10,000 risk), bleeding, pharyngeal hematoma as well as other potential complications associated with conscious sedation including aspiration, arrhythmia, respiratory failure and death. Alternatives to treatment were discussed, questions were answered. Patient is willing to proceed.  TEE - Dr. Meda Coffee  @ 10:00 . NPO after midnight. Meds with sips.   Arbutus Leas, NP 12/02/2016 4:50 PM

## 2016-12-03 ENCOUNTER — Encounter (HOSPITAL_COMMUNITY)
Admission: EM | Disposition: A | Discharge status: Home / Self Care | Payer: Self-pay | Source: Home / Self Care | Attending: Internal Medicine

## 2016-12-03 ENCOUNTER — Other Ambulatory Visit: Payer: Self-pay | Admitting: *Deleted

## 2016-12-03 ENCOUNTER — Inpatient Hospital Stay (HOSPITAL_COMMUNITY): Payer: Commercial Managed Care - HMO

## 2016-12-03 ENCOUNTER — Inpatient Hospital Stay (HOSPITAL_COMMUNITY): Payer: Commercial Managed Care - HMO | Admitting: Anesthesiology

## 2016-12-03 ENCOUNTER — Encounter (HOSPITAL_COMMUNITY): Payer: Self-pay | Admitting: *Deleted

## 2016-12-03 ENCOUNTER — Telehealth: Payer: Self-pay | Admitting: Internal Medicine

## 2016-12-03 DIAGNOSIS — I340 Nonrheumatic mitral (valve) insufficiency: Secondary | ICD-10-CM

## 2016-12-03 DIAGNOSIS — I4892 Unspecified atrial flutter: Secondary | ICD-10-CM

## 2016-12-03 HISTORY — PX: CARDIOVERSION: SHX1299

## 2016-12-03 HISTORY — PX: TEE WITHOUT CARDIOVERSION: SHX5443

## 2016-12-03 LAB — GLUCOSE, CAPILLARY
Glucose-Capillary: 207 mg/dL — ABNORMAL HIGH (ref 65–99)
Glucose-Capillary: 133 mg/dL — ABNORMAL HIGH (ref 65–99)

## 2016-12-03 LAB — BASIC METABOLIC PANEL
Anion gap: 7 (ref 5–15)
BUN: 14 mg/dL (ref 6–20)
CHLORIDE: 104 mmol/L (ref 101–111)
CO2: 29 mmol/L (ref 22–32)
CREATININE: 1.52 mg/dL — AB (ref 0.61–1.24)
Calcium: 9.3 mg/dL (ref 8.9–10.3)
GFR calc non Af Amer: 46 mL/min — ABNORMAL LOW (ref >60)
GFR, EST AFRICAN AMERICAN: 53 mL/min — AB (ref >60)
Glucose, Bld: 168 mg/dL — ABNORMAL HIGH (ref 65–99)
POTASSIUM: 4.5 mmol/L (ref 3.5–5.1)
Sodium: 140 mmol/L (ref 135–145)

## 2016-12-03 LAB — CBC
HEMATOCRIT: 39.4 % (ref 39.0–52.0)
HEMOGLOBIN: 13.6 g/dL (ref 13.0–17.0)
MCH: 30.1 pg (ref 26.0–34.0)
MCHC: 34.5 g/dL (ref 30.0–36.0)
MCV: 87.2 fL (ref 78.0–100.0)
Platelets: 215 10*3/uL (ref 150–400)
RBC: 4.52 MIL/uL (ref 4.22–5.81)
RDW: 12.9 % (ref 11.5–15.5)
WBC: 6.3 10*3/uL (ref 4.0–10.5)

## 2016-12-03 LAB — PROTIME-INR
INR: 2.75
Prothrombin Time: 29.6 seconds — ABNORMAL HIGH (ref 11.4–15.2)

## 2016-12-03 SURGERY — ECHOCARDIOGRAM, TRANSESOPHAGEAL
Anesthesia: General

## 2016-12-03 MED ORDER — AMIODARONE HCL 200 MG PO TABS: 200.0000 mg | ORAL_TABLET | Freq: Two times a day (BID) | ORAL | 0 refills | Status: DC

## 2016-12-03 MED ORDER — LACTATED RINGERS IV SOLN
INTRAVENOUS | Status: DC
  Administered 2016-12-03: 08:00:00 via INTRAVENOUS

## 2016-12-03 MED ORDER — LIDOCAINE HCL (CARDIAC) 20 MG/ML IV SOLN
INTRAVENOUS | Status: DC | PRN
  Administered 2016-12-03: 20 mg via INTRATRACHEAL

## 2016-12-03 MED ORDER — PROPOFOL 10 MG/ML IV BOLUS
INTRAVENOUS | Status: DC | PRN
  Administered 2016-12-03 (×8): 20 mg via INTRAVENOUS

## 2016-12-03 MED ORDER — SODIUM CHLORIDE 0.9 % IV SOLN: INTRAVENOUS | Status: DC

## 2016-12-03 MED ORDER — LACTATED RINGERS IV SOLN
INTRAVENOUS | Status: DC | PRN
  Administered 2016-12-03: 09:00:00 via INTRAVENOUS

## 2016-12-03 NOTE — Patient Outreach (Signed)
[facility]) Care Management  12/03/2016  [name] [name] 18-Jun-1951 [id number]   CSW spoke with patient's wife via phone on 12/02/16 who reports patient is still in hospital with hope to be released tomorrow.  CSW explained role and reason for consult to CSW to assist with financial concerns. CSW completed Financial assessment and will investigate support/services for patient and wife.  Patient's wife asked that I follow up with them next week to discuss resources and assess further.  Eduard Clos, MSW, [place] Worker  [place] [phone]

## 2016-12-03 NOTE — Progress Notes (Signed)
Subjective:  Successful TEE-DC CV to normal sinus rhythm by Dr. Meda Coffee. He feels clinically improved.  Objective:  Temp:  [97.4 F (36.3 C)-98.8 F (37.1 C)] 97.6 F (36.4 C) (12/22 1121) Pulse Rate:  [56-119] 80 (12/22 1121) Resp:  [12-20] 14 (12/22 1121) BP: (102-152)/(69-107) 134/99 (12/22 1121) SpO2:  [95 %-100 %] 100 % (12/22 1121) Weight:  [232 lb 3.2 oz (105.3 kg)] 232 lb 3.2 oz (105.3 kg) (12/22 0500) Weight change: -4 lb 14.4 oz (-2.223 kg)  Intake/Output from previous day: 12/21 0701 - 12/22 0700 In: 902 [P.O.:902] Out: 2653 [Urine:2650; Stool:3]  Intake/Output from this shift: Total I/O In: 300 [I.V.:300] Out: -   Physical Exam: General appearance: alert and no distress Neck: no adenopathy, no carotid bruit, no JVD, supple, symmetrical, trachea midline and thyroid not enlarged, symmetric, no tenderness/mass/nodules Lungs: clear to auscultation bilaterally Heart: regular rate and rhythm, S1, S2 normal, no murmur, click, rub or gallop Extremities: extremities normal, atraumatic, no cyanosis or edema  Lab Results: Results for orders placed or performed during the hospital encounter of 11/29/16 (from the past 48 hour(s))  Glucose, capillary     Status: Abnormal   Collection Time: 12/01/16  4:34 PM  Result Value Ref Range   Glucose-Capillary 281 (H) 65 - 99 mg/dL  Glucose, capillary     Status: Abnormal   Collection Time: 12/01/16  9:06 PM  Result Value Ref Range   Glucose-Capillary 259 (H) 65 - 99 mg/dL  Protime-INR     Status: Abnormal   Collection Time: 12/02/16  4:29 AM  Result Value Ref Range   Prothrombin Time 30.3 (H) 11.4 - 15.2 seconds   INR 2.83   Glucose, capillary     Status: Abnormal   Collection Time: 12/02/16  7:36 AM  Result Value Ref Range   Glucose-Capillary 204 (H) 65 - 99 mg/dL  Glucose, capillary     Status: Abnormal   Collection Time: 12/02/16 11:16 AM  Result Value Ref Range   Glucose-Capillary 331 (H) 65 - 99 mg/dL    Glucose, capillary     Status: Abnormal   Collection Time: 12/02/16  4:43 PM  Result Value Ref Range   Glucose-Capillary 288 (H) 65 - 99 mg/dL  Glucose, capillary     Status: Abnormal   Collection Time: 12/02/16  9:45 PM  Result Value Ref Range   Glucose-Capillary 182 (H) 65 - 99 mg/dL  Protime-INR     Status: Abnormal   Collection Time: 12/03/16  5:08 AM  Result Value Ref Range   Prothrombin Time 29.6 (H) 11.4 - 15.2 seconds   INR 2.75   CBC     Status: None   Collection Time: 12/03/16  5:08 AM  Result Value Ref Range   WBC 6.3 4.0 - 10.5 K/uL   RBC 4.52 4.22 - 5.81 MIL/uL   Hemoglobin 13.6 13.0 - 17.0 g/dL   HCT 39.4 39.0 - 52.0 %   MCV 87.2 78.0 - 100.0 fL   MCH 30.1 26.0 - 34.0 pg   MCHC 34.5 30.0 - 36.0 g/dL   RDW 12.9 11.5 - 15.5 %   Platelets 215 150 - 400 K/uL  Basic metabolic panel     Status: Abnormal   Collection Time: 12/03/16  5:08 AM  Result Value Ref Range   Sodium 140 135 - 145 mmol/L   Potassium 4.5 3.5 - 5.1 mmol/L   Chloride 104 101 - 111 mmol/L   CO2 29 22 - 32 mmol/L  Glucose, Bld 168 (H) 65 - 99 mg/dL   BUN 14 6 - 20 mg/dL   Creatinine, Ser 1.52 (H) 0.61 - 1.24 mg/dL   Calcium 9.3 8.9 - 10.3 mg/dL   GFR calc non Af Amer 46 (L) >60 mL/min   GFR calc Af Amer 53 (L) >60 mL/min    Comment: (NOTE) The eGFR has been calculated using the CKD EPI equation. This calculation has not been validated in all clinical situations. eGFR's persistently <60 mL/min signify possible Chronic Kidney Disease.    Anion gap 7 5 - 15  Glucose, capillary     Status: Abnormal   Collection Time: 12/03/16  7:28 AM  Result Value Ref Range   Glucose-Capillary 207 (H) 65 - 99 mg/dL  Glucose, capillary     Status: Abnormal   Collection Time: 12/03/16 11:21 AM  Result Value Ref Range   Glucose-Capillary 133 (H) 65 - 99 mg/dL    Imaging: Imaging results have been reviewed  Telemetry-normal sinus rhythm  Assessment/Plan:   1. Principal Problem: 2.   Atrial  fibrillation ([place]) 3. Active Problems: 4.   Hypertension 5.   Hyperlipidemia 6.   GERD (gastroesophageal reflux disease) 7.   Automatic implantable cardioverter-defibrillator in situ 8.   CAD (coronary artery disease) 9.   Chronic systolic heart failure ([place]) 10.   Atrial fibrillation with RVR ([place]) 11.   Time Spent Directly with Patient:  20 minutes  Length of Stay:  LOS: 4 days   Successful transesophageal echo and DC cardioversion by Dr. Meda Coffee this morning he currently is in normal sinus rhythm. His INR is therapeutic. He feels clinically improved. He is stable for discharge home later this afternoon. We will arrange follow-up.  Quay Burow 12/03/2016, 12:36 PM

## 2016-12-03 NOTE — Interval H&P Note (Signed)
History and Physical Interval Note:  12/03/2016 8:54 AM  [name] [name]  has presented today for surgery, with the diagnosis of AFIB  The various methods of treatment have been discussed with the patient and family. After consideration of risks, benefits and other options for treatment, the patient has consented to  Procedure(s): TRANSESOPHAGEAL ECHOCARDIOGRAM (TEE) (N/A) CARDIOVERSION (N/A) as a surgical intervention .  The patient's history has been reviewed, patient examined, no change in status, stable for surgery.  I have reviewed the patient's chart and labs.  Questions were answered to the patient's satisfaction.     [name]

## 2016-12-03 NOTE — Anesthesia Postprocedure Evaluation (Signed)
Anesthesia Post Note  Patient: [name] [name]  Procedure(s) Performed: Procedure(s) (LRB): TRANSESOPHAGEAL ECHOCARDIOGRAM (TEE) (N/A) CARDIOVERSION (N/A)  Patient location during evaluation: Endoscopy Anesthesia Type: General Level of consciousness: sedated and patient cooperative Pain management: pain level controlled Vital Signs Assessment: post-procedure vital signs reviewed and stable Respiratory status: spontaneous breathing Cardiovascular status: stable Anesthetic complications: no       Last Vitals:  Vitals:   12/03/16 1015 12/03/16 1121  BP: 120/73 (!) 134/99  Pulse: 78 80  Resp: 16 14  Temp:  36.4 C    Last Pain:  Vitals:   12/03/16 1121  TempSrc: Oral  PainSc:                  Nolon Nations

## 2016-12-03 NOTE — H&P (View-Only) (Signed)
[facility] has been requested to perform a transesophageal echocardiogram on 12/03/16 for atrial fibrillation .  After careful review of history and examination, the risks and benefits of transesophageal echocardiogram have been explained including risks of esophageal damage, perforation (1:10,000 risk), bleeding, pharyngeal hematoma as well as other potential complications associated with conscious sedation including aspiration, arrhythmia, respiratory failure and death. Alternatives to treatment were discussed, questions were answered. Patient is willing to proceed.  TEE - Dr. Meda Coffee  @ 10:00 . NPO after midnight. Meds with sips.   Arbutus Leas, NP 12/02/2016 4:50 PM

## 2016-12-03 NOTE — Progress Notes (Signed)
  Echocardiogram Echocardiogram Transesophageal has been performed.  [name] [name] 12/03/2016, 10:08 AM

## 2016-12-03 NOTE — CV Procedure (Signed)
    Transesophageal Echocardiogram Note  [name] [name] [id number] [birth year]  Procedure: Transesophageal Echocardiogram Indications: atrial flutter  Procedure Details Consent: Obtained Time Out: Verified patient identification, verified procedure, site/side was marked, verified correct patient position, special equipment/implants available, Radiology Safety Procedures followed,  medications/allergies/relevent history reviewed, required imaging and test results available.  Performed  Medications: Lidocaine: 20 mg Propofol 160 mg  No intracardiac thrombus, for full dictation please see TEE report.   Complications: No apparent complications Patient did tolerate procedure well.  [name], MD, [facility] 12/03/2016, 9:55 AM   Cardioversion Note  [name] [name] [id number] 1951-07-17  Procedure: DC Cardioversion Indications: atrial flutter  Procedure Details Consent: Obtained Time Out: Verified patient identification, verified procedure, site/side was marked, verified correct patient position, special equipment/implants available, Radiology Safety Procedures followed,  medications/allergies/relevent history reviewed, required imaging and test results available.  Performed  The patient has been on adequate anticoagulation.  The patient received IV propofol and lidocaine as above for sedation.  Synchronous cardioversion was performed at 120 joules.  The cardioversion was successful PM interrogated by [facility] rep.   Complications: No apparent complications Patient did tolerate procedure well.   [name], MD, [facility] 12/03/2016, 9:55 AM

## 2016-12-03 NOTE — Telephone Encounter (Signed)
TCM fu appt, appt with Tommye Standard 12-29 at 930a per Jettie Booze PA

## 2016-12-03 NOTE — Transfer of Care (Signed)
Immediate Anesthesia Transfer of Care Note  Patient: [name] [name]  Procedure(s) Performed: Procedure(s): TRANSESOPHAGEAL ECHOCARDIOGRAM (TEE) (N/A) CARDIOVERSION (N/A)  Patient Location: PACU  Anesthesia Type:MAC  Level of Consciousness: awake, alert  and oriented  Airway & Oxygen Therapy: Patient Spontanous Breathing and Patient connected to nasal cannula oxygen  Post-op Assessment: Report given to RN and Post -op Vital signs reviewed and stable  Post vital signs: Reviewed and stable  Last Vitals:  Vitals:   12/03/16 0807 12/03/16 1002  BP: (!) 151/107 109/69  Pulse: (!) 117 73  Resp: 18 12  Temp: 36.3 C     Last Pain:  Vitals:   12/03/16 0807  TempSrc: Oral  PainSc:       Patients Stated Pain Goal: 0 (81/18/86 7737)  Complications: No apparent anesthesia complications

## 2016-12-03 NOTE — Discharge Summary (Signed)
Physician Discharge Summary  [name] [name] FTD:[id number] DOB: Aug 03, 1951 DOA: 11/29/2016  PCP: [name], PA  Admit date: 11/29/2016 Discharge date: 12/03/2016  Admitted From: Home.  Disposition:  Home.   Recommendations for Outpatient Follow-up:  1. Follow up with PCP in 1-2 weeks 2. Please obtain BMP/CBC in one week 3. Please follow up with cardiology next week as recommended.  4. Please follow up with urology as outpatient.     Discharge Condition:stable.  CODE STATUS:full code.  Diet recommendation: Heart Healthy  Brief/Interim Summary: 65 year old Caucasian male with medical history significant of HTN, HLD, systolicCHF last EF 06-23% in 09/2016, CAD, MVP, s/p AICD/PM, and gerd; who presents with complaints of 2-3 days of feeling sick. Patient reports symptoms of feeling dizzy/lightheaded with intermittent chest discomfort and palpitations. Patient was found to have atrial fibrillation with RVR. He was hospitalized for further management. He had similar admission back in October and was cardioverted at that time  Discharge Diagnoses:  Principal Problem:   Atrial fibrillation ([place]) Active Problems:   Hypertension   Hyperlipidemia   GERD (gastroesophageal reflux disease)   Automatic implantable cardioverter-defibrillator in situ   CAD (coronary artery disease)   Chronic systolic heart failure ([facility])   Atrial fibrillation with RVR ([facility])  Atrial fibrillation With RVR Initially on cardizem gtt, changed to amiodarone gtt, to amiodarone 200 mg twice daily.  Rate is better today. Cardiology consulted , underwent TEE/DC CV today after which he is in sinus rhythm.  Patient CHADsVASc score = 4. Continue warfarin. Therapeutic INR.   Acute on chronic systolic CHF exacerbation Patient's BNP was elevated similar to previous admission. His x-ray showing cardiomegaly with congestion. Last echocardiogram showing EF of 20-25% by TEEin 09/2016. Improving.   Diabetes  mellitus type 2:  CBG (last 3)   Recent Labs (last 2 labs)    Recent Labs  12/01/16 2106 12/02/16 0736 12/02/16 1116  GLUCAP 259* 204* 331*      Resume novolog 70/30 to 35 units bid on discharge, along with sitagliptin.  Last A1c is 12. Recommend outpatient follow up.    Scrotal pain Patient underwent ultrasound of his scrotum in October. No masses were noted. Evidence for bilateral varicoceles. Patient was reassured. He will need outpatient appointment with urology.  CAD Stable. Continue aspirin  AICD in place Stable  Essential hypertension Well controlled.  Continue home medications.  Hyperlipidemia Continue atorvastatin  Anxiety  Continue Prozac  GERD Continue PPI.    Discharge Instructions  Discharge Instructions    Diet - low sodium heart healthy    Complete by:  As directed    Discharge instructions    Complete by:  As directed    Please follow up with cardiology as recommended.     Allergies as of 12/03/2016      Reactions   Metformin And Related Nausea Only, Other (See Comments)   Loss of appetite   Lisinopril Cough      Medication List    TAKE these medications   amiodarone 200 MG tablet Commonly known as:  PACERONE Take 1 tablet (200 mg total) by mouth 2 (two) times daily.   aspirin EC 81 MG tablet Take 81 mg by mouth daily.   atorvastatin 20 MG tablet Commonly known as:  LIPITOR Take 20 mg by mouth daily at 6 PM.   carvedilol 25 MG tablet Commonly known as:  COREG Take 1 tablet (25 mg total) by mouth 2 (two) times daily with a meal.   FLUoxetine 40 MG  capsule Commonly known as:  PROZAC Take 40 mg by mouth daily.   insulin aspart protamine- aspart (70-30) 100 UNIT/ML injection Commonly known as:  NOVOLOG MIX 70/30 Inject 35 Units into the skin 2 (two) times daily with a meal.   nitroGLYCERIN 0.4 MG SL tablet Commonly known as:  NITROSTAT Place 1 tablet (0.4 mg total) under the tongue every 5 (five) minutes x  3 doses as needed for chest pain.   omeprazole 40 MG capsule Commonly known as:  PRILOSEC Take 40 mg by mouth daily.   sitaGLIPtin 100 MG tablet Commonly known as:  JANUVIA Take 100 mg by mouth daily.   tamsulosin 0.4 MG Caps capsule Commonly known as:  FLOMAX Take 1 capsule (0.4 mg total) by mouth daily.   traZODone 100 MG tablet Commonly known as:  DESYREL Take 100 mg by mouth at bedtime as needed for sleep.   valsartan 80 MG tablet Commonly known as:  DIOVAN Take 1 tablet (80 mg total) by mouth daily.   warfarin 1 MG tablet Commonly known as:  COUMADIN Take 1 tablet (1 mg total) by mouth one time only at 6 PM. What changed:  how much to take  when to take this      Follow-up Information    Baldwin Jamaica, PA-C Follow up on 12/10/2016.   Specialty:  Cardiology Why:  at 9:30am for hospital follow up Contact information: [place] [address] [phone]        [name], [place]. Schedule an appointment as soon as possible for a visit in 1 week(s).   Specialty:  Physician Assistant Contact information: [place] [address] [phone]          Allergies  Allergen Reactions  . Metformin And Related Nausea Only and Other (See Comments)    Loss of appetite  . Lisinopril Cough    Consultations:  Cardiology.   Procedures/Studies: Dg Chest Port 1 View  Result Date: 11/29/2016 CLINICAL DATA:  weakness today. Pt is SOB when walking. Nonsmoker. Hx of diabetes, hypertension, afib, CHF, ischemic cardiomyopathy, mitral valve prolapse. Heart catheterization (2013, 2016). Defibrillator placement(2007), change in 2014.Pt had open heart surgery for valve repair in 2004. EXAM: PORTABLE CHEST - 1 VIEW COMPARISON:  09/27/2016 FINDINGS: Lungs are clear. Heart size normal. Previous median sternotomy. Left subclavian AICD stable. No effusion. IMPRESSION: No acute disease.  Stable postop changes. Electronically Signed    By: Lucrezia Europe M.D.   On: 11/29/2016 17:52      Subjective: No new complaints. Looking forward to going home.   Discharge Exam: Vitals:   12/03/16 1015 12/03/16 1121  BP: 120/73 (!) 134/99  Pulse: 78 80  Resp: 16 14  Temp:  97.6 F (36.4 C)   Vitals:   12/03/16 0807 12/03/16 1002 12/03/16 1015 12/03/16 1121  BP: (!) 151/107 109/69 120/73 (!) 134/99  Pulse: (!) 117 73 78 80  Resp: 18 12 16 14   Temp: 97.4 F (36.3 C)   97.6 F (36.4 C)  TempSrc: Oral   Oral  SpO2: 96% 99% 99% 100%  Weight:      Height:        General: Pt is alert, awake, not in acute distress Cardiovascular: RRR, S1/S2 +, no rubs, no gallops Respiratory: CTA bilaterally, no wheezing, no rhonchi Abdominal: Soft, NT, ND, bowel sounds + Extremities: no edema, no cyanosis    The results of significant diagnostics from this hospitalization (including imaging, microbiology,  ancillary and laboratory) are listed below for reference.     Microbiology: Recent Results (from the past 240 hour(s))  MRSA PCR Screening     Status: None   Collection Time: 11/29/16  9:43 PM  Result Value Ref Range Status   MRSA by PCR NEGATIVE NEGATIVE Final    Comment:        The GeneXpert MRSA Assay (FDA approved for NASAL specimens only), is one component of a comprehensive MRSA colonization surveillance program. It is not intended to diagnose MRSA infection nor to guide or monitor treatment for MRSA infections.      Labs: BNP (last 3 results)  Recent Labs  09/27/16 1331 11/29/16 1800  BNP 284.3* 563.8*   Basic Metabolic Panel:  Recent Labs Lab 11/29/16 1800 11/30/16 0337 12/01/16 0345 12/03/16 0508  NA 136 136 136 140  K 4.1 3.3* 3.5 4.5  CL 104 105 104 104  CO2 23 22 23 29   GLUCOSE 273* 142* 261* 168*  BUN 12 12 15 14   CREATININE 1.34* 1.27* 1.50* 1.52*  CALCIUM 9.2 8.8* 8.4* 9.3   Liver Function Tests:  Recent Labs Lab 11/29/16 1800  AST 22  ALT 17  ALKPHOS 69  BILITOT 0.8  PROT 6.3*   ALBUMIN 3.7   No results for input(s): LIPASE, AMYLASE in the last 168 hours. No results for input(s): AMMONIA in the last 168 hours. CBC:  Recent Labs Lab 11/29/16 1800 11/30/16 0337 12/01/16 0345 12/03/16 0508  WBC 8.3 6.8 5.8 6.3  HGB 14.8 13.6 13.6 13.6  HCT 42.4 38.7* 38.4* 39.4  MCV 85.8 84.3 85.3 87.2  PLT 231 211 198 215   Cardiac Enzymes:  Recent Labs Lab 11/29/16 2151 11/30/16 0337 11/30/16 1008  TROPONINI <0.03 <0.03 0.03*   BNP: Invalid input(s): POCBNP CBG:  Recent Labs Lab 12/02/16 1116 12/02/16 1643 12/02/16 2145 12/03/16 0728 12/03/16 1121  GLUCAP 331* 288* 182* 207* 133*   D-Dimer No results for input(s): DDIMER in the last 72 hours. Hgb A1c No results for input(s): HGBA1C in the last 72 hours. Lipid Profile No results for input(s): CHOL, HDL, LDLCALC, TRIG, CHOLHDL, LDLDIRECT in the last 72 hours. Thyroid function studies No results for input(s): TSH, T4TOTAL, T3FREE, THYROIDAB in the last 72 hours.  Invalid input(s): FREET3 Anemia work up No results for input(s): VITAMINB12, FOLATE, FERRITIN, TIBC, IRON, RETICCTPCT in the last 72 hours. Urinalysis    Component Value Date/Time   COLORURINE YELLOW 11/29/2016 1810   APPEARANCEUR CLEAR 11/29/2016 1810   LABSPEC 1.030 11/29/2016 1810   PHURINE 5.0 11/29/2016 1810   GLUCOSEU >=500 (A) 11/29/2016 1810   HGBUR NEGATIVE 11/29/2016 1810   BILIRUBINUR NEGATIVE 11/29/2016 1810   KETONESUR NEGATIVE 11/29/2016 1810   PROTEINUR NEGATIVE 11/29/2016 1810   NITRITE NEGATIVE 11/29/2016 1810   LEUKOCYTESUR NEGATIVE 11/29/2016 1810   Sepsis Labs Invalid input(s): PROCALCITONIN,  WBC,  LACTICIDVEN Microbiology Recent Results (from the past 240 hour(s))  MRSA PCR Screening     Status: None   Collection Time: 11/29/16  9:43 PM  Result Value Ref Range Status   MRSA by PCR NEGATIVE NEGATIVE Final    Comment:        The GeneXpert MRSA Assay (FDA approved for NASAL specimens only), is one  component of a comprehensive MRSA colonization surveillance program. It is not intended to diagnose MRSA infection nor to guide or monitor treatment for MRSA infections.      Time coordinating discharge: Over 30 minutes  SIGNED:   Hosie Poisson,  MD  [facility] 12/03/2016, 2:55 PM Pager   If 7PM-7AM, please contact night-coverage [email or web address] Password [email or web address]

## 2016-12-03 NOTE — Anesthesia Preprocedure Evaluation (Addendum)
Anesthesia Evaluation  Patient identified by MRN, date of birth, ID band Patient awake    Reviewed: Allergy & Precautions, NPO status , Patient's Chart, lab work & pertinent test results  Airway Mallampati: II  TM Distance: >3 FB Neck ROM: Full    Dental  (+) Poor Dentition,    Pulmonary shortness of breath,    Pulmonary exam normal        Cardiovascular hypertension, + angina + CAD (1993 s/p MI - [facility];  b. s/p BMS to LAD '00;  c. PTCA 2nd diagonal 2010;  d. 02/18/12 Cath: moderate nonobs dzs - med rx;  e.  01/2015 Cath: LM nl, LAD 40-60m ISR, 70-56m/d, d1 90p (3.0x16 Synergy DES), D2 50-60, LCX nl, OM1 50p, 69m (2.5x12 Synerg) and +CHF  + dysrhythmias Atrial Fibrillation + Cardiac Defibrillator + Valvular Problems/Murmurs MR  Rhythm:Irregular Rate:Tachycardia  Echo 79/3903 Systolic function was moderately to severely reduced. Theestimated ejection fraction was in the range of 30% to 35%.Features are consistent with a pseudonormal left ventricularfilling pattern, with concomitant abnormal relaxation andincreased filling pressure (grade 2 diastolic dysfunction). - Ventricular septum: Septal motion showed abnormal function,dyssynergy, and paradox. - Mitral valve: Prior procedures included surgical repair. Anannular ring prosthesis was present. There was moderateregurgitation directed eccentrically and anteriorly. Valve areaby pressure half-time: 2.29 cm^2. - Left atrium: The atrium was severely dilate   Neuro/Psych PSYCHIATRIC DISORDERS Anxiety Depression    GI/Hepatic GERD  ,  Endo/Other  diabetes, Type 2  Renal/GU CRFRenal disease     Musculoskeletal  (+) Arthritis ,   Abdominal   Peds  Hematology   Anesthesia Other Findings   Reproductive/Obstetrics                            Anesthesia Physical  Anesthesia Plan  ASA: IV  Anesthesia Plan: General   Post-op Pain  Management:    Induction: Intravenous  Airway Management Planned: Simple Face Mask and Mask  Additional Equipment:   Intra-op Plan:   Post-operative Plan:   Informed Consent: I have reviewed the patients History and Physical, chart, labs and discussed the procedure including the risks, benefits and alternatives for the proposed anesthesia with the patient or authorized representative who has indicated his/her understanding and acceptance.   Dental advisory given  Plan Discussed with: CRNA  Anesthesia Plan Comments:         Anesthesia Quick Evaluation

## 2016-12-07 ENCOUNTER — Other Ambulatory Visit: Payer: Self-pay | Admitting: *Deleted

## 2016-12-07 ENCOUNTER — Other Ambulatory Visit: Payer: Self-pay | Admitting: Pharmacist

## 2016-12-07 ENCOUNTER — Other Ambulatory Visit: Payer: Self-pay

## 2016-12-07 NOTE — Telephone Encounter (Signed)
Patient contacted regarding discharge from [facility] on 12/03/16.  Patient understands to follow up with provider Tommye Standard, PA on 12/10/16 at 9:00 am at [facility]. Patient understands discharge instructions? Yes Patient understands medications and regiment? Yes Patient understands to bring all medications to this visit? Yes  Patient denies complaints; reports pulse 67 bpm and that he is taking all medications per instruction

## 2016-12-07 NOTE — Patient Outreach (Addendum)
[facility]) Care Management  [place]   12/07/2016  [name] [name] February 16, 1951 [id number]  Subjective:  65 year old male referred to [place] for medication assistance. Patient was recently admitted on 11/29/16 with atrial fibrillation with rapid ventricular response, acute on chronic systolic heart failure exacerbation, and hyperglycemia. Transesophageal ECHO revealed LVEF of 25-30% and was followed by successful cardioversion. Patient reports he has all of his medications post discharge including amiodarone which is new and he had an $5.80 copay.  Discharge instructions were reviewed with patient by [facility] nurse via telephone earlier today. Patient reports adherence to his medications.  Patient plans to continue Novolog 75/25 pen 35 units twice daily until he finishes the pen and he will switch to the Novolog 70/30 vial which was provided by [facility] medication emergency fund.   Patient reported vitals today: blood pressure 156/95 Heart rate 67  CBG fasting 225 mg/dL   Patient reports he has been taking warfarin 1.5 mg since discharge instead of the prescribed 1 mg dose.   Denies signs/symptoms of bleeding but does report some bruises on the back of his hands.   Denies signs/symptoms of hypoglycemia. Reports proper treatment knowledge of hyperglycemia.    Patient was recently discharged from hospital and all medications have been reviewed.  Objective:  Lab Results  Component Value Date   HGBA1C 12.8 (H) 09/27/2016   Lipid Panel     Component Value Date/Time   CHOL 116 09/30/2016 0440   TRIG 162 (H) 09/30/2016 0440   HDL 26 (L) 09/30/2016 0440   CHOLHDL 4.5 09/30/2016 0440   VLDL 32 09/30/2016 0440   LDLCALC 58 09/30/2016 0440   Encounter Medications: Outpatient Encounter Prescriptions as of 12/07/2016  Medication Sig Note  . amiodarone (PACERONE) 200 MG tablet Take 1 tablet (200 mg total) by mouth 2 (two) times daily.   Marland Kitchen aspirin EC 81 MG tablet  Take 81 mg by mouth daily.   Marland Kitchen atorvastatin (LIPITOR) 20 MG tablet Take 20 mg by mouth daily at 6 PM.    . carvedilol (COREG) 25 MG tablet Take 1 tablet (25 mg total) by mouth 2 (two) times daily with a meal.   . FLUoxetine (PROZAC) 40 MG capsule Take 40 mg by mouth daily.   . insulin aspart protamine- aspart (NOVOLOG MIX 70/30) (70-30) 100 UNIT/ML injection Inject 35 Units into the skin 2 (two) times daily with a meal. 12/07/2016: Reports taking 75/25   35 units twice a day until he finishes the pen he has and then will get use the 70/30 vial that he has.   . nitroGLYCERIN (NITROSTAT) 0.4 MG SL tablet Place 1 tablet (0.4 mg total) under the tongue every 5 (five) minutes x 3 doses as needed for chest pain. (Patient not taking: Reported on 12/07/2016)   . omeprazole (PRILOSEC) 40 MG capsule Take 40 mg by mouth daily.   . sitaGLIPtin (JANUVIA) 100 MG tablet Take 100 mg by mouth daily.   . tamsulosin (FLOMAX) 0.4 MG CAPS capsule Take 1 capsule (0.4 mg total) by mouth daily.   . traZODone (DESYREL) 100 MG tablet Take 100 mg by mouth at bedtime as needed for sleep.   . valsartan (DIOVAN) 80 MG tablet Take 1 tablet (80 mg total) by mouth daily.   Marland Kitchen warfarin (COUMADIN) 1 MG tablet Take 1 tablet (1 mg total) by mouth one time only at 6 PM. (Patient taking differently: Take 1.5 mg by mouth daily at 6 PM. ) 12/07/2016:  Reviewed with patient that his discharge dose has changed to 1mg . Patient voiced understanding.   No facility-administered encounter medications on file as of 12/07/2016.     Functional Status: In your present state of health, do you have any difficulty performing the following activities: 11/29/2016 11/29/2016  Hearing? N Y  Vision? N Y  Difficulty concentrating or making decisions? N Y  Walking or climbing stairs? N Y  Dressing or bathing? N N  Doing errands, shopping? N Y  Conservation officer, nature and eating ? - N  Using the Toilet? - N  In the past six months, have you accidently leaked  urine? - Y  Do you have problems with loss of bowel control? - N  Managing your Medications? - Y  Managing your Finances? - Y  Housekeeping or managing your Housekeeping? - Y  Some recent data might be hidden    Fall/Depression Screening: PHQ 2/9 Scores 11/29/2016 10/07/2016  PHQ - 2 Score 5 0  PHQ- 9 Score 17 -    Assessment: Drugs sorted by system: Neurologic/Psychologic:fluoxetine 40 mg daily, trazodone 100 mg daily as needed Cardiovascular: amiodarone 200 mg twice daily, aspirin 81 mg daily, atorvastatin 20 mg daily, carvedilol 25 mg twice daily, valsartan 80 mg daily, warfarin 1.5 mg daily (prescribed 1 mg daily at discharge) Gastrointestinal: omeprazole 40 mg daily Endocrine: Novolog 75/25 35 units twice daily (will start Novolog 70/30 once finishes Novolog 75/25 pen), sitagliptin 100 mg daily Renal: tamsulosin 0.4 mg daily  Medications to avoid in the elderly: fluoxetine Drug interactions: amiodarone + fluoxetine may increase QTc prolongation, warfarin + amiodarone may increase risk of bleeding Other issues noted: Patient reports adherence to his medications.  Medication Assistance:  [facility] Medicare patient that cannot afford the $47 copay for his insulin or his Januvia.  [facility] pharamacy has provided him a month supply of Januvia Novolog 70/30, omeprazole and aspirin using our emergency medication fund.  Patient is awaiting results of low income subsidy application which was submitted on 12/01/2016 and is also awaiting [facility] social work consult to see if additional housing/food/other resources are available in [place]. Patient can receive all his medications with the exception of Novolog and Januvia for $0 copay if he receives 90 day supply via [employer] order.   Plan: -Instructed patient to call [facility] for 90 day supply of all of his medications except for Januvia/Novolog so he can receive them prior to the new year with a $0 copay -Instructed patient that per his  discharge medication list he is to take Warfarin 1 mg daily not 1.5 mg daily.  -Will contact patients primary care [name] PA to see if the office has samples and to consider stopping Januvia as this is an additional $47 copay for the patient monthly when the patient already has trouble affording insulin. -Will followup in 1 week via telephone to further assist with medication assistance once [facility] social worker has provided patient with available resources.  Bennye Alm, PharmD, [place] PGY2 Pharmacy Resident [phone]

## 2016-12-07 NOTE — Patient Outreach (Signed)
Transition of care call: Discharged on 12/03/2016 after cardioversion:  Placed call to patient who reports that he is weak. States no rapid heart beat at this time. States that he is taking his medications as prescribed. States he is taking 75/25 insulin pen for now until he runs out, then he plans to start his 70/30 vial.  Reports todays CBG of 225 this am.  Reports that he has all his medications including new medication Amiodarone. ( reports that he had a 5.80 copay).    Reviewed discharge instructions. Reviewed pending cardiology follow up and primary follow up with labs. Patient reports that money is tight and he does not know if he will have gas money to get to cardiology follow up. Reports low on food.    PLAN: Reviewed importance of follow up with primary MD and cardiology. In basket message sent to [place] worker to follow up on transportation/ gas / food resources. Encouraged patient to take all medications as prescribed.  Reviewed when to call MD for any changes in condition.  Will continue weekly transition of care calls. Will send this note to MD to inform of discharge and need for follow up.   [facility] CM Care Plan Problem One   Flowsheet Row Most Recent Value  Care Plan Problem One  Recent hospitalization for paroxysmal a-flutter/a-fib, heart failure, hyperglycemia   Role Documenting the Problem One  Care Management [place] for Problem One  Active  [facility] Long Term Goal (31-90 days)  Patient will not have a hospitalization within the next 90 days  [facility] Long Term Goal Start Date  10/01/16  [facility] Long Term Goal Met Date  11/29/16  Interventions for Problem One Long Term Goal  Home visit completed today. Md office notified of concerns Reviewed with patient how to take medications and reasons to call MD.  [facility] CM Short Term Goal #1 (0-30 days)  Patient will attend all scheduled appointments within the next 30 days  [facility] CM Short Term Goal #1 Start Date  10/01/16  [facility] CM  Short Term Goal #1 Met Date  12/07/16  Interventions for Short Term Goal #1  Encouraged patient to attend MD visit today. States he will. Wife to drive.  [facility] CM Short Term Goal #2 (0-30 days)  Patient will adhere to taking all prescribed medications within the next 30 days  [facility] CM Short Term Goal #2 Start Date  10/01/16  [facility] CM Short Term Goal #2 Met Date  12/07/16  Interventions for Short Term Goal #2  Home visit completed. reviewed correct medications and doses.  Phamacy referral completed. Called MD office to inform of struggle to obtain medications.   [facility] CM Short Term Goal #3 (0-30 days)  Patient will reports monitoring and recording CBG daily in [facility] calendar for 30 days.   [facility] CM Short Term Goal #3 Start Date  11/22/16  Interventions for Short [place] Goal #3  Reviewed importance of daily monitoring and taking medications as prescribed.     [facility] CM Care Plan Problem Two   Flowsheet Row Most Recent Value  Care Plan Problem Two  Recent admission for reoccurance of atrial fib.  Role Documenting the Problem Two  Care Management [place] for Problem Two  Active  Interventions for Problem Two Long Term Goal   Reviewed discharge instructions, reviewed pending follow up appointments.  [facility] Long Term Goal (31-90) days  Patient will report no readmission  for atrial fib in the next 60  days.  [facility] Long Term Goal Start Date  12/07/16  [facility] CM Short Term Goal #1 (0-30 days)  Patient will follow up with cardiology as scheduled in the next week.  [facility] CM Short Term Goal #1 Start Date  12/07/16  Interventions for Short Term Goal #2   in basket message to [facility] social worker to assist. Encouraged patient to attend appointment.  [facility] CM Short Term Goal #2 (0-30 days)  [place] will take all medication as prescribed in the next 30 days.   [facility] CM Short Term Goal #2 Start Date  12/07/16  Interventions for Short Term Goal #2  reviewed discharge medications and reason for taking. Encouraged patient to  contact [place] for difficulty affording meds. Cautioned patient about missing any doses.       Tomasa Rand, RN, BSN, CEN [facility] [employer] [phone]

## 2016-12-07 NOTE — Patient Outreach (Signed)
[facility]) Care Management  12/07/2016  [place] 04-Jun-1951 [id number]   CSW spoke with patient and his wife by phone. She reports he has gotten home and is doing fairly well- He has a follow up appontment with his heart doctor on 12/29 but he indicates he does not have the money for the copay at office so plans to reschedule it for January. CSW advised him of plans to look into options for him related to to these barriers/ffinancial challenges and call him back tomorrow.   Eduard Clos, MSW, [place] Worker  [place] [phone]

## 2016-12-07 NOTE — Patient Outreach (Signed)
[facility]) Care Management  12/07/2016  [place] 1951-03-20 [id number]   CSW contacted patient and wife at home; per wife, he has returned home from hospital and is doing well. She reports they do not have nay money until 12/15/16 when they receive their checks. "We only have a chicken left but will go to the food bank tomorrow."   CSW advised patient and wife of plans to seek resources to assist/support them and plans for follow up call tomorrow.      Eduard Clos, MSW, [place] Worker  [place] [phone]

## 2016-12-08 ENCOUNTER — Other Ambulatory Visit: Payer: Self-pay | Admitting: *Deleted

## 2016-12-09 ENCOUNTER — Other Ambulatory Visit: Payer: Self-pay | Admitting: *Deleted

## 2016-12-09 NOTE — Progress Notes (Signed)
Cardiology Office Note Date:  12/10/2016  Patient ID:  [name] [name] 1950-12-14, MRN [id number] PCP:  [name], [place]  Cardiologist:  Dr. Martinique Electrophysiologist: Dr. Rayann Heman   Chief Complaint: post hospital f/u, TCM  History of Present Illness: [name] [name] is a 65 y.o. male with history of CAD (Feb 2016 last intervention DES to LAD and LCx), CKD (stage III), ICM w/ICD, combined CHF, MVP w/MV annuloplasty (2004), HTN, HLD, DM, persistent AFib recently hospitalized with c/o SOB/dizziness/CP found in AFib/FVR and perhaps CHF exacerbation.  He was admitted 11/30/16, underwent TEE/DCCV 12/03/16 and was discharged.  Regarding his history of ischemic cardiomyopathy, he underwent stenting of the proximal LAD and diagonal branch in 2000. Cardiac caths in 2005 Dorthula Rue MD) and 2013 ([place]) showed no obstructive disease. He is s/p MV repair at [facility] in 2004. He has chronic moderate MR. In February 2016 he underwent right and left heart cath. He had stenting of the first diagonal and first OM with DES. There was diffuse LAD disease treated medically. EF 30-35%. Mild MR and normal right heart pressures  Cardiac consult did not have the impression had fluid OL at the time.  He was initially treated with IV diltiazem, though switched to amiodarone and remains on 200mg  BID.  He is feeling well.  He denies any kind of CP or palpitations, no exertional or rest SOB, no symptoms of PND or orthopnea, no dizziness, near syncope or syncope.  He has not been shocked by his ICD.  He denies any bleeding or signs of bleeding, reports weekly INR checks/warfarin managed with his PMD.  He does mention that over the last few years a generalized slow down, reduction in energy.  d/c labs stable,  INR 12/03/16 therapeutic at 2.75  Device History: STJ dual chamber ICD implanted 2007 for ICM; gen change 2014 History of appropriate therapy: No History of AAD therapy: Yes - amiodarone  for AF The patient is aware of the device advisory and has been demonstrated previously the vibratory alert. He has his home Merlin at his bedside and plugged in, doing remote monitoring.  He understands that recommendation from SJM is to not replace the device at this time. The patient is not device dependant.  The patient has not had appropriate device therapy in the past or implanted for secondary prevention.  He understands the importance of compliance with remote transmissions.  AF hx: paroxysmal Afib 2010  Atypical Aflutter Oct 2017 >> started on warfarin TEE/DCCV 09/29/16 TEE/DCCV 12/03/16 >> started on amiodarone  Past Medical History:  Diagnosis Date  . Anxiety   . Arthritis   . CAD (coronary artery disease)    a.  1993 s/p MI - [employer];  b. s/p BMS to LAD '00;  c. PTCA 2nd diagonal 2010;  d. 02/18/12 Cath: moderate nonobs dzs - med rx;  e.  01/2015 Cath: LM nl, LAD 40-35m ISR, 70-60m/d, d1 90p (3.0x16 Synergy DES), D2 50-60, LCX nl, OM1 50p, 72m (2.5x12 Synergy DES), RCA nl, EF 30-35%.  . Chronic combined systolic and diastolic CHF (congestive heart failure) ([place])    a. 12/2014 Echo: EF 30-35%, Gr2 DD, mod MR, sev dil LA.  . CKD (chronic kidney disease), stage III   . Depression   . ED (erectile dysfunction)   . GERD (gastroesophageal reflux disease)   . Hyperlipidemia   . Hypertension   . Ischemic cardiomyopathy    a. s/p St. [name] (Atlas) ICD implanted in [place] 2007;  b. 12/2014 Echo: Ef 30-35%.  . MVP (mitral valve prolapse)    a. s/p MV annuloplasty at [facility] 2004.  Marland Kitchen Persistent atrial fibrillation ([place])    a. noted on ICD interrogation '10 - not previously on [place] - CHA2DS2VASc = 5.  . Type II diabetes mellitus ([place])    uncontrolled    Past Surgical History:  Procedure Laterality Date  . CARDIAC DEFIBRILLATOR PLACEMENT  2007   implanted in [place], has a 7001 RV lead and a SJM Atlas ICD followed by Dr Caryl Comes  . CARDIOVERSION N/A 09/29/2016    Procedure: CARDIOVERSION;  Surgeon: Lelon Perla, MD;  Location: [facility] ENDOSCOPY;  Service: Cardiovascular;  Laterality: N/A;  . CARDIOVERSION N/A 12/03/2016   Procedure: CARDIOVERSION;  Surgeon: Dorothy Spark, MD;  Location: [place];  Service: Cardiovascular;  Laterality: N/A;  . IMPLANTABLE CARDIOVERTER DEFIBRILLATOR (ICD) GENERATOR CHANGE N/A 03/19/2013   SJM Fortify ST DR generator placed by Dr Lovena Le, part of Analyze ST study  . LEFT AND RIGHT HEART CATHETERIZATION WITH CORONARY ANGIOGRAM N/A 01/14/2015   Procedure: LEFT AND RIGHT HEART CATHETERIZATION WITH CORONARY ANGIOGRAM;  Surgeon: Peter M Martinique, MD;  Location: [facility] CATH LAB;  Service: Cardiovascular;  Laterality: N/A;  . LEFT HEART CATHETERIZATION WITH CORONARY ANGIOGRAM N/A 02/17/2012   Procedure: LEFT HEART CATHETERIZATION WITH CORONARY ANGIOGRAM;  Surgeon: Jolaine Artist, MD;  Location: [facility] CATH LAB;  Service: Cardiovascular;  Laterality: N/A;  . MITRAL VALVE ANNULOPLASTY  2004   Archie Endo 02/17/2012  . TEE WITHOUT CARDIOVERSION N/A 09/29/2016   Procedure: TRANSESOPHAGEAL ECHOCARDIOGRAM (TEE);  Surgeon: Lelon Perla, MD;  Location[facility] ENDOSCOPY;  Service: Cardiovascular;  Laterality: N/A;  . TEE WITHOUT CARDIOVERSION N/A 12/03/2016   Procedure: TRANSESOPHAGEAL ECHOCARDIOGRAM (TEE);  Surgeon: Dorothy Spark, MD;  Location: [facility] ENDOSCOPY;  Service: Cardiovascular;  Laterality: N/A;    Current Outpatient Prescriptions  Medication Sig Dispense Refill  . amiodarone (PACERONE) 200 MG tablet Take 1 tablet (200 mg total) by mouth 2 (two) times daily. 30 tablet 0  . aspirin EC 81 MG tablet Take 81 mg by mouth daily.    Marland Kitchen atorvastatin (LIPITOR) 20 MG tablet Take 20 mg by mouth daily at 6 PM.     . carvedilol (COREG CR) 10 MG 24 hr capsule Take 10 mg by mouth 2 (two) times daily.    Marland Kitchen FLUoxetine (PROZAC) 40 MG capsule Take 40 mg by mouth daily.    . insulin aspart protamine- aspart (NOVOLOG MIX 70/30) (70-30) 100 UNIT/ML injection  Inject 35 Units into the skin 2 (two) times daily with a meal.    . nitroGLYCERIN (NITROSTAT) 0.4 MG SL tablet Place 1 tablet (0.4 mg total) under the tongue every 5 (five) minutes x 3 doses as needed for chest pain. 25 tablet 12  . omeprazole (PRILOSEC) 40 MG capsule Take 40 mg by mouth daily.    . sitaGLIPtin (JANUVIA) 100 MG tablet Take 100 mg by mouth daily.    . tamsulosin (FLOMAX) 0.4 MG CAPS capsule Take 1 capsule (0.4 mg total) by mouth daily. 30 capsule 0  . traZODone (DESYREL) 100 MG tablet Take 100 mg by mouth at bedtime as needed for sleep.    . valsartan (DIOVAN) 80 MG tablet Take 1 tablet (80 mg total) by mouth daily. 30 tablet 0  . warfarin (COUMADIN) 1 MG tablet Take 1 tablet (1 mg total) by mouth one time only at 6 PM. (Patient taking differently: Take 1.5 mg by mouth daily at 6  PM. ) 30 tablet 0   No current facility-administered medications for this visit.     Allergies:   Metformin and related and Lisinopril   Social History:  The patient  reports that he has never smoked. He has never used smokeless tobacco. He reports that he drinks alcohol. He reports that he does not use drugs.   Family History:  The patient's family history includes Colon cancer in his mother and sister; Coronary artery disease in his brother and mother; Heart attack in his mother; Heart disease in his brother and mother; Heart failure in his mother; Hypertension in his mother; Malignant hyperthermia in his mother.  ROS:  Please see the history of present illness.    All other systems are reviewed and otherwise negative.   PHYSICAL EXAM:  VS:  BP 140/88   Pulse (!) 54   Ht 5\' 11"  (1.803 m)   Wt 238 lb (108 kg)   BMI 33.19 kg/m  BMI: Body mass index is 33.19 kg/m. Well nourished, well developed, in no acute distress  HEENT: normocephalic, atraumatic  Neck: no JVD, carotid bruits or masses Cardiac:  RRR; no significant murmurs, no rubs, or gallops Lungs:  clear to auscultation bilaterally, no  wheezing, rhonchi or rales  Abd: soft, nontender MS: no deformity or atrophy Ext: no edema b/l Skin: warm and dry, no rash Neuro:  No gross deficits appreciated Psych: euthymic mood, full affect  ICD site is stable, no tethering or discomfort  ICD interrogation done today and reviewed by myself: stable battery and lead status, he presents in SB, 56bpm, no arrhythmias/episodes  12/03/16: TEE Study Conclusions - Left ventricle: Systolic function was severely reduced. The   estimated ejection fraction was in the range of 25% to 30%.   Diffuse hypokinesis. - Aortic valve: Structurally normal valve. Trileaflet; normal   thickness leaflets. There was no significant regurgitation. - Aorta: There was moderate non-mobile atheroma. - Ascending aorta: The ascending aorta was mildly dilated measuring   40 mm. - Descending aorta: The descending aorta was normal in size. - Mitral valve: There was mild to moderate regurgitation. No   paravalvular leak. - Left atrium: The atrium was dilated. No evidence of thrombus in   the atrial cavity or appendage. No evidence of thrombus in the   atrial cavity or appendage. No evidence of thrombus in the   appendage. - Right ventricle: Systolic function was mildly reduced. - Right atrium: No evidence of thrombus in the atrial cavity or   appendage. - Atrial septum: No defect or patent foramen ovale was identified. - Tricuspid valve: There was mild regurgitation. - Pericardium, extracardiac: There was no pericardial effusion. Impressions: - TEE was followed by a successful cardioversion.  Recent Labs: 09/27/2016: TSH 2.313 09/29/2016: Magnesium 2.0 11/29/2016: ALT 17; B Natriuretic Peptide 279.8 12/03/2016: BUN 14; Creatinine, Ser 1.52; Hemoglobin 13.6; Platelets 215; Potassium 4.5; Sodium 140  09/30/2016: Cholesterol 116; HDL 26; LDL Cholesterol 58; Total CHOL/HDL Ratio 4.5; Triglycerides 162; VLDL 32   Estimated Creatinine Clearance (by C-G formula  based on SCr of 1.52 mg/dL (H)) Male: 48.6 mL/min Male: 59 mL/min   Wt Readings from Last 3 Encounters:  12/10/16 238 lb (108 kg)  12/03/16 232 lb 3.2 oz (105.3 kg)  11/29/16 230 lb (104.3 kg)     Other studies reviewed: Additional studies/records reviewed today include: summarized above  ASSESSMENT AND PLAN:  1. Persistent AFib, atypical Aflutter     CHA2DS2Vasc is at least 4, on warfarin monitored and  managed by his PMD     Continue amiodarone 200mg  BID for one more week, then reduce to 200mg  once daily     Discussed amiodarone and surveillance for potential side effects.  He is instructed to see her eye MD annually, will get baseline PFTs/DCLO, check LFTs,  and TSH today  2. CAD     No anginal complaints     On ASA, statin, BB     C/w Dr. Martinique   3. ICM w/ICD     normal device function, no changes  4. Chronic combined CHF      His weight is up, though his exam is negative for evidence of fluid OL and having no symptoms      Discussed daily weights, minimizing sodium intake and should he gain 3lbs in a day or 5 in a week to notify us  5. HTN     Stable       Disposition: F/u with Dr. Martinique in 3 months, 3 month remote device check, Dr. Rayann Heman in 1 year, sooner if needed.  The patient was unsure of his current coreg dose, will check it and call to confirm.  The amiodarone instructions were not included on his instructions today to the [place] and not included on his AVS, though I did verbally discuss with the patient, I have asked that he be called with the instructions.  BMET was added to lab request.  Current medicines are reviewed at length with the patient today.  The patient did not have any concerns regarding medicines.  Haywood Lasso, PA-C 12/10/2016 2:57 PM     [place] [phone] (office)  [phone] (fax)

## 2016-12-09 NOTE — Patient Outreach (Signed)
  [facility]) Care Management  [facility] Social Work  12/09/2016  [name] [name] 1951-06-22 [id number]  CSW spoke with patient and confirmed his ride for tomorrow to MD appointment with "[employer]ng[employer]". He asked if his wife could ride with him and I have confirmed and will advise the transport company. CSW also advised patient that the MD office has confirmed with CSW that they can bill him for his copay.   Patient appreciative of CSW assist.     Eduard Clos, MSW, [place] Worker  [place] [phone]

## 2016-12-10 ENCOUNTER — Telehealth: Payer: Self-pay | Admitting: *Deleted

## 2016-12-10 ENCOUNTER — Ambulatory Visit (INDEPENDENT_AMBULATORY_CARE_PROVIDER_SITE_OTHER): Payer: Commercial Managed Care - HMO | Admitting: Physician Assistant

## 2016-12-10 VITALS — BP 140/88 | HR 54 | Ht 71.0 in | Wt 238.0 lb

## 2016-12-10 DIAGNOSIS — I2510 Atherosclerotic heart disease of native coronary artery without angina pectoris: Secondary | ICD-10-CM

## 2016-12-10 DIAGNOSIS — Z95810 Presence of automatic (implantable) cardiac defibrillator: Secondary | ICD-10-CM

## 2016-12-10 DIAGNOSIS — Z5181 Encounter for therapeutic drug level monitoring: Secondary | ICD-10-CM

## 2016-12-10 DIAGNOSIS — I5042 Chronic combined systolic (congestive) and diastolic (congestive) heart failure: Secondary | ICD-10-CM | POA: Diagnosis not present

## 2016-12-10 DIAGNOSIS — I255 Ischemic cardiomyopathy: Secondary | ICD-10-CM

## 2016-12-10 DIAGNOSIS — I10 Essential (primary) hypertension: Secondary | ICD-10-CM

## 2016-12-10 DIAGNOSIS — Z79899 Other long term (current) drug therapy: Secondary | ICD-10-CM | POA: Diagnosis not present

## 2016-12-10 DIAGNOSIS — I480 Paroxysmal atrial fibrillation: Secondary | ICD-10-CM

## 2016-12-10 LAB — BASIC METABOLIC PANEL
BUN: 14 mg/dL (ref 7–25)
CHLORIDE: 107 mmol/L (ref 98–110)
CO2: 18 mmol/L — AB (ref 20–31)
CREATININE: 1.29 mg/dL — AB (ref 0.70–1.25)
Calcium: 8.7 mg/dL (ref 8.6–10.3)
Glucose, Bld: 170 mg/dL — ABNORMAL HIGH (ref 65–99)
Potassium: 4.3 mmol/L (ref 3.5–5.3)
Sodium: 139 mmol/L (ref 135–146)

## 2016-12-10 LAB — HEPATIC FUNCTION PANEL
ALK PHOS: 63 U/L (ref 40–115)
ALT: 12 U/L (ref 9–46)
AST: 15 U/L (ref 10–35)
Albumin: 4 g/dL (ref 3.6–5.1)
BILIRUBIN INDIRECT: 0.4 mg/dL (ref 0.2–1.2)
BILIRUBIN TOTAL: 0.6 mg/dL (ref 0.2–1.2)
Bilirubin, Direct: 0.2 mg/dL (ref <=0.2)
Total Protein: 6.3 g/dL (ref 6.1–8.1)

## 2016-12-10 LAB — TSH: TSH: 3.45 m[IU]/L (ref 0.40–4.50)

## 2016-12-10 MED ORDER — AMIODARONE HCL 200 MG PO TABS: 200.0000 mg | ORAL_TABLET | Freq: Every day | ORAL | 6 refills | Status: DC

## 2016-12-10 NOTE — Patient Instructions (Signed)
Medication Instructions:    * PLEASE CONTACT OFFICE BACK ABOUT YOUR COREG DOSE *  Your physician recommends that you continue on your current medications as directed. Please refer to the Current Medication list given to you today.   If you need a refill on your cardiac medications before your next appointment, please call your pharmacy.  Labwork: LFT AND TSH   Testing/Procedures: Your physician has recommended that you have a pulmonary function test. Pulmonary Function Tests are a group of tests that measure how well air moves in and out of your lungs.   Follow-Up: IN 3 MONTHS WITH DR Martinique  Remote monitoring is used to monitor your Pacemaker of ICD from home. This monitoring reduces the number of office visits required to check your device to one time per year. It allows [place] to keep an eye on the functioning of your device to ensure it is working properly. You are scheduled for a device check from home on .01/10/17..You may send your transmission at any time that day. If you have a wireless device, the transmission will be sent automatically. After your physician reviews your transmission, you will receive a postcard with your next transmission date.     Any Other Special Instructions Will Be Listed Below (If Applicable).

## 2016-12-10 NOTE — Telephone Encounter (Signed)
SPOKE TO PT PER URSUY TO TAKE AMIODARONE 200 MG TWICE A DAY FOR ONE WEEK THEN RESUME TAKING 200 MG ONCE A DAY . PT AWARE A NEW RX HAS BEEN SENT INTO PHARMACY WITH INSTRUCTIONS

## 2016-12-14 ENCOUNTER — Other Ambulatory Visit: Payer: Self-pay | Admitting: Pharmacist

## 2016-12-14 ENCOUNTER — Other Ambulatory Visit: Payer: Self-pay

## 2016-12-14 NOTE — Patient Outreach (Addendum)
[facility]) Care Management  12/14/2016  PATRICIA PERALES 08/02/1951 [id number]  66 year old male referred to [place] for medication assistance.  Patient states that he has not yet obtained his medications from [facility].  Patient reports he has just started the Novolog 70/30 vial and still has ~75% remaining.  Denies signs/symptoms of hypoglycemia.  Reports most CBGs 160-180 mg/dL and he is checking CBGs 3 times per day.  He has been writing down CBGs and home blood pressures.  He reports his BP has been running in 160/100s.  Reports heart rate at home has been in 50-60s. He was started on heart monitor by cardiology.  He reports adherence to his medications.  Denies signs/symptoms of bleeding on Warfarin.  Patient has been denied extra help/low income subsidy. Patient is unsure if he will be able to afford $47 copay for his insulin.  He is currently receiving his medications through [place]. Patient states all of his prescriptions are written for 90 day supply.    Patient was recently discharged from hospital and all medications have been reviewed.   Assessment: [employer] Medicare patient that cannot afford the $47 copay for his insulin or his Januvia.  [facility] has provided him a month supply of Januvia Novolog 70/30, omeprazole and aspirin using our emergency medication fund last month.  Patient has been denied low income subsidy/extra help.    Plan Followup home visit in 2 weeks to assess medication assistance and need for further pharmacy services.  Will contact patients primary care provider and cardiologist to send in 90 day supply of medications to [facility] mail order so patient will be able to pay $0 copay.   Will recommend [name], PA to send 90 day prescriptions to [facility] order for Warfarin, Trazodone, Tamsulosin, omeprazole, fluoxetine Will recommend Tommye Standard, PA to send 90 day prescriptions to [facility] order for amiodarone, valsartan,  carvedilol, atorvastatin  Bennye Alm, PharmD, [place] PGY2 Pharmacy Resident [phone]

## 2016-12-14 NOTE — Patient Outreach (Signed)
Transition of care call: Placed call to patient. No answer. Left a message for a returned call. Noted that [place] spoke with patient today. Placed call to wife's cell. Phone has been disconnected.  PLAN: Will continue weekly outreach attempts. Tomasa Rand, RN, BSN, CEN [facility] [employer] [phone]

## 2016-12-17 ENCOUNTER — Other Ambulatory Visit: Payer: Self-pay | Admitting: *Deleted

## 2016-12-17 DIAGNOSIS — I255 Ischemic cardiomyopathy: Secondary | ICD-10-CM

## 2016-12-17 MED ORDER — ATORVASTATIN CALCIUM 20 MG PO TABS: 20.0000 mg | ORAL_TABLET | Freq: Every day | ORAL | 2 refills | Status: DC

## 2016-12-17 MED ORDER — VALSARTAN 80 MG PO TABS: 80.0000 mg | ORAL_TABLET | Freq: Every day | ORAL | 2 refills | Status: DC

## 2016-12-17 MED ORDER — AMIODARONE HCL 200 MG PO TABS: 200.0000 mg | ORAL_TABLET | Freq: Every day | ORAL | 2 refills | Status: DC

## 2016-12-17 MED ORDER — CARVEDILOL 25 MG PO TABS: 25.0000 mg | ORAL_TABLET | Freq: Two times a day (BID) | ORAL | 2 refills | Status: DC

## 2016-12-20 ENCOUNTER — Other Ambulatory Visit: Payer: Self-pay

## 2016-12-20 NOTE — Patient Outreach (Addendum)
Transition of care call: Placed call to patient. No answer. Left a message requesting a call back. Placed call to wife's cell.  This is the wrong number. Will update EPIC.  No return call from last week from patient for transition of care.  PLAN: Will continue to attempt to reach patient for transition of care.   2:50 pm.  Missed call from patient. No voicemail. I called patient back. Patient reports that he is doing well. States no rapid heart beat. Reports elevated BP of 150-160/ 103-107.  Reports that he is taking all medications as prescribed. States that CBG is under 200. Reports taking 70/30 insulin 35 units twice a day. Reviewed pending appointments with patient including [place] and cardiology within the next week. Suggested to patient to call primary MD or cardiologist  with BP log.  Reviewed with patient the importance of INR monitoring.  Patient reports that he has not had his level checked since hospital discharge. Encouraged patient to get his INR level checked.   PLAN: Will follow up with patient in 1 week.   Tomasa Rand, RN, BSN, CEN [facility] [employer] [phone]

## 2016-12-21 ENCOUNTER — Ambulatory Visit: Payer: Self-pay | Admitting: Pharmacist

## 2016-12-22 ENCOUNTER — Ambulatory Visit: Payer: Self-pay | Admitting: Pharmacist

## 2016-12-22 ENCOUNTER — Other Ambulatory Visit: Payer: Self-pay | Admitting: Pharmacist

## 2016-12-22 NOTE — Patient Outreach (Signed)
[facility]) Care Management  12/22/2016  [name] [name] [birth year] [id number]  66 year old male referred to [place] for medication assistance.  Today I have called patients primary care provider and followed up with his cardiologist for his [facility] mail order prescriptions.    Assessment: [employer] Medicare patient that cannot afford the $47 copay for his insulin or his Januvia. [facility] has provided him a month supply of Januvia Novolog 70/30, omeprazole and aspirin using our emergency medication fund last month (december). Patient has been denied low income subsidy/extra help. Patient eligible for Tier 1/2 90 day $0 copay (exceptions include 70/30 insulin and Januvia which are $47/month copay)   Plan: -Per EPIC chart review [name] [name] has sent 90 day supply of amiodarone, atorvastatin, carvedilol, and valsartan to [facility] mail order.   -Have contacted [name], [place]) for refills of remaining medications to be sent to [place] for 90 day supply and also discussed with her nurse that patient has trouble affording his insulin and Januvia at $47 copay per month.  Asked her to consider discontinuing Januvia if patient is unable to receive Januvia samples as patient has trouble affording both copays. -Will plan followup home visit next week to assess medication adherence/assistance and need for further [place] services and the need for pharmacy to open a pharmacy program.  Bennye Alm, PharmD, [place] PGY2 Pharmacy Resident [phone]

## 2016-12-23 DIAGNOSIS — Z7901 Long term (current) use of anticoagulants: Secondary | ICD-10-CM | POA: Diagnosis not present

## 2016-12-27 ENCOUNTER — Other Ambulatory Visit: Payer: Self-pay

## 2016-12-27 NOTE — Patient Outreach (Signed)
Care coordination:  Placed call to patient for care coordination. Patient answered phone and states that home visit planned for tomorrow is still on.  PLAN: will see patient for home visit tomorrow am.  Tomasa Rand, RN, BSN, CEN [place] Coordinator [phone]

## 2016-12-28 ENCOUNTER — Other Ambulatory Visit: Payer: Self-pay

## 2016-12-28 ENCOUNTER — Other Ambulatory Visit: Payer: Self-pay | Admitting: Pharmacist

## 2016-12-28 ENCOUNTER — Telehealth: Payer: Self-pay | Admitting: Cardiology

## 2016-12-28 DIAGNOSIS — Z6833 Body mass index (BMI) 33.0-33.9, adult: Secondary | ICD-10-CM | POA: Diagnosis not present

## 2016-12-28 DIAGNOSIS — Z760 Encounter for issue of repeat prescription: Secondary | ICD-10-CM | POA: Diagnosis not present

## 2016-12-28 DIAGNOSIS — F329 Major depressive disorder, single episode, unspecified: Secondary | ICD-10-CM | POA: Diagnosis not present

## 2016-12-28 DIAGNOSIS — E669 Obesity, unspecified: Secondary | ICD-10-CM | POA: Diagnosis not present

## 2016-12-28 NOTE — Patient Outreach (Signed)
[facility]) Care Management  [place]   12/28/2016  [name] [name] [birth year] [id number]  Subjective: 66 year old male referred to [place] for medication assistance. Pharmacy home visit was performed today with [facility] RN to assess ongoing need for [facility] pharmacy services.  Patients wife assists with filling his medication pill box.  Upon review of the pill box patients carvedilol and valsartan were not in the box but patient states he takes these at night and the box just needs to be refilled.  [facility] pharmacy has asked his cardiologist to send his medications to [facility] mail order for 90 day supply and per chart review amiodarone, atorvastatin, carvedilol, valsartan were all sent to [facility] mail order pharmacy. Patient confirms Mcarthur Rossetti has notified him that these are being mailed to him.  He states during his visit with his primary care today that she is going to send prescriptions to [facility] mail order.  He also states he does have $25 to spend on Relion brand NPH/Regular insulin over the counter at [facility] today but cannot afford the copay for the prescription of insulin due to patient being in the deductible which would be >$200.    Patient denies signs/symptoms of bleeding on warfarin.  He denies signs/symptoms of hypoglycemia.   Patient reported blood pressure readings:  Home SBP [id number] Diastolic 27-035K  Patient reported CBG readings:  CBGs High 100s to Mid 200s  CBG today 237  Objective:   Encounter Medications: Outpatient Encounter Prescriptions as of 12/28/2016  Medication Sig  . aspirin EC 81 MG tablet Take 81 mg by mouth daily.  . carvedilol (COREG) 25 MG tablet Take 1 tablet (25 mg total) by mouth 2 (two) times daily with a meal.  . sitaGLIPtin (JANUVIA) 100 MG tablet Take 100 mg by mouth daily.  Marland Kitchen amiodarone (PACERONE) 200 MG tablet Take 1 tablet (200 mg total) by mouth daily.  Marland Kitchen atorvastatin (LIPITOR) 20 MG tablet Take 1 tablet (20 mg total) by  mouth daily at 6 PM.  . FLUoxetine (PROZAC) 40 MG capsule Take 40 mg by mouth daily.  . insulin aspart protamine- aspart (NOVOLOG MIX 70/30) (70-30) 100 UNIT/ML injection Inject 35 Units into the skin 2 (two) times daily with a meal.  . nitroGLYCERIN (NITROSTAT) 0.4 MG SL tablet Place 1 tablet (0.4 mg total) under the tongue every 5 (five) minutes x 3 doses as needed for chest pain.  Marland Kitchen omeprazole (PRILOSEC) 40 MG capsule Take 40 mg by mouth daily.  . tamsulosin (FLOMAX) 0.4 MG CAPS capsule Take 1 capsule (0.4 mg total) by mouth daily.  . traZODone (DESYREL) 100 MG tablet Take 100 mg by mouth at bedtime as needed for sleep.  . valsartan (DIOVAN) 80 MG tablet Take 1 tablet (80 mg total) by mouth daily.  Marland Kitchen warfarin (COUMADIN) 1 MG tablet Take 1 tablet (1 mg total) by mouth one time only at 6 PM. (Patient taking differently: Take 1 mg by mouth daily at 6 PM. )   No facility-administered encounter medications on file as of 12/28/2016.     Functional Status: In your present state of health, do you have any difficulty performing the following activities: 11/29/2016 11/29/2016  Hearing? N Y  Vision? N Y  Difficulty concentrating or making decisions? N Y  Walking or climbing stairs? N Y  Dressing or bathing? N N  Doing errands, shopping? N Y  Conservation officer, nature and eating ? - N  Using the Toilet? - N  In the past  six months, have you accidently leaked urine? - Y  Do you have problems with loss of bowel control? - N  Managing your Medications? - Y  Managing your Finances? - Y  Housekeeping or managing your Housekeeping? - Y  Some recent data might be hidden    Fall/Depression Screening: PHQ 2/9 Scores 11/29/2016 10/07/2016  PHQ - 2 Score 5 0  PHQ- 9 Score 17 -    Assessment: Drugs sorted by system:  Neurologic/Psychologic: fluoxetine, trazodone  Cardiovascular: amiodarone, carvedilol, valsartan, warfarin, aspirin   Gastrointestinal: omeprazole (also has pantoprazole but only taking  omeprazole)  Endocrine: Novolog 70/30 35 units twice daily, Januvia  Renal: tamsulosin  Medication Assistance: [employer] Medicare patient that cannot afford the copay for his insulin or his Januvia.  Patient is currently in the Medicare deductible phase and cannot afford his insulin or Januvia.  Patient states he may be able to afford $25 NPH/regular insulin over the counter today but is unsure if he will have the money to afford copays in the future.  Patient has been denied low income subsidy/extra help. Patient eligible for Tier 1/2 90 day $0 copay (exceptions include 70/30 insulin and Januvia which are $47/month copay)  Hypertension: Currently uncontrolled on current therapy possibly due to nonadherence with his medications.   Medication Adherence: Upon review of his pill box (filled by patients wife) he may not have been talking carvedilol twice daily (taking once daily) or valsartan once daily.  Patient states he took these last night and his pill box needs to be refilled.    Plan: Provided patient with 7 day pill box and filled 5 days of his medications (5 days only filled due to patient being out of aspirin, fluoxetine, and warfarin after 5 day supply). Instructed patient to contact primary care if he did not hear from [facility] for refills of his medications Instructed patient to only take either omeprazole or pantoprazole and not to take both.   Will contact patients cardiologist to consider increasing his Valsartan.  Instructed patient to call cardiologist with BP readings.   Patient plans to pick up Relion 70/30 insulin for $25 copay today but is unsure if he can afford this in the future.  I have asked him to contact [name], [place] for samples.  Will followup via telephone in 2 weeks to assess medication adherence/assistance.  Bennye Alm, PharmD, BCPS [facility] PGY2 Pharmacy Resident [phone]  [facility] CM Care Plan Problem One   Flowsheet Row Most Recent Value  Care Plan Problem One   Medication adherence related to chronic disease state managment  Role Documenting the Problem One  Clinical Pharmacist  Care Plan for Problem One  Active  [facility] CM Short Term Goal #1 (0-30 days)  Patient will have adherence to his medications >80% of the time per patient report  [facility] CM Short Term Goal #1 Start Date  12/28/16  Interventions for Short Term Goal #1  Counseled on medication adherence, provided patient with 7 day pill box and filled 5 day supply of medications.

## 2016-12-28 NOTE — Telephone Encounter (Signed)
Busy x3

## 2016-12-28 NOTE — Telephone Encounter (Signed)
New message    Pt c/o BP issue: STAT if pt c/o blurred vision, one-sided weakness or slurred speech  1. What are your last 5 BP readings?165/102    178/108   176/100  178/107   174/111   184/109    Today 165/95   2. Are you having any other symptoms (ex. Dizziness, headache, blurred vision, passed out)?  no 3. What is your BP issue? High bp

## 2016-12-28 NOTE — Telephone Encounter (Signed)
No answer

## 2016-12-28 NOTE — Patient Outreach (Signed)
[facility]) Care Management   12/28/2016  [place] [birth year] [id number]  [name] [name] is an 66 y.o. male Arrived for home visit with [place].  Wife present during home visit Subjective:  Patient reports that he saw primary MD today for hospital follow up. Repors INR of 2.0 and repeat labs planned for 1 week.  Patient reports no recent episode of rapid heart beat. Reports no dizziness. No shortness of breath. Patient complains of difficulty with weak legs and frequent falls. States that he fell last night and did not have an injury. Patient reports that he has 1 more dose of insulin left and then he will be out.  States that he paid 22.00 for it last time.  Reports that he is waiting for some of his medications via mail order from [place].  Patient states that he and his wife continue to share a CBG meter. ( unable to check averages)  Recorded CBG in [facility] calendar include today's reading of 237, 1/15  171, 1/14 146, 1/13  95,    Patient states that he has requested a medication change to Cymbalta. States that primary MD will clear this with cardiology. Patient has been home monitoring and BP continues to be elevated with readings averaging 141-186/ 100's .  Patient states that he is has not called anyone to tell them. Reports BP elevated today when in primary MD office for follow up.    Objective:  Awake and alert.  Ambulating in the home without difficulty.  No swelling noted. Feet without sores or open skin.   Vitals:   12/28/16 1741  BP: (!) 162/88  Pulse: 65  Resp: 18  SpO2: 99%  Weight: 244 lb (110.7 kg)   Review of Systems  Constitutional:       Reports weak legs  HENT: Negative.   Eyes: Negative.   Respiratory: Negative.   Cardiovascular: Negative.   Gastrointestinal: Negative.   Genitourinary: Negative.   Musculoskeletal: Positive for falls and joint pain.  Skin: Negative.   Neurological: Negative.   Endo/Heme/Allergies:  Bruises/bleeds easily.  Psychiatric/Behavioral: Negative.     Physical Exam  Constitutional: He is oriented to person, place, and time. He appears well-developed and well-nourished.  Cardiovascular: Normal rate, regular rhythm, normal heart sounds and intact distal pulses.   Regular pulse   Respiratory: Effort normal and breath sounds normal.  GI: Soft. Bowel sounds are normal.  Musculoskeletal: He exhibits no edema.  Neurological: He is alert and oriented to person, place, and time.  Skin: Skin is warm and dry.  Psychiatric: He has a normal mood and affect. His behavior is normal. Judgment and thought content normal.    Encounter Medications:   Outpatient Encounter Prescriptions as of 12/28/2016  Medication Sig  . amiodarone (PACERONE) 200 MG tablet Take 1 tablet (200 mg total) by mouth daily.  Marland Kitchen aspirin EC 81 MG tablet Take 81 mg by mouth daily.  Marland Kitchen atorvastatin (LIPITOR) 20 MG tablet Take 1 tablet (20 mg total) by mouth daily at 6 PM.  . carvedilol (COREG) 25 MG tablet Take 1 tablet (25 mg total) by mouth 2 (two) times daily with a meal.  . FLUoxetine (PROZAC) 40 MG capsule Take 40 mg by mouth daily.  . insulin aspart protamine- aspart (NOVOLOG MIX 70/30) (70-30) 100 UNIT/ML injection Inject 35 Units into the skin 2 (two) times daily with a meal.  . nitroGLYCERIN (NITROSTAT) 0.4 MG SL tablet Place 1 tablet (0.4 mg total)  under the tongue every 5 (five) minutes x 3 doses as needed for chest pain.  Marland Kitchen omeprazole (PRILOSEC) 40 MG capsule Take 40 mg by mouth daily.  . sitaGLIPtin (JANUVIA) 100 MG tablet Take 100 mg by mouth daily.  . tamsulosin (FLOMAX) 0.4 MG CAPS capsule Take 1 capsule (0.4 mg total) by mouth daily.  . traZODone (DESYREL) 100 MG tablet Take 100 mg by mouth at bedtime as needed for sleep.  . valsartan (DIOVAN) 80 MG tablet Take 1 tablet (80 mg total) by mouth daily.  Marland Kitchen warfarin (COUMADIN) 1 MG tablet Take 1 tablet (1 mg total) by mouth one time only at 6 PM. (Patient  taking differently: Take 1 mg by mouth daily at 6 PM. )   No facility-administered encounter medications on file as of 12/28/2016.     Functional Status:   In your present state of health, do you have any difficulty performing the following activities: 11/29/2016 11/29/2016  Hearing? N Y  Vision? N Y  Difficulty concentrating or making decisions? N Y  Walking or climbing stairs? N Y  Dressing or bathing? N N  Doing errands, shopping? N Y  Conservation officer, nature and eating ? - N  Using the Toilet? - N  In the past six months, have you accidently leaked urine? - Y  Do you have problems with loss of bowel control? - N  Managing your Medications? - Y  Managing your Finances? - Y  Housekeeping or managing your Housekeeping? - Y  Some recent data might be hidden    Fall/Depression Screening:    PHQ 2/9 Scores 11/29/2016 10/07/2016  PHQ - 2 Score 5 0  PHQ- 9 Score 17 -   Fall Risk  12/28/2016 11/29/2016 10/07/2016  Falls in the past year? Yes Yes Yes  Number falls in past yr: 2 or more 2 or more 1  Injury with Fall? Yes Yes No  Risk Factor Category  High Fall Risk High Fall Risk -  Risk for fall due to : - History of fall(s) -  Follow up Falls prevention discussed Falls prevention discussed -   Assessment:   (1) frequent falls. Patient reports weak legs. (2) 1 dose of insulin left. Placed call to [facility] and patient has a copay of 242 ( which is part of his deductible)  Patient is able to purchase Relion insulin without a prescription for 25.00 for a 2 weeks supply. Today's reading of 237. (3)   States that he is following his diet. Reviewed compliance to diet. Reports that he is avoiding sweets.  (4) Sharing CBG meter with wife. (5) BP log indicates increased BP since hospital discharge.  Today's reading of 162/88 (6) wife filling medication box.  Concern if filled correctly. (7) weekly INR checks by primary MD.    Plan:   (1) Patient states that he would consider physical therapy.  Will notify MD. Encouraged patient to observe fall precautions. Warned patient of concern for bleeding after fall related to coumadin.  (2) Patient agrees to go pick up insulin today. Patient will also call MD and request samples.  (3) pharmacy provided patient with meal suggestions.  Patient reports that he has already had education about meals.  Encouraged patient to follow DM diet and limit bread, pasta, rice, potatoes.  (4) Encouraged patient to request new CBG meter prescription from MD to send to [facility] mail order pharmacy. Patient agreed to this. Will reassess.  (5) Reviewed parameter of when to call MD for elevated  BP.  Patient agrees to call cardiology with increased BP readings daily.  Patient will continue to monitor BP daily and record in [facility] calendar. Encouraged patient to exercise and follow low salt diet.  (6) [place] filled Med box for patient and reviewed how to take medications correctly. (7) encouraged patient to continue to have INR monitored per primary MD.  This note to be routed to MD.  Patient to call me for any assistance needed. Will send in basket message to [facility] social worker about continued financial concerns.  Pharmacy to contact cardiology regarding current BP readings.   Will continue weekly transition of care call. Next call in 7 days.  [facility] CM Care Plan Problem One   Flowsheet Row Most Recent Value  Care Plan Problem One  Recent hospitalization for paroxysmal a-flutter/a-fib, heart failure, hyperglycemia   Role Documenting the Problem One  Care Management [place] for Problem One  Active  THN Long Term Goal (31-90 days)  Patient will not have a hospitalization within the next 90 days  THN Long Term Goal Start Date  10/01/16  [facility] Long Term Goal Met Date  11/29/16  Interventions for Problem One Long Term Goal  Home visit completed today. Md office notified of concerns Reviewed with patient how to take medications and reasons to call MD.  [facility] CM  Short Term Goal #1 (0-30 days)  Patient will attend all scheduled appointments within the next 30 days  THN CM Short Term Goal #1 Start Date  10/01/16  [facility] CM Short Term Goal #1 Met Date  12/07/16  Interventions for Short Term Goal #1  Encouraged patient to attend MD visit today. States he will. Wife to drive.  THN CM Short Term Goal #2 (0-30 days)  Patient will adhere to taking all prescribed medications within the next 30 days  THN CM Short Term Goal #2 Start Date  10/01/16  [facility] CM Short Term Goal #2 Met Date  12/07/16  Interventions for Short Term Goal #2  Home visit completed. reviewed correct medications and doses.  Phamacy referral completed. Called MD office to inform of struggle to obtain medications.   THN CM Short Term Goal #3 (0-30 days)  Patient will reports monitoring and recording CBG daily in [facility] calendar for 30 days.   THN CM Short Term Goal #3 Start Date  11/22/16  Interventions for Short Tern Goal #3  encouraged patient to get his own meter.  reviewed with patient how to accomplish this task.     [facility] CM Care Plan Problem Two   Flowsheet Row Most Recent Value  Care Plan Problem Two  Recent admission for reoccurance of atrial fib.  Role Documenting the Problem Two  Care Management Coordinator  Care Plan for Problem Two  Active  Interventions for Problem Two Long Term Goal   Reviewed reasons to call MD. Encouraged patient to notify MD with elvated BP and heart rate.   THN Long Term Goal (31-90) days  Patient will report no readmission  for atrial fib in the next 60 days.  THN Long Term Goal Start Date  12/07/16  THN CM Short Term Goal #1 (0-30 days)  Patient will follow up with cardiology as scheduled in the next week.  THN CM Short Term Goal #1 Start Date  12/07/16  [facility] CM Short Term Goal #1 Met Date   12/20/16  Interventions for Short Term Goal #2   in basket message to [facility] social worker to assist. Encouraged  patient to attend appointment.  THN CM Short Term Goal #2 (0-30  days)  [place] will take all medication as prescribed in the next 30 days.   THN CM Short Term Goal #2 Start Date  12/07/16  Interventions for Short Term Goal #2  Encouraged patient to take meds as prescribed and call pharmacist if needed for assistance.      Tomasa Rand, RN, BSN, CEN [facility] [employer] [phone]

## 2017-01-03 NOTE — Telephone Encounter (Signed)
Call attempt made. Unable to reach patient at available number.

## 2017-01-04 ENCOUNTER — Other Ambulatory Visit: Payer: Self-pay

## 2017-01-04 ENCOUNTER — Ambulatory Visit: Payer: Self-pay | Admitting: Pharmacist

## 2017-01-04 DIAGNOSIS — R791 Abnormal coagulation profile: Secondary | ICD-10-CM | POA: Diagnosis not present

## 2017-01-04 NOTE — Patient Outreach (Signed)
Transition of care call: Placed call to patient for weekly transition of care. Patient reports that he is doing well with the exception of weak legs. Reports he almost fell twice today outside with the heavy wind.     Patient reports BP is better.  Unable to provide numbers. States last 3 CBG's of 69, 189, 149.  Reports that he was able to get the relion insulin and is taking it as prescribed. States heart rate of 50-65 bpm. Denies any racing or heart or dizziness. States that he has requested a new meter and prescription from his MD.    PLAN: Encouraged patient to call MD and inquire about physical therapy. Will continue weekly transition of care calls.   Tomasa Rand, RN, BSN, CEN [facility] [employer] [phone]

## 2017-01-05 ENCOUNTER — Other Ambulatory Visit: Payer: Self-pay | Admitting: Pharmacist

## 2017-01-05 NOTE — Patient Outreach (Addendum)
[facility]) Care Management  01/05/2017  [name] [name] 1951-05-07 [id number]  66 year old male referred to [facility] for medication assistance/medication adherence.  Patient reports one episode of hypoglycemia with a reading of 69 yesterday afternoon. Patient's BG readings yesterday were: 69, 189, and 149. Patient had not measured his blood sugars today yet. Encouraged the patient to take at least 1 reading this afternoon or evening before dinner or bedtime.  Patient purchased ReliOn insulin from [place] last week and says he has used about half of the vial so far. When asked about his plan for purchasing his insulin next week, patient states he will "have to wait until his check comes in", which will be February 2nd . He reports that he has called his doctor about samples but has not heard back.   Patient has received Tamsulosin and Amiodarone as well from [facility] mail order and has received a message from [facility] that the Trazodone is on its way. Went through the medication list with the patient to make sure he had a supply of all medications. Patient reports adherence with all medications.   Patient reports that he had an appointment yesterday and that they checked his INR. He reports he is now taking Warfarin 1.5 mg three days per week and 1 mg all other days. Patient denied signs and symptoms of bleeding.  Plan: Encouraged the patient to call the doctor's office back to check on the status of the samples and instructed patient to call back if he cannot obtain his next prescription of insulin. Contacted [name] for 90 day supply of Warfarin, Omeprazole, and Fluoxetine or Duloxetine (patient reports she may be switching to Duloxetine) and requested samples of insulin if available.  Will followup via telephone in 2 weeks to assess medication adherence/assistance. Instructed patient to ask provider to send 90 day prescriptions to [facility] mail order  pharmacy.  Bennye Alm, PharmD, BCPS [facility] PGY2 Pharmacy Resident [phone]  [facility] CM Care Plan Problem One   Flowsheet Row Most Recent Value  Care Plan Problem One  Medication adherence related to chronic disease state managment  Role Documenting the Problem One  Clinical Pharmacist  Care Plan for Problem One  Active  THN CM Short Term Goal #1 (0-30 days)  Patient will have adherence to his medications >80% of the time per patient report  THN CM Short Term Goal #1 Start Date  12/28/16  Interventions for Short Term Goal #1  Counseled on medication adherence and discussed obtaining medications from [facility] mail order

## 2017-01-10 ENCOUNTER — Telehealth: Payer: Self-pay | Admitting: Cardiology

## 2017-01-10 ENCOUNTER — Other Ambulatory Visit: Payer: Self-pay | Admitting: *Deleted

## 2017-01-10 NOTE — Telephone Encounter (Signed)
Returned call to patient  Patient states his BP is running a little bit high since after Christmas Patient states he is feeling "energy deprived" Patient denies chest pain, shortness of breath Patient takes valsartan 80mg  QAM & coreg 25mg  BID Patient generally checks BP 30 minutes after taking BP meds - advised to take 1-2 hours after  Jan 29  154/90 HR 59 Jan 18 182/105 Jan 17 184/109 Jan 16 174/111 Jan 15 178/107 Jan 14 176/100 Jan 13 178/108 Jan 12 165/102 Jan 11 160/100 HR is running 58-65 consistently   Routed to MD to advise

## 2017-01-10 NOTE — Telephone Encounter (Signed)
Returned call to patient Dr.Jordan's recommendations given.Advised to call back if B/P continues to be elevated.

## 2017-01-10 NOTE — Telephone Encounter (Signed)
New message   Pt wife verbalized that she is calling for a returned call from the rn that she has been waiting in wife   She wants to know if the doctor has agreed to increase the pt medications for his valsartan 80mg  and carvedilol 25mg 

## 2017-01-10 NOTE — Telephone Encounter (Signed)
Increase valsartan to 160 mg daily  Peter Martinique MD, [facility]

## 2017-01-10 NOTE — Patient Outreach (Signed)
[facility]) Care Management  [facility] Social Work  01/10/2017  [name] [name] 1951-02-22 [id number]  Subjective:  "we don't have gas to get to DSS or money to fill my insulin".  Objective:  Patient and wife to lessen their expenses to allow for money to be used for health and well-being.  Encounter Medications:  Outpatient Encounter Prescriptions as of 01/10/2017  Medication Sig  . amiodarone (PACERONE) 200 MG tablet Take 1 tablet (200 mg total) by mouth daily.  Marland Kitchen aspirin EC 81 MG tablet Take 81 mg by mouth daily.  Marland Kitchen atorvastatin (LIPITOR) 20 MG tablet Take 1 tablet (20 mg total) by mouth daily at 6 PM.  . carvedilol (COREG) 25 MG tablet Take 1 tablet (25 mg total) by mouth 2 (two) times daily with a meal.  . FLUoxetine (PROZAC) 40 MG capsule Take 40 mg by mouth daily.  . insulin aspart protamine- aspart (NOVOLOG MIX 70/30) (70-30) 100 UNIT/ML injection Inject 35 Units into the skin 2 (two) times daily with a meal.  . nitroGLYCERIN (NITROSTAT) 0.4 MG SL tablet Place 1 tablet (0.4 mg total) under the tongue every 5 (five) minutes x 3 doses as needed for chest pain.  Marland Kitchen omeprazole (PRILOSEC) 40 MG capsule Take 40 mg by mouth daily.  . sitaGLIPtin (JANUVIA) 100 MG tablet Take 100 mg by mouth daily.  . tamsulosin (FLOMAX) 0.4 MG CAPS capsule Take 1 capsule (0.4 mg total) by mouth daily.  . traZODone (DESYREL) 100 MG tablet Take 100 mg by mouth at bedtime as needed for sleep.  . valsartan (DIOVAN) 160 MG tablet Take 1 tablet (160 mg total) by mouth daily.  Marland Kitchen warfarin (COUMADIN) 1 MG tablet Take 1 tablet (1 mg total) by mouth one time only at 6 PM. (Patient taking differently: Take 1 mg by mouth daily at 6 PM. )   No facility-administered encounter medications on file as of 01/10/2017.     Functional Status:  In your present state of health, do you have any difficulty performing the following activities: 01/10/2017 11/29/2016  Hearing? N N  Vision? - N  Difficulty  concentrating or making decisions? - N  Walking or climbing stairs? - N  Dressing or bathing? - N  Doing errands, shopping? - N  Conservation officer, nature and eating ? N -  Using the Toilet? N -  In the past six months, have you accidently leaked urine? N -  Do you have problems with loss of bowel control? Y -  Managing your Medications? Y -  Managing your Finances? Y -  Housekeeping or managing your Housekeeping? Y -  Some recent data might be hidden    Fall/Depression Screening:  PHQ 2/9 Scores 01/10/2017 11/29/2016 10/07/2016  PHQ - 2 Score 2 5 0  PHQ- 9 Score 11 17 -    Assessment:   CSW met with patient and his wife in their home today. He is out of his insulin, no gas money to get to DSS, etc and needing food- all to get them through until they receive their SS checks on the 3rd.  Both are working to find ways to cut their monthly costs; had the cable bill cut in half, "smart shopping" and eliminating where they can (travel, etc). They shared with CSW that they have had to overdraft form their account to make means meet and pay bills.  They are on the waiting list for housing options that would allow them to lessen their rent costs.   Plan:  CSW provided a $50 gift card to them and stressed this as a one time gift from [place] and encouraged them to keep working to find ways to cut costs/budget.    CSW will send them material on budgeting and plan follow up call for updates.    [facility] CM Care Plan Problem One   Flowsheet Row Most Recent Value  Care Plan Problem One  Financial burdens/barriers.  Role Documenting the Problem One  Clinical Social Worker  Care Plan for Problem One  Active  THN CM Short Term Goal #1 (0-30 days)  Patient will determine ways to better manage budget to help with financial strains.  THN CM Short Term Goal #1 Start Date  01/10/17  Interventions for Short Term Goal #1  CSW provided gift card to assist with medical expenses, food and gas while encouraging patient and  wife to work on Field seismologist.      Eduard Clos, MSW, [place] Worker  [place] [phone]

## 2017-01-11 ENCOUNTER — Other Ambulatory Visit: Payer: Self-pay

## 2017-01-11 DIAGNOSIS — R791 Abnormal coagulation profile: Secondary | ICD-10-CM | POA: Diagnosis not present

## 2017-01-11 NOTE — Patient Outreach (Signed)
Transition of care call:  Placed call to patient who answered. States that he is okay. Reports that he went to his MD office today and had INR checked and levels were 1.4.  He was 1.9 last week. Reports that coumadin was adjusted to 2mg  daily. Patient reports that he has continued to have elevated BP. Reports that he call MD office and BP medication ( losartan was doubled). Reports that he took the increased dose yesterday.   Reports today's BP of 158/90.  CBG today of 158.  States that he has one more dose of insulin for tonight and will be out of insulin until he can get his check on Friday.  States that he talked with MD office and they did not have any samples.    Reports that he did see the social worker and received a 50.00 gift card that he used for food and gas.   PLAN: encouraged patient to call [facility] pharmacist  for assistance is any is available for insulin. Encouraged patient to continued to request samples from MD office. Sent in basket message to [place] to inform. Patient has successfully completed transition of care program and will plan to follow up in 1 month.  Tomasa Rand, RN, BSN, CEN [facility] [employer] [phone]

## 2017-01-12 ENCOUNTER — Other Ambulatory Visit: Payer: Self-pay | Admitting: Pharmacist

## 2017-01-12 NOTE — Patient Outreach (Signed)
[facility]) Care Management  01/12/2017  [name] [name] 1951-06-04 [id number]   Request from Eduard Clos, LCSW to mail patient [employer] brochure, [employer] transportation information and debt counseling information.   [name]  [facility] Care Management Assistant

## 2017-01-12 NOTE — Patient Outreach (Signed)
[facility]) Care Management  01/12/2017  [name] [name] 1951/05/01 [id number]    66 year old male referred to [place] for medication assistance/medication adherence.  Received notification yesterday from [facility] RN that patient is currently out of his Novolog 70/30 and he cannot afford the $25 to pick it up over the counter from [place] as this is his cheapest option.    Plan: Given patients financial difficulties [facility] pharmacy has agreed to pay for 3 month supply of Novolin 70/30 from [place] to give patient opportunity to align his financial difficulties and maintain medication adherence over the next 90 days [place] picked up 6 vials of Novolin 70/30 today from [place] over the counter and delivered to patient. [facility] pharmacy will followup next week to assess medication adherence.  Bennye Alm, PharmD, [place] PGY2 Pharmacy Resident [phone]

## 2017-01-18 DIAGNOSIS — Z7901 Long term (current) use of anticoagulants: Secondary | ICD-10-CM | POA: Diagnosis not present

## 2017-01-19 ENCOUNTER — Ambulatory Visit: Payer: Self-pay | Admitting: Pharmacist

## 2017-01-19 ENCOUNTER — Ambulatory Visit (INDEPENDENT_AMBULATORY_CARE_PROVIDER_SITE_OTHER): Payer: Medicare HMO | Admitting: *Deleted

## 2017-01-19 DIAGNOSIS — I255 Ischemic cardiomyopathy: Secondary | ICD-10-CM

## 2017-01-19 NOTE — Progress Notes (Signed)
Remote ICD transmission.   

## 2017-01-21 ENCOUNTER — Other Ambulatory Visit: Payer: Self-pay | Admitting: Pharmacist

## 2017-01-21 ENCOUNTER — Encounter: Payer: Self-pay | Admitting: Cardiology

## 2017-01-21 NOTE — Patient Outreach (Signed)
[facility]) Care Management  01/21/2017  [name] [name] 1951-01-07 [id number]   66 year old male referred to [place] for medication adherence/assistance.  Today he states he has all of his medications except he doesn't have fluoxetine and is waiting for his doctor to okay Cymbalta.  Reports he is not home currently. Reports highest CBG of 180 with most readings 140-150.   Reports last time he checked his BP was yesterday or day before.  Patient is not currently home and is unable to remember his last BP readings or review his medications over the phone.    Plan: [facility] will followup with patient via telephone next week to further assess medication adherence/assistance as he is not currently home   Bennye Alm, PharmD, [place] PGY2 Pharmacy Resident [phone]

## 2017-01-22 LAB — CUP PACEART REMOTE DEVICE CHECK
Battery Remaining Longevity: 62 mo
Battery Remaining Percentage: 57 %
Brady Statistic AP VS Percent: 1.6 %
Brady Statistic AS VP Percent: 1 %
Brady Statistic AS VS Percent: 98 %
Brady Statistic RA Percent Paced: 1.3 %
HighPow Impedance: 41 Ohm
Implantable Lead Implant Date: 20070516
Implantable Lead Implant Date: 20070516
Implantable Lead Location: 753860
Implantable Lead Model: 7001
Lead Channel Impedance Value: 330 Ohm
Lead Channel Pacing Threshold Amplitude: 0.5 V
Lead Channel Pacing Threshold Pulse Width: 0.5 ms
Lead Channel Pacing Threshold Pulse Width: 0.5 ms
Lead Channel Sensing Intrinsic Amplitude: 2.2 mV
Lead Channel Setting Pacing Amplitude: 2.5 V
Lead Channel Setting Sensing Sensitivity: 0.5 mV
MDC IDC LEAD LOCATION: 753859
MDC IDC MSMT BATTERY VOLTAGE: 2.95 V
MDC IDC MSMT LEADCHNL RA IMPEDANCE VALUE: 400 Ohm
MDC IDC MSMT LEADCHNL RV PACING THRESHOLD AMPLITUDE: 1.25 V
MDC IDC MSMT LEADCHNL RV SENSING INTR AMPL: 11.9 mV
MDC IDC PG IMPLANT DT: 20140407
MDC IDC PG SERIAL: 1056971
MDC IDC SESS DTM: 20180207103607
MDC IDC SET LEADCHNL RA PACING AMPLITUDE: 2 V
MDC IDC SET LEADCHNL RV PACING PULSEWIDTH: 0.5 ms
MDC IDC STAT BRADY AP VP PERCENT: 1 %
MDC IDC STAT BRADY RV PERCENT PACED: 1 %

## 2017-01-25 ENCOUNTER — Ambulatory Visit: Payer: Self-pay | Admitting: Pharmacist

## 2017-01-25 ENCOUNTER — Other Ambulatory Visit: Payer: Self-pay | Admitting: *Deleted

## 2017-01-25 NOTE — Patient Outreach (Signed)
[facility]) Care Management  [facility] Social Work  01/25/2017  [name] [name] 30-Mar-1951 [id number]  Subjective:  " We moved up the list for housing from #39 to #17"  Objective: Patient and wife will be linked with community resources/support to aide in their financial challenges.    Current Medications:  Current Outpatient Prescriptions  Medication Sig Dispense Refill  . amiodarone (PACERONE) 200 MG tablet Take 1 tablet (200 mg total) by mouth daily. 90 tablet 2  . aspirin EC 81 MG tablet Take 81 mg by mouth daily.    Marland Kitchen atorvastatin (LIPITOR) 20 MG tablet Take 1 tablet (20 mg total) by mouth daily at 6 PM. 90 tablet 2  . carvedilol (COREG) 25 MG tablet Take 1 tablet (25 mg total) by mouth 2 (two) times daily with a meal. 180 tablet 2  . FLUoxetine (PROZAC) 40 MG capsule Take 40 mg by mouth daily.    . insulin aspart protamine- aspart (NOVOLOG MIX 70/30) (70-30) 100 UNIT/ML injection Inject 35 Units into the skin 2 (two) times daily with a meal.    . nitroGLYCERIN (NITROSTAT) 0.4 MG SL tablet Place 1 tablet (0.4 mg total) under the tongue every 5 (five) minutes x 3 doses as needed for chest pain. 25 tablet 12  . omeprazole (PRILOSEC) 40 MG capsule Take 40 mg by mouth daily.    . sitaGLIPtin (JANUVIA) 100 MG tablet Take 100 mg by mouth daily.    . tamsulosin (FLOMAX) 0.4 MG CAPS capsule Take 1 capsule (0.4 mg total) by mouth daily. 30 capsule 0  . traZODone (DESYREL) 100 MG tablet Take 100 mg by mouth at bedtime as needed for sleep.    . valsartan (DIOVAN) 160 MG tablet Take 1 tablet (160 mg total) by mouth daily. 30 tablet 6  . warfarin (COUMADIN) 1 MG tablet Take 1 tablet (1 mg total) by mouth one time only at 6 PM. (Patient taking differently: Take 1 mg by mouth daily at 6 PM. ) 30 tablet 0   No current facility-administered medications for this visit.     Functional Status:  In your present state of health, do you have any difficulty performing the following  activities: 01/10/2017 11/29/2016  Hearing? N N  Vision? - N  Difficulty concentrating or making decisions? - N  Walking or climbing stairs? - N  Dressing or bathing? - N  Doing errands, shopping? - N  Conservation officer, nature and eating ? N -  Using the Toilet? N -  In the past six months, have you accidently leaked urine? N -  Do you have problems with loss of bowel control? Y -  Managing your Medications? Y -  Managing your Finances? Y -  Housekeeping or managing your Housekeeping? Y -  Some recent data might be hidden    Fall/Depression Screening:  PHQ 2/9 Scores 01/10/2017 11/29/2016 10/07/2016  PHQ - 2 Score 2 5 0  PHQ- 9 Score 11 17 -    Assessment: CSW called patient who reports they were able to use the gift card provided "to make ends meet" by using it towards groceries and gas.Patient and wife very appreciative of the assistance.  Patient reportsthey have moved up the waiting list for housing form #39 to #17.They also are working with DSS to determine eligibility for Medicaid.  Plan: CSW encouraged him to continue communicating and working on budgeting, medicaid, housing, Social research officer, government.  CSW will plan f/u call in the next 2 weeks. Eduard Clos, MSW, LCSW Clinical  Social Worker  [place] [phone]

## 2017-01-26 ENCOUNTER — Ambulatory Visit: Payer: Self-pay | Admitting: Pharmacist

## 2017-01-26 ENCOUNTER — Other Ambulatory Visit: Payer: Self-pay | Admitting: Pharmacist

## 2017-01-26 DIAGNOSIS — I10 Essential (primary) hypertension: Secondary | ICD-10-CM | POA: Diagnosis not present

## 2017-01-26 DIAGNOSIS — E785 Hyperlipidemia, unspecified: Secondary | ICD-10-CM | POA: Diagnosis not present

## 2017-01-26 DIAGNOSIS — Z6833 Body mass index (BMI) 33.0-33.9, adult: Secondary | ICD-10-CM | POA: Diagnosis not present

## 2017-01-26 DIAGNOSIS — E119 Type 2 diabetes mellitus without complications: Secondary | ICD-10-CM | POA: Diagnosis not present

## 2017-01-26 DIAGNOSIS — F329 Major depressive disorder, single episode, unspecified: Secondary | ICD-10-CM | POA: Diagnosis not present

## 2017-01-26 DIAGNOSIS — R791 Abnormal coagulation profile: Secondary | ICD-10-CM | POA: Diagnosis not present

## 2017-01-26 DIAGNOSIS — E669 Obesity, unspecified: Secondary | ICD-10-CM | POA: Diagnosis not present

## 2017-01-26 DIAGNOSIS — I4891 Unspecified atrial fibrillation: Secondary | ICD-10-CM | POA: Diagnosis not present

## 2017-01-26 NOTE — Patient Outreach (Signed)
[facility] [facility]) Care Management  01/26/2017  RENALDO GORNICK 1951/11/13 [id number]   66 year old male referred to [place] for medication adherence/assistance.  Was unable to reach patient today via telephone.  I have left a HIPAA compliant voicemail asking him to return my call.  Plan: Followup via telephone next week.   Bennye Alm, PharmD, [place] PGY2 Pharmacy Resident [phone]

## 2017-02-01 ENCOUNTER — Other Ambulatory Visit: Payer: Self-pay | Admitting: Pharmacist

## 2017-02-01 NOTE — Patient Outreach (Signed)
[facility]) Care Management  [name] [name] [birth year] [id number]  66 year old male referred to [place] for medication adherence/medication assistance. Called patient today to discuss medication adherence and he reported some low BP readings and also states he is almost out of insulin syringes.  Conducted home visit to assess blood pressure and BP readings he reported via telephone were incorrect and he had not been checking very often over the last several days  Patient reports he is getting ready to move and states the housing authority has contacted him.  He reports the price should be cheaper ($500 vs $380).  He is moving March 2nd.  He reports his provider has okay'd Cymbalta and a 30 day supply was sent to [facility] until it can be sent to [facility]. Reports missing his daytime medications maybe 1-2 times per week but seldom misses his nighttime medications.   Denies signs/symptoms of bleeding with Warfarin.  Patient Still has a couple weeks of Januvia.     Needs new rx sent for Valsartan 160 mg daily.    Reports he hasnt seen CBGs in 300s in a long time.  Reports checking CBGs 2-3 times a day and most values are <200 mg/dL.  Reports hypoglycemia <70 mg/dL 1-2 times per week which he treats with regular soda.   During home visit I was unable to view patients CBGs as him and his wife both use the same meter.    Home BP Readings (Has not been checking regularly) Date BP HR  2/20 139/85 63  2/20 144/85 65  2/11 170/99 67  2/11 148/92 70  2/1 162/96 62  1/30 158/90 60  1/29 154/90 59    Plan: Will contact patients cardiologist to send new prescription for Valsartan 160 mg daily  Instructed patient to Check BP more often (5 days/week) and to write down CBG values  Counseled on signs/symptoms/treatment of hypoglycemia.  Followup in 1 week via telephone.   Bennye Alm, PharmD, BCPS [facility] PGY2 Pharmacy Resident [phone]  [facility] CM Care Plan Problem One    Flowsheet Row Most Recent Value  Care Plan Problem One  Medication adherence related to chronic disease state managment  Role Documenting the Problem One  Clinical Pharmacist  Care Plan for Problem One  Active  THN CM Short Term Goal #1 (0-30 days)  Patient will have adherence to his medications >80% of the time per patient report  THN CM Short Term Goal #1 Start Date  12/28/16  [facility] CM Short Term Goal #1 Met Date  02/01/17  Interventions for Short Term Goal #1  Counseled on medication adherence and discussed obtaining medications from [facility] mail order    [place] Problem Three   Flowsheet Row Most Recent Value  Care Plan Problem Three  Noncompliance with self monitoring of blood glucose and blood pressure  Role Documenting the Problem Three  Clinical Pharmacist  Care Plan for Problem Three  Active  THN CM Short Term Goal #1 (0-30 days)  Patient will check blood pressure at least 5 times per week over the next 14 days as evidenced by BP readings at next visit  [facility] CM Short Term Goal #1 Start Date  02/01/17  Interventions for Short Term Goal #1  Counseled on importance of checking blood pressure at home to better manage hypertension  THN CM Short Term Goal #2 (0-30 days)  Patient will check CBGs at least twice daily over the next 14 days and write values  down as measured by CBG readings at next visit  [facility] CM Short Term Goal #2 Start Date  02/01/17  Interventions for Short Term Goal #2  Discussed importance of checking CBGs. Counseled on hypoglycemia.

## 2017-02-04 ENCOUNTER — Encounter: Payer: Self-pay | Admitting: Cardiology

## 2017-02-04 DIAGNOSIS — Z7901 Long term (current) use of anticoagulants: Secondary | ICD-10-CM | POA: Diagnosis not present

## 2017-02-07 ENCOUNTER — Other Ambulatory Visit: Payer: Self-pay | Admitting: *Deleted

## 2017-02-07 NOTE — Patient Outreach (Signed)
[facility] [facility]) Care Management  [facility] Social Work  02/07/2017  [name] [name] [birth year] [id number]  Subjective:  "we are moving to housing next week" Objective: CSW to assist patient with commuity based resources to aide in his well-being, quality of life and overall safety/needs including financial needs/support.   Current Medications:  Current Outpatient Prescriptions  Medication Sig Dispense Refill  . amiodarone (PACERONE) 200 MG tablet Take 1 tablet (200 mg total) by mouth daily. 90 tablet 2  . aspirin EC 81 MG tablet Take 81 mg by mouth daily.    Marland Kitchen atorvastatin (LIPITOR) 20 MG tablet Take 1 tablet (20 mg total) by mouth daily at 6 PM. 90 tablet 2  . carvedilol (COREG) 25 MG tablet Take 1 tablet (25 mg total) by mouth 2 (two) times daily with a meal. 180 tablet 2  . FLUoxetine (PROZAC) 40 MG capsule Take 40 mg by mouth daily.    . insulin aspart protamine- aspart (NOVOLOG MIX 70/30) (70-30) 100 UNIT/ML injection Inject 35 Units into the skin 2 (two) times daily with a meal.    . nitroGLYCERIN (NITROSTAT) 0.4 MG SL tablet Place 1 tablet (0.4 mg total) under the tongue every 5 (five) minutes x 3 doses as needed for chest pain. 25 tablet 12  . omeprazole (PRILOSEC) 40 MG capsule Take 40 mg by mouth daily.    . sitaGLIPtin (JANUVIA) 100 MG tablet Take 100 mg by mouth daily.    . tamsulosin (FLOMAX) 0.4 MG CAPS capsule Take 1 capsule (0.4 mg total) by mouth daily. 30 capsule 0  . traZODone (DESYREL) 100 MG tablet Take 100 mg by mouth at bedtime as needed for sleep.    . valsartan (DIOVAN) 160 MG tablet Take 1 tablet (160 mg total) by mouth daily. 30 tablet 6  . warfarin (COUMADIN) 1 MG tablet Take 1 tablet (1 mg total) by mouth one time only at 6 PM. (Patient taking differently: Take 1 mg by mouth daily at 6 PM. ) 30 tablet 0   No current facility-administered medications for this visit.     Functional Status:  In your present state of health, do you have any  difficulty performing the following activities: 01/10/2017 11/29/2016  Hearing? N N  Vision? - N  Difficulty concentrating or making decisions? - N  Walking or climbing stairs? - N  Dressing or bathing? - N  Doing errands, shopping? - N  Conservation officer, nature and eating ? N -  Using the Toilet? N -  In the past six months, have you accidently leaked urine? N -  Do you have problems with loss of bowel control? Y -  Managing your Medications? Y -  Managing your Finances? Y -  Housekeeping or managing your Housekeeping? Y -  Some recent data might be hidden    Fall/Depression Screening:  PHQ 2/9 Scores 01/10/2017 11/29/2016 10/07/2016  PHQ - 2 Score 2 5 0  PHQ- 9 Score 11 17 -    Assessment:  CSW spoke with patient and his wife by phone today. "We're moving to housing authority apartment and have to be out of here by Friday". Patient and his wife were notified of housing wait list opportunity to move where they will save money each month.  The rent is less and it includes power and water. They are pleased and excited.  Per patient's wife, the Medicaid is still pending.  They also have had some car problems today but their daughter is able to help pay  for repairs and allow them to pay her back as they can.  Patient and wife are encouraged by the positive things occurring to aide them in their budgeting/finances.  No concerns or needs voiced or identified for CSW at this time.  Plan: CSW will advise [facility] team of their plans to move to [address] [place], [place] [address]. CSW will plan f/u call in 2- 3 weeks.     [facility] CM Care Plan Problem One   Flowsheet Row Most Recent Value  Care Plan Problem One  (P) Financial burdens/barriers.  Role Documenting the Problem One  (P) Clinical Social Worker  Care Plan for Problem One  (P) Active  THN CM Short Term Goal #1 (0-30 days)  (P) Patient will determine ways to better manage budget to help with financial strains.  THN CM Short Term Goal #1 Start Date   (P) 01/10/17  Interventions for Short Term Goal #1  (P) CSW provided gift card to assist with medical expenses, food and gas while encouraging patient and wife to work on Field seismologist.      Eduard Clos, MSW, [place] Worker  [place] [phone]

## 2017-02-08 ENCOUNTER — Other Ambulatory Visit: Payer: Self-pay

## 2017-02-08 NOTE — Patient Outreach (Signed)
Telephone assessment 60 days post discharge:  Placed call to patient who reports that he is doing well. States that his legs continue to feel weak. Reports he stood on a chair to change a light bulb and fell.  Reviewed with patient that this was not a good idea as he is on coumadin. Patient denies any injury.  Patient reports CBG remain under 200. Unable to provide me with today's reading. States that his hgb A1c is down to 8.4 from 13.  Encouraged patient to continue to take his medications as prescribed.   States BP running 140-150/80-90.  States no episodes of atrial fib or racing heart.   Patient was excited to tell me that he is moving on 02/11/2017 to cheaper housing. States that he and his wife have been busy packing. Reports that the local church is going to help him move.  Reports he will save some money and that utilities are included.   PLAN: reviewed with patient that he has completed his nursing goals successfully.  Reviewed case closure from nursing only at this time and patient is in agreement. Patient is still active with [facility] pharmacy and [facility] social worker.  Will send update to MD. Encouraged patient to call in the future if he needs assistance from a nurse.  Tomasa Rand, RN, BSN, CEN [facility] [employer] [phone]

## 2017-02-09 ENCOUNTER — Other Ambulatory Visit: Payer: Self-pay | Admitting: Pharmacist

## 2017-02-09 NOTE — Patient Outreach (Signed)
[place] [facility]) Care Management  02/09/2017  [name] [name] 07-Jan-1951 [id number]   66 year old male referred to [place] for medication adherence/medication assistance.  Called patient to assess CBG and BP values today.  Patient states most CBGs < 200 mg/dL and that he did have one low CBG last night of 70 mg/dL in which he treated appropriately.  He also reports his most recent A1C was 8.4%.   Patient reports he has been checking his blood pressure periodically (busy due to moving on 02/11/17) and his BP today was 128/73.  He also reports his heart rates have been running in 50-60s.  Patient denies chest pain, signs/symptoms of bleeding, or heart palpitations.  He is very excited about moving very soon and does state he has all of his medications currently ([place] previously assisted patient in obtaining insulin).   Plan: Followup via telephone in 3 weeks once patient has moved to his new home Will further assess adherence, medication assistance, CBGs and BP during that visit.   Bennye Alm, PharmD, BCPS [facility] PGY2 Pharmacy Resident [phone]  [facility] CM Care Plan Problem One   Flowsheet Row Most Recent Value  Care Plan Problem One  Medication adherence related to chronic disease state managment  Role Documenting the Problem One  Clinical Pharmacist  Care Plan for Problem One  Active  THN CM Short Term Goal #1 (0-30 days)  Patient will have adherence to his medications >80% of the time per patient report  THN CM Short Term Goal #1 Start Date  12/28/16  [facility] CM Short Term Goal #1 Met Date  02/01/17  Interventions for Short Term Goal #1  Counseled on medication adherence and discussed obtaining medications from [facility] mail order    [place] Problem Three   Flowsheet Row Most Recent Value  Care Plan Problem Three  Noncompliance with self monitoring of blood glucose and blood pressure  Role Documenting the Problem Three  Clinical Pharmacist  Care Plan for  Problem Three  Active  THN CM Short Term Goal #1 (0-30 days)  Patient will check blood pressure at least 5 times per week over the next 14 days as evidenced by BP readings at next visit  [facility] CM Short Term Goal #1 Start Date  02/09/17  Interventions for Short Term Goal #1  Counseled on importance of checking blood pressure at home to better manage hypertension  THN CM Short Term Goal #2 (0-30 days)  Patient will check CBGs at least twice daily over the next 14 days and write values down as measured by CBG readings at next visit  [facility] CM Short Term Goal #2 Start Date  02/09/17  Interventions for Short Term Goal #2  Discussed importance of checking CBGs. Counseled on hypoglycemia.

## 2017-02-16 DIAGNOSIS — Z7901 Long term (current) use of anticoagulants: Secondary | ICD-10-CM | POA: Diagnosis not present

## 2017-02-17 DIAGNOSIS — R0602 Shortness of breath: Secondary | ICD-10-CM | POA: Diagnosis not present

## 2017-02-17 DIAGNOSIS — H5710 Ocular pain, unspecified eye: Secondary | ICD-10-CM | POA: Diagnosis not present

## 2017-02-17 DIAGNOSIS — H4311 Vitreous hemorrhage, right eye: Secondary | ICD-10-CM | POA: Diagnosis not present

## 2017-02-17 DIAGNOSIS — H547 Unspecified visual loss: Secondary | ICD-10-CM | POA: Diagnosis not present

## 2017-02-17 DIAGNOSIS — H5461 Unqualified visual loss, right eye, normal vision left eye: Secondary | ICD-10-CM | POA: Diagnosis not present

## 2017-02-17 DIAGNOSIS — H539 Unspecified visual disturbance: Secondary | ICD-10-CM | POA: Diagnosis not present

## 2017-02-21 ENCOUNTER — Ambulatory Visit: Payer: Medicare HMO | Admitting: Cardiology

## 2017-02-21 ENCOUNTER — Telehealth: Payer: Self-pay | Admitting: Cardiology

## 2017-02-21 NOTE — Telephone Encounter (Signed)
Spoke w/ pt and requested that he send a manual transmission b/c his home monitor has not updated in at least 7 days.   

## 2017-02-23 DIAGNOSIS — F329 Major depressive disorder, single episode, unspecified: Secondary | ICD-10-CM | POA: Diagnosis not present

## 2017-02-23 DIAGNOSIS — M2550 Pain in unspecified joint: Secondary | ICD-10-CM | POA: Diagnosis not present

## 2017-02-23 DIAGNOSIS — E669 Obesity, unspecified: Secondary | ICD-10-CM | POA: Diagnosis not present

## 2017-02-23 DIAGNOSIS — Z6834 Body mass index (BMI) 34.0-34.9, adult: Secondary | ICD-10-CM | POA: Diagnosis not present

## 2017-02-24 ENCOUNTER — Other Ambulatory Visit: Payer: Self-pay | Admitting: *Deleted

## 2017-02-24 NOTE — Patient Outreach (Signed)
[facility]) Care Management  [facility] Social Work  02/24/2017  [name] [name] [birth year] [id number]  Subjective:  "I need to get a copy of my birth certificate for the new apartment people".  Objective: CSW to assist patient with commuity based resources to aide in her well-being, quality of life and overall safety/needs.    Current Medications:  Current Outpatient Prescriptions  Medication Sig Dispense Refill  . amiodarone (PACERONE) 200 MG tablet Take 1 tablet (200 mg total) by mouth daily. 90 tablet 2  . aspirin EC 81 MG tablet Take 81 mg by mouth daily.    Marland Kitchen atorvastatin (LIPITOR) 20 MG tablet Take 1 tablet (20 mg total) by mouth daily at 6 PM. 90 tablet 2  . carvedilol (COREG) 25 MG tablet Take 1 tablet (25 mg total) by mouth 2 (two) times daily with a meal. 180 tablet 2  . FLUoxetine (PROZAC) 40 MG capsule Take 40 mg by mouth daily.    . insulin aspart protamine- aspart (NOVOLOG MIX 70/30) (70-30) 100 UNIT/ML injection Inject 35 Units into the skin 2 (two) times daily with a meal.    . nitroGLYCERIN (NITROSTAT) 0.4 MG SL tablet Place 1 tablet (0.4 mg total) under the tongue every 5 (five) minutes x 3 doses as needed for chest pain. 25 tablet 12  . omeprazole (PRILOSEC) 40 MG capsule Take 40 mg by mouth daily.    . sitaGLIPtin (JANUVIA) 100 MG tablet Take 100 mg by mouth daily.    . tamsulosin (FLOMAX) 0.4 MG CAPS capsule Take 1 capsule (0.4 mg total) by mouth daily. 30 capsule 0  . traZODone (DESYREL) 100 MG tablet Take 100 mg by mouth at bedtime as needed for sleep.    . valsartan (DIOVAN) 160 MG tablet Take 1 tablet (160 mg total) by mouth daily. 30 tablet 6  . warfarin (COUMADIN) 1 MG tablet Take 1 tablet (1 mg total) by mouth one time only at 6 PM. (Patient taking differently: Take 1 mg by mouth daily at 6 PM. ) 30 tablet 0   No current facility-administered medications for this visit.     Functional Status:  In your present state of health, do you have  any difficulty performing the following activities: 01/10/2017 11/29/2016  Hearing? N N  Vision? - N  Difficulty concentrating or making decisions? - N  Walking or climbing stairs? - N  Dressing or bathing? - N  Doing errands, shopping? - N  Conservation officer, nature and eating ? N -  Using the Toilet? N -  In the past six months, have you accidently leaked urine? N -  Do you have problems with loss of bowel control? Y -  Managing your Medications? Y -  Managing your Finances? Y -  Housekeeping or managing your Housekeeping? Y -  Some recent data might be hidden    Fall/Depression Screening:  PHQ 2/9 Scores 02/24/2017 01/10/2017 11/29/2016 10/07/2016  PHQ - 2 Score 2 2 5  0  PHQ- 9 Score - 11 17 -    Assessment:  CSW spoke with patient by phone today- he reports they are all moved in to their new home and are "dealing with boxes" now.  Patient shared with CSW that the apartment staff are requesting a copy of his birth certificate. He does not have a copy and reports facing some barriers with getting a new one because of a name change when he was a child. CSW helped to problem solve possible ways to get this  and will continue to seek resolution but advised him of uncertainty.    Plan:  CSW will investigate options to possibly help and follow up with patient.      Eduard Clos, MSW, [place] Worker  [place] [phone]

## 2017-02-25 ENCOUNTER — Telehealth: Payer: Self-pay | Admitting: Cardiology

## 2017-02-25 DIAGNOSIS — E113512 Type 2 diabetes mellitus with proliferative diabetic retinopathy with macular edema, left eye: Secondary | ICD-10-CM | POA: Diagnosis not present

## 2017-02-25 DIAGNOSIS — H43812 Vitreous degeneration, left eye: Secondary | ICD-10-CM | POA: Diagnosis not present

## 2017-02-25 DIAGNOSIS — H4311 Vitreous hemorrhage, right eye: Secondary | ICD-10-CM | POA: Diagnosis not present

## 2017-02-25 NOTE — Telephone Encounter (Signed)
Received a call from patient he stated he started having blurred vision in right eye last weekend.He is bleeding behind right eye.Stated Dr.Sanders scheduled him to have surgery next Thursday 03/03/17,he starts holding coumadin tonight.He wanted Dr.Jordan to know.Message sent to [place].

## 2017-02-25 NOTE — Telephone Encounter (Signed)
Ok I am aware. He needs to get that taken care. Ok to come off coumadin for that  Zyniah Ferraiolo [place] MD, [facility]

## 2017-02-25 NOTE — Telephone Encounter (Signed)
Returned call to patient Dr.Jordan's advice given.

## 2017-02-28 ENCOUNTER — Ambulatory Visit: Payer: Medicare HMO | Admitting: Cardiology

## 2017-03-01 ENCOUNTER — Other Ambulatory Visit: Payer: Self-pay | Admitting: *Deleted

## 2017-03-02 ENCOUNTER — Ambulatory Visit: Payer: Self-pay | Admitting: Pharmacist

## 2017-03-02 NOTE — Patient Outreach (Signed)
[name]) Care Management  03/02/2017  [name] [name] 11-25-1951 [id number]   Patient reports that he his having eye surgery 03/03/17 for a retina issue. "I will need to have the other eye done too". He and his wife have made arrangements for paying the copay in installments. "I can't afford to not do do it but I can't afford to do them both".  Patient said his wife has gotten some kind of "medical card" to pay for it and will be able to make payments (with interest) to pay it off.   Patient in good spirits; optimistic about the procedure. CSW will plan f/u call in next 2 weeks.    Eduard Clos, MSW, [place] Worker  [place] [phone]

## 2017-03-03 DIAGNOSIS — H348111 Central retinal vein occlusion, right eye, with retinal neovascularization: Secondary | ICD-10-CM | POA: Diagnosis not present

## 2017-03-03 DIAGNOSIS — H34211 Partial retinal artery occlusion, right eye: Secondary | ICD-10-CM | POA: Diagnosis not present

## 2017-03-03 DIAGNOSIS — H4311 Vitreous hemorrhage, right eye: Secondary | ICD-10-CM | POA: Diagnosis not present

## 2017-03-04 DIAGNOSIS — H34211 Partial retinal artery occlusion, right eye: Secondary | ICD-10-CM | POA: Diagnosis not present

## 2017-03-09 ENCOUNTER — Other Ambulatory Visit: Payer: Self-pay | Admitting: Pharmacist

## 2017-03-09 ENCOUNTER — Ambulatory Visit: Payer: Self-pay | Admitting: Pharmacist

## 2017-03-09 NOTE — Patient Outreach (Signed)
[place] [facility]) Care Management  03/09/2017  [name] [name] 10/06/1951 [id number]  66 y.o. year old male referred to [place] for Medication Adherence (Pharmacy Telephone Outreach) and Medication Assistance Was unable to reach patient via telephone today and have left HIPAA compliant voicemail asking him to return my call (unsuccessful outreach #1).  Plan: Followup in 1 week via telephone  Bennye Alm, PharmD, [place] PGY2 Pharmacy Resident [phone]

## 2017-03-15 ENCOUNTER — Other Ambulatory Visit: Payer: Self-pay | Admitting: Pharmacist

## 2017-03-15 ENCOUNTER — Other Ambulatory Visit: Payer: Self-pay | Admitting: *Deleted

## 2017-03-15 NOTE — Patient Outreach (Signed)
[facility]) Care Management  03/15/2017  ARTEZ REGIS [birth year] [id number]  66 year old male referred to [place] for medication adherence/medication assistance. Called patient today via telephone and he states that he has been moved since the 2nd of March to his new apartment which has saved him some money except for some extra expenses with a security deposit, moving cost, and recent eye surgery ($295 copay).  He reports the eye surgery a couple weeks ago went well and his vision has improved. He plans to pay his bills today and he will know what is remaining to possibly pay for his insulin. Patient believes he can afford his insulin in 1-2 months once he gets his finances in order.   Today patient states that he misplaced his insulin during his move and he cannot find his insulin.  He has been out of insulin for ~7 days.   He has not been checking his blood sugars or blood pressure as he has misplaced both devices during his move.    Assessment:  Medication Assistance: [facility] Medicare patient that cannot afford the copay for his insulin or his Januvia.  Patient is currently in the Medicare deductible phase. Patient has been denied low income subsidy/extra help. Patient eligible for Tier 1/2 90 day $0 copay (exceptions include 70/30 insulin and Januvia which are $47/month copay) for his other medications.  Patient currently out of insulin for ~7 days due to lost medication from recent move. Patient unable to afford insulin due to recent medical/housing expenses.    Plan: [facility] in [place] and they are out of Novolin 70/30 over the counter until a shipment arrives later today [place] will plan to pay for 1 month of Novolin 70/30 over the counter and plan to reassess patient clinical and financial goals after 1 month Will plan home visit for tomorrow and have instructed patient to find CBG and BP monitor prior to visit tomorrow. Will plan to  assess medication management and adherence during home visit tomorrow.   Bennye Alm, PharmD, [place] PGY2 Pharmacy Resident [phone]

## 2017-03-15 NOTE — Patient Outreach (Signed)
[place] [facility]) Care Management  [facility] Social Work  03/15/2017  [name] [name] 27-Feb-1951 [id number]  Subjective:  " I can see like an eagle". Patient had eye surgery which ended up being a blood clot and his vision has been restore.  Objective: CSW to assist patient with commuity based resources to aide in her well-being, quality of life and overall safety/needs.    Current Medications:  Current Outpatient Prescriptions  Medication Sig Dispense Refill  . amiodarone (PACERONE) 200 MG tablet Take 1 tablet (200 mg total) by mouth daily. 90 tablet 2  . aspirin EC 81 MG tablet Take 81 mg by mouth daily.    Marland Kitchen atorvastatin (LIPITOR) 20 MG tablet Take 1 tablet (20 mg total) by mouth daily at 6 PM. 90 tablet 2  . carvedilol (COREG) 25 MG tablet Take 1 tablet (25 mg total) by mouth 2 (two) times daily with a meal. 180 tablet 2  . FLUoxetine (PROZAC) 40 MG capsule Take 40 mg by mouth daily.    . insulin aspart protamine- aspart (NOVOLOG MIX 70/30) (70-30) 100 UNIT/ML injection Inject 35 Units into the skin 2 (two) times daily with a meal.    . nitroGLYCERIN (NITROSTAT) 0.4 MG SL tablet Place 1 tablet (0.4 mg total) under the tongue every 5 (five) minutes x 3 doses as needed for chest pain. 25 tablet 12  . omeprazole (PRILOSEC) 40 MG capsule Take 40 mg by mouth daily.    . sitaGLIPtin (JANUVIA) 100 MG tablet Take 100 mg by mouth daily.    . tamsulosin (FLOMAX) 0.4 MG CAPS capsule Take 1 capsule (0.4 mg total) by mouth daily. 30 capsule 0  . traZODone (DESYREL) 100 MG tablet Take 100 mg by mouth at bedtime as needed for sleep.    . valsartan (DIOVAN) 160 MG tablet Take 1 tablet (160 mg total) by mouth daily. 30 tablet 6  . warfarin (COUMADIN) 1 MG tablet Take 1 tablet (1 mg total) by mouth one time only at 6 PM. (Patient taking differently: Take 1 mg by mouth daily at 6 PM. ) 30 tablet 0   No current facility-administered medications for this visit.     Functional Status:  In  your present state of health, do you have any difficulty performing the following activities: 01/10/2017 11/29/2016  Hearing? N N  Vision? - N  Difficulty concentrating or making decisions? - N  Walking or climbing stairs? - N  Dressing or bathing? - N  Doing errands, shopping? - N  Conservation officer, nature and eating ? N -  Using the Toilet? N -  In the past six months, have you accidently leaked urine? N -  Do you have problems with loss of bowel control? Y -  Managing your Medications? Y -  Managing your Finances? Y -  Housekeeping or managing your Housekeeping? Y -  Some recent data might be hidden    Fall/Depression Screening:  PHQ 2/9 Scores 02/24/2017 01/10/2017 11/29/2016 10/07/2016  PHQ - 2 Score _0 0  PHQ- 9 Score - 11 17 -    Assessment: CSW spoke with patient by phone today. They have moved, are settled and he reports all is well. He is pleased to have had his vision restored after fearing it may be permanent- "it was not due to diabetes afterall".  Patient does report he has misplaced some of his insulin with the move and has gone without for about 6 days. CSW will advise [facility] RPH of this.  Plan:  CSW discussed closure of CSW referral as goals and needs have been met.  Patient agrees to this; is appreciative of all the support and services provided by CSW.  CSW will notify PCP and [facility] team of CSW case closure.    THN CM Care Plan Problem One     Most Recent Value  Care Plan Problem One  (P) Financial burdens/barriers.  Role Documenting the Problem One  (P) Clinical Social Worker  Care Plan for Problem One  (P) Active  THN CM Short Term Goal #1 (0-30 days)  (P) Patient will determine ways to better manage budget to help with financial strains.  THN CM Short Term Goal #1 Start Date  (P) 01/10/17  THN CM Short Term Goal #1 Met Date  (P) 02/07/17  Interventions for Short Term Goal #1  (P) CSW provided gift card to assist with medical expenses, food and gas while encouraging  patient and wife to work on Field seismologist.        Eduard Clos, MSW, [place] Worker  [place] [phone]

## 2017-03-16 ENCOUNTER — Other Ambulatory Visit: Payer: Self-pay | Admitting: Pharmacist

## 2017-03-22 NOTE — Patient Outreach (Signed)
[facility] [facility]) Care Management  [place]   03/16/2017  [name] [name] [birth year] [id number]  Subjective: 66 year old male referred to [place] for medication adherence/medication assistance. Pharmacy home visit performed today as patient has been out of insulin for > 10 days.  Patient misplaced his insulin following his move to a new apartment.  The insulin was part of a 3 month supply purchased by the [facility] emergency fund.  Patient has extra expenses including eye surgery, security deposit and moving costs but him and his wife believe they will be able to afford the $50 cost for insulin (Novolin 70/30 over the counter from [place]) as soon as next month. He also reports that his housing will deduct cost of medications from his rent if provided.   Patient reports hypoglycemia once per week (but wife reports this is more frequent as often as every other night) when patient was taking insulin.  He reports sweating/hunger with treatment of cake or candies. Reports nocturia once per night Reports neuropathy in legs/feet.   Diet: Has started drinking diet drinks Exercise: has started walking more around his new neighborhood and walking his grandkids to school.    Not currently checking CBGs or blood pressure  Denies chest pain, shortness of breath. Denies signs/symptoms of orthostasis or falls. Reports warfarin on hold since eye surgery and his next opthalmology appointment is on 03/22/17  Objective:   Encounter Medications: Outpatient Encounter Prescriptions as of 03/16/2017  Medication Sig Note  . amiodarone (PACERONE) 200 MG tablet Take 1 tablet (200 mg total) by mouth daily. (Patient taking differently: Take 200 mg by mouth 2 (two) times daily. )   . aspirin EC 81 MG tablet Take 81 mg by mouth daily.   Marland Kitchen atorvastatin (LIPITOR) 20 MG tablet Take 1 tablet (20 mg total) by mouth daily at 6 PM.   . carvedilol (COREG) 25 MG tablet Take 1 tablet (25 mg total) by  mouth 2 (two) times daily with a meal.   . DULoxetine (CYMBALTA) 20 MG capsule Take 20 mg by mouth daily.   . insulin aspart protamine- aspart (NOVOLOG MIX 70/30) (70-30) 100 UNIT/ML injection Inject 35 Units into the skin 2 (two) times daily with a meal.   . nitroGLYCERIN (NITROSTAT) 0.4 MG SL tablet Place 1 tablet (0.4 mg total) under the tongue every 5 (five) minutes x 3 doses as needed for chest pain.   Marland Kitchen omeprazole (PRILOSEC) 40 MG capsule Take 40 mg by mouth daily.   . tamsulosin (FLOMAX) 0.4 MG CAPS capsule Take 1 capsule (0.4 mg total) by mouth daily.   . traZODone (DESYREL) 100 MG tablet Take 100 mg by mouth at bedtime as needed for sleep.   . valsartan (DIOVAN) 160 MG tablet Take 1 tablet (160 mg total) by mouth daily.   . sitaGLIPtin (JANUVIA) 100 MG tablet Take 100 mg by mouth daily.   Marland Kitchen warfarin (COUMADIN) 1 MG tablet Take 1 tablet (1 mg total) by mouth one time only at 6 PM. (Patient not taking: Reported on 03/16/2017) 02/01/2017: 1.5 tablets on Monday, Wednesday, Friday and 1 tablet all other days   . [DISCONTINUED] FLUoxetine (PROZAC) 40 MG capsule Take 40 mg by mouth daily.    No facility-administered encounter medications on file as of 03/16/2017.     Functional Status: In your present state of health, do you have any difficulty performing the following activities: 01/10/2017 11/29/2016  Hearing? N N  Vision? - N  Difficulty concentrating or  making decisions? - N  Walking or climbing stairs? - N  Dressing or bathing? - N  Doing errands, shopping? - N  Conservation officer, nature and eating ? N -  Using the Toilet? N -  In the past six months, have you accidently leaked urine? N -  Do you have problems with loss of bowel control? Y -  Managing your Medications? Y -  Managing your Finances? Y -  Housekeeping or managing your Housekeeping? Y -  Some recent data might be hidden    Fall/Depression Screening: PHQ 2/9 Scores 02/24/2017 01/10/2017 11/29/2016 10/07/2016  PHQ - 2 Score _0 0   PHQ- 9 Score - 11 17 -    Assessment: Drugs sorted by system: Neurologic/Psychologic: cymbalta, trazodone as needed Cardiovascular: Amiodarone 200 mg daily (patient taking 200 mg twice daily), aspirin, atorvastatin, carvedilol, nitroglycerin, valsartan 160 mg daily (prescription still says 80 mg daily), warfarin (on hold following eye surgery) Gastrointestinal: omeprazole Endocrine: novolog 70/30 (has been out for 10 days), Januvia (out of medication, cannot afford copay)  Renal: tamsulosin,   Assessment:  Medication Assistance: [facility] Medicare patient that cannot afford the copay for his insulin or his Januvia. Patient is currently in the Medicare deductible phase.Patient has been denied low income subsidy/extra help. Patient eligible for Tier 1/2 90 day $0 copay (exceptions include 70/30 insulin and Januvia which are $47/month copay) for his other medications.  Patient currently out of insulin for ~10 days due to lost medication from recent move. Patient unable to afford insulin due to recent medical/housing expenses.    Plan: Diabetes:  -[place] has provided patient with 1 month supply of insulin Novolin 70/30 from [facility] over the counter.  Discussed with patient that we cannot pay for his insulin long term and he believes he can pay for his insulin next month. -Will contact patients primary care provider to consider providing samples of Januvia or consider discontinuing as patient cannot afford copay.  Atrial Fibrillation:  -Instructed patient to take amiodarone 200 mg daily as prescribed.  -Will contact cardiologist to notify of patient taking wrong dose of amiodarone and obtain new prescription for valsartan 160 mg daily as patient currently only has valsartan 80 mg daily prescription following dose increase.   -Will followup in 1 week to assess compliance with CBG testing, blood pressure, and to assess restart of warfarin following opthalmology visit.   Bennye Alm,  PharmD, BCPS [facility] PGY2 Pharmacy Resident [phone]  [facility] CM Care Plan Problem One     Most Recent Value  Care Plan Problem One  Medication adherence related to chronic disease state managment  Role Documenting the Problem One  Clinical Pharmacist  Care Plan for Problem One  Active  THN CM Short Term Goal #1 (0-30 days)  Patient will have adherence to his medications >80% of the time per patient report  [facility] CM Short Term Goal #1 Start Date  12/28/16  [facility] CM Short Term Goal #1 Met Date  02/01/17  Interventions for Short Term Goal #1  Counseled on medication adherence and discussed obtaining medications from [facility] mail order  [facility] CM Short Term Goal #2 (0-30 days)  Patient will check blood pressure and blood glucose as directed over the next 30 days  THN CM Short Term Goal #2 Start Date  03/16/17  Interventions for Short Term Goal #2  discussed importance of checking blood pressue and blood glucose in maintaining control of diabetes and hypertension    THN CM Care Plan Problem  Three     Most Recent Value  Care Plan Problem Three  Noncompliance with self monitoring of blood glucose and blood pressure  Role Documenting the Problem Three  Clinical Pharmacist  Care Plan for Problem Three  Active  THN CM Short Term Goal #1 (0-30 days)  Patient will check blood pressure at least 5 times per week over the next 14 days as evidenced by BP readings at next visit  [facility] CM Short Term Goal #1 Start Date  02/09/17  Interventions for Short Term Goal #1  Counseled on importance of checking blood pressure at home to better manage hypertension  THN CM Short Term Goal #2 (0-30 days)  Patient will check CBGs at least twice daily over the next 14 days and write values down as measured by CBG readings at next visit  [facility] CM Short Term Goal #2 Start Date  02/09/17  Interventions for Short Term Goal #2  Discussed importance of checking CBGs. Counseled on hypoglycemia.

## 2017-03-23 ENCOUNTER — Other Ambulatory Visit: Payer: Self-pay | Admitting: Pharmacist

## 2017-03-23 NOTE — Patient Outreach (Addendum)
[place] [facility]) Care Management  [place]   03/23/2017  ERVEN RAMSON [birth year] [id number]  Subjective: 66 year old male referred to [place] for medication adherence/medication assistance. Patient reports adherence to his medications except for his Januvia which he is out of and cannot afford the copay.  Patient reports he is currently taking the Novolin 70/30 in which [place] purchased as part of the [facility] emergency fund last week  Today he also reports he went to opthalmology yesterday and he is to restart Warfarin. He plans to followup for INR check next week.  Has followup with opthamology on 04/12/17.    Has followup with [name] in a few months.    Denies signs/symptoms of Hypoglycemia.    Reported 2 hour post lunch CBGs: 358, 189, 141, 178, 240, 398 mg/dL  Reported home blood pressure readings: 157/86, 155/98, 167/111, 167/82 mm Hg.    Objective:   Encounter Medications: Outpatient Encounter Prescriptions as of 03/23/2017  Medication Sig Note  . amiodarone (PACERONE) 200 MG tablet Take 1 tablet (200 mg total) by mouth daily. (Patient taking differently: Take 200 mg by mouth 2 (two) times daily. )   . aspirin EC 81 MG tablet Take 81 mg by mouth daily.   Marland Kitchen atorvastatin (LIPITOR) 20 MG tablet Take 1 tablet (20 mg total) by mouth daily at 6 PM.   . carvedilol (COREG) 25 MG tablet Take 1 tablet (25 mg total) by mouth 2 (two) times daily with a meal.   . DULoxetine (CYMBALTA) 20 MG capsule Take 20 mg by mouth daily.   . insulin aspart protamine- aspart (NOVOLOG MIX 70/30) (70-30) 100 UNIT/ML injection Inject 35 Units into the skin 2 (two) times daily with a meal.   . nitroGLYCERIN (NITROSTAT) 0.4 MG SL tablet Place 1 tablet (0.4 mg total) under the tongue every 5 (five) minutes x 3 doses as needed for chest pain.   Marland Kitchen omeprazole (PRILOSEC) 40 MG capsule Take 40 mg by mouth daily.   . sitaGLIPtin (JANUVIA) 100 MG tablet Take 100 mg by mouth  daily.   . tamsulosin (FLOMAX) 0.4 MG CAPS capsule Take 1 capsule (0.4 mg total) by mouth daily.   . traZODone (DESYREL) 100 MG tablet Take 100 mg by mouth at bedtime as needed for sleep.   . valsartan (DIOVAN) 160 MG tablet Take 1 tablet (160 mg total) by mouth daily.   Marland Kitchen warfarin (COUMADIN) 1 MG tablet Take 1 tablet (1 mg total) by mouth one time only at 6 PM. (Patient not taking: Reported on 03/16/2017) 02/01/2017: 1.5 tablets on Monday, Wednesday, Friday and 1 tablet all other days    No facility-administered encounter medications on file as of 03/23/2017.     Functional Status: In your present state of health, do you have any difficulty performing the following activities: 01/10/2017 11/29/2016  Hearing? N N  Vision? - N  Difficulty concentrating or making decisions? - N  Walking or climbing stairs? - N  Dressing or bathing? - N  Doing errands, shopping? - N  Conservation officer, nature and eating ? N -  Using the Toilet? N -  In the past six months, have you accidently leaked urine? N -  Do you have problems with loss of bowel control? Y -  Managing your Medications? Y -  Managing your Finances? Y -  Housekeeping or managing your Housekeeping? Y -  Some recent data might be hidden    Fall/Depression Screening: PHQ 2/9 Scores 02/24/2017  01/10/2017 11/29/2016 10/07/2016  PHQ - 2 Score 2 2 5  0  PHQ- 9 Score - 11 17 -    Assessment: Diabetes: A1C currently above goal with elevated CBGs.  Patient was out of insulin for 10 days and [facility] pharmacy purchased insulin with [facility] emergency fund until patient can improve his financial situation.   Medication Assistance: [facility] Medicare patient that cannot afford the copay for his insulin or his Januvia. Patient is currently in the Medicare deductible phase.Patient has been denied low income subsidy/extra help. Patient eligible for Tier 1/2 90 day $0 copay (exceptions include 70/30 insulin and Januvia which are $47/month copay) for his other medications. [facility]  pharmacy purchased insulin with [facility] emergency fund until patient can improve his financial situation.    Plan: -Instructed patient to check blood glucose fasting and more consistently -Instructed patient to check blood pressure AND heart rates and write down values.  -Mailed receipt for insulin as the housing authority may lower his rent if he provides receipts for medications -Will contact [name], [place] to consider stopping Januvia as patient cannot afford copays for both insulin and Januvia in the future unless she can provide samples and will also notify of current blood pressure and blood glucose values.  Telephone followup in 3-4 weeks.   Bennye Alm, PharmD, BCPS [facility] PGY2 Pharmacy Resident [phone]  [facility] CM Care Plan Problem One     Most Recent Value  Care Plan Problem One  Medication adherence related to chronic disease state managment  Role Documenting the Problem One  Clinical Pharmacist  Care Plan for Problem One  Active  THN CM Short Term Goal #1 (0-30 days)  Patient will have adherence to his medications >80% of the time per patient report  THN CM Short Term Goal #1 Start Date  03/23/17  Interventions for Short Term Goal #1  Counseled on medication adherence  THN CM Short Term Goal #2 (0-30 days)  Patient will check blood pressure and blood glucose as directed over the next 30 days  THN CM Short Term Goal #2 Start Date  03/23/17  Interventions for Short Term Goal #2  discussed importance of checking blood pressue and blood glucose in maintaining control of diabetes and hypertension    THN CM Care Plan Problem Three     Most Recent Value  Care Plan Problem Three  Noncompliance with self monitoring of blood glucose and blood pressure  Role Documenting the Problem Three  Clinical Pharmacist  Care Plan for Problem Three  Active  THN CM Short Term Goal #1 (0-30 days)  Patient will check blood pressure at least 5 times per week over the next 14 days as evidenced by BP  readings at next visit  [facility] CM Short Term Goal #1 Start Date  02/09/17  Interventions for Short Term Goal #1  Counseled on importance of checking blood pressure at home to better manage hypertension  THN CM Short Term Goal #2 (0-30 days)  Patient will check CBGs at least twice daily over the next 14 days and write values down as measured by CBG readings at next visit  [facility] CM Short Term Goal #2 Start Date  02/09/17  Interventions for Short Term Goal #2  Discussed importance of checking CBGs. Counseled on hypoglycemia.

## 2017-03-28 ENCOUNTER — Other Ambulatory Visit: Payer: Self-pay | Admitting: Cardiology

## 2017-03-28 MED ORDER — VALSARTAN 160 MG PO TABS: 160.0000 mg | ORAL_TABLET | Freq: Every day | ORAL | 6 refills | Status: DC

## 2017-04-19 ENCOUNTER — Other Ambulatory Visit: Payer: Self-pay | Admitting: Pharmacist

## 2017-04-20 ENCOUNTER — Ambulatory Visit (INDEPENDENT_AMBULATORY_CARE_PROVIDER_SITE_OTHER): Payer: Medicare HMO | Admitting: *Deleted

## 2017-04-20 DIAGNOSIS — I255 Ischemic cardiomyopathy: Secondary | ICD-10-CM

## 2017-04-20 NOTE — Progress Notes (Signed)
Remote ICD transmission.   

## 2017-04-22 ENCOUNTER — Encounter: Payer: Self-pay | Admitting: Cardiology

## 2017-04-22 LAB — CUP PACEART REMOTE DEVICE CHECK
Battery Remaining Longevity: 60 mo
Battery Voltage: 2.95 V
Brady Statistic AP VP Percent: 1 %
Brady Statistic AP VS Percent: 1 %
Brady Statistic AS VP Percent: 1 %
Brady Statistic RA Percent Paced: 1 %
Brady Statistic RV Percent Paced: 1 %
HighPow Impedance: 49 Ohm
Implantable Lead Implant Date: 20070516
Implantable Lead Location: 753859
Implantable Lead Model: 7001
Lead Channel Impedance Value: 350 Ohm
Lead Channel Pacing Threshold Amplitude: 0.5 V
Lead Channel Pacing Threshold Amplitude: 1.25 V
Lead Channel Pacing Threshold Pulse Width: 0.5 ms
Lead Channel Setting Pacing Amplitude: 2 V
Lead Channel Setting Pacing Amplitude: 2.5 V
Lead Channel Setting Pacing Pulse Width: 0.5 ms
Lead Channel Setting Sensing Sensitivity: 0.5 mV
MDC IDC LEAD IMPLANT DT: 20070516
MDC IDC LEAD LOCATION: 753860
MDC IDC MSMT BATTERY REMAINING PERCENTAGE: 54 %
MDC IDC MSMT LEADCHNL RA IMPEDANCE VALUE: 440 Ohm
MDC IDC MSMT LEADCHNL RA PACING THRESHOLD PULSEWIDTH: 0.5 ms
MDC IDC MSMT LEADCHNL RA SENSING INTR AMPL: 3 mV
MDC IDC MSMT LEADCHNL RV SENSING INTR AMPL: 11.9 mV
MDC IDC PG IMPLANT DT: 20140407
MDC IDC PG SERIAL: 1056971
MDC IDC SESS DTM: 20180509070243
MDC IDC STAT BRADY AS VS PERCENT: 99 %

## 2017-04-26 ENCOUNTER — Other Ambulatory Visit: Payer: Self-pay | Admitting: Pharmacist

## 2017-04-26 NOTE — Patient Outreach (Signed)
[facility] [facility]) Care Management  04/26/2017  JABEZ MOLNER 06-Jan-1951 [id number]   66 y.o. year old male referred to [place] for Medication Assistance (Pharmacy Telephone Outreach)   Was unable to reach patient via telephone today and have left HIPAA compliant voicemail asking him to return my call (unsuccessful outreach #2).  Plan: Followup in 1-2 days via telephone  Bennye Alm, PharmD, [place] PGY2 Pharmacy Resident [phone]

## 2017-04-26 NOTE — Patient Outreach (Signed)
[place] [facility]) Care Management  04/19/2017  AEON KESSNER 04/14/1951 [id number]   66 y.o. year old male referred to [place] for Medication Management (Pharmacy Telephone Followup) and Medication Assistance   Was unable to reach patient via telephone today and have left HIPAA compliant voicemail asking him to return my call (unsuccessful outreach #1).  Plan: Followup in 1 week via telephone  Bennye Alm, PharmD, [place] PGY2 Pharmacy Resident [phone]

## 2017-04-27 ENCOUNTER — Other Ambulatory Visit: Payer: Self-pay | Admitting: Pharmacist

## 2017-04-27 ENCOUNTER — Ambulatory Visit: Payer: Self-pay | Admitting: Pharmacist

## 2017-04-27 NOTE — Patient Outreach (Addendum)
[place] [facility]) Care Management  04/27/2017  [name] [name] [birth year] [id number]   65 y.o. year old male referred to [place] for Medication Management (Pharmacy Telephone Outreach) and Medication Assistance   Was unable to reach patient via telephone today and have left HIPAA compliant voicemail asking patient to return my call (unsuccessful outreach #3).  Plan: Will send unsuccessful outreach letter and plan to close patient out if no contact received within 10 business days Followup in 10 business days via telephone  Bennye Alm, PharmD, [place] PGY2 Pharmacy Resident [phone]

## 2017-04-29 ENCOUNTER — Other Ambulatory Visit: Payer: Self-pay | Admitting: Pharmacist

## 2017-04-29 NOTE — Patient Outreach (Signed)
[place] [facility]) Care Management  04/29/2017  [name] [name] 06/13/1951 [id number]   66 y.o. year old male referred to [place] for Medication Management (Pharmacy Telephone Followup) and Medication Assistance  Received voicemail from patient yesterday asking me to return his call.    Was unable to reach patient via telephone today and have left HIPAA compliant voicemail asking patient to return my call (unsuccessful outreach #1).  Plan: Followup next week via telephone  Bennye Alm, PharmD, [place] PGY2 Pharmacy Resident [phone]

## 2017-05-03 ENCOUNTER — Ambulatory Visit: Payer: Self-pay | Admitting: Pharmacist

## 2017-05-03 ENCOUNTER — Other Ambulatory Visit: Payer: Self-pay | Admitting: Pharmacist

## 2017-05-03 NOTE — Patient Outreach (Signed)
[facility]) Care Management  05/03/2017  [name] [name] 20-Jun-1951 [id number]   66 y.o. year old male referred to [place] for Medication Management (Pharmacy Telephone Outreach) and Medication Assistance   Was unable to reach patient via telephone today and have left HIPAA compliant voicemail asking patient to return my call (unsuccessful outreach #2).  Plan: Followup in 2-3 days via telephone  Bennye Alm, PharmD, [place] PGY2 Pharmacy Resident [phone]

## 2017-05-06 ENCOUNTER — Telehealth: Payer: Self-pay | Admitting: Cardiology

## 2017-05-06 ENCOUNTER — Other Ambulatory Visit: Payer: Self-pay | Admitting: Pharmacist

## 2017-05-06 ENCOUNTER — Encounter: Payer: Self-pay | Admitting: Cardiology

## 2017-05-06 MED ORDER — AMIODARONE HCL 200 MG PO TABS: 200.0000 mg | ORAL_TABLET | Freq: Every day | ORAL | 0 refills | Status: DC

## 2017-05-06 NOTE — Telephone Encounter (Signed)
Spoke with Jens Som PA to see if patient can be seen today - EP patient, she last saw him 11/2016. She would like this discussed with DOD - OK to add on her schedule if DOD thinks is OK to be seen in office.

## 2017-05-06 NOTE — Telephone Encounter (Signed)
Spoke with Lowella Grip, pharmacist with [facility] He spoke with patient who complained of chest tightness and facial numbness and being numb all over (his note is in EPIC). Kelsy expressed concern that patient may be described stroke-like symptoms and advised ED eval (patient refused), advised urgent care eval (patient refused), called PCP office who advised hospital eval.  Lowella Grip is calling to see if patient can be seen today for work in. He states [facility] will pay for patient's transportation & appointment.   Danella Sensing I will contact patient, but it sounds like ED eval is warranted if the noted symptoms are occurring at present. Per Lowella Grip [facility] will pay for this appointment.  Amiodarone refilled per [facility] request Call back for [facility] - [phone]    Called patient Patient states he is having a little chest tightness - occurs off and on He is most concerned about numbness in his whole face that is going on for 2-3 days Feels like someone shot his face w/novacaine He states his legs feel like "dead meat", balance is gone He describes pain in testicles as well - this pain is shooting up in groin area Patient states he does not want to go to ED - does not want to sit there No issues w/swallowing, unilateral symptoms He has no energy

## 2017-05-06 NOTE — Patient Outreach (Signed)
[facility]) Care Management  05/06/2017  [name] [name] 1951-11-01 [id number]  66 year old male referred to [place] for medication management and medication assistance.  Today he reports Testicle pain and that he has been "numb" from head to feet.    He has not had INR since eye surgery over 1 month ago and tooth pulled 2 weeks ago.    He reports has 1/3 bottle of insulin remaining.  He purchased a vial of OTC insulin a week ago.  He believes he can continue to buy some insulin.   He has been out of his other medications for 3-4 days.    Reports CBGs ranging from 100-400s.  Fasting CBG today 155 mg/dL. Denies low blood glucose.    Reports his blood pressure has been running high.  170/110-115s.   Denies chest pain but has had some tightness which he has had in the past.     Denies chest palpitations.   Denies fever Reports nausea  His biggest concern is numbness.  Feels like someone has shot his whole body with Novocaine for 2-3 days which has been getting worse.  He has not had any energy to do anything.   Denies facial drooping, trouble speaking.  Denies signs/symptoms of bleeding on warfarin.   He reports [facility] pharmacy had refill of atorvastatin, carvedilol, and is sending it but he needs new prescription for Amiodarone and Tamsulosin.   Plan: Called patients primary care [name], [place] to discuss need for INR monitoring and potential same day visit for testicle pain, numbness and chest tightness.  They reported no provider is in the office today and recommended emergency visit but patient refused due to cost. Called patients cardiologist and they have recommended emergency department visit and/or same day appointment with cardiology provider but patient has refused.   Instructed patient to seek urgent care or emergency department visit if symptoms worsen Will notify [name] that patient needs INR followup and will plan [facility] home visit for  next week to further assess medication management and medication assistance.  Bennye Alm, PharmD, [place] PGY2 Pharmacy Resident [phone]

## 2017-05-06 NOTE — Telephone Encounter (Signed)
Spoke with DOD Dr. Oval Linsey. It is preferable that patient seek ED eval for symptoms, but not unreasonable for him to be seen in the office. Per [name] [name], [place] it is OK to schedule him this afternoon if DOD OK'ed this - latest appointment for the day.

## 2017-05-06 NOTE — Telephone Encounter (Signed)
New Message     Pt is having numbness and chest tightness , refusing to go Urgent care or ER

## 2017-05-06 NOTE — Telephone Encounter (Signed)
Returned call to patient. Explained that ED eval is most appropriate but offered appointment appt with Joseph Art, [place] today @ 2:30pm. Patient refused this appointment. He states he will monitor his symptoms and go to hospital if he feels the need to.   Followed up with Lowella Grip, pharmacist and notified him of discussion w/patient.

## 2017-05-11 ENCOUNTER — Ambulatory Visit: Payer: Self-pay | Admitting: Pharmacist

## 2017-05-11 ENCOUNTER — Other Ambulatory Visit: Payer: Self-pay | Admitting: Pharmacist

## 2017-05-11 VITALS — BP 192/126 | HR 67

## 2017-05-11 DIAGNOSIS — E1165 Type 2 diabetes mellitus with hyperglycemia: Secondary | ICD-10-CM

## 2017-05-11 DIAGNOSIS — Z794 Long term (current) use of insulin: Principal | ICD-10-CM

## 2017-05-11 NOTE — Patient Outreach (Signed)
[name] [name]) Care Management  [place]   05/11/2017  [name] [name] Jul 23, 1951 [id number]  Subjective: 66 year old male referred to [place] for medication management and medication assistance.  Today patient states he is feeling better and his "numbness" has decreased.    Patient denies chest pain but does have chest tightness.  Patient denies signs/symptoms of bleeding on warfarin but has not had his INR checked in several months.   Patient denies palpitations or shortness of breath  Patient has 1-2 days of insulin remaining.  He believes he can afford another bottle of insulin this Friday but is not completely sure.  Patient has also been out of several medications for several weeks.   CBG currently 390 (fasting).  Reports most readings 144-300s.  Denies hypoglycemic events.    Patient has not taken his blood pressure medications or any medications prior to visit today   Home BP readings 5/20 165/103 HR 66 5/17 166/107 HR 77 4/27 169/107 HR 64 4/16 158/97   HR 70 4/15 159/101 HR 63  Objective:  Vitals:   05/11/17 1156  BP: (!) 192/126  Pulse: 67    Encounter Medications: Outpatient Encounter Prescriptions as of 05/11/2017  Medication Sig Note  . aspirin EC 81 MG tablet Take 81 mg by mouth daily.   . carvedilol (COREG) 25 MG tablet Take 1 tablet (25 mg total) by mouth 2 (two) times daily with a meal.   . insulin aspart protamine- aspart (NOVOLOG MIX 70/30) (70-30) 100 UNIT/ML injection Inject 35 Units into the skin 2 (two) times daily with a meal.   . nitroGLYCERIN (NITROSTAT) 0.4 MG SL tablet Place 1 tablet (0.4 mg total) under the tongue every 5 (five) minutes x 3 doses as needed for chest pain.   Marland Kitchen omeprazole (PRILOSEC) 40 MG capsule Take 40 mg by mouth daily.   . traZODone (DESYREL) 100 MG tablet Take 100 mg by mouth at bedtime as needed for sleep.   . valsartan (DIOVAN) 160 MG tablet Take 1 tablet (160 mg total) by mouth daily.   Marland Kitchen  warfarin (COUMADIN) 1 MG tablet Take 1 tablet (1 mg total) by mouth one time only at 6 PM. 02/01/2017: 1.5 tablets on Monday, Wednesday, Friday and 1 tablet all other days   . amiodarone (PACERONE) 200 MG tablet Take 1 tablet (200 mg total) by mouth daily. <PLEASE MAKE APPOINTMENT FOR REFILLS> (Patient not taking: Reported on 05/11/2017)   . atorvastatin (LIPITOR) 20 MG tablet Take 1 tablet (20 mg total) by mouth daily at 6 PM. (Patient not taking: Reported on 05/11/2017)   . DULoxetine (CYMBALTA) 20 MG capsule Take 20 mg by mouth daily.   . sitaGLIPtin (JANUVIA) 100 MG tablet Take 100 mg by mouth daily.   . tamsulosin (FLOMAX) 0.4 MG CAPS capsule Take 1 capsule (0.4 mg total) by mouth daily. (Patient not taking: Reported on 05/06/2017)    No facility-administered encounter medications on file as of 05/11/2017.     Functional Status: In your present state of health, do you have any difficulty performing the following activities: 01/10/2017 11/29/2016  Hearing? N N  Vision? - N  Difficulty concentrating or making decisions? - N  Walking or climbing stairs? - N  Dressing or bathing? - N  Doing errands, shopping? - N  Conservation officer, nature and eating ? N -  Using the Toilet? N -  In the past six months, have you accidently leaked urine? N -  Do you have  problems with loss of bowel control? Y -  Managing your Medications? Y -  Managing your Finances? Y -  Housekeeping or managing your Housekeeping? Y -  Some recent data might be hidden    Fall/Depression Screening: Fall Risk  02/24/2017 01/10/2017 12/28/2016  Falls in the past year? No - Yes  Number falls in past yr: - - 2 or more  Injury with Fall? - - Yes  Risk Factor Category  - - High Fall Risk  Risk for fall due to : History of fall(s) History of fall(s) -  Follow up - - Falls prevention discussed   PHQ 2/9 Scores 02/24/2017 01/10/2017 11/29/2016 10/07/2016  PHQ - 2 Score 2 2 5  0  PHQ- 9 Score - 11 17 -   Assessment: Drugs sorted by  system: Neurologic/Psychologic: trazodone, duloxetine (not taking- out of medication)  Cardiovascular: amiodarone (not taking- out of medication), aspirin, atorvastatin (not taking- out of medication), carvedilol, nitroglycerin, valsartan, warfarin  Gastrointestinal: omeprazole  Endocrine: Novolin 70/30 ([employer] Over the counter), sitagliptin (not taking- out of medication and cannot afford copay)  Renal: tamsulosin (not taking- out of medication)  Duplications in therapy:  Gaps in therapy:  Needs addition blood pressure therapy Other issues noted: Patient needs primary care visit and INR followup.  Patient is not motivated to check his own blood pressure/CBGs   Medication Assistance: [facility] Medicare patient that cannot afford the copay for his insulin or his Januvia. Patient is currently in the Medicare deductible phase.Patient has been denied low income subsidy/extra help. Patient eligible for Tier 1/2 90 day $0 copay (exceptions include 70/30 insulin and Januvia which are $47/month copay) for his other medications. [name] pharmacy previously purchased insulin with [name] emergency fund until patient can improve his financial situation.   Plan: -Patient plans to pick up his insulin this Friday once he receives his check -[place] mail order pharmacy and helped patient order amiodarone, atorvastatin, and carvedilol.  [facility] faxed [name], [place] for omeprazole, tamsulosin, and warfarin.   -Called [name], [place] to notify of high blood pressure readings and CBG readings.  Also notified patient would need refill of omeprazole, tamsulosin, warfarin, and duloxetine sent to mail order. Have scheduled appointment with her on 6/6 for visit and INR check.   -Will notify cardiologist of high blood pressure readings and suggest followup appointment and further pharmacotherapy intervention -Will place consult for [facility] RN and social worker who have previously seen patient in the  past -Will followup on Friday via telephone  Bennye Alm, PharmD, [place] PGY2 Pharmacy Resident [phone]  [facility] CM Care Plan Problem One     Most Recent Value  Care Plan Problem One  Medication adherence related to chronic disease state managment  Role Documenting the Problem One  Clinical Pharmacist  Care Plan for Problem One  Active  [facility] CM Short Term Goal #1   Patient will have adherence to his medications >80% of the time per patient report  [facility] CM Short Term Goal #1 Start Date  05/11/17  Interventions for Short Term Goal #1  Counseled on medication adherence  [facility] CM Short Term Goal #2   Patient will check blood pressure and blood glucose as directed over the next 30 days  [facility] CM Short Term Goal #2 Start Date  05/11/17  Interventions for Short Term Goal #2  discussed importance of checking blood pressue and blood glucose in maintaining control of diabetes and hypertension    [facility] CM Care Plan Problem  Three     Most Recent Value  Care Plan Problem Three  Noncompliance with self monitoring of blood glucose and blood pressure  Role Documenting the Problem Three  Clinical Pharmacist  Care Plan for Problem Three  Active  [facility] CM Short Term Goal #1   Patient will check blood pressure at least 5 times per week over the next 14 days as evidenced by BP readings at next visit  [facility] CM Short Term Goal #1 Start Date  02/09/17  Interventions for Short Term Goal #1  Counseled on importance of checking blood pressure at home to better manage hypertension  [facility] CM Short Term Goal #2   Patient will check CBGs at least twice daily over the next 14 days and write values down as measured by CBG readings at next visit  [facility] CM Short Term Goal #2 Start Date  02/09/17  Interventions for Short Term Goal #2  Discussed importance of checking CBGs. Counseled on hypoglycemia.

## 2017-05-13 ENCOUNTER — Other Ambulatory Visit: Payer: Self-pay | Admitting: Pharmacist

## 2017-05-13 NOTE — Patient Outreach (Addendum)
[facility] [facility]) Care Management  05/13/2017  [name] [name] [birth year] [id number]  66 year old male referred to [place] for medication management and medication assistance.  Called patient today to followup if he could pick up his insulin today.  Spoke with him briefly as he was driving to pick up his grandkids.  Patient states he plans to pick up his insulin later today.    Denies chest pain or palpitations  Reprots BP 175/110 today. Reports HR in 60s.    He did receive carvedilol, atorvastatin, and amiodarone today from [facility] mail order Reports his CBG last night was ~350.     Plan: Have sent previous note to cardiology and [name], [place]. Patient has primary care visit next week.  Patient stated he would attend this visit. Will followup in 2-3 weeks via telephone  Bennye Alm, PharmD, [place] PGY2 Pharmacy Resident [phone]

## 2017-05-16 ENCOUNTER — Encounter: Payer: Self-pay | Admitting: Pharmacist

## 2017-05-18 DIAGNOSIS — Z7689 Persons encountering health services in other specified circumstances: Secondary | ICD-10-CM | POA: Diagnosis not present

## 2017-05-18 DIAGNOSIS — I4891 Unspecified atrial fibrillation: Secondary | ICD-10-CM | POA: Diagnosis not present

## 2017-05-18 DIAGNOSIS — Z5181 Encounter for therapeutic drug level monitoring: Secondary | ICD-10-CM | POA: Diagnosis not present

## 2017-05-18 DIAGNOSIS — I10 Essential (primary) hypertension: Secondary | ICD-10-CM | POA: Diagnosis not present

## 2017-05-18 DIAGNOSIS — Z6835 Body mass index (BMI) 35.0-35.9, adult: Secondary | ICD-10-CM | POA: Diagnosis not present

## 2017-05-18 DIAGNOSIS — F329 Major depressive disorder, single episode, unspecified: Secondary | ICD-10-CM | POA: Diagnosis not present

## 2017-05-18 DIAGNOSIS — E669 Obesity, unspecified: Secondary | ICD-10-CM | POA: Diagnosis not present

## 2017-05-19 ENCOUNTER — Other Ambulatory Visit: Payer: Self-pay

## 2017-05-19 NOTE — Patient Outreach (Signed)
New referral: This patient is well known to me. New referral from [place] call to patient. No answer. Left a message requesting a call.  Patient called back within 10 minutes.   States that he has all his medications right now. Reports that he understands how to manage his DM.  Reports that he has been eating some potatoes and bread.  Reports CBG levels up and down.   States his biggest concern right now is. financial issues and his nerves.  Reviewed DM management and patient feels like he knows what to do. Reviewed with patient that according to EMR he was directed to go to the ED. He states that he knew he was not having a heart attack and did not need to go to the ED. States that he saw his primary MD yesterday.   PLAN: declines nursing needs at this time.  Reviewed with patient that [facility] social worker could assist with depression and financial concerns. Will send in basket message to [place] and [facility] social worker. Will not open to nursing at this time.   Tomasa Rand, RN, BSN, CEN [facility] [employer] [phone]

## 2017-05-20 ENCOUNTER — Other Ambulatory Visit: Payer: Self-pay | Admitting: Licensed Clinical Social Worker

## 2017-05-20 NOTE — Patient Outreach (Signed)
[facility] [facility]) Care Management  05/20/2017  [name] [name] December 10, 1951 [id number]  Assessment-CSW is currently covering from [place]. CSW completed initial outreach attempt today after CSW received referral on 05/16/17 for financial and mental health assistance. CSW unable to reach patient successfully. CSW left a HIPPA compliant voice message encouraging patient to return call once available.  Plan-CSW will await return call and will update [facility] CSW Eduard Clos of unsuccessful outreach attempt.   Eula Fried, BSW, MSW, [email or web address] Phone: [phone] Fax: [phone]

## 2017-05-24 ENCOUNTER — Other Ambulatory Visit: Payer: Self-pay | Admitting: *Deleted

## 2017-05-24 NOTE — Patient Outreach (Signed)
[facility]) Care Management  05/24/2017  [place] Nov 05, 1951 [id number]   CSW made 2nd phone attempt to try and contact patient regarding CSW referral received, without success.  No voicemail set up. Will try again in 2-3 days if no return call is received.   Eduard Clos, MSW, [place] Worker  [place] [phone]

## 2017-05-27 ENCOUNTER — Other Ambulatory Visit: Payer: Self-pay | Admitting: *Deleted

## 2017-05-27 NOTE — Patient Outreach (Signed)
[place]) Care Management  [facility] Social Work  05/27/2017  [name] [name] [birth year] [id number]  Subjective:  "we are doing fairly well- bills are paid, if only I felt better"  Objective: CSW to assist patient with commuity based resources to aide in his well-being, quality of life and overall safety/needs.    Encounter Medications:  Outpatient Encounter Prescriptions as of 05/27/2017  Medication Sig Note  . DULoxetine (CYMBALTA) 30 MG capsule Take 30 mg by mouth daily.   Marland Kitchen amiodarone (PACERONE) 200 MG tablet Take 1 tablet (200 mg total) by mouth daily. <PLEASE MAKE APPOINTMENT FOR REFILLS> (Patient not taking: Reported on 05/11/2017)   . aspirin EC 81 MG tablet Take 81 mg by mouth daily.   Marland Kitchen atorvastatin (LIPITOR) 20 MG tablet Take 1 tablet (20 mg total) by mouth daily at 6 PM. (Patient not taking: Reported on 05/11/2017)   . carvedilol (COREG) 25 MG tablet Take 1 tablet (25 mg total) by mouth 2 (two) times daily with a meal.   . DULoxetine (CYMBALTA) 20 MG capsule Take 20 mg by mouth daily.   . insulin aspart protamine- aspart (NOVOLOG MIX 70/30) (70-30) 100 UNIT/ML injection Inject 35 Units into the skin 2 (two) times daily with a meal.   . nitroGLYCERIN (NITROSTAT) 0.4 MG SL tablet Place 1 tablet (0.4 mg total) under the tongue every 5 (five) minutes x 3 doses as needed for chest pain.   Marland Kitchen omeprazole (PRILOSEC) 40 MG capsule Take 40 mg by mouth daily.   . sitaGLIPtin (JANUVIA) 100 MG tablet Take 100 mg by mouth daily.   . tamsulosin (FLOMAX) 0.4 MG CAPS capsule Take 1 capsule (0.4 mg total) by mouth daily. (Patient not taking: Reported on 05/06/2017)   . traZODone (DESYREL) 100 MG tablet Take 100 mg by mouth at bedtime as needed for sleep.   . valsartan (DIOVAN) 160 MG tablet Take 1 tablet (160 mg total) by mouth daily.   Marland Kitchen warfarin (COUMADIN) 1 MG tablet Take 1 tablet (1 mg total) by mouth one time only at 6 PM. 02/01/2017: 1.5 tablets on Monday, Wednesday, Friday  and 1 tablet all other days    No facility-administered encounter medications on file as of 05/27/2017.     Functional Status:  In your present state of health, do you have any difficulty performing the following activities: 01/10/2017 11/29/2016  Hearing? N N  Vision? - N  Difficulty concentrating or making decisions? - N  Walking or climbing stairs? - N  Dressing or bathing? - N  Doing errands, shopping? - N  Conservation officer, nature and eating ? N -  Using the Toilet? N -  In the past six months, have you accidently leaked urine? N -  Do you have problems with loss of bowel control? Y -  Managing your Medications? Y -  Managing your Finances? Y -  Housekeeping or managing your Housekeeping? Y -  Some recent data might be hidden    Fall/Depression Screening:  PHQ 2/9 Scores 02/24/2017 01/10/2017 11/29/2016 10/07/2016  PHQ - 2 Score 2 2 5  0  PHQ- 9 Score - 11 17 -      Assessment:   CSW was able to make initial contact with patient today. CSW introduced self/role and reason for CSW consult. CSW is familiar with patient from previous referral and involvement.  Patient reports, "the bills are getting paid" and goes on to share his concerns related to changes he is seeing in his health. He reports he  saw his PCP about a week ago and will be changing to a new provider soon as the practice/office is closing.  Patient encouraged him to seek a new provider and get appointment set up soon. He reports blood work was done at his recent PCP visit and that Cymbalta was increased to 30mg .  He denies feeling depressed or suicidal but does acknowledge "its hard getting old" and some feelings of sadness. Patient shared ways he is coping with his days when he feels overly sad and has good support from his wife as well.  CSW will plan f/u call in 1-2 weeks for further assessment/follow up.  CSW will makes [facility] aware of Insulin issues; he is out until next month- reporting he uses his wife's sometimes.     Plan:  [facility] CM Care Plan Problem One     Most Recent Value  Care Plan Problem One  Patient with financial and mental health needs/support.  Role Documenting the Problem One  Clinical Social Worker  Care Plan for Problem One  Active  [facility] CM Short Term Goal #1   Patient will report improvement in his financial and mental health burdens within 30 days.   [facility] CM Short Term Goal #1 Start Date  05/27/17  Interventions for Short Term Goal #1  CSW encouraged taking anti-depressant as ordered by PCP and seeking ways to save money.    [facility] CM Care Plan Problem Three     Most Recent Value  Care Plan Problem Three  Noncompliance with self monitoring of blood glucose and blood pressure  Role Documenting the Problem Three  Clinical Pharmacist  Care Plan for Problem Three  Active  [facility] CM Short Term Goal #1   Patient will check blood pressure at least 5 times per week over the next 14 days as evidenced by BP readings at next visit  [facility] CM Short Term Goal #1 Start Date  02/09/17  Interventions for Short Term Goal #1  Counseled on importance of checking blood pressure at home to better manage hypertension  [facility] CM Short Term Goal #2   Patient will check CBGs at least twice daily over the next 14 days and write values down as measured by CBG readings at next visit  [facility] CM Short Term Goal #2 Start Date  02/09/17  Interventions for Short Term Goal #2  Discussed importance of checking CBGs. Counseled on hypoglycemia.        Eduard Clos, MSW, [place] Worker  [place] [phone]

## 2017-06-01 ENCOUNTER — Other Ambulatory Visit: Payer: Self-pay | Admitting: Pharmacist

## 2017-06-01 NOTE — Patient Outreach (Addendum)
[place] [facility]) Care Management  06/01/2017  [name] [name] [birth year] [id number]   66 year old male referred to [place] for medication management and medication assistance. Received notification from [facility] LCSW that patient is currently out of his insulin.  Called patient today to access need for medication assistance.  Patient states that he purchased a vial of Novolin 70/30 for both himself and his wife at the first of the month - as she has been out of insulin as well.  Patient states that he has not been checking his blood sugars.  He states he did go see his primary care provider [name] Poli, PA last week.  She does not have samples of insulin as her office is moving and patient will need to transition care to [place] (received letter several months ago).  Patient denies scheduling visit with [place] office at this time.  He states he has bills to pay and money is tight and he may be able to afford his insulin at the first of the month.  Patient still does not consider his insulin to be one of his bills.  Patient does report adherence to his other medications.    Called [name] Poli, PA's office [facility] in [place]) and her nurse confirmed her office is closing.  She states they do not have any samples but if patient schedules an appointment in [place] he may be able to obtain samples.    [facility] in [place] [phone]) and spoke with staff who state they cannot provide samples until patient schedules appointment to establish care with another provider.    Patient reports he has not been checking his CBGs or home blood pressure.   Assessment:  Medication Assistance: [facility] Medicare patient that cannot afford the copay for his insulin or his Januvia. Patient is currently in the Medicare deductible phase.Patient has been denied low income subsidy/extra help. Patient eligible for Tier 1/2 90 day $0 copay (exceptions  include 70/30 insulin and Januvia which are $47/month copay) for his other medications. [facility] pharmacy previously purchased insulin with [facility] emergency fund until patient can improve his financial situation for a total supply of 4 months of insulin.    Patient has not been progressing through care plan. Patient has not been regularly checking his CBGs or blood pressure values at home.  During last visit he did not take refill his medications which some of them had adequate refills.  Despite frequent [facility] pharmacy intervention patient has not been able to progress through his care plan.   Plan:  -Discussed with patient that [place] could not assist with purchasing his insulin at this time as we have previously assisted with [facility] emergency fund for 4 months allowing patient opportunity to manage his finances.  Patient has moved into lower income housing but still states he cannot afford his insulin and must pay other "bills" -Instructed patient to call and establish appointment with new provider with [facility] in [place] and to ask for samples of insulin to bridge to next appointment -[facility] will close case due to patient not progressing through care plan.  Will notify other care team members and primary care provider.    Bennye Alm, PharmD, BCPS [facility] PGY2 Pharmacy Resident [phone]  [facility] CM Care Plan Problem One     Most Recent Value  Care Plan Problem One  Medication adherence related to chronic disease state managment  Role Documenting the Problem One  Clinical Pharmacist  Care Plan for  Problem One  Active  THN CM Short Term Goal #1   Patient will have adherence to his medications >80% of the time per patient report  THN CM Short Term Goal #1 Start Date  05/11/17  Interventions for Short Term Goal #1  Counseled on medication adherence  THN CM Short Term Goal #2   Patient will check blood pressure and blood glucose as directed over the next 30 days  THN CM Short Term Goal #2 Start Date   05/11/17  Interventions for Short Term Goal #2  discussed importance of checking blood pressue and blood glucose in maintaining control of diabetes and hypertension    THN CM Care Plan Problem Three     Most Recent Value  Care Plan Problem Three  Noncompliance with self monitoring of blood glucose and blood pressure  Role Documenting the Problem Three  Clinical Pharmacist  Care Plan for Problem Three  Active  THN CM Short Term Goal #1   Patient will check blood pressure at least 5 times per week over the next 14 days as evidenced by BP readings at next visit  THN CM Short Term Goal #1 Start Date  02/09/17  Interventions for Short Term Goal #1  Counseled on importance of checking blood pressure at home to better manage hypertension  THN CM Short Term Goal #2   Patient will check CBGs at least twice daily over the next 14 days and write values down as measured by CBG readings at next visit  [facility] CM Short Term Goal #2 Start Date  02/09/17  Interventions for Short Term Goal #2  Discussed importance of checking CBGs. Counseled on hypoglycemia.

## 2017-06-10 ENCOUNTER — Ambulatory Visit: Payer: Self-pay | Admitting: *Deleted

## 2017-06-22 ENCOUNTER — Other Ambulatory Visit: Payer: Self-pay | Admitting: *Deleted

## 2017-06-22 ENCOUNTER — Ambulatory Visit: Payer: Self-pay | Admitting: *Deleted

## 2017-06-22 NOTE — Patient Outreach (Signed)
[place] [facility]) Care Management  06/22/2017  [place] 11-05-1951 [id number]   CSW made phone attempt to reach patient today- no answer, voicemail left for return call. CSW will try again next week.   Eduard Clos, MSW, [place] Worker  [place] [phone]

## 2017-06-23 NOTE — Patient Outreach (Signed)
[name]  [name]  06/23/2017  [name] [name] September 13, 1951 [id number]  Subjective:  "we are doing well- were able to get our insulin and our budgeting is better".  Objective: CSW to assist patient with commuity based resources to aide in her well-being, quality of life and overall safety/needs.    Current Medications:  Current Outpatient Prescriptions  Medication Sig Dispense Refill  . amiodarone (PACERONE) 200 MG tablet Take 1 tablet (200 mg total) by mouth daily. <PLEASE MAKE APPOINTMENT FOR REFILLS> (Patient not taking: Reported on 05/11/2017) 90 tablet 0  . aspirin EC 81 MG tablet Take 81 mg by mouth daily.    Marland Kitchen atorvastatin (LIPITOR) 20 MG tablet Take 1 tablet (20 mg total) by mouth daily at 6 PM. (Patient not taking: Reported on 05/11/2017) 90 tablet 2  . carvedilol (COREG) 25 MG tablet Take 1 tablet (25 mg total) by mouth 2 (two) times daily with a meal. 180 tablet 2  . DULoxetine (CYMBALTA) 20 MG capsule Take 20 mg by mouth daily.    . DULoxetine (CYMBALTA) 30 MG capsule Take 30 mg by mouth daily.    . insulin aspart protamine- aspart (NOVOLOG MIX 70/30) (70-30) 100 UNIT/ML injection Inject 35 Units into the skin 2 (two) times daily with a meal.    . nitroGLYCERIN (NITROSTAT) 0.4 MG SL tablet Place 1 tablet (0.4 mg total) under the tongue every 5 (five) minutes x 3 doses as needed for chest pain. 25 tablet 12  . omeprazole (PRILOSEC) 40 MG capsule Take 40 mg by mouth daily.    . sitaGLIPtin (JANUVIA) 100 MG tablet Take 100 mg by mouth daily.    . tamsulosin (FLOMAX) 0.4 MG CAPS capsule Take 1 capsule (0.4 mg total) by mouth daily. (Patient not taking: Reported on 05/06/2017) 30 capsule 0  . traZODone (DESYREL) 100 MG tablet Take 100 mg by mouth at bedtime as needed for sleep.    . valsartan (DIOVAN) 160 MG tablet Take 1 tablet (160 mg total) by mouth daily. 30 tablet 6  . warfarin (COUMADIN) 1 MG tablet Take 1 tablet (1 mg total) by  mouth one time only at 6 PM. 30 tablet 0   No current facility-administered medications for this visit.     Functional Status:  In your present state of health, do you have any difficulty performing the following activities: 01/10/2017 11/29/2016  Hearing? N N  Vision? - N  Difficulty concentrating or making decisions? - N  Walking or climbing stairs? - N  Dressing or bathing? - N  Doing errands, shopping? - N  Conservation officer, nature and eating ? N -  Using the Toilet? N -  In the past six months, have you accidently leaked urine? N -  Do you have problems with loss of bowel control? Y -  Managing your Medications? Y -  Managing your Finances? Y -  Housekeeping or managing your Housekeeping? Y -  Some recent data might be hidden    Fall/Depression Screening:  Fall Risk  02/24/2017 01/10/2017 12/28/2016  Falls in the past year? No - Yes  Number falls in past yr: - - 2 or more  Injury with Fall? - - Yes  Risk Factor Category  - - High Fall Risk  Risk for fall due to : History of fall(s) History of fall(s) -  Follow up - - Falls prevention discussed   PHQ 2/9 Scores 02/24/2017 01/10/2017 11/29/2016 10/07/2016  PHQ - 2 Score 2 2  5 0  PHQ- 9 Score - 11 17 -    Assessment: CSW spoke to patient and his wife who report they were able to get there insulin and are doing well. Patient reports they had to go into overdraft to pay for their medicines but otherwise are managing their budget/finances better. Patient is feeling well mentally; denies any depression and is hopeful. He reports they have been going to the local food pantry offerings to help with food. CSW encouraged patient to continue focusing on a balanced budget; using the overdraft for emergency/medical needs and utilizing the food pantry/food banks often.  Patient appreciative of CSW support and assistance. CSW discussed case closure as goals have been met. Patient agrees to this and CSW will advise PCP and [facility] team of above plans.    Plan:  [facility] CM Care Plan Problem One     Most Recent Value  Care Plan Problem One  Patient with financial and mental health needs/support.  Role Documenting the Problem One  Clinical Social Worker  Care Plan for Problem One  Active  THN CM Short Term Goal #1   Patient will report improvement in his financial and mental health burdens within 30 days.   THN CM Short Term Goal #1 Start Date  05/27/17  [facility] CM Short Term Goal #1 Met Date  06/23/17  Interventions for Short Term Goal #1  CSW encouraged taking anti-depressant as ordered by PCP and seeking ways to save money.    THN CM Care Plan Problem Three     Most Recent Value  Care Plan Problem Three  Noncompliance with self monitoring of blood glucose and blood pressure  Role Documenting the Problem Three  Clinical Pharmacist  Care Plan for Problem Three  Active  THN CM Short Term Goal #1   Patient will check blood pressure at least 5 times per week over the next 14 days as evidenced by BP readings at next visit  [facility] CM Short Term Goal #1 Start Date  02/09/17  Interventions for Short Term Goal #1  Counseled on importance of checking blood pressure at home to better manage hypertension  THN CM Short Term Goal #2   Patient will check CBGs at least twice daily over the next 14 days and write values down as measured by CBG readings at next visit  [facility] CM Short Term Goal #2 Start Date  02/09/17  Interventions for Short Term Goal #2  Discussed importance of checking CBGs. Counseled on hypoglycemia.        [name] [name], MSW, [place] Worker  [place] [phone]

## 2017-06-27 ENCOUNTER — Ambulatory Visit: Payer: Self-pay | Admitting: *Deleted

## 2017-07-11 ENCOUNTER — Other Ambulatory Visit: Payer: Self-pay | Admitting: *Deleted

## 2017-07-11 ENCOUNTER — Other Ambulatory Visit: Payer: Self-pay | Admitting: Pharmacist

## 2017-07-11 NOTE — Patient Outreach (Signed)
[place] [facility]) Care Management  07/11/2017  [name] [name] 10-Feb-1951 [id number]  Received an urgent in-basket message from [employer], [facility] care management assistant, patient called in regarding a bill for medications.   Patient was previously followed by [place] Resident, and per 06/01/17 note in chart, [place] Resident had discussed with patient [facility] would no longer be able to purchase his insulin and his case was closed due to not progressing through care plan.   Attempted to reach patient, no answer, HIPAA compliant message left requesting return call.   Plan:  If no return call, will make a second outreach attempt to patient within the next week.   Karrie Meres, PharmD, [place] [phone]

## 2017-07-11 NOTE — Patient Outreach (Signed)
[name] [name] [facility]) Care Management  07/11/2017  [place] 1951-07-05 [id number]   CSW spoke to patient who reports he was told there would be no co-pay but received a bill for $80 from PCP visits.  CSW offered to call PCP office to inquire about this bill- he agrees to this- [phone]. CSW spoke with office manager at [facility] Santiago Glad) who states he has no outstanding bill with any of the [employer]. CSW  Updated patient and reminded him his next appointment with them is 07/20/17 at [place], MSW, [place] Worker  [place] [phone]

## 2017-07-14 ENCOUNTER — Other Ambulatory Visit: Payer: Self-pay | Admitting: Pharmacist

## 2017-07-14 NOTE — Patient Outreach (Signed)
[place] [facility]) Care Management  07/14/2017  [place] 1950/12/17 [id number]  Second unsuccessful phone outreach to patient.  HIPAA compliant message left requesting return call.   Plan:  If no return call, will make third outreach attempt next week.   Karrie Meres, PharmD, [place] [phone]

## 2017-07-18 ENCOUNTER — Other Ambulatory Visit: Payer: Self-pay | Admitting: Pharmacist

## 2017-07-18 NOTE — Patient Outreach (Signed)
[place] [facility]) Care Management  07/18/2017  [place] 04/21/1951 [id number]  Third unsuccessful phone outreach to patient.  No answer, HIPAA compliant message left for patient requesting return call.    Plan:  Will send patient outreach letter.  If no response from patient in 10 business days, will close case.   Karrie Meres, PharmD, [place] [phone]

## 2017-07-20 ENCOUNTER — Ambulatory Visit (INDEPENDENT_AMBULATORY_CARE_PROVIDER_SITE_OTHER): Payer: Medicare HMO | Admitting: *Deleted

## 2017-07-20 DIAGNOSIS — I255 Ischemic cardiomyopathy: Secondary | ICD-10-CM

## 2017-07-20 DIAGNOSIS — F329 Major depressive disorder, single episode, unspecified: Secondary | ICD-10-CM | POA: Diagnosis not present

## 2017-07-20 DIAGNOSIS — E785 Hyperlipidemia, unspecified: Secondary | ICD-10-CM | POA: Diagnosis not present

## 2017-07-20 DIAGNOSIS — K219 Gastro-esophageal reflux disease without esophagitis: Secondary | ICD-10-CM | POA: Diagnosis not present

## 2017-07-20 DIAGNOSIS — E119 Type 2 diabetes mellitus without complications: Secondary | ICD-10-CM | POA: Diagnosis not present

## 2017-07-20 DIAGNOSIS — I10 Essential (primary) hypertension: Secondary | ICD-10-CM | POA: Diagnosis not present

## 2017-07-20 DIAGNOSIS — Z6834 Body mass index (BMI) 34.0-34.9, adult: Secondary | ICD-10-CM | POA: Diagnosis not present

## 2017-07-20 DIAGNOSIS — Z5181 Encounter for therapeutic drug level monitoring: Secondary | ICD-10-CM | POA: Diagnosis not present

## 2017-07-20 NOTE — Progress Notes (Signed)
Remote ICD transmission.   

## 2017-07-21 ENCOUNTER — Other Ambulatory Visit: Payer: Self-pay | Admitting: Pharmacist

## 2017-07-21 ENCOUNTER — Encounter: Payer: Self-pay | Admitting: Cardiology

## 2017-07-21 NOTE — Patient Outreach (Signed)
[place] [facility]) Care Management  07/21/2017  [place] [birth year] [id number]  Received a message back from patient after outreach letter was mailed to him.    Attempted to return call, phone went directly to voicemail.  A HIPAA compliant message was left requesting return call.   Plan:  Will make outreach attempt next week.   Karrie Meres, PharmD, [place] [phone]

## 2017-07-25 ENCOUNTER — Other Ambulatory Visit: Payer: Self-pay | Admitting: Pharmacist

## 2017-07-25 NOTE — Patient Outreach (Signed)
[place] [facility]) Care Management  07/25/2017  [place] Jelinski [place] [birth year] [id number]  1103:  Unsuccessful phone outreach to patient.  Patient had left [facility] Pharmacist a voicemail returning previous call.   HIPAA compliant message was left requesting a return call from patient.   1345:  Received call back from patient.  HIPAA details verified with patient.  Explained to patient [facility] Pharmacist received message he had a question about a bill for medication.  Patient reports his bill was actually for co-pays at his [place] office---he reports [facility] LCSW had helped him regarding this last month---note in chart from 07/11/17 from [place] confirms this.    Regarding medications---patient reports he was using Reli-On insulin from [place].  He reports prescriber wanted to change his insulin, he doesn't remember name, but it was $242 per his report.  Discussed his [employer] [place]-PDP appears to have $195 deductible for Tier 3-5 medications, insulin is Tier 3, and that may be why his co-pay was so high due to needing to meet deductible.    Counseled patient on medication deductible being set by Medicare for Medicare Part D plans.  Discussed patient assistance programs for insulin manufacturers all require Part D beneficiaries spend a specified amount of money on prescriptions in order to qualify for manufacturer patient assistance programs.  He reports he has not met requirements.    He reports he is continuing to purchase Reli-On insulin at [employer].    He denies pharmacy related needs at this time.    Plan:  Will close pharmacy episode.    Patient aware he can contact [facility] Pharmacist if he has new pharmacy related concerns.    Karrie Meres, PharmD, [place] [phone]

## 2017-07-26 LAB — CUP PACEART REMOTE DEVICE CHECK
Battery Remaining Longevity: 58 mo
Brady Statistic AP VS Percent: 1.1 %
Brady Statistic AS VP Percent: 1 %
Brady Statistic AS VS Percent: 98 %
HighPow Impedance: 47 Ohm
Implantable Lead Implant Date: 20070516
Implantable Lead Location: 753860
Lead Channel Pacing Threshold Amplitude: 0.5 V
Lead Channel Pacing Threshold Pulse Width: 0.5 ms
Lead Channel Pacing Threshold Pulse Width: 0.5 ms
Lead Channel Sensing Intrinsic Amplitude: 2.6 mV
Lead Channel Setting Pacing Amplitude: 2.5 V
MDC IDC LEAD IMPLANT DT: 20070516
MDC IDC LEAD LOCATION: 753859
MDC IDC MSMT BATTERY REMAINING PERCENTAGE: 53 %
MDC IDC MSMT BATTERY VOLTAGE: 2.93 V
MDC IDC MSMT LEADCHNL RA IMPEDANCE VALUE: 410 Ohm
MDC IDC MSMT LEADCHNL RV IMPEDANCE VALUE: 340 Ohm
MDC IDC MSMT LEADCHNL RV PACING THRESHOLD AMPLITUDE: 1.25 V
MDC IDC MSMT LEADCHNL RV SENSING INTR AMPL: 11.9 mV
MDC IDC PG IMPLANT DT: 20140407
MDC IDC PG SERIAL: 1056971
MDC IDC SESS DTM: 20180808093423
MDC IDC SET LEADCHNL RA PACING AMPLITUDE: 2 V
MDC IDC SET LEADCHNL RV PACING PULSEWIDTH: 0.5 ms
MDC IDC SET LEADCHNL RV SENSING SENSITIVITY: 0.5 mV
MDC IDC STAT BRADY AP VP PERCENT: 1 %
MDC IDC STAT BRADY RA PERCENT PACED: 1 %
MDC IDC STAT BRADY RV PERCENT PACED: 1 %

## 2017-08-08 ENCOUNTER — Encounter: Payer: Self-pay | Admitting: Cardiology

## 2017-08-09 ENCOUNTER — Telehealth: Payer: Self-pay | Admitting: Internal Medicine

## 2017-08-09 NOTE — Telephone Encounter (Signed)
New message    Pt is calling stating that he is having trouble with dizziness. He isn't sure if it's his heart or something else. He said some times he has some chest tightness.

## 2017-08-09 NOTE — Telephone Encounter (Signed)
Returned call to patient no answer.LMTC. 

## 2017-08-09 NOTE — Telephone Encounter (Signed)
Patient was due to see Dr Martinique in May.  Will forward to his nurse to address

## 2017-08-10 NOTE — Telephone Encounter (Signed)
Returned call to patient no answer.LMTC. 

## 2017-08-11 NOTE — Telephone Encounter (Signed)
Returned call to patient no answer.LMTC. 

## 2017-08-17 NOTE — Telephone Encounter (Signed)
Returned call to patient no answer.LMTC. 

## 2017-08-19 NOTE — Telephone Encounter (Signed)
Patient never returned phone call

## 2017-09-08 DIAGNOSIS — E1165 Type 2 diabetes mellitus with hyperglycemia: Secondary | ICD-10-CM | POA: Diagnosis not present

## 2017-09-08 DIAGNOSIS — E119 Type 2 diabetes mellitus without complications: Secondary | ICD-10-CM | POA: Diagnosis not present

## 2017-09-08 DIAGNOSIS — I10 Essential (primary) hypertension: Secondary | ICD-10-CM | POA: Diagnosis not present

## 2017-09-08 DIAGNOSIS — Z6834 Body mass index (BMI) 34.0-34.9, adult: Secondary | ICD-10-CM | POA: Diagnosis not present

## 2017-09-08 DIAGNOSIS — E785 Hyperlipidemia, unspecified: Secondary | ICD-10-CM | POA: Diagnosis not present

## 2017-09-08 DIAGNOSIS — Z5181 Encounter for therapeutic drug level monitoring: Secondary | ICD-10-CM | POA: Diagnosis not present

## 2017-09-08 DIAGNOSIS — Z23 Encounter for immunization: Secondary | ICD-10-CM | POA: Diagnosis not present

## 2017-09-14 DIAGNOSIS — E1165 Type 2 diabetes mellitus with hyperglycemia: Secondary | ICD-10-CM | POA: Diagnosis not present

## 2017-09-14 DIAGNOSIS — Z6834 Body mass index (BMI) 34.0-34.9, adult: Secondary | ICD-10-CM | POA: Diagnosis not present

## 2017-09-14 DIAGNOSIS — E1143 Type 2 diabetes mellitus with diabetic autonomic (poly)neuropathy: Secondary | ICD-10-CM | POA: Diagnosis not present

## 2017-09-14 DIAGNOSIS — N509 Disorder of male genital organs, unspecified: Secondary | ICD-10-CM | POA: Diagnosis not present

## 2017-10-10 ENCOUNTER — Encounter: Payer: Self-pay | Admitting: *Deleted

## 2017-10-18 DIAGNOSIS — Z5181 Encounter for therapeutic drug level monitoring: Secondary | ICD-10-CM | POA: Diagnosis not present

## 2017-10-18 DIAGNOSIS — I10 Essential (primary) hypertension: Secondary | ICD-10-CM | POA: Diagnosis not present

## 2017-10-18 DIAGNOSIS — I4891 Unspecified atrial fibrillation: Secondary | ICD-10-CM | POA: Diagnosis not present

## 2017-10-18 DIAGNOSIS — E1165 Type 2 diabetes mellitus with hyperglycemia: Secondary | ICD-10-CM | POA: Diagnosis not present

## 2017-10-18 DIAGNOSIS — Z6837 Body mass index (BMI) 37.0-37.9, adult: Secondary | ICD-10-CM | POA: Diagnosis not present

## 2017-10-18 DIAGNOSIS — N183 Chronic kidney disease, stage 3 (moderate): Secondary | ICD-10-CM | POA: Diagnosis not present

## 2017-10-19 ENCOUNTER — Ambulatory Visit (INDEPENDENT_AMBULATORY_CARE_PROVIDER_SITE_OTHER): Payer: Medicare HMO | Admitting: *Deleted

## 2017-10-19 DIAGNOSIS — I255 Ischemic cardiomyopathy: Secondary | ICD-10-CM

## 2017-10-19 NOTE — Progress Notes (Signed)
Remote ICD transmission.   

## 2017-10-20 ENCOUNTER — Encounter: Payer: Self-pay | Admitting: Cardiology

## 2017-10-24 ENCOUNTER — Encounter: Payer: Medicare HMO | Admitting: Internal Medicine

## 2017-10-31 DIAGNOSIS — S0990XA Unspecified injury of head, initial encounter: Secondary | ICD-10-CM | POA: Diagnosis not present

## 2017-10-31 DIAGNOSIS — I10 Essential (primary) hypertension: Secondary | ICD-10-CM | POA: Diagnosis not present

## 2017-10-31 DIAGNOSIS — I4891 Unspecified atrial fibrillation: Secondary | ICD-10-CM | POA: Diagnosis not present

## 2017-10-31 DIAGNOSIS — R55 Syncope and collapse: Secondary | ICD-10-CM | POA: Diagnosis not present

## 2017-10-31 DIAGNOSIS — N183 Chronic kidney disease, stage 3 (moderate): Secondary | ICD-10-CM | POA: Diagnosis not present

## 2017-10-31 DIAGNOSIS — E860 Dehydration: Secondary | ICD-10-CM | POA: Diagnosis not present

## 2017-10-31 DIAGNOSIS — R634 Abnormal weight loss: Secondary | ICD-10-CM | POA: Diagnosis not present

## 2017-10-31 DIAGNOSIS — Z5181 Encounter for therapeutic drug level monitoring: Secondary | ICD-10-CM | POA: Diagnosis not present

## 2017-10-31 DIAGNOSIS — Z7901 Long term (current) use of anticoagulants: Secondary | ICD-10-CM | POA: Diagnosis not present

## 2017-10-31 DIAGNOSIS — I252 Old myocardial infarction: Secondary | ICD-10-CM | POA: Diagnosis not present

## 2017-10-31 DIAGNOSIS — I951 Orthostatic hypotension: Secondary | ICD-10-CM | POA: Diagnosis not present

## 2017-10-31 DIAGNOSIS — I2510 Atherosclerotic heart disease of native coronary artery without angina pectoris: Secondary | ICD-10-CM | POA: Diagnosis not present

## 2017-10-31 DIAGNOSIS — M25511 Pain in right shoulder: Secondary | ICD-10-CM | POA: Diagnosis not present

## 2017-10-31 DIAGNOSIS — Z6834 Body mass index (BMI) 34.0-34.9, adult: Secondary | ICD-10-CM | POA: Diagnosis not present

## 2017-10-31 DIAGNOSIS — S199XXA Unspecified injury of neck, initial encounter: Secondary | ICD-10-CM | POA: Diagnosis not present

## 2017-10-31 DIAGNOSIS — Z955 Presence of coronary angioplasty implant and graft: Secondary | ICD-10-CM | POA: Diagnosis not present

## 2017-10-31 DIAGNOSIS — S4991XA Unspecified injury of right shoulder and upper arm, initial encounter: Secondary | ICD-10-CM | POA: Diagnosis not present

## 2017-11-01 DIAGNOSIS — I4891 Unspecified atrial fibrillation: Secondary | ICD-10-CM | POA: Diagnosis not present

## 2017-11-01 DIAGNOSIS — Z7901 Long term (current) use of anticoagulants: Secondary | ICD-10-CM | POA: Diagnosis not present

## 2017-11-01 DIAGNOSIS — R55 Syncope and collapse: Secondary | ICD-10-CM | POA: Diagnosis not present

## 2017-11-01 DIAGNOSIS — E860 Dehydration: Secondary | ICD-10-CM | POA: Diagnosis not present

## 2017-11-01 DIAGNOSIS — N183 Chronic kidney disease, stage 3 (moderate): Secondary | ICD-10-CM | POA: Diagnosis not present

## 2017-11-01 DIAGNOSIS — I951 Orthostatic hypotension: Secondary | ICD-10-CM | POA: Diagnosis not present

## 2017-11-06 LAB — CUP PACEART REMOTE DEVICE CHECK
Battery Voltage: 2.93 V
Brady Statistic AS VP Percent: 1 %
HIGH POWER IMPEDANCE MEASURED VALUE: 49 Ohm
Implantable Lead Implant Date: 20070516
Implantable Lead Location: 753859
Lead Channel Impedance Value: 350 Ohm
Lead Channel Pacing Threshold Amplitude: 1.25 V
Lead Channel Pacing Threshold Pulse Width: 0.5 ms
Lead Channel Sensing Intrinsic Amplitude: 11.9 mV
Lead Channel Sensing Intrinsic Amplitude: 2.8 mV
Lead Channel Setting Pacing Amplitude: 2.5 V
Lead Channel Setting Pacing Pulse Width: 0.5 ms
Lead Channel Setting Sensing Sensitivity: 0.5 mV
MDC IDC LEAD IMPLANT DT: 20070516
MDC IDC LEAD LOCATION: 753860
MDC IDC MSMT BATTERY REMAINING LONGEVITY: 55 mo
MDC IDC MSMT BATTERY REMAINING PERCENTAGE: 50 %
MDC IDC MSMT LEADCHNL RA IMPEDANCE VALUE: 430 Ohm
MDC IDC MSMT LEADCHNL RA PACING THRESHOLD AMPLITUDE: 0.5 V
MDC IDC MSMT LEADCHNL RA PACING THRESHOLD PULSEWIDTH: 0.5 ms
MDC IDC PG IMPLANT DT: 20140407
MDC IDC SESS DTM: 20181107100606
MDC IDC SET LEADCHNL RA PACING AMPLITUDE: 2 V
MDC IDC STAT BRADY AP VP PERCENT: 1 %
MDC IDC STAT BRADY AP VS PERCENT: 1 %
MDC IDC STAT BRADY AS VS PERCENT: 99 %
MDC IDC STAT BRADY RA PERCENT PACED: 1 %
MDC IDC STAT BRADY RV PERCENT PACED: 1 %
Pulse Gen Serial Number: 1056971

## 2017-11-11 DIAGNOSIS — Z79899 Other long term (current) drug therapy: Secondary | ICD-10-CM | POA: Diagnosis not present

## 2017-11-11 DIAGNOSIS — R791 Abnormal coagulation profile: Secondary | ICD-10-CM | POA: Diagnosis not present

## 2017-11-11 DIAGNOSIS — Z6835 Body mass index (BMI) 35.0-35.9, adult: Secondary | ICD-10-CM | POA: Diagnosis not present

## 2017-11-11 DIAGNOSIS — R55 Syncope and collapse: Secondary | ICD-10-CM | POA: Diagnosis not present

## 2017-11-11 DIAGNOSIS — Z09 Encounter for follow-up examination after completed treatment for conditions other than malignant neoplasm: Secondary | ICD-10-CM | POA: Diagnosis not present

## 2017-12-22 DIAGNOSIS — I10 Essential (primary) hypertension: Secondary | ICD-10-CM | POA: Diagnosis not present

## 2017-12-22 DIAGNOSIS — E119 Type 2 diabetes mellitus without complications: Secondary | ICD-10-CM | POA: Diagnosis not present

## 2017-12-22 DIAGNOSIS — E785 Hyperlipidemia, unspecified: Secondary | ICD-10-CM | POA: Diagnosis not present

## 2017-12-22 DIAGNOSIS — Z7901 Long term (current) use of anticoagulants: Secondary | ICD-10-CM | POA: Diagnosis not present

## 2017-12-23 ENCOUNTER — Encounter: Payer: Medicare HMO | Admitting: Internal Medicine

## 2017-12-26 ENCOUNTER — Encounter: Payer: Self-pay | Admitting: Internal Medicine

## 2018-01-05 DIAGNOSIS — Z7901 Long term (current) use of anticoagulants: Secondary | ICD-10-CM | POA: Diagnosis not present

## 2018-01-18 ENCOUNTER — Ambulatory Visit (INDEPENDENT_AMBULATORY_CARE_PROVIDER_SITE_OTHER): Payer: Medicare HMO | Admitting: *Deleted

## 2018-01-18 DIAGNOSIS — I255 Ischemic cardiomyopathy: Secondary | ICD-10-CM | POA: Diagnosis not present

## 2018-01-18 NOTE — Progress Notes (Signed)
Remote ICD transmission.   

## 2018-01-19 ENCOUNTER — Encounter: Payer: Self-pay | Admitting: Cardiology

## 2018-01-19 DIAGNOSIS — I4891 Unspecified atrial fibrillation: Secondary | ICD-10-CM | POA: Diagnosis not present

## 2018-01-26 DIAGNOSIS — H25813 Combined forms of age-related cataract, bilateral: Secondary | ICD-10-CM | POA: Diagnosis not present

## 2018-01-26 DIAGNOSIS — E103291 Type 1 diabetes mellitus with mild nonproliferative diabetic retinopathy without macular edema, right eye: Secondary | ICD-10-CM | POA: Diagnosis not present

## 2018-01-26 DIAGNOSIS — Z0100 Encounter for examination of eyes and vision without abnormal findings: Secondary | ICD-10-CM | POA: Diagnosis not present

## 2018-02-17 DIAGNOSIS — N390 Urinary tract infection, site not specified: Secondary | ICD-10-CM | POA: Diagnosis not present

## 2018-02-17 DIAGNOSIS — Z7901 Long term (current) use of anticoagulants: Secondary | ICD-10-CM | POA: Diagnosis not present

## 2018-02-20 LAB — CUP PACEART REMOTE DEVICE CHECK
Battery Voltage: 2.92 V
Brady Statistic AP VP Percent: 1 %
Brady Statistic AS VP Percent: 1 %
Brady Statistic AS VS Percent: 99 %
Brady Statistic RV Percent Paced: 1 %
HighPow Impedance: 51 Ohm
Implantable Lead Implant Date: 20070516
Implantable Lead Location: 753859
Implantable Lead Model: 7001
Implantable Pulse Generator Implant Date: 20140407
Lead Channel Impedance Value: 430 Ohm
Lead Channel Pacing Threshold Amplitude: 1.25 V
Lead Channel Pacing Threshold Pulse Width: 0.5 ms
Lead Channel Pacing Threshold Pulse Width: 0.5 ms
Lead Channel Sensing Intrinsic Amplitude: 11.9 mV
Lead Channel Sensing Intrinsic Amplitude: 2.6 mV
Lead Channel Setting Pacing Amplitude: 2 V
Lead Channel Setting Pacing Amplitude: 2.5 V
Lead Channel Setting Pacing Pulse Width: 0.5 ms
MDC IDC LEAD IMPLANT DT: 20070516
MDC IDC LEAD LOCATION: 753860
MDC IDC MSMT BATTERY REMAINING LONGEVITY: 52 mo
MDC IDC MSMT BATTERY REMAINING PERCENTAGE: 48 %
MDC IDC MSMT LEADCHNL RA PACING THRESHOLD AMPLITUDE: 0.5 V
MDC IDC MSMT LEADCHNL RV IMPEDANCE VALUE: 380 Ohm
MDC IDC SESS DTM: 20190206103821
MDC IDC SET LEADCHNL RV SENSING SENSITIVITY: 0.5 mV
MDC IDC STAT BRADY AP VS PERCENT: 1 %
MDC IDC STAT BRADY RA PERCENT PACED: 1 %
Pulse Gen Serial Number: 1056971

## 2018-03-03 DIAGNOSIS — I2510 Atherosclerotic heart disease of native coronary artery without angina pectoris: Secondary | ICD-10-CM | POA: Diagnosis not present

## 2018-03-03 DIAGNOSIS — I10 Essential (primary) hypertension: Secondary | ICD-10-CM | POA: Diagnosis not present

## 2018-03-03 DIAGNOSIS — R791 Abnormal coagulation profile: Secondary | ICD-10-CM | POA: Diagnosis not present

## 2018-03-03 DIAGNOSIS — I509 Heart failure, unspecified: Secondary | ICD-10-CM | POA: Diagnosis not present

## 2018-03-03 DIAGNOSIS — R0602 Shortness of breath: Secondary | ICD-10-CM | POA: Diagnosis not present

## 2018-03-03 DIAGNOSIS — Z955 Presence of coronary angioplasty implant and graft: Secondary | ICD-10-CM | POA: Diagnosis not present

## 2018-03-03 DIAGNOSIS — E785 Hyperlipidemia, unspecified: Secondary | ICD-10-CM | POA: Diagnosis not present

## 2018-03-03 DIAGNOSIS — Z79899 Other long term (current) drug therapy: Secondary | ICD-10-CM | POA: Diagnosis not present

## 2018-03-03 DIAGNOSIS — Z95810 Presence of automatic (implantable) cardiac defibrillator: Secondary | ICD-10-CM | POA: Diagnosis not present

## 2018-03-03 DIAGNOSIS — R079 Chest pain, unspecified: Secondary | ICD-10-CM | POA: Diagnosis not present

## 2018-03-03 DIAGNOSIS — I249 Acute ischemic heart disease, unspecified: Secondary | ICD-10-CM | POA: Diagnosis not present

## 2018-03-03 DIAGNOSIS — E119 Type 2 diabetes mellitus without complications: Secondary | ICD-10-CM | POA: Diagnosis not present

## 2018-03-03 DIAGNOSIS — I480 Paroxysmal atrial fibrillation: Secondary | ICD-10-CM | POA: Diagnosis not present

## 2018-03-03 DIAGNOSIS — Z7901 Long term (current) use of anticoagulants: Secondary | ICD-10-CM | POA: Diagnosis not present

## 2018-03-04 DIAGNOSIS — R079 Chest pain, unspecified: Secondary | ICD-10-CM | POA: Diagnosis not present

## 2018-03-07 DIAGNOSIS — E119 Type 2 diabetes mellitus without complications: Secondary | ICD-10-CM | POA: Diagnosis not present

## 2018-03-07 DIAGNOSIS — Z125 Encounter for screening for malignant neoplasm of prostate: Secondary | ICD-10-CM | POA: Diagnosis not present

## 2018-03-07 DIAGNOSIS — M5440 Lumbago with sciatica, unspecified side: Secondary | ICD-10-CM | POA: Diagnosis not present

## 2018-03-07 DIAGNOSIS — I509 Heart failure, unspecified: Secondary | ICD-10-CM | POA: Diagnosis not present

## 2018-03-07 DIAGNOSIS — Z09 Encounter for follow-up examination after completed treatment for conditions other than malignant neoplasm: Secondary | ICD-10-CM | POA: Diagnosis not present

## 2018-03-15 ENCOUNTER — Encounter: Payer: Medicare HMO | Admitting: Internal Medicine

## 2018-03-20 DIAGNOSIS — M5440 Lumbago with sciatica, unspecified side: Secondary | ICD-10-CM | POA: Diagnosis not present

## 2018-03-20 DIAGNOSIS — M545 Low back pain: Secondary | ICD-10-CM | POA: Diagnosis not present

## 2018-03-22 DIAGNOSIS — Z0000 Encounter for general adult medical examination without abnormal findings: Secondary | ICD-10-CM | POA: Diagnosis not present

## 2018-03-22 DIAGNOSIS — R5383 Other fatigue: Secondary | ICD-10-CM | POA: Diagnosis not present

## 2018-03-22 DIAGNOSIS — Z125 Encounter for screening for malignant neoplasm of prostate: Secondary | ICD-10-CM | POA: Diagnosis not present

## 2018-03-22 DIAGNOSIS — I509 Heart failure, unspecified: Secondary | ICD-10-CM | POA: Diagnosis not present

## 2018-03-22 DIAGNOSIS — B379 Candidiasis, unspecified: Secondary | ICD-10-CM | POA: Diagnosis not present

## 2018-03-22 DIAGNOSIS — Z7901 Long term (current) use of anticoagulants: Secondary | ICD-10-CM | POA: Diagnosis not present

## 2018-03-22 DIAGNOSIS — E119 Type 2 diabetes mellitus without complications: Secondary | ICD-10-CM | POA: Diagnosis not present

## 2018-03-22 DIAGNOSIS — Z1331 Encounter for screening for depression: Secondary | ICD-10-CM | POA: Diagnosis not present

## 2018-03-22 DIAGNOSIS — Z136 Encounter for screening for cardiovascular disorders: Secondary | ICD-10-CM | POA: Diagnosis not present

## 2018-03-22 DIAGNOSIS — E785 Hyperlipidemia, unspecified: Secondary | ICD-10-CM | POA: Diagnosis not present

## 2018-03-23 DIAGNOSIS — E669 Obesity, unspecified: Secondary | ICD-10-CM | POA: Diagnosis not present

## 2018-03-23 DIAGNOSIS — Z6834 Body mass index (BMI) 34.0-34.9, adult: Secondary | ICD-10-CM | POA: Diagnosis not present

## 2018-04-19 ENCOUNTER — Ambulatory Visit (INDEPENDENT_AMBULATORY_CARE_PROVIDER_SITE_OTHER): Payer: Medicare HMO | Admitting: *Deleted

## 2018-04-19 DIAGNOSIS — I255 Ischemic cardiomyopathy: Secondary | ICD-10-CM | POA: Diagnosis not present

## 2018-04-19 NOTE — Progress Notes (Signed)
Remote ICD transmission.   

## 2018-04-20 ENCOUNTER — Encounter: Payer: Self-pay | Admitting: Cardiology

## 2018-04-24 ENCOUNTER — Ambulatory Visit (INDEPENDENT_AMBULATORY_CARE_PROVIDER_SITE_OTHER): Payer: Medicare HMO | Admitting: Internal Medicine

## 2018-04-24 ENCOUNTER — Encounter: Payer: Self-pay | Admitting: Internal Medicine

## 2018-04-24 VITALS — BP 126/84 | HR 92 | Ht 71.0 in | Wt 237.0 lb

## 2018-04-24 DIAGNOSIS — I255 Ischemic cardiomyopathy: Secondary | ICD-10-CM

## 2018-04-24 DIAGNOSIS — Z95810 Presence of automatic (implantable) cardiac defibrillator: Secondary | ICD-10-CM

## 2018-04-24 DIAGNOSIS — I4819 Other persistent atrial fibrillation: Secondary | ICD-10-CM

## 2018-04-24 DIAGNOSIS — I481 Persistent atrial fibrillation: Secondary | ICD-10-CM | POA: Diagnosis not present

## 2018-04-24 DIAGNOSIS — I10 Essential (primary) hypertension: Secondary | ICD-10-CM | POA: Diagnosis not present

## 2018-04-24 DIAGNOSIS — I5042 Chronic combined systolic (congestive) and diastolic (congestive) heart failure: Secondary | ICD-10-CM

## 2018-04-24 LAB — CUP PACEART INCLINIC DEVICE CHECK
Brady Statistic RV Percent Paced: 0.07 %
HIGH POWER IMPEDANCE MEASURED VALUE: 52.0938
Implantable Lead Implant Date: 20070516
Implantable Lead Location: 753859
Implantable Lead Location: 753860
Implantable Lead Model: 7001
Implantable Pulse Generator Implant Date: 20140407
Lead Channel Impedance Value: 425 Ohm
Lead Channel Pacing Threshold Amplitude: 1.25 V
Lead Channel Pacing Threshold Pulse Width: 0.5 ms
Lead Channel Pacing Threshold Pulse Width: 0.5 ms
Lead Channel Pacing Threshold Pulse Width: 0.5 ms
Lead Channel Sensing Intrinsic Amplitude: 11.9 mV
Lead Channel Sensing Intrinsic Amplitude: 2.8 mV
Lead Channel Setting Pacing Amplitude: 2 V
Lead Channel Setting Pacing Amplitude: 2.5 V
Lead Channel Setting Pacing Pulse Width: 0.5 ms
MDC IDC LEAD IMPLANT DT: 20070516
MDC IDC MSMT BATTERY REMAINING LONGEVITY: 54 mo
MDC IDC MSMT LEADCHNL RA PACING THRESHOLD AMPLITUDE: 0.5 V
MDC IDC MSMT LEADCHNL RA PACING THRESHOLD AMPLITUDE: 0.5 V
MDC IDC MSMT LEADCHNL RA PACING THRESHOLD PULSEWIDTH: 0.5 ms
MDC IDC MSMT LEADCHNL RV IMPEDANCE VALUE: 375 Ohm
MDC IDC MSMT LEADCHNL RV PACING THRESHOLD AMPLITUDE: 1.25 V
MDC IDC PG SERIAL: 1056971
MDC IDC SESS DTM: 20190513123320
MDC IDC SET LEADCHNL RV SENSING SENSITIVITY: 0.5 mV
MDC IDC STAT BRADY RA PERCENT PACED: 0.44 %

## 2018-04-24 NOTE — Patient Instructions (Addendum)
Medication Instructions:  Your physician recommends that you continue on your current medications as directed. Please refer to the Current Medication list given to you today.  Labwork: You will get lab work today:  CBC, CMET, TSH and BNP  Testing/Procedures: None ordered.  Follow-Up:  Please schedule appointment with Dr. Martinique as soon as opening available (Pt overdue)  Your physician wants you to follow-up in: one year with Tommye Standard, PA.   You will receive a reminder letter in the mail two months in advance. If you don't receive a letter, please call our office to schedule the follow-up appointment.  Remote monitoring is used to monitor your ICD from home. This monitoring reduces the number of office visits required to check your device to one time per year. It allows [place] to keep an eye on the functioning of your device to ensure it is working properly. You are scheduled for a device check from home on 07/19/2018. You may send your transmission at any time that day. If you have a wireless device, the transmission will be sent automatically. After your physician reviews your transmission, you will receive a postcard with your next transmission date.  Any Other Special Instructions Will Be Listed Below (If Applicable).  If you need a refill on your cardiac medications before your next appointment, please call your pharmacy.

## 2018-04-24 NOTE — Progress Notes (Signed)
PCP: [name], PA Primary Cardiologist: Dr Martinique Primary EP: Dr Rayann Heman  [name] [name] is a 67 y.o. male who presents today for routine electrophysiology followup.  Since last being seen in our clinic, the patient reports doing very well.  + occasional falls.  Today, he denies symptoms of palpitations, chest pain, shortness of breath,  lower extremity edema, dizziness, presyncope, syncope, or ICD shocks.  The patient is otherwise without complaint today.   Past Medical History:  Diagnosis Date  . Anxiety   . Arthritis   . CAD (coronary artery disease)    a.  1993 s/p MI - [employer];  b. s/p BMS to LAD '00;  c. PTCA 2nd diagonal 2010;  d. 02/18/12 Cath: moderate nonobs dzs - med rx;  e.  01/2015 Cath: LM nl, LAD 40-34m ISR, 70-30m/d, d1 90p (3.0x16 Synergy DES), D2 50-60, LCX nl, OM1 50p, 17m (2.5x12 Synergy DES), RCA nl, EF 30-35%.  . Chronic combined systolic and diastolic CHF (congestive heart failure) ([place])    a. 12/2014 Echo: EF 30-35%, Gr2 DD, mod MR, sev dil LA.  . CKD (chronic kidney disease), stage III ([place])   . Depression   . ED (erectile dysfunction)   . GERD (gastroesophageal reflux disease)   . Hyperlipidemia   . Hypertension   . Ischemic cardiomyopathy    a. s/p St. [name] (Atlas) ICD implanted in [place] 2007;  b. 12/2014 Echo: Ef 30-35%.  . MVP (mitral valve prolapse)    a. s/p MV annuloplasty at [facility] 2004.  Marland Kitchen Persistent atrial fibrillation ([place])    a. noted on ICD interrogation '10 - not previously on [place] - CHA2DS2VASc = 5.  . Type II diabetes mellitus ([place])    uncontrolled   Past Surgical History:  Procedure Laterality Date  . CARDIAC DEFIBRILLATOR PLACEMENT  2007   implanted in [place], has a 7001 RV lead and a SJM Atlas ICD followed by Dr Caryl Comes  . CARDIOVERSION N/A 09/29/2016   Procedure: CARDIOVERSION;  Surgeon: Lelon Perla, MD;  Location: [facility] ENDOSCOPY;  Service: Cardiovascular;  Laterality: N/A;  . CARDIOVERSION N/A 12/03/2016     Procedure: CARDIOVERSION;  Surgeon: Dorothy Spark, MD;  Location: [place];  Service: Cardiovascular;  Laterality: N/A;  . IMPLANTABLE CARDIOVERTER DEFIBRILLATOR (ICD) GENERATOR CHANGE N/A 03/19/2013   SJM Fortify ST DR generator placed by Dr Lovena Le, part of Analyze ST study  . LEFT AND RIGHT HEART CATHETERIZATION WITH CORONARY ANGIOGRAM N/A 01/14/2015   Procedure: LEFT AND RIGHT HEART CATHETERIZATION WITH CORONARY ANGIOGRAM;  Surgeon: Peter M Martinique, MD;  Location: [facility] CATH LAB;  Service: Cardiovascular;  Laterality: N/A;  . LEFT HEART CATHETERIZATION WITH CORONARY ANGIOGRAM N/A 02/17/2012   Procedure: LEFT HEART CATHETERIZATION WITH CORONARY ANGIOGRAM;  Surgeon: Jolaine Artist, MD;  Location: [facility] CATH LAB;  Service: Cardiovascular;  Laterality: N/A;  . MITRAL VALVE ANNULOPLASTY  2004   Archie Endo 02/17/2012  . TEE WITHOUT CARDIOVERSION N/A 09/29/2016   Procedure: TRANSESOPHAGEAL ECHOCARDIOGRAM (TEE);  Surgeon: Lelon Perla, MD;  Location: [facility] ENDOSCOPY;  Service: Cardiovascular;  Laterality: N/A;  . TEE WITHOUT CARDIOVERSION N/A 12/03/2016   Procedure: TRANSESOPHAGEAL ECHOCARDIOGRAM (TEE);  Surgeon: Dorothy Spark, MD;  Location: [facility] ENDOSCOPY;  Service: Cardiovascular;  Laterality: N/A;    ROS- all systems are reviewed and negative except as per HPI above  Current Outpatient Medications  Medication Sig Dispense Refill  . amiodarone (PACERONE) 200 MG tablet Take 1 tablet (200 mg total) by mouth daily. <PLEASE MAKE  APPOINTMENT FOR REFILLS> 90 tablet 0  . aspirin EC 81 MG tablet Take 81 mg by mouth daily.    Marland Kitchen atorvastatin (LIPITOR) 20 MG tablet Take 1 tablet (20 mg total) by mouth daily at 6 PM. 90 tablet 2  . carvedilol (COREG) 25 MG tablet Take 1 tablet (25 mg total) by mouth 2 (two) times daily with a meal. 180 tablet 2  . DULoxetine (CYMBALTA) 20 MG capsule Take 20 mg by mouth daily.    . DULoxetine (CYMBALTA) 30 MG capsule Take 30 mg by mouth daily.    . insulin aspart protamine-  aspart (NOVOLOG MIX 70/30) (70-30) 100 UNIT/ML injection Inject 35 Units into the skin 2 (two) times daily with a meal.    . nitroGLYCERIN (NITROSTAT) 0.4 MG SL tablet Place 1 tablet (0.4 mg total) under the tongue every 5 (five) minutes x 3 doses as needed for chest pain. 25 tablet 12  . omeprazole (PRILOSEC) 40 MG capsule Take 40 mg by mouth daily.    . sitaGLIPtin (JANUVIA) 100 MG tablet Take 100 mg by mouth daily.    . tamsulosin (FLOMAX) 0.4 MG CAPS capsule Take 1 capsule (0.4 mg total) by mouth daily. 30 capsule 0  . traZODone (DESYREL) 100 MG tablet Take 100 mg by mouth at bedtime as needed for sleep.    . valsartan (DIOVAN) 160 MG tablet Take 1 tablet (160 mg total) by mouth daily. 30 tablet 6  . warfarin (COUMADIN) 1 MG tablet Take 1 tablet (1 mg total) by mouth one time only at 6 PM. 30 tablet 0   No current facility-administered medications for this visit.     Physical Exam: Vitals:   04/24/18 1111  BP: 126/84  Pulse: 92  Weight: 237 lb (107.5 kg)  Height: 5\' 11"  (1.803 m)    GEN- The patient is well appearing, alert and oriented x 3 today.   Head- normocephalic, atraumatic Eyes-  Sclera clear, conjunctiva pink Ears- hearing intact Oropharynx- clear Lungs- Clear to ausculation bilaterally, normal work of breathing Chest- ICD pocket is well healed Heart- Regular rate and rhythm  GI- soft, NT, ND, + BS Extremities- no clubbing, cyanosis, + edema  ICD interrogation- reviewed in detail today,  See PACEART report  ekg tracing ordered today is personally reviewed and shows sinus rhythm, QRS 118 msec  Wt Readings from Last 3 Encounters:  04/24/18 237 lb (107.5 kg)  12/28/16 244 lb (110.7 kg)  12/10/16 238 lb (108 kg)    Assessment and Plan:  1.  Chronic combined diastolic and systolic dysfunction/ ischemic CM/ CAD euvolemic today No ischemic symptoms Stable on an appropriate medical regimen Normal ICD function See [facility] Art report No changes today Battery  integrity alert installed today, vibratory alert demonstrated, and battery advisory discussed with the patient again today. Bmet, bnp today  2. HTN Stable No change required today  3. Persistent afib Burden by device interrogation today is <1% In sinus rhythm today continue Amiodarone Check lfts, tfts and cbc today Rate controlled chads2vasc score is at least 5.  On coumadin  [place] Follow-up with PA in a year Follow-up with Dr Martinique is overdue  Thompson Grayer MD, [facility] 04/24/2018 11:27 AM

## 2018-04-25 ENCOUNTER — Telehealth: Payer: Self-pay

## 2018-04-25 DIAGNOSIS — E119 Type 2 diabetes mellitus without complications: Secondary | ICD-10-CM | POA: Diagnosis not present

## 2018-04-25 DIAGNOSIS — Z6833 Body mass index (BMI) 33.0-33.9, adult: Secondary | ICD-10-CM | POA: Diagnosis not present

## 2018-04-25 DIAGNOSIS — Z7901 Long term (current) use of anticoagulants: Secondary | ICD-10-CM | POA: Diagnosis not present

## 2018-04-25 DIAGNOSIS — Z9114 Patient's other noncompliance with medication regimen: Secondary | ICD-10-CM | POA: Diagnosis not present

## 2018-04-25 LAB — COMPREHENSIVE METABOLIC PANEL
ALK PHOS: 94 IU/L (ref 39–117)
ALT: 26 IU/L (ref 0–44)
AST: 21 IU/L (ref 0–40)
Albumin/Globulin Ratio: 1.9 (ref 1.2–2.2)
Albumin: 4.5 g/dL (ref 3.6–4.8)
BILIRUBIN TOTAL: 0.7 mg/dL (ref 0.0–1.2)
BUN/Creatinine Ratio: 10 (ref 10–24)
BUN: 15 mg/dL (ref 8–27)
CHLORIDE: 93 mmol/L — AB (ref 96–106)
CO2: 22 mmol/L (ref 20–29)
Calcium: 9.7 mg/dL (ref 8.6–10.2)
Creatinine, Ser: 1.5 mg/dL — ABNORMAL HIGH (ref 0.76–1.27)
GFR calc Af Amer: 55 mL/min/{1.73_m2} — ABNORMAL LOW (ref >59)
GFR calc non Af Amer: 47 mL/min/{1.73_m2} — ABNORMAL LOW (ref >59)
GLUCOSE: 456 mg/dL — AB (ref 65–99)
Globulin, Total: 2.4 g/dL (ref 1.5–4.5)
Potassium: 4.4 mmol/L (ref 3.5–5.2)
Sodium: 132 mmol/L — ABNORMAL LOW (ref 134–144)
Total Protein: 6.9 g/dL (ref 6.0–8.5)

## 2018-04-25 LAB — CBC WITH DIFFERENTIAL/PLATELET
BASOS: 1 %
Basophils Absolute: 0.1 10*3/uL (ref 0.0–0.2)
EOS (ABSOLUTE): 0.2 10*3/uL (ref 0.0–0.4)
EOS: 3 %
HEMATOCRIT: 45.9 % (ref 37.5–51.0)
HEMOGLOBIN: 15.8 g/dL (ref 13.0–17.7)
Immature Grans (Abs): 0 10*3/uL (ref 0.0–0.1)
Immature Granulocytes: 0 %
LYMPHS ABS: 1.1 10*3/uL (ref 0.7–3.1)
Lymphs: 14 %
MCH: 30.5 pg (ref 26.6–33.0)
MCHC: 34.4 g/dL (ref 31.5–35.7)
MCV: 89 fL (ref 79–97)
MONOCYTES: 7 %
MONOS ABS: 0.6 10*3/uL (ref 0.1–0.9)
NEUTROS ABS: 6 10*3/uL (ref 1.4–7.0)
Neutrophils: 75 %
PLATELETS: 281 10*3/uL (ref 150–379)
RBC: 5.18 x10E6/uL (ref 4.14–5.80)
RDW: 14.2 % (ref 12.3–15.4)
WBC: 7.9 10*3/uL (ref 3.4–10.8)

## 2018-04-25 LAB — TSH: TSH: 4.05 u[IU]/mL (ref 0.450–4.500)

## 2018-04-25 LAB — BRAIN NATRIURETIC PEPTIDE: BNP: 75 pg/mL (ref 0.0–100.0)

## 2018-04-25 NOTE — Telephone Encounter (Signed)
Call placed to Pt.  Pt with glucose 456 yesterday at office visit. Per Pt he no longer takes Januvia or Novolog 70/30. Per Pt he takes Lantus 50 units BID-but states this is not working. Pt has appt with PCP today and will get note sent to office. Will cont to follow.

## 2018-04-27 DIAGNOSIS — R791 Abnormal coagulation profile: Secondary | ICD-10-CM | POA: Diagnosis not present

## 2018-05-04 DIAGNOSIS — R791 Abnormal coagulation profile: Secondary | ICD-10-CM | POA: Diagnosis not present

## 2018-05-05 DIAGNOSIS — R791 Abnormal coagulation profile: Secondary | ICD-10-CM | POA: Diagnosis not present

## 2018-05-09 DIAGNOSIS — R791 Abnormal coagulation profile: Secondary | ICD-10-CM | POA: Diagnosis not present

## 2018-05-10 LAB — CUP PACEART REMOTE DEVICE CHECK
Battery Remaining Longevity: 49 mo
Battery Remaining Percentage: 45 %
Battery Voltage: 2.92 V
Brady Statistic AP VP Percent: 1 %
Brady Statistic AS VP Percent: 1 %
Brady Statistic RA Percent Paced: 1 %
Date Time Interrogation Session: 20190508090429
HIGH POWER IMPEDANCE MEASURED VALUE: 52 Ohm
Implantable Lead Implant Date: 20070516
Implantable Lead Implant Date: 20070516
Implantable Lead Location: 753860
Lead Channel Impedance Value: 380 Ohm
Lead Channel Pacing Threshold Amplitude: 0.5 V
Lead Channel Pacing Threshold Amplitude: 1.25 V
Lead Channel Pacing Threshold Pulse Width: 0.5 ms
Lead Channel Pacing Threshold Pulse Width: 0.5 ms
Lead Channel Sensing Intrinsic Amplitude: 2.5 mV
Lead Channel Setting Pacing Pulse Width: 0.5 ms
Lead Channel Setting Sensing Sensitivity: 0.5 mV
MDC IDC LEAD LOCATION: 753859
MDC IDC MSMT LEADCHNL RA IMPEDANCE VALUE: 430 Ohm
MDC IDC MSMT LEADCHNL RV SENSING INTR AMPL: 11.9 mV
MDC IDC PG IMPLANT DT: 20140407
MDC IDC SET LEADCHNL RA PACING AMPLITUDE: 2 V
MDC IDC SET LEADCHNL RV PACING AMPLITUDE: 2.5 V
MDC IDC STAT BRADY AP VS PERCENT: 1 %
MDC IDC STAT BRADY AS VS PERCENT: 99 %
MDC IDC STAT BRADY RV PERCENT PACED: 1 %
Pulse Gen Serial Number: 1056971

## 2018-05-12 DIAGNOSIS — R791 Abnormal coagulation profile: Secondary | ICD-10-CM | POA: Diagnosis not present

## 2018-05-31 DIAGNOSIS — Z1339 Encounter for screening examination for other mental health and behavioral disorders: Secondary | ICD-10-CM | POA: Diagnosis not present

## 2018-05-31 DIAGNOSIS — R42 Dizziness and giddiness: Secondary | ICD-10-CM | POA: Diagnosis not present

## 2018-05-31 DIAGNOSIS — Z7901 Long term (current) use of anticoagulants: Secondary | ICD-10-CM | POA: Diagnosis not present

## 2018-05-31 DIAGNOSIS — R791 Abnormal coagulation profile: Secondary | ICD-10-CM | POA: Diagnosis not present

## 2018-05-31 DIAGNOSIS — E119 Type 2 diabetes mellitus without complications: Secondary | ICD-10-CM | POA: Diagnosis not present

## 2018-05-31 DIAGNOSIS — Z79899 Other long term (current) drug therapy: Secondary | ICD-10-CM | POA: Diagnosis not present

## 2018-06-01 DIAGNOSIS — Z794 Long term (current) use of insulin: Secondary | ICD-10-CM | POA: Diagnosis not present

## 2018-06-01 DIAGNOSIS — E1165 Type 2 diabetes mellitus with hyperglycemia: Secondary | ICD-10-CM | POA: Diagnosis not present

## 2018-06-06 DIAGNOSIS — R791 Abnormal coagulation profile: Secondary | ICD-10-CM | POA: Diagnosis not present

## 2018-06-13 DIAGNOSIS — R791 Abnormal coagulation profile: Secondary | ICD-10-CM | POA: Diagnosis not present

## 2018-06-20 DIAGNOSIS — R791 Abnormal coagulation profile: Secondary | ICD-10-CM | POA: Diagnosis not present

## 2018-06-27 DIAGNOSIS — R791 Abnormal coagulation profile: Secondary | ICD-10-CM | POA: Diagnosis not present

## 2018-06-29 DIAGNOSIS — R791 Abnormal coagulation profile: Secondary | ICD-10-CM | POA: Diagnosis not present

## 2018-06-30 DIAGNOSIS — Z79899 Other long term (current) drug therapy: Secondary | ICD-10-CM | POA: Diagnosis not present

## 2018-06-30 DIAGNOSIS — E119 Type 2 diabetes mellitus without complications: Secondary | ICD-10-CM | POA: Diagnosis not present

## 2018-07-19 ENCOUNTER — Ambulatory Visit (INDEPENDENT_AMBULATORY_CARE_PROVIDER_SITE_OTHER): Payer: Medicare HMO | Admitting: *Deleted

## 2018-07-19 DIAGNOSIS — I480 Paroxysmal atrial fibrillation: Secondary | ICD-10-CM

## 2018-07-19 DIAGNOSIS — I255 Ischemic cardiomyopathy: Secondary | ICD-10-CM

## 2018-07-20 DIAGNOSIS — R791 Abnormal coagulation profile: Secondary | ICD-10-CM | POA: Diagnosis not present

## 2018-07-20 NOTE — Progress Notes (Signed)
Remote ICD transmission.   

## 2018-07-31 DIAGNOSIS — R5383 Other fatigue: Secondary | ICD-10-CM | POA: Diagnosis not present

## 2018-07-31 DIAGNOSIS — R791 Abnormal coagulation profile: Secondary | ICD-10-CM | POA: Diagnosis not present

## 2018-07-31 DIAGNOSIS — I861 Scrotal varices: Secondary | ICD-10-CM | POA: Diagnosis not present

## 2018-07-31 DIAGNOSIS — M79604 Pain in right leg: Secondary | ICD-10-CM | POA: Diagnosis not present

## 2018-08-02 DIAGNOSIS — I861 Scrotal varices: Secondary | ICD-10-CM | POA: Diagnosis not present

## 2018-08-02 DIAGNOSIS — N401 Enlarged prostate with lower urinary tract symptoms: Secondary | ICD-10-CM | POA: Diagnosis not present

## 2018-08-02 DIAGNOSIS — N509 Disorder of male genital organs, unspecified: Secondary | ICD-10-CM | POA: Diagnosis not present

## 2018-08-02 DIAGNOSIS — Z79899 Other long term (current) drug therapy: Secondary | ICD-10-CM | POA: Diagnosis not present

## 2018-08-02 DIAGNOSIS — R0989 Other specified symptoms and signs involving the circulatory and respiratory systems: Secondary | ICD-10-CM | POA: Diagnosis not present

## 2018-08-02 DIAGNOSIS — N451 Epididymitis: Secondary | ICD-10-CM | POA: Diagnosis not present

## 2018-08-16 DIAGNOSIS — I4891 Unspecified atrial fibrillation: Secondary | ICD-10-CM | POA: Diagnosis not present

## 2018-08-16 DIAGNOSIS — E119 Type 2 diabetes mellitus without complications: Secondary | ICD-10-CM | POA: Diagnosis not present

## 2018-08-16 DIAGNOSIS — E785 Hyperlipidemia, unspecified: Secondary | ICD-10-CM | POA: Diagnosis not present

## 2018-08-16 DIAGNOSIS — Z79899 Other long term (current) drug therapy: Secondary | ICD-10-CM | POA: Diagnosis not present

## 2018-08-16 DIAGNOSIS — Z7901 Long term (current) use of anticoagulants: Secondary | ICD-10-CM | POA: Diagnosis not present

## 2018-08-17 LAB — CUP PACEART REMOTE DEVICE CHECK
Battery Remaining Longevity: 47 mo
Battery Remaining Percentage: 43 %
Brady Statistic AP VS Percent: 1.1 %
Brady Statistic AS VS Percent: 98 %
Brady Statistic RA Percent Paced: 1 %
Brady Statistic RV Percent Paced: 1 %
HIGH POWER IMPEDANCE MEASURED VALUE: 47 Ohm
Implantable Lead Implant Date: 20070516
Implantable Lead Implant Date: 20070516
Implantable Lead Location: 753859
Implantable Lead Model: 7001
Implantable Pulse Generator Implant Date: 20140407
Lead Channel Impedance Value: 360 Ohm
Lead Channel Pacing Threshold Amplitude: 0.5 V
Lead Channel Pacing Threshold Pulse Width: 0.5 ms
Lead Channel Sensing Intrinsic Amplitude: 11.9 mV
Lead Channel Sensing Intrinsic Amplitude: 2.3 mV
Lead Channel Setting Pacing Amplitude: 2 V
Lead Channel Setting Pacing Amplitude: 2.5 V
Lead Channel Setting Sensing Sensitivity: 0.5 mV
MDC IDC LEAD LOCATION: 753860
MDC IDC MSMT BATTERY VOLTAGE: 2.92 V
MDC IDC MSMT LEADCHNL RA IMPEDANCE VALUE: 400 Ohm
MDC IDC MSMT LEADCHNL RA PACING THRESHOLD PULSEWIDTH: 0.5 ms
MDC IDC MSMT LEADCHNL RV PACING THRESHOLD AMPLITUDE: 1.25 V
MDC IDC PG SERIAL: 1056971
MDC IDC SESS DTM: 20190807080358
MDC IDC SET LEADCHNL RV PACING PULSEWIDTH: 0.5 ms
MDC IDC STAT BRADY AP VP PERCENT: 1 %
MDC IDC STAT BRADY AS VP PERCENT: 1 %

## 2018-08-19 NOTE — Progress Notes (Deleted)
[name] [name] Date of Birth: 26-Sep-1951   History of Present Illness: [name] [name] is seen for followup of his coronary disease. Last seen by me in May 2016.  He has a history of CAD. He underwent stenting of the LAD  in 2000 with BMS and PTCA of the first diagonal at [facility]. Cardiac caths in 2005 Dorthula Rue MD) and 2013 ([place]) showed no obstructive disease. He is s/p MV repair at [facility] in 2004. He has a history of chronic systolic CHF and has an ICD in place. He is followed in the device clinic. His last myoview in 2016 showed ischemia and low EF. Cardiac cath was done showing severe stenosis in the diagonal and OM1. These lesions were stented with DES. The LAD stent had diffuse 40-50% disease. TEE in 2017 showed persistent severe LV dysfunction.  He has a history of atrial flutter and is s/p DCCV in October and December 2017. He is on amiodarone. On Coumadin. Followed by Dr. Rayann Heman.   Today he reports SOB at times. It tends to awaken him at night. He has some chest tightness with this. He denies edema or palpitations. Stays fairly active. Labs are followed by primary care.    Current Outpatient Medications on File Prior to Visit  Medication Sig Dispense Refill  . amiodarone (PACERONE) 200 MG tablet Take 1 tablet (200 mg total) by mouth daily. <PLEASE MAKE APPOINTMENT FOR REFILLS> 90 tablet 0  . aspirin EC 81 MG tablet Take 81 mg by mouth daily.    Marland Kitchen atorvastatin (LIPITOR) 20 MG tablet Take 1 tablet (20 mg total) by mouth daily at 6 PM. 90 tablet 2  . carvedilol (COREG) 25 MG tablet Take 1 tablet (25 mg total) by mouth 2 (two) times daily with a meal. 180 tablet 2  . DULoxetine (CYMBALTA) 20 MG capsule Take 20 mg by mouth daily.    . DULoxetine (CYMBALTA) 30 MG capsule Take 30 mg by mouth daily.    . insulin glargine (LANTUS) 100 UNIT/ML injection Inject 50 Units into the skin at bedtime.    . nitroGLYCERIN (NITROSTAT) 0.4 MG SL tablet Place 1 tablet (0.4 mg total)  under the tongue every 5 (five) minutes x 3 doses as needed for chest pain. 25 tablet 12  . omeprazole (PRILOSEC) 40 MG capsule Take 40 mg by mouth daily.    . tamsulosin (FLOMAX) 0.4 MG CAPS capsule Take 1 capsule (0.4 mg total) by mouth daily. 30 capsule 0  . traZODone (DESYREL) 100 MG tablet Take 100 mg by mouth at bedtime as needed for sleep.    . valsartan (DIOVAN) 160 MG tablet Take 1 tablet (160 mg total) by mouth daily. 30 tablet 6  . warfarin (COUMADIN) 1 MG tablet Take 1 tablet (1 mg total) by mouth one time only at 6 PM. 30 tablet 0   No current facility-administered medications on file prior to visit.     Allergies  Allergen Reactions  . Metformin And Related Nausea Only and Other (See Comments)    Loss of appetite  . Lisinopril Cough    Past Medical History:  Diagnosis Date  . Anxiety   . Arthritis   . CAD (coronary artery disease)    a.  1993 s/p MI - [employer];  b. s/p BMS to LAD '00;  c. PTCA 2nd diagonal 2010;  d. 02/18/12 Cath: moderate nonobs dzs - med rx;  e.  01/2015 Cath: LM nl, LAD 40-42mISR, 70-867m, d1 90p (  3.0x16 Synergy DES), D2 50-60, LCX nl, OM1 50p, 56m(2.5x12 Synergy DES), RCA nl, EF 30-35%.  . Chronic combined systolic and diastolic CHF (congestive heart failure) ([place]    a. 12/2014 Echo: EF 30-35%, Gr2 DD, mod MR, sev dil LA.  . CKD (chronic kidney disease), stage III ([place]   . Depression   . ED (erectile dysfunction)   . GERD (gastroesophageal reflux disease)   . Hyperlipidemia   . Hypertension   . Ischemic cardiomyopathy    a. s/p St. [name] (Atlas) [place];  b. 12/2014 Echo: Ef 30-35%.  . MVP (mitral valve prolapse)    [facility]  .Marland KitchenPersistent atrial fibrillation ([place]    a. noted on ICD interrogation '10 - [place] CHA2DS2VASc = 5.  . Type II diabetes mellitus ([place]    uncontrolled    Past Surgical History:  Procedure Laterality Date  . CARDIAC DEFIBRILLATOR  PLACEMENT  2007   [place] 7001 RV lead and a SJM Atlas ICD followed by Dr KCaryl Comes . CARDIOVERSION N/A 09/29/2016   Procedure: CARDIOVERSION;  Surgeon: BLelon Perla MD;  [facility];  Service: Cardiovascular;  Laterality: N/A;  . CARDIOVERSION N/A 12/03/2016   Procedure: CARDIOVERSION;  Surgeon: KDorothy Spark MD;  [place]  Service: Cardiovascular;  Laterality: N/A;  . IMPLANTABLE CARDIOVERTER DEFIBRILLATOR (ICD) GENERATOR CHANGE N/A 03/19/2013   SJM Fortify ST DR generator placed by Dr TLovena Le part of Analyze ST study  . LEFT AND RIGHT HEART CATHETERIZATION WITH CORONARY ANGIOGRAM N/A 01/14/2015   Procedure: LEFT AND RIGHT HEART CATHETERIZATION WITH CORONARY ANGIOGRAM;  Surgeon: Peter M JMartinique MD;  [facility];  Service: Cardiovascular;  Laterality: N/A;  . LEFT HEART CATHETERIZATION WITH CORONARY ANGIOGRAM N/A 02/17/2012   Procedure: LEFT HEART CATHETERIZATION WITH CORONARY ANGIOGRAM;  Surgeon: DJolaine Artist MD;  [facility];  Service: Cardiovascular;  Laterality: N/A;  . MITRAL VALVE ANNULOPLASTY  2004   /Archie Endo3/06/2012  . TEE WITHOUT CARDIOVERSION N/A 09/29/2016   Procedure: TRANSESOPHAGEAL ECHOCARDIOGRAM (TEE);  Surgeon: BLelon Perla MD;  [facility];  Service: Cardiovascular;  Laterality: N/A;  . TEE WITHOUT CARDIOVERSION N/A 12/03/2016   Procedure: TRANSESOPHAGEAL ECHOCARDIOGRAM (TEE);  Surgeon: KDorothy Spark MD;  [facility];  Service: Cardiovascular;  Laterality: N/A;    Social History   Tobacco Use  Smoking Status Never Smoker  Smokeless Tobacco Never Used    Social History   Substance and Sexual Activity  Alcohol Use Yes  . Alcohol/week: 0.0 standard drinks   Comment: 01/14/2015 "used to drink alot; stopped completely in 1983"    Family History  Problem Relation Age of Onset  . Coronary artery disease Mother   . Colon cancer Mother   . Heart attack Mother   .  Heart disease Mother   . Heart failure Mother   . Hypertension Mother   . Malignant hyperthermia Mother   . Colon cancer Sister   . Coronary artery disease Brother   . Heart disease Brother     Review of Systems: As noted in history of present illness. All other systems are reviewed and are negative.  Physical Exam: There were no vitals taken for this visit. He is a pleasant overweight white male in no acute distress. He is normocephalic, atraumatic. Pupils are equal round and reactive to light and accommodation. Extraocular movements are full. Oropharynx is  clear. Neck is supple without JVD, adenopathy, thyromegaly, or bruits. Lungs are clear. Cardiac exam reveals a regular rate and rhythm with a grade 6-9/6 holosystolic murmur heard best at the apex radiating to the left axilla. There is no S3. His defibrillator site in the left subclavicular area appears normal. He has a healed median sternotomy scar.  Femoral and pedal pulses are 2+ and symmetric. He has no significant edema. Skin is warm and dry. He is alert and oriented x3. Cranial nerves II through XII are intact.  LABORATORY DATA: Lab Results  Component Value Date   WBC 7.9 04/24/2018   HGB 15.8 04/24/2018   HCT 45.9 04/24/2018   PLT 281 04/24/2018   GLUCOSE 456 (H) 04/24/2018   CHOL 116 09/30/2016   TRIG 162 (H) 09/30/2016   HDL 26 (L) 09/30/2016   LDLCALC 58 09/30/2016   ALT 26 04/24/2018   AST 21 04/24/2018   NA 132 (L) 04/24/2018   K 4.4 04/24/2018   CL 93 (L) 04/24/2018   CREATININE 1.50 (H) 04/24/2018   BUN 15 04/24/2018   CO2 22 04/24/2018   TSH 4.050 04/24/2018   INR 2.75 12/03/2016   HGBA1C 12.8 (H) 09/27/2016     Myoview 01/10/15: Overall Impression:  High risk stress nuclear study with moderate-sized and intensity, reversible inferior perfusion defect - there is adjacent bowel which could lead to some attenuation, however, there is severe inferior hypokinesis.  LV Wall Motion:  LVEF 33%, global  hypokinesis with severe inferior hypokinesis to akinesis    Echo 01/08/15: Study Conclusions  - Left ventricle: The cavity size was normal. Wall thickness was normal. Systolic function was moderately to severely reduced. The estimated ejection fraction was in the range of 30% to 35%. Features are consistent with a pseudonormal left ventricular filling pattern, with concomitant abnormal relaxation and increased filling pressure (grade 2 diastolic dysfunction). - Ventricular septum: Septal motion showed abnormal function, dyssynergy, and paradox. - Mitral valve: Prior procedures included surgical repair. An annular ring prosthesis was present. There was moderate regurgitation directed eccentrically and anteriorly. Valve area by pressure half-time: 2.29 cm^2. - Left atrium: The atrium was severely dilated.  Cardiac cath 01/14/15: Cardiac Catheterization Procedure Note  Name: [name] [name] MRN: [id number] DOB: 01-16-1951  Procedure: Right Heart Cath, Left Heart Cath, Selective Coronary Angiography, LV angiography, PCI and stenting of the first diagonal, PCI and stenting of the first OM  Indication: 67 yo WM with CAD presents with worsening CHF and declining LV function. Myoview is high risk.    Procedural Details: The right wrist was prepped, draped, and anesthetized with 1% lidocaine. Using the modified Seldinger technique a 6 Fr slender sheath was placed in the right radial artery and a 5 French sheath was placed in the right brachial vein. A Swan-Ganz catheter was used for the right heart catheterization. Standard protocol was followed for recording of right heart pressures and sampling of oxygen saturations. Fick cardiac output was calculated. Standard Judkins catheters were used for selective coronary angiography and left ventriculography. There were no immediate procedural complications. The patient was transferred to the post catheterization recovery area for  further monitoring.  Procedural Findings: Hemodynamics RA 5/5/ mean 4 mm Hg RV 29/5 mm Hg PA 27/4 mean 15 mm Hg PCWP 12/16 mean 11 mm Hg LV 152/13 mm Hg AO 152/84 mean of 110 mm Hg  Oxygen saturations: PA 65% AO 97%  Cardiac Output (Fick) 4.47 L/min  Cardiac Index (Fick) 2.0 L/min/meter squared.  Coronary angiography: Coronary dominance: right  Left mainstem: Normal  Left anterior descending (LAD): The LAD is a large vessel. It is diffusely diseased in the mid to distal vessel. There is a stent in the mid vessel just past the takeoff of the first and second diagonal. There is 40-50% in stent disease in the proximal stent. There is diffuse 70-80% disease in the mid to distal vessel. The first diagonal is a large branch with 90% proximal stenosis. The second diagonal is small with 50-60% mid vessel disease.   Left circumflex (LCx): The LCx gives rise to a single OM branch. There is 50% disease in the proximal OM. The mid OM has a focal 80% lesion.  Right coronary artery (RCA): The RCA is a very large dominant vessel. It has no significant disease.   Left ventriculography: Left ventricular systolic function is abnormal, there is global hypokinesis. LVEF is estimated at 30-35%, there is mild mitral regurgitation   PCI procedure: We proceeded at this point with PCI of the first diagonal and first OM. The patient was given Effient 60 mg po. He was anticoagulated with IV bivalirudin. After therapeutic ACT the LCA was accessed with a 6Fr XBLAD 3.5 guide. The first diagonal lesion was crossed with a prowater wire and predilated with a 2.5 mm balloon. Crossing was difficult due to severe tortuosity of the proximal diagonal. A Guideliner support catheter was placed proximal to the diagonal and then we were able to deliver a 3.0 x 16 mm Synergy stent. This was dilated to 14 atm with the stent balloon. Following PCI, there was 0% residual stenosis and TIMI 3 flow. Angiography  confirmed an excellent result.  We next addressed the lesion in the mid OM1. This lesion was crossed with the prowater wire and predilated with a 2.25 mm balloon. The lesion was then stented with a 2.5 x 12 mm Synergy stent. This was inflated to 14 atm with the stent balloon. Following PCI, there was 0% residual stenosis and TIMI 3 flow. Angiography confirmed and excellent result. The patient tolerated the procedure well without complications. He was transported to the recovery area for further monitoring.   Vessel #1: First diagonal Percent stenosis (pre): 90% TIMI flow (pre): 3 Stent: 3.0 x 16 mm Synergy Percent stenosis (post): 0% TIMI flow (post ) 3  Vessel #2: first OM Percent stenosis (pre) 80% TIMI flow (pre) 3 Stent 2.5 x 12 mm Synergy Percent stenosis (post) 0% TIMI flow (post) 3   Final Conclusions:   1. Severe 2 vessel obstructive CAD 2. Severe LV dysfunction. 3. Normal right heart pressures. 4. Successful stenting of the first diagonal with a DES 5. Successful stenting of the first OM with a DES.  Recommendations: Continue DAPT for one year. If patient unable to afford Effient would need to load with Plavix. Continue medical therapy for LV dysfunction. Mitral insufficiency does not appear to be hemodynamically significant. I would treat his LAD disease medically unless he has refractory angina. It is fairly diffuse.    Peter Martinique, [place] 01/14/2015, 1:11 PM   TEE 12/03/16: Study Conclusions  - Left ventricle: Systolic function was severely reduced. The   estimated ejection fraction was in the range of 25% to 30%.   Diffuse hypokinesis. - Aortic valve: Structurally normal valve. Trileaflet; normal   thickness leaflets. There was no significant regurgitation. - Aorta: There was moderate non-mobile atheroma. - Ascending aorta: The ascending aorta was mildly dilated measuring   40 mm. - Descending aorta: The descending  aorta was normal in size. - Mitral  valve: There was mild to moderate regurgitation. No   paravalvular leak. - Left atrium: The atrium was dilated. No evidence of thrombus in   the atrial cavity or appendage. No evidence of thrombus in the   atrial cavity or appendage. No evidence of thrombus in the   appendage. - Right ventricle: Systolic function was mildly reduced. - Right atrium: No evidence of thrombus in the atrial cavity or   appendage. - Atrial septum: No defect or patent foramen ovale was identified. - Tricuspid valve: There was mild regurgitation. - Pericardium, extracardiac: There was no pericardial effusion.   Assessment / Plan: 1. Coronary disease status post stenting of the LAD and diagonal. Cardiac catheterization in March of 2013 showed continued patency of the stent with moderate nonobstructive disease. Will update stress Myoview at this time. Continue current medication.  2. History of congestive heart failure with chronic systolic dysfunction. Ejection fraction had improved to 50-55% by echo in 2012. He is status post ICD implant. Will update Echo.  3. History of paroxysmal atrial fibrillation by ICD check in 2010. No evidence of recurrence.  4. Hypertension. Blood pressure is well controlled.   6. Diabetes mellitus on insulin.  7. History of mitral valve prolapse status post mitral annuloplasty in 2004. Follow up Echo.

## 2018-08-23 ENCOUNTER — Ambulatory Visit: Payer: Medicare HMO | Admitting: Cardiology

## 2018-09-11 DIAGNOSIS — R791 Abnormal coagulation profile: Secondary | ICD-10-CM | POA: Diagnosis not present

## 2018-09-22 DIAGNOSIS — R079 Chest pain, unspecified: Secondary | ICD-10-CM | POA: Diagnosis not present

## 2018-09-22 DIAGNOSIS — Z7901 Long term (current) use of anticoagulants: Secondary | ICD-10-CM

## 2018-09-22 DIAGNOSIS — I4891 Unspecified atrial fibrillation: Secondary | ICD-10-CM | POA: Diagnosis not present

## 2018-09-22 DIAGNOSIS — N189 Chronic kidney disease, unspecified: Secondary | ICD-10-CM

## 2018-09-22 DIAGNOSIS — J189 Pneumonia, unspecified organism: Secondary | ICD-10-CM | POA: Insufficient documentation

## 2018-09-22 DIAGNOSIS — I509 Heart failure, unspecified: Secondary | ICD-10-CM | POA: Diagnosis not present

## 2018-09-22 DIAGNOSIS — Z7982 Long term (current) use of aspirin: Secondary | ICD-10-CM | POA: Diagnosis not present

## 2018-09-22 DIAGNOSIS — E119 Type 2 diabetes mellitus without complications: Secondary | ICD-10-CM | POA: Diagnosis not present

## 2018-09-22 DIAGNOSIS — R05 Cough: Secondary | ICD-10-CM | POA: Diagnosis not present

## 2018-09-22 DIAGNOSIS — R509 Fever, unspecified: Secondary | ICD-10-CM | POA: Diagnosis not present

## 2018-09-22 DIAGNOSIS — R778 Other specified abnormalities of plasma proteins: Secondary | ICD-10-CM

## 2018-09-22 DIAGNOSIS — Z794 Long term (current) use of insulin: Secondary | ICD-10-CM | POA: Diagnosis not present

## 2018-09-22 DIAGNOSIS — I2510 Atherosclerotic heart disease of native coronary artery without angina pectoris: Secondary | ICD-10-CM | POA: Diagnosis not present

## 2018-09-22 DIAGNOSIS — Z79899 Other long term (current) drug therapy: Secondary | ICD-10-CM | POA: Diagnosis not present

## 2018-09-22 DIAGNOSIS — R7989 Other specified abnormal findings of blood chemistry: Secondary | ICD-10-CM | POA: Diagnosis not present

## 2018-09-22 HISTORY — DX: Pneumonia, unspecified organism: J18.9

## 2018-09-22 HISTORY — DX: Other specified abnormalities of plasma proteins: R77.8

## 2018-09-22 HISTORY — DX: Chronic kidney disease, unspecified: N18.9

## 2018-09-22 HISTORY — DX: Long term (current) use of anticoagulants: Z79.01

## 2018-09-23 DIAGNOSIS — J189 Pneumonia, unspecified organism: Secondary | ICD-10-CM | POA: Diagnosis not present

## 2018-09-23 DIAGNOSIS — I4891 Unspecified atrial fibrillation: Secondary | ICD-10-CM | POA: Diagnosis not present

## 2018-09-23 DIAGNOSIS — I2510 Atherosclerotic heart disease of native coronary artery without angina pectoris: Secondary | ICD-10-CM | POA: Diagnosis not present

## 2018-09-23 DIAGNOSIS — R7989 Other specified abnormal findings of blood chemistry: Secondary | ICD-10-CM | POA: Diagnosis not present

## 2018-09-25 ENCOUNTER — Telehealth: Payer: Self-pay | Admitting: Cardiology

## 2018-09-25 NOTE — Telephone Encounter (Signed)
Spoke w/ pt and requested that he send a manual transmission b/c his home monitor has not updated in at least 7 days.   

## 2018-09-27 DIAGNOSIS — I4811 Longstanding persistent atrial fibrillation: Secondary | ICD-10-CM | POA: Diagnosis not present

## 2018-09-27 DIAGNOSIS — E1165 Type 2 diabetes mellitus with hyperglycemia: Secondary | ICD-10-CM | POA: Diagnosis not present

## 2018-09-27 DIAGNOSIS — N400 Enlarged prostate without lower urinary tract symptoms: Secondary | ICD-10-CM | POA: Diagnosis not present

## 2018-09-27 DIAGNOSIS — Z7901 Long term (current) use of anticoagulants: Secondary | ICD-10-CM | POA: Diagnosis not present

## 2018-09-27 DIAGNOSIS — J181 Lobar pneumonia, unspecified organism: Secondary | ICD-10-CM | POA: Diagnosis not present

## 2018-09-27 DIAGNOSIS — I2510 Atherosclerotic heart disease of native coronary artery without angina pectoris: Secondary | ICD-10-CM | POA: Diagnosis not present

## 2018-09-27 DIAGNOSIS — Z5181 Encounter for therapeutic drug level monitoring: Secondary | ICD-10-CM | POA: Diagnosis not present

## 2018-09-27 DIAGNOSIS — I43 Cardiomyopathy in diseases classified elsewhere: Secondary | ICD-10-CM | POA: Diagnosis not present

## 2018-09-30 DIAGNOSIS — R079 Chest pain, unspecified: Secondary | ICD-10-CM | POA: Diagnosis not present

## 2018-10-05 DIAGNOSIS — Z8701 Personal history of pneumonia (recurrent): Secondary | ICD-10-CM | POA: Diagnosis not present

## 2018-10-05 DIAGNOSIS — Z5181 Encounter for therapeutic drug level monitoring: Secondary | ICD-10-CM | POA: Diagnosis not present

## 2018-10-05 DIAGNOSIS — I2510 Atherosclerotic heart disease of native coronary artery without angina pectoris: Secondary | ICD-10-CM | POA: Diagnosis not present

## 2018-10-05 DIAGNOSIS — Z7901 Long term (current) use of anticoagulants: Secondary | ICD-10-CM | POA: Diagnosis not present

## 2018-10-06 ENCOUNTER — Telehealth: Payer: Self-pay

## 2018-10-06 NOTE — Telephone Encounter (Signed)
LMOVM requesting that pt send manual transmission b/c home monitor has not updated in at least 7 days.    

## 2018-10-09 DIAGNOSIS — Z7901 Long term (current) use of anticoagulants: Secondary | ICD-10-CM | POA: Diagnosis not present

## 2018-10-09 DIAGNOSIS — E1165 Type 2 diabetes mellitus with hyperglycemia: Secondary | ICD-10-CM | POA: Diagnosis not present

## 2018-10-09 DIAGNOSIS — I4811 Longstanding persistent atrial fibrillation: Secondary | ICD-10-CM | POA: Diagnosis not present

## 2018-10-09 DIAGNOSIS — Z5181 Encounter for therapeutic drug level monitoring: Secondary | ICD-10-CM | POA: Diagnosis not present

## 2018-10-09 DIAGNOSIS — I43 Cardiomyopathy in diseases classified elsewhere: Secondary | ICD-10-CM | POA: Diagnosis not present

## 2018-10-12 DIAGNOSIS — Z7901 Long term (current) use of anticoagulants: Secondary | ICD-10-CM | POA: Diagnosis not present

## 2018-10-12 DIAGNOSIS — Z5181 Encounter for therapeutic drug level monitoring: Secondary | ICD-10-CM | POA: Diagnosis not present

## 2018-10-12 DIAGNOSIS — E1165 Type 2 diabetes mellitus with hyperglycemia: Secondary | ICD-10-CM | POA: Diagnosis not present

## 2018-10-13 ENCOUNTER — Telehealth: Payer: Self-pay | Admitting: Cardiology

## 2018-10-13 NOTE — Telephone Encounter (Signed)
LMOVM requesting that pt send manual transmission b/c home monitor has not updated in at least 7 days.    

## 2018-10-18 ENCOUNTER — Telehealth: Payer: Self-pay

## 2018-10-18 ENCOUNTER — Encounter: Payer: Self-pay | Admitting: *Deleted

## 2018-10-18 NOTE — Telephone Encounter (Signed)
LMOVM reminding pt to send remote transmission.   

## 2018-10-23 ENCOUNTER — Telehealth: Payer: Self-pay | Admitting: Cardiology

## 2018-10-23 ENCOUNTER — Encounter: Payer: Self-pay | Admitting: Cardiology

## 2018-10-23 NOTE — Telephone Encounter (Signed)
LMOVM requesting that pt send manual transmission b/c home monitor has not updated in at least 14 days.    

## 2018-11-01 ENCOUNTER — Telehealth: Payer: Self-pay

## 2018-11-01 NOTE — Telephone Encounter (Signed)
LMOVM requesting that pt send manual transmission b/c home monitor has not updated in at least 7 days.    

## 2018-11-08 ENCOUNTER — Telehealth: Payer: Self-pay | Admitting: Cardiology

## 2018-11-08 NOTE — Telephone Encounter (Signed)
LMOVM requesting that pt send manual transmission b/c home monitor has not updated in at least 7 days.    

## 2018-11-21 ENCOUNTER — Telehealth: Payer: Self-pay | Admitting: Cardiology

## 2018-11-21 NOTE — Telephone Encounter (Signed)
LMOVM requesting that pt send manual transmission b/c home monitor has not updated in at least 7 days.    

## 2018-11-28 ENCOUNTER — Telehealth: Payer: Self-pay

## 2018-11-28 NOTE — Telephone Encounter (Signed)
LMOVM requesting that pt send manual transmission b/c home monitor has not updated in at least 7 days.    

## 2019-01-08 DIAGNOSIS — I340 Nonrheumatic mitral (valve) insufficiency: Secondary | ICD-10-CM

## 2019-01-08 DIAGNOSIS — E785 Hyperlipidemia, unspecified: Secondary | ICD-10-CM | POA: Insufficient documentation

## 2019-01-08 DIAGNOSIS — I259 Chronic ischemic heart disease, unspecified: Secondary | ICD-10-CM | POA: Insufficient documentation

## 2019-01-08 HISTORY — DX: Nonrheumatic mitral (valve) insufficiency: I34.0

## 2019-01-08 HISTORY — DX: Chronic ischemic heart disease, unspecified: I25.9

## 2019-04-17 DIAGNOSIS — U071 COVID-19: Secondary | ICD-10-CM | POA: Insufficient documentation

## 2019-04-17 HISTORY — DX: COVID-19: U07.1

## 2019-06-01 DIAGNOSIS — R0789 Other chest pain: Secondary | ICD-10-CM

## 2019-06-01 HISTORY — DX: Other chest pain: R07.89

## 2019-06-02 DIAGNOSIS — W57XXXA Bitten or stung by nonvenomous insect and other nonvenomous arthropods, initial encounter: Secondary | ICD-10-CM

## 2019-06-02 HISTORY — DX: Bitten or stung by nonvenomous insect and other nonvenomous arthropods, initial encounter: W57.XXXA

## 2020-01-04 ENCOUNTER — Emergency Department (HOSPITAL_COMMUNITY): Payer: Medicare HMO

## 2020-01-04 ENCOUNTER — Inpatient Hospital Stay (HOSPITAL_COMMUNITY)
Admission: EM | Admit: 2020-01-04 | Discharge: 2020-01-08 | DRG: 291 | Disposition: A | Discharge status: Home Health | Payer: Medicare HMO | Attending: Internal Medicine | Admitting: Internal Medicine

## 2020-01-04 DIAGNOSIS — I081 Rheumatic disorders of both mitral and tricuspid valves: Secondary | ICD-10-CM | POA: Diagnosis present

## 2020-01-04 DIAGNOSIS — Z95810 Presence of automatic (implantable) cardiac defibrillator: Secondary | ICD-10-CM | POA: Diagnosis not present

## 2020-01-04 DIAGNOSIS — I4819 Other persistent atrial fibrillation: Secondary | ICD-10-CM | POA: Diagnosis present

## 2020-01-04 DIAGNOSIS — F329 Major depressive disorder, single episode, unspecified: Secondary | ICD-10-CM | POA: Diagnosis present

## 2020-01-04 DIAGNOSIS — I255 Ischemic cardiomyopathy: Secondary | ICD-10-CM | POA: Diagnosis present

## 2020-01-04 DIAGNOSIS — Z20822 Contact with and (suspected) exposure to covid-19: Secondary | ICD-10-CM | POA: Diagnosis present

## 2020-01-04 DIAGNOSIS — I25119 Atherosclerotic heart disease of native coronary artery with unspecified angina pectoris: Secondary | ICD-10-CM | POA: Diagnosis present

## 2020-01-04 DIAGNOSIS — M1990 Unspecified osteoarthritis, unspecified site: Secondary | ICD-10-CM | POA: Diagnosis present

## 2020-01-04 DIAGNOSIS — I2510 Atherosclerotic heart disease of native coronary artery without angina pectoris: Secondary | ICD-10-CM | POA: Diagnosis not present

## 2020-01-04 DIAGNOSIS — K219 Gastro-esophageal reflux disease without esophagitis: Secondary | ICD-10-CM | POA: Diagnosis present

## 2020-01-04 DIAGNOSIS — R079 Chest pain, unspecified: Secondary | ICD-10-CM | POA: Diagnosis not present

## 2020-01-04 DIAGNOSIS — R778 Other specified abnormalities of plasma proteins: Secondary | ICD-10-CM

## 2020-01-04 DIAGNOSIS — F419 Anxiety disorder, unspecified: Secondary | ICD-10-CM | POA: Diagnosis present

## 2020-01-04 DIAGNOSIS — E1165 Type 2 diabetes mellitus with hyperglycemia: Secondary | ICD-10-CM | POA: Diagnosis present

## 2020-01-04 DIAGNOSIS — I5043 Acute on chronic combined systolic (congestive) and diastolic (congestive) heart failure: Secondary | ICD-10-CM | POA: Diagnosis present

## 2020-01-04 DIAGNOSIS — R0789 Other chest pain: Secondary | ICD-10-CM | POA: Diagnosis present

## 2020-01-04 DIAGNOSIS — N529 Male erectile dysfunction, unspecified: Secondary | ICD-10-CM | POA: Diagnosis present

## 2020-01-04 DIAGNOSIS — R55 Syncope and collapse: Secondary | ICD-10-CM | POA: Diagnosis present

## 2020-01-04 DIAGNOSIS — Z8249 Family history of ischemic heart disease and other diseases of the circulatory system: Secondary | ICD-10-CM

## 2020-01-04 DIAGNOSIS — E869 Volume depletion, unspecified: Secondary | ICD-10-CM | POA: Diagnosis present

## 2020-01-04 DIAGNOSIS — Z79899 Other long term (current) drug therapy: Secondary | ICD-10-CM

## 2020-01-04 DIAGNOSIS — N183 Chronic kidney disease, stage 3 unspecified: Secondary | ICD-10-CM | POA: Diagnosis present

## 2020-01-04 DIAGNOSIS — R739 Hyperglycemia, unspecified: Secondary | ICD-10-CM | POA: Diagnosis present

## 2020-01-04 DIAGNOSIS — I4821 Permanent atrial fibrillation: Secondary | ICD-10-CM | POA: Diagnosis not present

## 2020-01-04 DIAGNOSIS — R197 Diarrhea, unspecified: Secondary | ICD-10-CM | POA: Diagnosis present

## 2020-01-04 DIAGNOSIS — I340 Nonrheumatic mitral (valve) insufficiency: Secondary | ICD-10-CM | POA: Diagnosis not present

## 2020-01-04 DIAGNOSIS — I5022 Chronic systolic (congestive) heart failure: Secondary | ICD-10-CM

## 2020-01-04 DIAGNOSIS — Z515 Encounter for palliative care: Secondary | ICD-10-CM | POA: Diagnosis present

## 2020-01-04 DIAGNOSIS — R109 Unspecified abdominal pain: Secondary | ICD-10-CM | POA: Diagnosis present

## 2020-01-04 DIAGNOSIS — E1122 Type 2 diabetes mellitus with diabetic chronic kidney disease: Secondary | ICD-10-CM | POA: Diagnosis present

## 2020-01-04 DIAGNOSIS — I5042 Chronic combined systolic (congestive) and diastolic (congestive) heart failure: Secondary | ICD-10-CM | POA: Diagnosis not present

## 2020-01-04 DIAGNOSIS — Z952 Presence of prosthetic heart valve: Secondary | ICD-10-CM | POA: Diagnosis not present

## 2020-01-04 DIAGNOSIS — E785 Hyperlipidemia, unspecified: Secondary | ICD-10-CM | POA: Diagnosis present

## 2020-01-04 DIAGNOSIS — N400 Enlarged prostate without lower urinary tract symptoms: Secondary | ICD-10-CM | POA: Diagnosis present

## 2020-01-04 DIAGNOSIS — I361 Nonrheumatic tricuspid (valve) insufficiency: Secondary | ICD-10-CM | POA: Diagnosis not present

## 2020-01-04 DIAGNOSIS — Z7901 Long term (current) use of anticoagulants: Secondary | ICD-10-CM

## 2020-01-04 DIAGNOSIS — I130 Hypertensive heart and chronic kidney disease with heart failure and stage 1 through stage 4 chronic kidney disease, or unspecified chronic kidney disease: Secondary | ICD-10-CM | POA: Diagnosis present

## 2020-01-04 DIAGNOSIS — Z888 Allergy status to other drugs, medicaments and biological substances status: Secondary | ICD-10-CM

## 2020-01-04 DIAGNOSIS — E119 Type 2 diabetes mellitus without complications: Secondary | ICD-10-CM

## 2020-01-04 DIAGNOSIS — R7989 Other specified abnormal findings of blood chemistry: Secondary | ICD-10-CM

## 2020-01-04 DIAGNOSIS — Z794 Long term (current) use of insulin: Secondary | ICD-10-CM

## 2020-01-04 DIAGNOSIS — Z7982 Long term (current) use of aspirin: Secondary | ICD-10-CM

## 2020-01-04 LAB — URINALYSIS, ROUTINE W REFLEX MICROSCOPIC
Bacteria, UA: NONE SEEN
Bilirubin Urine: NEGATIVE
Glucose, UA: >=500 mg/dL — AB
Hgb urine dipstick: NEGATIVE
Ketones, ur: NEGATIVE mg/dL
Leukocytes,Ua: NEGATIVE
Nitrite: NEGATIVE
Protein, ur: NEGATIVE mg/dL
Specific Gravity, Urine: 1.015 (ref 1.005–1.030)
pH: 5 (ref 5.0–8.0)

## 2020-01-04 LAB — COMPREHENSIVE METABOLIC PANEL
ALT: 23 U/L (ref 0–44)
AST: 25 U/L (ref 15–41)
Albumin: 3.9 g/dL (ref 3.5–5.0)
Alkaline Phosphatase: 87 U/L (ref 38–126)
Anion gap: 13 (ref 5–15)
BUN: 23 mg/dL (ref 8–23)
CO2: 20 mmol/L — ABNORMAL LOW (ref 22–32)
Calcium: 8.9 mg/dL (ref 8.9–10.3)
Chloride: 91 mmol/L — ABNORMAL LOW (ref 98–111)
Creatinine, Ser: 1.45 mg/dL — ABNORMAL HIGH (ref 0.61–1.24)
GFR calc Af Amer: 56 mL/min — ABNORMAL LOW (ref >60)
GFR calc non Af Amer: 48 mL/min — ABNORMAL LOW (ref >60)
Glucose, Bld: 627 mg/dL (ref 70–99)
Potassium: 4.5 mmol/L (ref 3.5–5.1)
Sodium: 124 mmol/L — ABNORMAL LOW (ref 135–145)
Total Bilirubin: 1.3 mg/dL — ABNORMAL HIGH (ref 0.3–1.2)
Total Protein: 6.6 g/dL (ref 6.5–8.1)

## 2020-01-04 LAB — CBC WITH DIFFERENTIAL/PLATELET
Abs Immature Granulocytes: 0.03 10*3/uL (ref 0.00–0.07)
Basophils Absolute: 0.1 10*3/uL (ref 0.0–0.1)
Basophils Relative: 1 %
Eosinophils Absolute: 0 10*3/uL (ref 0.0–0.5)
Eosinophils Relative: 0 %
HCT: 42 % (ref 39.0–52.0)
Hemoglobin: 14.4 g/dL (ref 13.0–17.0)
Immature Granulocytes: 0 %
Lymphocytes Relative: 9 %
Lymphs Abs: 0.7 10*3/uL (ref 0.7–4.0)
MCH: 31 pg (ref 26.0–34.0)
MCHC: 34.3 g/dL (ref 30.0–36.0)
MCV: 90.5 fL (ref 80.0–100.0)
Monocytes Absolute: 0.5 10*3/uL (ref 0.1–1.0)
Monocytes Relative: 7 %
Neutro Abs: 6.3 10*3/uL (ref 1.7–7.7)
Neutrophils Relative %: 83 %
Platelets: 202 10*3/uL (ref 150–400)
RBC: 4.64 MIL/uL (ref 4.22–5.81)
RDW: 13.1 % (ref 11.5–15.5)
WBC: 7.7 10*3/uL (ref 4.0–10.5)
nRBC: 0 % (ref 0.0–0.2)

## 2020-01-04 LAB — POC SARS CORONAVIRUS 2 AG -  ED: SARS Coronavirus 2 Ag: NEGATIVE

## 2020-01-04 LAB — POCT I-STAT EG7
Acid-base deficit: 1 mmol/L (ref 0.0–2.0)
Bicarbonate: 25.5 mmol/L (ref 20.0–28.0)
Calcium, Ion: 1.18 mmol/L (ref 1.15–1.40)
HCT: 43 % (ref 39.0–52.0)
Hemoglobin: 14.6 g/dL (ref 13.0–17.0)
O2 Saturation: 95 %
Potassium: 4.6 mmol/L (ref 3.5–5.1)
Sodium: 128 mmol/L — ABNORMAL LOW (ref 135–145)
TCO2: 27 mmol/L (ref 22–32)
pCO2, Ven: 45.9 mmHg (ref 44.0–60.0)
pH, Ven: 7.353 (ref 7.250–7.430)
pO2, Ven: 81 mmHg — ABNORMAL HIGH (ref 32.0–45.0)

## 2020-01-04 LAB — PROTIME-INR
INR: 1.4 — ABNORMAL HIGH (ref 0.8–1.2)
Prothrombin Time: 16.6 seconds — ABNORMAL HIGH (ref 11.4–15.2)

## 2020-01-04 LAB — CBG MONITORING, ED: Glucose-Capillary: >600 mg/dL (ref 70–99)

## 2020-01-04 LAB — TROPONIN I (HIGH SENSITIVITY)
Troponin I (High Sensitivity): 68 ng/L — ABNORMAL HIGH (ref <18)
Troponin I (High Sensitivity): 100 ng/L (ref <18)
Troponin I (High Sensitivity): 149 ng/L (ref <18)

## 2020-01-04 LAB — POC OCCULT BLOOD, ED: Fecal Occult Bld: NEGATIVE

## 2020-01-04 LAB — BRAIN NATRIURETIC PEPTIDE: B Natriuretic Peptide: 165.2 pg/mL — ABNORMAL HIGH (ref 0.0–100.0)

## 2020-01-04 MED ORDER — HEPARIN (PORCINE) 25000 UT/250ML-% IV SOLN
1500.0000 [IU]/h | INTRAVENOUS | Status: AC
  Administered 2020-01-04 – 2020-01-05 (×2): 1300 [IU]/h via INTRAVENOUS
  Administered 2020-01-06: 1500 [IU]/h via INTRAVENOUS
  Administered 2020-01-06: 1300 [IU]/h via INTRAVENOUS
  Administered 2020-01-07: 1500 [IU]/h via INTRAVENOUS
  Filled 2020-01-04 (×5): qty 250

## 2020-01-04 MED ORDER — HEPARIN BOLUS VIA INFUSION
4000.0000 [IU] | Freq: Once | INTRAVENOUS | Status: AC
  Administered 2020-01-04: 4000 [IU] via INTRAVENOUS
  Filled 2020-01-04: qty 4000

## 2020-01-04 MED ORDER — SODIUM CHLORIDE 0.9 % IV SOLN: Freq: Once | INTRAVENOUS | Status: AC

## 2020-01-04 MED ORDER — HEPARIN (PORCINE) 25000 UT/250ML-% IV SOLN: 12.0000 [IU]/kg/h | INTRAVENOUS | Status: DC

## 2020-01-04 MED ORDER — INSULIN ASPART 100 UNIT/ML ~~LOC~~ SOLN
6.0000 [IU] | Freq: Once | SUBCUTANEOUS | Status: AC
  Administered 2020-01-04: 6 [IU] via INTRAVENOUS

## 2020-01-04 NOTE — H&P (Addendum)
TRH H&P    Patient Demographics:    [name] [name], is a 69 y.o. male  MRN: [id number]  DOB - 1951-08-19  Admit Date - 01/04/2020  Referring MD/NP/PA:   [facility] Lanius  Outpatient Primary MD for the patient is [name], [place]  Patient coming from:  home  Chief complaint-  Chest pain, dyspnea.    HPI:    [name] [name]  is a 69 y.o. male,  w hypertension, hyperlipidemia, Dm2, CKD stage3, CAD s/p BMS to LAD 2000, PTCA 2nd diag 2010, DES LAD and DES OM1 2016, Mitral valve prolapse/ moderate MR, h/o MVR 2004, CHF (EF 25-30%) s/p AICD, presents with dyspnea for the past 4 days, along with chest pain for the past 2 days. Substernal "sharp" with radiation to the left shoulder/ arm,  Fairly constant.  Pt denies headache, alteration in sense of taste or smell, fever, chills, cough, palp, n/v, diarrhea, brbpr, black stool, dysuria, hematuria.  Pt noted ?syncope to ED physician  Pt was on hospice but was having difficulty getting his medication and presented to ED for evaluation    In ED,  T 97.5, P 94, R 16, Bp 143/98  Pox 98% on RA Wt 108.9kg   CT brain  IMPRESSION: 1. No acute intracranial hemorrhage. 2. No acute/traumatic cervical spine pathology.  CXR IMPRESSION: 1. Evidence of prior median sternotomy. 2. Mild right basilar atelectasis and/or infiltrate. 3. Stable cardiomegaly.   Trop 68->  100-> 149  Wbc 7.7, Hgb 14.4, Plt 202 Na 124, K 4.5, Bun 23, Creatinine 1.45 ASt 25, Alt 23 Glucose 627 Hco3 20, AG 13 BNP 165.2 INR 1.4  FOBT negative Urinalysis negative   ED consulted cardiology and they will follow, no STEMI per ED/ Cardiology   Pt will be admitted for chest pain ? Syncope and hyperglycemia     Review of systems:    In addition to the HPI above,  No Fever-chills, No Headache, No changes with Vision or hearing, No problems swallowing food or Liquids, No Cough  or Shortness of Breath, No Abdominal pain, No Nausea or Vomiting, No Blood in stool or Urine, No dysuria, No new skin rashes or bruises, No new joints pains-aches,  No new weakness, tingling, numbness in any extremity, No recent weight gain or loss, No polyuria, polydypsia or polyphagia, No significant Mental Stressors.  All other systems reviewed and are negative.    Past History of the following :    Past Medical History:  Diagnosis Date  . Anxiety   . Arthritis   . CAD (coronary artery disease)    a.  1993 s/p MI - [employer];  b. s/p BMS to LAD '00;  c. PTCA 2nd diagonal 2010;  d. 02/18/12 Cath: moderate nonobs dzs - med rx;  e.  01/2015 Cath: LM nl, LAD 40-3mISR, 70-818m, d1 90p (3.0x16 Synergy DES), D2 50-60, LCX nl, OM1 50p, 8061m.5x12 Synergy DES), RCA nl, EF 30-35%.  . Chronic combined systolic and diastolic CHF (congestive heart failure) ([place]  a. 12/2014 Echo:  EF 30-35%, Gr2 DD, mod MR, sev dil LA.  . CKD (chronic kidney disease), stage III ([place])   . Depression   . ED (erectile dysfunction)   . GERD (gastroesophageal reflux disease)   . Hyperlipidemia   . Hypertension   . Ischemic cardiomyopathy    a. s/p St. [name] (Atlas) ICD implanted in [place] 2007;  b. 12/2014 Echo: Ef 30-35%.  . MVP (mitral valve prolapse)    a. s/p MV annuloplasty at [facility] 2004.  Marland Kitchen Persistent atrial fibrillation ([place])    a. noted on ICD interrogation '10 - not previously on [place] - CHA2DS2VASc = 5.  . Type II diabetes mellitus ([place])    uncontrolled      Past Surgical History:  Procedure Laterality Date  . CARDIAC DEFIBRILLATOR PLACEMENT  2007   implanted in [place], has a 7001 RV lead and a SJM Atlas ICD followed by Dr Caryl Comes  . CARDIOVERSION N/A 09/29/2016   Procedure: CARDIOVERSION;  Surgeon: Lelon Perla, MD;  Location: [facility] ENDOSCOPY;  Service: Cardiovascular;  Laterality: N/A;  . CARDIOVERSION N/A 12/03/2016   Procedure: CARDIOVERSION;  Surgeon: Dorothy Spark,  MD;  Location: [place];  Service: Cardiovascular;  Laterality: N/A;  . IMPLANTABLE CARDIOVERTER DEFIBRILLATOR (ICD) GENERATOR CHANGE N/A 03/19/2013   SJM Fortify ST DR generator placed by Dr Lovena Le, part of Analyze ST study  . LEFT AND RIGHT HEART CATHETERIZATION WITH CORONARY ANGIOGRAM N/A 01/14/2015   Procedure: LEFT AND RIGHT HEART CATHETERIZATION WITH CORONARY ANGIOGRAM;  Surgeon: Peter M Martinique, MD;  Location: [facility] CATH LAB;  Service: Cardiovascular;  Laterality: N/A;  . LEFT HEART CATHETERIZATION WITH CORONARY ANGIOGRAM N/A 02/17/2012   Procedure: LEFT HEART CATHETERIZATION WITH CORONARY ANGIOGRAM;  Surgeon: Jolaine Artist, MD;  Location: [facility] CATH LAB;  Service: Cardiovascular;  Laterality: N/A;  . MITRAL VALVE ANNULOPLASTY  2004   Archie Endo 02/17/2012  . TEE WITHOUT CARDIOVERSION N/A 09/29/2016   Procedure: TRANSESOPHAGEAL ECHOCARDIOGRAM (TEE);  Surgeon: Lelon Perla, MD;  Location: [facility] ENDOSCOPY;  Service: Cardiovascular;  Laterality: N/A;  . TEE WITHOUT CARDIOVERSION N/A 12/03/2016   Procedure: TRANSESOPHAGEAL ECHOCARDIOGRAM (TEE);  Surgeon: Dorothy Spark, MD;  Location: [facility] ENDOSCOPY;  Service: Cardiovascular;  Laterality: N/A;      Social History:      Social History   Tobacco Use  . Smoking status: Never Smoker  . Smokeless tobacco: Never Used  Substance Use Topics  . Alcohol use: Yes    Alcohol/week: 0.0 standard drinks    Comment: 01/14/2015 "used to drink alot; stopped completely in 1983"       Family History :     Family History  Problem Relation Age of Onset  . Coronary artery disease Mother   . Colon cancer Mother   . Heart attack Mother   . Heart disease Mother   . Heart failure Mother   . Hypertension Mother   . Malignant hyperthermia Mother   . Colon cancer Sister   . Coronary artery disease Brother   . Heart disease Brother       Home Medications:   Prior to Admission medications   Medication Sig Start Date End Date Taking? Authorizing  Provider  ALPRAZolam Duanne Moron) 1 MG tablet Take 1 mg by mouth 3 (three) times daily.   Yes [provider]  aspirin EC 81 MG tablet Take 81 mg by mouth daily.   Yes [provider]  carbamazepine (TEGRETOL) 200 MG tablet Take 200 mg by mouth 2 (two) times daily.  Yes [provider]  furosemide (LASIX) 40 MG tablet Take 40 mg by mouth daily.   Yes [provider]  insulin aspart (NOVOLOG FLEXPEN) 100 UNIT/ML FlexPen Inject 12 Units into the skin 3 (three) times daily with meals.   Yes [provider]  Insulin Glargine (BASAGLAR KWIKPEN) 100 UNIT/ML SOPN Inject 50 Units into the skin 2 (two) times daily.   Yes [provider]  metFORMIN (GLUCOPHAGE-XR) 500 MG 24 hr tablet Take 500 mg by mouth 2 (two) times daily.   Yes [provider]  metoprolol tartrate (LOPRESSOR) 25 MG tablet Take 12.5 mg by mouth 2 (two) times daily.   Yes [provider]  pravastatin (PRAVACHOL) 20 MG tablet Take 20 mg by mouth at bedtime.   Yes [provider]  tamsulosin (FLOMAX) 0.4 MG CAPS capsule Take 1 capsule (0.4 mg total) by mouth daily. 09/30/16  Yes Mikhail, Maryann, DO  valsartan (DIOVAN) 40 MG tablet Take 20 mg by mouth daily.   Yes [provider]  warfarin (COUMADIN) 1 MG tablet Take 1 tablet (1 mg total) by mouth one time only at 6 PM. Patient taking differently: Take 1.5-2 mg by mouth See admin instructions. Take 1 1/2 tablets (1.5 mg) by mouth on Monday and Friday nights, take 2 tablets (2 mg) on Sunday, Tuesday, Wednesday, Thursday, Saturday nights 09/30/16  Yes Mikhail, [place], DO  amiodarone (PACERONE) 200 MG tablet Take 1 tablet (200 mg total) by mouth daily. <PLEASE MAKE APPOINTMENT FOR REFILLS> 05/06/17   Martinique, Peter M, MD  atorvastatin (LIPITOR) 20 MG tablet Take 1 tablet (20 mg total) by mouth daily at 6 PM. 12/17/16   Baldwin Jamaica, PA-C  carvedilol (COREG) 25 MG tablet Take 1 tablet (25 mg total) by mouth 2  (two) times daily with a meal. 12/17/16   Baldwin Jamaica, PA-C  DULoxetine (CYMBALTA) 20 MG capsule Take 20 mg by mouth daily. 02/14/17   [provider]  DULoxetine (CYMBALTA) 30 MG capsule Take 30 mg by mouth daily. 05/23/17   [provider]  nitroGLYCERIN (NITROSTAT) 0.4 MG SL tablet Place 1 tablet (0.4 mg total) under the tongue every 5 (five) minutes x 3 doses as needed for chest pain. 01/15/15   Brett Canales, PA-C  omeprazole (PRILOSEC) 40 MG capsule Take 40 mg by mouth daily.    [provider]  traZODone (DESYREL) 100 MG tablet Take 100 mg by mouth at bedtime as needed for sleep.    [provider]  valsartan (DIOVAN) 160 MG tablet Take 1 tablet (160 mg total) by mouth daily. Patient not taking: Reported on 01/04/2020 03/28/17   Martinique, Peter M, MD     Allergies:     Allergies  Allergen Reactions  . Liraglutide Nausea And Vomiting  . Lisinopril Cough     Physical Exam:   Vitals  Blood pressure 94/82, pulse 82, temperature (!) 97.5 F (36.4 C), temperature source Oral, resp. rate 15, height 6' (1.829 m), weight 108.9 kg, SpO2 97 %.  1.  General: axoxo3  2. Psychiatric: euthymic  3. Neurologic: Nonfocal, cn2-12 intact, reflexes 2+ symmetric, diffuse with no clonus, motor 5/5 in all 4 ext  4. HEENMT:  Anicteric, pupils 1.69m symmetric, direct, consensual intact Neck: no jvd  5. Respiratory : CTAB  6. Cardiovascular : rrr s1, s2, no m/g/r  7. Gastrointestinal:  Abd: soft, nt, nd, +bs  8. Skin:  Ext: no c/c/e, no rash  9.Musculoskeletal:  Good ROM  Data Review:    CBC Recent Labs  Lab 01/04/20 1642 01/04/20 1655  WBC 7.7  --   HGB 14.4 14.6  HCT 42.0 43.0  PLT 202  --   MCV 90.5  --   MCH 31.0  --   MCHC 34.3  --   RDW 13.1  --   LYMPHSABS 0.7  --   MONOABS 0.5  --   EOSABS 0.0  --   BASOSABS 0.1  --     ------------------------------------------------------------------------------------------------------------------  Results for orders placed or performed during the hospital encounter of 01/04/20 (from the past 48 hour(s))  Urinalysis, Routine w reflex microscopic     Status: Abnormal   Collection Time: 01/04/20  4:25 PM  Result Value Ref Range   Color, Urine STRAW (A) YELLOW   APPearance CLEAR CLEAR   Specific Gravity, Urine 1.015 1.005 - 1.030   pH 5.0 5.0 - 8.0   Glucose, UA >=500 (A) NEGATIVE mg/dL   Hgb urine dipstick NEGATIVE NEGATIVE   Bilirubin Urine NEGATIVE NEGATIVE   Ketones, ur NEGATIVE NEGATIVE mg/dL   Protein, ur NEGATIVE NEGATIVE mg/dL   Nitrite NEGATIVE NEGATIVE   Leukocytes,Ua NEGATIVE NEGATIVE   RBC / HPF 0-5 0 - 5 RBC/hpf   Bacteria, UA NONE SEEN NONE SEEN    Comment: Performed at [facility], [address] [place], [place] [address]  POC CBG, ED     Status: Abnormal   Collection Time: 01/04/20  4:30 PM  Result Value Ref Range   Glucose-Capillary >600 (HH) 70 - 99 mg/dL   Comment 1 Notify RN    Comment 2 Document in Chart   Troponin I (High Sensitivity)     Status: Abnormal   Collection Time: 01/04/20  4:41 PM  Result Value Ref Range   Troponin I (High Sensitivity) 68 (H) <18 ng/L    Comment: (NOTE) Elevated high sensitivity troponin I (hsTnI) values and significant  changes across serial measurements may suggest ACS but many other  chronic and acute conditions are known to elevate hsTnI results.  Refer to the "Links" section for chest pain algorithms and additional  guidance. Performed at [facility], [place] [address], [place], [place] [address]   Protime-INR     Status: Abnormal   Collection Time: 01/04/20  4:41 PM  Result Value Ref Range   Prothrombin Time 16.6 (H) 11.4 - 15.2 seconds   INR 1.4 (H) 0.8 - 1.2    Comment: (NOTE) INR goal varies based on device and disease states. Performed at [facility], [place] [address], [place], [place] [address]   Comprehensive metabolic panel     Status: Abnormal   Collection Time: 01/04/20  4:42 PM  Result Value Ref Range   Sodium 124 (L) 135 - 145 mmol/L   Potassium 4.5 3.5 - 5.1 mmol/L   Chloride 91 (L) 98 - 111 mmol/L   CO2 20 (L) 22 - 32 mmol/L   Glucose, Bld 627 (HH) 70 - 99 mg/dL    Comment: CRITICAL RESULT CALLED TO, READ BACK BY AND VERIFIED WITH: A STRADER RN AT 1744 ON [id number] BY K FORSYTH    BUN 23 8 - 23 mg/dL   Creatinine, Ser 1.45 (H) 0.61 - 1.24 mg/dL   Calcium 8.9 8.9 - 10.3 mg/dL   Total Protein 6.6 6.5 - 8.1 g/dL   Albumin 3.9 3.5 - 5.0 g/dL   AST 25 15 - 41 U/L   ALT 23 0 -  44 U/L   Alkaline Phosphatase 87 38 - 126 U/L   Total Bilirubin 1.3 (H) 0.3 - 1.2 mg/dL   GFR calc non Af Amer 48 (L) >60 mL/min   GFR calc Af Amer 56 (L) >60 mL/min   Anion gap 13 5 - 15    Comment: Performed at [facility] [address], [place], [place] [address]  CBC with Differential     Status: None   Collection Time: 01/04/20  4:42 PM  Result Value Ref Range   WBC 7.7 4.0 - 10.5 K/uL   RBC 4.64 4.22 - 5.81 MIL/uL   Hemoglobin 14.4 13.0 - 17.0 g/dL   HCT 42.0 39.0 - 52.0 %   MCV 90.5 80.0 - 100.0 fL   MCH 31.0 26.0 - 34.0 pg   MCHC 34.3 30.0 - 36.0 g/dL   RDW 13.1 11.5 - 15.5 %   Platelets 202 150 - 400 K/uL   nRBC 0.0 0.0 - 0.2 %   Neutrophils Relative % 83 %   Neutro Abs 6.3 1.7 - 7.7 K/uL   Lymphocytes Relative 9 %   Lymphs Abs 0.7 0.7 - 4.0 K/uL   Monocytes Relative 7 %   Monocytes Absolute 0.5 0.1 - 1.0 K/uL   Eosinophils Relative 0 %   Eosinophils Absolute 0.0 0.0 - 0.5 K/uL   Basophils Relative 1 %   Basophils Absolute 0.1 0.0 - 0.1 K/uL   Immature Granulocytes 0 %   Abs Immature Granulocytes 0.03 0.00 - 0.07 K/uL    Comment: Performed at [facility], [address], [place], [place] [address]  POC occult blood, ED     Status: None   Collection Time: 01/04/20  4:48 PM  Result Value Ref Range   Fecal Occult Bld NEGATIVE  NEGATIVE  POCT I-Stat EG7     Status: Abnormal   Collection Time: 01/04/20  4:55 PM  Result Value Ref Range   pH, Ven 7.353 7.250 - 7.430   pCO2, Ven 45.9 44.0 - 60.0 mmHg   pO2, Ven 81.0 (H) 32.0 - 45.0 mmHg   Bicarbonate 25.5 20.0 - 28.0 mmol/L   TCO2 27 22 - 32 mmol/L   O2 Saturation 95.0 %   Acid-base deficit 1.0 0.0 - 2.0 mmol/L   Sodium 128 (L) 135 - 145 mmol/L   Potassium 4.6 3.5 - 5.1 mmol/L   Calcium, Ion 1.18 1.15 - 1.40 mmol/L   HCT 43.0 39.0 - 52.0 %   Hemoglobin 14.6 13.0 - 17.0 g/dL   Patient temperature HIDE    Sample type VENOUS   POC SARS Coronavirus 2 Ag-ED - Nasal Swab (BD Veritor Kit)     Status: None   Collection Time: 01/04/20  6:26 PM  Result Value Ref Range   SARS Coronavirus 2 Ag NEGATIVE NEGATIVE    Comment: (NOTE) SARS-CoV-2 antigen NOT DETECTED.  Negative results are presumptive.  Negative results do not preclude SARS-CoV-2 infection and should not be used as the sole basis for treatment or other patient management decisions, including infection  control decisions, particularly in the presence of clinical signs and  symptoms consistent with COVID-19, or in those who have been in contact with the virus.  Negative results must be combined with clinical observations, patient history, and epidemiological information. The expected result is Negative. Fact Sheet for Patients: PodPark.tn Fact Sheet for Healthcare Providers: GiftContent.is This test is not yet approved or cleared by the [place] FDA and  has been authorized  for detection and/or diagnosis of SARS-CoV-2 by FDA under an Emergency Use Authorization (EUA).  This EUA will remain in effect (meaning this test can be used) for the duration of  the COVID-19 de claration under Section 564(b)(1) of the Act, 21 U.S.C. section 360bbb-3(b)(1), unless the authorization is terminated or revoked sooner.   Troponin I (High Sensitivity)      Status: Abnormal   Collection Time: 01/04/20  6:41 PM  Result Value Ref Range   Troponin I (High Sensitivity) 100 (HH) <18 ng/L    Comment: CRITICAL RESULT CALLED TO, READ BACK BY AND VERIFIED WITH: Stefanie Libel RN AT 1951 ON [id number] BY Marcos Eke Performed at [facility], [place] [address] [place], [place] [address]   Troponin I (High Sensitivity)     Status: Abnormal   Collection Time: 01/04/20  9:47 PM  Result Value Ref Range   Troponin I (High Sensitivity) 149 (HH) <18 ng/L    Comment: CRITICAL VALUE NOTED.  VALUE IS CONSISTENT WITH PREVIOUSLY REPORTED AND CALLED VALUE. (NOTE) Elevated high sensitivity troponin I (hsTnI) values and significant  changes across serial measurements may suggest ACS but many other  chronic and acute conditions are known to elevate hsTnI results.  Refer to the Links section for chest pain algorithms and additional  guidance. Performed at [facility], [place] [address] [place], [place] [address]     Chemistries  Recent Labs  Lab 01/04/20 1642 01/04/20 1655  NA 124* 128*  K 4.5 4.6  CL 91*  --   CO2 20*  --   GLUCOSE 627*  --   BUN 23  --   CREATININE 1.45*  --   CALCIUM 8.9  --   AST 25  --   ALT 23  --   ALKPHOS 87  --   BILITOT 1.3*  --    ------------------------------------------------------------------------------------------------------------------  ------------------------------------------------------------------------------------------------------------------ GFR: Estimated Creatinine Clearance: 60.4 mL/min (A) (by C-G formula based on SCr of 1.45 mg/dL (H)). Liver Function Tests: Recent Labs  Lab 01/04/20 1642  AST 25  ALT 23  ALKPHOS 87  BILITOT 1.3*  PROT 6.6  ALBUMIN 3.9   No results for input(s): LIPASE, AMYLASE in the last 168 hours. No results for input(s): AMMONIA in the last 168 hours. Coagulation Profile: Recent Labs  Lab 01/04/20 1641  INR 1.4*   Cardiac Enzymes: No results for input(s):  CKTOTAL, CKMB, CKMBINDEX, TROPONINI in the last 168 hours. BNP (last 3 results) No results for input(s): PROBNP in the last 8760 hours. HbA1C: No results for input(s): HGBA1C in the last 72 hours. CBG: Recent Labs  Lab 01/04/20 1630  GLUCAP >600*   Lipid Profile: No results for input(s): CHOL, HDL, LDLCALC, TRIG, CHOLHDL, LDLDIRECT in the last 72 hours. Thyroid Function Tests: No results for input(s): TSH, T4TOTAL, FREET4, T3FREE, THYROIDAB in the last 72 hours. Anemia Panel: No results for input(s): VITAMINB12, FOLATE, FERRITIN, TIBC, IRON, RETICCTPCT in the last 72 hours.  --------------------------------------------------------------------------------------------------------------- Urine analysis:    Component Value Date/Time   COLORURINE STRAW (A) 01/04/2020 1625   APPEARANCEUR CLEAR 01/04/2020 1625   LABSPEC 1.015 01/04/2020 1625   PHURINE 5.0 01/04/2020 1625   GLUCOSEU >=500 (A) 01/04/2020 1625   HGBUR NEGATIVE 01/04/2020 1625   BILIRUBINUR NEGATIVE 01/04/2020 1625   KETONESUR NEGATIVE 01/04/2020 1625   PROTEINUR NEGATIVE 01/04/2020 1625   NITRITE NEGATIVE 01/04/2020 1625   LEUKOCYTESUR NEGATIVE 01/04/2020 1625      Imaging Results:    CT Head Wo  Contrast  Result Date: 01/04/2020 CLINICAL DATA:  69 year old male with head trauma. EXAM: CT HEAD WITHOUT CONTRAST CT CERVICAL SPINE WITHOUT CONTRAST TECHNIQUE: Multidetector CT imaging of the head and cervical spine was performed following the standard protocol without intravenous contrast. Multiplanar CT image reconstructions of the cervical spine were also generated. COMPARISON:  Head CT dated 09/27/2016. FINDINGS: CT HEAD FINDINGS Brain: There is mild age-related atrophy and chronic microvascular ischemic changes. There is no acute intracranial hemorrhage. No mass effect or shift no extra-axial fluid collection. Vascular: No hyperdense vessel or unexpected calcification. Skull: Normal. Negative for fracture or focal lesion.  Sinuses/Orbits: Mild mucoperiosteal thickening of paranasal sinuses. No air-fluid level. The mastoid air cells are clear. Other: None CT CERVICAL SPINE FINDINGS Alignment: No acute subluxation. Skull base and vertebrae: No acute fracture.  Osteopenia. Soft tissues and spinal canal: No prevertebral fluid or swelling. No visible canal hematoma. Disc levels: No acute findings. C5-C6 ankylosis. Degenerative changes primarily at C6-C7 with endplate irregularity and disc space narrowing and spurring. There is narrowing the left C6-C7 neural foramina. Upper chest: Negative. Other: Bilateral carotid bulb calcified plaques. IMPRESSION: 1. No acute intracranial hemorrhage. 2. No acute/traumatic cervical spine pathology. Electronically Signed   By: Anner Crete M.D.   On: 01/04/2020 21:13   CT Cervical Spine Wo Contrast  Result Date: 01/04/2020 CLINICAL DATA:  69 year old male with head trauma. EXAM: CT HEAD WITHOUT CONTRAST CT CERVICAL SPINE WITHOUT CONTRAST TECHNIQUE: Multidetector CT imaging of the head and cervical spine was performed following the standard protocol without intravenous contrast. Multiplanar CT image reconstructions of the cervical spine were also generated. COMPARISON:  Head CT dated 09/27/2016. FINDINGS: CT HEAD FINDINGS Brain: There is mild age-related atrophy and chronic microvascular ischemic changes. There is no acute intracranial hemorrhage. No mass effect or shift no extra-axial fluid collection. Vascular: No hyperdense vessel or unexpected calcification. Skull: Normal. Negative for fracture or focal lesion. Sinuses/Orbits: Mild mucoperiosteal thickening of paranasal sinuses. No air-fluid level. The mastoid air cells are clear. Other: None CT CERVICAL SPINE FINDINGS Alignment: No acute subluxation. Skull base and vertebrae: No acute fracture.  Osteopenia. Soft tissues and spinal canal: No prevertebral fluid or swelling. No visible canal hematoma. Disc levels: No acute findings. C5-C6  ankylosis. Degenerative changes primarily at C6-C7 with endplate irregularity and disc space narrowing and spurring. There is narrowing the left C6-C7 neural foramina. Upper chest: Negative. Other: Bilateral carotid bulb calcified plaques. IMPRESSION: 1. No acute intracranial hemorrhage. 2. No acute/traumatic cervical spine pathology. Electronically Signed   By: Anner Crete M.D.   On: 01/04/2020 21:13   DG Chest Portable 1 View  Result Date: 01/04/2020 CLINICAL DATA:  Chest pain. EXAM: PORTABLE CHEST 1 VIEW COMPARISON:  March 03, 2018 FINDINGS: There is a dual lead AICD. Multiple sternal wires are noted. Numerous overlying cardiac lead wires are seen. Mild atelectasis and/or infiltrate is seen within the right lung base. There is no evidence of a pleural effusion or pneumothorax. There is moderate to marked severity are gin of the cardiac silhouette. There is mild calcification of the aortic arch. The visualized skeletal structures are unremarkable. IMPRESSION: 1. Evidence of prior median sternotomy. 2. Mild right basilar atelectasis and/or infiltrate. 3. Stable cardiomegaly. Electronically Signed   By: Virgina Norfolk M.D.   On: 01/04/2020 17:51   ekg afib at 80, nl axis, q in v1-3, st elevation in v1-3    Assessment & Plan:    Active Problems:   Chest pain  Chest pain Tele Cycle  cardiac markers Check hga1c, lipid Cont Aspirin Cont Metoprolol 12.4m po bid Cont Lipitor 253mpo qhs Heparin GTT Cardiology consulted by ED, appreciate input  Elevated troponin Cycle cardiac markers Check cardiac echo  Pafib Cont Amiodarone 20038mo qday Heparin GTT  ? Syncope ? arrythmia Check carotid ultrasound Cardiology will interogate pacer  H/o CHF (EF 25-30%) Cont Lasix 50m74m qday  Dm2 Hold Metofrmin  Hold Lantus Fsbs , ISS  Gerd Cont PPI  Bph Cont Flomax  Anxiety Cont Xanax prn  DVT Prophylaxis-   Heparin GTT   SCDs   AM Labs Ordered, also please review Full  Orders  Family Communication: Admission, patients condition and plan of care including tests being ordered have been discussed with the patient who indicate understanding and agree with the plan and Code Status.  Code Status:  FULL CODE per patient, attempted to notify spouse but no response  Admission status: Inpatient: Based on patients clinical presentation and evaluation of above clinical data, I have made determination that patient meets Inpatient criteria at this time.   Pt has high risk of clinical deterioration. Pt has chest pain with elevated troponiin, pt will require > 2 nites stay.   Time spent in minutes : 70    JameJani Gravel on 01/04/2020 at 11:39 PM

## 2020-01-04 NOTE — ED Notes (Signed)
Informed by [facility] [name] rep that patient did not have any incidents that popped up on the pacemaker. Noted that pt is in chronic a-fib, but other than that, nothing abnormal. Stated she would fax over info but due to its long length, provided phone number in case not all of it goes through. Rep is Jarrett Soho and number is [phone]

## 2020-01-04 NOTE — ED Notes (Signed)
MD notified at desk of elevated troponin

## 2020-01-04 NOTE — ED Triage Notes (Addendum)
Pt to ed via EMS from home c/o hyperglycemia cbg 550. HX DM, non compliance with medications d/t social issues. D/C  from Hospice care  and having difficulty obtaining medications. Pt also c/o intermittent chest pain that started yesterday 324 mg aspirin given by EMS. Hx CHF. #20 RAC- 500 ML bolus given by EMS. Last VS: 152/100, hr 87, 100% RA, Temp 98.2. HX pacemaker.

## 2020-01-04 NOTE — ED Notes (Signed)
Date and time results received: 01/04/20 *  Test: Glucose Critical Value: 627  Name of Provider Notified:Lindsey PA

## 2020-01-04 NOTE — ED Provider Notes (Signed)
[facility] EMERGENCY DEPARTMENT Provider Note   CSN: [id number] Arrival date & time: 01/04/20  1621     History Chief Complaint  Patient presents with   Hyperglycemia    [name] [name] is a 69 y.o. male possible history of CAD with bypass, CHF (EF of 30-35%), CKD, depression, GERD, hyperlipidemia, hypertension, diabetes who presents via EMS for evaluation of multiple complaints.  Per patient, for the last 2 days, he has felt generalized weakness, dizziness/lightheadedness, fatigue, chest pain, difficulty breathing.  He states that chest pain is intermittent and is not associated with any particular action.  He describes it as sometimes a heaviness and feels like he cannot do much because of the chest pain.  He states that he has felt nausea at baseline so he does not know if the nausea is associated specifically with the chest pain.  He is also had diaphoresis throughout his course but does not know if it is specifically associated with the chest pain.  He also reports some shortness of breath and cough.  He states cough is nonproductive.  Patient was given 3 and 24 mg aspirin by EMS.  Patient does report that he has been off of his insulin for about 4 months.  He got his insulin yesterday and took 1 dose of it.  He states he is also had diarrhea.  He states that a few days ago he noticed a slight tint of red in his stools. No black or tarry stools.  He states it has been very liquidy and very watery.  He states that it is so extensive that he often cannot make it to the bathroom.  Patient reports today, after going to the bathroom, he walked on the living room and had a syncopal episode.  He thinks he had LOC.  He states that his grandson witnessed it and thinks that his LOC was only a few minutes.  He is currently on Coumadin.  Patient denies any abdominal pain, leg swelling, numbness/weakness of his arms or legs.  The history is provided by the patient.    HPI: A  69 year old patient with a history of treated diabetes presents for evaluation of chest pain. Initial onset of pain was less than one hour ago. The patient's chest pain is described as heaviness/pressure/tightness and is not worse with exertion. The patient complains of nausea and reports some diaphoresis. The patient's chest pain is not middle- or left-sided, is not well-localized, is not sharp and does not radiate to the arms/jaw/neck. The patient has no history of stroke, has no history of peripheral artery disease, has not smoked in the past 90 days, has no relevant family history of coronary artery disease (first degree relative at less than age 64), is not hypertensive, has no history of hypercholesterolemia and does not have an elevated BMI (>=30).   Past Medical History:  Diagnosis Date   Anxiety    Arthritis    CAD (coronary artery disease)    a.  1993 s/p MI - [employer];  b. s/p BMS to LAD '00;  c. PTCA 2nd diagonal 2010;  d. 02/18/12 Cath: moderate nonobs dzs - med rx;  e.  01/2015 Cath: LM nl, LAD 40-50m ISR, 70-47m/d, d1 90p (3.0x16 Synergy DES), D2 50-60, LCX nl, OM1 50p, 74m (2.5x12 Synergy DES), RCA nl, EF 30-35%.   Chronic combined systolic and diastolic CHF (congestive heart failure) ([place])    a. 12/2014 Echo: EF 30-35%, Gr2 DD, mod MR, sev dil LA.  CKD (chronic kidney disease), stage III ([place])    Depression    ED (erectile dysfunction)    GERD (gastroesophageal reflux disease)    Hyperlipidemia    Hypertension    Ischemic cardiomyopathy    a. s/p St. [name] (Atlas) ICD implanted in [place] 2007;  b. 12/2014 Echo: Ef 30-35%.   MVP (mitral valve prolapse)    a. s/p MV annuloplasty at [facility] 2004.   Persistent atrial fibrillation ([place])    a. noted on ICD interrogation '10 - not previously on [place] - CHA2DS2VASc = 5.   Type II diabetes mellitus ([place])    uncontrolled    Patient Active Problem List   Diagnosis Date Noted   Atrial fibrillation with RVR  ([place])    A-fib ([place]) 11/29/2016   Atrial fibrillation ([place]) 11/29/2016   Hyperglycemia    Atrial flutter with rapid ventricular response ([place]) 09/27/2016   Dizziness 09/27/2016   Chest pain    Shortness of breath    S/P coronary artery stent placement    Chronic systolic heart failure ([place]) 04/22/2015   Abnormal nuclear stress test [id number]   Chronic systolic dysfunction of left ventricle 07/25/2013   DM (diabetes mellitus), type 2 ([place]) 03/19/2013   ICD (implantable cardiac defibrillator) battery depletion 03/18/2013   Near syncope 09/15/2012   CAD (coronary artery disease) 02/19/2012   Unstable angina ([place]) 02/19/2012   Automatic implantable cardioverter-defibrillator in situ 08/18/2011   Ischemic cardiomyopathy 05/05/2011   Hypertension    Hyperlipidemia    GERD (gastroesophageal reflux disease)    Non-rheumatic mitral regurgitation    IHD (ischemic heart disease)    Anxiety     Past Surgical History:  Procedure Laterality Date   CARDIAC DEFIBRILLATOR PLACEMENT  2007   implanted in [place], has a 7001 RV lead and a SJM Atlas ICD followed by Dr Caryl Comes   CARDIOVERSION N/A 09/29/2016   Procedure: CARDIOVERSION;  Surgeon: Lelon Perla, MD;  Location: [facility] ENDOSCOPY;  Service: Cardiovascular;  Laterality: N/A;   CARDIOVERSION N/A 12/03/2016   Procedure: CARDIOVERSION;  Surgeon: Dorothy Spark, MD;  Location: [facility];  Service: Cardiovascular;  Laterality: N/A;   IMPLANTABLE CARDIOVERTER DEFIBRILLATOR (ICD) GENERATOR CHANGE N/A 03/19/2013   SJM Fortify ST DR generator placed by Dr Lovena Le, part of Analyze ST study   LEFT AND RIGHT HEART CATHETERIZATION WITH CORONARY ANGIOGRAM N/A 01/14/2015   Procedure: LEFT AND RIGHT HEART CATHETERIZATION WITH CORONARY ANGIOGRAM;  Surgeon: Peter M Martinique, MD;  Location: [facility] CATH LAB;  Service: Cardiovascular;  Laterality: N/A;   LEFT HEART CATHETERIZATION WITH CORONARY ANGIOGRAM N/A 02/17/2012   Procedure: LEFT  HEART CATHETERIZATION WITH CORONARY ANGIOGRAM;  Surgeon: Jolaine Artist, MD;  Location: [facility] CATH LAB;  Service: Cardiovascular;  Laterality: N/A;   MITRAL VALVE ANNULOPLASTY  2004   /notes 02/17/2012   TEE WITHOUT CARDIOVERSION N/A 09/29/2016   Procedure: TRANSESOPHAGEAL ECHOCARDIOGRAM (TEE);  Surgeon: Lelon Perla, MD;  Location: [facility] ENDOSCOPY;  Service: Cardiovascular;  Laterality: N/A;   TEE WITHOUT CARDIOVERSION N/A 12/03/2016   Procedure: TRANSESOPHAGEAL ECHOCARDIOGRAM (TEE);  Surgeon: Dorothy Spark, MD;  Location: [facility] ENDOSCOPY;  Service: Cardiovascular;  Laterality: N/A;       Family History  Problem Relation Age of Onset   Coronary artery disease Mother    Colon cancer Mother    Heart attack Mother    Heart disease Mother    Heart failure Mother    Hypertension Mother    Malignant hyperthermia Mother    Colon cancer  Sister    Coronary artery disease Brother    Heart disease Brother     Social History   Tobacco Use   Smoking status: Never Smoker   Smokeless tobacco: Never Used  Substance Use Topics   Alcohol use: Yes    Alcohol/week: 0.0 standard drinks    Comment: 01/14/2015 "used to drink alot; stopped completely in 1983"   Drug use: No    Comment: 01/14/2015 "used to use about anything I could get; stopped completely in 1983"    Home Medications Prior to Admission medications   Medication Sig Start Date End Date Taking? Authorizing Provider  aspirin EC 81 MG tablet Take 81 mg by mouth daily.   Yes [provider]  amiodarone (PACERONE) 200 MG tablet Take 1 tablet (200 mg total) by mouth daily. <PLEASE MAKE APPOINTMENT FOR REFILLS> 05/06/17   Martinique, Peter M, MD  atorvastatin (LIPITOR) 20 MG tablet Take 1 tablet (20 mg total) by mouth daily at 6 PM. 12/17/16   Baldwin Jamaica, PA-C  carvedilol (COREG) 25 MG tablet Take 1 tablet (25 mg total) by mouth 2 (two) times daily with a meal. 12/17/16   Baldwin Jamaica, PA-C  DULoxetine  (CYMBALTA) 20 MG capsule Take 20 mg by mouth daily. 02/14/17   [provider]  DULoxetine (CYMBALTA) 30 MG capsule Take 30 mg by mouth daily. 05/23/17   [provider]  insulin glargine (LANTUS) 100 UNIT/ML injection Inject 50 Units into the skin at bedtime.    [provider]  nitroGLYCERIN (NITROSTAT) 0.4 MG SL tablet Place 1 tablet (0.4 mg total) under the tongue every 5 (five) minutes x 3 doses as needed for chest pain. 01/15/15   Brett Canales, PA-C  omeprazole (PRILOSEC) 40 MG capsule Take 40 mg by mouth daily.    [provider]  tamsulosin (FLOMAX) 0.4 MG CAPS capsule Take 1 capsule (0.4 mg total) by mouth daily. 09/30/16   Mikhail, Velta Addison, DO  traZODone (DESYREL) 100 MG tablet Take 100 mg by mouth at bedtime as needed for sleep.    [provider]  valsartan (DIOVAN) 160 MG tablet Take 1 tablet (160 mg total) by mouth daily. 03/28/17   Martinique, Peter M, MD  warfarin (COUMADIN) 1 MG tablet Take 1 tablet (1 mg total) by mouth one time only at 6 PM. 09/30/16   Cristal Ford, DO    Allergies    Liraglutide and Lisinopril  Review of Systems   Review of Systems  Constitutional: Positive for fatigue. Negative for fever.  Respiratory: Positive for cough. Negative for shortness of breath.   Cardiovascular: Positive for chest pain.  Gastrointestinal: Positive for diarrhea and nausea. Negative for abdominal pain and vomiting.  Genitourinary: Negative for dysuria and hematuria.  Neurological: Positive for weakness (generalized) and light-headedness. Negative for seizures and headaches.  All other systems reviewed and are negative.   Physical Exam Updated Vital Signs BP 112/83    Pulse 94    Temp (!) 97.5 F (36.4 C) (Oral)    Resp (!) 21    Ht 6' (1.829 m)    Wt 108.9 kg    SpO2 100%    BMI 32.55 kg/m   Physical Exam Vitals and nursing note reviewed. Exam conducted with a chaperone present.  Constitutional:      Appearance: Normal  appearance. He is well-developed.     Comments: Elderly, disheveled appearance  HENT:     Head: Normocephalic and atraumatic.     Comments:  No tenderness to palpation of skull. No deformities or crepitus noted. No open wounds, abrasions or lacerations.  Eyes:     General: Lids are normal.     Conjunctiva/sclera: Conjunctivae normal.     Pupils: Pupils are equal, round, and reactive to light.     Comments: PERRL. EOMs intact. No nystagmus. No neglect.   Cardiovascular:     Rate and Rhythm: Normal rate and regular rhythm.     Pulses: Normal pulses.          Radial pulses are 2+ on the right side and 2+ on the left side.       Dorsalis pedis pulses are 2+ on the right side and 2+ on the left side.     Heart sounds: Normal heart sounds. No murmur. No friction rub. No gallop.   Pulmonary:     Effort: Pulmonary effort is normal.     Breath sounds: Rhonchi present.     Comments: Faint rhonci noted.  Otherwise clear.  No evidence of respiratory distress. Abdominal:     Palpations: Abdomen is soft. Abdomen is not rigid.     Tenderness: There is no abdominal tenderness. There is no guarding.  Genitourinary:    Rectum: Guaiac result negative.     Comments: The exam was performed with a chaperone present. Normal male genitalia. No evidence of rash, ulcers or lesions.  Brown stool noted. Digital Rectal Exam reveals sphincter with good tone. No external hemorrhoids. No masses or fissures. Stool color is brown with no overt blood. Musculoskeletal:        General: Normal range of motion.     Cervical back: Full passive range of motion without pain.  Skin:    General: Skin is warm and dry.     Capillary Refill: Capillary refill takes less than 2 seconds.  Neurological:     Mental Status: He is alert and oriented to person, place, and time.     Comments: Cranial nerves III-XII intact Follows commands, Moves all extremities  5/5 strength to BUE and BLE  Sensation intact throughout all major nerve  distributions No slurred speech. No facial droop.   Psychiatric:        Speech: Speech normal.     ED Results / Procedures / Treatments   Labs (all labs ordered are listed, but only abnormal results are displayed) Labs Reviewed  COMPREHENSIVE METABOLIC PANEL - Abnormal; Notable for the following components:      Result Value   Sodium 124 (*)    Chloride 91 (*)    CO2 20 (*)    Glucose, Bld 627 (*)    Creatinine, Ser 1.45 (*)    Total Bilirubin 1.3 (*)    GFR calc non Af Amer 48 (*)    GFR calc Af Amer 56 (*)    All other components within normal limits  URINALYSIS, ROUTINE W REFLEX MICROSCOPIC - Abnormal; Notable for the following components:   Color, Urine STRAW (*)    Glucose, UA >=500 (*)    All other components within normal limits  PROTIME-INR - Abnormal; Notable for the following components:   Prothrombin Time 16.6 (*)    INR 1.4 (*)    All other components within normal limits  CBG MONITORING, ED - Abnormal; Notable for the following components:   Glucose-Capillary >600 (*)    All other components within normal limits  POCT I-STAT EG7 - Abnormal; Notable for the following components:   pO2, Ven 81.0 (*)  Sodium 128 (*)    All other components within normal limits  TROPONIN I (HIGH SENSITIVITY) - Abnormal; Notable for the following components:   Troponin I (High Sensitivity) 68 (*)    All other components within normal limits  TROPONIN I (HIGH SENSITIVITY) - Abnormal; Notable for the following components:   Troponin I (High Sensitivity) 100 (*)    All other components within normal limits  C DIFFICILE QUICK SCREEN W PCR REFLEX  SARS CORONAVIRUS 2 (TAT 6-24 HRS)  CBC WITH DIFFERENTIAL/PLATELET  BRAIN NATRIURETIC PEPTIDE  I-STAT VENOUS BLOOD GAS, ED  POC SARS CORONAVIRUS 2 AG -  ED  POC OCCULT BLOOD, ED    EKG EKG Interpretation  Date/Time:  Friday January 04 2020 16:35:28 EST Ventricular Rate:  95 PR Interval:    QRS Duration: 129 QT  Interval:  376 QTC Calculation: 442 R Axis:   37 Text Interpretation: Atrial fibrillation Ventricular premature complex Nonspecific intraventricular conduction delay Probable anteroseptal infarct, recent similar to prior EKG Confirmed by Malvin Johns [phone]) on 01/04/2020 5:54:18 PM   Radiology CT Head Wo Contrast  Result Date: 01/04/2020 CLINICAL DATA:  69 year old male with head trauma. EXAM: CT HEAD WITHOUT CONTRAST CT CERVICAL SPINE WITHOUT CONTRAST TECHNIQUE: Multidetector CT imaging of the head and cervical spine was performed following the standard protocol without intravenous contrast. Multiplanar CT image reconstructions of the cervical spine were also generated. COMPARISON:  Head CT dated 09/27/2016. FINDINGS: CT HEAD FINDINGS Brain: There is mild age-related atrophy and chronic microvascular ischemic changes. There is no acute intracranial hemorrhage. No mass effect or shift no extra-axial fluid collection. Vascular: No hyperdense vessel or unexpected calcification. Skull: Normal. Negative for fracture or focal lesion. Sinuses/Orbits: Mild mucoperiosteal thickening of paranasal sinuses. No air-fluid level. The mastoid air cells are clear. Other: None CT CERVICAL SPINE FINDINGS Alignment: No acute subluxation. Skull base and vertebrae: No acute fracture.  Osteopenia. Soft tissues and spinal canal: No prevertebral fluid or swelling. No visible canal hematoma. Disc levels: No acute findings. C5-C6 ankylosis. Degenerative changes primarily at C6-C7 with endplate irregularity and disc space narrowing and spurring. There is narrowing the left C6-C7 neural foramina. Upper chest: Negative. Other: Bilateral carotid bulb calcified plaques. IMPRESSION: 1. No acute intracranial hemorrhage. 2. No acute/traumatic cervical spine pathology. Electronically Signed   By: Anner Crete M.D.   On: 01/04/2020 21:13   CT Cervical Spine Wo Contrast  Result Date: 01/04/2020 CLINICAL DATA:  69 year old male with  head trauma. EXAM: CT HEAD WITHOUT CONTRAST CT CERVICAL SPINE WITHOUT CONTRAST TECHNIQUE: Multidetector CT imaging of the head and cervical spine was performed following the standard protocol without intravenous contrast. Multiplanar CT image reconstructions of the cervical spine were also generated. COMPARISON:  Head CT dated 09/27/2016. FINDINGS: CT HEAD FINDINGS Brain: There is mild age-related atrophy and chronic microvascular ischemic changes. There is no acute intracranial hemorrhage. No mass effect or shift no extra-axial fluid collection. Vascular: No hyperdense vessel or unexpected calcification. Skull: Normal. Negative for fracture or focal lesion. Sinuses/Orbits: Mild mucoperiosteal thickening of paranasal sinuses. No air-fluid level. The mastoid air cells are clear. Other: None CT CERVICAL SPINE FINDINGS Alignment: No acute subluxation. Skull base and vertebrae: No acute fracture.  Osteopenia. Soft tissues and spinal canal: No prevertebral fluid or swelling. No visible canal hematoma. Disc levels: No acute findings. C5-C6 ankylosis. Degenerative changes primarily at C6-C7 with endplate irregularity and disc space narrowing and spurring. There is narrowing the left C6-C7 neural foramina. Upper chest: Negative. Other: Bilateral carotid bulb  calcified plaques. IMPRESSION: 1. No acute intracranial hemorrhage. 2. No acute/traumatic cervical spine pathology. Electronically Signed   By: Anner Crete M.D.   On: 01/04/2020 21:13   DG Chest Portable 1 View  Result Date: 01/04/2020 CLINICAL DATA:  Chest pain. EXAM: PORTABLE CHEST 1 VIEW COMPARISON:  March 03, 2018 FINDINGS: There is a dual lead AICD. Multiple sternal wires are noted. Numerous overlying cardiac lead wires are seen. Mild atelectasis and/or infiltrate is seen within the right lung base. There is no evidence of a pleural effusion or pneumothorax. There is moderate to marked severity are gin of the cardiac silhouette. There is mild calcification  of the aortic arch. The visualized skeletal structures are unremarkable. IMPRESSION: 1. Evidence of prior median sternotomy. 2. Mild right basilar atelectasis and/or infiltrate. 3. Stable cardiomegaly. Electronically Signed   By: Virgina Norfolk M.D.   On: 01/04/2020 17:51    Procedures .Critical Care Performed by: Volanda Napoleon, PA-C Authorized by: Volanda Napoleon, PA-C   Critical care provider statement:    Critical care time (minutes):  30   Critical care was necessary to treat or prevent imminent or life-threatening deterioration of the following conditions: ACS.   Critical care was time spent personally by me on the following activities:  Discussions with consultants, evaluation of patient's response to treatment, examination of patient, ordering and performing treatments and interventions, ordering and review of laboratory studies, ordering and review of radiographic studies, pulse oximetry, re-evaluation of patient's condition, obtaining history from patient or surrogate and review of old charts    (including critical care time)  Medications Ordered in ED Medications  heparin ADULT infusion 100 units/mL (25000 units/259mL sodium chloride 0.45%) (has no administration in time range)  insulin aspart (novoLOG) injection 6 Units (6 Units Intravenous Given 01/04/20 1838)  0.9 %  sodium chloride infusion ( Intravenous New Bag/Given 01/04/20 1853)    ED Course  I have reviewed the triage vital signs and the nursing notes.  Pertinent labs & imaging results that were available during my care of the patient were reviewed by me and considered in my medical decision making (see chart for details).    MDM Rules/Calculators/A&P HEAR Score: 32                    69 year old male with past medical history of CAD, CHF, diabetes brought in by EMS for several days of multiple complaints.  Has recently been on of his insulin for about 4 months.  He had been in the hospice care but he checked  himself out over the last several months and has been living at home.  On initially arrival, he is afebrile, nontoxic-appearing.  He does appear slightly disheveled.  Consider infectious process versus hyperglycemia/DKA versus CAD versus ACS.  Plan to check labs, imaging.  VBG shows pH of 7.353.  O2 is 81, bicarb is 25.  POC occult is negative.  CBC shows no significant leukocytosis or anemia.  INR is 1.4.  POC CBG is greater than 600.  Urine shows no evidence of infectious etiology. No ketones noted. Trop is elevated at 68. CMP shows glucose of 627.  Creatinine is 1.45 which appears to be at baseline.  Bicarb is 20, anion gap is 13.  Chest x-ray shows evidence of prior median sternotomy.  Right basilar atelectasis and/or infiltrate noted.  Discussed patient with Dr. Gilman Schmidt (Cards).  He will come evaluate him in the ED.  I discussed with cardiology after evaluation and here  in the ED.  They recommend starting patient on a heparin drip given that he is subtherapeutic on his Coumadin and with his history of A. fib.  They feel like this is most likely reflective of demand ischemia.  No indication to cath him at this time but they will monitor and follow along.  They do agree with admission.  Initial Covid negative.   CT head negative. No cranial hemorrhage.  CT C-spine negative for any acute abnormalities.  Discussed patient with Dr. Maudie Mercury (hospitalist). He agrees for admission   Portions of this note were generated with [employer]. Dictation errors may occur despite best attempts at proofreading.  Final Clinical Impression(s) / ED Diagnoses Final diagnoses:  Hyperglycemia  Elevated troponin    Rx / DC Orders ED Discharge Orders    None       Desma Mcgregor 01/04/20 2355    Malvin Johns, MD 01/05/20 1539

## 2020-01-04 NOTE — ED Notes (Signed)
Called CT to see where pt was on list; stated pt was going to be transported shortly

## 2020-01-04 NOTE — Progress Notes (Signed)
ANTICOAGULATION CONSULT NOTE - Initial Consult  Pharmacy Consult for heparin Indication: chest pain/ACS  Patient Measurements: Height: 6' (182.9 cm) Weight: 240 lb (108.9 kg) IBW/kg (Calculated) : 77.6 Heparin Dosing Weight: 100.6 kg  Vital Signs: Temp: 97.5 F (36.4 C) (01/22 1630) Temp Source: Oral (01/22 1630) BP: 112/83 (01/22 1930) Pulse Rate: 94 (01/22 2100)  Labs: Recent Labs    01/04/20 1641 01/04/20 1642 01/04/20 1655 01/04/20 1841  HGB  --  14.4 14.6  --   HCT  --  42.0 43.0  --   PLT  --  202  --   --   LABPROT 16.6*  --   --   --   INR 1.4*  --   --   --   CREATININE  --  1.45*  --   --   TROPONINIHS 68*  --   --  100*      Medical History: Past Medical History:  Diagnosis Date  . Anxiety   . Arthritis   . CAD (coronary artery disease)    a.  1993 s/p MI - [employer] [facility];  b. s/p BMS to LAD '00;  c. PTCA 2nd diagonal 2010;  d. 02/18/12 Cath: moderate nonobs dzs - med rx;  e.  01/2015 Cath: LM nl, LAD 40-24m ISR, 70-67m/d, d1 90p (3.0x16 Synergy DES), D2 50-60, LCX nl, OM1 50p, 62m (2.5x12 Synergy DES), RCA nl, EF 30-35%.  . Chronic combined systolic and diastolic CHF (congestive heart failure) ([place])    a. 12/2014 Echo: EF 30-35%, Gr2 DD, mod MR, sev dil LA.  . CKD (chronic kidney disease), stage III ([place])   . Depression   . ED (erectile dysfunction)   . GERD (gastroesophageal reflux disease)   . Hyperlipidemia   . Hypertension   . Ischemic cardiomyopathy    a. s/p St. [name] (Atlas) ICD implanted in [place] 2007;  b. 12/2014 Echo: Ef 30-35%.  . MVP (mitral valve prolapse)    a. s/p MV annuloplasty at [facility] 2004.  Marland Kitchen Persistent atrial fibrillation ([place])    a. noted on ICD interrogation '10 - not previously on [place] - CHA2DS2VASc = 5.  . Type II diabetes mellitus ([place])    uncontrolled     Assessment: History of AFib, on warfarin at home. Current INR = 1.4.  Planning to start heparin for possible ACS. Will bolus given lower INR.  PTA dose:  1.5 mg on Mon/Fri, 2 mg on all other days   Goal of Therapy:  Heparin level 0.3-0.7 units/ml Monitor platelets by anticoagulation protocol: Yes   Plan:  -Heparin bolus 4000 units x1 then 1300 units/hr -Daily HL, CBC -First level in the morning    [name] [name] 01/04/2020,9:37 PM

## 2020-01-04 NOTE — ED Notes (Signed)
St. [name] pacemaker interrogated at bedside.

## 2020-01-04 NOTE — Consult Note (Addendum)
Cardiology Consultation:   Patient ID: [name] MRN: [id number]; DOB: 07-Aug-1951  Admit date: 01/04/2020 Date of Consult: 01/04/2020  Primary Care Provider: [name], [place] Primary Cardiologist: Peter Martinique, MD  Primary Electrophysiologist:  Thompson Grayer, MD    Patient Profile:   [name] [name] is a 69 y.o. male with a hx of persistent atrial fibrillation on coumadin, DM on insulin, CKD stage III, chronic systolic and diastolic heart failure, ischemic cardiomyopathy s/p St [name] AICD, s/p MV repair at [facility] in 2004, CAD s/p multiple PCIs who is being seen today for the evaluation of chest pain at the request of Dr. Tamera Punt.  History of Present Illness:   Mr. Haffey had an abnormal stress test in 2016 leading to right and left heart cath with placement of DES to D1 and first OM, diffuse disease in the LAD treated medically. EF at that time was 30-35%. He follows with Dr. Rayann Heman for atrial fibrillation and had successful TEE/DCCV in 2017. He remains on coumadin and amiodarone. He last saw Dr. Rayann Heman in clinic on 04/24/18 and was doing well at that time. He did report occasional falls. He apparently was in hospice for a period of time for unsteady gait and SOB. He states a doctor in [place] told him he had "stage 4 heart disease".  His daughter was told he had less than 6 months to live, so they had hospice care. After 10 months in hospice care, he checked himself out about 1.5 months ago. He had trouble getting his medications after hospice was no longer giving him his medications. Hospice also was not paying for insulin.   He presented to [facility] with diarrhea, weakness, and trouble walking. He has had severe watery diarrhea. After going to the bathroom today, he walked into the living room and had a syncopal episode with LOC for a few minutes. He denies heart palpitations, but reports CP and SOB. He was disoriented when he regained consciousness. He reports weakness and palpitations  with LOC 2-3 times weekly.  Pt also reports intermittent chest pain that started today. CP was sharp stabbing pain in his left chest with radiation to left arm and left neck. CP has been going on since noon today. This feels like his prior MIs. CP has been occurring for the past 3-4 days at rest and with exertion (walking).  On arrival, blood glucose found to be 627, sCr 1.45 and K 4.6.  Na 124 - corrected to 137 for glucose HS troponin 68 --> 100 INR 1.4 UA with > 500 glucose Fecal occult blood was  Negative  BNP pending He received 324 mg ASA in the ER.   Heart Pathway Score:  HEAR Score: 5  Past Medical History:  Diagnosis Date  . Anxiety   . Arthritis   . CAD (coronary artery disease)    a.  1993 s/p MI - [facility];  b. s/p BMS to LAD '00;  c. PTCA 2nd diagonal 2010;  d. 02/18/12 Cath: moderate nonobs dzs - med rx;  e.  01/2015 Cath: LM nl, LAD 40-57m ISR, 70-79m/d, d1 90p (3.0x16 Synergy DES), D2 50-60, LCX nl, OM1 50p, 85m (2.5x12 Synergy DES), RCA nl, EF 30-35%.  . Chronic combined systolic and diastolic CHF (congestive heart failure) ([place])    a. 12/2014 Echo: EF 30-35%, Gr2 DD, mod MR, sev dil LA.  . CKD (chronic kidney disease), stage III ([place])   . Depression   . ED (erectile dysfunction)   . GERD (gastroesophageal  reflux disease)   . Hyperlipidemia   . Hypertension   . Ischemic cardiomyopathy    a. s/p St. [name] (Atlas) ICD implanted in [place] 2007;  b. 12/2014 Echo: Ef 30-35%.  . MVP (mitral valve prolapse)    a. s/p MV annuloplasty at [facility] 2004.  Marland Kitchen Persistent atrial fibrillation ([place])    a. noted on ICD interrogation '10 - not previously on [place] - CHA2DS2VASc = 5.  . Type II diabetes mellitus ([place])    uncontrolled    Past Surgical History:  Procedure Laterality Date  . CARDIAC DEFIBRILLATOR PLACEMENT  2007   implanted in [place], has a 7001 RV lead and a SJM Atlas ICD followed by Dr Caryl Comes  . CARDIOVERSION N/A 09/29/2016   Procedure: CARDIOVERSION;   Surgeon: Lelon Perla, MD;  Location: [facility] ENDOSCOPY;  Service: Cardiovascular;  Laterality: N/A;  . CARDIOVERSION N/A 12/03/2016   Procedure: CARDIOVERSION;  Surgeon: Dorothy Spark, MD;  Location: [place];  Service: Cardiovascular;  Laterality: N/A;  . IMPLANTABLE CARDIOVERTER DEFIBRILLATOR (ICD) GENERATOR CHANGE N/A 03/19/2013   SJM Fortify ST DR generator placed by Dr Lovena Le, part of Analyze ST study  . LEFT AND RIGHT HEART CATHETERIZATION WITH CORONARY ANGIOGRAM N/A 01/14/2015   Procedure: LEFT AND RIGHT HEART CATHETERIZATION WITH CORONARY ANGIOGRAM;  Surgeon: Peter M Martinique, MD;  Location: [facility] CATH LAB;  Service: Cardiovascular;  Laterality: N/A;  . LEFT HEART CATHETERIZATION WITH CORONARY ANGIOGRAM N/A 02/17/2012   Procedure: LEFT HEART CATHETERIZATION WITH CORONARY ANGIOGRAM;  Surgeon: Jolaine Artist, MD;  Location: [facility] CATH LAB;  Service: Cardiovascular;  Laterality: N/A;  . MITRAL VALVE ANNULOPLASTY  2004   Archie Endo 02/17/2012  . TEE WITHOUT CARDIOVERSION N/A 09/29/2016   Procedure: TRANSESOPHAGEAL ECHOCARDIOGRAM (TEE);  Surgeon: Lelon Perla, MD;  Location: [facility] ENDOSCOPY;  Service: Cardiovascular;  Laterality: N/A;  . TEE WITHOUT CARDIOVERSION N/A 12/03/2016   Procedure: TRANSESOPHAGEAL ECHOCARDIOGRAM (TEE);  Surgeon: Dorothy Spark, MD;  Location: [facility] ENDOSCOPY;  Service: Cardiovascular;  Laterality: N/A;     Home Medications:  Prior to Admission medications   Medication Sig Start Date End Date Taking? Authorizing Provider  amiodarone (PACERONE) 200 MG tablet Take 1 tablet (200 mg total) by mouth daily. <PLEASE MAKE APPOINTMENT FOR REFILLS> 05/06/17   Martinique, Peter M, MD  aspirin EC 81 MG tablet Take 81 mg by mouth daily.    [provider]  atorvastatin (LIPITOR) 20 MG tablet Take 1 tablet (20 mg total) by mouth daily at 6 PM. 12/17/16   Baldwin Jamaica, PA-C  carvedilol (COREG) 25 MG tablet Take 1 tablet (25 mg total) by mouth 2 (two) times daily with a meal.  12/17/16   Baldwin Jamaica, PA-C  DULoxetine (CYMBALTA) 20 MG capsule Take 20 mg by mouth daily. 02/14/17   [provider]  DULoxetine (CYMBALTA) 30 MG capsule Take 30 mg by mouth daily. 05/23/17   [provider]  insulin glargine (LANTUS) 100 UNIT/ML injection Inject 50 Units into the skin at bedtime.    [provider]  nitroGLYCERIN (NITROSTAT) 0.4 MG SL tablet Place 1 tablet (0.4 mg total) under the tongue every 5 (five) minutes x 3 doses as needed for chest pain. 01/15/15   Brett Canales, PA-C  omeprazole (PRILOSEC) 40 MG capsule Take 40 mg by mouth daily.    [provider]  tamsulosin (FLOMAX) 0.4 MG CAPS capsule Take 1 capsule (0.4 mg total) by mouth daily. 09/30/16   Cristal Ford, DO  traZODone ([place])  100 MG tablet Take 100 mg by mouth at bedtime as needed for sleep.    [provider]  valsartan (DIOVAN) 160 MG tablet Take 1 tablet (160 mg total) by mouth daily. 03/28/17   Martinique, Peter M, MD  warfarin (COUMADIN) 1 MG tablet Take 1 tablet (1 mg total) by mouth one time only at 6 PM. 09/30/16   Cristal Ford, DO    Inpatient Medications: Scheduled Meds:  Continuous Infusions:  PRN Meds:   Allergies:    Allergies  Allergen Reactions  . Metformin And Related Nausea Only and Other (See Comments)    Loss of appetite  . Lisinopril Cough    Social History:   Social History   Socioeconomic History  . Marital status: Married    Spouse name: Not on file  . Number of children: 8  . Years of education: Not on file  . Highest education level: Not on file  Occupational History  . Occupation: Community education officer    Comment: disabled  Tobacco Use  . Smoking status: Never Smoker  . Smokeless tobacco: Never Used  Substance and Sexual Activity  . Alcohol use: Yes    Alcohol/week: 0.0 standard drinks    Comment: 01/14/2015 "used to drink alot; stopped completely in 1983"  . Drug use: No    Comment: 01/14/2015 "used to use about anything I  could get; stopped completely in 1983"  . Sexual activity: Never  Other Topics Concern  . Not on file  Social History Narrative  . Not on file   Social Determinants of Health   Financial Resource Strain:   . Difficulty of Paying Living Expenses: Not on file  Food Insecurity:   . Worried About Charity fundraiser in the Last Year: Not on file  . Ran Out of Food in the Last Year: Not on file  Transportation Needs:   . Lack of Transportation (Medical): Not on file  . Lack of Transportation (Non-Medical): Not on file  Physical Activity:   . Days of Exercise per Week: Not on file  . Minutes of Exercise per Session: Not on file  Stress:   . Feeling of Stress : Not on file  Social Connections:   . Frequency of Communication with Friends and Family: Not on file  . Frequency of Social Gatherings with Friends and Family: Not on file  . Attends Religious Services: Not on file  . Active Member of Clubs or Organizations: Not on file  . Attends Archivist Meetings: Not on file  . Marital Status: Not on file  Intimate Partner Violence:   . Fear of Current or Ex-Partner: Not on file  . Emotionally Abused: Not on file  . Physically Abused: Not on file  . Sexually Abused: Not on file    Family History:    Family History  Problem Relation Age of Onset  . Coronary artery disease Mother   . Colon cancer Mother   . Heart attack Mother   . Heart disease Mother   . Heart failure Mother   . Hypertension Mother   . Malignant hyperthermia Mother   . Colon cancer Sister   . Coronary artery disease Brother   . Heart disease Brother      ROS:  Please see the history of present illness.   All other ROS reviewed and negative.     Physical Exam/Data:   Vitals:   01/04/20 1815 01/04/20 1830 01/04/20 1900 01/04/20 1930  BP: 111/82 108/67 128/86 112/83  Pulse: 85 86 85 83  Resp: 18 16 16 18   Temp:      TempSrc:      SpO2: 98% 97% 96% 97%  Weight:      Height:       No  intake or output data in the 24 hours ending 01/04/20 1933 Last 3 Weights 01/04/2020 04/24/2018 12/28/2016  Weight (lbs) 240 lb 237 lb 244 lb  Weight (kg) 108.863 kg 107.502 kg 110.678 kg     Body mass index is 32.55 kg/m.  General:  Obese male in NAD HEENT: normal Neck: no JVD Vascular: No carotid bruits; FA pulses 2+ bilaterally without bruits  Cardiac:  Irregular rhythm, regular rate Lungs:  clear to auscultation bilaterally, no wheezing, rhonchi or rales  Abd: soft, nontender, no hepatomegaly  Ext: no edema Musculoskeletal:  No deformities, BUE and BLE strength normal and equal Skin: warm and dry  Neuro:  CNs 2-12 intact, no focal abnormalities noted Psych:  Normal affect   EKG:  The EKG was personally reviewed and demonstrates:  Atrial fibrillation ventricular rate 95 with IVCD Telemetry:  Telemetry was personally reviewed and demonstrates:  Afib with CVR  Relevant CV Studies:  Heart cath 01/14/15: Final Conclusions:   1. Severe 2 vessel obstructive CAD 2. Severe LV dysfunction. 3. Normal right heart pressures. 4. Successful stenting of the first diagonal with a DES 5. Successful stenting of the first OM with a DES.  Recommendations: Continue DAPT for one year. If patient unable to afford Effient would need to load with Plavix. Continue medical therapy for LV dysfunction. Mitral insufficiency does not appear to be hemodynamically significant. I would treat his LAD disease medically unless he has refractory angina. It is fairly diffuse.    TEE 12/03/16 Study Conclusions  - Left ventricle: Systolic function was severely reduced. The   estimated ejection fraction was in the range of 25% to 30%.   Diffuse hypokinesis. - Aortic valve: Structurally normal valve. Trileaflet; normal   thickness leaflets. There was no significant regurgitation. - Aorta: There was moderate non-mobile atheroma. - Ascending aorta: The ascending aorta was mildly dilated measuring   40 mm. -  Descending aorta: The descending aorta was normal in size. - Mitral valve: There was mild to moderate regurgitation. No   paravalvular leak. - Left atrium: The atrium was dilated. No evidence of thrombus in   the atrial cavity or appendage. No evidence of thrombus in the   atrial cavity or appendage. No evidence of thrombus in the   appendage. - Right ventricle: Systolic function was mildly reduced. - Right atrium: No evidence of thrombus in the atrial cavity or   appendage. - Atrial septum: No defect or patent foramen ovale was identified. - Tricuspid valve: There was mild regurgitation. - Pericardium, extracardiac: There was no pericardial effusion.  Impressions: - TEE was followed by a successful cardioversion.   Laboratory Data:  High Sensitivity Troponin:   Recent Labs  Lab 01/04/20 1641  TROPONINIHS 68*     Chemistry Recent Labs  Lab 01/04/20 1642 01/04/20 1655  NA 124* 128*  K 4.5 4.6  CL 91*  --   CO2 20*  --   GLUCOSE 627*  --   BUN 23  --   CREATININE 1.45*  --   CALCIUM 8.9  --   GFRNONAA 48*  --   GFRAA 56*  --   ANIONGAP 13  --     Recent Labs  Lab 01/04/20 1642  PROT 6.6  ALBUMIN 3.9  AST 25  ALT 23  ALKPHOS 87  BILITOT 1.3*   Hematology Recent Labs  Lab 01/04/20 1642 01/04/20 1655  WBC 7.7  --   RBC 4.64  --   HGB 14.4 14.6  HCT 42.0 43.0  MCV 90.5  --   MCH 31.0  --   MCHC 34.3  --   RDW 13.1  --   PLT 202  --    BNPNo results for input(s): BNP, PROBNP in the last 168 hours.  DDimer No results for input(s): DDIMER in the last 168 hours.   Radiology/Studies:  DG Chest Portable 1 View  Result Date: 01/04/2020 CLINICAL DATA:  Chest pain. EXAM: PORTABLE CHEST 1 VIEW COMPARISON:  March 03, 2018 FINDINGS: There is a dual lead AICD. Multiple sternal wires are noted. Numerous overlying cardiac lead wires are seen. Mild atelectasis and/or infiltrate is seen within the right lung base. There is no evidence of a pleural effusion or  pneumothorax. There is moderate to marked severity are gin of the cardiac silhouette. There is mild calcification of the aortic arch. The visualized skeletal structures are unremarkable. IMPRESSION: 1. Evidence of prior median sternotomy. 2. Mild right basilar atelectasis and/or infiltrate. 3. Stable cardiomegaly. Electronically Signed   By: Virgina Norfolk M.D.   On: 01/04/2020 17:51         Assessment and Plan:   1. Persistent atrial fibrillation 2. Elevated troponin with atypical chest pain 3. CAD multiple PCIs 4. Syncopal episode today - he has not been on a steady medication regimen, unclear what medications he has taken consistently since his INR is sub-therapeutic - hs troponin 68 --> 100 - continue coreg and amiodarone; hold valsartan  - continue ASA - will start heparin drip, no bolus - interrogated his St [name] device in the ER - will obtain echocardiogram - BNP pending - patient does report atypical chest pain - troponin elevation likely demand ischemia, but will follow enzymes   5. Chronic systolic and diastolic heart failure 6. Ischemic cardiomyopathy  - unclear what diuretic regimen he has been on, if any - appeared euvolemic on exam - medications as above - will hold lasix for now given significant diarrhea and does not appear volume overloaded - BNP pending   7. DM - insulin needs are unclear - he did take insulin today - A1c pending - per primary   8. CKD stage III - sCr 1.45 - will hold valsartan for now, pending pressure needs - pressures are [id number] systolic      For questions or updates, please contact [place] Please consult [email or web address] for contact info under     Signed, Ledora Bottcher, [place]  01/04/2020 7:33 PM    Patient seen and examined. Agree with above documentation. Patient is a 69 year old male with a history of CAD status post multiple PCIs, ischemic cardiomyopathy status post ICD, mitral valve repair in 2004, CKD stage  III, persistent atrial fibrillation, IDDM who presents with diarrhea, weakness, syncope, and chest pain. Cardiology is consulted at the request of Dr. Tamera Punt for chest pain. Patient has a significant cardiac history. Last cath in 2016, had stents placed to D1 and OM1. Also with diffuse LAD disease that was treated medically. EF 30 to 35%. He has not been seen in cardiology clinic in [place] since 2019. States that he was living in [place] and told he had end-stage heart disease and entered hospice care. After 10 months of hospice care, he states that he checked  himself out. That was about 6 weeks ago. Reports that he was off all his medications after he checked out of hospice, though he has been able to resume some of his medications (reports that he has been taking warfarin). While on hospice he was not getting insulin. Reports that he just got a prescription for insulin and took his insulin today for the first time in months. He reports that he has been having watery diarrhea x5 days. States that he has felt very weak. Reports he has been having 4-5 episodes of diarrhea per day. Today he was walking back from the bathroom and had a syncopal episode. States that he lost consciousness briefly as he was walking from the bathroom. Reports his family found him on the ground, he does not know what happened. He denies any heart palpitations. Does report that he has been having chest pain and shortness of breath. Reports that he had chest pain that started at noon today. Describes as sharp or stabbing pain on the left side of his chest that radiates to his left arm. Has been having pain for the last 8 hours, though improving. Does report that he has been having episodes of chest pain at rest and with exertion recently. Reports most exertion he does is walking around his home.  In the ED, vitals showed initial BP 143/98, pulse 84, SPO2 98% on room air. Labs notable for creatinine 1.45, blood glucose 627, INR 1.4,  fecal occult blood negative, high-sensitivity troponin 68->100. EKG personally reviewed and shows atrial fibrillation, rate 95, PVC,  nonspecific interventricular conduction delay, Q waves in V1-3 with ~40mm ST elevation (seen on prior EKGs). Telemetry shows atrial fibrillation with rates in 80s to 90s.  On exam, patient is alert and oriented, irregular rhythm, no murmurs, lungs CTAB, no LE edema or JVD.  In regards to his troponin elevation, suspect demand ischemia in the setting of his acute illness, as reports significant fluid losses from diarrhea. High-sensitivity troponin is mildly elevated and chest pain is atypical. Will check TTE. Would trend troponin to peak and check serial EKGs.  Given his atrial fibrillation and subtherapeutic INR, will start on heparin drip. Continue aspirin given his significant CAD history with multiple stents.  For his heart failure, will check TTE.  We will hold diuretics given his significant diarrhea and does not appear volume overloaded on exam. His syncopal episode today could be related to orthostasis in setting of diarrhea, but given his cardiomyopathy he is also at risk of ventricular arrhythmia and it is unclear whether his ICD was deactivated when he went to hospice care. Will interrogate ICD.  Donato Heinz, MD

## 2020-01-05 ENCOUNTER — Other Ambulatory Visit (HOSPITAL_COMMUNITY): Payer: Medicare HMO

## 2020-01-05 ENCOUNTER — Inpatient Hospital Stay (HOSPITAL_COMMUNITY): Payer: Medicare HMO

## 2020-01-05 ENCOUNTER — Other Ambulatory Visit: Payer: Self-pay

## 2020-01-05 DIAGNOSIS — I5043 Acute on chronic combined systolic (congestive) and diastolic (congestive) heart failure: Secondary | ICD-10-CM

## 2020-01-05 DIAGNOSIS — Z794 Long term (current) use of insulin: Secondary | ICD-10-CM

## 2020-01-05 DIAGNOSIS — R55 Syncope and collapse: Secondary | ICD-10-CM

## 2020-01-05 DIAGNOSIS — I4819 Other persistent atrial fibrillation: Secondary | ICD-10-CM

## 2020-01-05 DIAGNOSIS — R079 Chest pain, unspecified: Secondary | ICD-10-CM

## 2020-01-05 DIAGNOSIS — I5042 Chronic combined systolic (congestive) and diastolic (congestive) heart failure: Secondary | ICD-10-CM

## 2020-01-05 DIAGNOSIS — N400 Enlarged prostate without lower urinary tract symptoms: Secondary | ICD-10-CM

## 2020-01-05 DIAGNOSIS — R109 Unspecified abdominal pain: Secondary | ICD-10-CM | POA: Diagnosis present

## 2020-01-05 DIAGNOSIS — E1165 Type 2 diabetes mellitus with hyperglycemia: Secondary | ICD-10-CM

## 2020-01-05 DIAGNOSIS — R197 Diarrhea, unspecified: Secondary | ICD-10-CM | POA: Diagnosis present

## 2020-01-05 DIAGNOSIS — F419 Anxiety disorder, unspecified: Secondary | ICD-10-CM

## 2020-01-05 DIAGNOSIS — K219 Gastro-esophageal reflux disease without esophagitis: Secondary | ICD-10-CM

## 2020-01-05 HISTORY — DX: Unspecified abdominal pain: R10.9

## 2020-01-05 HISTORY — DX: Diarrhea, unspecified: R19.7

## 2020-01-05 LAB — COMPREHENSIVE METABOLIC PANEL
ALT: 21 U/L (ref 0–44)
AST: 22 U/L (ref 15–41)
Albumin: 3.4 g/dL — ABNORMAL LOW (ref 3.5–5.0)
Alkaline Phosphatase: 83 U/L (ref 38–126)
Anion gap: 11 (ref 5–15)
BUN: 20 mg/dL (ref 8–23)
CO2: 20 mmol/L — ABNORMAL LOW (ref 22–32)
Calcium: 8.8 mg/dL — ABNORMAL LOW (ref 8.9–10.3)
Chloride: 105 mmol/L (ref 98–111)
Creatinine, Ser: 1.25 mg/dL — ABNORMAL HIGH (ref 0.61–1.24)
GFR calc Af Amer: >60 mL/min (ref >60)
GFR calc non Af Amer: 58 mL/min — ABNORMAL LOW (ref >60)
Glucose, Bld: 214 mg/dL — ABNORMAL HIGH (ref 70–99)
Potassium: 3.7 mmol/L (ref 3.5–5.1)
Sodium: 136 mmol/L (ref 135–145)
Total Bilirubin: 0.7 mg/dL (ref 0.3–1.2)
Total Protein: 5.7 g/dL — ABNORMAL LOW (ref 6.5–8.1)

## 2020-01-05 LAB — BASIC METABOLIC PANEL
Anion gap: 9 (ref 5–15)
BUN: 21 mg/dL (ref 8–23)
CO2: 23 mmol/L (ref 22–32)
Calcium: 8.9 mg/dL (ref 8.9–10.3)
Chloride: 102 mmol/L (ref 98–111)
Creatinine, Ser: 1.34 mg/dL — ABNORMAL HIGH (ref 0.61–1.24)
GFR calc Af Amer: >60 mL/min (ref >60)
GFR calc non Af Amer: 53 mL/min — ABNORMAL LOW (ref >60)
Glucose, Bld: 279 mg/dL — ABNORMAL HIGH (ref 70–99)
Potassium: 3.7 mmol/L (ref 3.5–5.1)
Sodium: 134 mmol/L — ABNORMAL LOW (ref 135–145)

## 2020-01-05 LAB — CBC
HCT: 40.8 % (ref 39.0–52.0)
Hemoglobin: 14 g/dL (ref 13.0–17.0)
MCH: 30.7 pg (ref 26.0–34.0)
MCHC: 34.3 g/dL (ref 30.0–36.0)
MCV: 89.5 fL (ref 80.0–100.0)
Platelets: 174 10*3/uL (ref 150–400)
RBC: 4.56 MIL/uL (ref 4.22–5.81)
RDW: 13.1 % (ref 11.5–15.5)
WBC: 5.8 10*3/uL (ref 4.0–10.5)
nRBC: 0 % (ref 0.0–0.2)

## 2020-01-05 LAB — HEPARIN LEVEL (UNFRACTIONATED)
Heparin Unfractionated: 0.36 IU/mL (ref 0.30–0.70)
Heparin Unfractionated: 0.41 IU/mL (ref 0.30–0.70)

## 2020-01-05 LAB — GLUCOSE, CAPILLARY
Glucose-Capillary: 195 mg/dL — ABNORMAL HIGH (ref 70–99)
Glucose-Capillary: 237 mg/dL — ABNORMAL HIGH (ref 70–99)
Glucose-Capillary: 335 mg/dL — ABNORMAL HIGH (ref 70–99)
Glucose-Capillary: 302 mg/dL — ABNORMAL HIGH (ref 70–99)

## 2020-01-05 LAB — CBG MONITORING, ED
Glucose-Capillary: 261 mg/dL — ABNORMAL HIGH (ref 70–99)
Glucose-Capillary: 241 mg/dL — ABNORMAL HIGH (ref 70–99)
Glucose-Capillary: 250 mg/dL — ABNORMAL HIGH (ref 70–99)

## 2020-01-05 LAB — SARS CORONAVIRUS 2 (TAT 6-24 HRS): SARS Coronavirus 2: NEGATIVE

## 2020-01-05 LAB — HIV ANTIBODY (ROUTINE TESTING W REFLEX): HIV Screen 4th Generation wRfx: NONREACTIVE

## 2020-01-05 LAB — TROPONIN I (HIGH SENSITIVITY): Troponin I (High Sensitivity): 100 ng/L (ref <18)

## 2020-01-05 LAB — LIPASE, BLOOD: Lipase: 22 U/L (ref 11–51)

## 2020-01-05 MED ORDER — INSULIN GLARGINE 100 UNIT/ML ~~LOC~~ SOLN
20.0000 [IU] | SUBCUTANEOUS | Status: DC
  Administered 2020-01-05 – 2020-01-06 (×2): 20 [IU] via SUBCUTANEOUS
  Filled 2020-01-05 (×2): qty 0.2

## 2020-01-05 MED ORDER — ACETAMINOPHEN 325 MG PO TABS: 650.0000 mg | ORAL_TABLET | Freq: Four times a day (QID) | ORAL | Status: DC | PRN

## 2020-01-05 MED ORDER — ATORVASTATIN CALCIUM 10 MG PO TABS
20.0000 mg | ORAL_TABLET | Freq: Every day | ORAL | Status: DC
  Administered 2020-01-05 – 2020-01-06 (×2): 20 mg via ORAL
  Filled 2020-01-05 (×2): qty 2

## 2020-01-05 MED ORDER — ASPIRIN EC 81 MG PO TBEC
81.0000 mg | DELAYED_RELEASE_TABLET | Freq: Every day | ORAL | Status: DC
  Administered 2020-01-05 – 2020-01-08 (×4): 81 mg via ORAL
  Filled 2020-01-05 (×4): qty 1

## 2020-01-05 MED ORDER — SODIUM CHLORIDE 0.9% FLUSH
3.0000 mL | Freq: Two times a day (BID) | INTRAVENOUS | Status: DC
  Administered 2020-01-05 – 2020-01-08 (×5): 3 mL via INTRAVENOUS

## 2020-01-05 MED ORDER — SODIUM CHLORIDE 0.9% FLUSH: 3.0000 mL | INTRAVENOUS | Status: DC | PRN

## 2020-01-05 MED ORDER — SODIUM CHLORIDE 0.9 % IV SOLN: 250.0000 mL | INTRAVENOUS | Status: DC | PRN

## 2020-01-05 MED ORDER — INSULIN ASPART 100 UNIT/ML ~~LOC~~ SOLN
0.0000 [IU] | SUBCUTANEOUS | Status: DC
  Administered 2020-01-05: 3 [IU] via SUBCUTANEOUS
  Administered 2020-01-05: 7 [IU] via SUBCUTANEOUS
  Administered 2020-01-05: 3 [IU] via SUBCUTANEOUS
  Administered 2020-01-05: 7 [IU] via SUBCUTANEOUS
  Administered 2020-01-05: 5 [IU] via SUBCUTANEOUS
  Administered 2020-01-05: 2 [IU] via SUBCUTANEOUS
  Administered 2020-01-06: 7 [IU] via SUBCUTANEOUS
  Administered 2020-01-06: 5 [IU] via SUBCUTANEOUS
  Administered 2020-01-06: 3 [IU] via SUBCUTANEOUS
  Administered 2020-01-06: 2 [IU] via SUBCUTANEOUS
  Administered 2020-01-06: 5 [IU] via SUBCUTANEOUS
  Administered 2020-01-06: 7 [IU] via SUBCUTANEOUS
  Administered 2020-01-07 (×2): 5 [IU] via SUBCUTANEOUS
  Administered 2020-01-07: 7 [IU] via SUBCUTANEOUS
  Administered 2020-01-07: 5 [IU] via SUBCUTANEOUS
  Administered 2020-01-07: 3 [IU] via SUBCUTANEOUS
  Administered 2020-01-08: 2 [IU] via SUBCUTANEOUS
  Administered 2020-01-08: 3 [IU] via SUBCUTANEOUS
  Administered 2020-01-08: 5 [IU] via SUBCUTANEOUS

## 2020-01-05 MED ORDER — FUROSEMIDE 40 MG PO TABS
40.0000 mg | ORAL_TABLET | Freq: Every day | ORAL | Status: DC
  Administered 2020-01-05 – 2020-01-06 (×2): 40 mg via ORAL
  Filled 2020-01-05 (×2): qty 1

## 2020-01-05 MED ORDER — ALPRAZOLAM 0.5 MG PO TABS: 1.0000 mg | ORAL_TABLET | Freq: Three times a day (TID) | ORAL | Status: DC | PRN

## 2020-01-05 MED ORDER — AMIODARONE HCL 200 MG PO TABS: 200.0000 mg | ORAL_TABLET | Freq: Every day | ORAL | Status: DC

## 2020-01-05 MED ORDER — PANTOPRAZOLE SODIUM 40 MG PO TBEC
40.0000 mg | DELAYED_RELEASE_TABLET | Freq: Every day | ORAL | Status: DC
  Administered 2020-01-05 – 2020-01-08 (×4): 40 mg via ORAL
  Filled 2020-01-05 (×4): qty 1

## 2020-01-05 MED ORDER — ACETAMINOPHEN 650 MG RE SUPP: 650.0000 mg | Freq: Four times a day (QID) | RECTAL | Status: DC | PRN

## 2020-01-05 MED ORDER — METOPROLOL TARTRATE 12.5 MG HALF TABLET
12.5000 mg | ORAL_TABLET | Freq: Two times a day (BID) | ORAL | Status: DC
  Administered 2020-01-05 – 2020-01-06 (×3): 12.5 mg via ORAL
  Filled 2020-01-05 (×3): qty 1

## 2020-01-05 MED ORDER — ALPRAZOLAM 0.5 MG PO TABS: 1.0000 mg | ORAL_TABLET | Freq: Three times a day (TID) | ORAL | Status: DC

## 2020-01-05 MED ORDER — TAMSULOSIN HCL 0.4 MG PO CAPS
0.4000 mg | ORAL_CAPSULE | Freq: Every day | ORAL | Status: DC
  Administered 2020-01-05 – 2020-01-08 (×4): 0.4 mg via ORAL
  Filled 2020-01-05 (×4): qty 1

## 2020-01-05 NOTE — Progress Notes (Signed)
ANTICOAGULATION CONSULT NOTE   Pharmacy Consult for heparin Indication: chest pain/ACS  Patient Measurements: Height: 6' (182.9 cm) Weight: 233 lb (105.7 kg) IBW/kg (Calculated) : 77.6 Heparin Dosing Weight: 100 kg  Vital Signs: Temp: 98.2 F (36.8 C) (01/23 1639) Temp Source: Oral (01/23 1639) BP: 146/98 (01/23 1639) Pulse Rate: 85 (01/23 1639)  Labs: Recent Labs    01/04/20 1641 01/04/20 1641 01/04/20 1642 01/04/20 1642 01/04/20 1655 01/04/20 1841 01/04/20 2147 01/05/20 0026 01/05/20 0712 01/05/20 1619  HGB  --   --  14.4   < > 14.6  --   --   --  14.0  --   HCT  --   --  42.0  --  43.0  --   --   --  40.8  --   PLT  --   --  202  --   --   --   --   --  174  --   LABPROT 16.6*  --   --   --   --   --   --   --   --   --   INR 1.4*  --   --   --   --   --   --   --   --   --   HEPARINUNFRC  --   --   --   --   --   --   --   --  0.36 0.41  CREATININE  --   --  1.45*  --   --   --   --  1.34* 1.25*  --   TROPONINIHS 68*   < >  --   --   --  100* 149*  --  100*  --    < > = values in this interval not displayed.      Medical History: Past Medical History:  Diagnosis Date  . Anxiety   . Arthritis   . CAD (coronary artery disease)    a.  1993 s/p MI - [facility];  b. s/p BMS to LAD '00;  c. PTCA 2nd diagonal 2010;  d. 02/18/12 Cath: moderate nonobs dzs - med rx;  e.  01/2015 Cath: LM nl, LAD 40-80m ISR, 70-5m/d, d1 90p (3.0x16 Synergy DES), D2 50-60, LCX nl, OM1 50p, 4m (2.5x12 Synergy DES), RCA nl, EF 30-35%.  . Chronic combined systolic and diastolic CHF (congestive heart failure) ([place])    a. 12/2014 Echo: EF 30-35%, Gr2 DD, mod MR, sev dil LA.  . CKD (chronic kidney disease), stage III ([place])   . Depression   . ED (erectile dysfunction)   . GERD (gastroesophageal reflux disease)   . Hyperlipidemia   . Hypertension   . Ischemic cardiomyopathy    a. s/p St. [name] (Atlas) ICD implanted in [place] 2007;  b. 12/2014 Echo: Ef 30-35%.  . MVP (mitral valve  prolapse)    a. s/p MV annuloplasty at [facility] 2004.  Marland Kitchen Persistent atrial fibrillation ([place])    a. noted on ICD interrogation '10 - not previously on [place] - CHA2DS2VASc = 5.  . Type II diabetes mellitus ([place])    uncontrolled     Assessment: 70 yom with history of AFib, on warfarin at home, INR on admission 1.4. PTA dose: 1.5 mg on Mon/Fri, 2 mg on all other days. Patient found to have elevated troponins, peaked at 149. Pharmacy to dose heparin.   Confirmatory heparin level is therapeutic at 0.41.  Goal of  Therapy:  Heparin level 0.3-0.7 units/ml Monitor platelets by anticoagulation protocol: Yes   Plan:  -Continue heparin infusion at 1300 units/hr  -Daily heparin level, CBC -Monitor for signs of bleeding  -Follow up when to resume warfarin   Vertis Kelch, PharmD, BCPS PGY2 Cardiology Pharmacy Resident 01/05/2020       5:00 PM  Please check [email or web address] for unit-specific pharmacist phone numbers

## 2020-01-05 NOTE — Progress Notes (Signed)
Progress Note  Patient Name: [name] [name] Date of Encounter: 01/05/2020  Primary Cardiologist: Peter Martinique, MD   Subjective   Feeling better.  Diarrhea has resolved.  Currently has no chest pain or shortness of breath.  Reports episodes of his legs giving out and wants to know if he could participate in cardiac rehab.  Expresses frustration that he was put in hospice and is not ready to die.  Inpatient Medications    Scheduled Meds: . aspirin EC  81 mg Oral Daily  . atorvastatin  20 mg Oral q1800  . furosemide  40 mg Oral Daily  . insulin aspart  0-9 Units Subcutaneous Q4H  . metoprolol tartrate  12.5 mg Oral BID  . pantoprazole  40 mg Oral Daily  . sodium chloride flush  3 mL Intravenous Q12H  . tamsulosin  0.4 mg Oral Daily   Continuous Infusions: . sodium chloride    . heparin 1,300 Units/hr (01/04/20 2157)   PRN Meds: sodium chloride, acetaminophen **OR** acetaminophen, ALPRAZolam, sodium chloride flush   Vital Signs    Vitals:   01/05/20 0515 01/05/20 0605 01/05/20 0638 01/05/20 0753  BP:  (!) 135/97  106/64  Pulse: 75 81  73  Resp: (!) 21 20  18   Temp:  (!) 97.5 F (36.4 C)    TempSrc:  Oral    SpO2: 100% 100%  99%  Weight:   105.7 kg   Height:   6' (1.829 m)     Intake/Output Summary (Last 24 hours) at 01/05/2020 0854 Last data filed at 01/05/2020 [id number] Gross per 24 hour  Intake --  Output 550 ml  Net -550 ml   Last 3 Weights 01/05/2020 01/04/2020 04/24/2018  Weight (lbs) 233 lb 240 lb 237 lb  Weight (kg) 105.688 kg 108.863 kg 107.502 kg      Telemetry    Atrial fibrillation.  Rate 90s to 100s.  No events.- Personally Reviewed  ECG    Atrial fibrillation.  Rate 78 bpm. PVCs.  Prior anterior infarct.   - Personally Reviewed  Physical Exam   VS:  BP 106/64 (BP Location: Right Arm)   Pulse 73   Temp (!) 97.5 F (36.4 C) (Oral)   Resp 18   Ht 6' (1.829 m)   Wt 105.7 kg   SpO2 99%   BMI 31.60 kg/m  , BMI Body mass index is 31.6  kg/m. GENERAL:  Well appearing.  No acute distress HEENT: Pupils equal round and reactive, fundi not visualized, oral mucosa unremarkable NECK:  No jugular venous distention, waveform within normal limits, carotid upstroke brisk and symmetric, no bruits LUNGS:  Diminished at the bases HEART:  Irregularly irregular.  PMI not displaced or sustained,S1 and S2 within normal limits, no S3, no S4, no clicks, no rubs, no murmurs ABD:  Flat, positive bowel sounds normal in frequency in pitch, no bruits, no rebound, no guarding, no midline pulsatile mass, no hepatomegaly, no splenomegaly EXT:  2 plus pulses throughout, no edema, no cyanosis no clubbing SKIN:  No rashes no nodules NEURO:  Cranial nerves II through XII grossly intact, motor grossly intact throughout PSYCH:  Cognitively intact, oriented to person place and time   Labs    High Sensitivity Troponin:   Recent Labs  Lab 01/04/20 1641 01/04/20 1841 01/04/20 2147 01/05/20 0712  TROPONINIHS 68* 100* 149* 100*      Chemistry Recent Labs  Lab 01/04/20 1642 01/04/20 1642 01/04/20 1655 01/05/20 0026 01/05/20 0712  NA 124*   < >  128* 134* 136  K 4.5   < > 4.6 3.7 3.7  CL 91*  --   --  102 105  CO2 20*  --   --  23 20*  GLUCOSE 627*  --   --  279* 214*  BUN 23  --   --  21 20  CREATININE 1.45*  --   --  1.34* 1.25*  CALCIUM 8.9  --   --  8.9 8.8*  PROT 6.6  --   --   --  5.7*  ALBUMIN 3.9  --   --   --  3.4*  AST 25  --   --   --  22  ALT 23  --   --   --  21  ALKPHOS 87  --   --   --  83  BILITOT 1.3*  --   --   --  0.7  GFRNONAA 48*  --   --  53* 58*  GFRAA 56*  --   --  >60 >60  ANIONGAP 13  --   --  9 11   < > = values in this interval not displayed.     Hematology Recent Labs  Lab 01/04/20 1642 01/04/20 1655 01/05/20 0712  WBC 7.7  --  5.8  RBC 4.64  --  4.56  HGB 14.4 14.6 14.0  HCT 42.0 43.0 40.8  MCV 90.5  --  89.5  MCH 31.0  --  30.7  MCHC 34.3  --  34.3  RDW 13.1  --  13.1  PLT 202  --  174     BNP Recent Labs  Lab 01/04/20 1642  BNP 165.2*     DDimer No results for input(s): DDIMER in the last 168 hours.   Radiology    CT Head Wo Contrast  Result Date: 01/04/2020 CLINICAL DATA:  69 year old male with head trauma. EXAM: CT HEAD WITHOUT CONTRAST CT CERVICAL SPINE WITHOUT CONTRAST TECHNIQUE: Multidetector CT imaging of the head and cervical spine was performed following the standard protocol without intravenous contrast. Multiplanar CT image reconstructions of the cervical spine were also generated. COMPARISON:  Head CT dated 09/27/2016. FINDINGS: CT HEAD FINDINGS Brain: There is mild age-related atrophy and chronic microvascular ischemic changes. There is no acute intracranial hemorrhage. No mass effect or shift no extra-axial fluid collection. Vascular: No hyperdense vessel or unexpected calcification. Skull: Normal. Negative for fracture or focal lesion. Sinuses/Orbits: Mild mucoperiosteal thickening of paranasal sinuses. No air-fluid level. The mastoid air cells are clear. Other: None CT CERVICAL SPINE FINDINGS Alignment: No acute subluxation. Skull base and vertebrae: No acute fracture.  Osteopenia. Soft tissues and spinal canal: No prevertebral fluid or swelling. No visible canal hematoma. Disc levels: No acute findings. C5-C6 ankylosis. Degenerative changes primarily at C6-C7 with endplate irregularity and disc space narrowing and spurring. There is narrowing the left C6-C7 neural foramina. Upper chest: Negative. Other: Bilateral carotid bulb calcified plaques. IMPRESSION: 1. No acute intracranial hemorrhage. 2. No acute/traumatic cervical spine pathology. Electronically Signed   By: Anner Crete M.D.   On: 01/04/2020 21:13   CT Cervical Spine Wo Contrast  Result Date: 01/04/2020 CLINICAL DATA:  69 year old male with head trauma. EXAM: CT HEAD WITHOUT CONTRAST CT CERVICAL SPINE WITHOUT CONTRAST TECHNIQUE: Multidetector CT imaging of the head and cervical spine was performed  following the standard protocol without intravenous contrast. Multiplanar CT image reconstructions of the cervical spine were also generated. COMPARISON:  Head CT dated 09/27/2016. FINDINGS: CT HEAD FINDINGS  Brain: There is mild age-related atrophy and chronic microvascular ischemic changes. There is no acute intracranial hemorrhage. No mass effect or shift no extra-axial fluid collection. Vascular: No hyperdense vessel or unexpected calcification. Skull: Normal. Negative for fracture or focal lesion. Sinuses/Orbits: Mild mucoperiosteal thickening of paranasal sinuses. No air-fluid level. The mastoid air cells are clear. Other: None CT CERVICAL SPINE FINDINGS Alignment: No acute subluxation. Skull base and vertebrae: No acute fracture.  Osteopenia. Soft tissues and spinal canal: No prevertebral fluid or swelling. No visible canal hematoma. Disc levels: No acute findings. C5-C6 ankylosis. Degenerative changes primarily at C6-C7 with endplate irregularity and disc space narrowing and spurring. There is narrowing the left C6-C7 neural foramina. Upper chest: Negative. Other: Bilateral carotid bulb calcified plaques. IMPRESSION: 1. No acute intracranial hemorrhage. 2. No acute/traumatic cervical spine pathology. Electronically Signed   By: Anner Crete M.D.   On: 01/04/2020 21:13   DG Chest Portable 1 View  Result Date: 01/04/2020 CLINICAL DATA:  Chest pain. EXAM: PORTABLE CHEST 1 VIEW COMPARISON:  March 03, 2018 FINDINGS: There is a dual lead AICD. Multiple sternal wires are noted. Numerous overlying cardiac lead wires are seen. Mild atelectasis and/or infiltrate is seen within the right lung base. There is no evidence of a pleural effusion or pneumothorax. There is moderate to marked severity are gin of the cardiac silhouette. There is mild calcification of the aortic arch. The visualized skeletal structures are unremarkable. IMPRESSION: 1. Evidence of prior median sternotomy. 2. Mild right basilar atelectasis  and/or infiltrate. 3. Stable cardiomegaly. Electronically Signed   By: Virgina Norfolk M.D.   On: 01/04/2020 17:51    Cardiac Studies   Heart cath 01/14/15: Final Conclusions:  1. Severe 2 vessel obstructive CAD 2. Severe LV dysfunction. 3. Normal right heart pressures. 4. Successful stenting of the first diagonal with a DES 5. Successful stenting of the first OM with a DES.  Recommendations: Continue DAPT for one year. If patient unable to afford Effient would need to load with Plavix. Continue medical therapy for LV dysfunction. Mitral insufficiency does not appear to be hemodynamically significant. I would treat his LAD disease medically unless he has refractory angina. It is fairly diffuse.    TEE 12/03/16 Study Conclusions  - Left ventricle: Systolic function was severely reduced. The estimated ejection fraction was in the range of 25% to 30%. Diffuse hypokinesis. - Aortic valve: Structurally normal valve. Trileaflet; normal thickness leaflets. There was no significant regurgitation. - Aorta: There was moderate non-mobile atheroma. - Ascending aorta: The ascending aorta was mildly dilated measuring 40 mm. - Descending aorta: The descending aorta was normal in size. - Mitral valve: There was mild to moderate regurgitation. No paravalvular leak. - Left atrium: The atrium was dilated. No evidence of thrombus in the atrial cavity or appendage. No evidence of thrombus in the atrial cavity or appendage. No evidence of thrombus in the appendage. - Right ventricle: Systolic function was mildly reduced. - Right atrium: No evidence of thrombus in the atrial cavity or appendage. - Atrial septum: No defect or patent foramen ovale was identified. - Tricuspid valve: There was mild regurgitation. - Pericardium, extracardiac: There was no pericardial effusion.   Patient Profile     69 y.o. male with CAD status post multiple PCI's, chronic systolic and  diastolic heart failure, status post [facility] [name] ICD, status post mitral valve repair, persistent atrial fibrillation, DM, CKD III, admitted with syncope and chest pain.  Assessment & Plan    # Chronic systolic and  diastolic heart failure: I/O not well-recorded.  Weight decreased from 108.9 kg to 105.7 kg.  He currently denies shortness of breath and is lying flat in bed.    His home regimen of cardiac medications is clear.  He was off many medicines due to being on hospice.  Pressure is currently well-controlled.  He was admitted with diarrhea and likely intravascular volume depletion.  We will need to switch metoprolol to succinate or carvedilol.  If his blood pressure allows consider adding Entresto.  Echo pending.  He is currently euvolemic.  No diuretic for now, especially in the setting of diarrhea.  # Syncope: Likely due to intravascular volume depletion as above.  We will get his device interrogated today.  We will ensure that the defibrillator is turned on.  He thinks that they turned it off when he went to hospice.  # Chest pain:  Hs troponin peaked at 149.  He had chest pain, but this was in the setting of being off many of his medications and not having insulin for months.  We will get him back on a stable regimen and if he continues to have chest pain consider cardiac catheterization versus stress testing.  # Persistent atrial fibrillation:  Unclear whether he was taking amiodarone on admission.  He is currently in atrial fibrillation and rates are poorly-controlled.  He received his first dose of metoprolol this morning.  Prior to discharge we will plan to consolidate to succinate as above.  Warfarin is on hold and he is on heparin.  If his echocardiogram is stable, we will resume warfarin as he is unlikely to need any invasive testing this admission.  # DM: He was not receiving insulin while on hospice.  He would be a good candidate for an SGLT2 inhibitor.  Appreciate internal medicine  assistance.  # CKD 3: Renal function improving.  Home valsartan and diuretics on hold.        For questions or updates, please contact [place] Please consult [email or web address] for contact info under        Signed, Skeet Latch, MD  01/05/2020, 8:54 AM

## 2020-01-05 NOTE — Progress Notes (Signed)
Carotid artery duplex completed. Refer to "CV Proc" under chart review to view preliminary results.  01/05/2020 2:58 PM Kelby Aline., MHA, RVT, RDCS, RDMS

## 2020-01-05 NOTE — Progress Notes (Signed)
PROGRESS NOTE    [name] [name]  KXF:[id number] DOB: 10-28-1951 DOA: 01/04/2020 PCP: [name], PA    Brief Narrative: HPI per Dr. Keene Breath [name] [name]  is a 69 y.o. male,  w hypertension, hyperlipidemia, Dm2, CKD stage3, CAD s/p BMS to LAD 2000, PTCA 2nd diag 2010, DES LAD and DES OM1 2016, Mitral valve prolapse/ moderate MR, h/o MVR 2004, CHF (EF 25-30%) s/p AICD, presents with dyspnea for the past 4 days, along with chest pain for the past 2 days. Substernal "sharp" with radiation to the left shoulder/ arm,  Fairly constant.  Pt denies headache, alteration in sense of taste or smell, fever, chills, cough, palp, n/v, diarrhea, brbpr, black stool, dysuria, hematuria.  Pt noted ?syncope to ED physician  Pt was on hospice but was having difficulty getting his medication and presented to ED for evaluation    In ED,  T 97.5, P 94, R 16, Bp 143/98  Pox 98% on RA Wt 108.9kg   CT brain  IMPRESSION: 1. No acute intracranial hemorrhage. 2. No acute/traumatic cervical spine pathology.  CXR IMPRESSION: 1. Evidence of prior median sternotomy. 2. Mild right basilar atelectasis and/or infiltrate. 3. Stable cardiomegaly.   Trop 68->  100-> 149  Wbc 7.7, Hgb 14.4, Plt 202 Na 124, K 4.5, Bun 23, Creatinine 1.45 ASt 25, Alt 23 Glucose 627 Hco3 20, AG 13 BNP 165.2 INR 1.4  FOBT negative Urinalysis negative   ED consulted cardiology and they will follow, no STEMI per ED/ Cardiology   Pt will be admitted for chest pain ? Syncope and hyperglycemia    Assessment & Plan:   Principal Problem:   Chest pain Active Problems:   CAD (coronary artery disease)   Syncope   DM (diabetes mellitus), type 2 (HCC)   Diarrhea   Abdominal pain   Chronic combined systolic and diastolic heart failure (HCC)   Benign prostatic hyperplasia  1 chest pain Patient presented with chest pain noted to have elevated troponins as high as 149.  Patient noted to have been on  hospice and after discharge from hospice not on any of his cardiac medications since then.  2D echo pending.  Continue aspirin, Lipitor, oral Lasix, metoprolol, IV heparin.  Cardiology following and appreciate input and recommendations.  2.?? Syncope Per admitting physician ED physician did state patient had a syncopal episode.  Patient also noted to have had multiple episodes of diarrhea prior to admission.  Concern for possible volume depletion.  Patient not been on any medications since discharge from hospice.  High-sensitivity troponins were rising/elevated.  Patient started on a heparin drip per cardiology.  St. [name]'s device to be interrogated per cardiology.  2D echo pending.  Per cardiology.  3.  Chronic combined systolic and diastolic heart failure Patient currently denies any shortness of breath or chest pain.  Patient does not look volume overloaded on examination.  Patient noted have been off his cardiac medications after being placed in hospice.  On admission patient noted to, bouts of diarrhea and likely volume depleted.  2D echo pending.  Continue current cardiac medications of oral Lasix, Lipitor, aspirin, metoprolol.  Cardiology following and appreciate input and recommendations.  4.  Diarrhea C. difficile PCR pending.  Patient with no bouts of diarrhea today however did have some bouts last night.  If C. difficile PCR is negative will place on Imodium as needed.  Supportive care.  5.  Persistent atrial fibrillation Patient currently in A. fib and irregularly irregular.  Continue metoprolol  for rate control.  Patient currently on IV heparin.  2D echo pending.  Cardiology following.  6.  Diabetes mellitus type 2 Check a hemoglobin A1c.  CBG of 195 this morning.  Oral hypoglycemic agents on hold.  Start Lantus 20 units daily.  Continue sliding scale insulin.  Follow.  7.  Gastroesophageal reflux disease PPI.  8.  BPH Continue Flomax.  9.  Anxiety Xanax as needed.   DVT  prophylaxis: Heparin Code Status: Full Family Communication: Updated patient.  No family at bedside. Disposition Plan:   Patient came from: Home             Anticipated d/c place: Home  Barriers to d/c OR conditions which need to be met to effect a safe d/c: Clinical improvement and clearance by cardiology.   Consultants:   Cardiology: Dr. Gardiner Rhyme 01/05/2020  Procedures:  CT head CT/ C-spine 01/04/2020  Abdominal films pending 01/05/2020  Chest x-ray 01/04/2020  Carotid ultrasound pending 01/05/2020  Antimicrobials:   None   Subjective: Patient sleeping however easily arousable.  Patient with some complaints of upper abdominal discomfort.  Patient denies any chest pain this morning.  Denies any significant shortness of breath.  Patient stated had been having chest pain for a few days prior to admission sharp in nature radiating to his neck left upper shoulder and extremity.  Patient states was on hospice however is no longer on hospice and refusing needs further evaluation of his heart.  Patient noted to have diarrhea last night but none this morning.  Objective: Vitals:   01/05/20 0605 01/05/20 0638 01/05/20 0753 01/05/20 1300  BP: (!) 135/97  106/64 108/66  Pulse: 81  73 76  Resp: 20  18 18   Temp: (!) 97.5 F (36.4 C)   97.6 F (36.4 C)  TempSrc: Oral   Oral  SpO2: 100%  99% 98%  Weight:  105.7 kg    Height:  6' (1.829 m)      Intake/Output Summary (Last 24 hours) at 01/05/2020 1518 Last data filed at 01/05/2020 1500 Gross per 24 hour  Intake 240 ml  Output 550 ml  Net -310 ml   Filed Weights   01/04/20 1630 01/05/20 0638  Weight: 108.9 kg 105.7 kg    Examination:  General exam: Appears calm and comfortable  Respiratory system: Decreased breath sounds in the bases.  No wheezing.  No crackles noted.  Respiratory effort normal. Cardiovascular system: Irregularly irregular. No JVD, murmurs, rubs, gallops or clicks. No pedal edema. Gastrointestinal system:  Upper abdomen with some abdominal discomfort.  Soft, nontender, positive bowel sounds.  Central nervous system: Alert and oriented. No focal neurological deficits. Extremities: Symmetric 5 x 5 power. Skin: No rashes, lesions or ulcers Psychiatry: Judgement and insight appear normal. Mood & affect appropriate.     Data Reviewed: I have personally reviewed following labs and imaging studies  CBC: Recent Labs  Lab 01/04/20 1642 01/04/20 1655 01/05/20 0712  WBC 7.7  --  5.8  NEUTROABS 6.3  --   --   HGB 14.4 14.6 14.0  HCT 42.0 43.0 40.8  MCV 90.5  --  89.5  PLT 202  --  956   Basic Metabolic Panel: Recent Labs  Lab 01/04/20 1642 01/04/20 1655 01/05/20 0026 01/05/20 0712  NA 124* 128* 134* 136  K 4.5 4.6 3.7 3.7  CL 91*  --  102 105  CO2 20*  --  23 20*  GLUCOSE 627*  --  279* 214*  BUN 23  --  21 20  CREATININE 1.45*  --  1.34* 1.25*  CALCIUM 8.9  --  8.9 8.8*   GFR: Estimated Creatinine Clearance: 69.1 mL/min (A) (by C-G formula based on SCr of 1.25 mg/dL (H)). Liver Function Tests: Recent Labs  Lab 01/04/20 1642 01/05/20 0712  AST 25 22  ALT 23 21  ALKPHOS 87 83  BILITOT 1.3* 0.7  PROT 6.6 5.7*  ALBUMIN 3.9 3.4*   Recent Labs  Lab 01/05/20 0712  LIPASE 22   No results for input(s): AMMONIA in the last 168 hours. Coagulation Profile: Recent Labs  Lab 01/04/20 1641  INR 1.4*   Cardiac Enzymes: No results for input(s): CKTOTAL, CKMB, CKMBINDEX, TROPONINI in the last 168 hours. BNP (last 3 results) No results for input(s): PROBNP in the last 8760 hours. HbA1C: No results for input(s): HGBA1C in the last 72 hours. CBG: Recent Labs  Lab 01/05/20 0102 01/05/20 0238 01/05/20 0442 01/05/20 0726 01/05/20 1207  GLUCAP 261* 241* 250* 195* 237*   Lipid Profile: No results for input(s): CHOL, HDL, LDLCALC, TRIG, CHOLHDL, LDLDIRECT in the last 72 hours. Thyroid Function Tests: No results for input(s): TSH, T4TOTAL, FREET4, T3FREE, THYROIDAB in the  last 72 hours. Anemia Panel: No results for input(s): VITAMINB12, FOLATE, FERRITIN, TIBC, IRON, RETICCTPCT in the last 72 hours. Sepsis Labs: No results for input(s): PROCALCITON, LATICACIDVEN in the last 168 hours.  Recent Results (from the past 240 hour(s))  SARS CORONAVIRUS 2 (TAT 6-24 HRS) Nasopharyngeal Nasopharyngeal Swab     Status: None   Collection Time: 01/04/20  8:40 PM   Specimen: Nasopharyngeal Swab  Result Value Ref Range Status   SARS Coronavirus 2 NEGATIVE NEGATIVE Final    Comment: (NOTE) SARS-CoV-2 target nucleic acids are NOT DETECTED. The SARS-CoV-2 RNA is generally detectable in upper and lower respiratory specimens during the acute phase of infection. Negative results do not preclude SARS-CoV-2 infection, do not rule out co-infections with other pathogens, and should not be used as the sole basis for treatment or other patient management decisions. Negative results must be combined with clinical observations, patient history, and epidemiological information. The expected result is Negative. Fact Sheet for Patients: SugarRoll.be Fact Sheet for Healthcare Providers: https://www.woods-mathews.com/ This test is not yet approved or cleared by the [place] FDA and  has been authorized for detection and/or diagnosis of SARS-CoV-2 by FDA under an Emergency Use Authorization (EUA). This EUA will remain  in effect (meaning this test can be used) for the duration of the COVID-19 declaration under Section 56 4(b)(1) of the Act, 21 U.S.C. section 360bbb-3(b)(1), unless the authorization is terminated or revoked sooner. Performed at [facility], [place] [address], [place], [place] [address]          Radiology Studies: CT Head Wo Contrast  Result Date: 01/04/2020 CLINICAL DATA:  69 year old male with head trauma. EXAM: CT HEAD WITHOUT CONTRAST CT CERVICAL SPINE WITHOUT CONTRAST TECHNIQUE: Multidetector CT imaging of  the head and cervical spine was performed following the standard protocol without intravenous contrast. Multiplanar CT image reconstructions of the cervical spine were also generated. COMPARISON:  Head CT dated 09/27/2016. FINDINGS: CT HEAD FINDINGS Brain: There is mild age-related atrophy and chronic microvascular ischemic changes. There is no acute intracranial hemorrhage. No mass effect or shift no extra-axial fluid collection. Vascular: No hyperdense vessel or unexpected calcification. Skull: Normal. Negative for fracture or focal lesion. Sinuses/Orbits: Mild mucoperiosteal thickening of paranasal sinuses. No air-fluid level. The mastoid air cells are clear. Other: None CT  CERVICAL SPINE FINDINGS Alignment: No acute subluxation. Skull base and vertebrae: No acute fracture.  Osteopenia. Soft tissues and spinal canal: No prevertebral fluid or swelling. No visible canal hematoma. Disc levels: No acute findings. C5-C6 ankylosis. Degenerative changes primarily at C6-C7 with endplate irregularity and disc space narrowing and spurring. There is narrowing the left C6-C7 neural foramina. Upper chest: Negative. Other: Bilateral carotid bulb calcified plaques. IMPRESSION: 1. No acute intracranial hemorrhage. 2. No acute/traumatic cervical spine pathology. Electronically Signed   By: Anner Crete M.D.   On: 01/04/2020 21:13   CT Cervical Spine Wo Contrast  Result Date: 01/04/2020 CLINICAL DATA:  69 year old male with head trauma. EXAM: CT HEAD WITHOUT CONTRAST CT CERVICAL SPINE WITHOUT CONTRAST TECHNIQUE: Multidetector CT imaging of the head and cervical spine was performed following the standard protocol without intravenous contrast. Multiplanar CT image reconstructions of the cervical spine were also generated. COMPARISON:  Head CT dated 09/27/2016. FINDINGS: CT HEAD FINDINGS Brain: There is mild age-related atrophy and chronic microvascular ischemic changes. There is no acute intracranial hemorrhage. No mass  effect or shift no extra-axial fluid collection. Vascular: No hyperdense vessel or unexpected calcification. Skull: Normal. Negative for fracture or focal lesion. Sinuses/Orbits: Mild mucoperiosteal thickening of paranasal sinuses. No air-fluid level. The mastoid air cells are clear. Other: None CT CERVICAL SPINE FINDINGS Alignment: No acute subluxation. Skull base and vertebrae: No acute fracture.  Osteopenia. Soft tissues and spinal canal: No prevertebral fluid or swelling. No visible canal hematoma. Disc levels: No acute findings. C5-C6 ankylosis. Degenerative changes primarily at C6-C7 with endplate irregularity and disc space narrowing and spurring. There is narrowing the left C6-C7 neural foramina. Upper chest: Negative. Other: Bilateral carotid bulb calcified plaques. IMPRESSION: 1. No acute intracranial hemorrhage. 2. No acute/traumatic cervical spine pathology. Electronically Signed   By: Anner Crete M.D.   On: 01/04/2020 21:13   DG Chest Portable 1 View  Result Date: 01/04/2020 CLINICAL DATA:  Chest pain. EXAM: PORTABLE CHEST 1 VIEW COMPARISON:  March 03, 2018 FINDINGS: There is a dual lead AICD. Multiple sternal wires are noted. Numerous overlying cardiac lead wires are seen. Mild atelectasis and/or infiltrate is seen within the right lung base. There is no evidence of a pleural effusion or pneumothorax. There is moderate to marked severity are gin of the cardiac silhouette. There is mild calcification of the aortic arch. The visualized skeletal structures are unremarkable. IMPRESSION: 1. Evidence of prior median sternotomy. 2. Mild right basilar atelectasis and/or infiltrate. 3. Stable cardiomegaly. Electronically Signed   By: Virgina Norfolk M.D.   On: 01/04/2020 17:51   DG Abd 2 Views  Result Date: 01/05/2020 CLINICAL DATA:  Abdominal pain EXAM: ABDOMEN - 2 VIEW COMPARISON:  None. FINDINGS: Overall bowel gas pattern is nonobstructive. No evidence of soft tissue mass or abnormal fluid  collection. No evidence of free intraperitoneal air. No evidence of renal or ureteral calculi. Vascular calcifications noted in the pelvis. Lung bases appear clear. Visualized osseous structures are unremarkable. IMPRESSION: Nonobstructive bowel gas pattern and no evidence of acute intra-abdominal abnormality. Electronically Signed   By: Franki Cabot M.D.   On: 01/05/2020 12:32   VAS US CAROTID  Result Date: 01/05/2020 Carotid Arterial Duplex Study Indications:       Syncope. Comparison Study:  No prior study. Performing Technologist: Maudry Mayhew MHA, RDMS, RVT, RDCS  Examination Guidelines: A complete evaluation includes B-mode imaging, spectral Doppler, color Doppler, and power Doppler as needed of all accessible portions of each vessel. Bilateral testing is considered  an integral part of a complete examination. Limited examinations for reoccurring indications may be performed as noted.  Right Carotid Findings: +----------+-------+-------+--------+---------------------------------+--------+             PSV     EDV     Stenosis Plaque Description                Comments              cm/s    cm/s                                                         +----------+-------+-------+--------+---------------------------------+--------+  CCA Prox   82      11                                                           +----------+-------+-------+--------+---------------------------------+--------+  CCA Distal 55      10                                                           +----------+-------+-------+--------+---------------------------------+--------+  ICA Prox   36      10               heterogenous, irregular and                                                      calcific                                    +----------+-------+-------+--------+---------------------------------+--------+  ICA Distal 84      30                                                            +----------+-------+-------+--------+---------------------------------+--------+  ECA        55      12               smooth and heterogenous                     +----------+-------+-------+--------+---------------------------------+--------+ +----------+--------+-------+----------------+-------------------+             PSV cm/s EDV cms Describe         Arm Pressure (mmHG)  +----------+--------+-------+----------------+-------------------+  Subclavian 60               Multiphasic, WNL                      +----------+--------+-------+----------------+-------------------+ +---------+--------+--+--------+-+---------+  Vertebral PSV cm/s 25 EDV  cm/s 8 Antegrade  +---------+--------+--+--------+-+---------+  Left Carotid Findings: +----------+--------+--------+--------+--------------------------+--------+             PSV cm/s EDV cm/s Stenosis Plaque Description         Comments  +----------+--------+--------+--------+--------------------------+--------+  CCA Prox   79       18                heterogenous and smooth              +----------+--------+--------+--------+--------------------------+--------+  CCA Distal 54       13                heterogenous and irregular           +----------+--------+--------+--------+--------------------------+--------+  ICA Prox   47       18                heterogenous and irregular           +----------+--------+--------+--------+--------------------------+--------+  ICA Distal 73       33                                                     +----------+--------+--------+--------+--------------------------+--------+  ECA        83       22                smooth and heterogenous              +----------+--------+--------+--------+--------------------------+--------+ +----------+--------+--------+--------------+-------------------+             PSV cm/s EDV cm/s Describe       Arm Pressure (mmHG)  +----------+--------+--------+--------------+-------------------+  Subclavian                    Not identified                      +----------+--------+--------+--------------+-------------------+ +---------+--------+--+--------+--+---------+  Vertebral PSV cm/s 32 EDV cm/s 11 Antegrade  +---------+--------+--+--------+--+---------+   Summary: Right Carotid: Velocities in the right ICA are consistent with a 1-39% stenosis. Left Carotid: Velocities in the left ICA are consistent with a 1-39% stenosis. Vertebrals:  Bilateral vertebral arteries demonstrate antegrade flow. Subclavians: Left subclavian artery was not visualized. Normal flow hemodynamics              were seen in the right subclavian artery. *See table(s) above for measurements and observations.     Preliminary         Scheduled Meds:  aspirin EC  81 mg Oral Daily   atorvastatin  20 mg Oral q1800   furosemide  40 mg Oral Daily   insulin aspart  0-9 Units Subcutaneous Q4H   metoprolol tartrate  12.5 mg Oral BID   pantoprazole  40 mg Oral Daily   sodium chloride flush  3 mL Intravenous Q12H   tamsulosin  0.4 mg Oral Daily   Continuous Infusions:  sodium chloride     heparin 1,300 Units/hr (01/05/20 1342)     LOS: 1 day    Time spent: 40 minutes    Irine Seal, MD [facility]   To contact the attending provider between 7A-7P or the covering provider during after hours 7P-7A, please log into the web site [email or web address] and access using universal [place] password for that web site. If you do not  have the password, please call the hospital operator.  01/05/2020, 3:18 PM

## 2020-01-05 NOTE — Progress Notes (Signed)
ANTICOAGULATION CONSULT NOTE - Consult  Pharmacy Consult for heparin Indication: chest pain/ACS  Patient Measurements: Height: 6' (182.9 cm) Weight: 233 lb (105.7 kg) IBW/kg (Calculated) : 77.6 Heparin Dosing Weight: 100 kg  Vital Signs: Temp: 97.5 F (36.4 C) (01/23 0605) Temp Source: Oral (01/23 0605) BP: 106/64 (01/23 0753) Pulse Rate: 73 (01/23 0753)  Labs: Recent Labs    01/04/20 1641 01/04/20 1641 01/04/20 1642 01/04/20 1642 01/04/20 1655 01/04/20 1841 01/04/20 2147 01/05/20 0026 01/05/20 0712  HGB  --   --  14.4   < > 14.6  --   --   --  14.0  HCT  --   --  42.0  --  43.0  --   --   --  40.8  PLT  --   --  202  --   --   --   --   --  174  LABPROT 16.6*  --   --   --   --   --   --   --   --   INR 1.4*  --   --   --   --   --   --   --   --   HEPARINUNFRC  --   --   --   --   --   --   --   --  0.36  CREATININE  --   --  1.45*  --   --   --   --  1.34* 1.25*  TROPONINIHS 68*   < >  --   --   --  100* 149*  --  100*   < > = values in this interval not displayed.      Medical History: Past Medical History:  Diagnosis Date  . Anxiety   . Arthritis   . CAD (coronary artery disease)    a.  1993 s/p MI - [employer];  b. s/p BMS to LAD '00;  c. PTCA 2nd diagonal 2010;  d. 02/18/12 Cath: moderate nonobs dzs - med rx;  e.  01/2015 Cath: LM nl, LAD 40-67m ISR, 70-35m/d, d1 90p (3.0x16 Synergy DES), D2 50-60, LCX nl, OM1 50p, 40m (2.5x12 Synergy DES), RCA nl, EF 30-35%.  . Chronic combined systolic and diastolic CHF (congestive heart failure) ([place])    a. 12/2014 Echo: EF 30-35%, Gr2 DD, mod MR, sev dil LA.  . CKD (chronic kidney disease), stage III ([place])   . Depression   . ED (erectile dysfunction)   . GERD (gastroesophageal reflux disease)   . Hyperlipidemia   . Hypertension   . Ischemic cardiomyopathy    a. s/p St. [name] ([employer]) ICD implanted in [place] 2007;  b. 12/2014 Echo: Ef 30-35%.  . MVP (mitral valve prolapse)    a. s/p MV annuloplasty at [facility] 2004.  Marland Kitchen Persistent atrial fibrillation ([place])    a. noted on ICD interrogation '10 - not previously on [place] - CHA2DS2VASc = 5.  . Type II diabetes mellitus ([place])    uncontrolled     Assessment: 9 yom with history of AFib, on warfarin at home, INR on admission 1.4. PTA dose: 1.5 mg on Mon/Fri, 2 mg on all other days. Patient found to have elevated troponins, peaked at 149. Pharmacy to dose heparin.   Heparin level this morning is therapeutic at 0.36 on 1300 units/hour. H&H is stable at 14.0/40.8, plts are also wnl at 174.    Goal of Therapy:  Heparin level 0.3-0.7 units/ml  Monitor platelets by anticoagulation protocol: Yes   Plan:  -Continue heparin infusion at 1300 units/hr -8 hour confirmatory HL today  -Daily HL, CBC -Monitor for signs of bleeding  -Follow up when to resume warfarin    Thank you,   Eddie Candle, PharmD PGY-1 Pharmacy Resident   Please check amion for clinical pharmacist contact number    01/05/2020,9:24 AM

## 2020-01-05 NOTE — ED Notes (Signed)
MD informed of pt's trop increased

## 2020-01-05 NOTE — ED Notes (Signed)
Report given to [facility] RN. All questions anaswered

## 2020-01-06 ENCOUNTER — Inpatient Hospital Stay (HOSPITAL_COMMUNITY): Payer: Medicare HMO

## 2020-01-06 DIAGNOSIS — I340 Nonrheumatic mitral (valve) insufficiency: Secondary | ICD-10-CM

## 2020-01-06 DIAGNOSIS — I361 Nonrheumatic tricuspid (valve) insufficiency: Secondary | ICD-10-CM

## 2020-01-06 LAB — COMPREHENSIVE METABOLIC PANEL
ALT: 22 U/L (ref 0–44)
AST: 23 U/L (ref 15–41)
Albumin: 3.5 g/dL (ref 3.5–5.0)
Alkaline Phosphatase: 81 U/L (ref 38–126)
Anion gap: 9 (ref 5–15)
BUN: 18 mg/dL (ref 8–23)
CO2: 25 mmol/L (ref 22–32)
Calcium: 9.2 mg/dL (ref 8.9–10.3)
Chloride: 104 mmol/L (ref 98–111)
Creatinine, Ser: 1.2 mg/dL (ref 0.61–1.24)
GFR calc Af Amer: >60 mL/min (ref >60)
GFR calc non Af Amer: >60 mL/min (ref >60)
Glucose, Bld: 234 mg/dL — ABNORMAL HIGH (ref 70–99)
Potassium: 4.3 mmol/L (ref 3.5–5.1)
Sodium: 138 mmol/L (ref 135–145)
Total Bilirubin: 0.8 mg/dL (ref 0.3–1.2)
Total Protein: 6.1 g/dL — ABNORMAL LOW (ref 6.5–8.1)

## 2020-01-06 LAB — GLUCOSE, CAPILLARY
Glucose-Capillary: 266 mg/dL — ABNORMAL HIGH (ref 70–99)
Glucose-Capillary: 216 mg/dL — ABNORMAL HIGH (ref 70–99)
Glucose-Capillary: 154 mg/dL — ABNORMAL HIGH (ref 70–99)
Glucose-Capillary: 330 mg/dL — ABNORMAL HIGH (ref 70–99)
Glucose-Capillary: 339 mg/dL — ABNORMAL HIGH (ref 70–99)
Glucose-Capillary: 272 mg/dL — ABNORMAL HIGH (ref 70–99)

## 2020-01-06 LAB — CBC
HCT: 42.5 % (ref 39.0–52.0)
Hemoglobin: 14.3 g/dL (ref 13.0–17.0)
MCH: 30.5 pg (ref 26.0–34.0)
MCHC: 33.6 g/dL (ref 30.0–36.0)
MCV: 90.6 fL (ref 80.0–100.0)
Platelets: 204 10*3/uL (ref 150–400)
RBC: 4.69 MIL/uL (ref 4.22–5.81)
RDW: 13.2 % (ref 11.5–15.5)
WBC: 5.6 10*3/uL (ref 4.0–10.5)
nRBC: 0 % (ref 0.0–0.2)

## 2020-01-06 LAB — LIPID PANEL
Cholesterol: 124 mg/dL (ref 0–200)
HDL: 32 mg/dL — ABNORMAL LOW (ref >40)
LDL Cholesterol: 63 mg/dL (ref 0–99)
Total CHOL/HDL Ratio: 3.9 RATIO
Triglycerides: 147 mg/dL (ref <150)
VLDL: 29 mg/dL (ref 0–40)

## 2020-01-06 LAB — PROTIME-INR
INR: 1.3 — ABNORMAL HIGH (ref 0.8–1.2)
Prothrombin Time: 15.9 seconds — ABNORMAL HIGH (ref 11.4–15.2)

## 2020-01-06 LAB — ECHOCARDIOGRAM COMPLETE
Height: 72 in
Weight: 3697.6 oz

## 2020-01-06 LAB — HEMOGLOBIN A1C
Hgb A1c MFr Bld: 11 % — ABNORMAL HIGH (ref 4.8–5.6)
Mean Plasma Glucose: 269 mg/dL

## 2020-01-06 LAB — HEPARIN LEVEL (UNFRACTIONATED)
Heparin Unfractionated: 0.15 IU/mL — ABNORMAL LOW (ref 0.30–0.70)
Heparin Unfractionated: 0.28 IU/mL — ABNORMAL LOW (ref 0.30–0.70)

## 2020-01-06 LAB — MAGNESIUM: Magnesium: 1.9 mg/dL (ref 1.7–2.4)

## 2020-01-06 MED ORDER — FUROSEMIDE 40 MG PO TABS
40.0000 mg | ORAL_TABLET | Freq: Two times a day (BID) | ORAL | Status: DC
  Administered 2020-01-06 – 2020-01-07 (×2): 40 mg via ORAL
  Filled 2020-01-06 (×2): qty 1

## 2020-01-06 MED ORDER — WARFARIN SODIUM 5 MG PO TABS
5.0000 mg | ORAL_TABLET | Freq: Once | ORAL | Status: AC
  Administered 2020-01-06: 5 mg via ORAL
  Filled 2020-01-06: qty 1

## 2020-01-06 MED ORDER — METOPROLOL SUCCINATE ER 25 MG PO TB24
25.0000 mg | ORAL_TABLET | Freq: Every day | ORAL | Status: DC
  Administered 2020-01-06 – 2020-01-08 (×3): 25 mg via ORAL
  Filled 2020-01-06 (×3): qty 1

## 2020-01-06 MED ORDER — INSULIN GLARGINE 100 UNIT/ML ~~LOC~~ SOLN
30.0000 [IU] | Freq: Every day | SUBCUTANEOUS | Status: DC
  Filled 2020-01-06: qty 0.3

## 2020-01-06 MED ORDER — AMIODARONE HCL 200 MG PO TABS
200.0000 mg | ORAL_TABLET | Freq: Every day | ORAL | Status: DC
  Administered 2020-01-06 – 2020-01-08 (×3): 200 mg via ORAL
  Filled 2020-01-06 (×3): qty 1

## 2020-01-06 MED ORDER — WARFARIN - PHARMACIST DOSING INPATIENT: Freq: Every day | Status: DC

## 2020-01-06 MED ORDER — HEPARIN BOLUS VIA INFUSION
1000.0000 [IU] | Freq: Once | INTRAVENOUS | Status: AC
  Administered 2020-01-06: 1000 [IU] via INTRAVENOUS
  Filled 2020-01-06: qty 1000

## 2020-01-06 MED ORDER — INSULIN GLARGINE 100 UNIT/ML ~~LOC~~ SOLN
10.0000 [IU] | Freq: Once | SUBCUTANEOUS | Status: AC
  Administered 2020-01-06: 10 [IU] via SUBCUTANEOUS
  Filled 2020-01-06: qty 0.1

## 2020-01-06 NOTE — Progress Notes (Signed)
Progress Note  Patient Name: [name] [name] Date of Encounter: 01/06/2020  Primary Cardiologist: Peter Martinique, MD   Subjective   Feeling weak and diaphoretic this Am.  No recurrent diarrhea.  No chest pain.  Feeling a little short of breath this am.   Inpatient Medications    Scheduled Meds: . aspirin EC  81 mg Oral Daily  . atorvastatin  20 mg Oral q1800  . furosemide  40 mg Oral Daily  . insulin aspart  0-9 Units Subcutaneous Q4H  . insulin glargine  10 Units Subcutaneous Once  . [START ON 01/07/2020] insulin glargine  30 Units Subcutaneous Daily  . metoprolol tartrate  12.5 mg Oral BID  . pantoprazole  40 mg Oral Daily  . sodium chloride flush  3 mL Intravenous Q12H  . tamsulosin  0.4 mg Oral Daily   Continuous Infusions: . sodium chloride    . heparin 1,300 Units/hr (01/06/20 0656)   PRN Meds: sodium chloride, acetaminophen **OR** acetaminophen, ALPRAZolam, sodium chloride flush   Vital Signs    Vitals:   01/06/20 0022 01/06/20 0500 01/06/20 0527 01/06/20 0751  BP: 125/90  117/74 (!) 120/93  Pulse: 84  71 88  Resp: 18  18 17   Temp:   98 F (36.7 C) 98.6 F (37 C)  TempSrc:   Oral Oral  SpO2: 100%  99% 100%  Weight:  104.8 kg    Height:        Intake/Output Summary (Last 24 hours) at 01/06/2020 0825 Last data filed at 01/06/2020 0813 Gross per 24 hour  Intake 730 ml  Output 1775 ml  Net -1045 ml   Last 3 Weights 01/06/2020 01/05/2020 01/04/2020  Weight (lbs) 231 lb 1.6 oz 233 lb 240 lb  Weight (kg) 104.826 kg 105.688 kg 108.863 kg      Telemetry    Atrial fibrillation.  Rate 90s to 100s.  No events.- Personally Reviewed  ECG    Atrial fibrillation.  Rate 78 bpm. PVCs.  Prior anterior infarct.   - Personally Reviewed  Physical Exam   VS:  BP (!) 120/93 (BP Location: Right Arm)   Pulse 88   Temp 98.6 F (37 C) (Oral)   Resp 17   Ht 6' (1.829 m)   Wt 104.8 kg   SpO2 100%   BMI 31.34 kg/m  , BMI Body mass index is 31.34 kg/m. GENERAL:   Well appearing.  No acute distress HEENT: Pupils equal round and reactive, fundi not visualized, oral mucosa unremarkable NECK:  No jugular venous distention, waveform within normal limits, carotid upstroke brisk and symmetric, no bruits LUNGS:  Diminished at the bases HEART:  Irregularly irregular.  PMI not displaced or sustained,S1 and S2 within normal limits, no S3, no S4, no clicks, no rubs, no murmurs ABD:  Flat, positive bowel sounds normal in frequency in pitch, no bruits, no rebound, no guarding, no midline pulsatile mass, no hepatomegaly, no splenomegaly EXT:  2 plus pulses throughout, no edema, no cyanosis no clubbing SKIN:  No rashes no nodules NEURO:  Cranial nerves II through XII grossly intact, motor grossly intact throughout PSYCH:  Cognitively intact, oriented to person place and time   Labs    High Sensitivity Troponin:   Recent Labs  Lab 01/04/20 1641 01/04/20 1841 01/04/20 2147 01/05/20 0712  TROPONINIHS 68* 100* 149* 100*      Chemistry Recent Labs  Lab 01/04/20 1642 01/04/20 1655 01/05/20 0026 01/05/20 0712 01/06/20 0532  NA 124*   < >  134* 136 138  K 4.5   < > 3.7 3.7 4.3  CL 91*   < > 102 105 104  CO2 20*   < > 23 20* 25  GLUCOSE 627*   < > 279* 214* 234*  BUN 23   < > 21 20 18   CREATININE 1.45*   < > 1.34* 1.25* 1.20  CALCIUM 8.9   < > 8.9 8.8* 9.2  PROT 6.6  --   --  5.7* 6.1*  ALBUMIN 3.9  --   --  3.4* 3.5  AST 25  --   --  22 23  ALT 23  --   --  21 22  ALKPHOS 87  --   --  83 81  BILITOT 1.3*  --   --  0.7 0.8  GFRNONAA 48*   < > 53* 58* >60  GFRAA 56*   < > >60 >60 >60  ANIONGAP 13   < > 9 11 9    < > = values in this interval not displayed.     Hematology Recent Labs  Lab 01/04/20 1642 01/04/20 1642 01/04/20 1655 01/05/20 0712 01/06/20 0532  WBC 7.7  --   --  5.8 5.6  RBC 4.64  --   --  4.56 4.69  HGB 14.4   < > 14.6 14.0 14.3  HCT 42.0   < > 43.0 40.8 42.5  MCV 90.5  --   --  89.5 90.6  MCH 31.0  --   --  30.7 30.5  MCHC  34.3  --   --  34.3 33.6  RDW 13.1  --   --  13.1 13.2  PLT 202  --   --  174 204   < > = values in this interval not displayed.    BNP Recent Labs  Lab 01/04/20 1642  BNP 165.2*     DDimer No results for input(s): DDIMER in the last 168 hours.   Radiology    CT Head Wo Contrast  Result Date: 01/04/2020 CLINICAL DATA:  69 year old male with head trauma. EXAM: CT HEAD WITHOUT CONTRAST CT CERVICAL SPINE WITHOUT CONTRAST TECHNIQUE: Multidetector CT imaging of the head and cervical spine was performed following the standard protocol without intravenous contrast. Multiplanar CT image reconstructions of the cervical spine were also generated. COMPARISON:  Head CT dated 09/27/2016. FINDINGS: CT HEAD FINDINGS Brain: There is mild age-related atrophy and chronic microvascular ischemic changes. There is no acute intracranial hemorrhage. No mass effect or shift no extra-axial fluid collection. Vascular: No hyperdense vessel or unexpected calcification. Skull: Normal. Negative for fracture or focal lesion. Sinuses/Orbits: Mild mucoperiosteal thickening of paranasal sinuses. No air-fluid level. The mastoid air cells are clear. Other: None CT CERVICAL SPINE FINDINGS Alignment: No acute subluxation. Skull base and vertebrae: No acute fracture.  Osteopenia. Soft tissues and spinal canal: No prevertebral fluid or swelling. No visible canal hematoma. Disc levels: No acute findings. C5-C6 ankylosis. Degenerative changes primarily at C6-C7 with endplate irregularity and disc space narrowing and spurring. There is narrowing the left C6-C7 neural foramina. Upper chest: Negative. Other: Bilateral carotid bulb calcified plaques. IMPRESSION: 1. No acute intracranial hemorrhage. 2. No acute/traumatic cervical spine pathology. Electronically Signed   By: Anner Crete M.D.   On: 01/04/2020 21:13   CT Cervical Spine Wo Contrast  Result Date: 01/04/2020 CLINICAL DATA:  69 year old male with head trauma. EXAM: CT HEAD  WITHOUT CONTRAST CT CERVICAL SPINE WITHOUT CONTRAST TECHNIQUE: Multidetector CT imaging of the head and  cervical spine was performed following the standard protocol without intravenous contrast. Multiplanar CT image reconstructions of the cervical spine were also generated. COMPARISON:  Head CT dated 09/27/2016. FINDINGS: CT HEAD FINDINGS Brain: There is mild age-related atrophy and chronic microvascular ischemic changes. There is no acute intracranial hemorrhage. No mass effect or shift no extra-axial fluid collection. Vascular: No hyperdense vessel or unexpected calcification. Skull: Normal. Negative for fracture or focal lesion. Sinuses/Orbits: Mild mucoperiosteal thickening of paranasal sinuses. No air-fluid level. The mastoid air cells are clear. Other: None CT CERVICAL SPINE FINDINGS Alignment: No acute subluxation. Skull base and vertebrae: No acute fracture.  Osteopenia. Soft tissues and spinal canal: No prevertebral fluid or swelling. No visible canal hematoma. Disc levels: No acute findings. C5-C6 ankylosis. Degenerative changes primarily at C6-C7 with endplate irregularity and disc space narrowing and spurring. There is narrowing the left C6-C7 neural foramina. Upper chest: Negative. Other: Bilateral carotid bulb calcified plaques. IMPRESSION: 1. No acute intracranial hemorrhage. 2. No acute/traumatic cervical spine pathology. Electronically Signed   By: Anner Crete M.D.   On: 01/04/2020 21:13   DG Chest Portable 1 View  Result Date: 01/04/2020 CLINICAL DATA:  Chest pain. EXAM: PORTABLE CHEST 1 VIEW COMPARISON:  March 03, 2018 FINDINGS: There is a dual lead AICD. Multiple sternal wires are noted. Numerous overlying cardiac lead wires are seen. Mild atelectasis and/or infiltrate is seen within the right lung base. There is no evidence of a pleural effusion or pneumothorax. There is moderate to marked severity are gin of the cardiac silhouette. There is mild calcification of the aortic arch. The  visualized skeletal structures are unremarkable. IMPRESSION: 1. Evidence of prior median sternotomy. 2. Mild right basilar atelectasis and/or infiltrate. 3. Stable cardiomegaly. Electronically Signed   By: Virgina Norfolk M.D.   On: 01/04/2020 17:51   DG Abd 2 Views  Result Date: 01/05/2020 CLINICAL DATA:  Abdominal pain EXAM: ABDOMEN - 2 VIEW COMPARISON:  None. FINDINGS: Overall bowel gas pattern is nonobstructive. No evidence of soft tissue mass or abnormal fluid collection. No evidence of free intraperitoneal air. No evidence of renal or ureteral calculi. Vascular calcifications noted in the pelvis. Lung bases appear clear. Visualized osseous structures are unremarkable. IMPRESSION: Nonobstructive bowel gas pattern and no evidence of acute intra-abdominal abnormality. Electronically Signed   By: Franki Cabot M.D.   On: 01/05/2020 12:32   VAS US CAROTID  Result Date: 01/05/2020 Carotid Arterial Duplex Study Indications:       Syncope. Comparison Study:  No prior study. Performing Technologist: Maudry Mayhew MHA, RDMS, RVT, RDCS  Examination Guidelines: A complete evaluation includes B-mode imaging, spectral Doppler, color Doppler, and power Doppler as needed of all accessible portions of each vessel. Bilateral testing is considered an integral part of a complete examination. Limited examinations for reoccurring indications may be performed as noted.  Right Carotid Findings: +----------+-------+-------+--------+---------------------------------+--------+           PSV    EDV    StenosisPlaque Description               Comments           cm/s   cm/s                                                     +----------+-------+-------+--------+---------------------------------+--------+ CCA Prox  82  11                                                       +----------+-------+-------+--------+---------------------------------+--------+ CCA Distal55     10                                                        +----------+-------+-------+--------+---------------------------------+--------+ ICA Prox  36     10             heterogenous, irregular and                                               calcific                                  +----------+-------+-------+--------+---------------------------------+--------+ ICA Distal84     30                                                       +----------+-------+-------+--------+---------------------------------+--------+ ECA       55     12             smooth and heterogenous                   +----------+-------+-------+--------+---------------------------------+--------+ +----------+--------+-------+----------------+-------------------+           PSV cm/sEDV cmsDescribe        Arm Pressure (mmHG) +----------+--------+-------+----------------+-------------------+ [id number]             Multiphasic, WNL                    +----------+--------+-------+----------------+-------------------+ +---------+--------+--+--------+-+---------+ VertebralPSV cm/s25EDV cm/s8Antegrade +---------+--------+--+--------+-+---------+  Left Carotid Findings: +----------+--------+--------+--------+--------------------------+--------+           PSV cm/sEDV cm/sStenosisPlaque Description        Comments +----------+--------+--------+--------+--------------------------+--------+ CCA Prox  79      18              heterogenous and smooth            +----------+--------+--------+--------+--------------------------+--------+ CCA Distal54      13              heterogenous and irregular         +----------+--------+--------+--------+--------------------------+--------+ ICA Prox  47      18              heterogenous and irregular         +----------+--------+--------+--------+--------------------------+--------+ ICA Distal73      33                                                  +----------+--------+--------+--------+--------------------------+--------+ ECA       83  22              smooth and heterogenous            +----------+--------+--------+--------+--------------------------+--------+ +----------+--------+--------+--------------+-------------------+           PSV cm/sEDV cm/sDescribe      Arm Pressure (mmHG) +----------+--------+--------+--------------+-------------------+ Subclavian                Not identified                    +----------+--------+--------+--------------+-------------------+ +---------+--------+--+--------+--+---------+ VertebralPSV cm/s32EDV cm/s11Antegrade +---------+--------+--+--------+--+---------+   Summary: Right Carotid: Velocities in the right ICA are consistent with a 1-39% stenosis. Left Carotid: Velocities in the left ICA are consistent with a 1-39% stenosis. Vertebrals:  Bilateral vertebral arteries demonstrate antegrade flow. Subclavians: Left subclavian artery was not visualized. Normal flow hemodynamics              were seen in the right subclavian artery. *See table(s) above for measurements and observations.     Preliminary     Cardiac Studies   Heart cath 01/14/15: Final Conclusions:  1. Severe 2 vessel obstructive CAD 2. Severe LV dysfunction. 3. Normal right heart pressures. 4. Successful stenting of the first diagonal with a DES 5. Successful stenting of the first OM with a DES.  Recommendations: Continue DAPT for one year. If patient unable to afford Effient would need to load with Plavix. Continue medical therapy for LV dysfunction. Mitral insufficiency does not appear to be hemodynamically significant. I would treat his LAD disease medically unless he has refractory angina. It is fairly diffuse.    TEE 12/03/16 Study Conclusions  - Left ventricle: Systolic function was severely reduced. The estimated ejection fraction was in the range of 25% to 30%. Diffuse hypokinesis. - Aortic  valve: Structurally normal valve. Trileaflet; normal thickness leaflets. There was no significant regurgitation. - Aorta: There was moderate non-mobile atheroma. - Ascending aorta: The ascending aorta was mildly dilated measuring 40 mm. - Descending aorta: The descending aorta was normal in size. - Mitral valve: There was mild to moderate regurgitation. No paravalvular leak. - Left atrium: The atrium was dilated. No evidence of thrombus in the atrial cavity or appendage. No evidence of thrombus in the atrial cavity or appendage. No evidence of thrombus in the appendage. - Right ventricle: Systolic function was mildly reduced. - Right atrium: No evidence of thrombus in the atrial cavity or appendage. - Atrial septum: No defect or patent foramen ovale was identified. - Tricuspid valve: There was mild regurgitation. - Pericardium, extracardiac: There was no pericardial effusion.  Device Interrogation 01/04/20: Battery 2.4-2.8 years A/V/shock leads stable VT 141- monitor VF 187- ATP + shock >99% Afib burden No VT/VF episodes Several episodes of SVT.  Rate 140s-160.  Duration <34 seconds  Patient Profile     69 y.o. male with CAD status post multiple PCI's, chronic systolic and diastolic heart failure, status post [name] ICD, status post mitral valve repair, persistent atrial fibrillation, DM, CKD III, admitted with syncope and chest pain.  Assessment & Plan    # Chronic systolic and diastolic heart failure: Feeling a little short of breath laying flat in bed.  Increase lasix to 40mg  bid.  He was admitted with diarrhea and likely intravascular volume depletion.His home regimen of cardiac medications is clear.  He was off many medicines due to being on hospice.  Blood pressure is currently well-controlled.   Switch metoprolol to succinate.  If his blood pressure allows consider adding Entresto.  Echo pending.    # Syncope: Likely due to intravascular volume depletion  as above.  No events noted on device interrogation.   # Chest pain:  Hs troponin peaked at 149.  He had chest pain, but this was in the setting of being off many of his medications and not having insulin for months.  We will get him back on a stable regimen and if he continues to have chest pain consider cardiac catheterization versus stress testing.  # Persistent atrial fibrillation: Unclear whether he was taking amiodarone on admission.  Rate well-controlled on metoprolol.  His device did show an old episode of VT.  Resume amiodarone 200mg  daily. Resume warfarin.  # DM: He was not receiving insulin while on hospice.  He would be a good candidate for an SGLT2 inhibitor.  Appreciate internal medicine assistance.  # CKD 3: Renal function improving.  Home valsartan on hold.        For questions or updates, please contact [place] Please consult [email or web address] for contact info under        Signed, Skeet Latch, MD  01/06/2020, 8:25 AM

## 2020-01-06 NOTE — Progress Notes (Signed)
ANTICOAGULATION CONSULT NOTE - Follow Up Consult  Pharmacy Consult for heparin Indication: chest pain/ACS  Labs: Recent Labs    01/04/20 1641 01/04/20 1641 01/04/20 1642 01/04/20 1642 01/04/20 1655 01/04/20 1655 01/04/20 1841 01/04/20 2147 01/05/20 0026 01/05/20 0712 01/05/20 1619 01/06/20 0532  HGB  --   --  14.4   < > 14.6   < >  --   --   --  14.0  --  14.3  HCT  --   --  42.0   < > 43.0  --   --   --   --  40.8  --  42.5  PLT  --   --  202  --   --   --   --   --   --  174  --  204  LABPROT 16.6*  --   --   --   --   --   --   --   --   --   --   --   INR 1.4*  --   --   --   --   --   --   --   --   --   --   --   HEPARINUNFRC  --   --   --   --   --   --   --   --   --  0.36 0.41 0.15*  CREATININE  --   --  1.45*  --   --   --   --   --  1.34* 1.25*  --   --   TROPONINIHS 68*   < >  --   --   --   --  100* 149*  --  100*  --   --    < > = values in this interval not displayed.    Assessment/Plan:  69yo male subtherapeutic on heparin though RN reported that the IV site had started bleeding rather heavily (soaked through blanket); heparin infusion was paused just prior to lab draw and should not have affected lab. RN reports that bleeding stopped with paused infusion and now new IV site has been established. Will resume gtt at prior rate and check additional level.   Wynona Neat, PharmD, BCPS  01/06/2020,6:51 AM

## 2020-01-06 NOTE — Progress Notes (Signed)
  Echocardiogram 2D Echocardiogram has been performed.  Jennette Dubin 01/06/2020, 11:56 AM

## 2020-01-06 NOTE — Progress Notes (Signed)
PROGRESS NOTE    [name] [name]  VQX:[id number] DOB: 1951/11/05 DOA: 01/04/2020 PCP: [name], PA    Brief Narrative: HPI per Dr. Keene Breath Gustafson  is a 69 y.o. male,  w hypertension, hyperlipidemia, Dm2, CKD stage3, CAD s/p BMS to LAD 2000, PTCA 2nd diag 2010, DES LAD and DES OM1 2016, Mitral valve prolapse/ moderate MR, h/o MVR 2004, CHF (EF 25-30%) s/p AICD, presents with dyspnea for the past 4 days, along with chest pain for the past 2 days. Substernal "sharp" with radiation to the left shoulder/ arm,  Fairly constant.  Pt denies headache, alteration in sense of taste or smell, fever, chills, cough, palp, n/v, diarrhea, brbpr, black stool, dysuria, hematuria.  Pt noted ?syncope to ED physician  Pt was on hospice but was having difficulty getting his medication and presented to ED for evaluation    In ED,  T 97.5, P 94, R 16, Bp 143/98  Pox 98% on RA Wt 108.9kg   CT brain  IMPRESSION: 1. No acute intracranial hemorrhage. 2. No acute/traumatic cervical spine pathology.  CXR IMPRESSION: 1. Evidence of prior median sternotomy. 2. Mild right basilar atelectasis and/or infiltrate. 3. Stable cardiomegaly.   Trop 68->  100-> 149  Wbc 7.7, Hgb 14.4, Plt 202 Na 124, K 4.5, Bun 23, Creatinine 1.45 ASt 25, Alt 23 Glucose 627 Hco3 20, AG 13 BNP 165.2 INR 1.4  FOBT negative Urinalysis negative   ED consulted cardiology and they will follow, no STEMI per ED/ Cardiology   Pt will be admitted for chest pain ? Syncope and hyperglycemia    Assessment & Plan:   Principal Problem:   Chest pain Active Problems:   CAD (coronary artery disease)   Syncope   DM (diabetes mellitus), type 2 (HCC)   Diarrhea   Abdominal pain   Chronic combined systolic and diastolic heart failure (HCC)   Benign prostatic hyperplasia  1 chest pain Patient presented with chest pain noted to have elevated troponins as high as 149.  Patient noted to have been on  hospice and after discharge from hospice not on any of his cardiac medications since then.  2D echo pending.  Continue aspirin, Lipitor, oral Lasix, metoprolol, IV heparin.  Cardiology following and appreciate input and recommendations.  2.?? Syncope Per admitting physician ED physician did state patient had a syncopal episode.  Patient also noted to have had multiple episodes of diarrhea prior to admission.  Concern for possible volume depletion.  Patient not been on any medications since discharge from hospice.  High-sensitivity troponins were rising/elevated.  Patient started on a heparin drip per cardiology.  St. [name] device to be interrogated per cardiology.  2D echo pending.  Per cardiology.  3.  Chronic combined systolic and diastolic heart failure Patient with some complaints of some shortness of breath he had overnight with some chest tightness.  Patient does not look volume overloaded on examination.  Patient noted have been off his cardiac medications after being placed in hospice.  On admission patient noted to, bouts of diarrhea and likely volume depleted.  2D echo pending.  Continue current cardiac medications of Lipitor, aspirin, metoprolol.  Patient is Lasix has been increased to 40 mg twice daily per cardiology.  Cardiology following and appreciate input and recommendations.  4.  Diarrhea C. difficile PCR pending.  Patient with no bouts of diarrhea over the past 2 days.  Supposedly C. difficile PCR has been sent however no results noted.  Will discontinue contact precautions.  Supportive care.  Follow.   5.  Persistent atrial fibrillation Patient currently in A. fib and irregularly irregular.  Continue metoprolol for rate control.  Patient currently on IV heparin.  2D echo pending.  Cardiology following and have recommended patient's amiodarone to be resumed and patient to be started back on Coumadin. Per cardiology.   6.  Poorly controlled diabetes mellitus type 2 Hemoglobin A1c  11.0.  CBG of 216 this morning.  Patient noted to have initially been on hospice however no longer on hospice and since then has not received any of his medications or been able to fill them including his insulin.  Oral hypoglycemic agents on hold.  Increase Lantus to 30 units daily.  Continue sliding scale insulin.  Consult with diabetic coordinator.  7.  Gastroesophageal reflux disease PPI.  8.  BPH Flomax.   9.  Anxiety Xanax as needed.   DVT prophylaxis: Heparin Code Status: Full Family Communication: Updated patient.  No family at bedside. Disposition Plan:   Patient came from: Home             Anticipated d/c place: Home  Barriers to d/c OR conditions which need to be met to effect a safe d/c: Clinical improvement and clearance by cardiology.   Consultants:   Cardiology: Dr. Gardiner Rhyme 01/05/2020  Procedures:  CT head CT/ C-spine 01/04/2020  Abdominal films pending 01/05/2020  Chest x-ray 01/04/2020  Carotid ultrasound 01/05/2020  2D echo pending  Antimicrobials:   None   Subjective: Patient sleeping.  Arousable.  Complains of some shortness of breath overnight.  Patient with some complaints of some chest tightness.  Denies any abdominal pain.  No diarrhea.  Denies any abdominal pain.  Objective: Vitals:   01/06/20 0022 01/06/20 0500 01/06/20 0527 01/06/20 0751  BP: 125/90  117/74 (!) 120/93  Pulse: 84  71 88  Resp: 18  18 17   Temp:   98 F (36.7 C) 98.6 F (37 C)  TempSrc:   Oral Oral  SpO2: 100%  99% 100%  Weight:  104.8 kg    Height:        Intake/Output Summary (Last 24 hours) at 01/06/2020 1001 Last data filed at 01/06/2020 0841 Gross per 24 hour  Intake 970 ml  Output 1775 ml  Net -805 ml   Filed Weights   01/04/20 1630 01/05/20 0638 01/06/20 0500  Weight: 108.9 kg 105.7 kg 104.8 kg    Examination:  General exam: NAD Respiratory system: Decreased breath sounds in the bases.  No wheezing.  Speaking in full sentences.  Normal respiratory  effort.  Cardiovascular system: Irregularly irregular.  No JVD, no murmurs, no rubs, no gallops.  No lower extremity edema.  Gastrointestinal system: Soft, nontender, nondistended, positive bowel sounds.  No rebound.  No guarding. Central nervous system: Alert and oriented. No focal neurological deficits. Extremities: Symmetric 5 x 5 power. Skin: No rashes, lesions or ulcers Psychiatry: Judgement and insight appear normal. Mood & affect appropriate.     Data Reviewed: I have personally reviewed following labs and imaging studies  CBC: Recent Labs  Lab 01/04/20 1642 01/04/20 1655 01/05/20 0712 01/06/20 0532  WBC 7.7  --  5.8 5.6  NEUTROABS 6.3  --   --   --   HGB 14.4 14.6 14.0 14.3  HCT 42.0 43.0 40.8 42.5  MCV 90.5  --  89.5 90.6  PLT 202  --  174 938   Basic Metabolic Panel: Recent Labs  Lab 01/04/20 1642 01/04/20 1655 01/05/20 0026  01/05/20 0712 01/06/20 0532  NA 124* 128* 134* 136 138  K 4.5 4.6 3.7 3.7 4.3  CL 91*  --  102 105 104  CO2 20*  --  23 20* 25  GLUCOSE 627*  --  279* 214* 234*  BUN 23  --  21 20 18   CREATININE 1.45*  --  1.34* 1.25* 1.20  CALCIUM 8.9  --  8.9 8.8* 9.2  MG  --   --   --   --  1.9   GFR: Estimated Creatinine Clearance: 71.7 mL/min (by C-G formula based on SCr of 1.2 mg/dL). Liver Function Tests: Recent Labs  Lab 01/04/20 1642 01/05/20 0712 01/06/20 0532  AST 25 22 23   ALT 23 21 22   ALKPHOS 87 83 81  BILITOT 1.3* 0.7 0.8  PROT 6.6 5.7* 6.1*  ALBUMIN 3.9 3.4* 3.5   Recent Labs  Lab 01/05/20 0712  LIPASE 22   No results for input(s): AMMONIA in the last 168 hours. Coagulation Profile: Recent Labs  Lab 01/04/20 1641  INR 1.4*   Cardiac Enzymes: No results for input(s): CKTOTAL, CKMB, CKMBINDEX, TROPONINI in the last 168 hours. BNP (last 3 results) No results for input(s): PROBNP in the last 8760 hours. HbA1C: Recent Labs    01/06/20 0532  HGBA1C 11.0*   CBG: Recent Labs  Lab 01/05/20 1626 01/05/20 2059  01/06/20 0041 01/06/20 0507 01/06/20 0743  GLUCAP 335* 302* 266* 216* 154*   Lipid Profile: Recent Labs    01/06/20 0532  CHOL 124  HDL 32*  LDLCALC 63  TRIG 147  CHOLHDL 3.9   Thyroid Function Tests: No results for input(s): TSH, T4TOTAL, FREET4, T3FREE, THYROIDAB in the last 72 hours. Anemia Panel: No results for input(s): VITAMINB12, FOLATE, FERRITIN, TIBC, IRON, RETICCTPCT in the last 72 hours. Sepsis Labs: No results for input(s): PROCALCITON, LATICACIDVEN in the last 168 hours.  Recent Results (from the past 240 hour(s))  SARS CORONAVIRUS 2 (TAT 6-24 HRS) Nasopharyngeal Nasopharyngeal Swab     Status: None   Collection Time: 01/04/20  8:40 PM   Specimen: Nasopharyngeal Swab  Result Value Ref Range Status   SARS Coronavirus 2 NEGATIVE NEGATIVE Final    Comment: (NOTE) SARS-CoV-2 target nucleic acids are NOT DETECTED. The SARS-CoV-2 RNA is generally detectable in upper and lower respiratory specimens during the acute phase of infection. Negative results do not preclude SARS-CoV-2 infection, do not rule out co-infections with other pathogens, and should not be used as the sole basis for treatment or other patient management decisions. Negative results must be combined with clinical observations, patient history, and epidemiological information. The expected result is Negative. Fact Sheet for Patients: SugarRoll.be Fact Sheet for Healthcare Providers: https://www.woods-mathews.com/ This test is not yet approved or cleared by the [place] FDA and  has been authorized for detection and/or diagnosis of SARS-CoV-2 by FDA under an Emergency Use Authorization (EUA). This EUA will remain  in effect (meaning this test can be used) for the duration of the COVID-19 declaration under Section 56 4(b)(1) of the Act, 21 U.S.C. section 360bbb-3(b)(1), unless the authorization is terminated or revoked sooner. Performed at [facility], [place] [address] [place], [place] [address]          Radiology Studies: CT Head Wo Contrast  Result Date: 01/04/2020 CLINICAL DATA:  69 year old male with head trauma. EXAM: CT HEAD WITHOUT CONTRAST CT CERVICAL SPINE WITHOUT CONTRAST TECHNIQUE: Multidetector CT imaging of the head and cervical spine was performed following the  standard protocol without intravenous contrast. Multiplanar CT image reconstructions of the cervical spine were also generated. COMPARISON:  Head CT dated 09/27/2016. FINDINGS: CT HEAD FINDINGS Brain: There is mild age-related atrophy and chronic microvascular ischemic changes. There is no acute intracranial hemorrhage. No mass effect or shift no extra-axial fluid collection. Vascular: No hyperdense vessel or unexpected calcification. Skull: Normal. Negative for fracture or focal lesion. Sinuses/Orbits: Mild mucoperiosteal thickening of paranasal sinuses. No air-fluid level. The mastoid air cells are clear. Other: None CT CERVICAL SPINE FINDINGS Alignment: No acute subluxation. Skull base and vertebrae: No acute fracture.  Osteopenia. Soft tissues and spinal canal: No prevertebral fluid or swelling. No visible canal hematoma. Disc levels: No acute findings. C5-C6 ankylosis. Degenerative changes primarily at C6-C7 with endplate irregularity and disc space narrowing and spurring. There is narrowing the left C6-C7 neural foramina. Upper chest: Negative. Other: Bilateral carotid bulb calcified plaques. IMPRESSION: 1. No acute intracranial hemorrhage. 2. No acute/traumatic cervical spine pathology. Electronically Signed   By: Anner Crete M.D.   On: 01/04/2020 21:13   CT Cervical Spine Wo Contrast  Result Date: 01/04/2020 CLINICAL DATA:  69 year old male with head trauma. EXAM: CT HEAD WITHOUT CONTRAST CT CERVICAL SPINE WITHOUT CONTRAST TECHNIQUE: Multidetector CT imaging of the head and cervical spine was performed following the standard protocol without intravenous  contrast. Multiplanar CT image reconstructions of the cervical spine were also generated. COMPARISON:  Head CT dated 09/27/2016. FINDINGS: CT HEAD FINDINGS Brain: There is mild age-related atrophy and chronic microvascular ischemic changes. There is no acute intracranial hemorrhage. No mass effect or shift no extra-axial fluid collection. Vascular: No hyperdense vessel or unexpected calcification. Skull: Normal. Negative for fracture or focal lesion. Sinuses/Orbits: Mild mucoperiosteal thickening of paranasal sinuses. No air-fluid level. The mastoid air cells are clear. Other: None CT CERVICAL SPINE FINDINGS Alignment: No acute subluxation. Skull base and vertebrae: No acute fracture.  Osteopenia. Soft tissues and spinal canal: No prevertebral fluid or swelling. No visible canal hematoma. Disc levels: No acute findings. C5-C6 ankylosis. Degenerative changes primarily at C6-C7 with endplate irregularity and disc space narrowing and spurring. There is narrowing the left C6-C7 neural foramina. Upper chest: Negative. Other: Bilateral carotid bulb calcified plaques. IMPRESSION: 1. No acute intracranial hemorrhage. 2. No acute/traumatic cervical spine pathology. Electronically Signed   By: Anner Crete M.D.   On: 01/04/2020 21:13   DG Chest Portable 1 View  Result Date: 01/04/2020 CLINICAL DATA:  Chest pain. EXAM: PORTABLE CHEST 1 VIEW COMPARISON:  March 03, 2018 FINDINGS: There is a dual lead AICD. Multiple sternal wires are noted. Numerous overlying cardiac lead wires are seen. Mild atelectasis and/or infiltrate is seen within the right lung base. There is no evidence of a pleural effusion or pneumothorax. There is moderate to marked severity are gin of the cardiac silhouette. There is mild calcification of the aortic arch. The visualized skeletal structures are unremarkable. IMPRESSION: 1. Evidence of prior median sternotomy. 2. Mild right basilar atelectasis and/or infiltrate. 3. Stable cardiomegaly.  Electronically Signed   By: Virgina Norfolk M.D.   On: 01/04/2020 17:51   DG Abd 2 Views  Result Date: 01/05/2020 CLINICAL DATA:  Abdominal pain EXAM: ABDOMEN - 2 VIEW COMPARISON:  None. FINDINGS: Overall bowel gas pattern is nonobstructive. No evidence of soft tissue mass or abnormal fluid collection. No evidence of free intraperitoneal air. No evidence of renal or ureteral calculi. Vascular calcifications noted in the pelvis. Lung bases appear clear. Visualized osseous structures are unremarkable. IMPRESSION: Nonobstructive bowel gas  pattern and no evidence of acute intra-abdominal abnormality. Electronically Signed   By: Franki Cabot M.D.   On: 01/05/2020 12:32   VAS US CAROTID  Result Date: 01/05/2020 Carotid Arterial Duplex Study Indications:       Syncope. Comparison Study:  No prior study. Performing Technologist: Maudry Mayhew MHA, RDMS, RVT, RDCS  Examination Guidelines: A complete evaluation includes B-mode imaging, spectral Doppler, color Doppler, and power Doppler as needed of all accessible portions of each vessel. Bilateral testing is considered an integral part of a complete examination. Limited examinations for reoccurring indications may be performed as noted.  Right Carotid Findings: +----------+-------+-------+--------+---------------------------------+--------+             PSV     EDV     Stenosis Plaque Description                Comments              cm/s    cm/s                                                         +----------+-------+-------+--------+---------------------------------+--------+  CCA Prox   82      11                                                           +----------+-------+-------+--------+---------------------------------+--------+  CCA Distal 55      10                                                           +----------+-------+-------+--------+---------------------------------+--------+  ICA Prox   36      10               heterogenous, irregular and                                                       calcific                                    +----------+-------+-------+--------+---------------------------------+--------+  ICA Distal 84      30                                                           +----------+-------+-------+--------+---------------------------------+--------+  ECA        55      12               smooth and heterogenous                     +----------+-------+-------+--------+---------------------------------+--------+ +----------+--------+-------+----------------+-------------------+  PSV cm/s EDV cms Describe         Arm Pressure (mmHG)  +----------+--------+-------+----------------+-------------------+  Subclavian 60               Multiphasic, WNL                      +----------+--------+-------+----------------+-------------------+ +---------+--------+--+--------+-+---------+  Vertebral PSV cm/s 25 EDV cm/s 8 Antegrade  +---------+--------+--+--------+-+---------+  Left Carotid Findings: +----------+--------+--------+--------+--------------------------+--------+             PSV cm/s EDV cm/s Stenosis Plaque Description         Comments  +----------+--------+--------+--------+--------------------------+--------+  CCA Prox   79       18                heterogenous and smooth              +----------+--------+--------+--------+--------------------------+--------+  CCA Distal 54       13                heterogenous and irregular           +----------+--------+--------+--------+--------------------------+--------+  ICA Prox   47       18                heterogenous and irregular           +----------+--------+--------+--------+--------------------------+--------+  ICA Distal 73       33                                                     +----------+--------+--------+--------+--------------------------+--------+  ECA        83       22                smooth and heterogenous               +----------+--------+--------+--------+--------------------------+--------+ +----------+--------+--------+--------------+-------------------+             PSV cm/s EDV cm/s Describe       Arm Pressure (mmHG)  +----------+--------+--------+--------------+-------------------+  Subclavian                   Not identified                      +----------+--------+--------+--------------+-------------------+ +---------+--------+--+--------+--+---------+  Vertebral PSV cm/s 32 EDV cm/s 11 Antegrade  +---------+--------+--+--------+--+---------+   Summary: Right Carotid: Velocities in the right ICA are consistent with a 1-39% stenosis. Left Carotid: Velocities in the left ICA are consistent with a 1-39% stenosis. Vertebrals:  Bilateral vertebral arteries demonstrate antegrade flow. Subclavians: Left subclavian artery was not visualized. Normal flow hemodynamics              were seen in the right subclavian artery. *See table(s) above for measurements and observations.     Preliminary         Scheduled Meds:  amiodarone  200 mg Oral Daily   aspirin EC  81 mg Oral Daily   atorvastatin  20 mg Oral q1800   furosemide  40 mg Oral BID   insulin aspart  0-9 Units Subcutaneous Q4H   insulin glargine  10 Units Subcutaneous Once   [START ON 01/07/2020] insulin glargine  30 Units Subcutaneous Daily   metoprolol succinate  25 mg Oral Daily   pantoprazole  40 mg Oral Daily  sodium chloride flush  3 mL Intravenous Q12H   tamsulosin  0.4 mg Oral Daily   Continuous Infusions:  sodium chloride     heparin 1,300 Units/hr (01/06/20 0656)     LOS: 2 days    Time spent: 40 minutes    Irine Seal, MD [facility]   To contact the attending provider between 7A-7P or the covering provider during after hours 7P-7A, please log into the web site [email or web address] and access using universal [place] password for that web site. If you do not have the password, please call the hospital  operator.  01/06/2020, 10:01 AM

## 2020-01-06 NOTE — Evaluation (Signed)
Physical Therapy Evaluation Patient Details Name: [name] [name] MRN: [id number] DOB: [birth year] Today's Date: 01/06/2020   History of Present Illness  Mancel Kintzel  is a 69 y.o. male,  w hypertension, hyperlipidemia, Dm2, CKD stage3, CAD s/p BMS to LAD 2000, PTCA 2nd diag 2010, DES LAD and DES OM1 2016, Mitral valve prolapse/ moderate MR, h/o MVR 2004, CHF (EF 25-30%) s/p AICD, presents with dyspnea for the past 4 days, along with chest pain for the past 2 days. Substernal "sharp" with radiation to the left shoulder/ arm,  Fairly constant.  Clinical Impression  Pt admitted with above diagnosis.  Pt currently with functional limitations due to the deficits listed below (see PT Problem List). Pt will benefit from skilled PT to increase their independence and safety with mobility to allow discharge to the venue listed below.  Pt ambulated with RW with heavy reliance on it. He states he will not use one at home and his step-daughter has been telling him he needs one and it will cause fights between them if PT recommends one. He was unable to clear his foot while doing march-in-place holding onto just the IV pole.  He reports difficulty with showering and holds onto shower head to help him stand.  Recommended a 3-1 BSC or shower chair and he was not receptive to this either.  Noted that pt does already have OT orders.  Recommend RW and 3-1 BSC which he may refuse, and HHPT which pt is open to.     Follow Up Recommendations Home health PT    Equipment Recommendations  Rolling walker with 5" wheels;3in1 (PT)(pt may refuse)    Recommendations for Other Services       Precautions / Restrictions Precautions Precautions: Fall Restrictions Weight Bearing Restrictions: No      Mobility  Bed Mobility Overal bed mobility: Modified Independent             General bed mobility comments: HOB slightly elevated, use of rail  Transfers Overall transfer level: Needs assistance Equipment  used: Rolling walker (2 wheeled) Transfers: Sit to/from Stand Sit to Stand: Min guard         General transfer comment: min/guard for steadying  Ambulation/Gait Ambulation/Gait assistance: Min guard Gait Distance (Feet): 40 Feet Assistive device: Rolling walker (2 wheeled) Gait Pattern/deviations: Step-through pattern;Decreased step length - right;Decreased step length - left Gait velocity: decreased   General Gait Details: Pt appeared to be heavily relying on use of RW, despite saying he didn't need it.  After gait, had him march in place with just IV pole and he was unable to clear feet off of floor.  Stairs            Wheelchair Mobility    Modified Rankin (Stroke Patients Only)       Balance Overall balance assessment: Needs assistance   Sitting balance-Leahy Scale: Good       Standing balance-Leahy Scale: Fair Standing balance comment: fair static, poor dynamic                             Pertinent Vitals/Pain Pain Assessment: Faces Faces Pain Scale: Hurts a little bit Pain Location: "bone on bone pain from years of playing hockey"    [place] expects to be discharged to:: Private residence Living Arrangements: Children(step-daughter) Available Help at Discharge: Family;Available PRN/intermittently Type of Home: House Home Access: Stairs to enter Entrance Stairs-Rails: Left Entrance Stairs-Number of Steps: 3  Home Layout: One level Home Equipment: Cane - single point Additional Comments: He reports he holds onto shower head for balance.    Prior Function Level of Independence: Independent with assistive device(s)         Comments: amb with cane     Hand Dominance        Extremity/Trunk Assessment   Upper Extremity Assessment Upper Extremity Assessment: Defer to OT evaluation    Lower Extremity Assessment Lower Extremity Assessment: Overall WFL for tasks assessed;Generalized weakness        Communication      Cognition Arousal/Alertness: Awake/alert Behavior During Therapy: WFL for tasks assessed/performed Overall Cognitive Status: Within Functional Limits for tasks assessed Area of Impairment: Safety/judgement                         Safety/Judgement: Decreased awareness of safety     General Comments: Pt reported issues at home with showering or feelings of weakness. When PT explained DME that could be used to help with this, pt didn't seem receptive.      General Comments      Exercises     Assessment/Plan    PT Assessment Patient needs continued PT services  PT Problem List Decreased strength;Decreased activity tolerance;Decreased balance;Decreased mobility;Decreased knowledge of use of DME;Decreased safety awareness       PT Treatment Interventions DME instruction;Gait training;Stair training;Functional mobility training;Therapeutic exercise;Therapeutic activities;Patient/family education;Neuromuscular re-education    PT Goals (Current goals can be found in the Care Plan section)  Acute Rehab PT Goals Patient Stated Goal: home using cane PT Goal Formulation: With patient Time For Goal Achievement: 01/20/20 Potential to Achieve Goals: Good    Frequency Min 3X/week   Barriers to discharge        Co-evaluation               AM-PAC PT "6 Clicks" Mobility  Outcome Measure Help needed turning from your back to your side while in a flat bed without using bedrails?: None Help needed moving from lying on your back to sitting on the side of a flat bed without using bedrails?: None Help needed moving to and from a bed to a chair (including a wheelchair)?: A Little Help needed standing up from a chair using your arms (e.g., wheelchair or bedside chair)?: A Little Help needed to walk in hospital room?: A Little Help needed climbing 3-5 steps with a railing? : A Little 6 Click Score: 20    End of Session Equipment Utilized During  Treatment: Gait belt Activity Tolerance: Patient tolerated treatment well Patient left: in chair;with call bell/phone within reach Nurse Communication: Mobility status PT Visit Diagnosis: Unsteadiness on feet (R26.81);Difficulty in walking, not elsewhere classified (R26.2)    Time: [id number] PT Time Calculation (min) (ACUTE ONLY): 28 min   Charges:   PT Evaluation $PT Eval Moderate Complexity: 1 Mod PT Treatments $Gait Training: 8-22 mins        Naysha Sholl L. Tamala Julian, [place] Pager [phone] 01/06/2020   Galen Manila 01/06/2020, 11:47 AM

## 2020-01-06 NOTE — Progress Notes (Signed)
ANTICOAGULATION CONSULT NOTE - Consult  Pharmacy Consult for heparin Indication: chest pain/ACS  Patient Measurements: Height: 6' (182.9 cm) Weight: 231 lb 1.6 oz (104.8 kg) IBW/kg (Calculated) : 77.6 Heparin Dosing Weight: 100 kg  Vital Signs: Temp: 98.6 F (37 C) (01/24 0751) Temp Source: Oral (01/24 0751) BP: 120/93 (01/24 0751) Pulse Rate: 88 (01/24 0751)  Labs: Recent Labs    01/04/20 1641 01/04/20 1641 01/04/20 1642 01/04/20 1642 01/04/20 1655 01/04/20 1655 01/04/20 1841 01/04/20 2147 01/05/20 0026 01/05/20 0712 01/05/20 1619 01/06/20 0532 01/06/20 1038  HGB  --   --  14.4   < > 14.6   < >  --   --   --  14.0  --  14.3  --   HCT  --   --  42.0   < > 43.0  --   --   --   --  40.8  --  42.5  --   PLT  --   --  202  --   --   --   --   --   --  174  --  204  --   LABPROT 16.6*  --   --   --   --   --   --   --   --   --   --   --  15.9*  INR 1.4*  --   --   --   --   --   --   --   --   --   --   --  1.3*  HEPARINUNFRC  --   --   --   --   --   --   --   --   --  0.36 0.41 0.15*  --   CREATININE  --   --  1.45*   < >  --   --   --   --  1.34* 1.25*  --  1.20  --   TROPONINIHS 68*   < >  --   --   --   --  100* 149*  --  100*  --   --   --    < > = values in this interval not displayed.      Medical History: Past Medical History:  Diagnosis Date  . Anxiety   . Arthritis   . CAD (coronary artery disease)    a.  1993 s/p MI - [facility];  b. s/p BMS to LAD '00;  c. PTCA 2nd diagonal 2010;  d. 02/18/12 Cath: moderate nonobs dzs - med rx;  e.  01/2015 Cath: LM nl, LAD 40-65m ISR, 70-9m/d, d1 90p (3.0x16 Synergy DES), D2 50-60, LCX nl, OM1 50p, 20m (2.5x12 Synergy DES), RCA nl, EF 30-35%.  . Chronic combined systolic and diastolic CHF (congestive heart failure) ([place])    a. 12/2014 Echo: EF 30-35%, Gr2 DD, mod MR, sev dil LA.  . CKD (chronic kidney disease), stage III ([place])   . Depression   . ED (erectile dysfunction)   . GERD (gastroesophageal reflux  disease)   . Hyperlipidemia   . Hypertension   . Ischemic cardiomyopathy    a. s/p St. [name] (Atlas) ICD implanted in [place] 2007;  b. 12/2014 Echo: Ef 30-35%.  . MVP (mitral valve prolapse)    a. s/p MV annuloplasty at [facility] 2004.  Marland Kitchen Persistent atrial fibrillation ([place])    a. noted on ICD interrogation '10 - not previously on [facility] -  CHA2DS2VASc = 5.  . Type II diabetes mellitus ([place])    uncontrolled     Assessment: 46 yom with history of AFib, on warfarin at home, INR on admission 1.4. PTA dose: 1.5 mg on Mon/Fri, 2 mg on all other days. Patient found to have elevated troponins, peaked at 149. Pharmacy to dose heparin.   Patient was noted to have some bleeding yesterday which has now resolved as heparin was held and then restarted. Patient remains on heparin infusion for a bridge, while pharmacy now re-starts warfarin. The 8 hour heparin level after the pause in infusion rate is subtherapeutic at 0.28 on 1300 units/hour. Patient's INR today is subtherapeutic at 1.3. H&H is stable at 14.3/42.5, plts are wnl at 204. Renal function is relatively stable. Of note, amiodarone may increase the therapeutic effects of warfarin.    Goal of Therapy:  INR 2-3 Heparin level 0.3-0.7 units/ml Monitor platelets by anticoagulation protocol: Yes   Plan:  -Warfarin 5 mg x 1 tonight  -Small bolus of heparin with 1000 units -Increase heparin infusion to 1500 units/hr -Daily HL, CBC, and INR -Monitor for signs of bleeding     Thank you,   Eddie Candle, PharmD PGY-1 Pharmacy Resident   Please check amion for clinical pharmacist contact number 01/06/2020,11:46 AM

## 2020-01-07 DIAGNOSIS — I5043 Acute on chronic combined systolic (congestive) and diastolic (congestive) heart failure: Secondary | ICD-10-CM

## 2020-01-07 DIAGNOSIS — I4821 Permanent atrial fibrillation: Secondary | ICD-10-CM

## 2020-01-07 LAB — GLUCOSE, CAPILLARY
Glucose-Capillary: 255 mg/dL — ABNORMAL HIGH (ref 70–99)
Glucose-Capillary: 209 mg/dL — ABNORMAL HIGH (ref 70–99)
Glucose-Capillary: 251 mg/dL — ABNORMAL HIGH (ref 70–99)
Glucose-Capillary: 308 mg/dL — ABNORMAL HIGH (ref 70–99)
Glucose-Capillary: 248 mg/dL — ABNORMAL HIGH (ref 70–99)

## 2020-01-07 LAB — BASIC METABOLIC PANEL
Anion gap: 10 (ref 5–15)
BUN: 17 mg/dL (ref 8–23)
CO2: 24 mmol/L (ref 22–32)
Calcium: 8.8 mg/dL — ABNORMAL LOW (ref 8.9–10.3)
Chloride: 102 mmol/L (ref 98–111)
Creatinine, Ser: 1.59 mg/dL — ABNORMAL HIGH (ref 0.61–1.24)
GFR calc Af Amer: 50 mL/min — ABNORMAL LOW (ref >60)
GFR calc non Af Amer: 43 mL/min — ABNORMAL LOW (ref >60)
Glucose, Bld: 391 mg/dL — ABNORMAL HIGH (ref 70–99)
Potassium: 4.2 mmol/L (ref 3.5–5.1)
Sodium: 136 mmol/L (ref 135–145)

## 2020-01-07 LAB — PROTIME-INR
INR: 1.3 — ABNORMAL HIGH (ref 0.8–1.2)
Prothrombin Time: 16.5 seconds — ABNORMAL HIGH (ref 11.4–15.2)

## 2020-01-07 LAB — CBC
HCT: 40.2 % (ref 39.0–52.0)
Hemoglobin: 13.6 g/dL (ref 13.0–17.0)
MCH: 30.9 pg (ref 26.0–34.0)
MCHC: 33.8 g/dL (ref 30.0–36.0)
MCV: 91.4 fL (ref 80.0–100.0)
Platelets: 199 10*3/uL (ref 150–400)
RBC: 4.4 MIL/uL (ref 4.22–5.81)
RDW: 13.2 % (ref 11.5–15.5)
WBC: 5 10*3/uL (ref 4.0–10.5)
nRBC: 0 % (ref 0.0–0.2)

## 2020-01-07 LAB — MAGNESIUM: Magnesium: 1.8 mg/dL (ref 1.7–2.4)

## 2020-01-07 LAB — HEPARIN LEVEL (UNFRACTIONATED): Heparin Unfractionated: 0.6 IU/mL (ref 0.30–0.70)

## 2020-01-07 MED ORDER — ATORVASTATIN CALCIUM 40 MG PO TABS
40.0000 mg | ORAL_TABLET | Freq: Every day | ORAL | Status: DC
  Administered 2020-01-07: 40 mg via ORAL
  Filled 2020-01-07: qty 1

## 2020-01-07 MED ORDER — INSULIN GLARGINE 100 UNIT/ML ~~LOC~~ SOLN
35.0000 [IU] | Freq: Every day | SUBCUTANEOUS | Status: DC
  Administered 2020-01-07: 35 [IU] via SUBCUTANEOUS
  Filled 2020-01-07 (×2): qty 0.35

## 2020-01-07 MED ORDER — APIXABAN 5 MG PO TABS
5.0000 mg | ORAL_TABLET | Freq: Two times a day (BID) | ORAL | Status: DC
  Administered 2020-01-08: 5 mg via ORAL
  Filled 2020-01-07: qty 1

## 2020-01-07 MED ORDER — INSULIN ASPART 100 UNIT/ML ~~LOC~~ SOLN
6.0000 [IU] | Freq: Three times a day (TID) | SUBCUTANEOUS | Status: DC
  Administered 2020-01-07 – 2020-01-08 (×3): 6 [IU] via SUBCUTANEOUS

## 2020-01-07 NOTE — Progress Notes (Addendum)
ANTICOAGULATION CONSULT NOTE - Consult  Pharmacy Consult for heparin Indication: chest pain/ACS  Patient Measurements: Height: 6' (182.9 cm) Weight: 233 lb (105.7 kg) IBW/kg (Calculated) : 77.6 Heparin Dosing Weight: 100 kg  Vital Signs: Temp: 98 F (36.7 C) (01/25 1220) Temp Source: Oral (01/25 0421) BP: 128/96 (01/25 1220) Pulse Rate: 90 (01/25 1220)  Labs: Recent Labs     0000 01/04/20 1641 01/04/20 1642 01/04/20 1841 01/04/20 2147 01/05/20 0026 01/05/20 0712 01/05/20 1619 01/06/20 0532 01/06/20 1038 01/06/20 1455 01/07/20 0831  HGB   < >  --    < >  --   --   --  14.0  --  14.3  --   --  13.6  HCT   < >  --    < >  --   --   --  40.8  --  42.5  --   --  40.2  PLT   < >  --    < >  --   --   --  174  --  204  --   --  199  LABPROT  --  16.6*  --   --   --   --   --   --   --  15.9*  --  16.5*  INR  --  1.4*  --   --   --   --   --   --   --  1.3*  --  1.3*  HEPARINUNFRC  --   --   --   --   --   --  0.36   < > 0.15*  --  0.28* 0.60  CREATININE  --   --    < >  --   --    < > 1.25*  --  1.20  --   --  1.59*  TROPONINIHS  --  68*   < > 100* 149*  --  100*  --   --   --   --   --    < > = values in this interval not displayed.      Medical History: Past Medical History:  Diagnosis Date  . Anxiety   . Arthritis   . CAD (coronary artery disease)    a.  1993 s/p MI - [facility];  b. s/p BMS to LAD '00;  c. PTCA 2nd diagonal 2010;  d. 02/18/12 Cath: moderate nonobs dzs - med rx;  e.  01/2015 Cath: LM nl, LAD 40-29m ISR, 70-13m/d, d1 90p (3.0x16 Synergy DES), D2 50-60, LCX nl, OM1 50p, 72m (2.5x12 Synergy DES), RCA nl, EF 30-35%.  . Chronic combined systolic and diastolic CHF (congestive heart failure) ([place])    a. 12/2014 Echo: EF 30-35%, Gr2 DD, mod MR, sev dil LA.  . CKD (chronic kidney disease), stage III ([place])   . Depression   . ED (erectile dysfunction)   . GERD (gastroesophageal reflux disease)   . Hyperlipidemia   . Hypertension   . Ischemic  cardiomyopathy    a. s/p St. [name] (Atlas) ICD implanted in [place] 2007;  b. 12/2014 Echo: Ef 30-35%.  . MVP (mitral valve prolapse)    a. s/p MV annuloplasty at [facility] 2004.  Marland Kitchen Persistent atrial fibrillation ([place])    a. noted on ICD interrogation '10 - not previously on [place] - CHA2DS2VASc = 5.  . Type II diabetes mellitus ([place])    uncontrolled     Assessment: 85 yom with history  of AFib (history of tissue MVR), on warfarin at home, INR on admission 1.4. PTA dose: 1.5 mg on Mon/Fri, 2 mg on all other days. Patient found to have elevated troponins. Pharmacy to dose heparin.   No interventions planned and pharmacy consulted to dose apixaban.  -he was noted on tegretol PTA (last dose was a few days ago) which due to p-glycoprotein induction (resulting in decreased apixaban concentrations) is contraindicated with apixban  . Spoke with family and timing of last dose is not clear. Tegretol XL has a long have life ~ 35-40 hrs.  One study showed that enzyme induction should be reduced by about half at 3 days and by 75% at 7 days, and enzyme deinduction would be essentially complete within 2 weeks following complete discontinuation of carbamazepine  I spoke with Mr Berthelsen- he takes Tegretol for mood stabilization (primarily for anger) and has been on this for years. He is okay with stopping this medication  Goal of Therapy:  Heparin level 0.3-0.7 units/ml Monitor platelets by anticoagulation protocol: Yes   Plan:  -Continue heparin until 1/26 -To maximize time off of Tegretol will start apixaban 5mg  po bid on 1/26 in the morning --Daily heparin level and CBC -Do not continue Tegretol at discharge  Hildred Laser, PharmD Clinical Pharmacist **Pharmacist phone directory can now be found on [email or web address] ([email or web address]).  Listed under [place].

## 2020-01-07 NOTE — Progress Notes (Signed)
PROGRESS NOTE    [name] [name]  WRU:[id number] DOB: 1950/12/27 DOA: 01/04/2020 PCP: [name], PA    Brief Narrative: HPI per Dr. Keene Breath Wirtanen  is a 69 y.o. male,  w hypertension, hyperlipidemia, Dm2, CKD stage3, CAD s/p BMS to LAD 2000, PTCA 2nd diag 2010, DES LAD and DES OM1 2016, Mitral valve prolapse/ moderate MR, h/o MVR 2004, CHF (EF 25-30%) s/p AICD, presents with dyspnea for the past 4 days, along with chest pain for the past 2 days. Substernal "sharp" with radiation to the left shoulder/ arm,  Fairly constant.  Pt denies headache, alteration in sense of taste or smell, fever, chills, cough, palp, n/v, diarrhea, brbpr, black stool, dysuria, hematuria.  Pt noted ?syncope to ED physician  Pt was on hospice but was having difficulty getting his medication and presented to ED for evaluation    In ED,  T 97.5, P 94, R 16, Bp 143/98  Pox 98% on RA Wt 108.9kg   CT brain  IMPRESSION: 1. No acute intracranial hemorrhage. 2. No acute/traumatic cervical spine pathology.  CXR IMPRESSION: 1. Evidence of prior median sternotomy. 2. Mild right basilar atelectasis and/or infiltrate. 3. Stable cardiomegaly.   Trop 68->  100-> 149  Wbc 7.7, Hgb 14.4, Plt 202 Na 124, K 4.5, Bun 23, Creatinine 1.45 ASt 25, Alt 23 Glucose 627 Hco3 20, AG 13 BNP 165.2 INR 1.4  FOBT negative Urinalysis negative   ED consulted cardiology and they will follow, no STEMI per ED/ Cardiology   Pt will be admitted for chest pain ? Syncope and hyperglycemia    Assessment & Plan:   Principal Problem:   Chest pain Active Problems:   CAD (coronary artery disease)   Syncope   DM (diabetes mellitus), type 2 (HCC)   Diarrhea   Abdominal pain   Chronic combined systolic and diastolic heart failure (HCC)   Benign prostatic hyperplasia  1 chest pain Patient presented with chest pain noted to have elevated troponins as high as 149.  Patient noted to have been on  hospice and after discharge from hospice not on any of his cardiac medications since then.  2D echo a EF of 25 to 30%, mildly increased LVH, left ventricular global hypokinesis, severely dilated left atrium, mildly dilated right atrium, mild mitral valve regurgitation, mitral valve repair/replaced. Continue aspirin, Lipitor, oral Lasix, metoprolol, IV heparin.  Cardiology following and appreciate input and recommendations.  2.?? Syncope Per admitting physician ED physician did state patient had a syncopal episode.  Patient also noted to have had multiple episodes of diarrhea prior to admission.  Concern for possible volume depletion.  Patient not been on any medications since discharge from hospice.  High-sensitivity troponins were rising/elevated.  Patient started on a heparin drip per cardiology.  St. [name] device to be interrogated per cardiology.  2D echo with a EF of 25 to 30%, mildly increased LVH, left ventricular global hypokinesis, severely dilated left atrium, mildly dilated right atrium, mild mitral valve regurgitation, mitral valve repair/replaced. Per cardiology.  3.  Chronic combined systolic and diastolic heart failure Patient states still with shortness of breath on exertion however states chest tightness has improved. Patient does not look volume overloaded on examination.  Patient noted have been off his cardiac medications after being placed in hospice.  On admission patient noted to, bouts of diarrhea and likely volume depleted.  2D echo with a EF of 25 to 30%, mildly increased LVH, left ventricular global hypokinesis, severely dilated left atrium, mildly  dilated right atrium, mild mitral valve regurgitation, mitral valve repair/replaced.  Lasix dose doubled yesterday and patient with a urine output of 2.025 L over the past 24 hours.  Patient is -1.8 L during this hospitalization.  Continue current cardiac medications of Lipitor, aspirin, Toprol-XL. Cardiology following and appreciate  input and recommendations.  4.  Diarrhea C. difficile PCR pending.  Patient with no bouts of diarrhea over the past 2-3 days.  Contact precautions have been discontinued.  Supportive care.  Follow.   5.  Persistent atrial fibrillation Patient currently in A. fib and irregularly irregular.  2D echo with a EF of 25 to 30%, mildly increased LVH, left ventricular global hypokinesis, severely dilated left atrium, mildly dilated right atrium, mild mitral valve regurgitation, mitral valve repair/replaced.  Continue Toprol-XL and amiodarone for rate control.  IV heparin. Cardiology following and have recommended patient's amiodarone to be resumed and patient to be started back on Coumadin. Per cardiology.   6.  Poorly controlled diabetes mellitus type 2 Hemoglobin A1c 11.0.  CBG of 251 this morning.  Patient noted to have initially been on hospice however no longer on hospice and since then has not received any of his medications or been able to fill them including his insulin.  Oral hypoglycemic agents on hold.  Increase Lantus to 35 units daily.  Continue sliding scale insulin.  Consult with diabetic coordinator.  7.  Gastroesophageal reflux disease Continue PPI.  8.  BPH Continue Flomax.   9.  Anxiety Xanax as needed.   DVT prophylaxis: Heparin Code Status: Full Family Communication: Updated patient.  No family at bedside. Disposition Plan:  . Patient came from: Home            . Anticipated d/c place: Home . Barriers to d/c OR conditions which need to be met to effect a safe d/c: Clinical improvement and clearance by cardiology.   Consultants:   Cardiology: Dr. Gardiner Rhyme 01/05/2020  Procedures:  CT head CT/ C-spine 01/04/2020  Abdominal films pending 01/05/2020  Chest x-ray 01/04/2020  Carotid ultrasound 01/05/2020  2D echo 01/06/2020  Antimicrobials:   None   Subjective: Patient sitting up in chair.  States still with intermittent shortness of breath with no significant  improvement since admission.  States shortness of breath on exertion.  Patient with some complaints of chest tightness which she states is slowly improving since admission.  No diarrhea.  No abdominal pain.   Objective: Vitals:   01/06/20 1707 01/06/20 2043 01/07/20 0037 01/07/20 0421  BP: 93/72 (!) 131/92 125/82 126/89  Pulse: (!) 55 86 86 92  Resp: _0 Temp: 98.3 F (36.8 C) 98.8 F (37.1 C) 97.8 F (36.6 C) 97.8 F (36.6 C)  TempSrc: Oral Oral Oral Oral  SpO2: 100% 100% 100% 97%  Weight:    105.7 kg  Height:        Intake/Output Summary (Last 24 hours) at 01/07/2020 0958 Last data filed at 01/07/2020 0826 Gross per 24 hour  Intake 1291.29 ml  Output 1750 ml  Net -458.71 ml   Filed Weights   01/05/20 0638 01/06/20 0500 01/07/20 0421  Weight: 105.7 kg 104.8 kg 105.7 kg    Examination:  General exam: NAD Respiratory system: Decreased breath sounds in the bases.  No wheezing.  Speaking in full sentences.  Normal respiratory effort. Cardiovascular system: Irregularly irregular.  No JVD, no murmurs, no rubs, no gallops.  No lower extremity edema.   Gastrointestinal system: Abdomen is soft, nontender, nondistended,  positive bowel sounds.  No rebound.  No guarding.  Central nervous system: Alert and oriented. No focal neurological deficits. Extremities: Symmetric 5 x 5 power. Skin: No rashes, lesions or ulcers Psychiatry: Judgement and insight appear normal. Mood & affect appropriate.     Data Reviewed: I have personally reviewed following labs and imaging studies  CBC: Recent Labs  Lab 01/04/20 1642 01/04/20 1655 01/05/20 0712 01/06/20 0532 01/07/20 0831  WBC 7.7  --  5.8 5.6 5.0  NEUTROABS 6.3  --   --   --   --   HGB 14.4 14.6 14.0 14.3 13.6  HCT 42.0 43.0 40.8 42.5 40.2  MCV 90.5  --  89.5 90.6 91.4  PLT 202  --  174 204 740   Basic Metabolic Panel: Recent Labs  Lab 01/04/20 1642 01/04/20 1642 01/04/20 1655 01/05/20 0026 01/05/20 0712  01/06/20 0532 01/07/20 0831  NA 124*   < > 128* 134* 136 138 136  K 4.5   < > 4.6 3.7 3.7 4.3 4.2  CL 91*  --   --  102 105 104 102  CO2 20*  --   --  23 20* 25 24  GLUCOSE 627*  --   --  279* 214* 234* 391*  BUN 23  --   --  _0 CREATININE 1.45*  --   --  1.34* 1.25* 1.20 1.59*  CALCIUM 8.9  --   --  8.9 8.8* 9.2 8.8*  MG  --   --   --   --   --  1.9 1.8   < > = values in this interval not displayed.   GFR: Estimated Creatinine Clearance: 54.3 mL/min (A) (by C-G formula based on SCr of 1.59 mg/dL (H)). Liver Function Tests: Recent Labs  Lab 01/04/20 1642 01/05/20 0712 01/06/20 0532  AST _1 ALT _2 ALKPHOS 87 83 81  BILITOT 1.3* 0.7 0.8  PROT 6.6 5.7* 6.1*  ALBUMIN 3.9 3.4* 3.5   Recent Labs  Lab 01/05/20 0712  LIPASE 22   No results for input(s): AMMONIA in the last 168 hours. Coagulation Profile: Recent Labs  Lab 01/04/20 1641 01/06/20 1038 01/07/20 0831  INR 1.4* 1.3* 1.3*   Cardiac Enzymes: No results for input(s): CKTOTAL, CKMB, CKMBINDEX, TROPONINI in the last 168 hours. BNP (last 3 results) No results for input(s): PROBNP in the last 8760 hours. HbA1C: Recent Labs    01/06/20 0532  HGBA1C 11.0*   CBG: Recent Labs  Lab 01/06/20 1703 01/06/20 2126 01/07/20 0040 01/07/20 0419 01/07/20 0756  GLUCAP 339* 272* 255* 209* 251*   Lipid Profile: Recent Labs    01/06/20 0532  CHOL 124  HDL 32*  LDLCALC 63  TRIG 147  CHOLHDL 3.9   Thyroid Function Tests: No results for input(s): TSH, T4TOTAL, FREET4, T3FREE, THYROIDAB in the last 72 hours. Anemia Panel: No results for input(s): VITAMINB12, FOLATE, FERRITIN, TIBC, IRON, RETICCTPCT in the last 72 hours. Sepsis Labs: No results for input(s): PROCALCITON, LATICACIDVEN in the last 168 hours.  Recent Results (from the past 240 hour(s))  SARS CORONAVIRUS 2 (TAT 6-24 HRS) Nasopharyngeal Nasopharyngeal Swab     Status: None   Collection Time: 01/04/20  8:40 PM   Specimen:  Nasopharyngeal Swab  Result Value Ref Range Status   SARS Coronavirus 2 NEGATIVE NEGATIVE Final    Comment: (NOTE) SARS-CoV-2 target nucleic acids are NOT DETECTED. The SARS-CoV-2 RNA is generally detectable in upper  and lower respiratory specimens during the acute phase of infection. Negative results do not preclude SARS-CoV-2 infection, do not rule out co-infections with other pathogens, and should not be used as the sole basis for treatment or other patient management decisions. Negative results must be combined with clinical observations, patient history, and epidemiological information. The expected result is Negative. Fact Sheet for Patients: SugarRoll.be Fact Sheet for Healthcare Providers: https://www.woods-mathews.com/ This test is not yet approved or cleared by the [place] FDA and  has been authorized for detection and/or diagnosis of SARS-CoV-2 by FDA under an Emergency Use Authorization (EUA). This EUA will remain  in effect (meaning this test can be used) for the duration of the COVID-19 declaration under Section 56 4(b)(1) of the Act, 21 U.S.C. section 360bbb-3(b)(1), unless the authorization is terminated or revoked sooner. Performed at [facility], [place] [address], [place], [place] [address]          Radiology Studies: DG Abd 2 Views  Result Date: 01/05/2020 CLINICAL DATA:  Abdominal pain EXAM: ABDOMEN - 2 VIEW COMPARISON:  None. FINDINGS: Overall bowel gas pattern is nonobstructive. No evidence of soft tissue mass or abnormal fluid collection. No evidence of free intraperitoneal air. No evidence of renal or ureteral calculi. Vascular calcifications noted in the pelvis. Lung bases appear clear. Visualized osseous structures are unremarkable. IMPRESSION: Nonobstructive bowel gas pattern and no evidence of acute intra-abdominal abnormality. Electronically Signed   By: Franki Cabot M.D.   On: 01/05/2020 12:32    ECHOCARDIOGRAM COMPLETE  Result Date: 01/06/2020   ECHOCARDIOGRAM REPORT   Patient Name:   [facility] Date of Exam: 01/06/2020 Medical Rec #:  [id number]           Height:       72.0 in Accession #:    [id number]          Weight:       231.1 lb Date of Birth:  [birth year]           BSA:          2.27 m Patient Age:    73 years            BP:           100/71 mmHg Patient Gender: M                   HR:           83 bpm. Exam Location:  Inpatient Procedure: 2D Echo Indications:    Chest Pain R07.9  History:        Patient has prior history of Echocardiogram examinations, most                 recent 12/03/2016. CAD, MV Annuloplasty, Arrythmias:Atrial                 Fibrillation; Risk Factors:Diabetes, Dyslipidemia and                 Hypertension.  Sonographer:    Mikki Santee RDCS (AE) Referring Phys: Roy  1. Left ventricular ejection fraction, by visual estimation, is 25 to 30%. The left ventricle has severely decreased function. There is mildly increased left ventricular hypertrophy.  2. The left ventricle demonstrates global hypokinesis.  3. Global right ventricle has mildly reduced systolic function.The right ventricular size is normal. No increase in right ventricular wall thickness.  4. Left atrial size was severely dilated.  5. Right atrial size was  mildly dilated.  6. The mitral valve has been repaired/replaced. Mild mitral valve regurgitation.  7. The tricuspid valve is grossly normal.  8. The tricuspid valve is grossly normal. Tricuspid valve regurgitation is mild.  9. The aortic valve is tricuspid. Aortic valve regurgitation is not visualized. 10. The pulmonic valve was grossly normal. Pulmonic valve regurgitation is trivial. 11. Aortic dilatation noted. 12. There is mild dilatation of the aortic root and of the ascending aorta. 13. Normal pulmonary artery systolic pressure. 14. A pacer wire is visualized. 15. The inferior vena cava is normal in size with greater  than 50% respiratory variability, suggesting right atrial pressure of 3 mmHg. FINDINGS  Left Ventricle: Left ventricular ejection fraction, by visual estimation, is 25 to 30%. The left ventricle has severely decreased function. The left ventricle demonstrates global hypokinesis. The left ventricular internal cavity size was the left ventricle is normal in size. There is mildly increased left ventricular hypertrophy. Concentric left ventricular hypertrophy. Right Ventricle: The right ventricular size is normal. No increase in right ventricular wall thickness. Global RV systolic function is has mildly reduced systolic function. The tricuspid regurgitant velocity is 1.71 m/s, and with an assumed right atrial pressure of 3 mmHg, the estimated right ventricular systolic pressure is normal at 14.7 mmHg. Left Atrium: Left atrial size was severely dilated. Right Atrium: Right atrial size was mildly dilated Pericardium: There is no evidence of pericardial effusion. Mitral Valve: The mitral valve has been repaired/replaced. Mild mitral valve regurgitation. MV peak gradient, 7.8 mmHg. Tricuspid Valve: The tricuspid valve is grossly normal. Tricuspid valve regurgitation is mild. Aortic Valve: The aortic valve is tricuspid. Aortic valve regurgitation is not visualized. Pulmonic Valve: The pulmonic valve was grossly normal. Pulmonic valve regurgitation is trivial. Pulmonic regurgitation is trivial. Aorta: Aortic dilatation noted. There is mild dilatation of the aortic root and of the ascending aorta. Venous: The inferior vena cava is normal in size with greater than 50% respiratory variability, suggesting right atrial pressure of 3 mmHg. IAS/Shunts: No atrial level shunt detected by color flow Doppler. Additional Comments: A pacer wire is visualized.  LEFT VENTRICLE PLAX 2D LVIDd:         5.80 cm LVIDs:         5.00 cm LV PW:         1.30 cm LV IVS:        1.30 cm LVOT diam:     2.40 cm LV SV:         48 ml LV SV Index:   20.69  LVOT Area:     4.52 cm  LEFT ATRIUM              Index       RIGHT ATRIUM           Index LA diam:        5.00 cm  2.21 cm/m  RA Area:     23.20 cm LA Vol (A2C):   108.0 ml 47.67 ml/m RA Volume:   63.70 ml  28.12 ml/m LA Vol (A4C):   124.0 ml 54.73 ml/m LA Biplane Vol: 126.0 ml 55.62 ml/m  AORTIC VALVE LVOT Vmax:   71.70 cm/s LVOT Vmean:  47.400 cm/s LVOT VTI:    0.121 m  AORTA Ao Root diam: 4.00 cm MITRAL VALVE              TRICUSPID VALVE MV Area (PHT): 2.89 cm   TR Peak grad:   11.7 mmHg MV Peak grad:  7.8 mmHg   TR Vmax:        198.00 cm/s MV Mean grad:  3.0 mmHg MV Vmax:       1.39 m/s   SHUNTS MV Vmean:      77.9 cm/s  Systemic VTI:  0.12 m MV VTI:        0.32 m     Systemic Diam: 2.40 cm MV PHT:        76.00 msec MR Peak grad: 80.3 mmHg MR Mean grad: 50.0 mmHg MR Vmax:      448.00 cm/s MR Vmean:     335.0 cm/s  Kate Sable MD Electronically signed by Kate Sable MD Signature Date/Time: 01/06/2020/12:58:17 PM    Final    VAS US CAROTID  Result Date: 01/05/2020 Carotid Arterial Duplex Study Indications:       Syncope. Comparison Study:  No prior study. Performing Technologist: Maudry Mayhew MHA, RDMS, RVT, RDCS  Examination Guidelines: A complete evaluation includes B-mode imaging, spectral Doppler, color Doppler, and power Doppler as needed of all accessible portions of each vessel. Bilateral testing is considered an integral part of a complete examination. Limited examinations for reoccurring indications may be performed as noted.  Right Carotid Findings: +----------+-------+-------+--------+---------------------------------+--------+           PSV    EDV    StenosisPlaque Description               Comments           cm/s   cm/s                                                     +----------+-------+-------+--------+---------------------------------+--------+ CCA Prox  82     11                                                        +----------+-------+-------+--------+---------------------------------+--------+ CCA Distal55     10                                                       +----------+-------+-------+--------+---------------------------------+--------+ ICA Prox  36     10             heterogenous, irregular and                                               calcific                                  +----------+-------+-------+--------+---------------------------------+--------+ ICA Distal84     30                                                       +----------+-------+-------+--------+---------------------------------+--------+  ECA       55     12             smooth and heterogenous                   +----------+-------+-------+--------+---------------------------------+--------+ +----------+--------+-------+----------------+-------------------+           PSV cm/sEDV cmsDescribe        Arm Pressure (mmHG) +----------+--------+-------+----------------+-------------------+ [id number]             Multiphasic, WNL                    +----------+--------+-------+----------------+-------------------+ +---------+--------+--+--------+-+---------+ VertebralPSV cm/s25EDV cm/s8Antegrade +---------+--------+--+--------+-+---------+  Left Carotid Findings: +----------+--------+--------+--------+--------------------------+--------+           PSV cm/sEDV cm/sStenosisPlaque Description        Comments +----------+--------+--------+--------+--------------------------+--------+ CCA Prox  79      18              heterogenous and smooth            +----------+--------+--------+--------+--------------------------+--------+ CCA Distal54      13              heterogenous and irregular         +----------+--------+--------+--------+--------------------------+--------+ ICA Prox  47      18              heterogenous and irregular          +----------+--------+--------+--------+--------------------------+--------+ ICA Distal73      33                                                 +----------+--------+--------+--------+--------------------------+--------+ ECA       83      22              smooth and heterogenous            +----------+--------+--------+--------+--------------------------+--------+ +----------+--------+--------+--------------+-------------------+           PSV cm/sEDV cm/sDescribe      Arm Pressure (mmHG) +----------+--------+--------+--------------+-------------------+ Subclavian                Not identified                    +----------+--------+--------+--------------+-------------------+ +---------+--------+--+--------+--+---------+ VertebralPSV cm/s32EDV cm/s11Antegrade +---------+--------+--+--------+--+---------+   Summary: Right Carotid: Velocities in the right ICA are consistent with a 1-39% stenosis. Left Carotid: Velocities in the left ICA are consistent with a 1-39% stenosis. Vertebrals:  Bilateral vertebral arteries demonstrate antegrade flow. Subclavians: Left subclavian artery was not visualized. Normal flow hemodynamics              were seen in the right subclavian artery. *See table(s) above for measurements and observations.     Preliminary         Scheduled Meds: . amiodarone  200 mg Oral Daily  . aspirin EC  81 mg Oral Daily  . atorvastatin  40 mg Oral q1800  . furosemide  40 mg Oral BID  . insulin aspart  0-9 Units Subcutaneous Q4H  . insulin glargine  35 Units Subcutaneous Daily  . metoprolol succinate  25 mg Oral Daily  . pantoprazole  40 mg Oral Daily  . sodium chloride flush  3 mL Intravenous Q12H  . tamsulosin  0.4 mg Oral Daily  . Warfarin - Pharmacist Dosing Inpatient   Does not apply [phone]  Continuous Infusions: . sodium chloride    . heparin 1,500 Units/hr (01/06/20 2345)     LOS: 3 days    Time spent: 40 minutes    Irine Seal,  MD [facility]   To contact the attending provider between 7A-7P or the covering provider during after hours 7P-7A, please log into the web site [email or web address] and access using universal [place] password for that web site. If you do not have the password, please call the hospital operator.  01/07/2020, 9:58 AM

## 2020-01-07 NOTE — Progress Notes (Signed)
Progress Note  Patient Name: [name] [name] Date of Encounter: 01/07/2020  Primary Cardiologist: Dr. Peter Martinique, MD   Subjective   Denies recurrent chest pain. SOB improved   Inpatient Medications    Scheduled Meds: . amiodarone  200 mg Oral Daily  . aspirin EC  81 mg Oral Daily  . atorvastatin  20 mg Oral q1800  . furosemide  40 mg Oral BID  . insulin aspart  0-9 Units Subcutaneous Q4H  . insulin glargine  35 Units Subcutaneous Daily  . metoprolol succinate  25 mg Oral Daily  . pantoprazole  40 mg Oral Daily  . sodium chloride flush  3 mL Intravenous Q12H  . tamsulosin  0.4 mg Oral Daily  . Warfarin - Pharmacist Dosing Inpatient   Does not apply q1800   Continuous Infusions: . sodium chloride    . heparin 1,500 Units/hr (01/06/20 2345)   PRN Meds: sodium chloride, acetaminophen **OR** acetaminophen, ALPRAZolam, sodium chloride flush   Vital Signs    Vitals:   01/06/20 1707 01/06/20 2043 01/07/20 0037 01/07/20 0421  BP: 93/72 (!) 131/92 125/82 126/89  Pulse: (!) 55 86 86 92  Resp: 16 16 18 18   Temp: 98.3 F (36.8 C) 98.8 F (37.1 C) 97.8 F (36.6 C) 97.8 F (36.6 C)  TempSrc: Oral Oral Oral Oral  SpO2: 100% 100% 100% 97%  Weight:    105.7 kg  Height:        Intake/Output Summary (Last 24 hours) at 01/07/2020 0758 Last data filed at 01/07/2020 0425 Gross per 24 hour  Intake 1291.29 ml  Output 2025 ml  Net -733.71 ml   Filed Weights   01/05/20 0638 01/06/20 0500 01/07/20 0421  Weight: 105.7 kg 104.8 kg 105.7 kg    Physical Exam   General: Well developed, well nourished, NAD Neck: Negative for carotid bruits. No JVD Lungs: Diminished in the bases. No wheezes, rales, or rhonchi. Breathing is unlabored. Cardiovascular: Irregularly iregular with S1 S2. + murmur Abdomen: Soft, non-tender, non-distended. No obvious abdominal masses. Extremities: No edema. DP pulses 2+ bilaterally Neuro: Alert and oriented. No focal deficits. No facial  asymmetry. MAE spontaneously. Psych: Responds to questions appropriately with normal affect.    Labs    Chemistry Recent Labs  Lab 01/04/20 1642 01/04/20 1655 01/05/20 0026 01/05/20 0712 01/06/20 0532  NA 124*   < > 134* 136 138  K 4.5   < > 3.7 3.7 4.3  CL 91*   < > 102 105 104  CO2 20*   < > 23 20* 25  GLUCOSE 627*   < > 279* 214* 234*  BUN 23   < > 21 20 18   CREATININE 1.45*   < > 1.34* 1.25* 1.20  CALCIUM 8.9   < > 8.9 8.8* 9.2  PROT 6.6  --   --  5.7* 6.1*  ALBUMIN 3.9  --   --  3.4* 3.5  AST 25  --   --  22 23  ALT 23  --   --  21 22  ALKPHOS 87  --   --  83 81  BILITOT 1.3*  --   --  0.7 0.8  GFRNONAA 48*   < > 53* 58* >60  GFRAA 56*   < > >60 >60 >60  ANIONGAP 13   < > 9 11 9    < > = values in this interval not displayed.     Hematology Recent Labs  Lab 01/04/20 1642  01/04/20 1642 01/04/20 1655 01/05/20 0712 01/06/20 0532  WBC 7.7  --   --  5.8 5.6  RBC 4.64  --   --  4.56 4.69  HGB 14.4   < > 14.6 14.0 14.3  HCT 42.0   < > 43.0 40.8 42.5  MCV 90.5  --   --  89.5 90.6  MCH 31.0  --   --  30.7 30.5  MCHC 34.3  --   --  34.3 33.6  RDW 13.1  --   --  13.1 13.2  PLT 202  --   --  174 204   < > = values in this interval not displayed.    Cardiac EnzymesNo results for input(s): TROPONINI in the last 168 hours. No results for input(s): TROPIPOC in the last 168 hours.   BNP Recent Labs  Lab 01/04/20 1642  BNP 165.2*     DDimer No results for input(s): DDIMER in the last 168 hours.   Radiology    DG Abd 2 Views  Result Date: 01/05/2020 CLINICAL DATA:  Abdominal pain EXAM: ABDOMEN - 2 VIEW COMPARISON:  None. FINDINGS: Overall bowel gas pattern is nonobstructive. No evidence of soft tissue mass or abnormal fluid collection. No evidence of free intraperitoneal air. No evidence of renal or ureteral calculi. Vascular calcifications noted in the pelvis. Lung bases appear clear. Visualized osseous structures are unremarkable. IMPRESSION: Nonobstructive  bowel gas pattern and no evidence of acute intra-abdominal abnormality. Electronically Signed   By: Franki Cabot M.D.   On: 01/05/2020 12:32   ECHOCARDIOGRAM COMPLETE  Result Date: 01/06/2020   ECHOCARDIOGRAM REPORT   Patient Name:   [name] [name] [facility] Date of Exam: 01/06/2020 Medical Rec #:  [id number]           Height:       72.0 in Accession #:    [id number]          Weight:       231.1 lb Date of Birth:  03-12-1951           BSA:          2.27 m Patient Age:    69 years            BP:           100/71 mmHg Patient Gender: M                   HR:           83 bpm. Exam Location:  Inpatient Procedure: 2D Echo Indications:    Chest Pain R07.9  History:        Patient has prior history of Echocardiogram examinations, most                 recent 12/03/2016. CAD, MV Annuloplasty, Arrythmias:Atrial                 Fibrillation; Risk Factors:Diabetes, Dyslipidemia and                 Hypertension.  Sonographer:    Mikki Santee RDCS (AE) Referring Phys: [place]  1. Left ventricular ejection fraction, by visual estimation, is 25 to 30%. The left ventricle has severely decreased function. There is mildly increased left ventricular hypertrophy.  2. The left ventricle demonstrates global hypokinesis.  3. Global right ventricle has mildly reduced systolic function.The right ventricular size is normal. No increase in right ventricular wall thickness.  4. Left atrial size was  severely dilated.  5. Right atrial size was mildly dilated.  6. The mitral valve has been repaired/replaced. Mild mitral valve regurgitation.  7. The tricuspid valve is grossly normal.  8. The tricuspid valve is grossly normal. Tricuspid valve regurgitation is mild.  9. The aortic valve is tricuspid. Aortic valve regurgitation is not visualized. 10. The pulmonic valve was grossly normal. Pulmonic valve regurgitation is trivial. 11. Aortic dilatation noted. 12. There is mild dilatation of the aortic root and of the ascending  aorta. 13. Normal pulmonary artery systolic pressure. 14. A pacer wire is visualized. 15. The inferior vena cava is normal in size with greater than 50% respiratory variability, suggesting right atrial pressure of 3 mmHg. FINDINGS  Left Ventricle: Left ventricular ejection fraction, by visual estimation, is 25 to 30%. The left ventricle has severely decreased function. The left ventricle demonstrates global hypokinesis. The left ventricular internal cavity size was the left ventricle is normal in size. There is mildly increased left ventricular hypertrophy. Concentric left ventricular hypertrophy. Right Ventricle: The right ventricular size is normal. No increase in right ventricular wall thickness. Global RV systolic function is has mildly reduced systolic function. The tricuspid regurgitant velocity is 1.71 m/s, and with an assumed right atrial pressure of 3 mmHg, the estimated right ventricular systolic pressure is normal at 14.7 mmHg. Left Atrium: Left atrial size was severely dilated. Right Atrium: Right atrial size was mildly dilated Pericardium: There is no evidence of pericardial effusion. Mitral Valve: The mitral valve has been repaired/replaced. Mild mitral valve regurgitation. MV peak gradient, 7.8 mmHg. Tricuspid Valve: The tricuspid valve is grossly normal. Tricuspid valve regurgitation is mild. Aortic Valve: The aortic valve is tricuspid. Aortic valve regurgitation is not visualized. Pulmonic Valve: The pulmonic valve was grossly normal. Pulmonic valve regurgitation is trivial. Pulmonic regurgitation is trivial. Aorta: Aortic dilatation noted. There is mild dilatation of the aortic root and of the ascending aorta. Venous: The inferior vena cava is normal in size with greater than 50% respiratory variability, suggesting right atrial pressure of 3 mmHg. IAS/Shunts: No atrial level shunt detected by color flow Doppler. Additional Comments: A pacer wire is visualized.  LEFT VENTRICLE PLAX 2D LVIDd:          5.80 cm LVIDs:         5.00 cm LV PW:         1.30 cm LV IVS:        1.30 cm LVOT diam:     2.40 cm LV SV:         48 ml LV SV Index:   20.69 LVOT Area:     4.52 cm  LEFT ATRIUM              Index       RIGHT ATRIUM           Index LA diam:        5.00 cm  2.21 cm/m  RA Area:     23.20 cm LA Vol (A2C):   108.0 ml 47.67 ml/m RA Volume:   63.70 ml  28.12 ml/m LA Vol (A4C):   124.0 ml 54.73 ml/m LA Biplane Vol: 126.0 ml 55.62 ml/m  AORTIC VALVE LVOT Vmax:   71.70 cm/s LVOT Vmean:  47.400 cm/s LVOT VTI:    0.121 m  AORTA Ao Root diam: 4.00 cm MITRAL VALVE              TRICUSPID VALVE MV Area (PHT): 2.89 cm   TR Peak grad:  11.7 mmHg MV Peak grad:  7.8 mmHg   TR Vmax:        198.00 cm/s MV Mean grad:  3.0 mmHg MV Vmax:       1.39 m/s   SHUNTS MV Vmean:      77.9 cm/s  Systemic VTI:  0.12 m MV VTI:        0.32 m     Systemic Diam: 2.40 cm MV PHT:        76.00 msec MR Peak grad: 80.3 mmHg MR Mean grad: 50.0 mmHg MR Vmax:      448.00 cm/s MR Vmean:     335.0 cm/s  Kate Sable MD Electronically signed by Kate Sable MD Signature Date/Time: 01/06/2020/12:58:17 PM    Final    VAS US CAROTID  Result Date: 01/05/2020 Carotid Arterial Duplex Study Indications:       Syncope. Comparison Study:  No prior study. Performing Technologist: Maudry Mayhew MHA, RDMS, RVT, RDCS  Examination Guidelines: A complete evaluation includes B-mode imaging, spectral Doppler, color Doppler, and power Doppler as needed of all accessible portions of each vessel. Bilateral testing is considered an integral part of a complete examination. Limited examinations for reoccurring indications may be performed as noted.  Right Carotid Findings: +----------+-------+-------+--------+---------------------------------+--------+           PSV    EDV    StenosisPlaque Description               Comments           cm/s   cm/s                                                      +----------+-------+-------+--------+---------------------------------+--------+ CCA Prox  82     11                                                       +----------+-------+-------+--------+---------------------------------+--------+ CCA Distal55     10                                                       +----------+-------+-------+--------+---------------------------------+--------+ ICA Prox  36     10             heterogenous, irregular and                                               calcific                                  +----------+-------+-------+--------+---------------------------------+--------+ ICA Distal84     30                                                       +----------+-------+-------+--------+---------------------------------+--------+  ECA       55     12             smooth and heterogenous                   +----------+-------+-------+--------+---------------------------------+--------+ +----------+--------+-------+----------------+-------------------+           PSV cm/sEDV cmsDescribe        Arm Pressure (mmHG) +----------+--------+-------+----------------+-------------------+ [id number]             Multiphasic, WNL                    +----------+--------+-------+----------------+-------------------+ +---------+--------+--+--------+-+---------+ VertebralPSV cm/s25EDV cm/s8Antegrade +---------+--------+--+--------+-+---------+  Left Carotid Findings: +----------+--------+--------+--------+--------------------------+--------+           PSV cm/sEDV cm/sStenosisPlaque Description        Comments +----------+--------+--------+--------+--------------------------+--------+ CCA Prox  79      18              heterogenous and smooth            +----------+--------+--------+--------+--------------------------+--------+ CCA Distal54      13              heterogenous and irregular          +----------+--------+--------+--------+--------------------------+--------+ ICA Prox  47      18              heterogenous and irregular         +----------+--------+--------+--------+--------------------------+--------+ ICA Distal73      33                                                 +----------+--------+--------+--------+--------------------------+--------+ ECA       83      22              smooth and heterogenous            +----------+--------+--------+--------+--------------------------+--------+ +----------+--------+--------+--------------+-------------------+           PSV cm/sEDV cm/sDescribe      Arm Pressure (mmHG) +----------+--------+--------+--------------+-------------------+ Subclavian                Not identified                    +----------+--------+--------+--------------+-------------------+ +---------+--------+--+--------+--+---------+ VertebralPSV cm/s32EDV cm/s11Antegrade +---------+--------+--+--------+--+---------+   Summary: Right Carotid: Velocities in the right ICA are consistent with a 1-39% stenosis. Left Carotid: Velocities in the left ICA are consistent with a 1-39% stenosis. Vertebrals:  Bilateral vertebral arteries demonstrate antegrade flow. Subclavians: Left subclavian artery was not visualized. Normal flow hemodynamics              were seen in the right subclavian artery. *See table(s) above for measurements and observations.     Preliminary    Telemetry    01/06/20 AF with HR 70-80's - Personally Reviewed  ECG    No new tracing as of 01/07/20- Personally Reviewed  Cardiac Studies   Echocardiogram 01/06/20:   1. Left ventricular ejection fraction, by visual estimation, is 25 to 30%. The left ventricle has severely decreased function. There is mildly increased left ventricular hypertrophy.  2. The left ventricle demonstrates global hypokinesis.  3. Global right ventricle has mildly reduced systolic function.The right  ventricular size is normal. No increase in right ventricular wall thickness.  4. Left atrial size was severely dilated.  5. Right atrial size was mildly  dilated.  6. The mitral valve has been repaired/replaced. Mild mitral valve regurgitation.  7. The tricuspid valve is grossly normal.  8. The tricuspid valve is grossly normal. Tricuspid valve regurgitation is mild.  9. The aortic valve is tricuspid. Aortic valve regurgitation is not visualized. 10. The pulmonic valve was grossly normal. Pulmonic valve regurgitation is trivial. 11. Aortic dilatation noted. 12. There is mild dilatation of the aortic root and of the ascending aorta. 13. Normal pulmonary artery systolic pressure. 14. A pacer wire is visualized. 15. The inferior vena cava is normal in size with greater than 50% respiratory variability, suggesting right atrial pressure of 3 mmHg.  Patient Profile     69 y.o. male with CAD s/p BMS to LAD 2000, PTCA 2nd diag 2010, DES LAD and DES OM1 2016, Mitral valve prolapse/ moderate MR, h/o MVR 2004, CHF (EF 25-30%) s/p AICD ([name]), persistent atrial fibrillation, DM, CKD III, admitted with syncope and chest pain.  Assessment & Plan    1. Chronic systolic and diastolic heart failure: -Per echocardiogram 01/06/20 with LVEF at 25-30% with evidence of LVH and global hypokinesis  -Breathing improved with increasing Lasix to 40mg  PO BID>>continue  -Home cardiac medications unclear due to being on Hospice care  -Continue Toprol for now given stable BPs -Consider Entresto however will need to be mindful of renal dysfunction. If adding Entresto, would decrease Lasix to once daily and monitor closely    -BP>>126/89>125/82>131/92>100/71 -Creatinine, 1.20 today, down from 1.25 yesterday  -Weight, 233lb today, down from 240lb  -I&O, net negative 1.7L since admission   2. Syncope: -Felt to be in the setting of intravascular volume depletion  -No events on device interrogation at hospital  presentation   3. Chest pain: -Peak Trop 149 not consistent with ACS>>felt to be in the setting of medication noncompliance  -Plan to restart medications and if chest pain persists then LHC versus stress testing for further ischemic evaluation   4. Persistent atrial fibrillation: -Unclear if patient was taking Amiodarone on presentation  -Rate controlled currently on metoprolol  -Amio 200mg  PO QD resume 01/06/20   5. DM2: -Uncontrolled, HbA1C>>11.0 this admission  -SSI for glucose control while inpatient status   6. CKD Stage III: -Creatinine, 1.20>>down from 1.25 yesterday   -Baseline appears to be in the 1.4 range  -Valsartan currently on hold until renal function more stable   Signed, Kathyrn Drown NP-C [place] Pager: [phone] 01/07/2020, 7:58 AM     For questions or updates, please contact   Please consult [email or web address] for contact info under Cardiology/STEMI.

## 2020-01-07 NOTE — Evaluation (Signed)
Occupational Therapy Evaluation Patient Details Name: [name] [name] MRN: [id number] DOB: Jan 14, 1951 Today's Date: 01/07/2020    History of Present Illness Het Helferich  is a 69 y.o. male,  w hypertension, hyperlipidemia, Dm2, CKD stage3, CAD s/p BMS to LAD 2000, PTCA 2nd diag 2010, DES LAD and DES OM1 2016, Mitral valve prolapse/ moderate MR, h/o MVR 2004, CHF (EF 25-30%) s/p AICD, presents with dyspnea for the past 4 days, along with chest pain for the past 2 days. Substernal "sharp" with radiation to the left shoulder/ arm,  Fairly constant.   Clinical Impression   Pt is functioning at a supervision level in ADL and mobility due to IV line and hx of multiple falls. Recommended tub seat and pt agreed. Pt declining RW or rollator. No further OT needs.    Follow Up Recommendations  No OT follow up    Equipment Recommendations  Tub/shower seat    Recommendations for Other Services       Precautions / Restrictions Precautions Precautions: Fall Precaution Comments: reports multiple falls Restrictions Weight Bearing Restrictions: No      Mobility Bed Mobility Overal bed mobility: Modified Independent                Transfers Overall transfer level: Needs assistance   Transfers: Sit to/from Stand Sit to Stand: Supervision         General transfer comment: pushed IV pole    Balance Overall balance assessment: Needs assistance   Sitting balance-Leahy Scale: Normal     Standing balance support: Single extremity supported Standing balance-Leahy Scale: Fair                             ADL either performed or assessed with clinical judgement   ADL Overall ADL's : At baseline                                       General ADL Comments: recommended shower seat, pt agreeable     Vision Baseline Vision/History: Wears glasses Wears Glasses: At all times Patient Visual Report: No change from baseline       Perception      Praxis      Pertinent Vitals/Pain       Hand Dominance Right   Extremity/Trunk Assessment Upper Extremity Assessment Upper Extremity Assessment: Overall WFL for tasks assessed   Lower Extremity Assessment Lower Extremity Assessment: Defer to PT evaluation       Communication Communication Communication: HOH   Cognition Arousal/Alertness: Awake/alert Behavior During Therapy: WFL for tasks assessed/performed Overall Cognitive Status: Within Functional Limits for tasks assessed Area of Impairment: Safety/judgement                         Safety/Judgement: Decreased awareness of safety     General Comments: pt not amenable to use of a RW, rollator or shower equipment   General Comments       Exercises     Shoulder Instructions      Home Living Family/patient expects to be discharged to:: Private residence Living Arrangements: Children(step daughter) Available Help at Discharge: Family;Available PRN/intermittently Type of Home: House Home Access: Stairs to enter [employer] of Steps: 3 Entrance Stairs-Rails: Left Home Layout: One level     Bathroom Shower/Tub: Tub/shower unit  Home Equipment: Kasandra Knudsen - single point   Additional Comments: He reports he holds onto shower head for balance.      Prior Functioning/Environment Level of Independence: Independent with assistive device(s)        Comments: amb with cane        OT Problem List: Impaired balance (sitting and/or standing)      OT Treatment/Interventions:      OT Goals(Current goals can be found in the care plan section) Acute Rehab OT Goals Patient Stated Goal: home using cane  OT Frequency:     Barriers to D/C:            Co-evaluation              AM-PAC OT "6 Clicks" Daily Activity     Outcome Measure Help from another person eating meals?: None Help from another person taking care of personal grooming?: A Little Help from another person  toileting, which includes using toliet, bedpan, or urinal?: A Little Help from another person bathing (including washing, rinsing, drying)?: A Little Help from another person to put on and taking off regular upper body clothing?: None Help from another person to put on and taking off regular lower body clothing?: A Little 6 Click Score: 20   End of Session Equipment Utilized During Treatment: Gait belt  Activity Tolerance: Patient tolerated treatment well Patient left: in bed;with call bell/phone within reach  OT Visit Diagnosis: Other abnormalities of gait and mobility (R26.89);Unsteadiness on feet (R26.81)                Time: [id number] OT Time Calculation (min): 26 min Charges:  OT General Charges $OT Visit: 1 Visit OT Evaluation $OT Eval Moderate Complexity: 1 Mod OT Treatments $Self Care/Home Management : 8-22 mins  Nestor Lewandowsky, OTR/L Acute Rehabilitation Services Pager: [phone] Office: [phone]  [name] 01/07/2020, 2:02 PM

## 2020-01-07 NOTE — Progress Notes (Signed)
Physical Therapy Treatment Patient Details Name: [name] [name] MRN: [id number] DOB: 1951/05/16 Today's Date: 01/07/2020    History of Present Illness [name] [name]  is a 69 y.o. male,  w hypertension, hyperlipidemia, Dm2, CKD stage3, CAD s/p BMS to LAD 2000, PTCA 2nd diag 2010, DES LAD and DES OM1 2016, Mitral valve prolapse/ moderate MR, h/o MVR 2004, CHF (EF 25-30%) s/p AICD, presents with dyspnea for the past 4 days, along with chest pain for the past 2 days. Substernal "sharp" with radiation to the left shoulder/ arm,  Fairly constant.    PT Comments    Patient received in bed, pleasant and willing to work with therapy today. Able to complete all mobility with levels of assist as below. Very impulsive during session requiring almost constant cuing for safety to maintain integrity of IV line as well as to avoid potentially hazardous environment with mobility. Seems to have poor safety awareness and poor awareness of deficits- was very surprised how much effort it took him to walk our distance today, and during gait itself did not turn around when PT instructed him to for energy management purposes. That being said, he was able to increase gait distance significantly today. He was left up in the chair with all needs met this afternoon.     Follow Up Recommendations  Home health PT     Equipment Recommendations  Rolling walker with 5" wheels;3in1 (PT)(might refuse)    Recommendations for Other Services       Precautions / Restrictions Precautions Precautions: Fall Precaution Comments: reports multiple falls Restrictions Weight Bearing Restrictions: No    Mobility  Bed Mobility Overal bed mobility: Modified Independent             General bed mobility comments: HOB slightly elevated, use of rail  Transfers Overall transfer level: Needs assistance Equipment used: Rolling walker (2 wheeled);None Transfers: Sit to/from Stand Sit to Stand: Supervision          General transfer comment: with and without IV pole- PT more there for safety and equipment management due to his impulsivity  Ambulation/Gait Ambulation/Gait assistance: Min guard Gait Distance (Feet): 200 Feet Assistive device: Rolling walker (2 wheeled) Gait Pattern/deviations: Step-through pattern;Decreased step length - right;Decreased step length - left;Trunk flexed Gait velocity: decreased   General Gait Details: less reliance on RW but very impulsive during gait, often taking one hand off of RW to fiddle with mask. Didn't follow directions well, kept going when PT instructed him to turn around to head back to room   Stairs             Wheelchair Mobility    Modified Rankin (Stroke Patients Only)       Balance Overall balance assessment: Needs assistance   Sitting balance-Leahy Scale: Normal     Standing balance support: Single extremity supported Standing balance-Leahy Scale: Fair Standing balance comment: fair static, poor dynamic                            Cognition Arousal/Alertness: Awake/alert Behavior During Therapy: WFL for tasks assessed/performed;Impulsive Overall Cognitive Status: Within Functional Limits for tasks assessed Area of Impairment: Safety/judgement                         Safety/Judgement: Decreased awareness of safety;Decreased awareness of deficits     General Comments: impulsive and required constant cuing during session for safety and extent of deficits  Exercises      General Comments General comments (skin integrity, edema, etc.): VSS, no signs of hypoxia with gait      Pertinent Vitals/Pain Pain Assessment: Faces Pain Score: 0-No pain Faces Pain Scale: No hurt Pain Intervention(s): Limited activity within patient's tolerance;Monitored during session    [name] [name] expects to be discharged to:: Private residence Living Arrangements: Children(step daughter) Available Help at  Discharge: Family;Available PRN/intermittently Type of Home: House Home Access: Stairs to enter Entrance Stairs-Rails: Left Home Layout: One level Home Equipment: Cane - single point Additional Comments: He reports he holds onto shower head for balance.    Prior Function Level of Independence: Independent with assistive device(s)      Comments: amb with cane   PT Goals (current goals can now be found in the care plan section) Acute Rehab PT Goals Patient Stated Goal: home using cane PT Goal Formulation: With patient Time For Goal Achievement: 01/20/20 Potential to Achieve Goals: Good Progress towards PT goals: Progressing toward goals    Frequency    Min 3X/week      PT Plan Current plan remains appropriate    Co-evaluation              AM-PAC PT "6 Clicks" Mobility   Outcome Measure  Help needed turning from your back to your side while in a flat bed without using bedrails?: None Help needed moving from lying on your back to sitting on the side of a flat bed without using bedrails?: None Help needed moving to and from a bed to a chair (including a wheelchair)?: A Little Help needed standing up from a chair using your arms (e.g., wheelchair or bedside chair)?: A Little Help needed to walk in hospital room?: A Little Help needed climbing 3-5 steps with a railing? : A Little 6 Click Score: 20    End of Session   Activity Tolerance: Patient tolerated treatment well Patient left: in chair;with call bell/phone within reach   PT Visit Diagnosis: Unsteadiness on feet (R26.81);Difficulty in walking, not elsewhere classified (R26.2)     Time: 9070-7217 PT Time Calculation (min) (ACUTE ONLY): 12 min  Charges:  $Gait Training: 8-22 mins                     Windell Norfolk, DPT, PN1   Supplemental Physical Therapist [place]    Pager [phone] Acute Rehab Office [phone]

## 2020-01-07 NOTE — Progress Notes (Addendum)
Inpatient Diabetes Program Recommendations  AACE/ADA: New Consensus Statement on Inpatient Glycemic Control (2015)  Target Ranges:  Prepandial:   less than 140 mg/dL      Peak postprandial:   less than 180 mg/dL (1-2 hours)      Critically ill patients:  140 - 180 mg/dL   Results for [name] [name], [name] [name] (MRN [id number]) as of 01/07/2020 11:16  Ref. Range 01/06/2020 00:41 01/06/2020 05:07 01/06/2020 07:43 01/06/2020 11:39 01/06/2020 17:03 01/06/2020 21:26  Glucose-Capillary Latest Ref Range: 70 - 99 mg/dL 266 (H)  5 units NOVOLOG  216 (H)  3 units NOVOLOG +  20 units LANTUS  154 (H)  2 units NOVOLOG  330 (H)  7 units NOVOLOG +  10 units LANTUS  339 (H)  7 units NOVOLOG  272 (H)  5 units NOVOLOG    Results for [name] [name], [name] [name] (MRN [id number]) as of 01/07/2020 11:16  Ref. Range 01/07/2020 00:40 01/07/2020 04:19 01/07/2020 07:56  Glucose-Capillary Latest Ref Range: 70 - 99 mg/dL 255 (H)  5 units NOVOLOG  209 (H)  5 units NOVOLOG  251 (H)  5 units NOVOLOG +  35 units LANTUS    Results for [name] [name], [name] [name] (MRN [id number]) as of 01/07/2020 11:16  Ref. Range 01/06/2020 05:32  Hemoglobin A1C Latest Ref Range: 4.8 - 5.6 % 11.0 (H)  (269 mg/dl)    Admit with: CP/ Elevated Troponin/ Chronic systolic and diastolic heart failure  History: DM, CKD, CHF  Home DM Meds: Novolog 12 units TID       Basaglar 50 units BID       Metformin 500 mg BID  Current Orders: Lantus 35 units Daily      Novolog Sensitive Correction Scale/ SSI (0-9 units) Q4 hours     Hospice at home--Was not getting any insulin for a period of time due to being on Hospice care??--Stated he has been taking insulin and stopped Hospice care (taking insulin inconsistently due to affordability)  Current A1c = 11%    MD- Note Lantus increased to 35 units Daily this AM.  May also consider the following:  1. Change/Increase Novolog SSi to Moderate scale (0-15 units) TID AC + HS  2. Increase Lantus  to 50 units Daily (50% home dose)  3. Start Novolog Meal Coverage: Novolog 6 units TID with meals  (Please add the following Hold Parameters: Hold if pt eats <50% of meal, Hold if pt NPO)     Addendum 12:30pm--  Met w/ pt at beside today.  Spoke with patient about his current A1c of 11%.  Explained what an A1c is and what it measures.  Reminded patient that his goal A1c is 7-8%% per ADA standards to prevent both acute and long-term complications.  Explained to patient the extreme importance of good glucose control at home.  Encouraged patient to check his CBGs at least TID AC at home and to record all CBGs in a logbook for his PCP to review.  Pt told me he has trouble affording all his meds.  Stated he was on Hospice but stopped Hospice care.  Has been taking Insulin but inconsistently due to having trouble affording.  Told me he just got his Lantus refilled and the co-pay was $45.  Concerned about med costs b/c he takes such a large amount of meds.  Placed TOC consult to see if pt can get any assistance with meds.  Has appt with his PCP [facility]) tomorrow but already called to cancel since he  is in hospital.  Plans to reschedule after d/c.       --Will follow patient during hospitalization--  [name] Quaker RN, MSN, CDE Diabetes Coordinator Inpatient Glycemic Control Team Team Pager: [phone] (8a-5p)

## 2020-01-07 NOTE — Discharge Instructions (Signed)

## 2020-01-08 ENCOUNTER — Encounter (HOSPITAL_COMMUNITY): Payer: Self-pay | Admitting: Internal Medicine

## 2020-01-08 ENCOUNTER — Other Ambulatory Visit: Payer: Self-pay | Admitting: Physician Assistant

## 2020-01-08 DIAGNOSIS — I255 Ischemic cardiomyopathy: Secondary | ICD-10-CM

## 2020-01-08 LAB — CBC
HCT: 37.6 % — ABNORMAL LOW (ref 39.0–52.0)
Hemoglobin: 13 g/dL (ref 13.0–17.0)
MCH: 31 pg (ref 26.0–34.0)
MCHC: 34.6 g/dL (ref 30.0–36.0)
MCV: 89.5 fL (ref 80.0–100.0)
Platelets: 173 10*3/uL (ref 150–400)
RBC: 4.2 MIL/uL — ABNORMAL LOW (ref 4.22–5.81)
RDW: 13.2 % (ref 11.5–15.5)
WBC: 5.5 10*3/uL (ref 4.0–10.5)
nRBC: 0 % (ref 0.0–0.2)

## 2020-01-08 LAB — BASIC METABOLIC PANEL
Anion gap: 8 (ref 5–15)
BUN: 25 mg/dL — ABNORMAL HIGH (ref 8–23)
CO2: 25 mmol/L (ref 22–32)
Calcium: 9 mg/dL (ref 8.9–10.3)
Chloride: 102 mmol/L (ref 98–111)
Creatinine, Ser: 1.64 mg/dL — ABNORMAL HIGH (ref 0.61–1.24)
GFR calc Af Amer: 48 mL/min — ABNORMAL LOW (ref >60)
GFR calc non Af Amer: 42 mL/min — ABNORMAL LOW (ref >60)
Glucose, Bld: 265 mg/dL — ABNORMAL HIGH (ref 70–99)
Potassium: 3.7 mmol/L (ref 3.5–5.1)
Sodium: 135 mmol/L (ref 135–145)

## 2020-01-08 LAB — GLUCOSE, CAPILLARY
Glucose-Capillary: 178 mg/dL — ABNORMAL HIGH (ref 70–99)
Glucose-Capillary: 223 mg/dL — ABNORMAL HIGH (ref 70–99)
Glucose-Capillary: 253 mg/dL — ABNORMAL HIGH (ref 70–99)

## 2020-01-08 LAB — HEPARIN LEVEL (UNFRACTIONATED): Heparin Unfractionated: 0.54 IU/mL (ref 0.30–0.70)

## 2020-01-08 MED ORDER — METOPROLOL SUCCINATE ER 25 MG PO TB24: 25.0000 mg | ORAL_TABLET | Freq: Every day | ORAL | 0 refills | Status: DC

## 2020-01-08 MED ORDER — TAMSULOSIN HCL 0.4 MG PO CAPS: 0.4000 mg | ORAL_CAPSULE | Freq: Every day | ORAL | 0 refills | Status: AC

## 2020-01-08 MED ORDER — ATORVASTATIN CALCIUM 40 MG PO TABS: 40.0000 mg | ORAL_TABLET | Freq: Every day | ORAL | 0 refills | Status: AC

## 2020-01-08 MED ORDER — NOVOLOG FLEXPEN 100 UNIT/ML ~~LOC~~ SOPN: 6.0000 [IU] | PEN_INJECTOR | Freq: Three times a day (TID) | SUBCUTANEOUS | 0 refills | Status: AC

## 2020-01-08 MED ORDER — AMIODARONE HCL 200 MG PO TABS: 200.0000 mg | ORAL_TABLET | Freq: Every day | ORAL | 0 refills | Status: DC

## 2020-01-08 MED ORDER — BASAGLAR KWIKPEN 100 UNIT/ML ~~LOC~~ SOPN: 50.0000 [IU] | PEN_INJECTOR | Freq: Every day | SUBCUTANEOUS | 1 refills | Status: AC

## 2020-01-08 MED ORDER — INSULIN GLARGINE 100 UNIT/ML ~~LOC~~ SOLN
50.0000 [IU] | Freq: Every day | SUBCUTANEOUS | Status: DC
  Administered 2020-01-08: 50 [IU] via SUBCUTANEOUS
  Filled 2020-01-08: qty 0.5

## 2020-01-08 MED ORDER — INSULIN ASPART 100 UNIT/ML ~~LOC~~ SOLN: 6.0000 [IU] | Freq: Three times a day (TID) | SUBCUTANEOUS | Status: DC

## 2020-01-08 MED ORDER — ALPRAZOLAM 1 MG PO TABS: 1.0000 mg | ORAL_TABLET | Freq: Two times a day (BID) | ORAL | 0 refills | Status: AC | PRN

## 2020-01-08 MED ORDER — PANTOPRAZOLE SODIUM 40 MG PO TBEC: 40.0000 mg | DELAYED_RELEASE_TABLET | Freq: Every day | ORAL | 1 refills | Status: AC

## 2020-01-08 MED ORDER — NITROGLYCERIN 0.4 MG SL SUBL: 0.4000 mg | SUBLINGUAL_TABLET | SUBLINGUAL | 0 refills | Status: AC | PRN

## 2020-01-08 MED ORDER — APIXABAN 5 MG PO TABS: 5.0000 mg | ORAL_TABLET | Freq: Two times a day (BID) | ORAL | 1 refills | Status: AC

## 2020-01-08 NOTE — Discharge Summary (Signed)
Physician Discharge Summary  Esteban Giffin Hinze [id number] DOB: 06/02/1951 DOA: 01/04/2020  PCP: [name], PA  Admit date: 01/04/2020 Discharge date: 01/08/2020  Time spent: 55 minutes  Recommendations for Outpatient Follow-up:  1. Follow-up with [name], PA in 1 week.  On follow-up patient need a basic metabolic profile to follow-up on electrolytes and renal function. 2. Follow-up with Dr. Rayann Heman, EP.  Cardiology will call with appointment time. 3. Follow-up with Dr. Peter Martinique, cardiology in 1 to 2 weeks. 4. Patient will discharge home with home health therapies.   Discharge Diagnoses:  Principal Problem:   Chest pain Active Problems:   CAD (coronary artery disease)   Syncope   DM (diabetes mellitus), type 2 (HCC)   Diarrhea   Abdominal pain   Acute on chronic combined systolic and diastolic CHF (congestive heart failure) (HCC)   Benign prostatic hyperplasia   Discharge Condition: Stable and improved  Diet recommendation: Heart healthy  Filed Weights   01/06/20 0500 01/07/20 0421 01/08/20 0453  Weight: 104.8 kg 105.7 kg 105.9 kg    History of present illness:  HPI per Dr. Linzie Collin  is a 69 y.o. male,  w hypertension, hyperlipidemia, Dm2, CKD stage3, CAD s/p BMS to LAD 2000, PTCA 2nd diag 2010, DES LAD and DES OM1 2016, Mitral valve prolapse/ moderate MR, h/o MVR 2004, CHF (EF 25-30%) s/p AICD, presents with dyspnea for the past 4 days, along with chest pain for the past 2 days. Substernal "sharp" with radiation to the left shoulder/ arm,  Fairly constant.  Pt denied headache, alteration in sense of taste or smell, fever, chills, cough, palp, n/v, diarrhea, brbpr, black stool, dysuria, hematuria.  Pt noted ?syncope to ED physician  Pt was on hospice but was having difficulty getting his medication and presented to ED for evaluation    In ED,  T 97.5, P 94, R 16, Bp 143/98  Pox 98% on RA Wt 108.9kg   CT brain  IMPRESSION: 1. No  acute intracranial hemorrhage. 2. No acute/traumatic cervical spine pathology.  CXR IMPRESSION: 1. Evidence of prior median sternotomy. 2. Mild right basilar atelectasis and/or infiltrate. 3. Stable cardiomegaly.   Trop 68->  100-> 149  Wbc 7.7, Hgb 14.4, Plt 202 Na 124, K 4.5, Bun 23, Creatinine 1.45 ASt 25, Alt 23 Glucose 627 Hco3 20, AG 13 BNP 165.2 INR 1.4  FOBT negative Urinalysis negative   ED consulted cardiology and they will follow, no STEMI per ED/ Cardiology   Pt admitted for chest pain ? Syncope and hyperglycemia    Hospital Course:  1 chest pain Patient presented with chest pain noted to have elevated troponins as high as 149.  Patient noted to have been on hospice and after discharge from hospice not on any of his cardiac medications since then.  2D echo a EF of 25 to 30%, mildly increased LVH, left ventricular global hypokinesis, severely dilated left atrium, mildly dilated right atrium, mild mitral valve regurgitation, mitral valve repair/replaced.  Patient maintained on aspirin, Lipitor, oral Lasix, beta-blocker, IV heparin.  Patient was followed by cardiology during her hospitalization.  Patient improved clinically and was felt per cardiology no further ischemic work-up needed during this hospitalization.  Patient will follow up with cardiology in the outpatient setting.   2.?? Syncope Per admitting physician ED physician did state patient had a syncopal episode.  Patient also noted to have had multiple episodes of diarrhea prior to admission.  Concern for possible volume depletion.  Patient not  been on any medications since discharge from hospice.  High-sensitivity troponins were rising/elevated.  Patient started on a heparin drip per cardiology.  [name] device interrogated per cardiology.  2D echo with a EF of 25 to 30%, mildly increased LVH, left ventricular global hypokinesis, severely dilated left atrium, mildly dilated right atrium, mild  mitral valve regurgitation, mitral valve repair/replaced.   Patient was followed by cardiology and will follow up with cardiology in the outpatient setting.   3.  Chronic combined systolic and diastolic heart failure/status post mitral valve repair Patient  had presented with shortness of breath on exertion as well as chest tightness which improved during the hospitalization. Patient did not look volume overloaded on examination.  Patient noted have been off his cardiac medications after being placed in hospice.  On admission patient noted to, bouts of diarrhea and likely volume depleted.  2D echo with a EF of 25 to 30%, mildly increased LVH, left ventricular global hypokinesis, severely dilated left atrium, mildly dilated right atrium, mild mitral valve regurgitation, mitral valve repair/replaced.    Patient maintained on oral Lasix and dose doubled as patient had complaints of shortness of breath on exertion.  Patient had good urine output and was -1.552 L during the hospitalization.  Patient also maintained on Toprol-XL as well as aspirin and Lipitor.  Patient improved clinically and will follow up in the outpatient setting with cardiology.  Patient was followed by cardiology during the hospitalization.  4.  Diarrhea C. difficile PCR was ordered.  Patient however did not have any further loose stools during the hospitalization.  Patient had one soft stool on the day of discharge.   5.  Persistent atrial fibrillation/status post mitral valve repair Patient noted to be in A. fib and irregularly irregular rate during the hospitalization.  2D echo with a EF of 25 to 30%, mildly increased LVH, left ventricular global hypokinesis, severely dilated left atrium, mildly dilated right atrium, mild mitral valve regurgitation, mitral valve repair/replaced.  Patient maintained on Toprol-XL and amiodarone for rate control.  Patient initially placed on IV heparin pending cardiology evaluation.  Cardiology followed  patient throughout the hospitalization and initially recommended patient be placed back on Coumadin and subsequently decided to place patient on Eliquis.  Outpatient follow-up with EP and cardiology.  6.  Poorly controlled diabetes mellitus type 2 Hemoglobin A1c 11.0.  Patient noted to have initially been on hospice however no longer on hospice and since then has not received any of his medications or been able to fill them including his insulin.  Oral hypoglycemic agents were held during the hospitalization discontinued on discharge.  Patient was placed on Lantus insulin dose up titrated to 15 units daily by day of discharge.  Patient also maintained on sliding scale insulin.  Outpatient follow-up.   7.  Gastroesophageal reflux disease Patient maintained on a PPI.   8.  BPH Patient maintained on home regimen of Flomax.   9.  Anxiety Patient maintained on Xanax as needed.  Procedures:  CT head CT/ C-spine 01/04/2020  Abdominal films pending 01/05/2020  Chest x-ray 01/04/2020  Carotid ultrasound 01/05/2020  2D echo 01/06/2020   Consultations:  Cardiology: Dr. Gardiner Rhyme 01/05/2020   Discharge Exam: Vitals:   01/07/20 2020 01/08/20 0453  BP: 108/82 126/86  Pulse: 84 80  Resp: 18 (!) 8  Temp: (!) 97.5 F (36.4 C) 98 F (36.7 C)  SpO2: 100% 96%    General: NAD Cardiovascular: Irregularly irregular Respiratory: CTA B  Discharge Instructions  Discharge Instructions    Diet - low sodium heart healthy   Complete by: As directed    Increase activity slowly   Complete by: As directed      Allergies as of 01/08/2020      Reactions   Liraglutide Nausea And Vomiting   Lisinopril Cough      Medication List    STOP taking these medications   aspirin EC 81 MG tablet   carbamazepine 200 MG 12 hr tablet Commonly known as: TEGRETOL XR   carvedilol 25 MG tablet Commonly known as: COREG   furosemide 40 MG tablet Commonly known as: LASIX   metFORMIN 500 MG 24 hr  tablet Commonly known as: GLUCOPHAGE-XR   metoprolol tartrate 25 MG tablet Commonly known as: LOPRESSOR   omeprazole 40 MG capsule Commonly known as: PRILOSEC Replaced by: pantoprazole 40 MG tablet   pravastatin 20 MG tablet Commonly known as: PRAVACHOL   valsartan 160 MG tablet Commonly known as: Diovan   valsartan 40 MG tablet Commonly known as: DIOVAN   warfarin 1 MG tablet Commonly known as: COUMADIN     TAKE these medications   ALPRAZolam 1 MG tablet Commonly known as: XANAX Take 1 tablet (1 mg total) by mouth 2 (two) times daily as needed for anxiety.   amiodarone 200 MG tablet Commonly known as: PACERONE Take 1 tablet (200 mg total) by mouth daily. <PLEASE MAKE APPOINTMENT FOR REFILLS>   apixaban 5 MG Tabs tablet Commonly known as: ELIQUIS Take 1 tablet (5 mg total) by mouth 2 (two) times daily.   atorvastatin 40 MG tablet Commonly known as: LIPITOR Take 1 tablet (40 mg total) by mouth daily at 6 PM. What changed:   medication strength  how much to take   Basaglar KwikPen 100 UNIT/ML Sopn Inject 0.5 mLs (50 Units total) into the skin daily. What changed: when to take this   metoprolol succinate 25 MG 24 hr tablet Commonly known as: TOPROL-XL Take 1 tablet (25 mg total) by mouth daily. Start taking on: January 09, 2020   nitroGLYCERIN 0.4 MG SL tablet Commonly known as: NITROSTAT Place 1 tablet (0.4 mg total) under the tongue every 5 (five) minutes x 3 doses as needed for chest pain.   NovoLOG FlexPen 100 UNIT/ML FlexPen Generic drug: insulin aspart Inject 6 Units into the skin 3 (three) times daily with meals. What changed: how much to take   pantoprazole 40 MG tablet Commonly known as: PROTONIX Take 1 tablet (40 mg total) by mouth daily. Start taking on: January 09, 2020 Replaces: omeprazole 40 MG capsule   tamsulosin 0.4 MG Caps capsule Commonly known as: FLOMAX Take 1 capsule (0.4 mg total) by mouth daily.            Durable  Medical Equipment  (From admission, onward)         Start     Ordered   01/07/20 0745  For home use only DME 4 wheeled rolling walker with seat  Once    Question:  Patient needs a walker to treat with the following condition  Answer:  Debility   01/07/20 0745   01/07/20 0745  For home use only DME 3 n 1  Once     01/07/20 0745         Allergies  Allergen Reactions  . Liraglutide Nausea And Vomiting  . Lisinopril Cough   Follow-up Information    [name], [place]. Schedule an appointment as soon as possible for a visit  in 1 week(s).   Specialty: Physician Assistant Contact information: [place] [address] [phone]         Grayer, MD .   Specialty: Cardiology Contact information: [address] [place] [address] [phone]        Martinique, Peter M, MD. Schedule an appointment as soon as possible for a visit in 2 week(s).   Specialty: Cardiology Why: f/u in 1-2 weeks. Contact information: [place] [address] [address] [phone]            The results of significant diagnostics from this hospitalization (including imaging, microbiology, ancillary and laboratory) are listed below for reference.    Significant Diagnostic Studies: CT Head Wo Contrast  Result Date: 01/04/2020 CLINICAL DATA:  69 year old male with head trauma. EXAM: CT HEAD WITHOUT CONTRAST CT CERVICAL SPINE WITHOUT CONTRAST TECHNIQUE: Multidetector CT imaging of the head and cervical spine was performed following the standard protocol without intravenous contrast. Multiplanar CT image reconstructions of the cervical spine were also generated. COMPARISON:  Head CT dated 09/27/2016. FINDINGS: CT HEAD FINDINGS Brain: There is mild age-related atrophy and chronic microvascular ischemic changes. There is no acute intracranial hemorrhage. No mass effect or shift no extra-axial fluid collection. Vascular: No hyperdense vessel or  unexpected calcification. Skull: Normal. Negative for fracture or focal lesion. Sinuses/Orbits: Mild mucoperiosteal thickening of paranasal sinuses. No air-fluid level. The mastoid air cells are clear. Other: None CT CERVICAL SPINE FINDINGS Alignment: No acute subluxation. Skull base and vertebrae: No acute fracture.  Osteopenia. Soft tissues and spinal canal: No prevertebral fluid or swelling. No visible canal hematoma. Disc levels: No acute findings. C5-C6 ankylosis. Degenerative changes primarily at C6-C7 with endplate irregularity and disc space narrowing and spurring. There is narrowing the left C6-C7 neural foramina. Upper chest: Negative. Other: Bilateral carotid bulb calcified plaques. IMPRESSION: 1. No acute intracranial hemorrhage. 2. No acute/traumatic cervical spine pathology. Electronically Signed   By: Anner Crete M.D.   On: 01/04/2020 21:13   CT Cervical Spine Wo Contrast  Result Date: 01/04/2020 CLINICAL DATA:  69 year old male with head trauma. EXAM: CT HEAD WITHOUT CONTRAST CT CERVICAL SPINE WITHOUT CONTRAST TECHNIQUE: Multidetector CT imaging of the head and cervical spine was performed following the standard protocol without intravenous contrast. Multiplanar CT image reconstructions of the cervical spine were also generated. COMPARISON:  Head CT dated 09/27/2016. FINDINGS: CT HEAD FINDINGS Brain: There is mild age-related atrophy and chronic microvascular ischemic changes. There is no acute intracranial hemorrhage. No mass effect or shift no extra-axial fluid collection. Vascular: No hyperdense vessel or unexpected calcification. Skull: Normal. Negative for fracture or focal lesion. Sinuses/Orbits: Mild mucoperiosteal thickening of paranasal sinuses. No air-fluid level. The mastoid air cells are clear. Other: None CT CERVICAL SPINE FINDINGS Alignment: No acute subluxation. Skull base and vertebrae: No acute fracture.  Osteopenia. Soft tissues and spinal canal: No prevertebral fluid or  swelling. No visible canal hematoma. Disc levels: No acute findings. C5-C6 ankylosis. Degenerative changes primarily at C6-C7 with endplate irregularity and disc space narrowing and spurring. There is narrowing the left C6-C7 neural foramina. Upper chest: Negative. Other: Bilateral carotid bulb calcified plaques. IMPRESSION: 1. No acute intracranial hemorrhage. 2. No acute/traumatic cervical spine pathology. Electronically Signed   By: Anner Crete M.D.   On: 01/04/2020 21:13   DG Chest Portable 1 View  Result Date: 01/04/2020 CLINICAL DATA:  Chest pain. EXAM: PORTABLE CHEST 1 VIEW COMPARISON:  March 03, 2018 FINDINGS: There is a  dual lead AICD. Multiple sternal wires are noted. Numerous overlying cardiac lead wires are seen. Mild atelectasis and/or infiltrate is seen within the right lung base. There is no evidence of a pleural effusion or pneumothorax. There is moderate to marked severity are gin of the cardiac silhouette. There is mild calcification of the aortic arch. The visualized skeletal structures are unremarkable. IMPRESSION: 1. Evidence of prior median sternotomy. 2. Mild right basilar atelectasis and/or infiltrate. 3. Stable cardiomegaly. Electronically Signed   By: Virgina Norfolk M.D.   On: 01/04/2020 17:51   DG Abd 2 Views  Result Date: 01/05/2020 CLINICAL DATA:  Abdominal pain EXAM: ABDOMEN - 2 VIEW COMPARISON:  None. FINDINGS: Overall bowel gas pattern is nonobstructive. No evidence of soft tissue mass or abnormal fluid collection. No evidence of free intraperitoneal air. No evidence of renal or ureteral calculi. Vascular calcifications noted in the pelvis. Lung bases appear clear. Visualized osseous structures are unremarkable. IMPRESSION: Nonobstructive bowel gas pattern and no evidence of acute intra-abdominal abnormality. Electronically Signed   By: Franki Cabot M.D.   On: 01/05/2020 12:32   ECHOCARDIOGRAM COMPLETE  Result Date: 01/06/2020   ECHOCARDIOGRAM REPORT   Patient  Name:   YANICK SYLTE [facility] Date of Exam: 01/06/2020 Medical Rec #:  [id number]           Height:       72.0 in Accession #:    [id number]          Weight:       231.1 lb Date of Birth:  [birth year]           BSA:          2.27 m Patient Age:    39 years            BP:           100/71 mmHg Patient Gender: M                   HR:           83 bpm. Exam Location:  Inpatient Procedure: 2D Echo Indications:    Chest Pain R07.9  History:        Patient has prior history of Echocardiogram examinations, most                 recent 12/03/2016. CAD, MV Annuloplasty, Arrythmias:Atrial                 Fibrillation; Risk Factors:Diabetes, Dyslipidemia and                 Hypertension.  Sonographer:    Mikki Santee RDCS (AE) Referring Phys: [place]  1. Left ventricular ejection fraction, by visual estimation, is 25 to 30%. The left ventricle has severely decreased function. There is mildly increased left ventricular hypertrophy.  2. The left ventricle demonstrates global hypokinesis.  3. Global right ventricle has mildly reduced systolic function.The right ventricular size is normal. No increase in right ventricular wall thickness.  4. Left atrial size was severely dilated.  5. Right atrial size was mildly dilated.  6. The mitral valve has been repaired/replaced. Mild mitral valve regurgitation.  7. The tricuspid valve is grossly normal.  8. The tricuspid valve is grossly normal. Tricuspid valve regurgitation is mild.  9. The aortic valve is tricuspid. Aortic valve regurgitation is not visualized. 10. The pulmonic valve was grossly normal. Pulmonic valve regurgitation is trivial. 11. Aortic dilatation noted. 12. There is mild dilatation of  the aortic root and of the ascending aorta. 13. Normal pulmonary artery systolic pressure. 14. A pacer wire is visualized. 15. The inferior vena cava is normal in size with greater than 50% respiratory variability, suggesting right atrial pressure of 3 mmHg. FINDINGS   Left Ventricle: Left ventricular ejection fraction, by visual estimation, is 25 to 30%. The left ventricle has severely decreased function. The left ventricle demonstrates global hypokinesis. The left ventricular internal cavity size was the left ventricle is normal in size. There is mildly increased left ventricular hypertrophy. Concentric left ventricular hypertrophy. Right Ventricle: The right ventricular size is normal. No increase in right ventricular wall thickness. Global RV systolic function is has mildly reduced systolic function. The tricuspid regurgitant velocity is 1.71 m/s, and with an assumed right atrial pressure of 3 mmHg, the estimated right ventricular systolic pressure is normal at 14.7 mmHg. Left Atrium: Left atrial size was severely dilated. Right Atrium: Right atrial size was mildly dilated Pericardium: There is no evidence of pericardial effusion. Mitral Valve: The mitral valve has been repaired/replaced. Mild mitral valve regurgitation. MV peak gradient, 7.8 mmHg. Tricuspid Valve: The tricuspid valve is grossly normal. Tricuspid valve regurgitation is mild. Aortic Valve: The aortic valve is tricuspid. Aortic valve regurgitation is not visualized. Pulmonic Valve: The pulmonic valve was grossly normal. Pulmonic valve regurgitation is trivial. Pulmonic regurgitation is trivial. Aorta: Aortic dilatation noted. There is mild dilatation of the aortic root and of the ascending aorta. Venous: The inferior vena cava is normal in size with greater than 50% respiratory variability, suggesting right atrial pressure of 3 mmHg. IAS/Shunts: No atrial level shunt detected by color flow Doppler. Additional Comments: A pacer wire is visualized.  LEFT VENTRICLE PLAX 2D LVIDd:         5.80 cm LVIDs:         5.00 cm LV PW:         1.30 cm LV IVS:        1.30 cm LVOT diam:     2.40 cm LV SV:         48 ml LV SV Index:   20.69 LVOT Area:     4.52 cm  LEFT ATRIUM              Index       RIGHT ATRIUM            Index LA diam:        5.00 cm  2.21 cm/m  RA Area:     23.20 cm LA Vol (A2C):   108.0 ml 47.67 ml/m RA Volume:   63.70 ml  28.12 ml/m LA Vol (A4C):   124.0 ml 54.73 ml/m LA Biplane Vol: 126.0 ml 55.62 ml/m  AORTIC VALVE LVOT Vmax:   71.70 cm/s LVOT Vmean:  47.400 cm/s LVOT VTI:    0.121 m  AORTA Ao Root diam: 4.00 cm MITRAL VALVE              TRICUSPID VALVE MV Area (PHT): 2.89 cm   TR Peak grad:   11.7 mmHg MV Peak grad:  7.8 mmHg   TR Vmax:        198.00 cm/s MV Mean grad:  3.0 mmHg MV Vmax:       1.39 m/s   SHUNTS MV Vmean:      77.9 cm/s  Systemic VTI:  0.12 m MV VTI:        0.32 m     Systemic Diam: 2.40 cm MV PHT:  76.00 msec MR Peak grad: 80.3 mmHg MR Mean grad: 50.0 mmHg MR Vmax:      448.00 cm/s MR Vmean:     335.0 cm/s  Kate Sable MD Electronically signed by Kate Sable MD Signature Date/Time: 01/06/2020/12:58:17 PM    Final    VAS US CAROTID  Result Date: 01/07/2020 Carotid Arterial Duplex Study Indications:       Syncope. Comparison Study:  No prior study. Performing Technologist: Maudry Mayhew MHA, RDMS, RVT, RDCS  Examination Guidelines: A complete evaluation includes B-mode imaging, spectral Doppler, color Doppler, and power Doppler as needed of all accessible portions of each vessel. Bilateral testing is considered an integral part of a complete examination. Limited examinations for reoccurring indications may be performed as noted.  Right Carotid Findings: +----------+-------+-------+--------+---------------------------------+--------+           PSV    EDV    StenosisPlaque Description               Comments           cm/s   cm/s                                                     +----------+-------+-------+--------+---------------------------------+--------+ CCA Prox  82     11                                                       +----------+-------+-------+--------+---------------------------------+--------+ CCA Distal55     10                                                        +----------+-------+-------+--------+---------------------------------+--------+ ICA Prox  36     10             heterogenous, irregular and                                               calcific                                  +----------+-------+-------+--------+---------------------------------+--------+ ICA Distal84     30                                                       +----------+-------+-------+--------+---------------------------------+--------+ ECA       55     12             smooth and heterogenous                   +----------+-------+-------+--------+---------------------------------+--------+ +----------+--------+-------+----------------+-------------------+           PSV cm/sEDV cmsDescribe        Arm Pressure (  mmHG) +----------+--------+-------+----------------+-------------------+ [id number]             Multiphasic, WNL                    +----------+--------+-------+----------------+-------------------+ +---------+--------+--+--------+-+---------+ VertebralPSV cm/s25EDV cm/s8Antegrade +---------+--------+--+--------+-+---------+  Left Carotid Findings: +----------+--------+--------+--------+--------------------------+--------+           PSV cm/sEDV cm/sStenosisPlaque Description        Comments +----------+--------+--------+--------+--------------------------+--------+ CCA Prox  79      18              heterogenous and smooth            +----------+--------+--------+--------+--------------------------+--------+ CCA Distal54      13              heterogenous and irregular         +----------+--------+--------+--------+--------------------------+--------+ ICA Prox  47      18              heterogenous and irregular         +----------+--------+--------+--------+--------------------------+--------+ ICA Distal73      33                                                  +----------+--------+--------+--------+--------------------------+--------+ ECA       83      22              smooth and heterogenous            +----------+--------+--------+--------+--------------------------+--------+ +----------+--------+--------+--------------+-------------------+           PSV cm/sEDV cm/sDescribe      Arm Pressure (mmHG) +----------+--------+--------+--------------+-------------------+ Subclavian                Not identified                    +----------+--------+--------+--------------+-------------------+ +---------+--------+--+--------+--+---------+ VertebralPSV cm/s32EDV cm/s11Antegrade +---------+--------+--+--------+--+---------+   Summary: Right Carotid: Velocities in the right ICA are consistent with a 1-39% stenosis. Left Carotid: Velocities in the left ICA are consistent with a 1-39% stenosis. Vertebrals:  Bilateral vertebral arteries demonstrate antegrade flow. Subclavians: Left subclavian artery was not visualized. Normal flow hemodynamics              were seen in the right subclavian artery. *See table(s) above for measurements and observations.  Electronically signed by Monica Martinez MD on 01/07/2020 at 4:35:38 PM.    Final     Microbiology: Recent Results (from the past 240 hour(s))  SARS CORONAVIRUS 2 (TAT 6-24 HRS) Nasopharyngeal Nasopharyngeal Swab     Status: None   Collection Time: 01/04/20  8:40 PM   Specimen: Nasopharyngeal Swab  Result Value Ref Range Status   SARS Coronavirus 2 NEGATIVE NEGATIVE Final    Comment: (NOTE) SARS-CoV-2 target nucleic acids are NOT DETECTED. The SARS-CoV-2 RNA is generally detectable in upper and lower respiratory specimens during the acute phase of infection. Negative results do not preclude SARS-CoV-2 infection, do not rule out co-infections with other pathogens, and should not be used as the sole basis for treatment or other patient management decisions. Negative results must be combined  with clinical observations, patient history, and epidemiological information. The expected result is Negative. Fact Sheet for Patients: SugarRoll.be Fact Sheet for Healthcare Providers: https://www.woods-mathews.com/ This test is not yet approved or cleared by the [place] FDA and  has been authorized for detection and/or diagnosis  of SARS-CoV-2 by FDA under an Emergency Use Authorization (EUA). This EUA will remain  in effect (meaning this test can be used) for the duration of the COVID-19 declaration under Section 56 4(b)(1) of the Act, 21 U.S.C. section 360bbb-3(b)(1), unless the authorization is terminated or revoked sooner. Performed at [facility], [place] [address] [place], [place] [address]      Labs: Basic Metabolic Panel: Recent Labs  Lab 01/05/20 0026 01/05/20 0712 01/06/20 0532 01/07/20 0831 01/08/20 0505  NA 134* 136 138 136 135  K 3.7 3.7 4.3 4.2 3.7  CL 102 105 104 102 102  CO2 23 20* 25 24 25   GLUCOSE 279* 214* 234* 391* 265*  BUN 21 20 18 17  25*  CREATININE 1.34* 1.25* 1.20 1.59* 1.64*  CALCIUM 8.9 8.8* 9.2 8.8* 9.0  MG  --   --  1.9 1.8  --    Liver Function Tests: Recent Labs  Lab 01/04/20 1642 01/05/20 0712 01/06/20 0532  AST 25 22 23   ALT 23 21 22   ALKPHOS 87 83 81  BILITOT 1.3* 0.7 0.8  PROT 6.6 5.7* 6.1*  ALBUMIN 3.9 3.4* 3.5   Recent Labs  Lab 01/05/20 0712  LIPASE 22   No results for input(s): AMMONIA in the last 168 hours. CBC: Recent Labs  Lab 01/04/20 1642 01/04/20 1642 01/04/20 1655 01/05/20 0712 01/06/20 0532 01/07/20 0831 01/08/20 0505  WBC 7.7  --   --  5.8 5.6 5.0 5.5  NEUTROABS 6.3  --   --   --   --   --   --   HGB 14.4   < > 14.6 14.0 14.3 13.6 13.0  HCT 42.0   < > 43.0 40.8 42.5 40.2 37.6*  MCV 90.5  --   --  89.5 90.6 91.4 89.5  PLT 202  --   --  174 204 199 173   < > = values in this interval not displayed.   Cardiac Enzymes: No results for input(s):  CKTOTAL, CKMB, CKMBINDEX, TROPONINI in the last 168 hours. BNP: BNP (last 3 results) Recent Labs    01/04/20 1642  BNP 165.2*    ProBNP (last 3 results) No results for input(s): PROBNP in the last 8760 hours.  CBG: Recent Labs  Lab 01/07/20 1133 01/07/20 1619 01/08/20 0004 01/08/20 0450 01/08/20 0725  GLUCAP 308* 248* 178* 253* 223*       Signed:  Irine Seal MD.  [facility] 01/08/2020, 11:18 AM

## 2020-01-08 NOTE — Progress Notes (Signed)
Pt discharge instructions reviewed with pt.  Pt verbalizes understanding and states he has no questions.  Pt belongings with pt. Pt is not in distress. Pt discharged via wheelchair.

## 2020-01-08 NOTE — Care Management Important Message (Signed)
Important Message  Patient Details  Name: [name] [name] MRN: [id number] Date of Birth: 09-22-1951   Medicare Important Message Given:  Yes     [name] 01/08/2020, 11:25 AM

## 2020-01-08 NOTE — Progress Notes (Signed)
ANTICOAGULATION CONSULT NOTE  Pharmacy Consult for heparin Indication: chest pain/ACS  Patient Measurements: Height: 6' (182.9 cm) Weight: 233 lb 6.4 oz (105.9 kg)(scale a) IBW/kg (Calculated) : 77.6 Heparin Dosing Weight: 100 kg  Vital Signs: Temp: 98 F (36.7 C) (01/26 0453) Temp Source: Oral (01/26 0453) BP: 126/86 (01/26 0453) Pulse Rate: 80 (01/26 0453)  Labs: Recent Labs    01/06/20 0532 01/06/20 0532 01/06/20 1038 01/06/20 1455 01/07/20 0831 01/08/20 0505  HGB 14.3   < >  --   --  13.6 13.0  HCT 42.5  --   --   --  40.2 37.6*  PLT 204  --   --   --  199 173  LABPROT  --   --  15.9*  --  16.5*  --   INR  --   --  1.3*  --  1.3*  --   HEPARINUNFRC 0.15*   < >  --  0.28* 0.60 0.54  CREATININE 1.20  --   --   --  1.59* 1.64*   < > = values in this interval not displayed.      Medical History: Past Medical History:  Diagnosis Date  . Anxiety   . Arthritis   . CAD (coronary artery disease)    a.  1993 s/p MI - [employer];  b. s/p BMS to LAD '00;  c. PTCA 2nd diagonal 2010;  d. 02/18/12 Cath: moderate nonobs dzs - med rx;  e.  01/2015 Cath: LM nl, LAD 40-53m ISR, 70-73m/d, d1 90p (3.0x16 Synergy DES), D2 50-60, LCX nl, OM1 50p, 91m (2.5x12 Synergy DES), RCA nl, EF 30-35%.  . Chronic combined systolic and diastolic CHF (congestive heart failure) ([place])    a. 12/2014 Echo: EF 30-35%, Gr2 DD, mod MR, sev dil LA.  . CKD (chronic kidney disease), stage III ([place])   . Depression   . ED (erectile dysfunction)   . GERD (gastroesophageal reflux disease)   . Hyperlipidemia   . Hypertension   . Ischemic cardiomyopathy    a. s/p St. [name] (Atlas) ICD implanted in [place] 2007;  b. 12/2014 Echo: Ef 30-35%.  . MVP (mitral valve prolapse)    a. s/p MV annuloplasty at [facility] 2004.  Marland Kitchen Persistent atrial fibrillation ([place])    a. noted on ICD interrogation '10 - not previously on [place] - CHA2DS2VASc = 5.  . Type II diabetes mellitus ([place])    uncontrolled      Assessment: 89 yom with history of AFib (history of tissue MVR), on warfarin at home, INR on admission 1.4. PTA dose: 1.5 mg on Mon/Fri, 2 mg on all other days. Patient found to have elevated troponins. Pharmacy to dose heparin.   No interventions planned and pharmacy consulted to dose apixaban.  -he was noted on tegretol PTA (last dose was a few days ago) which due to p-glycoprotein induction (resulting in decreased apixaban concentrations) is contraindicated with apixban  . Spoke with family and timing of last dose is not clear. Tegretol XL has a long have life ~ 35-40 hrs.  One study showed that enzyme induction should be reduced by about half at 3 days and by 75% at 7 days, and enzyme deinduction would be essentially complete within 2 weeks following complete discontinuation of carbamazepine  I spoke with Mr Sams- he takes Tegretol for mood stabilization (primarily for anger) and has been on this for years. He is okay with stopping this medication.  Heparin level is therapeutic at 0.54,  on 1500 units/hr. Hgb 13, plt 173. No s/sx of bleeding or infusion issues.   Goal of Therapy:  Heparin level 0.3-0.7 units/ml Monitor platelets by anticoagulation protocol: Yes   Plan:  -Continue heparin infusion at 1500 units/hr -To maximize time off of Tegretol will start apixaban 5mg  po bid on 1/26 in the morning once okay with teams  -Daily heparin level and CBC -Do not continue Tegretol at discharge  Antonietta Jewel, PharmD, [place] Pharmacist  Phone: [phone]  Please check AMION for all [place] phone numbers After 10:00 PM, call [place] [phone]

## 2020-01-08 NOTE — TOC Transition Note (Signed)
Transition of Care [facility]) - CM/SW Discharge Note   Patient Details  Name: [name] [name] MRN: [id number] Date of Birth: 09-Jun-1951  [facility] CM/SW Contact:  Zenon Mayo, RN Phone Number: 01/08/2020, 12:06 PM   Clinical Narrative:    NCM spoke with patient, he is for dc today, he states he will get the shower chair from [place].  NCM offered choice, he states he does not have a preference for [facility]. NCM made referral to [place] with [facility], she is able to take referral.  Soc will begin 24 to 48 hrs post dc.    Final next level of care: [place] Barriers to Discharge: No Barriers Identified   Patient Goals and CMS Choice Patient states their goals for this hospitalization and ongoing recovery are:: go home CMS Medicare.gov Compare Post Acute Care list provided to:: Patient Choice offered to / list presented to : Patient  Discharge Placement                       Discharge Plan and Services                DME Arranged: (NA)         HH Arranged: RN, Disease Management, PT, OT, Nurse's Aide [place] Agency: [facility] [employer]) Date [place]: 01/08/20 Time [place]: 1206 Representative spoke with at [place]: [place] ([place]) Interventions     Readmission Risk Interventions No flowsheet data found.

## 2020-01-08 NOTE — Progress Notes (Signed)
Progress Note  Patient Name: [name] [name] Date of Encounter: 01/08/2020  Primary Cardiologist: Peter Martinique, MD   Subjective   Pt has no complaints today, but did have diarrhea again.  Inpatient Medications    Scheduled Meds: . amiodarone  200 mg Oral Daily  . apixaban  5 mg Oral BID  . aspirin EC  81 mg Oral Daily  . atorvastatin  40 mg Oral q1800  . insulin aspart  0-9 Units Subcutaneous Q4H  . insulin aspart  6 Units Subcutaneous TID WC  . insulin glargine  35 Units Subcutaneous Daily  . metoprolol succinate  25 mg Oral Daily  . pantoprazole  40 mg Oral Daily  . sodium chloride flush  3 mL Intravenous Q12H  . tamsulosin  0.4 mg Oral Daily   Continuous Infusions: . sodium chloride    . heparin 1,500 Units/hr (01/07/20 1546)   PRN Meds: sodium chloride, acetaminophen **OR** acetaminophen, ALPRAZolam, sodium chloride flush   Vital Signs    Vitals:   01/07/20 0421 01/07/20 1220 01/07/20 2020 01/08/20 0453  BP: 126/89 (!) 128/96 108/82 126/86  Pulse: 92 90 84 80  Resp: 18 18 18  (!) 8  Temp: 97.8 F (36.6 C) 98 F (36.7 C) (!) 97.5 F (36.4 C) 98 F (36.7 C)  TempSrc: Oral  Oral Oral  SpO2: 97% 99% 100% 96%  Weight: 105.7 kg   105.9 kg  Height:        Intake/Output Summary (Last 24 hours) at 01/08/2020 0737 Last data filed at 01/08/2020 0600 Gross per 24 hour  Intake 1201.2 ml  Output 1000 ml  Net 201.2 ml   Last 3 Weights 01/08/2020 01/07/2020 01/06/2020  Weight (lbs) 233 lb 6.4 oz 233 lb 231 lb 1.6 oz  Weight (kg) 105.87 kg 105.688 kg 104.826 kg      Telemetry    Atrial flutter with ventricular rates in the 70-80s with occasional V-pacing - Personally Reviewed  ECG    No new tracings - Personally Reviewed  Physical Exam   GEN: elderly male, No acute distress.   Neck: No JVD Cardiac: irregular rhythm, regular rate, + murmur  Respiratory: Clear to auscultation bilaterally. GI: Soft, nontender, non-distended  MS: No edema; No  deformity. Neuro:  Nonfocal  Psych: Normal affect   Labs    High Sensitivity Troponin:   Recent Labs  Lab 01/04/20 1641 01/04/20 1841 01/04/20 2147 01/05/20 0712  TROPONINIHS 68* 100* 149* 100*      Chemistry Recent Labs  Lab 01/04/20 1642 01/04/20 1655 01/05/20 0712 01/05/20 0712 01/06/20 0532 01/07/20 0831 01/08/20 0505  NA 124*   < > 136   < > 138 136 135  K 4.5   < > 3.7   < > 4.3 4.2 3.7  CL 91*   < > 105   < > 104 102 102  CO2 20*   < > 20*   < > 25 24 25   GLUCOSE 627*   < > 214*   < > 234* 391* 265*  BUN 23   < > 20   < > 18 17 25*  CREATININE 1.45*   < > 1.25*   < > 1.20 1.59* 1.64*  CALCIUM 8.9   < > 8.8*   < > 9.2 8.8* 9.0  PROT 6.6  --  5.7*  --  6.1*  --   --   ALBUMIN 3.9  --  3.4*  --  3.5  --   --  AST 25  --  22  --  23  --   --   ALT 23  --  21  --  22  --   --   ALKPHOS 87  --  83  --  81  --   --   BILITOT 1.3*  --  0.7  --  0.8  --   --   GFRNONAA 48*   < > 58*   < > >60 43* 42*  GFRAA 56*   < > >60   < > >60 50* 48*  ANIONGAP 13   < > 11   < > 9 10 8    < > = values in this interval not displayed.     Hematology Recent Labs  Lab 01/06/20 0532 01/07/20 0831 01/08/20 0505  WBC 5.6 5.0 5.5  RBC 4.69 4.40 4.20*  HGB 14.3 13.6 13.0  HCT 42.5 40.2 37.6*  MCV 90.6 91.4 89.5  MCH 30.5 30.9 31.0  MCHC 33.6 33.8 34.6  RDW 13.2 13.2 13.2  PLT 204 199 173    BNP Recent Labs  Lab 01/04/20 1642  BNP 165.2*     DDimer No results for input(s): DDIMER in the last 168 hours.   Radiology    ECHOCARDIOGRAM COMPLETE  Result Date: 01/06/2020   ECHOCARDIOGRAM REPORT   Patient Name:   ATIKSH GANDEE [facility] Date of Exam: 01/06/2020 Medical Rec #:  [id number]           Height:       72.0 in Accession #:    [id number]          Weight:       231.1 lb Date of Birth:  [birth year]           BSA:          2.27 m Patient Age:    69 years            BP:           100/71 mmHg Patient Gender: M                   HR:           83 bpm. Exam Location:  Inpatient  Procedure: 2D Echo Indications:    Chest Pain R07.9  History:        Patient has prior history of Echocardiogram examinations, most                 recent 12/03/2016. CAD, MV Annuloplasty, Arrythmias:Atrial                 Fibrillation; Risk Factors:Diabetes, Dyslipidemia and                 Hypertension.  Sonographer:    Mikki Santee RDCS (AE) Referring Phys: [place]  1. Left ventricular ejection fraction, by visual estimation, is 25 to 30%. The left ventricle has severely decreased function. There is mildly increased left ventricular hypertrophy.  2. The left ventricle demonstrates global hypokinesis.  3. Global right ventricle has mildly reduced systolic function.The right ventricular size is normal. No increase in right ventricular wall thickness.  4. Left atrial size was severely dilated.  5. Right atrial size was mildly dilated.  6. The mitral valve has been repaired/replaced. Mild mitral valve regurgitation.  7. The tricuspid valve is grossly normal.  8. The tricuspid valve is grossly normal. Tricuspid valve regurgitation is mild.  9. The aortic valve is tricuspid.  Aortic valve regurgitation is not visualized. 10. The pulmonic valve was grossly normal. Pulmonic valve regurgitation is trivial. 11. Aortic dilatation noted. 12. There is mild dilatation of the aortic root and of the ascending aorta. 13. Normal pulmonary artery systolic pressure. 14. A pacer wire is visualized. 15. The inferior vena cava is normal in size with greater than 50% respiratory variability, suggesting right atrial pressure of 3 mmHg. FINDINGS  Left Ventricle: Left ventricular ejection fraction, by visual estimation, is 25 to 30%. The left ventricle has severely decreased function. The left ventricle demonstrates global hypokinesis. The left ventricular internal cavity size was the left ventricle is normal in size. There is mildly increased left ventricular hypertrophy. Concentric left ventricular hypertrophy.  Right Ventricle: The right ventricular size is normal. No increase in right ventricular wall thickness. Global RV systolic function is has mildly reduced systolic function. The tricuspid regurgitant velocity is 1.71 m/s, and with an assumed right atrial pressure of 3 mmHg, the estimated right ventricular systolic pressure is normal at 14.7 mmHg. Left Atrium: Left atrial size was severely dilated. Right Atrium: Right atrial size was mildly dilated Pericardium: There is no evidence of pericardial effusion. Mitral Valve: The mitral valve has been repaired/replaced. Mild mitral valve regurgitation. MV peak gradient, 7.8 mmHg. Tricuspid Valve: The tricuspid valve is grossly normal. Tricuspid valve regurgitation is mild. Aortic Valve: The aortic valve is tricuspid. Aortic valve regurgitation is not visualized. Pulmonic Valve: The pulmonic valve was grossly normal. Pulmonic valve regurgitation is trivial. Pulmonic regurgitation is trivial. Aorta: Aortic dilatation noted. There is mild dilatation of the aortic root and of the ascending aorta. Venous: The inferior vena cava is normal in size with greater than 50% respiratory variability, suggesting right atrial pressure of 3 mmHg. IAS/Shunts: No atrial level shunt detected by color flow Doppler. Additional Comments: A pacer wire is visualized.  LEFT VENTRICLE PLAX 2D LVIDd:         5.80 cm LVIDs:         5.00 cm LV PW:         1.30 cm LV IVS:        1.30 cm LVOT diam:     2.40 cm LV SV:         48 ml LV SV Index:   20.69 LVOT Area:     4.52 cm  LEFT ATRIUM              Index       RIGHT ATRIUM           Index LA diam:        5.00 cm  2.21 cm/m  RA Area:     23.20 cm LA Vol (A2C):   108.0 ml 47.67 ml/m RA Volume:   63.70 ml  28.12 ml/m LA Vol (A4C):   124.0 ml 54.73 ml/m LA Biplane Vol: 126.0 ml 55.62 ml/m  AORTIC VALVE LVOT Vmax:   71.70 cm/s LVOT Vmean:  47.400 cm/s LVOT VTI:    0.121 m  AORTA Ao Root diam: 4.00 cm MITRAL VALVE              TRICUSPID VALVE MV Area  (PHT): 2.89 cm   TR Peak grad:   11.7 mmHg MV Peak grad:  7.8 mmHg   TR Vmax:        198.00 cm/s MV Mean grad:  3.0 mmHg MV Vmax:       1.39 m/s   SHUNTS MV Vmean:      77.9  cm/s  Systemic VTI:  0.12 m MV VTI:        0.32 m     Systemic Diam: 2.40 cm MV PHT:        76.00 msec MR Peak grad: 80.3 mmHg MR Mean grad: 50.0 mmHg MR Vmax:      448.00 cm/s MR Vmean:     335.0 cm/s  Kate Sable MD Electronically signed by Kate Sable MD Signature Date/Time: 01/06/2020/12:58:17 PM    Final     Cardiac Studies   Echocardiogram 01/06/20:  1. Left ventricular ejection fraction, by visual estimation, is 25 to 30%. The left ventricle has severely decreased function. There is mildly increased left ventricular hypertrophy. 2. The left ventricle demonstrates global hypokinesis. 3. Global right ventricle has mildly reduced systolic function.The right ventricular size is normal. No increase in right ventricular wall thickness. 4. Left atrial size was severely dilated. 5. Right atrial size was mildly dilated. 6. The mitral valve has been repaired/replaced. Mild mitral valve regurgitation. 7. The tricuspid valve is grossly normal. 8. The tricuspid valve is grossly normal. Tricuspid valve regurgitation is mild. 9. The aortic valve is tricuspid. Aortic valve regurgitation is not visualized. 10. The pulmonic valve was grossly normal. Pulmonic valve regurgitation is trivial. 11. Aortic dilatation noted. 12. There is mild dilatation of the aortic root and of the ascending aorta. 13. Normal pulmonary artery systolic pressure. 14. A pacer wire is visualized. 15. The inferior vena cava is normal in size with greater than 50% respiratory variability, suggesting right atrial pressure of 3 mmHg.   Patient Profile     69 y.o. male with CAD s/p BMS to LAD 2000, PTCA 2nd diag 2010, DES LAD and DES OM1 2016, Mitral valve prolapse/ moderate MR,h/o MVR 2004, CHF (EF 25-30%) s/p AICD (St. [name]), persistent  atrial fibrillation, DM, CKD III, admitted with syncope and chest pain.  Assessment & Plan    1. Chronic systolic and diastolic heart failure 2. Ischemic cardiomyopathy 3. MR s/p MV repair - echo this admission with EF 25-30% - unchanged from TEE 2017 - has received 3 doses of PO lasix - overall net negative 1.5 L with 1L urine output yesterday - weight is 233 lbs - stable - BNP was mildly elevated to 165 - appeared near euvolemic at admission - would recommend starting with low dose PO lasix prior to discharge with close follow up   3. Persistent atrial fibrillation, ?atrial flutter 4. Hx of VT - hx of amiodarone - unclear if he was taking this - restarted amiodarone 200 mg daily with lopressor - This patients CHA2DS2-VASc Score and unadjusted Ischemic Stroke Rate (% per year) is equal to 7.2 % stroke rate/year from a score of 5 (age, DM, HTN, CHF, CAD) - transitioned coumadin to eliquis 5 mg BID   5. CAD s/p multiple PCIs - hs troponin 68 --> 100 --> 149 --> 100 - suspect demand ischemia   6. Syncope - felt to be secondary to intravascular volume depletion - diarrhea several times daily prior to admission - no events on device interrogation   7. CKD stage III - sCr 1.64 (1.59)  With K 3.7 - baseline is unclear, but may be 1.3-1.5      For questions or updates, please contact [place] Please consult [email or web address] for contact info under        Signed, Ledora Bottcher, PA  01/08/2020, 7:37 AM

## 2020-01-15 ENCOUNTER — Ambulatory Visit: Payer: Medicare HMO | Admitting: Cardiology

## 2020-01-15 ENCOUNTER — Ambulatory Visit (INDEPENDENT_AMBULATORY_CARE_PROVIDER_SITE_OTHER): Payer: Medicare HMO | Admitting: Cardiology

## 2020-01-15 ENCOUNTER — Encounter: Payer: Self-pay | Admitting: Cardiology

## 2020-01-15 ENCOUNTER — Other Ambulatory Visit: Payer: Self-pay

## 2020-01-15 ENCOUNTER — Telehealth: Payer: Self-pay | Admitting: Physician Assistant

## 2020-01-15 VITALS — BP 148/92 | HR 87 | Ht 72.0 in | Wt 245.0 lb

## 2020-01-15 DIAGNOSIS — I259 Chronic ischemic heart disease, unspecified: Secondary | ICD-10-CM

## 2020-01-15 DIAGNOSIS — Z955 Presence of coronary angioplasty implant and graft: Secondary | ICD-10-CM

## 2020-01-15 DIAGNOSIS — E782 Mixed hyperlipidemia: Secondary | ICD-10-CM

## 2020-01-15 DIAGNOSIS — I2510 Atherosclerotic heart disease of native coronary artery without angina pectoris: Secondary | ICD-10-CM | POA: Diagnosis not present

## 2020-01-15 DIAGNOSIS — I255 Ischemic cardiomyopathy: Secondary | ICD-10-CM

## 2020-01-15 DIAGNOSIS — I10 Essential (primary) hypertension: Secondary | ICD-10-CM | POA: Diagnosis not present

## 2020-01-15 DIAGNOSIS — I4891 Unspecified atrial fibrillation: Secondary | ICD-10-CM

## 2020-01-15 DIAGNOSIS — Z95810 Presence of automatic (implantable) cardiac defibrillator: Secondary | ICD-10-CM

## 2020-01-15 DIAGNOSIS — I519 Heart disease, unspecified: Secondary | ICD-10-CM

## 2020-01-15 NOTE — Patient Instructions (Signed)
Medication Instructions:  No medication changes *If you need a refill on your cardiac medications before your next appointment, please call your pharmacy*  Lab Work: None ordered  If you have labs (blood work) drawn today and your tests are completely normal, you will receive your results only by: Marland Kitchen MyChart Message (if you have MyChart) OR . A paper copy in the mail If you have any lab test that is abnormal or we need to change your treatment, we will call you to review the results.  Testing/Procedures: None ordered  Follow-Up: At [facility], you and your health needs are our priority.  As part of our continuing mission to provide you with exceptional heart care, we have created designated Provider Care Teams.  These Care Teams include your primary Cardiologist (physician) and Advanced Practice Providers (APPs -  Physician Assistants and Nurse Practitioners) who all work together to provide you with the care you need, when you need it.  Your next appointment:   2 week(s)  The format for your next appointment:   In Person  Provider:   Jyl Heinz, MD  Other Instructions NA

## 2020-01-15 NOTE — Progress Notes (Signed)
Cardiology Office Note:    Date:  01/15/2020   ID:  [name] [name], DOB November 19, 1951, MRN [id number]  PCP:  [name] Poli, PA  Cardiologist:  [name] Lindau, MD   Referring MD: [name] Riches, NP    ASSESSMENT:    1. Essential hypertension   2. Ischemic cardiomyopathy   3. IHD (ischemic heart disease)   4. Coronary artery disease involving native coronary artery of native heart without angina pectoris   5. Atrial fibrillation, unspecified type ([place])   6. S/P coronary artery stent placement   7. Automatic implantable cardioverter-defibrillator in situ   8. Chronic systolic dysfunction of left ventricle   9. Mixed hyperlipidemia    PLAN:    In order of problems listed above:  1. Ischemic heart disease and cardiomyopathy: I discussed my findings with the patient at extensive length.  Secondary prevention stressed.  Importance of compliance with diet and medication stressed and he vocalized understanding.  I told him about diet especially salt intake and regularly weighing himself.  He is planning to buy a machine to weigh himself and also his blood pressure and is going to keep a close track of it. 2. Essential hypertension: Blood pressure is mildly elevated.  He tells me that he will check it at home before increasing any medications.  I respect his wishes. 3. Mixed dyslipidemia diabetes mellitus: Managed by his primary care physician.  He is on medications for this appropriately. 4. Atrial fibrillation:I discussed with the patient atrial fibrillation, disease process. Management and therapy including rate and rhythm control, anticoagulation benefits and potential risks were discussed extensively with the patient. Patient had multiple questions which were answered to patient's satisfaction. 5. He will be seen in follow-up appointment in 3 weeks or earlier if he has any concerns.  At that time I would like to make sure he is keeping up with track of his weight and blood pressures  and fine-tune his medications as needed. 6. Renal insufficiency: Stable and managed by his primary care physician.  Multiple medical comorbidities.  Total time for this evaluation and management plan in discussing during this visit was 40 minutes.   Medication Adjustments/Labs and Tests Ordered: Current medicines are reviewed at length with the patient today.  Concerns regarding medicines are outlined above.  No orders of the defined types were placed in this encounter.  No orders of the defined types were placed in this encounter.    No chief complaint on file.    History of Present Illness:    [name] [name] is a 69 y.o. male.  Patient has past medical history of coronary artery disease post CABG and stenting.  He recently was admitted to the hospital.  He has advanced ischemic cardiomyopathy, essential hypertension dyslipidemia and diabetes mellitus.  He was treated and released and subsequently is being better.  He leads a fairly sedentary lifestyle.  No chest pain orthopnea or PND.  He is here for follow-up appointment.  He is previously unknown to me.  At the time of my evaluation, the patient is alert awake oriented and in no distress.  Past Medical History:  Diagnosis Date  . Anxiety   . Arthritis   . CAD (coronary artery disease)    a.  1993 s/p MI - [employer];  b. s/p BMS to LAD '00;  c. PTCA 2nd diagonal 2010;  d. 02/18/12 Cath: moderate nonobs dzs - med rx;  e.  01/2015 Cath: LM nl, LAD 40-70m ISR, 70-52m/d, d1  90p (3.0x16 Synergy DES), D2 50-60, LCX nl, OM1 50p, 50m (2.5x12 Synergy DES), RCA nl, EF 30-35%.  . Chronic combined systolic and diastolic CHF (congestive heart failure) ([place])    a. 12/2014 Echo: EF 30-35%, Gr2 DD, mod MR, sev dil LA.  . CKD (chronic kidney disease), stage III   . Depression   . ED (erectile dysfunction)   . GERD (gastroesophageal reflux disease)   . Hyperlipidemia   . Hypertension   . Ischemic cardiomyopathy    a. s/p St. [name] (Atlas)  ICD implanted in [place] 2007;  b. 12/2014 Echo: Ef 30-35%.  . MVP (mitral valve prolapse)    a. s/p MV annuloplasty at [facility] 2004.  Marland Kitchen Persistent atrial fibrillation ([place])    a. noted on ICD interrogation '10 - not previously on [place] - CHA2DS2VASc = 5.  . Type II diabetes mellitus ([place])    uncontrolled    Past Surgical History:  Procedure Laterality Date  . CARDIAC DEFIBRILLATOR PLACEMENT  2007   implanted in [place], has a 7001 RV lead and a SJM Atlas ICD followed by Dr Caryl Comes  . CARDIOVERSION N/A 09/29/2016   Procedure: CARDIOVERSION;  Surgeon: Lelon Perla, MD;  Location: [facility] ENDOSCOPY;  Service: Cardiovascular;  Laterality: N/A;  . CARDIOVERSION N/A 12/03/2016   Procedure: CARDIOVERSION;  Surgeon: Dorothy Spark, MD;  Location: [place];  Service: Cardiovascular;  Laterality: N/A;  . IMPLANTABLE CARDIOVERTER DEFIBRILLATOR (ICD) GENERATOR CHANGE N/A 03/19/2013   SJM Fortify ST DR generator placed by Dr Lovena Le, part of Analyze ST study  . LEFT AND RIGHT HEART CATHETERIZATION WITH CORONARY ANGIOGRAM N/A 01/14/2015   Procedure: LEFT AND RIGHT HEART CATHETERIZATION WITH CORONARY ANGIOGRAM;  Surgeon: Peter M Martinique, MD;  Location: [facility] CATH LAB;  Service: Cardiovascular;  Laterality: N/A;  . LEFT HEART CATHETERIZATION WITH CORONARY ANGIOGRAM N/A 02/17/2012   Procedure: LEFT HEART CATHETERIZATION WITH CORONARY ANGIOGRAM;  Surgeon: Jolaine Artist, MD;  Location: [facility] CATH LAB;  Service: Cardiovascular;  Laterality: N/A;  . MITRAL VALVE ANNULOPLASTY  2004   Archie Endo 02/17/2012  . TEE WITHOUT CARDIOVERSION N/A 09/29/2016   Procedure: TRANSESOPHAGEAL ECHOCARDIOGRAM (TEE);  Surgeon: Lelon Perla, MD;  Location: [facility] ENDOSCOPY;  Service: Cardiovascular;  Laterality: N/A;  . TEE WITHOUT CARDIOVERSION N/A 12/03/2016   Procedure: TRANSESOPHAGEAL ECHOCARDIOGRAM (TEE);  Surgeon: Dorothy Spark, MD;  Location: [facility] ENDOSCOPY;  Service: Cardiovascular;  Laterality: N/A;    Current  Medications: Current Meds  Medication Sig  . ALPRAZolam (XANAX) 1 MG tablet Take 1 tablet (1 mg total) by mouth 2 (two) times daily as needed for anxiety.  Marland Kitchen amiodarone (PACERONE) 200 MG tablet Take 1 tablet (200 mg total) by mouth daily. <PLEASE MAKE APPOINTMENT FOR REFILLS>  . apixaban (ELIQUIS) 5 MG TABS tablet Take 1 tablet (5 mg total) by mouth 2 (two) times daily.  Marland Kitchen atorvastatin (LIPITOR) 40 MG tablet Take 1 tablet (40 mg total) by mouth daily at 6 PM.  . insulin aspart (NOVOLOG FLEXPEN) 100 UNIT/ML FlexPen Inject 6 Units into the skin 3 (three) times daily with meals.  . Insulin Glargine (BASAGLAR KWIKPEN) 100 UNIT/ML SOPN Inject 0.5 mLs (50 Units total) into the skin daily.  . metoprolol succinate (TOPROL-XL) 25 MG 24 hr tablet Take 1 tablet (25 mg total) by mouth daily.  . nitroGLYCERIN (NITROSTAT) 0.4 MG SL tablet Place 1 tablet (0.4 mg total) under the tongue every 5 (five) minutes x 3 doses as needed for chest pain.  . pantoprazole (PROTONIX) 40 MG  tablet Take 1 tablet (40 mg total) by mouth daily.  . tamsulosin (FLOMAX) 0.4 MG CAPS capsule Take 1 capsule (0.4 mg total) by mouth daily.     Allergies:   Liraglutide and Lisinopril   Social History   Socioeconomic History  . Marital status: Married    Spouse name: Not on file  . Number of children: 8  . Years of education: Not on file  . Highest education level: Not on file  Occupational History  . Occupation: Community education officer    Comment: disabled  Tobacco Use  . Smoking status: Never Smoker  . Smokeless tobacco: Never Used  Substance and Sexual Activity  . Alcohol use: Yes    Alcohol/week: 0.0 standard drinks    Comment: 01/14/2015 "used to drink alot; stopped completely in 1983"  . Drug use: No    Comment: 01/14/2015 "used to use about anything I could get; stopped completely in 1983"  . Sexual activity: Never  Other Topics Concern  . Not on file  Social History Narrative  . Not on file   Social Determinants of Health    Financial Resource Strain:   . Difficulty of Paying Living Expenses: Not on file  Food Insecurity:   . Worried About Charity fundraiser in the Last Year: Not on file  . Ran Out of Food in the Last Year: Not on file  Transportation Needs:   . Lack of Transportation (Medical): Not on file  . Lack of Transportation (Non-Medical): Not on file  Physical Activity:   . Days of Exercise per Week: Not on file  . Minutes of Exercise per Session: Not on file  Stress:   . Feeling of Stress : Not on file  Social Connections:   . Frequency of Communication with Friends and Family: Not on file  . Frequency of Social Gatherings with Friends and Family: Not on file  . Attends Religious Services: Not on file  . Active Member of Clubs or Organizations: Not on file  . Attends Archivist Meetings: Not on file  . Marital Status: Not on file     Family History: The patient's family history includes Colon cancer in his mother and sister; Coronary artery disease in his brother and mother; Heart attack in his mother; Heart disease in his brother and mother; Heart failure in his mother; Hypertension in his mother; Malignant hyperthermia in his mother.  ROS:   Please see the history of present illness.    All other systems reviewed and are negative.  EKGs/Labs/Other Studies Reviewed:    The following studies were reviewed today: I reviewed the reports of echocardiogram with the patient at length. IMPRESSIONS    1. Left ventricular ejection fraction, by visual estimation, is 25 to  30%. The left ventricle has severely decreased function. There is mildly  increased left ventricular hypertrophy.  2. The left ventricle demonstrates global hypokinesis.  3. Global right ventricle has mildly reduced systolic function.The right  ventricular size is normal. No increase in right ventricular wall  thickness.  4. Left atrial size was severely dilated.  5. Right atrial size was mildly dilated.   6. The mitral valve has been repaired/replaced. Mild mitral valve  regurgitation.  7. The tricuspid valve is grossly normal.  8. The tricuspid valve is grossly normal. Tricuspid valve regurgitation  is mild.  9. The aortic valve is tricuspid. Aortic valve regurgitation is not  visualized.  10. The pulmonic valve was grossly normal. Pulmonic valve regurgitation is  trivial.  11. Aortic dilatation noted.  12. There is mild dilatation of the aortic root and of the ascending  aorta.  13. Normal pulmonary artery systolic pressure.  14. A pacer wire is visualized.  15. The inferior vena cava is normal in size with greater than 50%  respiratory variability, suggesting right atrial pressure of 3 mmHg.   Recent Labs: 01/04/2020: B Natriuretic Peptide 165.2 01/06/2020: ALT 22 01/07/2020: Magnesium 1.8 01/08/2020: BUN 25; Creatinine, Ser 1.64; Hemoglobin 13.0; Platelets 173; Potassium 3.7; Sodium 135  Recent Lipid Panel    Component Value Date/Time   CHOL 124 01/06/2020 0532   TRIG 147 01/06/2020 0532   HDL 32 (L) 01/06/2020 0532   CHOLHDL 3.9 01/06/2020 0532   VLDL 29 01/06/2020 0532   LDLCALC 63 01/06/2020 0532    Physical Exam:    VS:  BP (!) 148/92   Pulse 87   Ht 6' (1.829 m)   Wt 245 lb (111.1 kg)   SpO2 97%   BMI 33.23 kg/m     Wt Readings from Last 3 Encounters:  01/15/20 245 lb (111.1 kg)  01/08/20 233 lb 6.4 oz (105.9 kg)  04/24/18 237 lb (107.5 kg)     GEN: Patient is in no acute distress HEENT: Normal NECK: No JVD; No carotid bruits LYMPHATICS: No lymphadenopathy CARDIAC: Hear sounds regular, 2/6 systolic murmur at the apex. RESPIRATORY:  Clear to auscultation without rales, wheezing or rhonchi  ABDOMEN: Soft, non-tender, non-distended MUSCULOSKELETAL:  No edema; No deformity  SKIN: Warm and dry NEUROLOGIC:  Alert and oriented x 3 PSYCHIATRIC:  Normal affect   Signed, [name] Lindau, MD  01/15/2020 3:42 PM    [facility]

## 2020-01-15 NOTE — Telephone Encounter (Signed)
New message:    Dorothea Ogle calling from  [facility] [facility] would like to get a verbal  order for once a week for 8 weeks. Please call Dorothea Ogle at [phone] can be left on voice mail.

## 2020-01-15 NOTE — Telephone Encounter (Signed)
ok 

## 2020-01-16 ENCOUNTER — Telehealth: Payer: Self-pay | Admitting: Cardiology

## 2020-01-16 NOTE — Telephone Encounter (Signed)
New message   The patient's social work visit will be delayed until next week.

## 2020-01-16 NOTE — Telephone Encounter (Signed)
Called and left a voicemail message for Dorothea Ogle with [facility] PT to have pt do PT once a week for 8 Weeks as per Dr. Julien Nordmann recommendation.

## 2020-01-16 NOTE — Telephone Encounter (Signed)
Left message for social worker that I would forward her message to Dr Geraldo Pitter.  Left message to call back if any questions.

## 2020-01-30 DIAGNOSIS — E11319 Type 2 diabetes mellitus with unspecified diabetic retinopathy without macular edema: Secondary | ICD-10-CM

## 2020-01-30 HISTORY — DX: Type 2 diabetes mellitus with unspecified diabetic retinopathy without macular edema: E11.319

## 2020-02-07 ENCOUNTER — Ambulatory Visit (INDEPENDENT_AMBULATORY_CARE_PROVIDER_SITE_OTHER): Payer: Medicare HMO | Admitting: Cardiology

## 2020-02-07 ENCOUNTER — Encounter: Payer: Self-pay | Admitting: Cardiology

## 2020-02-07 ENCOUNTER — Other Ambulatory Visit: Payer: Self-pay

## 2020-02-07 VITALS — BP 130/88 | HR 72 | Ht 72.0 in | Wt 245.0 lb

## 2020-02-07 DIAGNOSIS — E782 Mixed hyperlipidemia: Secondary | ICD-10-CM | POA: Diagnosis not present

## 2020-02-07 DIAGNOSIS — I2510 Atherosclerotic heart disease of native coronary artery without angina pectoris: Secondary | ICD-10-CM | POA: Diagnosis not present

## 2020-02-07 DIAGNOSIS — I10 Essential (primary) hypertension: Secondary | ICD-10-CM | POA: Diagnosis not present

## 2020-02-07 DIAGNOSIS — Z95810 Presence of automatic (implantable) cardiac defibrillator: Secondary | ICD-10-CM

## 2020-02-07 DIAGNOSIS — I255 Ischemic cardiomyopathy: Secondary | ICD-10-CM

## 2020-02-07 DIAGNOSIS — N189 Chronic kidney disease, unspecified: Secondary | ICD-10-CM

## 2020-02-07 DIAGNOSIS — F332 Major depressive disorder, recurrent severe without psychotic features: Secondary | ICD-10-CM

## 2020-02-07 HISTORY — DX: Major depressive disorder, recurrent severe without psychotic features: F33.2

## 2020-02-07 NOTE — Patient Instructions (Signed)
Medication Instructions:  No medication changes. *If you need a refill on your cardiac medications before your next appointment, please call your pharmacy*  Lab Work: You had a BMET today. If you have labs (blood work) drawn today and your tests are completely normal, you will receive your results only by: Marland Kitchen MyChart Message (if you have MyChart) OR . A paper copy in the mail If you have any lab test that is abnormal or we need to change your treatment, we will call you to review the results.  Testing/Procedures: None ordered  Follow-Up: At [facility], you and your health needs are our priority.  As part of our continuing mission to provide you with exceptional heart care, we have created designated Provider Care Teams.  These Care Teams include your primary Cardiologist (physician) and Advanced Practice Providers (APPs -  Physician Assistants and Nurse Practitioners) who all work together to provide you with the care you need, when you need it.  Your next appointment:   1 month(s)  The format for your next appointment:   In Person  Provider:   Jyl Heinz, MD  Other Instructions NA

## 2020-02-07 NOTE — Progress Notes (Signed)
Cardiology Office Note:    Date:  02/07/2020   ID:  [name] [name], DOB [birth year], MRN [id number]  PCP:  [name], PA  Cardiologist:  Jenean Lindau, MD   Referring MD: [name], PA    ASSESSMENT:    1. Essential hypertension   2. Mixed hyperlipidemia   3. Ischemic cardiomyopathy   4. Coronary artery disease involving native coronary artery of native heart without angina pectoris   5. Automatic implantable cardioverter-defibrillator in situ   6. Chronic kidney disease, unspecified CKD stage    PLAN:    In order of problems listed above:  1. Ischemic cardiomyopathy: Secondary prevention stressed with the patient.  Importance of compliance with diet and medication stressed and he vocalized understanding.  Congestive heart failure issues were discussed with the patient at extensive length and he promises to comply.  He is checking his weight on a regular basis.  He will have a Chem-7 today. 2. Essential hypertension: Blood pressure is stable. 3. Mixed dyslipidemia: Managed by primary care.  Diet was emphasized. 4. Patient has a defibrillator in situ and followed by her colleagues in the electrophysiology section. 5. Chronic kidney insufficiency: This makes his management challenging and he is aware of it. 6. Patient will be seen in follow-up appointment in 1 months or earlier if the patient has any concerns    Medication Adjustments/Labs and Tests Ordered: Current medicines are reviewed at length with the patient today.  Concerns regarding medicines are outlined above.  No orders of the defined types were placed in this encounter.  No orders of the defined types were placed in this encounter.    Chief Complaint  Patient presents with  . Follow-up    2 Weeks     History of Present Illness:    [name] [name] is a 69 y.o. male.  Patient has history of advanced cardiomyopathy, essential hypertension, dyslipidemia.  He was admitted to the hospital  with congestive heart failure treated and released.  Since then he is done well.  He tries his best to keep a meticulous watch on his weight and also he is cognizant about dietary restrictions.  He tells me that he is breathing much better than before going to the hospital.  At the time of my evaluation, the patient is alert awake oriented and in no distress.  Past Medical History:  Diagnosis Date  . Anxiety   . Arthritis   . CAD (coronary artery disease)    a.  1993 s/p MI - [employer];  b. s/p BMS to LAD '00;  c. PTCA 2nd diagonal 2010;  d. 02/18/12 Cath: moderate nonobs dzs - med rx;  e.  01/2015 Cath: LM nl, LAD 40-62m ISR, 70-3m/d, d1 90p (3.0x16 Synergy DES), D2 50-60, LCX nl, OM1 50p, 75m (2.5x12 Synergy DES), RCA nl, EF 30-35%.  . Chronic combined systolic and diastolic CHF (congestive heart failure) ([place])    a. 12/2014 Echo: EF 30-35%, Gr2 DD, mod MR, sev dil LA.  . CKD (chronic kidney disease), stage III   . Depression   . ED (erectile dysfunction)   . GERD (gastroesophageal reflux disease)   . Hyperlipidemia   . Hypertension   . Ischemic cardiomyopathy    a. s/p St. [name] (Atlas) ICD implanted in [place] 2007;  b. 12/2014 Echo: Ef 30-35%.  . MVP (mitral valve prolapse)    a. s/p MV annuloplasty at [facility] 2004.  Marland Kitchen Persistent atrial fibrillation ([place])    a. noted  on ICD interrogation '10 - not previously on [place] - CHA2DS2VASc = 5.  . Type II diabetes mellitus ([place])    uncontrolled    Past Surgical History:  Procedure Laterality Date  . CARDIAC DEFIBRILLATOR PLACEMENT  2007   implanted in [place], has a 7001 RV lead and a SJM Atlas ICD followed by Dr Caryl Comes  . CARDIOVERSION N/A 09/29/2016   Procedure: CARDIOVERSION;  Surgeon: Lelon Perla, MD;  Location: [facility] ENDOSCOPY;  Service: Cardiovascular;  Laterality: N/A;  . CARDIOVERSION N/A 12/03/2016   Procedure: CARDIOVERSION;  Surgeon: Dorothy Spark, MD;  Location: [place];  Service: Cardiovascular;   Laterality: N/A;  . IMPLANTABLE CARDIOVERTER DEFIBRILLATOR (ICD) GENERATOR CHANGE N/A 03/19/2013   SJM Fortify ST DR generator placed by Dr Lovena Le, part of Analyze ST study  . LEFT AND RIGHT HEART CATHETERIZATION WITH CORONARY ANGIOGRAM N/A 01/14/2015   Procedure: LEFT AND RIGHT HEART CATHETERIZATION WITH CORONARY ANGIOGRAM;  Surgeon: Peter M Martinique, MD;  Location: [facility] CATH LAB;  Service: Cardiovascular;  Laterality: N/A;  . LEFT HEART CATHETERIZATION WITH CORONARY ANGIOGRAM N/A 02/17/2012   Procedure: LEFT HEART CATHETERIZATION WITH CORONARY ANGIOGRAM;  Surgeon: Jolaine Artist, MD;  Location: [facility] CATH LAB;  Service: Cardiovascular;  Laterality: N/A;  . MITRAL VALVE ANNULOPLASTY  2004   Archie Endo 02/17/2012  . TEE WITHOUT CARDIOVERSION N/A 09/29/2016   Procedure: TRANSESOPHAGEAL ECHOCARDIOGRAM (TEE);  Surgeon: Lelon Perla, MD;  Location: [facility] ENDOSCOPY;  Service: Cardiovascular;  Laterality: N/A;  . TEE WITHOUT CARDIOVERSION N/A 12/03/2016   Procedure: TRANSESOPHAGEAL ECHOCARDIOGRAM (TEE);  Surgeon: Dorothy Spark, MD;  Location: [facility] ENDOSCOPY;  Service: Cardiovascular;  Laterality: N/A;    Current Medications: Current Meds  Medication Sig  . ALPRAZolam (XANAX) 1 MG tablet Take 1 tablet (1 mg total) by mouth 2 (two) times daily as needed for anxiety.  Marland Kitchen amiodarone (PACERONE) 200 MG tablet Take 1 tablet (200 mg total) by mouth daily. <PLEASE MAKE APPOINTMENT FOR REFILLS>  . apixaban (ELIQUIS) 5 MG TABS tablet Take 1 tablet (5 mg total) by mouth 2 (two) times daily.  Marland Kitchen atorvastatin (LIPITOR) 40 MG tablet Take 1 tablet (40 mg total) by mouth daily at 6 PM.  . furosemide (LASIX) 40 MG tablet Take by mouth.  . insulin aspart (NOVOLOG FLEXPEN) 100 UNIT/ML FlexPen Inject 6 Units into the skin 3 (three) times daily with meals.  . Insulin Glargine (BASAGLAR KWIKPEN) 100 UNIT/ML SOPN Inject 0.5 mLs (50 Units total) into the skin daily.  . metoprolol succinate (TOPROL-XL) 25 MG 24 hr tablet Take 1 tablet  (25 mg total) by mouth daily.  . nitroGLYCERIN (NITROSTAT) 0.4 MG SL tablet Place 1 tablet (0.4 mg total) under the tongue every 5 (five) minutes x 3 doses as needed for chest pain.  . pantoprazole (PROTONIX) 40 MG tablet Take 1 tablet (40 mg total) by mouth daily.  . potassium chloride SA (KLOR-CON) 20 MEQ tablet Take by mouth.  . tamsulosin (FLOMAX) 0.4 MG CAPS capsule Take 1 capsule (0.4 mg total) by mouth daily.     Allergies:   Liraglutide and Lisinopril   Social History   Socioeconomic History  . Marital status: Married    Spouse name: Not on file  . Number of children: 8  . Years of education: Not on file  . Highest education level: Not on file  Occupational History  . Occupation: Community education officer    Comment: disabled  Tobacco Use  . Smoking status: Never Smoker  . Smokeless tobacco: Never Used  Substance and Sexual Activity  . Alcohol use: Yes    Alcohol/week: 0.0 standard drinks    Comment: 01/14/2015 "used to drink alot; stopped completely in 1983"  . Drug use: No    Comment: 01/14/2015 "used to use about anything I could get; stopped completely in 1983"  . Sexual activity: Never  Other Topics Concern  . Not on file  Social History Narrative  . Not on file   Social Determinants of Health   Financial Resource Strain:   . Difficulty of Paying Living Expenses: Not on file  Food Insecurity:   . Worried About Charity fundraiser in the Last Year: Not on file  . Ran Out of Food in the Last Year: Not on file  Transportation Needs:   . Lack of Transportation (Medical): Not on file  . Lack of Transportation (Non-Medical): Not on file  Physical Activity:   . Days of Exercise per Week: Not on file  . Minutes of Exercise per Session: Not on file  Stress:   . Feeling of Stress : Not on file  Social Connections:   . Frequency of Communication with Friends and Family: Not on file  . Frequency of Social Gatherings with Friends and Family: Not on file  . Attends Religious Services:  Not on file  . Active Member of Clubs or Organizations: Not on file  . Attends Archivist Meetings: Not on file  . Marital Status: Not on file     Family History: The patient's family history includes Colon cancer in his mother and sister; Coronary artery disease in his brother and mother; Heart attack in his mother; Heart disease in his brother and mother; Heart failure in his mother; Hypertension in his mother; Malignant hyperthermia in his mother.  ROS:   Please see the history of present illness.    All other systems reviewed and are negative.  EKGs/Labs/Other Studies Reviewed:    The following studies were reviewed today: IMPRESSIONS    1. Left ventricular ejection fraction, by visual estimation, is 25 to  30%. The left ventricle has severely decreased function. There is mildly  increased left ventricular hypertrophy.  2. The left ventricle demonstrates global hypokinesis.  3. Global right ventricle has mildly reduced systolic function.The right  ventricular size is normal. No increase in right ventricular wall  thickness.  4. Left atrial size was severely dilated.  5. Right atrial size was mildly dilated.  6. The mitral valve has been repaired/replaced. Mild mitral valve  regurgitation.  7. The tricuspid valve is grossly normal.  8. The tricuspid valve is grossly normal. Tricuspid valve regurgitation  is mild.  9. The aortic valve is tricuspid. Aortic valve regurgitation is not  visualized.  10. The pulmonic valve was grossly normal. Pulmonic valve regurgitation is  trivial.  11. Aortic dilatation noted.  12. There is mild dilatation of the aortic root and of the ascending  aorta.  13. Normal pulmonary artery systolic pressure.  14. A pacer wire is visualized.  15. The inferior vena cava is normal in size with greater than 50%  respiratory variability, suggesting right atrial pressure of 3 mmHg.    Recent Labs: 01/04/2020: B Natriuretic Peptide  165.2 01/06/2020: ALT 22 01/07/2020: Magnesium 1.8 01/08/2020: BUN 25; Creatinine, Ser 1.64; Hemoglobin 13.0; Platelets 173; Potassium 3.7; Sodium 135  Recent Lipid Panel    Component Value Date/Time   CHOL 124 01/06/2020 0532   TRIG 147 01/06/2020 0532   HDL 32 (L) 01/06/2020 0532  CHOLHDL 3.9 01/06/2020 0532   VLDL 29 01/06/2020 0532   LDLCALC 63 01/06/2020 0532    Physical Exam:    VS:  BP 130/88   Pulse 72   Ht 6' (1.829 m)   Wt 245 lb (111.1 kg)   SpO2 95%   BMI 33.23 kg/m     Wt Readings from Last 3 Encounters:  02/07/20 245 lb (111.1 kg)  01/15/20 245 lb (111.1 kg)  01/08/20 233 lb 6.4 oz (105.9 kg)     GEN: Patient is in no acute distress HEENT: Normal NECK: No JVD; No carotid bruits LYMPHATICS: No lymphadenopathy CARDIAC: Hear sounds regular, 2/6 systolic murmur at the apex. RESPIRATORY:  Clear to auscultation without rales, wheezing or rhonchi  ABDOMEN: Soft, non-tender, non-distended MUSCULOSKELETAL:  No edema; No deformity  SKIN: Warm and dry NEUROLOGIC:  Alert and oriented x 3 PSYCHIATRIC:  Normal affect   Signed, Jenean Lindau, MD  02/07/2020 2:48 PM    [place]

## 2020-02-08 ENCOUNTER — Ambulatory Visit (INDEPENDENT_AMBULATORY_CARE_PROVIDER_SITE_OTHER): Payer: Medicare HMO | Admitting: Physician Assistant

## 2020-02-08 VITALS — BP 154/90 | HR 89 | Ht 71.0 in | Wt 254.0 lb

## 2020-02-08 DIAGNOSIS — I4811 Longstanding persistent atrial fibrillation: Secondary | ICD-10-CM

## 2020-02-08 DIAGNOSIS — I2510 Atherosclerotic heart disease of native coronary artery without angina pectoris: Secondary | ICD-10-CM

## 2020-02-08 DIAGNOSIS — I10 Essential (primary) hypertension: Secondary | ICD-10-CM

## 2020-02-08 DIAGNOSIS — Z95810 Presence of automatic (implantable) cardiac defibrillator: Secondary | ICD-10-CM | POA: Diagnosis not present

## 2020-02-08 DIAGNOSIS — I5022 Chronic systolic (congestive) heart failure: Secondary | ICD-10-CM | POA: Diagnosis not present

## 2020-02-08 DIAGNOSIS — I5043 Acute on chronic combined systolic (congestive) and diastolic (congestive) heart failure: Secondary | ICD-10-CM

## 2020-02-08 LAB — BASIC METABOLIC PANEL
BUN/Creatinine Ratio: 14 (ref 10–24)
BUN: 20 mg/dL (ref 8–27)
CO2: 21 mmol/L (ref 20–29)
Calcium: 9.3 mg/dL (ref 8.6–10.2)
Chloride: 102 mmol/L (ref 96–106)
Creatinine, Ser: 1.48 mg/dL — ABNORMAL HIGH (ref 0.76–1.27)
GFR calc Af Amer: 55 mL/min/{1.73_m2} — ABNORMAL LOW (ref >59)
GFR calc non Af Amer: 47 mL/min/{1.73_m2} — ABNORMAL LOW (ref >59)
Glucose: 223 mg/dL — ABNORMAL HIGH (ref 65–99)
Potassium: 3.8 mmol/L (ref 3.5–5.2)
Sodium: 141 mmol/L (ref 134–144)

## 2020-02-08 MED ORDER — METOPROLOL SUCCINATE ER 50 MG PO TB24: 50.0000 mg | ORAL_TABLET | Freq: Every day | ORAL | 1 refills | Status: DC

## 2020-02-08 NOTE — Patient Instructions (Signed)
Medication Instructions:    STOP TAKING AMIODARONE  200  MG ONCE A DAY   START TAKING  METOPROLOL 50 MG ONCE A DAY   *If you need a refill on your cardiac medications before your next appointment, please call your pharmacy*   Lab Work:  TSH TODAY    If you have labs (blood work) drawn today and your tests are completely normal, you will receive your results only by: Marland Kitchen MyChart Message (if you have MyChart) OR . A paper copy in the mail If you have any lab test that is abnormal or we need to change your treatment, we will call you to review the results.   Testing/Procedures: NONE ORDERED  TODAY  Follow-Up: At [facility], you and your health needs are our priority.  As part of our continuing mission to provide you with exceptional heart care, we have created designated Provider Care Teams.  These Care Teams include your primary Cardiologist (physician) and Advanced Practice Providers (APPs -  Physician Assistants and Nurse Practitioners) who all work together to provide you with the care you need, when you need it.  We recommend signing up for the patient portal called "MyChart".  Sign up information is provided on this After Visit Summary.  MyChart is used to connect with patients for Virtual Visits (Telemedicine).  Patients are able to view lab/test results, encounter notes, upcoming appointments, etc.  Non-urgent messages can be sent to your provider as well.   To learn more about what you can do with MyChart, go to [email or web address].    Your next appointment:    1 month(s) [place]   The format for your next appointment:   In Person  Provider:   You may see Thompson Grayer, MD    Other Instructions

## 2020-02-08 NOTE — Progress Notes (Signed)
Cardiology Office Note Date:  02/08/2020  Patient ID:  [name] [name], [name] [name] 1951-10-03, MRN [id number] PCP:  [name], [place]  Cardiologist:  Dr. Martinique >> Dr. Geraldo Pitter Electrophysiologist: Dr. Rayann Heman   Chief Complaint: over due device visit  History of Present Illness: OTHER SCHNITZER is a 70 y.o. male with history of CAD (Feb 2016 last intervention DES to LAD and LCx), CKD (stage III), ICM w/ICD, combined CHF, MVP w/MV annuloplasty (2004), HTN, HLD, DM, persistent AFib   Regarding his history of ischemic cardiomyopathy, he underwent stenting of the proximal LAD and diagonal branch in 2000. Cardiac caths in 2005 Dorthula Rue MD) and 2013 ([place]) showed no obstructive disease. He is s/p MV repair at [facility] in 2004. He has chronic moderate MR. In February 2016 he underwent right and left heart cath. He had stenting of the first diagonal and first OM with DES. There was diffuse LAD disease treated medically. EF 30-35%. Mild MR and normal right heart pressures  He comes in today to be seen for Dr. Rayann Heman, last seen by him May 2019, at that time noted some falls of late, otherwise doing well, Battery integrity alert installed today, vibratory alert demonstrated, and battery advisory discussed with the patient. AF burden was <1% and continued on amiodarone, planned for surveillance labs  He was hospitalized 01/04/20 with diarrhea, weakness, and trouble walking. He has had severe watery diarrhea, syncope, and CP.  Cardiology followed, unclear how compliant he had been with his home meds, mild abn HS trop felt to be demand.  Syncope felt 2/2 dehydration in setting of diarrhea, reportedly no abnormal findings by device interrogation He was changed from warfarin to Eliquis, discharged 01/08/20.  He has seen Dr. Geraldo Pitter x2 since discharge, most recently yesterday, was doing well, no changes were made.  He is doing well he says.  In general more tired in the last year then he  has been historically, no CP, palpitations.  He mentions some baseline DOE, no rest symptoms.  Feels like the masks make his get a dry cough and harder to breath.  His weight is up from yesterday's charted weight, unclear why. Denies symptoms of PND or orthopnea. No dizzy spells, near syncope or syncope.  He thinks he was shocked by his device a few months ago Says he was getting out of the shower when out of nowhere he got sudden shock through his chest and was gone.  He did not feel CP outside of the shock, did not feel poorly, no syncope, Given he felt fine, he thought maybe he was wrong.  He says the cost of his insulin went up and he can not afford it, says he has discussed withhis PMD and they gave him resources to check into, says his daughter is helping with this.   Device History: STJ dual chamber ICD implanted 2007 for ICM; gen change 2014 History of appropriate therapy: No History of AAD therapy: Yes - amiodarone for AF  We re-dicsussed advisory and importance of home monitoring and regular follow up. He tells [place] he moved and not sure where his transmitter is.  Device clinic is aware, confirmed his current address and will order him a monitor, he was asked to call us once he receives it  AF hx: paroxysmal Afib 2010  Atypical Aflutter Oct 2017 >> started on warfarin >> eliquis Jan 2021 TEE/DCCV 09/29/16 TEE/DCCV 12/03/16 >> started on amiodarone >> current   Past Medical History:  Diagnosis Date  .  Anxiety   . Arthritis   . CAD (coronary artery disease)    a.  1993 s/p MI - [employer];  b. s/p BMS to LAD '00;  c. PTCA 2nd diagonal 2010;  d. 02/18/12 Cath: moderate nonobs dzs - med rx;  e.  01/2015 Cath: LM nl, LAD 40-21m ISR, 70-29m/d, d1 90p (3.0x16 Synergy DES), D2 50-60, LCX nl, OM1 50p, 34m (2.5x12 Synergy DES), RCA nl, EF 30-35%.  . Chronic combined systolic and diastolic CHF (congestive heart failure) ([place])    a. 12/2014 Echo: EF 30-35%, Gr2 DD, mod MR, sev dil LA.    . CKD (chronic kidney disease), stage III   . Depression   . ED (erectile dysfunction)   . GERD (gastroesophageal reflux disease)   . Hyperlipidemia   . Hypertension   . Ischemic cardiomyopathy    a. s/p St. [name] (Atlas) ICD implanted in [place] 2007;  b. 12/2014 Echo: Ef 30-35%.  . MVP (mitral valve prolapse)    a. s/p MV annuloplasty at [facility] 2004.  Marland Kitchen Persistent atrial fibrillation ([place])    a. noted on ICD interrogation '10 - not previously on [place] - CHA2DS2VASc = 5.  . Type II diabetes mellitus ([place])    uncontrolled    Past Surgical History:  Procedure Laterality Date  . CARDIAC DEFIBRILLATOR PLACEMENT  2007   implanted in [place], has a 7001 RV lead and a SJM Atlas ICD followed by Dr Caryl Comes  . CARDIOVERSION N/A 09/29/2016   Procedure: CARDIOVERSION;  Surgeon: Lelon Perla, MD;  Location[facility] ENDOSCOPY;  Service: Cardiovascular;  Laterality: N/A;  . CARDIOVERSION N/A 12/03/2016   Procedure: CARDIOVERSION;  Surgeon: Dorothy Spark, MD;  Location: [place];  Service: Cardiovascular;  Laterality: N/A;  . IMPLANTABLE CARDIOVERTER DEFIBRILLATOR (ICD) GENERATOR CHANGE N/A 03/19/2013   SJM Fortify ST DR generator placed by Dr Lovena Le, part of Analyze ST study  . LEFT AND RIGHT HEART CATHETERIZATION WITH CORONARY ANGIOGRAM N/A 01/14/2015   Procedure: LEFT AND RIGHT HEART CATHETERIZATION WITH CORONARY ANGIOGRAM;  Surgeon: Peter M Martinique, MD;  Location: [facility] CATH LAB;  Service: Cardiovascular;  Laterality: N/A;  . LEFT HEART CATHETERIZATION WITH CORONARY ANGIOGRAM N/A 02/17/2012   Procedure: LEFT HEART CATHETERIZATION WITH CORONARY ANGIOGRAM;  Surgeon: Jolaine Artist, MD;  Location[facility] CATH LAB;  Service: Cardiovascular;  Laterality: N/A;  . MITRAL VALVE ANNULOPLASTY  2004   Archie Endo 02/17/2012  . TEE WITHOUT CARDIOVERSION N/A 09/29/2016   Procedure: TRANSESOPHAGEAL ECHOCARDIOGRAM (TEE);  Surgeon: Lelon Perla, MD;  Location: [facility] ENDOSCOPY;  Service: Cardiovascular;   Laterality: N/A;  . TEE WITHOUT CARDIOVERSION N/A 12/03/2016   Procedure: TRANSESOPHAGEAL ECHOCARDIOGRAM (TEE);  Surgeon: Dorothy Spark, MD;  Location: [facility] ENDOSCOPY;  Service: Cardiovascular;  Laterality: N/A;    Current Outpatient Medications  Medication Sig Dispense Refill  . ALPRAZolam (XANAX) 1 MG tablet Take 1 tablet (1 mg total) by mouth 2 (two) times daily as needed for anxiety. 10 tablet 0  . amiodarone (PACERONE) 200 MG tablet Take 1 tablet (200 mg total) by mouth daily. <PLEASE MAKE APPOINTMENT FOR REFILLS> 30 tablet 0  . apixaban (ELIQUIS) 5 MG TABS tablet Take 1 tablet (5 mg total) by mouth 2 (two) times daily. 60 tablet 1  . atorvastatin (LIPITOR) 40 MG tablet Take 1 tablet (40 mg total) by mouth daily at 6 PM. 30 tablet 0  . furosemide (LASIX) 40 MG tablet Take by mouth.    . insulin aspart (NOVOLOG FLEXPEN) 100 UNIT/ML FlexPen Inject  6 Units into the skin 3 (three) times daily with meals. 15 mL 0  . Insulin Glargine (BASAGLAR KWIKPEN) 100 UNIT/ML SOPN Inject 0.5 mLs (50 Units total) into the skin daily. 15 mL 1  . metoprolol succinate (TOPROL-XL) 25 MG 24 hr tablet Take 1 tablet (25 mg total) by mouth daily. 30 tablet 0  . nitroGLYCERIN (NITROSTAT) 0.4 MG SL tablet Place 1 tablet (0.4 mg total) under the tongue every 5 (five) minutes x 3 doses as needed for chest pain. 10 tablet 0  . pantoprazole (PROTONIX) 40 MG tablet Take 1 tablet (40 mg total) by mouth daily. 30 tablet 1  . potassium chloride SA (KLOR-CON) 20 MEQ tablet Take by mouth.    . tamsulosin (FLOMAX) 0.4 MG CAPS capsule Take 1 capsule (0.4 mg total) by mouth daily. 30 capsule 0   No current facility-administered medications for this visit.    Allergies:   Liraglutide and Lisinopril   Social History:  The patient  reports that he has never smoked. He has never used smokeless tobacco. He reports current alcohol use. He reports that he does not use drugs.   Family History:  The patient's family history includes  Colon cancer in his mother and sister; Coronary artery disease in his brother and mother; Heart attack in his mother; Heart disease in his brother and mother; Heart failure in his mother; Hypertension in his mother; Malignant hyperthermia in his mother.  ROS:  Please see the history of present illness.    All other systems are reviewed and otherwise negative.   PHYSICAL EXAM:  VS:  There were no vitals taken for this visit. BMI: There is no height or weight on file to calculate BMI. Well nourished, well developed, in no acute distress  HEENT: normocephalic, atraumatic  Neck: no JVD, carotid bruits or masses Cardiac:  irreg-irreg; no significant murmurs, no rubs, or gallops Lungs:  CTA b/l, no wheezing, rhonchi or rales  Abd: soft, nontender MS: no deformity or atrophy Ext: trace if any edema b/l Skin: warm and dry, no rash Neuro:  No gross deficits appreciated Psych: euthymic mood, full affect  ICD site is stable, no tethering or discomfort   EKG not done today  ICD interrogation done today and reviewed by myself  Battery estimate is 2.3 - 2.7 years Lead measurements are good VP 1.7% AFib burden in the extended diagnosttics looks 100% HR not completely controlled, 70's-110's, some faster He has had some episodes called SVT that are rapid Afib rates 140's 10/21/2019 had a treated VF episode inappropriately for rapid Afib with 30J  (this timing "sounds about right" to the patientfor when he thought he was shocked 04/01/2019 VT episode (monitored) again is rapid AF  I reviewed the treated episode, called VF with Dr. Lovena Le Agreed was inappropriate for AFib, recommended programming changes VF zone from 187bpm > 200bpm  25 intervals >40   Echocardiogram 01/06/20: 1. Left ventricular ejection fraction, by visual estimation, is 25 to 30%. The left ventricle has severely decreased function. There is mildly increased left ventricular hypertrophy. 2. The left ventricle demonstrates  global hypokinesis. 3. Global right ventricle has mildly reduced systolic function.The right ventricular size is normal. No increase in right ventricular wall thickness. 4. Left atrial size was severely dilated. 5. Right atrial size was mildly dilated. 6. The mitral valve has been repaired/replaced. Mild mitral valve regurgitation. 7. The tricuspid valve is grossly normal. 8. The tricuspid valve is grossly normal. Tricuspid valve regurgitation is mild. 9. The  aortic valve is tricuspid. Aortic valve regurgitation is not visualized. 10. The pulmonic valve was grossly normal. Pulmonic valve regurgitation is trivial. 11. Aortic dilatation noted. 12. There is mild dilatation of the aortic root and of the ascending aorta. 13. Normal pulmonary artery systolic pressure. 14. A pacer wire is visualized. 15. The inferior vena cava is normal in size with greater than 50% respiratory variability, suggesting right atrial pressure of 3 mmHg.    12/03/16: TEE Study Conclusions - Left ventricle: Systolic function was severely reduced. The   estimated ejection fraction was in the range of 25% to 30%.   Diffuse hypokinesis. - Aortic valve: Structurally normal valve. Trileaflet; normal   thickness leaflets. There was no significant regurgitation. - Aorta: There was moderate non-mobile atheroma. - Ascending aorta: The ascending aorta was mildly dilated measuring   40 mm. - Descending aorta: The descending aorta was normal in size. - Mitral valve: There was mild to moderate regurgitation. No   paravalvular leak. - Left atrium: The atrium was dilated. No evidence of thrombus in   the atrial cavity or appendage. No evidence of thrombus in the   atrial cavity or appendage. No evidence of thrombus in the   appendage. - Right ventricle: Systolic function was mildly reduced. - Right atrium: No evidence of thrombus in the atrial cavity or   appendage. - Atrial septum: No defect or patent foramen ovale  was identified. - Tricuspid valve: There was mild regurgitation. - Pericardium, extracardiac: There was no pericardial effusion. Impressions: - TEE was followed by a successful cardioversion.  Recent Labs: 01/04/2020: B Natriuretic Peptide 165.2 01/06/2020: ALT 22 01/07/2020: Magnesium 1.8 01/08/2020: Hemoglobin 13.0; Platelets 173 02/07/2020: BUN 20; Creatinine, Ser 1.48; Potassium 3.8; Sodium 141  01/06/2020: Cholesterol 124; HDL 32; LDL Cholesterol 63; Total CHOL/HDL Ratio 3.9; Triglycerides 147; VLDL 29   Estimated Creatinine Clearance: 59.8 mL/min (A) (by C-G formula based on SCr of 1.48 mg/dL (H)).   Wt Readings from Last 3 Encounters:  02/07/20 245 lb (111.1 kg)  01/15/20 245 lb (111.1 kg)  01/08/20 233 lb 6.4 oz (105.9 kg)     Other studies reviewed: Additional studies/records reviewed today include: summarized above  ASSESSMENT AND PLAN:  1. Longstanding persistent AFib, atypical Aflutter     CHA2DS2Vasc is at least 5, on Eliquis, appropriately dosed (changed from warfarin at his last hospitalization)  He is on amiodarone for his AFib (I do not see a clear hx of VT/VF or appropriate therapies, but at his last hospitalization, cardiology notes mention h/o VT) He is though having HR getting to his VT monitor zone and has had inappropriate therapy for his AF w/RVR Will double his metoprolol, continue amio for now TSH today, LFTs recently were ok      2. CAD     No anginal complaints     On ASA, statin, BB     C/w Dr. Geraldo Pitter   3. ICM w/ICD     Programming changes at the recommendation of Dr. Lovena Le as noted above     Device is on advisory, he has not sent a remote in over a year, moved and unknown where his transmitter is     Device clinic today has confirmed his address and is ordering him a new transmitter     He was reminded of the importance of this and to call us when he gets his transmitter, or if he does not get it.  4. Chronic combined CHF     His weight  is up,  though I do not appreciate and symptoms or exam findings of acute volume increase or OL today  5. HTN     There is room for more metoprolol       Disposition: I have asked him to return in 1 mo, further titration of his BB if BP allows, decision on ongoing amiodarone or not, and make sure he has transmitter.     Current medicines are reviewed at length with the patient today.  The patient did not have any concerns regarding medicines.  Haywood Lasso, PA-C 02/08/2020 6:07 AM     [facility] [address] [place] [address] [phone] (office)  [phone] (fax)

## 2020-02-09 LAB — TSH: TSH: 8.47 u[IU]/mL — ABNORMAL HIGH (ref 0.450–4.500)

## 2020-02-12 ENCOUNTER — Other Ambulatory Visit: Payer: Self-pay | Admitting: *Deleted

## 2020-02-12 DIAGNOSIS — R7989 Other specified abnormal findings of blood chemistry: Secondary | ICD-10-CM

## 2020-02-15 ENCOUNTER — Other Ambulatory Visit: Payer: Self-pay

## 2020-02-15 ENCOUNTER — Other Ambulatory Visit: Payer: Medicare HMO | Admitting: *Deleted

## 2020-02-15 DIAGNOSIS — R7989 Other specified abnormal findings of blood chemistry: Secondary | ICD-10-CM

## 2020-02-16 LAB — T4, FREE: Free T4: 1.27 ng/dL (ref 0.82–1.77)

## 2020-02-16 LAB — TSH: TSH: 8.1 u[IU]/mL — ABNORMAL HIGH (ref 0.450–4.500)

## 2020-02-16 LAB — T3, FREE: T3, Free: 2.3 pg/mL (ref 2.0–4.4)

## 2020-02-21 ENCOUNTER — Telehealth: Payer: Self-pay | Admitting: Internal Medicine

## 2020-02-21 DIAGNOSIS — R7989 Other specified abnormal findings of blood chemistry: Secondary | ICD-10-CM

## 2020-02-21 NOTE — Telephone Encounter (Signed)
Spoke with patient and aware of results with understanding also to Stop Amiodarone and return for labs and will return on  03-24-20 for repeat TSH

## 2020-02-21 NOTE — Telephone Encounter (Signed)
Patient states he is returning call to follow up in regards to coming back in to the office for redraw of labs. Please return call and advise.

## 2020-02-22 ENCOUNTER — Other Ambulatory Visit: Payer: Medicare HMO

## 2020-03-07 ENCOUNTER — Encounter: Payer: Self-pay | Admitting: Cardiology

## 2020-03-07 ENCOUNTER — Ambulatory Visit (INDEPENDENT_AMBULATORY_CARE_PROVIDER_SITE_OTHER): Payer: Medicare HMO | Admitting: Cardiology

## 2020-03-07 ENCOUNTER — Other Ambulatory Visit: Payer: Self-pay

## 2020-03-07 VITALS — BP 146/90 | HR 116 | Ht 71.0 in | Wt 237.0 lb

## 2020-03-07 DIAGNOSIS — I10 Essential (primary) hypertension: Secondary | ICD-10-CM | POA: Diagnosis not present

## 2020-03-07 DIAGNOSIS — I255 Ischemic cardiomyopathy: Secondary | ICD-10-CM | POA: Diagnosis not present

## 2020-03-07 DIAGNOSIS — I2510 Atherosclerotic heart disease of native coronary artery without angina pectoris: Secondary | ICD-10-CM | POA: Diagnosis not present

## 2020-03-07 DIAGNOSIS — Z955 Presence of coronary angioplasty implant and graft: Secondary | ICD-10-CM

## 2020-03-07 DIAGNOSIS — I4891 Unspecified atrial fibrillation: Secondary | ICD-10-CM | POA: Diagnosis not present

## 2020-03-07 DIAGNOSIS — N189 Chronic kidney disease, unspecified: Secondary | ICD-10-CM

## 2020-03-07 DIAGNOSIS — U071 COVID-19: Secondary | ICD-10-CM

## 2020-03-07 MED ORDER — METOPROLOL SUCCINATE ER 100 MG PO TB24: 100.0000 mg | ORAL_TABLET | Freq: Every day | ORAL | 3 refills | Status: AC

## 2020-03-07 NOTE — H&P (View-Only) (Signed)
Cardiology Office Note:    Date:  03/07/2020   ID:  [name] [name], DOB May 03, 1951, MRN [id number]  PCP:  [name], PA  Cardiologist:  Jenean Lindau, MD   Referring MD: [name], PA    ASSESSMENT:    1. Essential hypertension   2. Coronary artery disease involving native coronary artery of native heart without angina pectoris   3. Atrial fibrillation, unspecified type ([place])   4. Ischemic cardiomyopathy   5. S/P coronary artery stent placement   6. COVID-19   7. Chronic kidney disease, unspecified CKD stage    PLAN:    In order of problems listed above:  1. Atrial fibrillation with elevated ventricular rate: I discussed this with my findings with the patient at extensive length.  In view of this I will double the beta-blocker.  Patient is on amiodarone.  I will increase amiodarone dosage but I worry about defibrillator and its thresholds.  I would leave it up to my electrophysiology colleagues to manipulate this.  I am going to have him be evaluated by our EP colleagues in the atrial fibrillation clinic in the next few days.  He will have a Chem-7 today. 2. Cardiomyopathy: Patient has defibrillator and is on guideline directed medical therapy. 3. Essential hypertension: Blood pressure is elevated and hopefully the increase in beta-blocker will help this. 4. He will be seen in follow-up appointment in a month or earlier if he has any concerns.  He knows to go to the nearest emergency room for any concerning symptoms.   Medication Adjustments/Labs and Tests Ordered: Current medicines are reviewed at length with the patient today.  Concerns regarding medicines are outlined above.  Orders Placed This Encounter  Procedures  . Basic metabolic panel  . EKG 12-Lead   Meds ordered this encounter  Medications  . metoprolol succinate (TOPROL-XL) 100 MG 24 hr tablet    Sig: Take 1 tablet (100 mg total) by mouth daily.    Dispense:  90 tablet    Refill:  3      Chief Complaint  Patient presents with  . Follow-up    1 Month     History of Present Illness:    [name] [name] is a 69 y.o. male.  Patient has past medical history of coronary artery disease, cardiomyopathy post defibrillator, essential hypertension and dyslipidemia.  He is also diabetic.  He denies any significant problems with except dyspnea on exertion.  No chest pain orthopnea or PND.  At the time of my evaluation, the patient is alert awake oriented and in no distress.  Past Medical History:  Diagnosis Date  . Anxiety   . Arthritis   . CAD (coronary artery disease)    a.  1993 s/p MI - [employer];  b. s/p BMS to LAD '00;  c. PTCA 2nd diagonal 2010;  d. 02/18/12 Cath: moderate nonobs dzs - med rx;  e.  01/2015 Cath: LM nl, LAD 40-12m ISR, 70-26m/d, d1 90p (3.0x16 Synergy DES), D2 50-60, LCX nl, OM1 50p, 64m (2.5x12 Synergy DES), RCA nl, EF 30-35%.  . Chronic combined systolic and diastolic CHF (congestive heart failure) ([place])    a. 12/2014 Echo: EF 30-35%, Gr2 DD, mod MR, sev dil LA.  . CKD (chronic kidney disease), stage III   . Depression   . ED (erectile dysfunction)   . GERD (gastroesophageal reflux disease)   . Hyperlipidemia   . Hypertension   . Ischemic cardiomyopathy    a. s/p St.  [name] (Atlas) ICD implanted in [place] 2007;  b. 12/2014 Echo: Ef 30-35%.  . MVP (mitral valve prolapse)    a. s/p MV annuloplasty at [facility] 2004.  Marland Kitchen Persistent atrial fibrillation ([place])    a. noted on ICD interrogation '10 - not previously on [place] - CHA2DS2VASc = 5.  . Type II diabetes mellitus ([place])    uncontrolled    Past Surgical History:  Procedure Laterality Date  . CARDIAC DEFIBRILLATOR PLACEMENT  2007   implanted in [place], has a 7001 RV lead and a SJM Atlas ICD followed by Dr Caryl Comes  . CARDIOVERSION N/A 09/29/2016   Procedure: CARDIOVERSION;  Surgeon: Lelon Perla, MD;  Location[facility] ENDOSCOPY;  Service: Cardiovascular;  Laterality: N/A;  . CARDIOVERSION  N/A 12/03/2016   Procedure: CARDIOVERSION;  Surgeon: Dorothy Spark, MD;  Location: [place];  Service: Cardiovascular;  Laterality: N/A;  . IMPLANTABLE CARDIOVERTER DEFIBRILLATOR (ICD) GENERATOR CHANGE N/A 03/19/2013   SJM Fortify ST DR generator placed by Dr Lovena Le, part of Analyze ST study  . LEFT AND RIGHT HEART CATHETERIZATION WITH CORONARY ANGIOGRAM N/A 01/14/2015   Procedure: LEFT AND RIGHT HEART CATHETERIZATION WITH CORONARY ANGIOGRAM;  Surgeon: Peter M Martinique, MD;  Location: [facility] CATH LAB;  Service: Cardiovascular;  Laterality: N/A;  . LEFT HEART CATHETERIZATION WITH CORONARY ANGIOGRAM N/A 02/17/2012   Procedure: LEFT HEART CATHETERIZATION WITH CORONARY ANGIOGRAM;  Surgeon: Jolaine Artist, MD;  Location: [facility] C[facility] LAB;  Service: Cardiovascular;  Laterality: N/A;  . MITRAL VALVE ANNULOPLASTY  2004   Archie Endo 02/17/2012  . TEE WITHOUT CARDIOVERSION N/A 09/29/2016   Procedure: TRANSESOPHAGEAL ECHOCARDIOGRAM (TEE);  Surgeon: Lelon Perla, MD;  Location: [facility] ENDOSCOPY;  Service: Cardiovascular;  Laterality: N/A;  . TEE WITHOUT CARDIOVERSION N/A 12/03/2016   Procedure: TRANSESOPHAGEAL ECHOCARDIOGRAM (TEE);  Surgeon: Dorothy Spark, MD;  Location: [facility] ENDOSCOPY;  Service: Cardiovascular;  Laterality: N/A;    Current Medications: Current Meds  Medication Sig  . ALPRAZolam (XANAX) 0.25 MG tablet   . ALPRAZolam (XANAX) 1 MG tablet Take 1 tablet (1 mg total) by mouth 2 (two) times daily as needed for anxiety.  Marland Kitchen amiodarone (PACERONE) 200 MG tablet   . apixaban (ELIQUIS) 5 MG TABS tablet Take 1 tablet (5 mg total) by mouth 2 (two) times daily.  Marland Kitchen atorvastatin (LIPITOR) 40 MG tablet Take 1 tablet (40 mg total) by mouth daily at 6 PM.  . citalopram (CELEXA) 20 MG tablet   . furosemide (LASIX) 40 MG tablet Take by mouth.  . insulin aspart (NOVOLOG FLEXPEN) 100 UNIT/ML FlexPen Inject 6 Units into the skin 3 (three) times daily with meals.  . Insulin Glargine (BASAGLAR KWIKPEN) 100 UNIT/ML SOPN  Inject 0.5 mLs (50 Units total) into the skin daily.  . metoprolol succinate (TOPROL-XL) 100 MG 24 hr tablet Take 1 tablet (100 mg total) by mouth daily.  . nitroGLYCERIN (NITROSTAT) 0.4 MG SL tablet Place 1 tablet (0.4 mg total) under the tongue every 5 (five) minutes x 3 doses as needed for chest pain.  . pantoprazole (PROTONIX) 40 MG tablet Take 1 tablet (40 mg total) by mouth daily.  . potassium chloride SA (KLOR-CON) 20 MEQ tablet Take by mouth.  . tamsulosin (FLOMAX) 0.4 MG CAPS capsule Take 1 capsule (0.4 mg total) by mouth daily.  . [DISCONTINUED] metoprolol succinate (TOPROL-XL) 50 MG 24 hr tablet Take 1 tablet (50 mg total) by mouth daily.     Allergies:   Liraglutide and Lisinopril   Social History   Socioeconomic  History  . Marital status: Married    Spouse name: Not on file  . Number of children: 8  . Years of education: Not on file  . Highest education level: Not on file  Occupational History  . Occupation: Community education officer    Comment: disabled  Tobacco Use  . Smoking status: Never Smoker  . Smokeless tobacco: Never Used  Substance and Sexual Activity  . Alcohol use: Yes    Alcohol/week: 0.0 standard drinks    Comment: 01/14/2015 "used to drink alot; stopped completely in 1983"  . Drug use: No    Comment: 01/14/2015 "used to use about anything I could get; stopped completely in 1983"  . Sexual activity: Never  Other Topics Concern  . Not on file  Social History Narrative  . Not on file   Social Determinants of Health   Financial Resource Strain:   . Difficulty of Paying Living Expenses:   Food Insecurity:   . Worried About Charity fundraiser in the Last Year:   . Arboriculturist in the Last Year:   Transportation Needs:   . Film/video editor (Medical):   Marland Kitchen Lack of Transportation (Non-Medical):   Physical Activity:   . Days of Exercise per Week:   . Minutes of Exercise per Session:   Stress:   . Feeling of Stress :   Social Connections:   . Frequency of  Communication with Friends and Family:   . Frequency of Social Gatherings with Friends and Family:   . Attends Religious Services:   . Active Member of Clubs or Organizations:   . Attends Archivist Meetings:   Marland Kitchen Marital Status:      Family History: The patient's family history includes Colon cancer in his mother and sister; Coronary artery disease in his brother and mother; Heart attack in his mother; Heart disease in his brother and mother; Heart failure in his mother; Hypertension in his mother; Malignant hyperthermia in his mother.  ROS:   Please see the history of present illness.    All other systems reviewed and are negative.  EKGs/Labs/Other Studies Reviewed:    The following studies were reviewed today: EKG reveals atrial fibrillation with elevated ventricular rate   Recent Labs: 01/04/2020: B Natriuretic Peptide 165.2 01/06/2020: ALT 22 01/07/2020: Magnesium 1.8 01/08/2020: Hemoglobin 13.0; Platelets 173 02/07/2020: BUN 20; Creatinine, Ser 1.48; Potassium 3.8; Sodium 141 02/15/2020: TSH 8.100  Recent Lipid Panel    Component Value Date/Time   CHOL 124 01/06/2020 0532   TRIG 147 01/06/2020 0532   HDL 32 (L) 01/06/2020 0532   CHOLHDL 3.9 01/06/2020 0532   VLDL 29 01/06/2020 0532   LDLCALC 63 01/06/2020 0532    Physical Exam:    VS:  BP (!) 146/90   Pulse (!) 116   Ht 5\' 11"  (1.803 m)   Wt 237 lb (107.5 kg)   SpO2 95%   BMI 33.05 kg/m     Wt Readings from Last 3 Encounters:  03/07/20 237 lb (107.5 kg)  02/08/20 254 lb (115.2 kg)  02/07/20 245 lb (111.1 kg)     GEN: Patient is in no acute distress HEENT: Normal NECK: No JVD; No carotid bruits LYMPHATICS: No lymphadenopathy CARDIAC: Hear sounds regular, 2/6 systolic murmur at the apex. RESPIRATORY:  Clear to auscultation without rales, wheezing or rhonchi  ABDOMEN: Soft, non-tender, non-distended MUSCULOSKELETAL:  No edema; No deformity  SKIN: Warm and dry NEUROLOGIC:  Alert and oriented x  3 PSYCHIATRIC:  Normal  affect   Signed, Jenean Lindau, MD  03/07/2020 2:58 PM    [place]

## 2020-03-07 NOTE — Progress Notes (Signed)
Cardiology Office Note:    Date:  03/07/2020   ID:  [name] [name], DOB 10/20/1951, MRN [id number]  PCP:  [name], PA  Cardiologist:  Jenean Lindau, MD   Referring MD: [name], PA    ASSESSMENT:    1. Essential hypertension   2. Coronary artery disease involving native coronary artery of native heart without angina pectoris   3. Atrial fibrillation, unspecified type ([place])   4. Ischemic cardiomyopathy   5. S/P coronary artery stent placement   6. COVID-19   7. Chronic kidney disease, unspecified CKD stage    PLAN:    In order of problems listed above:  1. Atrial fibrillation with elevated ventricular rate: I discussed this with my findings with the patient at extensive length.  In view of this I will double the beta-blocker.  Patient is on amiodarone.  I will increase amiodarone dosage but I worry about defibrillator and its thresholds.  I would leave it up to my electrophysiology colleagues to manipulate this.  I am going to have him be evaluated by our EP colleagues in the atrial fibrillation clinic in the next few days.  He will have a Chem-7 today. 2. Cardiomyopathy: Patient has defibrillator and is on guideline directed medical therapy. 3. Essential hypertension: Blood pressure is elevated and hopefully the increase in beta-blocker will help this. 4. He will be seen in follow-up appointment in a month or earlier if he has any concerns.  He knows to go to the nearest emergency room for any concerning symptoms.   Medication Adjustments/Labs and Tests Ordered: Current medicines are reviewed at length with the patient today.  Concerns regarding medicines are outlined above.  Orders Placed This Encounter  Procedures  . Basic metabolic panel  . EKG 12-Lead   Meds ordered this encounter  Medications  . metoprolol succinate (TOPROL-XL) 100 MG 24 hr tablet    Sig: Take 1 tablet (100 mg total) by mouth daily.    Dispense:  90 tablet    Refill:  3      Chief Complaint  Patient presents with  . Follow-up    1 Month     History of Present Illness:    [name] [name] is a 69 y.o. male.  Patient has past medical history of coronary artery disease, cardiomyopathy post defibrillator, essential hypertension and dyslipidemia.  He is also diabetic.  He denies any significant problems with except dyspnea on exertion.  No chest pain orthopnea or PND.  At the time of my evaluation, the patient is alert awake oriented and in no distress.  Past Medical History:  Diagnosis Date  . Anxiety   . Arthritis   . CAD (coronary artery disease)    a.  1993 s/p MI - [employer];  b. s/p BMS to LAD '00;  c. PTCA 2nd diagonal 2010;  d. 02/18/12 Cath: moderate nonobs dzs - med rx;  e.  01/2015 Cath: LM nl, LAD 40-77m ISR, 70-47m/d, d1 90p (3.0x16 Synergy DES), D2 50-60, LCX nl, OM1 50p, 85m (2.5x12 Synergy DES), RCA nl, EF 30-35%.  . Chronic combined systolic and diastolic CHF (congestive heart failure) ([place])    a. 12/2014 Echo: EF 30-35%, Gr2 DD, mod MR, sev dil LA.  . CKD (chronic kidney disease), stage III   . Depression   . ED (erectile dysfunction)   . GERD (gastroesophageal reflux disease)   . Hyperlipidemia   . Hypertension   . Ischemic cardiomyopathy    a. s/p St.  [name] (Atlas) ICD implanted in [place] 2007;  b. 12/2014 Echo: Ef 30-35%.  . MVP (mitral valve prolapse)    a. s/p MV annuloplasty at [facility] 2004.  Marland Kitchen Persistent atrial fibrillation ([place])    a. noted on ICD interrogation '10 - not previously on [place] - CHA2DS2VASc = 5.  . Type II diabetes mellitus ([place])    uncontrolled    Past Surgical History:  Procedure Laterality Date  . CARDIAC DEFIBRILLATOR PLACEMENT  2007   implanted in [place], has a 7001 RV lead and a SJM Atlas ICD followed by Dr Caryl Comes  . CARDIOVERSION N/A 09/29/2016   Procedure: CARDIOVERSION;  Surgeon: Lelon Perla, MD;  Location: [facility] ENDOSCOPY;  Service: Cardiovascular;  Laterality: N/A;  . CARDIOVERSION  N/A 12/03/2016   Procedure: CARDIOVERSION;  Surgeon: Dorothy Spark, MD;  Location: [place];  Service: Cardiovascular;  Laterality: N/A;  . IMPLANTABLE CARDIOVERTER DEFIBRILLATOR (ICD) GENERATOR CHANGE N/A 03/19/2013   SJM Fortify ST DR generator placed by Dr Lovena Le, part of Analyze ST study  . LEFT AND RIGHT HEART CATHETERIZATION WITH CORONARY ANGIOGRAM N/A 01/14/2015   Procedure: LEFT AND RIGHT HEART CATHETERIZATION WITH CORONARY ANGIOGRAM;  Surgeon: Peter M Martinique, MD;  Location: [facility] CATH LAB;  Service: Cardiovascular;  Laterality: N/A;  . LEFT HEART CATHETERIZATION WITH CORONARY ANGIOGRAM N/A 02/17/2012   Procedure: LEFT HEART CATHETERIZATION WITH CORONARY ANGIOGRAM;  Surgeon: Jolaine Artist, MD;  Location: [facility] CATH LAB;  Service: Cardiovascular;  Laterality: N/A;  . MITRAL VALVE ANNULOPLASTY  2004   Archie Endo 02/17/2012  . TEE WITHOUT CARDIOVERSION N/A 09/29/2016   Procedure: TRANSESOPHAGEAL ECHOCARDIOGRAM (TEE);  Surgeon: Lelon Perla, MD;  Location: [facility] ENDOSCOPY;  Service: Cardiovascular;  Laterality: N/A;  . TEE WITHOUT CARDIOVERSION N/A 12/03/2016   Procedure: TRANSESOPHAGEAL ECHOCARDIOGRAM (TEE);  Surgeon: Dorothy Spark, MD;  Location: [facility] ENDOSCOPY;  Service: Cardiovascular;  Laterality: N/A;    Current Medications: Current Meds  Medication Sig  . ALPRAZolam (XANAX) 0.25 MG tablet   . ALPRAZolam (XANAX) 1 MG tablet Take 1 tablet (1 mg total) by mouth 2 (two) times daily as needed for anxiety.  Marland Kitchen amiodarone (PACERONE) 200 MG tablet   . apixaban (ELIQUIS) 5 MG TABS tablet Take 1 tablet (5 mg total) by mouth 2 (two) times daily.  Marland Kitchen atorvastatin (LIPITOR) 40 MG tablet Take 1 tablet (40 mg total) by mouth daily at 6 PM.  . citalopram (CELEXA) 20 MG tablet   . furosemide (LASIX) 40 MG tablet Take by mouth.  . insulin aspart (NOVOLOG FLEXPEN) 100 UNIT/ML FlexPen Inject 6 Units into the skin 3 (three) times daily with meals.  . Insulin Glargine (BASAGLAR KWIKPEN) 100 UNIT/ML SOPN  Inject 0.5 mLs (50 Units total) into the skin daily.  . metoprolol succinate (TOPROL-XL) 100 MG 24 hr tablet Take 1 tablet (100 mg total) by mouth daily.  . nitroGLYCERIN (NITROSTAT) 0.4 MG SL tablet Place 1 tablet (0.4 mg total) under the tongue every 5 (five) minutes x 3 doses as needed for chest pain.  . pantoprazole (PROTONIX) 40 MG tablet Take 1 tablet (40 mg total) by mouth daily.  . potassium chloride SA (KLOR-CON) 20 MEQ tablet Take by mouth.  . tamsulosin (FLOMAX) 0.4 MG CAPS capsule Take 1 capsule (0.4 mg total) by mouth daily.  . [DISCONTINUED] metoprolol succinate (TOPROL-XL) 50 MG 24 hr tablet Take 1 tablet (50 mg total) by mouth daily.     Allergies:   Liraglutide and Lisinopril   Social History   Socioeconomic  History  . Marital status: Married    Spouse name: Not on file  . Number of children: 8  . Years of education: Not on file  . Highest education level: Not on file  Occupational History  . Occupation: Community education officer    Comment: disabled  Tobacco Use  . Smoking status: Never Smoker  . Smokeless tobacco: Never Used  Substance and Sexual Activity  . Alcohol use: Yes    Alcohol/week: 0.0 standard drinks    Comment: 01/14/2015 "used to drink alot; stopped completely in 1983"  . Drug use: No    Comment: 01/14/2015 "used to use about anything I could get; stopped completely in 1983"  . Sexual activity: Never  Other Topics Concern  . Not on file  Social History Narrative  . Not on file   Social Determinants of Health   Financial Resource Strain:   . Difficulty of Paying Living Expenses:   Food Insecurity:   . Worried About Charity fundraiser in the Last Year:   . Arboriculturist in the Last Year:   Transportation Needs:   . Film/video editor (Medical):   Marland Kitchen Lack of Transportation (Non-Medical):   Physical Activity:   . Days of Exercise per Week:   . Minutes of Exercise per Session:   Stress:   . Feeling of Stress :   Social Connections:   . Frequency of  Communication with Friends and Family:   . Frequency of Social Gatherings with Friends and Family:   . Attends Religious Services:   . Active Member of Clubs or Organizations:   . Attends Archivist Meetings:   Marland Kitchen Marital Status:      Family History: The patient's family history includes Colon cancer in his mother and sister; Coronary artery disease in his brother and mother; Heart attack in his mother; Heart disease in his brother and mother; Heart failure in his mother; Hypertension in his mother; Malignant hyperthermia in his mother.  ROS:   Please see the history of present illness.    All other systems reviewed and are negative.  EKGs/Labs/Other Studies Reviewed:    The following studies were reviewed today: EKG reveals atrial fibrillation with elevated ventricular rate   Recent Labs: 01/04/2020: B Natriuretic Peptide 165.2 01/06/2020: ALT 22 01/07/2020: Magnesium 1.8 01/08/2020: Hemoglobin 13.0; Platelets 173 02/07/2020: BUN 20; Creatinine, Ser 1.48; Potassium 3.8; Sodium 141 02/15/2020: TSH 8.100  Recent Lipid Panel    Component Value Date/Time   CHOL 124 01/06/2020 0532   TRIG 147 01/06/2020 0532   HDL 32 (L) 01/06/2020 0532   CHOLHDL 3.9 01/06/2020 0532   VLDL 29 01/06/2020 0532   LDLCALC 63 01/06/2020 0532    Physical Exam:    VS:  BP (!) 146/90   Pulse (!) 116   Ht 5\' 11"  (1.803 m)   Wt 237 lb (107.5 kg)   SpO2 95%   BMI 33.05 kg/m     Wt Readings from Last 3 Encounters:  03/07/20 237 lb (107.5 kg)  02/08/20 254 lb (115.2 kg)  02/07/20 245 lb (111.1 kg)     GEN: Patient is in no acute distress HEENT: Normal NECK: No JVD; No carotid bruits LYMPHATICS: No lymphadenopathy CARDIAC: Hear sounds regular, 2/6 systolic murmur at the apex. RESPIRATORY:  Clear to auscultation without rales, wheezing or rhonchi  ABDOMEN: Soft, non-tender, non-distended MUSCULOSKELETAL:  No edema; No deformity  SKIN: Warm and dry NEUROLOGIC:  Alert and oriented x  3 PSYCHIATRIC:  Normal  affect   Signed, Jenean Lindau, MD  03/07/2020 2:58 PM    [place]

## 2020-03-07 NOTE — Patient Instructions (Signed)
Medication Instructions:  Your physician has recommended you make the following change in your medication:  Increase your Metolrolol succinate to 100 mg daily  *If you need a refill on your cardiac medications before your next appointment, please call your pharmacy*   Lab Work: You had a BMET today. If you have labs (blood work) drawn today and your tests are completely normal, you will receive your results only by: Marland Kitchen MyChart Message (if you have MyChart) OR . A paper copy in the mail If you have any lab test that is abnormal or we need to change your treatment, we will call you to review the results.   Testing/Procedures: None ordered   Follow-Up: At [facility], you and your health needs are our priority.  As part of our continuing mission to provide you with exceptional heart care, we have created designated Provider Care Teams.  These Care Teams include your primary Cardiologist (physician) and Advanced Practice Providers (APPs -  Physician Assistants and Nurse Practitioners) who all work together to provide you with the care you need, when you need it.  We recommend signing up for the patient portal called "MyChart".  Sign up information is provided on this After Visit Summary.  MyChart is used to connect with patients for Virtual Visits (Telemedicine).  Patients are able to view lab/test results, encounter notes, upcoming appointments, etc.  Non-urgent messages can be sent to your provider as well.   To learn more about what you can do with MyChart, go to [email or web address].    Your next appointment:   1 month(s)  The format for your next appointment:   In Person  Provider:   Jyl Heinz, MD   Other Instructions NA

## 2020-03-08 ENCOUNTER — Telehealth: Payer: Self-pay | Admitting: Internal Medicine

## 2020-03-08 LAB — BASIC METABOLIC PANEL
BUN/Creatinine Ratio: 10 (ref 10–24)
BUN: 16 mg/dL (ref 8–27)
CO2: 19 mmol/L — ABNORMAL LOW (ref 20–29)
Calcium: 9.4 mg/dL (ref 8.6–10.2)
Chloride: 95 mmol/L — ABNORMAL LOW (ref 96–106)
Creatinine, Ser: 1.55 mg/dL — ABNORMAL HIGH (ref 0.76–1.27)
GFR calc Af Amer: 52 mL/min/{1.73_m2} — ABNORMAL LOW (ref >59)
GFR calc non Af Amer: 45 mL/min/{1.73_m2} — ABNORMAL LOW (ref >59)
Glucose: 512 mg/dL (ref 65–99)
Potassium: 4.3 mmol/L (ref 3.5–5.2)
Sodium: 132 mmol/L — ABNORMAL LOW (ref 134–144)

## 2020-03-08 NOTE — Telephone Encounter (Signed)
Cardiology Moon[place] Note  Returned page from [place]. Patient's glucose 521 yesterday on BMP. Contacted patient. He states he often forgets to take his inulin. Didn't take any of his insulin all day yesterday. States he feels fine right now. He will get up and check his fingerstick glucose now. He will take his insulin as prescribed. He will contact on call cardiologist if his glucose is >400 on this morning's check. Will copy weekend on call cardiologist and ordering provider to this note.  Marcie Mowers, MD Cardiology Fellow, PGY-7

## 2020-03-10 NOTE — Telephone Encounter (Signed)
Let pcp know aap

## 2020-03-11 ENCOUNTER — Inpatient Hospital Stay (HOSPITAL_COMMUNITY)
Admission: EM | Admit: 2020-03-11 | Payer: Medicare HMO | Source: Other Acute Inpatient Hospital | Admitting: Internal Medicine

## 2020-03-11 DIAGNOSIS — I2510 Atherosclerotic heart disease of native coronary artery without angina pectoris: Secondary | ICD-10-CM

## 2020-03-11 DIAGNOSIS — I10 Essential (primary) hypertension: Secondary | ICD-10-CM

## 2020-03-11 DIAGNOSIS — E119 Type 2 diabetes mellitus without complications: Secondary | ICD-10-CM

## 2020-03-11 DIAGNOSIS — I249 Acute ischemic heart disease, unspecified: Secondary | ICD-10-CM

## 2020-03-11 DIAGNOSIS — I482 Chronic atrial fibrillation, unspecified: Secondary | ICD-10-CM

## 2020-03-11 DIAGNOSIS — I429 Cardiomyopathy, unspecified: Secondary | ICD-10-CM

## 2020-03-11 DIAGNOSIS — Z9889 Other specified postprocedural states: Secondary | ICD-10-CM

## 2020-03-12 ENCOUNTER — Encounter (HOSPITAL_COMMUNITY): Payer: Self-pay | Admitting: Cardiovascular Disease

## 2020-03-12 ENCOUNTER — Inpatient Hospital Stay (HOSPITAL_COMMUNITY)
Admission: RE | Admit: 2020-03-12 | Discharge: 2020-05-13 | DRG: 001 | Disposition: E | Payer: Medicare HMO | Source: Other Acute Inpatient Hospital | Attending: Cardiothoracic Surgery | Admitting: Cardiothoracic Surgery

## 2020-03-12 ENCOUNTER — Other Ambulatory Visit: Payer: Self-pay

## 2020-03-12 ENCOUNTER — Encounter (HOSPITAL_COMMUNITY): Admission: AD | Disposition: E | Payer: Self-pay | Attending: Cardiothoracic Surgery

## 2020-03-12 ENCOUNTER — Inpatient Hospital Stay (HOSPITAL_COMMUNITY): Payer: Medicare HMO

## 2020-03-12 DIAGNOSIS — D689 Coagulation defect, unspecified: Secondary | ICD-10-CM | POA: Diagnosis not present

## 2020-03-12 DIAGNOSIS — E875 Hyperkalemia: Secondary | ICD-10-CM | POA: Diagnosis not present

## 2020-03-12 DIAGNOSIS — Z20822 Contact with and (suspected) exposure to covid-19: Secondary | ICD-10-CM | POA: Diagnosis present

## 2020-03-12 DIAGNOSIS — R34 Anuria and oliguria: Secondary | ICD-10-CM | POA: Diagnosis not present

## 2020-03-12 DIAGNOSIS — R570 Cardiogenic shock: Secondary | ICD-10-CM | POA: Diagnosis not present

## 2020-03-12 DIAGNOSIS — R531 Weakness: Secondary | ICD-10-CM | POA: Diagnosis not present

## 2020-03-12 DIAGNOSIS — R188 Other ascites: Secondary | ICD-10-CM | POA: Diagnosis present

## 2020-03-12 DIAGNOSIS — Z978 Presence of other specified devices: Secondary | ICD-10-CM

## 2020-03-12 DIAGNOSIS — Z01810 Encounter for preprocedural cardiovascular examination: Secondary | ICD-10-CM | POA: Diagnosis not present

## 2020-03-12 DIAGNOSIS — R0681 Apnea, not elsewhere classified: Secondary | ICD-10-CM

## 2020-03-12 DIAGNOSIS — R471 Dysarthria and anarthria: Secondary | ICD-10-CM | POA: Diagnosis not present

## 2020-03-12 DIAGNOSIS — R1312 Dysphagia, oropharyngeal phase: Secondary | ICD-10-CM | POA: Diagnosis not present

## 2020-03-12 DIAGNOSIS — R627 Adult failure to thrive: Secondary | ICD-10-CM | POA: Diagnosis not present

## 2020-03-12 DIAGNOSIS — R7989 Other specified abnormal findings of blood chemistry: Secondary | ICD-10-CM | POA: Diagnosis not present

## 2020-03-12 DIAGNOSIS — J189 Pneumonia, unspecified organism: Secondary | ICD-10-CM | POA: Diagnosis not present

## 2020-03-12 DIAGNOSIS — R112 Nausea with vomiting, unspecified: Secondary | ICD-10-CM

## 2020-03-12 DIAGNOSIS — Z006 Encounter for examination for normal comparison and control in clinical research program: Secondary | ICD-10-CM

## 2020-03-12 DIAGNOSIS — R414 Neurologic neglect syndrome: Secondary | ICD-10-CM | POA: Diagnosis not present

## 2020-03-12 DIAGNOSIS — Z794 Long term (current) use of insulin: Secondary | ICD-10-CM

## 2020-03-12 DIAGNOSIS — T85598A Other mechanical complication of other gastrointestinal prosthetic devices, implants and grafts, initial encounter: Secondary | ICD-10-CM

## 2020-03-12 DIAGNOSIS — R791 Abnormal coagulation profile: Secondary | ICD-10-CM | POA: Diagnosis not present

## 2020-03-12 DIAGNOSIS — F05 Delirium due to known physiological condition: Secondary | ICD-10-CM | POA: Diagnosis not present

## 2020-03-12 DIAGNOSIS — G253 Myoclonus: Secondary | ICD-10-CM | POA: Diagnosis not present

## 2020-03-12 DIAGNOSIS — I959 Hypotension, unspecified: Secondary | ICD-10-CM | POA: Diagnosis not present

## 2020-03-12 DIAGNOSIS — Z992 Dependence on renal dialysis: Secondary | ICD-10-CM | POA: Diagnosis not present

## 2020-03-12 DIAGNOSIS — Y95 Nosocomial condition: Secondary | ICD-10-CM | POA: Diagnosis not present

## 2020-03-12 DIAGNOSIS — I313 Pericardial effusion (noninflammatory): Secondary | ICD-10-CM | POA: Diagnosis not present

## 2020-03-12 DIAGNOSIS — K819 Cholecystitis, unspecified: Secondary | ICD-10-CM

## 2020-03-12 DIAGNOSIS — Z5329 Procedure and treatment not carried out because of patient's decision for other reasons: Secondary | ICD-10-CM | POA: Diagnosis not present

## 2020-03-12 DIAGNOSIS — Z955 Presence of coronary angioplasty implant and graft: Secondary | ICD-10-CM

## 2020-03-12 DIAGNOSIS — Z4659 Encounter for fitting and adjustment of other gastrointestinal appliance and device: Secondary | ICD-10-CM

## 2020-03-12 DIAGNOSIS — I4821 Permanent atrial fibrillation: Secondary | ICD-10-CM | POA: Diagnosis present

## 2020-03-12 DIAGNOSIS — I5043 Acute on chronic combined systolic (congestive) and diastolic (congestive) heart failure: Secondary | ICD-10-CM | POA: Diagnosis not present

## 2020-03-12 DIAGNOSIS — I700 Atherosclerosis of aorta: Secondary | ICD-10-CM | POA: Diagnosis present

## 2020-03-12 DIAGNOSIS — J9600 Acute respiratory failure, unspecified whether with hypoxia or hypercapnia: Secondary | ICD-10-CM

## 2020-03-12 DIAGNOSIS — D62 Acute posthemorrhagic anemia: Secondary | ICD-10-CM | POA: Diagnosis not present

## 2020-03-12 DIAGNOSIS — Z515 Encounter for palliative care: Secondary | ICD-10-CM | POA: Diagnosis not present

## 2020-03-12 DIAGNOSIS — Z79899 Other long term (current) drug therapy: Secondary | ICD-10-CM

## 2020-03-12 DIAGNOSIS — Z9889 Other specified postprocedural states: Secondary | ICD-10-CM

## 2020-03-12 DIAGNOSIS — E43 Unspecified severe protein-calorie malnutrition: Secondary | ICD-10-CM | POA: Diagnosis not present

## 2020-03-12 DIAGNOSIS — I252 Old myocardial infarction: Secondary | ICD-10-CM

## 2020-03-12 DIAGNOSIS — N529 Male erectile dysfunction, unspecified: Secondary | ICD-10-CM | POA: Diagnosis present

## 2020-03-12 DIAGNOSIS — Z419 Encounter for procedure for purposes other than remedying health state, unspecified: Secondary | ICD-10-CM

## 2020-03-12 DIAGNOSIS — K0889 Other specified disorders of teeth and supporting structures: Secondary | ICD-10-CM | POA: Diagnosis present

## 2020-03-12 DIAGNOSIS — Z95811 Presence of heart assist device: Secondary | ICD-10-CM

## 2020-03-12 DIAGNOSIS — T383X6A Underdosing of insulin and oral hypoglycemic [antidiabetic] drugs, initial encounter: Secondary | ICD-10-CM | POA: Diagnosis present

## 2020-03-12 DIAGNOSIS — E118 Type 2 diabetes mellitus with unspecified complications: Secondary | ICD-10-CM | POA: Diagnosis not present

## 2020-03-12 DIAGNOSIS — I25110 Atherosclerotic heart disease of native coronary artery with unstable angina pectoris: Principal | ICD-10-CM | POA: Diagnosis present

## 2020-03-12 DIAGNOSIS — I4892 Unspecified atrial flutter: Secondary | ICD-10-CM | POA: Diagnosis not present

## 2020-03-12 DIAGNOSIS — I272 Pulmonary hypertension, unspecified: Secondary | ICD-10-CM | POA: Diagnosis present

## 2020-03-12 DIAGNOSIS — Z7901 Long term (current) use of anticoagulants: Secondary | ICD-10-CM

## 2020-03-12 DIAGNOSIS — J9811 Atelectasis: Secondary | ICD-10-CM | POA: Diagnosis not present

## 2020-03-12 DIAGNOSIS — I482 Chronic atrial fibrillation, unspecified: Secondary | ICD-10-CM

## 2020-03-12 DIAGNOSIS — Z95810 Presence of automatic (implantable) cardiac defibrillator: Secondary | ICD-10-CM

## 2020-03-12 DIAGNOSIS — I469 Cardiac arrest, cause unspecified: Secondary | ICD-10-CM | POA: Diagnosis not present

## 2020-03-12 DIAGNOSIS — Z23 Encounter for immunization: Secondary | ICD-10-CM

## 2020-03-12 DIAGNOSIS — G8194 Hemiplegia, unspecified affecting left nondominant side: Secondary | ICD-10-CM | POA: Diagnosis not present

## 2020-03-12 DIAGNOSIS — I63411 Cerebral infarction due to embolism of right middle cerebral artery: Secondary | ICD-10-CM | POA: Diagnosis not present

## 2020-03-12 DIAGNOSIS — E11649 Type 2 diabetes mellitus with hypoglycemia without coma: Secondary | ICD-10-CM | POA: Diagnosis not present

## 2020-03-12 DIAGNOSIS — I255 Ischemic cardiomyopathy: Secondary | ICD-10-CM | POA: Diagnosis present

## 2020-03-12 DIAGNOSIS — R482 Apraxia: Secondary | ICD-10-CM | POA: Diagnosis not present

## 2020-03-12 DIAGNOSIS — J95821 Acute postprocedural respiratory failure: Secondary | ICD-10-CM | POA: Diagnosis not present

## 2020-03-12 DIAGNOSIS — I2510 Atherosclerotic heart disease of native coronary artery without angina pectoris: Secondary | ICD-10-CM

## 2020-03-12 DIAGNOSIS — I429 Cardiomyopathy, unspecified: Secondary | ICD-10-CM | POA: Diagnosis not present

## 2020-03-12 DIAGNOSIS — K8020 Calculus of gallbladder without cholecystitis without obstruction: Secondary | ICD-10-CM | POA: Diagnosis present

## 2020-03-12 DIAGNOSIS — I639 Cerebral infarction, unspecified: Secondary | ICD-10-CM | POA: Diagnosis not present

## 2020-03-12 DIAGNOSIS — N186 End stage renal disease: Secondary | ICD-10-CM | POA: Diagnosis not present

## 2020-03-12 DIAGNOSIS — Z8616 Personal history of COVID-19: Secondary | ICD-10-CM

## 2020-03-12 DIAGNOSIS — R0902 Hypoxemia: Secondary | ICD-10-CM

## 2020-03-12 DIAGNOSIS — R29719 NIHSS score 19: Secondary | ICD-10-CM | POA: Diagnosis not present

## 2020-03-12 DIAGNOSIS — J9601 Acute respiratory failure with hypoxia: Secondary | ICD-10-CM | POA: Diagnosis not present

## 2020-03-12 DIAGNOSIS — Z01818 Encounter for other preprocedural examination: Secondary | ICD-10-CM

## 2020-03-12 DIAGNOSIS — I5023 Acute on chronic systolic (congestive) heart failure: Secondary | ICD-10-CM | POA: Diagnosis present

## 2020-03-12 DIAGNOSIS — R197 Diarrhea, unspecified: Secondary | ICD-10-CM | POA: Diagnosis not present

## 2020-03-12 DIAGNOSIS — I5022 Chronic systolic (congestive) heart failure: Secondary | ICD-10-CM

## 2020-03-12 DIAGNOSIS — I4819 Other persistent atrial fibrillation: Secondary | ICD-10-CM | POA: Diagnosis not present

## 2020-03-12 DIAGNOSIS — E11319 Type 2 diabetes mellitus with unspecified diabetic retinopathy without macular edema: Secondary | ICD-10-CM | POA: Diagnosis present

## 2020-03-12 DIAGNOSIS — I509 Heart failure, unspecified: Secondary | ICD-10-CM

## 2020-03-12 DIAGNOSIS — I480 Paroxysmal atrial fibrillation: Secondary | ICD-10-CM | POA: Diagnosis not present

## 2020-03-12 DIAGNOSIS — Z9911 Dependence on respirator [ventilator] status: Secondary | ICD-10-CM | POA: Diagnosis not present

## 2020-03-12 DIAGNOSIS — R092 Respiratory arrest: Secondary | ICD-10-CM | POA: Diagnosis not present

## 2020-03-12 DIAGNOSIS — I200 Unstable angina: Secondary | ICD-10-CM | POA: Diagnosis present

## 2020-03-12 DIAGNOSIS — D72829 Elevated white blood cell count, unspecified: Secondary | ICD-10-CM | POA: Diagnosis not present

## 2020-03-12 DIAGNOSIS — N179 Acute kidney failure, unspecified: Secondary | ICD-10-CM

## 2020-03-12 DIAGNOSIS — F319 Bipolar disorder, unspecified: Secondary | ICD-10-CM | POA: Diagnosis present

## 2020-03-12 DIAGNOSIS — Z8249 Family history of ischemic heart disease and other diseases of the circulatory system: Secondary | ICD-10-CM

## 2020-03-12 DIAGNOSIS — F419 Anxiety disorder, unspecified: Secondary | ICD-10-CM | POA: Diagnosis present

## 2020-03-12 DIAGNOSIS — J942 Hemothorax: Secondary | ICD-10-CM | POA: Diagnosis not present

## 2020-03-12 DIAGNOSIS — Z7189 Other specified counseling: Secondary | ICD-10-CM

## 2020-03-12 DIAGNOSIS — I132 Hypertensive heart and chronic kidney disease with heart failure and with stage 5 chronic kidney disease, or end stage renal disease: Secondary | ICD-10-CM | POA: Diagnosis present

## 2020-03-12 DIAGNOSIS — I361 Nonrheumatic tricuspid (valve) insufficiency: Secondary | ICD-10-CM | POA: Diagnosis not present

## 2020-03-12 DIAGNOSIS — R569 Unspecified convulsions: Secondary | ICD-10-CM | POA: Diagnosis not present

## 2020-03-12 DIAGNOSIS — Z888 Allergy status to other drugs, medicaments and biological substances status: Secondary | ICD-10-CM

## 2020-03-12 DIAGNOSIS — I4811 Longstanding persistent atrial fibrillation: Secondary | ICD-10-CM | POA: Diagnosis not present

## 2020-03-12 DIAGNOSIS — Z7289 Other problems related to lifestyle: Secondary | ICD-10-CM

## 2020-03-12 DIAGNOSIS — K909 Intestinal malabsorption, unspecified: Secondary | ICD-10-CM | POA: Diagnosis present

## 2020-03-12 DIAGNOSIS — E785 Hyperlipidemia, unspecified: Secondary | ICD-10-CM | POA: Diagnosis present

## 2020-03-12 DIAGNOSIS — I4891 Unspecified atrial fibrillation: Secondary | ICD-10-CM | POA: Diagnosis present

## 2020-03-12 DIAGNOSIS — E1165 Type 2 diabetes mellitus with hyperglycemia: Secondary | ICD-10-CM

## 2020-03-12 DIAGNOSIS — N178 Other acute kidney failure: Secondary | ICD-10-CM

## 2020-03-12 DIAGNOSIS — Z66 Do not resuscitate: Secondary | ICD-10-CM | POA: Diagnosis not present

## 2020-03-12 DIAGNOSIS — E1122 Type 2 diabetes mellitus with diabetic chronic kidney disease: Secondary | ICD-10-CM | POA: Diagnosis present

## 2020-03-12 DIAGNOSIS — N170 Acute kidney failure with tubular necrosis: Secondary | ICD-10-CM | POA: Diagnosis not present

## 2020-03-12 DIAGNOSIS — R451 Restlessness and agitation: Secondary | ICD-10-CM | POA: Diagnosis not present

## 2020-03-12 DIAGNOSIS — R29810 Facial weakness: Secondary | ICD-10-CM | POA: Diagnosis not present

## 2020-03-12 DIAGNOSIS — D631 Anemia in chronic kidney disease: Secondary | ICD-10-CM | POA: Diagnosis present

## 2020-03-12 DIAGNOSIS — K7460 Unspecified cirrhosis of liver: Secondary | ICD-10-CM | POA: Diagnosis present

## 2020-03-12 DIAGNOSIS — Z8674 Personal history of sudden cardiac arrest: Secondary | ICD-10-CM | POA: Diagnosis not present

## 2020-03-12 DIAGNOSIS — K219 Gastro-esophageal reflux disease without esophagitis: Secondary | ICD-10-CM | POA: Diagnosis present

## 2020-03-12 DIAGNOSIS — I249 Acute ischemic heart disease, unspecified: Secondary | ICD-10-CM

## 2020-03-12 DIAGNOSIS — Z951 Presence of aortocoronary bypass graft: Secondary | ICD-10-CM

## 2020-03-12 DIAGNOSIS — K0530 Chronic periodontitis, unspecified: Secondary | ICD-10-CM | POA: Diagnosis present

## 2020-03-12 DIAGNOSIS — G931 Anoxic brain damage, not elsewhere classified: Secondary | ICD-10-CM | POA: Diagnosis not present

## 2020-03-12 DIAGNOSIS — Z9689 Presence of other specified functional implants: Secondary | ICD-10-CM

## 2020-03-12 DIAGNOSIS — N1832 Chronic kidney disease, stage 3b: Secondary | ICD-10-CM | POA: Diagnosis not present

## 2020-03-12 DIAGNOSIS — I080 Rheumatic disorders of both mitral and aortic valves: Secondary | ICD-10-CM | POA: Diagnosis present

## 2020-03-12 DIAGNOSIS — Z452 Encounter for adjustment and management of vascular access device: Secondary | ICD-10-CM

## 2020-03-12 DIAGNOSIS — Z800 Family history of malignant neoplasm of digestive organs: Secondary | ICD-10-CM

## 2020-03-12 DIAGNOSIS — N400 Enlarged prostate without lower urinary tract symptoms: Secondary | ICD-10-CM | POA: Diagnosis present

## 2020-03-12 HISTORY — PX: RIGHT/LEFT HEART CATH AND CORONARY ANGIOGRAPHY: CATH118266

## 2020-03-12 LAB — GLUCOSE, CAPILLARY
Glucose-Capillary: 166 mg/dL — ABNORMAL HIGH (ref 70–99)
Glucose-Capillary: 151 mg/dL — ABNORMAL HIGH (ref 70–99)
Glucose-Capillary: 276 mg/dL — ABNORMAL HIGH (ref 70–99)

## 2020-03-12 LAB — HEMOGLOBIN A1C
Hgb A1c MFr Bld: 11.1 % — ABNORMAL HIGH (ref 4.8–5.6)
Mean Plasma Glucose: 271.87 mg/dL

## 2020-03-12 LAB — POCT I-STAT 7, (LYTES, BLD GAS, ICA,H+H)
Acid-Base Excess: 1 mmol/L (ref 0.0–2.0)
Bicarbonate: 25 mmol/L (ref 20.0–28.0)
Calcium, Ion: 1.22 mmol/L (ref 1.15–1.40)
HCT: 36 % — ABNORMAL LOW (ref 39.0–52.0)
Hemoglobin: 12.2 g/dL — ABNORMAL LOW (ref 13.0–17.0)
O2 Saturation: 97 %
Potassium: 3.3 mmol/L — ABNORMAL LOW (ref 3.5–5.1)
Sodium: 140 mmol/L (ref 135–145)
TCO2: 26 mmol/L (ref 22–32)
pCO2 arterial: 38.8 mmHg (ref 32.0–48.0)
pH, Arterial: 7.417 (ref 7.350–7.450)
pO2, Arterial: 85 mmHg (ref 83.0–108.0)

## 2020-03-12 LAB — POCT I-STAT EG7
Bicarbonate: 25.9 mmol/L (ref 20.0–28.0)
Calcium, Ion: 1.24 mmol/L (ref 1.15–1.40)
HCT: 37 % — ABNORMAL LOW (ref 39.0–52.0)
Hemoglobin: 12.6 g/dL — ABNORMAL LOW (ref 13.0–17.0)
O2 Saturation: 54 %
Potassium: 3.3 mmol/L — ABNORMAL LOW (ref 3.5–5.1)
Sodium: 140 mmol/L (ref 135–145)
TCO2: 27 mmol/L (ref 22–32)
pCO2, Ven: 48.7 mmHg (ref 44.0–60.0)
pH, Ven: 7.335 (ref 7.250–7.430)
pO2, Ven: 31 mmHg — CL (ref 32.0–45.0)

## 2020-03-12 LAB — POCT ACTIVATED CLOTTING TIME: Activated Clotting Time: 180 seconds

## 2020-03-12 SURGERY — RIGHT/LEFT HEART CATH AND CORONARY ANGIOGRAPHY
Anesthesia: LOCAL

## 2020-03-12 MED ORDER — SODIUM CHLORIDE 0.9 % IV SOLN: 250.0000 mL | INTRAVENOUS | Status: DC | PRN

## 2020-03-12 MED ORDER — METOPROLOL SUCCINATE ER 50 MG PO TB24
100.0000 mg | ORAL_TABLET | Freq: Every day | ORAL | Status: DC
  Administered 2020-03-13 – 2020-03-15 (×3): 100 mg via ORAL
  Filled 2020-03-12 (×3): qty 4

## 2020-03-12 MED ORDER — ASPIRIN 81 MG PO CHEW
81.0000 mg | CHEWABLE_TABLET | Freq: Every day | ORAL | Status: DC
  Administered 2020-03-13 – 2020-03-19 (×7): 81 mg via ORAL
  Filled 2020-03-12 (×7): qty 1

## 2020-03-12 MED ORDER — HEPARIN SODIUM (PORCINE) 1000 UNIT/ML IJ SOLN
INTRAMUSCULAR | Status: DC | PRN
  Administered 2020-03-12: 5500 [IU] via INTRAVENOUS

## 2020-03-12 MED ORDER — AMIODARONE HCL 200 MG PO TABS
200.0000 mg | ORAL_TABLET | Freq: Every day | ORAL | Status: DC
  Administered 2020-03-13 – 2020-03-19 (×7): 200 mg via ORAL
  Filled 2020-03-12 (×7): qty 1

## 2020-03-12 MED ORDER — HEPARIN (PORCINE) IN NACL 1000-0.9 UT/500ML-% IV SOLN
INTRAVENOUS | Status: DC | PRN
  Administered 2020-03-12 (×2): 500 mL

## 2020-03-12 MED ORDER — LIDOCAINE HCL (PF) 1 % IJ SOLN
INTRAMUSCULAR | Status: AC
  Filled 2020-03-12: qty 30

## 2020-03-12 MED ORDER — MIDAZOLAM HCL 2 MG/2ML IJ SOLN
INTRAMUSCULAR | Status: DC | PRN
  Administered 2020-03-12: 2 mg via INTRAVENOUS

## 2020-03-12 MED ORDER — SODIUM CHLORIDE 0.9% FLUSH
3.0000 mL | Freq: Two times a day (BID) | INTRAVENOUS | Status: DC
  Administered 2020-03-12 – 2020-03-15 (×4): 3 mL via INTRAVENOUS

## 2020-03-12 MED ORDER — SODIUM CHLORIDE 0.9% FLUSH: 3.0000 mL | INTRAVENOUS | Status: DC | PRN

## 2020-03-12 MED ORDER — ONDANSETRON HCL 4 MG/2ML IJ SOLN
4.0000 mg | Freq: Four times a day (QID) | INTRAMUSCULAR | Status: DC | PRN
  Administered 2020-03-12 – 2020-03-22 (×4): 4 mg via INTRAVENOUS
  Filled 2020-03-12 (×9): qty 2

## 2020-03-12 MED ORDER — ACETAMINOPHEN 325 MG PO TABS: 650.0000 mg | ORAL_TABLET | ORAL | Status: DC | PRN

## 2020-03-12 MED ORDER — CITALOPRAM HYDROBROMIDE 20 MG PO TABS
20.0000 mg | ORAL_TABLET | Freq: Every day | ORAL | Status: DC
  Administered 2020-03-12 – 2020-03-24 (×12): 20 mg via ORAL
  Filled 2020-03-12 (×12): qty 1

## 2020-03-12 MED ORDER — PANTOPRAZOLE SODIUM 40 MG PO TBEC
40.0000 mg | DELAYED_RELEASE_TABLET | Freq: Every day | ORAL | Status: DC
  Administered 2020-03-13 – 2020-03-19 (×7): 40 mg via ORAL
  Filled 2020-03-12 (×7): qty 1

## 2020-03-12 MED ORDER — SODIUM CHLORIDE 0.9 % WEIGHT BASED INFUSION
3.0000 mL/kg/h | INTRAVENOUS | Status: DC
  Administered 2020-03-12: 3 mL/kg/h via INTRAVENOUS

## 2020-03-12 MED ORDER — FENTANYL CITRATE (PF) 100 MCG/2ML IJ SOLN
INTRAMUSCULAR | Status: AC
  Filled 2020-03-12: qty 2

## 2020-03-12 MED ORDER — SODIUM CHLORIDE 0.9 % WEIGHT BASED INFUSION: 1.0000 mL/kg/h | INTRAVENOUS | Status: DC

## 2020-03-12 MED ORDER — MIDAZOLAM HCL 2 MG/2ML IJ SOLN
INTRAMUSCULAR | Status: AC
  Filled 2020-03-12: qty 2

## 2020-03-12 MED ORDER — ALPRAZOLAM 0.25 MG PO TABS: 1.0000 mg | ORAL_TABLET | Freq: Two times a day (BID) | ORAL | Status: DC | PRN

## 2020-03-12 MED ORDER — LIDOCAINE HCL (PF) 1 % IJ SOLN
INTRAMUSCULAR | Status: DC | PRN
  Administered 2020-03-12: 2 mL
  Administered 2020-03-12: 15 mL
  Administered 2020-03-12: 2 mL

## 2020-03-12 MED ORDER — VERAPAMIL HCL 2.5 MG/ML IV SOLN
INTRAVENOUS | Status: DC | PRN
  Administered 2020-03-12: 10 mL via INTRA_ARTERIAL

## 2020-03-12 MED ORDER — IOHEXOL 350 MG/ML SOLN
INTRAVENOUS | Status: DC | PRN
  Administered 2020-03-12: 80 mL

## 2020-03-12 MED ORDER — ALPRAZOLAM 0.25 MG PO TABS: 0.2500 mg | ORAL_TABLET | Freq: Three times a day (TID) | ORAL | Status: DC | PRN

## 2020-03-12 MED ORDER — HYDRALAZINE HCL 20 MG/ML IJ SOLN: 10.0000 mg | INTRAMUSCULAR | Status: AC | PRN

## 2020-03-12 MED ORDER — NITROGLYCERIN 0.4 MG SL SUBL: 0.4000 mg | SUBLINGUAL_TABLET | SUBLINGUAL | Status: DC | PRN

## 2020-03-12 MED ORDER — SODIUM CHLORIDE 0.9 % IV SOLN: INTRAVENOUS | Status: AC

## 2020-03-12 MED ORDER — TAMSULOSIN HCL 0.4 MG PO CAPS
0.4000 mg | ORAL_CAPSULE | Freq: Every day | ORAL | Status: DC
  Administered 2020-03-13 – 2020-03-21 (×8): 0.4 mg via ORAL
  Filled 2020-03-12 (×8): qty 1

## 2020-03-12 MED ORDER — INSULIN ASPART 100 UNIT/ML ~~LOC~~ SOLN
0.0000 [IU] | Freq: Three times a day (TID) | SUBCUTANEOUS | Status: DC
  Administered 2020-03-12 – 2020-03-13 (×2): 3 [IU] via SUBCUTANEOUS

## 2020-03-12 MED ORDER — SODIUM CHLORIDE 0.9% FLUSH: 3.0000 mL | Freq: Two times a day (BID) | INTRAVENOUS | Status: DC

## 2020-03-12 MED ORDER — FUROSEMIDE 10 MG/ML IJ SOLN
40.0000 mg | Freq: Two times a day (BID) | INTRAMUSCULAR | Status: DC
  Administered 2020-03-12 – 2020-03-14 (×4): 40 mg via INTRAVENOUS
  Filled 2020-03-12 (×4): qty 4

## 2020-03-12 MED ORDER — ATORVASTATIN CALCIUM 40 MG PO TABS
40.0000 mg | ORAL_TABLET | Freq: Every day | ORAL | Status: DC
  Administered 2020-03-12: 40 mg via ORAL
  Filled 2020-03-12: qty 1

## 2020-03-12 MED ORDER — INFLUENZA VAC A&B SA ADJ QUAD 0.5 ML IM PRSY
0.5000 mL | PREFILLED_SYRINGE | INTRAMUSCULAR | Status: DC
  Filled 2020-03-12: qty 0.5

## 2020-03-12 MED ORDER — HEPARIN (PORCINE) IN NACL 1000-0.9 UT/500ML-% IV SOLN
INTRAVENOUS | Status: AC
  Filled 2020-03-12: qty 1000

## 2020-03-12 MED ORDER — VERAPAMIL HCL 2.5 MG/ML IV SOLN
INTRAVENOUS | Status: AC
  Filled 2020-03-12: qty 2

## 2020-03-12 MED ORDER — HEPARIN SODIUM (PORCINE) 1000 UNIT/ML IJ SOLN
INTRAMUSCULAR | Status: AC
  Filled 2020-03-12: qty 1

## 2020-03-12 MED ORDER — LABETALOL HCL 5 MG/ML IV SOLN: 10.0000 mg | INTRAVENOUS | Status: AC | PRN

## 2020-03-12 MED ORDER — ASPIRIN 81 MG PO CHEW: 81.0000 mg | CHEWABLE_TABLET | ORAL | Status: DC

## 2020-03-12 MED ORDER — HEPARIN (PORCINE) 25000 UT/250ML-% IV SOLN
1200.0000 [IU]/h | INTRAVENOUS | Status: DC
  Filled 2020-03-12: qty 250

## 2020-03-12 MED ORDER — FENTANYL CITRATE (PF) 100 MCG/2ML IJ SOLN
INTRAMUSCULAR | Status: DC | PRN
  Administered 2020-03-12: 25 ug via INTRAVENOUS

## 2020-03-12 SURGICAL SUPPLY — 16 items
CATH 5FR JL3.5 JR4 ANG PIG MP (CATHETERS) ×2 IMPLANT
CATH BALLN WEDGE 5F 110CM (CATHETERS) ×2 IMPLANT
CATH SWAN GANZ 7F STRAIGHT (CATHETERS) ×2 IMPLANT
DEVICE RAD COMP TR BAND LRG (VASCULAR PRODUCTS) ×2 IMPLANT
GLIDESHEATH SLEND SS 6F .021 (SHEATH) ×2 IMPLANT
GUIDEWIRE .025 260CM (WIRE) ×2 IMPLANT
GUIDEWIRE INQWIRE 1.5J.035X260 (WIRE) ×1 IMPLANT
INQWIRE 1.5J .035X260CM (WIRE) ×2
KIT HEART LEFT (KITS) ×2 IMPLANT
PACK CARDIAC CATHETERIZATION (CUSTOM PROCEDURE TRAY) ×2 IMPLANT
SHEATH GLIDE SLENDER 4/5FR (SHEATH) ×2 IMPLANT
SHEATH PINNACLE 7F 10CM (SHEATH) ×2 IMPLANT
SHEATH PROBE COVER 6X72 (BAG) ×2 IMPLANT
TRANSDUCER W/STOPCOCK (MISCELLANEOUS) ×2 IMPLANT
TUBING CIL FLEX 10 FLL-RA (TUBING) ×2 IMPLANT
WIRE EMERALD 3MM-J .025X260CM (WIRE) ×2 IMPLANT

## 2020-03-12 NOTE — Consult Note (Signed)
[address]       [place], [address]             [phone]        [name] [name] [place] Medical Record #[id number] Date of Birth: 1951-02-06  Referring: [name] Blanks, MD Primary Care: [name] Poli, PA  Primary Cardiologist:[name] Martinique, MD  Chief Complaint:   Chest pressure, fatigue, and shortness of breath.   History of Present Illness:     [name] is a 69 year old male with multiple medical problems including poorly controlled type 2 diabetes mellitus, hypertension, dyslipidemia, chronic kidney disease, and bipolar disorder. He recovered from [place] infection in June of 2020.  He has an extensive and complex past cardiac history.  He has chronic atrial fibrillation managed with metoprolol, amiodarone, and Eliquis (last dose 03/11/2020).  He is status post mitral valve annuloplasty at [facility] in 2004.  He later had placement of an ICD in 2007 for ischemic cardiomyopathy and chronic systolic heart failure with poor EF.  This device was changed in 2014.  He denies having any shock therapy ever delivered except for while the device was being interrogated in his doctor's office.  He is also is known to have advanced coronary disease and is status post PTCI to the LAD, diagonal, and obtuse marginal coronary arteries.  He reports admission to the hospital earlier this year for management of rapid atrial fibrillation.  He apparently has some adjustments made to his medications and felt pretty good until about 3 weeks ago when he began experiencing worsening fatigue and shortness of breath.  He has been sleeping on 2 pillows quite recently.  He has been able to walk only short distances of less than 20 feet without severe fatigue.  He presented to [facility] yesterday with a primary complaint of chest pressure with associated fatigue and shortness of breath with minimal activity. His troponin was 0.06  He was transferred to [facility] for further evaluation.  Left heart catheterization was conducted earlier today and demonstrates severe three-vessel coronary artery disease.  Left anterior descending coronary artery has a partially restenosed stent.  The first diagonal has a patent stent with moderate stenosis beyond the stent.  The stent to the obtuse marginal is occluded.  There are left-to-right collaterals.  The right coronary artery is a large dominant vessel.  There are severe stenoses in the PDA and PL branches of the right coronary artery.  Cardiac index was measured at 1.7.  His ejection fraction was estimated 25 to 30% on echo performed in January of this year.  The study also demonstrated severe dilation of the left atrium.  The repaired mitral valve demonstrated only mild mitral insufficiency at that time.  We have been asked to evaluate [name] for recommendations regarding coronary revascularization for his advanced symptomatic coronary artery disease.   Current Activity/ Functional Status:    Zubrod Score: At the time of surgery this patient's most appropriate activity status/level should be described as: []     0    Normal activity, no symptoms [x]     1    Restricted in physical strenuous activity but ambulatory, able to do out light work []     2    Ambulatory and capable of self care, unable to do work activities, up and about                 more than 50%  Of the time                            []   3    Only limited self care, in bed greater than 50% of waking hours []     4    Completely disabled, no self care, confined to bed or chair []     5    Moribund  Past Medical History:  Diagnosis Date  . Anxiety   . Arthritis   . CAD (coronary artery disease)    a.  1993 s/p MI - [employer];  b. s/p BMS to LAD '00;  c. PTCA 2nd diagonal 2010;  d. 02/18/12 Cath: moderate nonobs dzs - med rx;  e.  01/2015 Cath: LM nl, LAD 40-53m ISR, 70-17m/d, d1 90p (3.0x16 Synergy DES), D2 50-60, LCX nl,  OM1 50p, 11m (2.5x12 Synergy DES), RCA nl, EF 30-35%.  . Chronic combined systolic and diastolic CHF (congestive heart failure) ([place])    a. 12/2014 Echo: EF 30-35%, Gr2 DD, mod MR, sev dil LA.  . CKD (chronic kidney disease), stage III   . Depression   . ED (erectile dysfunction)   . GERD (gastroesophageal reflux disease)   . Hyperlipidemia   . Hypertension   . Ischemic cardiomyopathy    a. s/p St. [name] (Atlas) ICD implanted in [place] 2007;  b. 12/2014 Echo: Ef 30-35%.  . MVP (mitral valve prolapse)    a. s/p MV annuloplasty at [facility] 2004.  Marland Kitchen Persistent atrial fibrillation ([place])    a. noted on ICD interrogation '10 - not previously on [place] - CHA2DS2VASc = 5.  . Type II diabetes mellitus ([place])    uncontrolled    Past Surgical History:  Procedure Laterality Date  . CARDIAC DEFIBRILLATOR PLACEMENT  2007   implanted in [place], has a 7001 RV lead and a SJM Atlas ICD followed by Dr [name] [name]  . CARDIOVERSION N/A 09/29/2016   Procedure: CARDIOVERSION;  Surgeon: [name] Perla, MD;  Location: [facility] ENDOSCOPY;  Service: Cardiovascular;  Laterality: N/A;  . CARDIOVERSION N/A 12/03/2016   Procedure: CARDIOVERSION;  Surgeon: [name] Spark, MD;  Location: [place];  Service: Cardiovascular;  Laterality: N/A;  . IMPLANTABLE CARDIOVERTER DEFIBRILLATOR (ICD) GENERATOR CHANGE N/A 03/19/2013   SJM Fortify ST DR generator placed by Dr [name] [name], part of Analyze ST study  . LEFT AND RIGHT HEART CATHETERIZATION WITH CORONARY ANGIOGRAM N/A 01/14/2015   Procedure: LEFT AND RIGHT HEART CATHETERIZATION WITH CORONARY ANGIOGRAM;  Surgeon: [name] M Martinique, MD;  Location: [facility] CATH LAB;  Service: Cardiovascular;  Laterality: N/A;  . LEFT HEART CATHETERIZATION WITH CORONARY ANGIOGRAM N/A 02/17/2012   Procedure: LEFT HEART CATHETERIZATION WITH CORONARY ANGIOGRAM;  Surgeon: [name] Artist, MD;  Location: [facility] CATH LAB;  Service: Cardiovascular;  Laterality: N/A;  . MITRAL VALVE ANNULOPLASTY  2004   [name] [name]  02/17/2012  . TEE WITHOUT CARDIOVERSION N/A 09/29/2016   Procedure: TRANSESOPHAGEAL ECHOCARDIOGRAM (TEE);  Surgeon: [name] Perla, MD;  Location: [facility] ENDOSCOPY;  Service: Cardiovascular;  Laterality: N/A;  . TEE WITHOUT CARDIOVERSION N/A 12/03/2016   Procedure: TRANSESOPHAGEAL ECHOCARDIOGRAM (TEE);  Surgeon: [name] Spark, MD;  Location: [facility] ENDOSCOPY;  Service: Cardiovascular;  Laterality: N/A;    Social History   Tobacco Use  Smoking Status Never Smoker  Smokeless Tobacco Never Used    Social History   Substance and Sexual Activity  Alcohol Use Yes  . Alcohol/week: 0.0 standard drinks   Comment: 01/14/2015 "used to drink alot; stopped completely in 1983"     Allergies  Allergen Reactions  . Liraglutide Nausea And Vomiting  . Lisinopril Cough    Current Facility-Administered Medications  Medication Dose Route Frequency Provider Last Rate Last Admin  . 0.9 %  sodium chloride infusion   Intravenous Continuous [name] Blanks, MD 75 mL/hr at 02/24/2020 1511 75 mL/hr at 02/17/2020 1511  . ALPRAZolam Duanne Moron) tablet 1 mg  1 mg Oral BID PRN [name] Blanks, MD      . Derrill Memo ON 03/13/2020] amiodarone (PACERONE) tablet 200 mg  200 mg Oral Daily [name] Blanks, MD      . atorvastatin (LIPITOR) tablet 40 mg  40 mg Oral q1800 [name] Blanks, MD      . citalopram (CELEXA) tablet 20 mg  20 mg Oral Daily [name] Blanks, MD      . furosemide (LASIX) injection 40 mg  40 mg Intravenous BID [name] Blanks, MD      . hydrALAZINE (APRESOLINE) injection 10 mg  10 mg Intravenous Q20 Min PRN [name] Blanks, MD      . insulin aspart (novoLOG) injection 0-15 Units  0-15 Units Subcutaneous TID WC [name] Blanks, MD      . labetalol (NORMODYNE) injection 10 mg  10 mg Intravenous Q10 min PRN [name] Blanks, MD      . Derrill Memo ON 03/13/2020] metoprolol succinate (TOPROL-XL) 24 hr tablet 100 mg  100 mg Oral Daily [name] Blanks,  MD      . nitroGLYCERIN (NITROSTAT) SL tablet 0.4 mg  0.4 mg Sublingual Q5 Min x 3 PRN [name] Blanks, MD      . ondansetron [facility]) injection 4 mg  4 mg Intravenous Q6H PRN [name] Blanks, MD      . Derrill Memo ON 03/13/2020] pantoprazole (PROTONIX) EC tablet 40 mg  40 mg Oral Daily [name] Blanks, MD      . Derrill Memo ON 03/13/2020] tamsulosin (FLOMAX) capsule 0.4 mg  0.4 mg Oral Daily [name] Blanks, MD        Medications Prior to Admission  Medication Sig Dispense Refill Last Dose  . ALPRAZolam (XANAX) 0.25 MG tablet      . ALPRAZolam (XANAX) 1 MG tablet Take 1 tablet (1 mg total) by mouth 2 (two) times daily as needed for anxiety. 10 tablet 0   . amiodarone (PACERONE) 200 MG tablet      . apixaban (ELIQUIS) 5 MG TABS tablet Take 1 tablet (5 mg total) by mouth 2 (two) times daily. 60 tablet 1   . atorvastatin (LIPITOR) 40 MG tablet Take 1 tablet (40 mg total) by mouth daily at 6 PM. 30 tablet 0   . citalopram (CELEXA) 20 MG tablet      . furosemide (LASIX) 40 MG tablet Take by mouth.     . insulin aspart (NOVOLOG FLEXPEN) 100 UNIT/ML FlexPen Inject 6 Units into the skin 3 (three) times daily with meals. 15 mL 0   . Insulin Glargine (BASAGLAR KWIKPEN) 100 UNIT/ML SOPN Inject 0.5 mLs (50 Units total) into the skin daily. 15 mL 1   . metoprolol succinate (TOPROL-XL) 100 MG 24 hr tablet Take 1 tablet (100 mg total) by mouth daily. 90 tablet 3   . nitroGLYCERIN (NITROSTAT) 0.4 MG SL tablet Place 1 tablet (0.4 mg total) under the tongue every 5 (five) minutes x 3 doses as needed for chest pain. 10 tablet 0   . pantoprazole (PROTONIX) 40 MG tablet Take 1 tablet (40 mg total) by mouth daily. 30 tablet 1   . potassium chloride SA (KLOR-CON) 20 MEQ tablet Take by mouth.     Marland Kitchen  tamsulosin (FLOMAX) 0.4 MG CAPS capsule Take 1 capsule (0.4 mg total) by mouth daily. 30 capsule 0     Family History  Problem Relation Age of Onset  . Coronary artery disease Mother   . Colon  cancer Mother   . Heart attack Mother   . Heart disease Mother   . Heart failure Mother   . Hypertension Mother   . Malignant hyperthermia Mother   . Colon cancer Sister   . Coronary artery disease Brother   . Heart disease Brother      Review of Systems:   ROS    Cardiac Review of Systems: Y or  [    ]= no  Chest Pain [  y  ]  Resting SOB [   ] Exertional SOB  [ y ]  Vertell Limber Blue.Reese  ]   Pedal Edema [   ]    Palpitations [  ] Syncope  [  ]   Presyncope [   ]  General Review of Systems: [Y] = yes [  ]=no Constitional: recent weight change [ y ]; anorexia [  ]; fatigue Blue.Reese  ]; nausea [  ]; night sweats [  ]; fever [  ]; or chills [  ]                                                               Dental: Last Dentist visit:   Eye : blurred vision [  ]; diplopia [   ]; vision changes [  ];  Amaurosis fugax[  ]; Resp: cough [  ];  wheezing[  ];  hemoptysis[  ]; shortness of breath[ y ]; paroxysmal nocturnal dyspnea[  ]; dyspnea on exertion[  ]; or orthopnea[  ];  GI:  gallstones[  ], vomiting[  ];  dysphagia[  ]; melena[  ];  hematochezia [  ]; heartburn[  ];   Hx of  Colonoscopy[  ]; GU: kidney stones [  ]; hematuria[  ];   dysuria [  ];  nocturia[  ];  history of     obstruction [  ]; urinary frequency [  ]             Skin: rash, swelling[  ];, hair loss[  ];  peripheral edema[  ];  or itching[  ]; Musculosketetal: myalgias[  ];  joint swelling[  ];  joint erythema[  ];  joint pain[  ];  back pain[  ];  Heme/Lymph: bruising[  ];  bleeding[  ];  anemia[  ];  Neuro: TIA[  ];  headaches[  ];  stroke[  ];  vertigo[  ];  seizures[  ];   paresthesias[  ];  difficulty walking[  ];  Psych:depression[  ]; anxiety[y  ];  Endocrine: diabetes[ y ];  thyroid dysfunction[  ];                        Physical Exam: BP (!) 161/108   Pulse 78   Resp 17   SpO2 100%    General appearance: alert, cooperative, appears stated age and no distress but is anxious. Head: Normocephalic, without obvious  abnormality, atraumatic Neck: no adenopathy, no carotid bruit, no JVD, supple, symmetrical, trachea midline and thyroid not enlarged, symmetric,  no tenderness/mass/nodules Lymph nodes: Cervical, supraclavicular nodes normal. Resp: Breath sounds are clear anterior Cardio: irregularly irregular rhythm. 2/6 systolic murmur. GI: soft, mildly tender; bowel sounds normal; no masses,  no organomegaly Extremities: All are well perfused.  He has a TR band over the right radial artery.  Left radial pulses easily palpable.  I am not able to palpate dorsalis pedis or posterior tibial pulses in either foot.  He has no peripheral edema and no obvious varicosities. Neurologic: Grossly normal  Diagnostic Studies & Laboratory data:  RIGHT/LEFT HEART CATH AND CORONARY ANGIOGRAPHY  Conclusion    Dist RCA lesion is 50% stenosed.  3rd RPL lesion is 100% stenosed.  RPDA lesion is 99% stenosed.  Prox RCA lesion is 20% stenosed.  RPAV lesion is 60% stenosed.  3rd Mrg-2 lesion is 100% stenosed.  3rd Mrg-1 lesion is 100% stenosed.  Ost Cx to Mid Cx lesion is 40% stenosed.  1st Diag-1 lesion is 10% stenosed.  1st Diag-2 lesion is 90% stenosed.  2nd Diag lesion is 99% stenosed.  Prox LAD to Mid LAD lesion is 70% stenosed.  Mid LAD to Dist LAD lesion is 60% stenosed.   1. Severe triple vessel CAD 2. The LAD wraps the apex. The LAD has a patent mid stented segment with moderate restenosis. The remainder of the mid and distal LAD has diffuse moderate to severe stenosis. There are two moderate caliber diagonal branches that arise from the mid LAD. The first diagonal branch has a patent proximal stent and then severe mid stenosis. The second diagonal branch has diffuse severe stenosis.  3. The Circumflex has moderate proximal stenosis. The large obtuse marginal that has been stented is now occluded and fills from left to right collaterals.  4. The RCA is a large dominant vessel with a large PDA and  moderate caliber posterolateral artery. The PDA has a severe ostial stenosis. The posterolateral artery has severe proximal stenosis and total mid occlusion.  5. Elevated wedge pressure.  6. Cardiac output 3.88, CI 1.71   Recommendations: He will be admitted to telemetry. We will need to get more information prior to planning for revascularization. We will need to optimize his fluid status. I will arrange an echo today to evaluate his valves and LVEF. He is volume overloaded and will be started on IV diuretics. I will ask CT surgery to see him to review CABG as a revascularization option. He has diffuse multi-vessel CAD with sub-optimal targets. His vessels are not favorable for PCI. The PDA would be a target for stenting.  Continue statin and beta blocker. Will start ASA. His oral anticoagulation is on hold. Will resume IV heparin 6 hours post sheath removal.    Recommendations  Antiplatelet/Anticoag He will be admitted to telemetry. We will need to get more information prior to planning for revascularization as well as optimization of his fluid status. I will arrange an echo today. He is volume overloaded and will be started on IV diuretics. I will ask CT surgery to see him to review CABG as a revascularization option. He has diffuse multi-vessel CAD with sub-optimal targets. His vessels are not favorable for PCI. Continue statin and beta blocker. Will start ASA. His oral anticoagulation is on hold. Will resume IV heparin 6 hours post sheath removal.  Indications  Unstable angina (HCC) [I20.0 (ICD-10-CM)]  Procedural Details  Technical Details Indication: Known Cardiomyopathy with ICD in place, prior mitral valve repair in 2004, CAD with prior stenting of the LAD, OM and Diagonal  admitted with progressive dyspnea and chest pressure at [facility]. Troponin mildly elevated (0.06). Pt transferred to [facility] for cardiac cath.   Procedure: The risks, benefits, complications, treatment options, and  expected outcomes were discussed with the patient. The patient and/or family concurred with the proposed plan, giving informed consent. The patient was brought to the cath lab after IV hydration was given. The patient was sedated with Versed and Fentanyl. The right wrist was prepped and draped in a sterile fashion. 1% lidocaine was used for local anesthesia. Using the modified Seldinger access technique, a 5 French sheath was placed in the right radial artery. 3 mg Verapamil was given through the sheath. Weight based IV heparin was given. Standard diagnostic catheters were used to perform selective coronary angiography. A pigtail catheter was used to cross the aortic valve and measure LV pressures. At this time, we decided to add on a right heart catheterization. The IV catheter present in the right antecubital vein was changed for a 5 [place] sheath. I was unable to pass a wire. The right groin was prepped and draped. Using u/s guidance, a 7 French sheath was placed in the right femoral vein. A right heart catheterization was performed with a balloon tipped catheter. The sheath was removed from the right radial artery and a Terumo hemostasis band was applied at the arteriotomy site on the right wrist. The sheath was removed from the right femoral vein and manual pressure was used for hemostasis.    Estimated blood loss <50 mL.   During this procedure medications were administered to achieve and maintain moderate conscious sedation while the patient's heart rate, blood pressure, and oxygen saturation were continuously monitored and I was present face-to-face 100% of this time.  Medications (Filter: Administrations occurring from 02/19/2020 1326 to 03/02/2020 1457) (important)  Continuous medications are totaled by the amount administered until 03/09/2020 1457.  lidocaine (PF) (XYLOCAINE) 1 % injection (mL) Total volume:  19 mL  Date/Time  Rate/Dose/Volume Action  02/27/2020 1355  2 mL Given  1418  2 mL Given   1423  15 mL Given    Radial Cocktail/Verapamil only (mL) Total volume:  10 mL  Date/Time  Rate/Dose/Volume Action  03/08/2020 1356  10 mL Given    heparin injection (Units) Total dose:  5,500 Units  Date/Time  Rate/Dose/Volume Action  03/06/2020 1357  5,500 Units Given    fentaNYL (SUBLIMAZE) injection (mcg) Total dose:  25 mcg  Date/Time  Rate/Dose/Volume Action  02/11/2020 1353  25 mcg Given    midazolam (VERSED) injection (mg) Total dose:  2 mg  Date/Time  Rate/Dose/Volume Action  02/26/2020 1353  2 mg Given    Heparin (Porcine) in NaCl 1000-0.9 UT/500ML-% SOLN (mL) Total volume:  1,000 mL  Date/Time  Rate/Dose/Volume Action  02/12/2020 1404  500 mL Given  1405  500 mL Given    iohexol (OMNIPAQUE) 350 MG/ML injection (mL) Total volume:  80 mL  Date/Time  Rate/Dose/Volume Action  02/19/2020 1441  80 mL Given    Sedation Time  Sedation Time Physician-1: 46 minutes 38 seconds  Contrast  Medication Name Total Dose  iohexol (OMNIPAQUE) 350 MG/ML injection 80 mL    Radiation/Fluoro  Fluoro time: 9.8 (min) DAP: 41.6 (Gycm2) Cumulative Air Kerma: 578.4 (mGy)  Complications  RIGHT/LEFT HEART CATH AND CORONARY ANGIOGRAPHY  None Documented by [name] Blanks, MD 03/11/2020 3:04 PM  Date Found: 02/24/2020  Time Range: Intraprocedure      Coronary Findings  Diagnostic Dominance: Right Left Anterior  Descending  Vessel is large.  Prox LAD to Mid LAD lesion 70% stenosed  Prox LAD to Mid LAD lesion is 70% stenosed. The lesion was previously treated using a stent (unknown type) over 2 years ago.  Mid LAD to Dist LAD lesion 60% stenosed  Mid LAD to Dist LAD lesion is 60% stenosed.  First Diagonal Branch  Vessel is moderate in size.  1st Diag-1 lesion 10% stenosed  1st Diag-1 lesion is 10% stenosed. The lesion was previously treated using a stent (unknown type) over 2 years ago.  1st Diag-2 lesion 90% stenosed  1st Diag-2 lesion is 90% stenosed.  Second  Diagonal Branch  Vessel is moderate in size.  2nd Diag lesion 99% stenosed  2nd Diag lesion is 99% stenosed.  Left Circumflex  Vessel is large.  Ost Cx to Mid Cx lesion 40% stenosed  Ost Cx to Mid Cx lesion is 40% stenosed.  Third Obtuse Marginal Branch  Collaterals  3rd Mrg filled by collaterals from 2nd Sept.    3rd Mrg-1 lesion 100% stenosed  3rd Mrg-1 lesion is 100% stenosed. The lesion is chronically occluded. The lesion was previously treated.  3rd Mrg-2 lesion 100% stenosed  3rd Mrg-2 lesion is 100% stenosed. The lesion was previously treated using a stent (unknown type) over 2 years ago.  Right Coronary Artery  Vessel is large.  Prox RCA lesion 20% stenosed  Prox RCA lesion is 20% stenosed.  Dist RCA lesion 50% stenosed  Dist RCA lesion is 50% stenosed.  Right Posterior Descending Artery  Vessel is moderate in size.  RPDA lesion 99% stenosed  RPDA lesion is 99% stenosed.  Right Posterior Atrioventricular Artery  RPAV lesion 60% stenosed  RPAV lesion is 60% stenosed.  Third Right Posterolateral Branch  Vessel is moderate in size.  3rd RPL lesion 100% stenosed  3rd RPL lesion is 100% stenosed. The lesion is chronically occluded.  Intervention  No interventions have been documented. Coronary Diagrams  Diagnostic Dominance: Right         Recent Radiology Findings:     I have independently reviewed the above radiologic studies and discussed with the patient   Recent Lab Findings: Lab Results  Component Value Date   WBC 5.5 01/08/2020   HGB 12.6 (L) 03/08/2020   HCT 37.0 (L) 02/24/2020   PLT 173 01/08/2020   GLUCOSE 512 (HH) 03/07/2020   CHOL 124 01/06/2020   TRIG 147 01/06/2020   HDL 32 (L) 01/06/2020   LDLCALC 63 01/06/2020   ALT 22 01/06/2020   AST 23 01/06/2020   NA 140 02/26/2020   K 3.3 (L) 02/12/2020   CL 95 (L) 03/07/2020   CREATININE 1.55 (H) 03/07/2020   BUN 16 03/07/2020   CO2 19 (L) 03/07/2020   TSH 8.100 (H) 02/15/2020   INR 1.3 (H)  01/07/2020   HGBA1C 11.0 (H) 01/06/2020      Assessment / Plan:    -Severe, symptomatic coronary artery disease in a 69 year old male with longstanding ischemic cardiomyopathy who is status post sternotomy and mitral valve repair in 2004.  Surgical coronary revascularization is his best option.  The LAD is relatively small and diffusely diseased vessel with questionable graftability.  The remaining coronary lesions appear amenable to grafting.  We will repeat the echocardiogram and also initiate standard preoperative work-up including carotid Dopplers and lower extremity duplex scan.  We will request preoperative evaluation by the advanced heart failure team to assist with optimization of his hemodynamics prior to planned surgery.   -  Type 2 diabetes mellitus with poor control.  His hemoglobin A1c's  over the past few years have been consistently greater than 10.  His recent BMP performed at the time of cardiology follow-up 4 days ago showed a serum glucose in excess of 500.  He admits to not taking his insulin as he should.  -Longstanding atrial fibrillation.  His ventricular rate is adequately controlled on his current regimen of metoprolol and amiodarone.  Left atrial clipping at the time of coronary bypass surgery would be prudent to reduce his future risks of thromboembolic events.     His last dose of Eliquis was yesterday morning.  We will consider reversal depending on the timing of surgery.  -CKD- Most recent creat was 1.55 on 03/07/20. Will follow trend post contrast exposure.   -Dyslipidemia- Statin continued on admission.  -History of bipolar disorder with depression- Celexa and Xanax resumed on admission.  I  spent 25 minutes counseling the patient face to face.   Antony Odea, PA-C 03/02/2020 4:23 PM

## 2020-03-12 NOTE — Progress Notes (Signed)
ANTICOAGULATION CONSULT NOTE - Initial Consult  Pharmacy Consult for heparin Indication: 3v disease; CABG eval   Allergies  Allergen Reactions  . Liraglutide Nausea And Vomiting  . Lisinopril Cough    Patient Measurements:   Heparin Dosing Weight: 97 kg  Vital Signs: BP: 163/143 (03/31 1605) Pulse Rate: 82 (03/31 1605)  Labs: Recent Labs    03/03/2020 1429 03/07/2020 1434  HGB 12.2* 12.6*  HCT 36.0* 37.0*    Estimated Creatinine Clearance: 55.3 mL/min (A) (by C-G formula based on SCr of 1.55 mg/dL (H)).   Medical History: Past Medical History:  Diagnosis Date  . Anxiety   . Arthritis   . CAD (coronary artery disease)    a.  1993 s/p MI - [employer];  b. s/p BMS to LAD '00;  c. PTCA 2nd diagonal 2010;  d. 02/18/12 Cath: moderate nonobs dzs - med rx;  e.  01/2015 Cath: LM nl, LAD 40-64m ISR, 70-60m/d, d1 90p (3.0x16 Synergy DES), D2 50-60, LCX nl, OM1 50p, 59m (2.5x12 Synergy DES), RCA nl, EF 30-35%.  . Chronic combined systolic and diastolic CHF (congestive heart failure) ([place])    a. 12/2014 Echo: EF 30-35%, Gr2 DD, mod MR, sev dil LA.  . CKD (chronic kidney disease), stage III   . Depression   . ED (erectile dysfunction)   . GERD (gastroesophageal reflux disease)   . Hyperlipidemia   . Hypertension   . Ischemic cardiomyopathy    a. s/p St. [name] (Atlas) ICD implanted in [place] 2007;  b. 12/2014 Echo: Ef 30-35%.  . MVP (mitral valve prolapse)    a. s/p MV annuloplasty at [facility] 2004.  Marland Kitchen Persistent atrial fibrillation ([place])    a. noted on ICD interrogation '10 - not previously on [place] - CHA2DS2VASc = 5.  . Type II diabetes mellitus ([place])    uncontrolled    Medications:  Medications Prior to Admission  Medication Sig Dispense Refill Last Dose  . ALPRAZolam (XANAX) 0.25 MG tablet      . ALPRAZolam (XANAX) 1 MG tablet Take 1 tablet (1 mg total) by mouth 2 (two) times daily as needed for anxiety. 10 tablet 0   . amiodarone (PACERONE) 200 MG tablet      .  apixaban (ELIQUIS) 5 MG TABS tablet Take 1 tablet (5 mg total) by mouth 2 (two) times daily. 60 tablet 1   . atorvastatin (LIPITOR) 40 MG tablet Take 1 tablet (40 mg total) by mouth daily at 6 PM. 30 tablet 0   . citalopram (CELEXA) 20 MG tablet      . furosemide (LASIX) 40 MG tablet Take by mouth.     . insulin aspart (NOVOLOG FLEXPEN) 100 UNIT/ML FlexPen Inject 6 Units into the skin 3 (three) times daily with meals. 15 mL 0   . Insulin Glargine (BASAGLAR KWIKPEN) 100 UNIT/ML SOPN Inject 0.5 mLs (50 Units total) into the skin daily. 15 mL 1   . metoprolol succinate (TOPROL-XL) 100 MG 24 hr tablet Take 1 tablet (100 mg total) by mouth daily. 90 tablet 3   . nitroGLYCERIN (NITROSTAT) 0.4 MG SL tablet Place 1 tablet (0.4 mg total) under the tongue every 5 (five) minutes x 3 doses as needed for chest pain. 10 tablet 0   . pantoprazole (PROTONIX) 40 MG tablet Take 1 tablet (40 mg total) by mouth daily. 30 tablet 1   . potassium chloride SA (KLOR-CON) 20 MEQ tablet Take by mouth.     . tamsulosin (FLOMAX) 0.4 MG  CAPS capsule Take 1 capsule (0.4 mg total) by mouth daily. 30 capsule 0     Assessment: 26 YOM s/p R & L heart cath found to have 3v disease now being evaluated for CABG. Pharmacy consulted to start IV heparin 6 hours post sheath removal. Hgb slightly low.   Goal of Therapy:  Heparin level 0.3-0.7 units/ml Monitor platelets by anticoagulation protocol: Yes   Plan:  -Start IV heparin at 1200 units/hr at 8:30 PM today -F/u 8hr HL -Monitor daily HL, CBC and s/s of bleeding   Albertina Parr, PharmD., BCPS Clinical Pharmacist Clinical phone for 03/01/2020 until 3:30pm: [phone] If after 3:30pm, please refer to [facility] for unit-specific pharmacist

## 2020-03-12 NOTE — Plan of Care (Signed)
Began education. [name] [name]

## 2020-03-12 NOTE — Progress Notes (Signed)
Patient arrived from [place] by EMS. Infusing is Heparin drip at 1000U/Hr to left lower forearm

## 2020-03-12 NOTE — Progress Notes (Signed)
Site area: Rt fem venous Site Prior to Removal:  Level 0 Pressure Applied For: 15 min Manual:  manual Patient Status During Pull:  A/O Post Pull Site:  Level 0 Post Pull Instructions Given:  Post instructions given and pt understands Post Pull Pulses Present: rt pt Dressing Applied: Tegaderm and a 4x4  Bedrest begins @ 15:30:00 Comments: Rt groin is unremarkable. Dressing is CDI.

## 2020-03-12 NOTE — H&P (Signed)
Brief Cardiology Note:   Please see the scanned note from [facility]. (Cardiology consultation by Dr. Agustin Cree)  69 yo male with history of CAD with prior stenting of the LAD, Diagonal and OM in an outside hospital, ischemic cardiomyopathy with ICD in place, chronic systolic CHF, MVP s/p mitral valve repair in 2004 at [facility], permanent atrial fib on chronic anticoagulation, HTN, HLD, DM, depression who was admitted to [facility] with c/o progressive weakness, fatigue, dyspnea and chest pressure. Troponin mildly elevated at 0.06. He was transferred to [facility] for cardiac cath. His cardiac catheterization shows severe three vessel CAD.   Cardiac cath 02/11/2020: 1. Severe triple vessel CAD 2. The LAD wraps the apex. The LAD has a patent mid stented segment with moderate restenosis. The remainder of the mid and distal LAD has diffuse moderate to severe stenosis. There are two moderate caliber diagonal branches that arise from the mid LAD. The first diagonal branch has a patent proximal stent and then severe mid stenosis. The second diagonal branch has diffuse severe stenosis.  3. The Circumflex has moderate proximal stenosis. The large obtuse marginal that has been stented is now occluded and fills from left to right collaterals.  4. The RCA is a large dominant vessel with a large PDA and moderate caliber posterolateral artery. The PDA has a severe ostial stenosis. The posterolateral artery has severe proximal stenosis and total mid occlusion.  5. Elevated wedge pressure.  6. Cardiac output 3.88, CI 1.71   Plan: He will be admitted to telemetry. We will need to get more information prior to planning for revascularization. We will need to optimize his fluid status. He is volume overloaded and will be started on IV diuretics. I will arrange an echo today to evaluate his valves and LVEF.  I will ask CT surgery to see him to review CABG as a revascularization option. He has diffuse  multi-vessel CAD with sub-optimal targets. His vessels are not favorable for PCI. The PDA could possibly be a target for stenting.  Continue statin and beta blocker. Will start ASA. His oral anticoagulation is on hold. Will resume IV heparin 6 hours post sheath removal.   Lauree Chandler 03/11/2020 3:14 PM

## 2020-03-12 NOTE — Interval H&P Note (Signed)
History and Physical Interval Note:  03/03/2020 1:44 PM  Please see cardiology consult note from 03/11/20 for details.   [name] [name]  has presented today for surgery, with the diagnosis of chest pain.  The various methods of treatment have been discussed with the patient and family. After consideration of risks, benefits and other options for treatment, the patient has consented to  Procedure(s): LEFT HEART CATH AND CORONARY ANGIOGRAPHY (N/A) as a surgical intervention.  The patient's history has been reviewed, patient examined, no change in status, stable for surgery.  I have reviewed the patient's chart and labs.  Questions were answered to the patient's satisfaction.    Cath Lab Visit (complete for each Cath Lab visit)  Clinical Evaluation Leading to the Procedure:   ACS: No.  Non-ACS:    Anginal Classification: CCS III  Anti-ischemic medical therapy: Minimal Therapy (1 class of medications)  Non-Invasive Test Results: No non-invasive testing performed  Prior CABG: No previous CABG         Lauree Chandler

## 2020-03-13 ENCOUNTER — Inpatient Hospital Stay (HOSPITAL_COMMUNITY): Payer: Medicare HMO

## 2020-03-13 ENCOUNTER — Encounter (HOSPITAL_COMMUNITY): Payer: Self-pay | Admitting: Certified Registered"

## 2020-03-13 DIAGNOSIS — E785 Hyperlipidemia, unspecified: Secondary | ICD-10-CM

## 2020-03-13 DIAGNOSIS — Z01810 Encounter for preprocedural cardiovascular examination: Secondary | ICD-10-CM | POA: Diagnosis not present

## 2020-03-13 DIAGNOSIS — I2510 Atherosclerotic heart disease of native coronary artery without angina pectoris: Secondary | ICD-10-CM | POA: Diagnosis not present

## 2020-03-13 DIAGNOSIS — I200 Unstable angina: Secondary | ICD-10-CM

## 2020-03-13 DIAGNOSIS — Z794 Long term (current) use of insulin: Secondary | ICD-10-CM

## 2020-03-13 DIAGNOSIS — I255 Ischemic cardiomyopathy: Secondary | ICD-10-CM

## 2020-03-13 DIAGNOSIS — I4892 Unspecified atrial flutter: Secondary | ICD-10-CM

## 2020-03-13 DIAGNOSIS — E118 Type 2 diabetes mellitus with unspecified complications: Secondary | ICD-10-CM

## 2020-03-13 LAB — BLOOD GAS, ARTERIAL
Acid-Base Excess: 1.3 mmol/L (ref 0.0–2.0)
Bicarbonate: 25.1 mmol/L (ref 20.0–28.0)
Drawn by: 780981
FIO2: 21
O2 Saturation: 97 %
Patient temperature: 37
pCO2 arterial: 37.7 mmHg (ref 32.0–48.0)
pH, Arterial: 7.439 (ref 7.350–7.450)
pO2, Arterial: 89.3 mmHg (ref 83.0–108.0)

## 2020-03-13 LAB — PULMONARY FUNCTION TEST
FEF 25-75 Pre: 2.05 L/sec
FEF2575-%Pred-Pre: 76 %
FEV1-%Pred-Pre: 72 %
FEV1-Pre: 2.54 L
FEV1FVC-%Pred-Pre: 103 %
FEV6-%Pred-Pre: 71 %
FEV6-Pre: 3.24 L
FEV6FVC-%Pred-Pre: 102 %
FVC-%Pred-Pre: 70 %
FVC-Pre: 3.35 L
Pre FEV1/FVC ratio: 76 %
Pre FEV6/FVC Ratio: 97 %

## 2020-03-13 LAB — ECHOCARDIOGRAM COMPLETE
Height: 72 in
Weight: 3918.9 oz

## 2020-03-13 LAB — CBC
HCT: 37.6 % — ABNORMAL LOW (ref 39.0–52.0)
Hemoglobin: 12.5 g/dL — ABNORMAL LOW (ref 13.0–17.0)
MCH: 30.6 pg (ref 26.0–34.0)
MCHC: 33.2 g/dL (ref 30.0–36.0)
MCV: 92.2 fL (ref 80.0–100.0)
Platelets: 165 10*3/uL (ref 150–400)
RBC: 4.08 MIL/uL — ABNORMAL LOW (ref 4.22–5.81)
RDW: 13.7 % (ref 11.5–15.5)
WBC: 5.1 10*3/uL (ref 4.0–10.5)
nRBC: 0 % (ref 0.0–0.2)

## 2020-03-13 LAB — BASIC METABOLIC PANEL
Anion gap: 10 (ref 5–15)
BUN: 25 mg/dL — ABNORMAL HIGH (ref 8–23)
CO2: 26 mmol/L (ref 22–32)
Calcium: 8.8 mg/dL — ABNORMAL LOW (ref 8.9–10.3)
Chloride: 103 mmol/L (ref 98–111)
Creatinine, Ser: 1.91 mg/dL — ABNORMAL HIGH (ref 0.61–1.24)
GFR calc Af Amer: 40 mL/min — ABNORMAL LOW (ref >60)
GFR calc non Af Amer: 35 mL/min — ABNORMAL LOW (ref >60)
Glucose, Bld: 188 mg/dL — ABNORMAL HIGH (ref 70–99)
Potassium: 3.7 mmol/L (ref 3.5–5.1)
Sodium: 139 mmol/L (ref 135–145)

## 2020-03-13 LAB — GLUCOSE, CAPILLARY
Glucose-Capillary: 164 mg/dL — ABNORMAL HIGH (ref 70–99)
Glucose-Capillary: 131 mg/dL — ABNORMAL HIGH (ref 70–99)
Glucose-Capillary: 87 mg/dL (ref 70–99)
Glucose-Capillary: 166 mg/dL — ABNORMAL HIGH (ref 70–99)
Glucose-Capillary: 157 mg/dL — ABNORMAL HIGH (ref 70–99)

## 2020-03-13 LAB — HEPARIN LEVEL (UNFRACTIONATED): Heparin Unfractionated: 1 IU/mL — ABNORMAL HIGH (ref 0.30–0.70)

## 2020-03-13 LAB — APTT: aPTT: 78 seconds — ABNORMAL HIGH (ref 24–36)

## 2020-03-13 LAB — ABO/RH: ABO/RH(D): O POS

## 2020-03-13 LAB — TYPE AND SCREEN
ABO/RH(D): O POS
Antibody Screen: NEGATIVE

## 2020-03-13 MED ORDER — NOREPINEPHRINE 4 MG/250ML-% IV SOLN
0.0000 ug/min | INTRAVENOUS | Status: DC
  Filled 2020-03-13: qty 250

## 2020-03-13 MED ORDER — MILRINONE LACTATE IN DEXTROSE 20-5 MG/100ML-% IV SOLN
0.3000 ug/kg/min | INTRAVENOUS | Status: DC
  Filled 2020-03-13: qty 100

## 2020-03-13 MED ORDER — TRANEXAMIC ACID (OHS) BOLUS VIA INFUSION
15.0000 mg/kg | INTRAVENOUS | Status: DC
  Filled 2020-03-13: qty 1667

## 2020-03-13 MED ORDER — TRANEXAMIC ACID 1000 MG/10ML IV SOLN
1.5000 mg/kg/h | INTRAVENOUS | Status: DC
  Filled 2020-03-13: qty 25

## 2020-03-13 MED ORDER — POTASSIUM CHLORIDE 2 MEQ/ML IV SOLN
80.0000 meq | INTRAVENOUS | Status: DC
  Filled 2020-03-13: qty 40

## 2020-03-13 MED ORDER — NITROGLYCERIN IN D5W 200-5 MCG/ML-% IV SOLN
2.0000 ug/min | INTRAVENOUS | Status: DC
  Filled 2020-03-13: qty 250

## 2020-03-13 MED ORDER — EPINEPHRINE HCL 5 MG/250ML IV SOLN IN NS
0.0000 ug/min | INTRAVENOUS | Status: DC
  Filled 2020-03-13: qty 250

## 2020-03-13 MED ORDER — TRANEXAMIC ACID (OHS) PUMP PRIME SOLUTION
2.0000 mg/kg | INTRAVENOUS | Status: DC
  Filled 2020-03-13: qty 2.22

## 2020-03-13 MED ORDER — INSULIN REGULAR(HUMAN) IN NACL 100-0.9 UT/100ML-% IV SOLN
INTRAVENOUS | Status: DC
  Filled 2020-03-13: qty 100

## 2020-03-13 MED ORDER — SODIUM CHLORIDE 0.9 % IV SOLN
INTRAVENOUS | Status: DC
  Filled 2020-03-13: qty 30

## 2020-03-13 MED ORDER — AMLODIPINE BESYLATE 5 MG PO TABS
5.0000 mg | ORAL_TABLET | Freq: Every day | ORAL | Status: DC
  Administered 2020-03-13 – 2020-03-15 (×3): 5 mg via ORAL
  Filled 2020-03-13 (×4): qty 1

## 2020-03-13 MED ORDER — MAGNESIUM SULFATE 50 % IJ SOLN
40.0000 meq | INTRAMUSCULAR | Status: DC
  Filled 2020-03-13: qty 9.85

## 2020-03-13 MED ORDER — ATORVASTATIN CALCIUM 80 MG PO TABS
80.0000 mg | ORAL_TABLET | Freq: Every day | ORAL | Status: DC
  Administered 2020-03-13 – 2020-03-24 (×12): 80 mg via ORAL
  Filled 2020-03-13 (×14): qty 1

## 2020-03-13 MED ORDER — PHENYLEPHRINE HCL-NACL 20-0.9 MG/250ML-% IV SOLN
30.0000 ug/min | INTRAVENOUS | Status: DC
  Filled 2020-03-13: qty 250

## 2020-03-13 MED ORDER — INSULIN ASPART 100 UNIT/ML ~~LOC~~ SOLN
0.0000 [IU] | Freq: Three times a day (TID) | SUBCUTANEOUS | Status: DC
  Administered 2020-03-13: 4 [IU] via SUBCUTANEOUS
  Administered 2020-03-13: 3 [IU] via SUBCUTANEOUS
  Administered 2020-03-14 (×2): 4 [IU] via SUBCUTANEOUS
  Administered 2020-03-14 – 2020-03-15 (×2): 3 [IU] via SUBCUTANEOUS
  Administered 2020-03-15 – 2020-03-16 (×4): 7 [IU] via SUBCUTANEOUS
  Administered 2020-03-16: 11 [IU] via SUBCUTANEOUS
  Administered 2020-03-17: 4 [IU] via SUBCUTANEOUS
  Administered 2020-03-17: 18:00:00 7 [IU] via SUBCUTANEOUS
  Administered 2020-03-17: 08:00:00 11 [IU] via SUBCUTANEOUS
  Administered 2020-03-18: 3 [IU] via SUBCUTANEOUS
  Administered 2020-03-18: 18:00:00 7 [IU] via SUBCUTANEOUS
  Administered 2020-03-19: 12:00:00 4 [IU] via SUBCUTANEOUS
  Administered 2020-03-19: 15 [IU] via SUBCUTANEOUS
  Administered 2020-03-19: 4 [IU] via SUBCUTANEOUS

## 2020-03-13 MED ORDER — VANCOMYCIN HCL 1500 MG/300ML IV SOLN
1500.0000 mg | INTRAVENOUS | Status: DC
  Filled 2020-03-13: qty 300

## 2020-03-13 MED ORDER — DEXMEDETOMIDINE HCL IN NACL 400 MCG/100ML IV SOLN
0.1000 ug/kg/h | INTRAVENOUS | Status: DC
  Filled 2020-03-13: qty 100

## 2020-03-13 MED ORDER — INSULIN ASPART 100 UNIT/ML ~~LOC~~ SOLN
0.0000 [IU] | Freq: Every day | SUBCUTANEOUS | Status: DC
  Administered 2020-03-16: 22:00:00 2 [IU] via SUBCUTANEOUS
  Administered 2020-03-17 – 2020-03-18 (×2): 3 [IU] via SUBCUTANEOUS
  Administered 2020-03-19: 22:00:00 5 [IU] via SUBCUTANEOUS

## 2020-03-13 MED ORDER — PLASMA-LYTE 148 IV SOLN
INTRAVENOUS | Status: DC
  Filled 2020-03-13: qty 2.5

## 2020-03-13 MED ORDER — SODIUM CHLORIDE 0.9 % IV SOLN
750.0000 mg | INTRAVENOUS | Status: DC
  Filled 2020-03-13: qty 750

## 2020-03-13 MED ORDER — SODIUM CHLORIDE 0.9 % IV SOLN
1.5000 g | INTRAVENOUS | Status: DC
  Filled 2020-03-13: qty 1.5

## 2020-03-13 NOTE — Progress Notes (Addendum)
Progress Note  Patient Name: [name] [name] Date of Encounter: 03/13/2020  Primary Cardiologist: Peter Martinique, MD   Subjective   No complaints this morning.   Inpatient Medications    Scheduled Meds: . amiodarone  200 mg Oral Daily  . aspirin  81 mg Oral Daily  . atorvastatin  40 mg Oral q1800  . citalopram  20 mg Oral Daily  . furosemide  40 mg Intravenous BID  . influenza vaccine adjuvanted  0.5 mL Intramuscular Tomorrow-1000  . insulin aspart  0-20 Units Subcutaneous TID WC  . insulin aspart  0-5 Units Subcutaneous QHS  . metoprolol succinate  100 mg Oral Daily  . pantoprazole  40 mg Oral Daily  . sodium chloride flush  3 mL Intravenous Q12H  . tamsulosin  0.4 mg Oral Daily   Continuous Infusions: . sodium chloride    . sodium chloride 10 mL/hr at 02/19/2020 2300  . heparin 1,200 Units/hr (03/08/2020 2012)   PRN Meds: sodium chloride, sodium chloride, acetaminophen, ALPRAZolam, nitroGLYCERIN, ondansetron (ZOFRAN) IV, sodium chloride flush   Vital Signs    Vitals:   02/28/2020 1939 03/13/20 0524 03/13/20 0525 03/13/20 0830  BP: (!) 138/105 (!) 127/102 (!) 138/92 (!) 142/99  Pulse: 84 82 71 82  Resp: 15 (!) 21 12   Temp: 98 F (36.7 C) 97.7 F (36.5 C)    TempSrc: Oral Oral    SpO2: 97% 98% 97%   Weight:      Height:        Intake/Output Summary (Last 24 hours) at 03/13/2020 1130 Last data filed at 03/13/2020 0500 Gross per 24 hour  Intake 1610.85 ml  Output 184 ml  Net 1426.85 ml   Last 3 Weights 03/02/2020 03/07/2020 02/08/2020  Weight (lbs) 244 lb 14.9 oz 237 lb 254 lb  Weight (kg) 111.1 kg 107.502 kg 115.214 kg      Telemetry    Afib, intermittent pacer spikes - Personally Reviewed  ECG    Afib, rate controlled PVCs - Personally Reviewed  Physical Exam  Pleasant older WM, sitting up in bed.  GEN: No acute distress.   Neck: No JVD Cardiac: Irreg Irreg, + systolic murmurs, rubs, or gallops.  Respiratory: Clear to auscultation bilaterally. GI:  Soft, nontender, non-distended  MS: No edema; No deformity.  Neuro:  Nonfocal  Psych: Normal affect  Labs    High Sensitivity Troponin:  No results for input(s): TROPONINIHS in the last 720 hours.    Chemistry Recent Labs  Lab 03/07/20 1501 03/07/20 1501 02/20/2020 1429 02/27/2020 1434 03/13/20 0331  NA 132*  --  140 140 139  K 4.3   < > 3.3* 3.3* 3.7  CL 95*  --   --   --  103  CO2 19*  --   --   --  26  GLUCOSE 512*  --   --   --  188*  BUN 16  --   --   --  25*  CREATININE 1.55*  --   --   --  1.91*  CALCIUM 9.4  --   --   --  8.8*  GFRNONAA 45*  --   --   --  35*  GFRAA 52*  --   --   --  40*  ANIONGAP  --   --   --   --  10   < > = values in this interval not displayed.     Hematology Recent Labs  Lab 03/06/2020 1429  02/19/2020 1434 03/13/20 0331  WBC  --   --  5.1  RBC  --   --  4.08*  HGB 12.2* 12.6* 12.5*  HCT 36.0* 37.0* 37.6*  MCV  --   --  92.2  MCH  --   --  30.6  MCHC  --   --  33.2  RDW  --   --  13.7  PLT  --   --  165    BNPNo results for input(s): BNP, PROBNP in the last 168 hours.   DDimer No results for input(s): DDIMER in the last 168 hours.   Radiology    CT CHEST WO CONTRAST  Result Date: 02/13/2020 CLINICAL DATA:  Preop CABG. EXAM: CT CHEST WITHOUT CONTRAST TECHNIQUE: Multidetector CT imaging of the chest was performed following the standard protocol without IV contrast. COMPARISON:  March 03, 2018 FINDINGS: Cardiovascular: The ascending thoracic aorta measures approximately 4.7 cm in diameter. Atherosclerotic changes are noted throughout the thoracic aorta. Coronary artery disease is noted. There is no significant pericardial effusion. There is cardiomegaly. There is a dual chamber left-sided pacemaker in place. The patient is status post prior median sternotomy. Mediastinum/Nodes: --No mediastinal or hilar lymphadenopathy. --No axillary lymphadenopathy. --No supraclavicular lymphadenopathy. --Normal thyroid gland. --The esophagus is  unremarkable Lungs/Pleura: There is scattered bilateral calcified granulomas. There is a trace to small left-sided pleural effusion. There is some mild bronchial wall thickening at the lung bases. There is no pneumothorax or large focal infiltrate. Upper Abdomen: There is cholelithiasis without secondary signs of acute cholecystitis. Musculoskeletal: No chest wall abnormality. No acute or significant osseous findings. There is bilateral gynecomastia. IMPRESSION: 1. Cardiomegaly with coronary artery disease. The patient is status post prior median sternotomy. 2. Dual chamber left-sided pacemaker in place that appears well positioned. 3. Ascending thoracic aorta measures approximately 4.7 cm in diameter. Ascending thoracic aortic aneurysm. Recommend semi-annual imaging followup by CTA or MRA and referral to cardiothoracic surgery if not already obtained. This recommendation follows 2010 ACCF/AHA/AATS/ACR/ASA/SCA/SCAI/SIR/STS/SVM Guidelines for the Diagnosis and Management of Patients With Thoracic Aortic Disease. Circulation. 2010; 121: M384-Y659. Aortic aneurysm NOS (ICD10-I71.9) 4. Cholelithiasis. 5. Bilateral gynecomastia. 6. Trace to small left-sided pleural effusion. 7. Aortic Atherosclerosis (ICD10-I70.0). Electronically Signed   By: Constance Holster M.D.   On: 03/08/2020 21:03   CARDIAC CATHETERIZATION  Result Date: 03/13/2020  Dist RCA lesion is 50% stenosed.  3rd RPL lesion is 100% stenosed.  RPDA lesion is 99% stenosed.  Prox RCA lesion is 20% stenosed.  RPAV lesion is 60% stenosed.  3rd Mrg-2 lesion is 100% stenosed.  3rd Mrg-1 lesion is 100% stenosed.  Ost Cx to Mid Cx lesion is 40% stenosed.  1st Diag-1 lesion is 10% stenosed.  1st Diag-2 lesion is 90% stenosed.  2nd Diag lesion is 99% stenosed.  Prox LAD to Mid LAD lesion is 70% stenosed.  Mid LAD to Dist LAD lesion is 60% stenosed.  1. Severe triple vessel CAD 2. The LAD wraps the apex. The LAD has a patent mid stented segment with  moderate restenosis. The remainder of the mid and distal LAD has diffuse moderate to severe stenosis. There are two moderate caliber diagonal branches that arise from the mid LAD. The first diagonal branch has a patent proximal stent and then severe mid stenosis. The second diagonal branch has diffuse severe stenosis. 3. The Circumflex has moderate proximal stenosis. The large obtuse marginal that has been stented is now occluded and fills from left to right collaterals. 4. The RCA  is a large dominant vessel with a large PDA and moderate caliber posterolateral artery. The PDA has a severe ostial stenosis. The posterolateral artery has severe proximal stenosis and total mid occlusion. 5. Elevated wedge pressure. 6. Cardiac output 3.88, CI 1.71 Recommendations: He will be admitted to telemetry. We will need to get more information prior to planning for revascularization. We will need to optimize his fluid status. I will arrange an echo today to evaluate his valves and LVEF. He is volume overloaded and will be started on IV diuretics. I will ask CT surgery to see him to review CABG as a revascularization option. He has diffuse multi-vessel CAD with sub-optimal targets. His vessels are not favorable for PCI. The PDA would be a target for stenting.  Continue statin and beta blocker. Will start ASA. His oral anticoagulation is on hold. Will resume IV heparin 6 hours post sheath removal.    Cardiac Studies   Cath: 02/24/2020   Dist RCA lesion is 50% stenosed.  3rd RPL lesion is 100% stenosed.  RPDA lesion is 99% stenosed.  Prox RCA lesion is 20% stenosed.  RPAV lesion is 60% stenosed.  3rd Mrg-2 lesion is 100% stenosed.  3rd Mrg-1 lesion is 100% stenosed.  Ost Cx to Mid Cx lesion is 40% stenosed.  1st Diag-1 lesion is 10% stenosed.  1st Diag-2 lesion is 90% stenosed.  2nd Diag lesion is 99% stenosed.  Prox LAD to Mid LAD lesion is 70% stenosed.  Mid LAD to Dist LAD lesion is 60% stenosed.    1. Severe triple vessel CAD 2. The LAD wraps the apex. The LAD has a patent mid stented segment with moderate restenosis. The remainder of the mid and distal LAD has diffuse moderate to severe stenosis. There are two moderate caliber diagonal branches that arise from the mid LAD. The first diagonal branch has a patent proximal stent and then severe mid stenosis. The second diagonal branch has diffuse severe stenosis.  3. The Circumflex has moderate proximal stenosis. The large obtuse marginal that has been stented is now occluded and fills from left to right collaterals.  4. The RCA is a large dominant vessel with a large PDA and moderate caliber posterolateral artery. The PDA has a severe ostial stenosis. The posterolateral artery has severe proximal stenosis and total mid occlusion.  5. Elevated wedge pressure.  6. Cardiac output 3.88, CI 1.71   Recommendations: He will be admitted to telemetry. We will need to get more information prior to planning for revascularization. We will need to optimize his fluid status. I will arrange an echo today to evaluate his valves and LVEF. He is volume overloaded and will be started on IV diuretics. I will ask CT surgery to see him to review CABG as a revascularization option. He has diffuse multi-vessel CAD with sub-optimal targets. His vessels are not favorable for PCI. The PDA would be a target for stenting.  Continue statin and beta blocker. Will start ASA. His oral anticoagulation is on hold. Will resume IV heparin 6 hours post sheath removal.   Diagnostic Dominance: Right    Patient Profile     69 y.o. male with history of CAD with prior stenting of the LAD, Diagonal and OM in an outside hospital, ischemic cardiomyopathy with ICD in place, chronic systolic CHF, MVP s/p mitral valve repair in 2004 at [facility], permanent atrial fib on chronic anticoagulation, HTN, HLD, DM, depression who was admitted to [facility] with c/o progressive weakness,  fatigue, dyspnea  and chest pressure. Troponin mildly elevated at 0.06. He was transferred to [facility] for cardiac cath. His cardiac catheterization shows severe three vessel CAD.   Assessment & Plan    1. Chest pain: transferred from [facility] with chest pain. Cath noted above with severe 3v disease. TCTS consulted with recommendations for CABG 03/14/20. No chest pain overnight, some nausea but was able to eat breakfast. Undergoing pre CABG testing.   -- remains on IV heparin, ASA, statin and BB  2. Persistent Afib/flutter: followed and recently seen by EP. Remains on amiodarone/BB therapy. Rates generally controlled.  -- Eliquis held, on IV heparin.   3. MVP s/p mitral valve repair: back in 2004. Echo 1/21 mild mitral valve regurg. Repeat echo pending.   4. ICM with ICD: EF 25-30%. Wedge pressure was elevated. Started on IV lasix 40mg  BID. Remains net +, but reports breathing is stable. Will continue with diuresis today.  -- Device followed now by Dr. Lovena Le.   5. HTN: blood pressures are borderline controlled. Will add low dose amlodipine.   6. HL: on atorvastatin 40mg  daily, will further increase to 80mg  daily.   7. DM: Hgb A1c 11. Reported he was not consistently taking his insulin prior to admission. CM consulted for assistance.  -- SSI   For questions or updates, please contact [place] Please consult [email or web address] for contact info under        Signed, Reino Bellis, NP  03/13/2020, 11:31 AM    Patient seen and examined. Agree with assessment and plan.  I personally reviewed angiographic images.  Severe multivessel CAD and agree CABG revascularization best option.  No recurrent chest pain.  Tentative plan is for CABG revascularization surgery tomorrow March 14, 2020 with Dr. Orvan Seen.  Titrating atorvastatin to 80 mg daily.  Ischemic cardiomyopathy with EF 25 to 30% currently on Lasix.  I/O positive since admission.  Echo completed, results pending.  Will need more optimal  diabetic management.   Troy Sine, MD, [facility] 03/13/2020 1:07 PM

## 2020-03-13 NOTE — Progress Notes (Signed)
*  PRELIMINARY RESULTS* Echocardiogram 2D Echocardiogram has been performed.  [name] [name] 03/13/2020, 9:29 AM

## 2020-03-13 NOTE — Care Management (Addendum)
Per Darlene P. W/[employer] co-pay for Novolog Flexpen zero co-pay, Also covered are Solostar,Levemir,Lantus. Basaglar [employer] non-formulary.not covered.  No PA required No deductible Tier 3 Retail Pharmacy [facility]  [phone]

## 2020-03-13 NOTE — Progress Notes (Signed)
Dopplers for pre op completed.  June Leap, BS, RDMS, RVT

## 2020-03-13 NOTE — Progress Notes (Addendum)
Plan of care reviewed. Pt's progressing.Appeared alert and oriented x 4, ambulated independently. Tolerated well, denied chest pain. Puncture wounds on right brachial, right femoral and right radial were negative for hematoma, no bleeding.Vital signs stable. SPO2 97% on room air,Atrial fibrillation, HR 70s-80s,  BP 138/105 mmHg ( MAP 115), remained afebrile,Temp 98.0 orally. He's continuing on Heparin 1200 units / hr. No immediate distress noted. We will continue to monitor.  Kennyth Lose, RN

## 2020-03-13 NOTE — Anesthesia Preprocedure Evaluation (Deleted)
Anesthesia Evaluation    Airway        Dental   Pulmonary  02/16/2020 SARS coronavirus NEG Oval Linsey)          Cardiovascular hypertension, Pt. on medications and Pt. on home beta blockers + angina + CAD (severe 3v ASCAD) and + Cardiac Stents  + dysrhythmias Atrial Fibrillation + Cardiac Defibrillator   03/13/2020 ECHO: Global hypokinesis with akinesis of the inferolateral wall; severe LV dysfunction; elevated LV filling pressure; mild LVH; mild LVE; severe MR; biatrial enlargement; mild RV dysfunction; mild TR with  moderate pulmonary hypertension.  2. Left ventricular ejection fraction, by estimation, is <20%. LV has severely decreased function, with no  regional wall motion abnormalities, LV internal cavity size was mildly dilated. There is mild LVH.  3. Right ventricular systolic function is mildly reduced,  moderately elevated pulmonary artery systolic pressure.  4. Left atrial size was severely dilated.  5. Right atrial size was moderately dilated.  6. The mitral valve is normal in structure. Severe MR    Neuro/Psych    GI/Hepatic GERD  Medicated,  Endo/Other  diabetes, Insulin Dependent  Renal/GU Renal InsufficiencyRenal disease (creat 1.91)     Musculoskeletal   Abdominal   Peds  Hematology eliquis   Anesthesia Other Findings   Reproductive/Obstetrics                            Anesthesia Physical Anesthesia Plan  ASA: IV  Anesthesia Plan: General   Post-op Pain Management:    Induction: Intravenous  PONV Risk Score and Plan: 2 and Treatment may vary due to age or medical condition  Airway Management Planned: Oral ETT  Additional Equipment: Arterial line, PA Cath, TEE and Ultrasound Guidance Line Placement  Intra-op Plan:   Post-operative Plan: Post-operative intubation/ventilation  Informed Consent:   Plan Discussed with:   Anesthesia Plan Comments:          Anesthesia Quick Evaluation

## 2020-03-13 NOTE — Progress Notes (Signed)
Pharmacy note:   69 yo male admitted with CP and s/p cath with 3VCAD.and for possible CABG on 4/2. Pharmacy consulted to assist with home insulin regimen -A1c= 11.1, CBGs 151-276 -GFR ~ 35 -he is currently on moderate sliding scale insulin  At home, he uses insulin aspartate 6 units tid with meals and insulin glargine 50 units daily. He has tried metformin in the past.   Plans -Will change SSI to resistant -No basal insulin recommended now due to plans for CABG on 4/2 -If renal function improves could look into Jardiance  Hildred Laser, PharmD Clinical Pharmacist **Pharmacist phone directory can now be found on [email or web address] ([email or web address]).  Listed under [place].

## 2020-03-13 NOTE — Progress Notes (Signed)
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 Liraglutide Nausea And Vomiting  . Lisinopril Cough    Patient Measurements: Height: 6' (182.9 cm) Weight: 111.1 kg (244 lb 14.9 oz) IBW/kg (Calculated) : 77.6 Heparin Dosing Weight: 97 kg  Vital Signs: Temp: 97.7 F (36.5 C) (04/01 0524) Temp Source: Oral (04/01 0524) BP: 142/99 (04/01 0830) Pulse Rate: 82 (04/01 0830)  Labs: Recent Labs    02/21/2020 1429 02/21/2020 1429 02/22/2020 1434 03/13/20 0331  HGB 12.2*   < > 12.6* 12.5*  HCT 36.0*  --  37.0* 37.6*  PLT  --   --   --  165  HEPARINUNFRC  --   --   --  1.00*  CREATININE  --   --   --  1.91*   < > = values in this interval not displayed.    Estimated Creatinine Clearance: 46.3 mL/min (A) (by C-G formula based on SCr of 1.91 mg/dL (H)).   Medical History: Past Medical History:  Diagnosis Date  . Anxiety   . Arthritis   . CAD (coronary artery disease)    a.  1993 s/p MI - [employer];  b. s/p BMS to LAD '00;  c. PTCA 2nd diagonal 2010;  d. 02/18/12 Cath: moderate nonobs dzs - med rx;  e.  01/2015 Cath: LM nl, LAD 40-41m ISR, 70-10m/d, d1 90p (3.0x16 Synergy DES), D2 50-60, LCX nl, OM1 50p, 30m (2.5x12 Synergy DES), RCA nl, EF 30-35%.  . Chronic combined systolic and diastolic CHF (congestive heart failure) ([place])    a. 12/2014 Echo: EF 30-35%, Gr2 DD, mod MR, sev dil LA.  . CKD (chronic kidney disease), stage III   . Depression   . ED (erectile dysfunction)   . GERD (gastroesophageal reflux disease)   . Hyperlipidemia   . Hypertension   . Ischemic cardiomyopathy    a. s/p St. [name] (Atlas) ICD implanted in [place] 2007;  b. 12/2014 Echo: Ef 30-35%.  . MVP (mitral valve prolapse)    a. s/p MV annuloplasty at [facility] [address] 2004.  Marland Kitchen Persistent atrial fibrillation ([place])    a. noted on ICD interrogation '10 - not previously on [place] - CHA2DS2VASc = 5.  . Type II diabetes mellitus ([place])     uncontrolled    Medications:  Medications Prior to Admission  Medication Sig Dispense Refill Last Dose  . ALPRAZolam (XANAX) 0.25 MG tablet Take 0.25 mg by mouth 3 (three) times daily as needed for anxiety.    unknown  . amiodarone (PACERONE) 200 MG tablet Take 200 mg by mouth daily.    03/11/2020 at Unknown time  . apixaban (ELIQUIS) 5 MG TABS tablet Take 1 tablet (5 mg total) by mouth 2 (two) times daily. 60 tablet 1 03/11/2020 at 0600  . atorvastatin (LIPITOR) 40 MG tablet Take 1 tablet (40 mg total) by mouth daily at 6 PM. 30 tablet 0 03/10/2020 at Unknown time  . citalopram (CELEXA) 20 MG tablet Take 20 mg by mouth daily.    03/11/2020 at Unknown time  . furosemide (LASIX) 40 MG tablet Take 40 mg by mouth daily.    03/11/2020 at Unknown time  . insulin aspart (NOVOLOG FLEXPEN) 100 UNIT/ML FlexPen Inject 6 Units into the skin 3 (three) times daily with meals. 15 mL 0 03/11/2020 at Unknown time  . Insulin Glargine (BASAGLAR KWIKPEN) 100 UNIT/ML SOPN Inject 0.5 mLs (50 Units total) into the skin daily.  15 mL 1 03/11/2020 at Unknown time  . metoprolol succinate (TOPROL-XL) 100 MG 24 hr tablet Take 1 tablet (100 mg total) by mouth daily. 90 tablet 3 03/11/2020 at 0600  . nitroGLYCERIN (NITROSTAT) 0.4 MG SL tablet Place 1 tablet (0.4 mg total) under the tongue every 5 (five) minutes x 3 doses as needed for chest pain. 10 tablet 0 unknown  . pantoprazole (PROTONIX) 40 MG tablet Take 1 tablet (40 mg total) by mouth daily. 30 tablet 1 03/11/2020 at Unknown time  . potassium chloride SA (KLOR-CON) 20 MEQ tablet Take 20 mEq by mouth daily.    03/11/2020 at Unknown time  . tamsulosin (FLOMAX) 0.4 MG CAPS capsule Take 1 capsule (0.4 mg total) by mouth daily. 30 capsule 0 03/11/2020 at Unknown time  . ALPRAZolam (XANAX) 1 MG tablet Take 1 tablet (1 mg total) by mouth 2 (two) times daily as needed for anxiety. 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 Pharmacy consulted dose IV heparin  -aPTT at goal -heparin level elevated (he is on apixaban PTA for afib; last dose 03/11/20)  Goal of Therapy:  Heparin level 0.3-0.7 units/ml Monitor platelets by anticoagulation protocol: Yes   Plan:  -No heparin changes needed -Monitor daily HL, aPTT CBC   Hildred Laser, PharmD Clinical Pharmacist **Pharmacist phone directory can now be found on [email or web address] ([email or web address]).  Listed under [place].

## 2020-03-13 NOTE — Progress Notes (Addendum)
Inpatient Diabetes Program Recommendations  AACE/ADA: New Consensus Statement on Inpatient Glycemic Control   Target Ranges:  Prepandial:   less than 140 mg/dL      Peak postprandial:   less than 180 mg/dL (1-2 hours)      Critically ill patients:  140 - 180 mg/dL   Results for [name] [name], [name] [name] (MRN [id number]) as of 03/13/2020 09:57  Ref. Range 02/11/2020 15:02 02/27/2020 16:31 03/06/2020 21:35 03/13/2020 06:24 03/13/2020 08:29  Glucose-Capillary Latest Ref Range: 70 - 99 mg/dL 166 (H) 151 (H) 276 (H) 164 (H) 131 (H)  Results for [name] [name], [name] [name] (MRN [id number]) as of 03/13/2020 09:57  Ref. Range 01/06/2020 05:32 02/27/2020 16:45  Hemoglobin A1C Latest Ref Range: 4.8 - 5.6 % 11.0 (H) 11.1 (H)   Review of Glycemic Control  Diabetes history: DM2 Outpatient Diabetes medications: Basaglar 50 units daily, Novolog 6 units TID with meals Current orders for Inpatient glycemic control: Novolog 0-20 units TID with meals, Novolog 0-5 units QHS  Inpatient Diabetes Program Recommendations:   Insulin-Basal: If glucose becomes consistently greater than 180 mg/dl with Novolog correction, please order basal insulin.  HgbA1C: A1C 11.1% on 02/20/2020 indicating an average glucose of 272 mg/dl over the past 2-3 months.At time of discharge, please provide patient with Rx for Lantus SoloStar insulin pens [phone]) which is $0 copay per CM.  NOTE: Patient was recently inpatient 01/04/20 - 01/08/20 and was seen by Inpatient Diabetes Coordinator on 01/07/20. At that time, patient reported that he was not taking insulin consistently due to cost. Will consult TOC to see if patient would qualify for any assistance with medications.   Addendum 03/13/20@15 :05: Received page from [place], Case Manager with an update on cost of insulin with patient's insurance. Basaglar is not covered and very expensive and Novolog is $0 copay. Basal insulin could be changed to Lantus or Levemir since both are $0 copay. At time of discharge,please  provide patient with Rx for Lantus SoloStar insulin pens [phone]).  Thanks, Barnie Alderman, RN, MSN, CDE Diabetes Coordinator Inpatient Diabetes Program [phone] (Team Pager from 8am to 5pm)

## 2020-03-13 DEATH — deceased

## 2020-03-14 DIAGNOSIS — I200 Unstable angina: Secondary | ICD-10-CM | POA: Diagnosis not present

## 2020-03-14 DIAGNOSIS — N1832 Chronic kidney disease, stage 3b: Secondary | ICD-10-CM | POA: Diagnosis not present

## 2020-03-14 DIAGNOSIS — R7989 Other specified abnormal findings of blood chemistry: Secondary | ICD-10-CM | POA: Diagnosis not present

## 2020-03-14 DIAGNOSIS — I4811 Longstanding persistent atrial fibrillation: Secondary | ICD-10-CM | POA: Diagnosis not present

## 2020-03-14 LAB — GLUCOSE, CAPILLARY
Glucose-Capillary: 158 mg/dL — ABNORMAL HIGH (ref 70–99)
Glucose-Capillary: 155 mg/dL — ABNORMAL HIGH (ref 70–99)
Glucose-Capillary: 148 mg/dL — ABNORMAL HIGH (ref 70–99)
Glucose-Capillary: 145 mg/dL — ABNORMAL HIGH (ref 70–99)

## 2020-03-14 LAB — HEPARIN LEVEL (UNFRACTIONATED)
Heparin Unfractionated: 0.77 IU/mL — ABNORMAL HIGH (ref 0.30–0.70)
Heparin Unfractionated: 0.45 IU/mL (ref 0.30–0.70)

## 2020-03-14 LAB — BASIC METABOLIC PANEL
Anion gap: 13 (ref 5–15)
BUN: 31 mg/dL — ABNORMAL HIGH (ref 8–23)
CO2: 24 mmol/L (ref 22–32)
Calcium: 8.6 mg/dL — ABNORMAL LOW (ref 8.9–10.3)
Chloride: 100 mmol/L (ref 98–111)
Creatinine, Ser: 2.17 mg/dL — ABNORMAL HIGH (ref 0.61–1.24)
GFR calc Af Amer: 34 mL/min — ABNORMAL LOW (ref >60)
GFR calc non Af Amer: 30 mL/min — ABNORMAL LOW (ref >60)
Glucose, Bld: 191 mg/dL — ABNORMAL HIGH (ref 70–99)
Potassium: 3.6 mmol/L (ref 3.5–5.1)
Sodium: 137 mmol/L (ref 135–145)

## 2020-03-14 LAB — APTT
aPTT: 126 seconds — ABNORMAL HIGH (ref 24–36)
aPTT: 72 seconds — ABNORMAL HIGH (ref 24–36)
aPTT: 71 seconds — ABNORMAL HIGH (ref 24–36)

## 2020-03-14 MED ORDER — HEPARIN (PORCINE) 25000 UT/250ML-% IV SOLN
1100.0000 [IU]/h | INTRAVENOUS | Status: AC
  Administered 2020-03-14: 900 [IU]/h via INTRAVENOUS
  Administered 2020-03-15 – 2020-03-16 (×2): 1100 [IU]/h via INTRAVENOUS
  Filled 2020-03-14 (×4): qty 250

## 2020-03-14 MED ORDER — LIVING WELL WITH DIABETES BOOK
Freq: Once | Status: AC
  Filled 2020-03-14: qty 1

## 2020-03-14 MED ORDER — FUROSEMIDE 10 MG/ML IJ SOLN: 40.0000 mg | Freq: Every day | INTRAMUSCULAR | Status: DC

## 2020-03-14 NOTE — Progress Notes (Signed)
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 Liraglutide Nausea And Vomiting  . Lisinopril Cough    Patient Measurements: Height: 6' (182.9 cm) Weight: 111.1 kg (244 lb 14.9 oz) IBW/kg (Calculated) : 77.6 Heparin Dosing Weight: 101.2 kg  Vital Signs: Temp: 97.6 F (36.4 C) (04/02 0345) Temp Source: Oral (04/02 0345) BP: 99/75 (04/02 0345) Pulse Rate: 71 (04/02 0345)  Labs: Recent Labs    03/09/2020 1429 02/24/2020 1429 02/24/2020 1434 03/13/20 0331 03/13/20 1149 03/14/20 0358  HGB 12.2*   < > 12.6* 12.5*  --   --   HCT 36.0*  --  37.0* 37.6*  --   --   PLT  --   --   --  165  --   --   APTT  --   --   --   --  78* 126*  HEPARINUNFRC  --   --   --  1.00*  --  0.77*  CREATININE  --   --   --  1.91*  --   --    < > = values in this interval not displayed.    Estimated Creatinine Clearance: 46.3 mL/min (A) (by C-G formula based on SCr of 1.91 mg/dL (H)).   Medical History: Past Medical History:  Diagnosis Date  . Anxiety   . Arthritis   . CAD (coronary artery disease)    a.  1993 s/p MI - [facility];  b. s/p BMS to LAD '00;  c. PTCA 2nd diagonal 2010;  d. 02/18/12 Cath: moderate nonobs dzs - med rx;  e.  01/2015 Cath: LM nl, LAD 40-82m ISR, 70-83m/d, d1 90p (3.0x16 Synergy DES), D2 50-60, LCX nl, OM1 50p, 82m (2.5x12 Synergy DES), RCA nl, EF 30-35%.  . Chronic combined systolic and diastolic CHF (congestive heart failure) ([place])    a. 12/2014 Echo: EF 30-35%, Gr2 DD, mod MR, sev dil LA.  . CKD (chronic kidney disease), stage III   . Depression   . ED (erectile dysfunction)   . GERD (gastroesophageal reflux disease)   . Hyperlipidemia   . Hypertension   . Ischemic cardiomyopathy    a. s/p St. [name] (Atlas) ICD implanted in [place] 2007;  b. 12/2014 Echo: Ef 30-35%.  . MVP (mitral valve prolapse)    a. s/p MV annuloplasty at [facility] 2004.  Marland Kitchen Persistent atrial fibrillation ([place])     a. noted on ICD interrogation '10 - not previously on [place] - CHA2DS2VASc = 5.  . Type II diabetes mellitus ([place])    uncontrolled    Medications:  Medications Prior to Admission  Medication Sig Dispense Refill Last Dose  . ALPRAZolam (XANAX) 0.25 MG tablet Take 0.25 mg by mouth 3 (three) times daily as needed for anxiety.    unknown  . amiodarone (PACERONE) 200 MG tablet Take 200 mg by mouth daily.    03/11/2020 at Unknown time  . apixaban (ELIQUIS) 5 MG TABS tablet Take 1 tablet (5 mg total) by mouth 2 (two) times daily. 60 tablet 1 03/11/2020 at 0600  . atorvastatin (LIPITOR) 40 MG tablet Take 1 tablet (40 mg total) by mouth daily at 6 PM. 30 tablet 0 03/10/2020 at Unknown time  . citalopram (CELEXA) 20 MG tablet Take 20 mg by mouth daily.    03/11/2020 at Unknown time  . furosemide (LASIX) 40 MG tablet Take 40 mg by mouth daily.    03/11/2020 at Unknown  time  . insulin aspart (NOVOLOG FLEXPEN) 100 UNIT/ML FlexPen Inject 6 Units into the skin 3 (three) times daily with meals. 15 mL 0 03/11/2020 at Unknown time  . Insulin Glargine (BASAGLAR KWIKPEN) 100 UNIT/ML SOPN Inject 0.5 mLs (50 Units total) into the skin daily. 15 mL 1 03/11/2020 at Unknown time  . metoprolol succinate (TOPROL-XL) 100 MG 24 hr tablet Take 1 tablet (100 mg total) by mouth daily. 90 tablet 3 03/11/2020 at 0600  . nitroGLYCERIN (NITROSTAT) 0.4 MG SL tablet Place 1 tablet (0.4 mg total) under the tongue every 5 (five) minutes x 3 doses as needed for chest pain. 10 tablet 0 unknown  . pantoprazole (PROTONIX) 40 MG tablet Take 1 tablet (40 mg total) by mouth daily. 30 tablet 1 03/11/2020 at Unknown time  . potassium chloride SA (KLOR-CON) 20 MEQ tablet Take 20 mEq by mouth daily.    03/11/2020 at Unknown time  . tamsulosin (FLOMAX) 0.4 MG CAPS capsule Take 1 capsule (0.4 mg total) by mouth daily. 30 capsule 0 03/11/2020 at Unknown time  . ALPRAZolam (XANAX) 1 MG tablet Take 1 tablet (1 mg total) by mouth 2 (two) times daily as needed  for anxiety. 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 Pharmacy consulted dose IV heparin   aPTT now high at 126 after previously therapeutic at the same rate. Heparin level remains elevated at 0.77 (on apixaban PTA for afib; last dose 03/11/20). Per discussion with RN, heparin level was drawn appropriately for opposite arm. CBC stable. No active bleed issues reported.  Goal of Therapy:  Heparin level 0.3-0.7 units/ml Monitor platelets by anticoagulation protocol: Yes   Plan:  Hold heparin x 1 hour and resume at lower rate of 900 units/hr at 0645 8hr heparin level from resumption Monitor daily heparin level and CBC, s/sx bleeding F/u CABG plans - tentatively scheduled for 4/2   Arturo Morton, PharmD, BCPS Please check AMION for all [facility] contact numbers Clinical Pharmacist 03/14/2020 5:52 AM

## 2020-03-14 NOTE — Progress Notes (Signed)
Inpatient Diabetes Program Recommendations  AACE/ADA: New Consensus Statement on Inpatient Glycemic Control   Target Ranges:  Prepandial:   less than 140 mg/dL      Peak postprandial:   less than 180 mg/dL (1-2 hours)      Critically ill patients:  140 - 180 mg/dL    Results for [name] [name], [name] [name] (MRN [id number]) as of 03/14/2020 13:54  Ref. Range 03/13/2020 06:24 03/13/2020 08:29 03/13/2020 10:56 03/13/2020 15:47 03/13/2020 21:04 03/14/2020 06:19 03/14/2020 11:23  Glucose-Capillary Latest Ref Range: 70 - 99 mg/dL 164 (H) 131 (H) 87 166 (H) 157 (H) 158 (H) 155 (H)   Review of Glycemic Control  Diabetes history: DM2 Outpatient Diabetes medications: Basaglar 50 units daily, Novolog 6 units TID with meals Current orders for Inpatient glycemic control: Novolog 0-20 units TID with meals, Novolog 0-5 units QHS  Inpatient Diabetes Program Recommendations:   HgbA1C: A1C 11.1% on 02/20/2020 indicating an average glucose of 272 mg/dl over the past 2-3 months. Patient reports that he is now has Engineer, agricultural and Novolog insulin at home and it is not costing him anything.   NOTE: Patient was recently inpatient 01/04/20 - 01/08/20 and was seen by Inpatient Diabetes Coordinator on 01/07/20. Spoke with patient to follow up regarding A1C and prior report of having difficulty affording insulin. Patient states that since he was last in the hospital, he was changed to a different medication plan because he was considered low income based on social security.   With the change, he is able to now get insulin for free. Patient reports that he has Consulting civil engineer at home. Patient states he is planning to have CABG. Discussed protocol with IV insulin that would be started on morning of surgery and how IV insulin would be continued and transitioned to SQ insulin (per TCTS protocol). Explained that following procedure, it would be important to maintain tight glycemic control and insulin needs may change. Discussed glucose and A1C goals.  Discussed impact of nutrition, exercise, stress, sickness, and medications on diabetes control.  Discussed carbohydrate modified diet. Patient states he would like to talk with RD for additional education on Carb Modified diet and patient states he would like handouts on meal planning and ideas for meals.  Informed patient RD would be consulted. Patient verbalized understanding of information discussed and reports no further questions at this time related to diabetes.  Thanks, Barnie Alderman, RN, MSN, CDE Diabetes Coordinator Inpatient Diabetes Program [phone] (Team Pager)

## 2020-03-14 NOTE — Discharge Instructions (Addendum)

## 2020-03-14 NOTE — Progress Notes (Addendum)
Progress Note  Patient Name: [name] [name] Date of Encounter: 03/14/2020  Primary Cardiologist: Peter Martinique, MD   Subjective   Feeling well this morning. No chest pain.   Inpatient Medications    Scheduled Meds: . amiodarone  200 mg Oral Daily  . amLODipine  5 mg Oral Daily  . aspirin  81 mg Oral Daily  . atorvastatin  80 mg Oral q1800  . citalopram  20 mg Oral Daily  . epinephrine  0-10 mcg/min Intravenous To OR  . furosemide  40 mg Intravenous BID  . heparin-papaverine-plasmalyte irrigation   Irrigation To OR  . influenza vaccine adjuvanted  0.5 mL Intramuscular Tomorrow-1000  . insulin aspart  0-20 Units Subcutaneous TID WC  . insulin aspart  0-5 Units Subcutaneous QHS  . insulin   Intravenous To OR  . magnesium sulfate  40 mEq Other To OR  . metoprolol succinate  100 mg Oral Daily  . pantoprazole  40 mg Oral Daily  . phenylephrine  30-200 mcg/min Intravenous To OR  . potassium chloride  80 mEq Other To OR  . sodium chloride flush  3 mL Intravenous Q12H  . tamsulosin  0.4 mg Oral Daily  . tranexamic acid  15 mg/kg Intravenous To OR  . tranexamic acid  2 mg/kg Intracatheter To OR   Continuous Infusions: . sodium chloride    . sodium chloride 10 mL/hr at 02/19/2020 2300  . cefUROXime (ZINACEF)  IV    . cefUROXime (ZINACEF)  IV    . dexmedetomidine    . heparin 30,000 units/NS 1000 mL solution for CELLSAVER    . heparin 900 Units/hr (03/14/20 0656)  . milrinone    . nitroGLYCERIN    . norepinephrine    . tranexamic acid (CYKLOKAPRON) infusion ([facility])    . vancomycin     PRN Meds: sodium chloride, sodium chloride, acetaminophen, ALPRAZolam, nitroGLYCERIN, ondansetron (ZOFRAN) IV, sodium chloride flush   Vital Signs    Vitals:   03/13/20 2047 03/13/20 2156 03/14/20 0345 03/14/20 0838  BP: 99/73 109/72 99/75 113/83  Pulse: 64 70 71 64  Resp: 18 18 18 18   Temp: 97.7 F (36.5 C)  97.6 F (36.4 C) 97.9 F (36.6 C)  TempSrc: Oral  Oral Oral  SpO2: 100%  100% 100% 96%  Weight:      Height:        Intake/Output Summary (Last 24 hours) at 03/14/2020 0953 Last data filed at 03/13/2020 2156 Gross per 24 hour  Intake 896.67 ml  Output 700 ml  Net 196.67 ml   Last 3 Weights 03/02/2020 03/07/2020 02/08/2020  Weight (lbs) 244 lb 14.9 oz 237 lb 254 lb  Weight (kg) 111.1 kg 107.502 kg 115.214 kg      Telemetry    Afib rate controlled - Personally Reviewed  ECG    No new tracing this morning.   Physical Exam  Pleasant older WM, sitting on the side of the bed.  GEN: No acute distress.   Neck: No JVD Cardiac: Irreg Irreg, + systolic murmur, no rubs, or gallops.  Respiratory: Clear to auscultation bilaterally. GI: Soft, nontender, non-distended  MS: No edema; No deformity. Neuro:  Nonfocal  Psych: Normal affect   Labs    High Sensitivity Troponin:  No results for input(s): TROPONINIHS in the last 720 hours.    Chemistry Recent Labs  Lab 03/07/20 1501 03/07/20 1501 02/20/2020 1429 02/29/2020 1434 03/13/20 0331  NA 132*  --  140 140 139  K 4.3   < >  3.3* 3.3* 3.7  CL 95*  --   --   --  103  CO2 19*  --   --   --  26  GLUCOSE 512*  --   --   --  188*  BUN 16  --   --   --  25*  CREATININE 1.55*  --   --   --  1.91*  CALCIUM 9.4  --   --   --  8.8*  GFRNONAA 45*  --   --   --  35*  GFRAA 52*  --   --   --  40*  ANIONGAP  --   --   --   --  10   < > = values in this interval not displayed.     Hematology Recent Labs  Lab 02/21/2020 1429 03/04/2020 1434 03/13/20 0331  WBC  --   --  5.1  RBC  --   --  4.08*  HGB 12.2* 12.6* 12.5*  HCT 36.0* 37.0* 37.6*  MCV  --   --  92.2  MCH  --   --  30.6  MCHC  --   --  33.2  RDW  --   --  13.7  PLT  --   --  165   Lipid Panel     Component Value Date/Time   CHOL 124 01/06/2020 0532   TRIG 147 01/06/2020 0532   HDL 32 (L) 01/06/2020 0532   CHOLHDL 3.9 01/06/2020 0532   VLDL 29 01/06/2020 0532   LDLCALC 63 01/06/2020 0532    BNPNo results for input(s): BNP, PROBNP in the last  168 hours.   DDimer No results for input(s): DDIMER in the last 168 hours.   Radiology    CT CHEST WO CONTRAST  Result Date: 02/28/2020 CLINICAL DATA:  Preop CABG. EXAM: CT CHEST WITHOUT CONTRAST TECHNIQUE: Multidetector CT imaging of the chest was performed following the standard protocol without IV contrast. COMPARISON:  March 03, 2018 FINDINGS: Cardiovascular: The ascending thoracic aorta measures approximately 4.7 cm in diameter. Atherosclerotic changes are noted throughout the thoracic aorta. Coronary artery disease is noted. There is no significant pericardial effusion. There is cardiomegaly. There is a dual chamber left-sided pacemaker in place. The patient is status post prior median sternotomy. Mediastinum/Nodes: --No mediastinal or hilar lymphadenopathy. --No axillary lymphadenopathy. --No supraclavicular lymphadenopathy. --Normal thyroid gland. --The esophagus is unremarkable Lungs/Pleura: There is scattered bilateral calcified granulomas. There is a trace to small left-sided pleural effusion. There is some mild bronchial wall thickening at the lung bases. There is no pneumothorax or large focal infiltrate. Upper Abdomen: There is cholelithiasis without secondary signs of acute cholecystitis. Musculoskeletal: No chest wall abnormality. No acute or significant osseous findings. There is bilateral gynecomastia. IMPRESSION: 1. Cardiomegaly with coronary artery disease. The patient is status post prior median sternotomy. 2. Dual chamber left-sided pacemaker in place that appears well positioned. 3. Ascending thoracic aorta measures approximately 4.7 cm in diameter. Ascending thoracic aortic aneurysm. Recommend semi-annual imaging followup by CTA or MRA and referral to cardiothoracic surgery if not already obtained. This recommendation follows 2010 ACCF/AHA/AATS/ACR/ASA/SCA/SCAI/SIR/STS/SVM Guidelines for the Diagnosis and Management of Patients With Thoracic Aortic Disease. Circulation. 2010; 121:  U235-T614. Aortic aneurysm NOS (ICD10-I71.9) 4. Cholelithiasis. 5. Bilateral gynecomastia. 6. Trace to small left-sided pleural effusion. 7. Aortic Atherosclerosis (ICD10-I70.0). Electronically Signed   By: Constance Holster M.D.   On: 03/11/2020 21:03   CARDIAC CATHETERIZATION  Result Date: 03/13/2020  Dist RCA lesion is 50%  stenosed.  3rd RPL lesion is 100% stenosed.  RPDA lesion is 99% stenosed.  Prox RCA lesion is 20% stenosed.  RPAV lesion is 60% stenosed.  3rd Mrg-2 lesion is 100% stenosed.  3rd Mrg-1 lesion is 100% stenosed.  Ost Cx to Mid Cx lesion is 40% stenosed.  1st Diag-1 lesion is 10% stenosed.  1st Diag-2 lesion is 90% stenosed.  2nd Diag lesion is 99% stenosed.  Prox LAD to Mid LAD lesion is 70% stenosed.  Mid LAD to Dist LAD lesion is 60% stenosed.  1. Severe triple vessel CAD 2. The LAD wraps the apex. The LAD has a patent mid stented segment with moderate restenosis. The remainder of the mid and distal LAD has diffuse moderate to severe stenosis. There are two moderate caliber diagonal branches that arise from the mid LAD. The first diagonal branch has a patent proximal stent and then severe mid stenosis. The second diagonal branch has diffuse severe stenosis. 3. The Circumflex has moderate proximal stenosis. The large obtuse marginal that has been stented is now occluded and fills from left to right collaterals. 4. The RCA is a large dominant vessel with a large PDA and moderate caliber posterolateral artery. The PDA has a severe ostial stenosis. The posterolateral artery has severe proximal stenosis and total mid occlusion. 5. Elevated wedge pressure. 6. Cardiac output 3.88, CI 1.71 Recommendations: He will be admitted to telemetry. We will need to get more information prior to planning for revascularization. We will need to optimize his fluid status. I will arrange an echo today to evaluate his valves and LVEF. He is volume overloaded and will be started on IV diuretics. I will  ask CT surgery to see him to review CABG as a revascularization option. He has diffuse multi-vessel CAD with sub-optimal targets. His vessels are not favorable for PCI. The PDA would be a target for stenting.  Continue statin and beta blocker. Will start ASA. His oral anticoagulation is on hold. Will resume IV heparin 6 hours post sheath removal.   ECHOCARDIOGRAM COMPLETE  Result Date: 03/13/2020    ECHOCARDIOGRAM REPORT   Patient Name:   [name] [name] [facility] Date of Exam: 03/13/2020 Medical Rec #:  [id number]           Height:       72.0 in Accession #:    [id number]          Weight:       244.9 lb Date of Birth:  1951/08/09           BSA:          2.322 m Patient Age:    69 years            BP:           142/99 mmHg Patient Gender: M                   HR:           82 bpm. Exam Location:  Inpatient Procedure: 2D Echo, Cardiac Doppler and Color Doppler Indications:    CAD  History:        Patient has prior history of Echocardiogram examinations, most                 recent 01/06/2020. Cardiomyopathy and CHF, CAD, Defibrillator,                 Obesity, Mitral Valve Disease, Arrythmias:Atrial Fibrillation;  Risk Factors:Hypertension, Dyslipidemia and Diabetes. CKD.  Sonographer:    Dustin Flock Referring Phys: [place]  1. Global hypokinesis with akinesis of the inferolateral wall; overall severe LV dysfunction; elevated LV filling pressure; mild LVH; mild LVE; severe MR; biatrial enlargement; mild RV dysfunction; mild TR with moderate pulmonary hypertension.  2. Left ventricular ejection fraction, by estimation, is <20%. The left ventricle has severely decreased function. The left ventricle has no regional wall motion abnormalities. The left ventricular internal cavity size was mildly dilated. There is mild left ventricular hypertrophy. Left ventricular diastolic function could not be evaluated. Elevated left atrial pressure.  3. Right ventricular systolic function  is mildly reduced. The right ventricular size is normal. There is moderately elevated pulmonary artery systolic pressure.  4. Left atrial size was severely dilated.  5. Right atrial size was moderately dilated.  6. The mitral valve is normal in structure. Severe mitral valve regurgitation. No evidence of mitral stenosis.  7. The aortic valve is tricuspid. Aortic valve regurgitation is not visualized. No aortic stenosis is present.  8. The inferior vena cava is dilated in size with <50% respiratory variability, suggesting right atrial pressure of 15 mmHg. FINDINGS  Left Ventricle: Left ventricular ejection fraction, by estimation, is <20%. The left ventricle has severely decreased function. The left ventricle has no regional wall motion abnormalities. The left ventricular internal cavity size was mildly dilated. There is mild left ventricular hypertrophy. Left ventricular diastolic function could not be evaluated due to atrial fibrillation. Left ventricular diastolic function could not be evaluated. Elevated left atrial pressure. Right Ventricle: The right ventricular size is normal.Right ventricular systolic function is mildly reduced. There is moderately elevated pulmonary artery systolic pressure. The tricuspid regurgitant velocity is 2.87 m/s, and with an assumed right atrial  pressure of 15 mmHg, the estimated right ventricular systolic pressure is 26.7 mmHg. Left Atrium: Left atrial size was severely dilated. Right Atrium: Right atrial size was moderately dilated. Pericardium: There is no evidence of pericardial effusion. Mitral Valve: The mitral valve is normal in structure. Normal mobility of the mitral valve leaflets. Moderate mitral annular calcification. Severe mitral valve regurgitation. No evidence of mitral valve stenosis. MV peak gradient, 10.8 mmHg. The mean mitral valve gradient is 3.5 mmHg. Tricuspid Valve: The tricuspid valve is normal in structure. Tricuspid valve regurgitation is mild . No  evidence of tricuspid stenosis. Aortic Valve: The aortic valve is tricuspid. Aortic valve regurgitation is not visualized. No aortic stenosis is present. Pulmonic Valve: The pulmonic valve was normal in structure. Pulmonic valve regurgitation is trivial. No evidence of pulmonic stenosis. Aorta: The aortic root is normal in size and structure. Venous: The inferior vena cava is dilated in size with less than 50% respiratory variability, suggesting right atrial pressure of 15 mmHg.  Additional Comments: Global hypokinesis with akinesis of the inferolateral wall; overall severe LV dysfunction; elevated LV filling pressure; mild LVH; mild LVE; severe MR; biatrial enlargement; mild RV dysfunction; mild TR with moderate pulmonary hypertension. A pacer wire is visualized.  LEFT VENTRICLE PLAX 2D LVIDd:         5.80 cm  Diastology LVIDs:         5.30 cm  LV e' lateral:   5.48 cm/s LV PW:         1.40 cm  LV E/e' lateral: 27.0 LV IVS:        1.40 cm  LV e' medial:    3.60 cm/s LVOT diam:     2.30  cm  LV E/e' medial:  41.1 LV SV:         51 LV SV Index:   22 LVOT Area:     4.15 cm  RIGHT VENTRICLE RV Basal diam:  3.40 cm RV S prime:     8.40 cm/s TAPSE (M-mode): 3.0 cm LEFT ATRIUM              Index       RIGHT ATRIUM           Index LA diam:        5.30 cm  2.28 cm/m  RA Area:     30.10 cm LA Vol (A2C):   124.0 ml 53.40 ml/m RA Volume:   108.00 ml 46.51 ml/m LA Vol (A4C):   167.0 ml 71.91 ml/m LA Biplane Vol: 152.0 ml 65.46 ml/m  AORTIC VALVE LVOT Vmax:   68.00 cm/s LVOT Vmean:  42.100 cm/s LVOT VTI:    0.122 m  AORTA Ao Root diam: 2.90 cm MITRAL VALVE                TRICUSPID VALVE MV Area (PHT): 3.53 cm     TR Peak grad:   32.9 mmHg MV Peak grad:  10.8 mmHg    TR Vmax:        287.00 cm/s MV Mean grad:  3.5 mmHg MV Vmax:       1.64 m/s     SHUNTS MV Vmean:      77.6 cm/s    Systemic VTI:  0.12 m MV Decel Time: 215 msec     Systemic Diam: 2.30 cm MV E velocity: 148.00 cm/s Kirk Ruths MD Electronically signed by  Kirk Ruths MD Signature Date/Time: 03/13/2020/1:12:00 PM    Final    VAS US DOPPLER PRE CABG  Result Date: 03/13/2020 PREOPERATIVE VASCULAR EVALUATION  Indications:  Pre-CABG. Risk Factors: Hypertension, hyperlipidemia, Diabetes. Performing Technologist: June Leap RDMS, RVT  Examination Guidelines: A complete evaluation includes B-mode imaging, spectral Doppler, color Doppler, and power Doppler as needed of all accessible portions of each vessel. Bilateral testing is considered an integral part of a complete examination. Limited examinations for reoccurring indications may be performed as noted.  Right Carotid Findings: +----------+--------+--------+--------+------------+--------+           PSV cm/sEDV cm/sStenosisDescribe    Comments +----------+--------+--------+--------+------------+--------+ CCA Prox  63      12                                   +----------+--------+--------+--------+------------+--------+ CCA Distal38      11                                   +----------+--------+--------+--------+------------+--------+ ICA Prox  60      22      1-39%   heterogenous         +----------+--------+--------+--------+------------+--------+ ICA Distal71      31                                   +----------+--------+--------+--------+------------+--------+ ECA       46      12                                   +----------+--------+--------+--------+------------+--------+  Portions of this table do not appear on this page. +----------+--------+-------+----------------+------------+           PSV cm/sEDV cmsDescribe        Arm Pressure +----------+--------+-------+----------------+------------+ Subclavian64             Multiphasic, WNL             +----------+--------+-------+----------------+------------+ +---------+--------+--+--------+-+---------+ VertebralPSV cm/s17EDV cm/s7Antegrade +---------+--------+--+--------+-+---------+ Left Carotid Findings:  +----------+--------+--------+--------+------------+--------+           PSV cm/sEDV cm/sStenosisDescribe    Comments +----------+--------+--------+--------+------------+--------+ CCA Prox  46      12                                   +----------+--------+--------+--------+------------+--------+ CCA Distal46      13                                   +----------+--------+--------+--------+------------+--------+ ICA Prox  58      26      1-39%   heterogenous         +----------+--------+--------+--------+------------+--------+ ICA Distal54      22                                   +----------+--------+--------+--------+------------+--------+ ECA       65      13                                   +----------+--------+--------+--------+------------+--------+ +----------+--------+--------+----------------+------------+ SubclavianPSV cm/sEDV cm/sDescribe        Arm Pressure +----------+--------+--------+----------------+------------+           47              Multiphasic, [id number]          +----------+--------+--------+----------------+------------+ +---------+--------+--+--------+--+---------+ VertebralPSV cm/s40EDV cm/s15Antegrade +---------+--------+--+--------+--+---------+  ABI Findings: +--------+------------------+-----+---------+----------------------------------+ Right   Rt Pressure (mmHg)IndexWaveform Comment                            +--------+------------------+-----+---------+----------------------------------+ Brachial                       triphasicnot obtained due to restricted arm +--------+------------------+-----+---------+----------------------------------+ ATA     139               1.20 biphasic                                    +--------+------------------+-----+---------+----------------------------------+ PTA     150               1.29 biphasic                                     +--------+------------------+-----+---------+----------------------------------+ +--------+------------------+-----+---------+-------+ Left    Lt Pressure (mmHg)IndexWaveform Comment +--------+------------------+-----+---------+-------+ [id number]                    triphasic        +--------+------------------+-----+---------+-------+ ATA     136               1.17 biphasic         +--------+------------------+-----+---------+-------+  PTA     139               1.20 biphasic         +--------+------------------+-----+---------+-------+  Right Doppler Findings: +--------+--------+-----+---------+----------------------------------+ Site    PressureIndexDoppler  Comments                           +--------+--------+-----+---------+----------------------------------+ Brachial             triphasicnot obtained due to restricted arm +--------+--------+-----+---------+----------------------------------+ Radial               triphasic                                   +--------+--------+-----+---------+----------------------------------+ Ulnar                triphasic                                   +--------+--------+-----+---------+----------------------------------+  Left Doppler Findings: +--------+--------+-----+---------+--------+ Site    PressureIndexDoppler  Comments +--------+--------+-----+---------+--------+ [id number]          triphasic         +--------+--------+-----+---------+--------+ Radial               triphasic         +--------+--------+-----+---------+--------+ Ulnar                triphasic         +--------+--------+-----+---------+--------+  Summary: Right Carotid: Velocities in the right ICA are consistent with a 1-39% stenosis. Left Carotid: Velocities in the left ICA are consistent with a 1-39% stenosis. Right ABI: Resting right ankle-brachial index is within normal range. No evidence of significant right lower extremity  arterial disease. Left ABI: Resting left ankle-brachial index is within normal range. No evidence of significant left lower extremity arterial disease. Right Upper Extremity: Not obtained due to restricted arm. Left Upper Extremity: Doppler waveforms remain within normal limits with left radial compression. Doppler waveforms remain within normal limits with left ulnar compression.  Electronically signed by Monica Martinez MD on 03/13/2020 at 5:11:17 PM.    Final     Cardiac Studies   Cath: 02/28/2020   Dist RCA lesion is 50% stenosed.  3rd RPL lesion is 100% stenosed.  RPDA lesion is 99% stenosed.  Prox RCA lesion is 20% stenosed.  RPAV lesion is 60% stenosed.  3rd Mrg-2 lesion is 100% stenosed.  3rd Mrg-1 lesion is 100% stenosed.  Ost Cx to Mid Cx lesion is 40% stenosed.  1st Diag-1 lesion is 10% stenosed.  1st Diag-2 lesion is 90% stenosed.  2nd Diag lesion is 99% stenosed.  Prox LAD to Mid LAD lesion is 70% stenosed.  Mid LAD to Dist LAD lesion is 60% stenosed.  1. Severe triple vessel CAD 2. The LAD wraps the apex. The LAD has a patent mid stented segment with moderate restenosis. The remainder of the mid and distal LAD has diffuse moderate to severe stenosis. There are two moderate caliber diagonal branches that arise from the mid LAD. The first diagonal branch has a patent proximal stent and then severe mid stenosis. The second diagonal branch has diffuse severe stenosis.  3. The Circumflex has moderate proximal stenosis. The large obtuse marginal that has been stented is now occluded and fills from left to right collaterals.  4. The RCA is a large dominant vessel with a large PDA and moderate caliber posterolateral artery. The PDA has a severe ostial stenosis. The posterolateral artery has severe proximal stenosis and total mid occlusion.  5. Elevated wedge pressure.  6. Cardiac output 3.88, CI 1.71   Recommendations: He will be admitted to telemetry. We will need to  get more information prior to planning for revascularization. We will need to optimize his fluid status. I will arrange an echo today to evaluate his valves and LVEF. He is volume overloaded and will be started on IV diuretics. I will ask CT surgery to see him to review CABG as a revascularization option. He has diffuse multi-vessel CAD with sub-optimal targets. His vessels are not favorable for PCI. The PDA would be a target for stenting. Continue statin and beta blocker. Will start ASA. His oral anticoagulation is on hold. Will resume IV heparin 6 hours post sheath removal.   Diagnostic Dominance: Right   Echo: 03/13/20  IMPRESSIONS   1. Global hypokinesis with akinesis of the inferolateral wall; overall  severe LV dysfunction; elevated LV filling pressure; mild LVH; mild LVE;  severe MR; biatrial enlargement; mild RV dysfunction; mild TR with  moderate pulmonary hypertension.  2. Left ventricular ejection fraction, by estimation, is <20%. The left  ventricle has severely decreased function. The left ventricle has no  regional wall motion abnormalities. The left ventricular internal cavity  size was mildly dilated. There is mild  left ventricular hypertrophy. Left ventricular diastolic function could  not be evaluated. Elevated left atrial pressure.  3. Right ventricular systolic function is mildly reduced. The right  ventricular size is normal. There is moderately elevated pulmonary artery  systolic pressure.  4. Left atrial size was severely dilated.  5. Right atrial size was moderately dilated.  6. The mitral valve is normal in structure. Severe mitral valve  regurgitation. No evidence of mitral stenosis.  7. The aortic valve is tricuspid. Aortic valve regurgitation is not  visualized. No aortic stenosis is present.  8. The inferior vena cava is dilated in size with <50% respiratory  variability, suggesting right atrial pressure of 15 mmHg.   Patient Profile     69 y.o.  male with history of CAD with prior stenting of the LAD, Diagonal and OM in an outside hospital, ischemic cardiomyopathy with ICD in place, chronic systolic CHF, MVP s/p mitral valve repair in 2004 at [facility], permanent atrial fib on chronic anticoagulation, HTN, HLD, DM, depression who was admitted to [facility] with c/o progressive weakness, fatigue, dyspnea and chest pressure. Troponin mildly elevated at 0.06. He was transferred to [facility] for cardiac cath. His cardiac catheterization shows severe three vessel CAD.   Assessment & Plan    1. Chest pain: transferred from [facility] with chest pain. Cath noted above with severe 3v disease. TCTS consulted with recommendations for CABG moved to 03/14/2020. No chest pain overnight. Continue on medical therapy.  -- remains on IV heparin, ASA, statin and BB  2. Persistent Afib/flutter: followed and recently seen by EP. Remains on amiodarone/BB therapy. Rates generally controlled. Consideration of atrial clipping wit CABG -- Eliquis held, on IV heparin.   3. MVP s/p mitral valve repair: back in 2004. Echo 1/21 mild mitral valve regurg. Echo yesterday with severe MR noted.   4. ICM with ICD: EF 25-30% (1/21). Repeat this admission with decline in to <20%. Wedge pressure was elevated on cath. Started on IV lasix 40mg  BID. Remains net +,  but reports breathing is stable. He has not been using the urinal. No daily weights. -- Will continue with diuresis today.  -- follow I&Os, daily weights -- Device followed now by Dr. Lovena Le.   5. HTN: blood pressures are borderline controlled. Added low dose amlodipine yesterday with improvement.  6. HL: on atorvastatin 40mg  daily, increased to 80mg  daily.   7. DM: Hgb A1c 11. Reported he was not consistently taking his insulin prior to admission. CM consulted for assistance.  -- SSI  8. AKI?: Cr 1.9 yesterday. BMET pending today. Suspect 2/2 to contrast with cath and diuretic.   For questions or  updates, please contact [place] Please consult [email or web address] for contact info under      Signed, Reino Bellis, NP  03/14/2020, 9:53 AM      Patient seen and examined. Agree with assessment and plan.  Surgery was postponed today due to another emergency state.  Patient is now scheduled for surgery on Monday.  Currently he remains pain-free on medical therapy.  He continues to be on heparin.  Creatinine increased yesterday to 1.9 from 1.55 on March 07, 2020.  We will decrease furosemide to 40 mg IV daily from twice daily.   BP today improved at 113/83.  He was to be on amiodarone 200 mg daily and heparin.TSH was increased at 8.1 on February 15, 2020.  Check CMP tomorrow to also assess LFTs on amiodarone.   Troy Sine, MD, [facility] 03/14/2020 11:41 AM

## 2020-03-14 NOTE — Progress Notes (Signed)
CARDIAC REHAB PHASE I   Preop education completed with pt. Pt given IS and able to demonstrate >2500. Pt instructed on importance of IS use, walks, and sternal precautions after surgery. Pt given in-the-tube sheet and Cardiac Surgery booklet. Deferred ambulation at this time due to slight chest tightness. Encouraged pt to listen to his body and not over do it this weekend. Pt with concerns over care after discharge. States his daughter works full time and his grandchildren are going back to school. Will continue to follow throughout his hospital stay.  [id number] Rufina Falco, RN BSN 03/14/2020 3:10 PM

## 2020-03-14 NOTE — Progress Notes (Signed)
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 Liraglutide Nausea And Vomiting  . Lisinopril Cough    Patient Measurements: Height: 6' (182.9 cm) Weight: 111.1 kg (244 lb 14.9 oz) IBW/kg (Calculated) : 77.6 Heparin Dosing Weight: 101.2 kg  Vital Signs: Temp: 97.9 F (36.6 C) (04/02 0838) Temp Source: Oral (04/02 0838) BP: 113/83 (04/02 0838) Pulse Rate: 64 (04/02 0838)  Labs: Recent Labs    02/14/2020 1429 02/13/2020 1429 02/27/2020 1434 03/13/20 0331 03/13/20 1149 03/14/20 0358 03/14/20 1219  HGB 12.2*   < > 12.6* 12.5*  --   --   --   HCT 36.0*  --  37.0* 37.6*  --   --   --   PLT  --   --   --  165  --   --   --   APTT  --   --   --   --  78* 126* 72*  HEPARINUNFRC  --   --   --  1.00*  --  0.77* 0.45  CREATININE  --   --   --  1.91*  --   --  2.17*   < > = values in this interval not displayed.    Estimated Creatinine Clearance: 40.8 mL/min (A) (by C-G formula based on SCr of 2.17 mg/dL (H)).   Medical History: Past Medical History:  Diagnosis Date  . Anxiety   . Arthritis   . CAD (coronary artery disease)    a.  1993 s/p MI - [employer];  b. s/p BMS to LAD '00;  c. PTCA 2nd diagonal 2010;  d. 02/18/12 Cath: moderate nonobs dzs - med rx;  e.  01/2015 Cath: LM nl, LAD 40-69m ISR, 70-28m/d, d1 90p (3.0x16 Synergy DES), D2 50-60, LCX nl, OM1 50p, 49m (2.5x12 Synergy DES), RCA nl, EF 30-35%.  . Chronic combined systolic and diastolic CHF (congestive heart failure) ([place])    a. 12/2014 Echo: EF 30-35%, Gr2 DD, mod MR, sev dil LA.  . CKD (chronic kidney disease), stage III   . Depression   . ED (erectile dysfunction)   . GERD (gastroesophageal reflux disease)   . Hyperlipidemia   . Hypertension   . Ischemic cardiomyopathy    a. s/p St. [name] (Atlas) ICD implanted in [place] 2007;  b. 12/2014 Echo: Ef 30-35%.  . MVP (mitral valve prolapse)    a. s/p MV annuloplasty at [facility]  2004.  Marland Kitchen Persistent atrial fibrillation ([place])    a. noted on ICD interrogation '10 - not previously on [place] - CHA2DS2VASc = 5.  . Type II diabetes mellitus ([place])    uncontrolled    Medications:  Medications Prior to Admission  Medication Sig Dispense Refill Last Dose  . ALPRAZolam (XANAX) 0.25 MG tablet Take 0.25 mg by mouth 3 (three) times daily as needed for anxiety.    unknown  . amiodarone (PACERONE) 200 MG tablet Take 200 mg by mouth daily.    03/11/2020 at Unknown time  . apixaban (ELIQUIS) 5 MG TABS tablet Take 1 tablet (5 mg total) by mouth 2 (two) times daily. 60 tablet 1 03/11/2020 at 0600  . atorvastatin (LIPITOR) 40 MG tablet Take 1 tablet (40 mg total) by mouth daily at 6 PM. 30 tablet 0 03/10/2020 at Unknown time  . citalopram (CELEXA) 20 MG tablet Take 20 mg by mouth daily.    03/11/2020 at Unknown time  . furosemide (LASIX) 40  MG tablet Take 40 mg by mouth daily.    03/11/2020 at Unknown time  . insulin aspart (NOVOLOG FLEXPEN) 100 UNIT/ML FlexPen Inject 6 Units into the skin 3 (three) times daily with meals. 15 mL 0 03/11/2020 at Unknown time  . Insulin Glargine (BASAGLAR KWIKPEN) 100 UNIT/ML SOPN Inject 0.5 mLs (50 Units total) into the skin daily. 15 mL 1 03/11/2020 at Unknown time  . metoprolol succinate (TOPROL-XL) 100 MG 24 hr tablet Take 1 tablet (100 mg total) by mouth daily. 90 tablet 3 03/11/2020 at 0600  . nitroGLYCERIN (NITROSTAT) 0.4 MG SL tablet Place 1 tablet (0.4 mg total) under the tongue every 5 (five) minutes x 3 doses as needed for chest pain. 10 tablet 0 unknown  . pantoprazole (PROTONIX) 40 MG tablet Take 1 tablet (40 mg total) by mouth daily. 30 tablet 1 03/11/2020 at Unknown time  . potassium chloride SA (KLOR-CON) 20 MEQ tablet Take 20 mEq by mouth daily.    03/11/2020 at Unknown time  . tamsulosin (FLOMAX) 0.4 MG CAPS capsule Take 1 capsule (0.4 mg total) by mouth daily. 30 capsule 0 03/11/2020 at Unknown time  . ALPRAZolam (XANAX) 1 MG tablet Take 1 tablet (1 mg  total) by mouth 2 (two) times daily as needed for anxiety. 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 Pharmacy consulted dose IV heparin  -aPTT= 72 and at goal   Goal of Therapy:  Heparin level 0.3-0.7 units/ml Monitor platelets by anticoagulation protocol: Yes   Plan:  -No heparin changes needed -Recheck aPTT later today -Daily heparin level, aPTT and CBC in am  Hildred Laser, PharmD Clinical Pharmacist **Pharmacist phone directory can now be found on [email or web address] ([email or web address]).  Listed under [place].

## 2020-03-14 NOTE — Progress Notes (Signed)
[place] for heparin Indication: 3v disease; CABG eval   Allergies  Allergen Reactions  . Liraglutide Nausea And Vomiting  . Lisinopril Cough    Patient Measurements: Height: 6' (182.9 cm) Weight: 111.1 kg (244 lb 14.9 oz) IBW/kg (Calculated) : 77.6 Heparin Dosing Weight: 101.2 kg  Vital Signs: Temp: 97.6 F (36.4 C) (04/02 1615) Temp Source: Axillary (04/02 1615) BP: 109/76 (04/02 1615) Pulse Rate: 101 (04/02 1615)  Labs: Recent Labs    02/27/2020 1429 02/23/2020 1429 02/15/2020 1434 03/13/20 0331 03/13/20 1149 03/14/20 0358 03/14/20 1219 03/14/20 1801  HGB 12.2*   < > 12.6* 12.5*  --   --   --   --   HCT 36.0*  --  37.0* 37.6*  --   --   --   --   PLT  --   --   --  165  --   --   --   --   APTT  --   --   --   --    < > 126* 72* 71*  HEPARINUNFRC  --   --   --  1.00*  --  0.77* 0.45  --   CREATININE  --   --   --  1.91*  --   --  2.17*  --    < > = values in this interval not displayed.    Estimated Creatinine Clearance: 40.8 mL/min (A) (by C-G formula based on SCr of 2.17 mg/dL (H)).   Medical History: Past Medical History:  Diagnosis Date  . Anxiety   . Arthritis   . CAD (coronary artery disease)    a.  1993 s/p MI - [employer];  b. s/p BMS to LAD '00;  c. PTCA 2nd diagonal 2010;  d. 02/18/12 Cath: moderate nonobs dzs - med rx;  e.  01/2015 Cath: LM nl, LAD 40-29m ISR, 70-59m/d, d1 90p (3.0x16 Synergy DES), D2 50-60, LCX nl, OM1 50p, 12m (2.5x12 Synergy DES), RCA nl, EF 30-35%.  . Chronic combined systolic and diastolic CHF (congestive heart failure) ([place])    a. 12/2014 Echo: EF 30-35%, Gr2 DD, mod MR, sev dil LA.  . CKD (chronic kidney disease), stage III   . Depression   . ED (erectile dysfunction)   . GERD (gastroesophageal reflux disease)   . Hyperlipidemia   . Hypertension   . Ischemic cardiomyopathy    a. s/p St. [name] (Atlas) ICD implanted in [place] 2007;  b. 12/2014 Echo: Ef 30-35%.  . MVP (mitral valve  prolapse)    a. s/p MV annuloplasty at [facility] 2004.  Marland Kitchen Persistent atrial fibrillation ([place])    a. noted on ICD interrogation '10 - not previously on [place] - CHA2DS2VASc = 5.  . Type II diabetes mellitus ([place])    uncontrolled    Medications:  Medications Prior to Admission  Medication Sig Dispense Refill Last Dose  . ALPRAZolam (XANAX) 0.25 MG tablet Take 0.25 mg by mouth 3 (three) times daily as needed for anxiety.    unknown  . amiodarone (PACERONE) 200 MG tablet Take 200 mg by mouth daily.    03/11/2020 at Unknown time  . apixaban (ELIQUIS) 5 MG TABS tablet Take 1 tablet (5 mg total) by mouth 2 (two) times daily. 60 tablet 1 03/11/2020 at 0600  . atorvastatin (LIPITOR) 40 MG tablet Take 1 tablet (40 mg total) by mouth daily at 6 PM. 30 tablet 0 03/10/2020 at Unknown time  . citalopram (CELEXA)  20 MG tablet Take 20 mg by mouth daily.    03/11/2020 at Unknown time  . furosemide (LASIX) 40 MG tablet Take 40 mg by mouth daily.    03/11/2020 at Unknown time  . insulin aspart (NOVOLOG FLEXPEN) 100 UNIT/ML FlexPen Inject 6 Units into the skin 3 (three) times daily with meals. 15 mL 0 03/11/2020 at Unknown time  . Insulin Glargine (BASAGLAR KWIKPEN) 100 UNIT/ML SOPN Inject 0.5 mLs (50 Units total) into the skin daily. 15 mL 1 03/11/2020 at Unknown time  . metoprolol succinate (TOPROL-XL) 100 MG 24 hr tablet Take 1 tablet (100 mg total) by mouth daily. 90 tablet 3 03/11/2020 at 0600  . nitroGLYCERIN (NITROSTAT) 0.4 MG SL tablet Place 1 tablet (0.4 mg total) under the tongue every 5 (five) minutes x 3 doses as needed for chest pain. 10 tablet 0 unknown  . pantoprazole (PROTONIX) 40 MG tablet Take 1 tablet (40 mg total) by mouth daily. 30 tablet 1 03/11/2020 at Unknown time  . potassium chloride SA (KLOR-CON) 20 MEQ tablet Take 20 mEq by mouth daily.    03/11/2020 at Unknown time  . tamsulosin (FLOMAX) 0.4 MG CAPS capsule Take 1 capsule (0.4 mg total) by mouth daily. 30 capsule 0 03/11/2020 at Unknown time   . ALPRAZolam (XANAX) 1 MG tablet Take 1 tablet (1 mg total) by mouth 2 (two) times daily as needed for anxiety. 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 Pharmacy consulted dose IV heparin  PM aPTT= 71 and at goal   Goal of Therapy:  Heparin level 0.3-0.7 units/ml  APTT 66 - 102 secs Monitor platelets by anticoagulation protocol: Yes   Plan:  -No heparin changes needed -Recheck aPTT later today -Daily heparin level, aPTT and CBC in am  Alanda Slim, PharmD, [facility] Clinical Pharmacist Please see AMION for all Pharmacists' Contact Phone Numbers 03/14/2020, 7:14 PM

## 2020-03-14 NOTE — Plan of Care (Signed)
  RD consulted for nutrition education regarding diabetes.   Lab Results  Component Value Date   HGBA1C 11.1 (H) 02/14/2020   Spoke with pt over the phone, who was pleasant and in good spirits today. He reports previous poor control of his DM, which he attributes to difficulty affording his insulin, however, this problem has since been resolved. Pt states he has been consuming a lot of Pepsi and fried foods and has though about ways to modify his diet. Focus on discussion was on incorporating unsweetened beverages and options and healthier cooking options.  RD provided "Carbohydrate Counting for People with Diabetes" handout from the Academy of Nutrition and Dietetics; this was attached to discharge summary and pt is aware that this is how he will receive his education materials. Discussed different food groups and their effects on blood sugar, emphasizing carbohydrate-containing foods. Provided list of carbohydrates and recommended serving sizes of common foods.  Discussed importance of controlled and consistent carbohydrate intake throughout the day. Provided examples of ways to balance meals/snacks and encouraged intake of high-fiber, whole grain complex carbohydrates. Teach back method used.  Expect fair to good compliance.  Body mass index is 33.22 kg/m. Pt meets criteria for obesity, class I based on current BMI.  Current diet order is carb modified, patient is consuming approximately 100% of meals at this time. Labs and medications reviewed. No further nutrition interventions warranted at this time. RD contact information provided. If additional nutrition issues arise, please re-consult RD.  Loistine Chance, RD, LDN, [place] Registered Dietitian II Certified Diabetes Care and Education Specialist Please refer to [facility] for RD and/or RD on-call/weekend/after hours pager

## 2020-03-15 ENCOUNTER — Inpatient Hospital Stay (HOSPITAL_COMMUNITY): Payer: Medicare HMO

## 2020-03-15 DIAGNOSIS — I2510 Atherosclerotic heart disease of native coronary artery without angina pectoris: Secondary | ICD-10-CM

## 2020-03-15 DIAGNOSIS — R570 Cardiogenic shock: Secondary | ICD-10-CM | POA: Diagnosis not present

## 2020-03-15 DIAGNOSIS — I5043 Acute on chronic combined systolic (congestive) and diastolic (congestive) heart failure: Secondary | ICD-10-CM

## 2020-03-15 DIAGNOSIS — I200 Unstable angina: Secondary | ICD-10-CM | POA: Diagnosis not present

## 2020-03-15 DIAGNOSIS — I509 Heart failure, unspecified: Secondary | ICD-10-CM

## 2020-03-15 LAB — HEPARIN LEVEL (UNFRACTIONATED)
Heparin Unfractionated: 0.26 IU/mL — ABNORMAL LOW (ref 0.30–0.70)
Heparin Unfractionated: 0.41 IU/mL (ref 0.30–0.70)

## 2020-03-15 LAB — COMPREHENSIVE METABOLIC PANEL
ALT: 21 U/L (ref 0–44)
AST: 21 U/L (ref 15–41)
Albumin: 3.2 g/dL — ABNORMAL LOW (ref 3.5–5.0)
Alkaline Phosphatase: 72 U/L (ref 38–126)
Anion gap: 13 (ref 5–15)
BUN: 33 mg/dL — ABNORMAL HIGH (ref 8–23)
CO2: 24 mmol/L (ref 22–32)
Calcium: 8.9 mg/dL (ref 8.9–10.3)
Chloride: 102 mmol/L (ref 98–111)
Creatinine, Ser: 2.22 mg/dL — ABNORMAL HIGH (ref 0.61–1.24)
GFR calc Af Amer: 34 mL/min — ABNORMAL LOW (ref >60)
GFR calc non Af Amer: 29 mL/min — ABNORMAL LOW (ref >60)
Glucose, Bld: 152 mg/dL — ABNORMAL HIGH (ref 70–99)
Potassium: 3.2 mmol/L — ABNORMAL LOW (ref 3.5–5.1)
Sodium: 139 mmol/L (ref 135–145)
Total Bilirubin: 1 mg/dL (ref 0.3–1.2)
Total Protein: 5.3 g/dL — ABNORMAL LOW (ref 6.5–8.1)

## 2020-03-15 LAB — CBC
HCT: 36.3 % — ABNORMAL LOW (ref 39.0–52.0)
Hemoglobin: 12 g/dL — ABNORMAL LOW (ref 13.0–17.0)
MCH: 30.7 pg (ref 26.0–34.0)
MCHC: 33.1 g/dL (ref 30.0–36.0)
MCV: 92.8 fL (ref 80.0–100.0)
Platelets: 151 10*3/uL (ref 150–400)
RBC: 3.91 MIL/uL — ABNORMAL LOW (ref 4.22–5.81)
RDW: 13.6 % (ref 11.5–15.5)
WBC: 4.2 10*3/uL (ref 4.0–10.5)
nRBC: 0 % (ref 0.0–0.2)

## 2020-03-15 LAB — APTT: aPTT: 60 seconds — ABNORMAL HIGH (ref 24–36)

## 2020-03-15 LAB — GLUCOSE, CAPILLARY
Glucose-Capillary: 149 mg/dL — ABNORMAL HIGH (ref 70–99)
Glucose-Capillary: 248 mg/dL — ABNORMAL HIGH (ref 70–99)
Glucose-Capillary: 218 mg/dL — ABNORMAL HIGH (ref 70–99)
Glucose-Capillary: 92 mg/dL (ref 70–99)

## 2020-03-15 LAB — COOXEMETRY PANEL
Carboxyhemoglobin: 1 % (ref 0.5–1.5)
Carboxyhemoglobin: 1 % (ref 0.5–1.5)
Methemoglobin: 0.6 % (ref 0.0–1.5)
Methemoglobin: 1 % (ref 0.0–1.5)
O2 Saturation: 39.2 %
O2 Saturation: 58.2 %
Total hemoglobin: 12.3 g/dL (ref 12.0–16.0)
Total hemoglobin: 12.3 g/dL (ref 12.0–16.0)

## 2020-03-15 MED ORDER — SODIUM CHLORIDE 0.9 % IV SOLN
1.5000 g | INTRAVENOUS | Status: DC
  Filled 2020-03-15: qty 1.5

## 2020-03-15 MED ORDER — FUROSEMIDE 40 MG PO TABS
40.0000 mg | ORAL_TABLET | Freq: Every day | ORAL | Status: DC
  Administered 2020-03-15: 10:00:00 40 mg via ORAL
  Filled 2020-03-15: qty 1

## 2020-03-15 MED ORDER — CHLORHEXIDINE GLUCONATE CLOTH 2 % EX PADS
6.0000 | MEDICATED_PAD | Freq: Every day | CUTANEOUS | Status: DC
  Administered 2020-03-15 – 2020-04-27 (×36): 6 via TOPICAL

## 2020-03-15 MED ORDER — MILRINONE LACTATE IN DEXTROSE 20-5 MG/100ML-% IV SOLN
0.2500 ug/kg/min | INTRAVENOUS | Status: DC
  Administered 2020-03-15 – 2020-03-19 (×6): 0.25 ug/kg/min via INTRAVENOUS
  Filled 2020-03-15 (×2): qty 100
  Filled 2020-03-15: qty 200
  Filled 2020-03-15 (×6): qty 100

## 2020-03-15 MED ORDER — NOREPINEPHRINE 4 MG/250ML-% IV SOLN
INTRAVENOUS | Status: AC
  Filled 2020-03-15: qty 250

## 2020-03-15 MED ORDER — TRANEXAMIC ACID (OHS) PUMP PRIME SOLUTION
2.0000 mg/kg | INTRAVENOUS | Status: DC
  Filled 2020-03-15: qty 2.22

## 2020-03-15 MED ORDER — NOREPINEPHRINE 4 MG/250ML-% IV SOLN
0.0000 ug/min | INTRAVENOUS | Status: DC
  Administered 2020-03-15: 22:00:00 2 ug/min via INTRAVENOUS

## 2020-03-15 MED ORDER — MIDAZOLAM HCL 2 MG/2ML IJ SOLN
INTRAMUSCULAR | Status: AC
  Filled 2020-03-15: qty 2

## 2020-03-15 MED ORDER — INSULIN REGULAR(HUMAN) IN NACL 100-0.9 UT/100ML-% IV SOLN
INTRAVENOUS | Status: DC
  Filled 2020-03-15: qty 100

## 2020-03-15 MED ORDER — VANCOMYCIN HCL 1500 MG/300ML IV SOLN
1500.0000 mg | INTRAVENOUS | Status: DC
  Filled 2020-03-15: qty 300

## 2020-03-15 MED ORDER — TRANEXAMIC ACID 1000 MG/10ML IV SOLN
1.5000 mg/kg/h | INTRAVENOUS | Status: DC
  Filled 2020-03-15: qty 25

## 2020-03-15 MED ORDER — SODIUM CHLORIDE 0.9 % IV SOLN
750.0000 mg | INTRAVENOUS | Status: DC
  Filled 2020-03-15: qty 750

## 2020-03-15 MED ORDER — POTASSIUM CHLORIDE 2 MEQ/ML IV SOLN
80.0000 meq | INTRAVENOUS | Status: DC
  Filled 2020-03-15: qty 40

## 2020-03-15 MED ORDER — NOREPINEPHRINE 4 MG/250ML-% IV SOLN
0.0000 ug/min | INTRAVENOUS | Status: DC
  Filled 2020-03-15: qty 250

## 2020-03-15 MED ORDER — ALPRAZOLAM 0.5 MG PO TABS
1.0000 mg | ORAL_TABLET | Freq: Two times a day (BID) | ORAL | Status: DC | PRN
  Administered 2020-03-20: 1 mg via ORAL
  Filled 2020-03-15 (×2): qty 2

## 2020-03-15 MED ORDER — TRANEXAMIC ACID (OHS) BOLUS VIA INFUSION: 15.0000 mg/kg | INTRAVENOUS | Status: DC

## 2020-03-15 MED ORDER — EPINEPHRINE HCL 5 MG/250ML IV SOLN IN NS
0.0000 ug/min | INTRAVENOUS | Status: DC
  Filled 2020-03-15: qty 250

## 2020-03-15 MED ORDER — SODIUM CHLORIDE 0.9 % IV SOLN
INTRAVENOUS | Status: DC
  Filled 2020-03-15: qty 30

## 2020-03-15 MED ORDER — MANNITOL 20 % IV SOLN
Freq: Once | INTRAVENOUS | Status: DC
  Filled 2020-03-15: qty 13

## 2020-03-15 MED ORDER — DEXMEDETOMIDINE HCL IN NACL 400 MCG/100ML IV SOLN
0.1000 ug/kg/h | INTRAVENOUS | Status: DC
  Filled 2020-03-15: qty 100

## 2020-03-15 MED ORDER — FENTANYL CITRATE (PF) 100 MCG/2ML IJ SOLN
INTRAMUSCULAR | Status: AC
  Filled 2020-03-15: qty 2

## 2020-03-15 MED ORDER — SODIUM CHLORIDE 0.9% FLUSH
10.0000 mL | Freq: Two times a day (BID) | INTRAVENOUS | Status: DC
  Administered 2020-03-17 – 2020-03-22 (×4): 10 mL

## 2020-03-15 MED ORDER — PLASMA-LYTE 148 IV SOLN
INTRAVENOUS | Status: DC
  Filled 2020-03-15: qty 2.5

## 2020-03-15 MED ORDER — SODIUM CHLORIDE 0.9% FLUSH: 10.0000 mL | INTRAVENOUS | Status: DC | PRN

## 2020-03-15 MED ORDER — POTASSIUM CHLORIDE CRYS ER 20 MEQ PO TBCR
40.0000 meq | EXTENDED_RELEASE_TABLET | Freq: Once | ORAL | Status: AC
  Administered 2020-03-15: 10:00:00 40 meq via ORAL
  Filled 2020-03-15: qty 2

## 2020-03-15 MED ORDER — MILRINONE LACTATE IN DEXTROSE 20-5 MG/100ML-% IV SOLN
0.3000 ug/kg/min | INTRAVENOUS | Status: DC
  Filled 2020-03-15 (×2): qty 100

## 2020-03-15 MED ORDER — PHENYLEPHRINE HCL-NACL 20-0.9 MG/250ML-% IV SOLN
30.0000 ug/min | INTRAVENOUS | Status: DC
  Filled 2020-03-15: qty 250

## 2020-03-15 MED ORDER — NITROGLYCERIN IN D5W 200-5 MCG/ML-% IV SOLN
2.0000 ug/min | INTRAVENOUS | Status: DC
  Filled 2020-03-15: qty 250

## 2020-03-15 NOTE — Progress Notes (Signed)
[place] for heparin Indication: 3v disease; CABG eval   Allergies  Allergen Reactions  . Liraglutide Nausea And Vomiting  . Lisinopril Cough    Patient Measurements: Height: 6' (182.9 cm) Weight: 111.1 kg (244 lb 14.9 oz) IBW/kg (Calculated) : 77.6 Heparin Dosing Weight: 101.2 kg  Vital Signs: Temp: 97.6 F (36.4 C) (04/02 2036) Temp Source: Axillary (04/02 2036) BP: 97/78 (04/02 2036) Pulse Rate: 66 (04/02 2036)  Labs: Recent Labs    03/09/2020 1434 02/25/2020 1434 03/13/20 0331 03/13/20 1149 03/14/20 0358 03/14/20 1219 03/14/20 1801 03/15/20 0311  HGB 12.6*   < > 12.5*  --   --   --   --  12.0*  HCT 37.0*  --  37.6*  --   --   --   --  36.3*  PLT  --   --  165  --   --   --   --  151  APTT  --   --   --    < > 126* 72* 71*  --   HEPARINUNFRC  --    < > 1.00*  --  0.77* 0.45  --  0.26*  CREATININE  --   --  1.91*  --   --  2.17*  --   --    < > = values in this interval not displayed.    Estimated Creatinine Clearance: 40.8 mL/min (A) (by C-G formula based on SCr of 2.17 mg/dL (H)).   Medical History: Past Medical History:  Diagnosis Date  . Anxiety   . Arthritis   . CAD (coronary artery disease)    a.  1993 s/p MI - [facility];  b. s/p BMS to LAD '00;  c. PTCA 2nd diagonal 2010;  d. 02/18/12 Cath: moderate nonobs dzs - med rx;  e.  01/2015 Cath: LM nl, LAD 40-58m ISR, 70-68m/d, d1 90p (3.0x16 Synergy DES), D2 50-60, LCX nl, OM1 50p, 48m (2.5x12 Synergy DES), RCA nl, EF 30-35%.  . Chronic combined systolic and diastolic CHF (congestive heart failure) ([place])    a. 12/2014 Echo: EF 30-35%, Gr2 DD, mod MR, sev dil LA.  . CKD (chronic kidney disease), stage III   . Depression   . ED (erectile dysfunction)   . GERD (gastroesophageal reflux disease)   . Hyperlipidemia   . Hypertension   . Ischemic cardiomyopathy    a. s/p St. [name] (Atlas) ICD implanted in [place] 2007;  b. 12/2014 Echo: Ef 30-35%.  . MVP (mitral valve  prolapse)    a. s/p MV annuloplasty at [facility] 2004.  Marland Kitchen Persistent atrial fibrillation ([place])    a. noted on ICD interrogation '10 - not previously on [place] - CHA2DS2VASc = 5.  . Type II diabetes mellitus ([place])    uncontrolled    Medications:  Medications Prior to Admission  Medication Sig Dispense Refill Last Dose  . ALPRAZolam (XANAX) 0.25 MG tablet Take 0.25 mg by mouth 3 (three) times daily as needed for anxiety.    unknown  . amiodarone (PACERONE) 200 MG tablet Take 200 mg by mouth daily.    03/11/2020 at Unknown time  . apixaban (ELIQUIS) 5 MG TABS tablet Take 1 tablet (5 mg total) by mouth 2 (two) times daily. 60 tablet 1 03/11/2020 at 0600  . atorvastatin (LIPITOR) 40 MG tablet Take 1 tablet (40 mg total) by mouth daily at 6 PM. 30 tablet 0 03/10/2020 at Unknown time  . citalopram (CELEXA) 20 MG tablet  Take 20 mg by mouth daily.    03/11/2020 at Unknown time  . furosemide (LASIX) 40 MG tablet Take 40 mg by mouth daily.    03/11/2020 at Unknown time  . insulin aspart (NOVOLOG FLEXPEN) 100 UNIT/ML FlexPen Inject 6 Units into the skin 3 (three) times daily with meals. 15 mL 0 03/11/2020 at Unknown time  . Insulin Glargine (BASAGLAR KWIKPEN) 100 UNIT/ML SOPN Inject 0.5 mLs (50 Units total) into the skin daily. 15 mL 1 03/11/2020 at Unknown time  . metoprolol succinate (TOPROL-XL) 100 MG 24 hr tablet Take 1 tablet (100 mg total) by mouth daily. 90 tablet 3 03/11/2020 at 0600  . nitroGLYCERIN (NITROSTAT) 0.4 MG SL tablet Place 1 tablet (0.4 mg total) under the tongue every 5 (five) minutes x 3 doses as needed for chest pain. 10 tablet 0 unknown  . pantoprazole (PROTONIX) 40 MG tablet Take 1 tablet (40 mg total) by mouth daily. 30 tablet 1 03/11/2020 at Unknown time  . potassium chloride SA (KLOR-CON) 20 MEQ tablet Take 20 mEq by mouth daily.    03/11/2020 at Unknown time  . tamsulosin (FLOMAX) 0.4 MG CAPS capsule Take 1 capsule (0.4 mg total) by mouth daily. 30 capsule 0 03/11/2020 at Unknown time   . ALPRAZolam (XANAX) 1 MG tablet Take 1 tablet (1 mg total) by mouth 2 (two) times daily as needed for anxiety. 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 Pharmacy consulted dose IV heparin   Heparin level low at 0.26. Will d/c aPTT, as heparin level appears more accurate. CBC stable. No active bleed issues reported.  Goal of Therapy:  Heparin level 0.3-0.7 units/ml Monitor platelets by anticoagulation protocol: Yes   Plan:  Increase heparin to 1100 units/hr 8hr heparin level Monitor daily heparin level and CBC, s/sx bleeding CABG planned for 4/5   Arturo Morton, PharmD, BCPS Please check AMION for all [place] contact numbers Clinical Pharmacist 03/15/2020 4:41 AM

## 2020-03-15 NOTE — CV Procedure (Signed)
   Pulmonary Artery Catheter Insertion Procedure Note [name] [name] [id number] December 22, 1950    Procedure: Insertion of Pulmonary Artery Catheter Indications: Shock - Hemodynamic Monitoring   Procedure Details Consent: Risks of procedure as well as the alternatives and risks of each were explained to the (patient/caregiver).  Consent for procedure obtained. Time Out: Verified patient identification, verified procedure, site/side was marked, verified correct patient position, special equipment/implants available, medications/allergies/relevent history reviewed, required imaging and test results available.  Performed   The right neck was prepped and draped in the routine sterile fashion and anesthetized with 1% local lidocaine. An 8 FR venous sheath was placed in the right internal jugular vein using a modified Seldinger technique and u/s guidance. A standard Swan-Ganz catheter was used for the procedure. The distal tip of the PA cath was maneuvered into the right pulmonary artery using pressure waveform guidance and the sheath was sutured in place.     Evaluation Blood flow good Complications: No apparent complications Patient did tolerate procedure well. Chest X-ray ordered to verify placement.  CXR: pending   Glori Bickers, MD  7:35 PM

## 2020-03-15 NOTE — Consult Note (Addendum)
Advanced Heart Failure Team Consult Note   Primary Physician: [name], PA PCP-Cardiologist:  Peter Martinique, MD   Referring: Dr. Orvan Seen  Reason for Consultation: Cardiogenic shock  HPI:    [name] [name] is seen today for evaluation of cardiogenic shock at the request of Dr. Orvan Seen.   [name] is a 69 yo male with history of CAD with prior stenting of the LAD, Diagonal and OM, systolic HF due to ischemic cardiomyopathy with ICD EF 30%, MVP s/p mitral valve repair in 2004 at [facility], permanent atrial fib on chronic anticoagulation, HTN, HLD, DM admitted to [facility] with c/o progressive weakness, fatigue, dyspnea and chest pressure. Troponin mildly elevated at 0.06. He was transferred to [facility] for cardiac cath. His cardiac catheterization showed severe 3v CAD with low output. Echo with EF 10-15%. Moderate RV dysfunction  Plan was for initially for CABG but developed AKI with attempts at diuresis. Case reviewed with Dr. Orvan Seen and he is brought to ICU for hemodynamic monitoring and pre-operative optimization.  At baseline NYHA III-IIIb symptoms. Very compliant with meds.   Cardiac cath 02/17/2020: 1. Severe triple vessel CAD 2. The LAD wraps the apex. The LAD has a patent mid stented segment with moderate restenosis. The remainder of the mid and distal LAD has diffuse moderate to severe stenosis. There are two moderate caliber diagonal branches that arise from the mid LAD. The first diagonal branch has a patent proximal stent and then severe mid stenosis. The second diagonal branch has diffuse severe stenosis.  3. The Circumflex has moderate proximal stenosis. The large obtuse marginal that has been stented is now occluded and fills from left to right collaterals.  4. The RCA is a large dominant vessel with a large PDA and moderate caliber posterolateral artery. The PDA has a severe ostial stenosis. The posterolateral artery has severe proximal stenosis and  total mid occlusion.  5. Elevated wedge pressure.  6. Cardiac output 3.88, CI 1.71   RHC 03/07/2020 RA 13 PA 41/23 (33) PCWP 23 Fick 3.9/1.7 PaPI 1.4 RA/PCWP  0.56     Review of Systems: [y] = yes, [ ]  = no   . General: Weight gain [ ] ; Weight loss [ ] ; Anorexia [ ] ; Fatigue [ y]; Fever [ ] ; Chills [ ] ; Weakness [ ]   . Cardiac: Chest pain/pressure Blue.Reese ]; Resting SOB [ ] ; Exertional SOB Blue.Reese ]; Orthopnea [ ] ; Pedal Edema Blue.Reese ]; Palpitations [ ] ; Syncope [ ] ; Presyncope [ ] ; Paroxysmal nocturnal dyspnea[ ]   . Pulmonary: Cough [ ] ; Wheezing[ ] ; Hemoptysis[ ] ; Sputum [ ] ; Snoring [ ]   . GI: Vomiting[ ] ; Dysphagia[ ] ; Melena[ ] ; Hematochezia [ ] ; Heartburn[ ] ; Abdominal pain [ ] ; Constipation [ ] ; Diarrhea [ ] ; BRBPR [ ]   . GU: Hematuria[ ] ; Dysuria [ ] ; Nocturia[ ]   . Vascular: Pain in legs with walking [ ] ; Pain in feet with lying flat [ ] ; Non-healing sores [ ] ; Stroke [ ] ; TIA [ ] ; Slurred speech [ ] ;  . Neuro: Headaches[ ] ; Vertigo[ ] ; Seizures[ ] ; Paresthesias[ ] ;Blurred vision [ ] ; Diplopia [ ] ; Vision changes [ ]   . Ortho/Skin: Arthritis Blue.Reese ]; Joint pain Blue.Reese ]; Muscle pain [ ] ; Joint swelling [ ] ; Back Pain [ ] ; Rash [ ]   . Psych: Depression[ ] ; Anxiety[ ]   . Heme: Bleeding problems [ ] ; Clotting disorders [ ] ; Anemia [ ]   . Endocrine: Diabetes [ y]; Thyroid dysfunction[ ]   Home Medications Prior to Admission  medications   Medication Sig Start Date End Date Taking? Authorizing Provider  ALPRAZolam (XANAX) 0.25 MG tablet Take 0.25 mg by mouth 3 (three) times daily as needed for anxiety.  02/07/20  Yes [provider]  amiodarone (PACERONE) 200 MG tablet Take 200 mg by mouth daily.  02/18/20  Yes [provider]  apixaban (ELIQUIS) 5 MG TABS tablet Take 1 tablet (5 mg total) by mouth 2 (two) times daily. 01/08/20  Yes Eugenie Filler, MD  atorvastatin (LIPITOR) 40 MG tablet Take 1 tablet (40 mg total) by mouth daily at 6 PM. 01/08/20  Yes Eugenie Filler, MD  citalopram  (CELEXA) 20 MG tablet Take 20 mg by mouth daily.  02/07/20  Yes [provider]  furosemide (LASIX) 40 MG tablet Take 40 mg by mouth daily.  02/01/20  Yes [provider]  insulin aspart (NOVOLOG FLEXPEN) 100 UNIT/ML FlexPen Inject 6 Units into the skin 3 (three) times daily with meals. 01/08/20  Yes Eugenie Filler, MD  Insulin Glargine (BASAGLAR KWIKPEN) 100 UNIT/ML SOPN Inject 0.5 mLs (50 Units total) into the skin daily. 01/08/20  Yes Eugenie Filler, MD  metoprolol succinate (TOPROL-XL) 100 MG 24 hr tablet Take 1 tablet (100 mg total) by mouth daily. 03/07/20  Yes Revankar, Reita Cliche, MD  nitroGLYCERIN (NITROSTAT) 0.4 MG SL tablet Place 1 tablet (0.4 mg total) under the tongue every 5 (five) minutes x 3 doses as needed for chest pain. 01/08/20  Yes Eugenie Filler, MD  pantoprazole (PROTONIX) 40 MG tablet Take 1 tablet (40 mg total) by mouth daily. 01/09/20  Yes Eugenie Filler, MD  potassium chloride SA (KLOR-CON) 20 MEQ tablet Take 20 mEq by mouth daily.  02/01/20  Yes [provider]  tamsulosin (FLOMAX) 0.4 MG CAPS capsule Take 1 capsule (0.4 mg total) by mouth daily. 01/08/20  Yes Eugenie Filler, MD  ALPRAZolam Duanne Moron) 1 MG tablet Take 1 tablet (1 mg total) by mouth 2 (two) times daily as needed for anxiety. Patient not taking: Reported on 03/09/2020 01/08/20   Eugenie Filler, MD    Past Medical History: Past Medical History:  Diagnosis Date  . Anxiety   . Arthritis   . CAD (coronary artery disease)    a.  1993 s/p MI - [facility];  b. s/p BMS to LAD '00;  c. PTCA 2nd diagonal 2010;  d. 02/18/12 Cath: moderate nonobs dzs - med rx;  e.  01/2015 Cath: LM nl, LAD 40-56m ISR, 70-47m/d, d1 90p (3.0x16 Synergy DES), D2 50-60, LCX nl, OM1 50p, 62m (2.5x12 Synergy DES), RCA nl, EF 30-35%.  . Chronic combined systolic and diastolic CHF (congestive heart failure) ([place])    a. 12/2014 Echo: EF 30-35%, Gr2 DD, mod MR, sev dil LA.  . CKD (chronic kidney disease),  stage III   . Depression   . ED (erectile dysfunction)   . GERD (gastroesophageal reflux disease)   . Hyperlipidemia   . Hypertension   . Ischemic cardiomyopathy    a. s/p St. [name] (Atlas) ICD implanted in [place] 2007;  b. 12/2014 Echo: Ef 30-35%.  . MVP (mitral valve prolapse)    a. s/p MV annuloplasty at [facility] 2004.  Marland Kitchen Persistent atrial fibrillation ([place])    a. noted on ICD interrogation '10 - not previously on [place] - CHA2DS2VASc = 5.  . Type II diabetes mellitus ([place])    uncontrolled    Past Surgical History: Past Surgical History:  Procedure Laterality Date  .  CARDIAC DEFIBRILLATOR PLACEMENT  2007   implanted in [place], has a 7001 RV lead and a SJM Atlas ICD followed by Dr Caryl Comes  . CARDIOVERSION N/A 09/29/2016   Procedure: CARDIOVERSION;  Surgeon: Lelon Perla, MD;  Location: [facility] ENDOSCOPY;  Service: Cardiovascular;  Laterality: N/A;  . CARDIOVERSION N/A 12/03/2016   Procedure: CARDIOVERSION;  Surgeon: Dorothy Spark, MD;  Location: [place];  Service: Cardiovascular;  Laterality: N/A;  . IMPLANTABLE CARDIOVERTER DEFIBRILLATOR (ICD) GENERATOR CHANGE N/A 03/19/2013   SJM Fortify ST DR generator placed by Dr Lovena Le, part of Analyze ST study  . LEFT AND RIGHT HEART CATHETERIZATION WITH CORONARY ANGIOGRAM N/A 01/14/2015   Procedure: LEFT AND RIGHT HEART CATHETERIZATION WITH CORONARY ANGIOGRAM;  Surgeon: Peter M Martinique, MD;  Location: [facility] CATH LAB;  Service: Cardiovascular;  Laterality: N/A;  . LEFT HEART CATHETERIZATION WITH CORONARY ANGIOGRAM N/A 02/17/2012   Procedure: LEFT HEART CATHETERIZATION WITH CORONARY ANGIOGRAM;  Surgeon: Jolaine Artist, MD;  Location: [facility] CATH LAB;  Service: Cardiovascular;  Laterality: N/A;  . MITRAL VALVE ANNULOPLASTY  2004   Archie Endo 02/17/2012  . RIGHT/LEFT HEART CATH AND CORONARY ANGIOGRAPHY N/A 02/12/2020   Procedure: RIGHT/LEFT HEART CATH AND CORONARY ANGIOGRAPHY;  Surgeon: Burnell Blanks, MD;  Location: [place] CV LAB;   Service: Cardiovascular;  Laterality: N/A;  . TEE WITHOUT CARDIOVERSION N/A 09/29/2016   Procedure: TRANSESOPHAGEAL ECHOCARDIOGRAM (TEE);  Surgeon: Lelon Perla, MD;  Location: [facility] ENDOSCOPY;  Service: Cardiovascular;  Laterality: N/A;  . TEE WITHOUT CARDIOVERSION N/A 12/03/2016   Procedure: TRANSESOPHAGEAL ECHOCARDIOGRAM (TEE);  Surgeon: Dorothy Spark, MD;  Location: [facility] ENDOSCOPY;  Service: Cardiovascular;  Laterality: N/A;    Family History: Family History  Problem Relation Age of Onset  . Coronary artery disease Mother   . Colon cancer Mother   . Heart attack Mother   . Heart disease Mother   . Heart failure Mother   . Hypertension Mother   . Malignant hyperthermia Mother   . Colon cancer Sister   . Coronary artery disease Brother   . Heart disease Brother     Social History: Social History   Socioeconomic History  . Marital status: Married    Spouse name: Not on file  . Number of children: 8  . Years of education: Not on file  . Highest education level: Not on file  Occupational History  . Occupation: Community education officer    Comment: disabled  Tobacco Use  . Smoking status: Never Smoker  . Smokeless tobacco: Never Used  Substance and Sexual Activity  . Alcohol use: Yes    Alcohol/week: 0.0 standard drinks    Comment: 01/14/2015 "used to drink alot; stopped completely in 1983"  . Drug use: No    Comment: 01/14/2015 "used to use about anything I could get; stopped completely in 1983"  . Sexual activity: Not Currently  Other Topics Concern  . Not on file  Social History Narrative  . Not on file   Social Determinants of Health   Financial Resource Strain:   . Difficulty of Paying Living Expenses:   Food Insecurity:   . Worried About Charity fundraiser in the Last Year:   . Arboriculturist in the Last Year:   Transportation Needs:   . Film/video editor (Medical):   Marland Kitchen Lack of Transportation (Non-Medical):   Physical Activity:   . Days of Exercise per Week:   .  Minutes of Exercise per Session:   Stress:   . Feeling of  Stress :   Social Connections:   . Frequency of Communication with Friends and Family:   . Frequency of Social Gatherings with Friends and Family:   . Attends Religious Services:   . Active Member of Clubs or Organizations:   . Attends Archivist Meetings:   Marland Kitchen Marital Status:     Allergies:  Allergies  Allergen Reactions  . Liraglutide Nausea And Vomiting  . Lisinopril Cough    Objective:    Vital Signs:   Temp:  [97.6 F (36.4 C)-98.4 F (36.9 C)] 98 F (36.7 C) (04/03 1622) Pulse Rate:  [25-99] 25 (04/03 1622) Resp:  [15-20] 15 (04/03 1622) BP: (97-115)/(70-88) 109/88 (04/03 1622) SpO2:  [91 %-100 %] 96 % (04/03 1622) Last BM Date: 03/13/20  Weight change: Filed Weights   02/19/2020 1801  Weight: 111.1 kg    Intake/Output:   Intake/Output Summary (Last 24 hours) at 03/15/2020 1824 Last data filed at 03/15/2020 1225 Gross per 24 hour  Intake 762 ml  Output 325 ml  Net 437 ml      Physical Exam    General:  Fatigued appearing. No resp difficulty HEENT: normal x for poor dentition Neck: supple. JVP to jaw. Carotids 2+ bilat; no bruits. No lymphadenopathy or thyromegaly appreciated. Cor: PMI nondisplaced. Regular rate & rhythm. No rubs, gallops or murmurs. Lungs: clear Abdomen: soft, nontender, nondistended. No hepatosplenomegaly. No bruits or masses. Good bowel sounds. Extremities: no cyanosis, clubbing, rash, edema Neuro: alert & orientedx3, cranial nerves grossly intact. moves all 4 extremities w/o difficulty. Affect pleasant   Telemetry   AF 80-90s Personally reviewed  EKG    AF 84  IVCDPersonally reviewed   Labs   Basic Metabolic Panel: Recent Labs  Lab 02/13/2020 1429 02/12/2020 1434 03/13/20 0331 03/14/20 1219 03/15/20 0311  NA 140 140 139 137 139  K 3.3* 3.3* 3.7 3.6 3.2*  CL  --   --  103 100 102  CO2  --   --  26 24 24   GLUCOSE  --   --  188* 191* 152*  BUN  --   --   25* 31* 33*  CREATININE  --   --  1.91* 2.17* 2.22*  CALCIUM  --   --  8.8* 8.6* 8.9    Liver Function Tests: Recent Labs  Lab 03/15/20 0311  AST 21  ALT 21  ALKPHOS 72  BILITOT 1.0  PROT 5.3*  ALBUMIN 3.2*   No results for input(s): LIPASE, AMYLASE in the last 168 hours. No results for input(s): AMMONIA in the last 168 hours.  CBC: Recent Labs  Lab 03/02/2020 1429 02/24/2020 1434 03/13/20 0331 03/15/20 0311  WBC  --   --  5.1 4.2  HGB 12.2* 12.6* 12.5* 12.0*  HCT 36.0* 37.0* 37.6* 36.3*  MCV  --   --  92.2 92.8  PLT  --   --  165 151    Cardiac Enzymes: No results for input(s): CKTOTAL, CKMB, CKMBINDEX, TROPONINI in the last 168 hours.  BNP: BNP (last 3 results) Recent Labs    01/04/20 1642  BNP 165.2*    ProBNP (last 3 results) No results for input(s): PROBNP in the last 8760 hours.   CBG: Recent Labs  Lab 03/14/20 1612 03/14/20 2127 03/15/20 0617 03/15/20 1219 03/15/20 1619  GLUCAP 148* 145* 149* 248* 218*    Coagulation Studies: No results for input(s): LABPROT, INR in the last 72 hours.   Imaging    No results found.  Medications:     Current Medications: . amiodarone  200 mg Oral Daily  . amLODipine  5 mg Oral Daily  . aspirin  81 mg Oral Daily  . atorvastatin  80 mg Oral q1800  . citalopram  20 mg Oral Daily  . [START ON 03/25/2020] epinephrine  0-10 mcg/min Intravenous To OR  . furosemide  40 mg Oral Daily  . [START ON 04/11/2020] heparin-papaverine-plasmalyte irrigation   Irrigation To OR  . influenza vaccine adjuvanted  0.5 mL Intramuscular Tomorrow-1000  . insulin aspart  0-20 Units Subcutaneous TID WC  . insulin aspart  0-5 Units Subcutaneous QHS  . [START ON 03/26/2020] insulin   Intravenous To OR  . [START ON 03/21/2020] [place] Blood Cardioplegia vial (lidocaine/magnesium/mannitol 0.26g-4g-6.4g)   Intracoronary Once  . metoprolol succinate  100 mg Oral Daily  . pantoprazole  40 mg Oral Daily  . [START ON 04/04/2020]  phenylephrine  30-200 mcg/min Intravenous To OR  . [START ON 03/28/2020] potassium chloride  80 mEq Other To OR  . sodium chloride flush  3 mL Intravenous Q12H  . tamsulosin  0.4 mg Oral Daily  . [START ON 03/25/2020] tranexamic acid  15 mg/kg Intravenous To OR  . [START ON 04/09/2020] tranexamic acid  2 mg/kg Intracatheter To OR     Infusions: . sodium chloride    . sodium chloride 10 mL/hr at 02/23/2020 2300  . [START ON 03/31/2020] cefUROXime (ZINACEF)  IV    . [START ON 04/02/2020] cefUROXime (ZINACEF)  IV    . [START ON 04/07/2020] dexmedetomidine    . [START ON 03/28/2020] heparin 30,000 units/NS 1000 mL solution for CELLSAVER    . heparin 1,100 Units/hr (03/15/20 1621)  . [START ON 03/22/2020] milrinone    . [START ON 04/07/2020] nitroGLYCERIN    . [START ON 04/07/2020] norepinephrine    . [START ON 03/25/2020] tranexamic acid (CYKLOKAPRON) infusion ([facility])    . [START ON 03/28/2020] vancomycin        Assessment/Plan   1. Acute on chronic systolic HF -> cardiogenic shock  - Echo 2016 EF 30-35% -  Echo 1/21 EF 25% - Echo 4/21 EF 10-15% moderate RV dysfunction - cath with severe 3v CAD and low output with CI 1.7  - now with AKI with attempts at diuresis.  - hold b-blocker - Move to ICU for hemodynamic monitoring and optimization - Initially plan for CABG but given need for re-do sternotomy, relatively poor targets and longstanding low EF suspect VAD may be better option if RV can tolerate - Will need teeth extraction prior to VAD (has 4 teeth left)  2. CAD with unstable angina - s/p previous PCI - cath 02/11/2020 with severe 3v CAD - plan as above - continue ASA/statin  3. AKI on CKD 3a - baseline creatinine 1.3-1.5 - up to 2.2. suspect cardio renal  4. Permanent AF - rate controlled. On Eliquis at home - Continue heparin  5. MVP s/p MV repair - stable on echo   6. DM2, poorly controlled - hgbA1c 11.1%   CRITICAL CARE Performed by: Glori Bickers  Total critical care time: 45  minutes  Critical care time was exclusive of separately billable procedures and treating other patients.  Critical care was necessary to treat or prevent imminent or life-threatening deterioration.  Critical care was time spent personally by me (independent of midlevel providers or residents) on the following activities: development of treatment plan with patient and/or surrogate as well as nursing, discussions with consultants, evaluation of patient's  response to treatment, examination of patient, obtaining history from patient or surrogate, ordering and performing treatments and interventions, ordering and review of laboratory studies, ordering and review of radiographic studies, pulse oximetry and re-evaluation of patient's condition.    Length of Stay: 3  Glori Bickers, MD  03/15/2020, 6:24 PM  Advanced Heart Failure Team Pager [phone] (M-F; 7a - 4p)  Please contact [facility] for night-coverage after hours (4p -7a ) and weekends on [email or web address]

## 2020-03-15 NOTE — Progress Notes (Signed)
[place] for heparin Indication: 3v disease; CABG eval  and persistent Afib/flutter  Allergies  Allergen Reactions  . Liraglutide Nausea And Vomiting  . Lisinopril Cough    Patient Measurements: Height: 6' (182.9 cm) Weight: 111.1 kg (244 lb 14.9 oz) IBW/kg (Calculated) : 77.6 Heparin Dosing Weight: 101.2 kg  Vital Signs: Temp: 98 F (36.7 C) (04/03 1224) Temp Source: Oral (04/03 1224) BP: 106/84 (04/03 1224) Pulse Rate: 99 (04/03 1224)  Labs: Recent Labs    02/29/2020 1434 02/23/2020 1434 03/13/20 0331 03/13/20 1149 03/14/20 1219 03/14/20 1801 03/15/20 0311 03/15/20 1313  HGB 12.6*   < > 12.5*  --   --   --  12.0*  --   HCT 37.0*  --  37.6*  --   --   --  36.3*  --   PLT  --   --  165  --   --   --  151  --   APTT  --   --   --    < > 72* 71* 60*  --   HEPARINUNFRC  --   --  1.00*   < > 0.45  --  0.26* 0.41  CREATININE  --   --  1.91*  --  2.17*  --  2.22*  --    < > = values in this interval not displayed.    Estimated Creatinine Clearance: 39.9 mL/min (A) (by C-G formula based on SCr of 2.22 mg/dL (H)).   Medical History: Past Medical History:  Diagnosis Date  . Anxiety   . Arthritis   . CAD (coronary artery disease)    a.  1993 s/p MI - [employer];  b. s/p BMS to LAD '00;  c. PTCA 2nd diagonal 2010;  d. 02/18/12 Cath: moderate nonobs dzs - med rx;  e.  01/2015 Cath: LM nl, LAD 40-74m ISR, 70-63m/d, d1 90p (3.0x16 Synergy DES), D2 50-60, LCX nl, OM1 50p, 66m (2.5x12 Synergy DES), RCA nl, EF 30-35%.  . Chronic combined systolic and diastolic CHF (congestive heart failure) ([place])    a. 12/2014 Echo: EF 30-35%, Gr2 DD, mod MR, sev dil LA.  . CKD (chronic kidney disease), stage III   . Depression   . ED (erectile dysfunction)   . GERD (gastroesophageal reflux disease)   . Hyperlipidemia   . Hypertension   . Ischemic cardiomyopathy    a. s/p St. [name] (Atlas) ICD implanted in [place] 2007;  b. 12/2014 Echo: Ef 30-35%.  .  MVP (mitral valve prolapse)    a. s/p MV annuloplasty at [facility] 2004.  Marland Kitchen Persistent atrial fibrillation ([place])    a. noted on ICD interrogation '10 - not previously on [place] - CHA2DS2VASc = 5.  . Type II diabetes mellitus ([place])    uncontrolled    Medications:  Medications Prior to Admission  Medication Sig Dispense Refill Last Dose  . ALPRAZolam (XANAX) 0.25 MG tablet Take 0.25 mg by mouth 3 (three) times daily as needed for anxiety.    unknown  . amiodarone (PACERONE) 200 MG tablet Take 200 mg by mouth daily.    03/11/2020 at Unknown time  . apixaban (ELIQUIS) 5 MG TABS tablet Take 1 tablet (5 mg total) by mouth 2 (two) times daily. 60 tablet 1 03/11/2020 at 0600  . atorvastatin (LIPITOR) 40 MG tablet Take 1 tablet (40 mg total) by mouth daily at 6 PM. 30 tablet 0 03/10/2020 at Unknown time  . citalopram (CELEXA) 20 MG  tablet Take 20 mg by mouth daily.    03/11/2020 at Unknown time  . furosemide (LASIX) 40 MG tablet Take 40 mg by mouth daily.    03/11/2020 at Unknown time  . insulin aspart (NOVOLOG FLEXPEN) 100 UNIT/ML FlexPen Inject 6 Units into the skin 3 (three) times daily with meals. 15 mL 0 03/11/2020 at Unknown time  . Insulin Glargine (BASAGLAR KWIKPEN) 100 UNIT/ML SOPN Inject 0.5 mLs (50 Units total) into the skin daily. 15 mL 1 03/11/2020 at Unknown time  . metoprolol succinate (TOPROL-XL) 100 MG 24 hr tablet Take 1 tablet (100 mg total) by mouth daily. 90 tablet 3 03/11/2020 at 0600  . nitroGLYCERIN (NITROSTAT) 0.4 MG SL tablet Place 1 tablet (0.4 mg total) under the tongue every 5 (five) minutes x 3 doses as needed for chest pain. 10 tablet 0 unknown  . pantoprazole (PROTONIX) 40 MG tablet Take 1 tablet (40 mg total) by mouth daily. 30 tablet 1 03/11/2020 at Unknown time  . potassium chloride SA (KLOR-CON) 20 MEQ tablet Take 20 mEq by mouth daily.    03/11/2020 at Unknown time  . tamsulosin (FLOMAX) 0.4 MG CAPS capsule Take 1 capsule (0.4 mg total) by mouth daily. 30 capsule 0 03/11/2020  at Unknown time  . ALPRAZolam (XANAX) 1 MG tablet Take 1 tablet (1 mg total) by mouth 2 (two) times daily as needed for anxiety. (Patient not taking: Reported on 03/05/2020) 10 tablet 0 Not Taking at Unknown time    Assessment: 16 YOM with atrial fibrillation on Eliquis PTA s/p R & L heart cath found to have 3v disease now being evaluated for CABG which is now planned for Monday 4/5.   Heparin level therapeutic at 0.41 after increasing dose to 1100 units/hr. CBC stable, no overt bleeding noted.  Goal of Therapy:  Heparin level 0.3-0.7 units/ml Monitor platelets by anticoagulation protocol: Yes   Plan:  Continue heparin gtt at 1100 units/hr Monitor daily heparin level and CBC, s/sx bleeding CABG planned for 4/5  Richardine Service, PharmD PGY1 Pharmacy Resident Phone: [phone] 03/15/2020  2:08 PM  Please check [email or web address] for unit-specific pharmacy phone numbers.

## 2020-03-15 NOTE — Progress Notes (Addendum)
Progress Note  Patient Name: [name] [name] Date of Encounter: 03/15/2020  Primary Cardiologist: Peter Martinique, MD   Subjective   Pt resting well almost flat, had transient SOB this morning during vitals while sitting on the side of the bed. Reports chest tightness 2-3 times daily, but does not warrant asking for nitro - this is improved from prior to this hospitalization.  Inpatient Medications    Scheduled Meds: . amiodarone  200 mg Oral Daily  . amLODipine  5 mg Oral Daily  . aspirin  81 mg Oral Daily  . atorvastatin  80 mg Oral q1800  . citalopram  20 mg Oral Daily  . furosemide  40 mg Intravenous Daily  . influenza vaccine adjuvanted  0.5 mL Intramuscular Tomorrow-1000  . insulin aspart  0-20 Units Subcutaneous TID WC  . insulin aspart  0-5 Units Subcutaneous QHS  . metoprolol succinate  100 mg Oral Daily  . pantoprazole  40 mg Oral Daily  . sodium chloride flush  3 mL Intravenous Q12H  . tamsulosin  0.4 mg Oral Daily   Continuous Infusions: . sodium chloride    . sodium chloride 10 mL/hr at 02/23/2020 2300  . heparin 1,100 Units/hr (03/15/20 0528)   PRN Meds: sodium chloride, sodium chloride, acetaminophen, ALPRAZolam, nitroGLYCERIN, ondansetron (ZOFRAN) IV, sodium chloride flush   Vital Signs    Vitals:   03/14/20 0838 03/14/20 1615 03/14/20 2036 03/15/20 0511  BP: 113/83 109/76 97/78 113/70  Pulse: 64 (!) 101 66 67  Resp: 18 10 20 17   Temp: 97.9 F (36.6 C) 97.6 F (36.4 C) 97.6 F (36.4 C) 97.6 F (36.4 C)  TempSrc: Oral Axillary Axillary Axillary  SpO2: 96% 98% 100% 96%  Weight:      Height:        Intake/Output Summary (Last 24 hours) at 03/15/2020 0835 Last data filed at 03/14/2020 2200 Gross per 24 hour  Intake 672.29 ml  Output --  Net 672.29 ml   Last 3 Weights 02/22/2020 03/07/2020 02/08/2020  Weight (lbs) 244 lb 14.9 oz 237 lb 254 lb  Weight (kg) 111.1 kg 107.502 kg 115.214 kg      Telemetry    Afib/flutter with ventricular rates  60-70s - Personally Reviewed  ECG    No new tracings - Personally Reviewed  Physical Exam   GEN: No acute distress.   Neck: No JVD Cardiac: irregular rhythm, regular rate, + murmur Respiratory: respirations unlabored, crackles in left base GI: Soft, nontender, non-distended  MS: No edema; No deformity. Neuro:  Nonfocal  Psych: Normal affect   Labs    High Sensitivity Troponin:  No results for input(s): TROPONINIHS in the last 720 hours.    Chemistry Recent Labs  Lab 03/13/20 0331 03/14/20 1219 03/15/20 0311  NA 139 137 139  K 3.7 3.6 3.2*  CL 103 100 102  CO2 26 24 24   GLUCOSE 188* 191* 152*  BUN 25* 31* 33*  CREATININE 1.91* 2.17* 2.22*  CALCIUM 8.8* 8.6* 8.9  PROT  --   --  5.3*  ALBUMIN  --   --  3.2*  AST  --   --  21  ALT  --   --  21  ALKPHOS  --   --  72  BILITOT  --   --  1.0  GFRNONAA 35* 30* 29*  GFRAA 40* 34* 34*  ANIONGAP 10 13 13      Hematology Recent Labs  Lab 02/22/2020 1434 03/13/20 0331 03/15/20 0311  WBC  --  5.1 4.2  RBC  --  4.08* 3.91*  HGB 12.6* 12.5* 12.0*  HCT 37.0* 37.6* 36.3*  MCV  --  92.2 92.8  MCH  --  30.6 30.7  MCHC  --  33.2 33.1  RDW  --  13.7 13.6  PLT  --  165 151    BNPNo results for input(s): BNP, PROBNP in the last 168 hours.   DDimer No results for input(s): DDIMER in the last 168 hours.   Radiology    ECHOCARDIOGRAM COMPLETE  Result Date: 03/13/2020    ECHOCARDIOGRAM REPORT   Patient Name:   GLEB MCGUIRE [facility] Date of Exam: 03/13/2020 Medical Rec #:  [id number]           Height:       72.0 in Accession #:    [id number]          Weight:       244.9 lb Date of Birth:  [birth year]           BSA:          2.322 m Patient Age:    52 years            BP:           142/99 mmHg Patient Gender: M                   HR:           82 bpm. Exam Location:  Inpatient Procedure: 2D Echo, Cardiac Doppler and Color Doppler Indications:    CAD  History:        Patient has prior history of Echocardiogram examinations, most                  recent 01/06/2020. Cardiomyopathy and CHF, CAD, Defibrillator,                 Obesity, Mitral Valve Disease, Arrythmias:Atrial Fibrillation;                 Risk Factors:Hypertension, Dyslipidemia and Diabetes. CKD.  Sonographer:    Dustin Flock Referring Phys: [place]  1. Global hypokinesis with akinesis of the inferolateral wall; overall severe LV dysfunction; elevated LV filling pressure; mild LVH; mild LVE; severe MR; biatrial enlargement; mild RV dysfunction; mild TR with moderate pulmonary hypertension.  2. Left ventricular ejection fraction, by estimation, is <20%. The left ventricle has severely decreased function. The left ventricle has no regional wall motion abnormalities. The left ventricular internal cavity size was mildly dilated. There is mild left ventricular hypertrophy. Left ventricular diastolic function could not be evaluated. Elevated left atrial pressure.  3. Right ventricular systolic function is mildly reduced. The right ventricular size is normal. There is moderately elevated pulmonary artery systolic pressure.  4. Left atrial size was severely dilated.  5. Right atrial size was moderately dilated.  6. The mitral valve is normal in structure. Severe mitral valve regurgitation. No evidence of mitral stenosis.  7. The aortic valve is tricuspid. Aortic valve regurgitation is not visualized. No aortic stenosis is present.  8. The inferior vena cava is dilated in size with <50% respiratory variability, suggesting right atrial pressure of 15 mmHg. FINDINGS  Left Ventricle: Left ventricular ejection fraction, by estimation, is <20%. The left ventricle has severely decreased function. The left ventricle has no regional wall motion abnormalities. The left ventricular internal cavity size was mildly dilated. There is mild left ventricular hypertrophy. Left ventricular diastolic function could not  be evaluated due to atrial fibrillation. Left ventricular  diastolic function could not be evaluated. Elevated left atrial pressure. Right Ventricle: The right ventricular size is normal.Right ventricular systolic function is mildly reduced. There is moderately elevated pulmonary artery systolic pressure. The tricuspid regurgitant velocity is 2.87 m/s, and with an assumed right atrial  pressure of 15 mmHg, the estimated right ventricular systolic pressure is 78.2 mmHg. Left Atrium: Left atrial size was severely dilated. Right Atrium: Right atrial size was moderately dilated. Pericardium: There is no evidence of pericardial effusion. Mitral Valve: The mitral valve is normal in structure. Normal mobility of the mitral valve leaflets. Moderate mitral annular calcification. Severe mitral valve regurgitation. No evidence of mitral valve stenosis. MV peak gradient, 10.8 mmHg. The mean mitral valve gradient is 3.5 mmHg. Tricuspid Valve: The tricuspid valve is normal in structure. Tricuspid valve regurgitation is mild . No evidence of tricuspid stenosis. Aortic Valve: The aortic valve is tricuspid. Aortic valve regurgitation is not visualized. No aortic stenosis is present. Pulmonic Valve: The pulmonic valve was normal in structure. Pulmonic valve regurgitation is trivial. No evidence of pulmonic stenosis. Aorta: The aortic root is normal in size and structure. Venous: The inferior vena cava is dilated in size with less than 50% respiratory variability, suggesting right atrial pressure of 15 mmHg.  Additional Comments: Global hypokinesis with akinesis of the inferolateral wall; overall severe LV dysfunction; elevated LV filling pressure; mild LVH; mild LVE; severe MR; biatrial enlargement; mild RV dysfunction; mild TR with moderate pulmonary hypertension. A pacer wire is visualized.  LEFT VENTRICLE PLAX 2D LVIDd:         5.80 cm  Diastology LVIDs:         5.30 cm  LV e' lateral:   5.48 cm/s LV PW:         1.40 cm  LV E/e' lateral: 27.0 LV IVS:        1.40 cm  LV e' medial:    3.60  cm/s LVOT diam:     2.30 cm  LV E/e' medial:  41.1 LV SV:         51 LV SV Index:   22 LVOT Area:     4.15 cm  RIGHT VENTRICLE RV Basal diam:  3.40 cm RV S prime:     8.40 cm/s TAPSE (M-mode): 3.0 cm LEFT ATRIUM              Index       RIGHT ATRIUM           Index LA diam:        5.30 cm  2.28 cm/m  RA Area:     30.10 cm LA Vol (A2C):   124.0 ml 53.40 ml/m RA Volume:   108.00 ml 46.51 ml/m LA Vol (A4C):   167.0 ml 71.91 ml/m LA Biplane Vol: 152.0 ml 65.46 ml/m  AORTIC VALVE LVOT Vmax:   68.00 cm/s LVOT Vmean:  42.100 cm/s LVOT VTI:    0.122 m  AORTA Ao Root diam: 2.90 cm MITRAL VALVE                TRICUSPID VALVE MV Area (PHT): 3.53 cm     TR Peak grad:   32.9 mmHg MV Peak grad:  10.8 mmHg    TR Vmax:        287.00 cm/s MV Mean grad:  3.5 mmHg MV Vmax:       1.64 m/s     SHUNTS MV Vmean:  77.6 cm/s    Systemic VTI:  0.12 m MV Decel Time: 215 msec     Systemic Diam: 2.30 cm MV E velocity: 148.00 cm/s Kirk Ruths MD Electronically signed by Kirk Ruths MD Signature Date/Time: 03/13/2020/1:12:00 PM    Final    VAS US DOPPLER PRE CABG  Result Date: 03/13/2020 PREOPERATIVE VASCULAR EVALUATION  Indications:  Pre-CABG. Risk Factors: Hypertension, hyperlipidemia, Diabetes. Performing Technologist: June Leap RDMS, RVT  Examination Guidelines: A complete evaluation includes B-mode imaging, spectral Doppler, color Doppler, and power Doppler as needed of all accessible portions of each vessel. Bilateral testing is considered an integral part of a complete examination. Limited examinations for reoccurring indications may be performed as noted.  Right Carotid Findings: +----------+--------+--------+--------+------------+--------+           PSV cm/sEDV cm/sStenosisDescribe    Comments +----------+--------+--------+--------+------------+--------+ CCA Prox  63      12                                   +----------+--------+--------+--------+------------+--------+ CCA Distal38      11                                    +----------+--------+--------+--------+------------+--------+ ICA Prox  60      22      1-39%   heterogenous         +----------+--------+--------+--------+------------+--------+ ICA Distal71      31                                   +----------+--------+--------+--------+------------+--------+ ECA       46      12                                   +----------+--------+--------+--------+------------+--------+ Portions of this table do not appear on this page. +----------+--------+-------+----------------+------------+           PSV cm/sEDV cmsDescribe        Arm Pressure +----------+--------+-------+----------------+------------+ Subclavian64             Multiphasic, WNL             +----------+--------+-------+----------------+------------+ +---------+--------+--+--------+-+---------+ VertebralPSV cm/s17EDV cm/s7Antegrade +---------+--------+--+--------+-+---------+ Left Carotid Findings: +----------+--------+--------+--------+------------+--------+           PSV cm/sEDV cm/sStenosisDescribe    Comments +----------+--------+--------+--------+------------+--------+ CCA Prox  46      12                                   +----------+--------+--------+--------+------------+--------+ CCA Distal46      13                                   +----------+--------+--------+--------+------------+--------+ ICA Prox  58      26      1-39%   heterogenous         +----------+--------+--------+--------+------------+--------+ ICA Distal54      22                                   +----------+--------+--------+--------+------------+--------+  ECA       65      13                                   +----------+--------+--------+--------+------------+--------+ +----------+--------+--------+----------------+------------+ SubclavianPSV cm/sEDV cm/sDescribe        Arm Pressure  +----------+--------+--------+----------------+------------+           47              Multiphasic, [id number]          +----------+--------+--------+----------------+------------+ +---------+--------+--+--------+--+---------+ VertebralPSV cm/s40EDV cm/s15Antegrade +---------+--------+--+--------+--+---------+  ABI Findings: +--------+------------------+-----+---------+----------------------------------+ Right   Rt Pressure (mmHg)IndexWaveform Comment                            +--------+------------------+-----+---------+----------------------------------+ Brachial                       triphasicnot obtained due to restricted arm +--------+------------------+-----+---------+----------------------------------+ ATA     139               1.20 biphasic                                    +--------+------------------+-----+---------+----------------------------------+ PTA     150               1.29 biphasic                                    +--------+------------------+-----+---------+----------------------------------+ +--------+------------------+-----+---------+-------+ Left    Lt Pressure (mmHg)IndexWaveform Comment +--------+------------------+-----+---------+-------+ [id number]                    triphasic        +--------+------------------+-----+---------+-------+ ATA     136               1.17 biphasic         +--------+------------------+-----+---------+-------+ PTA     139               1.20 biphasic         +--------+------------------+-----+---------+-------+  Right Doppler Findings: +--------+--------+-----+---------+----------------------------------+ Site    PressureIndexDoppler  Comments                           +--------+--------+-----+---------+----------------------------------+ Brachial             triphasicnot obtained due to restricted arm +--------+--------+-----+---------+----------------------------------+ Radial                triphasic                                   +--------+--------+-----+---------+----------------------------------+ Ulnar                triphasic                                   +--------+--------+-----+---------+----------------------------------+  Left Doppler Findings: +--------+--------+-----+---------+--------+ Site    PressureIndexDoppler  Comments +--------+--------+-----+---------+--------+ [id number]          triphasic         +--------+--------+-----+---------+--------+ Radial  triphasic         +--------+--------+-----+---------+--------+ Ulnar                triphasic         +--------+--------+-----+---------+--------+  Summary: Right Carotid: Velocities in the right ICA are consistent with a 1-39% stenosis. Left Carotid: Velocities in the left ICA are consistent with a 1-39% stenosis. Right ABI: Resting right ankle-brachial index is within normal range. No evidence of significant right lower extremity arterial disease. Left ABI: Resting left ankle-brachial index is within normal range. No evidence of significant left lower extremity arterial disease. Right Upper Extremity: Not obtained due to restricted arm. Left Upper Extremity: Doppler waveforms remain within normal limits with left radial compression. Doppler waveforms remain within normal limits with left ulnar compression.  Electronically signed by Monica Martinez MD on 03/13/2020 at 5:11:17 PM.    Final     Cardiac Studies   Cath: 02/25/2020   Dist RCA lesion is 50% stenosed.  3rd RPL lesion is 100% stenosed.  RPDA lesion is 99% stenosed.  Prox RCA lesion is 20% stenosed.  RPAV lesion is 60% stenosed.  3rd Mrg-2 lesion is 100% stenosed.  3rd Mrg-1 lesion is 100% stenosed.  Ost Cx to Mid Cx lesion is 40% stenosed.  1st Diag-1 lesion is 10% stenosed.  1st Diag-2 lesion is 90% stenosed.  2nd Diag lesion is 99% stenosed.  Prox LAD to Mid LAD lesion is 70%  stenosed.  Mid LAD to Dist LAD lesion is 60% stenosed.  1. Severe triple vessel CAD 2. The LAD wraps the apex. The LAD has a patent mid stented segment with moderate restenosis. The remainder of the mid and distal LAD has diffuse moderate to severe stenosis. There are two moderate caliber diagonal branches that arise from the mid LAD. The first diagonal branch has a patent proximal stent and then severe mid stenosis. The second diagonal branch has diffuse severe stenosis.  3. The Circumflex has moderate proximal stenosis. The large obtuse marginal that has been stented is now occluded and fills from left to right collaterals.  4. The RCA is a large dominant vessel with a large PDA and moderate caliber posterolateral artery. The PDA has a severe ostial stenosis. The posterolateral artery has severe proximal stenosis and total mid occlusion.  5. Elevated wedge pressure.  6. Cardiac output 3.88, CI 1.71   Recommendations: He will be admitted to telemetry. We will need to get more information prior to planning for revascularization. We will need to optimize his fluid status. I will arrange an echo today to evaluate his valves and LVEF. He is volume overloaded and will be started on IV diuretics. I will ask CT surgery to see him to review CABG as a revascularization option. He has diffuse multi-vessel CAD with sub-optimal targets. His vessels are not favorable for PCI. The PDA would be a target for stenting. Continue statin and beta blocker. Will start ASA. His oral anticoagulation is on hold. Will resume IV heparin 6 hours post sheath removal.   Diagnostic Dominance: Right   Echo: 03/13/20  IMPRESSIONS   1. Global hypokinesis with akinesis of the inferolateral wall; overall  severe LV dysfunction; elevated LV filling pressure; mild LVH; mild LVE;  severe MR; biatrial enlargement; mild RV dysfunction; mild TR with  moderate pulmonary hypertension.  2. Left ventricular ejection fraction, by  estimation, is <20%. The left  ventricle has severely decreased function. The left ventricle has no  regional wall motion abnormalities. The left  ventricular internal cavity  size was mildly dilated. There is mild  left ventricular hypertrophy. Left ventricular diastolic function could  not be evaluated. Elevated left atrial pressure.  3. Right ventricular systolic function is mildly reduced. The right  ventricular size is normal. There is moderately elevated pulmonary artery  systolic pressure.  4. Left atrial size was severely dilated.  5. Right atrial size was moderately dilated.  6. The mitral valve is normal in structure. Severe mitral valve  regurgitation. No evidence of mitral stenosis.  7. The aortic valve is tricuspid. Aortic valve regurgitation is not  visualized. No aortic stenosis is present.  8. The inferior vena cava is dilated in size with <50% respiratory  variability, suggesting right atrial pressure of 15 mmHg.   Patient Profile     69 y.o. male with history of CAD with prior stenting of the LAD, Diagonal and OM in an outside hospital, ischemic cardiomyopathy with ICD in place, chronic systolic CHF, MVP s/p mitral valve repair in 2004 at [facility], permanent atrial fib on chronic anticoagulation, HTN, HLD, DM, depression who was admitted to [facility] with c/o progressive weakness, fatigue, dyspnea and chest pressure. Troponin mildly elevated at 0.06. He was transferred to [facility] for cardiac cath. His cardiac catheterization shows severe three vessel CAD.  Assessment & Plan    1. Chest pain 2. Multi-vessel CAD - transferred from [facility] with chest pain - cath as noted above, severe 3v disease - TCTS consulted and is planning for CABG on Monday 03/30/2020 - continue heparin gtt, ASA, statin, BB - pt complains of chest tightness, but not painful enough to warrant nitro   3. Persistent Afib/flutter - IV heparin, eliquis on hold - continue  amiodarone and BB - consider LAA at the time of CABG - rates are well-controlled   4. MVP s/p mitral valve repair in 2004 - echo this admission with severe MR noted   5. ICM with ICD in place - EF < 20% this admission, down from 25-30% - elevated wedge on heart cath - diuresing on 40 mg IV lasix BID - daily weights and strict I&Os not entered - I will order - continue IV diuresis today   6. Hypertension - 5 mg amlodipine, 100 mg toprol - pressures have been well-controlled   7. Hyperlipidemia - increased lipitor to 90 mg - 01/06/2020: Cholesterol 124; HDL 32; LDL Cholesterol 63; Triglycerides 147; VLDL 29   8. DM - A1c 11% - SSI - pt was not consistently taking insulin   9. Acute on chronic kidney injury - sCr 2.22 - baseline appears to be 1.2-1.5     For questions or updates, please contact [place] Please consult [email or web address] for contact info under        Signed, Ledora Bottcher, PA  03/15/2020, 8:35 AM

## 2020-03-15 NOTE — Progress Notes (Signed)
Report called to 2 H.  Transfer report given.  Transported via bed by Kathaleen Grinder, RN.  Per patient request, call placed to daughter, Ander Purpura at the listed mobile and home numbers.  Messages left at both phones.

## 2020-03-15 NOTE — Progress Notes (Signed)
Subjective:  No complaints  Objective:  Vital Signs in the last 24 hours: Temp:  [97.6 F (36.4 C)-98.4 F (36.9 C)] 98 F (36.7 C) (04/03 1622) Pulse Rate:  [25-99] 25 (04/03 1622) Resp:  [15-20] 15 (04/03 1622) BP: (97-115)/(70-88) 109/88 (04/03 1622) SpO2:  [91 %-100 %] 96 % (04/03 1622)  Intake/Output from previous day: 04/02 0701 - 04/03 0700 In: 672.3 [P.O.:600; I.V.:72.3] Out: -  Intake/Output from this shift: Total I/O In: 642 [P.O.:440; I.V.:202] Out: 325 [Urine:325]  Physical Exam: General appearance: alert and cooperative Lungs: diminished breath sounds bibasilar Heart: regular rate and rhythm, S1, S2 normal, no murmur, click, rub or gallop Extremities: extremities normal, atraumatic, no cyanosis or edema Skin: Skin color, texture, turgor normal. No rashes or lesions or warm Neurologic: Alert and oriented X 3, normal strength and tone. Normal symmetric reflexes. Normal coordination and gait  Lab Results: Recent Labs    03/13/20 0331 03/15/20 0311  WBC 5.1 4.2  HGB 12.5* 12.0*  PLT 165 151   Recent Labs    03/14/20 1219 03/15/20 0311  NA 137 139  K 3.6 3.2*  CL 100 102  CO2 24 24  GLUCOSE 191* 152*  BUN 31* 33*  CREATININE 2.17* 2.22*   No results for input(s): TROPONINI in the last 72 hours.  Invalid input(s): CK, MB Hepatic Function Panel Recent Labs    03/15/20 0311  PROT 5.3*  ALBUMIN 3.2*  AST 21  ALT 21  ALKPHOS 72  BILITOT 1.0   No results for input(s): CHOL in the last 72 hours. No results for input(s): PROTIME in the last 72 hours.  Imaging: No results found.  Cardiac Studies:  Assessment/Plan:  Atrial Fibrillation Cardiomyopathy Coronary Artery Disease  LOS: 3 days  Films and case reviewed with Dr. Haroldine Laws. The patient has had increased sCr levels over the past couple of days. I'm concerned this represents worsening heart failure due to low CO. Suggest admission to ICU for invasive monitoring and milrinone infusion.  Although originally considered for CABG, based on his need for redo surgery, relatively poor targets for CABG, and very low EF with low CO, it may be more worthwhile to consider durable LVAD rather than cabg.   [name] [name] 03/15/2020, 5:21 PM

## 2020-03-16 DIAGNOSIS — R570 Cardiogenic shock: Secondary | ICD-10-CM | POA: Diagnosis not present

## 2020-03-16 LAB — BASIC METABOLIC PANEL
Anion gap: 11 (ref 5–15)
BUN: 34 mg/dL — ABNORMAL HIGH (ref 8–23)
CO2: 22 mmol/L (ref 22–32)
Calcium: 8.5 mg/dL — ABNORMAL LOW (ref 8.9–10.3)
Chloride: 104 mmol/L (ref 98–111)
Creatinine, Ser: 2.03 mg/dL — ABNORMAL HIGH (ref 0.61–1.24)
GFR calc Af Amer: 37 mL/min — ABNORMAL LOW (ref >60)
GFR calc non Af Amer: 32 mL/min — ABNORMAL LOW (ref >60)
Glucose, Bld: 232 mg/dL — ABNORMAL HIGH (ref 70–99)
Potassium: 3.7 mmol/L (ref 3.5–5.1)
Sodium: 137 mmol/L (ref 135–145)

## 2020-03-16 LAB — GLUCOSE, CAPILLARY
Glucose-Capillary: 245 mg/dL — ABNORMAL HIGH (ref 70–99)
Glucose-Capillary: 234 mg/dL — ABNORMAL HIGH (ref 70–99)
Glucose-Capillary: 255 mg/dL — ABNORMAL HIGH (ref 70–99)
Glucose-Capillary: 237 mg/dL — ABNORMAL HIGH (ref 70–99)

## 2020-03-16 LAB — HEPARIN LEVEL (UNFRACTIONATED): Heparin Unfractionated: 0.49 IU/mL (ref 0.30–0.70)

## 2020-03-16 LAB — COOXEMETRY PANEL
Carboxyhemoglobin: 1.2 % (ref 0.5–1.5)
Methemoglobin: 0.4 % (ref 0.0–1.5)
O2 Saturation: 70.3 %
Total hemoglobin: 11.5 g/dL — ABNORMAL LOW (ref 12.0–16.0)

## 2020-03-16 MED ORDER — FUROSEMIDE 10 MG/ML IJ SOLN
80.0000 mg | Freq: Once | INTRAMUSCULAR | Status: AC
  Administered 2020-03-16: 80 mg via INTRAVENOUS
  Filled 2020-03-16: qty 8

## 2020-03-16 NOTE — Progress Notes (Signed)
Advanced Heart Failure Rounding Note   Subjective:    Swan placed 4/3. Initial co-ox 39%  Now on milrinone 0.25 an NE 0.5 Co-ox 70%   Feeling better. Less SOB. Less fatigued.  Creatinine 2.2 -> 2.0  Swan this am: CVP 13 PA 43/17 Thermo 5.0/2.2 PaPi 2.0    Repeat swan numbers tonight (done personally)\  On milrinone 0.25  RA 16 PA 50/22 PCWP 25 (v 40) Themo 5.0/2.2 SVR 1322 PaPI 1.8     Objective:   Weight Range:  Vital Signs:   Temp:  [96.3 F (35.7 C)-97.9 F (36.6 C)] 97.2 F (36.2 C) (04/04 1530) Pulse Rate:  [57-77] 67 (04/04 1530) Resp:  [0-20] 12 (04/04 1530) BP: (81-138)/(46-94) 107/69 (04/04 1530) SpO2:  [91 %-100 %] 94 % (04/04 1530) Weight:  [112.7 kg] 112.7 kg (04/04 0500) Last BM Date: 03/13/20  Weight change: Filed Weights   03/05/2020 1801 03/16/20 0500  Weight: 111.1 kg 112.7 kg    Intake/Output:   Intake/Output Summary (Last 24 hours) at 03/16/2020 1639 Last data filed at 03/16/2020 1530 Gross per 24 hour  Intake 1155.96 ml  Output 650 ml  Net 505.96 ml     Physical Exam: General:  Sitting up in bed HEENT: normal poor dentition Neck: supple. RIJ swan . Carotids 2+ bilat; no bruits. No lymphadenopathy or thryomegaly appreciated. Cor: PMI nondisplaced. Irregular rate & rhythm. +s3 Lungs: clear Abdomen: soft, nontender, nondistended. No hepatosplenomegaly. No bruits or masses. Good bowel sounds. Extremities: no cyanosis, clubbing, rash, edema Neuro: alert & orientedx3, cranial nerves grossly intact. moves all 4 extremities w/o difficulty. Affect pleasant  Telemetry: AF 60-70s Personally reviewed   Labs: Basic Metabolic Panel: Recent Labs  Lab 02/16/2020 1434 03/13/20 0331 03/13/20 0331 03/14/20 1219 03/15/20 0311 03/16/20 0433  NA 140 139  --  137 139 137  K 3.3* 3.7  --  3.6 3.2* 3.7  CL  --  103  --  100 102 104  CO2  --  26  --  24 24 22   GLUCOSE  --  188*  --  191* 152* 232*  BUN  --  25*  --  31* 33* 34*   CREATININE  --  1.91*  --  2.17* 2.22* 2.03*  CALCIUM  --  8.8*   < > 8.6* 8.9 8.5*   < > = values in this interval not displayed.    Liver Function Tests: Recent Labs  Lab 03/15/20 0311  AST 21  ALT 21  ALKPHOS 72  BILITOT 1.0  PROT 5.3*  ALBUMIN 3.2*   No results for input(s): LIPASE, AMYLASE in the last 168 hours. No results for input(s): AMMONIA in the last 168 hours.  CBC: Recent Labs  Lab 03/09/2020 1429 02/27/2020 1434 03/13/20 0331 03/15/20 0311  WBC  --   --  5.1 4.2  HGB 12.2* 12.6* 12.5* 12.0*  HCT 36.0* 37.0* 37.6* 36.3*  MCV  --   --  92.2 92.8  PLT  --   --  165 151    Cardiac Enzymes: No results for input(s): CKTOTAL, CKMB, CKMBINDEX, TROPONINI in the last 168 hours.  BNP: BNP (last 3 results) Recent Labs    01/04/20 1642  BNP 165.2*    ProBNP (last 3 results) No results for input(s): PROBNP in the last 8760 hours.    Other results:  Imaging: DG CHEST PORT 1 VIEW  Result Date: 03/15/2020 CLINICAL DATA:  Line placement EXAM: PORTABLE CHEST 1 VIEW COMPARISON:  X-ray from same day FINDINGS: There is been interval placement of a Swan-Ganz catheter with the tip terminating over the right lower lobe pulmonary artery. The heart size remains enlarged. The patient is status post prior median sternotomy. There is a dual chamber left-sided AICD in place. There is vascular congestion without overt pulmonary edema. IMPRESSION: Swan-Ganz catheter projects over the right lower lobe pulmonary artery. There is no pneumothorax. Otherwise, no significant short interval change. Electronically Signed   By: Constance Holster M.D.   On: 03/15/2020 20:16   DG CHEST PORT 1 VIEW  Result Date: 03/15/2020 CLINICAL DATA:  Line placement. EXAM: PORTABLE CHEST 1 VIEW COMPARISON:  March 11, 2020 FINDINGS: There is been interval placement of a Swan-Ganz catheter with the tip terminating over a segmental branch of the right lower lobe pulmonary artery. The heart size remains  enlarged. The patient is status post prior median sternotomy. There is a dual chamber left-sided AICD in place. There is vascular congestion without overt pulmonary edema. IMPRESSION: 1. Swan-Ganz catheter as detailed above. No evidence for pneumothorax. 2. Stable cardiomegaly with mild vascular congestion. Electronically Signed   By: Constance Holster M.D.   On: 03/15/2020 20:15      Medications:     Scheduled Medications: . amiodarone  200 mg Oral Daily  . aspirin  81 mg Oral Daily  . atorvastatin  80 mg Oral q1800  . Chlorhexidine Gluconate Cloth  6 each Topical Daily  . citalopram  20 mg Oral Daily  . influenza vaccine adjuvanted  0.5 mL Intramuscular Tomorrow-1000  . insulin aspart  0-20 Units Subcutaneous TID WC  . insulin aspart  0-5 Units Subcutaneous QHS  . pantoprazole  40 mg Oral Daily  . sodium chloride flush  10-40 mL Intracatheter Q12H  . tamsulosin  0.4 mg Oral Daily     Infusions: . heparin 1,100 Units/hr (03/16/20 1400)  . milrinone 0.25 mcg/kg/min (03/16/20 1400)  . norepinephrine (LEVOPHED) Adult infusion 0.5 mcg/min (03/16/20 1400)     PRN Medications:  acetaminophen, ALPRAZolam, nitroGLYCERIN, ondansetron (ZOFRAN) IV, sodium chloride flush   Assessment/Plan:   1. Acute on chronic systolic HF -> cardiogenic shock  - Echo 2016 EF 30-35% -  Echo 1/21 EF 25% - Echo 4/21 EF 10-15% moderate RV dysfunction - cath with severe 3v CAD and low output with CI 1.7  - now with AKI with attempts at diuresis.  - hold b-blocker - Swan placed 4/3. Initial co-ox 39%. Now on milrinone 0.25 and NE 0.5. Co-ox 70%  - Initial PAPi 1.5. PAPi today 1.8 - 2.0 on milrinone and low-dose NE - Give lasix 80 IV today - Initially plan for CABG but given need for re-do sternotomy, relatively poor targets and longstanding low EF suspect VAD may be better option if RV can tolerate. Will have VAD team review in am. - Will need teeth extraction prior to VAD (has 4 teeth left)  2.  CAD with unstable angina - s/p previous PCI - cath 02/22/2020 with severe 3v CAD - plan as above - continue ASA/statin  3. AKI on CKD 3a - baseline creatinine 1.3-1.5 - up to 2.2. suspect cardio renal - improved to 2.0 today with inotrope support. Hopefully will continue to improve  4. Permanent AF - rate controlled. On Eliquis at home - Continue heparin. No bleeding - Discussed dosing with PharmD personally.  5. MVP s/p MV repair - stable on echo   6. DM2, poorly controlled - hgbA1c 11.1% - continue insulin and SSI  CRITICAL CARE Performed by: Glori Bickers  Total critical care time: 35 minutes  Critical care time was exclusive of separately billable procedures and treating other patients.  Critical care was necessary to treat or prevent imminent or life-threatening deterioration.  Critical care was time spent personally by me (independent of midlevel providers or residents) on the following activities: development of treatment plan with patient and/or surrogate as well as nursing, discussions with consultants, evaluation of patient's response to treatment, examination of patient, obtaining history from patient or surrogate, ordering and performing treatments and interventions, ordering and review of laboratory studies, ordering and review of radiographic studies, pulse oximetry and re-evaluation of patient's condition.      Length of Stay: 4   Glori Bickers MD 03/16/2020, 4:39 PM  Advanced Heart Failure Team Pager [phone] (M-F; 7a - 4p)  Please contact [facility] for night-coverage after hours (4p -7a ) and weekends on [email or web address]

## 2020-03-16 NOTE — Progress Notes (Signed)
[place] for heparin Indication: 3v disease; CABG eval  and persistent Afib/flutter  Allergies  Allergen Reactions  . Liraglutide Nausea And Vomiting  . Lisinopril Cough    Patient Measurements: Height: 6' (182.9 cm) Weight: 112.7 kg (248 lb 7.3 oz) IBW/kg (Calculated) : 77.6 Heparin Dosing Weight: 101.2 kg  Vital Signs: Temp: 97.2 F (36.2 C) (04/04 1530) Temp Source: Core (04/04 1200) BP: 107/69 (04/04 1530) Pulse Rate: 67 (04/04 1530)  Labs: Recent Labs    03/14/20 1219 03/14/20 1219 03/14/20 1801 03/15/20 0311 03/15/20 1313 03/16/20 0433  HGB  --   --   --  12.0*  --   --   HCT  --   --   --  36.3*  --   --   PLT  --   --   --  151  --   --   APTT 72*  --  71* 60*  --   --   HEPARINUNFRC 0.45   < >  --  0.26* 0.41 0.49  CREATININE 2.17*  --   --  2.22*  --  2.03*   < > = values in this interval not displayed.    Estimated Creatinine Clearance: 43.9 mL/min (A) (by C-G formula based on SCr of 2.03 mg/dL (H)).   Medical History: Past Medical History:  Diagnosis Date  . Anxiety   . Arthritis   . CAD (coronary artery disease)    a.  1993 s/p MI - [employer];  b. s/p BMS to LAD '00;  c. PTCA 2nd diagonal 2010;  d. 02/18/12 Cath: moderate nonobs dzs - med rx;  e.  01/2015 Cath: LM nl, LAD 40-80m ISR, 70-60m/d, d1 90p (3.0x16 Synergy DES), D2 50-60, LCX nl, OM1 50p, 25m (2.5x12 Synergy DES), RCA nl, EF 30-35%.  . Chronic combined systolic and diastolic CHF (congestive heart failure) ([place])    a. 12/2014 Echo: EF 30-35%, Gr2 DD, mod MR, sev dil LA.  . CKD (chronic kidney disease), stage III   . Depression   . ED (erectile dysfunction)   . GERD (gastroesophageal reflux disease)   . Hyperlipidemia   . Hypertension   . Ischemic cardiomyopathy    a. s/p St. [name] (Atlas) ICD implanted in [place] 2007;  b. 12/2014 Echo: Ef 30-35%.  . MVP (mitral valve prolapse)    a. s/p MV annuloplasty at [facility] 2004.  Marland Kitchen Persistent  atrial fibrillation ([place])    a. noted on ICD interrogation '10 - not previously on [place] - CHA2DS2VASc = 5.  . Type II diabetes mellitus ([place])    uncontrolled    Medications:  Medications Prior to Admission  Medication Sig Dispense Refill Last Dose  . ALPRAZolam (XANAX) 0.25 MG tablet Take 0.25 mg by mouth 3 (three) times daily as needed for anxiety.    unknown  . amiodarone (PACERONE) 200 MG tablet Take 200 mg by mouth daily.    03/11/2020 at Unknown time  . apixaban (ELIQUIS) 5 MG TABS tablet Take 1 tablet (5 mg total) by mouth 2 (two) times daily. 60 tablet 1 03/11/2020 at 0600  . atorvastatin (LIPITOR) 40 MG tablet Take 1 tablet (40 mg total) by mouth daily at 6 PM. 30 tablet 0 03/10/2020 at Unknown time  . citalopram (CELEXA) 20 MG tablet Take 20 mg by mouth daily.    03/11/2020 at Unknown time  . furosemide (LASIX) 40 MG tablet Take 40 mg by mouth daily.    03/11/2020 at  Unknown time  . insulin aspart (NOVOLOG FLEXPEN) 100 UNIT/ML FlexPen Inject 6 Units into the skin 3 (three) times daily with meals. 15 mL 0 03/11/2020 at Unknown time  . Insulin Glargine (BASAGLAR KWIKPEN) 100 UNIT/ML SOPN Inject 0.5 mLs (50 Units total) into the skin daily. 15 mL 1 03/11/2020 at Unknown time  . metoprolol succinate (TOPROL-XL) 100 MG 24 hr tablet Take 1 tablet (100 mg total) by mouth daily. 90 tablet 3 03/11/2020 at 0600  . nitroGLYCERIN (NITROSTAT) 0.4 MG SL tablet Place 1 tablet (0.4 mg total) under the tongue every 5 (five) minutes x 3 doses as needed for chest pain. 10 tablet 0 unknown  . pantoprazole (PROTONIX) 40 MG tablet Take 1 tablet (40 mg total) by mouth daily. 30 tablet 1 03/11/2020 at Unknown time  . potassium chloride SA (KLOR-CON) 20 MEQ tablet Take 20 mEq by mouth daily.    03/11/2020 at Unknown time  . tamsulosin (FLOMAX) 0.4 MG CAPS capsule Take 1 capsule (0.4 mg total) by mouth daily. 30 capsule 0 03/11/2020 at Unknown time  . ALPRAZolam (XANAX) 1 MG tablet Take 1 tablet (1 mg total) by mouth 2  (two) times daily as needed for anxiety. (Patient not taking: Reported on 02/29/2020) 10 tablet 0 Not Taking at Unknown time    Assessment: 71 YOM with atrial fibrillation on Eliquis PTA s/p R & L heart cath found to have 3v disease now being evaluated for CABG or LVAD    Heparin level therapeutic at 0.49 after increasing dose to 1100 units/hr. CBC stable, no overt bleeding noted.  Goal of Therapy:  Heparin level 0.3-0.7 units/ml Monitor platelets by anticoagulation protocol: Yes   Plan:  Continue heparin gtt at 1100 units/hr Monitor daily heparin level and CBC, s/sx bleeding  Bonnita Nasuti Pharm.D. CPP, BCPS Clinical Pharmacist [phone] 03/16/2020 3:42 PM    Please check [email or web address] for unit-specific pharmacy phone numbers.

## 2020-03-17 ENCOUNTER — Inpatient Hospital Stay (HOSPITAL_COMMUNITY): Payer: Medicare HMO

## 2020-03-17 ENCOUNTER — Encounter (HOSPITAL_COMMUNITY): Admission: AD | Disposition: E | Payer: Self-pay | Attending: Cardiothoracic Surgery

## 2020-03-17 DIAGNOSIS — R570 Cardiogenic shock: Secondary | ICD-10-CM | POA: Diagnosis not present

## 2020-03-17 LAB — CBC
HCT: 34.6 % — ABNORMAL LOW (ref 39.0–52.0)
Hemoglobin: 11.5 g/dL — ABNORMAL LOW (ref 13.0–17.0)
MCH: 30.6 pg (ref 26.0–34.0)
MCHC: 33.2 g/dL (ref 30.0–36.0)
MCV: 92 fL (ref 80.0–100.0)
Platelets: 160 10*3/uL (ref 150–400)
RBC: 3.76 MIL/uL — ABNORMAL LOW (ref 4.22–5.81)
RDW: 13.5 % (ref 11.5–15.5)
WBC: 5.6 10*3/uL (ref 4.0–10.5)
nRBC: 0 % (ref 0.0–0.2)

## 2020-03-17 LAB — HEPATITIS C ANTIBODY: HCV Ab: NONREACTIVE

## 2020-03-17 LAB — LIPID PANEL
Cholesterol: 89 mg/dL (ref 0–200)
HDL: 33 mg/dL — ABNORMAL LOW (ref >40)
LDL Cholesterol: 42 mg/dL (ref 0–99)
Total CHOL/HDL Ratio: 2.7 RATIO
Triglycerides: 69 mg/dL (ref <150)
VLDL: 14 mg/dL (ref 0–40)

## 2020-03-17 LAB — BASIC METABOLIC PANEL
Anion gap: 9 (ref 5–15)
BUN: 29 mg/dL — ABNORMAL HIGH (ref 8–23)
CO2: 26 mmol/L (ref 22–32)
Calcium: 8.7 mg/dL — ABNORMAL LOW (ref 8.9–10.3)
Chloride: 101 mmol/L (ref 98–111)
Creatinine, Ser: 2.1 mg/dL — ABNORMAL HIGH (ref 0.61–1.24)
GFR calc Af Amer: 36 mL/min — ABNORMAL LOW (ref >60)
GFR calc non Af Amer: 31 mL/min — ABNORMAL LOW (ref >60)
Glucose, Bld: 260 mg/dL — ABNORMAL HIGH (ref 70–99)
Potassium: 3.6 mmol/L (ref 3.5–5.1)
Sodium: 136 mmol/L (ref 135–145)

## 2020-03-17 LAB — APTT: aPTT: 80 seconds — ABNORMAL HIGH (ref 24–36)

## 2020-03-17 LAB — COOXEMETRY PANEL
Carboxyhemoglobin: 1.3 % (ref 0.5–1.5)
Methemoglobin: 0.7 % (ref 0.0–1.5)
O2 Saturation: 59.6 %
Total hemoglobin: 11.3 g/dL — ABNORMAL LOW (ref 12.0–16.0)

## 2020-03-17 LAB — T4, FREE: Free T4: 0.91 ng/dL (ref 0.61–1.12)

## 2020-03-17 LAB — URIC ACID: Uric Acid, Serum: 5.7 mg/dL (ref 3.7–8.6)

## 2020-03-17 LAB — PROTIME-INR
INR: 1.3 — ABNORMAL HIGH (ref 0.8–1.2)
Prothrombin Time: 15.9 seconds — ABNORMAL HIGH (ref 11.4–15.2)

## 2020-03-17 LAB — HEPARIN LEVEL (UNFRACTIONATED): Heparin Unfractionated: 0.31 IU/mL (ref 0.30–0.70)

## 2020-03-17 LAB — HEMOGLOBIN A1C
Hgb A1c MFr Bld: 10.7 % — ABNORMAL HIGH (ref 4.8–5.6)
Mean Plasma Glucose: 260.39 mg/dL

## 2020-03-17 LAB — TSH: TSH: 9.134 u[IU]/mL — ABNORMAL HIGH (ref 0.350–4.500)

## 2020-03-17 LAB — GLUCOSE, CAPILLARY
Glucose-Capillary: 285 mg/dL — ABNORMAL HIGH (ref 70–99)
Glucose-Capillary: 274 mg/dL — ABNORMAL HIGH (ref 70–99)
Glucose-Capillary: 249 mg/dL — ABNORMAL HIGH (ref 70–99)
Glucose-Capillary: 279 mg/dL — ABNORMAL HIGH (ref 70–99)

## 2020-03-17 LAB — SURGICAL PCR SCREEN
MRSA, PCR: NEGATIVE
Staphylococcus aureus: NEGATIVE

## 2020-03-17 LAB — LACTATE DEHYDROGENASE: LDH: 194 U/L — ABNORMAL HIGH (ref 98–192)

## 2020-03-17 LAB — PSA: Prostatic Specific Antigen: 0.39 ng/mL (ref 0.00–4.00)

## 2020-03-17 LAB — ANTITHROMBIN III: AntiThromb III Func: 77 % (ref 75–120)

## 2020-03-17 LAB — HEPATITIS B SURFACE ANTIGEN: Hepatitis B Surface Ag: NONREACTIVE

## 2020-03-17 LAB — POCT ACTIVATED CLOTTING TIME: Activated Clotting Time: 202 seconds

## 2020-03-17 LAB — HEPATITIS B CORE ANTIBODY, IGM: Hep B C IgM: NONREACTIVE

## 2020-03-17 LAB — PREALBUMIN: Prealbumin: 15.7 mg/dL — ABNORMAL LOW (ref 18–38)

## 2020-03-17 SURGERY — CORONARY ARTERY BYPASS GRAFTING (CABG)
Anesthesia: General | Site: Chest

## 2020-03-17 MED ORDER — IOHEXOL 9 MG/ML PO SOLN
ORAL | Status: AC
  Filled 2020-03-17: qty 1000

## 2020-03-17 MED ORDER — CEFAZOLIN SODIUM-DEXTROSE 2-4 GM/100ML-% IV SOLN
2.0000 g | INTRAVENOUS | Status: AC
  Administered 2020-03-18: 2 g via INTRAVENOUS

## 2020-03-17 MED ORDER — INSULIN GLARGINE 100 UNIT/ML ~~LOC~~ SOLN
20.0000 [IU] | Freq: Every day | SUBCUTANEOUS | Status: DC
  Administered 2020-03-17: 20 [IU] via SUBCUTANEOUS
  Filled 2020-03-17 (×2): qty 0.2

## 2020-03-17 MED ORDER — POTASSIUM CHLORIDE CRYS ER 20 MEQ PO TBCR
40.0000 meq | EXTENDED_RELEASE_TABLET | Freq: Once | ORAL | Status: AC
  Administered 2020-03-17: 05:00:00 40 meq via ORAL
  Filled 2020-03-17: qty 2

## 2020-03-17 NOTE — Progress Notes (Signed)
CSW met with patient at bedside to complete LVAD assessment. Full assessment to follow. [name], [place], [place]

## 2020-03-17 NOTE — Progress Notes (Signed)
[place] for heparin Indication: 3v disease; CABG eval  and persistent Afib/flutter  Allergies  Allergen Reactions  . Liraglutide Nausea And Vomiting  . Lisinopril Cough    Patient Measurements: Height: 6' (182.9 cm) Weight: 110.1 kg (242 lb 11.6 oz) IBW/kg (Calculated) : 77.6 Heparin Dosing Weight: 101.2 kg  Vital Signs: Temp: 97.7 F (36.5 C) (04/05 1500) Temp Source: Core (04/05 0800) BP: 141/86 (04/05 1500) Pulse Rate: 69 (04/05 1500)  Labs: Recent Labs    03/14/20 1801 03/15/20 0311 03/15/20 0311 03/15/20 1313 03/16/20 0433 03/16/2020 0159 04/04/2020 0200 04/07/2020 1312  HGB  --  12.0*  --   --   --  11.5*  --   --   HCT  --  36.3*  --   --   --  34.6*  --   --   PLT  --  151  --   --   --  160  --   --   APTT 71* 60*  --   --   --   --   --  80*  LABPROT  --   --   --   --   --   --   --  15.9*  INR  --   --   --   --   --   --   --  1.3*  HEPARINUNFRC  --  0.26*   < > 0.41 0.49  --  0.31  --   CREATININE  --  2.22*  --   --  2.03* 2.10*  --   --    < > = values in this interval not displayed.    Estimated Creatinine Clearance: 41.9 mL/min (A) (by C-G formula based on SCr of 2.1 mg/dL (H)).   Medical History: Past Medical History:  Diagnosis Date  . Anxiety   . Arthritis   . CAD (coronary artery disease)    a.  1993 s/p MI - [facility];  b. s/p BMS to LAD '00;  c. PTCA 2nd diagonal 2010;  d. 02/18/12 Cath: moderate nonobs dzs - med rx;  e.  01/2015 Cath: LM nl, LAD 40-74m ISR, 70-6m/d, d1 90p (3.0x16 Synergy DES), D2 50-60, LCX nl, OM1 50p, 67m (2.5x12 Synergy DES), RCA nl, EF 30-35%.  . Chronic combined systolic and diastolic CHF (congestive heart failure) ([place])    a. 12/2014 Echo: EF 30-35%, Gr2 DD, mod MR, sev dil LA.  . CKD (chronic kidney disease), stage III   . Depression   . ED (erectile dysfunction)   . GERD (gastroesophageal reflux disease)   . Hyperlipidemia   . Hypertension   . Ischemic cardiomyopathy     a. s/p St. [name] (Atlas) ICD implanted in [place] 2007;  b. 12/2014 Echo: Ef 30-35%.  . MVP (mitral valve prolapse)    a. s/p MV annuloplasty at [facility] 2004.  Marland Kitchen Persistent atrial fibrillation ([place])    a. noted on ICD interrogation '10 - not previously on [place] - CHA2DS2VASc = 5.  . Type II diabetes mellitus ([place])    uncontrolled    Medications:  Medications Prior to Admission  Medication Sig Dispense Refill Last Dose  . ALPRAZolam (XANAX) 0.25 MG tablet Take 0.25 mg by mouth 3 (three) times daily as needed for anxiety.    unknown  . amiodarone (PACERONE) 200 MG tablet Take 200 mg by mouth daily.    03/11/2020 at Unknown time  . apixaban (ELIQUIS) 5 MG TABS tablet  Take 1 tablet (5 mg total) by mouth 2 (two) times daily. 60 tablet 1 03/11/2020 at 0600  . atorvastatin (LIPITOR) 40 MG tablet Take 1 tablet (40 mg total) by mouth daily at 6 PM. 30 tablet 0 03/10/2020 at Unknown time  . citalopram (CELEXA) 20 MG tablet Take 20 mg by mouth daily.    03/11/2020 at Unknown time  . furosemide (LASIX) 40 MG tablet Take 40 mg by mouth daily.    03/11/2020 at Unknown time  . insulin aspart (NOVOLOG FLEXPEN) 100 UNIT/ML FlexPen Inject 6 Units into the skin 3 (three) times daily with meals. 15 mL 0 03/11/2020 at Unknown time  . Insulin Glargine (BASAGLAR KWIKPEN) 100 UNIT/ML SOPN Inject 0.5 mLs (50 Units total) into the skin daily. 15 mL 1 03/11/2020 at Unknown time  . metoprolol succinate (TOPROL-XL) 100 MG 24 hr tablet Take 1 tablet (100 mg total) by mouth daily. 90 tablet 3 03/11/2020 at 0600  . nitroGLYCERIN (NITROSTAT) 0.4 MG SL tablet Place 1 tablet (0.4 mg total) under the tongue every 5 (five) minutes x 3 doses as needed for chest pain. 10 tablet 0 unknown  . pantoprazole (PROTONIX) 40 MG tablet Take 1 tablet (40 mg total) by mouth daily. 30 tablet 1 03/11/2020 at Unknown time  . potassium chloride SA (KLOR-CON) 20 MEQ tablet Take 20 mEq by mouth daily.    03/11/2020 at Unknown time  . tamsulosin  (FLOMAX) 0.4 MG CAPS capsule Take 1 capsule (0.4 mg total) by mouth daily. 30 capsule 0 03/11/2020 at Unknown time  . ALPRAZolam (XANAX) 1 MG tablet Take 1 tablet (1 mg total) by mouth 2 (two) times daily as needed for anxiety. (Patient not taking: Reported on 03/01/2020) 10 tablet 0 Not Taking at Unknown time    Assessment: 48 YOM with atrial fibrillation on Eliquis PTA s/p R & L heart cath found to have 3v disease now being evaluated for CABG or LVAD    Heparin level therapeutic at 0.31 after increasing dose to 1100 units/hr. CBC stable, no overt bleeding noted.  Goal of Therapy:  Heparin level 0.3-0.7 units/ml Monitor platelets by anticoagulation protocol: Yes   Plan:  Continue heparin gtt at 1100 units/hr - off 0330 tomorrow for planned teeth extraction Monitor daily heparin level and CBC, s/sx bleeding   Bonnita Nasuti Pharm.D. CPP, BCPS Clinical Pharmacist [phone] 04/09/2020 3:06 PM    Please check [email or web address] for unit-specific pharmacy phone numbers.

## 2020-03-17 NOTE — Progress Notes (Signed)
Advanced Heart Failure Rounding Note   Subjective:    Swan placed 4/3. Initial co-ox 39%  Now on milrinone 0.25. Off NE.  Co-ox 60%   Feels ok. Denies CP, SOB, orthopnea or PND.  Diuresed well yesterday.  Weight down 6 pounds. Creatinine 2.2 -> 2.0 -> 2.1   Swan this am: RA 11 PA 41/17 (27) PCWP 18 Thermo 5.5/2.4 SVR 1282 PVR 1.4  PaPI 2.2   Objective:   Weight Range:  Vital Signs:   Temp:  [97.2 F (36.2 C)-98.2 F (36.8 C)] 97.5 F (36.4 C) (04/05 0800) Pulse Rate:  [60-86] 71 (04/05 0800) Resp:  [10-22] 14 (04/05 0800) BP: (85-133)/(38-97) 125/77 (04/05 0800) SpO2:  [90 %-100 %] 100 % (04/05 0800) Weight:  [110.1 kg] 110.1 kg (04/05 0459) Last BM Date: 03/13/20  Weight change: Filed Weights   02/15/2020 1801 03/16/20 0500 03/30/2020 0459  Weight: 111.1 kg 112.7 kg 110.1 kg    Intake/Output:   Intake/Output Summary (Last 24 hours) at 03/24/2020 0902 Last data filed at 03/16/2020 0800 Gross per 24 hour  Intake 961.4 ml  Output 3615 ml  Net -2653.6 ml     Physical Exam: General:  Sitting up in bed. No resp difficulty HEENT: normal  + poor dentition Neck: supple. RIJ swan Carotids 2+ bilat; no bruits. No lymphadenopathy or thryomegaly appreciated. Cor: PMI nondisplaced. irregular rate & rhythm. No rubs, gallops or murmurs. Lungs: clear Abdomen: soft, nontender, nondistended. No hepatosplenomegaly. No bruits or masses. Good bowel sounds. Extremities: no cyanosis, clubbing, rash, edema Neuro: alert & orientedx3, cranial nerves grossly intact. moves all 4 extremities w/o difficulty. Affect pleasant   Telemetry: AF 70s Personally reviewed   Labs: Basic Metabolic Panel: Recent Labs  Lab 03/13/20 0331 03/13/20 0331 03/14/20 1219 03/14/20 1219 03/15/20 0311 03/16/20 0433 03/27/2020 0159  NA 139  --  137  --  139 137 136  K 3.7  --  3.6  --  3.2* 3.7 3.6  CL 103  --  100  --  102 104 101  CO2 26  --  24  --  24 22 26   GLUCOSE 188*  --  191*  --   152* 232* 260*  BUN 25*  --  31*  --  33* 34* 29*  CREATININE 1.91*  --  2.17*  --  2.22* 2.03* 2.10*  CALCIUM 8.8*   < > 8.6*   < > 8.9 8.5* 8.7*   < > = values in this interval not displayed.    Liver Function Tests: Recent Labs  Lab 03/15/20 0311  AST 21  ALT 21  ALKPHOS 72  BILITOT 1.0  PROT 5.3*  ALBUMIN 3.2*   No results for input(s): LIPASE, AMYLASE in the last 168 hours. No results for input(s): AMMONIA in the last 168 hours.  CBC: Recent Labs  Lab 03/11/2020 1429 02/12/2020 1434 03/13/20 0331 03/15/20 0311 03/26/2020 0159  WBC  --   --  5.1 4.2 5.6  HGB 12.2* 12.6* 12.5* 12.0* 11.5*  HCT 36.0* 37.0* 37.6* 36.3* 34.6*  MCV  --   --  92.2 92.8 92.0  PLT  --   --  165 151 160    Cardiac Enzymes: No results for input(s): CKTOTAL, CKMB, CKMBINDEX, TROPONINI in the last 168 hours.  BNP: BNP (last 3 results) Recent Labs    01/04/20 1642  BNP 165.2*    ProBNP (last 3 results) No results for input(s): PROBNP in the last 8760 hours.  Other results:  Imaging: DG CHEST PORT 1 VIEW  Result Date: 03/15/2020 CLINICAL DATA:  Line placement EXAM: PORTABLE CHEST 1 VIEW COMPARISON:  X-ray from same day FINDINGS: There is been interval placement of a Swan-Ganz catheter with the tip terminating over the right lower lobe pulmonary artery. The heart size remains enlarged. The patient is status post prior median sternotomy. There is a dual chamber left-sided AICD in place. There is vascular congestion without overt pulmonary edema. IMPRESSION: Swan-Ganz catheter projects over the right lower lobe pulmonary artery. There is no pneumothorax. Otherwise, no significant short interval change. Electronically Signed   By: Constance Holster M.D.   On: 03/15/2020 20:16   DG CHEST PORT 1 VIEW  Result Date: 03/15/2020 CLINICAL DATA:  Line placement. EXAM: PORTABLE CHEST 1 VIEW COMPARISON:  March 11, 2020 FINDINGS: There is been interval placement of a Swan-Ganz catheter with the tip  terminating over a segmental branch of the right lower lobe pulmonary artery. The heart size remains enlarged. The patient is status post prior median sternotomy. There is a dual chamber left-sided AICD in place. There is vascular congestion without overt pulmonary edema. IMPRESSION: 1. Swan-Ganz catheter as detailed above. No evidence for pneumothorax. 2. Stable cardiomegaly with mild vascular congestion. Electronically Signed   By: Constance Holster M.D.   On: 03/15/2020 20:15     Medications:     Scheduled Medications: . amiodarone  200 mg Oral Daily  . aspirin  81 mg Oral Daily  . atorvastatin  80 mg Oral q1800  . Chlorhexidine Gluconate Cloth  6 each Topical Daily  . citalopram  20 mg Oral Daily  . influenza vaccine adjuvanted  0.5 mL Intramuscular Tomorrow-1000  . insulin aspart  0-20 Units Subcutaneous TID WC  . insulin aspart  0-5 Units Subcutaneous QHS  . pantoprazole  40 mg Oral Daily  . sodium chloride flush  10-40 mL Intracatheter Q12H  . tamsulosin  0.4 mg Oral Daily    Infusions: . heparin 1,100 Units/hr (03/23/2020 0800)  . milrinone 0.25 mcg/kg/min (03/24/2020 0800)  . norepinephrine (LEVOPHED) Adult infusion Stopped (03/16/20 1657)    PRN Medications: acetaminophen, ALPRAZolam, nitroGLYCERIN, ondansetron (ZOFRAN) IV, sodium chloride flush   Assessment/Plan:   1. Acute on chronic systolic HF -> cardiogenic shock  - Echo 2016 EF 30-35% - Echo 1/21 EF 25% - Echo 4/21 EF 10-15% moderate RV dysfunction - cath with severe 3v CAD and low output with CI 1.7  - now with AKI with attempts at diuresis.  - hold b-blocker - Swan placed 4/3. Initial co-ox 39%. Now on milrinone 0.25. Co-ox 60%  - Initial PAPi 1.5. PAPi today 2.2 on milrinone  - Initially plan for CABG but given need for re-do sternotomy, relatively poor targets and longstanding low EF suspect VAD may be better option if RV can tolerate and creatinine improvs. Will have VAD team review in am. - Will need  teeth extraction prior to VAD (has 4 teeth left). Will consult Dr. Enrique Sack - Main issue now for VAD will be renal function  2. CAD with unstable angina - s/p previous PCI - cath 03/11/2020 with severe 3v CAD - No s/s angina. Plan as above - continue ASA/statin  3. AKI on CKD 3a - baseline creatinine 1.3-1.5 - up to 2.2. suspect cardio renal - Hopefully will continue to improve with hemodynamic support - Check UA and renal u/s  4. Permanent AF - rate controlled. On Eliquis at home - Continue heparin. No bleeding - Discussed  dosing with PharmD personally.  5. MVP s/p MV repair - now with recurrent severe MR on echo and with huge v-waves on PCWP tracing - plan VAD  6. DM2, poorly controlled - hgbA1c 11.1% - continue insulin and SSI  - regimen adjusted today  CRITICAL CARE Performed by: Glori Bickers  Total critical care time: 35 minutes  Critical care time was exclusive of separately billable procedures and treating other patients.  Critical care was necessary to treat or prevent imminent or life-threatening deterioration.  Critical care was time spent personally by me (independent of midlevel providers or residents) on the following activities: development of treatment plan with patient and/or surrogate as well as nursing, discussions with consultants, evaluation of patient's response to treatment, examination of patient, obtaining history from patient or surrogate, ordering and performing treatments and interventions, ordering and review of laboratory studies, ordering and review of radiographic studies, pulse oximetry and re-evaluation of patient's condition.      Length of Stay: 5   Glori Bickers MD 03/13/2020, 9:02 AM  Advanced Heart Failure Team Pager [phone] (M-F; [place])  Please contact [facility] for night-coverage after hours (4p -7a ) and weekends on [email or web address]

## 2020-03-17 NOTE — Consult Note (Signed)
DENTAL CONSULTATION  Date of Consultation:  04/02/2020 Patient Name:   [name] [name] Date of Birth:   February 03, 1951 Medical Record Number: [id number]  COVID 19 SCREENING: The patient does not symptoms concerning for COVID-19 infection (Including fever, chills, cough, or new SHORTNESS OF BREATH).    VITALS: BP 130/84   Pulse 73   Temp (!) 97.5 F (36.4 C)   Resp 12   Ht 6' (1.829 m)   Wt 110.1 kg   SpO2 98%   BMI 32.92 kg/m   CHIEF COMPLAINT: Patient referred by Dr. Haroldine Laws for dental consultation.  HPI: SILVIO SAUSEDO is a 69 year old male recently diagnosed with heart failure with anticipated LVAD placement.  Patient now seen as part of a medically necessary pre-LVAD placement dental protocol examination.  The patient currently denies acute toothaches, swellings, or abscesses.  Patient was last seen 2 to 3 months ago in [place] to have several teeth extracted.  Patient denies any complications from that dental extractions.  Patient indicates that he only has 4 lower teeth remaining.  Patient denies having any partial dentures.  Patient denies having any dental phobia.  Patient is interested in having all remaining for lower teeth extracted at this time.  PROBLEM LIST: Patient Active Problem List   Diagnosis Date Noted  . TSH elevation   . Severe episode of recurrent major depressive disorder, without psychotic features ([place]) 02/07/2020  . Diabetic retinopathy associated with type 2 diabetes mellitus ([place]) 01/30/2020  . Elevated troponin   . Diarrhea 01/05/2020  . Abdominal pain 01/05/2020  . Acute on chronic combined systolic and diastolic CHF (congestive heart failure) ([place])   . Benign prostatic hyperplasia   . Tick bite 06/02/2019  . COVID-19 04/17/2019  . Chronic anticoagulation 09/22/2018  . CKD (chronic kidney disease) 09/22/2018  . Pneumonia, unspecified organism 09/22/2018  . Atrial fibrillation with RVR ([place])   . A-fib ([place]) 11/29/2016  . Atrial  fibrillation ([place]) 11/29/2016  . Hyperglycemia   . Atrial flutter with rapid ventricular response ([place]) 09/27/2016  . Dizziness 09/27/2016  . Chest pain   . Shortness of breath   . S/P coronary artery stent placement   . Chronic systolic heart failure ([place]) 04/22/2015  . Abnormal nuclear stress test 01/14/2015  . Chronic systolic dysfunction of left ventricle 07/25/2013  . DM (diabetes mellitus), type 2 ([place]) 03/19/2013  . ICD (implantable cardiac defibrillator) battery depletion 03/18/2013  . Encounter for adjustment and management of automatic implantable cardiac defibrillator 03/18/2013  . Syncope 09/15/2012  . CAD, multiple vessel 02/19/2012  . Unstable angina ([place]) 02/19/2012  . Automatic implantable cardioverter-defibrillator in situ 08/18/2011  . Ischemic cardiomyopathy 05/05/2011  . Hypertension   . Hyperlipidemia   . GERD (gastroesophageal reflux disease)   . Non-rheumatic mitral regurgitation   . IHD (ischemic heart disease)   . Anxiety   . Bipolar I disorder with depression ([place]) 01/06/2011    PMH: Past Medical History:  Diagnosis Date  . Anxiety   . Arthritis   . CAD (coronary artery disease)    a.  1993 s/p MI - [employer];  b. s/p BMS to LAD '00;  c. PTCA 2nd diagonal 2010;  d. 02/18/12 Cath: moderate nonobs dzs - med rx;  e.  01/2015 Cath: LM nl, LAD 40-6m ISR, 70-25m/d, d1 90p (3.0x16 Synergy DES), D2 50-60, LCX nl, OM1 50p, 41m (2.5x12 Synergy DES), RCA nl, EF 30-35%.  . Chronic combined systolic and diastolic CHF (congestive heart failure) ([place])  a. 12/2014 Echo: EF 30-35%, Gr2 DD, mod MR, sev dil LA.  . CKD (chronic kidney disease), stage III   . Depression   . ED (erectile dysfunction)   . GERD (gastroesophageal reflux disease)   . Hyperlipidemia   . Hypertension   . Ischemic cardiomyopathy    a. s/p St. [name] (Atlas) ICD implanted in [place] 2007;  b. 12/2014 Echo: Ef 30-35%.  . MVP (mitral valve prolapse)    a. s/p MV annuloplasty at [facility] 2004.  Marland Kitchen Persistent atrial fibrillation ([place])    a. noted on ICD interrogation '10 - not previously on [place] - CHA2DS2VASc = 5.  . Type II diabetes mellitus ([place])    uncontrolled    PSH: Past Surgical History:  Procedure Laterality Date  . CARDIAC DEFIBRILLATOR PLACEMENT  2007   implanted in [place], has a 7001 RV lead and a SJM Atlas ICD followed by Dr Caryl Comes  . CARDIOVERSION N/A 09/29/2016   Procedure: CARDIOVERSION;  Surgeon: Lelon Perla, MD;  Location: [facility] ENDOSCOPY;  Service: Cardiovascular;  Laterality: N/A;  . CARDIOVERSION N/A 12/03/2016   Procedure: CARDIOVERSION;  Surgeon: Dorothy Spark, MD;  Location: [place];  Service: Cardiovascular;  Laterality: N/A;  . IMPLANTABLE CARDIOVERTER DEFIBRILLATOR (ICD) GENERATOR CHANGE N/A 03/19/2013   SJM Fortify ST DR generator placed by Dr Lovena Le, part of Analyze ST study  . LEFT AND RIGHT HEART CATHETERIZATION WITH CORONARY ANGIOGRAM N/A 01/14/2015   Procedure: LEFT AND RIGHT HEART CATHETERIZATION WITH CORONARY ANGIOGRAM;  Surgeon: Peter M Martinique, MD;  Location: [facility] CATH LAB;  Service: Cardiovascular;  Laterality: N/A;  . LEFT HEART CATHETERIZATION WITH CORONARY ANGIOGRAM N/A 02/17/2012   Procedure: LEFT HEART CATHETERIZATION WITH CORONARY ANGIOGRAM;  Surgeon: Jolaine Artist, MD;  Location: [facility] CATH LAB;  Service: Cardiovascular;  Laterality: N/A;  . MITRAL VALVE ANNULOPLASTY  2004   Archie Endo 02/17/2012  . RIGHT/LEFT HEART CATH AND CORONARY ANGIOGRAPHY N/A 02/23/2020   Procedure: RIGHT/LEFT HEART CATH AND CORONARY ANGIOGRAPHY;  Surgeon: Burnell Blanks, MD;  Location: [facility] CV LAB;  Service: Cardiovascular;  Laterality: N/A;  . TEE WITHOUT CARDIOVERSION N/A 09/29/2016   Procedure: TRANSESOPHAGEAL ECHOCARDIOGRAM (TEE);  Surgeon: Lelon Perla, MD;  Location: [facility] ENDOSCOPY;  Service: Cardiovascular;  Laterality: N/A;  . TEE WITHOUT CARDIOVERSION N/A 12/03/2016   Procedure: TRANSESOPHAGEAL ECHOCARDIOGRAM (TEE);   Surgeon: Dorothy Spark, MD;  Location: [facility] ENDOSCOPY;  Service: Cardiovascular;  Laterality: N/A;    ALLERGIES: Allergies  Allergen Reactions  . Liraglutide Nausea And Vomiting  . Lisinopril Cough    MEDICATIONS: Current Facility-Administered Medications  Medication Dose Route Frequency Provider Last Rate Last Admin  . acetaminophen (TYLENOL) tablet 650 mg  650 mg Oral Q4H PRN Atkins, Glenice Bow, MD      . ALPRAZolam Duanne Moron) tablet 1 mg  1 mg Oral BID PRN Wonda Olds, MD      . amiodarone (PACERONE) tablet 200 mg  200 mg Oral Daily Wonda Olds, MD   200 mg at 03/22/2020 0923  . aspirin chewable tablet 81 mg  81 mg Oral Daily Wonda Olds, MD   81 mg at 03/29/2020 0924  . atorvastatin (LIPITOR) tablet 80 mg  80 mg Oral q1800 Wonda Olds, MD   80 mg at 03/16/20 1846  . Chlorhexidine Gluconate Cloth 2 % PADS 6 each  6 each Topical Daily Bensimhon, Shaune Pascal, MD   6 each at 04/11/2020 [phone]  . citalopram (CELEXA) tablet 20 mg  20 mg  Oral Daily Wonda Olds, MD   20 mg at 03/30/2020 0924  . heparin ADULT infusion 100 units/mL (25000 units/253mL sodium chloride 0.45%)  1,100 Units/hr Intravenous Continuous Lenn Cal, DDS 11 mL/hr at 03/16/2020 0800 1,100 Units/hr at 03/23/2020 0800  . influenza vaccine adjuvanted (FLUAD) injection 0.5 mL  0.5 mL Intramuscular Tomorrow-1000 Shelva Majestic A, MD      . insulin aspart (novoLOG) injection 0-20 Units  0-20 Units Subcutaneous TID WC Wonda Olds, MD   4 Units at 03/13/2020 1300  . insulin aspart (novoLOG) injection 0-5 Units  0-5 Units Subcutaneous QHS Wonda Olds, MD   2 Units at 03/16/20 2220  . insulin glargine (LANTUS) injection 20 Units  20 Units Subcutaneous Daily Troy Sine, MD   20 Units at 04/03/2020 1100  . iohexol (OMNIPAQUE) 9 MG/ML oral solution           . milrinone (PRIMACOR) 20 MG/100 ML (0.2 mg/mL) infusion  0.25 mcg/kg/min Intravenous Continuous Bensimhon, Shaune Pascal, MD 8.33 mL/hr at 03/31/2020 0800  0.25 mcg/kg/min at 03/25/2020 0800  . nitroGLYCERIN (NITROSTAT) SL tablet 0.4 mg  0.4 mg Sublingual Q5 Min x 3 PRN Atkins, Glenice Bow, MD      . norepinephrine (LEVOPHED) 4mg  in 251mL premix infusion  0-40 mcg/min Intravenous Titrated Bensimhon, Shaune Pascal, MD   Stopped at 03/16/20 1657  . ondansetron (ZOFRAN) injection 4 mg  4 mg Intravenous Q6H PRN Wonda Olds, MD   4 mg at 03/13/20 0840  . pantoprazole (PROTONIX) EC tablet 40 mg  40 mg Oral Daily Wonda Olds, MD   40 mg at 03/28/2020 0924  . sodium chloride flush (NS) 0.9 % injection 10-40 mL  10-40 mL Intracatheter Q12H Bensimhon, Shaune Pascal, MD      . sodium chloride flush (NS) 0.9 % injection 10-40 mL  10-40 mL Intracatheter PRN Bensimhon, Shaune Pascal, MD      . tamsulosin (FLOMAX) capsule 0.4 mg  0.4 mg Oral Daily Wonda Olds, MD   0.4 mg at 03/15/2020 5643    LABS: Lab Results  Component Value Date   WBC 5.6 03/26/2020   HGB 11.5 (L) 04/05/2020   HCT 34.6 (L) 04/09/2020   MCV 92.0 04/04/2020   PLT 160 03/16/2020      Component Value Date/Time   NA 136 03/15/2020 0159   NA 132 (L) 03/07/2020 1501   K 3.6 03/14/2020 0159   CL 101 03/27/2020 0159   CO2 26 03/21/2020 0159   GLUCOSE 260 (H) 03/25/2020 0159   BUN 29 (H) 03/23/2020 0159   BUN 16 03/07/2020 1501   CREATININE 2.10 (H) 03/16/2020 0159   CREATININE 1.29 (H) 12/10/2016 1011   CALCIUM 8.7 (L) 03/14/2020 0159   GFRNONAA 31 (L) 04/06/2020 0159   GFRNONAA 57 (L) 01/28/2015 1558   GFRAA 36 (L) 04/03/2020 0159   GFRAA 66 01/28/2015 1558   Lab Results  Component Value Date   INR 1.3 (H) 03/29/2020   INR 1.3 (H) 01/07/2020   INR 1.3 (H) 01/06/2020   No results found for: PTT  SOCIAL HISTORY: Social History   Socioeconomic History  . Marital status: Married    Spouse name: Not on file  . Number of children: 8  . Years of education: Not on file  . Highest education level: Not on file  Occupational History  . Occupation: Community education officer    Comment: disabled   Tobacco Use  . Smoking status: Never Smoker  . Smokeless  tobacco: Never Used  Substance and Sexual Activity  . Alcohol use: Yes    Alcohol/week: 0.0 standard drinks    Comment: 01/14/2015 "used to drink alot; stopped completely in 1983"  . Drug use: No    Comment: 01/14/2015 "used to use about anything I could get; stopped completely in 1983"  . Sexual activity: Not Currently  Other Topics Concern  . Not on file  Social History Narrative  . Not on file   Social Determinants of Health   Financial Resource Strain:   . Difficulty of Paying Living Expenses:   Food Insecurity:   . Worried About Charity fundraiser in the Last Year:   . Arboriculturist in the Last Year:   Transportation Needs:   . Film/video editor (Medical):   Marland Kitchen Lack of Transportation (Non-Medical):   Physical Activity:   . Days of Exercise per Week:   . Minutes of Exercise per Session:   Stress:   . Feeling of Stress :   Social Connections:   . Frequency of Communication with Friends and Family:   . Frequency of Social Gatherings with Friends and Family:   . Attends Religious Services:   . Active Member of Clubs or Organizations:   . Attends Archivist Meetings:   Marland Kitchen Marital Status:   Intimate Partner Violence:   . Fear of Current or Ex-Partner:   . Emotionally Abused:   Marland Kitchen Physically Abused:   . Sexually Abused:     FAMILY HISTORY: Family History  Problem Relation Age of Onset  . Coronary artery disease Mother   . Colon cancer Mother   . Heart attack Mother   . Heart disease Mother   . Heart failure Mother   . Hypertension Mother   . Malignant hyperthermia Mother   . Colon cancer Sister   . Coronary artery disease Brother   . Heart disease Brother     REVIEW OF SYSTEMS: Reviewed with the patient as per History of present illness. Psych: Patient denies having dental phobia.  DENTAL HISTORY: CHIEF COMPLAINT: Patient referred by Dr. Haroldine Laws for dental  consultation.  HPI: GURINDER TORAL is a 69 year old male recently diagnosed with heart failure with anticipated LVAD placement.  Patient now seen as part of a medically necessary pre-LVAD placement dental protocol examination.  The patient currently denies acute toothaches, swellings, or abscesses.  Patient was last seen 2 to 3 months ago in [place] to have several teeth extracted.  Patient denies any complications from that dental extractions.  Patient indicates that he only has 4 lower teeth remaining.  Patient denies having any partial dentures.  Patient denies having any dental phobia.  Patient is interested in having all remaining for lower teeth extracted at this time.  DENTAL EXAMINATION: GENERAL: The patient is a well-developed, well-nourished male laying in hospital bed. HEAD AND NECK: No palpable neck lymphadenopath [place] to severe cardiovascular compromise.  Y.  The patient denies acute TMJ symptoms. INTRAORAL EXAM: Patient has normal saliva.  There is no evidence of oral abscess formation DENTITION: Patient is edentulous with the exception of tooth numbers 20, 21, 22, and 23. PERIODONTAL: Patient has chronic periodontitis with plaque and calculus accumulations, generalized gingival recession, generalized tooth mobility.  There is moderate to severe bone loss noted. DENTAL CARIES/SUBOPTIMAL RESTORATIONS: No obvious dental caries are noted. ENDODONTIC: Patient denies acute vulvitis symptoms CROWN AND BRIDGE: The patient denies having any chronic bridge restorations. PROSTHODONTIC: Patient denies having any dentures. OCCLUSION: Patient  has a poor occlusal scheme secondary to multiple missing teeth and lack replacing the missing teeth with dental prostheses.  RADIOGRAPHIC INTERPRETATION: An orthopantogram was taken today. There are multiple missing teeth with the exception of tooth numbers 20 through 23.  There is moderate to severe bone loss.  Radiographic calculus is noted.  No  obvious periapical radiolucencies are noted.   ASSESSMENTS: 1.  Heart failure 2.  Pre-LVAD placement dental protocol 3.  Chronic periodontitis with bone loss 4.  Gingival recession 5.  Accretions 6.  Tooth mobility 7.  Multiple missing teeth 8.  No history of partial dentures or complete dentures 9.  Poor occlusal scheme and malocclusion 10.  Risk for complications up to and including death with anticipated invasive dental procedures in the operating room. 11.  Risk of bleeding with invasive dental procedures due to heparin therapy.  PLAN/RECOMMENDATIONS: 1. I discussed the risks, benefits, and complications of various treatment options with the patient in relationship to his medical and dental conditions, anticipated LVAD placement, and risk for infection and endocarditis.. We discussed various treatment options to include no treatment, multiple extractions with alveoloplasty, pre-prosthetic surgery as indicated, periodontal therapy, dental restorations, root canal therapy, crown and bridge therapy, implant therapy, and replacement of missing teeth as indicated. The patient currently wishes to proceed with correction remaining lower teeth with alveoloplasty in the operating room.  Patient will then follow-up with a primary dentist of his choice for fabrication of upper and lower complete dentures after adequate healing and once stable from the anticipated LVAD placement.  The heparin therapy will be discontinued at 3:30 AM to allow for dental extractions at 9:30 AM tomorrow morning.  Heparin therapy will then be restarted with no bolus approximately 12 hours after the dental extractions are complete.   2. Discussion of findings with medical team and coordination of future medical and dental care as needed.   Lenn Cal, DDS

## 2020-03-18 ENCOUNTER — Encounter (HOSPITAL_COMMUNITY): Admission: AD | Disposition: E | Payer: Self-pay | Attending: Cardiothoracic Surgery

## 2020-03-18 ENCOUNTER — Inpatient Hospital Stay (HOSPITAL_COMMUNITY): Payer: Medicare HMO | Admitting: Anesthesiology

## 2020-03-18 ENCOUNTER — Encounter (HOSPITAL_COMMUNITY): Payer: Self-pay | Admitting: Cardiovascular Disease

## 2020-03-18 DIAGNOSIS — R570 Cardiogenic shock: Secondary | ICD-10-CM | POA: Diagnosis not present

## 2020-03-18 HISTORY — PX: MULTIPLE EXTRACTIONS WITH ALVEOLOPLASTY: SHX5342

## 2020-03-18 LAB — HEPATIC FUNCTION PANEL
ALT: 18 U/L (ref 0–44)
AST: 16 U/L (ref 15–41)
Albumin: 3.3 g/dL — ABNORMAL LOW (ref 3.5–5.0)
Alkaline Phosphatase: 64 U/L (ref 38–126)
Bilirubin, Direct: 0.3 mg/dL — ABNORMAL HIGH (ref 0.0–0.2)
Indirect Bilirubin: 0.6 mg/dL (ref 0.3–0.9)
Total Bilirubin: 0.9 mg/dL (ref 0.3–1.2)
Total Protein: 5.8 g/dL — ABNORMAL LOW (ref 6.5–8.1)

## 2020-03-18 LAB — GLUCOSE, CAPILLARY
Glucose-Capillary: 265 mg/dL — ABNORMAL HIGH (ref 70–99)
Glucose-Capillary: 269 mg/dL — ABNORMAL HIGH (ref 70–99)
Glucose-Capillary: 135 mg/dL — ABNORMAL HIGH (ref 70–99)
Glucose-Capillary: 264 mg/dL — ABNORMAL HIGH (ref 70–99)
Glucose-Capillary: 241 mg/dL — ABNORMAL HIGH (ref 70–99)
Glucose-Capillary: 297 mg/dL — ABNORMAL HIGH (ref 70–99)

## 2020-03-18 LAB — CBC
HCT: 33.9 % — ABNORMAL LOW (ref 39.0–52.0)
Hemoglobin: 10.9 g/dL — ABNORMAL LOW (ref 13.0–17.0)
MCH: 29.8 pg (ref 26.0–34.0)
MCHC: 32.2 g/dL (ref 30.0–36.0)
MCV: 92.6 fL (ref 80.0–100.0)
Platelets: 138 10*3/uL — ABNORMAL LOW (ref 150–400)
RBC: 3.66 MIL/uL — ABNORMAL LOW (ref 4.22–5.81)
RDW: 13.5 % (ref 11.5–15.5)
WBC: 4.5 10*3/uL (ref 4.0–10.5)
nRBC: 0 % (ref 0.0–0.2)

## 2020-03-18 LAB — BASIC METABOLIC PANEL
Anion gap: 9 (ref 5–15)
BUN: 22 mg/dL (ref 8–23)
CO2: 26 mmol/L (ref 22–32)
Calcium: 9.1 mg/dL (ref 8.9–10.3)
Chloride: 103 mmol/L (ref 98–111)
Creatinine, Ser: 1.56 mg/dL — ABNORMAL HIGH (ref 0.61–1.24)
GFR calc Af Amer: 51 mL/min — ABNORMAL LOW (ref >60)
GFR calc non Af Amer: 44 mL/min — ABNORMAL LOW (ref >60)
Glucose, Bld: 250 mg/dL — ABNORMAL HIGH (ref 70–99)
Potassium: 4 mmol/L (ref 3.5–5.1)
Sodium: 138 mmol/L (ref 135–145)

## 2020-03-18 LAB — COOXEMETRY PANEL
Carboxyhemoglobin: 1.3 % (ref 0.5–1.5)
Methemoglobin: 0.7 % (ref 0.0–1.5)
O2 Saturation: 64.5 %
Total hemoglobin: 10.9 g/dL — ABNORMAL LOW (ref 12.0–16.0)

## 2020-03-18 LAB — HEMOGLOBIN A1C
Hgb A1c MFr Bld: 10.5 % — ABNORMAL HIGH (ref 4.8–5.6)
Mean Plasma Glucose: 254.65 mg/dL

## 2020-03-18 LAB — HEPATITIS B SURFACE ANTIBODY, QUANTITATIVE: Hep B S AB Quant (Post): <3.1 m[IU]/mL — ABNORMAL LOW (ref >9.9)

## 2020-03-18 SURGERY — MULTIPLE EXTRACTION WITH ALVEOLOPLASTY
Anesthesia: Monitor Anesthesia Care | Site: Mouth

## 2020-03-18 MED ORDER — AMINOCAPROIC ACID SOLUTION 5% (50 MG/ML)
ORAL | Status: DC | PRN
  Administered 2020-03-18: 10 mL via ORAL

## 2020-03-18 MED ORDER — FENTANYL CITRATE (PF) 250 MCG/5ML IJ SOLN
INTRAMUSCULAR | Status: AC
  Filled 2020-03-18: qty 5

## 2020-03-18 MED ORDER — 0.9 % SODIUM CHLORIDE (POUR BTL) OPTIME
TOPICAL | Status: DC | PRN
  Administered 2020-03-18: 1000 mL

## 2020-03-18 MED ORDER — FUROSEMIDE 10 MG/ML IJ SOLN
40.0000 mg | Freq: Once | INTRAMUSCULAR | Status: AC
  Administered 2020-03-18: 40 mg via INTRAVENOUS
  Filled 2020-03-18: qty 4

## 2020-03-18 MED ORDER — ONDANSETRON HCL 4 MG/2ML IJ SOLN
INTRAMUSCULAR | Status: DC | PRN
  Administered 2020-03-18: 4 mg via INTRAVENOUS

## 2020-03-18 MED ORDER — FENTANYL CITRATE (PF) 100 MCG/2ML IJ SOLN
INTRAMUSCULAR | Status: DC | PRN
  Administered 2020-03-18 (×2): 25 ug via INTRAVENOUS

## 2020-03-18 MED ORDER — MIDAZOLAM HCL 5 MG/5ML IJ SOLN
INTRAMUSCULAR | Status: DC | PRN
  Administered 2020-03-18: .5 mg via INTRAVENOUS

## 2020-03-18 MED ORDER — THROMBIN 5000 UNITS EX SOLR
CUTANEOUS | Status: AC
  Filled 2020-03-18: qty 5000

## 2020-03-18 MED ORDER — LIDOCAINE-EPINEPHRINE 2 %-1:100000 IJ SOLN
INTRAMUSCULAR | Status: DC | PRN
  Administered 2020-03-18: 1.7 mL via INTRADERMAL

## 2020-03-18 MED ORDER — MIDAZOLAM HCL 2 MG/2ML IJ SOLN
INTRAMUSCULAR | Status: AC
  Filled 2020-03-18: qty 2

## 2020-03-18 MED ORDER — HYDROCODONE-ACETAMINOPHEN 5-325 MG PO TABS
1.0000 | ORAL_TABLET | Freq: Four times a day (QID) | ORAL | Status: DC | PRN
  Administered 2020-03-18: 1 via ORAL
  Administered 2020-03-22: 05:00:00 2 via ORAL
  Filled 2020-03-18: qty 2
  Filled 2020-03-18: qty 1

## 2020-03-18 MED ORDER — LIDOCAINE-EPINEPHRINE 2 %-1:100000 IJ SOLN
INTRAMUSCULAR | Status: AC
  Filled 2020-03-18: qty 10.2

## 2020-03-18 MED ORDER — SODIUM CHLORIDE 0.9 % IV SOLN: INTRAVENOUS | Status: DC

## 2020-03-18 MED ORDER — THROMBIN 5000 UNITS EX KIT
PACK | CUTANEOUS | Status: DC | PRN
  Administered 2020-03-18: 5000 [IU] via TOPICAL

## 2020-03-18 MED ORDER — PROPOFOL 10 MG/ML IV BOLUS
INTRAVENOUS | Status: AC
  Filled 2020-03-18: qty 40

## 2020-03-18 MED ORDER — POTASSIUM CHLORIDE CRYS ER 20 MEQ PO TBCR
40.0000 meq | EXTENDED_RELEASE_TABLET | Freq: Once | ORAL | Status: AC
  Administered 2020-03-18: 40 meq via ORAL
  Filled 2020-03-18: qty 2

## 2020-03-18 MED ORDER — BUPIVACAINE-EPINEPHRINE (PF) 0.5% -1:200000 IJ SOLN
INTRAMUSCULAR | Status: AC
  Filled 2020-03-18: qty 3.6

## 2020-03-18 MED ORDER — HEPARIN (PORCINE) 25000 UT/250ML-% IV SOLN
1250.0000 [IU]/h | INTRAVENOUS | Status: DC
  Administered 2020-03-18: 1100 [IU]/h via INTRAVENOUS
  Filled 2020-03-18: qty 250

## 2020-03-18 MED ORDER — INSULIN GLARGINE 100 UNIT/ML ~~LOC~~ SOLN
10.0000 [IU] | Freq: Every day | SUBCUTANEOUS | Status: DC
  Administered 2020-03-18: 10 [IU] via SUBCUTANEOUS
  Filled 2020-03-18 (×2): qty 0.1

## 2020-03-18 MED ORDER — AMINOCAPROIC ACID SOLUTION 5% (50 MG/ML)
10.0000 mL | ORAL | Status: AC
  Administered 2020-03-18 (×4): 10 mL via ORAL
  Filled 2020-03-18 (×6): qty 100

## 2020-03-18 MED ORDER — BUPIVACAINE-EPINEPHRINE 0.5% -1:200000 IJ SOLN
INTRAMUSCULAR | Status: DC | PRN
  Administered 2020-03-18 (×2): 1.8 mL

## 2020-03-18 MED ORDER — OXYMETAZOLINE HCL 0.05 % NA SOLN
NASAL | Status: AC
  Filled 2020-03-18: qty 30

## 2020-03-18 MED ORDER — SODIUM CHLORIDE 0.9 % IV SOLN: INTRAVENOUS | Status: DC | PRN

## 2020-03-18 SURGICAL SUPPLY — 44 items
ALCOHOL 70% 16 OZ (MISCELLANEOUS) ×3 IMPLANT
ATTRACTOMAT 16X20 MAGNETIC DRP (DRAPES) ×3 IMPLANT
BANDAGE HEMOSTAT MRDH 4X4 STRL (MISCELLANEOUS) IMPLANT
BLADE SURG 15 STRL LF DISP TIS (BLADE) ×2 IMPLANT
BLADE SURG 15 STRL SS (BLADE) ×4
BNDG HEMOSTAT MRDH 4X4 STRL (MISCELLANEOUS)
COVER SURGICAL LIGHT HANDLE (MISCELLANEOUS) ×3 IMPLANT
COVER WAND RF STERILE (DRAPES) ×3 IMPLANT
GAUZE 4X4 16PLY RFD (DISPOSABLE) ×3 IMPLANT
GAUZE PACKING FOLDED 2  STR (GAUZE/BANDAGES/DRESSINGS) ×2
GAUZE PACKING FOLDED 2 STR (GAUZE/BANDAGES/DRESSINGS) ×1 IMPLANT
GLOVE BIO SURGEON STRL SZ 6.5 (GLOVE) ×2 IMPLANT
GLOVE BIO SURGEONS STRL SZ 6.5 (GLOVE) ×1
GLOVE SURG ORTHO 8.0 STRL STRW (GLOVE) ×3 IMPLANT
GOWN STRL REUS W/ TWL LRG LVL3 (GOWN DISPOSABLE) ×1 IMPLANT
GOWN STRL REUS W/TWL 2XL LVL3 (GOWN DISPOSABLE) ×3 IMPLANT
GOWN STRL REUS W/TWL LRG LVL3 (GOWN DISPOSABLE) ×2
HEMOSTAT SURGICEL 2X14 (HEMOSTASIS) IMPLANT
KIT BASIN OR (CUSTOM PROCEDURE TRAY) ×3 IMPLANT
KIT TURNOVER KIT B (KITS) ×3 IMPLANT
MANIFOLD NEPTUNE II (INSTRUMENTS) ×3 IMPLANT
NDL BLUNT 16X1.5 OR ONLY (NEEDLE) IMPLANT
NDL DENTAL 27 LONG (NEEDLE) IMPLANT
NEEDLE BLUNT 16X1.5 OR ONLY (NEEDLE) IMPLANT
NEEDLE DENTAL 27 LONG (NEEDLE) IMPLANT
NS IRRIG 1000ML POUR BTL (IV SOLUTION) ×3 IMPLANT
PACK EENT II TURBAN DRAPE (CUSTOM PROCEDURE TRAY) ×3 IMPLANT
PAD ARMBOARD 7.5X6 YLW CONV (MISCELLANEOUS) ×3 IMPLANT
SPONGE SURGIFOAM ABS GEL 100 (HEMOSTASIS) IMPLANT
SPONGE SURGIFOAM ABS GEL 12-7 (HEMOSTASIS) IMPLANT
SPONGE SURGIFOAM ABS GEL SZ50 (HEMOSTASIS) IMPLANT
SUCTION FRAZIER HANDLE 10FR (MISCELLANEOUS) ×2
SUCTION TUBE FRAZIER 10FR DISP (MISCELLANEOUS) ×1 IMPLANT
SUT CHROMIC 3 0 PS 2 (SUTURE) ×6 IMPLANT
SUT CHROMIC 4 0 P 3 18 (SUTURE) IMPLANT
SUT SILK 2 0 (SUTURE) ×2
SUT SILK 2-0 18XBRD TIE 12 (SUTURE) IMPLANT
SYR 50ML SLIP (SYRINGE) ×3 IMPLANT
TOWEL GREEN STERILE (TOWEL DISPOSABLE) ×3 IMPLANT
TUBE CONNECTING 12'X1/4 (SUCTIONS) ×1
TUBE CONNECTING 12X1/4 (SUCTIONS) ×2 IMPLANT
WATER STERILE IRR 1000ML POUR (IV SOLUTION) ×3 IMPLANT
WATER TABLETS ICX (MISCELLANEOUS) ×3 IMPLANT
YANKAUER SUCT BULB TIP NO VENT (SUCTIONS) ×3 IMPLANT

## 2020-03-18 NOTE — Plan of Care (Signed)
  Problem: Cardiovascular: Goal: Ability to achieve and maintain adequate cardiovascular perfusion will improve Outcome: Progressing Goal: Vascular access site(s) Level 0-1 will be maintained Outcome: Progressing   Problem: Health Behavior/Discharge Planning: Goal: Ability to safely manage health-related needs after discharge will improve Outcome: Progressing   Problem: Education: Goal: Understanding of CV disease, CV risk reduction, and recovery process will improve Outcome: Progressing   Problem: Activity: Goal: Ability to return to baseline activity level will improve Outcome: Progressing

## 2020-03-18 NOTE — Progress Notes (Signed)
MCS EDUCATION NOTE:                VAD evaluation consent reviewed and signed by the patient Mr. Vivero, designated caregiver not available.  Initial VAD teaching completed with pt and caregiver.   VAD educational packet including "Understanding Your Options with Advanced Heart Failure", "[place] Patient Agreement for VAD Evaluation and Potential Implantation" consent, and [facility] "Living a More Active Life" HM III booklet", "[place] HM III Patient Education", "[place] Mechanical Circulatory Support Program", and "Decision Aids for Left Ventricular Assist Device" reviewed in detail and left at bedside for continued reference.  All questions answered regarding VAD implant, hospital stay, and what to expect when discharged home living with a heart pump. Pt identified [name]-his step daughter as his primary caregiver, at this time pt does not have a back-up caregiver. Explained need for 24/7 care when pt is discharged home due to sternal precautions, adaptation to living on support, emotional support, consistent and meticulous exit site care and management, medication adherence and high volume of follow up visits with the [facility] after discharge; both pt and caregiver verbalized understanding of above.   Explained that LVAD can be implanted for two indications in the setting of advanced left ventricular heart failure treatment:  1. Bridge to transplant - used for patients who cannot safely wait for heart transplant without this device.  Or    2. Destination therapy - used for patients until end of life or recovery of heart function.  Patient and caregiver(s) acknowledge that the indication at this point in time for LVAD therapy would be for DT due to age.   Provided brief equipment overview and demonstration with HeartMate III training loop including discussion on the following:   a) power module   b) system controller   c) universal Charity fundraiser   d) battery clips   e)  Batteries   f)  Perc lock   g) Percutaneous lead   Demonstrated and discussed:  a) changing power source on system controller from tethered (MPU) to untethered (battery) mode   b) changing power source on system controller from untethered (battery) to tethered (MPU) mode   c) how to monitor battery life both on the system controller and on each individual battery   d) changing batteries   Reviewed and supplied a copy of home inspection check list stressing that only three pronged grounded power outlets can be used for VAD equipment. Mr. Mangino confirmed home has electrical outlets that will support the equipment along with access working telephone.  Identified the following lifestyle modifications while living on MCS:    1. No driving for at least three months and then only if doctor gives permission to do so.   2. No tub baths while pump implanted, and shower only when doctor gives permission.   3. No swimming or submersion in water while implanted with pump.   4. No contact sports or engaging in jumping activities.   5. Always have a backup controller, charged spare batteries, and battery clips nearby at all times in case of emergency.   6. Call the doctor or hospital contact person if any change in how the pump sounds, feels, or works.   7. Plan to sleep only when connected to the power module.   8. Do not sleep on your stomach.   9. Keep a backup system controller, charged batteries, battery clips, and flashlight near you during sleep in case of electrical power outage.  10. Exit site care including dressing changes, monitoring for infection, and importance of keeping percutaneous lead stabilized at all times.     Will extend the option to have one of our current patients and caregiver(s) come to talk with them about living on support to assist with decision making.   Reviewed pictures of VAD drive line, site care, dressing changes, and drive line stabilization including  securement attachment device and abdominal binder. Discussed with pt and family that they will be required to purchase dressing supplies as long as patient has the VAD in place.   Reinforced need for 24 hour/7 day week caregivers. He will also need to abide by sternal precautions with no lifting >10lbs, pushing, pulling and will need assistance with adapting to new life style with VAD equipment and care.   Intermacs patient survival statistics through September 2020 reviewed with patient and caregiver as follows:                                                The patient understands that from this discussion it does not mean that they will receive the device, but that depends on an extensive evaluation process. The patient is aware of the fact that if at anytime they want to stop the evaluation process they can.  All questions have been answered at this time and contact information was provided should they encounter any further questions.  They are both agreeable at this time to the evaluation process and will move forward.    Total Session Time: 30 minutes  Tanda Rockers, RN VAD Coordinator   Office: [phone] 24/7 VAD Pager: [phone]

## 2020-03-18 NOTE — Anesthesia Postprocedure Evaluation (Addendum)
Anesthesia Post Note  Patient: [name] [name]  Procedure(s) Performed: Extraction of tooth #'s 20,21,22 and 23 with alveoloplasaty (N/A Mouth)     Patient location during evaluation: ICU Anesthesia Type: MAC Level of consciousness: awake and alert, oriented and patient cooperative Pain management: pain level controlled Vital Signs Assessment: post-procedure vital signs reviewed and stable Respiratory status: spontaneous breathing, nonlabored ventilation, respiratory function stable and patient connected to nasal cannula oxygen Cardiovascular status: stable (remains on Milrinone support) Postop Assessment: no apparent nausea or vomiting Anesthetic complications: no    Last Vitals:  Vitals:   03/25/2020 0700 04/10/2020 0800  BP: 128/78   Pulse: 80 74  Resp: 12 11  Temp: (!) 35.9 C (!) 35.9 C  SpO2: 96% 94%    Last Pain:  Vitals:   03/14/2020 0400  TempSrc: Core  PainSc: 0-No pain                 Mohmed Farver,E. Roderic Lammert

## 2020-03-18 NOTE — Progress Notes (Signed)
PRE-OPERATIVE NOTE:  03/15/2020 [name] [name] [id number]  VITALS: BP 128/78   Pulse 74   Temp (!) 96.6 F (35.9 C)   Resp 11   Ht 6' (1.829 m)   Wt 108.1 kg   SpO2 94%   BMI 32.32 kg/m   Lab Results  Component Value Date   WBC 4.5 04/10/2020   HGB 10.9 (L) 03/14/2020   HCT 33.9 (L) 03/26/2020   MCV 92.6 03/16/2020   PLT 138 (L) 04/05/2020   BMET    Component Value Date/Time   NA 138 04/07/2020 0352   NA 132 (L) 03/07/2020 1501   K 4.0 03/22/2020 0352   CL 103 04/11/2020 0352   CO2 26 03/19/2020 0352   GLUCOSE 250 (H) 04/02/2020 0352   BUN 22 04/11/2020 0352   BUN 16 03/07/2020 1501   CREATININE 1.56 (H) 04/03/2020 0352   CREATININE 1.29 (H) 12/10/2016 1011   CALCIUM 9.1 03/21/2020 0352   GFRNONAA 44 (L) 03/25/2020 0352   GFRNONAA 57 (L) 01/28/2015 1558   GFRAA 51 (L) 03/21/2020 0352   GFRAA 66 01/28/2015 1558    Lab Results  Component Value Date   INR 1.3 (H) 04/07/2020   INR 1.3 (H) 01/07/2020   INR 1.3 (H) 01/06/2020   No results found for: PTT   [name] [name] presents for extraction of remaining lower teeth with alveoloplasty in the operating room.   SUBJECTIVE: The patient denies any acute medical or dental changes and agrees to proceed with treatment as planned.  EXAM: No sign of acute dental changes.  ASSESSMENT: Patient is affected by chronic periodontitis and loose teeth.  PLAN: Patient agrees to proceed with treatment as planned in the operating room as previously discussed and accepts the risks, benefits, and complications of the proposed treatment. Patient is aware of the risk for bleeding, bruising, swelling, infection, pain, nerve damage, soft tissue damage, root tip fracture, mandible fracture, and the risks of complications associated with the anesthesia. Patient also is aware of the potential for other complications up to and including death due to his overall cardiovasular and medical compromise.   Lenn Cal,  DDS

## 2020-03-18 NOTE — Anesthesia Preprocedure Evaluation (Addendum)
Anesthesia Evaluation  Patient identified by MRN, date of birth, ID band Patient awake    Reviewed: Allergy & Precautions, NPO status , Patient's Chart, lab work & pertinent test results, reviewed documented beta blocker date and time   History of Anesthesia Complications Negative for: history of anesthetic complications  Airway Mallampati: II  TM Distance: >3 FB Neck ROM: Full    Dental  (+) Edentulous Upper, Missing, Poor Dentition, Loose   Pulmonary shortness of breath,  03/05/2020 SARS coronavirus NEG Oval Linsey)   Pulmonary exam normal        Cardiovascular hypertension, Pt. on medications and Pt. on home beta blockers + angina + CAD (severe 3v ASCAD.  Initially plan for CABG but given need for re-do sternotomy, relatively poor targets and longstanding low EF suspect VAD may be better option if RV can tolerate and creatinine improvs) and + Cardiac Stents  + dysrhythmias Atrial Fibrillation + Cardiac Defibrillator + Valvular Problems/Murmurs (severe MR, s/p MV repair) MR  Rhythm:Irregular Rate:Normal + Systolic murmurs 0/06/1218 ECHO: Global hypokinesis with akinesis of the inferolateral wall; overall severe LV dysfunction, EF <20%; elevated LV filling pressure; mild LVH; mild LVE;  severe MR; biatrial enlargement; mild RV dysfunction; mild TR with moderate pulmonary hypertension.      Neuro/Psych Anxiety Depression Bipolar Disorder negative neurological ROS     GI/Hepatic Neg liver ROS, GERD  Medicated and Controlled,diarrhea   Endo/Other  diabetes (glu 269), Insulin DependentMorbid obesity  Renal/GU Renal InsufficiencyRenal disease (creat 1.56)     Musculoskeletal   Abdominal   Peds  Hematology eliquis   Anesthesia Other Findings   Reproductive/Obstetrics                           Anesthesia Physical Anesthesia Plan  ASA: IV  Anesthesia Plan: MAC   Post-op Pain Management:     Induction:   PONV Risk Score and Plan: 1 and Ondansetron and Treatment may vary due to age or medical condition  Airway Management Planned: Natural Airway and Nasal Cannula  Additional Equipment:   Intra-op Plan:   Post-operative Plan:   Informed Consent: I have reviewed the patients History and Physical, chart, labs and discussed the procedure including the risks, benefits and alternatives for the proposed anesthesia with the patient or authorized representative who has indicated his/her understanding and acceptance.       Plan Discussed with: CRNA and Surgeon  Anesthesia Plan Comments:        Anesthesia Quick Evaluation

## 2020-03-18 NOTE — Progress Notes (Addendum)
Advanced Heart Failure Rounding Note   Subjective:    Swan placed 4/3. Initial co-ox 39%  Now on milrinone 0.25. Off NE.  Co-ox 65%   Just returned from getting teeth extracted. Jaw swollen. No significant pain currently.   Swan#s this am: CVP 12 PA 63/37 (49) PCWP 26 Thermo 4.54/1.98 (CI by FICK calculation 2.64L/min/m2) PaPI 2.16    Objective:   Weight Range:  Vital Signs:   Temp:  [96.6 F (35.9 C)-98.4 F (36.9 C)] 96.6 F (35.9 C) (04/06 0800) Pulse Rate:  [69-159] 74 (04/06 0800) Resp:  [11-25] 11 (04/06 0800) BP: (119-148)/(66-109) 128/78 (04/06 0700) SpO2:  [93 %-99 %] 94 % (04/06 0800) Weight:  [108.1 kg] 108.1 kg (04/06 0400) Last BM Date: 03/26/2020  Weight change: Filed Weights   03/16/20 0500 03/21/2020 0459 04/09/2020 0400  Weight: 112.7 kg 110.1 kg 108.1 kg    Intake/Output:   Intake/Output Summary (Last 24 hours) at 03/14/2020 1047 Last data filed at 04/06/2020 1021 Gross per 24 hour  Intake 1000.95 ml  Output 1355 ml  Net -354.05 ml     Physical Exam: CVP 12  General:  WM sitting up in bed. Left jaw swollen. No resp difficulty HEENT: normal no remaining teeth (s/p extraction) Neck: supple. + RIJ swan, JVD elevated to level of jaw. Carotids 2+ bilat; no bruits. No lymphadenopathy or thryomegaly appreciated. Cor: PMI nondisplaced. Irregular irregular rhythm, regular rate. No rubs, gallops or murmurs. Lungs: clear, no wheezing  Abdomen: soft, nontender, nondistended. No hepatosplenomegaly. No bruits or masses. Good bowel sounds. Extremities: no cyanosis, clubbing, rash, edema Neuro: alert & orientedx3, cranial nerves grossly intact. moves all 4 extremities w/o difficulty. Affect pleasant   Telemetry: AF mid 80s Personally reviewed   Labs: Basic Metabolic Panel: Recent Labs  Lab 03/14/20 1219 03/14/20 1219 03/15/20 0311 03/15/20 0311 03/16/20 0433 03/24/2020 0159 03/13/2020 0352  NA 137  --  139  --  137 136 138  K 3.6  --  3.2*  --   3.7 3.6 4.0  CL 100  --  102  --  104 101 103  CO2 24  --  24  --  22 26 26   GLUCOSE 191*  --  152*  --  232* 260* 250*  BUN 31*  --  33*  --  34* 29* 22  CREATININE 2.17*  --  2.22*  --  2.03* 2.10* 1.56*  CALCIUM 8.6*   < > 8.9   < > 8.5* 8.7* 9.1   < > = values in this interval not displayed.    Liver Function Tests: Recent Labs  Lab 03/15/20 0311 03/24/2020 0352  AST 21 16  ALT 21 18  ALKPHOS 72 64  BILITOT 1.0 0.9  PROT 5.3* 5.8*  ALBUMIN 3.2* 3.3*   No results for input(s): LIPASE, AMYLASE in the last 168 hours. No results for input(s): AMMONIA in the last 168 hours.  CBC: Recent Labs  Lab 02/21/2020 1434 03/13/20 0331 03/15/20 0311 03/30/2020 0159 03/14/2020 0352  WBC  --  5.1 4.2 5.6 4.5  HGB 12.6* 12.5* 12.0* 11.5* 10.9*  HCT 37.0* 37.6* 36.3* 34.6* 33.9*  MCV  --  92.2 92.8 92.0 92.6  PLT  --  165 151 160 138*    Cardiac Enzymes: No results for input(s): CKTOTAL, CKMB, CKMBINDEX, TROPONINI in the last 168 hours.  BNP: BNP (last 3 results) Recent Labs    01/04/20 1642  BNP 165.2*    ProBNP (last 3 results)  No results for input(s): PROBNP in the last 8760 hours.    Other results:  Imaging: CT ABDOMEN PELVIS WO CONTRAST  Result Date: 03/16/2020 CLINICAL DATA:  69 year old male with family history of colon cancer. Screening. EXAM: CT ABDOMEN AND PELVIS WITHOUT CONTRAST TECHNIQUE: Multidetector CT imaging of the abdomen and pelvis was performed following the standard protocol without IV contrast. COMPARISON:  None. FINDINGS: Evaluation of this exam is limited in the absence of intravenous contrast. Lower chest: Probable trace left pleural effusion versus pleural thickening. There are bibasilar atelectasis/scarring. Cardiomegaly. Multi vessel coronary vascular calcification and cardiac pacemaker wires. No intra-abdominal free air. Small perihepatic ascites. Hepatobiliary: Morphologic changes of early cirrhosis. No intrahepatic biliary ductal dilatation. There  are multiple stones within the gallbladder. No pericholecystic fluid or evidence of acute cholecystitis by CT. Pancreas: Mild pancreatic atrophy. No active inflammation. Spleen: Normal in size without focal abnormality. Adrenals/Urinary Tract: The adrenal glands are unremarkable. There is no hydronephrosis or nephrolithiasis. The visualized ureters and urinary bladder appear unremarkable. Stomach/Bowel: Small hiatal hernia. There is no bowel obstruction or active inflammation. The appendix is unremarkable. Vascular/Lymphatic: Advanced aortoiliac atherosclerotic disease. The IVC is grossly unremarkable on this noncontrast CT. No portal venous gas. There is no adenopathy. Reproductive: The prostate and seminal vesicles are grossly unremarkable. Other: Small fat containing bilateral inguinal hernias. Compression noted over the right groin. Musculoskeletal: Osteopenia with degenerative changes of the spine. No acute osseous pathology. IMPRESSION: 1. No acute intra-abdominal or pelvic pathology. No bowel obstruction. Normal appendix. 2. Cirrhosis with small perihepatic ascites. 3. Cholelithiasis. No evidence of acute cholecystitis by CT. 4. Aortic Atherosclerosis (ICD10-I70.0). Electronically Signed   By: Anner Crete M.D.   On: 03/19/2020 17:16   DG Orthopantogram  Result Date: 04/08/2020 CLINICAL DATA:  Evaluate for possible periapical abscess EXAM: ORTHOPANTOGRAM/PANORAMIC COMPARISON:  None. FINDINGS: Panoramic view of the mandible demonstrates no acute fracture or dislocation. The patient is almost edentulous with only 4 residual teeth in the mandible on the left. No periapical abscess is noted. No significant dental caries are seen. No other focal abnormality is noted. IMPRESSION: No definitive abscess is noted. Electronically Signed   By: Inez Catalina M.D.   On: 03/14/2020 14:25   US RENAL  Result Date: 04/10/2020 CLINICAL DATA:  Acute kidney injury EXAM: RENAL / URINARY TRACT ULTRASOUND COMPLETE  COMPARISON:  None. FINDINGS: Right Kidney: Renal measurements: 11.2 x 4.6 x 5.6 cm = volume: 151 mL. Increased cortical echogenicity. No mass or hydronephrosis visualized. Left Kidney: Renal measurements: 10.7 x 6.1 x 6.1 cm = volume: 208 mL. Increased cortical echogenicity. No mass or hydronephrosis visualized. Bladder: Appears normal for degree of bladder distention. Other: None. IMPRESSION: Normal size of the kidneys. No hydronephrosis. Increased cortical echogenicity, in keeping with medical renal disease. Electronically Signed   By: Eddie Candle M.D.   On: 03/31/2020 11:10     Medications:     Scheduled Medications: . aminocaproic acid  10 mL Oral Q1H  . amiodarone  200 mg Oral Daily  . aspirin  81 mg Oral Daily  . atorvastatin  80 mg Oral q1800  . Chlorhexidine Gluconate Cloth  6 each Topical Daily  . citalopram  20 mg Oral Daily  . influenza vaccine adjuvanted  0.5 mL Intramuscular Tomorrow-1000  . insulin aspart  0-20 Units Subcutaneous TID WC  . insulin aspart  0-5 Units Subcutaneous QHS  . insulin glargine  20 Units Subcutaneous Daily  . pantoprazole  40 mg Oral Daily  . sodium chloride  flush  10-40 mL Intracatheter Q12H  . tamsulosin  0.4 mg Oral Daily    Infusions: . milrinone 0.25 mcg/kg/min (03/15/2020 1010)  . norepinephrine (LEVOPHED) Adult infusion Stopped (03/16/20 1657)    PRN Medications: acetaminophen, ALPRAZolam, nitroGLYCERIN, ondansetron (ZOFRAN) IV, sodium chloride flush   Assessment/Plan:   1. Acute on chronic systolic HF -> cardiogenic shock  - Echo 2016 EF 30-35% - Echo 1/21 EF 25% - Echo 4/21 EF 10-15% moderate RV dysfunction - cath 3/21 with severe 3v CAD and low output with CI 1.7  - now with AKI with attempts at diuresis.  - hold b-blocker - Swan placed 4/3. Initial co-ox 39%. Now on milrinone 0.25. Co-ox 65%  - Initial PAPi 1.5. PAPi today 2.16 on milrinone  - Initially plan for CABG but given need for re-do sternotomy, relatively poor targets  and longstanding low EF suspect VAD may be better option if RV can tolerate and creatinine improvs.  - S/p teeth extractions today by Dr. Enrique Sack. Appreciate his assistance - Main issue now for VAD will be renal function. Appears to be improving  - Will discuss potential [facility] at [facility] today.  - CVP 12. PAWP 26. Give IV Lasix 40 mg x 1. Follow SCr   2. CAD with unstable angina - s/p previous PCI - cath 03/10/2020 with severe 3v CAD - No s/s angina. Plan as above - continue ASA/statin  3. AKI on CKD 3a - baseline creatinine 1.3-1.5 - up to 2.2. suspect cardio renal - Improving w/ hemodynamic support, down to 1.56 today  - Renal u/s 4/5 unremarkable   4. Permanent AF - rate controlled. On Eliquis at home - Continue heparin. No bleeding - Discussed dosing with PharmD personally.  5. MVP s/p MV repair - now with recurrent severe MR on echo and with huge v-waves on PCWP tracing - plan VAD  6. DM2, poorly controlled - hgbA1c 11.1% - continue insulin and SSI    Length of Stay: [place] PA-C  03/31/2020, 10:47 AM  Advanced Heart Failure Team Pager [phone] (M-F; [place])  Please contact [facility] for night-coverage after hours (4p -7a ) and weekends on [email or web address]  Agree with above.  Underwent teeth extraction today.   Remains on milrinone for inotropic support.Hemodynamics much improved. Now out of shock. MV sat 65%. RV indices improved. PaPI 2.2. Creatinine down to 1.5  General:  Sitting up in bed  HEENT: normal x for localized swelling from extraction Neck: supple. RIJ swan Carotids 2+ bilat; no bruits. No lymphadenopathy or thryomegaly appreciated. Cor: PMI nondisplaced.irregular rate & rhythm. 2/6 mR + s3 Lungs: clear Abdomen: soft, nontender, nondistended. No hepatosplenomegaly. No bruits or masses. Good bowel sounds. Extremities: no cyanosis, clubbing, rash, edema Neuro: alert & orientedx3, cranial nerves grossly intact. moves all 4 extremities w/o  difficulty. Affect pleasant  He remains critically ill but improved with inotropic support.   Given baseline NYHA IV symptoms, EF 15%, severe recurrent MR after previous MV repair, poor CAD targets and need for re-do sternotomy, I think the only real option for him is durable LVAD support.   I have d/w LVAD team in multidisciplinary fashion including Dr. Orvan Seen (CV surgery), our VAD coordinators and SW team. He appears to be suitable VAD candidate. Will plan for tentative VAD placement on Thursday if we can get approval.   I think we have a narrow window to support him and hopefully we can get approval. He is Intermacs-3.  CRITICAL CARE Performed  by: Glori Bickers  Total critical care time: 5 minutes  Critical care time was exclusive of separately billable procedures and treating other patients.  Critical care was necessary to treat or prevent imminent or life-threatening deterioration.  Critical care was time spent personally by me (independent of midlevel providers or residents) on the following activities: development of treatment plan with patient and/or surrogate as well as nursing, discussions with consultants, evaluation of patient's response to treatment, examination of patient, obtaining history from patient or surrogate, ordering and performing treatments and interventions, ordering and review of laboratory studies, ordering and review of radiographic studies, pulse oximetry and re-evaluation of patient's condition.   Glori Bickers, MD  4:55 PM

## 2020-03-18 NOTE — Op Note (Signed)
OPERATIVE REPORT  Patient:            [name] [name] Date of Birth:  [birth year] MRN:                [id number]   DATE OF PROCEDURE:  03/21/2020  PREOPERATIVE DIAGNOSES: 1.   Heart failure 2.  Pre-LVAD placement dental protocol 3.  Chronic periodontitis 4.  Loose teeth  POSTOPERATIVE DIAGNOSES: 1.   Heart failure 2.  Pre-LVAD placement dental protocol 3.  Chronic periodontitis 4.  Loose teeth    OPERATIONS: 1. Multiple extraction of tooth numbers 20, 21, 22, and 23. 2. 1 Quadrant of alveoloplasty   SURGEON: Lenn Cal, DDS  ASSISTANT: Molli Posey (dental assistant)  ANESTHESIA: Monitored anesthesia care per the anesthesia team.  MEDICATIONS: 1. Ancef 2 g IV prior to invasive dental procedures. 2. Local anesthesia with a total utilization of 2 carpules each containing 34 mg of lidocaine with 0.017 mg of epinephrine as well as 1 carpule each containing 9 mg of bupivacaine with 0.009 mg of epinephrine.  SPECIMENS: There are 4 teeth that were discarded.  DRAINS: None  CULTURES: None  COMPLICATIONS: None  ESTIMATED BLOOD LOSS: 25 mls mLs.  INTRAVENOUS FLUIDS: 50 mLs of normal saline solution  INDICATIONS: The patient was previously diagnosed with heart failure.  Patient with anticipated LVAD placement.  A medically necessary dental consultation was then requested to evaluate poor dentition.  The patient was examined and treatment planned for extraction remaining lower teeth numbers 20 through 23.  This treatment plan was formulated to decrease the risks and complications associated with dental infection from affecting the patient's systemic health and the anticipated LVAD procedure.  OPERATIVE FINDINGS: Patient was examined operating room number 2.  The teeth were identified for extraction. The patient was noted be affected by chronic periodontitis and loose teeth.  DESCRIPTION OF PROCEDURE: Patient was brought to the main operating room number 2.  Patient was then placed in the supine position on the operating table.  Monitored anesthesia care was then induced per the anesthesia team. The patient was then prepped and draped in the usual manner for dental medicine procedure. A timeout was performed. The patient was identified and procedures were verified. The oral cavity was then thoroughly examined with the findings as noted above. The patient was then ready for dental medicine procedure as follows:  Local anesthesia was then administered sequentially with a total utilization of 2 carpules each containing 34 mg of lidocaine with 0.017 mg of epinephrine as well as 1 carpule containing 9 mg bupivacaine with 0.009 mg of epinephrine.  At this point in time, the mandibular left quadrant was approached.  Patient was given an inferior alveolar nerve block and long buccal nerve block utilizing the bupivacaine with epinephrine.  Further infiltration was achieved utilizing the lidocaine with epinephrine.  A 15 blade incision was then made from the distal of number 18 and extended to the mesial of #24.  A surgical flap was then carefully reflected.  The remaining lower teeth were then subluxated with a series of straight elevators.  Tooth numbers 20, 21, 22, 23 were then removed with a 151 forceps without complications.  Alveoloplasty was then performed utilizing rongeurs and bone file to help achieve primary closure.  Tissues were approximated and trimmed appropriately.  The surgical site was irrigated with copious amounts sterile saline.  A piece of Surgifoam impregnated with topical thrombin was then placed in each extraction socket appropriately.  The surgical site  was then closed from the distal of #18 and extended to the mesial #24 utilizing 3-0 Chromic Gut suture in a continuous erupted suture technique x1.  3 interrupted sutures were then placed to further close surgical site utilizing the 3-0 Chromic Gut material .  At this point time, the surgical site  was irrigated with copious amounts of sterile saline. The patient was examined for complications, seeing none, the dental medicine procedure was deemed to be complete.  A series of 4 x 4 gauze impregnated with Amicar 5% rinse were placed in the mouth to aid hemostasis. The patient was then handed over to the anesthesia team for final disposition. After an appropriate amount of time, the patient was extubated and taken to the postanesthsia care unit in good condition. All counts were correct for the dental medicine procedure.  The patient is to continue the Amicar 5% rinses postoperatively.  Patient is to rinse with 10 Mls every hour for the next 10 hours in a swish and spit manner.  The heparin therapy will then be restarted by the pharmacy team with no bolus at 10:30 PM tonight if no significant oral bleeding is noted. Patient should be able to proceed with LVAD procedure on Thursday barring any unforeseen complications from the dental extractions.  Lenn Cal, DDS.

## 2020-03-18 NOTE — Progress Notes (Signed)
[place] for heparin Indication: 3v disease; CABG eval  and persistent Afib/flutter  Allergies  Allergen Reactions  . Liraglutide Nausea And Vomiting  . Lisinopril Cough    Patient Measurements: Height: 6' (182.9 cm) Weight: 108.1 kg (238 lb 5.1 oz) IBW/kg (Calculated) : 77.6 Heparin Dosing Weight: 101.2 kg  Vital Signs: Temp: 97.3 F (36.3 C) (04/06 1500) Temp Source: Core (04/06 1200) BP: 149/123 (04/06 1500) Pulse Rate: 80 (04/06 1500)  Labs: Recent Labs    03/16/20 0433 04/11/2020 0159 03/26/2020 0200 03/27/2020 1312 03/24/2020 0352  HGB  --  11.5*  --   --  10.9*  HCT  --  34.6*  --   --  33.9*  PLT  --  160  --   --  138*  APTT  --   --   --  80*  --   LABPROT  --   --   --  15.9*  --   INR  --   --   --  1.3*  --   HEPARINUNFRC 0.49  --  0.31  --   --   CREATININE 2.03* 2.10*  --   --  1.56*    Estimated Creatinine Clearance: 56 mL/min (A) (by C-G formula based on SCr of 1.56 mg/dL (H)).   Medical History: Past Medical History:  Diagnosis Date  . Anxiety   . Arthritis   . CAD (coronary artery disease)    a.  1993 s/p MI - [employer] [facility];  b. s/p BMS to LAD '00;  c. PTCA 2nd diagonal 2010;  d. 02/18/12 Cath: moderate nonobs dzs - med rx;  e.  01/2015 Cath: LM nl, LAD 40-58m ISR, 70-55m/d, d1 90p (3.0x16 Synergy DES), D2 50-60, LCX nl, OM1 50p, 19m (2.5x12 Synergy DES), RCA nl, EF 30-35%.  . Chronic combined systolic and diastolic CHF (congestive heart failure) ([place])    a. 12/2014 Echo: EF 30-35%, Gr2 DD, mod MR, sev dil LA.  . CKD (chronic kidney disease), stage III   . Depression   . ED (erectile dysfunction)   . GERD (gastroesophageal reflux disease)   . Hyperlipidemia   . Hypertension   . Ischemic cardiomyopathy    a. s/p St. [name] (Atlas) ICD implanted in [place] 2007;  b. 12/2014 Echo: Ef 30-35%.  . MVP (mitral valve prolapse)    a. s/p MV annuloplasty at [facility] 2004.  Marland Kitchen Persistent atrial fibrillation ([place])     a. noted on ICD interrogation '10 - not previously on [place] - CHA2DS2VASc = 5.  . Type II diabetes mellitus ([place])    uncontrolled    Medications:  Medications Prior to Admission  Medication Sig Dispense Refill Last Dose  . ALPRAZolam (XANAX) 0.25 MG tablet Take 0.25 mg by mouth 3 (three) times daily as needed for anxiety.    unknown  . amiodarone (PACERONE) 200 MG tablet Take 200 mg by mouth daily.    03/11/2020 at Unknown time  . apixaban (ELIQUIS) 5 MG TABS tablet Take 1 tablet (5 mg total) by mouth 2 (two) times daily. 60 tablet 1 03/11/2020 at 0600  . atorvastatin (LIPITOR) 40 MG tablet Take 1 tablet (40 mg total) by mouth daily at 6 PM. 30 tablet 0 03/10/2020 at Unknown time  . citalopram (CELEXA) 20 MG tablet Take 20 mg by mouth daily.    03/11/2020 at Unknown time  . furosemide (LASIX) 40 MG tablet Take 40 mg by mouth daily.    03/11/2020 at  Unknown time  . insulin aspart (NOVOLOG FLEXPEN) 100 UNIT/ML FlexPen Inject 6 Units into the skin 3 (three) times daily with meals. 15 mL 0 03/11/2020 at Unknown time  . Insulin Glargine (BASAGLAR KWIKPEN) 100 UNIT/ML SOPN Inject 0.5 mLs (50 Units total) into the skin daily. 15 mL 1 03/11/2020 at Unknown time  . metoprolol succinate (TOPROL-XL) 100 MG 24 hr tablet Take 1 tablet (100 mg total) by mouth daily. 90 tablet 3 03/11/2020 at 0600  . nitroGLYCERIN (NITROSTAT) 0.4 MG SL tablet Place 1 tablet (0.4 mg total) under the tongue every 5 (five) minutes x 3 doses as needed for chest pain. 10 tablet 0 unknown  . pantoprazole (PROTONIX) 40 MG tablet Take 1 tablet (40 mg total) by mouth daily. 30 tablet 1 03/11/2020 at Unknown time  . potassium chloride SA (KLOR-CON) 20 MEQ tablet Take 20 mEq by mouth daily.    03/11/2020 at Unknown time  . tamsulosin (FLOMAX) 0.4 MG CAPS capsule Take 1 capsule (0.4 mg total) by mouth daily. 30 capsule 0 03/11/2020 at Unknown time  . ALPRAZolam (XANAX) 1 MG tablet Take 1 tablet (1 mg total) by mouth 2 (two) times daily as needed  for anxiety. (Patient not taking: Reported on 02/20/2020) 10 tablet 0 Not Taking at Unknown time    Assessment: 29 YOM with atrial fibrillation on Eliquis PTA s/p R & L heart cath found to have 3v disease now being evaluated for CABG or LVAD    Heparin level previously therapeutic on heparin drip rate 1100 units/hr. CBC stable, no overt bleeding noted. Heparin off this am for teeth pull - ok per MD to restart tonight    Goal of Therapy:  Heparin level 0.3-0.7 units/ml Monitor platelets by anticoagulation protocol: Yes   Plan:  Restart  heparin gtt at 1100 units/hr - at 2230 tonight Monitor daily heparin level and CBC, s/sx bleeding   Bonnita Nasuti Pharm.D. CPP, BCPS Clinical Pharmacist [phone] 03/31/2020 4:06 PM    Please check [email or web address] for unit-specific pharmacy phone numbers.

## 2020-03-18 NOTE — Progress Notes (Signed)
Dr. Glennon Mac aware of patient having continuous diarrhea.

## 2020-03-18 NOTE — Transfer of Care (Signed)
Immediate Anesthesia Transfer of Care Note  Patient: [name] [name]  Procedure(s) Performed: Extraction of tooth #'s 20,21,22 and 23 with alveoloplasaty (N/A Mouth)  Patient Location: ICU  Anesthesia Type:MAC  Level of Consciousness: awake and alert   Airway & Oxygen Therapy: Patient Spontanous Breathing  Post-op Assessment: Report given to RN and Post -op Vital signs reviewed and stable  Post vital signs: Reviewed and stable  Last Vitals:  Vitals Value Taken Time  BP    Temp 35.9 C 03/29/2020 1037  Pulse 88 03/15/2020 1037  Resp 12 04/10/2020 1035  SpO2 97 % 03/28/2020 1037  Vitals shown include unvalidated device data.  Last Pain:  Vitals:   03/24/2020 0400  TempSrc: Core  PainSc: 0-No pain         Complications: No apparent anesthesia complications

## 2020-03-18 NOTE — Progress Notes (Signed)
LVAD Initial Psychosocial Screening  Date/Time Initiated:  04/06/2020 3pm Referral Source:  Tanda Rockers, LVAD Cooridnator Referral Reason:  LVAD Implant Source of Information:  Pt, [name] [name]- stepdaughter and chart review  Demographics Name:  [name] [name] Address:  [address] [place] [address] Home phone:  [phone]   Marital Status: widowed Faith:  Catholic Primary Language:  English DOB:  September 10, 1951  Medical & Follow-up Adherence to Medical regimen/INR checks: compliant  Medication adherence: compliant  Physician/Clinic Appointment Attendance: compliant Comments:    Advance Directives: Do you have a Living Will or Medical POA? No  Would you like to complete a Living Will and Medical POA prior to surgery?  Yes Do you have Goals of Care? Yes  Have you had a consult with the Palliative Care Team at [facility]? No  Psychological Health Appearance:  In hospital gown Mental Status:  Alert, oriented Eye Contact:  Good Thought Content:  Coherent Speech:  Logical/coherent Mood:  Appropriate  Affect:  Appropriate to circumstance Insight:  Good Judgement: Unimpaired Interaction Style:  Engaged  Family/Social Information Who lives in your home? Name:   Relationship:   [name] [name]   Stepdaughter 2yo and 15yo  stepgrandsons  Other family members/support persons in your life? Name:   Relationship:   [name] [name]   Daughter lives in [place], [place] Caregiving Needs Who is the primary caregiver? [place] status:  good Do you drive?  yes Do you work?  yes Physical Limitations:  none Do you have other care giving responsibilities?  none Contact number: [phone]  Who is the secondary caregiver? Step grandsons? Health status:   Do you drive?   Do you work?   Physical Limitations:   Do you have other care giving responsibilities?   Contact number:  Home Environment/Personal Care Do you have reliable phone service? Yes  If so, what is the number?   [phone] Do you own or rent your home? Stays with [name] [name] who rents home Number of steps into the home? 2 steps How many levels in the home? 1 level Assistive devices in the home? cane Electrical needs for LVAD (3 prong outlets)? yes Second hand smoke exposure in the home? Yes [name] [name] smokes outside the home Travel distance from [place]? 30 minutes   Community Are you active with community agencies/resources/homecare? No Agency Name: n/a Are you active in a church, synagogue, mosque or other faith based community? Yes Faith based institutions name: [employer] [facility] [place] What other sources do you have for spiritual support? no Are you active in any clubs or social organizations? Went to the senior center before covid. What do you do for fun?  Hobbies?  Interests? Playing cards, watching sports  Education/Work Information What is the last grade of school you completed?  12th grade and some college Preferred method of learning?  Hands on Do you have any problems with reading or writing?  No Are you currently employed?  No  When were you last employed? 1994  Name of employer? 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 Entire career If you are not working, do you plan to return to work after VAD surgery? No If yes, what type of employment do you hope to find? Are you interested in job training or learning new skills?  No Did you serve in the [employer]? No  If so, what branch? Other  Financial Information What is your  source of income? [employer]- [phone] Do you have difficulty meeting your monthly expenses? No If yes, which ones? Can you budget for the monthly cost for dressing supplies post procedure? Yes  Primary Health insurance:  [employer] Medicare Secondary Insurance: Prescription plan: D What are your prescription co-pays? varies Do you use mail order for your prescriptions?  No Have  you ever had to refuse medication due to cost?  No Have you applied for Medicaid?  Not in [place] Have you applied for Social Security Disability (SSI)  n/a  Medical Information Briefly describe why you are here for evaluation: Started with a MI in 1992 Do you have a PCP or other medical provider? [name] Poli, PA Are you able to complete your ADL's? yes Do you have a history of trauma, physical, emotional, or sexual abuse? denies Do you have any family history of heart problems? Yes "the males all had heart problems" Do you smoke now or past usage? never    Do you drink alcohol now or past usage? past usage    Quit date:  Early 42's Are you currently using illegal drugs or misuse of medication or past usage? past usage prescription drugs-Valium "went to rehab a few times"  Have you ever been treated for substance abuse? Yes      If yes, where and when did you receive treatment? Patient reports multiple admissions for substance abuse in the 70's and 80's. He reports he has not used since the early 80's  [place] History How have you been feeling in the past year? "been rough" Have you ever had any problems with depression, anxiety or other mental health issues? "I get depressed over my recent illness and have been in a couple of places"  Do you see a counselor, psychiatrist or therapist?  Patient admits to multiple admissions for metal health over the years. The last admission was 9 years ago at [facility] for Depression. If you are currently experiencing problems are you interested in talking with a professional? No Have you or are you taking medications for anxiety/depression or any mental health concerns?  Yes  Current Medications: Has a prescription for Xanax but has not taken recently What are your coping strategies under stressful situations? "fix me a cup of coffee and relax in the yard" Are there any other stressors in your life? No Have you had any past or  current thoughts of suicide? Patient admits to past attempts with the last attempt 20 yaers ago with an overdose of Xanax How many hours do you sleep at night? On and off How is your appetite? "Like a Horse" Would you be interested in attending the LVAD support group? Yes  PHQ2 Depression Scale: 3  Legal Do you currently have any legal issues/problems?  NO Have you had any legal issues/problems in the past?  denies   Plan for VAD Implementation Do you know and understand what happens during the VAD surgery? Patient Verbalizes Understanding  of surgery and able to describe details What do you know about the risks and side effect associated with VAD surgery? Patient Verbalizes Understanding  of risks (infection, stroke and death) Explain what will happen right after surgery: Patient Verbalizes Understanding  of OR to ICU and will be intubated What is your plan for transportation for the first 8 weeks post-surgery? (Patients are not recommended to drive post-surgery for 8 weeks)  Driver:    [name] Purpura Do you have airbags in your vehicle?  There is a  risk of discharging the device if the airbag were to deploy. What do you know about your diet post-surgery? Patient Verbalizes Understanding  of Heart healthy How do you plan to monitor your medications, current and future?   Fill pill box How do you plan to complete ADL's post-surgery?  Ask for assistance  Will it be difficult to ask for help from your caregivers?  No  Please explain what you hope will be improved about your life as a result of receiving the LVAD? More active and get around better. Please tell me your biggest concern or fear about living with the LVAD?  "Don't know how it's going to work yet" Please explain your understanding of how their body will change?  "i'll get over it" Are you worried about these changes? No Do you see any barriers to your surgery or follow-up? Patient stated "I always said I'd rather be dead than a  burden"  Understanding of LVAD Patient states understanding of the following: Surgical procedures and risks, Electrical need for LVAD (3 prong outlets), Safety precautions with LVAD (water, etc.), LVAD daily self-care (dressing changes, computer check, extra supplies), Outpatient follow up (LVAD clinic appts, monitoring blood thinners) and Need for Emergency Planning  Discussed and Reviewed with Patient and Caregiver  Patient's current level of motivation to prepare for LVAD: Patient appears motivated for surgery Patient's present Level of Consent for LVAD: Yes     Education provided to patient/family/caregiver:   Caregiver role and responsibiltiy, Financial planning for LVAD, Role of Clinical Social Worker and Signs of Depression and Anxiety   Caregiver questions  CSW spoke with [name] [name] stepdaughter: Please explain what you hope will be improved about your life and loved one's life as a result of receiving the LVAD?  Hope to move around more and enjoy the kids What is your biggest concern or fear about caregiving with an LVAD patient?  Denies any concerns What is your plan for availability to provide care 24/7 x2 weeks post op and dressing changes ongoing?   Will most likely work from home but will confirm plan with work.  Who is the relief/backup caregiver and what is their availability?  Unsure but teenage sons available to assist with supervision Preferred method of learning? Hands on  Do you drive? yes How do you handle stressful situations?  Pretty well Do you think you can do this? Yes Is there anything that concerns about caregiving?  No  Do you provide caregiving to anyone else?  No   Caregiver's current level of motivation to prepare for LVAD: Motivated for improved health  Caregiver's present level of consent for LVAD: Ready  Clinical Interventions Needed:    CSW will monitor signs and symptoms of depression and assist with adjustment to life with an LVAD.  CSW will  follow for transport needs post surgery. Patient has drivers license but no car.  CSW will refer patient for HPOA, if not completed prior to surgery if still wishing to complete.  CSW encouraged attendance with the LVAD Support Group to assist further with adjustment and post implant peer support.  Clinical Impressions/Recommendations:   Patient is a 69 yo male who is widowed 2 years ago. Patient has been married 6x and his first wife was also his 6th wife and the love of his life. He has 6 biological children and 3 stepchildren. He reports his daughter [name] [name] and stepdaughter [name] [name] are the most supportive. He reports he has been compliant with his medical follow up. He  lives with his stepdaughter [name] [name] and states the home is convenient for recovery. He enjoys playing cards and watching sports and was active with [employer] in [place]. He worked in Optometrist jobs from police, security and prison guard. He receives [place] and has [employer] for insurance. He denies any trauma or abuse. He denies any tobacco use but admits to alcohol use and substance use. He states that he went rehab for substance /alcohol use several times in the late 70's and early 80's and has not used since that time. He admits to depression and scored a 3 on the [facility]. He states that he went to [facility] multiple times for depression and was last admitted 9 years ago in [place] for depression.He states that he "fixes a cup of coffee and relax in the yard".  He states that he hopes to be more active and get around better as his goal. He denies any concerns with body image. He appears to be motivated and stated "I always said I'd rather be dead than a burden". Patient appears to be motivated and hopeful for improved health. Patient appears to be a good candidate for LVAD implantation.   [name] [name], [name] [name]

## 2020-03-18 NOTE — Progress Notes (Signed)
Dr. Enrique Sack and Dr. Linna Caprice made aware of uncontrollable diarrhea.  Patient states this has been going on since last night.

## 2020-03-18 NOTE — Consult Note (Signed)
[address]       [place] [address]             [phone]        [name] [name]  Medical Record #[id number] Date of Birth: [birth year]  Referring: No ref. provider found Primary Care: [name], PA Primary Cardiologist:Peter Martinique, MD  Chief Complaint:    Shortness of breath  History of Present Illness:     69 year old gentleman with several risk factors for cardiovascular disease including diabetes, hypertension, hyperlipidemia, and is status post mitral valve repair at [facility] in 2004.  He also has undergone cardiac defibrillator placement in 2007 and has known ischemic cardiomyopathy based this point.  Also has a history of chronic atrial fibrillation.  The patient was relatively well compensated on medical therapy until the past few weeks when he began to experience worsening shortness of breath and dyspnea on.  This was also accompanied by orthopnea.  In response, began to sleep more upright position.  When his activity level was severely reduced and could only walk a few feet, he presented to his local hospital.  At the time he was also having chest pressure.  He underwent routine battery of testing which demonstrated mild elevation of troponin.  Patient was then transferred to [facility] for further evaluation.  Upon arrival he underwent left heart catheterization demonstrated vessel coronary artery.  Coincidentally, the left ventricular ejection fraction was approximately 15%, and based on right heart catheterization numbers his cardiac index was 1.7.  Other right heart numbers include: RHC 03/03/2020 RA 13 PA 41/23 (33) PCWP 23 Fick 3.9/1.7 PaPI 1.4 RA/PCWP  0.56  2D echocardiogram demonstrated severe mitral regurgitation and biventricular dysfunction.  He was admitted to the regular ward and placed on IV heparin.  Over the next couple of days his baseline creatinine was noted to be elevated over 2.  The patient  was therefore transferred to the ICU and underwent placement of a PA catheter demonstrated severely elevated PA pressure and markedly reduced cardiac output.  He was placed on intravenous inotropes and diuretics.  In response, his hemodynamics have improved and his renal insufficiency has largely resolved.  Originally the patient was referred for CABG but upon further examination of his underlying cardiac dysfunction it is felt that durable left ventricular assist device placement would be more likely to produce a positive result.  Current Activity/ Functional Status: Patient will be independent with mobility/ambulation, transfers, ADL's, IADL's.   Zubrod Score: At the time of surgery this patient's most appropriate activity status/level should be described as: []     0    Normal activity, no symptoms [x]     1    Restricted in physical strenuous activity but ambulatory, able to do out light work []     2    Ambulatory and capable of self care, unable to do work activities, up and about                 more than 50%  Of the time                            []     3    Only limited self care, in bed greater than 50% of waking hours []     4    Completely disabled, no self care, confined to bed or chair []     5  Moribund  Past Medical History:  Diagnosis Date  . Anxiety   . Arthritis   . CAD (coronary artery disease)    a.  1993 s/p MI - [employer];  b. s/p BMS to LAD '00;  c. PTCA 2nd diagonal 2010;  d. 02/18/12 Cath: moderate nonobs dzs - med rx;  e.  01/2015 Cath: LM nl, LAD 40-16m ISR, 70-74m/d, d1 90p (3.0x16 Synergy DES), D2 50-60, LCX nl, OM1 50p, 48m (2.5x12 Synergy DES), RCA nl, EF 30-35%.  . Chronic combined systolic and diastolic CHF (congestive heart failure) ([place])    a. 12/2014 Echo: EF 30-35%, Gr2 DD, mod MR, sev dil LA.  . CKD (chronic kidney disease), stage III   . Depression   . ED (erectile dysfunction)   . GERD (gastroesophageal reflux disease)   . Hyperlipidemia   .  Hypertension   . Ischemic cardiomyopathy    a. s/p St. [name] (Atlas) ICD implanted in [place] 2007;  b. 12/2014 Echo: Ef 30-35%.  . MVP (mitral valve prolapse)    a. s/p MV annuloplasty at [facility] 2004.  Marland Kitchen Persistent atrial fibrillation ([place])    a. noted on ICD interrogation '10 - not previously on [place]o - CHA2DS2VASc = 5.  . Type II diabetes mellitus ([place])    uncontrolled    Past Surgical History:  Procedure Laterality Date  . CARDIAC DEFIBRILLATOR PLACEMENT  2007   implanted in [place], has a 7001 RV lead and a SJM Atlas ICD followed by Dr Caryl Comes  . CARDIOVERSION N/A 09/29/2016   Procedure: CARDIOVERSION;  Surgeon: Lelon Perla, MD;  Location: [facility] ENDOSCOPY;  Service: Cardiovascular;  Laterality: N/A;  . CARDIOVERSION N/A 12/03/2016   Procedure: CARDIOVERSION;  Surgeon: Dorothy Spark, MD;  Location: [place];  Service: Cardiovascular;  Laterality: N/A;  . IMPLANTABLE CARDIOVERTER DEFIBRILLATOR (ICD) GENERATOR CHANGE N/A 03/19/2013   SJM Fortify ST DR generator placed by Dr Lovena Le, part of Analyze ST study  . LEFT AND RIGHT HEART CATHETERIZATION WITH CORONARY ANGIOGRAM N/A 01/14/2015   Procedure: LEFT AND RIGHT HEART CATHETERIZATION WITH CORONARY ANGIOGRAM;  Surgeon: Peter M Martinique, MD;  Location[facility] CATH LAB;  Service: Cardiovascular;  Laterality: N/A;  . LEFT HEART CATHETERIZATION WITH CORONARY ANGIOGRAM N/A 02/17/2012   Procedure: LEFT HEART CATHETERIZATION WITH CORONARY ANGIOGRAM;  Surgeon: Jolaine Artist, MD;  Location: [facility] CATH LAB;  Service: Cardiovascular;  Laterality: N/A;  . MITRAL VALVE ANNULOPLASTY  2004   Archie Endo 02/17/2012  . RIGHT/LEFT HEART CATH AND CORONARY ANGIOGRAPHY N/A 03/11/2020   Procedure: RIGHT/LEFT HEART CATH AND CORONARY ANGIOGRAPHY;  Surgeon: Burnell Blanks, MD;  Location: [place] CV LAB;  Service: Cardiovascular;  Laterality: N/A;  . TEE WITHOUT CARDIOVERSION N/A 09/29/2016   Procedure: TRANSESOPHAGEAL ECHOCARDIOGRAM (TEE);  Surgeon:  Lelon Perla, MD;  Location: [facility] ENDOSCOPY;  Service: Cardiovascular;  Laterality: N/A;  . TEE WITHOUT CARDIOVERSION N/A 12/03/2016   Procedure: TRANSESOPHAGEAL ECHOCARDIOGRAM (TEE);  Surgeon: Dorothy Spark, MD;  Location: [facility] ENDOSCOPY;  Service: Cardiovascular;  Laterality: N/A;    Social History   Tobacco Use  Smoking Status Never Smoker  Smokeless Tobacco Never Used    Social History   Substance and Sexual Activity  Alcohol Use Yes  . Alcohol/week: 0.0 standard drinks   Comment: 01/14/2015 "used to drink alot; stopped completely in 1983"     Allergies  Allergen Reactions  . Liraglutide Nausea And Vomiting  . Lisinopril Cough    Current Facility-Administered Medications  Medication Dose Route Frequency Provider  Last Rate Last Admin  . 0.9 %  sodium chloride infusion   Intravenous Continuous Lenn Cal, DDS      . acetaminophen (TYLENOL) tablet 650 mg  650 mg Oral Q4H PRN Lenn Cal, DDS      . ALPRAZolam Duanne Moron) tablet 1 mg  1 mg Oral BID PRN Teena Dunk F, DDS      . aminocaproic acid (AMICAR) oral solution 50 mg/mL (5%), 100 ml  10 mL Oral Q1H Teena Dunk F, DDS   10 mL at 03/28/2020 1802  . amiodarone (PACERONE) tablet 200 mg  200 mg Oral Daily Teena Dunk F, DDS   200 mg at 03/13/2020 1051  . aspirin chewable tablet 81 mg  81 mg Oral Daily Lenn Cal, DDS   81 mg at 03/25/2020 1051  . atorvastatin (LIPITOR) tablet 80 mg  80 mg Oral q1800 Lenn Cal, DDS   80 mg at 04/02/2020 1802  . Chlorhexidine Gluconate Cloth 2 % PADS 6 each  6 each Topical Daily Lenn Cal, DDS   6 each at 04/07/2020 [phone]  . citalopram (CELEXA) tablet 20 mg  20 mg Oral Daily Teena Dunk F, DDS   20 mg at 04/04/2020 1051  . heparin ADULT infusion 100 units/mL (25000 units/295mL sodium chloride 0.45%)  1,100 Units/hr Intravenous Continuous Bensimhon, Shaune Pascal, MD      . HYDROcodone-acetaminophen (NORCO/VICODIN) 5-325 MG per tablet 1-2 tablet  1-2  tablet Oral Q6H PRN Lenn Cal, DDS   1 tablet at 03/13/2020 1228  . influenza vaccine adjuvanted (FLUAD) injection 0.5 mL  0.5 mL Intramuscular Tomorrow-1000 Shelva Majestic A, MD      . insulin aspart (novoLOG) injection 0-20 Units  0-20 Units Subcutaneous TID WC Lenn Cal, DDS   7 Units at 03/24/2020 1745  . insulin aspart (novoLOG) injection 0-5 Units  0-5 Units Subcutaneous QHS Teena Dunk F, DDS   3 Units at 04/07/2020 2300  . insulin glargine (LANTUS) injection 10 Units  10 Units Subcutaneous Daily Troy Sine, MD   10 Units at 04/08/2020 1420  . milrinone (PRIMACOR) 20 MG/100 ML (0.2 mg/mL) infusion  0.25 mcg/kg/min Intravenous Continuous Lenn Cal, DDS 8.3325 mL/hr at 03/16/2020 1010 0.25 mcg/kg/min at 03/24/2020 1010  . nitroGLYCERIN (NITROSTAT) SL tablet 0.4 mg  0.4 mg Sublingual Q5 Min x 3 PRN Teena Dunk F, DDS      . norepinephrine (LEVOPHED) 4mg  in 291mL premix infusion  0-40 mcg/min Intravenous Titrated Lenn Cal, DDS   Stopped at 03/16/20 1657  . ondansetron (ZOFRAN) injection 4 mg  4 mg Intravenous Q6H PRN Teena Dunk F, DDS   4 mg at 03/13/20 0840  . pantoprazole (PROTONIX) EC tablet 40 mg  40 mg Oral Daily Teena Dunk F, DDS   40 mg at 03/19/2020 1051  . sodium chloride flush (NS) 0.9 % injection 10-40 mL  10-40 mL Intracatheter Q12H Teena Dunk F, DDS   10 mL at 03/13/2020 2300  . sodium chloride flush (NS) 0.9 % injection 10-40 mL  10-40 mL Intracatheter PRN Teena Dunk F, DDS      . tamsulosin [facility]) capsule 0.4 mg  0.4 mg Oral Daily Teena Dunk F, DDS   0.4 mg at 03/21/2020 1052    Medications Prior to Admission  Medication Sig Dispense Refill Last Dose  . ALPRAZolam (XANAX) 0.25 MG tablet Take 0.25 mg by mouth 3 (three) times daily as needed for anxiety.    unknown  .  amiodarone (PACERONE) 200 MG tablet Take 200 mg by mouth daily.    03/11/2020 at Unknown time  . apixaban (ELIQUIS) 5 MG TABS tablet Take 1 tablet (5 mg  total) by mouth 2 (two) times daily. 60 tablet 1 03/11/2020 at 0600  . atorvastatin (LIPITOR) 40 MG tablet Take 1 tablet (40 mg total) by mouth daily at 6 PM. 30 tablet 0 03/10/2020 at Unknown time  . citalopram (CELEXA) 20 MG tablet Take 20 mg by mouth daily.    03/11/2020 at Unknown time  . furosemide (LASIX) 40 MG tablet Take 40 mg by mouth daily.    03/11/2020 at Unknown time  . insulin aspart (NOVOLOG FLEXPEN) 100 UNIT/ML FlexPen Inject 6 Units into the skin 3 (three) times daily with meals. 15 mL 0 03/11/2020 at Unknown time  . Insulin Glargine (BASAGLAR KWIKPEN) 100 UNIT/ML SOPN Inject 0.5 mLs (50 Units total) into the skin daily. 15 mL 1 03/11/2020 at Unknown time  . metoprolol succinate (TOPROL-XL) 100 MG 24 hr tablet Take 1 tablet (100 mg total) by mouth daily. 90 tablet 3 03/11/2020 at 0600  . nitroGLYCERIN (NITROSTAT) 0.4 MG SL tablet Place 1 tablet (0.4 mg total) under the tongue every 5 (five) minutes x 3 doses as needed for chest pain. 10 tablet 0 unknown  . pantoprazole (PROTONIX) 40 MG tablet Take 1 tablet (40 mg total) by mouth daily. 30 tablet 1 03/11/2020 at Unknown time  . potassium chloride SA (KLOR-CON) 20 MEQ tablet Take 20 mEq by mouth daily.    03/11/2020 at Unknown time  . tamsulosin (FLOMAX) 0.4 MG CAPS capsule Take 1 capsule (0.4 mg total) by mouth daily. 30 capsule 0 03/11/2020 at Unknown time  . ALPRAZolam (XANAX) 1 MG tablet Take 1 tablet (1 mg total) by mouth 2 (two) times daily as needed for anxiety. (Patient not taking: Reported on 02/21/2020) 10 tablet 0 Not Taking at Unknown time    Family History  Problem Relation Age of Onset  . Coronary artery disease Mother   . Colon cancer Mother   . Heart attack Mother   . Heart disease Mother   . Heart failure Mother   . Hypertension Mother   . Malignant hyperthermia Mother   . Colon cancer Sister   . Coronary artery disease Brother   . Heart disease Brother      Review of Systems:   ROS Constitutional: positive for  fatigue Eyes: negative, glasses Ears, nose, mouth, throat, and face: negative, h/o COVID in 2020 Respiratory: positive for Dyspnea on exertion, severe Cardiovascular: negative Gastrointestinal: negative Genitourinary:negative Hematologic/lymphatic: negative Musculoskeletal:negative Neurological: negative Behavioral/Psych: positive for History of depression Endocrine: positive for Diabetes     Cardiac Review of Systems: Y or  [    ]= no  Chest Pain [    ]  Resting SOB [   ] Exertional SOB  [  ]  Orthopnea [  ]   Pedal Edema [   ]    Palpitations [  ] Syncope  [  ]   Presyncope [   ]  General Review of Systems: [Y] = yes [  ]=no Constitional: recent weight change [  ]; anorexia [  ]; fatigue [  ]; nausea [  ]; night sweats [  ]; fever [  ]; or chills [  ]  Dental: Last Dentist visit:   Eye : blurred vision [  ]; diplopia [   ]; vision changes [  ];  Amaurosis fugax[  ]; Resp: cough [  ];  wheezing[  ];  hemoptysis[  ]; shortness of breath[  ]; paroxysmal nocturnal dyspnea[  ]; dyspnea on exertion[  ]; or orthopnea[  ];  GI:  gallstones[  ], vomiting[  ];  dysphagia[  ]; melena[  ];  hematochezia [  ]; heartburn[  ];   Hx of  Colonoscopy[  ]; GU: kidney stones [  ]; hematuria[  ];   dysuria [  ];  nocturia[  ];  history of     obstruction [  ]; urinary frequency [  ]             Skin: rash, swelling[  ];, hair loss[  ];  peripheral edema[  ];  or itching[  ]; Musculosketetal: myalgias[  ];  joint swelling[  ];  joint erythema[  ];  joint pain[  ];  back pain[  ];  Heme/Lymph: bruising[  ];  bleeding[  ];  anemia[  ];  Neuro: TIA[  ];  headaches[  ];  stroke[  ];  vertigo[  ];  seizures[  ];   paresthesias[  ];  difficulty walking[  ];  Psych:depression[  ]; anxiety[  ];  Endocrine: diabetes[  ];  thyroid dysfunction[  ];            Status post extraction                 Physical Exam: BP (!) 148/102   Pulse 92   Temp 97.7  F (36.5 C)   Resp 11   Ht 6' (1.829 m)   Wt 108.1 kg   SpO2 94%   BMI 32.32 kg/m    General appearance: alert, cooperative, appears stated age and no distress Head: Normocephalic, without obvious abnormality, atraumatic Neck: JVD - 3 cm above sternal notch, no adenopathy, no carotid bruit, no JVD, supple, symmetrical, trachea midline and thyroid not enlarged, symmetric, no tenderness/mass/nodules Lymph nodes: Cervical, supraclavicular, and axillary nodes normal. Resp: diminished breath sounds bibasilar Cardio: irregularly irregular rhythm and systolic murmur: early systolic 2/6, rumbling at lower left sternal border GI: soft, non-tender; bowel sounds normal; no masses,  no organomegaly Extremities: extremities normal, atraumatic, no cyanosis or edema Neurologic: Alert and oriented X 3, normal strength and tone. Normal symmetric reflexes. Normal coordination and gait  Diagnostic Studies & Laboratory data:     Recent Radiology Findings:   CT ABDOMEN PELVIS WO CONTRAST  Result Date: 03/23/2020 CLINICAL DATA:  69 year old male with family history of colon cancer. Screening. EXAM: CT ABDOMEN AND PELVIS WITHOUT CONTRAST TECHNIQUE: Multidetector CT imaging of the abdomen and pelvis was performed following the standard protocol without IV contrast. COMPARISON:  None. FINDINGS: Evaluation of this exam is limited in the absence of intravenous contrast. Lower chest: Probable trace left pleural effusion versus pleural thickening. There are bibasilar atelectasis/scarring. Cardiomegaly. Multi vessel coronary vascular calcification and cardiac pacemaker wires. No intra-abdominal free air. Small perihepatic ascites. Hepatobiliary: Morphologic changes of early cirrhosis. No intrahepatic biliary ductal dilatation. There are multiple stones within the gallbladder. No pericholecystic fluid or evidence of acute cholecystitis by CT. Pancreas: Mild pancreatic atrophy. No active inflammation. Spleen: Normal in size  without focal abnormality. Adrenals/Urinary Tract: The adrenal glands are unremarkable. There is no hydronephrosis or nephrolithiasis. The visualized ureters and urinary bladder appear unremarkable. Stomach/Bowel: Small hiatal hernia.  There is no bowel obstruction or active inflammation. The appendix is unremarkable. Vascular/Lymphatic: Advanced aortoiliac atherosclerotic disease. The IVC is grossly unremarkable on this noncontrast CT. No portal venous gas. There is no adenopathy. Reproductive: The prostate and seminal vesicles are grossly unremarkable. Other: Small fat containing bilateral inguinal hernias. Compression noted over the right groin. Musculoskeletal: Osteopenia with degenerative changes of the spine. No acute osseous pathology. IMPRESSION: 1. No acute intra-abdominal or pelvic pathology. No bowel obstruction. Normal appendix. 2. Cirrhosis with small perihepatic ascites. 3. Cholelithiasis. No evidence of acute cholecystitis by CT. 4. Aortic Atherosclerosis (ICD10-I70.0). Electronically Signed   By: Anner Crete M.D.   On: 03/28/2020 17:16   DG Orthopantogram  Result Date: 03/22/2020 CLINICAL DATA:  Evaluate for possible periapical abscess EXAM: ORTHOPANTOGRAM/PANORAMIC COMPARISON:  None. FINDINGS: Panoramic view of the mandible demonstrates no acute fracture or dislocation. The patient is almost edentulous with only 4 residual teeth in the mandible on the left. No periapical abscess is noted. No significant dental caries are seen. No other focal abnormality is noted. IMPRESSION: No definitive abscess is noted. Electronically Signed   By: Inez Catalina M.D.   On: 03/14/2020 14:25   US RENAL  Result Date: 04/05/2020 CLINICAL DATA:  Acute kidney injury EXAM: RENAL / URINARY TRACT ULTRASOUND COMPLETE COMPARISON:  None. FINDINGS: Right Kidney: Renal measurements: 11.2 x 4.6 x 5.6 cm = volume: 151 mL. Increased cortical echogenicity. No mass or hydronephrosis visualized. Left Kidney: Renal  measurements: 10.7 x 6.1 x 6.1 cm = volume: 208 mL. Increased cortical echogenicity. No mass or hydronephrosis visualized. Bladder: Appears normal for degree of bladder distention. Other: None. IMPRESSION: Normal size of the kidneys. No hydronephrosis. Increased cortical echogenicity, in keeping with medical renal disease. Electronically Signed   By: Eddie Candle M.D.   On: 03/29/2020 11:10     I have independently reviewed the above radiologic studies and discussed with the patient   Recent Lab Findings: Lab Results  Component Value Date   WBC 4.5 03/13/2020   HGB 10.9 (L) 03/24/2020   HCT 33.9 (L) 04/09/2020   PLT 138 (L) 04/03/2020   GLUCOSE 250 (H) 04/04/2020   CHOL 89 04/10/2020   TRIG 69 04/10/2020   HDL 33 (L) 04/02/2020   LDLCALC 42 04/07/2020   ALT 18 03/13/2020   AST 16 03/30/2020   NA 138 03/30/2020   K 4.0 04/04/2020   CL 103 04/04/2020   CREATININE 1.56 (H) 03/29/2020   BUN 22 04/03/2020   CO2 26 03/22/2020   TSH 9.134 (H) 03/28/2020   INR 1.3 (H) 03/21/2020   HGBA1C 10.5 (H) 03/15/2020      Assessment / Plan:   Very pleasant 69 year old gentleman with ischemic cardiomyopathy and is considered NYHA IV.  He requires ongoing intravenous vasoactive and inotropic for normalized hemodynamics.  Conventional treatment options for ischemic disease are considered futile at this point.  On the other hand, he appears to be a very good candidate for durable LVAD.  The patient has had the opportunity to ask questions about the which are answered to his apparent satisfaction. We would like to proceed with surgery this week.      I  spent 40 minutes counseling the patient face to face.   Naiya Corral Z. Orvan Seen, [place] 03/19/2020 6:13 PM

## 2020-03-18 NOTE — Progress Notes (Signed)
Inpatient Diabetes Program Recommendations  AACE/ADA: New Consensus Statement on Inpatient Glycemic Control (2015)  Target Ranges:  Prepandial:   less than 140 mg/dL      Peak postprandial:   less than 180 mg/dL (1-2 hours)      Critically ill patients:  140 - 180 mg/dL   Lab Results  Component Value Date   GLUCAP 135 (H) 03/31/2020   HGBA1C 10.5 (H) 04/09/2020    Review of Glycemic Control Results for CHAYIM, BIALAS (MRN [id number]) as of 04/03/2020 12:07  Ref. Range 04/07/2020 22:22 03/23/2020 06:47 03/28/2020 08:23 04/03/2020 10:59  Glucose-Capillary Latest Ref Range: 70 - 99 mg/dL 279 (H) 265 (H) 269 (H) 135 (H)   Diabetes history: DM2 Outpatient Diabetes medications: Basaglar 50 units daily, Novolog 6 units TID with meals Current orders for Inpatient glycemic control: Novolog 0-20 units TID with meals, Novolog 0-5 units QHS, Lantus 20 units QD  Inpatient Diabetes Program Recommendations:   Given surgery this AM, Lantus was held. Consider reordering Lantus 20 units QD.   Thanks, Bronson Curb, MSN, RNC-OB Diabetes Coordinator [phone] (8a-5p)

## 2020-03-18 NOTE — Anesthesia Procedure Notes (Signed)
Procedure Name: MAC Date/Time: 03/11/2020 9:35 AM Performed by: Eligha Bridegroom, CRNA Pre-anesthesia Checklist: Patient identified, Emergency Drugs available, Suction available, Patient being monitored and Timeout performed Patient Re-evaluated:Patient Re-evaluated prior to induction Oxygen Delivery Method: Nasal cannula Preoxygenation: Pre-oxygenation with 100% oxygen Induction Type: IV induction

## 2020-03-19 ENCOUNTER — Telehealth: Payer: Self-pay

## 2020-03-19 DIAGNOSIS — R570 Cardiogenic shock: Secondary | ICD-10-CM | POA: Diagnosis not present

## 2020-03-19 DIAGNOSIS — Z515 Encounter for palliative care: Secondary | ICD-10-CM

## 2020-03-19 DIAGNOSIS — Z7189 Other specified counseling: Secondary | ICD-10-CM | POA: Diagnosis not present

## 2020-03-19 DIAGNOSIS — I2510 Atherosclerotic heart disease of native coronary artery without angina pectoris: Secondary | ICD-10-CM | POA: Diagnosis not present

## 2020-03-19 LAB — COMPREHENSIVE METABOLIC PANEL
ALT: 17 U/L (ref 0–44)
AST: 20 U/L (ref 15–41)
Albumin: 3.8 g/dL (ref 3.5–5.0)
Alkaline Phosphatase: 85 U/L (ref 38–126)
Anion gap: 11 (ref 5–15)
BUN: 16 mg/dL (ref 8–23)
CO2: 27 mmol/L (ref 22–32)
Calcium: 9.3 mg/dL (ref 8.9–10.3)
Chloride: 98 mmol/L (ref 98–111)
Creatinine, Ser: 1.4 mg/dL — ABNORMAL HIGH (ref 0.61–1.24)
GFR calc Af Amer: 59 mL/min — ABNORMAL LOW (ref >60)
GFR calc non Af Amer: 51 mL/min — ABNORMAL LOW (ref >60)
Glucose, Bld: 255 mg/dL — ABNORMAL HIGH (ref 70–99)
Potassium: 3.9 mmol/L (ref 3.5–5.1)
Sodium: 136 mmol/L (ref 135–145)
Total Bilirubin: 1.7 mg/dL — ABNORMAL HIGH (ref 0.3–1.2)
Total Protein: 6.6 g/dL (ref 6.5–8.1)

## 2020-03-19 LAB — BASIC METABOLIC PANEL
Anion gap: 9 (ref 5–15)
BUN: 15 mg/dL (ref 8–23)
CO2: 27 mmol/L (ref 22–32)
Calcium: 9.1 mg/dL (ref 8.9–10.3)
Chloride: 101 mmol/L (ref 98–111)
Creatinine, Ser: 1.25 mg/dL — ABNORMAL HIGH (ref 0.61–1.24)
GFR calc Af Amer: >60 mL/min (ref >60)
GFR calc non Af Amer: 58 mL/min — ABNORMAL LOW (ref >60)
Glucose, Bld: 209 mg/dL — ABNORMAL HIGH (ref 70–99)
Potassium: 3.9 mmol/L (ref 3.5–5.1)
Sodium: 137 mmol/L (ref 135–145)

## 2020-03-19 LAB — CBC
HCT: 34.2 % — ABNORMAL LOW (ref 39.0–52.0)
Hemoglobin: 11.4 g/dL — ABNORMAL LOW (ref 13.0–17.0)
MCH: 30.9 pg (ref 26.0–34.0)
MCHC: 33.3 g/dL (ref 30.0–36.0)
MCV: 92.7 fL (ref 80.0–100.0)
Platelets: 141 10*3/uL — ABNORMAL LOW (ref 150–400)
RBC: 3.69 MIL/uL — ABNORMAL LOW (ref 4.22–5.81)
RDW: 13.5 % (ref 11.5–15.5)
WBC: 6.6 10*3/uL (ref 4.0–10.5)
nRBC: 0 % (ref 0.0–0.2)

## 2020-03-19 LAB — COOXEMETRY PANEL
Carboxyhemoglobin: 2.1 % — ABNORMAL HIGH (ref 0.5–1.5)
Methemoglobin: 1 % (ref 0.0–1.5)
O2 Saturation: 71.3 %
Total hemoglobin: 10.8 g/dL — ABNORMAL LOW (ref 12.0–16.0)

## 2020-03-19 LAB — LUPUS ANTICOAGULANT PANEL
DRVVT: 34.2 s (ref 0.0–47.0)
PTT Lupus Anticoagulant: 48.1 s (ref 0.0–51.9)

## 2020-03-19 LAB — URINALYSIS, ROUTINE W REFLEX MICROSCOPIC
Bilirubin Urine: NEGATIVE
Glucose, UA: 150 mg/dL — AB
Hgb urine dipstick: NEGATIVE
Ketones, ur: NEGATIVE mg/dL
Leukocytes,Ua: NEGATIVE
Nitrite: NEGATIVE
Protein, ur: NEGATIVE mg/dL
Specific Gravity, Urine: 1.006 (ref 1.005–1.030)
pH: 7 (ref 5.0–8.0)

## 2020-03-19 LAB — GLUCOSE, CAPILLARY
Glucose-Capillary: 210 mg/dL — ABNORMAL HIGH (ref 70–99)
Glucose-Capillary: 182 mg/dL — ABNORMAL HIGH (ref 70–99)
Glucose-Capillary: 194 mg/dL — ABNORMAL HIGH (ref 70–99)
Glucose-Capillary: 336 mg/dL — ABNORMAL HIGH (ref 70–99)
Glucose-Capillary: 436 mg/dL — ABNORMAL HIGH (ref 70–99)
Glucose-Capillary: 444 mg/dL — ABNORMAL HIGH (ref 70–99)

## 2020-03-19 LAB — HEPARIN LEVEL (UNFRACTIONATED)
Heparin Unfractionated: 0.16 IU/mL — ABNORMAL LOW (ref 0.30–0.70)
Heparin Unfractionated: 0.24 IU/mL — ABNORMAL LOW (ref 0.30–0.70)

## 2020-03-19 LAB — POCT I-STAT 7, (LYTES, BLD GAS, ICA,H+H)
Acid-Base Excess: 4 mmol/L — ABNORMAL HIGH (ref 0.0–2.0)
Bicarbonate: 26.3 mmol/L (ref 20.0–28.0)
Calcium, Ion: 1.21 mmol/L (ref 1.15–1.40)
HCT: 38 % — ABNORMAL LOW (ref 39.0–52.0)
Hemoglobin: 12.9 g/dL — ABNORMAL LOW (ref 13.0–17.0)
O2 Saturation: 97 %
Patient temperature: 98.2
Potassium: 3.9 mmol/L (ref 3.5–5.1)
Sodium: 134 mmol/L — ABNORMAL LOW (ref 135–145)
TCO2: 27 mmol/L (ref 22–32)
pCO2 arterial: 32.1 mmHg (ref 32.0–48.0)
pH, Arterial: 7.52 — ABNORMAL HIGH (ref 7.350–7.450)
pO2, Arterial: 84 mmHg (ref 83.0–108.0)

## 2020-03-19 LAB — SURGICAL PCR SCREEN
MRSA, PCR: NEGATIVE
Staphylococcus aureus: NEGATIVE

## 2020-03-19 LAB — GLUCOSE, RANDOM: Glucose, Bld: 440 mg/dL — ABNORMAL HIGH (ref 70–99)

## 2020-03-19 MED ORDER — SODIUM CHLORIDE 0.9 % IV SOLN
750.0000 mg | INTRAVENOUS | Status: AC
  Administered 2020-03-20: 750 mg via INTRAVENOUS
  Filled 2020-03-19: qty 750

## 2020-03-19 MED ORDER — EPINEPHRINE HCL 5 MG/250ML IV SOLN IN NS
0.0000 ug/min | INTRAVENOUS | Status: AC
  Administered 2020-03-20: 2 ug/min via INTRAVENOUS
  Filled 2020-03-19: qty 250

## 2020-03-19 MED ORDER — POTASSIUM CHLORIDE 2 MEQ/ML IV SOLN
80.0000 meq | INTRAVENOUS | Status: DC
  Filled 2020-03-19: qty 40

## 2020-03-19 MED ORDER — TEMAZEPAM 15 MG PO CAPS: 15.0000 mg | ORAL_CAPSULE | Freq: Once | ORAL | Status: DC | PRN

## 2020-03-19 MED ORDER — TRANEXAMIC ACID (OHS) BOLUS VIA INFUSION
15.0000 mg/kg | INTRAVENOUS | Status: AC
  Administered 2020-03-20: 1621.5 mg via INTRAVENOUS
  Filled 2020-03-19: qty 1622

## 2020-03-19 MED ORDER — INSULIN REGULAR(HUMAN) IN NACL 100-0.9 UT/100ML-% IV SOLN
INTRAVENOUS | Status: AC
  Administered 2020-03-20: 11.5 [IU]/h via INTRAVENOUS
  Filled 2020-03-19: qty 100

## 2020-03-19 MED ORDER — DEXMEDETOMIDINE HCL IN NACL 400 MCG/100ML IV SOLN
0.1000 ug/kg/h | INTRAVENOUS | Status: AC
  Administered 2020-03-20: .4 ug/kg/h via INTRAVENOUS
  Filled 2020-03-19: qty 100

## 2020-03-19 MED ORDER — VANCOMYCIN HCL 1500 MG/300ML IV SOLN
1500.0000 mg | INTRAVENOUS | Status: AC
  Administered 2020-03-20: 1500 mg via INTRAVENOUS
  Filled 2020-03-19: qty 300

## 2020-03-19 MED ORDER — ENSURE ENLIVE PO LIQD
237.0000 mL | Freq: Three times a day (TID) | ORAL | Status: DC
  Administered 2020-03-19 (×2): 237 mL via ORAL

## 2020-03-19 MED ORDER — BISACODYL 5 MG PO TBEC
5.0000 mg | DELAYED_RELEASE_TABLET | Freq: Once | ORAL | Status: AC
  Administered 2020-03-19: 19:00:00 5 mg via ORAL
  Filled 2020-03-19: qty 1

## 2020-03-19 MED ORDER — FLUCONAZOLE IN SODIUM CHLORIDE 400-0.9 MG/200ML-% IV SOLN
400.0000 mg | INTRAVENOUS | Status: AC
  Administered 2020-03-20: 400 mg via INTRAVENOUS
  Filled 2020-03-19: qty 200

## 2020-03-19 MED ORDER — CHLORHEXIDINE GLUCONATE CLOTH 2 % EX PADS
6.0000 | MEDICATED_PAD | Freq: Once | CUTANEOUS | Status: AC
  Administered 2020-03-19 – 2020-03-20 (×2): 6 via TOPICAL

## 2020-03-19 MED ORDER — PHENYLEPHRINE HCL-NACL 20-0.9 MG/250ML-% IV SOLN
0.0000 ug/min | INTRAVENOUS | Status: DC
  Filled 2020-03-19 (×2): qty 250

## 2020-03-19 MED ORDER — FUROSEMIDE 10 MG/ML IJ SOLN
80.0000 mg | Freq: Two times a day (BID) | INTRAMUSCULAR | Status: DC
  Administered 2020-03-19 (×2): 80 mg via INTRAVENOUS
  Filled 2020-03-19 (×2): qty 8

## 2020-03-19 MED ORDER — SODIUM CHLORIDE 0.9 % IV SOLN
600.0000 mg | INTRAVENOUS | Status: AC
  Administered 2020-03-20: 600 mg via INTRAVENOUS
  Filled 2020-03-19: qty 600

## 2020-03-19 MED ORDER — HYDRALAZINE HCL 20 MG/ML IJ SOLN
10.0000 mg | Freq: Once | INTRAMUSCULAR | Status: AC
  Administered 2020-03-19: 21:00:00 10 mg via INTRAVENOUS
  Filled 2020-03-19: qty 1

## 2020-03-19 MED ORDER — TRANEXAMIC ACID (OHS) PUMP PRIME SOLUTION
2.0000 mg/kg | INTRAVENOUS | Status: DC
  Filled 2020-03-19: qty 2.16

## 2020-03-19 MED ORDER — TRANEXAMIC ACID 1000 MG/10ML IV SOLN
1.5000 mg/kg/h | INTRAVENOUS | Status: AC
  Administered 2020-03-20: 1.5 mg/kg/h via INTRAVENOUS
  Filled 2020-03-19: qty 25

## 2020-03-19 MED ORDER — CHLORHEXIDINE GLUCONATE CLOTH 2 % EX PADS
6.0000 | MEDICATED_PAD | Freq: Once | CUTANEOUS | Status: AC
  Administered 2020-03-19: 6 via TOPICAL

## 2020-03-19 MED ORDER — INSULIN GLARGINE 100 UNIT/ML ~~LOC~~ SOLN
15.0000 [IU] | Freq: Every day | SUBCUTANEOUS | Status: DC
  Administered 2020-03-19: 15 [IU] via SUBCUTANEOUS
  Filled 2020-03-19 (×2): qty 0.15

## 2020-03-19 MED ORDER — DOPAMINE-DEXTROSE 3.2-5 MG/ML-% IV SOLN
0.0000 ug/kg/min | INTRAVENOUS | Status: DC
  Filled 2020-03-19: qty 250

## 2020-03-19 MED ORDER — SODIUM CHLORIDE 0.9 % IV SOLN
INTRAVENOUS | Status: DC
  Filled 2020-03-19: qty 30

## 2020-03-19 MED ORDER — MAGNESIUM SULFATE 50 % IJ SOLN
40.0000 meq | INTRAMUSCULAR | Status: DC
  Filled 2020-03-19: qty 9.85

## 2020-03-19 MED ORDER — METOPROLOL TARTRATE 12.5 MG HALF TABLET
12.5000 mg | ORAL_TABLET | Freq: Once | ORAL | Status: AC
  Administered 2020-03-20: 05:00:00 12.5 mg via ORAL
  Filled 2020-03-19: qty 1

## 2020-03-19 MED ORDER — MILRINONE LACTATE IN DEXTROSE 20-5 MG/100ML-% IV SOLN
0.3000 ug/kg/min | INTRAVENOUS | Status: AC
  Administered 2020-03-20: .25 ug/kg/min via INTRAVENOUS
  Filled 2020-03-19: qty 100

## 2020-03-19 MED ORDER — DOBUTAMINE IN D5W 4-5 MG/ML-% IV SOLN
2.0000 ug/kg/min | INTRAVENOUS | Status: DC
  Filled 2020-03-19: qty 250

## 2020-03-19 MED ORDER — SODIUM CHLORIDE 0.9 % IV SOLN
1.5000 g | INTRAVENOUS | Status: AC
  Administered 2020-03-20: 1.5 g via INTRAVENOUS
  Filled 2020-03-19: qty 1.5

## 2020-03-19 MED ORDER — VASOPRESSIN 20 UNIT/ML IV SOLN
0.0400 [IU]/min | INTRAVENOUS | Status: DC
  Filled 2020-03-19: qty 2

## 2020-03-19 MED ORDER — NITROGLYCERIN IN D5W 200-5 MCG/ML-% IV SOLN
0.0000 ug/min | INTRAVENOUS | Status: DC
  Filled 2020-03-19: qty 250

## 2020-03-19 MED ORDER — HEPARIN (PORCINE) 25000 UT/250ML-% IV SOLN
1350.0000 [IU]/h | INTRAVENOUS | Status: DC
  Administered 2020-03-19: 1350 [IU]/h via INTRAVENOUS
  Filled 2020-03-19: qty 250

## 2020-03-19 MED ORDER — CHLORHEXIDINE GLUCONATE 0.12 % MT SOLN
15.0000 mL | Freq: Once | OROMUCOSAL | Status: AC
  Administered 2020-03-20: 15 mL via OROMUCOSAL
  Filled 2020-03-19: qty 15

## 2020-03-19 MED ORDER — VANCOMYCIN HCL 1000 MG IV SOLR
1000.0000 mg | INTRAVENOUS | Status: DC
  Filled 2020-03-19: qty 1000

## 2020-03-19 MED ORDER — NOREPINEPHRINE 4 MG/250ML-% IV SOLN
0.0000 ug/min | INTRAVENOUS | Status: AC
  Administered 2020-03-20: 2 ug/min via INTRAVENOUS
  Filled 2020-03-19: qty 250

## 2020-03-19 MED ORDER — POTASSIUM CHLORIDE CRYS ER 20 MEQ PO TBCR
40.0000 meq | EXTENDED_RELEASE_TABLET | Freq: Two times a day (BID) | ORAL | Status: AC
  Administered 2020-03-19 (×2): 40 meq via ORAL
  Filled 2020-03-19 (×2): qty 2

## 2020-03-19 NOTE — Progress Notes (Signed)
POST OPERATIVE NOTE:  03/19/2020 Gwen Her Furches [id number]  VITALS: BP (!) 137/100 (BP Location: Right Arm)   Pulse 100   Temp 98.4 F (36.9 C)   Resp 14   Ht 6' (1.829 m)   Wt 108.1 kg   SpO2 94%   BMI 32.32 kg/m   LABS:  Lab Results  Component Value Date   WBC 6.6 03/19/2020   HGB 12.9 (L) 03/19/2020   HCT 38.0 (L) 03/19/2020   MCV 92.7 03/19/2020   PLT 141 (L) 03/19/2020   BMET    Component Value Date/Time   NA 136 03/19/2020 1233   NA 132 (L) 03/07/2020 1501   K 3.9 03/19/2020 1233   CL 98 03/19/2020 1233   CO2 27 03/19/2020 1233   GLUCOSE 255 (H) 03/19/2020 1233   BUN 16 03/19/2020 1233   BUN 16 03/07/2020 1501   CREATININE 1.40 (H) 03/19/2020 1233   CREATININE 1.29 (H) 12/10/2016 1011   CALCIUM 9.3 03/19/2020 1233   GFRNONAA 51 (L) 03/19/2020 1233   GFRNONAA 57 (L) 01/28/2015 1558   GFRAA 59 (L) 03/19/2020 1233   GFRAA 66 01/28/2015 1558    Lab Results  Component Value Date   INR 1.3 (H) 03/23/2020   INR 1.3 (H) 01/07/2020   INR 1.3 (H) 01/06/2020   No results found for: PTT   Eligah East is status post extraction remaining for lower teeth in the operating room with monitored anesthesia care.  SUBJECTIVE: Patient denies having any significant dental discomfort at this time.  Patient denies having any active bleeding.  Patient indicates that he is able to eat soft foods without problems.  EXAM: There is no sign of infection, heme, or ooze.  Sutures are intact.  Clots are present.  There is evidence of intraoral and extraoral bruising and swelling.  ASSESSMENT: Post operative course is consistent with dental procedures performed in the operating room. Loss of teeth due to extraction Patient is now completely edentulous  PLAN: 1.  Use chlorhexidine rinses twice daily after breakfast and at bedtime to aid in disinfection of the oral cavity. 2.  Advance diet as tolerated with help of nutritional consultation. 3.  Patient is to  follow-up evaluation of healing and suture removal after he is discharged from anticipated LVAD procedure. 4.  Patient is to follow-up with a dentist of his choice for fabrication of upper and lower complete dentures after adequate healing and once he is medically stable with anticipated LVAD procedure. 5.  Patient is currently cleared for LVAD procedure tomorrow with Dr. Orvan Seen.   Lenn Cal, DDS

## 2020-03-19 NOTE — Progress Notes (Signed)
Initial Nutrition Assessment  DOCUMENTATION CODES:   Not applicable  INTERVENTION:   Ensure Enlive po TID, each supplement provides 350 kcal and 20 grams of protein   NUTRITION DIAGNOSIS:   Increased nutrient needs related to post-op healing, chronic illness as evidenced by estimated needs.  GOAL:   Patient will meet greater than or equal to 90% of their needs  MONITOR:   PO intake, Supplement acceptance, Labs, Weight trends  REASON FOR ASSESSMENT:   Consult LVAD Eval  ASSESSMENT:   69 yo male admitted with acute on chronic CHF, AKI on CKD 3. Initial plan for CABG but with re-do sternotomy, poor targets and longstanding low EF plan for LVAD instead. PMH includes CAD, CKD, DM   3/31 Admit, Cath with severe 3v CAD 4/03 Swan placed  4/06 Teeth extraction  Plan for LVAD tomorrow  Pt sitting up in bed, alert. Pt with very good appetite; report eating well currently and PTA. Recorded po intake 60-100%, 90% on average. Pt reports he likes Ensure and amenable to supplements  Pt is edentulous; reports no problems chewing currently as pt reports he only had 4 teeth prior to dental extraction so his "gums are tough" and can chew anything. Currently on GI soft diet, will change to Dysphagia 3/Mech Soft (easy to chew) diet.   Talked about the importance of nutrition post-op. Plan to start Ensure supplements today and continue post surgery  Pt denies any weight loss; current weight 108.1 kg; admit weight 111.1 kg. No weight loss noted. Pt reports UBW "with fluid on" is around 250 pounds  Labs: CBGs 182-297 Meds: lasix, ss novolog with meals and at bedtime, KCL   NUTRITION - FOCUSED PHYSICAL EXAM:    Most Recent Value  Orbital Region  No depletion  Upper Arm Region  No depletion  Thoracic and Lumbar Region  No depletion  Temple Region  No depletion  Clavicle Bone Region  No depletion  Clavicle and Acromion Bone Region  No depletion  Scapular Bone Region  No depletion   Dorsal Hand  No depletion  Patellar Region  No depletion  Anterior Thigh Region  No depletion  Posterior Calf Region  No depletion  Edema (RD Assessment)  Mild       Diet Order:   Diet Order            DIET DYS 3 Room service appropriate? Yes; Fluid consistency: Thin  Diet effective now              EDUCATION NEEDS:   Education needs have been addressed  Skin:  Skin Assessment: Reviewed RN Assessment  Last BM:  4/6  Height:   Ht Readings from Last 1 Encounters:  03/07/2020 6' (1.829 m)    Weight:   Wt Readings from Last 1 Encounters:  03/31/2020 108.1 kg    BMI:  Body mass index is 32.32 kg/m.  Estimated Nutritional Needs:   Kcal:  2100-2300 kcals  Protein:  110-125 g  Fluid:  1500-2000 mL   Kerman Passey MS, RDN, LDN, CNSC RD Pager Number and Weekend/On-Call After Hours Pager Located in [place]

## 2020-03-19 NOTE — Progress Notes (Signed)
Chaplain engaged in initial visit with [name] [name]. [name] [name] shared with chaplain his career experiences as a former member of [employer].  [name] [name] stated how he feels about the [name] [name] case and how he never allowed race or how someone looked to dictate the care he provided to citizens.  He stated that he would pray not to have to use his gun.  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  He stated they would carry scissors in their back pocket to cut down the inmates who had hung themselves.   [name] [name] also discussed the strained relationship he has with his biological children.  [name] [name] conveyed that he has tried to reconcile with them but that it has not worked because of the lifestyles they choose to engage in and how that has been contrary to his role in Event organ[employer].  He went on to state how he had a great relationship with his step-children and that his step-daughter who does home healthcare will be the one taking care of him once he leaves.  Chaplain asked [name] [name] how he was feeling regarding his upcoming procedure and he presented that it was in God's hands either way.  He seemed indifferent about the outcome, but trusting that everything will go well.  He found humor around the fact that he will have batteries and called himself a "cyborg." [name] [name] seems to hold the ideal of, "Whatever happens happens."  He has accepted what may or may not happen and was able to verbalize that he knows he has a weak heart but that the doctors seem optimistic about the procedure.  He talked about doing a lot of prayer and having a level of faith and relationship with God.   Chaplain offered empathic listening and prayer to [name] [name].  Chaplain affirmed [name] [name]' experiences and probed his understanding of his current health condition and his mindset around that.

## 2020-03-19 NOTE — Progress Notes (Addendum)
[place] for heparin Indication: 3v disease; CABG eval  and persistent Afib/flutter  Allergies  Allergen Reactions  . Liraglutide Nausea And Vomiting  . Lisinopril Cough    Patient Measurements: Height: 6' (182.9 cm) Weight: 108.1 kg (238 lb 5.1 oz) IBW/kg (Calculated) : 77.6 Heparin Dosing Weight: 101.2 kg  Vital Signs: Temp: 98.2 F (36.8 C) (04/07 0754) BP: 146/99 (04/07 0700) Pulse Rate: 95 (04/07 0754)  Labs: Recent Labs    04/04/2020 0159 04/03/2020 0159 04/10/2020 0200 03/21/2020 1312 03/19/2020 0352 03/19/20 0452  HGB 11.5*   < >  --   --  10.9* 11.4*  HCT 34.6*  --   --   --  33.9* 34.2*  PLT 160  --   --   --  138* 141*  APTT  --   --   --  80*  --   --   LABPROT  --   --   --  15.9*  --   --   INR  --   --   --  1.3*  --   --   HEPARINUNFRC  --   --  0.31  --   --  0.16*  CREATININE 2.10*  --   --   --  1.56* 1.25*   < > = values in this interval not displayed.    Estimated Creatinine Clearance: 69.8 mL/min (A) (by C-G formula based on SCr of 1.25 mg/dL (H)).   Medical History: Past Medical History:  Diagnosis Date  . Anxiety   . Arthritis   . CAD (coronary artery disease)    a.  1993 s/p MI - [employer];  b. s/p BMS to LAD '00;  c. PTCA 2nd diagonal 2010;  d. 02/18/12 Cath: moderate nonobs dzs - med rx;  e.  01/2015 Cath: LM nl, LAD 40-31m ISR, 70-43m/d, d1 90p (3.0x16 Synergy DES), D2 50-60, LCX nl, OM1 50p, 57m (2.5x12 Synergy DES), RCA nl, EF 30-35%.  . Chronic combined systolic and diastolic CHF (congestive heart failure) ([place])    a. 12/2014 Echo: EF 30-35%, Gr2 DD, mod MR, sev dil LA.  . CKD (chronic kidney disease), stage III   . Depression   . ED (erectile dysfunction)   . GERD (gastroesophageal reflux disease)   . Hyperlipidemia   . Hypertension   . Ischemic cardiomyopathy    a. s/p St. [name] (Atlas) ICD implanted in [place] 2007;  b. 12/2014 Echo: Ef 30-35%.  . MVP (mitral valve prolapse)    a. s/p MV  annuloplasty at [facility] 2004.  Marland Kitchen Persistent atrial fibrillation ([place])    a. noted on ICD interrogation '10 - not previously on [place] - CHA2DS2VASc = 5.  . Type II diabetes mellitus ([place])    uncontrolled    Medications:  Medications Prior to Admission  Medication Sig Dispense Refill Last Dose  . ALPRAZolam (XANAX) 0.25 MG tablet Take 0.25 mg by mouth 3 (three) times daily as needed for anxiety.    unknown  . amiodarone (PACERONE) 200 MG tablet Take 200 mg by mouth daily.    03/11/2020 at Unknown time  . apixaban (ELIQUIS) 5 MG TABS tablet Take 1 tablet (5 mg total) by mouth 2 (two) times daily. 60 tablet 1 03/11/2020 at 0600  . atorvastatin (LIPITOR) 40 MG tablet Take 1 tablet (40 mg total) by mouth daily at 6 PM. 30 tablet 0 03/10/2020 at Unknown time  . citalopram (CELEXA) 20 MG tablet Take 20 mg by  mouth daily.    03/11/2020 at Unknown time  . furosemide (LASIX) 40 MG tablet Take 40 mg by mouth daily.    03/11/2020 at Unknown time  . insulin aspart (NOVOLOG FLEXPEN) 100 UNIT/ML FlexPen Inject 6 Units into the skin 3 (three) times daily with meals. 15 mL 0 03/11/2020 at Unknown time  . Insulin Glargine (BASAGLAR KWIKPEN) 100 UNIT/ML SOPN Inject 0.5 mLs (50 Units total) into the skin daily. 15 mL 1 03/11/2020 at Unknown time  . metoprolol succinate (TOPROL-XL) 100 MG 24 hr tablet Take 1 tablet (100 mg total) by mouth daily. 90 tablet 3 03/11/2020 at 0600  . nitroGLYCERIN (NITROSTAT) 0.4 MG SL tablet Place 1 tablet (0.4 mg total) under the tongue every 5 (five) minutes x 3 doses as needed for chest pain. 10 tablet 0 unknown  . pantoprazole (PROTONIX) 40 MG tablet Take 1 tablet (40 mg total) by mouth daily. 30 tablet 1 03/11/2020 at Unknown time  . potassium chloride SA (KLOR-CON) 20 MEQ tablet Take 20 mEq by mouth daily.    03/11/2020 at Unknown time  . tamsulosin (FLOMAX) 0.4 MG CAPS capsule Take 1 capsule (0.4 mg total) by mouth daily. 30 capsule 0 03/11/2020 at Unknown time  . ALPRAZolam (XANAX) 1  MG tablet Take 1 tablet (1 mg total) by mouth 2 (two) times daily as needed for anxiety. (Patient not taking: Reported on 02/21/2020) 10 tablet 0 Not Taking at Unknown time    Assessment: 38 YOM with atrial fibrillation on Eliquis PTA s/p R & L heart cath found to have 3v disease now being evaluated for CABG or LVAD    Restarted last night after having teeth pulled. Heparin level this morning came back subtherapeutic at 0.16, on 1100 units/hr. Hgb 11.4, plt 141. No s/sx of bleeding or infusion issues.   Goal of Therapy:  Heparin level 0.3-0.7 units/ml Monitor platelets by anticoagulation protocol: Yes   Plan:  Increase heparin infusion to heparin gtt at 1250 units/hr Order heparin level in 7 hours Monitor daily heparin level and CBC, s/sx bleeding Plan for LVAD on 4/8  Antonietta Jewel, PharmD, [place] Pharmacist  Phone: [phone]  Please check AMION for all [place] phone numbers After 10:00 PM, call [place] [phone] 03/19/2020 8:36 AM

## 2020-03-19 NOTE — Consult Note (Signed)
Consultation Note Date: 03/19/2020   Patient Name: [name] [name]  DOB: [birth year]  MRN: [id number]  Age / Sex: 69 y.o., male  PCP: [name], [place] Referring Physician: Troy Sine, MD  Reason for Consultation: Establishing goals of care/ LVAD evaluation  HPI/Patient Profile: 69 y.o. male   admitted on 02/28/2020 with PMH of CAD with prior stenting of the LAD,  ischemic cardiomyopathy with ICD in place, chronic systolic CHF, MVP s/p mitral valve repair in 2004 at [facility], permanent atrial fib on chronic anticoagulation, HTN, HLD, DM, depression who was admitted to [facility] with c/o progressive weakness, fatigue, dyspnea and chest pressure.   Troponin mildly elevated at 0.06. He was transferred to [facility] for cardiac cath. His cardiac catheterization shows severe three vessel CAD.  Initially plan was for CABG but on further work-up and consideration LVAD is the better option.  Patient is status post teeth extraction in preparation for surgery, his creatinine has improved and LVAD placement is anticipated for 04/04/2020.  Palliative medicine consulted as part of  collaborative team approach to LVAD assessment.     Clinical Assessment and Goals of Care:  This NP Wadie Lessen reviewed medical records, received report from team, assessed the patient and then meet at the bedside to discuss advanced directives and a preparedness plan in light of scheduled LVAD implantation sometime this week.  This is  LVAD destination therapy.  A detailed discussion was had today regarding the concept of a preparedness plan as it relates to LVAD     Patient was comfortable talking about the "what ifs"and the importance of today's conversation in order for healthcare team to better  understand the patient's basic beliefs and wishes as it relates  to healthcare, quality of life and end of life  wishes  Concepts specific to long term ventilation, artificial feeding and hydration, psychological adjustments to LVAD and future need to terminate pump were discussed.  Created space and opportunity for patient to explore his thoughts and feelings regarding his current medical situation.  Patient was able to verbalize the importance of quality of life.  Very pragmatically he voices "I do not really have any other choice but to move forward with this pump", he understands the seriousness of his situation,  the risks and benefits of the surgery and ultimately is hopeful that the LVAD will increase his quantity and quality of life.      He is optimistic for successful surgery and recovery!                At this time patient is open to all available medical interventions to prolong life and the success of the LVAD therapy.   He shared and verbalized that if he is unable to make decisions for himself,  his [place] will be his main decision maker at that time.   Patient was  encouraged to continue conversation with his medical providers and his family into the future as it is vital for the patient centered care.  Chaplain services consulted      SUMMARY OF RECOMMENDATIONS    Code Status/Advance Care Planning:  Full code   Patient is open to all offered and available medical interventions to prolong life and, enhance success of his LVAD surgery and recovery   Psycho-social/Spiritual:   Desire for further Chaplaincy support: yes and I placed consult     Primary Diagnoses: Present on Admission: . Unstable angina (HCC)   I have reviewed the medical record, interviewed the patient and family, and examined the patient. The following aspects are pertinent.  Past Medical History:  Diagnosis Date  . Anxiety   . Arthritis   . CAD (coronary artery disease)    a.  1993 s/p MI - [employer];  b. s/p BMS to LAD '00;  c. PTCA 2nd diagonal 2010;  d. 02/18/12 Cath:  moderate nonobs dzs - med rx;  e.  01/2015 Cath: LM nl, LAD 40-17m ISR, 70-44m/d, d1 90p (3.0x16 Synergy DES), D2 50-60, LCX nl, OM1 50p, 37m (2.5x12 Synergy DES), RCA nl, EF 30-35%.  . Chronic combined systolic and diastolic CHF (congestive heart failure) ([place])    a. 12/2014 Echo: EF 30-35%, Gr2 DD, mod MR, sev dil LA.  . CKD (chronic kidney disease), stage III   . Depression   . ED (erectile dysfunction)   . GERD (gastroesophageal reflux disease)   . Hyperlipidemia   . Hypertension   . Ischemic cardiomyopathy    a. s/p St. [name] (Atlas) ICD implanted in [place] 2007;  b. 12/2014 Echo: Ef 30-35%.  . MVP (mitral valve prolapse)    a. s/p MV annuloplasty at [facility] 2004.  Marland Kitchen Persistent atrial fibrillation ([place])    a. noted on ICD interrogation '10 - not previously on [place] - CHA2DS2VASc = 5.  . Type II diabetes mellitus ([place])    uncontrolled   Social History   Socioeconomic History  . Marital status: Married    Spouse name: Not on file  . Number of children: 8  . Years of education: Not on file  . Highest education level: Not on file  Occupational History  . Occupation: Community education officer    Comment: disabled  Tobacco Use  . Smoking status: Never Smoker  . Smokeless tobacco: Never Used  Substance and Sexual Activity  . Alcohol use: Yes    Alcohol/week: 0.0 standard drinks    Comment: 01/14/2015 "used to drink alot; stopped completely in 1983"  . Drug use: No    Comment: 01/14/2015 "used to use about anything I could get; stopped completely in 1983"  . Sexual activity: Not Currently  Other Topics Concern  . Not on file  Social History Narrative  . Not on file   Social Determinants of Health   Financial Resource Strain:   . Difficulty of Paying Living Expenses:   Food Insecurity:   . Worried About Charity fundraiser in the Last Year:   . Arboriculturist in the Last Year:   Transportation Needs:   . Film/video editor (Medical):   Marland Kitchen Lack of Transportation (Non-Medical):    Physical Activity:   . Days of Exercise per Week:   . Minutes of Exercise per Session:   Stress:   . Feeling of Stress :   Social Connections:   . Frequency of Communication with Friends and Family:   . Frequency of Social Gatherings with Friends and Family:   . Attends Religious Services:   . Active Member of Clubs or Organizations:   .  Attends Archivist Meetings:   Marland Kitchen Marital Status:    Family History  Problem Relation Age of Onset  . Coronary artery disease Mother   . Colon cancer Mother   . Heart attack Mother   . Heart disease Mother   . Heart failure Mother   . Hypertension Mother   . Malignant hyperthermia Mother   . Colon cancer Sister   . Coronary artery disease Brother   . Heart disease Brother    Scheduled Meds: . amiodarone  200 mg Oral Daily  . aspirin  81 mg Oral Daily  . atorvastatin  80 mg Oral q1800  . Chlorhexidine Gluconate Cloth  6 each Topical Daily  . citalopram  20 mg Oral Daily  . influenza vaccine adjuvanted  0.5 mL Intramuscular Tomorrow-1000  . insulin aspart  0-20 Units Subcutaneous TID WC  . insulin aspart  0-5 Units Subcutaneous QHS  . insulin glargine  10 Units Subcutaneous Daily  . pantoprazole  40 mg Oral Daily  . sodium chloride flush  10-40 mL Intracatheter Q12H  . tamsulosin  0.4 mg Oral Daily   Continuous Infusions: . sodium chloride    . heparin 1,100 Units/hr (03/19/20 0100)  . milrinone 0.25 mcg/kg/min (03/19/20 0100)  . norepinephrine (LEVOPHED) Adult infusion Stopped (03/16/20 1657)   PRN Meds:.acetaminophen, ALPRAZolam, HYDROcodone-acetaminophen, nitroGLYCERIN, ondansetron (ZOFRAN) IV, sodium chloride flush Medications Prior to Admission:  Prior to Admission medications   Medication Sig Start Date End Date Taking? Authorizing Provider  ALPRAZolam (XANAX) 0.25 MG tablet Take 0.25 mg by mouth 3 (three) times daily as needed for anxiety.  02/07/20  Yes [provider]  amiodarone (PACERONE) 200 MG tablet  Take 200 mg by mouth daily.  02/18/20  Yes [provider]  apixaban (ELIQUIS) 5 MG TABS tablet Take 1 tablet (5 mg total) by mouth 2 (two) times daily. 01/08/20  Yes Eugenie Filler, MD  atorvastatin (LIPITOR) 40 MG tablet Take 1 tablet (40 mg total) by mouth daily at 6 PM. 01/08/20  Yes Eugenie Filler, MD  citalopram (CELEXA) 20 MG tablet Take 20 mg by mouth daily.  02/07/20  Yes [provider]  furosemide (LASIX) 40 MG tablet Take 40 mg by mouth daily.  02/01/20  Yes [provider]  insulin aspart (NOVOLOG FLEXPEN) 100 UNIT/ML FlexPen Inject 6 Units into the skin 3 (three) times daily with meals. 01/08/20  Yes Eugenie Filler, MD  Insulin Glargine (BASAGLAR KWIKPEN) 100 UNIT/ML SOPN Inject 0.5 mLs (50 Units total) into the skin daily. 01/08/20  Yes Eugenie Filler, MD  metoprolol succinate (TOPROL-XL) 100 MG 24 hr tablet Take 1 tablet (100 mg total) by mouth daily. 03/07/20  Yes Revankar, Reita Cliche, MD  nitroGLYCERIN (NITROSTAT) 0.4 MG SL tablet Place 1 tablet (0.4 mg total) under the tongue every 5 (five) minutes x 3 doses as needed for chest pain. 01/08/20  Yes Eugenie Filler, MD  pantoprazole (PROTONIX) 40 MG tablet Take 1 tablet (40 mg total) by mouth daily. 01/09/20  Yes Eugenie Filler, MD  potassium chloride SA (KLOR-CON) 20 MEQ tablet Take 20 mEq by mouth daily.  02/01/20  Yes [provider]  tamsulosin (FLOMAX) 0.4 MG CAPS capsule Take 1 capsule (0.4 mg total) by mouth daily. 01/08/20  Yes Eugenie Filler, MD  ALPRAZolam Duanne Moron) 1 MG tablet Take 1 tablet (1 mg total) by mouth 2 (two) times daily as needed for anxiety. Patient not taking: Reported on 03/02/2020 01/08/20  Eugenie Filler, MD   Allergies  Allergen Reactions  . Liraglutide Nausea And Vomiting  . Lisinopril Cough   Review of Systems  Neurological: Positive for weakness.    Physical Exam Constitutional:      Appearance: He is normal weight. He is ill-appearing.      Interventions: Nasal cannula in place.  Cardiovascular:     Rate and Rhythm: Normal rate.  Skin:    General: Skin is warm and dry.  Neurological:     Mental Status: He is alert and oriented to person, place, and time.     Vital Signs: BP (!) 146/99   Pulse 98   Temp 98.1 F (36.7 C)   Resp 11   Ht 6' (1.829 m)   Wt 108.1 kg   SpO2 98%   BMI 32.32 kg/m  Pain Scale: 0-10 POSS *See Group Information*: 1-Acceptable,Awake and alert Pain Score: Asleep   SpO2: SpO2: 98 % O2 Device:SpO2: 98 % O2 Flow Rate: .O2 Flow Rate (L/min): 2 L/min  IO: Intake/output summary:   Intake/Output Summary (Last 24 hours) at 03/19/2020 0754 Last data filed at 03/19/2020 0400 Gross per 24 hour  Intake 224.66 ml  Output 2630 ml  Net -2405.34 ml    LBM: Last BM Date: 03/28/2020 Baseline Weight: Weight: 111.1 kg Most recent weight: Weight: 108.1 kg     Palliative Assessment/Data:   PMT will continue to support holistically  Time In: 0800 Time Out: 0910 Time Total: 70 minutes Greater than 50%  of this time was spent counseling and coordinating care related to the above assessment and plan.  Signed by: Wadie Lessen, NP   Please contact Palliative Medicine Team phone at [phone] for questions and concerns.  For individual provider: See Shea Evans

## 2020-03-19 NOTE — Progress Notes (Signed)
[place] for heparin Indication: 3v disease; CABG eval  and persistent Afib/flutter  Allergies  Allergen Reactions  . Liraglutide Nausea And Vomiting  . Lisinopril Cough    Patient Measurements: Height: 6' (182.9 cm) Weight: 108.1 kg (238 lb 5.1 oz) IBW/kg (Calculated) : 77.6 Heparin Dosing Weight: 101.2 kg  Vital Signs: Temp: 98.4 F (36.9 C) (04/07 1500) Temp Source: Core (04/07 1200) BP: 137/100 (04/07 1200) Pulse Rate: 99 (04/07 1500)  Labs: Recent Labs    04/02/2020 0159 03/28/2020 0159 04/11/2020 0200 04/06/2020 1312 03/31/2020 0352 03/29/2020 0352 03/19/20 0452 03/19/20 1223 03/19/20 1233 03/19/20 1533  HGB 11.5*   < >  --   --  10.9*   < > 11.4* 12.9*  --   --   HCT 34.6*   < >  --   --  33.9*  --  34.2* 38.0*  --   --   PLT 160  --   --   --  138*  --  141*  --   --   --   APTT  --   --   --  80*  --   --   --   --   --   --   LABPROT  --   --   --  15.9*  --   --   --   --   --   --   INR  --   --   --  1.3*  --   --   --   --   --   --   HEPARINUNFRC  --   --  0.31  --   --   --  0.16*  --   --  0.24*  CREATININE 2.10*   < >  --   --  1.56*  --  1.25*  --  1.40*  --    < > = values in this interval not displayed.    Estimated Creatinine Clearance: 62.4 mL/min (A) (by C-G formula based on SCr of 1.4 mg/dL (H)).   Medical History: Past Medical History:  Diagnosis Date  . Anxiety   . Arthritis   . CAD (coronary artery disease)    a.  1993 s/p MI - [employer];  b. s/p BMS to LAD '00;  c. PTCA 2nd diagonal 2010;  d. 02/18/12 Cath: moderate nonobs dzs - med rx;  e.  01/2015 Cath: LM nl, LAD 40-75m ISR, 70-40m/d, d1 90p (3.0x16 Synergy DES), D2 50-60, LCX nl, OM1 50p, 46m (2.5x12 Synergy DES), RCA nl, EF 30-35%.  . Chronic combined systolic and diastolic CHF (congestive heart failure) ([place])    a. 12/2014 Echo: EF 30-35%, Gr2 DD, mod MR, sev dil LA.  . CKD (chronic kidney disease), stage III   . Depression   . ED (erectile  dysfunction)   . GERD (gastroesophageal reflux disease)   . Hyperlipidemia   . Hypertension   . Ischemic cardiomyopathy    a. s/p St. [name] (Atlas) ICD implanted in [place] 2007;  b. 12/2014 Echo: Ef 30-35%.  . MVP (mitral valve prolapse)    a. s/p MV annuloplasty at [facility] 2004.  Marland Kitchen Persistent atrial fibrillation ([place])    a. noted on ICD interrogation '10 - not previously on [place] - CHA2DS2VASc = 5.  . Type II diabetes mellitus ([place])    uncontrolled    Medications:  Medications Prior to Admission  Medication Sig Dispense Refill Last Dose  . ALPRAZolam (  XANAX) 0.25 MG tablet Take 0.25 mg by mouth 3 (three) times daily as needed for anxiety.    unknown  . amiodarone (PACERONE) 200 MG tablet Take 200 mg by mouth daily.    03/11/2020 at Unknown time  . apixaban (ELIQUIS) 5 MG TABS tablet Take 1 tablet (5 mg total) by mouth 2 (two) times daily. 60 tablet 1 03/11/2020 at 0600  . atorvastatin (LIPITOR) 40 MG tablet Take 1 tablet (40 mg total) by mouth daily at 6 PM. 30 tablet 0 03/10/2020 at Unknown time  . citalopram (CELEXA) 20 MG tablet Take 20 mg by mouth daily.    03/11/2020 at Unknown time  . furosemide (LASIX) 40 MG tablet Take 40 mg by mouth daily.    03/11/2020 at Unknown time  . insulin aspart (NOVOLOG FLEXPEN) 100 UNIT/ML FlexPen Inject 6 Units into the skin 3 (three) times daily with meals. 15 mL 0 03/11/2020 at Unknown time  . Insulin Glargine (BASAGLAR KWIKPEN) 100 UNIT/ML SOPN Inject 0.5 mLs (50 Units total) into the skin daily. 15 mL 1 03/11/2020 at Unknown time  . metoprolol succinate (TOPROL-XL) 100 MG 24 hr tablet Take 1 tablet (100 mg total) by mouth daily. 90 tablet 3 03/11/2020 at 0600  . nitroGLYCERIN (NITROSTAT) 0.4 MG SL tablet Place 1 tablet (0.4 mg total) under the tongue every 5 (five) minutes x 3 doses as needed for chest pain. 10 tablet 0 unknown  . pantoprazole (PROTONIX) 40 MG tablet Take 1 tablet (40 mg total) by mouth daily. 30 tablet 1 03/11/2020 at Unknown time  .  potassium chloride SA (KLOR-CON) 20 MEQ tablet Take 20 mEq by mouth daily.    03/11/2020 at Unknown time  . tamsulosin (FLOMAX) 0.4 MG CAPS capsule Take 1 capsule (0.4 mg total) by mouth daily. 30 capsule 0 03/11/2020 at Unknown time  . ALPRAZolam (XANAX) 1 MG tablet Take 1 tablet (1 mg total) by mouth 2 (two) times daily as needed for anxiety. (Patient not taking: Reported on 03/06/2020) 10 tablet 0 Not Taking at Unknown time    Assessment: 70 YOM with atrial fibrillation on Eliquis PTA s/p R & L heart cath found to have 3v disease now being evaluated for CABG or LVAD    Restarted last night after having teeth pulled.  Heparin level is 0.24 after increase to 1250 units/hr.  H/H is stable. Platelets are 141(down from 161) and will monitor. No s/sx of bleeding or infusion issues.   Goal of Therapy:  Heparin level 0.3-0.7 units/ml Monitor platelets by anticoagulation protocol: Yes   Plan:  Increase heparin infusion to 1350 units/hr.  No bolus.  Order heparin level in 7 hours Monitor daily heparin level and CBC, s/sx bleeding Plan for LVAD on 4/8 at 0715 - Heparin to be shut off on call.   Sloan Leiter, PharmD, BCPS, BCCCP Clinical Pharmacist Please refer to [facility] for [place] numbers 03/19/2020 4:17 PM

## 2020-03-19 NOTE — Progress Notes (Signed)
Inpatient Diabetes Program Recommendations  AACE/ADA: New Consensus Statement on Inpatient Glycemic Control (2015)  Target Ranges:  Prepandial:   less than 140 mg/dL      Peak postprandial:   less than 180 mg/dL (1-2 hours)      Critically ill patients:  140 - 180 mg/dL   Lab Results  Component Value Date   GLUCAP 182 (H) 03/19/2020   HGBA1C 10.5 (H) 03/28/2020    Review of Glycemic Control Results for [name] [name], [name] [name] (MRN [id number]) as of 03/19/2020 09:35  Ref. Range 03/15/2020 16:10 04/08/2020 22:01 03/19/2020 06:42 03/19/2020 08:15  Glucose-Capillary Latest Ref Range: 70 - 99 mg/dL 241 (H) 297 (H) 210 (H) 182 (H)   Diabetes history:DM2 Outpatient Diabetes medications:Basaglar 50 units daily, Novolog 6 units TID with meals Current orders for Inpatient glycemic control:Novolog 0-20 units TID with meals, Novolog 0-5 units QHS, Lantus 10 units QD  Inpatient Diabetes Program Recommendations:  Consider increasing Lantus to 15 units QD given current glucose trends and in preparation for upcoming surgery.   Thanks, Bronson Curb, MSN, RNC-OB Diabetes Coordinator [phone] (8a-5p)

## 2020-03-19 NOTE — Progress Notes (Signed)
Minnie Shi has been discussed with the VAD Medical Review board on 03/19/2020. The team feels as if the patient is a good candidate for Destination LVAD therapy. The patient meets criteria for a LVAD implant as listed below:  1)  Has failed to respond to optimal medical management (including beta-blockers and ACE inhibitors if tolerated) for at least 45 of the last 60 days, or have been balloon dependent for 7 days, or IV inotrope dependent for 14 days; and,       *On Inotropes Milrinone started 03/15/2020  2)  Has a left ventricular ejection fraction (LVEF) < 25% and, have demonstrated functional limitation with a peak oxygen consumption of <14 ml/kg/min unless balloon pump or inotrope dependent or physically unable to perform the test.         *EF <20% By echo (date)  03/13/2020       3)  Social work and palliative care evaluations demonstrate appropriate support system in place for discharge to home with a VAD and that end of life discussions have taken place. Both services have expressed no concern regarding patient's candidacy.         *Social work consult (date): 03/23/2020        *Palliative Care Consult (date): 03/19/2020  4)  Primary caretaker identified that can be taught along with the patient how to manage        the VAD equipment.        *Name: Pricilla Loveless  5)  Deemed appropriate by our financial coordinator: Cecilio Asper        Prior approval: [id number]  6)  VAD Coordinator, Tanda Rockers has met with patient and caregiver, shown them the VAD equipment and discussed with the patient and caregiver about lifestyle changes necessary for success on mechanical circulatory device.        *Met with Tanda Rockers on 03/13/2020.        *Consent for VAD Evaluation/Caregiver Agreement/HIPPA Release/Photo Release signed on 03/19/2020  7)  Patient has been discussed with [place] (Dr.Devore) and felt to be an appropriate candidate for Destination Therapy LVAD due to age.   8)   Intermacs profile: 3  INTERMACS 1: Critical cardiogenic shock describes a patient who is "crashing and burning", in which a patient has life-threatening hypotension and rapidly escalating inotropic pressor support, with critical organ hypoperfusion often confirmed by worsening acidosis and lactate levels.  INTERMACS 2: Progressive decline describes a patient who has been demonstrated "dependent" on inotropic support but nonetheless shows signs of continuing deterioration in nutrition, renal function, fluid retention, or other major status indicator. Patient profile 2 can also describe a patient with refractory volume overload, perhaps with evidence of impaired perfusion, in whom inotropic infusions cannot be maintained due to tachyarrhythmias, clinical ischemia, or other intolerance.  INTERMACS 3: Stable but inotrope dependent describes a patient who is clinically stable on mild-moderate doses of intravenous inotropes (or has a temporary circulatory support device) after repeated documentation of failure to wean without symptomatic hypotension, worsening symptoms, or progressive organ dysfunction (usually renal). It is critical to monitor nutrition, renal function, fluid balance, and overall status carefully in order to distinguish between a   patient who is truly stable at Patient Profile 3 and a patient who has unappreciated decline rendering them Patient Profile 2. This patient may be either at home or in the hospital.      INTERMACS 4: Resting symptoms describes a patient who is at home on oral therapy but frequently has symptoms of  congestion at rest or with activities of daily living (ADL). He or she may have orthopnea, shortness of breath during ADL such as dressing or bathing, gastrointestinal symptoms (abdominal discomfort, nausea, poor appetite), disabling ascites or severe lower extremity edema. This patient should be carefully considered for more intensive management and surveillance  programs, which may in some cases, reveal poor compliance that would compromise outcomes with any therapy.   .   INTERMACS 5: Exertion Intolerant describes a patient who is comfortable at rest but unable to engage in any activity, living predominantly within the house or housebound. This patient has no congestive symptoms, but may have chronically elevated volume status, frequently with renal dysfunction, and may be characterized as exercise intolerant.      INTERMACS 6: Exertion Limited also describes a patient who is comfortable at rest without evidence of fluid overload, but who is able to do some mild activity. Activities of daily living are comfortable and minor activities outside the home such as visiting friends or going to a restaurant can be performed, but fatigue results within a few minutes of any meaningful physical exertion. This patient has occasional episodes of worsening symptoms and is likely to have had a hospitalization for heart failure within the past year.   INTERMACS 7: Advanced NYHA Class 3 describes a patient who is clinically stable with a reasonable level of comfortable activity, despite history of previous decompensation that is not recent. This patient is usually able to walk more than a block. Any decompensation requiring intravenous diuretics or hospitalization within the previous month should make this person a Patient Profile 6 or lower.     9)  NYHA Class: IV   Emerson Monte RN [place] Coordinator  Office: [phone]  24/7 Pager: [phone]

## 2020-03-19 NOTE — Progress Notes (Signed)
Advanced Heart Failure Rounding Note   Subjective:    Swan placed 4/3. Initial co-ox 39%  Now on milrinone 0.25. Off NE.  Co-ox 71%   Underwent teeth extraction on 4/6. Still a bit sore but otherwise ok. No SOB, orthopnea or PND. Creatinine down to 1.25   Swan#s this am: CVP 11 PA 49/23 PCWP 22 Thermo 6.7/2. (CI by FICK calculation 2.64L/min/m2) PaPI 2.36    Objective:   Weight Range:  Vital Signs:   Temp:  [96.4 F (35.8 C)-99 F (37.2 C)] 98.2 F (36.8 C) (04/07 0754) Pulse Rate:  [72-98] 95 (04/07 0754) Resp:  [11-16] 11 (04/06 1700) BP: (116-168)/(76-123) 146/99 (04/07 0700) SpO2:  [92 %-98 %] 93 % (04/07 0754) Last BM Date: 03/23/2020  Weight change: Filed Weights   03/16/20 0500 03/28/2020 0459 03/15/2020 0400  Weight: 112.7 kg 110.1 kg 108.1 kg    Intake/Output:   Intake/Output Summary (Last 24 hours) at 03/19/2020 0809 Last data filed at 03/19/2020 0400 Gross per 24 hour  Intake 216.34 ml  Output 2630 ml  Net -2413.66 ml     Physical Exam: CVP 11 General: Sitting up in bed.  HEENT: normal. s/p teeth extraction  Neck: supple. RIJ swan. Carotids 2+ bilat; no bruits. No lymphadenopathy or thryomegaly appreciated. Cor: PMI nondisplaced. Irregular rate & rhythm. No rubs, gallops or murmurs. Lungs: clear Abdomen: soft, nontender, nondistended. No hepatosplenomegaly. No bruits or masses. Good bowel sounds. Extremities: no cyanosis, clubbing, rash, edema Neuro: alert & orientedx3, cranial nerves grossly intact. moves all 4 extremities w/o difficulty. Affect pleasant    Telemetry: AF 80-90s Personally reviewed   Labs: Basic Metabolic Panel: Recent Labs  Lab 03/15/20 0311 03/15/20 0311 03/16/20 0433 03/16/20 0433 04/07/2020 0159 03/23/2020 0352 03/19/20 0452  NA 139  --  137  --  136 138 137  K 3.2*  --  3.7  --  3.6 4.0 3.9  CL 102  --  104  --  101 103 101  CO2 24  --  22  --  26 26 27   GLUCOSE 152*  --  232*  --  260* 250* 209*  BUN 33*  --   34*  --  29* 22 15  CREATININE 2.22*  --  2.03*  --  2.10* 1.56* 1.25*  CALCIUM 8.9   < > 8.5*   < > 8.7* 9.1 9.1   < > = values in this interval not displayed.    Liver Function Tests: Recent Labs  Lab 03/15/20 0311 04/09/2020 0352  AST 21 16  ALT 21 18  ALKPHOS 72 64  BILITOT 1.0 0.9  PROT 5.3* 5.8*  ALBUMIN 3.2* 3.3*   No results for input(s): LIPASE, AMYLASE in the last 168 hours. No results for input(s): AMMONIA in the last 168 hours.  CBC: Recent Labs  Lab 03/13/20 0331 03/15/20 0311 04/10/2020 0159 04/10/2020 0352 03/19/20 0452  WBC 5.1 4.2 5.6 4.5 6.6  HGB 12.5* 12.0* 11.5* 10.9* 11.4*  HCT 37.6* 36.3* 34.6* 33.9* 34.2*  MCV 92.2 92.8 92.0 92.6 92.7  PLT 165 151 160 138* 141*    Cardiac Enzymes: No results for input(s): CKTOTAL, CKMB, CKMBINDEX, TROPONINI in the last 168 hours.  BNP: BNP (last 3 results) Recent Labs    01/04/20 1642  BNP 165.2*    ProBNP (last 3 results) No results for input(s): PROBNP in the last 8760 hours.    Other results:  Imaging: CT ABDOMEN PELVIS WO CONTRAST  Result Date: 03/17/2020  CLINICAL DATA:  69 year old male with family history of colon cancer. Screening. EXAM: CT ABDOMEN AND PELVIS WITHOUT CONTRAST TECHNIQUE: Multidetector CT imaging of the abdomen and pelvis was performed following the standard protocol without IV contrast. COMPARISON:  None. FINDINGS: Evaluation of this exam is limited in the absence of intravenous contrast. Lower chest: Probable trace left pleural effusion versus pleural thickening. There are bibasilar atelectasis/scarring. Cardiomegaly. Multi vessel coronary vascular calcification and cardiac pacemaker wires. No intra-abdominal free air. Small perihepatic ascites. Hepatobiliary: Morphologic changes of early cirrhosis. No intrahepatic biliary ductal dilatation. There are multiple stones within the gallbladder. No pericholecystic fluid or evidence of acute cholecystitis by CT. Pancreas: Mild pancreatic  atrophy. No active inflammation. Spleen: Normal in size without focal abnormality. Adrenals/Urinary Tract: The adrenal glands are unremarkable. There is no hydronephrosis or nephrolithiasis. The visualized ureters and urinary bladder appear unremarkable. Stomach/Bowel: Small hiatal hernia. There is no bowel obstruction or active inflammation. The appendix is unremarkable. Vascular/Lymphatic: Advanced aortoiliac atherosclerotic disease. The IVC is grossly unremarkable on this noncontrast CT. No portal venous gas. There is no adenopathy. Reproductive: The prostate and seminal vesicles are grossly unremarkable. Other: Small fat containing bilateral inguinal hernias. Compression noted over the right groin. Musculoskeletal: Osteopenia with degenerative changes of the spine. No acute osseous pathology. IMPRESSION: 1. No acute intra-abdominal or pelvic pathology. No bowel obstruction. Normal appendix. 2. Cirrhosis with small perihepatic ascites. 3. Cholelithiasis. No evidence of acute cholecystitis by CT. 4. Aortic Atherosclerosis (ICD10-I70.0). Electronically Signed   By: Anner Crete M.D.   On: 03/27/2020 17:16   DG Orthopantogram  Result Date: 03/25/2020 CLINICAL DATA:  Evaluate for possible periapical abscess EXAM: ORTHOPANTOGRAM/PANORAMIC COMPARISON:  None. FINDINGS: Panoramic view of the mandible demonstrates no acute fracture or dislocation. The patient is almost edentulous with only 4 residual teeth in the mandible on the left. No periapical abscess is noted. No significant dental caries are seen. No other focal abnormality is noted. IMPRESSION: No definitive abscess is noted. Electronically Signed   By: Inez Catalina M.D.   On: 03/26/2020 14:25   US RENAL  Result Date: 03/14/2020 CLINICAL DATA:  Acute kidney injury EXAM: RENAL / URINARY TRACT ULTRASOUND COMPLETE COMPARISON:  None. FINDINGS: Right Kidney: Renal measurements: 11.2 x 4.6 x 5.6 cm = volume: 151 mL. Increased cortical echogenicity. No mass or  hydronephrosis visualized. Left Kidney: Renal measurements: 10.7 x 6.1 x 6.1 cm = volume: 208 mL. Increased cortical echogenicity. No mass or hydronephrosis visualized. Bladder: Appears normal for degree of bladder distention. Other: None. IMPRESSION: Normal size of the kidneys. No hydronephrosis. Increased cortical echogenicity, in keeping with medical renal disease. Electronically Signed   By: Eddie Candle M.D.   On: 03/23/2020 11:10     Medications:     Scheduled Medications: . amiodarone  200 mg Oral Daily  . aspirin  81 mg Oral Daily  . atorvastatin  80 mg Oral q1800  . Chlorhexidine Gluconate Cloth  6 each Topical Daily  . citalopram  20 mg Oral Daily  . influenza vaccine adjuvanted  0.5 mL Intramuscular Tomorrow-1000  . insulin aspart  0-20 Units Subcutaneous TID WC  . insulin aspart  0-5 Units Subcutaneous QHS  . insulin glargine  10 Units Subcutaneous Daily  . pantoprazole  40 mg Oral Daily  . sodium chloride flush  10-40 mL Intracatheter Q12H  . tamsulosin  0.4 mg Oral Daily    Infusions: . sodium chloride    . heparin 1,100 Units/hr (03/19/20 0100)  . milrinone 0.25 mcg/kg/min (  03/19/20 0100)  . norepinephrine (LEVOPHED) Adult infusion Stopped (03/16/20 1657)    PRN Medications: acetaminophen, ALPRAZolam, HYDROcodone-acetaminophen, nitroGLYCERIN, ondansetron (ZOFRAN) IV, sodium chloride flush   Assessment/Plan:   1. Acute on chronic systolic HF -> cardiogenic shock  - Echo 2016 EF 30-35% - Echo 1/21 EF 25% - Echo 4/21 EF 10-15% moderate RV dysfunction - cath 3/21 with severe 3v CAD and low output with CI 1.7  - now with NYHA IV symptoms and AKI with attempts at diuresis.  - hold b-blocker - Swan placed 4/3. Initial co-ox 39%. Now on milrinone 0.25. Co-ox 71%  - Initial PAPi 1.5. PAPi today 2.36 on milrinone  - Initially plan for CABG but given need for re-do sternotomy, relatively poor targets and longstanding low EF suspect VAD may be better option  - S/p  teeth extractions today by Dr. Enrique Sack. Appreciate his assistance - Creatinine has improved. Patient discussed in multi-disciplinary VAD MRB meeting 4/6 and patient felt to be an appropriate candidate. Plan VAD 4/8 pending insurance approval  - Diurese today with lasix 80 IV bid. - Will d/w Dr. Orvan Seen about pulling swan today or leaving in for surgery  2. CAD with unstable angina - s/p previous PCI - cath 03/09/2020 with severe 3v CAD - No s/s angina Plan as above - continue ASA/statin  3. AKI on CKD 3a - baseline creatinine 1.3-1.5 -> 2.2 to AKI - Improving w/ hemodynamic support, down to 1.2 today  - Renal u/s 4/5 unremarkable   4. Permanent AF - rate controlled. On Eliquis at home - Continue heparin. No bleeding - Discussed dosing with PharmD personally.  5. MVP s/p MV repair - now with recurrent severe MR on echo and with huge v-waves on PCWP tracing - plan VAD  6. DM2, poorly controlled - hgbA1c 11.1% - continue insulin and SSI    Length of Stay: 7   Donyale Berthold MD 03/19/2020, 8:09 AM  Advanced Heart Failure Team Pager [phone] (M-F; 7a - 4p)  Please contact [facility] for night-coverage after hours (4p -7a ) and weekends on [email or web address]

## 2020-03-19 NOTE — Telephone Encounter (Signed)
Spoke with pt regarding appt on 03/21/20. Pt stated he's in the hospital waiting to have surgery, and would like to cancel his appt. Pt was advise that his appt will be cancelled and to call to reschedule when he's ready. Pt agreed. Pt questions and concerns were address.

## 2020-03-20 ENCOUNTER — Inpatient Hospital Stay (HOSPITAL_COMMUNITY): Admission: AD | Disposition: E | Payer: Self-pay | Attending: Cardiothoracic Surgery

## 2020-03-20 ENCOUNTER — Inpatient Hospital Stay (HOSPITAL_COMMUNITY): Payer: Medicare HMO

## 2020-03-20 ENCOUNTER — Inpatient Hospital Stay (HOSPITAL_COMMUNITY): Payer: Medicare HMO | Admitting: Critical Care Medicine

## 2020-03-20 DIAGNOSIS — Z95811 Presence of heart assist device: Secondary | ICD-10-CM

## 2020-03-20 DIAGNOSIS — I480 Paroxysmal atrial fibrillation: Secondary | ICD-10-CM

## 2020-03-20 DIAGNOSIS — R570 Cardiogenic shock: Secondary | ICD-10-CM | POA: Diagnosis not present

## 2020-03-20 DIAGNOSIS — I420 Dilated cardiomyopathy: Secondary | ICD-10-CM

## 2020-03-20 DIAGNOSIS — I255 Ischemic cardiomyopathy: Secondary | ICD-10-CM

## 2020-03-20 HISTORY — PX: CLIPPING OF ATRIAL APPENDAGE: SHX5773

## 2020-03-20 HISTORY — PX: TEE WITHOUT CARDIOVERSION: SHX5443

## 2020-03-20 HISTORY — DX: Dilated cardiomyopathy: I42.0

## 2020-03-20 HISTORY — PX: MAZE: SHX5063

## 2020-03-20 HISTORY — DX: Ischemic cardiomyopathy: I25.5

## 2020-03-20 HISTORY — PX: INSERTION OF IMPLANTABLE LEFT VENTRICULAR ASSIST DEVICE: SHX5866

## 2020-03-20 LAB — POCT I-STAT, CHEM 8
BUN: 22 mg/dL (ref 8–23)
BUN: 21 mg/dL (ref 8–23)
BUN: 21 mg/dL (ref 8–23)
BUN: 21 mg/dL (ref 8–23)
BUN: 21 mg/dL (ref 8–23)
BUN: 23 mg/dL (ref 8–23)
BUN: 21 mg/dL (ref 8–23)
BUN: 20 mg/dL (ref 8–23)
Calcium, Ion: 1.25 mmol/L (ref 1.15–1.40)
Calcium, Ion: 1.26 mmol/L (ref 1.15–1.40)
Calcium, Ion: 1.27 mmol/L (ref 1.15–1.40)
Calcium, Ion: 1.17 mmol/L (ref 1.15–1.40)
Calcium, Ion: 1.14 mmol/L — ABNORMAL LOW (ref 1.15–1.40)
Calcium, Ion: 1.15 mmol/L (ref 1.15–1.40)
Calcium, Ion: 1.3 mmol/L (ref 1.15–1.40)
Calcium, Ion: 1.2 mmol/L (ref 1.15–1.40)
Chloride: 94 mmol/L — ABNORMAL LOW (ref 98–111)
Chloride: 94 mmol/L — ABNORMAL LOW (ref 98–111)
Chloride: 95 mmol/L — ABNORMAL LOW (ref 98–111)
Chloride: 93 mmol/L — ABNORMAL LOW (ref 98–111)
Chloride: 94 mmol/L — ABNORMAL LOW (ref 98–111)
Chloride: 95 mmol/L — ABNORMAL LOW (ref 98–111)
Chloride: 97 mmol/L — ABNORMAL LOW (ref 98–111)
Chloride: 100 mmol/L (ref 98–111)
Creatinine, Ser: 1.2 mg/dL (ref 0.61–1.24)
Creatinine, Ser: 0.9 mg/dL (ref 0.61–1.24)
Creatinine, Ser: 1.2 mg/dL (ref 0.61–1.24)
Creatinine, Ser: 1.4 mg/dL — ABNORMAL HIGH (ref 0.61–1.24)
Creatinine, Ser: 1.3 mg/dL — ABNORMAL HIGH (ref 0.61–1.24)
Creatinine, Ser: 1.4 mg/dL — ABNORMAL HIGH (ref 0.61–1.24)
Creatinine, Ser: 0.7 mg/dL (ref 0.61–1.24)
Creatinine, Ser: 1.4 mg/dL — ABNORMAL HIGH (ref 0.61–1.24)
Glucose, Bld: 276 mg/dL — ABNORMAL HIGH (ref 70–99)
Glucose, Bld: 220 mg/dL — ABNORMAL HIGH (ref 70–99)
Glucose, Bld: 249 mg/dL — ABNORMAL HIGH (ref 70–99)
Glucose, Bld: 187 mg/dL — ABNORMAL HIGH (ref 70–99)
Glucose, Bld: 176 mg/dL — ABNORMAL HIGH (ref 70–99)
Glucose, Bld: 176 mg/dL — ABNORMAL HIGH (ref 70–99)
Glucose, Bld: 188 mg/dL — ABNORMAL HIGH (ref 70–99)
Glucose, Bld: 211 mg/dL — ABNORMAL HIGH (ref 70–99)
HCT: 39 % (ref 39.0–52.0)
HCT: 34 % — ABNORMAL LOW (ref 39.0–52.0)
HCT: 34 % — ABNORMAL LOW (ref 39.0–52.0)
HCT: 29 % — ABNORMAL LOW (ref 39.0–52.0)
HCT: 26 % — ABNORMAL LOW (ref 39.0–52.0)
HCT: 31 % — ABNORMAL LOW (ref 39.0–52.0)
HCT: 31 % — ABNORMAL LOW (ref 39.0–52.0)
HCT: 27 % — ABNORMAL LOW (ref 39.0–52.0)
Hemoglobin: 13.3 g/dL (ref 13.0–17.0)
Hemoglobin: 11.6 g/dL — ABNORMAL LOW (ref 13.0–17.0)
Hemoglobin: 11.6 g/dL — ABNORMAL LOW (ref 13.0–17.0)
Hemoglobin: 9.9 g/dL — ABNORMAL LOW (ref 13.0–17.0)
Hemoglobin: 10.5 g/dL — ABNORMAL LOW (ref 13.0–17.0)
Hemoglobin: 8.8 g/dL — ABNORMAL LOW (ref 13.0–17.0)
Hemoglobin: 10.5 g/dL — ABNORMAL LOW (ref 13.0–17.0)
Hemoglobin: 9.2 g/dL — ABNORMAL LOW (ref 13.0–17.0)
Potassium: 3.9 mmol/L (ref 3.5–5.1)
Potassium: 3.7 mmol/L (ref 3.5–5.1)
Potassium: 3.8 mmol/L (ref 3.5–5.1)
Potassium: 3.9 mmol/L (ref 3.5–5.1)
Potassium: 3.5 mmol/L (ref 3.5–5.1)
Potassium: 3.7 mmol/L (ref 3.5–5.1)
Potassium: 3 mmol/L — ABNORMAL LOW (ref 3.5–5.1)
Potassium: 3.7 mmol/L (ref 3.5–5.1)
Sodium: 134 mmol/L — ABNORMAL LOW (ref 135–145)
Sodium: 134 mmol/L — ABNORMAL LOW (ref 135–145)
Sodium: 134 mmol/L — ABNORMAL LOW (ref 135–145)
Sodium: 134 mmol/L — ABNORMAL LOW (ref 135–145)
Sodium: 136 mmol/L (ref 135–145)
Sodium: 136 mmol/L (ref 135–145)
Sodium: 137 mmol/L (ref 135–145)
Sodium: 136 mmol/L (ref 135–145)
TCO2: 33 mmol/L — ABNORMAL HIGH (ref 22–32)
TCO2: 32 mmol/L (ref 22–32)
TCO2: 33 mmol/L — ABNORMAL HIGH (ref 22–32)
TCO2: 33 mmol/L — ABNORMAL HIGH (ref 22–32)
TCO2: 32 mmol/L (ref 22–32)
TCO2: 35 mmol/L — ABNORMAL HIGH (ref 22–32)
TCO2: 33 mmol/L — ABNORMAL HIGH (ref 22–32)
TCO2: 23 mmol/L (ref 22–32)

## 2020-03-20 LAB — BASIC METABOLIC PANEL
Anion gap: 10 (ref 5–15)
Anion gap: 11 (ref 5–15)
Anion gap: 13 (ref 5–15)
BUN: 21 mg/dL (ref 8–23)
BUN: 19 mg/dL (ref 8–23)
BUN: 19 mg/dL (ref 8–23)
CO2: 29 mmol/L (ref 22–32)
CO2: 26 mmol/L (ref 22–32)
CO2: 22 mmol/L (ref 22–32)
Calcium: 9.4 mg/dL (ref 8.9–10.3)
Calcium: 8.6 mg/dL — ABNORMAL LOW (ref 8.9–10.3)
Calcium: 8.7 mg/dL — ABNORMAL LOW (ref 8.9–10.3)
Chloride: 94 mmol/L — ABNORMAL LOW (ref 98–111)
Chloride: 102 mmol/L (ref 98–111)
Chloride: 101 mmol/L (ref 98–111)
Creatinine, Ser: 1.45 mg/dL — ABNORMAL HIGH (ref 0.61–1.24)
Creatinine, Ser: 1.44 mg/dL — ABNORMAL HIGH (ref 0.61–1.24)
Creatinine, Ser: 1.58 mg/dL — ABNORMAL HIGH (ref 0.61–1.24)
GFR calc Af Amer: 56 mL/min — ABNORMAL LOW (ref >60)
GFR calc Af Amer: 57 mL/min — ABNORMAL LOW (ref >60)
GFR calc Af Amer: 51 mL/min — ABNORMAL LOW (ref >60)
GFR calc non Af Amer: 48 mL/min — ABNORMAL LOW (ref >60)
GFR calc non Af Amer: 49 mL/min — ABNORMAL LOW (ref >60)
GFR calc non Af Amer: 44 mL/min — ABNORMAL LOW (ref >60)
Glucose, Bld: 291 mg/dL — ABNORMAL HIGH (ref 70–99)
Glucose, Bld: 180 mg/dL — ABNORMAL HIGH (ref 70–99)
Glucose, Bld: 209 mg/dL — ABNORMAL HIGH (ref 70–99)
Potassium: 3.7 mmol/L (ref 3.5–5.1)
Potassium: 3 mmol/L — ABNORMAL LOW (ref 3.5–5.1)
Potassium: 3.8 mmol/L (ref 3.5–5.1)
Sodium: 133 mmol/L — ABNORMAL LOW (ref 135–145)
Sodium: 139 mmol/L (ref 135–145)
Sodium: 136 mmol/L (ref 135–145)

## 2020-03-20 LAB — CBC
HCT: 38.3 % — ABNORMAL LOW (ref 39.0–52.0)
HCT: 28.7 % — ABNORMAL LOW (ref 39.0–52.0)
HCT: 29.7 % — ABNORMAL LOW (ref 39.0–52.0)
Hemoglobin: 13 g/dL (ref 13.0–17.0)
Hemoglobin: 9.4 g/dL — ABNORMAL LOW (ref 13.0–17.0)
Hemoglobin: 9.7 g/dL — ABNORMAL LOW (ref 13.0–17.0)
MCH: 30.3 pg (ref 26.0–34.0)
MCH: 30.2 pg (ref 26.0–34.0)
MCH: 30.2 pg (ref 26.0–34.0)
MCHC: 33.9 g/dL (ref 30.0–36.0)
MCHC: 32.8 g/dL (ref 30.0–36.0)
MCHC: 32.7 g/dL (ref 30.0–36.0)
MCV: 89.3 fL (ref 80.0–100.0)
MCV: 92.3 fL (ref 80.0–100.0)
MCV: 92.5 fL (ref 80.0–100.0)
Platelets: 173 10*3/uL (ref 150–400)
Platelets: 108 10*3/uL — ABNORMAL LOW (ref 150–400)
Platelets: 112 10*3/uL — ABNORMAL LOW (ref 150–400)
RBC: 4.29 MIL/uL (ref 4.22–5.81)
RBC: 3.11 MIL/uL — ABNORMAL LOW (ref 4.22–5.81)
RBC: 3.21 MIL/uL — ABNORMAL LOW (ref 4.22–5.81)
RDW: 13.3 % (ref 11.5–15.5)
RDW: 13.4 % (ref 11.5–15.5)
RDW: 13.4 % (ref 11.5–15.5)
WBC: 8 10*3/uL (ref 4.0–10.5)
WBC: 10.1 10*3/uL (ref 4.0–10.5)
WBC: 12.9 10*3/uL — ABNORMAL HIGH (ref 4.0–10.5)
nRBC: 0 % (ref 0.0–0.2)
nRBC: 0 % (ref 0.0–0.2)
nRBC: 0 % (ref 0.0–0.2)

## 2020-03-20 LAB — POCT I-STAT 7, (LYTES, BLD GAS, ICA,H+H)
Acid-Base Excess: 9 mmol/L — ABNORMAL HIGH (ref 0.0–2.0)
Acid-Base Excess: 7 mmol/L — ABNORMAL HIGH (ref 0.0–2.0)
Acid-Base Excess: 8 mmol/L — ABNORMAL HIGH (ref 0.0–2.0)
Acid-Base Excess: 7 mmol/L — ABNORMAL HIGH (ref 0.0–2.0)
Acid-Base Excess: 3 mmol/L — ABNORMAL HIGH (ref 0.0–2.0)
Acid-base deficit: 2 mmol/L (ref 0.0–2.0)
Bicarbonate: 33.3 mmol/L — ABNORMAL HIGH (ref 20.0–28.0)
Bicarbonate: 31.2 mmol/L — ABNORMAL HIGH (ref 20.0–28.0)
Bicarbonate: 30.6 mmol/L — ABNORMAL HIGH (ref 20.0–28.0)
Bicarbonate: 31.8 mmol/L — ABNORMAL HIGH (ref 20.0–28.0)
Bicarbonate: 28.6 mmol/L — ABNORMAL HIGH (ref 20.0–28.0)
Bicarbonate: 23.6 mmol/L (ref 20.0–28.0)
Calcium, Ion: 1.2 mmol/L (ref 1.15–1.40)
Calcium, Ion: 1.1 mmol/L — ABNORMAL LOW (ref 1.15–1.40)
Calcium, Ion: 1.12 mmol/L — ABNORMAL LOW (ref 1.15–1.40)
Calcium, Ion: 1.3 mmol/L (ref 1.15–1.40)
Calcium, Ion: 1.24 mmol/L (ref 1.15–1.40)
Calcium, Ion: 1.24 mmol/L (ref 1.15–1.40)
HCT: 37 % — ABNORMAL LOW (ref 39.0–52.0)
HCT: 30 % — ABNORMAL LOW (ref 39.0–52.0)
HCT: 32 % — ABNORMAL LOW (ref 39.0–52.0)
HCT: 30 % — ABNORMAL LOW (ref 39.0–52.0)
HCT: 27 % — ABNORMAL LOW (ref 39.0–52.0)
HCT: 28 % — ABNORMAL LOW (ref 39.0–52.0)
Hemoglobin: 12.6 g/dL — ABNORMAL LOW (ref 13.0–17.0)
Hemoglobin: 10.2 g/dL — ABNORMAL LOW (ref 13.0–17.0)
Hemoglobin: 10.9 g/dL — ABNORMAL LOW (ref 13.0–17.0)
Hemoglobin: 10.2 g/dL — ABNORMAL LOW (ref 13.0–17.0)
Hemoglobin: 9.2 g/dL — ABNORMAL LOW (ref 13.0–17.0)
Hemoglobin: 9.5 g/dL — ABNORMAL LOW (ref 13.0–17.0)
O2 Saturation: 100 %
O2 Saturation: 100 %
O2 Saturation: 100 %
O2 Saturation: 100 %
O2 Saturation: 99 %
O2 Saturation: 99 %
Patient temperature: 36.4
Potassium: 3.9 mmol/L (ref 3.5–5.1)
Potassium: 3.7 mmol/L (ref 3.5–5.1)
Potassium: 3.7 mmol/L (ref 3.5–5.1)
Potassium: 3 mmol/L — ABNORMAL LOW (ref 3.5–5.1)
Potassium: 3.1 mmol/L — ABNORMAL LOW (ref 3.5–5.1)
Potassium: 3.8 mmol/L (ref 3.5–5.1)
Sodium: 134 mmol/L — ABNORMAL LOW (ref 135–145)
Sodium: 136 mmol/L (ref 135–145)
Sodium: 136 mmol/L (ref 135–145)
Sodium: 138 mmol/L (ref 135–145)
Sodium: 139 mmol/L (ref 135–145)
Sodium: 137 mmol/L (ref 135–145)
TCO2: 35 mmol/L — ABNORMAL HIGH (ref 22–32)
TCO2: 32 mmol/L (ref 22–32)
TCO2: 32 mmol/L (ref 22–32)
TCO2: 33 mmol/L — ABNORMAL HIGH (ref 22–32)
TCO2: 30 mmol/L (ref 22–32)
TCO2: 25 mmol/L (ref 22–32)
pCO2 arterial: 42.6 mmHg (ref 32.0–48.0)
pCO2 arterial: 43.4 mmHg (ref 32.0–48.0)
pCO2 arterial: 35.9 mmHg (ref 32.0–48.0)
pCO2 arterial: 48 mmHg (ref 32.0–48.0)
pCO2 arterial: 48.4 mmHg — ABNORMAL HIGH (ref 32.0–48.0)
pCO2 arterial: 42.1 mmHg (ref 32.0–48.0)
pH, Arterial: 7.502 — ABNORMAL HIGH (ref 7.350–7.450)
pH, Arterial: 7.464 — ABNORMAL HIGH (ref 7.350–7.450)
pH, Arterial: 7.539 — ABNORMAL HIGH (ref 7.350–7.450)
pH, Arterial: 7.43 (ref 7.350–7.450)
pH, Arterial: 7.377 (ref 7.350–7.450)
pH, Arterial: 7.356 (ref 7.350–7.450)
pO2, Arterial: 473 mmHg — ABNORMAL HIGH (ref 83.0–108.0)
pO2, Arterial: 358 mmHg — ABNORMAL HIGH (ref 83.0–108.0)
pO2, Arterial: 333 mmHg — ABNORMAL HIGH (ref 83.0–108.0)
pO2, Arterial: 389 mmHg — ABNORMAL HIGH (ref 83.0–108.0)
pO2, Arterial: 133 mmHg — ABNORMAL HIGH (ref 83.0–108.0)
pO2, Arterial: 138 mmHg — ABNORMAL HIGH (ref 83.0–108.0)

## 2020-03-20 LAB — DIC (DISSEMINATED INTRAVASCULAR COAGULATION)PANEL
D-Dimer, Quant: 1.09 ug/mL-FEU — ABNORMAL HIGH (ref 0.00–0.50)
Fibrinogen: 367 mg/dL (ref 210–475)
INR: 1.5 — ABNORMAL HIGH (ref 0.8–1.2)
Platelets: 108 10*3/uL — ABNORMAL LOW (ref 150–400)
Prothrombin Time: 17.8 seconds — ABNORMAL HIGH (ref 11.4–15.2)
Smear Review: NONE SEEN
aPTT: 36 seconds (ref 24–36)

## 2020-03-20 LAB — HEMOGLOBIN AND HEMATOCRIT, BLOOD
HCT: 31.1 % — ABNORMAL LOW (ref 39.0–52.0)
Hemoglobin: 10.5 g/dL — ABNORMAL LOW (ref 13.0–17.0)

## 2020-03-20 LAB — COOXEMETRY PANEL
Carboxyhemoglobin: 2 % — ABNORMAL HIGH (ref 0.5–1.5)
Carboxyhemoglobin: 1.5 % (ref 0.5–1.5)
Carboxyhemoglobin: 1.5 % (ref 0.5–1.5)
Methemoglobin: 0.9 % (ref 0.0–1.5)
Methemoglobin: 1 % (ref 0.0–1.5)
Methemoglobin: 0.9 % (ref 0.0–1.5)
O2 Saturation: 63.8 %
O2 Saturation: 97.1 %
O2 Saturation: 76.5 %
Total hemoglobin: 13.4 g/dL (ref 12.0–16.0)
Total hemoglobin: 9.5 g/dL — ABNORMAL LOW (ref 12.0–16.0)
Total hemoglobin: 9.7 g/dL — ABNORMAL LOW (ref 12.0–16.0)

## 2020-03-20 LAB — GLUCOSE, CAPILLARY
Glucose-Capillary: 147 mg/dL — ABNORMAL HIGH (ref 70–99)
Glucose-Capillary: 162 mg/dL — ABNORMAL HIGH (ref 70–99)
Glucose-Capillary: 163 mg/dL — ABNORMAL HIGH (ref 70–99)
Glucose-Capillary: 171 mg/dL — ABNORMAL HIGH (ref 70–99)
Glucose-Capillary: 174 mg/dL — ABNORMAL HIGH (ref 70–99)
Glucose-Capillary: 195 mg/dL — ABNORMAL HIGH (ref 70–99)
Glucose-Capillary: 196 mg/dL — ABNORMAL HIGH (ref 70–99)
Glucose-Capillary: 199 mg/dL — ABNORMAL HIGH (ref 70–99)
Glucose-Capillary: 203 mg/dL — ABNORMAL HIGH (ref 70–99)
Glucose-Capillary: 212 mg/dL — ABNORMAL HIGH (ref 70–99)
Glucose-Capillary: 249 mg/dL — ABNORMAL HIGH (ref 70–99)
Glucose-Capillary: 345 mg/dL — ABNORMAL HIGH (ref 70–99)

## 2020-03-20 LAB — FACTOR 5 LEIDEN

## 2020-03-20 LAB — ECHO INTRAOPERATIVE TEE
Height: 72 in
Weight: 3633.18 oz

## 2020-03-20 LAB — PREPARE RBC (CROSSMATCH)

## 2020-03-20 LAB — MAGNESIUM
Magnesium: 1.6 mg/dL — ABNORMAL LOW (ref 1.7–2.4)
Magnesium: 2.4 mg/dL (ref 1.7–2.4)

## 2020-03-20 LAB — HEPARIN LEVEL (UNFRACTIONATED): Heparin Unfractionated: 0.3 IU/mL (ref 0.30–0.70)

## 2020-03-20 LAB — PLATELET COUNT: Platelets: 171 10*3/uL (ref 150–400)

## 2020-03-20 SURGERY — REDO STERNOTOMY
Anesthesia: General | Site: Chest

## 2020-03-20 MED ORDER — SODIUM CHLORIDE 0.9 % IR SOLN
Status: DC | PRN
  Administered 2020-03-20: 6000 mL
  Administered 2020-03-20: 1000 mL

## 2020-03-20 MED ORDER — ARTIFICIAL TEARS OPHTHALMIC OINT
TOPICAL_OINTMENT | OPHTHALMIC | Status: AC
  Filled 2020-03-20: qty 3.5

## 2020-03-20 MED ORDER — AMIODARONE HCL IN DEXTROSE 360-4.14 MG/200ML-% IV SOLN
INTRAVENOUS | Status: DC | PRN
  Administered 2020-03-20: 60 mg/h via INTRAVENOUS

## 2020-03-20 MED ORDER — INSULIN REGULAR(HUMAN) IN NACL 100-0.9 UT/100ML-% IV SOLN
INTRAVENOUS | Status: DC
  Administered 2020-03-20: 3.4 [IU]/h via INTRAVENOUS
  Administered 2020-03-21: 10.5 [IU]/h via INTRAVENOUS
  Administered 2020-03-22: 5 [IU]/h via INTRAVENOUS
  Administered 2020-03-23: 3.6 [IU]/h via INTRAVENOUS
  Administered 2020-03-25: 6 [IU]/h via INTRAVENOUS
  Filled 2020-03-20 (×6): qty 100

## 2020-03-20 MED ORDER — SODIUM CHLORIDE 0.9 % IV SOLN
600.0000 mg | Freq: Once | INTRAVENOUS | Status: AC
  Administered 2020-03-21: 08:00:00 600 mg via INTRAVENOUS
  Filled 2020-03-20: qty 600

## 2020-03-20 MED ORDER — SODIUM CHLORIDE 0.9 % IV SOLN: 250.0000 mL | INTRAVENOUS | Status: DC

## 2020-03-20 MED ORDER — ACETAMINOPHEN 500 MG PO TABS
1000.0000 mg | ORAL_TABLET | Freq: Four times a day (QID) | ORAL | Status: DC
  Administered 2020-03-21: 1000 mg via ORAL
  Filled 2020-03-20: qty 2

## 2020-03-20 MED ORDER — VECURONIUM BROMIDE 10 MG IV SOLR
INTRAVENOUS | Status: DC | PRN
  Administered 2020-03-20: 5 mg via INTRAVENOUS

## 2020-03-20 MED ORDER — VANCOMYCIN HCL 1000 MG IV SOLR
INTRAVENOUS | Status: DC | PRN
  Administered 2020-03-20: 2 g via TOPICAL

## 2020-03-20 MED ORDER — SODIUM CHLORIDE 0.9 % IV SOLN
INTRAVENOUS | Status: AC
  Filled 2020-03-20: qty 1.2

## 2020-03-20 MED ORDER — ONDANSETRON HCL 4 MG/2ML IJ SOLN
4.0000 mg | Freq: Four times a day (QID) | INTRAMUSCULAR | Status: DC | PRN
  Administered 2020-03-21 – 2020-05-03 (×33): 4 mg via INTRAVENOUS
  Filled 2020-03-20 (×30): qty 2

## 2020-03-20 MED ORDER — HEMOSTATIC AGENTS (NO CHARGE) OPTIME
TOPICAL | Status: DC | PRN
  Administered 2020-03-20: 1 via TOPICAL

## 2020-03-20 MED ORDER — POTASSIUM CHLORIDE 20 MEQ/15ML (10%) PO SOLN
20.0000 meq | ORAL | Status: AC
  Administered 2020-03-20: 20 meq
  Filled 2020-03-20: qty 15

## 2020-03-20 MED ORDER — SODIUM CHLORIDE 0.9% FLUSH
10.0000 mL | Freq: Two times a day (BID) | INTRAVENOUS | Status: DC
  Administered 2020-03-22 – 2020-03-27 (×7): 10 mL
  Administered 2020-03-28: 20 mL
  Administered 2020-03-28 – 2020-03-30 (×4): 10 mL
  Administered 2020-03-31: 30 mL
  Administered 2020-03-31 – 2020-04-08 (×9): 10 mL
  Administered 2020-04-08: 20 mL
  Administered 2020-04-09: 10 mL
  Administered 2020-04-10: 40 mL
  Administered 2020-04-10: 10 mL
  Administered 2020-04-11: 20 mL
  Administered 2020-04-11: 30 mL
  Administered 2020-04-12 (×2): 10 mL
  Administered 2020-04-13: 20 mL
  Administered 2020-04-13 – 2020-05-01 (×26): 10 mL

## 2020-03-20 MED ORDER — SODIUM CHLORIDE 0.9% FLUSH
3.0000 mL | Freq: Two times a day (BID) | INTRAVENOUS | Status: DC
  Administered 2020-03-21 – 2020-03-26 (×8): 3 mL via INTRAVENOUS
  Administered 2020-03-27: 10 mL via INTRAVENOUS
  Administered 2020-03-27 – 2020-03-28 (×2): 3 mL via INTRAVENOUS

## 2020-03-20 MED ORDER — ASPIRIN 300 MG RE SUPP: 300.0000 mg | Freq: Every day | RECTAL | Status: DC

## 2020-03-20 MED ORDER — LACTATED RINGERS IV SOLN: INTRAVENOUS | Status: DC | PRN

## 2020-03-20 MED ORDER — AMIODARONE IV BOLUS ONLY 150 MG/100ML
INTRAVENOUS | Status: DC | PRN
  Administered 2020-03-20: 150 mg via INTRAVENOUS

## 2020-03-20 MED ORDER — ASPIRIN EC 325 MG PO TBEC
325.0000 mg | DELAYED_RELEASE_TABLET | Freq: Every day | ORAL | Status: DC
  Administered 2020-03-22: 325 mg via ORAL
  Filled 2020-03-20: qty 1

## 2020-03-20 MED ORDER — SODIUM CHLORIDE 0.9% FLUSH
3.0000 mL | INTRAVENOUS | Status: DC | PRN
  Administered 2020-03-20: 3 mL via INTRAVENOUS

## 2020-03-20 MED ORDER — ACETAMINOPHEN 650 MG RE SUPP
650.0000 mg | Freq: Once | RECTAL | Status: AC
  Administered 2020-03-20: 650 mg via RECTAL

## 2020-03-20 MED ORDER — LACTATED RINGERS IV SOLN: INTRAVENOUS | Status: DC

## 2020-03-20 MED ORDER — SODIUM CHLORIDE (PF) 0.9 % IJ SOLN
OROMUCOSAL | Status: DC | PRN
  Administered 2020-03-20: 4 mL via TOPICAL

## 2020-03-20 MED ORDER — EPINEPHRINE HCL 5 MG/250ML IV SOLN IN NS
0.5000 ug/min | INTRAVENOUS | Status: DC
  Administered 2020-03-21: 3 ug/min via INTRAVENOUS
  Filled 2020-03-20: qty 250

## 2020-03-20 MED ORDER — SODIUM CHLORIDE 0.9 % IV SOLN
1.5000 g | Freq: Two times a day (BID) | INTRAVENOUS | Status: AC
  Administered 2020-03-20 – 2020-03-22 (×4): 1.5 g via INTRAVENOUS
  Filled 2020-03-20 (×4): qty 1.5

## 2020-03-20 MED ORDER — SODIUM CHLORIDE 0.9% IV SOLUTION: Freq: Once | INTRAVENOUS | Status: DC

## 2020-03-20 MED ORDER — CHLORHEXIDINE GLUCONATE 0.12% ORAL RINSE (MEDLINE KIT)
15.0000 mL | Freq: Two times a day (BID) | OROMUCOSAL | Status: DC
  Administered 2020-03-20 – 2020-03-22 (×4): 15 mL via OROMUCOSAL

## 2020-03-20 MED ORDER — FAMOTIDINE IN NACL 20-0.9 MG/50ML-% IV SOLN
20.0000 mg | Freq: Two times a day (BID) | INTRAVENOUS | Status: AC
  Administered 2020-03-20 (×2): 20 mg via INTRAVENOUS
  Filled 2020-03-20: qty 50

## 2020-03-20 MED ORDER — MIDAZOLAM HCL 5 MG/5ML IJ SOLN
INTRAMUSCULAR | Status: DC | PRN
  Administered 2020-03-20: 2 mg via INTRAVENOUS
  Administered 2020-03-20 (×2): 1 mg via INTRAVENOUS
  Administered 2020-03-20: 2 mg via INTRAVENOUS
  Administered 2020-03-20 (×2): 1 mg via INTRAVENOUS
  Administered 2020-03-20 (×2): 2 mg via INTRAVENOUS

## 2020-03-20 MED ORDER — PROTAMINE SULFATE 10 MG/ML IV SOLN
INTRAVENOUS | Status: DC | PRN
  Administered 2020-03-20: 50 mg via INTRAVENOUS
  Administered 2020-03-20: 300 mg via INTRAVENOUS

## 2020-03-20 MED ORDER — CHLORHEXIDINE GLUCONATE 0.12 % MT SOLN
15.0000 mL | OROMUCOSAL | Status: AC
  Administered 2020-03-20: 15 mL via OROMUCOSAL

## 2020-03-20 MED ORDER — SODIUM CHLORIDE 0.9 % IV SOLN: INTRAVENOUS | Status: DC

## 2020-03-20 MED ORDER — DOCUSATE SODIUM 100 MG PO CAPS
200.0000 mg | ORAL_CAPSULE | Freq: Every day | ORAL | Status: DC
  Administered 2020-03-22 – 2020-03-24 (×3): 200 mg via ORAL
  Filled 2020-03-20 (×3): qty 2

## 2020-03-20 MED ORDER — TRAMADOL HCL 50 MG PO TABS
50.0000 mg | ORAL_TABLET | ORAL | Status: DC | PRN
  Administered 2020-03-22 – 2020-03-23 (×3): 100 mg via ORAL
  Filled 2020-03-20 (×3): qty 2

## 2020-03-20 MED ORDER — DEXMEDETOMIDINE HCL IN NACL 400 MCG/100ML IV SOLN
0.0000 ug/kg/h | INTRAVENOUS | Status: DC
  Administered 2020-03-20 – 2020-03-21 (×2): 0.7 ug/kg/h via INTRAVENOUS
  Filled 2020-03-20 (×2): qty 100

## 2020-03-20 MED ORDER — PROPOFOL 10 MG/ML IV BOLUS
INTRAVENOUS | Status: AC
  Filled 2020-03-20: qty 20

## 2020-03-20 MED ORDER — DEXTROSE 50 % IV SOLN
0.0000 mL | INTRAVENOUS | Status: DC | PRN
  Administered 2020-04-10: 08:00:00 25 mL via INTRAVENOUS
  Administered 2020-04-19: 50 mL via INTRAVENOUS
  Filled 2020-03-20 (×2): qty 50

## 2020-03-20 MED ORDER — MILRINONE LACTATE IN DEXTROSE 20-5 MG/100ML-% IV SOLN
0.3750 ug/kg/min | INTRAVENOUS | Status: DC
  Administered 2020-03-20 – 2020-03-21 (×3): 0.25 ug/kg/min via INTRAVENOUS
  Administered 2020-03-22: 0.375 ug/kg/min via INTRAVENOUS
  Administered 2020-03-22: 0.25 ug/kg/min via INTRAVENOUS
  Administered 2020-03-23 – 2020-03-30 (×20): 0.375 ug/kg/min via INTRAVENOUS
  Filled 2020-03-20 (×24): qty 100

## 2020-03-20 MED ORDER — SODIUM CHLORIDE 0.9 % IV SOLN
INTRAVENOUS | Status: DC | PRN
  Administered 2020-03-20: 500 mL

## 2020-03-20 MED ORDER — HEPARIN SODIUM (PORCINE) 1000 UNIT/ML IJ SOLN
INTRAMUSCULAR | Status: AC
  Filled 2020-03-20: qty 1

## 2020-03-20 MED ORDER — NOREPINEPHRINE 4 MG/250ML-% IV SOLN
0.0000 ug/min | INTRAVENOUS | Status: DC
  Administered 2020-03-20: 20:00:00 4 ug/min via INTRAVENOUS
  Administered 2020-04-01: 12:00:00 12 ug/min via INTRAVENOUS
  Administered 2020-04-01 – 2020-04-02 (×2): 10 ug/min via INTRAVENOUS
  Filled 2020-03-20 (×6): qty 250

## 2020-03-20 MED ORDER — HEPARIN SODIUM (PORCINE) 1000 UNIT/ML IJ SOLN
INTRAMUSCULAR | Status: DC | PRN
  Administered 2020-03-20: 32000 [IU] via INTRAVENOUS

## 2020-03-20 MED ORDER — LACTATED RINGERS IV SOLN: 500.0000 mL | Freq: Once | INTRAVENOUS | Status: DC | PRN

## 2020-03-20 MED ORDER — ROCURONIUM BROMIDE 10 MG/ML (PF) SYRINGE
PREFILLED_SYRINGE | INTRAVENOUS | Status: DC | PRN
  Administered 2020-03-20 (×2): 50 mg via INTRAVENOUS
  Administered 2020-03-20: 60 mg via INTRAVENOUS

## 2020-03-20 MED ORDER — AMIODARONE HCL IN DEXTROSE 360-4.14 MG/200ML-% IV SOLN
30.0000 mg/h | INTRAVENOUS | Status: DC
  Administered 2020-03-20: 30 mg/h via INTRAVENOUS
  Administered 2020-03-21: 60 mg/h via INTRAVENOUS
  Administered 2020-03-21: 12:00:00 30 mg/h via INTRAVENOUS
  Administered 2020-03-22 (×4): 60 mg/h via INTRAVENOUS
  Administered 2020-03-23: 30 mg/h via INTRAVENOUS
  Administered 2020-03-23: 60 mg/h via INTRAVENOUS
  Administered 2020-03-24 – 2020-03-29 (×11): 30 mg/h via INTRAVENOUS
  Filled 2020-03-20 (×21): qty 200

## 2020-03-20 MED ORDER — OXYCODONE HCL 5 MG PO TABS
5.0000 mg | ORAL_TABLET | ORAL | Status: DC | PRN
  Administered 2020-03-20 – 2020-03-22 (×2): 10 mg via ORAL
  Administered 2020-03-23: 5 mg via ORAL
  Filled 2020-03-20: qty 2
  Filled 2020-03-20: qty 1
  Filled 2020-03-20: qty 2

## 2020-03-20 MED ORDER — ROCURONIUM BROMIDE 10 MG/ML (PF) SYRINGE
PREFILLED_SYRINGE | INTRAVENOUS | Status: AC
  Filled 2020-03-20: qty 30

## 2020-03-20 MED ORDER — ARTIFICIAL TEARS OPHTHALMIC OINT
TOPICAL_OINTMENT | OPHTHALMIC | Status: DC | PRN
  Administered 2020-03-20: 1 via OPHTHALMIC

## 2020-03-20 MED ORDER — VANCOMYCIN HCL 1000 MG IV SOLR
INTRAVENOUS | Status: AC
  Filled 2020-03-20: qty 5000

## 2020-03-20 MED ORDER — VANCOMYCIN HCL 1000 MG IV SOLR
INTRAVENOUS | Status: DC | PRN
  Administered 2020-03-20: 1000 mL

## 2020-03-20 MED ORDER — MORPHINE SULFATE (PF) 2 MG/ML IV SOLN
1.0000 mg | INTRAVENOUS | Status: DC | PRN
  Administered 2020-03-20 – 2020-03-21 (×2): 2 mg via INTRAVENOUS
  Administered 2020-03-21: 19:00:00 4 mg via INTRAVENOUS
  Administered 2020-03-22 (×3): 2 mg via INTRAVENOUS
  Filled 2020-03-20: qty 1
  Filled 2020-03-20: qty 2
  Filled 2020-03-20 (×4): qty 1

## 2020-03-20 MED ORDER — ACETAMINOPHEN 160 MG/5ML PO SOLN
1000.0000 mg | Freq: Four times a day (QID) | ORAL | Status: DC
  Administered 2020-03-20 – 2020-03-21 (×4): 1000 mg
  Filled 2020-03-20 (×4): qty 40.6

## 2020-03-20 MED ORDER — SODIUM CHLORIDE 0.9 % IV SOLN: INTRAVENOUS | Status: DC | PRN

## 2020-03-20 MED ORDER — EPINEPHRINE PF 1 MG/ML IJ SOLN
0.0000 ug/min | INTRAVENOUS | Status: DC
  Filled 2020-03-20: qty 4

## 2020-03-20 MED ORDER — VASOPRESSIN 20 UNIT/ML IV SOLN
INTRAVENOUS | Status: AC
  Filled 2020-03-20: qty 1

## 2020-03-20 MED ORDER — ETOMIDATE 2 MG/ML IV SOLN
INTRAVENOUS | Status: DC | PRN
  Administered 2020-03-20: 14 mg via INTRAVENOUS

## 2020-03-20 MED ORDER — MIDAZOLAM HCL (PF) 10 MG/2ML IJ SOLN
INTRAMUSCULAR | Status: AC
  Filled 2020-03-20: qty 2

## 2020-03-20 MED ORDER — MAGNESIUM SULFATE 4 GM/100ML IV SOLN
4.0000 g | Freq: Once | INTRAVENOUS | Status: AC
  Administered 2020-03-20: 4 g via INTRAVENOUS

## 2020-03-20 MED ORDER — AMIODARONE HCL IN DEXTROSE 360-4.14 MG/200ML-% IV SOLN
60.0000 mg/h | INTRAVENOUS | Status: AC
  Administered 2020-03-20 (×2): 60 mg/h via INTRAVENOUS

## 2020-03-20 MED ORDER — SODIUM CHLORIDE (PF) 0.9 % IJ SOLN
INTRAMUSCULAR | Status: AC
  Filled 2020-03-20: qty 10

## 2020-03-20 MED ORDER — ACETAMINOPHEN 160 MG/5ML PO SOLN: 650.0000 mg | Freq: Once | ORAL | Status: AC

## 2020-03-20 MED ORDER — ASPIRIN 81 MG PO CHEW
324.0000 mg | CHEWABLE_TABLET | Freq: Every day | ORAL | Status: DC
  Administered 2020-03-21: 324 mg
  Filled 2020-03-20: qty 4

## 2020-03-20 MED ORDER — ORAL CARE MOUTH RINSE
15.0000 mL | OROMUCOSAL | Status: DC
  Administered 2020-03-20 – 2020-03-21 (×11): 15 mL via OROMUCOSAL

## 2020-03-20 MED ORDER — FENTANYL CITRATE (PF) 250 MCG/5ML IJ SOLN
INTRAMUSCULAR | Status: AC
  Filled 2020-03-20: qty 25

## 2020-03-20 MED ORDER — FENTANYL CITRATE (PF) 250 MCG/5ML IJ SOLN
INTRAMUSCULAR | Status: DC | PRN
  Administered 2020-03-20: 150 ug via INTRAVENOUS
  Administered 2020-03-20 (×2): 100 ug via INTRAVENOUS
  Administered 2020-03-20: 25 ug via INTRAVENOUS
  Administered 2020-03-20: 100 ug via INTRAVENOUS
  Administered 2020-03-20: 25 ug via INTRAVENOUS
  Administered 2020-03-20: 200 ug via INTRAVENOUS
  Administered 2020-03-20: 50 ug via INTRAVENOUS
  Administered 2020-03-20: 100 ug via INTRAVENOUS

## 2020-03-20 MED ORDER — POTASSIUM CHLORIDE 10 MEQ/50ML IV SOLN
10.0000 meq | INTRAVENOUS | Status: AC
  Administered 2020-03-20 (×3): 10 meq via INTRAVENOUS

## 2020-03-20 MED ORDER — PANTOPRAZOLE SODIUM 40 MG PO TBEC
40.0000 mg | DELAYED_RELEASE_TABLET | Freq: Every day | ORAL | Status: DC
  Administered 2020-03-22 – 2020-03-24 (×3): 40 mg via ORAL
  Filled 2020-03-20 (×3): qty 1

## 2020-03-20 MED ORDER — BISACODYL 10 MG RE SUPP
10.0000 mg | Freq: Every day | RECTAL | Status: DC
  Administered 2020-03-21: 10 mg via RECTAL
  Filled 2020-03-20: qty 1

## 2020-03-20 MED ORDER — BISACODYL 5 MG PO TBEC
10.0000 mg | DELAYED_RELEASE_TABLET | Freq: Every day | ORAL | Status: DC
  Administered 2020-03-22 – 2020-03-26 (×4): 10 mg via ORAL
  Filled 2020-03-20 (×4): qty 2

## 2020-03-20 MED ORDER — ALBUMIN HUMAN 5 % IV SOLN
250.0000 mL | INTRAVENOUS | Status: AC | PRN
  Administered 2020-03-20 (×3): 12.5 g via INTRAVENOUS
  Filled 2020-03-20: qty 250

## 2020-03-20 MED ORDER — SODIUM CHLORIDE 0.45 % IV SOLN: INTRAVENOUS | Status: DC | PRN

## 2020-03-20 MED ORDER — VANCOMYCIN HCL 750 MG/150ML IV SOLN
750.0000 mg | Freq: Two times a day (BID) | INTRAVENOUS | Status: AC
  Administered 2020-03-20 – 2020-03-22 (×4): 750 mg via INTRAVENOUS
  Filled 2020-03-20 (×4): qty 150

## 2020-03-20 MED ORDER — MIDAZOLAM HCL 2 MG/2ML IJ SOLN
2.0000 mg | INTRAMUSCULAR | Status: DC | PRN
  Administered 2020-03-20 – 2020-03-21 (×3): 2 mg via INTRAVENOUS
  Filled 2020-03-20 (×3): qty 2

## 2020-03-20 MED ORDER — SODIUM CHLORIDE 0.9 % IV SOLN
20.0000 ug | Freq: Once | INTRAVENOUS | Status: AC
  Administered 2020-03-20: 20 ug via INTRAVENOUS
  Filled 2020-03-20: qty 5

## 2020-03-20 MED ORDER — FLUCONAZOLE IN SODIUM CHLORIDE 400-0.9 MG/200ML-% IV SOLN
400.0000 mg | Freq: Once | INTRAVENOUS | Status: AC
  Administered 2020-03-21: 05:00:00 400 mg via INTRAVENOUS
  Filled 2020-03-20: qty 200

## 2020-03-20 MED ORDER — VANCOMYCIN HCL IN DEXTROSE 1-5 GM/200ML-% IV SOLN: 1000.0000 mg | Freq: Two times a day (BID) | INTRAVENOUS | Status: DC

## 2020-03-20 MED ORDER — ALBUMIN HUMAN 5 % IV SOLN: INTRAVENOUS | Status: DC | PRN

## 2020-03-20 MED ORDER — VANCOMYCIN HCL IN DEXTROSE 1-5 GM/200ML-% IV SOLN
INTRAVENOUS | Status: AC
  Filled 2020-03-20: qty 200

## 2020-03-20 MED ORDER — SODIUM CHLORIDE 0.9% FLUSH
10.0000 mL | INTRAVENOUS | Status: DC | PRN
  Administered 2020-04-14: 10 mL

## 2020-03-20 MED ORDER — ETOMIDATE 2 MG/ML IV SOLN
INTRAVENOUS | Status: AC
  Filled 2020-03-20: qty 10

## 2020-03-20 MED ORDER — PHENYLEPHRINE 40 MCG/ML (10ML) SYRINGE FOR IV PUSH (FOR BLOOD PRESSURE SUPPORT)
PREFILLED_SYRINGE | INTRAVENOUS | Status: AC
  Filled 2020-03-20: qty 20

## 2020-03-20 SURGICAL SUPPLY — 125 items
ADAPTER CARDIO PERF ANTE/RETRO (ADAPTER) IMPLANT
ANTEGRADE CPLG (MISCELLANEOUS) IMPLANT
APPLICATOR TIP COSEAL (VASCULAR PRODUCTS) ×4 IMPLANT
ATRICLIP EXCLUSION VLAA 50 (Miscellaneous) ×4 IMPLANT
BAG DECANTER FOR FLEXI CONT (MISCELLANEOUS) ×8 IMPLANT
BATTERY MAXDRIVER (MISCELLANEOUS) ×4 IMPLANT
BLADE CLIPPER SURG (BLADE) IMPLANT
BLADE CORE FAN STRYKER (BLADE) ×4 IMPLANT
BLADE STERNUM SYSTEM 6 (BLADE) IMPLANT
BLADE SURG 12 STRL SS (BLADE) ×4 IMPLANT
BLADE SURG 15 STRL LF DISP TIS (BLADE) IMPLANT
BLADE SURG 15 STRL SS (BLADE)
CANISTER SUCT 3000ML PPV (MISCELLANEOUS) ×4 IMPLANT
CANNULA ARTERIAL NVNT 3/8 20FR (MISCELLANEOUS) ×4 IMPLANT
CANNULA NON VENT 22FR 12 (CANNULA) ×4 IMPLANT
CANNULA SUMP PERICARDIAL (CANNULA) ×4 IMPLANT
CANNULA VENOUS LOW PROF 34X46 (CANNULA) ×4 IMPLANT
CATH FOLEY 2WAY SLVR  5CC 14FR (CATHETERS) ×1
CATH FOLEY 2WAY SLVR 5CC 14FR (CATHETERS) ×3 IMPLANT
CATH HEART VENT LEFT (CATHETERS) IMPLANT
CATH ROBINSON RED A/P 18FR (CATHETERS) ×12 IMPLANT
CATH THORACIC 28FR (CATHETERS) IMPLANT
CHLORAPREP W/TINT 26 (MISCELLANEOUS) IMPLANT
CLAMP ISOLATOR SYNERGY LG (MISCELLANEOUS) ×4 IMPLANT
CLIP VESOCCLUDE SM WIDE 24/CT (CLIP) ×4 IMPLANT
CNTNR URN SCR LID CUP LEK RST (MISCELLANEOUS) ×3 IMPLANT
CONN ST 1/4X3/8  BEN (MISCELLANEOUS) ×4
CONN ST 1/4X3/8 BEN (MISCELLANEOUS) ×12 IMPLANT
CONT SPEC 4OZ STRL OR WHT (MISCELLANEOUS) ×1
DERMABOND ADVANCED (GAUZE/BANDAGES/DRESSINGS) ×1
DERMABOND ADVANCED .7 DNX12 (GAUZE/BANDAGES/DRESSINGS) ×3 IMPLANT
DEVICE EXCLUSIN ATRCLP VLAA 50 (Miscellaneous) ×3 IMPLANT
DRAIN CHANNEL 28F RND 3/8 FF (WOUND CARE) ×8 IMPLANT
DRAPE CV SPLIT W-CLR ANES SCRN (DRAPES) ×4 IMPLANT
DRAPE INCISE IOBAN 66X45 STRL (DRAPES) ×8 IMPLANT
DRAPE PERI GROIN 82X75IN TIB (DRAPES) ×4 IMPLANT
DRAPE SLUSH/WARMER DISC (DRAPES) ×4 IMPLANT
DRSG AQUACEL AG ADV 3.5X14 (GAUZE/BANDAGES/DRESSINGS) ×4 IMPLANT
ELECT BLADE 4.0 EZ CLEAN MEGAD (MISCELLANEOUS) ×4
ELECT BLADE 6.5 EXT (BLADE) ×4 IMPLANT
ELECT CAUTERY BLADE 6.4 (BLADE) ×4 IMPLANT
ELECT REM PT RETURN 9FT ADLT (ELECTROSURGICAL) ×4
ELECTRODE BLDE 4.0 EZ CLN MEGD (MISCELLANEOUS) ×3 IMPLANT
ELECTRODE REM PT RTRN 9FT ADLT (ELECTROSURGICAL) ×3 IMPLANT
FELT TEFLON 6X6 (MISCELLANEOUS) ×4 IMPLANT
FIBERTAPE STERNAL CLSR 2 36IN (SUTURE) ×8 IMPLANT
FIBERTAPE STERNAL CLSR 2X36 (SUTURE) ×4 IMPLANT
GAUZE SPONGE 4X4 12PLY STRL (GAUZE/BANDAGES/DRESSINGS) ×4 IMPLANT
GLOVE BIO SURGEON STRL SZ 6.5 (GLOVE) ×20 IMPLANT
GLOVE BIO SURGEON STRL SZ7.5 (GLOVE) ×8 IMPLANT
GLOVE BIOGEL PI IND STRL 6.5 (GLOVE) ×3 IMPLANT
GLOVE BIOGEL PI IND STRL 8 (GLOVE) ×3 IMPLANT
GLOVE BIOGEL PI INDICATOR 6.5 (GLOVE) ×1
GLOVE BIOGEL PI INDICATOR 8 (GLOVE) ×1
GLOVE NEODERM STRL 7.5 LF PF (GLOVE) ×9 IMPLANT
GLOVE SURG NEODERM 7.5  LF PF (GLOVE) ×3
GOWN STRL REUS W/ TWL LRG LVL3 (GOWN DISPOSABLE) ×24 IMPLANT
GOWN STRL REUS W/TWL LRG LVL3 (GOWN DISPOSABLE) ×8
HEMOSTAT POWDER SURGIFOAM 1G (HEMOSTASIS) ×16 IMPLANT
HEMOSTAT SURGICEL 2X14 (HEMOSTASIS) IMPLANT
INSERT FOGARTY XLG (MISCELLANEOUS) ×4 IMPLANT
KIT BASIN OR (CUSTOM PROCEDURE TRAY) ×4 IMPLANT
KIT LVAD HEARTMATE 3 W-CNTRL (Prosthesis & Implant Heart) ×4 IMPLANT
KIT SUCTION CATH 14FR (SUCTIONS) ×4 IMPLANT
KIT SUT CK MINI COMBO 4X17 (Prosthesis & Implant Heart) ×4 IMPLANT
KIT TURNOVER KIT B (KITS) ×4 IMPLANT
LEAD PACING MYOCARDI (MISCELLANEOUS) IMPLANT
LINE VENT (MISCELLANEOUS) ×4 IMPLANT
MICROMATRIX 1000MG (Tissue) ×4 IMPLANT
NEEDLE PERC 18GX7CM (NEEDLE) ×4 IMPLANT
NS IRRIG 1000ML POUR BTL (IV SOLUTION) ×24 IMPLANT
PACK OPEN HEART (CUSTOM PROCEDURE TRAY) ×4 IMPLANT
PAD ARMBOARD 7.5X6 YLW CONV (MISCELLANEOUS) ×8 IMPLANT
PAD DEFIB R2 (MISCELLANEOUS) ×4 IMPLANT
PLATE STERNAL 2.3X208 14H 2-PK (Plate) ×4 IMPLANT
POSITIONER HEAD DONUT 9IN (MISCELLANEOUS) ×4 IMPLANT
POWDER SURGICEL 3.0 GRAM (HEMOSTASIS) ×4 IMPLANT
PUNCH AORTIC ROTATE 4.5MM 8IN (MISCELLANEOUS) ×4 IMPLANT
PUTTY DBM STAGRAFT PLUS 10CC (Putty) ×4 IMPLANT
PUTTY DBX 1CC (Putty) ×4 IMPLANT
PUTTY DBX 1CC DEPUY (Putty) ×3 IMPLANT
SCREW LOCKING TI 2.3X13MM (Screw) ×12 IMPLANT
SCREW STERNAL 2.3X17MM (Screw) ×20 IMPLANT
SCREW STERNAL LOCK 2.3MM (Screw) ×16 IMPLANT
SEALANT SURG COSEAL 8ML (VASCULAR PRODUCTS) ×4 IMPLANT
SET CARDIOPLEGIA MPS 5001102 (MISCELLANEOUS) ×4 IMPLANT
SHEATH AVANTI 11CM 5FR (SHEATH) IMPLANT
SHEATH PINNACLE 8F 10CM (SHEATH) ×8 IMPLANT
SOLUTION PARTIC MCRMTRX 1000MG (Tissue) ×3 IMPLANT
SPONGE LAP 4X18 RFD (DISPOSABLE) ×4 IMPLANT
SUT ETHIBOND 2 0 SH (SUTURE) ×5
SUT ETHIBOND 2 0 SH 36X2 (SUTURE) ×15 IMPLANT
SUT ETHIBOND NAB MH 2-0 36IN (SUTURE) ×56 IMPLANT
SUT ETHILON 3 0 FSL (SUTURE) ×4 IMPLANT
SUT MNCRL AB 4-0 PS2 18 (SUTURE) ×4 IMPLANT
SUT PROLENE 1 CTX 30  8455H (SUTURE) ×1
SUT PROLENE 1 CTX 30 8455H (SUTURE) ×3 IMPLANT
SUT PROLENE 3 0 RB 1 (SUTURE) IMPLANT
SUT PROLENE 3 0 SH DA (SUTURE) ×8 IMPLANT
SUT PROLENE 4 0 RB 1 (SUTURE) ×7
SUT PROLENE 4-0 RB1 .5 CRCL 36 (SUTURE) ×21 IMPLANT
SUT PROLENE 5 0 C1 (SUTURE) IMPLANT
SUT SILK  1 MH (SUTURE) ×4
SUT SILK 1 MH (SUTURE) ×12 IMPLANT
SUT SILK 1 TIES 10X30 (SUTURE) ×4 IMPLANT
SUT SILK 2 0 SH CR/8 (SUTURE) ×8 IMPLANT
SUT STEEL 6MS V (SUTURE) ×8 IMPLANT
SUT STEEL SZ 6 DBL 3X14 BALL (SUTURE) IMPLANT
SUT TEM PAC WIRE 2 0 SH (SUTURE) IMPLANT
SUT VIC AB 1 CTX 36 (SUTURE) ×2
SUT VIC AB 1 CTX36XBRD ANBCTR (SUTURE) ×6 IMPLANT
SUT VIC AB 2-0 CTX 27 (SUTURE) ×8 IMPLANT
SUT VIC AB 3-0 SH 8-18 (SUTURE) ×4 IMPLANT
SUT VIC AB 3-0 X1 27 (SUTURE) ×8 IMPLANT
SYR 50ML LL SCALE MARK (SYRINGE) ×4 IMPLANT
SYSTEM SAHARA CHEST DRAIN ATS (WOUND CARE) ×4 IMPLANT
TAPE CLOTH SURG 4X10 WHT LF (GAUZE/BANDAGES/DRESSINGS) ×4 IMPLANT
TOWEL GREEN STERILE (TOWEL DISPOSABLE) ×4 IMPLANT
TRAY CATH LUMEN 1 20CM STRL (SET/KITS/TRAYS/PACK) ×4 IMPLANT
TRAY FOLEY SLVR 16FR TEMP STAT (SET/KITS/TRAYS/PACK) ×4 IMPLANT
TUBE CONNECTING 12X1/4 (SUCTIONS) ×4 IMPLANT
UNDERPAD 30X30 (UNDERPADS AND DIAPERS) ×4 IMPLANT
VENT LEFT HEART 12002 (CATHETERS)
WATER STERILE IRR 1000ML POUR (IV SOLUTION) ×8 IMPLANT
YANKAUER SUCT BULB TIP NO VENT (SUCTIONS) ×4 IMPLANT

## 2020-03-20 NOTE — Progress Notes (Addendum)
Advanced Heart Failure Rounding Note   Subjective:    Returned to CCU from OR.  S/p HM3 VAD 4/8 w MAZE procedure + LAA clipping.  Intubated. CVP 7 out of OR. Given albumin. CVP up to 11.   VAD parameters stable.   VAD Interrogation  Flow 4.6, Speed 5200, Power 3.7, Pulse Index 3.1. No PI events   Objective:   Weight Range:  Vital Signs:   Temp:  [97 F (36.1 C)-98.6 F (37 C)] 97 F (36.1 C) (04/08 1700) Pulse Rate:  [25-121] 108 (04/08 1700) Resp:  [12-24] 21 (04/08 1700) BP: (67-169)/(38-106) 79/70 (04/08 1700) SpO2:  [93 %-100 %] 97 % (04/08 1700) FiO2 (%):  [50 %] 50 % (04/08 1435) Weight:  [103 kg] 103 kg (04/08 0205) Last BM Date: 04/10/2020  Weight change: Filed Weights   03/24/2020 0459 03/31/2020 0400 03/28/2020 0205  Weight: 110.1 kg 108.1 kg 103 kg    Intake/Output:   Intake/Output Summary (Last 24 hours) at 03/14/2020 1724 Last data filed at 03/15/2020 1700 Gross per 24 hour  Intake 5951.83 ml  Output 5323 ml  Net 628.83 ml     Physical Exam: CVP 11 General: intubated and sedated   HEENT: + left mandibular ecchymosis (s/p recent dental extraction) + ETT Neck: supple. + RIJ swan. Carotids 2+ bilat; no bruits. No lymphadenopathy or thryomegaly appreciated. Cor: +LAD HUM Drive-line site: stable  Lungs: intubated and clear Abdomen: soft, nontender, nondistended. No hepatosplenomegaly. No bruits or masses. Good bowel sounds. Extremities: no cyanosis, clubbing, rash, 1+ edema Neuro: alert & orientedx3, cranial nerves grossly intact. moves all 4 extremities w/o difficulty. Affect pleasant    Telemetry: sinus tach low 100s Personally reviewed   Labs: Basic Metabolic Panel: Recent Labs  Lab 03/31/2020 0352 04/11/2020 0352 03/19/20 0452 03/19/20 1223 03/19/20 1233 03/19/20 2150 03/23/2020 0305 03/29/2020 0812 04/07/2020 1049 04/07/2020 1128 03/19/2020 1138 03/22/2020 1226 03/13/2020 1249 03/27/2020 1253 04/10/2020 1512  NA 138   < > 137   < > 136  --  133*    < > 134*   < > 136 136 138 137 139  K 4.0   < > 3.9   < > 3.9  --  3.7   < > 3.9   < > 3.7 3.5 3.0* 3.0* 3.0*  CL 103   < > 101  --  98  --  94*   < > 93*  --  94* 95*  --  97* 102  CO2 26  --  27  --  27  --  29  --   --   --   --   --   --   --  26  GLUCOSE 250*   < > 209*  --  255*   < > 291*   < > 187*  --  176* 176*  --  188* 180*  BUN 22   < > 15  --  16  --  21   < > 21  --  21 23  --  21 19  CREATININE 1.56*   < > 1.25*  --  1.40*  --  1.45*   < > 1.40*  --  1.30* 1.40*  --  0.70 1.44*  CALCIUM 9.1   < > 9.1  --  9.3  --  9.4  --   --   --   --   --   --   --  8.6*  MG  --   --   --   --   --   --   --   --   --   --   --   --   --   --  1.6*   < > = values in this interval not displayed.    Liver Function Tests: Recent Labs  Lab 03/15/20 0311 04/09/2020 0352 03/19/20 1233  AST 21 16 20   ALT 21 18 17   ALKPHOS 72 64 85  BILITOT 1.0 0.9 1.7*  PROT 5.3* 5.8* 6.6  ALBUMIN 3.2* 3.3* 3.8   No results for input(s): LIPASE, AMYLASE in the last 168 hours. No results for input(s): AMMONIA in the last 168 hours.  CBC: Recent Labs  Lab 03/14/2020 0159 03/23/2020 0159 03/19/2020 0352 03/19/2020 0352 03/19/20 0452 03/19/20 1223 03/13/2020 0305 04/08/2020 0812 03/31/2020 1136 03/29/2020 1136 04/05/2020 1138 04/02/2020 1226 04/06/2020 1249 03/19/2020 1253 03/28/2020 1512  WBC 5.6  --  4.5  --  6.6  --  8.0  --   --   --   --   --   --   --  10.1  HGB 11.5*   < > 10.9*   < > 11.4*   < > 13.0   < > 10.5*   < > 10.5* 8.8* 10.2* 10.5* 9.4*  HCT 34.6*   < > 33.9*   < > 34.2*   < > 38.3*   < > 31.1*   < > 31.0* 26.0* 30.0* 31.0* 28.7*  MCV 92.0  --  92.6  --  92.7  --  89.3  --   --   --   --   --   --   --  92.3  PLT 160   < > 138*  --  141*  --  173  --  171  --   --   --   --   --  108*  108*   < > = values in this interval not displayed.    Cardiac Enzymes: No results for input(s): CKTOTAL, CKMB, CKMBINDEX, TROPONINI in the last 168 hours.  BNP: BNP (last 3 results) Recent Labs     01/04/20 1642  BNP 165.2*    ProBNP (last 3 results) No results for input(s): PROBNP in the last 8760 hours.    Other results:  Imaging: DG CHEST PORT 1 VIEW  Result Date: 04/09/2020 CLINICAL DATA:  Left ventricular assist device placement EXAM: PORTABLE CHEST 1 VIEW COMPARISON:  03/15/2020 FINDINGS: Endotracheal tube tip 5 cm above the carina. Orogastric or nasogastric tube enters the abdomen. Swan-Ganz catheter inserted from a right internal jugular approach has its tip in the main pulmonary artery. Pacemaker/AICD remains in place. Left ventricular assist device now present. No sign of pneumothorax or pleural fluid. Atrial clip in place. The lungs are clear except for a volume loss in the left lower lobe. No pulmonary edema. IMPRESSION: Left ventricular assist device placement. No evidence of procedural complication. Left lower lobe volume loss. Lines and tubes well position radiographically. Electronically Signed   By: Nelson Chimes M.D.   On: 03/27/2020 15:05     Medications:     Scheduled Medications: . sodium chloride   Intravenous Once  . [START ON 03/21/2020] acetaminophen  1,000 mg Oral Q6H   Or  . [START ON 03/21/2020] acetaminophen (TYLENOL) oral liquid 160 mg/5 mL  1,000 mg Per Tube Q6H  . [START ON 03/21/2020] aspirin EC  325 mg Oral Daily   Or  . [START ON 03/21/2020] aspirin  324 mg Per Tube Daily   Or  . [START ON 03/21/2020] aspirin  300 mg Rectal Daily  . atorvastatin  80 mg Oral q1800  . [START ON 03/21/2020] bisacodyl  10 mg Oral Daily   Or  . [START ON 03/21/2020] bisacodyl  10 mg Rectal Daily  . chlorhexidine gluconate (MEDLINE KIT)  15 mL Mouth Rinse BID  . Chlorhexidine Gluconate Cloth  6 each Topical Daily  . citalopram  20 mg Oral Daily  . [START ON 03/21/2020] docusate sodium  200 mg Oral Daily  . feeding supplement (ENSURE ENLIVE)  237 mL Oral TID BM  . influenza vaccine adjuvanted  0.5 mL Intramuscular Tomorrow-1000  . mouth rinse  15 mL Mouth Rinse 10 times per  day  . [START ON 03/22/2020] pantoprazole  40 mg Oral Daily  . sodium chloride flush  10-40 mL Intracatheter Q12H  . sodium chloride flush  10-40 mL Intracatheter Q12H  . [START ON 03/21/2020] sodium chloride flush  3 mL Intravenous Q12H  . tamsulosin  0.4 mg Oral Daily    Infusions: . sodium chloride Stopped (03/30/2020 1531)  . sodium chloride    . [START ON 03/21/2020] sodium chloride    . sodium chloride 10 mL/hr at 03/31/2020 1420  . albumin human 12.5 g (03/26/2020 1420)  . amiodarone 60 mg/hr (04/03/2020 1700)  . amiodarone    . cefUROXime (ZINACEF)  IV    . dexmedetomidine (PRECEDEX) IV infusion 0.7 mcg/kg/hr (03/14/2020 1700)  . epinephrine 2 mcg/min (03/19/2020 1700)  . famotidine (PEPCID) IV 20 mg (03/30/2020 1420)  . [START ON 03/21/2020] fluconazole (DIFLUCAN) IV    . insulin 5 mL/hr at 04/10/2020 1700  . lactated ringers    . lactated ringers    . lactated ringers 75 mL/hr at 03/16/2020 1700  . magnesium sulfate 20 mL/hr at 04/06/2020 1700  . milrinone 0.25 mcg/kg/min (03/16/2020 1700)  . norepinephrine (LEVOPHED) Adult infusion 8 mcg/min (04/03/2020 1700)  . potassium chloride 50 mL/hr at 03/19/2020 1700  . [START ON 03/21/2020] rifampin (RIFADIN) IVPB    . vancomycin      PRN Medications: sodium chloride, acetaminophen, albumin human, ALPRAZolam, dextrose, HYDROcodone-acetaminophen, lactated ringers, midazolam, morphine injection, ondansetron (ZOFRAN) IV, ondansetron (ZOFRAN) IV, oxyCODONE, sodium chloride flush, sodium chloride flush, [START ON 03/21/2020] sodium chloride flush, traMADol   Assessment/Plan:   1. Acute on chronic systolic HF -> cardiogenic shock-> S/p HM3 LVAD - Echo 2016 EF 30-35% - Echo 1/21 EF 25% - Now admitted with NYHA IV symptoms and AKI with attempts at diuresis.  - Echo this admit: EF 10-15% moderate RV dysfunction - R/LHC cath 3/21 with severe 3v CAD and low output with CI 1.7.  - Swan placed. Initial co-ox 39%. Initial PAPi 1.5. Started on milrinone 0.25. -  Initially planned for CABG but given need for re-do sternotomy, relatively poor targets and longstanding low EF, VAD felt to be better option  - S/p teeth extractions by Dr. Enrique Sack. - S/p HM3 VAD 4/8 - On milrinone + NE + Epi post op. Continue and will gradually wean as tolerated - VAD parameters stable  - follow LDH   2. CAD with unstable angina - s/p previous PCI - cath 02/19/2020 with severe 3v CAD - No s/s angina Plan as above - continue ASA/statin  3. AKI on CKD 3a - baseline creatinine 1.3-1.5 -> 2.2 to AKI - Improving w/ hemodynamic support, down to 1.4 today  - Renal u/s 4/5 unremarkable   4. Permanent AF - now s/p MAZE + LAA Clipping 4/8 - continue IV amiodarone - On Eliquis at home. Will be changed to coumadin given LVAD - CT surgery to determine start of a/c  5. MVP  s/p MV repair - now with recurrent severe MR on echo and with huge v-waves on PCWP tracing - s/p VAD  6. DM2, poorly controlled - hgbA1c 11.1% - continue insulin and SSI    Length of Stay: [place] PA-C 04/10/2020, 5:24 PM  Advanced Heart Failure Team Pager [phone] (M-F; 7a - 4p)  Please contact [facility] for night-coverage after hours (4p -7a ) and weekends on [email or web address]  Agree.   Just back from OR with new VAD placement and maze. Remains intubated on multiple pressors. Oxygenation ok. MAPs ok. Back in AF. Rate ok. Minimal CT drainage.  General: intubated sedated HEENT: normal  +ETT + ecchymosis Neck: supple. RIJ swan.  Carotids 2+ bilat; no bruits. No lymphadenopathy or thryomegaly appreciated. Cor: LVAD hum. Sternal dressing and CTs ok  Lungs: coarse Abdomen: obese soft, nontender, + distended. No hepatosplenomegaly. No bruits or masses. Good bowel sounds. Driveline dressing ok  Extremities: no cyanosis, clubbing, rash. Warm 1+ edema  Neuro: intubated sedated  Looks good immediately post op from VAD placement. Flows on VAD stable. PI 3.1-3.3 range. Wean vent as  tolerated. Continues drips. Watch CT output. Start diuresis in am. D/w Dr. Orvan Seen    CRITICAL CARE Performed by: Glori Bickers  Total critical care time: 35 minutes  Critical care time was exclusive of separately billable procedures and treating other patients.  Critical care was necessary to treat or prevent imminent or life-threatening deterioration.  Critical care was time spent personally by me (independent of midlevel providers or residents) on the following activities: development of treatment plan with patient and/or surrogate as well as nursing, discussions with consultants, evaluation of patient's response to treatment, examination of patient, obtaining history from patient or surrogate, ordering and performing treatments and interventions, ordering and review of laboratory studies, ordering and review of radiographic studies, pulse oximetry and re-evaluation of patient's condition.  Glori Bickers, MD  7:21 PM

## 2020-03-20 NOTE — Anesthesia Procedure Notes (Signed)
Arterial Line Insertion Start/End04/15/2021 7:00 AM, 04/03/2020 7:05 AM Performed by: Wilburn Cornelia, CRNA, CRNA  Patient location: Pre-op. Preanesthetic checklist: patient identified, IV checked, site marked, risks and benefits discussed, surgical consent, monitors and equipment checked, pre-op evaluation, timeout performed and anesthesia consent Lidocaine 1% used for infiltration and patient sedated Left, radial was placed Catheter size: 20 G Hand hygiene performed  and maximum sterile barriers used  Allen's test indicative of satisfactory collateral circulation Attempts: 2 Procedure performed without using ultrasound guided technique. Following insertion, Biopatch and dressing applied. Post procedure assessment: normal  Patient tolerated the procedure well with no immediate complications.

## 2020-03-20 NOTE — Progress Notes (Signed)
Patient ID: [name] [name], male   DOB: August 17, 1951, 69 y.o.   MRN: [id number] EVENING ROUNDS NOTE :     [address]       [place] [address]             [phone]                 Day of Surgery Procedure(s) (LRB): REDO STERNOTOMY WITH STERNAL PLATING (N/A) INSERTION OF IMPLANTABLE LEFT VENTRICULAR ASSIST DEVICE - HM3 (N/A) MAZE (N/A) TRANSESOPHAGEAL ECHOCARDIOGRAM (TEE) (N/A) Clipping Of Atrial Appendage (Left)  Total Length of Stay:  LOS: 8 days  BP (!) 78/66   Pulse (!) 31   Temp (!) 97 F (36.1 C)   Resp 15   Ht 6' (1.829 m)   Wt 103 kg   SpO2 95%   BMI 30.80 kg/m   .Intake/Output      04/07 0701 - 04/08 0700 04/08 0701 - 04/09 0700   P.O. 1057    I.V. (mL/kg) 627.9 (6.1) 2567.2 (24.9)   Blood  1213   IV Piggyback  1116.1   Total Intake(mL/kg) 1684.9 (16.4) 4896.2 (47.5)   Urine (mL/kg/hr) 4825 (2) 930 (0.9)   Blood  1200   Chest Tube  296   Total Output 4825 2426   Net -3140.1 +2470.2          . sodium chloride Stopped (04/10/2020 1531)  . sodium chloride    . [START ON 03/21/2020] sodium chloride    . sodium chloride 10 mL/hr at 04/03/2020 1420  . albumin human 12.5 g (03/25/2020 1420)  . amiodarone 60 mg/hr (03/29/2020 1600)  . amiodarone    . cefUROXime (ZINACEF)  IV    . dexmedetomidine (PRECEDEX) IV infusion 0.7 mcg/kg/hr (03/13/2020 1600)  . epinephrine 3 mcg/min (03/29/2020 1600)  . famotidine (PEPCID) IV 20 mg (03/16/2020 1420)  . [START ON 03/21/2020] fluconazole (DIFLUCAN) IV    . insulin 6.5 mL/hr at 04/08/2020 1600  . lactated ringers    . lactated ringers    . lactated ringers 75 mL/hr at 04/07/2020 1420  . magnesium sulfate 16 mL/hr at 04/06/2020 1600  . milrinone 0.3 mcg/kg/min (03/21/2020 1600)  . norepinephrine (LEVOPHED) Adult infusion 8 mcg/min (03/23/2020 1600)  . potassium chloride 10 mEq (03/13/2020 1636)  . [START ON 03/21/2020] rifampin (RIFADIN) IVPB    . vancomycin       Lab Results  Component Value Date   WBC 8.0 04/10/2020    HGB 10.5 (L) 03/19/2020   HCT 31.0 (L) 03/14/2020   PLT PENDING 03/14/2020   GLUCOSE 180 (H) 03/19/2020   CHOL 89 03/19/2020   TRIG 69 03/16/2020   HDL 33 (L) 04/05/2020   LDLCALC 42 04/11/2020   ALT 17 03/19/2020   AST 20 03/19/2020   NA 139 04/10/2020   K 3.0 (L) 03/23/2020   CL 102 03/21/2020   CREATININE 1.44 (H) 03/30/2020   BUN 19 03/14/2020   CO2 26 03/16/2020   TSH 9.134 (H) 03/15/2020   INR 1.5 (H) 04/02/2020   HGBA1C 10.5 (H) 03/25/2020   uop 30 /hr Not bleeding Epi 2 and levaphed 8  Flow 4.8    Grace Isaac MD  Beeper [phone] Office [phone] 03/27/2020 4:50 PM

## 2020-03-20 NOTE — Anesthesia Procedure Notes (Signed)
Central Venous Catheter Insertion Performed by: Roberts Gaudy, MD, anesthesiologist Start/End04/27/2021 7:15 AM, 04/06/2020 7:25 AM Patient location: Pre-op. Preanesthetic checklist: patient identified, IV checked, site marked, risks and benefits discussed, surgical consent, monitors and equipment checked, pre-op evaluation, timeout performed and anesthesia consent Lidocaine 1% used for infiltration and patient sedated Hand hygiene performed  and maximum sterile barriers used  Catheter size: 9 Fr Sheath introducer Procedure performed using ultrasound guided technique. Ultrasound Notes:anatomy identified, needle tip was noted to be adjacent to the nerve/plexus identified, no ultrasound evidence of intravascular and/or intraneural injection and image(s) printed for medical record Attempts: 1 Following insertion, line sutured and dressing applied. Post procedure assessment: blood return through all ports, free fluid flow and no air  Patient tolerated the procedure well with no immediate complications.

## 2020-03-20 NOTE — Progress Notes (Signed)
Pharmacy Antibiotic Note  [name] [name] is a 69 y.o. male admitted on 02/23/2020 with surgical prophylaxis post-op LVAD.  Pharmacy has been consulted for vancomycin dosing for 48 hours post-AET (AET:  03/27/2020 at 1432). Cefuroxime also ordered x 4 doses post-op. AKI on admit resolving - SCr trended down to 0.7 today with good UOP.  Patient received pre-op vancomycin 1500mg  IV dose x 1 earlier today at 0821.  Plan: Continue vancomycin 750mg  IV q12h x 48 hours post-AET Cefuroxime 1.5g IV q12h x 4 doses per MD Monitor renal function, vancomycin levels as indicated  Height: 6' (182.9 cm) Weight: 103 kg (227 lb 1.2 oz) IBW/kg (Calculated) : 77.6  Temp (24hrs), Avg:98.3 F (36.8 C), Min:98.1 F (36.7 C), Max:98.6 F (37 C)  Recent Labs  Lab 03/15/20 0311 03/16/20 0433 03/19/2020 0159 04/07/2020 0159 04/11/2020 0352 04/06/2020 0352 03/19/20 0452 03/19/20 1233 03/19/2020 0305 03/17/2020 0817 03/24/2020 1015 04/04/2020 1049 03/30/2020 1138 03/13/2020 1226 03/16/2020 1253  WBC 4.2  --  5.6  --  4.5  --  6.6  --  8.0  --   --   --   --   --   --   CREATININE 2.22*   < > 2.10*   < > 1.56*   < > 1.25*   < > 1.45*   < > 1.20 1.40* 1.30* 1.40* 0.70   < > = values in this interval not displayed.    Estimated Creatinine Clearance: 106.7 mL/min (by C-G formula based on SCr of 0.7 mg/dL).    Allergies  Allergen Reactions  . Liraglutide Nausea And Vomiting  . Lisinopril Cough    Arturo Morton, PharmD, BCPS Please check AMION for all [place] contact numbers Clinical Pharmacist 03/14/2020 3:26 PM

## 2020-03-20 NOTE — Op Note (Signed)
CARDIOTHORACIC SURGERY OPERATIVE NOTE  Date of Procedure: 04/04/2020  Preoperative Diagnosis: Ischemic cardiomyopathy, h/o mitral valve repair via median sternotomy; severely depressed LV systolic function and NYHA 4 sx; atrial fibrillation  Postoperative Diagnosis: Same  Procedure:    Redo sternotomy  Implantation of HeartMate 3 durable LVAD  Left atrial appendage clipping  Rigid sternal reconstruction with linear plating system (KLS)    Surgeon: Jayme Cloud, MD  Assistant: Prescott Gum, Collier Salina MD  Anesthesia: get  Operative Findings:  Severely reduced left ventricular systolic function  Moderately reduced RV function  Well-seated HM3 inflow cannula   BRIEF CLINICAL NOTE AND INDICATIONS FOR SURGERY  69 year old man presented with decompensated heart failure approximately 10 days ago.  He underwent left heart catheterization demonstrating severe multivessel disease and right heart catheterization demonstrated severely impacted hemodynamics.  In addition he had endorgan dysfunction with rising serum creatinine.  The patient was admitted to the ICU for hemodynamic monitoring and inotropic support and responded well.  When considering all his treatment options revascularization was considered low yield and therefore he is consented for durable LVAD.  He presents to the operating room today for this procedure   DETAILS OF THE OPERATIVE PROCEDURE  Preparation:  The patient is brought to the operating room on the above mentioned date and central monitoring was established by the anesthesia team including placement of Swan-Ganz catheter and radial arterial line. The patient is placed in the supine position on the operating table.  Intravenous antibiotics are administered. General endotracheal anesthesia is induced uneventfully. A Foley catheter is placed.  Baseline transesophageal echocardiogram was performed.  Findings were notable for lack of patent foramen ovale and no  significant tricuspid or aortic valve disease  The patient's chest, abdomen, both groins, and both lower extremities are prepared and draped in a sterile manner. A time out procedure is performed.   Surgical Approach and Conduit Harvest:  Redo median sternotomy incision was performed and the chest is entered safely without injury to underlying structures. A pump pocket is created towards the LV apex by dividing muscular diaphragmatic attachments laterally. Driveline tunnelers were passed from inside the mediastinum inferiorly and then to the left prior to heparinization. Intra-pericardial dissection is performed; pericardial tacking sutures were placed to create a pericardial well. CO2 was blown into the field. Full dose heparin was delivered intravenously. The ascending aorta and the right atrium are cannulated for cardiopulmonary bypass.  Adequate heparinization is verified. Cardiopulmonary bypass was begun and the patient is kept warm; the lungs are continued to inflate at very low tidal volumes to introduce inhaled nitric oxide systemically. Further intrapericardial dissection is performed to for adhesiolysis. The right sided pulmonary veins are encircled and treated with RFA energy source to perform modified maze procedure. The Ligament of Ruthann Cancer is divided, but the left sided veins cannot be safely encircled due to the engorgement of structures on this side. A left atrial appendage clip is applied to the base of the appendage.    LVAD Insertion: Moist packs were placed behind the LV to elevate the LV apex into the wound. The LV apical core is created sharply and angled directly towards the mitral apparatus. The edges of the incised LV are inspected for hemostasis. Circumferential sutures of 2-0 Ethibond with pledgets are placed from the epicardium, into the LV, and back into the epicardium. These are then placed through the sewing cuff of the LVAD inflow housing. The sutures are tied down, and the  pump is brought onto the field and  inspected. The inflow cannula is secured within the housing and the outflow graft is clamped. The driveline is tunneled exteriorly using the pre-placed tunnelers.  The outflow graft is then measured to appropriate length. A partial occlusion clamp is placed on the ascending aorta and aortotomy is made anteriorly. The outflow graft is anastomosed to the ascending aorta with running 4-0 prolene. The side-biting clamp is released and the pump is turned on at 4,000 rpm to deair the LVAD. Full pulmonary ventilation is resumed. The patient is weaned from CPB as the LVAD flows are increased to 5200 rpm gradually.    Procedure Completion:   Epicardial pacing wires are fixed to the diaphragmatic surface of the right ventricle. The patient is rewarmed to 37C temperature. The patient is weaned and disconnected from cardiopulmonary bypass.  The patient's rhythm at separation from bypass was afib.  The patient was weaned from cardiopulmonary bypass with moderate inotropic support.  Followup transesophageal echocardiogram performed after separation from bypass revealed no significant changes from the preoperative exam and good pump inflow positioning.   The aortic and venous cannula were removed uneventfully. Protamine was administered to reverse the anticoagulation. The mediastinum and pleural space were inspected for hemostasis and irrigated with saline solution. The mediastinum and bilateral pleural space were drained using fluted chest tubes placed through separate stab incisions inferiorly.  The soft tissues anterior to the aorta were reapproximated loosely. The sternum is closed by a rigid sternal reconstruction technique using reinforcing linear plates on each side of the sternum.  The soft tissues anterior to the sternum were closed in multiple layers and the skin is closed with a running subcuticular skin closure.       Disposition:   The patient tolerated the procedure  well and is transported to the surgical intensive care in stable condition. There are no intraoperative complications. All sponge instrument and needle counts are verified correct at completion of the operation.

## 2020-03-20 NOTE — Progress Notes (Signed)
CBG >400. On-call cardiologist called. Instructed to admin 5u novolog per SSI, to redraw at midnight, and admin again per SSI if applicable. Pt received 5u novolog around 2200 and 4u at 0045. RN will continue to monitor.

## 2020-03-20 NOTE — Plan of Care (Signed)
  Problem: Cardiovascular: Goal: Ability to achieve and maintain adequate cardiovascular perfusion will improve Outcome: Progressing Goal: Vascular access site(s) Level 0-1 will be maintained Outcome: Progressing   Problem: Health Behavior/Discharge Planning: Goal: Ability to safely manage health-related needs after discharge will improve Outcome: Progressing   Problem: Education: Goal: Understanding of CV disease, CV risk reduction, and recovery process will improve Outcome: Progressing   Problem: Activity: Goal: Ability to return to baseline activity level will improve Outcome: Progressing   Problem: Cardiovascular: Goal: Ability to achieve and maintain adequate cardiovascular perfusion will improve Outcome: Progressing Goal: Vascular access site(s) Level 0-1 will be maintained Outcome: Progressing   Problem: Education: Goal: Knowledge of General Education information will improve Description: Including pain rating scale, medication(s)/side effects and non-pharmacologic comfort measures Outcome: Progressing   Problem: Health Behavior/Discharge Planning: Goal: Ability to manage health-related needs will improve Outcome: Progressing   Problem: Clinical Measurements: Goal: Ability to maintain clinical measurements within normal limits will improve Outcome: Progressing Goal: Will remain free from infection Outcome: Progressing Goal: Diagnostic test results will improve Outcome: Progressing Goal: Respiratory complications will improve Outcome: Progressing Goal: Cardiovascular complication will be avoided Outcome: Progressing   Problem: Activity: Goal: Risk for activity intolerance will decrease Outcome: Progressing   Problem: Nutrition: Goal: Adequate nutrition will be maintained Outcome: Progressing   Problem: Coping: Goal: Level of anxiety will decrease Outcome: Progressing   Problem: Elimination: Goal: Will not experience complications related to bowel  motility Outcome: Progressing Goal: Will not experience complications related to urinary retention Outcome: Progressing   Problem: Pain Managment: Goal: General experience of comfort will improve Outcome: Progressing   Problem: Safety: Goal: Ability to remain free from injury will improve Outcome: Progressing   Problem: Skin Integrity: Goal: Risk for impaired skin integrity will decrease Outcome: Progressing   Problem: Education: Goal: Knowledge of the prescribed therapeutic regimen will improve Outcome: Progressing   Problem: Activity: Goal: Risk for activity intolerance will decrease Outcome: Progressing   Problem: Cardiac: Goal: Ability to maintain an adequate cardiac output will improve Outcome: Progressing   Problem: Coping: Goal: Level of anxiety will decrease Outcome: Progressing   Problem: Fluid Volume: Goal: Risk for excess fluid volume will decrease Outcome: Progressing   Problem: Clinical Measurements: Goal: Ability to maintain clinical measurements within normal limits will improve Outcome: Progressing Goal: Will remain free from infection Outcome: Progressing   Problem: Respiratory: Goal: Will regain and/or maintain adequate ventilation Outcome: Progressing

## 2020-03-20 NOTE — Progress Notes (Signed)
Echocardiogram Echocardiogram Transesophageal has been performed.  Oneal Deputy Adaleah Forget 04/08/2020, 8:40 AM

## 2020-03-20 NOTE — Transfer of Care (Signed)
Immediate Anesthesia Transfer of Care Note  Patient: [name] [name]  Procedure(s) Performed: REDO STERNOTOMY WITH STERNAL PLATING (N/A ) INSERTION OF IMPLANTABLE LEFT VENTRICULAR ASSIST DEVICE - HM3 (N/A Chest) MAZE (N/A ) TRANSESOPHAGEAL ECHOCARDIOGRAM (TEE) (N/A ) Clipping Of Atrial Appendage (Left )  Patient Location: SICU  Anesthesia Type:General  Level of Consciousness: Patient remains intubated per anesthesia plan  Airway & Oxygen Therapy: Patient remains intubated per anesthesia plan and Patient placed on Ventilator (see vital sign flow sheet for setting)  Post-op Assessment: Report given to RN and Post -op Vital signs reviewed and stable  Post vital signs: Reviewed and stable  Last Vitals:  Vitals Value Taken Time  BP    Temp    Pulse 92 03/31/2020 1430  Resp 14 04/05/2020 1430  SpO2 96 % 04/08/2020 1430  Vitals shown include unvalidated device data.  Last Pain:  Vitals:   03/25/2020 0400  TempSrc: Core  PainSc: Asleep         Complications: No apparent anesthesia complications

## 2020-03-20 NOTE — Anesthesia Preprocedure Evaluation (Signed)
Anesthesia Evaluation  Patient identified by MRN, date of birth, ID band Patient awake    Reviewed: Allergy & Precautions, NPO status , Patient's Chart, lab work & pertinent test results  Airway Mallampati: II  TM Distance: >3 FB Neck ROM: Full    Dental  (+) Edentulous Upper, Edentulous Lower   Pulmonary    breath sounds clear to auscultation       Cardiovascular hypertension,  Rhythm:Regular Rate:Normal     Neuro/Psych    GI/Hepatic   Endo/Other  diabetes  Renal/GU      Musculoskeletal   Abdominal   Peds  Hematology   Anesthesia Other Findings   Reproductive/Obstetrics                             Anesthesia Physical Anesthesia Plan  ASA: III  Anesthesia Plan: General   Post-op Pain Management:    Induction: Intravenous  PONV Risk Score and Plan: Ondansetron  Airway Management Planned: Oral ETT  Additional Equipment: Arterial line, PA Cath, 3D TEE and Ultrasound Guidance Line Placement  Intra-op Plan:   Post-operative Plan: Post-operative intubation/ventilation  Informed Consent: I have reviewed the patients History and Physical, chart, labs and discussed the procedure including the risks, benefits and alternatives for the proposed anesthesia with the patient or authorized representative who has indicated his/her understanding and acceptance.       Plan Discussed with: CRNA and Anesthesiologist  Anesthesia Plan Comments:         Anesthesia Quick Evaluation

## 2020-03-20 NOTE — Anesthesia Procedure Notes (Signed)
Procedure Name: Intubation Date/Time: 04/06/2020 8:03 AM Performed by: Wilburn Cornelia, CRNA Pre-anesthesia Checklist: Patient identified, Emergency Drugs available, Suction available, Patient being monitored and Timeout performed Patient Re-evaluated:Patient Re-evaluated prior to induction Oxygen Delivery Method: Circle system utilized Preoxygenation: Pre-oxygenation with 100% oxygen Induction Type: IV induction Ventilation: Mask ventilation without difficulty Laryngoscope Size: Mac and 4 Grade View: Grade II Tube type: Oral Tube size: 8.0 mm Number of attempts: 1 Airway Equipment and Method: Stylet Placement Confirmation: ETT inserted through vocal cords under direct vision,  positive ETCO2,  CO2 detector and breath sounds checked- equal and bilateral Secured at: 24 cm Tube secured with: Tape Dental Injury: Teeth and Oropharynx as per pre-operative assessment

## 2020-03-20 NOTE — H&P (Signed)
History and Physical Interval Note:  04/03/2020 7:22 AM  [name] [name]  has presented today for surgery, with the diagnosis of ICM AFIB.  The various methods of treatment have been discussed with the patient and family. After consideration of risks, benefits and other options for treatment, the patient has consented to  Procedure(s): REDO STERNOTOMY (N/A) INSERTION OF IMPLANTABLE LEFT VENTRICULAR ASSIST DEVICE - HM3 (N/A) possible MAZE (N/A) TRANSESOPHAGEAL ECHOCARDIOGRAM (TEE) (N/A) as a surgical intervention.  The patient's history has been reviewed, patient examined, no change in status, stable for surgery.  I have reviewed the patient's chart and labs.  Questions were answered to the patient's satisfaction.     Wonda Olds

## 2020-03-20 NOTE — Progress Notes (Signed)
CSW visited bedside post op today. Patient resting comfortably on the vent and stable. CSW contacted patient's daughter Ander Purpura who was very appreciative of calls from the [place] today with updates. She is pleased that patient is doing well post op and plans to visit bedside later this evening. CSW offered support and will continue to follow throughout implant hospitalization for supportive needs. [name], [place], [place]

## 2020-03-20 NOTE — Anesthesia Postprocedure Evaluation (Signed)
Anesthesia Post Note  Patient: [name] [name]  Procedure(s) Performed: REDO STERNOTOMY WITH STERNAL PLATING (N/A ) INSERTION OF IMPLANTABLE LEFT VENTRICULAR ASSIST DEVICE - HM3 (N/A Chest) MAZE (N/A ) TRANSESOPHAGEAL ECHOCARDIOGRAM (TEE) (N/A ) Clipping Of Atrial Appendage (Left )     Patient location during evaluation: SICU Anesthesia Type: General Level of consciousness: sedated and patient remains intubated per anesthesia plan Vital Signs Assessment: post-procedure vital signs reviewed and stable Respiratory status: patient remains intubated per anesthesia plan and patient on ventilator - see flowsheet for VS Cardiovascular status: stable Anesthetic complications: no    Last Vitals:  Vitals:   04/04/2020 1745 03/24/2020 1800  BP:  (!) 95/45  Pulse: (!) 25 (!) 31  Resp: 16 17  Temp: (!) 36.2 C (!) 36.2 C  SpO2: 98% 98%    Last Pain:  Vitals:   03/30/2020 0400  TempSrc: Core  PainSc: Asleep                 Omunique Pederson COKER

## 2020-03-20 NOTE — Brief Op Note (Signed)
03/24/2020  3:04 PM  PATIENT:  [name] [name]  69 y.o. male  PRE-OPERATIVE DIAGNOSIS:  ICM AFIB  POST-OPERATIVE DIAGNOSIS:  ICM AFIB  PROCEDURE:  Procedure(s): REDO STERNOTOMY WITH STERNAL PLATING (N/A) INSERTION OF IMPLANTABLE LEFT VENTRICULAR ASSIST DEVICE - HM3 (N/A) MAZE (N/A) TRANSESOPHAGEAL ECHOCARDIOGRAM (TEE) (N/A) Clipping Of Atrial Appendage (Left)  SURGEON:  Surgeon(s) and Role:    * Wonda Olds, MD - Primary    * Ivin Poot, MD - Assisting  PHYSICIAN ASSISTANT: n/a ASSISTANTS: staff   ANESTHESIA:   general  EBL: per anes  BLOOD ADMINISTERED:per anes record: 6 units FFP  DRAINS: 4 Chest Tube(s) in the mediastinum and bilateral pleural spaces   LOCAL MEDICATIONS USED:  NONE  SPECIMEN:  Source of Specimen:  LV apical core  DISPOSITION OF SPECIMEN:  PATHOLOGY  COUNTS:  YES  TOURNIQUET:  * No tourniquets in log *  DICTATION: .Note written in EPIC  PLAN OF CARE: Admit to inpatient   PATIENT DISPOSITION:  ICU - intubated and critically ill.   Delay start of Pharmacological VTE agent (>24hrs) due to surgical blood loss or risk of bleeding: yes

## 2020-03-20 NOTE — Plan of Care (Signed)
Pt hemodynamically stable. CO & CI good. Hypertensive at beginning of shift--received verbal orders for 10mg  hydralazine per AHF MD. First CHG bath given and pt clipped from chin to knees. 2nd CHG will be administered closer to 0500. Pt in good spirits about LVAD placement but will need continued, extensive education in regards to caring for and maintenance of device--educated about changing batteries and the necessity of having backup equipment at all times. Does seem to have good support system at home. RN will contiue to monitor and prep for surgery accordingly.   --CVP line working poorly. Blood return noted. Line zeroed and calibrated multiple times. Line eventually replaced--no resolution of inaccurate readings.  Problem: Cardiovascular: Goal: Ability to achieve and maintain adequate cardiovascular perfusion will improve Outcome: Progressing Goal: Vascular access site(s) Level 0-1 will be maintained Outcome: Progressing   Problem: Education: Goal: Understanding of CV disease, CV risk reduction, and recovery process will improve Outcome: Progressing   Problem: Activity: Goal: Ability to return to baseline activity level will improve Outcome: Progressing   Problem: Cardiovascular: Goal: Ability to achieve and maintain adequate cardiovascular perfusion will improve Outcome: Progressing Goal: Vascular access site(s) Level 0-1 will be maintained Outcome: Progressing   Problem: Clinical Measurements: Goal: Ability to maintain clinical measurements within normal limits will improve Outcome: Progressing Goal: Diagnostic test results will improve Outcome: Progressing   Problem: Nutrition: Goal: Adequate nutrition will be maintained Outcome: Progressing   Problem: Coping: Goal: Level of anxiety will decrease Outcome: Progressing   Problem: Elimination: Goal: Will not experience complications related to urinary retention Outcome: Progressing   Problem: Pain Managment: Goal:  General experience of comfort will improve Outcome: Progressing

## 2020-03-20 NOTE — Anesthesia Procedure Notes (Signed)
Central Venous Catheter Insertion Performed by: Roberts Gaudy, MD, anesthesiologist Start/End04/27/2021 7:15 AM, 04/07/2020 7:25 AM Patient location: Pre-op. Preanesthetic checklist: patient identified, IV checked, site marked, risks and benefits discussed, surgical consent, monitors and equipment checked, pre-op evaluation, timeout performed and anesthesia consent Position: supine Hand hygiene performed  and maximum sterile barriers used  PA cath was placed.Swan type:thermodilution Procedure performed without using ultrasound guided technique. Ultrasound Notes:anatomy identified, needle tip was noted to be adjacent to the nerve/plexus identified and no ultrasound evidence of intravascular and/or intraneural injection Attempts: 1 Following insertion, line sutured, dressing applied and Biopatch. Post procedure assessment: blood return through all ports  Patient tolerated the procedure well with no immediate complications.

## 2020-03-21 ENCOUNTER — Telehealth: Payer: Medicare HMO | Admitting: Internal Medicine

## 2020-03-21 ENCOUNTER — Inpatient Hospital Stay (HOSPITAL_COMMUNITY): Payer: Medicare HMO

## 2020-03-21 DIAGNOSIS — R570 Cardiogenic shock: Secondary | ICD-10-CM

## 2020-03-21 DIAGNOSIS — Z95811 Presence of heart assist device: Secondary | ICD-10-CM

## 2020-03-21 HISTORY — DX: Cardiogenic shock: R57.0

## 2020-03-21 HISTORY — DX: Presence of heart assist device: Z95.811

## 2020-03-21 LAB — POCT I-STAT 7, (LYTES, BLD GAS, ICA,H+H)
Acid-base deficit: 1 mmol/L (ref 0.0–2.0)
Acid-base deficit: 1 mmol/L (ref 0.0–2.0)
Acid-base deficit: 1 mmol/L (ref 0.0–2.0)
Acid-base deficit: 1 mmol/L (ref 0.0–2.0)
Bicarbonate: 24.7 mmol/L (ref 20.0–28.0)
Bicarbonate: 23.8 mmol/L (ref 20.0–28.0)
Bicarbonate: 24.4 mmol/L (ref 20.0–28.0)
Bicarbonate: 23.6 mmol/L (ref 20.0–28.0)
Bicarbonate: 22.6 mmol/L (ref 20.0–28.0)
Bicarbonate: 24 mmol/L (ref 20.0–28.0)
Calcium, Ion: 1.23 mmol/L (ref 1.15–1.40)
Calcium, Ion: 1.24 mmol/L (ref 1.15–1.40)
Calcium, Ion: 1.25 mmol/L (ref 1.15–1.40)
Calcium, Ion: 1.22 mmol/L (ref 1.15–1.40)
Calcium, Ion: 1.21 mmol/L (ref 1.15–1.40)
Calcium, Ion: 1.19 mmol/L (ref 1.15–1.40)
HCT: 27 % — ABNORMAL LOW (ref 39.0–52.0)
HCT: 27 % — ABNORMAL LOW (ref 39.0–52.0)
HCT: 24 % — ABNORMAL LOW (ref 39.0–52.0)
HCT: 26 % — ABNORMAL LOW (ref 39.0–52.0)
HCT: 27 % — ABNORMAL LOW (ref 39.0–52.0)
HCT: 25 % — ABNORMAL LOW (ref 39.0–52.0)
Hemoglobin: 9.2 g/dL — ABNORMAL LOW (ref 13.0–17.0)
Hemoglobin: 9.2 g/dL — ABNORMAL LOW (ref 13.0–17.0)
Hemoglobin: 8.2 g/dL — ABNORMAL LOW (ref 13.0–17.0)
Hemoglobin: 8.8 g/dL — ABNORMAL LOW (ref 13.0–17.0)
Hemoglobin: 9.2 g/dL — ABNORMAL LOW (ref 13.0–17.0)
Hemoglobin: 8.5 g/dL — ABNORMAL LOW (ref 13.0–17.0)
O2 Saturation: 99 %
O2 Saturation: 99 %
O2 Saturation: 97 %
O2 Saturation: 98 %
O2 Saturation: 98 %
O2 Saturation: 98 %
Patient temperature: 37.1
Patient temperature: 36.9
Patient temperature: 36.9
Patient temperature: 37.1
Patient temperature: 37.3
Patient temperature: 37.1
Potassium: 4 mmol/L (ref 3.5–5.1)
Potassium: 4.1 mmol/L (ref 3.5–5.1)
Potassium: 4.1 mmol/L (ref 3.5–5.1)
Potassium: 4.1 mmol/L (ref 3.5–5.1)
Potassium: 4 mmol/L (ref 3.5–5.1)
Potassium: 4 mmol/L (ref 3.5–5.1)
Sodium: 137 mmol/L (ref 135–145)
Sodium: 137 mmol/L (ref 135–145)
Sodium: 138 mmol/L (ref 135–145)
Sodium: 137 mmol/L (ref 135–145)
Sodium: 137 mmol/L (ref 135–145)
Sodium: 137 mmol/L (ref 135–145)
TCO2: 26 mmol/L (ref 22–32)
TCO2: 25 mmol/L (ref 22–32)
TCO2: 26 mmol/L (ref 22–32)
TCO2: 25 mmol/L (ref 22–32)
TCO2: 24 mmol/L (ref 22–32)
TCO2: 25 mmol/L (ref 22–32)
pCO2 arterial: 41.1 mmHg (ref 32.0–48.0)
pCO2 arterial: 37.4 mmHg (ref 32.0–48.0)
pCO2 arterial: 40.6 mmHg (ref 32.0–48.0)
pCO2 arterial: 38.8 mmHg (ref 32.0–48.0)
pCO2 arterial: 35 mmHg (ref 32.0–48.0)
pCO2 arterial: 37.4 mmHg (ref 32.0–48.0)
pH, Arterial: 7.386 (ref 7.350–7.450)
pH, Arterial: 7.411 (ref 7.350–7.450)
pH, Arterial: 7.385 (ref 7.350–7.450)
pH, Arterial: 7.393 (ref 7.350–7.450)
pH, Arterial: 7.419 (ref 7.350–7.450)
pH, Arterial: 7.416 (ref 7.350–7.450)
pO2, Arterial: 151 mmHg — ABNORMAL HIGH (ref 83.0–108.0)
pO2, Arterial: 118 mmHg — ABNORMAL HIGH (ref 83.0–108.0)
pO2, Arterial: 93 mmHg (ref 83.0–108.0)
pO2, Arterial: 110 mmHg — ABNORMAL HIGH (ref 83.0–108.0)
pO2, Arterial: 108 mmHg (ref 83.0–108.0)
pO2, Arterial: 108 mmHg (ref 83.0–108.0)

## 2020-03-21 LAB — PREPARE FRESH FROZEN PLASMA: Unit division: 0

## 2020-03-21 LAB — MAGNESIUM
Magnesium: 2.2 mg/dL (ref 1.7–2.4)
Magnesium: 2.1 mg/dL (ref 1.7–2.4)

## 2020-03-21 LAB — CBC
HCT: 27 % — ABNORMAL LOW (ref 39.0–52.0)
Hemoglobin: 8.7 g/dL — ABNORMAL LOW (ref 13.0–17.0)
MCH: 30 pg (ref 26.0–34.0)
MCHC: 32.2 g/dL (ref 30.0–36.0)
MCV: 93.1 fL (ref 80.0–100.0)
Platelets: 102 10*3/uL — ABNORMAL LOW (ref 150–400)
RBC: 2.9 MIL/uL — ABNORMAL LOW (ref 4.22–5.81)
RDW: 14 % (ref 11.5–15.5)
WBC: 14 10*3/uL — ABNORMAL HIGH (ref 4.0–10.5)
nRBC: 0 % (ref 0.0–0.2)

## 2020-03-21 LAB — BPAM FFP
Blood Product Expiration Date: 202104132359
Blood Product Expiration Date: 202104132359
Blood Product Expiration Date: 202104132359
Blood Product Expiration Date: 202104132359
Blood Product Expiration Date: 202104132359
Blood Product Expiration Date: 202104132359
ISSUE DATE / TIME: 202104081047
ISSUE DATE / TIME: 202104081047
ISSUE DATE / TIME: 202104081047
ISSUE DATE / TIME: 202104081047
ISSUE DATE / TIME: 202104081305
ISSUE DATE / TIME: 202104081305
Unit Type and Rh: 600
Unit Type and Rh: 6200
Unit Type and Rh: 6200
Unit Type and Rh: 6200
Unit Type and Rh: 7300
Unit Type and Rh: 7300

## 2020-03-21 LAB — CBC WITH DIFFERENTIAL/PLATELET
Abs Immature Granulocytes: 0.06 10*3/uL (ref 0.00–0.07)
Basophils Absolute: 0 10*3/uL (ref 0.0–0.1)
Basophils Relative: 0 %
Eosinophils Absolute: 0 10*3/uL (ref 0.0–0.5)
Eosinophils Relative: 0 %
HCT: 28.6 % — ABNORMAL LOW (ref 39.0–52.0)
Hemoglobin: 9.5 g/dL — ABNORMAL LOW (ref 13.0–17.0)
Immature Granulocytes: 0 %
Lymphocytes Relative: 2 %
Lymphs Abs: 0.2 10*3/uL — ABNORMAL LOW (ref 0.7–4.0)
MCH: 30.9 pg (ref 26.0–34.0)
MCHC: 33.2 g/dL (ref 30.0–36.0)
MCV: 93.2 fL (ref 80.0–100.0)
Monocytes Absolute: 1.1 10*3/uL — ABNORMAL HIGH (ref 0.1–1.0)
Monocytes Relative: 8 %
Neutro Abs: 13.1 10*3/uL — ABNORMAL HIGH (ref 1.7–7.7)
Neutrophils Relative %: 90 %
Platelets: 109 10*3/uL — ABNORMAL LOW (ref 150–400)
RBC: 3.07 MIL/uL — ABNORMAL LOW (ref 4.22–5.81)
RDW: 13.7 % (ref 11.5–15.5)
WBC: 14.5 10*3/uL — ABNORMAL HIGH (ref 4.0–10.5)
nRBC: 0 % (ref 0.0–0.2)

## 2020-03-21 LAB — BASIC METABOLIC PANEL
Anion gap: 12 (ref 5–15)
BUN: 25 mg/dL — ABNORMAL HIGH (ref 8–23)
CO2: 22 mmol/L (ref 22–32)
Calcium: 8.6 mg/dL — ABNORMAL LOW (ref 8.9–10.3)
Chloride: 104 mmol/L (ref 98–111)
Creatinine, Ser: 1.95 mg/dL — ABNORMAL HIGH (ref 0.61–1.24)
GFR calc Af Amer: 39 mL/min — ABNORMAL LOW (ref >60)
GFR calc non Af Amer: 34 mL/min — ABNORMAL LOW (ref >60)
Glucose, Bld: 155 mg/dL — ABNORMAL HIGH (ref 70–99)
Potassium: 4.1 mmol/L (ref 3.5–5.1)
Sodium: 138 mmol/L (ref 135–145)

## 2020-03-21 LAB — GLUCOSE, CAPILLARY
Glucose-Capillary: 170 mg/dL — ABNORMAL HIGH (ref 70–99)
Glucose-Capillary: 178 mg/dL — ABNORMAL HIGH (ref 70–99)
Glucose-Capillary: 176 mg/dL — ABNORMAL HIGH (ref 70–99)
Glucose-Capillary: 154 mg/dL — ABNORMAL HIGH (ref 70–99)
Glucose-Capillary: 157 mg/dL — ABNORMAL HIGH (ref 70–99)
Glucose-Capillary: 141 mg/dL — ABNORMAL HIGH (ref 70–99)
Glucose-Capillary: 129 mg/dL — ABNORMAL HIGH (ref 70–99)
Glucose-Capillary: 122 mg/dL — ABNORMAL HIGH (ref 70–99)
Glucose-Capillary: 119 mg/dL — ABNORMAL HIGH (ref 70–99)
Glucose-Capillary: 98 mg/dL (ref 70–99)
Glucose-Capillary: 129 mg/dL — ABNORMAL HIGH (ref 70–99)
Glucose-Capillary: 127 mg/dL — ABNORMAL HIGH (ref 70–99)
Glucose-Capillary: 165 mg/dL — ABNORMAL HIGH (ref 70–99)
Glucose-Capillary: 148 mg/dL — ABNORMAL HIGH (ref 70–99)
Glucose-Capillary: 164 mg/dL — ABNORMAL HIGH (ref 70–99)
Glucose-Capillary: 157 mg/dL — ABNORMAL HIGH (ref 70–99)
Glucose-Capillary: 147 mg/dL — ABNORMAL HIGH (ref 70–99)
Glucose-Capillary: 142 mg/dL — ABNORMAL HIGH (ref 70–99)
Glucose-Capillary: 151 mg/dL — ABNORMAL HIGH (ref 70–99)
Glucose-Capillary: 146 mg/dL — ABNORMAL HIGH (ref 70–99)
Glucose-Capillary: 127 mg/dL — ABNORMAL HIGH (ref 70–99)

## 2020-03-21 LAB — COMPREHENSIVE METABOLIC PANEL
ALT: 20 U/L (ref 0–44)
AST: 92 U/L — ABNORMAL HIGH (ref 15–41)
Albumin: 3.4 g/dL — ABNORMAL LOW (ref 3.5–5.0)
Alkaline Phosphatase: 54 U/L (ref 38–126)
Anion gap: 13 (ref 5–15)
BUN: 21 mg/dL (ref 8–23)
CO2: 22 mmol/L (ref 22–32)
Calcium: 8.8 mg/dL — ABNORMAL LOW (ref 8.9–10.3)
Chloride: 102 mmol/L (ref 98–111)
Creatinine, Ser: 1.68 mg/dL — ABNORMAL HIGH (ref 0.61–1.24)
GFR calc Af Amer: 47 mL/min — ABNORMAL LOW (ref >60)
GFR calc non Af Amer: 41 mL/min — ABNORMAL LOW (ref >60)
Glucose, Bld: 184 mg/dL — ABNORMAL HIGH (ref 70–99)
Potassium: 4.2 mmol/L (ref 3.5–5.1)
Sodium: 137 mmol/L (ref 135–145)
Total Bilirubin: 1.7 mg/dL — ABNORMAL HIGH (ref 0.3–1.2)
Total Protein: 5.4 g/dL — ABNORMAL LOW (ref 6.5–8.1)

## 2020-03-21 LAB — PROTIME-INR
INR: 1.3 — ABNORMAL HIGH (ref 0.8–1.2)
Prothrombin Time: 15.7 seconds — ABNORMAL HIGH (ref 11.4–15.2)

## 2020-03-21 LAB — COOXEMETRY PANEL
Carboxyhemoglobin: 1.2 % (ref 0.5–1.5)
Methemoglobin: 0.8 % (ref 0.0–1.5)
O2 Saturation: 67.8 %
Total hemoglobin: 9.3 g/dL — ABNORMAL LOW (ref 12.0–16.0)

## 2020-03-21 LAB — PHOSPHORUS: Phosphorus: 3.9 mg/dL (ref 2.5–4.6)

## 2020-03-21 LAB — LACTATE DEHYDROGENASE: LDH: 360 U/L — ABNORMAL HIGH (ref 98–192)

## 2020-03-21 LAB — CALCIUM, IONIZED: Calcium, Ionized, Serum: 4.8 mg/dL (ref 4.5–5.6)

## 2020-03-21 LAB — BRAIN NATRIURETIC PEPTIDE: B Natriuretic Peptide: 315.9 pg/mL — ABNORMAL HIGH (ref 0.0–100.0)

## 2020-03-21 LAB — SURGICAL PATHOLOGY

## 2020-03-21 LAB — PREPARE RBC (CROSSMATCH)

## 2020-03-21 MED ORDER — AMIODARONE LOAD VIA INFUSION
150.0000 mg | Freq: Once | INTRAVENOUS | Status: AC
  Administered 2020-03-21: 150 mg via INTRAVENOUS

## 2020-03-21 MED ORDER — METOLAZONE 2.5 MG PO TABS
2.5000 mg | ORAL_TABLET | Freq: Once | ORAL | Status: AC
  Administered 2020-03-21: 17:00:00 2.5 mg via ORAL
  Filled 2020-03-21: qty 1

## 2020-03-21 MED ORDER — AMIODARONE LOAD VIA INFUSION
150.0000 mg | Freq: Once | INTRAVENOUS | Status: AC
  Administered 2020-03-21: 150 mg via INTRAVENOUS
  Filled 2020-03-21: qty 83.34

## 2020-03-21 MED ORDER — ORAL CARE MOUTH RINSE: 15.0000 mL | Freq: Two times a day (BID) | OROMUCOSAL | Status: DC

## 2020-03-21 MED ORDER — WARFARIN SODIUM 2 MG PO TABS
2.0000 mg | ORAL_TABLET | Freq: Once | ORAL | Status: AC
  Administered 2020-03-21: 2 mg via ORAL
  Filled 2020-03-21: qty 1

## 2020-03-21 MED ORDER — SODIUM CHLORIDE 0.9% IV SOLUTION: Freq: Once | INTRAVENOUS | Status: AC

## 2020-03-21 MED ORDER — FUROSEMIDE 10 MG/ML IJ SOLN
15.0000 mg/h | INTRAVENOUS | Status: DC
  Administered 2020-03-21: 13:00:00 6 mg/h via INTRAVENOUS
  Administered 2020-03-22: 21:00:00 12 mg/h via INTRAVENOUS
  Administered 2020-03-23 – 2020-03-25 (×4): 15 mg/h via INTRAVENOUS
  Filled 2020-03-21: qty 20
  Filled 2020-03-21 (×6): qty 25

## 2020-03-21 MED ORDER — WARFARIN - PHARMACIST DOSING INPATIENT
Freq: Every day | Status: DC
  Administered 2020-03-31 – 2020-04-27 (×2): 1

## 2020-03-21 MED FILL — Heparin Sodium (Porcine) Inj 1000 Unit/ML: INTRAMUSCULAR | Qty: 30 | Status: AC

## 2020-03-21 MED FILL — Magnesium Sulfate Inj 50%: INTRAMUSCULAR | Qty: 10 | Status: AC

## 2020-03-21 MED FILL — Potassium Chloride Inj 2 mEq/ML: INTRAVENOUS | Qty: 40 | Status: AC

## 2020-03-21 MED FILL — Thrombin For Soln 5000 Unit: CUTANEOUS | Qty: 5000 | Status: AC

## 2020-03-21 NOTE — Progress Notes (Signed)
Arterial and Venous Sheath pulled per verbal order from Bensimhon MD. Manual pressure held for 25 minutes. Vital signs stable throughout procedure. Site is a level 0. Patient instructed on post removal directions. Will continue to monitor closely.

## 2020-03-21 NOTE — Progress Notes (Signed)
Inpatient Diabetes Program Recommendations  AACE/ADA: New Consensus Statement on Inpatient Glycemic Control (2015)  Target Ranges:  Prepandial:   less than 140 mg/dL      Peak postprandial:   less than 180 mg/dL (1-2 hours)      Critically ill patients:  140 - 180 mg/dL   Lab Results  Component Value Date   GLUCAP 122 (H) 03/21/2020   HGBA1C 10.5 (H) 03/31/2020    Review of Glycemic Control Results for [name] [name], [name] [name] (MRN [id number]) as of 03/21/2020 10:11  Ref. Range 03/21/2020 05:21 03/21/2020 06:02 03/21/2020 06:48 03/21/2020 07:56 03/21/2020 08:59  Glucose-Capillary Latest Ref Range: 70 - 99 mg/dL 154 (H) 157 (H) 141 (H) 129 (H) 122 (H)   Diabetes history: DM 2 Outpatient Diabetes medications:  Basaglar 50 units daily, Novolog 6 units tid with meals Current orders for Inpatient glycemic control:  IV insulin Inpatient Diabetes Program Recommendations:   Recommend continuing IV insulin.  Drip rates 6.5 units/ hr.   Upon transition, note home doses of insulin.  Likely will need both basal/meal coverage.   Thanks  Adah Perl, RN, BC-ADM Inpatient Diabetes Coordinator Pager [phone] (8a-5p)

## 2020-03-21 NOTE — Progress Notes (Signed)
ANTICOAGULATION CONSULT NOTE  Pharmacy Consult for heparin> warfarin Indication: post op LVAD  / Aflutter  Allergies  Allergen Reactions  . Liraglutide Nausea And Vomiting  . Lisinopril Cough    Patient Measurements: Height: 6' (182.9 cm) Weight: 115 kg (253 lb 8.5 oz) IBW/kg (Calculated) : 77.6 Heparin Dosing Weight: 101.2 kg  Vital Signs: Temp: 99 F (37.2 C) (04/09 1400) Temp Source: Core (04/09 1200) BP: 104/70 (04/09 1400) Pulse Rate: 103 (04/09 1400)  Labs: Recent Labs    03/19/20 0452 03/19/20 0452 03/19/20 1223 03/19/20 1533 03/19/2020 0305 04/05/2020 0812 03/27/2020 1512 03/27/2020 1522 03/30/2020 2001 03/31/2020 2007 04/02/2020 2133 03/27/2020 2133 03/21/20 0209 03/21/20 0222 03/21/20 0506 03/21/20 0506 03/21/20 0928 03/21/20 1305  HGB 11.4*   < >   < >  --  13.0   < > 9.4*   < > 9.7*   < > 9.2*   < > 9.5*   < > 9.2*   < > 8.2* 8.8*  HCT 34.2*   < >   < >  --  38.3*   < > 28.7*   < > 29.7*   < > 27.0*   < > 28.6*   < > 27.0*  --  24.0* 26.0*  PLT 141*   < >  --   --  173   < > 108*  108*  --  112*  --   --   --  109*  --   --   --   --   --   APTT  --   --   --   --   --   --  36  --   --   --   --   --   --   --   --   --   --   --   LABPROT  --   --   --   --   --   --  17.8*  --   --   --   --   --  15.7*  --   --   --   --   --   INR  --   --   --   --   --   --  1.5*  --   --   --   --   --  1.3*  --   --   --   --   --   HEPARINUNFRC 0.16*  --   --  0.24* 0.30  --   --   --   --   --   --   --   --   --   --   --   --   --   CREATININE 1.25*   < >   < >  --  1.45*   < > 1.44*   < > 1.58*  --  1.40*  --  1.68*  --   --   --   --   --    < > = values in this interval not displayed.    Estimated Creatinine Clearance: 53.6 mL/min (A) (by C-G formula based on SCr of 1.68 mg/dL (H)).   Medical History: Past Medical History:  Diagnosis Date  . Anxiety   . Arthritis   . CAD (coronary artery disease)    a.  1993 s/p MI - [employer] [facility];  b. s/p BMS to LAD  '00;  c. PTCA 2nd diagonal 2010;  d. 02/18/12 Cath: moderate nonobs dzs - med rx;  e.  01/2015 Cath: LM nl, LAD 40-23m ISR, 70-74m/d, d1 90p (3.0x16 Synergy DES), D2 50-60, LCX nl, OM1 50p, 91m (2.5x12 Synergy DES), RCA nl, EF 30-35%.  . Chronic combined systolic and diastolic CHF (congestive heart failure) ([place])    a. 12/2014 Echo: EF 30-35%, Gr2 DD, mod MR, sev dil LA.  . CKD (chronic kidney disease), stage III   . Depression   . ED (erectile dysfunction)   . GERD (gastroesophageal reflux disease)   . Hyperlipidemia   . Hypertension   . Ischemic cardiomyopathy    a. s/p St. [name] (Atlas) ICD implanted in [place] 2007;  b. 12/2014 Echo: Ef 30-35%.  . MVP (mitral valve prolapse)    a. s/p MV annuloplasty at [facility] 2004.  Marland Kitchen Persistent atrial fibrillation ([place])    a. noted on ICD interrogation '10 - not previously on [place] - CHA2DS2VASc = 5.  . Type II diabetes mellitus ([place])    uncontrolled    Medications:  Medications Prior to Admission  Medication Sig Dispense Refill Last Dose  . ALPRAZolam (XANAX) 0.25 MG tablet Take 0.25 mg by mouth 3 (three) times daily as needed for anxiety.    unknown  . amiodarone (PACERONE) 200 MG tablet Take 200 mg by mouth daily.    03/11/2020 at Unknown time  . apixaban (ELIQUIS) 5 MG TABS tablet Take 1 tablet (5 mg total) by mouth 2 (two) times daily. 60 tablet 1 03/11/2020 at 0600  . atorvastatin (LIPITOR) 40 MG tablet Take 1 tablet (40 mg total) by mouth daily at 6 PM. 30 tablet 0 03/10/2020 at Unknown time  . citalopram (CELEXA) 20 MG tablet Take 20 mg by mouth daily.    03/11/2020 at Unknown time  . furosemide (LASIX) 40 MG tablet Take 40 mg by mouth daily.    03/11/2020 at Unknown time  . insulin aspart (NOVOLOG FLEXPEN) 100 UNIT/ML FlexPen Inject 6 Units into the skin 3 (three) times daily with meals. 15 mL 0 03/11/2020 at Unknown time  . Insulin Glargine (BASAGLAR KWIKPEN) 100 UNIT/ML SOPN Inject 0.5 mLs (50 Units total) into the skin daily. 15 mL 1  03/11/2020 at Unknown time  . metoprolol succinate (TOPROL-XL) 100 MG 24 hr tablet Take 1 tablet (100 mg total) by mouth daily. 90 tablet 3 03/11/2020 at 0600  . nitroGLYCERIN (NITROSTAT) 0.4 MG SL tablet Place 1 tablet (0.4 mg total) under the tongue every 5 (five) minutes x 3 doses as needed for chest pain. 10 tablet 0 unknown  . pantoprazole (PROTONIX) 40 MG tablet Take 1 tablet (40 mg total) by mouth daily. 30 tablet 1 03/11/2020 at Unknown time  . potassium chloride SA (KLOR-CON) 20 MEQ tablet Take 20 mEq by mouth daily.    03/11/2020 at Unknown time  . tamsulosin (FLOMAX) 0.4 MG CAPS capsule Take 1 capsule (0.4 mg total) by mouth daily. 30 capsule 0 03/11/2020 at Unknown time  . ALPRAZolam (XANAX) 1 MG tablet Take 1 tablet (1 mg total) by mouth 2 (two) times daily as needed for anxiety. (Patient not taking: Reported on 02/27/2020) 10 tablet 0 Not Taking at Unknown time    Assessment: 12 YOM with atrial fibrillation on Eliquis PTA s/p R & L heart cath found to have 3v disease s/p  LVAD  Implant 4/8  H/h stable post op 8.8/26, pltc 100s little CT output no noted bleeding, Tbili 1.7 Discussed with MD will begin low dose warfarin tonight  Titrate slowly No heparin yet  Goal of Therapy:  INR 2-2.5 Heparin level 0.3-0.7 units/ml Monitor platelets by anticoagulation protocol: Yes   Plan:  Warfarin 2mg  x1 today Daily Protime  monitor s/s bleeding, Tbili   Bonnita Nasuti Pharm.D. CPP, BCPS Clinical Pharmacist [phone] 03/21/2020 3:15 PM

## 2020-03-21 NOTE — Progress Notes (Signed)
Advanced Heart Failure Rounding Note   Subjective:    S/p HM3 VAD 4/8 w MAZE procedure + LAA clipping.  Remains intubated. Weaning vent. Waking up  On epi 2 and milrinone 0.25  Swan #s CVP 13 PA 32/20 Thermo 5.5/2.4  Co-ox 68%  VAD Interrogation  Flow 4.7, Speed 5200, Power 4.0, Pulse Index 2.1. No PI events   Objective:   Weight Range:  Vital Signs:   Temp:  [97 F (36.1 C)-99 F (37.2 C)] 99 F (37.2 C) (04/09 1400) Pulse Rate:  [25-197] 103 (04/09 1400) Resp:  [11-29] 13 (04/09 1400) BP: (67-107)/(38-90) 104/70 (04/09 1400) SpO2:  [90 %-100 %] 98 % (04/09 1400) Arterial Line BP: (66-131)/(54-85) 131/85 (04/09 1400) FiO2 (%):  [40 %-50 %] 40 % (04/09 1200) Weight:  [600 kg] 115 kg (04/09 0300) Last BM Date: 03/11/20  Weight change: Filed Weights   04/02/2020 0400 03/29/2020 0205 03/21/20 0300  Weight: 108.1 kg 103 kg 115 kg    Intake/Output:   Intake/Output Summary (Last 24 hours) at 03/21/2020 1416 Last data filed at 03/21/2020 1400 Gross per 24 hour  Intake 6363.48 ml  Output 3353 ml  Net 3010.48 ml     Physical Exam: CVP 13 General: sedated on vent HEENT: normal  + ETT ecchymosis over left submandibular region Neck: supple. JVP to jawCarotids 2+ bilat; no bruits. No lymphadenopathy or thryomegaly appreciated. Cor: LVAD hum. Sternal dressing + CTs.   Lungs: Decreased in bases Abdomen: obese soft, nontender, +distended. No hepatosplenomegaly. No bruits or masses. Hypoactive bowel sounds. Driveline site bandaged Extremities: no cyanosis, clubbing, rash. Warm 2+ edema  Neuro: intubated sedated    Telemetry: AF 100-110 Personally reviewed    Labs: Basic Metabolic Panel: Recent Labs  Lab 03/19/20 1233 03/19/20 2150 03/25/2020 0305 03/13/2020 0305 03/19/2020 0812 04/06/2020 1253 03/16/2020 1512 04/11/2020 1522 03/26/2020 2001 03/26/2020 2007 04/11/2020 2133 03/28/2020 2133 03/21/20 4599 03/21/20 0222 03/21/20 0506 03/21/20 0928 03/21/20 1305  NA 136   --  133*   < >   < > 137 139   < > 136   < > 136   < > 137 137 137 138 137  K 3.9  --  3.7   < >   < > 3.0* 3.0*   < > 3.8   < > 3.7   < > 4.2 4.0 4.1 4.1 4.1  CL 98  --  94*  --    < > 97* 102  --  101  --  100  --  102  --   --   --   --   CO2 27  --  29  --   --   --  26  --  22  --   --   --  22  --   --   --   --   GLUCOSE 255*   < > 291*  --    < > 188* 180*  --  209*  --  211*  --  184*  --   --   --   --   BUN 16  --  21  --    < > 21 19  --  19  --  20  --  21  --   --   --   --   CREATININE 1.40*  --  1.45*  --    < > 0.70 1.44*  --  1.58*  --  1.40*  --  1.68*  --   --   --   --   CALCIUM 9.3  --  9.4   < >  --   --  8.6*  --  8.7*  --   --   --  8.8*  --   --   --   --   MG  --   --   --   --   --   --  1.6*  --  2.4  --   --   --  2.2  --   --   --   --   PHOS  --   --   --   --   --   --   --   --   --   --   --   --  3.9  --   --   --   --    < > = values in this interval not displayed.    Liver Function Tests: Recent Labs  Lab 03/15/20 0311 04/05/2020 0352 03/19/20 1233 03/21/20 0209  AST _0 92*  ALT _1 ALKPHOS 72 64 85 54  BILITOT 1.0 0.9 1.7* 1.7*  PROT 5.3* 5.8* 6.6 5.4*  ALBUMIN 3.2* 3.3* 3.8 3.4*   No results for input(s): LIPASE, AMYLASE in the last 168 hours. No results for input(s): AMMONIA in the last 168 hours.  CBC: Recent Labs  Lab 03/19/20 0452 03/19/20 1223 03/25/2020 0305 03/27/2020 0812 03/26/2020 1136 04/02/2020 1138 04/08/2020 1512 03/16/2020 1522 04/05/2020 2001 03/22/2020 2007 03/21/20 0209 03/21/20 0222 03/21/20 0506 03/21/20 0928 03/21/20 1305  WBC 6.6  --  8.0  --   --   --  10.1  --  12.9*  --  14.5*  --   --   --   --   NEUTROABS  --   --   --   --   --   --   --   --   --   --  13.1*  --   --   --   --   HGB 11.4*   < > 13.0   < > 10.5*   < > 9.4*   < > 9.7*   < > 9.5* 9.2* 9.2* 8.2* 8.8*  HCT 34.2*   < > 38.3*   < > 31.1*   < > 28.7*   < > 29.7*   < > 28.6* 27.0* 27.0* 24.0* 26.0*  MCV 92.7  --  89.3  --   --   --  92.3  --   92.5  --  93.2  --   --   --   --   PLT 141*  --  173  --  171  --  108*  108*  --  112*  --  109*  --   --   --   --    < > = values in this interval not displayed.    Cardiac Enzymes: No results for input(s): CKTOTAL, CKMB, CKMBINDEX, TROPONINI in the last 168 hours.  BNP: BNP (last 3 results) Recent Labs    01/04/20 1642 03/21/20 0209  BNP 165.2* 315.9*    ProBNP (last 3 results) No results for input(s): PROBNP in the last 8760 hours.    Other results:  Imaging: DG Chest Port 1 View  Result Date: 03/21/2020 CLINICAL DATA:  Status post left ventricular assist device. EXAM: PORTABLE CHEST 1 VIEW COMPARISON:  03/23/2020 FINDINGS: The  endotracheal tube, NG tube, right chest tube and Swan-Ganz catheters are stable. The pacer wires are stable. Left ventricular assist device in good position without complicating features. Streaky left basilar atelectasis but no edema, infiltrates or large effusions. IMPRESSION: 1. Stable support apparatus. 2. Streaky left basilar atelectasis. Electronically Signed   By: Marijo Sanes M.D.   On: 03/21/2020 08:25   DG CHEST PORT 1 VIEW  Result Date: 04/02/2020 CLINICAL DATA:  Left ventricular assist device placement EXAM: PORTABLE CHEST 1 VIEW COMPARISON:  03/15/2020 FINDINGS: Endotracheal tube tip 5 cm above the carina. Orogastric or nasogastric tube enters the abdomen. Swan-Ganz catheter inserted from a right internal jugular approach has its tip in the main pulmonary artery. Pacemaker/AICD remains in place. Left ventricular assist device now present. No sign of pneumothorax or pleural fluid. Atrial clip in place. The lungs are clear except for a volume loss in the left lower lobe. No pulmonary edema. IMPRESSION: Left ventricular assist device placement. No evidence of procedural complication. Left lower lobe volume loss. Lines and tubes well position radiographically. Electronically Signed   By: Nelson Chimes M.D.   On: 03/15/2020 15:05   ECHO  INTRAOPERATIVE TEE  Result Date: 03/30/2020  *INTRAOPERATIVE TRANSESOPHAGEAL REPORT *  Patient Name:   [name] [name] [facility] Date of Exam: 04/08/2020 Medical Rec #:  [id number]           Height:       72.0 in Accession #:    [id number]          Weight:       227.1 lb Date of Birth:  January 03, 1951           BSA:          2.25 m Patient Age:    18 years            BP:           133/87 mmHg Patient Gender: M                   HR:           80 bpm. Exam Location:  Anesthesiology Transesophogeal exam was perform intraoperatively during surgical procedure. Patient was closely monitored under general anesthesia during the entirety of examination. Indications:     CAD Native Vessel i25.10, LVAD placement Performing Phys: [id number] Z ATKINS Diagnosing Phys: Roberts Gaudy MD PRE-OP FINDINGS  Left Ventricle: There is abnormal (paradoxical) septal motion, consistent with left bundle branch block. The LV cavity was markedly enlarged and measured 6.4 cm at end-diastole at the mid-papillary level in the trans-gastric short-axis view. The ejection fraction was calculated to be 20-25% using the 2DQ border recognition software. There was global LV hypokinesis. LV wall thickness appeared normal without obvious thinned or aneursmal segments. On the post-bypass exam, the LV inflow cannula was well positioned and oriented towards the mitral valve. The cannula was not abutting the inter-ventricular septum and there was laminar inflow of blood. Right Ventricle: On the pre-bypass exam, the RV was normal in size and there was mildly reduced RV systolic function. On the post-bypass exam, there was mildly reduced RV systolic function. There was mild septal flattening noted intermittently, but the septum remained in a left to right convex orientation for the vast majority of the time. Left Atrium: The LAA cavity was dilated and measured 4.9 cm in the medial-lateral dimension. There was no thrombus noted with the LAA cavity or LAA appendage.   Right Atrium: The RA cavity was  moderately enlarged with an AICD lead seen entering the RA from the SVC and passing the tricuspid valve. Interatrial Septum: There is no evidence of a patent foramen ovale. There was no evidence of patent foramen oval or atrial septal defect by color Doppler and bubble study with valsalva and agitated albumin. On the post-bypass exam, there was no evidence of patent foramen ovale by color Doppler. Mitral Valve: No evidence of mitral valve stenosis. There was an anunuloplasty ring in the mitral position. The anterior leaflet was freely mobile and the posterior leaflet was restricted in motion. There was moderate mitral regurgitation with two jets seen on color Doppler. The MR jets involved the P2/A2 and anterior-lateral commissure and were within the annuloplasty ring. The mean trans-mitral gradient was 2-3 mm hg by CW Doppler. Tricuspid Valve: The tricuspid annulus was normal in diameter and measured 3.3 cm in the four-chamber view and 3.3 cm in the RV inflow-outflow view. There was an AICD lead traversing the tricuspid valve. On the post-bypass exam, there was mild tricuspid regurgitation. Aortic Valve: The aortic valve was tri-leaflet. The leaflets were mildly thickened and opened normallly. There was no aortic insufficiency seen on color Doppler. Aorta: There was a well defined aortic root and sino-tubular junction without dilatation or effacement. There was mild intimal thickening but no protruding atheromatous disease present.  Roberts Gaudy MD Electronically signed by Roberts Gaudy MD Signature Date/Time: 03/15/2020/8:31:18 PM    Final      Medications:     Scheduled Medications: . sodium chloride   Intravenous Once  . acetaminophen  1,000 mg Oral Q6H   Or  . acetaminophen (TYLENOL) oral liquid 160 mg/5 mL  1,000 mg Per Tube Q6H  . aspirin EC  325 mg Oral Daily   Or  . aspirin  324 mg Per Tube Daily   Or  . aspirin  300 mg Rectal Daily  . atorvastatin  80 mg Oral  q1800  . bisacodyl  10 mg Oral Daily   Or  . bisacodyl  10 mg Rectal Daily  . chlorhexidine gluconate (MEDLINE KIT)  15 mL Mouth Rinse BID  . Chlorhexidine Gluconate Cloth  6 each Topical Daily  . citalopram  20 mg Oral Daily  . docusate sodium  200 mg Oral Daily  . influenza vaccine adjuvanted  0.5 mL Intramuscular Tomorrow-1000  . mouth rinse  15 mL Mouth Rinse 10 times per day  . [START ON 03/22/2020] pantoprazole  40 mg Oral Daily  . sodium chloride flush  10-40 mL Intracatheter Q12H  . sodium chloride flush  10-40 mL Intracatheter Q12H  . sodium chloride flush  3 mL Intravenous Q12H  . tamsulosin  0.4 mg Oral Daily    Infusions: . sodium chloride Stopped (03/15/2020 1531)  . sodium chloride    . sodium chloride    . sodium chloride Stopped (03/21/20 0600)  . amiodarone 30 mg/hr (03/21/20 1400)  . cefUROXime (ZINACEF)  IV Stopped (03/21/20 0736)  . dexmedetomidine (PRECEDEX) IV infusion Stopped (03/21/20 7035)  . epinephrine 3 mcg/min (03/21/20 1400)  . furosemide (LASIX) infusion 6 mg/hr (03/21/20 1400)  . insulin 12 mL/hr at 03/21/20 1400  . lactated ringers    . lactated ringers    . lactated ringers 75 mL/hr at 03/21/20 1400  . milrinone 0.25 mcg/kg/min (03/21/20 1400)  . norepinephrine (LEVOPHED) Adult infusion Stopped (03/21/20 1049)  . vancomycin Stopped (03/21/20 0934)    PRN Medications: sodium chloride, acetaminophen, ALPRAZolam, dextrose, HYDROcodone-acetaminophen, lactated ringers, midazolam, morphine injection,  ondansetron (ZOFRAN) IV, ondansetron (ZOFRAN) IV, oxyCODONE, sodium chloride flush, sodium chloride flush, sodium chloride flush, traMADol   Assessment/Plan:   1. Acute on chronic systolic HF -> cardiogenic shock-> S/p HM3 LVAD - Echo 2016 EF 30-35% - Echo 1/21 EF 25% - Admitted with NYHA IV symptoms and AKI with attempts at diuresis.  - Echo this admit: EF 10-15% moderate RV dysfunction - R/LHC cath 3/21 with severe 3v CAD and low output with CI  1.7.  - Swan placed. Initial co-ox 39%. Initial PAPi 1.5. Started on milrinone 0.25. - Initially planned for CABG but given need for re-do sternotomy, relatively poor targets and longstanding low EF, VAD felt to be better option  - S/p teeth extractions by Dr. Enrique Sack. - S/p HM3 VAD 4/8 - On milrinone + NE + Epi post op.  - RV indices low. PaPI  <1.0  Turn Epi up to 3. Wean NE.  - start lasix gtt at 6 for diuresis - continue vent wean - VAD interrogated personally. PI low. Papi low. I turned speed down to 5100 - LDH 360  2. CAD with unstable angina - s/p previous PCI - cath 03/05/2020 with severe 3v CAD - no s/p VAD  3. AKI on CKD 3a - baseline creatinine 1.3-1.5 -> 2.2 to AKI - Creatinine 1.58 -> 1.4 -> 1.68 - Improving w/ hemodynamic support. Watch post-op - Renal u/s 4/5 unremarkable   4. Permanent AF - now s/p MAZE + LAA Clipping 4/8 - remains in AF with RVR - continue IV amiodarone - On Eliquis at home. Switch to warfarin with VAD  5. 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 DM2, poorly controlled - hgbA1c 11.1% - continue insulin and SSI   7. Post-op acute hypoxic respiratory failure - weaning vent  - extubate today  CRITICAL CARE Performed by: Glori Bickers  Total critical care time: 35 minutes  Critical care time was exclusive of separately billable procedures and treating other patients.  Critical care was necessary to treat or prevent imminent or life-threatening deterioration.  Critical care was time spent personally by me (independent of midlevel providers or residents) on the following activities: development of treatment plan with patient and/or surrogate as well as nursing, discussions with consultants, evaluation of patient's response to treatment, examination of patient, obtaining history from patient or surrogate, ordering and performing treatments and interventions, ordering  and review of laboratory studies, ordering and review of radiographic studies, pulse oximetry and re-evaluation of patient's condition.   Length of Stay: 9   Glori Bickers MD 03/21/2020, 2:16 PM  Advanced Heart Failure Team Pager [phone] (M-F; 7a - 4p)  Please contact [facility] for night-coverage after hours (4p -7a ) and weekends on [email or web address]

## 2020-03-21 NOTE — Progress Notes (Signed)
Verbal order from Atkins MD to continue Nitric wean and wean to extubate when patient is ready. Will communicate orders to RT.

## 2020-03-21 NOTE — Progress Notes (Signed)
EKG CRITICAL VALUE     12 lead EKG performed.  Critical value noted. Paris Lore, RN notified.   Warren Lacy, CCT 03/21/2020 7:51 AM

## 2020-03-21 NOTE — Progress Notes (Signed)
Anesthesiology Follow-up:  69 year old male with end-stage ischemic cardiomyopathy, now one day S/P Heartmate 3 LVAD implantation, Maze.   Extubated at 13:45 today, neuro intact, moving all extremities, mildly confused, hemodynamically stable on epi 3 mcg/kg/min milrinone 0.25 mcg/kg/min Levophed weaned off  VS: T- 37.2 BP- 114/71 HR- 120 (afib) RR-14 O2 Sat 98% on 3L [place] PA 43/22 CVP 15 CO/CI: 6.1/2.7  Pump Speed: 5100 Flow: 4.1 lpm PI 3.7  Speed 5100 rpm  K-4.0 Na-137 BUN/Cr.- 25/1.95  CXR: mild atelectasis, no significant airspace disease.  Impression: Doing well one post- VAD, hemodynamically stable despite tachycardia, renal function improving.   Roberts Gaudy

## 2020-03-21 NOTE — Plan of Care (Signed)
  Problem: Cardiovascular: Goal: Ability to achieve and maintain adequate cardiovascular perfusion will improve Outcome: Progressing Goal: Vascular access site(s) Level 0-1 will be maintained Outcome: Progressing   Problem: Cardiovascular: Goal: Ability to achieve and maintain adequate cardiovascular perfusion will improve Outcome: Progressing Goal: Vascular access site(s) Level 0-1 will be maintained Outcome: Progressing   Problem: Clinical Measurements: Goal: Ability to maintain clinical measurements within normal limits will improve Outcome: Progressing Goal: Will remain free from infection Outcome: Progressing Goal: Diagnostic test results will improve Outcome: Progressing Goal: Respiratory complications will improve Outcome: Progressing Goal: Cardiovascular complication will be avoided Outcome: Progressing

## 2020-03-21 NOTE — Procedures (Signed)
Extubation Procedure Note  Patient Details:   Name: [name] DOB: 1951/03/18 MRN: [id number]   Airway Documentation:  Airway 8 mm (Active)  Secured at (cm) 24 cm 03/21/20 1035  Measured From Lips 03/21/20 [place] 03/21/20 1035  Secured By [employer] 03/21/20 1035  Tube Holder Repositioned Yes 03/21/20 1035  Cuff Pressure (cm H2O) 28 cm H2O 03/21/20 0745  Site Condition Dry 03/21/20 1035   Vent end date: (not recorded) Vent end time: (not recorded)   Evaluation  O2 sats: stable throughout Complications: No apparent complications Patient did tolerate procedure well. Bilateral Breath Sounds: Clear, Diminished   Yes  4L/min [place] with NO at 5 ppm NIF-20 FVC 823ml  Revonda Standard 03/21/2020, 1:47 PM

## 2020-03-21 NOTE — Progress Notes (Addendum)
LVAD Coordinator Rounding Note:  HM III LVAD implanted on 03/16/2020 by Dr Orvan Seen under destination therapy criteria.  Pt remains intubated; failed wean this morning. Currently weaning again. HR elevated 120s-130s. Recently received repeat Amiodarone bolus due to HR in 140s.   Vital signs: HR: 125 afib Doppler Pressure: 92 Art line: 81/70 (75)  Automatic BP:  O2 Sat: 98% on 40% FiO2 Wt: 253.3 lbs   LVAD interrogation reveals:  Speed: 5100 Flow: 4.4 Power: 3.4 PI: 3.3  Alarms: none Events:  3 PI events today, 3 overnight Hematocrit:   Fixed speed: 5100 Low speed limit: 4900   Drive Line: Existing VAD dressing removed and site care performed using sterile technique. Drive line exit site cleaned with Chlora prep applicators x 2, allowed to dry, and guaze dressing with silver strip re-applied. Exit site unincorporated, the velour is fully implanted at exit site. 2 sutures intact. Scant bloody drainage at site. No redness, tenderness, foul odor or rash noted. Drive line anchor re-applied. Daily dressing change per nurse champion or VAD coordinator. Dressing change due 03/21/20.      Labs:  LDH trend: 360>  INR trend: 1.3>  Anticoagulation Plan: -INR Goal: 2.0-2.5 -ASA Dose: 81 mg   Blood Products:  Intraop: 6 FFP  DDAVP  420 cell saver   Device: -St [name] -Therapies: VF 194  Drips: Amiodarone 60 mg/hr Precedex  Epinephrine 3 mcg/min Lasix 6 mg/hr Insulin Milrinone 0.25 mcg/kg/min Levophed-- off  Respiratory: remains intubated; on nitric 5 ppm  Pt Education:  1. Pt intubated and sedated 2. No family at bedside    Plan/Recommendations:  1. Call VAD coordinator for any equipment or drive line issues.  2. Daily drive line dressing change per nurse champion or VAD coordinator.   Emerson Monte RN [place] Coordinator  Office: [phone]  24/7 Pager: [phone]

## 2020-03-22 ENCOUNTER — Inpatient Hospital Stay (HOSPITAL_COMMUNITY): Payer: Medicare HMO

## 2020-03-22 DIAGNOSIS — I255 Ischemic cardiomyopathy: Secondary | ICD-10-CM | POA: Diagnosis not present

## 2020-03-22 DIAGNOSIS — Z95811 Presence of heart assist device: Secondary | ICD-10-CM | POA: Diagnosis not present

## 2020-03-22 DIAGNOSIS — I361 Nonrheumatic tricuspid (valve) insufficiency: Secondary | ICD-10-CM | POA: Diagnosis not present

## 2020-03-22 DIAGNOSIS — R570 Cardiogenic shock: Secondary | ICD-10-CM | POA: Diagnosis not present

## 2020-03-22 LAB — COMPREHENSIVE METABOLIC PANEL
ALT: 26 U/L (ref 0–44)
ALT: 27 U/L (ref 0–44)
AST: 99 U/L — ABNORMAL HIGH (ref 15–41)
AST: 90 U/L — ABNORMAL HIGH (ref 15–41)
Albumin: 3 g/dL — ABNORMAL LOW (ref 3.5–5.0)
Albumin: 3.2 g/dL — ABNORMAL LOW (ref 3.5–5.0)
Alkaline Phosphatase: 59 U/L (ref 38–126)
Alkaline Phosphatase: 67 U/L (ref 38–126)
Anion gap: 14 (ref 5–15)
Anion gap: 13 (ref 5–15)
BUN: 30 mg/dL — ABNORMAL HIGH (ref 8–23)
BUN: 36 mg/dL — ABNORMAL HIGH (ref 8–23)
CO2: 21 mmol/L — ABNORMAL LOW (ref 22–32)
CO2: 21 mmol/L — ABNORMAL LOW (ref 22–32)
Calcium: 8.5 mg/dL — ABNORMAL LOW (ref 8.9–10.3)
Calcium: 9 mg/dL (ref 8.9–10.3)
Chloride: 101 mmol/L (ref 98–111)
Chloride: 102 mmol/L (ref 98–111)
Creatinine, Ser: 2.23 mg/dL — ABNORMAL HIGH (ref 0.61–1.24)
Creatinine, Ser: 2.6 mg/dL — ABNORMAL HIGH (ref 0.61–1.24)
GFR calc Af Amer: 33 mL/min — ABNORMAL LOW (ref >60)
GFR calc Af Amer: 28 mL/min — ABNORMAL LOW (ref >60)
GFR calc non Af Amer: 29 mL/min — ABNORMAL LOW (ref >60)
GFR calc non Af Amer: 24 mL/min — ABNORMAL LOW (ref >60)
Glucose, Bld: 145 mg/dL — ABNORMAL HIGH (ref 70–99)
Glucose, Bld: 134 mg/dL — ABNORMAL HIGH (ref 70–99)
Potassium: 3.6 mmol/L (ref 3.5–5.1)
Potassium: 3.7 mmol/L (ref 3.5–5.1)
Sodium: 136 mmol/L (ref 135–145)
Sodium: 136 mmol/L (ref 135–145)
Total Bilirubin: 2.2 mg/dL — ABNORMAL HIGH (ref 0.3–1.2)
Total Bilirubin: 2.4 mg/dL — ABNORMAL HIGH (ref 0.3–1.2)
Total Protein: 5.6 g/dL — ABNORMAL LOW (ref 6.5–8.1)
Total Protein: 6 g/dL — ABNORMAL LOW (ref 6.5–8.1)

## 2020-03-22 LAB — POCT I-STAT 7, (LYTES, BLD GAS, ICA,H+H)
Acid-base deficit: 1 mmol/L (ref 0.0–2.0)
Bicarbonate: 23.9 mmol/L (ref 20.0–28.0)
Calcium, Ion: 1.21 mmol/L (ref 1.15–1.40)
HCT: 27 % — ABNORMAL LOW (ref 39.0–52.0)
Hemoglobin: 9.2 g/dL — ABNORMAL LOW (ref 13.0–17.0)
O2 Saturation: 97 %
Patient temperature: 35.9
Potassium: 3.6 mmol/L (ref 3.5–5.1)
Sodium: 136 mmol/L (ref 135–145)
TCO2: 25 mmol/L (ref 22–32)
pCO2 arterial: 37.4 mmHg (ref 32.0–48.0)
pH, Arterial: 7.409 (ref 7.350–7.450)
pO2, Arterial: 87 mmHg (ref 83.0–108.0)

## 2020-03-22 LAB — CBC WITH DIFFERENTIAL/PLATELET
Abs Immature Granulocytes: 0.13 10*3/uL — ABNORMAL HIGH (ref 0.00–0.07)
Basophils Absolute: 0 10*3/uL (ref 0.0–0.1)
Basophils Relative: 0 %
Eosinophils Absolute: 0 10*3/uL (ref 0.0–0.5)
Eosinophils Relative: 0 %
HCT: 28.6 % — ABNORMAL LOW (ref 39.0–52.0)
Hemoglobin: 9.2 g/dL — ABNORMAL LOW (ref 13.0–17.0)
Immature Granulocytes: 1 %
Lymphocytes Relative: 2 %
Lymphs Abs: 0.2 10*3/uL — ABNORMAL LOW (ref 0.7–4.0)
MCH: 30.1 pg (ref 26.0–34.0)
MCHC: 32.2 g/dL (ref 30.0–36.0)
MCV: 93.5 fL (ref 80.0–100.0)
Monocytes Absolute: 0.9 10*3/uL (ref 0.1–1.0)
Monocytes Relative: 7 %
Neutro Abs: 10.8 10*3/uL — ABNORMAL HIGH (ref 1.7–7.7)
Neutrophils Relative %: 90 %
Platelets: 92 10*3/uL — ABNORMAL LOW (ref 150–400)
RBC: 3.06 MIL/uL — ABNORMAL LOW (ref 4.22–5.81)
RDW: 14.4 % (ref 11.5–15.5)
WBC: 12 10*3/uL — ABNORMAL HIGH (ref 4.0–10.5)
nRBC: 0 % (ref 0.0–0.2)

## 2020-03-22 LAB — COOXEMETRY PANEL
Carboxyhemoglobin: 1.2 % (ref 0.5–1.5)
Methemoglobin: 0.4 % (ref 0.0–1.5)
O2 Saturation: 64.7 %
Total hemoglobin: 9.2 g/dL — ABNORMAL LOW (ref 12.0–16.0)

## 2020-03-22 LAB — GLUCOSE, CAPILLARY
Glucose-Capillary: 117 mg/dL — ABNORMAL HIGH (ref 70–99)
Glucose-Capillary: 122 mg/dL — ABNORMAL HIGH (ref 70–99)
Glucose-Capillary: 122 mg/dL — ABNORMAL HIGH (ref 70–99)
Glucose-Capillary: 125 mg/dL — ABNORMAL HIGH (ref 70–99)
Glucose-Capillary: 128 mg/dL — ABNORMAL HIGH (ref 70–99)
Glucose-Capillary: 129 mg/dL — ABNORMAL HIGH (ref 70–99)
Glucose-Capillary: 130 mg/dL — ABNORMAL HIGH (ref 70–99)
Glucose-Capillary: 132 mg/dL — ABNORMAL HIGH (ref 70–99)
Glucose-Capillary: 137 mg/dL — ABNORMAL HIGH (ref 70–99)
Glucose-Capillary: 138 mg/dL — ABNORMAL HIGH (ref 70–99)
Glucose-Capillary: 144 mg/dL — ABNORMAL HIGH (ref 70–99)
Glucose-Capillary: 147 mg/dL — ABNORMAL HIGH (ref 70–99)
Glucose-Capillary: 95 mg/dL (ref 70–99)

## 2020-03-22 LAB — PROTIME-INR
INR: 1.5 — ABNORMAL HIGH (ref 0.8–1.2)
Prothrombin Time: 17.8 seconds — ABNORMAL HIGH (ref 11.4–15.2)

## 2020-03-22 LAB — MAGNESIUM: Magnesium: 2.1 mg/dL (ref 1.7–2.4)

## 2020-03-22 LAB — ECHOCARDIOGRAM LIMITED
Height: 72 in
Weight: 3940.06 oz

## 2020-03-22 LAB — LACTATE DEHYDROGENASE: LDH: 421 U/L — ABNORMAL HIGH (ref 98–192)

## 2020-03-22 LAB — PREPARE RBC (CROSSMATCH)

## 2020-03-22 LAB — PHOSPHORUS: Phosphorus: 4.9 mg/dL — ABNORMAL HIGH (ref 2.5–4.6)

## 2020-03-22 MED ORDER — SILDENAFIL CITRATE 20 MG PO TABS
20.0000 mg | ORAL_TABLET | Freq: Three times a day (TID) | ORAL | Status: DC
  Administered 2020-03-22 – 2020-03-24 (×9): 20 mg via ORAL
  Filled 2020-03-22 (×9): qty 1

## 2020-03-22 MED ORDER — ORAL CARE MOUTH RINSE
15.0000 mL | Freq: Two times a day (BID) | OROMUCOSAL | Status: DC
  Administered 2020-03-22 – 2020-03-27 (×8): 15 mL via OROMUCOSAL

## 2020-03-22 MED ORDER — METOLAZONE 2.5 MG PO TABS
2.5000 mg | ORAL_TABLET | Freq: Once | ORAL | Status: AC
  Administered 2020-03-22: 13:00:00 2.5 mg via ORAL
  Filled 2020-03-22: qty 1

## 2020-03-22 MED ORDER — HYDRALAZINE HCL 20 MG/ML IJ SOLN
INTRAMUSCULAR | Status: AC
  Filled 2020-03-22: qty 1

## 2020-03-22 MED ORDER — HYDRALAZINE HCL 20 MG/ML IJ SOLN
20.0000 mg | INTRAMUSCULAR | Status: DC | PRN
  Administered 2020-03-22 – 2020-04-23 (×19): 20 mg via INTRAVENOUS
  Filled 2020-03-22 (×18): qty 1

## 2020-03-22 MED ORDER — ACETAMINOPHEN 10 MG/ML IV SOLN
1000.0000 mg | Freq: Four times a day (QID) | INTRAVENOUS | Status: AC
  Administered 2020-03-22 – 2020-03-23 (×4): 1000 mg via INTRAVENOUS
  Filled 2020-03-22 (×3): qty 100

## 2020-03-22 MED ORDER — EPINEPHRINE HCL 5 MG/250ML IV SOLN IN NS
1.0000 ug/min | INTRAVENOUS | Status: DC
  Administered 2020-03-22 – 2020-03-25 (×2): 1 ug/min via INTRAVENOUS
  Filled 2020-03-22 (×2): qty 250

## 2020-03-22 MED ORDER — POTASSIUM CHLORIDE 10 MEQ/50ML IV SOLN
10.0000 meq | INTRAVENOUS | Status: AC
  Administered 2020-03-22 (×3): 10 meq via INTRAVENOUS
  Filled 2020-03-22 (×3): qty 50

## 2020-03-22 MED ORDER — WARFARIN SODIUM 2 MG PO TABS
2.0000 mg | ORAL_TABLET | Freq: Once | ORAL | Status: AC
  Administered 2020-03-22: 16:00:00 2 mg via ORAL
  Filled 2020-03-22: qty 1

## 2020-03-22 MED ORDER — METOCLOPRAMIDE HCL 5 MG/ML IJ SOLN
5.0000 mg | Freq: Three times a day (TID) | INTRAMUSCULAR | Status: AC
  Administered 2020-03-22 – 2020-03-23 (×6): 5 mg via INTRAVENOUS
  Filled 2020-03-22 (×6): qty 2

## 2020-03-22 MED ORDER — ACETAMINOPHEN 10 MG/ML IV SOLN
1000.0000 mg | Freq: Four times a day (QID) | INTRAVENOUS | Status: DC
  Filled 2020-03-22: qty 100

## 2020-03-22 MED ORDER — SODIUM CHLORIDE 0.9% IV SOLUTION: Freq: Once | INTRAVENOUS | Status: AC

## 2020-03-22 MED ORDER — DOPAMINE-DEXTROSE 3.2-5 MG/ML-% IV SOLN
2.5000 ug/kg/min | INTRAVENOUS | Status: DC
  Administered 2020-03-22: 1 ug/kg/min via INTRAVENOUS
  Filled 2020-03-22: qty 250

## 2020-03-22 NOTE — Progress Notes (Deleted)
*  PRELIMINARY RESULTS* Echocardiogram 2D Echocardiogram limited has been performed.  Bobbye Charleston 03/22/2020, 1:30 PM

## 2020-03-22 NOTE — Progress Notes (Signed)
ANTICOAGULATION CONSULT NOTE  Pharmacy Consult for heparin> warfarin Indication: post op LVAD  / Aflutter  Allergies  Allergen Reactions  . Liraglutide Nausea And Vomiting  . Lisinopril Cough    Patient Measurements: Height: 6' (182.9 cm) Weight: 111.7 kg (246 lb 4.1 oz) IBW/kg (Calculated) : 77.6 Heparin Dosing Weight: 101.2 kg  Vital Signs: Temp: 96.3 F (35.7 C) (04/10 0800) Temp Source: Core (04/10 0000) BP: 106/72 (04/10 0600) Pulse Rate: 124 (04/10 0800)  Labs: Recent Labs    03/19/20 1223 03/19/20 1533 03/23/2020 0305 03/23/2020 0812 03/19/2020 1512 03/13/2020 1522 03/21/20 0209 03/21/20 0222 03/21/20 1452 03/21/20 1459 03/21/20 1830 03/21/20 1830 03/22/20 0307 03/22/20 0413  HGB   < >  --  13.0   < > 9.4*   < > 9.5*   < > 8.7*   < > 8.5*   < > 9.2* 9.2*  HCT   < >  --  38.3*   < > 28.7*   < > 28.6*   < > 27.0*   < > 25.0*  --  28.6* 27.0*  PLT  --   --  173   < > 108*  108*   < > 109*  --  102*  --   --   --  92*  --   APTT  --   --   --   --  36  --   --   --   --   --   --   --   --   --   LABPROT  --   --   --   --  17.8*  --  15.7*  --   --   --   --   --  17.8*  --   INR  --   --   --   --  1.5*  --  1.3*  --   --   --   --   --  1.5*  --   HEPARINUNFRC  --  0.24* 0.30  --   --   --   --   --   --   --   --   --   --   --   CREATININE   < >  --  1.45*   < > 1.44*   < > 1.68*  --  1.95*  --   --   --  2.23*  --    < > = values in this interval not displayed.    Estimated Creatinine Clearance: 39.8 mL/min (A) (by C-G formula based on SCr of 2.23 mg/dL (H)).   Medical History: Past Medical History:  Diagnosis Date  . Anxiety   . Arthritis   . CAD (coronary artery disease)    a.  1993 s/p MI - [employer];  b. s/p BMS to LAD '00;  c. PTCA 2nd diagonal 2010;  d. 02/18/12 Cath: moderate nonobs dzs - med rx;  e.  01/2015 Cath: LM nl, LAD 40-8m ISR, 70-51m/d, d1 90p (3.0x16 Synergy DES), D2 50-60, LCX nl, OM1 50p, 54m (2.5x12 Synergy DES), RCA nl, EF  30-35%.  . Chronic combined systolic and diastolic CHF (congestive heart failure) ([place])    a. 12/2014 Echo: EF 30-35%, Gr2 DD, mod MR, sev dil LA.  . CKD (chronic kidney disease), stage III   . Depression   . ED (erectile dysfunction)   . GERD (gastroesophageal reflux disease)   . Hyperlipidemia   . Hypertension   .  Ischemic cardiomyopathy    a. s/p St. [name] (Atlas) ICD implanted in [place] 2007;  b. 12/2014 Echo: Ef 30-35%.  . MVP (mitral valve prolapse)    a. s/p MV annuloplasty at [facility] 2004.  Marland Kitchen Persistent atrial fibrillation ([place])    a. noted on ICD interrogation '10 - not previously on [place] - CHA2DS2VASc = 5.  . Type II diabetes mellitus ([place])    uncontrolled    Medications:  Medications Prior to Admission  Medication Sig Dispense Refill Last Dose  . ALPRAZolam (XANAX) 0.25 MG tablet Take 0.25 mg by mouth 3 (three) times daily as needed for anxiety.    unknown  . amiodarone (PACERONE) 200 MG tablet Take 200 mg by mouth daily.    03/11/2020 at Unknown time  . apixaban (ELIQUIS) 5 MG TABS tablet Take 1 tablet (5 mg total) by mouth 2 (two) times daily. 60 tablet 1 03/11/2020 at 0600  . atorvastatin (LIPITOR) 40 MG tablet Take 1 tablet (40 mg total) by mouth daily at 6 PM. 30 tablet 0 03/10/2020 at Unknown time  . citalopram (CELEXA) 20 MG tablet Take 20 mg by mouth daily.    03/11/2020 at Unknown time  . furosemide (LASIX) 40 MG tablet Take 40 mg by mouth daily.    03/11/2020 at Unknown time  . insulin aspart (NOVOLOG FLEXPEN) 100 UNIT/ML FlexPen Inject 6 Units into the skin 3 (three) times daily with meals. 15 mL 0 03/11/2020 at Unknown time  . Insulin Glargine (BASAGLAR KWIKPEN) 100 UNIT/ML SOPN Inject 0.5 mLs (50 Units total) into the skin daily. 15 mL 1 03/11/2020 at Unknown time  . metoprolol succinate (TOPROL-XL) 100 MG 24 hr tablet Take 1 tablet (100 mg total) by mouth daily. 90 tablet 3 03/11/2020 at 0600  . nitroGLYCERIN (NITROSTAT) 0.4 MG SL tablet Place 1 tablet (0.4 mg  total) under the tongue every 5 (five) minutes x 3 doses as needed for chest pain. 10 tablet 0 unknown  . pantoprazole (PROTONIX) 40 MG tablet Take 1 tablet (40 mg total) by mouth daily. 30 tablet 1 03/11/2020 at Unknown time  . potassium chloride SA (KLOR-CON) 20 MEQ tablet Take 20 mEq by mouth daily.    03/11/2020 at Unknown time  . tamsulosin (FLOMAX) 0.4 MG CAPS capsule Take 1 capsule (0.4 mg total) by mouth daily. 30 capsule 0 03/11/2020 at Unknown time  . ALPRAZolam (XANAX) 1 MG tablet Take 1 tablet (1 mg total) by mouth 2 (two) times daily as needed for anxiety. (Patient not taking: Reported on 03/03/2020) 10 tablet 0 Not Taking at Unknown time    Assessment: 63 YOM with atrial fibrillation on Eliquis PTA s/p R & L heart cath found to have 3v disease s/p  LVAD  Implant 4/8. Warfarin initiated on 4/9 after discussing with MD. Titrate slowly and will not add heparin yet.  INR today is 1.5  Hgb stable post op 9.2, pltc 92s little CT output no noted bleeding, Tbili 1.7>2.2   Goal of Therapy:  INR 2-2.5 Heparin level 0.3-0.7 units/ml Monitor platelets by anticoagulation protocol: Yes   Plan:  Warfarin 2mg  x1 today Daily PT/INR  Monitor s/s bleeding, Tbili  Vertis Kelch, PharmD, [facility] PGY2 Cardiology Pharmacy Resident Phone [phone] 03/22/2020       9:42 AM  Please check [email or web address] for unit-specific pharmacist phone numbers

## 2020-03-22 NOTE — Evaluation (Signed)
Physical Therapy Evaluation Patient Details Name: RAIDYN WASSINK MRN: [id number] DOB: June 15, 1951 Today's Date: 03/22/2020   History of Present Illness  69 yo male with history of CAD with prior stenting of the LAD, Diagonal and OM in an outside hospital, ischemic cardiomyopathy with ICD in place, chronic systolic CHF, MVP s/p mitral valve repair, permanent atrial fib on chronic anticoagulation, HTN, HLD, DM, depression admitted to [facility] with progressive weakness, fatigue, dyspnea and chest pressure. Troponin elevated at 0.06. He was transferred to [facility] for cardiac cath, showing severe 3 vessel disease. Plan for CABG but pt with AKI. Pt underwent multiple tooth extraction on 4/6 and LVAD placement on 4/8.  Clinical Impression  Pt presents to PT with deficits in functional mobility, gait, balance, endurance, strength, power, cognition. Pt limited by general fatigue and reports of nausea during session, with limited activity tolerance at this time. Pt requires significant physical assistance to perform bed mobility and transfer. Pt demonstrates very poor safety awareness, especially pertaining to line, and needs frequent cues for hand placement during session. Pt will benefit from continued aggressive mobilization to improve activity tolerance and reduce caregiver burden.    Follow Up Recommendations Supervision/Assistance - 24 hour;CIR(needs improved activity tolerance)    Equipment Recommendations  Wheelchair (measurements PT);Wheelchair cushion (measurements PT);Hospital bed;Other (comment)(mechanical lift if D/C home today)    Recommendations for Other Services       Precautions / Restrictions Precautions Precautions: Fall Precaution Comments: LVAD, sternal Restrictions Weight Bearing Restrictions: No Other Position/Activity Restrictions: sternal precautions      Mobility  Bed Mobility Overal bed mobility: Needs Assistance Bed Mobility: Supine to Sit;Sit to Supine      Supine to sit: Max assist Sit to supine: Total assist      Transfers Overall transfer level: Needs assistance Equipment used: 2 person hand held assist Transfers: Sit to/from Stand Sit to Stand: Max assist;+2 physical assistance;From elevated surface         General transfer comment: PT/OT blocking both knees to promote stand  Ambulation/Gait                Stairs            Wheelchair Mobility    Modified Rankin (Stroke Patients Only)       Balance                                             Pertinent Vitals/Pain Pain Assessment: Faces Faces Pain Scale: Hurts even more Pain Location: chest Pain Descriptors / Indicators: Grimacing Pain Intervention(s): Monitored during session    Home Living Family/patient expects to be discharged to:: Private residence Living Arrangements: Children Available Help at Discharge: Family;Available PRN/intermittently Type of Home: House Home Access: Stairs to enter Entrance Stairs-Rails: Can reach both Entrance Stairs-Number of Steps: 3 Home Layout: One level Home Equipment: Cane - single point;Grab bars - tub/shower      Prior Function Level of Independence: Needs assistance   Gait / Transfers Assistance Needed: pt ambulates with use of cane for household and limited community distances  ADL's / Homemaking Assistance Needed: pt reports sponge bathing due to falls risk in tub        Hand Dominance   Dominant Hand: Right    Extremity/Trunk Assessment   Upper Extremity Assessment Upper Extremity Assessment: Defer to OT evaluation    Lower Extremity Assessment Lower  Extremity Assessment: Generalized weakness(history of peripheral neuropathy bilaterally)    Cervical / Trunk Assessment Cervical / Trunk Assessment: Kyphotic  Communication   Communication: HOH  Cognition Arousal/Alertness: Awake/alert(requires frequent stimulation to remains alert) Behavior During Therapy: Flat  affect Overall Cognitive Status: Impaired/Different from baseline Area of Impairment: Orientation;Attention;Memory;Following commands;Safety/judgement;Awareness;Problem solving                 Orientation Level: (requires cues for place and situation) Current Attention Level: Selective Memory: Decreased short-term memory;Decreased recall of precautions Following Commands: Follows one step commands consistently;Follows one step commands with increased time Safety/Judgement: Decreased awareness of safety;Decreased awareness of deficits Awareness: Emergent Problem Solving: Slow processing;Decreased initiation;Requires verbal cues;Requires tactile cues        General Comments General comments (skin integrity, edema, etc.): pt tachy with A-fbi noted on monitor, HR up to 136 with activity. BP 90s/60s initially, does fall to 80s/60s at end of session, pt cites some dizziness with sitting. Pt also reporting nausea during activity but RN reports nausea meds provided recently. RN assisting with line management    Exercises     Assessment/Plan    PT Assessment Patient needs continued PT services  PT Problem List Decreased strength;Decreased activity tolerance;Decreased balance;Decreased mobility;Decreased cognition;Decreased knowledge of use of DME;Decreased safety awareness;Decreased knowledge of precautions;Cardiopulmonary status limiting activity;Pain;Impaired sensation       PT Treatment Interventions DME instruction;Gait training;Stair training;Functional mobility training;Therapeutic activities;Balance training;Therapeutic exercise;Neuromuscular re-education;Patient/family education;Cognitive remediation    PT Goals (Current goals can be found in the Care Plan section)  Acute Rehab PT Goals Patient Stated Goal: To improve mobility PT Goal Formulation: With patient Time For Goal Achievement: 04/05/20 Potential to Achieve Goals: Fair    Frequency Min 3X/week   Barriers to  discharge        Co-evaluation PT/OT/SLP Co-Evaluation/Treatment: Yes Reason for Co-Treatment: Complexity of the patient's impairments (multi-system involvement);Necessary to address cognition/behavior during functional activity;For patient/therapist safety;To address functional/ADL transfers PT goals addressed during session: Mobility/safety with mobility;Balance;Strengthening/ROM         AM-PAC PT "6 Clicks" Mobility  Outcome Measure Help needed turning from your back to your side while in a flat bed without using bedrails?: Total Help needed moving from lying on your back to sitting on the side of a flat bed without using bedrails?: Total Help needed moving to and from a bed to a chair (including a wheelchair)?: Total Help needed standing up from a chair using your arms (e.g., wheelchair or bedside chair)?: Total Help needed to walk in hospital room?: Total Help needed climbing 3-5 steps with a railing? : Total 6 Click Score: 6    End of Session Equipment Utilized During Treatment: Oxygen(4L/min) Activity Tolerance: Patient limited by fatigue;Treatment limited secondary to medical complications (Comment)(nausea) Patient left: in bed;with call bell/phone within reach;with bed alarm set Nurse Communication: Mobility status;Need for lift equipment PT Visit Diagnosis: Unsteadiness on feet (R26.81);Muscle weakness (generalized) (M62.81)    Time: 2122-4825 PT Time Calculation (min) (ACUTE ONLY): 28 min   Charges:   PT Evaluation $PT Eval High Complexity: 1 High          Zenaida Niece, PT, DPT Acute Rehabilitation Pager: [phone]   Zenaida Niece 03/22/2020, 2:55 PM

## 2020-03-22 NOTE — Progress Notes (Addendum)
Patient ID: [name] [name], male   DOB: October 04, 1951, 69 y.o.   MRN: [id number]    Advanced Heart Failure Rounding Note   Subjective:    - S/p HM3 VAD 4/8 w MAZE procedure + LAA clipping. - Extubated 4/9  On nasal cannula this morning, sore.    LVAD speed decreased yesterday to 5100, increased back to 5300 by Dr. Orvan Seen earlier today.    He is currently on milrinone 0.25, Lasix gtt at 6, amiodarone 60 mg/hr.  HR 110s in atrial fibrillation. MAP around 80.  UOP not vigorous, creatinine trending up 1.95 => 2.23.   INR 1.5, warfarin started.   Swan #s CVP 20 PA 48/27 Thermo CI 2.7 PaPI 1.05  Co-ox 64.7% LDH 421 tbili 2.2  VAD Interrogation  Flow 4.6, Speed 5300, Power 3.8, Pulse Index 3.4. 3 PI events   Objective:   Weight Range:  Vital Signs:   Temp:  [96.3 F (35.7 C)-99.1 F (37.3 C)] 96.3 F (35.7 C) (04/10 0800) Pulse Rate:  [93-182] 124 (04/10 0800) Resp:  [8-24] 14 (04/10 0800) BP: (73-127)/(53-112) 106/72 (04/10 0600) SpO2:  [87 %-100 %] 96 % (04/10 0800) Arterial Line BP: (72-131)/(60-88) 93/71 (04/10 0800) FiO2 (%):  [40 %] 40 % (04/09 1200) Weight:  [111.7 kg] 111.7 kg (04/10 0500) Last BM Date: 03/25/2020  Weight change: Filed Weights   03/31/2020 0205 03/21/20 0300 03/22/20 0500  Weight: 103 kg 115 kg 111.7 kg    Intake/Output:   Intake/Output Summary (Last 24 hours) at 03/22/2020 0834 Last data filed at 03/22/2020 0800 Gross per 24 hour  Intake 3919.46 ml  Output 2047 ml  Net 1872.46 ml     Physical Exam: CVP 20 General: NAD  HEENT: Normal. Neck: Supple, JVP 14+ cm. Carotids OK.  Cardiac:  Mechanical heart sounds with LVAD hum present.  Lungs:  CTAB, normal effort.  Abdomen:  NT, ND, no HSM. No bruits or masses. +BS  LVAD exit site: Well-healed and incorporated. Dressing dry and intact. No erythema or drainage. Stabilization device present and accurately applied. Driveline dressing changed daily per sterile technique. Extremities:  Warm  and dry. No cyanosis, clubbing, rash, or edema.  Neuro:  Awake/alert    Telemetry: AF 110s Personally reviewed    Labs: Basic Metabolic Panel: Recent Labs  Lab 03/19/2020 1512 03/27/2020 1522 03/14/2020 2001 03/15/2020 2001 03/27/2020 2007 04/08/2020 2133 03/21/20 9937 03/21/20 0222 03/21/20 1452 03/21/20 1459 03/21/20 1830 03/22/20 0307 03/22/20 0413  NA 139   < > 136   < >   < > 136 137   < > 138 137 137 136 136  K 3.0*   < > 3.8   < >   < > 3.7 4.2   < > 4.1 4.0 4.0 3.6 3.6  CL 102   < > 101  --   --  100 102  --  104  --   --  101  --   CO2 26  --  22  --   --   --  22  --  22  --   --  21*  --   GLUCOSE 180*   < > 209*  --   --  211* 184*  --  155*  --   --  145*  --   BUN 19   < > 19  --   --  20 21  --  25*  --   --  30*  --   CREATININE 1.44*   < >  1.58*  --   --  1.40* 1.68*  --  1.95*  --   --  2.23*  --   CALCIUM 8.6*   < > 8.7*   < >  --   --  8.8*  --  8.6*  --   --  8.5*  --   MG 1.6*  --  2.4  --   --   --  2.2  --  2.1  --   --  2.1  --   PHOS  --   --   --   --   --   --  3.9  --   --   --   --  4.9*  --    < > = values in this interval not displayed.    Liver Function Tests: Recent Labs  Lab 03/16/2020 0352 03/19/20 1233 03/21/20 0209 03/22/20 0307  AST 16 20 92* 99*  ALT 18 17 20 26   ALKPHOS 64 85 54 59  BILITOT 0.9 1.7* 1.7* 2.2*  PROT 5.8* 6.6 5.4* 5.6*  ALBUMIN 3.3* 3.8 3.4* 3.0*   No results for input(s): LIPASE, AMYLASE in the last 168 hours. No results for input(s): AMMONIA in the last 168 hours.  CBC: Recent Labs  Lab 03/24/2020 1512 04/02/2020 1522 03/25/2020 2001 04/08/2020 2007 03/21/20 0209 03/21/20 0222 03/21/20 1452 03/21/20 1459 03/21/20 1830 03/22/20 0307 03/22/20 0413  WBC 10.1  --  12.9*  --  14.5*  --  14.0*  --   --  12.0*  --   NEUTROABS  --   --   --   --  13.1*  --   --   --   --  10.8*  --   HGB 9.4*   < > 9.7*   < > 9.5*   < > 8.7* 9.2* 8.5* 9.2* 9.2*  HCT 28.7*   < > 29.7*   < > 28.6*   < > 27.0* 27.0* 25.0* 28.6* 27.0*   MCV 92.3  --  92.5  --  93.2  --  93.1  --   --  93.5  --   PLT 108*  108*  --  112*  --  109*  --  102*  --   --  92*  --    < > = values in this interval not displayed.    Cardiac Enzymes: No results for input(s): CKTOTAL, CKMB, CKMBINDEX, TROPONINI in the last 168 hours.  BNP: BNP (last 3 results) Recent Labs    01/04/20 1642 03/21/20 0209  BNP 165.2* 315.9*    ProBNP (last 3 results) No results for input(s): PROBNP in the last 8760 hours.    Other results:  Imaging: DG Chest Port 1 View  Result Date: 03/21/2020 CLINICAL DATA:  Status post left ventricular assist device. EXAM: PORTABLE CHEST 1 VIEW COMPARISON:  03/16/2020 FINDINGS: The endotracheal tube, NG tube, right chest tube and Swan-Ganz catheters are stable. The pacer wires are stable. Left ventricular assist device in good position without complicating features. Streaky left basilar atelectasis but no edema, infiltrates or large effusions. IMPRESSION: 1. Stable support apparatus. 2. Streaky left basilar atelectasis. Electronically Signed   By: Marijo Sanes M.D.   On: 03/21/2020 08:25   DG CHEST PORT 1 VIEW  Result Date: 03/19/2020 CLINICAL DATA:  Left ventricular assist device placement EXAM: PORTABLE CHEST 1 VIEW COMPARISON:  03/15/2020 FINDINGS: Endotracheal tube tip 5 cm above the carina. Orogastric or nasogastric tube enters the abdomen. Swan-Ganz catheter  inserted from a right internal jugular approach has its tip in the main pulmonary artery. Pacemaker/AICD remains in place. Left ventricular assist device now present. No sign of pneumothorax or pleural fluid. Atrial clip in place. The lungs are clear except for a volume loss in the left lower lobe. No pulmonary edema. IMPRESSION: Left ventricular assist device placement. No evidence of procedural complication. Left lower lobe volume loss. Lines and tubes well position radiographically. Electronically Signed   By: Nelson Chimes M.D.   On: 04/02/2020 15:05      Medications:     Scheduled Medications: . sodium chloride   Intravenous Once  . acetaminophen  1,000 mg Oral Q6H   Or  . acetaminophen (TYLENOL) oral liquid 160 mg/5 mL  1,000 mg Per Tube Q6H  . aspirin EC  325 mg Oral Daily   Or  . aspirin  324 mg Per Tube Daily   Or  . aspirin  300 mg Rectal Daily  . atorvastatin  80 mg Oral q1800  . bisacodyl  10 mg Oral Daily   Or  . bisacodyl  10 mg Rectal Daily  . chlorhexidine gluconate (MEDLINE KIT)  15 mL Mouth Rinse BID  . Chlorhexidine Gluconate Cloth  6 each Topical Daily  . citalopram  20 mg Oral Daily  . docusate sodium  200 mg Oral Daily  . influenza vaccine adjuvanted  0.5 mL Intramuscular Tomorrow-1000  . mouth rinse  15 mL Mouth Rinse BID  . metoCLOPramide (REGLAN) injection  5 mg Intravenous Q8H  . pantoprazole  40 mg Oral Daily  . sildenafil  20 mg Oral TID  . sodium chloride flush  10-40 mL Intracatheter Q12H  . sodium chloride flush  10-40 mL Intracatheter Q12H  . sodium chloride flush  3 mL Intravenous Q12H  . Warfarin - Pharmacist Dosing Inpatient   Does not apply q1600    Infusions: . sodium chloride Stopped (03/22/20 0745)  . sodium chloride    . sodium chloride    . sodium chloride Stopped (03/22/20 0755)  . acetaminophen    . amiodarone 60 mg/hr (03/22/20 0800)  . furosemide (LASIX) infusion 6 mg/hr (03/22/20 0800)  . insulin 5 mL/hr at 03/22/20 0800  . lactated ringers    . lactated ringers    . milrinone 0.25 mcg/kg/min (03/22/20 0800)  . norepinephrine (LEVOPHED) Adult infusion Stopped (03/21/20 1049)  . potassium chloride 50 mL/hr at 03/22/20 0800  . vancomycin Stopped (03/21/20 2213)    PRN Medications: sodium chloride, acetaminophen, dextrose, hydrALAZINE, HYDROcodone-acetaminophen, lactated ringers, ondansetron (ZOFRAN) IV, ondansetron (ZOFRAN) IV, oxyCODONE, sodium chloride flush, sodium chloride flush, sodium chloride flush, traMADol   Assessment/Plan:   1. Acute on chronic systolic  HF -> cardiogenic shock-> S/p HM3 LVAD - Echo 2016 EF 30-35% - Echo 1/21 EF 25% - Admitted with NYHA IV symptoms and AKI with attempts at diuresis.  - Echo this admit: EF 10-15% moderate RV dysfunction - R/LHC cath 3/21 with severe 3v CAD and low output with CI 1.7.  - Swan placed. Initial co-ox 39%. Initial PAPi 1.5. Started on milrinone 0.25. - Initially planned for CABG but given need for re-do sternotomy, relatively poor targets and longstanding low EF, VAD felt to be better option  - S/p teeth extractions by Dr. Enrique Sack. - S/p HM3 VAD 4/8 - Currently on milrinone 0.25 with MAP 80, CI 2.7.  PaPI remains low at 1.05.  Off epinephrine with elevated MAP overnight. Will need to watch closely, especially after turning up speed  earlier this morning.  Restart epinephrine at low dose for now as long as MAP does not rise significantly.  Agree with starting sildenafil 20 mg tid.  - CVP elevated to 20 with creatinine rising.  Will increase Lasix to 12 mg/hr.  May be able to improve RV function with diuresis. He is making urine but not vigorously.   - Will get limited echo to rule out effusion.  - LDH 421 - INR 1.5, warfarin begun.   2. CAD with unstable angina - s/p previous PCI - cath 02/20/2020 with severe 3v CAD - now s/p VAD  3. AKI on CKD 3a - Creatinine up to 2.23, worsening today.  May reflect RV failure.  Will add back low dose epinephrine as above and try to diurese more aggressively with CVP 20.  - Renal u/s 4/5 unremarkable   4. Permanent AF - now s/p MAZE + LAA Clipping 4/8 - remains in AF with mild RVR around 110 bpm.  - continue IV amiodarone - On Eliquis at home. Switched to warfarin with VAD  5. 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 DM2, poorly controlled - hgbA1c 11.1% - continue insulin and SSI   7. Post-op acute hypoxic respiratory failure - Extubated.   CRITICAL CARE Performed by: Loralie Champagne  Total critical care time: 40 minutes  Critical care time was exclusive of separately billable procedures and treating other patients.  Critical care was necessary to treat or prevent imminent or life-threatening deterioration.  Critical care was time spent personally by me (independent of midlevel providers or residents) on the following activities: development of treatment plan with patient and/or surrogate as well as nursing, discussions with consultants, evaluation of patient's response to treatment, examination of patient, obtaining history from patient or surrogate, ordering and performing treatments and interventions, ordering and review of laboratory studies, ordering and review of radiographic studies, pulse oximetry and re-evaluation of patient's condition.   Length of Stay: 10   Loralie Champagne MD 03/22/2020, 8:34 AM  Advanced Heart Failure Team Pager [phone] (M-F; 7a - 4p)  Please contact [facility] for night-coverage after hours (4p -7a ) and weekends on [email or web address]

## 2020-03-22 NOTE — Evaluation (Signed)
Occupational Therapy Evaluation Patient Details Name: [name] [name] MRN: [id number] DOB: 23-Dec-1950 Today's Date: 03/22/2020    History of Present Illness 69 yo male with history of CAD with prior stenting of the LAD, Diagonal and OM in an outside hospital, ischemic cardiomyopathy with ICD in place, chronic systolic CHF, MVP s/p mitral valve repair, permanent atrial fib on chronic anticoagulation, HTN, HLD, DM, depression admitted to [facility] with progressive weakness, fatigue, dyspnea and chest pressure. Troponin elevated at 0.06. He was transferred to [facility] for cardiac cath, showing severe 3 vessel disease. Plan for CABG but pt with AKI. Pt underwent multiple tooth extraction on 4/6 and LVAD placement on 4/8.   Clinical Impression   This 69 y/o male presents with the above. PTA pt reports mod independence with ADL and mobility using SPC, reports typically sponge bathes at home. Pt now presenting with post op pain, weakness, impaired cognition, decreased activity tolerance and mobility status. He currently requires two person assist (+3 for line management) for safe completion of functional transfers, tolerating dangling at EOB and sit<>stand today. Pt with reports of some dizziness sitting EOB and nausea (recently provided with nausea meds prior to session) and requiring encouragement to progress participation during session. He currently requires modA for overall UB, self-feeding and grooming ADL, totalA for LB ADL. Pt will benefit from continued acute OT services and recommend follow up therapy services at CIR level after discharge to progress pt towards his PLOF. Will follow.     Follow Up Recommendations  CIR;Supervision/Assistance - 24 hour    Equipment Recommendations  Other (comment)(TBD)           Precautions / Restrictions Precautions Precautions: Fall Precaution Comments: LVAD, sternal Restrictions Weight Bearing Restrictions: No Other Position/Activity  Restrictions: sternal precautions      Mobility Bed Mobility Overal bed mobility: Needs Assistance Bed Mobility: Supine to Sit;Sit to Supine     Supine to sit: Max assist;+2 for safety/equipment Sit to supine: Total assist      Transfers Overall transfer level: Needs assistance Equipment used: 2 person hand held assist Transfers: Sit to/from Stand Sit to Stand: Max assist;+2 physical assistance;From elevated surface         General transfer comment: PT/OT blocking both knees and use of bed pad to promote stand        ADL either performed or assessed with clinical judgement   ADL Overall ADL's : Needs assistance/impaired Eating/Feeding: Bed level;Moderate assistance Eating/Feeding Details (indicate cue type and reason): assist to bring hand to mouth and locate straw for drinking water Grooming: Moderate assistance   Upper Body Bathing: Moderate assistance;Sitting;Bed level   Lower Body Bathing: Total assistance;+2 for physical assistance;Sit to/from stand   Upper Body Dressing : Moderate assistance;Sitting   Lower Body Dressing: Total assistance;+2 for physical assistance;Sit to/from stand       Toileting- Water quality scientist and Hygiene: Total assistance;+2 for physical assistance;Sitting/lateral lean;Bed level       Functional mobility during ADLs: Maximal assistance;+2 for physical assistance(sit<>stand only)                           Pertinent Vitals/Pain Pain Assessment: Faces Faces Pain Scale: Hurts even more Pain Location: chest Pain Descriptors / Indicators: Grimacing Pain Intervention(s): Monitored during session     Hand Dominance Right   Extremity/Trunk Assessment Upper Extremity Assessment Upper Extremity Assessment: Generalized weakness   Lower Extremity Assessment Lower Extremity Assessment: Defer to PT evaluation  Cervical / Trunk Assessment Cervical / Trunk Assessment: Kyphotic   Communication  Communication Communication: HOH   Cognition Arousal/Alertness: Awake/alert(requires frequent stimulation to remains alert) Behavior During Therapy: Flat affect Overall Cognitive Status: Impaired/Different from baseline Area of Impairment: Orientation;Attention;Memory;Following commands;Safety/judgement;Awareness;Problem solving                 Orientation Level: (requires cues for place and situation) Current Attention Level: Selective Memory: Decreased short-term memory;Decreased recall of precautions Following Commands: Follows one step commands consistently;Follows one step commands with increased time Safety/Judgement: Decreased awareness of safety;Decreased awareness of deficits Awareness: Emergent Problem Solving: Slow processing;Decreased initiation;Requires verbal cues;Requires tactile cues General Comments: RN confirms pt with decreased cognition in comparison to before surgery   General Comments  pt tachy with A-fbi noted on monitor, HR up to 136 with activity. BP 90s/60s initially, does fall to 80s/60s at end of session, pt cites some dizziness with sitting. Pt also reporting nausea during activity but RN reports nausea meds provided recently. RN present during session and assisting with line management    Exercises     Shoulder Instructions      Home Living Family/patient expects to be discharged to:: Private residence Living Arrangements: Children Available Help at Discharge: Family;Available PRN/intermittently Type of Home: House Home Access: Stairs to enter [employer] of Steps: 3 Entrance Stairs-Rails: Can reach both Home Layout: One level     Bathroom Shower/Tub: Teacher, early years/pre: Standard     Home Equipment: Cane - single point;Grab bars - tub/shower          Prior Functioning/Environment Level of Independence: Needs assistance  Gait / Transfers Assistance Needed: pt ambulates with use of cane for household and  limited community distances ADL's / Homemaking Assistance Needed: pt reports sponge bathing due to falls risk in tub            OT Problem List: Decreased strength;Decreased range of motion;Decreased activity tolerance;Impaired balance (sitting and/or standing);Decreased cognition;Decreased safety awareness;Decreased knowledge of use of DME or AE;Decreased knowledge of precautions;Cardiopulmonary status limiting activity;Obesity;Pain      OT Treatment/Interventions: Self-care/ADL training;Therapeutic exercise;Energy conservation;DME and/or AE instruction;Therapeutic activities;Cognitive remediation/compensation;Patient/family education;Balance training    OT Goals(Current goals can be found in the care plan section) Acute Rehab OT Goals Patient Stated Goal: To improve mobility OT Goal Formulation: With patient Time For Goal Achievement: 04/05/20 Potential to Achieve Goals: Good  OT Frequency: Min 2X/week   Barriers to D/C:            Co-evaluation PT/OT/SLP Co-Evaluation/Treatment: Yes Reason for Co-Treatment: Complexity of the patient's impairments (multi-system involvement);For patient/therapist safety;To address functional/ADL transfers;Necessary to address cognition/behavior during functional activity PT goals addressed during session: Mobility/safety with mobility;Balance;Strengthening/ROM OT goals addressed during session: Strengthening/ROM      AM-PAC OT "6 Clicks" Daily Activity     Outcome Measure Help from another person eating meals?: A Lot Help from another person taking care of personal grooming?: A Lot Help from another person toileting, which includes using toliet, bedpan, or urinal?: Total Help from another person bathing (including washing, rinsing, drying)?: A Lot Help from another person to put on and taking off regular upper body clothing?: A Lot Help from another person to put on and taking off regular lower body clothing?: Total 6 Click Score: 10   End  of Session Nurse Communication: Mobility status;Need for lift equipment  Activity Tolerance: Patient tolerated treatment well;Patient limited by fatigue Patient left: in bed;with call bell/phone within reach;with bed alarm set  OT Visit Diagnosis: Other abnormalities of gait and mobility (R26.89);Muscle weakness (generalized) (M62.81);Other symptoms and signs involving cognitive function;Pain Pain - part of body: (chest)                Time: 0757-3225 OT Time Calculation (min): 31 min Charges:  OT General Charges $OT Visit: 1 Visit OT Evaluation $OT Eval High Complexity: 1 High  Lou Cal, OT Acute Rehabilitation Services Pager [phone] Office [phone]   [name] 03/22/2020, 5:48 PM

## 2020-03-22 NOTE — Progress Notes (Signed)
Inpatient Diabetes Program Recommendations  AACE/ADA: New Consensus Statement on Inpatient Glycemic Control (2015)  Target Ranges:  Prepandial:   less than 140 mg/dL      Peak postprandial:   less than 180 mg/dL (1-2 hours)      Critically ill patients:  140 - 180 mg/dL   Lab Results  Component Value Date   GLUCAP 125 (H) 03/22/2020   HGBA1C 10.5 (H) 03/22/2020    Review of Glycemic Control   Diabetes history: DM 2 Outpatient Diabetes medications:  Basaglar 50 units daily, Novolog 6 units tid with meals Current orders for Inpatient glycemic control:  IV insulin Inpatient Diabetes Program Recommendations:   Recommend continuing IV insulin.  Drip rates 5 units/ hr.  Pt extubated yesterday. Upon transition, note home doses of insulin.  Likely will need both basal/meal coverage.   Thanks  Tama Headings RN, MSN, BC-ADM Inpatient Diabetes Coordinator Team Pager [phone] (8a-5p)

## 2020-03-22 NOTE — Progress Notes (Signed)
2 Days Post-Op Procedure(s) (LRB): REDO STERNOTOMY WITH STERNAL PLATING (N/A) INSERTION OF IMPLANTABLE LEFT VENTRICULAR ASSIST DEVICE - HM3 (N/A) MAZE (N/A) TRANSESOPHAGEAL ECHOCARDIOGRAM (TEE) (N/A) Clipping Of Atrial Appendage (Left) Subjective: Very weak Objective: Vital signs in last 24 hours: Temp:  [96.3 F (35.7 C)-99.1 F (37.3 C)] 96.3 F (35.7 C) (04/10 0730) Pulse Rate:  [93-182] 118 (04/10 0730) Cardiac Rhythm: Atrial fibrillation;Bundle branch block (04/10 0000) Resp:  [8-24] 16 (04/10 0730) BP: (73-127)/(53-112) 106/72 (04/10 0600) SpO2:  [87 %-100 %] 98 % (04/10 0730) Arterial Line BP: (72-131)/(60-88) 101/71 (04/10 0730) FiO2 (%):  [40 %] 40 % (04/09 1200) Weight:  [111.7 kg] 111.7 kg (04/10 0500)  Hemodynamic parameters for last 24 hours: PAP: (28-56)/(14-28) 47/19 CVP:  [9 mmHg-21 mmHg] 13 mmHg CO:  [4.5 L/min-7.1 L/min] 6 L/min CI:  [2 L/min/m2-3.2 L/min/m2] 2.7 L/min/m2  Intake/Output from previous day: 04/09 0701 - 04/10 0700 In: 4049.2 [P.O.:100; I.V.:2832.6; Blood:315; NG/GT:60; IV Piggyback:741.6] Out: 2392 [Urine:1420; Emesis/NG output:300; Chest Tube:672] Intake/Output this shift: No intake/output data recorded.  General appearance: fatigued and slowed mentation Neurologic: intact Heart: irregularly irregular rhythm and mid systolic click present Lungs: clear to auscultation bilaterally Abdomen: soft, non-tender; bowel sounds normal; no masses,  no organomegaly Extremities: edema 1+ Wound: c/d/i  Lab Results: Recent Labs    03/21/20 1452 03/21/20 1459 03/22/20 0307 03/22/20 0413  WBC 14.0*  --  12.0*  --   HGB 8.7*   < > 9.2* 9.2*  HCT 27.0*   < > 28.6* 27.0*  PLT 102*  --  92*  --    < > = values in this interval not displayed.   BMET:  Recent Labs    03/21/20 1452 03/21/20 1459 03/22/20 0307 03/22/20 0413  NA 138   < > 136 136  K 4.1   < > 3.6 3.6  CL 104  --  101  --   CO2 22  --  21*  --   GLUCOSE 155*  --  145*  --    BUN 25*  --  30*  --   CREATININE 1.95*  --  2.23*  --   CALCIUM 8.6*  --  8.5*  --    < > = values in this interval not displayed.    PT/INR:  Recent Labs    03/22/20 0307  LABPROT 17.8*  INR 1.5*   ABG    Component Value Date/Time   PHART 7.409 03/22/2020 0413   HCO3 23.9 03/22/2020 0413   TCO2 25 03/22/2020 0413   ACIDBASEDEF 1.0 03/22/2020 0413   O2SAT 64.7 03/22/2020 0424   CBG (last 3)  Recent Labs    03/22/20 0412 03/22/20 0458 03/22/20 0726  GLUCAP 129* 122* 125*    Assessment/Plan: S/P Procedure(s) (LRB): REDO STERNOTOMY WITH STERNAL PLATING (N/A) INSERTION OF IMPLANTABLE LEFT VENTRICULAR ASSIST DEVICE - HM3 (N/A) MAZE (N/A) TRANSESOPHAGEAL ECHOCARDIOGRAM (TEE) (N/A) Clipping Of Atrial Appendage (Left) Mobilize Diuresis sildenafil  Increased pump speed to 5400 rpm    LOS: 10 days    [name] [name] 03/22/2020

## 2020-03-22 NOTE — Progress Notes (Signed)
  Echocardiogram 2D Echocardiogram limited has been performed.  [name] [name] 03/22/2020, 1:31 PM

## 2020-03-22 NOTE — Progress Notes (Signed)
Drive Line: Existing VAD dressing removed and site care performed using sterile technique. Drive line exit site cleaned with Chlora prep applicators x 2, allowed to dry, and guaze dressing with silver strip re-applied. Exit site unincorporated, the velour is fully implanted at exit site. 2 sutures intact. No redness, tenderness, foul odor or rash noted. Drive line anchor re-applied. Daily dressing change per nurse champion or VAD coordinator. Dressing change due 03/22/20.

## 2020-03-23 ENCOUNTER — Inpatient Hospital Stay (HOSPITAL_COMMUNITY): Payer: Medicare HMO

## 2020-03-23 DIAGNOSIS — I255 Ischemic cardiomyopathy: Secondary | ICD-10-CM | POA: Diagnosis not present

## 2020-03-23 DIAGNOSIS — R570 Cardiogenic shock: Secondary | ICD-10-CM | POA: Diagnosis not present

## 2020-03-23 DIAGNOSIS — Z95811 Presence of heart assist device: Secondary | ICD-10-CM | POA: Diagnosis not present

## 2020-03-23 LAB — COMPREHENSIVE METABOLIC PANEL
ALT: 24 U/L (ref 0–44)
ALT: 24 U/L (ref 0–44)
AST: 70 U/L — ABNORMAL HIGH (ref 15–41)
AST: 64 U/L — ABNORMAL HIGH (ref 15–41)
Albumin: 3 g/dL — ABNORMAL LOW (ref 3.5–5.0)
Albumin: 2.9 g/dL — ABNORMAL LOW (ref 3.5–5.0)
Alkaline Phosphatase: 78 U/L (ref 38–126)
Alkaline Phosphatase: 74 U/L (ref 38–126)
Anion gap: 15 (ref 5–15)
Anion gap: 16 — ABNORMAL HIGH (ref 5–15)
BUN: 40 mg/dL — ABNORMAL HIGH (ref 8–23)
BUN: 45 mg/dL — ABNORMAL HIGH (ref 8–23)
CO2: 21 mmol/L — ABNORMAL LOW (ref 22–32)
CO2: 20 mmol/L — ABNORMAL LOW (ref 22–32)
Calcium: 8.9 mg/dL (ref 8.9–10.3)
Calcium: 8.4 mg/dL — ABNORMAL LOW (ref 8.9–10.3)
Chloride: 98 mmol/L (ref 98–111)
Chloride: 99 mmol/L (ref 98–111)
Creatinine, Ser: 2.88 mg/dL — ABNORMAL HIGH (ref 0.61–1.24)
Creatinine, Ser: 3.2 mg/dL — ABNORMAL HIGH (ref 0.61–1.24)
GFR calc Af Amer: 24 mL/min — ABNORMAL LOW (ref >60)
GFR calc Af Amer: 22 mL/min — ABNORMAL LOW (ref >60)
GFR calc non Af Amer: 21 mL/min — ABNORMAL LOW (ref >60)
GFR calc non Af Amer: 19 mL/min — ABNORMAL LOW (ref >60)
Glucose, Bld: 116 mg/dL — ABNORMAL HIGH (ref 70–99)
Glucose, Bld: 118 mg/dL — ABNORMAL HIGH (ref 70–99)
Potassium: 3.5 mmol/L (ref 3.5–5.1)
Potassium: 3.4 mmol/L — ABNORMAL LOW (ref 3.5–5.1)
Sodium: 134 mmol/L — ABNORMAL LOW (ref 135–145)
Sodium: 135 mmol/L (ref 135–145)
Total Bilirubin: 2 mg/dL — ABNORMAL HIGH (ref 0.3–1.2)
Total Bilirubin: 2.2 mg/dL — ABNORMAL HIGH (ref 0.3–1.2)
Total Protein: 5.9 g/dL — ABNORMAL LOW (ref 6.5–8.1)
Total Protein: 5.9 g/dL — ABNORMAL LOW (ref 6.5–8.1)

## 2020-03-23 LAB — BPAM RBC
Blood Product Expiration Date: 202105082359
Blood Product Expiration Date: 202105082359
Blood Product Expiration Date: 202105082359
Blood Product Expiration Date: 202105082359
Blood Product Expiration Date: 202105082359
Blood Product Expiration Date: 202105082359
ISSUE DATE / TIME: 202104080658
ISSUE DATE / TIME: 202104080658
ISSUE DATE / TIME: 202104092044
ISSUE DATE / TIME: 202104101659
Unit Type and Rh: 5100
Unit Type and Rh: 5100
Unit Type and Rh: 5100
Unit Type and Rh: 5100
Unit Type and Rh: 5100
Unit Type and Rh: 5100

## 2020-03-23 LAB — CBC WITH DIFFERENTIAL/PLATELET
Abs Immature Granulocytes: 0.23 10*3/uL — ABNORMAL HIGH (ref 0.00–0.07)
Basophils Absolute: 0 10*3/uL (ref 0.0–0.1)
Basophils Relative: 0 %
Eosinophils Absolute: 0 10*3/uL (ref 0.0–0.5)
Eosinophils Relative: 0 %
HCT: 31.7 % — ABNORMAL LOW (ref 39.0–52.0)
Hemoglobin: 10.5 g/dL — ABNORMAL LOW (ref 13.0–17.0)
Immature Granulocytes: 1 %
Lymphocytes Relative: 1 %
Lymphs Abs: 0.2 10*3/uL — ABNORMAL LOW (ref 0.7–4.0)
MCH: 30.5 pg (ref 26.0–34.0)
MCHC: 33.1 g/dL (ref 30.0–36.0)
MCV: 92.2 fL (ref 80.0–100.0)
Monocytes Absolute: 1.4 10*3/uL — ABNORMAL HIGH (ref 0.1–1.0)
Monocytes Relative: 8 %
Neutro Abs: 16 10*3/uL — ABNORMAL HIGH (ref 1.7–7.7)
Neutrophils Relative %: 90 %
Platelets: 139 10*3/uL — ABNORMAL LOW (ref 150–400)
RBC: 3.44 MIL/uL — ABNORMAL LOW (ref 4.22–5.81)
RDW: 14.7 % (ref 11.5–15.5)
WBC: 17.8 10*3/uL — ABNORMAL HIGH (ref 4.0–10.5)
nRBC: 0 % (ref 0.0–0.2)

## 2020-03-23 LAB — COOXEMETRY PANEL
Carboxyhemoglobin: 1.5 % (ref 0.5–1.5)
Carboxyhemoglobin: 1.2 % (ref 0.5–1.5)
Methemoglobin: 1.2 % (ref 0.0–1.5)
Methemoglobin: 0.8 % (ref 0.0–1.5)
O2 Saturation: 54.1 %
O2 Saturation: 73.2 %
Total hemoglobin: 10.4 g/dL — ABNORMAL LOW (ref 12.0–16.0)
Total hemoglobin: 10.3 g/dL — ABNORMAL LOW (ref 12.0–16.0)

## 2020-03-23 LAB — TYPE AND SCREEN
ABO/RH(D): O POS
Antibody Screen: NEGATIVE
Unit division: 0
Unit division: 0
Unit division: 0
Unit division: 0
Unit division: 0
Unit division: 0

## 2020-03-23 LAB — GLUCOSE, CAPILLARY
Glucose-Capillary: 100 mg/dL — ABNORMAL HIGH (ref 70–99)
Glucose-Capillary: 107 mg/dL — ABNORMAL HIGH (ref 70–99)
Glucose-Capillary: 108 mg/dL — ABNORMAL HIGH (ref 70–99)
Glucose-Capillary: 110 mg/dL — ABNORMAL HIGH (ref 70–99)
Glucose-Capillary: 110 mg/dL — ABNORMAL HIGH (ref 70–99)
Glucose-Capillary: 112 mg/dL — ABNORMAL HIGH (ref 70–99)
Glucose-Capillary: 117 mg/dL — ABNORMAL HIGH (ref 70–99)
Glucose-Capillary: 122 mg/dL — ABNORMAL HIGH (ref 70–99)
Glucose-Capillary: 124 mg/dL — ABNORMAL HIGH (ref 70–99)
Glucose-Capillary: 126 mg/dL — ABNORMAL HIGH (ref 70–99)
Glucose-Capillary: 128 mg/dL — ABNORMAL HIGH (ref 70–99)
Glucose-Capillary: 129 mg/dL — ABNORMAL HIGH (ref 70–99)

## 2020-03-23 LAB — POCT I-STAT 7, (LYTES, BLD GAS, ICA,H+H)
Acid-base deficit: 1 mmol/L (ref 0.0–2.0)
Bicarbonate: 22.5 mmol/L (ref 20.0–28.0)
Calcium, Ion: 1.14 mmol/L — ABNORMAL LOW (ref 1.15–1.40)
HCT: 31 % — ABNORMAL LOW (ref 39.0–52.0)
Hemoglobin: 10.5 g/dL — ABNORMAL LOW (ref 13.0–17.0)
O2 Saturation: 96 %
Potassium: 3.4 mmol/L — ABNORMAL LOW (ref 3.5–5.1)
Sodium: 134 mmol/L — ABNORMAL LOW (ref 135–145)
TCO2: 23 mmol/L (ref 22–32)
pCO2 arterial: 32.9 mmHg (ref 32.0–48.0)
pH, Arterial: 7.443 (ref 7.350–7.450)
pO2, Arterial: 79 mmHg — ABNORMAL LOW (ref 83.0–108.0)

## 2020-03-23 LAB — PROTIME-INR
INR: 3.2 — ABNORMAL HIGH (ref 0.8–1.2)
Prothrombin Time: 32.5 seconds — ABNORMAL HIGH (ref 11.4–15.2)

## 2020-03-23 LAB — PROCALCITONIN: Procalcitonin: 1.96 ng/mL

## 2020-03-23 LAB — LACTATE DEHYDROGENASE: LDH: 438 U/L — ABNORMAL HIGH (ref 98–192)

## 2020-03-23 LAB — PHOSPHORUS: Phosphorus: 5.7 mg/dL — ABNORMAL HIGH (ref 2.5–4.6)

## 2020-03-23 LAB — MAGNESIUM: Magnesium: 2.1 mg/dL (ref 1.7–2.4)

## 2020-03-23 MED ORDER — SODIUM CHLORIDE 0.9 % IV SOLN
2.0000 g | Freq: Two times a day (BID) | INTRAVENOUS | Status: DC
  Administered 2020-03-23 (×2): 2 g via INTRAVENOUS
  Filled 2020-03-23 (×4): qty 2

## 2020-03-23 MED ORDER — COLCHICINE 0.3 MG HALF TABLET
0.3000 mg | ORAL_TABLET | Freq: Two times a day (BID) | ORAL | Status: DC
  Administered 2020-03-23 – 2020-03-24 (×4): 0.3 mg via ORAL
  Filled 2020-03-23 (×6): qty 1

## 2020-03-23 MED ORDER — METOLAZONE 5 MG PO TABS
5.0000 mg | ORAL_TABLET | Freq: Once | ORAL | Status: AC
  Administered 2020-03-23: 11:00:00 5 mg via ORAL
  Filled 2020-03-23: qty 1

## 2020-03-23 MED ORDER — POTASSIUM CHLORIDE 10 MEQ/50ML IV SOLN: 10.0000 meq | Freq: Once | INTRAVENOUS | Status: DC

## 2020-03-23 MED ORDER — POTASSIUM CHLORIDE CRYS ER 20 MEQ PO TBCR
40.0000 meq | EXTENDED_RELEASE_TABLET | Freq: Two times a day (BID) | ORAL | Status: AC
  Administered 2020-03-23 (×2): 40 meq via ORAL
  Filled 2020-03-23 (×2): qty 2

## 2020-03-23 MED ORDER — WARFARIN 0.5 MG HALF TABLET
0.5000 mg | ORAL_TABLET | Freq: Once | ORAL | Status: AC
  Administered 2020-03-23: 0.5 mg via ORAL
  Filled 2020-03-23: qty 1

## 2020-03-23 MED ORDER — ASPIRIN 81 MG PO CHEW
81.0000 mg | CHEWABLE_TABLET | Freq: Every day | ORAL | Status: DC
  Administered 2020-03-23 – 2020-03-26 (×3): 81 mg via ORAL
  Filled 2020-03-23 (×3): qty 1

## 2020-03-23 NOTE — Progress Notes (Signed)
Existing VAD dressing removed and site care performed using sterile technique. Drive line exit site cleaned with Chlora prep applicators x 2, allowed to dry,silver strip around insertion site in chevron fashionre-applied, split gauze place, covered with gauze and occlusive tape. Exit site unincorporated, the velour is fully implanted at exit site.2 sutures intact. No redness, tenderness, foul odor or rash noted. One drive line anchor re-applied.Daily dressing change per nurse champion or VAD coordinator. Dressing change due 03/24/20.

## 2020-03-23 NOTE — Progress Notes (Signed)
Pharmacy Antibiotic Note  [name] [name] is a 69 y.o. male admitted on 02/14/2020 now s/p LVAD HMIII implant. WBC increasing to 17.8.  Pharmacy has been consulted for empiric cefepime dosing.  Plan: Cefepime 2 g IV q12h Monitor cultures, renal function, and LOT  Height: 6' (182.9 cm) Weight: 118.3 kg (260 lb 12.9 oz)(bed weight,) IBW/kg (Calculated) : 77.6  Temp (24hrs), Avg:97.1 F (36.2 C), Min:96.1 F (35.6 C), Max:97.9 F (36.6 C)  Recent Labs  Lab 04/11/2020 2001 04/07/2020 2133 03/21/20 0209 03/21/20 1452 03/22/20 0307 03/22/20 1454 03/23/20 0335  WBC 12.9*  --  14.5* 14.0* 12.0*  --  17.8*  CREATININE 1.58*   < > 1.68* 1.95* 2.23* 2.60* 2.88*   < > = values in this interval not displayed.    Estimated Creatinine Clearance: 31.7 mL/min (A) (by C-G formula based on SCr of 2.88 mg/dL (H)).    Allergies  Allergen Reactions  . Liraglutide Nausea And Vomiting  . Lisinopril Cough    Antimicrobials this admission: Periop abx 4/8>>4/10 Cefepime 4/11 >>  Dose adjustments this admission: None  Microbiology results: 4/7 MRSA PCR: neg  Thank you for allowing pharmacy to be a part of this patient's care.  Vertis Kelch, PharmD, [facility] PGY2 Cardiology Pharmacy Resident Phone [phone] 03/23/2020       8:51 AM  Please check [email or web address] for unit-specific pharmacist phone numbers

## 2020-03-23 NOTE — Progress Notes (Signed)
3 Days Post-Op Procedure(s) (LRB): REDO STERNOTOMY WITH STERNAL PLATING (N/A) INSERTION OF IMPLANTABLE LEFT VENTRICULAR ASSIST DEVICE - HM3 (N/A) MAZE (N/A) TRANSESOPHAGEAL ECHOCARDIOGRAM (TEE) (N/A) Clipping Of Atrial Appendage (Left) Subjective: "I'm in torture, hot"  Objective: Vital signs in last 24 hours: Temp:  [96.1 F (35.6 C)-97.9 F (36.6 C)] 97 F (36.1 C) (04/11 0700) Pulse Rate:  [88-163] 113 (04/11 0700) Cardiac Rhythm: Atrial fibrillation (04/11 0400) Resp:  [8-22] 15 (04/11 0700) BP: (93-139)/(66-101) 139/101 (04/11 0515) SpO2:  [88 %-97 %] 95 % (04/11 0700) Arterial Line BP: (82-125)/(62-91) 114/79 (04/11 0700) Weight:  [118.3 kg] 118.3 kg (04/11 0336)  Hemodynamic parameters for last 24 hours: PAP: (42-61)/(18-40) 56/26 CVP:  [14 mmHg-32 mmHg] 23 mmHg CO:  [5.1 L/min-8.6 L/min] 5.1 L/min CI:  [2.3 L/min/m2-3.8 L/min/m2] 2.3 L/min/m2  Intake/Output from previous day: 04/10 0701 - 04/11 0700 In: 3333.6 [I.V.:2164.9; Blood:422.5; IV Piggyback:746.2] Out: 1960 [Urine:1330; Chest Tube:630] Intake/Output this shift: No intake/output data recorded.  General appearance: alert and no distress Neurologic: intact Heart: irregularly irregular rhythm Lungs: clear to auscultation bilaterally Abdomen: soft, non-tender; bowel sounds normal; no masses,  no organomegaly Extremities: edema 2+ Wound: dressed, dry  Lab Results: Recent Labs    03/22/20 0307 03/22/20 0413 03/23/20 0326 03/23/20 0335  WBC 12.0*  --   --  17.8*  HGB 9.2*   < > 10.5* 10.5*  HCT 28.6*   < > 31.0* 31.7*  PLT 92*  --   --  139*   < > = values in this interval not displayed.   BMET:  Recent Labs    03/22/20 1454 03/22/20 1454 03/23/20 0326 03/23/20 0335  NA 136   < > 134* 134*  K 3.7   < > 3.4* 3.5  CL 102  --   --  98  CO2 21*  --   --  21*  GLUCOSE 134*  --   --  116*  BUN 36*  --   --  40*  CREATININE 2.60*  --   --  2.88*  CALCIUM 9.0  --   --  8.9   < > = values in  this interval not displayed.    PT/INR:  Recent Labs    03/23/20 0335  LABPROT 32.5*  INR 3.2*   ABG    Component Value Date/Time   PHART 7.443 03/23/2020 0326   HCO3 22.5 03/23/2020 0326   TCO2 23 03/23/2020 0326   ACIDBASEDEF 1.0 03/23/2020 0326   O2SAT 54.1 03/23/2020 0344   CBG (last 3)  Recent Labs    03/23/20 0132 03/23/20 0323 03/23/20 0555  GLUCAP 122* 126* 110*    Assessment/Plan: S/P Procedure(s) (LRB): REDO STERNOTOMY WITH STERNAL PLATING (N/A) INSERTION OF IMPLANTABLE LEFT VENTRICULAR ASSIST DEVICE - HM3 (N/A) MAZE (N/A) TRANSESOPHAGEAL ECHOCARDIOGRAM (TEE) (N/A) Clipping Of Atrial Appendage (Left) Mobilize Diuresis increased speed to 5400     LOS: 11 days    [name] [name] 03/23/2020

## 2020-03-23 NOTE — Progress Notes (Signed)
Rehab Admissions Coordinator Note:  Per PT and OT recommendation, this patient was screened by Raechel Ache for appropriateness for an Inpatient Acute Rehab Consult.  At this time, we are recommending an Inpatient Rehab consult. AC will contact MD to request order.   Raechel Ache 03/23/2020, 5:23 PM  I can be reached at [phone].

## 2020-03-23 NOTE — Progress Notes (Addendum)
ANTICOAGULATION CONSULT NOTE  Pharmacy Consult for warfarin Indication: post op LVAD  / Aflutter  Allergies  Allergen Reactions  . Liraglutide Nausea And Vomiting  . Lisinopril Cough    Patient Measurements: Height: 6' (182.9 cm) Weight: 118.3 kg (260 lb 12.9 oz)(bed weight,) IBW/kg (Calculated) : 77.6 Heparin Dosing Weight: 101.2 kg  Vital Signs: Temp: 97 F (36.1 C) (04/11 0700) Temp Source: Core (Comment) (04/11 0000) BP: 104/89 (04/11 0800) Pulse Rate: 113 (04/11 0700)  Labs: Recent Labs    03/30/2020 1512 03/28/2020 1522 03/21/20 0209 03/21/20 0222 03/21/20 1452 03/21/20 1459 03/22/20 0307 03/22/20 0307 03/22/20 0413 03/22/20 0413 03/22/20 1454 03/23/20 0326 03/23/20 0335  HGB 9.4*   < > 9.5*   < > 8.7*   < > 9.2*   < > 9.2*   < >  --  10.5* 10.5*  HCT 28.7*   < > 28.6*   < > 27.0*   < > 28.6*   < > 27.0*  --   --  31.0* 31.7*  PLT 108*  108*   < > 109*   < > 102*  --  92*  --   --   --   --   --  139*  APTT 36  --   --   --   --   --   --   --   --   --   --   --   --   LABPROT 17.8*   < > 15.7*  --   --   --  17.8*  --   --   --   --   --  32.5*  INR 1.5*   < > 1.3*  --   --   --  1.5*  --   --   --   --   --  3.2*  CREATININE 1.44*   < > 1.68*   < > 1.95*   < > 2.23*  --   --   --  2.60*  --  2.88*   < > = values in this interval not displayed.    Estimated Creatinine Clearance: 31.7 mL/min (A) (by C-G formula based on SCr of 2.88 mg/dL (H)).   Medical History: Past Medical History:  Diagnosis Date  . Anxiety   . Arthritis   . CAD (coronary artery disease)    a.  1993 s/p MI - [employer];  b. s/p BMS to LAD '00;  c. PTCA 2nd diagonal 2010;  d. 02/18/12 Cath: moderate nonobs dzs - med rx;  e.  01/2015 Cath: LM nl, LAD 40-25m ISR, 70-77m/d, d1 90p (3.0x16 Synergy DES), D2 50-60, LCX nl, OM1 50p, 1m (2.5x12 Synergy DES), RCA nl, EF 30-35%.  . Chronic combined systolic and diastolic CHF (congestive heart failure) ([place])    a. 12/2014 Echo: EF 30-35%,  Gr2 DD, mod MR, sev dil LA.  . CKD (chronic kidney disease), stage III   . Depression   . ED (erectile dysfunction)   . GERD (gastroesophageal reflux disease)   . Hyperlipidemia   . Hypertension   . Ischemic cardiomyopathy    a. s/p St. [name] (Atlas) ICD implanted in [place] 2007;  b. 12/2014 Echo: Ef 30-35%.  . MVP (mitral valve prolapse)    a. s/p MV annuloplasty at [facility] 2004.  Marland Kitchen Persistent atrial fibrillation ([place])    a. noted on ICD interrogation '10 - not previously on [place] - CHA2DS2VASc = 5.  . Type II diabetes  mellitus ([place])    uncontrolled    Medications:  Medications Prior to Admission  Medication Sig Dispense Refill Last Dose  . ALPRAZolam (XANAX) 0.25 MG tablet Take 0.25 mg by mouth 3 (three) times daily as needed for anxiety.    unknown  . amiodarone (PACERONE) 200 MG tablet Take 200 mg by mouth daily.    03/11/2020 at Unknown time  . apixaban (ELIQUIS) 5 MG TABS tablet Take 1 tablet (5 mg total) by mouth 2 (two) times daily. 60 tablet 1 03/11/2020 at 0600  . atorvastatin (LIPITOR) 40 MG tablet Take 1 tablet (40 mg total) by mouth daily at 6 PM. 30 tablet 0 03/10/2020 at Unknown time  . citalopram (CELEXA) 20 MG tablet Take 20 mg by mouth daily.    03/11/2020 at Unknown time  . furosemide (LASIX) 40 MG tablet Take 40 mg by mouth daily.    03/11/2020 at Unknown time  . insulin aspart (NOVOLOG FLEXPEN) 100 UNIT/ML FlexPen Inject 6 Units into the skin 3 (three) times daily with meals. 15 mL 0 03/11/2020 at Unknown time  . Insulin Glargine (BASAGLAR KWIKPEN) 100 UNIT/ML SOPN Inject 0.5 mLs (50 Units total) into the skin daily. 15 mL 1 03/11/2020 at Unknown time  . metoprolol succinate (TOPROL-XL) 100 MG 24 hr tablet Take 1 tablet (100 mg total) by mouth daily. 90 tablet 3 03/11/2020 at 0600  . nitroGLYCERIN (NITROSTAT) 0.4 MG SL tablet Place 1 tablet (0.4 mg total) under the tongue every 5 (five) minutes x 3 doses as needed for chest pain. 10 tablet 0 unknown  . pantoprazole  (PROTONIX) 40 MG tablet Take 1 tablet (40 mg total) by mouth daily. 30 tablet 1 03/11/2020 at Unknown time  . potassium chloride SA (KLOR-CON) 20 MEQ tablet Take 20 mEq by mouth daily.    03/11/2020 at Unknown time  . tamsulosin (FLOMAX) 0.4 MG CAPS capsule Take 1 capsule (0.4 mg total) by mouth daily. 30 capsule 0 03/11/2020 at Unknown time  . ALPRAZolam (XANAX) 1 MG tablet Take 1 tablet (1 mg total) by mouth 2 (two) times daily as needed for anxiety. (Patient not taking: Reported on 02/22/2020) 10 tablet 0 Not Taking at Unknown time    Assessment: 86 YOM with atrial fibrillation on Eliquis PTA s/p R & L heart cath found to have 3v disease s/p  LVAD  Implant 4/8. Warfarin initiated on 4/9 after discussing with MD.  INR today is slightly supratherapeutic at 3.2 after only 2 doses of warfarin 2 mg.  Hgb stable post op 10.5, pltc 135, little CT output no noted bleeding, Tbili 1.7>2.2>2   Goal of Therapy:  INR 2-2.5 Heparin level 0.3-0.7 units/ml Monitor platelets by anticoagulation protocol: Yes   Plan:  Warfarin 0.5 mg x1 today Daily PT/INR  Monitor s/s bleeding, Tbili Reduce aspirin to 81 mg daily  Vertis Kelch, PharmD, [facility] PGY2 Cardiology Pharmacy Resident Phone [phone] 03/23/2020       8:43 AM  Please check [email or web address] for unit-specific pharmacist phone numbers

## 2020-03-23 NOTE — Plan of Care (Signed)
Pt is alert and oriented when fully awake. Pt has been lethargic during the first half of the night but has been restless the past few hours. Pt moans often and when questioned about pain and nausea, pt states, "I just can't breathe. I am so weak." Pt has been reassured continuously throughout the night. Pt is able to move all extremities and turn in the bed independently. Pt has refused to practice incentive spirometry exercises. Pt removes nasal canula from nose and when questioned why, pt only grunts. Pt has been given PRN pain medication once. Pt denies nausea. Pt has had some urine output and minimal mediastinal chest tube output. Pt's heart rate has been slightly more rate controlled. Lungs are clear and diminished. No signs of bleeding noted. Pt has been educated on coughing, splinting, breathing exercises, and sternal precautions often. Call bell is within reach, bed is in lowest position, and bed alarm is on. Problem: Cardiovascular: Goal: Ability to achieve and maintain adequate cardiovascular perfusion will improve Outcome: Not Progressing   Problem: Education: Goal: Understanding of CV disease, CV risk reduction, and recovery process will improve Outcome: Not Progressing   Problem: Activity: Goal: Ability to return to baseline activity level will improve Outcome: Not Progressing   Problem: Education: Goal: Knowledge of General Education information will improve Description: Including pain rating scale, medication(s)/side effects and non-pharmacologic comfort measures Outcome: Not Progressing   Problem: Coping: Goal: Level of anxiety will decrease Outcome: Not Progressing

## 2020-03-23 NOTE — Progress Notes (Signed)
Patient ID: [name] [name], male   DOB: [birth year], 69 y.o.   MRN: [id number]    Advanced Heart Failure Rounding Note   Subjective:    - S/p HM3 VAD 4/8 w MAZE procedure + LAA clipping. - Extubated 4/9  On nasal cannula this morning, sore.    LVAD speed increased to 5400 earlier today.  I looked at echo from yesterday, moderate-severe RV dysfunction, septum mildly left shifted, trivial pericardial effusion.    He is currently on milrinone 0.375, epinephrine 1, Lasix gtt at 12, NO 5 ppm, amiodarone 60 mg/hr.  HR 100s in atrial fibrillation. MAP 80s.  UOP not vigorous, creatinine trending up 1.95 => 2.23 => 2.88.   CXR: Left base hazy opacification  INR 3.2 today   Swan #s CVP 19 PA 56/25 Thermo CI 2.3 PaPI 1.6  Co-ox 54% LDH 421 => 438 tbili 2.2 => 2  VAD Interrogation  Flow 4.6, Speed 5400, Power 3.9, Pulse Index 3.5. No PI events   Objective:   Weight Range:  Vital Signs:   Temp:  [96.1 F (35.6 C)-97.9 F (36.6 C)] 97 F (36.1 C) (04/11 0700) Pulse Rate:  [88-163] 113 (04/11 0700) Resp:  [8-22] 15 (04/11 0700) BP: (93-139)/(66-101) 104/89 (04/11 0800) SpO2:  [88 %-97 %] 95 % (04/11 0700) Arterial Line BP: (82-125)/(62-91) 114/79 (04/11 0700) Weight:  [118.3 kg] 118.3 kg (04/11 0336) Last BM Date: 03/29/2020  Weight change: Filed Weights   03/21/20 0300 03/22/20 0500 03/23/20 0336  Weight: 115 kg 111.7 kg 118.3 kg    Intake/Output:   Intake/Output Summary (Last 24 hours) at 03/23/2020 0839 Last data filed at 03/23/2020 0600 Gross per 24 hour  Intake 3161.55 ml  Output 1675 ml  Net 1486.55 ml     Physical Exam: CVP 19 General: Well appearing this am. NAD.  HEENT: Normal. Neck: Supple, JVP 16+ cm. Carotids OK.  Cardiac:  Mechanical heart sounds with LVAD hum present.  Lungs:  CTAB, normal effort.  Abdomen:  NT, ND, no HSM. No bruits or masses. +BS  LVAD exit site: Well-healed and incorporated. Dressing dry and intact. No erythema or drainage.  Stabilization device present and accurately applied. Driveline dressing changed daily per sterile technique. Extremities:  Warm and dry. No cyanosis, clubbing, rash.  1+ ankle edema.  Neuro:  Alert & oriented x 3. Cranial nerves grossly intact. Moves all 4 extremities w/o difficulty. Affect pleasant     Telemetry: AF 100s Personally reviewed    Labs: Basic Metabolic Panel: Recent Labs  Lab 03/30/2020 2001 04/05/2020 2007 03/21/20 2426 03/21/20 0222 03/21/20 1452 03/21/20 1459 03/22/20 0307 03/22/20 0413 03/22/20 1454 03/23/20 0326 03/23/20 0335  NA 136   < > 137   < > 138   < > 136 136 136 134* 134*  K 3.8   < > 4.2   < > 4.1   < > 3.6 3.6 3.7 3.4* 3.5  CL 101   < > 102  --  104  --  101  --  102  --  98  CO2 22   < > 22  --  22  --  21*  --  21*  --  21*  GLUCOSE 209*   < > 184*  --  155*  --  145*  --  134*  --  116*  BUN 19   < > 21  --  25*  --  30*  --  36*  --  40*  CREATININE 1.58*   < >  1.68*  --  1.95*  --  2.23*  --  2.60*  --  2.88*  CALCIUM 8.7*   < > 8.8*   < > 8.6*   < > 8.5*  --  9.0  --  8.9  MG 2.4  --  2.2  --  2.1  --  2.1  --   --   --  2.1  PHOS  --   --  3.9  --   --   --  4.9*  --   --   --  5.7*   < > = values in this interval not displayed.    Liver Function Tests: Recent Labs  Lab 03/19/20 1233 03/21/20 0209 03/22/20 0307 03/22/20 1454 03/23/20 0335  AST 20 92* 99* 90* 70*  ALT 17 20 26 27 24   ALKPHOS 85 54 59 67 78  BILITOT 1.7* 1.7* 2.2* 2.4* 2.0*  PROT 6.6 5.4* 5.6* 6.0* 5.9*  ALBUMIN 3.8 3.4* 3.0* 3.2* 3.0*   No results for input(s): LIPASE, AMYLASE in the last 168 hours. No results for input(s): AMMONIA in the last 168 hours.  CBC: Recent Labs  Lab 03/24/2020 2001 03/13/2020 2007 03/21/20 0209 03/21/20 0222 03/21/20 1452 03/21/20 1459 03/21/20 1830 03/22/20 0307 03/22/20 0413 03/23/20 0326 03/23/20 0335  WBC 12.9*  --  14.5*  --  14.0*  --   --  12.0*  --   --  17.8*  NEUTROABS  --   --  13.1*  --   --   --   --  10.8*  --    --  16.0*  HGB 9.7*   < > 9.5*   < > 8.7*   < > 8.5* 9.2* 9.2* 10.5* 10.5*  HCT 29.7*   < > 28.6*   < > 27.0*   < > 25.0* 28.6* 27.0* 31.0* 31.7*  MCV 92.5  --  93.2  --  93.1  --   --  93.5  --   --  92.2  PLT 112*  --  109*  --  102*  --   --  92*  --   --  139*   < > = values in this interval not displayed.    Cardiac Enzymes: No results for input(s): CKTOTAL, CKMB, CKMBINDEX, TROPONINI in the last 168 hours.  BNP: BNP (last 3 results) Recent Labs    01/04/20 1642 03/21/20 0209  BNP 165.2* 315.9*    ProBNP (last 3 results) No results for input(s): PROBNP in the last 8760 hours.    Other results:  Imaging: DG Chest 1 View  Result Date: 03/22/2020 CLINICAL DATA:  Open heart surgery. EXAM: CHEST  1 VIEW COMPARISON:  Radiograph 03/21/2020 FINDINGS: LEFT-sided pacemaker and LVAD device noted. Swan-Ganz catheter with tip in the main pulmonary artery. RIGHT chest tube in place without pneumothorax. Interval extubation Cardiomegaly with mild central venous congestion. No overt pulmonary edema. Probable LEFT effusion. No pneumothorax. IMPRESSION: 1. Interval extubation without complication. 2. Stable additional support apparatus. 3. Cardiomegaly and central venous congestion with LEFT basilar atelectasis and small effusion. Electronically Signed   By: Suzy Bouchard M.D.   On: 03/22/2020 08:39   DG Chest Port 1 View  Result Date: 03/23/2020 CLINICAL DATA:  Post open heart surgery. EXAM: PORTABLE CHEST 1 VIEW COMPARISON:  03/22/2020 FINDINGS: Left ventricular assist device and left-sided cardiac pacemaker unchanged. Right IJ Swan-Ganz catheter with tip over the main pulmonary artery segment unchanged. Catheter from below courses over the right heart  with projecting in the region of the main pulmonary artery segment unchanged. Right-sided chest tube unchanged. Lungs are adequately inflated demonstrate mild worsening opacification over the right base which may be due to atelectasis or  infection. Slight worsening opacification in the left retrocardiac region which may be due to effusion/atelectasis. Mild stable prominence of the perihilar vessels suggesting mild vascular congestion. Stable cardiomegaly. Remainder the exam is unchanged. IMPRESSION: 1. Slight worsening hazy opacification in the left base/retrocardiac region as well as right base. Findings may be due to atelectasis/effusion versus infection. 2.  Support apparatus as described. Electronically Signed   By: Marin Olp M.D.   On: 03/23/2020 08:23   ECHOCARDIOGRAM LIMITED  Result Date: 03/22/2020    ECHOCARDIOGRAM LIMITED REPORT   Patient Name:   [name] [name] Date of Exam: 03/22/2020 Medical Rec #:  [id number]           Height:       72.0 in Accession #:    [id number]          Weight:       246.3 lb Date of Birth:  1951-02-06           BSA:          2.328 m Patient Age:    82 years            BP:           93/67 mmHg Patient Gender: M                   HR:           113 bpm. Exam Location:  Inpatient Procedure: Limited Echo and Color Doppler Indications:    I31.3 (Rule out)Pericardial effusion (noninflammatory)  History:        Patient has prior history of Echocardiogram examinations, most                 recent 03/13/2020. LV DysFx, CAD, Abnormal ECG and Defibrillator,                 Mitral Valve Disease, Arrythmias:Atrial Fibrillation;                 Signs/Symptoms:Chest Pain. Cardiac shock. LVAD. Elevated                 troponin.  Sonographer:    Roseanna Rainbow RDCS Referring Phys: [place] Comments: Patient is morbidly obese and Technically difficult study due to poor echo windows. Image acquisition challenging due to patient body habitus and LVAD. Only limited off axis views due to LVAD and wound dressings. IMPRESSIONS  1. Images are poor quality and cannot comment on global LV function. . Left ventricular ejection fraction, by estimation, is 20 to 25%. The left ventricle has severely decreased  function. The left ventricle demonstrates global hypokinesis. There is moderate concentric left ventricular hypertrophy.  2. Aortic dilatation noted. There is mild dilatation of the ascending aorta and of the aortic root measuring 41 mm and 70mm respectively.  3. Right ventricular systolic function is severely reduced. The right ventricular size is moderately enlarged.  4. The aortic valve is tricuspid. Mild aortic valve sclerosis is present, with no evidence of aortic valve stenosis. FINDINGS  Left Ventricle: Images are poor quality and cannot comment on global LV function. Left ventricular ejection fraction, by estimation, is 20 to 25%. The left ventricle has severely decreased function. The left ventricle demonstrates global hypokinesis. There is moderate concentric  left ventricular hypertrophy. Right Ventricle: The right ventricular size is moderately enlarged. Right ventricular systolic function is severely reduced. Left Atrium: Left atrial size was not well visualized. Right Atrium: Right atrial size was not well visualized. Tricuspid Valve: Tricuspid valve regurgitation is mild. Aortic Valve: The aortic valve is tricuspid. Mild aortic valve sclerosis is present, with no evidence of aortic valve stenosis. Pulmonic Valve: The pulmonic valve was not well visualized. Aorta: Aortic dilatation noted. There is mild dilatation of the ascending aorta and of the aortic root measuring 41 mm. Additional Comments: A pacer wire is visualized.  LEFT VENTRICLE PLAX 2D LVIDd:         4.60 cm LVIDs:         4.20 cm LV PW:         1.60 cm LV IVS:        1.70 cm  LEFT ATRIUM         Index LA diam:    3.90 cm 1.68 cm/m   AORTA Ao Root diam: 3.90 cm Ao Asc diam:  4.10 cm TRICUSPID VALVE TR Peak grad:   18.8 mmHg TR Vmax:        217.00 cm/s Fransico Him MD Electronically signed by Fransico Him MD Signature Date/Time: 03/22/2020/2:19:25 PM    Final      Medications:     Scheduled Medications: . sodium chloride   Intravenous  Once  . aspirin EC  325 mg Oral Daily   Or  . aspirin  324 mg Per Tube Daily   Or  . aspirin  300 mg Rectal Daily  . atorvastatin  80 mg Oral q1800  . bisacodyl  10 mg Oral Daily   Or  . bisacodyl  10 mg Rectal Daily  . Chlorhexidine Gluconate Cloth  6 each Topical Daily  . citalopram  20 mg Oral Daily  . colchicine  0.3 mg Oral BID  . docusate sodium  200 mg Oral Daily  . influenza vaccine adjuvanted  0.5 mL Intramuscular Tomorrow-1000  . mouth rinse  15 mL Mouth Rinse BID  . metoCLOPramide (REGLAN) injection  5 mg Intravenous Q8H  . metolazone  5 mg Oral Once  . pantoprazole  40 mg Oral Daily  . sildenafil  20 mg Oral TID  . sodium chloride flush  10-40 mL Intracatheter Q12H  . sodium chloride flush  3 mL Intravenous Q12H  . Warfarin - Pharmacist Dosing Inpatient   Does not apply q1600    Infusions: . sodium chloride Stopped (03/22/20 2011)  . sodium chloride    . sodium chloride    . sodium chloride 10 mL/hr at 03/23/20 0600  . amiodarone 60 mg/hr (03/23/20 0822)  . DOPamine Stopped (03/22/20 2318)  . epinephrine 1 mcg/min (03/23/20 0600)  . furosemide (LASIX) infusion 12 mg/hr (03/23/20 0600)  . insulin 2.6 mL/hr at 03/23/20 0600  . lactated ringers    . lactated ringers 20 mL/hr at 03/23/20 0600  . milrinone 0.375 mcg/kg/min (03/23/20 0823)  . norepinephrine (LEVOPHED) Adult infusion Stopped (03/21/20 1049)    PRN Medications: sodium chloride, acetaminophen, dextrose, hydrALAZINE, HYDROcodone-acetaminophen, lactated ringers, ondansetron (ZOFRAN) IV, ondansetron (ZOFRAN) IV, oxyCODONE, sodium chloride flush, sodium chloride flush, traMADol   Assessment/Plan:   1. Acute on chronic systolic HF -> cardiogenic shock-> S/p HM3 LVAD - Echo 2016 EF 30-35% - Echo 1/21 EF 25% - Admitted with NYHA IV symptoms and AKI with attempts at diuresis.  - Echo this admit: EF 10-15% moderate RV dysfunction - R/LHC  cath 3/21 with severe 3v CAD and low output with CI 1.7.  - Swan  placed. Initial co-ox 39%. Initial PAPi 1.5. Started on milrinone 0.25. - Initially planned for CABG but given need for re-do sternotomy, relatively poor targets and longstanding low EF, VAD felt to be better option  - S/p teeth extractions by Dr. Enrique Sack. - S/p HM3 VAD 4/8 - Currently on milrinone 0.375 + epinephrine 1 with MAP 80s, CI 2.3. Co-ox lower at 54%, will repeat. PaPI improved to 1.6 now that epinephrine restarted at low dose.  Speed turned up this morning to 5400, but looking at 4/10 echo there is significant RV dysfunction with leftward septal shift so speed decreased back to 5300 rpm.  - Continue sildenafil 20 mg tid.  - CVP elevated to 19 with creatinine rising.  Need to push diuresis today, increase Lasix to 15 mg/hr with metolazone 5 mg po x 1 today and replace K.  Follow creatinine closely.  - LDH stable 438.  - INR 3.2, adjust warfarin and decrease ASA to 81 daily.    2. CAD with unstable angina - s/p previous PCI - cath 02/16/2020 with severe 3v CAD - now s/p VAD  3. AKI on CKD 3a - Creatinine up to 2.88, worsening today.  May reflect RV failure.  PaPI better on epinephrine, will not increase speed today (decrease back to 5300 rpm).   - Volume overloaded so trying to push diuresis, lowering renal venous pressure may help renal function.  - Renal US 4/5 unremarkable   4. Permanent AF - now s/p MAZE + LAA Clipping 4/8 - remains in AF with mild RVR around 100 bpm.  - continue IV amiodarone, decrease to 30 mg/hr.  - On Eliquis at home. Switched to warfarin with VAD  5. 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 DM2, poorly controlled - hgbA1c 11.1% - continue insulin and SSI   7. ID - CXR with hazy opacity left base. WBCs up to 17.8.  - Send procalcitonin, will start empiric coverage for PNA with cefepime for now given tenuous situation.    CRITICAL CARE Performed by: Loralie Champagne  Total  critical care time: 40 minutes  Critical care time was exclusive of separately billable procedures and treating other patients.  Critical care was necessary to treat or prevent imminent or life-threatening deterioration.  Critical care was time spent personally by me (independent of midlevel providers or residents) on the following activities: development of treatment plan with patient and/or surrogate as well as nursing, discussions with consultants, evaluation of patient's response to treatment, examination of patient, obtaining history from patient or surrogate, ordering and performing treatments and interventions, ordering and review of laboratory studies, ordering and review of radiographic studies, pulse oximetry and re-evaluation of patient's condition.   Length of Stay: 11   Loralie Champagne MD 03/23/2020, 8:39 AM  Advanced Heart Failure Team Pager [phone] (M-F; 7a - 4p)  Please contact [facility] for night-coverage after hours (4p -7a ) and weekends on [email or web address]

## 2020-03-23 NOTE — Progress Notes (Signed)
Inpatient Diabetes Program Recommendations  AACE/ADA: New Consensus Statement on Inpatient Glycemic Control (2015)  Target Ranges:  Prepandial:   less than 140 mg/dL      Peak postprandial:   less than 180 mg/dL (1-2 hours)      Critically ill patients:  140 - 180 mg/dL   Lab Results  Component Value Date   GLUCAP 129 (H) 03/23/2020   HGBA1C 10.5 (H) 03/22/2020    Review of Glycemic Control   Diabetes history: DM 2 Outpatient Diabetes medications:  Basaglar 50 units daily, Novolog 6 units tid with meals Current orders for Inpatient glycemic control:  IV insulin Inpatient Diabetes Program Recommendations:   Recommend continuing IV insulin for today.  Drip rates 2-4.8 units/ hr.  Pt extubated 4/9. Upon transition, note home doses of insulin.  Likely will need both basal/meal coverage.   Thanks  Tama Headings RN, MSN, BC-ADM Inpatient Diabetes Coordinator Team Pager [phone] (8a-5p)

## 2020-03-24 ENCOUNTER — Encounter: Payer: Self-pay | Admitting: *Deleted

## 2020-03-24 ENCOUNTER — Inpatient Hospital Stay (HOSPITAL_COMMUNITY): Payer: Medicare HMO

## 2020-03-24 DIAGNOSIS — Z95811 Presence of heart assist device: Secondary | ICD-10-CM | POA: Diagnosis not present

## 2020-03-24 DIAGNOSIS — I255 Ischemic cardiomyopathy: Secondary | ICD-10-CM | POA: Diagnosis not present

## 2020-03-24 DIAGNOSIS — R570 Cardiogenic shock: Secondary | ICD-10-CM | POA: Diagnosis not present

## 2020-03-24 LAB — CBC WITH DIFFERENTIAL/PLATELET
Abs Immature Granulocytes: 0.2 10*3/uL — ABNORMAL HIGH (ref 0.00–0.07)
Basophils Absolute: 0 10*3/uL (ref 0.0–0.1)
Basophils Relative: 0 %
Eosinophils Absolute: 0 10*3/uL (ref 0.0–0.5)
Eosinophils Relative: 0 %
HCT: 30 % — ABNORMAL LOW (ref 39.0–52.0)
Hemoglobin: 9.9 g/dL — ABNORMAL LOW (ref 13.0–17.0)
Immature Granulocytes: 1 %
Lymphocytes Relative: 1 %
Lymphs Abs: 0.2 10*3/uL — ABNORMAL LOW (ref 0.7–4.0)
MCH: 30.3 pg (ref 26.0–34.0)
MCHC: 33 g/dL (ref 30.0–36.0)
MCV: 91.7 fL (ref 80.0–100.0)
Monocytes Absolute: 1.5 10*3/uL — ABNORMAL HIGH (ref 0.1–1.0)
Monocytes Relative: 9 %
Neutro Abs: 14.1 10*3/uL — ABNORMAL HIGH (ref 1.7–7.7)
Neutrophils Relative %: 89 %
Platelets: 158 10*3/uL (ref 150–400)
RBC: 3.27 MIL/uL — ABNORMAL LOW (ref 4.22–5.81)
RDW: 14.9 % (ref 11.5–15.5)
WBC: 16 10*3/uL — ABNORMAL HIGH (ref 4.0–10.5)
nRBC: 0.1 % (ref 0.0–0.2)

## 2020-03-24 LAB — POCT I-STAT 7, (LYTES, BLD GAS, ICA,H+H)
Acid-base deficit: 1 mmol/L (ref 0.0–2.0)
Bicarbonate: 23 mmol/L (ref 20.0–28.0)
Calcium, Ion: 1.12 mmol/L — ABNORMAL LOW (ref 1.15–1.40)
HCT: 29 % — ABNORMAL LOW (ref 39.0–52.0)
Hemoglobin: 9.9 g/dL — ABNORMAL LOW (ref 13.0–17.0)
O2 Saturation: 96 %
Potassium: 3.4 mmol/L — ABNORMAL LOW (ref 3.5–5.1)
Sodium: 134 mmol/L — ABNORMAL LOW (ref 135–145)
TCO2: 24 mmol/L (ref 22–32)
pCO2 arterial: 33.1 mmHg (ref 32.0–48.0)
pH, Arterial: 7.449 (ref 7.350–7.450)
pO2, Arterial: 77 mmHg — ABNORMAL LOW (ref 83.0–108.0)

## 2020-03-24 LAB — BASIC METABOLIC PANEL
Anion gap: 17 — ABNORMAL HIGH (ref 5–15)
BUN: 54 mg/dL — ABNORMAL HIGH (ref 8–23)
CO2: 20 mmol/L — ABNORMAL LOW (ref 22–32)
Calcium: 8.6 mg/dL — ABNORMAL LOW (ref 8.9–10.3)
Chloride: 97 mmol/L — ABNORMAL LOW (ref 98–111)
Creatinine, Ser: 3.57 mg/dL — ABNORMAL HIGH (ref 0.61–1.24)
GFR calc Af Amer: 19 mL/min — ABNORMAL LOW (ref >60)
GFR calc non Af Amer: 16 mL/min — ABNORMAL LOW (ref >60)
Glucose, Bld: 108 mg/dL — ABNORMAL HIGH (ref 70–99)
Potassium: 3.4 mmol/L — ABNORMAL LOW (ref 3.5–5.1)
Sodium: 134 mmol/L — ABNORMAL LOW (ref 135–145)

## 2020-03-24 LAB — PROTIME-INR
INR: 6.2 (ref 0.8–1.2)
INR: 6.2 (ref 0.8–1.2)
Prothrombin Time: 55.3 seconds — ABNORMAL HIGH (ref 11.4–15.2)
Prothrombin Time: 55.2 seconds — ABNORMAL HIGH (ref 11.4–15.2)

## 2020-03-24 LAB — URINALYSIS, ROUTINE W REFLEX MICROSCOPIC
Bilirubin Urine: NEGATIVE
Glucose, UA: NEGATIVE mg/dL
Ketones, ur: NEGATIVE mg/dL
Leukocytes,Ua: NEGATIVE
Nitrite: NEGATIVE
Protein, ur: 30 mg/dL — AB
RBC / HPF: >50 RBC/hpf — ABNORMAL HIGH (ref 0–5)
Specific Gravity, Urine: 1.015 (ref 1.005–1.030)
pH: 5 (ref 5.0–8.0)

## 2020-03-24 LAB — GLUCOSE, CAPILLARY
Glucose-Capillary: 122 mg/dL — ABNORMAL HIGH (ref 70–99)
Glucose-Capillary: 111 mg/dL — ABNORMAL HIGH (ref 70–99)
Glucose-Capillary: 118 mg/dL — ABNORMAL HIGH (ref 70–99)
Glucose-Capillary: 117 mg/dL — ABNORMAL HIGH (ref 70–99)
Glucose-Capillary: 113 mg/dL — ABNORMAL HIGH (ref 70–99)
Glucose-Capillary: 117 mg/dL — ABNORMAL HIGH (ref 70–99)
Glucose-Capillary: 120 mg/dL — ABNORMAL HIGH (ref 70–99)
Glucose-Capillary: 134 mg/dL — ABNORMAL HIGH (ref 70–99)
Glucose-Capillary: 112 mg/dL — ABNORMAL HIGH (ref 70–99)
Glucose-Capillary: 113 mg/dL — ABNORMAL HIGH (ref 70–99)
Glucose-Capillary: 118 mg/dL — ABNORMAL HIGH (ref 70–99)

## 2020-03-24 LAB — AMMONIA: Ammonia: 32 umol/L (ref 9–35)

## 2020-03-24 LAB — HEPATIC FUNCTION PANEL
ALT: 26 U/L (ref 0–44)
AST: 62 U/L — ABNORMAL HIGH (ref 15–41)
Albumin: 3 g/dL — ABNORMAL LOW (ref 3.5–5.0)
Alkaline Phosphatase: 72 U/L (ref 38–126)
Bilirubin, Direct: 0.8 mg/dL — ABNORMAL HIGH (ref 0.0–0.2)
Indirect Bilirubin: 0.7 mg/dL (ref 0.3–0.9)
Total Bilirubin: 1.5 mg/dL — ABNORMAL HIGH (ref 0.3–1.2)
Total Protein: 6.1 g/dL — ABNORMAL LOW (ref 6.5–8.1)

## 2020-03-24 LAB — COOXEMETRY PANEL
Carboxyhemoglobin: 1.1 % (ref 0.5–1.5)
Methemoglobin: 0.6 % (ref 0.0–1.5)
O2 Saturation: 57.3 %
Total hemoglobin: 9.9 g/dL — ABNORMAL LOW (ref 12.0–16.0)

## 2020-03-24 LAB — MAGNESIUM: Magnesium: 2.2 mg/dL (ref 1.7–2.4)

## 2020-03-24 LAB — LACTATE DEHYDROGENASE: LDH: 513 U/L — ABNORMAL HIGH (ref 98–192)

## 2020-03-24 LAB — PHOSPHORUS: Phosphorus: 5.6 mg/dL — ABNORMAL HIGH (ref 2.5–4.6)

## 2020-03-24 MED ORDER — ALBUTEROL SULFATE (2.5 MG/3ML) 0.083% IN NEBU: 2.5000 mg | INHALATION_SOLUTION | RESPIRATORY_TRACT | Status: DC | PRN

## 2020-03-24 MED ORDER — LEVALBUTEROL HCL 0.63 MG/3ML IN NEBU: 0.6300 mg | INHALATION_SOLUTION | Freq: Four times a day (QID) | RESPIRATORY_TRACT | Status: DC | PRN

## 2020-03-24 MED ORDER — VANCOMYCIN HCL IN DEXTROSE 1-5 GM/200ML-% IV SOLN
1000.0000 mg | Freq: Once | INTRAVENOUS | Status: AC
  Administered 2020-03-24: 1000 mg via INTRAVENOUS
  Filled 2020-03-24: qty 200

## 2020-03-24 MED ORDER — METOLAZONE 5 MG PO TABS
5.0000 mg | ORAL_TABLET | Freq: Once | ORAL | Status: AC
  Administered 2020-03-24: 10:00:00 5 mg via ORAL
  Filled 2020-03-24: qty 1

## 2020-03-24 MED ORDER — POTASSIUM CHLORIDE CRYS ER 20 MEQ PO TBCR
40.0000 meq | EXTENDED_RELEASE_TABLET | Freq: Once | ORAL | Status: AC
  Administered 2020-03-24: 10:00:00 40 meq via ORAL
  Filled 2020-03-24: qty 2

## 2020-03-24 MED ORDER — SODIUM CHLORIDE 0.9 % IV SOLN
2.0000 g | INTRAVENOUS | Status: DC
  Administered 2020-03-24: 20:00:00 2 g via INTRAVENOUS
  Filled 2020-03-24 (×2): qty 2

## 2020-03-24 MED ORDER — VANCOMYCIN HCL 1750 MG/350ML IV SOLN
1750.0000 mg | Freq: Once | INTRAVENOUS | Status: DC
  Administered 2020-03-24: 1750 mg via INTRAVENOUS
  Filled 2020-03-24: qty 350

## 2020-03-24 MED FILL — Heparin Sodium (Porcine) Inj 1000 Unit/ML: INTRAMUSCULAR | Qty: 10 | Status: AC

## 2020-03-24 MED FILL — Sodium Chloride IV Soln 0.9%: INTRAVENOUS | Qty: 2000 | Status: AC

## 2020-03-24 MED FILL — Sodium Bicarbonate IV Soln 8.4%: INTRAVENOUS | Qty: 50 | Status: AC

## 2020-03-24 MED FILL — Electrolyte-R (PH 7.4) Solution: INTRAVENOUS | Qty: 4000 | Status: AC

## 2020-03-24 MED FILL — Calcium Chloride Inj 10%: INTRAVENOUS | Qty: 10 | Status: AC

## 2020-03-24 MED FILL — Mannitol IV Soln 20%: INTRAVENOUS | Qty: 500 | Status: AC

## 2020-03-24 NOTE — Progress Notes (Signed)
LVAD Coordinator Rounding Note:  HM III LVAD implanted on 04/07/2020 by Dr Orvan Seen under destination therapy criteria.  Pt lying in bed, difficult to arouse at times. Pt denies pain.   Vital signs: HR: 114 afib Doppler Pressure: 124 Art line: 116/78 (88)  Automatic BP: 106/91(98) O2 Sat: 94%  Wt: 253.3>262.3 lbs   LVAD interrogation reveals:  Speed: 5300 Flow: 4.4 Power: 3.8 PI: 3.6  Alarms: none Events:  none Hematocrit: 29  Fixed speed: 5100 Low speed limit: 4900   Drive Line: Existing VAD dressing removed and site care performed using sterile technique. Drive line exit site cleaned with Chlora prep applicators x 2, allowed to dry, and guaze dressing with silver strip re-applied. Exit site unincorporated, the velour is fully implanted at exit site. 2 sutures intact. No drainage, redness, tenderness, foul odor or rash noted. Drive line anchor re-applied. Daily dressing change per nurse champion or VAD coordinator. Dressing change due 03/25/20.     Labs:  LDH trend: 360>513  INR trend: 1.3>6.2  CR: 3.57  Anticoagulation Plan: -INR Goal: 2.0-2.5 -ASA Dose: 81 mg   Blood Products:  Intraop: 6 FFP  DDAVP  420 cell saver  04/08/2020>>4 FFP 03/21/20>> 1 RBC 03/22/20>>1 RBC  Device: -St [name] -Therapies: VF 194  Drips: Amiodarone 30 mg/hr Epinephrine 1 mcg/min Lasix 15 mg/hr Insulin 2.8 U/HR Milrinone 0.375 mcg/kg/min Levophed-- off  Respiratory:  on nitric 5 ppm  Pt Education:  1. Pt is difficult to arouse at this time. Education inappropriate at this time.  2. No family at bedside    Plan/Recommendations:  1. Call VAD coordinator for any equipment or drive line issues.  2. Daily drive line dressing change per nurse champion or VAD coordinator.   Emerson Monte RN [place] Coordinator  Office: [phone]  24/7 Pager: [phone]

## 2020-03-24 NOTE — Progress Notes (Signed)
4 Days Post-Op Procedure(s) (LRB): REDO STERNOTOMY WITH STERNAL PLATING (N/A) INSERTION OF IMPLANTABLE LEFT VENTRICULAR ASSIST DEVICE - HM3 (N/A) MAZE (N/A) TRANSESOPHAGEAL ECHOCARDIOGRAM (TEE) (N/A) Clipping Of Atrial Appendage (Left) Subjective: No complaints  Objective: Vital signs in last 24 hours: Temp:  [96.4 F (35.8 C)-98.8 F (37.1 C)] 97.2 F (36.2 C) (04/12 0800) Pulse Rate:  [91-143] 111 (04/12 0800) Cardiac Rhythm: Atrial fibrillation (04/12 0400) Resp:  [10-28] 14 (04/12 0800) BP: (104-141)/(73-102) 106/91 (04/12 0800) SpO2:  [86 %-96 %] 94 % (04/12 0800) Arterial Line BP: (97-148)/(66-87) 116/78 (04/12 0800) Weight:  [937 kg] 119 kg (04/12 0345)  Hemodynamic parameters for last 24 hours: PAP: (47-69)/(9-43) 58/30 CVP:  [15 mmHg-33 mmHg] 24 mmHg CO:  [5.7 L/min-7.2 L/min] 5.7 L/min CI:  [2.6 L/min/m2-3.2 L/min/m2] 2.6 L/min/m2  Intake/Output from previous day: 04/11 0701 - 04/12 0700 In: 2175.7 [P.O.:240; I.V.:1835.6; IV Piggyback:100.1] Out: 2970 [Urine:2310; Chest Tube:660] Intake/Output this shift: No intake/output data recorded.  General appearance: slowed mentation Neurologic: intact Heart: irregularly irregular rhythm Extremities: edema 2+ Wound: dressed, dry  Lab Results: Recent Labs    03/23/20 0335 03/23/20 0335 03/24/20 0343 03/24/20 0404  WBC 17.8*  --  16.0*  --   HGB 10.5*   < > 9.9* 9.9*  HCT 31.7*   < > 30.0* 29.0*  PLT 139*  --  158  --    < > = values in this interval not displayed.   BMET:  Recent Labs    03/23/20 1523 03/23/20 1523 03/24/20 0343 03/24/20 0404  NA 135   < > 134* 134*  K 3.4*   < > 3.4* 3.4*  CL 99  --  97*  --   CO2 20*  --  20*  --   GLUCOSE 118*  --  108*  --   BUN 45*  --  54*  --   CREATININE 3.20*  --  3.57*  --   CALCIUM 8.4*  --  8.6*  --    < > = values in this interval not displayed.    PT/INR:  Recent Labs    03/24/20 0452  LABPROT 55.2*  INR 6.2*   ABG    Component Value  Date/Time   PHART 7.449 03/24/2020 0404   HCO3 23.0 03/24/2020 0404   TCO2 24 03/24/2020 0404   ACIDBASEDEF 1.0 03/24/2020 0404   O2SAT 57.3 03/24/2020 0440   CBG (last 3)  Recent Labs    03/24/20 0121 03/24/20 0337 03/24/20 0641  GLUCAP 122* 111* 118*    Assessment/Plan: S/P Procedure(s) (LRB): REDO STERNOTOMY WITH STERNAL PLATING (N/A) INSERTION OF IMPLANTABLE LEFT VENTRICULAR ASSIST DEVICE - HM3 (N/A) MAZE (N/A) TRANSESOPHAGEAL ECHOCARDIOGRAM (TEE) (N/A) Clipping Of Atrial Appendage (Left) Mobilize Diuresis check ammonia  Leave chest tubes Would not aggressively reverse INR yet Consult renal.    LOS: 12 days    Wonda Olds 03/24/2020

## 2020-03-24 NOTE — Progress Notes (Signed)
Patient ID: [name] [name], male   DOB: [birth year], 69 y.o.   MRN: [id number] [facility] Evening Rounds:  Hemodynamics stable on epi 1, milrinone 0.375. LVAD parameters stable. MAP 80, CVP 17.  Urine output marginal considering lasix drip 15/hr. Creat this am increased to 3.57.   Has been lethargic and weak but responds. No pain meds today. ABG ok this am with normal PCO2  Ammonia 32. AST 62, ALT 26, TB 1.5  INR increased to 6.2. Same on repeat. Coumadin on hold. No signs of bleeding.   Needs nephrology consult and consideration of CRRT if renal function doesn't turn around with CVP 17.

## 2020-03-24 NOTE — Progress Notes (Signed)
PT Cancellation Note  Patient Details Name: MEKAI WILKINSON MRN: [id number] DOB: 01/29/1951   Cancelled Treatment:    Reason Eval/Treat Not Completed: Medical issues which prohibited therapy(pt with INR 6.2)   Jasiah Elsen B Lanard Arguijo 03/24/2020, 8:34 AM Bayard Males, PT Acute Rehabilitation Services Pager: [phone] Office: [phone]

## 2020-03-24 NOTE — Progress Notes (Addendum)
Patient ID: [name] [name], male   DOB: 1951/09/03, 69 y.o.   MRN: [id number]    Advanced Heart Failure Rounding Note   Subjective:    - S/p HM3 VAD 4/8 w MAZE procedure + LAA clipping. - Extubated 4/9  LVAD speed increased to 5400 earlier --> echo moderate-severe RV dysfunction, septum mildly left shifted, trivial pericardial effusion.  LVAD speed cut back to 5300.   He is currently on milrinone 0.375, epinephrine 1, Lasix gtt at 15, NO 5 ppm, amiodarone 30 mg/hr.  HR 110-120 in atrial fibrillation.   Improved urine output. Creatinine trending up 1.95 => 2.23 => 2.88 => 3.2 .   Swan #s PA 55/25 CVP 18  Papi 1.6  Thermo CI  2.6  Co-ox 57%  LDH 421 => 438=>513  tbili 2.2 => 2=> 1.5. AST/ALT normal   Drwosy but denies.    VAD Interrogation  Flow 4.4, Speed 5300, Power 4, Pulse Index 3.3. No PI events   Objective:   Weight Range:  Vital Signs:   Temp:  [96.4 F (35.8 C)-98.8 F (37.1 C)] 96.8 F (36 C) (04/12 0700) Pulse Rate:  [91-143] 105 (04/12 0700) Resp:  [10-28] 12 (04/12 0700) BP: (104-141)/(73-102) 141/86 (04/12 0503) SpO2:  [86 %-96 %] 93 % (04/12 0700) Arterial Line BP: (97-148)/(66-87) 109/77 (04/12 0700) Weight:  [993 kg] 119 kg (04/12 0345) Last BM Date: 04/04/2020  Weight change: Filed Weights   03/22/20 0500 03/23/20 0336 03/24/20 0345  Weight: 111.7 kg 118.3 kg 119 kg    Intake/Output:   Intake/Output Summary (Last 24 hours) at 03/24/2020 0751 Last data filed at 03/24/2020 0700 Gross per 24 hour  Intake 2175.7 ml  Output 2970 ml  Net -794.3 ml    Map 80s   CVP 18-19  Physical Exam: GENERAL: Drowsy  HEENT: normal  NECK: Supple, JVP elevated .  2+ bilaterally, no bruits.  No lymphadenopathy or thyromegaly appreciated.  RIJ  CARDIAC:  Mechanical heart sounds with LVAD hum present.  LUNGS: R --> IW/EW on 4 liters  ABDOMEN:  Soft, round, nontender, positive bowel sounds x4.     LVAD exit site:   Dressing dry and intact.  No erythema or  drainage.  Stabilization device present and accurately applied.  Driveline dressing is being changed daily per sterile technique. EXTREMITIES:  Warm and dry, no cyanosis, clubbing, rash or edema  NEUROLOGIC:  Drowsy but follows commands. Oriented x3. MAE x4  Affect flat   Telemetry: AF 100-120s  Personally reviewed    Labs: Basic Metabolic Panel: Recent Labs  Lab 03/21/20 0209 03/21/20 0222 03/21/20 1452 03/21/20 1459 03/22/20 0307 03/22/20 0413 03/22/20 1454 03/22/20 1454 03/23/20 0326 03/23/20 0335 03/23/20 1523 03/24/20 0343 03/24/20 0404  NA 137   < > 138   < > 136   < > 136   < > 134* 134* 135 134* 134*  K 4.2   < > 4.1   < > 3.6   < > 3.7   < > 3.4* 3.5 3.4* 3.4* 3.4*  CL 102   < > 104   < > 101  --  102  --   --  98 99 97*  --   CO2 22   < > 22   < > 21*  --  21*  --   --  21* 20* 20*  --   GLUCOSE 184*   < > 155*   < > 145*  --  134*  --   --  116* 118* 108*  --   BUN 21   < > 25*   < > 30*  --  36*  --   --  40* 45* 54*  --   CREATININE 1.68*   < > 1.95*   < > 2.23*  --  2.60*  --   --  2.88* 3.20* 3.57*  --   CALCIUM 8.8*   < > 8.6*   < > 8.5*   < > 9.0   < >  --  8.9 8.4* 8.6*  --   MG 2.2  --  2.1  --  2.1  --   --   --   --  2.1  --  2.2  --   PHOS 3.9  --   --   --  4.9*  --   --   --   --  5.7*  --  5.6*  --    < > = values in this interval not displayed.    Liver Function Tests: Recent Labs  Lab 03/21/20 0209 03/22/20 0307 03/22/20 1454 03/23/20 0335 03/23/20 1523  AST 92* 99* 90* 70* 64*  ALT 20 26 27 24 24   ALKPHOS 54 59 67 78 74  BILITOT 1.7* 2.2* 2.4* 2.0* 2.2*  PROT 5.4* 5.6* 6.0* 5.9* 5.9*  ALBUMIN 3.4* 3.0* 3.2* 3.0* 2.9*   No results for input(s): LIPASE, AMYLASE in the last 168 hours. No results for input(s): AMMONIA in the last 168 hours.  CBC: Recent Labs  Lab 03/21/20 0209 03/21/20 0222 03/21/20 1452 03/21/20 1459 03/22/20 0307 03/22/20 0307 03/22/20 0413 03/23/20 0326 03/23/20 0335 03/24/20 0343 03/24/20 0404  WBC  14.5*  --  14.0*  --  12.0*  --   --   --  17.8* 16.0*  --   NEUTROABS 13.1*  --   --   --  10.8*  --   --   --  16.0* 14.1*  --   HGB 9.5*   < > 8.7*   < > 9.2*   < > 9.2* 10.5* 10.5* 9.9* 9.9*  HCT 28.6*   < > 27.0*   < > 28.6*   < > 27.0* 31.0* 31.7* 30.0* 29.0*  MCV 93.2  --  93.1  --  93.5  --   --   --  92.2 91.7  --   PLT 109*  --  102*  --  92*  --   --   --  139* 158  --    < > = values in this interval not displayed.    Cardiac Enzymes: No results for input(s): CKTOTAL, CKMB, CKMBINDEX, TROPONINI in the last 168 hours.  BNP: BNP (last 3 results) Recent Labs    01/04/20 1642 03/21/20 0209  BNP 165.2* 315.9*    ProBNP (last 3 results) No results for input(s): PROBNP in the last 8760 hours.    Other results:  Imaging: DG Chest 1 View  Result Date: 03/22/2020 CLINICAL DATA:  Open heart surgery. EXAM: CHEST  1 VIEW COMPARISON:  Radiograph 03/21/2020 FINDINGS: LEFT-sided pacemaker and LVAD device noted. Swan-Ganz catheter with tip in the main pulmonary artery. RIGHT chest tube in place without pneumothorax. Interval extubation Cardiomegaly with mild central venous congestion. No overt pulmonary edema. Probable LEFT effusion. No pneumothorax. IMPRESSION: 1. Interval extubation without complication. 2. Stable additional support apparatus. 3. Cardiomegaly and central venous congestion with LEFT basilar atelectasis and small effusion. Electronically Signed   By: Helane Gunther.D.  On: 03/22/2020 08:39   DG Chest Port 1 View  Result Date: 03/23/2020 CLINICAL DATA:  Post open heart surgery. EXAM: PORTABLE CHEST 1 VIEW COMPARISON:  03/22/2020 FINDINGS: Left ventricular assist device and left-sided cardiac pacemaker unchanged. Right IJ Swan-Ganz catheter with tip over the main pulmonary artery segment unchanged. Catheter from below courses over the right heart with projecting in the region of the main pulmonary artery segment unchanged. Right-sided chest tube unchanged. Lungs are  adequately inflated demonstrate mild worsening opacification over the right base which may be due to atelectasis or infection. Slight worsening opacification in the left retrocardiac region which may be due to effusion/atelectasis. Mild stable prominence of the perihilar vessels suggesting mild vascular congestion. Stable cardiomegaly. Remainder the exam is unchanged. IMPRESSION: 1. Slight worsening hazy opacification in the left base/retrocardiac region as well as right base. Findings may be due to atelectasis/effusion versus infection. 2.  Support apparatus as described. Electronically Signed   By: Marin Olp M.D.   On: 03/23/2020 08:23   ECHOCARDIOGRAM LIMITED  Result Date: 03/22/2020    ECHOCARDIOGRAM LIMITED REPORT   Patient Name:   [name] [name] [facility] Date of Exam: 03/22/2020 Medical Rec #:  [id number]           Height:       72.0 in Accession #:    [id number]          Weight:       246.3 lb Date of Birth:  1951-09-03           BSA:          2.328 m Patient Age:    29 years            BP:           93/67 mmHg Patient Gender: M                   HR:           113 bpm. Exam Location:  Inpatient Procedure: Limited Echo and Color Doppler Indications:    I31.3 (Rule out)Pericardial effusion (noninflammatory)  History:        Patient has prior history of Echocardiogram examinations, most                 recent 03/13/2020. LV DysFx, CAD, Abnormal ECG and Defibrillator,                 Mitral Valve Disease, Arrythmias:Atrial Fibrillation;                 Signs/Symptoms:Chest Pain. Cardiac shock. LVAD. Elevated                 troponin.  Sonographer:    Roseanna Rainbow RDCS Referring Phys: [place] Comments: Patient is morbidly obese and Technically difficult study due to poor echo windows. Image acquisition challenging due to patient body habitus and LVAD. Only limited off axis views due to LVAD and wound dressings. IMPRESSIONS  1. Images are poor quality and cannot comment on global LV function.  . Left ventricular ejection fraction, by estimation, is 20 to 25%. The left ventricle has severely decreased function. The left ventricle demonstrates global hypokinesis. There is moderate concentric left ventricular hypertrophy.  2. Aortic dilatation noted. There is mild dilatation of the ascending aorta and of the aortic root measuring 41 mm and 45mm respectively.  3. Right ventricular systolic function is severely reduced. The right ventricular size is moderately enlarged.  4.  The aortic valve is tricuspid. Mild aortic valve sclerosis is present, with no evidence of aortic valve stenosis. FINDINGS  Left Ventricle: Images are poor quality and cannot comment on global LV function. Left ventricular ejection fraction, by estimation, is 20 to 25%. The left ventricle has severely decreased function. The left ventricle demonstrates global hypokinesis. There is moderate concentric left ventricular hypertrophy. Right Ventricle: The right ventricular size is moderately enlarged. Right ventricular systolic function is severely reduced. Left Atrium: Left atrial size was not well visualized. Right Atrium: Right atrial size was not well visualized. Tricuspid Valve: Tricuspid valve regurgitation is mild. Aortic Valve: The aortic valve is tricuspid. Mild aortic valve sclerosis is present, with no evidence of aortic valve stenosis. Pulmonic Valve: The pulmonic valve was not well visualized. Aorta: Aortic dilatation noted. There is mild dilatation of the ascending aorta and of the aortic root measuring 41 mm. Additional Comments: A pacer wire is visualized.  LEFT VENTRICLE PLAX 2D LVIDd:         4.60 cm LVIDs:         4.20 cm LV PW:         1.60 cm LV IVS:        1.70 cm  LEFT ATRIUM         Index LA diam:    3.90 cm 1.68 cm/m   AORTA Ao Root diam: 3.90 cm Ao Asc diam:  4.10 cm TRICUSPID VALVE TR Peak grad:   18.8 mmHg TR Vmax:        217.00 cm/s Fransico Him MD Electronically signed by Fransico Him MD Signature Date/Time:  03/22/2020/2:19:25 PM    Final      Medications:     Scheduled Medications: . sodium chloride   Intravenous Once  . aspirin  81 mg Oral Daily  . atorvastatin  80 mg Oral q1800  . bisacodyl  10 mg Oral Daily   Or  . bisacodyl  10 mg Rectal Daily  . Chlorhexidine Gluconate Cloth  6 each Topical Daily  . citalopram  20 mg Oral Daily  . colchicine  0.3 mg Oral BID  . docusate sodium  200 mg Oral Daily  . influenza vaccine adjuvanted  0.5 mL Intramuscular Tomorrow-1000  . mouth rinse  15 mL Mouth Rinse BID  . pantoprazole  40 mg Oral Daily  . sildenafil  20 mg Oral TID  . sodium chloride flush  10-40 mL Intracatheter Q12H  . sodium chloride flush  3 mL Intravenous Q12H  . Warfarin - Pharmacist Dosing Inpatient   Does not apply q1600    Infusions: . sodium chloride Stopped (03/22/20 2011)  . sodium chloride    . sodium chloride    . sodium chloride Stopped (03/23/20 1052)  . amiodarone 30 mg/hr (03/24/20 0700)  . ceFEPime (MAXIPIME) IV 2 g (03/23/20 2037)  . DOPamine Stopped (03/22/20 2318)  . epinephrine 1 mcg/min (03/24/20 0700)  . furosemide (LASIX) infusion 15 mg/hr (03/24/20 0700)  . insulin 3 mL/hr at 03/24/20 0700  . lactated ringers    . lactated ringers 20 mL/hr at 03/24/20 0700  . milrinone 0.375 mcg/kg/min (03/24/20 0700)  . norepinephrine (LEVOPHED) Adult infusion Stopped (03/21/20 1049)    PRN Medications: sodium chloride, acetaminophen, dextrose, hydrALAZINE, HYDROcodone-acetaminophen, lactated ringers, ondansetron (ZOFRAN) IV, ondansetron (ZOFRAN) IV, oxyCODONE, sodium chloride flush, sodium chloride flush, traMADol   Assessment/Plan:   1. Acute on chronic systolic HF -> cardiogenic shock-> S/p HM3 LVAD - Echo 2016 EF 30-35% - Echo 1/21 EF  25% - Admitted with NYHA IV symptoms and AKI with attempts at diuresis.  - Echo this admit: EF 10-15% moderate RV dysfunction - R/LHC cath 3/21 with severe 3v CAD and low output with CI 1.7.  - Swan placed. Initial  co-ox 39%. Initial PAPi 1.5. Started on milrinone 0.25. - Initially planned for CABG but given need for re-do sternotomy, relatively poor targets and longstanding low EF, VAD felt to be better option  - S/p teeth extractions by Dr. Enrique Sack. - S/p HM3 VAD 4/8 - Currently on milrinone 0.375 + epinephrine 1 with MAP 80s, CI 2.55. Co-ox lower at 57% - Speed turned up on 4/11to  5400, but looking at 4/10 echo there is significant RV dysfunction with leftward septal shift so speed decreased back to 5300 rpm.  - Continue sildenafil 20 mg tid.  - CVP 18. Continue  Lasix to 15 mg/hr and give another 5 mg metoalzone x1.   - LDH stable 513  - INR 6.2   - Continue ASA to 81 daily.    2. CAD with unstable angina - s/p previous PCI - cath 03/09/2020 with severe 3v CAD - now s/p VAD  3. AKI on CKD 3a - Creatinine continue to worsen -->3.2   - May reflect RV failure.  PaPI better on epinephrine, will not increase speed today (decrease back to 5300 rpm).   - Renal US 4/5 unremarkable   4. Permanent AF - now s/p MAZE + LAA Clipping 4/8 - Remains in A fib RVR 110- 120s. Given 150 mg bolus.  - continue IV amiodarone, 30 mg/hr.  -Switched to warfarin with VAD  5. 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 DM2, poorly controlled - hgbA1c 11.1% - continue insulin and SSI   7. ID - CXR worsening airspace R>L . Adding xopenex.  Needs IS.  - WBCs 16.  - 4/11 procalcitonin,  1.96  - Started empiric coverage for PNA with cefepime for now given tenuous situation.   - Urine culture obtained 03/24/20   Length of Stay: [place] NP_C 03/24/2020, 7:51 AM  Advanced Heart Failure Team Pager [phone] (M-F; 7a - 4p)  Please contact [facility] for night-coverage after hours (4p -7a ) and weekends on [email or web address]  Patient seen with NP, agree with the above note.   Drowsy this morning but will follow commands.  UOP was better  yesterday with Lasix gtt + metolazone, but still volume overloaded with CVP 18.  CI 2.6.  Creatinine rising, up to 3.2 today.  AST/ALT normal and tbili decreasing (1.5) but INR up to 6.2.  LVAD parameters stable, no alarms.    CXR with diffuse airspace disease R>L.  Currently on cefepime, PCT 1.96.   General: NAD, drowsy HEENT: Normal. Neck: Supple, JVP 16 cm. Carotids OK.  Cardiac:  Mechanical heart sounds with LVAD hum present.  Lungs:  CTAB, normal effort.  Abdomen:  NT, ND, no HSM. No bruits or masses. +BS  LVAD exit site: Well-healed and incorporated. Dressing dry and intact. No erythema or drainage. Stabilization device present and accurately applied. Driveline dressing changed daily per sterile technique. Extremities:  Warm and dry. No cyanosis, clubbing, rash. 1+ edema to knees.  Neuro:  Drowsy but awakens and follows commands   Creatinine rising despite stable MAP and good CI (2.6).  ?Ischemic event at some point earlier in hospital stay with ATN.  He  is volume overloaded with diffuse bilateral airspace disease on CXR. UOP has picked up some. Stable LVAD parameters.  - Continue Lasix gtt 15 mg/hr and will give metolazone 5 mg po x 1 again today.  - Continue epinephrine for RV support. - Continue milrinone.  - Will keep speed at 5300, decreased from 5400 with left-shifted septum and RV failure.   Bilateral airspace disease, WBCs 16.  ?Component of PNA.  - Continue cefepime and will add vancomycin dosed for renal function to cover more broadly.  - With gallstones pre-op, will get RUQ [place] to rule out cholecystitis.   Drowsy, mildly confused.  Will send NH3.  AST/ALT normal and tbili decreasing at 1.5, which is surprising given concern for RV failure.     CRITICAL CARE Performed by: Loralie Champagne  Total critical care time: 40 minutes  Critical care time was exclusive of separately billable procedures and treating other patients.  Critical care was necessary to treat or prevent  imminent or life-threatening deterioration.  Critical care was time spent personally by me on the following activities: development of treatment plan with patient and/or surrogate as well as nursing, discussions with consultants, evaluation of patient's response to treatment, examination of patient, obtaining history from patient or surrogate, ordering and performing treatments and interventions, ordering and review of laboratory studies, ordering and review of radiographic studies, pulse oximetry and re-evaluation of patient's condition.  Loralie Champagne 03/24/2020 9:27 AM

## 2020-03-24 NOTE — Progress Notes (Signed)
CRITICAL VALUE ALERT  Critical Value:  6.2 INR  Date & Time Notied:  03/24/20 1947  Provider Notified: Fredrich Romans MD  Orders Received/Actions taken: Paged MD regarding pt's INR, potassium, and increased creatinine. MD noted, reports coumadin will need to be held today. This RN informed MD pt's heart rate continues AFIB in the low 100s throughout the night. No further orders at this time.

## 2020-03-24 NOTE — Plan of Care (Signed)
Pt is alert and oriented, fatigued, moans and groans often. When questioned about needs, pt responds, "I don't know.". Pt nods when offered to turn, water, mouth care, head adjustment, or any other RN suggestion. Pt readjusts oxygen from nose and states, "it's killing me." Pt was reassured, reeducated, and redirected often throughout the night. Moisturizer from oral kit placed on nose and foam applied to ear tubing to prevent breakdown. Pt is able to move all extremities, reaches out for call bell, and is able to drink water from a cup independently. Pt needs a lot of encouragement to do breathing exercises and frequent reminders for splinting. No PI events on LVAD. No bleeding noticed. PRN medication given for hypertension. See MAR. Problem: Activity: Goal: Ability to return to baseline activity level will improve Outcome: Progressing   Problem: Cardiovascular: Goal: Ability to achieve and maintain adequate cardiovascular perfusion will improve Outcome: Progressing   Problem: Clinical Measurements: Goal: Diagnostic test results will improve Outcome: Not Progressing   Problem: Safety: Goal: Ability to remain free from injury will improve Outcome: Progressing

## 2020-03-24 NOTE — Progress Notes (Addendum)
Pharmacy Antibiotic Note  [name] [name] is a 69 y.o. male admitted on 02/15/2020 now s/p LVAD HMIII implant. WBC increasing to 17.8>16.  Pharmacy has been consulted for empiric cefepime dosing.  Now asked to add IV vancomycin  Scr up to 3.5 today.  Plan: Adjust cefepime to 2g q 24 hrs. Add vancomycin 1750 mg IV x 1 now, will need to dose based on levels given renal dysfunction. Check random vancomycin level tomorrow AM. Monitor cultures, renal function, and LOT  Height: 6' (182.9 cm) Weight: 119 kg (262 lb 5.6 oz)(bed scale) IBW/kg (Calculated) : 77.6  Temp (24hrs), Avg:97.5 F (36.4 C), Min:96.4 F (35.8 C), Max:98.8 F (37.1 C)  Recent Labs  Lab 03/21/20 0209 03/21/20 0209 03/21/20 1452 03/21/20 1452 03/22/20 0307 03/22/20 1454 03/23/20 0335 03/23/20 1523 03/24/20 0343  WBC 14.5*  --  14.0*  --  12.0*  --  17.8*  --  16.0*  CREATININE 1.68*   < > 1.95*   < > 2.23* 2.60* 2.88* 3.20* 3.57*   < > = values in this interval not displayed.    Estimated Creatinine Clearance: 25.7 mL/min (A) (by C-G formula based on SCr of 3.57 mg/dL (H)).    Allergies  Allergen Reactions  . Liraglutide Nausea And Vomiting  . Lisinopril Cough    Antimicrobials this admission: Periop abx 4/8>>4/10 Cefepime 4/11 >>  Dose adjustments this admission: None  Microbiology results: 4/7 MRSA PCR: neg  Thank you for allowing pharmacy to be a part of this patient's care.  [place] Crane, Roylene Reason, [facility] Clinical Pharmacist Phone [phone]  03/24/2020 8:16 AM   Please check [email or web address] for unit-specific pharmacist phone numbers

## 2020-03-24 NOTE — Progress Notes (Signed)
IV line became disconnected from Vancomycin IVPB resulting in approximately 100 mL of medication spilling on to the floor.  Per the IV pump, patient had received 130 mL of Vancomycin.  Spoke with pharmacy, stopped current vancomycin IVPB and new orders received to ensure patient receives correct amount of medication.

## 2020-03-24 NOTE — Progress Notes (Signed)
ANTICOAGULATION CONSULT NOTE  Pharmacy Consult for warfarin Indication: post op LVAD  / Aflutter  Allergies  Allergen Reactions  . Liraglutide Nausea And Vomiting  . Lisinopril Cough    Patient Measurements: Height: 6' (182.9 cm) Weight: 119 kg (262 lb 5.6 oz)(bed scale) IBW/kg (Calculated) : 77.6 Heparin Dosing Weight: 101.2 kg  Vital Signs: Temp: 96.8 F (36 C) (04/12 0700) Temp Source: Core (04/12 0400) BP: 141/86 (04/12 0503) Pulse Rate: 105 (04/12 0700)  Labs: Recent Labs    03/22/20 0307 03/22/20 0413 03/23/20 0335 03/23/20 0335 03/23/20 1523 03/24/20 0343 03/24/20 0404 03/24/20 0452  HGB 9.2*   < > 10.5*   < >  --  9.9* 9.9*  --   HCT 28.6*   < > 31.7*  --   --  30.0* 29.0*  --   PLT 92*  --  139*  --   --  158  --   --   LABPROT 17.8*   < > 32.5*  --   --  55.3*  --  55.2*  INR 1.5*   < > 3.2*  --   --  6.2*  --  6.2*  CREATININE 2.23*   < > 2.88*  --  3.20* 3.57*  --   --    < > = values in this interval not displayed.    Estimated Creatinine Clearance: 25.7 mL/min (A) (by C-G formula based on SCr of 3.57 mg/dL (H)).   Medical History: Past Medical History:  Diagnosis Date  . Anxiety   . Arthritis   . CAD (coronary artery disease)    a.  1993 s/p MI - [employer];  b. s/p BMS to LAD '00;  c. PTCA 2nd diagonal 2010;  d. 02/18/12 Cath: moderate nonobs dzs - med rx;  e.  01/2015 Cath: LM nl, LAD 40-55m ISR, 70-79m/d, d1 90p (3.0x16 Synergy DES), D2 50-60, LCX nl, OM1 50p, 33m (2.5x12 Synergy DES), RCA nl, EF 30-35%.  . Chronic combined systolic and diastolic CHF (congestive heart failure) ([place])    a. 12/2014 Echo: EF 30-35%, Gr2 DD, mod MR, sev dil LA.  . CKD (chronic kidney disease), stage III   . Depression   . ED (erectile dysfunction)   . GERD (gastroesophageal reflux disease)   . Hyperlipidemia   . Hypertension   . Ischemic cardiomyopathy    a. s/p St. [name] (Atlas) ICD implanted in [place] 2007;  b. 12/2014 Echo: Ef 30-35%.  . MVP (mitral  valve prolapse)    a. s/p MV annuloplasty at [facility] 2004.  Marland Kitchen Persistent atrial fibrillation ([place])    a. noted on ICD interrogation '10 - not previously on [place] - CHA2DS2VASc = 5.  . Type II diabetes mellitus ([place])    uncontrolled    Medications:  Medications Prior to Admission  Medication Sig Dispense Refill Last Dose  . ALPRAZolam (XANAX) 0.25 MG tablet Take 0.25 mg by mouth 3 (three) times daily as needed for anxiety.    unknown  . amiodarone (PACERONE) 200 MG tablet Take 200 mg by mouth daily.    03/11/2020 at Unknown time  . apixaban (ELIQUIS) 5 MG TABS tablet Take 1 tablet (5 mg total) by mouth 2 (two) times daily. 60 tablet 1 03/11/2020 at 0600  . atorvastatin (LIPITOR) 40 MG tablet Take 1 tablet (40 mg total) by mouth daily at 6 PM. 30 tablet 0 03/10/2020 at Unknown time  . citalopram (CELEXA) 20 MG tablet Take 20 mg by mouth daily.  03/11/2020 at Unknown time  . furosemide (LASIX) 40 MG tablet Take 40 mg by mouth daily.    03/11/2020 at Unknown time  . insulin aspart (NOVOLOG FLEXPEN) 100 UNIT/ML FlexPen Inject 6 Units into the skin 3 (three) times daily with meals. 15 mL 0 03/11/2020 at Unknown time  . Insulin Glargine (BASAGLAR KWIKPEN) 100 UNIT/ML SOPN Inject 0.5 mLs (50 Units total) into the skin daily. 15 mL 1 03/11/2020 at Unknown time  . metoprolol succinate (TOPROL-XL) 100 MG 24 hr tablet Take 1 tablet (100 mg total) by mouth daily. 90 tablet 3 03/11/2020 at 0600  . nitroGLYCERIN (NITROSTAT) 0.4 MG SL tablet Place 1 tablet (0.4 mg total) under the tongue every 5 (five) minutes x 3 doses as needed for chest pain. 10 tablet 0 unknown  . pantoprazole (PROTONIX) 40 MG tablet Take 1 tablet (40 mg total) by mouth daily. 30 tablet 1 03/11/2020 at Unknown time  . potassium chloride SA (KLOR-CON) 20 MEQ tablet Take 20 mEq by mouth daily.    03/11/2020 at Unknown time  . tamsulosin (FLOMAX) 0.4 MG CAPS capsule Take 1 capsule (0.4 mg total) by mouth daily. 30 capsule 0 03/11/2020 at Unknown  time  . ALPRAZolam (XANAX) 1 MG tablet Take 1 tablet (1 mg total) by mouth 2 (two) times daily as needed for anxiety. (Patient not taking: Reported on 02/12/2020) 10 tablet 0 Not Taking at Unknown time    Assessment: 19 YOM with atrial fibrillation on Eliquis PTA s/p R & L heart cath found to have 3v disease s/p  LVAD  Implant 4/8. Warfarin initiated on 4/9 after discussing with MD.  INR quick jump today to 6.2 (verified x 2).  No overt bleeding noted.  CBC stable.  Hgb stable post op 10.5, pltc 135, little CT output no noted bleeding, Tbili 1.7>2.2>2   Goal of Therapy:  INR 2-2.5 Heparin level 0.3-0.7 units/ml Monitor platelets by anticoagulation protocol: Yes   Plan:  No Coumadin today Daily PT/INR  Monitor s/s bleeding, Tbili Reduce aspirin to 81 mg daily LFTs pending.  Marguerite Olea, [facility] Clinical Pharmacist Phone [phone]  03/24/2020 8:19 AM   Please check [email or web address] for unit-specific pharmacist phone numbers

## 2020-03-24 NOTE — Plan of Care (Signed)
Pt is alert and oriented but very fatigued and is not very conversant with RN.  Patient had no complaints of nausea and diet was advanced to carb mod/heart healthy.  Patient was excited to eat when lunch tray arrived but only ate approximately 10% and then complained of feeling "bloated".  Bowel sounds present and no abdominal distention noted.  Patient did not wish to eat any dinner.      Patient frequently removes nasal cannula and oxygen saturation levels drop to mid 80's.   Pt was reassured, reeducated, and redirected often throughout the shift to maintain nasal cannula in position.   Patient had bleeding from right IJ introducer site, pressure was held for 10 minutes and dressing changed.  No additional bleeding noted from site.    Patient's urine became darker throughout the shift and is a brown/maroon color with sediment and hematuria. MD Bartle aware of urine change during evening rounds.   Physical therapy deferred working with patient due to high INR and concerns for bleeding/injury.  RN attempted to keep patient awake by placing bed in high fowlers position and lights on in the room but patient continued to fall asleep.   No alarms on LVAD.

## 2020-03-24 NOTE — Progress Notes (Signed)
OT Cancellation Note  Patient Details Name: [name] [name] MRN: [id number] DOB: [birth year]   Cancelled Treatment:    Reason Eval/Treat Not Completed: Medical issues which prohibited therapy(6.2 INR)  [name] 03/24/2020, 1:36 PM  Nestor Lewandowsky, OTR/L Acute Rehabilitation Services Pager: [phone] Office: [phone]

## 2020-03-24 NOTE — Progress Notes (Signed)
CSW met at bedside with patient and stepdaughter [name]. Daughter reports that patient seems "out of it" although was able to engage briefly with his eyes closed most of the time. Daughter states she plans to visit daily after work and should arrive around 3;30pm. CSW discussed bedside training with Juliaetta and daughter states she is ready to learn. CSW provided supportive intervention and will continue to follow throughout implant hospitalization. [name], [place], [place]

## 2020-03-24 NOTE — Progress Notes (Signed)
Inpatient Diabetes Program Recommendations  AACE/ADA: New Consensus Statement on Inpatient Glycemic Control (2015)  Target Ranges:  Prepandial:   less than 140 mg/dL      Peak postprandial:   less than 180 mg/dL (1-2 hours)      Critically ill patients:  140 - 180 mg/dL   Results for [name] [name], [name] [name] (MRN [id number]) as of 03/24/2020 07:37  Ref. Range 03/23/2020 20:14 03/23/2020 23:42 03/24/2020 01:21 03/24/2020 03:37 03/24/2020 06:41  Glucose-Capillary Latest Ref Range: 70 - 99 mg/dL 107 (H)  2.6 units/hr 128 (H)  3.8 units/hr 122 (H)  3 units/hr 111 (H)  2 units/hr 118 (H)  3 units/hr     Home DM Meds: Basaglar 50 units Daily       Novolog 6 units TID with meals  Current Orders: IV Insulin Drip      MD- Has been getting an average of 3 units/hr of IV Insulin since last PM.    If decision made to transition to SQ insulin, please consider the following:  1. Start Lantus 55 units Daily (make sure pt gets 1st dose at least 1 hour prior to d/c of the IV Insulin Drip)--This would be about 80% of the total amount of Insulin pt has been getting continuously on the IV Insulin Drip)  2. Start Novolog 0-24 units Q4 hours     --Will follow patient during hospitalization--  Wyn Quaker RN, MSN, CDE Diabetes Coordinator Inpatient Glycemic Control Team Team Pager: [phone] (8a-5p)

## 2020-03-25 ENCOUNTER — Inpatient Hospital Stay (HOSPITAL_COMMUNITY): Payer: Medicare HMO

## 2020-03-25 DIAGNOSIS — R570 Cardiogenic shock: Secondary | ICD-10-CM | POA: Diagnosis not present

## 2020-03-25 DIAGNOSIS — Z95811 Presence of heart assist device: Secondary | ICD-10-CM | POA: Diagnosis not present

## 2020-03-25 DIAGNOSIS — N179 Acute kidney failure, unspecified: Secondary | ICD-10-CM

## 2020-03-25 DIAGNOSIS — Z7189 Other specified counseling: Secondary | ICD-10-CM | POA: Diagnosis not present

## 2020-03-25 DIAGNOSIS — Z515 Encounter for palliative care: Secondary | ICD-10-CM | POA: Diagnosis not present

## 2020-03-25 DIAGNOSIS — I255 Ischemic cardiomyopathy: Secondary | ICD-10-CM | POA: Diagnosis not present

## 2020-03-25 LAB — CBC WITH DIFFERENTIAL/PLATELET
Abs Immature Granulocytes: 0.36 10*3/uL — ABNORMAL HIGH (ref 0.00–0.07)
Basophils Absolute: 0 10*3/uL (ref 0.0–0.1)
Basophils Relative: 0 %
Eosinophils Absolute: 0 10*3/uL (ref 0.0–0.5)
Eosinophils Relative: 0 %
HCT: 27.8 % — ABNORMAL LOW (ref 39.0–52.0)
Hemoglobin: 9.1 g/dL — ABNORMAL LOW (ref 13.0–17.0)
Immature Granulocytes: 3 %
Lymphocytes Relative: 2 %
Lymphs Abs: 0.2 10*3/uL — ABNORMAL LOW (ref 0.7–4.0)
MCH: 29.7 pg (ref 26.0–34.0)
MCHC: 32.7 g/dL (ref 30.0–36.0)
MCV: 90.8 fL (ref 80.0–100.0)
Monocytes Absolute: 1.2 10*3/uL — ABNORMAL HIGH (ref 0.1–1.0)
Monocytes Relative: 11 %
Neutro Abs: 9.2 10*3/uL — ABNORMAL HIGH (ref 1.7–7.7)
Neutrophils Relative %: 84 %
Platelets: 143 10*3/uL — ABNORMAL LOW (ref 150–400)
RBC: 3.06 MIL/uL — ABNORMAL LOW (ref 4.22–5.81)
RDW: 14.8 % (ref 11.5–15.5)
WBC: 11.1 10*3/uL — ABNORMAL HIGH (ref 4.0–10.5)
nRBC: 0.3 % — ABNORMAL HIGH (ref 0.0–0.2)

## 2020-03-25 LAB — URINE CULTURE: Culture: NO GROWTH

## 2020-03-25 LAB — URINALYSIS, MICROSCOPIC (REFLEX): RBC / HPF: >50 RBC/hpf (ref 0–5)

## 2020-03-25 LAB — POCT I-STAT 7, (LYTES, BLD GAS, ICA,H+H)
Acid-base deficit: 4 mmol/L — ABNORMAL HIGH (ref 0.0–2.0)
Acid-base deficit: 3 mmol/L — ABNORMAL HIGH (ref 0.0–2.0)
Acid-base deficit: 5 mmol/L — ABNORMAL HIGH (ref 0.0–2.0)
Bicarbonate: 20.3 mmol/L (ref 20.0–28.0)
Bicarbonate: 20.1 mmol/L (ref 20.0–28.0)
Bicarbonate: 19.3 mmol/L — ABNORMAL LOW (ref 20.0–28.0)
Calcium, Ion: 1.14 mmol/L — ABNORMAL LOW (ref 1.15–1.40)
Calcium, Ion: 1.15 mmol/L (ref 1.15–1.40)
Calcium, Ion: 1.15 mmol/L (ref 1.15–1.40)
HCT: 28 % — ABNORMAL LOW (ref 39.0–52.0)
HCT: 27 % — ABNORMAL LOW (ref 39.0–52.0)
HCT: 29 % — ABNORMAL LOW (ref 39.0–52.0)
Hemoglobin: 9.5 g/dL — ABNORMAL LOW (ref 13.0–17.0)
Hemoglobin: 9.2 g/dL — ABNORMAL LOW (ref 13.0–17.0)
Hemoglobin: 9.9 g/dL — ABNORMAL LOW (ref 13.0–17.0)
O2 Saturation: 91 %
O2 Saturation: 94 %
O2 Saturation: 96 %
Patient temperature: 36.1
Patient temperature: 36
Patient temperature: 35.2
Potassium: 3.4 mmol/L — ABNORMAL LOW (ref 3.5–5.1)
Potassium: 3.2 mmol/L — ABNORMAL LOW (ref 3.5–5.1)
Potassium: 3.9 mmol/L (ref 3.5–5.1)
Sodium: 131 mmol/L — ABNORMAL LOW (ref 135–145)
Sodium: 131 mmol/L — ABNORMAL LOW (ref 135–145)
Sodium: 132 mmol/L — ABNORMAL LOW (ref 135–145)
TCO2: 21 mmol/L — ABNORMAL LOW (ref 22–32)
TCO2: 21 mmol/L — ABNORMAL LOW (ref 22–32)
TCO2: 20 mmol/L — ABNORMAL LOW (ref 22–32)
pCO2 arterial: 31.3 mmHg — ABNORMAL LOW (ref 32.0–48.0)
pCO2 arterial: 28.3 mmHg — ABNORMAL LOW (ref 32.0–48.0)
pCO2 arterial: 29.4 mmHg — ABNORMAL LOW (ref 32.0–48.0)
pH, Arterial: 7.416 (ref 7.350–7.450)
pH, Arterial: 7.456 — ABNORMAL HIGH (ref 7.350–7.450)
pH, Arterial: 7.417 (ref 7.350–7.450)
pO2, Arterial: 57 mmHg — ABNORMAL LOW (ref 83.0–108.0)
pO2, Arterial: 61 mmHg — ABNORMAL LOW (ref 83.0–108.0)
pO2, Arterial: 74 mmHg — ABNORMAL LOW (ref 83.0–108.0)

## 2020-03-25 LAB — URINALYSIS, ROUTINE W REFLEX MICROSCOPIC
Glucose, UA: NEGATIVE mg/dL
Ketones, ur: 15 mg/dL — AB
Nitrite: POSITIVE — AB
Protein, ur: 100 mg/dL — AB
Specific Gravity, Urine: 1.025 (ref 1.005–1.030)
pH: 6.5 (ref 5.0–8.0)

## 2020-03-25 LAB — COOXEMETRY PANEL
Carboxyhemoglobin: 1.8 % — ABNORMAL HIGH (ref 0.5–1.5)
Methemoglobin: 1.1 % (ref 0.0–1.5)
O2 Saturation: 62.5 %
Total hemoglobin: 9.4 g/dL — ABNORMAL LOW (ref 12.0–16.0)

## 2020-03-25 LAB — GLUCOSE, CAPILLARY
Glucose-Capillary: 104 mg/dL — ABNORMAL HIGH (ref 70–99)
Glucose-Capillary: 108 mg/dL — ABNORMAL HIGH (ref 70–99)
Glucose-Capillary: 179 mg/dL — ABNORMAL HIGH (ref 70–99)
Glucose-Capillary: 160 mg/dL — ABNORMAL HIGH (ref 70–99)
Glucose-Capillary: 167 mg/dL — ABNORMAL HIGH (ref 70–99)
Glucose-Capillary: 145 mg/dL — ABNORMAL HIGH (ref 70–99)
Glucose-Capillary: 130 mg/dL — ABNORMAL HIGH (ref 70–99)
Glucose-Capillary: 82 mg/dL (ref 70–99)
Glucose-Capillary: 84 mg/dL (ref 70–99)
Glucose-Capillary: 112 mg/dL — ABNORMAL HIGH (ref 70–99)
Glucose-Capillary: 113 mg/dL — ABNORMAL HIGH (ref 70–99)
Glucose-Capillary: 152 mg/dL — ABNORMAL HIGH (ref 70–99)
Glucose-Capillary: 167 mg/dL — ABNORMAL HIGH (ref 70–99)
Glucose-Capillary: 165 mg/dL — ABNORMAL HIGH (ref 70–99)

## 2020-03-25 LAB — RENAL FUNCTION PANEL
Albumin: 2.7 g/dL — ABNORMAL LOW (ref 3.5–5.0)
Anion gap: 19 — ABNORMAL HIGH (ref 5–15)
BUN: 70 mg/dL — ABNORMAL HIGH (ref 8–23)
CO2: 18 mmol/L — ABNORMAL LOW (ref 22–32)
Calcium: 8.5 mg/dL — ABNORMAL LOW (ref 8.9–10.3)
Chloride: 97 mmol/L — ABNORMAL LOW (ref 98–111)
Creatinine, Ser: 3.71 mg/dL — ABNORMAL HIGH (ref 0.61–1.24)
GFR calc Af Amer: 18 mL/min — ABNORMAL LOW (ref >60)
GFR calc non Af Amer: 16 mL/min — ABNORMAL LOW (ref >60)
Glucose, Bld: 150 mg/dL — ABNORMAL HIGH (ref 70–99)
Phosphorus: 5.1 mg/dL — ABNORMAL HIGH (ref 2.5–4.6)
Potassium: 4 mmol/L (ref 3.5–5.1)
Sodium: 134 mmol/L — ABNORMAL LOW (ref 135–145)

## 2020-03-25 LAB — PROTIME-INR
INR: 9 (ref 0.8–1.2)
Prothrombin Time: 73.8 seconds — ABNORMAL HIGH (ref 11.4–15.2)

## 2020-03-25 LAB — BASIC METABOLIC PANEL
Anion gap: 18 — ABNORMAL HIGH (ref 5–15)
BUN: 71 mg/dL — ABNORMAL HIGH (ref 8–23)
CO2: 18 mmol/L — ABNORMAL LOW (ref 22–32)
Calcium: 8.4 mg/dL — ABNORMAL LOW (ref 8.9–10.3)
Chloride: 94 mmol/L — ABNORMAL LOW (ref 98–111)
Creatinine, Ser: 3.98 mg/dL — ABNORMAL HIGH (ref 0.61–1.24)
GFR calc Af Amer: 17 mL/min — ABNORMAL LOW (ref >60)
GFR calc non Af Amer: 14 mL/min — ABNORMAL LOW (ref >60)
Glucose, Bld: 178 mg/dL — ABNORMAL HIGH (ref 70–99)
Potassium: 3.5 mmol/L (ref 3.5–5.1)
Sodium: 130 mmol/L — ABNORMAL LOW (ref 135–145)

## 2020-03-25 LAB — PHOSPHORUS: Phosphorus: 5.4 mg/dL — ABNORMAL HIGH (ref 2.5–4.6)

## 2020-03-25 LAB — LACTATE DEHYDROGENASE: LDH: 482 U/L — ABNORMAL HIGH (ref 98–192)

## 2020-03-25 LAB — VANCOMYCIN, RANDOM: Vancomycin Rm: 28

## 2020-03-25 LAB — SODIUM, URINE, RANDOM: Sodium, Ur: 17 mmol/L

## 2020-03-25 LAB — CREATININE, URINE, RANDOM: Creatinine, Urine: 82.59 mg/dL

## 2020-03-25 LAB — MAGNESIUM: Magnesium: 2.3 mg/dL (ref 1.7–2.4)

## 2020-03-25 MED ORDER — CHLORHEXIDINE GLUCONATE 0.12 % MT SOLN
15.0000 mL | Freq: Two times a day (BID) | OROMUCOSAL | Status: DC
  Administered 2020-03-25 – 2020-03-27 (×5): 15 mL via OROMUCOSAL
  Filled 2020-03-25 (×2): qty 15

## 2020-03-25 MED ORDER — PRISMASOL BGK 4/2.5 32-4-2.5 MEQ/L IV SOLN
INTRAVENOUS | Status: DC
  Administered 2020-03-28 – 2020-03-31 (×10): 1500 mL/h via INTRAVENOUS_CENTRAL
  Administered 2020-04-02 – 2020-04-03 (×4): 1 mL via INTRAVENOUS_CENTRAL

## 2020-03-25 MED ORDER — PANTOPRAZOLE SODIUM 40 MG IV SOLR
40.0000 mg | Freq: Every day | INTRAVENOUS | Status: DC
  Administered 2020-03-25 – 2020-04-28 (×36): 40 mg via INTRAVENOUS
  Filled 2020-03-25 (×36): qty 40

## 2020-03-25 MED ORDER — PRISMASOL BGK 4/2.5 32-4-2.5 MEQ/L REPLACEMENT SOLN
Status: DC
  Administered 2020-03-30 (×2): 500 mL via INTRAVENOUS_CENTRAL
  Administered 2020-04-02 – 2020-04-03 (×2): 1 via INTRAVENOUS_CENTRAL

## 2020-03-25 MED ORDER — SODIUM CHLORIDE 0.9 % IV SOLN: INTRAVENOUS | Status: DC | PRN

## 2020-03-25 MED ORDER — HEPARIN SODIUM (PORCINE) 1000 UNIT/ML DIALYSIS
1000.0000 [IU] | INTRAMUSCULAR | Status: DC | PRN
  Administered 2020-04-01: 1400 [IU] via INTRAVENOUS_CENTRAL
  Administered 2020-04-12: 2800 [IU] via INTRAVENOUS_CENTRAL
  Filled 2020-03-25 (×2): qty 6
  Filled 2020-03-25: qty 3
  Filled 2020-03-25: qty 6

## 2020-03-25 MED ORDER — PRISMASOL BGK 4/2.5 32-4-2.5 MEQ/L REPLACEMENT SOLN
Status: DC
  Administered 2020-03-28: 200 mL via INTRAVENOUS_CENTRAL
  Administered 2020-03-29: 500 mL via INTRAVENOUS_CENTRAL

## 2020-03-25 MED ORDER — VITAMIN K1 10 MG/ML IJ SOLN
1.0000 mg | Freq: Once | INTRAVENOUS | Status: AC
  Administered 2020-03-25: 07:00:00 1 mg via INTRAVENOUS
  Filled 2020-03-25: qty 0.1

## 2020-03-25 MED ORDER — SODIUM CHLORIDE 0.9 % FOR CRRT: INTRAVENOUS_CENTRAL | Status: DC | PRN

## 2020-03-25 MED ORDER — VANCOMYCIN HCL 1250 MG/250ML IV SOLN
1250.0000 mg | INTRAVENOUS | Status: AC
  Administered 2020-03-25 – 2020-03-29 (×5): 1250 mg via INTRAVENOUS
  Filled 2020-03-25 (×5): qty 250

## 2020-03-25 MED ORDER — HEPARIN SODIUM (PORCINE) 1000 UNIT/ML DIALYSIS
1000.0000 [IU] | INTRAMUSCULAR | Status: DC | PRN
  Administered 2020-03-25 – 2020-03-27 (×3): 2800 [IU] via INTRAVENOUS_CENTRAL
  Filled 2020-03-25: qty 4
  Filled 2020-03-25: qty 3
  Filled 2020-03-25 (×2): qty 6
  Filled 2020-03-25: qty 3
  Filled 2020-03-25: qty 2
  Filled 2020-03-25: qty 3
  Filled 2020-03-25 (×4): qty 6

## 2020-03-25 MED ORDER — LORAZEPAM 2 MG/ML IJ SOLN
0.5000 mg | Freq: Four times a day (QID) | INTRAMUSCULAR | Status: DC | PRN
  Administered 2020-03-25 – 2020-03-26 (×2): 0.5 mg via INTRAVENOUS
  Filled 2020-03-25 (×3): qty 1

## 2020-03-25 MED ORDER — DEXMEDETOMIDINE HCL IN NACL 400 MCG/100ML IV SOLN
0.4000 ug/kg/h | INTRAVENOUS | Status: DC
  Administered 2020-03-25: 0.5 ug/kg/h via INTRAVENOUS
  Administered 2020-03-25: 0.4 ug/kg/h via INTRAVENOUS
  Administered 2020-03-26: 0.6 ug/kg/h via INTRAVENOUS
  Administered 2020-03-28: 0.1 ug/kg/h via INTRAVENOUS
  Administered 2020-03-29: 0.3 ug/kg/h via INTRAVENOUS
  Filled 2020-03-25 (×7): qty 100

## 2020-03-25 MED ORDER — POTASSIUM CHLORIDE 10 MEQ/50ML IV SOLN
10.0000 meq | INTRAVENOUS | Status: AC
  Administered 2020-03-25 (×2): 10 meq via INTRAVENOUS
  Filled 2020-03-25 (×2): qty 50

## 2020-03-25 MED ORDER — ASPIRIN 300 MG RE SUPP
150.0000 mg | Freq: Once | RECTAL | Status: AC
  Administered 2020-03-25: 150 mg via RECTAL
  Filled 2020-03-25: qty 1

## 2020-03-25 MED ORDER — ORAL CARE MOUTH RINSE
15.0000 mL | Freq: Two times a day (BID) | OROMUCOSAL | Status: DC
  Administered 2020-03-25 – 2020-03-27 (×4): 15 mL via OROMUCOSAL

## 2020-03-25 MED ORDER — HEPARIN (PORCINE) 2000 UNITS/L FOR CRRT
INTRAVENOUS_CENTRAL | Status: DC | PRN
  Filled 2020-03-25: qty 1000

## 2020-03-25 MED ORDER — SODIUM CHLORIDE 0.9 % IV SOLN
2.0000 g | Freq: Two times a day (BID) | INTRAVENOUS | Status: AC
  Administered 2020-03-25 – 2020-03-29 (×9): 2 g via INTRAVENOUS
  Filled 2020-03-25 (×9): qty 2

## 2020-03-25 MED ORDER — INSULIN ASPART 100 UNIT/ML ~~LOC~~ SOLN
0.0000 [IU] | SUBCUTANEOUS | Status: DC
  Administered 2020-03-25: 2 [IU] via SUBCUTANEOUS
  Administered 2020-03-25 – 2020-03-26 (×4): 4 [IU] via SUBCUTANEOUS
  Administered 2020-03-26: 2 [IU] via SUBCUTANEOUS
  Administered 2020-03-26 – 2020-03-27 (×4): 4 [IU] via SUBCUTANEOUS
  Administered 2020-03-27 (×2): 8 [IU] via SUBCUTANEOUS
  Administered 2020-03-27: 4 [IU] via SUBCUTANEOUS
  Administered 2020-03-27: 2 [IU] via SUBCUTANEOUS
  Administered 2020-03-28 (×3): 8 [IU] via SUBCUTANEOUS
  Administered 2020-03-28: 4 [IU] via SUBCUTANEOUS
  Administered 2020-03-28 (×2): 8 [IU] via SUBCUTANEOUS
  Administered 2020-03-28: 4 [IU] via SUBCUTANEOUS
  Administered 2020-03-29 (×3): 8 [IU] via SUBCUTANEOUS
  Administered 2020-03-29 (×2): 4 [IU] via SUBCUTANEOUS
  Administered 2020-03-29: 12 [IU] via SUBCUTANEOUS
  Administered 2020-03-30: 20 [IU] via SUBCUTANEOUS
  Administered 2020-03-30: 15 [IU] via SUBCUTANEOUS
  Administered 2020-03-30: 4 [IU] via SUBCUTANEOUS
  Administered 2020-03-30 – 2020-03-31 (×4): 8 [IU] via SUBCUTANEOUS
  Administered 2020-03-31: 4 [IU] via SUBCUTANEOUS
  Administered 2020-03-31: 8 [IU] via SUBCUTANEOUS
  Administered 2020-04-01: 16 [IU] via SUBCUTANEOUS
  Administered 2020-04-01 (×2): 4 [IU] via SUBCUTANEOUS
  Administered 2020-04-01: 12 [IU] via SUBCUTANEOUS
  Administered 2020-04-02 (×3): 4 [IU] via SUBCUTANEOUS
  Administered 2020-04-02: 8 [IU] via SUBCUTANEOUS
  Administered 2020-04-02: 4 [IU] via SUBCUTANEOUS
  Administered 2020-04-02: 8 [IU] via SUBCUTANEOUS
  Administered 2020-04-02: 4 [IU] via SUBCUTANEOUS
  Administered 2020-04-03: 12 [IU] via SUBCUTANEOUS
  Administered 2020-04-03: 4 [IU] via SUBCUTANEOUS
  Administered 2020-04-03 (×2): 8 [IU] via SUBCUTANEOUS
  Administered 2020-04-03: 12 [IU] via SUBCUTANEOUS
  Administered 2020-04-04 (×3): 2 [IU] via SUBCUTANEOUS
  Administered 2020-04-04: 8 [IU] via SUBCUTANEOUS
  Administered 2020-04-04: 4 [IU] via SUBCUTANEOUS
  Administered 2020-04-04: 2 [IU] via SUBCUTANEOUS
  Administered 2020-04-05: 8 [IU] via SUBCUTANEOUS
  Administered 2020-04-05 (×3): 2 [IU] via SUBCUTANEOUS
  Administered 2020-04-05 – 2020-04-06 (×2): 4 [IU] via SUBCUTANEOUS
  Administered 2020-04-06 – 2020-04-07 (×4): 2 [IU] via SUBCUTANEOUS
  Administered 2020-04-07: 4 [IU] via SUBCUTANEOUS
  Administered 2020-04-08: 2 [IU] via SUBCUTANEOUS
  Administered 2020-04-08 (×2): 4 [IU] via SUBCUTANEOUS
  Administered 2020-04-08 – 2020-04-16 (×22): 2 [IU] via SUBCUTANEOUS
  Administered 2020-04-16: 4 [IU] via SUBCUTANEOUS
  Administered 2020-04-17: 2 [IU] via SUBCUTANEOUS
  Administered 2020-04-17: 4 [IU] via SUBCUTANEOUS
  Administered 2020-04-17 – 2020-04-18 (×5): 2 [IU] via SUBCUTANEOUS
  Administered 2020-04-18: 4 [IU] via SUBCUTANEOUS
  Administered 2020-04-19 – 2020-04-20 (×6): 2 [IU] via SUBCUTANEOUS
  Administered 2020-04-20: 4 [IU] via SUBCUTANEOUS
  Administered 2020-04-20 (×2): 2 [IU] via SUBCUTANEOUS
  Administered 2020-04-20: 4 [IU] via SUBCUTANEOUS
  Administered 2020-04-21 – 2020-04-22 (×3): 2 [IU] via SUBCUTANEOUS
  Administered 2020-04-23 – 2020-04-24 (×4): 4 [IU] via SUBCUTANEOUS
  Administered 2020-04-25: 2 [IU] via SUBCUTANEOUS
  Administered 2020-04-25 (×2): 8 [IU] via SUBCUTANEOUS
  Administered 2020-04-26 (×2): 4 [IU] via SUBCUTANEOUS
  Administered 2020-04-26 – 2020-04-27 (×6): 2 [IU] via SUBCUTANEOUS
  Administered 2020-04-27: 4 [IU] via SUBCUTANEOUS
  Administered 2020-04-27 – 2020-04-28 (×3): 2 [IU] via SUBCUTANEOUS
  Administered 2020-04-28: 0 [IU] via SUBCUTANEOUS
  Administered 2020-04-28 (×2): 2 [IU] via SUBCUTANEOUS
  Administered 2020-04-29: 4 [IU] via SUBCUTANEOUS
  Administered 2020-04-29: 2 [IU] via SUBCUTANEOUS
  Administered 2020-04-29: 4 [IU] via SUBCUTANEOUS
  Administered 2020-04-30 – 2020-05-02 (×5): 2 [IU] via SUBCUTANEOUS

## 2020-03-25 NOTE — Progress Notes (Signed)
Pt pulled off nasal cannula, oxygen was in the mid 80s, pt reeducated on oxygenation importance. Pt pulled out aline, RRT at bedside. Pressure was held for 20 mins to control bleeding. Pressure dressing placed. Hemodynamically stable. Pt remains drowsy, requests water, placed on venti mask by RRT. Pt denies pain and nausea at this time.

## 2020-03-25 NOTE — Progress Notes (Signed)
Palliative:  HPI: 69 yo male with PMH ICM, systolic CHF EF 16%, LAD stenting, s/p AICD, MVP s/p mitral valve repair 2004, atrial fibrillation on anticoagulation, HTN, HLD, diabetes, depression admitted to [facility] with worsening weakness, fatigue, dyspnea, chest pressure and subsequent transfer to [facility] for cardiac cath which showed severe three vessel CAD and ECHO shows EF 10-155 with moderate RV dysfunction. LVAD placed 4/8. 4/10 ECHO shows significant RV dysfunction. Since 4/12 he has had continued decline in mental status and 4/13 began CRRT, required BiPAP support, and INR reversed with Vit K.   I met today at Mr. Krupp's bedside but he is confused and restless. Has recently began CRRT and received Vitamin K to reverse INR which was critically elevated. He is on BiPAP but this was initiated early this morning for oxygen support although he was never found to be hypercarbic on ABG. Attending is running further diagnostics to rule out further etiology of clinical decline over past couple days although I have noted thoughts of potential cardiorenal with RV dysfunction with worsening renal failure.   I called and spoke with stepdaughter, Pricilla Loveless, who Mr. Throne previously voiced to be his surrogate decision maker. I explained to her the changes and concerns as highlighted above with need for BiPAP and CRRT. Explained that I hope that CRRT will clear toxins from renal failure and potentially lead to improved mental status but this is expected to take time. Plans currently to continue supporting Mr. Fellenz with aggressive care and watchful waiting to see how he responds with current interventions. [name] tries to come by after work usually ~1530 in the afternoons to visit so I will try and keep her updated and supported in person or at least via telephone.    Exam: Lethargic, confused, restless/agitated. On BiPAP with no respiratory distress or accessory muscle usage. VSS with  occasional hypertension. Off epi infusion and continues on amiodarone and milrinone infusions. Abd round/soft. Moving all extremities but mostly non-purposeful movement.   Plan: - Continue with watchful waiting and full aggressive care.  - Hopeful for improvement with CRRT.   25 min  Vinie Sill, NP Palliative Medicine Team Pager [phone] (Please see [email or web address] for schedule) Team Phone [phone]    Greater than 50%  of this time was spent counseling and coordinating care related to the above assessment and plan

## 2020-03-25 NOTE — Progress Notes (Signed)
OT Cancellation Note  Patient Details Name: [name] [name] MRN: [id number] DOB: 20-Feb-1951   Cancelled Treatment:    Reason Eval/Treat Not Completed: Medical issues which prohibited therapy; pt now on CRRT and BiPap. Will follow.  Lou Cal, OT Acute Rehabilitation Services Pager [phone] Office [phone]   [name] 03/25/2020, 1:22 PM

## 2020-03-25 NOTE — Progress Notes (Signed)
Pt placed back on bipap with NO inline due to increased WOB and SOB. RT will continue to closely monitor pt

## 2020-03-25 NOTE — Progress Notes (Signed)
PT Cancellation Note  Patient Details Name: [name] [name] MRN: [id number] DOB: [birth year]   Cancelled Treatment:    Reason Eval/Treat Not Completed: Medical issues which prohibited therapy(INR 9 and not appropriate for mobility)   Matty Vanroekel B Delories Mauri 03/25/2020, 6:44 AM  Bayard Males, PT Acute Rehabilitation Services Pager: [phone] Office: [phone]

## 2020-03-25 NOTE — Progress Notes (Signed)
Pharmacy Antibiotic Note  [name] [name] is a 69 y.o. male admitted on 03/02/2020 now s/p LVAD HMIII implant with elevated WBC and concern for PNA.  Pharmacy has been consulted for empiric cefepime and vancomycin dosing.  Scr up to 3.98 today and has begun CRRT (starting around 10AM today. WBC improving to 11.1  Last dose of vancomycin was given yesterday around 1230. Random vancomycin level this AM was 28.  Plan: Adjust cefepime to 2g q12hrs while on CRRT Adjust vancomycin to 1250 mg IV q24h Monitor cultures, renal function/CRRT duration, vancomycin levels at steady state and LOT  Height: 6' (182.9 cm) Weight: 118.3 kg (260 lb 12.9 oz) IBW/kg (Calculated) : 77.6  Temp (24hrs), Avg:96.9 F (36.1 C), Min:95 F (35 C), Max:97.9 F (36.6 C)  Recent Labs  Lab 03/21/20 1452 03/21/20 1452 03/22/20 0307 03/22/20 0307 03/22/20 1454 03/23/20 0335 03/23/20 1523 03/24/20 0343 03/25/20 0303  WBC 14.0*  --  12.0*  --   --  17.8*  --  16.0* 11.1*  CREATININE 1.95*   < > 2.23*   < > 2.60* 2.88* 3.20* 3.57* 3.98*  VANCORANDOM  --   --   --   --   --   --   --   --  28   < > = values in this interval not displayed.    Estimated Creatinine Clearance: 22.9 mL/min (A) (by C-G formula based on SCr of 3.98 mg/dL (H)).    Allergies  Allergen Reactions  . Liraglutide Nausea And Vomiting  . Lisinopril Cough    Antimicrobials this admission: Periop abx 4/8>>4/10 Cefepime 4/11 >> Vancomycin 4/12 >>  Dose adjustments this admission: 4/13: CRRT dose adjustments for vancomycin and cefepime  Microbiology results: 4/7 MRSA PCR: neg 4/13 BCx: sent 4/12 UCx: ngF  Thank you for allowing pharmacy to be a part of this patient's care.  Vertis Kelch, PharmD, [facility] PGY2 Cardiology Pharmacy Resident Phone [phone] 03/25/2020       12:27 PM  Please check [email or web address] for unit-specific pharmacist phone numbers

## 2020-03-25 NOTE — Procedures (Signed)
Arterial Catheter Insertion Procedure Note [name] [name] [id number] 09-12-1951  Procedure: Insertion of Arterial Catheter  Indications: Blood pressure monitoring  Procedure Details Consent: Risks of procedure as well as the alternatives and risks of each were explained to the (patient/caregiver).  Consent for procedure obtained. Time Out: Verified patient identification, verified procedure, site/side was marked, verified correct patient position, special equipment/implants available, medications/allergies/relevent history reviewed, required imaging and test results available.  Performed  Maximum sterile technique was used including antiseptics, cap, gloves, gown, hand hygiene, mask and sheet. Skin prep: Chlorhexidine; local anesthetic administered 20 gauge catheter was inserted into right radial artery using the Seldinger technique. ULTRASOUND GUIDANCE USED: NO Evaluation Blood flow good; BP tracing good. Complications: No apparent complications.  RT placed a-line x1 attempt without difficulty.   Sharla Kidney 03/25/2020

## 2020-03-25 NOTE — Progress Notes (Signed)
Paged Darrick Grinder NP in regards to patient's medications that were ordered p.o. now that patient is NPO with BiPAP in place. She will review medication list and make changes. Also spoke with pharmacy who was able to switch a couple of things.

## 2020-03-25 NOTE — Plan of Care (Signed)
  Problem: Activity: Goal: Ability to return to baseline activity level will improve Outcome: Not Progressing   Problem: Cardiovascular: Goal: Ability to achieve and maintain adequate cardiovascular perfusion will improve Outcome: Not Progressing   Problem: Education: Goal: Knowledge of General Education information will improve Description: Including pain rating scale, medication(s)/side effects and non-pharmacologic comfort measures Outcome: Not Progressing   Problem: Safety: Goal: Ability to remain free from injury will improve Outcome: Not Progressing   Problem: Fluid Volume: Goal: Risk for excess fluid volume will decrease Outcome: Not Progressing   Problem: Clinical Measurements: Goal: Ability to maintain clinical measurements within normal limits will improve Outcome: Not Progressing   Problem: Respiratory: Goal: Will regain and/or maintain adequate ventilation Outcome: Not Progressing

## 2020-03-25 NOTE — Progress Notes (Addendum)
Patient ID: [name] [name], male   DOB: August 31, 1951, 69 y.o.   MRN: [id number]    Advanced Heart Failure Rounding Note   Subjective:    - S/p HM3 VAD 4/8 w MAZE procedure + LAA clipping. - Extubated 4/9  LVAD speed increased to 5400 earlier --> echo moderate-severe RV dysfunction, septum mildly left shifted, trivial pericardial effusion.  LVAD speed cut back to 5300.   He is currently on milrinone 0.375, epinephrine 1, Lasix gtt at 15, NO 5 ppm, amiodarone 30 mg/hr.    INR 9 today. Received 1 mg vitamin K.   Sluggish urine output. Creatinine trending up 1.95 => 2.23 => 2.88 => 3.2 => 3.98   Placed on Bipap this morning.   RUQ [place]: cholelithiasis without definite evidence for cholecystitis  Swan #s PA 45/22 CVP 20 Thermo CI  3.3 Co-ox 63%  LDH 482  tbili 2.2 => 2=> 1.5.=>1.5  AST/ALT normal   VAD Interrogation  Flow 4.4, Speed 5300, Power 4, Pulse Index 3.3. No PI events   Objective:   Weight Range:  Vital Signs:   Temp:  [96.4 F (35.8 C)-97.9 F (36.6 C)] 96.4 F (35.8 C) (04/13 0723) Pulse Rate:  [63-119] 110 (04/13 0723) Resp:  [12-32] 32 (04/13 0723) BP: (95-166)/(75-145) 117/94 (04/13 0723) SpO2:  [85 %-100 %] 100 % (04/13 0723) Arterial Line BP: (92-130)/(65-91) 113/77 (04/13 0230) FiO2 (%):  [50 %-60 %] 50 % (04/13 0723) Weight:  [118.3 kg] 118.3 kg (04/13 0317) Last BM Date: 04/06/2020  Weight change: Filed Weights   03/23/20 0336 03/24/20 0345 03/25/20 0317  Weight: 118.3 kg 119 kg 118.3 kg    Intake/Output:   Intake/Output Summary (Last 24 hours) at 03/25/2020 0837 Last data filed at 03/25/2020 0800 Gross per 24 hour  Intake 2414.72 ml  Output 1870 ml  Net 544.72 ml    CVP 20   Physical Exam: GENERAL: On BIpap.  HEENT: normal  NECK: Supple, JVP to jaw.  2+ bilaterally, no bruits.  No lymphadenopathy or thyromegaly appreciated.   CARDIAC:  Mechanical heart sounds with LVAD hum present.  LUNGS:  On bipap. Crackles in the bases  ABDOMEN:   Soft, round, nontender, positive bowel sounds x4.     LVAD exit site:  Dressing dry and intact.  No erythema or drainage.  Stabilization device present and accurately applied.  Driveline dressing is being changed daily per sterile technique. EXTREMITIES:  Warm and dry, no cyanosis, clubbing, rash or edema  NEUROLOGIC:  Lethargic on Bipap. MAE x4.    Telemetry:  A fib 100s   Labs: Basic Metabolic Panel: Recent Labs  Lab 03/21/20 0209 03/21/20 0222 03/21/20 1452 03/21/20 1459 03/22/20 0307 03/22/20 0413 03/22/20 1454 03/23/20 0326 03/23/20 0335 03/23/20 0335 03/23/20 1523 03/23/20 1523 03/24/20 0343 03/24/20 0404 03/25/20 0259 03/25/20 0303 03/25/20 0512  NA 137   < > 138   < > 136   < > 136   < > 134*   < > 135   < > 134* 134* 131* 130* 131*  K 4.2   < > 4.1   < > 3.6   < > 3.7   < > 3.5   < > 3.4*   < > 3.4* 3.4* 3.4* 3.5 3.2*  CL 102   < > 104   < > 101   < > 102  --  98  --  99  --  97*  --   --  94*  --  CO2 22   < > 22   < > 21*   < > 21*  --  21*  --  20*  --  20*  --   --  18*  --   GLUCOSE 184*   < > 155*   < > 145*   < > 134*  --  116*  --  118*  --  108*  --   --  178*  --   BUN 21   < > 25*   < > 30*   < > 36*  --  40*  --  45*  --  54*  --   --  71*  --   CREATININE 1.68*   < > 1.95*   < > 2.23*   < > 2.60*  --  2.88*  --  3.20*  --  3.57*  --   --  3.98*  --   CALCIUM 8.8*   < > 8.6*   < > 8.5*   < > 9.0  --  8.9   < > 8.4*  --  8.6*  --   --  8.4*  --   MG 2.2   < > 2.1  --  2.1  --   --   --  2.1  --   --   --  2.2  --   --  2.3  --   PHOS 3.9  --   --   --  4.9*  --   --   --  5.7*  --   --   --  5.6*  --   --  5.4*  --    < > = values in this interval not displayed.    Liver Function Tests: Recent Labs  Lab 03/22/20 0307 03/22/20 1454 03/23/20 0335 03/23/20 1523 03/24/20 0343  AST 99* 90* 70* 64* 62*  ALT 26 27 24 24 26   ALKPHOS 59 67 78 74 72  BILITOT 2.2* 2.4* 2.0* 2.2* 1.5*  PROT 5.6* 6.0* 5.9* 5.9* 6.1*  ALBUMIN 3.0* 3.2* 3.0* 2.9* 3.0*    No results for input(s): LIPASE, AMYLASE in the last 168 hours. Recent Labs  Lab 03/24/20 0719  AMMONIA 32    CBC: Recent Labs  Lab 03/21/20 0209 03/21/20 0222 03/21/20 1452 03/21/20 1459 03/22/20 0307 03/22/20 0413 03/23/20 0335 03/23/20 0335 03/24/20 0343 03/24/20 0404 03/25/20 0259 03/25/20 0303 03/25/20 0512  WBC 14.5*   < > 14.0*  --  12.0*  --  17.8*  --  16.0*  --   --  11.1*  --   NEUTROABS 13.1*  --   --   --  10.8*  --  16.0*  --  14.1*  --   --  9.2*  --   HGB 9.5*   < > 8.7*   < > 9.2*   < > 10.5*   < > 9.9* 9.9* 9.5* 9.1* 9.2*  HCT 28.6*   < > 27.0*   < > 28.6*   < > 31.7*   < > 30.0* 29.0* 28.0* 27.8* 27.0*  MCV 93.2   < > 93.1  --  93.5  --  92.2  --  91.7  --   --  90.8  --   PLT 109*   < > 102*  --  92*  --  139*  --  158  --   --  143*  --    < > = values in this interval not  displayed.    Cardiac Enzymes: No results for input(s): CKTOTAL, CKMB, CKMBINDEX, TROPONINI in the last 168 hours.  BNP: BNP (last 3 results) Recent Labs    01/04/20 1642 03/21/20 0209  BNP 165.2* 315.9*    ProBNP (last 3 results) No results for input(s): PROBNP in the last 8760 hours.    Other results:  Imaging: DG Chest 1 View  Result Date: 03/24/2020 CLINICAL DATA:  Chest tube, LVAD EXAM: CHEST  1 VIEW COMPARISON:  03/23/2020 FINDINGS: Support devices are stable. Cardiomegaly. Bilateral airspace disease slightly worsened since prior study. This is greater on the right. No effusions or pneumothorax. No acute bony abnormality. IMPRESSION: Worsening bilateral airspace disease, right greater than left could reflect asymmetric edema or infection. Electronically Signed   By: Rolm Baptise M.D.   On: 03/24/2020 08:02   US Abdomen Limited RUQ  Result Date: 03/24/2020 CLINICAL DATA:  Cholecystitis. EXAM: ULTRASOUND ABDOMEN LIMITED RIGHT UPPER QUADRANT COMPARISON:  CT abdomen and pelvis 03/25/2020 FINDINGS: Examination was limited due to a small acoustic window from  external bandage material as well as patient immobility. Gallbladder: Acoustic shadowing throughout the gallbladder likely secondary to the stones demonstrated on prior CT. The gallbladder wall could not be differentiated from the near surface of the stones. 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 No sonographic Murphy sign noted by sonographer. Common bile duct: Diameter: 4 mm Liver: No focal lesion identified. Within normal limits in parenchymal echogenicity. Portal vein is patent on color Doppler imaging with normal direction of blood flow towards the liver. Other: None. IMPRESSION: Cholelithiasis with limited assessment of the gallbladder wall. No Murphy sign to strongly suggest acute cholecystitis. Electronically Signed   By: Logan Bores M.D.   On: 03/24/2020 11:55     Medications:     Scheduled Medications: . sodium chloride   Intravenous Once  . aspirin  81 mg Oral Daily  . atorvastatin  80 mg Oral q1800  . bisacodyl  10 mg Oral Daily   Or  . bisacodyl  10 mg Rectal Daily  . Chlorhexidine Gluconate Cloth  6 each Topical Daily  . citalopram  20 mg Oral Daily  . colchicine  0.3 mg Oral BID  . docusate sodium  200 mg Oral Daily  . influenza vaccine adjuvanted  0.5 mL Intramuscular Tomorrow-1000  . mouth rinse  15 mL Mouth Rinse BID  . pantoprazole  40 mg Oral Daily  . sildenafil  20 mg Oral TID  . sodium chloride flush  10-40 mL Intracatheter Q12H  . sodium chloride flush  3 mL Intravenous Q12H  . Warfarin - Pharmacist Dosing Inpatient   Does not apply q1600    Infusions: . sodium chloride Stopped (03/22/20 2011)  . sodium chloride Stopped (03/25/20 5885)  . sodium chloride    . sodium chloride 20 mL/hr at 03/24/20 1223  . amiodarone 30 mg/hr (03/25/20 0700)  . ceFEPime (MAXIPIME) IV Stopped (03/24/20 2036)  . DOPamine Stopped (03/22/20 2318)  . epinephrine 1 mcg/min (03/25/20 0700)  . furosemide  (LASIX) infusion 15 mg/hr (03/25/20 0700)  . insulin 3 mL/hr at 03/25/20 0700  . lactated ringers    . lactated ringers 10 mL/hr at 03/25/20 0700  . milrinone 0.375 mcg/kg/min (03/25/20 0700)  . norepinephrine (LEVOPHED) Adult infusion Stopped (03/21/20 1049)  . potassium chloride 10 mEq (03/25/20 0808)    PRN Medications: sodium chloride, acetaminophen, dextrose, heparin, hydrALAZINE, HYDROcodone-acetaminophen, lactated ringers, levalbuterol, ondansetron (ZOFRAN)  IV, ondansetron (ZOFRAN) IV, oxyCODONE, sodium chloride flush, sodium chloride flush, traMADol   Assessment/Plan:   1. Acute on chronic systolic HF -> cardiogenic shock-> S/p HM3 LVAD - Echo 2016 EF 30-35% - Echo 1/21 EF 25% - Admitted with NYHA IV symptoms and AKI with attempts at diuresis.  - Echo this admit: EF 10-15% moderate RV dysfunction - R/LHC cath 3/21 with severe 3v CAD and low output with CI 1.7.  - Swan placed. Initial co-ox 39%. Initial PAPi 1.5. Started on milrinone 0.25. - Initially planned for CABG but given need for re-do sternotomy, relatively poor targets and longstanding low EF, VAD felt to be better option  - S/p teeth extractions by Dr. Enrique Sack. - S/p HM3 VAD 4/8 - Currently on milrinone 0.375. Should be able to stop epi. MAP elevated.  - CI 3.3. CO-OX 63%.  - Speed turned up on 4/11to  5400, but looking at 4/10 echo there is significant RV dysfunction with leftward septal shift so speed decreased back to 5300 rpm.  - Continue sildenafil 20 mg tid.  - CVP 20. Sluggish urine output. Continue  Lasix to 15 mg/hr. Nephrology consulted for CVVHD.  - LDH 482  - Last dose of coumadin was 4/11.  INR 9 received IV vitamin K this morning. Follow closely.  - Continue ASA to 81 daily.    2. CAD with unstable angina - s/p previous PCI - cath 02/23/2020 with severe 3v CAD - now s/p VAD  3. AKI on CKD 3a - Creatinine continue to worsen -->3.2  -->3.9  - Urine output dropping off. HD cath placed this morning.   - Nephrology consulted.  - May reflect RV failure.  PaPI better on epinephrine, will not increase speed today (decrease back to 5300 rpm).   - Renal US 4/5 unremarkable   4. Permanent AF - now s/p MAZE + LAA Clipping 4/8 - Remains in A fib RVR 110- 120s.  - continue IV amiodarone, 30 mg/hr.  -INR has been supretherapeutic so coumadin on hold   5. 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 DM2, poorly controlled - hgbA1c 11.1% - continue insulin and SSI   7. ID - CXR worsening airspace R>L . Adding xopenex.  Needs IS.  - WBCs 16.  - 4/11 procalcitonin,  1.96  - Started empiric coverage for PNA with cefepime for now given tenuous situation.   - Urine culture obtained 03/24/20  8. Acute Hypoxic Respiratory Failure On Bipap this morning.    Length of Stay: [place] NP_C 03/25/2020, 8:37 AM  Advanced Heart Failure Team Pager [phone] (M-F; 7a - 4p)  Please contact [facility] for night-coverage after hours (4p -7a ) and weekends on [email or web address]  Patient seen with NP, agree with the above note.   Patient is on Bipap this morning.  CVP remains high with pulmonary edema on CXR.  Poor diuresis with creatinine up to 3.98. Bilirubin has not been high but rising INR concerning for liver dysfunction/RV failure.  MAP elevated. LVAD parameters are stable.   General: On Bipap HEENT: Normal. Neck: Supple, JVP 16 cm. Carotids OK.  Cardiac:  Mechanical heart sounds with LVAD hum present.  Lungs:  CTAB, normal effort.  Abdomen:  NT, ND, no HSM. No bruits or masses. +BS  LVAD exit site: Well-healed and incorporated. Dressing dry and intact. No erythema or drainage. Stabilization device present and accurately  applied. Driveline dressing changed daily per sterile technique. Extremities:  Warm and dry. No cyanosis, clubbing, rash. 1+ ankle edema  Neuro:  Follows commands.   I am concerned for significant RV  failure with INR high, poor UOP and significant volume overload despite Lasix gtt 15 mg/hr. He is going to need CVVH for volume control, has HD catheter and have consulted renal.   With elevated MAP, stopping epinephrine today.  Continue milrinone 0.375 and sildenafil for RV support, CI 3.3 with co-ox 63%.   He continues on cefepime for empiric coverage of ?PNA.   He has had 1 mg IV vitamin K for INR 9.  No overt bleeding.   CRITICAL CARE Performed by: Loralie Champagne  Total critical care time: 40 minutes  Critical care time was exclusive of separately billable procedures and treating other patients.  Critical care was necessary to treat or prevent imminent or life-threatening deterioration.  Critical care was time spent personally by me on the following activities: development of treatment plan with patient and/or surrogate as well as nursing, discussions with consultants, evaluation of patient's response to treatment, examination of patient, obtaining history from patient or surrogate, ordering and performing treatments and interventions, ordering and review of laboratory studies, ordering and review of radiographic studies, pulse oximetry and re-evaluation of patient's condition.  Loralie Champagne 03/25/2020 9:03 AM

## 2020-03-25 NOTE — Progress Notes (Signed)
CRITICAL VALUE ALERT  Critical Value: INR 9  Date & Time Notied:  03/25/20 0455  Provider Notified: Cyndia Bent MD  Orders Received/Actions taken: Notified MD of 9 INR, no signs of bleeding noted. CVP remains 17-mid 20s throughout the night. Also notified MD regarding pt's oxygenation despite ventimask, pt refuses to keep it on consistently, diminished lung bases and wheezing. Pt's potassium decreased to 3.2. MD ordered 1 mg Vitamin K IV, 2 potassium runs, and bipap.

## 2020-03-25 NOTE — Progress Notes (Signed)
LVAD Coordinator Rounding Note:  HM III LVAD implanted on 03/14/2020 by Dr Orvan Seen under destination therapy criteria.  Pt lying in bed, difficult to arouse, on bipap. Nurse in the room starting CRRT.   Vital signs: HR: 103 afib Doppler Pressure: 100 Art line: 96/61 (70)  Automatic BP: 114/89(98) O2 Sat: 100% on 50 FiO2 bipap Wt: 253.3>262.3>260.8 lbs   LVAD interrogation reveals:  Speed: 5300 Flow: 4.4 Power: 3.8 PI: 3.6  Alarms: none Events:  none Hematocrit: 27  Fixed speed: 5300 Low speed limit: 5000   Drive Line: Existing VAD dressing removed and site care performed using sterile technique. Drive line exit site cleaned with Chlora prep applicators x 2, allowed to dry, and guaze dressing with silver strip re-applied. Exit site unincorporated, the velour is fully implanted at exit site. 2 sutures intact. No drainage, redness, tenderness, foul odor or rash noted. Drive line anchor re-applied. May advance to every other day dressing change per nurse champion or VAD coordinator. Dressing change due 03/27/20.        Labs:  LDH trend: 360>513>482  INR trend: 1.3>6.2>9  CR: 3.57>3.98  Anticoagulation Plan: -INR Goal: 2.0-2.5 -ASA Dose: 81 mg   Blood Products:  Intraop: 6 FFP  DDAVP  420 cell saver  03/26/2020>>4 FFP 03/21/20>> 1 RBC 03/22/20>>1 RBC  Device: -St [name] -Therapies: VF 194  Drips: Amiodarone 30 mg/hr Epinephrine 1 mcg/min-off 03/25/20 Lasix 15 mg/hr Insulin 2.8 U/HR- off Milrinone 0.375 mcg/kg/min Levophed-- off  Respiratory:  on nitric 5 ppm  Adverse Events on VAD: >>03/25/20 CVVH Started  Pt Education:  1. Pt is difficult to arouse at this time on bipap. Education inappropriate at this time.  2. No family at bedside    Plan/Recommendations:  1. Call VAD coordinator for any equipment or drive line issues.  2. Daily drive line dressing change per nurse champion or VAD coordinator.   Tanda Rockers RN [place] Coordinator  Office: [phone]   24/7 Pager: [phone]

## 2020-03-25 NOTE — Consult Note (Signed)
Reason for Consult: Acute kidney injury on chronic kidney disease stage III, cardiogenic shock Referring Physician: Loralie Champagne, MD (cardiology)  HPI:  69 year old Caucasian man with past medical history significant for hypertension, type 2 diabetes mellitus, dyslipidemia, baseline chronic kidney disease stage III (creatinine 1.4-1.6), coronary artery disease status post CABG, combined systolic/diastolic congestive heart failure (EF 20-25%) who underwent placement of a left ventricular assist device with MAZE procedure and left atrial appendage clipping on 03/21/2011.  He was extubated the next day but has since had poor urine output in response to diuretics/milrinone and now with evidence of impending respiratory failure; on NIPPV.  Nephrology consultation requested for assistance with extracorporeal volume unloading after refractoriness to medical management in the setting of acute kidney injury on chronic kidney disease stage III.  Earlier renal ultrasound done during this hospitalization did not show any hydronephrosis or structural renal lesions.  Urinalysis from yesterday showed SG 1015, dipstick proteinuria and >50 RBC/hpf.  Past Medical History:  Diagnosis Date  . Anxiety   . Arthritis   . CAD (coronary artery disease)    a.  1993 s/p MI - [employer];  b. s/p BMS to LAD '00;  c. PTCA 2nd diagonal 2010;  d. 02/18/12 Cath: moderate nonobs dzs - med rx;  e.  01/2015 Cath: LM nl, LAD 40-23mISR, 70-869m, d1 90p (3.0x16 Synergy DES), D2 50-60, LCX nl, OM1 50p, 8028m.5x12 Synergy DES), RCA nl, EF 30-35%.  . Chronic combined systolic and diastolic CHF (congestive heart failure) ([place]  a. 12/2014 Echo: EF 30-35%, Gr2 DD, mod MR, sev dil LA.  . CKD (chronic kidney disease), stage III   . Depression   . ED (erectile dysfunction)   . GERD (gastroesophageal reflux disease)   . Hyperlipidemia   . Hypertension   . Ischemic cardiomyopathy    a. s/p St. [name] (Atlas) ICD [place];  b. 12/2014 Echo: Ef 30-35%.  . MVP (mitral valve prolapse)    [facility]  . PMarland Kitchenrsistent atrial fibrillation (HCCRoberts  a. noted on ICD interrogation '10 - not previously on OACC[place]ranCHA2DS2VASc = 5.  . Type II diabetes mellitus ([place]  uncontrolled    Past Surgical History:  Procedure Laterality Date  . CARDIAC DEFIBRILLATOR PLACEMENT  2007   [place] 7001 [place] lead and a SJM Atlas ICD followed by Dr KleCaryl Comes CARDIOVERSION N/A 09/29/2016   Procedure: CARDIOVERSION;  Surgeon: BriLelon PerlaD;  [facility];  Service: Cardiovascular;  Laterality: N/A;  . CARDIOVERSION N/A 12/03/2016   Procedure: CARDIOVERSION;  Surgeon: KatDorothy SparkD;  [facility] [place];  Laterality: N/A;  . CLIPPING OF ATRIAL APPENDAGE Left 03/27/2020   Procedure: Clipping Of Atrial Appendage;  Surgeon: AtkWonda OldsD;  [facility] [place];  Laterality: Left;  . IMPLANTABLE CARDIOVERTER DEFIBRILLATOR (ICD) GENERATOR CHANGE N/A 03/19/2013   SJM Fortify ST DR generator placed by Dr TayLovena Leart of Analyze ST study  . INSERTION OF IMPLANTABLE LEFT VENTRICULAR ASSIST DEVICE N/A 03/31/2020   Procedure: INSERTION OF IMPLANTABLE LEFT VENTRICULAR ASSIST DEVICE - HM3;  Surgeon: AtkWonda OldsD;  [facility] [place];  Laterality: N/A;  . LEFT AND RIGHT HEART CATHETERIZATION WITH CORONARY ANGIOGRAM N/A 01/14/2015   Procedure: LEFT AND RIGHT HEART CATHETERIZATION WITH CORONARY ANGIOGRAM;  Surgeon: Peter M JorMartiniqueD;  Location: [place] CATH LAB;  Service: Cardiovascular;  Laterality: N/A;  . LEFT HEART CATHETERIZATION WITH CORONARY ANGIOGRAM N/A 02/17/2012   Procedure: LEFT HEART CATHETERIZATION WITH CORONARY ANGIOGRAM;  Surgeon: Jolaine Artist, MD;  Location: [facility] CATH LAB;  Service: Cardiovascular;  Laterality: N/A;  . MAZE N/A 03/14/2020   Procedure: MAZE;  Surgeon: Wonda Olds, MD;  Location: [place];  Service: Open Heart Surgery;  Laterality: N/A;  . MITRAL VALVE ANNULOPLASTY  2004   Archie Endo 02/17/2012  . MULTIPLE EXTRACTIONS WITH ALVEOLOPLASTY N/A 04/02/2020   Procedure: Extraction of tooth #'s 20,21,22 and 23 with alveoloplasaty;  Surgeon: Lenn Cal, DDS;  Location: [place];  Service: Oral Surgery;  Laterality: N/A;  . RIGHT/LEFT HEART CATH AND CORONARY ANGIOGRAPHY N/A 02/25/2020   Procedure: RIGHT/LEFT HEART CATH AND CORONARY ANGIOGRAPHY;  Surgeon: Burnell Blanks, MD;  Location: [place] CV LAB;  Service: Cardiovascular;  Laterality: N/A;  . TEE WITHOUT CARDIOVERSION N/A 09/29/2016   Procedure: TRANSESOPHAGEAL ECHOCARDIOGRAM (TEE);  Surgeon: Lelon Perla, MD;  Location: [facility] ENDOSCOPY;  Service: Cardiovascular;  Laterality: N/A;  . TEE WITHOUT CARDIOVERSION N/A 12/03/2016   Procedure: TRANSESOPHAGEAL ECHOCARDIOGRAM (TEE);  Surgeon: Dorothy Spark, MD;  Location: [place];  Service: Cardiovascular;  Laterality: N/A;  . TEE WITHOUT CARDIOVERSION N/A 03/28/2020   Procedure: TRANSESOPHAGEAL ECHOCARDIOGRAM (TEE);  Surgeon: Wonda Olds, MD;  Location: [place];  Service: Open Heart Surgery;  Laterality: N/A;    Family History  Problem Relation Age of Onset  . Coronary artery disease Mother   . Colon cancer Mother   . Heart attack Mother   . Heart disease Mother   . Heart failure Mother   . Hypertension Mother   . Malignant hyperthermia Mother   . Colon cancer Sister   . Coronary artery disease Brother   . Heart disease Brother     Social History:  reports that he has never smoked. He has never used smokeless tobacco. He reports current alcohol use. He reports that he does not use drugs.  Allergies:  Allergies  Allergen Reactions  . Liraglutide Nausea And Vomiting  . Lisinopril Cough    Medications:  Scheduled: . sodium chloride   Intravenous Once  . aspirin  81 mg Oral Daily  . atorvastatin  80 mg Oral q1800  .  bisacodyl  10 mg Oral Daily   Or  . bisacodyl  10 mg Rectal Daily  . Chlorhexidine Gluconate Cloth  6 each Topical Daily  . citalopram  20 mg Oral Daily  . colchicine  0.3 mg Oral BID  . docusate sodium  200 mg Oral Daily  . influenza vaccine adjuvanted  0.5 mL Intramuscular Tomorrow-1000  . mouth rinse  15 mL Mouth Rinse BID  . pantoprazole  40 mg Oral Daily  . sildenafil  20 mg Oral TID  . sodium chloride flush  10-40 mL Intracatheter Q12H  . sodium chloride flush  3 mL Intravenous Q12H  . Warfarin - Pharmacist Dosing Inpatient   Does not apply q1600    BMP Latest Ref Rng & Units 03/25/2020 03/25/2020 03/25/2020  Glucose 70 - 99 mg/dL - 178(H) -  BUN 8 - 23 mg/dL - 71(H) -  Creatinine 0.61 - 1.24 mg/dL - 3.98(H) -  BUN/Creat Ratio 10 - 24 - - -  Sodium 135 - 145 mmol/L 131(L) 130(L) 131(L)  Potassium 3.5 - 5.1 mmol/L 3.2(L) 3.5 3.4(L)  Chloride 98 - 111 mmol/L - 94(L) -  CO2 22 -  32 mmol/L - 18(L) -  Calcium 8.9 - 10.3 mg/dL - 8.4(L) -   CBC Latest Ref Rng & Units 03/25/2020 03/25/2020 03/25/2020  WBC 4.0 - 10.5 K/uL - 11.1(H) -  Hemoglobin 13.0 - 17.0 g/dL 9.2(L) 9.1(L) 9.5(L)  Hematocrit 39.0 - 52.0 % 27.0(L) 27.8(L) 28.0(L)  Platelets 150 - 400 K/uL - 143(L) -     DG Chest 1 View  Result Date: 03/24/2020 CLINICAL DATA:  Chest tube, LVAD EXAM: CHEST  1 VIEW COMPARISON:  03/23/2020 FINDINGS: Support devices are stable. Cardiomegaly. Bilateral airspace disease slightly worsened since prior study. This is greater on the right. No effusions or pneumothorax. No acute bony abnormality. IMPRESSION: Worsening bilateral airspace disease, right greater than left could reflect asymmetric edema or infection. Electronically Signed   By: Rolm Baptise M.D.   On: 03/24/2020 08:02   DG Chest Port 1 View  Result Date: 03/25/2020 CLINICAL DATA:  Confusion. Left ventricular assist device present. Areas of airspace consolidation EXAM: PORTABLE CHEST 1 VIEW COMPARISON:  March 24, 2020 FINDINGS:  Central catheter tip is in the main pulmonary outflow tract. Pacemaker leads are attached to the right atrium and right ventricle. There is incomplete visualization of a left ventricular assist device. No pneumothorax. There is widespread airspace opacity throughout the right lung, with most pronounced consolidation in the right base. There is ill-defined airspace opacity in the left base as well, stable. There is cardiomegaly with pulmonary vascularity within normal limits. There is aortic atherosclerosis. No adenopathy. No appreciable bone lesions. IMPRESSION: Multifocal airspace opacity, considerably more on the right than on the left. Suspect multifocal pneumonia, although a degree of pulmonary edema superimposed is quite possible. Both edema and pulmonary edema may be present concurrently. Stable cardiac prominence. Postoperative changes. Aortic Atherosclerosis (ICD10-I70.0). Electronically Signed   By: Lowella Grip III M.D.   On: 03/25/2020 08:57   US Abdomen Limited RUQ  Result Date: 03/24/2020 CLINICAL DATA:  Cholecystitis. EXAM: ULTRASOUND ABDOMEN LIMITED RIGHT UPPER QUADRANT COMPARISON:  CT abdomen and pelvis 03/14/2020 FINDINGS: Examination was limited due to a small acoustic window from external bandage material as well as patient immobility. Gallbladder: Acoustic shadowing throughout the gallbladder likely secondary to the stones demonstrated on prior CT. The gallbladder wall could not be differentiated from the near surface of the stones. 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 No sonographic Murphy sign noted by sonographer. Common bile duct: Diameter: 4 mm Liver: No focal lesion identified. Within normal limits in parenchymal echogenicity. Portal vein is patent on color Doppler imaging with normal direction of blood flow towards the liver. Other: None. IMPRESSION: Cholelithiasis with limited assessment of the  gallbladder wall. No Murphy sign to strongly suggest acute cholecystitis. Electronically Signed   By: Logan Bores M.D.   On: 03/24/2020 11:55    Review of Systems  Unable to perform ROS: Severe respiratory distress (On NIPPV)   Blood pressure (!) 117/94, pulse (!) 110, temperature (!) 96.4 F (35.8 C), resp. rate (!) 32, height 6' (1.829 m), weight 118.3 kg, SpO2 100 %. Physical Exam  Nursing note and vitals reviewed. Constitutional: He appears well-developed and well-nourished. He appears distressed.  HENT:  Head: Normocephalic and atraumatic.  Eyes: Pupils are equal, round, and reactive to light. Conjunctivae are normal.  Neck: JVD present.  14 cm JVP  Cardiovascular: Regular rhythm. Exam reveals no friction rub.  No murmur heard. Regular tachycardia with mechanical precordial hum  Respiratory: He is  in respiratory distress. He has no wheezes. He has rales.  Driveline dressing for LVAD noted  GI: Soft. Bowel sounds are normal. There is no abdominal tenderness. There is no rebound.  Musculoskeletal:        General: No edema.     Cervical back: Normal range of motion and neck supple.  Skin: Skin is warm and dry. There is pallor.    Assessment/Plan: 1.  Acute kidney injury on chronic kidney disease stage III: Likely cardiorenal etiology based on history/timeline of events.  Unfortunately now with worsening respiratory status from volume excess that has been unresponsive to treatment with diuretics.  I will order for CRRT for extracorporeal volume unloading.  We will check urine electrolytes and a repeat urinalysis. 2.  Acute exacerbation of chronic congestive heart failure: With cardiogenic shock-on milrinone and furosemide with unresponsiveness to diuretics noted.  Begin CRRT. 3.  Mitral valve prolapse status post mitral valve repair 4.  Atrial fibrillation status post MAC/LAA clipping 5.  Acute hypoxic respiratory failure: Secondary to worsening congestive heart failure, begin  extracorporeal volume unloading with CRRT.  Ylonda Storr K. 03/25/2020, 9:09 AM

## 2020-03-25 NOTE — Progress Notes (Addendum)
Patient consistently more agitated throughout afternoon.  Attempted to put patient on nasal cannula off BiPAP. After one hour, patient asking for BiPAP back. Once BiPAP back on, patient even more agitated and constantly trying to pull mask and bare hugger off. Patient educated on not taking mask and blanket off. ABG mostly normal. Mitts in place. Atkins MD notified. See new order.

## 2020-03-25 NOTE — Anesthesia Postprocedure Evaluation (Signed)
Anesthesia Post Note  Patient: [name] [name]  Procedure(s) Performed: REDO STERNOTOMY WITH STERNAL PLATING (N/A ) INSERTION OF IMPLANTABLE LEFT VENTRICULAR ASSIST DEVICE - HM3 (N/A Chest) MAZE (N/A ) TRANSESOPHAGEAL ECHOCARDIOGRAM (TEE) (N/A ) Clipping Of Atrial Appendage (Left )     Patient location during evaluation: SICU Anesthesia Type: General Level of consciousness: awake and alert and patient cooperative Pain management: pain level controlled Vital Signs Assessment: post-procedure vital signs reviewed and stable Respiratory status: spontaneous breathing, nonlabored ventilation and respiratory function stable Cardiovascular status: blood pressure returned to baseline Anesthetic complications: no    Last Vitals:  Vitals:   03/25/20 0700 03/25/20 0723  BP: (!) 117/94 (!) 117/94  Pulse: (!) 114 (!) 110  Resp: 13 (!) 32  Temp: (!) 35.8 C (!) 35.8 C  SpO2: 100% 100%    Last Pain:  Vitals:   03/25/20 0400  TempSrc: Core  PainSc: 0-No pain                 Azjah Pardo COKER

## 2020-03-25 NOTE — Plan of Care (Signed)
  Problem: Cardiovascular: Goal: Ability to achieve and maintain adequate cardiovascular perfusion will improve Outcome: Progressing   Problem: Health Behavior/Discharge Planning: Goal: Ability to safely manage health-related needs after discharge will improve Outcome: Progressing   Problem: Education: Goal: Understanding of CV disease, CV risk reduction, and recovery process will improve Outcome: Progressing   Problem: Activity: Goal: Ability to return to baseline activity level will improve Outcome: Progressing   Problem: Cardiovascular: Goal: Ability to achieve and maintain adequate cardiovascular perfusion will improve Outcome: Progressing   Problem: Education: Goal: Knowledge of General Education information will improve Description: Including pain rating scale, medication(s)/side effects and non-pharmacologic comfort measures Outcome: Progressing   Problem: Health Behavior/Discharge Planning: Goal: Ability to manage health-related needs will improve Outcome: Progressing   Problem: Clinical Measurements: Goal: Ability to maintain clinical measurements within normal limits will improve Outcome: Progressing Goal: Will remain free from infection Outcome: Progressing Goal: Diagnostic test results will improve Outcome: Progressing Goal: Respiratory complications will improve Outcome: Progressing Goal: Cardiovascular complication will be avoided Outcome: Progressing   Problem: Activity: Goal: Risk for activity intolerance will decrease Outcome: Progressing   Problem: Nutrition: Goal: Adequate nutrition will be maintained Outcome: Progressing   Problem: Coping: Goal: Level of anxiety will decrease Outcome: Progressing   Problem: Elimination: Goal: Will not experience complications related to bowel motility Outcome: Progressing Goal: Will not experience complications related to urinary retention Outcome: Progressing   Problem: Safety: Goal: Ability to remain  free from injury will improve Outcome: Progressing   Problem: Skin Integrity: Goal: Risk for impaired skin integrity will decrease Outcome: Progressing   Problem: Education: Goal: Knowledge of the prescribed therapeutic regimen will improve Outcome: Progressing   Problem: Activity: Goal: Risk for activity intolerance will decrease Outcome: Progressing   Problem: Cardiac: Goal: Ability to maintain an adequate cardiac output will improve Outcome: Progressing   Problem: Coping: Goal: Level of anxiety will decrease Outcome: Progressing   Problem: Fluid Volume: Goal: Risk for excess fluid volume will decrease Outcome: Progressing   Problem: Clinical Measurements: Goal: Ability to maintain clinical measurements within normal limits will improve Outcome: Progressing Goal: Will remain free from infection Outcome: Progressing   Problem: Respiratory: Goal: Will regain and/or maintain adequate ventilation Outcome: Progressing

## 2020-03-25 NOTE — Progress Notes (Signed)
ANTICOAGULATION CONSULT NOTE  Pharmacy Consult for warfarin Indication: post op LVAD  / Aflutter  Allergies  Allergen Reactions  . Liraglutide Nausea And Vomiting  . Lisinopril Cough    Patient Measurements: Height: 6' (182.9 cm) Weight: 118.3 kg (260 lb 12.9 oz) IBW/kg (Calculated) : 77.6 Heparin Dosing Weight: 101.2 kg  Vital Signs: Temp: 96.4 F (35.8 C) (04/13 0723) Temp Source: Core (04/13 0400) BP: 117/94 (04/13 0723) Pulse Rate: 110 (04/13 0723)  Labs: Recent Labs    03/23/20 0335 03/23/20 0335 03/23/20 1523 03/24/20 0343 03/24/20 0404 03/24/20 0452 03/25/20 0259 03/25/20 0259 03/25/20 0303 03/25/20 0512  HGB 10.5*   < >  --  9.9*   < >  --  9.5*   < > 9.1* 9.2*  HCT 31.7*   < >  --  30.0*   < >  --  28.0*  --  27.8* 27.0*  PLT 139*  --   --  158  --   --   --   --  143*  --   LABPROT 32.5*   < >  --  55.3*  --  55.2*  --   --  73.8*  --   INR 3.2*   < >  --  6.2*  --  6.2*  --   --  9.0*  --   CREATININE 2.88*   < > 3.20* 3.57*  --   --   --   --  3.98*  --    < > = values in this interval not displayed.    Estimated Creatinine Clearance: 22.9 mL/min (A) (by C-G formula based on SCr of 3.98 mg/dL (H)).   Medical History: Past Medical History:  Diagnosis Date  . Anxiety   . Arthritis   . CAD (coronary artery disease)    a.  1993 s/p MI - [employer];  b. s/p BMS to LAD '00;  c. PTCA 2nd diagonal 2010;  d. 02/18/12 Cath: moderate nonobs dzs - med rx;  e.  01/2015 Cath: LM nl, LAD 40-67m ISR, 70-98m/d, d1 90p (3.0x16 Synergy DES), D2 50-60, LCX nl, OM1 50p, 48m (2.5x12 Synergy DES), RCA nl, EF 30-35%.  . Chronic combined systolic and diastolic CHF (congestive heart failure) ([place])    a. 12/2014 Echo: EF 30-35%, Gr2 DD, mod MR, sev dil LA.  . CKD (chronic kidney disease), stage III   . Depression   . ED (erectile dysfunction)   . GERD (gastroesophageal reflux disease)   . Hyperlipidemia   . Hypertension   . Ischemic cardiomyopathy    a. s/p St.  [name] (Atlas) ICD implanted in [place] 2007;  b. 12/2014 Echo: Ef 30-35%.  . MVP (mitral valve prolapse)    a. s/p MV annuloplasty at [facility] 2004.  Marland Kitchen Persistent atrial fibrillation ([place])    a. noted on ICD interrogation '10 - not previously on [place] - CHA2DS2VASc = 5.  . Type II diabetes mellitus ([place])    uncontrolled    Medications:  Medications Prior to Admission  Medication Sig Dispense Refill Last Dose  . ALPRAZolam (XANAX) 0.25 MG tablet Take 0.25 mg by mouth 3 (three) times daily as needed for anxiety.    unknown  . amiodarone (PACERONE) 200 MG tablet Take 200 mg by mouth daily.    03/11/2020 at Unknown time  . apixaban (ELIQUIS) 5 MG TABS tablet Take 1 tablet (5 mg total) by mouth 2 (two) times daily. 60 tablet 1 03/11/2020 at 0600  . atorvastatin (  LIPITOR) 40 MG tablet Take 1 tablet (40 mg total) by mouth daily at 6 PM. 30 tablet 0 03/10/2020 at Unknown time  . citalopram (CELEXA) 20 MG tablet Take 20 mg by mouth daily.    03/11/2020 at Unknown time  . furosemide (LASIX) 40 MG tablet Take 40 mg by mouth daily.    03/11/2020 at Unknown time  . insulin aspart (NOVOLOG FLEXPEN) 100 UNIT/ML FlexPen Inject 6 Units into the skin 3 (three) times daily with meals. 15 mL 0 03/11/2020 at Unknown time  . Insulin Glargine (BASAGLAR KWIKPEN) 100 UNIT/ML SOPN Inject 0.5 mLs (50 Units total) into the skin daily. 15 mL 1 03/11/2020 at Unknown time  . metoprolol succinate (TOPROL-XL) 100 MG 24 hr tablet Take 1 tablet (100 mg total) by mouth daily. 90 tablet 3 03/11/2020 at 0600  . nitroGLYCERIN (NITROSTAT) 0.4 MG SL tablet Place 1 tablet (0.4 mg total) under the tongue every 5 (five) minutes x 3 doses as needed for chest pain. 10 tablet 0 unknown  . pantoprazole (PROTONIX) 40 MG tablet Take 1 tablet (40 mg total) by mouth daily. 30 tablet 1 03/11/2020 at Unknown time  . potassium chloride SA (KLOR-CON) 20 MEQ tablet Take 20 mEq by mouth daily.    03/11/2020 at Unknown time  . tamsulosin (FLOMAX) 0.4 MG CAPS  capsule Take 1 capsule (0.4 mg total) by mouth daily. 30 capsule 0 03/11/2020 at Unknown time  . ALPRAZolam (XANAX) 1 MG tablet Take 1 tablet (1 mg total) by mouth 2 (two) times daily as needed for anxiety. (Patient not taking: Reported on 03/11/2020) 10 tablet 0 Not Taking at Unknown time    Assessment: 77 YOM with atrial fibrillation on Eliquis PTA s/p R & L heart cath found to have 3v disease s/p  LVAD  Implant 4/8. Warfarin initiated on 4/9 after discussing with MD.  INR quick jump today to 9 today. LFTs ok. Tbili down to 1.5. Vitamin K IV 1 mg x 1 this AM.  Hgb down 9.1. Pltc 143. No overt bleeding noted. Urine starting to look red-tinged. Little CT output.   Goal of Therapy:  INR 2-2.5 Heparin level 0.3-0.7 units/ml Monitor platelets by anticoagulation protocol: Yes   Plan:  No Coumadin today Daily PT/INR  Monitor s/s bleeding  Vertis Kelch, PharmD, [facility] PGY2 Cardiology Pharmacy Resident Phone [phone] 03/25/2020       8:40 AM  Please check [email or web address] for unit-specific pharmacist phone numbers

## 2020-03-25 NOTE — Progress Notes (Signed)
5 Days Post-Op Procedure(s) (LRB): REDO STERNOTOMY WITH STERNAL PLATING (N/A) INSERTION OF IMPLANTABLE LEFT VENTRICULAR ASSIST DEVICE - HM3 (N/A) MAZE (N/A) TRANSESOPHAGEAL ECHOCARDIOGRAM (TEE) (N/A) Clipping Of Atrial Appendage (Left) Subjective: No complaints Objective: Vital signs in last 24 hours: Temp:  [94.8 F (34.9 C)-97.7 F (36.5 C)] 96.4 F (35.8 C) (04/13 1900) Pulse Rate:  [63-125] 104 (04/13 1900) Cardiac Rhythm: Atrial fibrillation (04/13 1600) Resp:  [11-34] 20 (04/13 1900) BP: (75-166)/(59-145) 110/75 (04/13 1900) SpO2:  [85 %-100 %] 96 % (04/13 1900) Arterial Line BP: (67-123)/(55-91) 100/72 (04/13 1900) FiO2 (%):  [50 %-60 %] 50 % (04/13 1600) Weight:  [118.3 kg] 118.3 kg (04/13 0317)  Hemodynamic parameters for last 24 hours: PAP: (35-64)/(6-41) 42/16 CVP:  [11 mmHg-33 mmHg] 17 mmHg CO:  [6.3 L/min-9.6 L/min] 7 L/min CI:  [2.8 L/min/m2-4.3 L/min/m2] 3.1 L/min/m2  Intake/Output from previous day: 04/12 0701 - 04/13 0700 In: 2484.7 [P.O.:240; I.V.:1774.7; IV Piggyback:470] Out: 5883 [Urine:1105; Chest Tube:660] Intake/Output this shift: No intake/output data recorded.  General appearance: uncooperative Neurologic: grossly intact, but confused Heart: irregularly irregular rhythm Lungs: diminished breath sounds bilaterally Abdomen: soft, non-tender; bowel sounds normal; no masses,  no organomegaly Extremities: edema 2+ Wound: dressed, dry  Lab Results: Recent Labs    03/24/20 0343 03/24/20 0404 03/25/20 0303 03/25/20 0303 03/25/20 0512 03/25/20 1536  WBC 16.0*  --  11.1*  --   --   --   HGB 9.9*   < > 9.1*   < > 9.2* 9.9*  HCT 30.0*   < > 27.8*   < > 27.0* 29.0*  PLT 158  --  143*  --   --   --    < > = values in this interval not displayed.   BMET:  Recent Labs    03/25/20 0303 03/25/20 0512 03/25/20 1532 03/25/20 1536  NA 130*   < > 134* 132*  K 3.5   < > 4.0 3.9  CL 94*  --  97*  --   CO2 18*  --  18*  --   GLUCOSE 178*  --   150*  --   BUN 71*  --  70*  --   CREATININE 3.98*  --  3.71*  --   CALCIUM 8.4*  --  8.5*  --    < > = values in this interval not displayed.    PT/INR:  Recent Labs    03/25/20 0303  LABPROT 73.8*  INR 9.0*   ABG    Component Value Date/Time   PHART 7.417 03/25/2020 1536   HCO3 19.3 (L) 03/25/2020 1536   TCO2 20 (L) 03/25/2020 1536   ACIDBASEDEF 5.0 (H) 03/25/2020 1536   O2SAT 96.0 03/25/2020 1536   CBG (last 3)  Recent Labs    03/25/20 1010 03/25/20 1200 03/25/20 1533  GLUCAP 112* 113* 152*    Assessment/Plan: S/P Procedure(s) (LRB): REDO STERNOTOMY WITH STERNAL PLATING (N/A) INSERTION OF IMPLANTABLE LEFT VENTRICULAR ASSIST DEVICE - HM3 (N/A) MAZE (N/A) TRANSESOPHAGEAL ECHOCARDIOGRAM (TEE) (N/A) Clipping Of Atrial Appendage (Left) CVVHD  VAD performing well with good cardiac output Needs nutritional support   LOS: 13 days    Wonda Olds 03/25/2020

## 2020-03-25 NOTE — Progress Notes (Signed)
CSW met with patient's daughter at bedside. She states that patient is uncomfortable but was able to recognize her. Daughter plans to visit again tomorrow and states she is hopeful he will be feeling better after having dialysis today. CSW provided supportive intervention and will continue to follow for support throughout implant hospitalization. [name], [place], [place]

## 2020-03-26 ENCOUNTER — Inpatient Hospital Stay (HOSPITAL_COMMUNITY): Payer: Medicare HMO

## 2020-03-26 DIAGNOSIS — I4819 Other persistent atrial fibrillation: Secondary | ICD-10-CM | POA: Diagnosis not present

## 2020-03-26 DIAGNOSIS — N179 Acute kidney failure, unspecified: Secondary | ICD-10-CM | POA: Diagnosis not present

## 2020-03-26 DIAGNOSIS — Z515 Encounter for palliative care: Secondary | ICD-10-CM | POA: Diagnosis not present

## 2020-03-26 DIAGNOSIS — Z95811 Presence of heart assist device: Secondary | ICD-10-CM | POA: Diagnosis not present

## 2020-03-26 DIAGNOSIS — R570 Cardiogenic shock: Secondary | ICD-10-CM | POA: Diagnosis not present

## 2020-03-26 DIAGNOSIS — I255 Ischemic cardiomyopathy: Secondary | ICD-10-CM | POA: Diagnosis not present

## 2020-03-26 LAB — GLUCOSE, CAPILLARY
Glucose-Capillary: 153 mg/dL — ABNORMAL HIGH (ref 70–99)
Glucose-Capillary: 164 mg/dL — ABNORMAL HIGH (ref 70–99)
Glucose-Capillary: 179 mg/dL — ABNORMAL HIGH (ref 70–99)
Glucose-Capillary: 180 mg/dL — ABNORMAL HIGH (ref 70–99)
Glucose-Capillary: 181 mg/dL — ABNORMAL HIGH (ref 70–99)
Glucose-Capillary: 193 mg/dL — ABNORMAL HIGH (ref 70–99)

## 2020-03-26 LAB — RENAL FUNCTION PANEL
Albumin: 2.4 g/dL — ABNORMAL LOW (ref 3.5–5.0)
Albumin: 2.5 g/dL — ABNORMAL LOW (ref 3.5–5.0)
Anion gap: 15 (ref 5–15)
Anion gap: 14 (ref 5–15)
BUN: 57 mg/dL — ABNORMAL HIGH (ref 8–23)
BUN: 49 mg/dL — ABNORMAL HIGH (ref 8–23)
CO2: 20 mmol/L — ABNORMAL LOW (ref 22–32)
CO2: 21 mmol/L — ABNORMAL LOW (ref 22–32)
Calcium: 8.4 mg/dL — ABNORMAL LOW (ref 8.9–10.3)
Calcium: 8.5 mg/dL — ABNORMAL LOW (ref 8.9–10.3)
Chloride: 99 mmol/L (ref 98–111)
Chloride: 100 mmol/L (ref 98–111)
Creatinine, Ser: 3.18 mg/dL — ABNORMAL HIGH (ref 0.61–1.24)
Creatinine, Ser: 2.75 mg/dL — ABNORMAL HIGH (ref 0.61–1.24)
GFR calc Af Amer: 22 mL/min — ABNORMAL LOW (ref >60)
GFR calc Af Amer: 26 mL/min — ABNORMAL LOW (ref >60)
GFR calc non Af Amer: 19 mL/min — ABNORMAL LOW (ref >60)
GFR calc non Af Amer: 22 mL/min — ABNORMAL LOW (ref >60)
Glucose, Bld: 175 mg/dL — ABNORMAL HIGH (ref 70–99)
Glucose, Bld: 184 mg/dL — ABNORMAL HIGH (ref 70–99)
Phosphorus: 4.3 mg/dL (ref 2.5–4.6)
Phosphorus: 3.5 mg/dL (ref 2.5–4.6)
Potassium: 4 mmol/L (ref 3.5–5.1)
Potassium: 4 mmol/L (ref 3.5–5.1)
Sodium: 134 mmol/L — ABNORMAL LOW (ref 135–145)
Sodium: 135 mmol/L (ref 135–145)

## 2020-03-26 LAB — CBC WITH DIFFERENTIAL/PLATELET
Abs Immature Granulocytes: 0.29 10*3/uL — ABNORMAL HIGH (ref 0.00–0.07)
Basophils Absolute: 0 10*3/uL (ref 0.0–0.1)
Basophils Relative: 0 %
Eosinophils Absolute: 0 10*3/uL (ref 0.0–0.5)
Eosinophils Relative: 0 %
HCT: 26.9 % — ABNORMAL LOW (ref 39.0–52.0)
Hemoglobin: 9 g/dL — ABNORMAL LOW (ref 13.0–17.0)
Immature Granulocytes: 4 %
Lymphocytes Relative: 3 %
Lymphs Abs: 0.2 10*3/uL — ABNORMAL LOW (ref 0.7–4.0)
MCH: 30.4 pg (ref 26.0–34.0)
MCHC: 33.5 g/dL (ref 30.0–36.0)
MCV: 90.9 fL (ref 80.0–100.0)
Monocytes Absolute: 0.8 10*3/uL (ref 0.1–1.0)
Monocytes Relative: 12 %
Neutro Abs: 5.5 10*3/uL (ref 1.7–7.7)
Neutrophils Relative %: 81 %
Platelets: 125 10*3/uL — ABNORMAL LOW (ref 150–400)
RBC: 2.96 MIL/uL — ABNORMAL LOW (ref 4.22–5.81)
RDW: 14.8 % (ref 11.5–15.5)
WBC: 6.8 10*3/uL (ref 4.0–10.5)
nRBC: 0.7 % — ABNORMAL HIGH (ref 0.0–0.2)

## 2020-03-26 LAB — POCT I-STAT 7, (LYTES, BLD GAS, ICA,H+H)
Acid-base deficit: 2 mmol/L (ref 0.0–2.0)
Bicarbonate: 21.6 mmol/L (ref 20.0–28.0)
Bicarbonate: 23.3 mmol/L (ref 20.0–28.0)
Calcium, Ion: 1.16 mmol/L (ref 1.15–1.40)
Calcium, Ion: 1.14 mmol/L — ABNORMAL LOW (ref 1.15–1.40)
HCT: 25 % — ABNORMAL LOW (ref 39.0–52.0)
HCT: 29 % — ABNORMAL LOW (ref 39.0–52.0)
Hemoglobin: 8.5 g/dL — ABNORMAL LOW (ref 13.0–17.0)
Hemoglobin: 9.9 g/dL — ABNORMAL LOW (ref 13.0–17.0)
O2 Saturation: 98 %
O2 Saturation: 91 %
Patient temperature: 36.3
Patient temperature: 97.5
Potassium: 4 mmol/L (ref 3.5–5.1)
Potassium: 3.9 mmol/L (ref 3.5–5.1)
Sodium: 133 mmol/L — ABNORMAL LOW (ref 135–145)
Sodium: 135 mmol/L (ref 135–145)
TCO2: 23 mmol/L (ref 22–32)
TCO2: 24 mmol/L (ref 22–32)
pCO2 arterial: 29.7 mmHg — ABNORMAL LOW (ref 32.0–48.0)
pCO2 arterial: 32.2 mmHg (ref 32.0–48.0)
pH, Arterial: 7.466 — ABNORMAL HIGH (ref 7.350–7.450)
pH, Arterial: 7.465 — ABNORMAL HIGH (ref 7.350–7.450)
pO2, Arterial: 96 mmHg (ref 83.0–108.0)
pO2, Arterial: 54 mmHg — ABNORMAL LOW (ref 83.0–108.0)

## 2020-03-26 LAB — MAGNESIUM: Magnesium: 2.5 mg/dL — ABNORMAL HIGH (ref 1.7–2.4)

## 2020-03-26 LAB — PROTIME-INR
INR: 4.4 (ref 0.8–1.2)
Prothrombin Time: 41.8 seconds — ABNORMAL HIGH (ref 11.4–15.2)

## 2020-03-26 LAB — COOXEMETRY PANEL
Carboxyhemoglobin: 1.1 % (ref 0.5–1.5)
Methemoglobin: 0.8 % (ref 0.0–1.5)
O2 Saturation: 68.5 %
Total hemoglobin: 8.6 g/dL — ABNORMAL LOW (ref 12.0–16.0)

## 2020-03-26 LAB — UREA NITROGEN, URINE: Urea Nitrogen, Ur: 216 mg/dL

## 2020-03-26 LAB — LACTATE DEHYDROGENASE: LDH: 467 U/L — ABNORMAL HIGH (ref 98–192)

## 2020-03-26 MED ORDER — SORBITOL 70 % SOLN
15.0000 mL | Freq: Every day | Status: DC | PRN
  Administered 2020-03-26: 15 mL via ORAL
  Filled 2020-03-26: qty 30

## 2020-03-26 MED ORDER — ACETAMINOPHEN 325 MG PO TABS
650.0000 mg | ORAL_TABLET | ORAL | Status: DC | PRN
  Administered 2020-03-28 – 2020-04-19 (×6): 650 mg
  Filled 2020-03-26 (×7): qty 2

## 2020-03-26 MED ORDER — VITAL 1.5 CAL PO LIQD
1000.0000 mL | ORAL | Status: DC
  Administered 2020-03-26 – 2020-03-31 (×6): 1000 mL
  Filled 2020-03-26 (×13): qty 1000

## 2020-03-26 MED ORDER — OXYCODONE HCL 5 MG PO TABS
5.0000 mg | ORAL_TABLET | ORAL | Status: DC | PRN
  Administered 2020-03-29 – 2020-04-15 (×3): 5 mg
  Administered 2020-04-16 – 2020-04-23 (×4): 10 mg
  Filled 2020-03-26 (×2): qty 2
  Filled 2020-03-26 (×2): qty 1
  Filled 2020-03-26: qty 2
  Filled 2020-03-26: qty 1
  Filled 2020-03-26: qty 2

## 2020-03-26 MED ORDER — HYDROCODONE-ACETAMINOPHEN 5-325 MG PO TABS: 1.0000 | ORAL_TABLET | Freq: Four times a day (QID) | ORAL | Status: DC | PRN

## 2020-03-26 MED ORDER — ASPIRIN 81 MG PO CHEW
81.0000 mg | CHEWABLE_TABLET | Freq: Every day | ORAL | Status: DC
  Administered 2020-03-27 – 2020-05-01 (×36): 81 mg
  Filled 2020-03-26 (×37): qty 1

## 2020-03-26 MED ORDER — BISACODYL 5 MG PO TBEC
10.0000 mg | DELAYED_RELEASE_TABLET | Freq: Every day | ORAL | Status: DC
  Administered 2020-03-29 – 2020-04-29 (×13): 10 mg via ORAL
  Filled 2020-03-26 (×17): qty 2

## 2020-03-26 MED ORDER — BISACODYL 10 MG RE SUPP
10.0000 mg | Freq: Every day | RECTAL | Status: DC
  Administered 2020-04-02 – 2020-04-03 (×2): 10 mg via RECTAL
  Filled 2020-03-26 (×2): qty 1

## 2020-03-26 MED ORDER — SILDENAFIL CITRATE 20 MG PO TABS
20.0000 mg | ORAL_TABLET | Freq: Three times a day (TID) | ORAL | Status: DC
  Administered 2020-03-26 – 2020-04-10 (×43): 20 mg
  Filled 2020-03-26 (×45): qty 1

## 2020-03-26 MED ORDER — PRO-STAT SUGAR FREE PO LIQD
30.0000 mL | Freq: Every day | ORAL | Status: DC
  Administered 2020-03-26 – 2020-04-02 (×7): 30 mL
  Filled 2020-03-26 (×8): qty 30

## 2020-03-26 MED ORDER — B COMPLEX-C PO TABS
1.0000 | ORAL_TABLET | Freq: Every day | ORAL | Status: DC
  Administered 2020-03-26 – 2020-04-21 (×27): 1
  Filled 2020-03-26 (×28): qty 1

## 2020-03-26 MED ORDER — ATORVASTATIN CALCIUM 80 MG PO TABS
80.0000 mg | ORAL_TABLET | Freq: Every day | ORAL | Status: DC
  Administered 2020-03-26 – 2020-05-01 (×34): 80 mg
  Filled 2020-03-26 (×37): qty 1

## 2020-03-26 MED ORDER — SILDENAFIL CITRATE 20 MG PO TABS
20.0000 mg | ORAL_TABLET | Freq: Three times a day (TID) | ORAL | Status: DC
  Administered 2020-03-26: 20 mg via ORAL
  Filled 2020-03-26: qty 1

## 2020-03-26 MED ORDER — FENTANYL CITRATE (PF) 100 MCG/2ML IJ SOLN
25.0000 ug | INTRAMUSCULAR | Status: DC | PRN
  Administered 2020-03-26 – 2020-03-28 (×2): 25 ug via INTRAVENOUS
  Administered 2020-03-31: 75 ug via INTRAVENOUS
  Filled 2020-03-26 (×3): qty 2

## 2020-03-26 MED ORDER — TRAMADOL HCL 50 MG PO TABS
50.0000 mg | ORAL_TABLET | ORAL | Status: DC | PRN
  Administered 2020-03-27: 100 mg
  Administered 2020-04-16: 50 mg
  Administered 2020-04-19 – 2020-04-28 (×2): 100 mg
  Filled 2020-03-26: qty 2
  Filled 2020-03-26: qty 1
  Filled 2020-03-26 (×3): qty 2

## 2020-03-26 NOTE — Progress Notes (Signed)
ANTICOAGULATION CONSULT NOTE  Pharmacy Consult for warfarin Indication: post op LVAD  / Aflutter  Allergies  Allergen Reactions  . Liraglutide Nausea And Vomiting  . Lisinopril Cough    Patient Measurements: Height: 6' (182.9 cm) Weight: 117.9 kg (259 lb 14.8 oz) IBW/kg (Calculated) : 77.6 Heparin Dosing Weight: 101.2 kg  Vital Signs: Temp: 97.5 F (36.4 C) (04/14 1200) Temp Source: Axillary (04/14 1200) BP: 125/105 (04/14 1400) Pulse Rate: 106 (04/14 1400)  Labs: Recent Labs    03/24/20 0343 03/24/20 0404 03/24/20 0452 03/25/20 0259 03/25/20 0303 03/25/20 0512 03/25/20 1532 03/25/20 1536 03/26/20 0356 03/26/20 0356 03/26/20 0358 03/26/20 1304  HGB 9.9*   < >  --    < > 9.1*   < >  --    < > 9.0*   < > 8.5* 9.9*  HCT 30.0*   < >  --    < > 27.8*   < >  --    < > 26.9*  --  25.0* 29.0*  PLT 158  --   --   --  143*  --   --   --  125*  --   --   --   LABPROT 55.3*   < > 55.2*  --  73.8*  --   --   --  41.8*  --   --   --   INR 6.2*   < > 6.2*  --  9.0*  --   --   --  4.4*  --   --   --   CREATININE 3.57*   < >  --   --  3.98*  --  3.71*  --  3.18*  --   --   --    < > = values in this interval not displayed.    Estimated Creatinine Clearance: 28.6 mL/min (A) (by C-G formula based on SCr of 3.18 mg/dL (H)).   Medical History: Past Medical History:  Diagnosis Date  . Anxiety   . Arthritis   . CAD (coronary artery disease)    a.  1993 s/p MI - [employer];  b. s/p BMS to LAD '00;  c. PTCA 2nd diagonal 2010;  d. 02/18/12 Cath: moderate nonobs dzs - med rx;  e.  01/2015 Cath: LM nl, LAD 40-5m ISR, 70-13m/d, d1 90p (3.0x16 Synergy DES), D2 50-60, LCX nl, OM1 50p, 49m (2.5x12 Synergy DES), RCA nl, EF 30-35%.  . Chronic combined systolic and diastolic CHF (congestive heart failure) ([place])    a. 12/2014 Echo: EF 30-35%, Gr2 DD, mod MR, sev dil LA.  . CKD (chronic kidney disease), stage III   . Depression   . ED (erectile dysfunction)   . GERD (gastroesophageal  reflux disease)   . Hyperlipidemia   . Hypertension   . Ischemic cardiomyopathy    a. s/p St. [name] (Atlas) ICD implanted in [place] 2007;  b. 12/2014 Echo: Ef 30-35%.  . MVP (mitral valve prolapse)    a. s/p MV annuloplasty at [facility] 2004.  Marland Kitchen Persistent atrial fibrillation ([place])    a. noted on ICD interrogation '10 - not previously on [place] - CHA2DS2VASc = 5.  . Type II diabetes mellitus ([place])    uncontrolled    Medications:  Medications Prior to Admission  Medication Sig Dispense Refill Last Dose  . ALPRAZolam (XANAX) 0.25 MG tablet Take 0.25 mg by mouth 3 (three) times daily as needed for anxiety.    unknown  . amiodarone (PACERONE) 200  MG tablet Take 200 mg by mouth daily.    03/11/2020 at Unknown time  . apixaban (ELIQUIS) 5 MG TABS tablet Take 1 tablet (5 mg total) by mouth 2 (two) times daily. 60 tablet 1 03/11/2020 at 0600  . atorvastatin (LIPITOR) 40 MG tablet Take 1 tablet (40 mg total) by mouth daily at 6 PM. 30 tablet 0 03/10/2020 at Unknown time  . citalopram (CELEXA) 20 MG tablet Take 20 mg by mouth daily.    03/11/2020 at Unknown time  . furosemide (LASIX) 40 MG tablet Take 40 mg by mouth daily.    03/11/2020 at Unknown time  . insulin aspart (NOVOLOG FLEXPEN) 100 UNIT/ML FlexPen Inject 6 Units into the skin 3 (three) times daily with meals. 15 mL 0 03/11/2020 at Unknown time  . Insulin Glargine (BASAGLAR KWIKPEN) 100 UNIT/ML SOPN Inject 0.5 mLs (50 Units total) into the skin daily. 15 mL 1 03/11/2020 at Unknown time  . metoprolol succinate (TOPROL-XL) 100 MG 24 hr tablet Take 1 tablet (100 mg total) by mouth daily. 90 tablet 3 03/11/2020 at 0600  . nitroGLYCERIN (NITROSTAT) 0.4 MG SL tablet Place 1 tablet (0.4 mg total) under the tongue every 5 (five) minutes x 3 doses as needed for chest pain. 10 tablet 0 unknown  . pantoprazole (PROTONIX) 40 MG tablet Take 1 tablet (40 mg total) by mouth daily. 30 tablet 1 03/11/2020 at Unknown time  . potassium chloride SA (KLOR-CON) 20 MEQ  tablet Take 20 mEq by mouth daily.    03/11/2020 at Unknown time  . tamsulosin (FLOMAX) 0.4 MG CAPS capsule Take 1 capsule (0.4 mg total) by mouth daily. 30 capsule 0 03/11/2020 at Unknown time  . ALPRAZolam (XANAX) 1 MG tablet Take 1 tablet (1 mg total) by mouth 2 (two) times daily as needed for anxiety. (Patient not taking: Reported on 02/20/2020) 10 tablet 0 Not Taking at Unknown time    Assessment: 38 YOM with atrial fibrillation on Eliquis PTA s/p R & L heart cath found to have 3v disease s/p  LVAD  Implant 4/8. Warfarin initiated on 4/9 after discussing with MD.  INR is down to 4.4 today after receiving vitamin K IV 1 mg yesterday. LFTs ok. Hgb 8.5, hct 25, pltc 467. No bleeding noted   Goal of Therapy:  INR 2-2.5 Heparin level 0.3-0.7 units/ml Monitor platelets by anticoagulation protocol: Yes   Plan:  No warfarin today Daily PT/INR  Monitor s/s bleeding  Vertis Kelch, PharmD, [facility] PGY2 Cardiology Pharmacy Resident Phone [phone] 03/26/2020       2:22 PM  Please check [email or web address] for unit-specific pharmacist phone numbers

## 2020-03-26 NOTE — Progress Notes (Signed)
LVAD Coordinator Rounding Note:  HM III LVAD implanted on 03/31/2020 by Dr Orvan Seen under destination therapy criteria.  Pt lying in bed, difficult to arouse, on bipap. Precedex stopped this morning. CVVH ongoing-pulling 150/hr.  Vital signs: HR: 110 afib Doppler Pressure: 110 Art line: 110/78 (86)  Automatic BP: 110/82 (93) O2 Sat: 100% on 50 FiO2 bipap Wt: 253.3>262.3>260.8>259.9 lbs   LVAD interrogation reveals:  Speed: 5300 Flow: 4.1 Power: 3.7 PI: 4.6  Alarms: none Events:  3 PI today Hematocrit: 25  Fixed speed: 5300 Low speed limit: 5000   Drive Line: CDI. Every other day dressing change per nurse champion or VAD coordinator. Dressing change due 03/27/20.    Labs:  LDH trend: [phone]  INR trend: 1.3>6.2>9>4.4  CR: 3.57>3.98>3.18  Anticoagulation Plan: -INR Goal: 2.0-2.5 -ASA Dose: 81 mg   Blood Products:  Intraop: 6 FFP  DDAVP  420 cell saver  04/03/2020>>4 FFP 03/21/20>> 1 RBC 03/22/20>>1 RBC  Device: -St [name] -Therapies: VF 194  Drips: Amiodarone 30 mg/hr Epinephrine 1 mcg/min-off 03/25/20 Lasix 15 mg/hr- off 03/25/20 Insulin 2.8 U/HR- off Milrinone 0.375 mcg/kg/min Levophed-- off  Respiratory:  on nitric 5 ppm- off today  Adverse Events on VAD: >>03/25/20 CVVH Started  Pt Education:  1. Pt is difficult to arouse at this time on bipap. Education inappropriate at this time.  2. No family at bedside    Plan/Recommendations:  1. Call VAD coordinator for any equipment or drive line issues.  2. Every other day drive line dressing change per nurse champion or VAD coordinator.   Tanda Rockers RN [place] Coordinator  Office: [phone]  24/7 Pager: [phone]

## 2020-03-26 NOTE — Plan of Care (Signed)
  Problem: Cardiovascular: Goal: Ability to achieve and maintain adequate cardiovascular perfusion will improve Outcome: Progressing   Problem: Health Behavior/Discharge Planning: Goal: Ability to safely manage health-related needs after discharge will improve Outcome: Progressing   Problem: Education: Goal: Understanding of CV disease, CV risk reduction, and recovery process will improve Outcome: Progressing   Problem: Activity: Goal: Ability to return to baseline activity level will improve Outcome: Progressing   Problem: Cardiovascular: Goal: Ability to achieve and maintain adequate cardiovascular perfusion will improve Outcome: Progressing   Problem: Education: Goal: Knowledge of General Education information will improve Description: Including pain rating scale, medication(s)/side effects and non-pharmacologic comfort measures Outcome: Progressing   Problem: Health Behavior/Discharge Planning: Goal: Ability to manage health-related needs will improve Outcome: Progressing   Problem: Clinical Measurements: Goal: Ability to maintain clinical measurements within normal limits will improve Outcome: Progressing Goal: Will remain free from infection Outcome: Progressing Goal: Diagnostic test results will improve Outcome: Progressing Goal: Respiratory complications will improve Outcome: Progressing Goal: Cardiovascular complication will be avoided Outcome: Progressing   Problem: Activity: Goal: Risk for activity intolerance will decrease Outcome: Progressing   Problem: Nutrition: Goal: Adequate nutrition will be maintained Outcome: Progressing   Problem: Coping: Goal: Level of anxiety will decrease Outcome: Progressing   Problem: Elimination: Goal: Will not experience complications related to bowel motility Outcome: Progressing Goal: Will not experience complications related to urinary retention Outcome: Progressing   Problem: Safety: Goal: Ability to remain  free from injury will improve Outcome: Progressing   Problem: Skin Integrity: Goal: Risk for impaired skin integrity will decrease Outcome: Progressing   Problem: Education: Goal: Knowledge of the prescribed therapeutic regimen will improve Outcome: Progressing   Problem: Activity: Goal: Risk for activity intolerance will decrease Outcome: Progressing   Problem: Cardiac: Goal: Ability to maintain an adequate cardiac output will improve Outcome: Progressing   Problem: Coping: Goal: Level of anxiety will decrease Outcome: Progressing   Problem: Fluid Volume: Goal: Risk for excess fluid volume will decrease Outcome: Progressing   Problem: Clinical Measurements: Goal: Ability to maintain clinical measurements within normal limits will improve Outcome: Progressing Goal: Will remain free from infection Outcome: Progressing   Problem: Respiratory: Goal: Will regain and/or maintain adequate ventilation Outcome: Progressing

## 2020-03-26 NOTE — Progress Notes (Signed)
EVENING ROUNDS NOTE :     [address]       [place] [address]             [phone]                 6 Days Post-Op Procedure(s) (LRB): REDO STERNOTOMY WITH STERNAL PLATING (N/A) INSERTION OF IMPLANTABLE LEFT VENTRICULAR ASSIST DEVICE - HM3 (N/A) MAZE (N/A) TRANSESOPHAGEAL ECHOCARDIOGRAM (TEE) (N/A) Clipping Of Atrial Appendage (Left)   Total Length of Stay:  LOS: 14 days  Events:  No events Pulling fluid on CRRT     BP 120/82   Pulse (!) 104   Temp (!) 96.8 F (36 C) (Axillary)   Resp 19   Ht 6' (1.829 m)   Wt 117.9 kg   SpO2 95%   BMI 35.25 kg/m   PAP: (20-50)/(1-27) 35/4 CVP:  [3 mmHg-25 mmHg] 18 mmHg CO:  [7.1 L/min] 7.1 L/min CI:  [3.2 L/min/m2] 3.2 L/min/m2  Vent Mode: BIPAP;PCV FiO2 (%):  [50 %] 50 % Set Rate:  [16 bmp] 16 bmp PEEP:  [5 cmH20] 5 cmH20  .  prismasol BGK 4/2.5 500 mL/hr at 03/26/20 1606  .  prismasol BGK 4/2.5 200 mL/hr at 03/26/20 1130  . sodium chloride Stopped (03/22/20 2011)  . sodium chloride Stopped (03/26/20 1325)  . sodium chloride    . sodium chloride 20 mL/hr at 03/25/20 2000  . sodium chloride    . amiodarone 30 mg/hr (03/26/20 1700)  . ceFEPime (MAXIPIME) IV Stopped (03/26/20 0948)  . dexmedetomidine (PRECEDEX) IV infusion 0.2 mcg/kg/hr (03/26/20 1700)  . feeding supplement (VITAL 1.5 CAL) 1,000 mL (03/26/20 1300)  . lactated ringers    . lactated ringers 10 mL/hr at 03/26/20 1700  . milrinone 0.375 mcg/kg/min (03/26/20 1700)  . norepinephrine (LEVOPHED) Adult infusion Stopped (03/21/20 1049)  . prismasol BGK 4/2.5 1,500 mL/hr at 03/26/20 1316  . vancomycin Stopped (03/26/20 1311)    I/O last 3 completed shifts: In: 3225.9 [I.V.:2675.5; IV Piggyback:550.4] Out: 7124 [Urine:915; PYKDX:8338; Chest Tube:440]   CBC Latest Ref Rng & Units 03/26/2020 03/26/2020 03/26/2020  WBC 4.0 - 10.5 K/uL - - 6.8  Hemoglobin 13.0 - 17.0 g/dL 9.9(L) 8.5(L) 9.0(L)  Hematocrit 39.0 - 52.0 % 29.0(L) 25.0(L) 26.9(L)   Platelets 150 - 400 K/uL - - 125(L)    BMP Latest Ref Rng & Units 03/26/2020 03/26/2020 03/26/2020  Glucose 70 - 99 mg/dL 184(H) - -  BUN 8 - 23 mg/dL 49(H) - -  Creatinine 0.61 - 1.24 mg/dL 2.75(H) - -  BUN/Creat Ratio 10 - 24 - - -  Sodium 135 - 145 mmol/L 135 135 133(L)  Potassium 3.5 - 5.1 mmol/L 4.0 3.9 4.0  Chloride 98 - 111 mmol/L 100 - -  CO2 22 - 32 mmol/L 21(L) - -  Calcium 8.9 - 10.3 mg/dL 8.5(L) - -    ABG    Component Value Date/Time   PHART 7.465 (H) 03/26/2020 1304   PCO2ART 32.2 03/26/2020 1304   PO2ART 54.0 (L) 03/26/2020 1304   HCO3 23.3 03/26/2020 1304   TCO2 24 03/26/2020 1304   ACIDBASEDEF 2.0 03/26/2020 0358   O2SAT 91.0 03/26/2020 [place], MD 03/26/2020 5:26 PM

## 2020-03-26 NOTE — Progress Notes (Signed)
OT Cancellation Note  Patient Details Name: [name] [name] MRN: [id number] DOB: 1951/10/23   Cancelled Treatment:    Reason Eval/Treat Not Completed: Medical issues which prohibited therapy(Pt continues to be on BiPap) RN hopeful that Pt will be appropriate for EOB/OOB tomorrow, OT will hold another day.   [place] 03/26/2020, 9:16 AM   [name] OTR/L Acute Rehabilitation Services Pager: [phone] Office: [phone]

## 2020-03-26 NOTE — Progress Notes (Signed)
Nutrition Follow-up  DOCUMENTATION CODES:   Not applicable  INTERVENTION:   Tube Feeding: Vital 1.5 at 60 ml/hr Pro-Stat 30 mL daily Provides 2260 kcals, 112 g of protein and 1094 mL of free water Meets 100% estimated calorie and protein needs  Add B-complex with C to replace losses while on CRRT   NUTRITION DIAGNOSIS:   Increased nutrient needs related to post-op healing, chronic illness as evidenced by estimated needs.  Being addressed via TF    GOAL:   Patient will meet greater than or equal to 90% of their needs  Progressing  MONITOR:   PO intake, Supplement acceptance, Labs, Weight trends  REASON FOR ASSESSMENT:   Consult LVAD Eval  ASSESSMENT:   69 yo male admitted with acute on chronic CHF, AKI on CKD 3. Initial plan for CABG but with re-do sternotomy, poor targets and longstanding low EF plan for LVAD instead. PMH includes CAD, CKD, DM  3/31 Admit, Cath with severe 3v CAD 4/03 Swan placed  4/06 Teeth extraction 4/08 HeartMate 3 LVAD placed, re-do sternotomy, Intubated 4/09 Extubated 4/13 CRRT initiated  Pt is very weak. Currently on BiPap. Remains on CRRT NPO, Cortrak to be placed today  Pt with good appetite prior to LVAD placement on 4/08 but minimal nutrition since  Weight down to 103 kg pre-op. Current wt 117.9 kg.   Labs: sodium 133` Meds: reviewed  Diet Order:   Diet Order            Diet NPO time specified  Diet effective now              EDUCATION NEEDS:   Education needs have been addressed  Skin:  Skin Assessment: Reviewed RN Assessment  Last BM:  4/14  Height:   Ht Readings from Last 1 Encounters:  03/22/20 6' (1.829 m)    Weight:   Wt Readings from Last 1 Encounters:  03/26/20 117.9 kg    BMI:  Body mass index is 35.25 kg/m.  Estimated Nutritional Needs:   Kcal:  2100-2300 kcals  Protein:  110-125 g  Fluid:  1500-2000 mL    Kerman Passey MS, RDN, LDN, CNSC RD Pager Number and Weekend/On-Call After  Hours Pager Located in [place]

## 2020-03-26 NOTE — Progress Notes (Signed)
Patient ID: [name] [name], male   DOB: April 06, 1951, 69 y.o.   MRN: [id number] [facility] Progress Note   Assessment/ Plan:   1.  Acute kidney injury on chronic kidney disease stage III: Oliguric and suspected to be of cardiorenal etiology based on history/timeline of events.    Started on CRRT yesterday with worsening azotemia and volume overload compromising respiratory status; overnight with significant ultrafiltration with hemodialysis with net -3.4 L balance (CVP at this time 7-8) and will continue efforts at ultrafiltration while limiting hypotension. 2.  Acute exacerbation of chronic congestive heart failure: With cardiogenic shock-on milrinone and now off CRRT for extracorporeal volume unloading after failure to respond to diuretics. 3.  Mitral valve prolapse status post mitral valve repair 4.  Atrial fibrillation status post MAC/LAA clipping 5.  Acute hypoxic respiratory failure: Secondary to worsening congestive heart failure, remains on BiPAP.  Continue volume unloading with CRRT.  Subjective:   Without acute events overnight, tolerating CRRT without problems with mild hypotension noted   Objective:   BP (!) 80/57   Pulse (!) 142   Temp (!) 97 F (36.1 C)   Resp 14   Ht 6' (1.829 m)   Wt 117.9 kg   SpO2 100%   BMI 35.25 kg/m   Intake/Output Summary (Last 24 hours) at 03/26/2020 0800 Last data filed at 03/26/2020 0700 Gross per 24 hour  Intake 2127.04 ml  Output 5563 ml  Net -3435.96 ml   Weight change: -0.4 kg  Physical Exam: Gen: On BiPAP, somnolent CVS: Pulse irregular tachycardia, S1 and S2 normal Resp: Anteriorly clear to auscultation, no rales/rhonchi.  LVAD driveline dressing noted. Abd: Soft, flat, nontender Ext: No lower extremity edema  Imaging: DG CHEST PORT 1 VIEW  Result Date: 03/25/2020 CLINICAL DATA:  Post hemodialysis catheter insertion. EXAM: PORTABLE CHEST 1 VIEW COMPARISON:  03/25/2020 FINDINGS: Left-sided pacemaker and left  ventricular assist device unchanged. Right IJ Swan-Ganz catheter with tip over the main pulmonary artery segment unchanged. Interval placement of right subclavian central venous catheter with tip at the cavoatrial junction. Catheter from below with tip just right of the Swan-Ganz catheter tip in the main pulmonary artery segment unchanged. Lungs are adequately inflated demonstrate persistent hazy bilateral perihilar opacification with interval improvement. Persistent bibasilar opacification without significant change. Pneumothorax. Stable cardiomegaly. Remainder of the exam is unchanged. IMPRESSION: 1. Stable cardiomegaly with interval improvement and hazy bilateral perihilar opacification. Stable bibasilar airspace density. 2. Tubes and lines as described. New right subclavian central venous catheter with tip over the SVC. No pneumothorax. Electronically Signed   By: Marin Olp M.D.   On: 03/25/2020 09:20   DG Chest Port 1 View  Result Date: 03/25/2020 CLINICAL DATA:  Confusion. Left ventricular assist device present. Areas of airspace consolidation EXAM: PORTABLE CHEST 1 VIEW COMPARISON:  March 24, 2020 FINDINGS: Central catheter tip is in the main pulmonary outflow tract. Pacemaker leads are attached to the right atrium and right ventricle. There is incomplete visualization of a left ventricular assist device. No pneumothorax. There is widespread airspace opacity throughout the right lung, with most pronounced consolidation in the right base. There is ill-defined airspace opacity in the left base as well, stable. There is cardiomegaly with pulmonary vascularity within normal limits. There is aortic atherosclerosis. No adenopathy. No appreciable bone lesions. IMPRESSION: Multifocal airspace opacity, considerably more on the right than on the left. Suspect multifocal pneumonia, although a degree of pulmonary edema superimposed is quite possible. Both edema and pulmonary edema may  be present concurrently.  Stable cardiac prominence. Postoperative changes. Aortic Atherosclerosis (ICD10-I70.0). Electronically Signed   By: Lowella Grip III M.D.   On: 03/25/2020 08:57   US Abdomen Limited RUQ  Result Date: 03/24/2020 CLINICAL DATA:  Cholecystitis. EXAM: ULTRASOUND ABDOMEN LIMITED RIGHT UPPER QUADRANT COMPARISON:  CT abdomen and pelvis 03/21/2020 FINDINGS: Examination was limited due to a small acoustic window from external bandage material as well as patient immobility. Gallbladder: Acoustic shadowing throughout the gallbladder likely secondary to the stones demonstrated on prior CT. The gallbladder wall could not be differentiated from the near surface of the stones. 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 No sonographic Murphy sign noted by sonographer. Common bile duct: Diameter: 4 mm Liver: No focal lesion identified. Within normal limits in parenchymal echogenicity. Portal vein is patent on color Doppler imaging with normal direction of blood flow towards the liver. Other: None. IMPRESSION: Cholelithiasis with limited assessment of the gallbladder wall. No Murphy sign to strongly suggest acute cholecystitis. Electronically Signed   By: Logan Bores M.D.   On: 03/24/2020 11:55    Labs: [employer] Recent Labs  Lab 03/21/20 0209 03/21/20 0222 03/22/20 0307 03/22/20 0413 03/22/20 1454 03/23/20 0326 03/23/20 0335 03/23/20 0335 03/23/20 1523 03/23/20 1523 03/24/20 0343 03/24/20 0404 03/25/20 0259 03/25/20 0303 03/25/20 0512 03/25/20 1532 03/25/20 1536 03/26/20 0356 03/26/20 0358  NA 137   < > 136   < > 136   < > 134*   < > 135   < > 134*   < > 131* 130* 131* 134* 132* 134* 133*  K 4.2   < > 3.6   < > 3.7   < > 3.5   < > 3.4*   < > 3.4*   < > 3.4* 3.5 3.2* 4.0 3.9 4.0 4.0  CL 102   < > 101   < > 102  --  98  --  99  --  97*  --   --  94*  --  97*  --  99  --   CO2 22   < > 21*   < > 21*  --  21*  --  20*  --  20*   --   --  18*  --  18*  --  20*  --   GLUCOSE 184*   < > 145*   < > 134*  --  116*  --  118*  --  108*  --   --  178*  --  150*  --  175*  --   BUN 21   < > 30*   < > 36*  --  40*  --  45*  --  54*  --   --  71*  --  70*  --  57*  --   CREATININE 1.68*   < > 2.23*   < > 2.60*  --  2.88*  --  3.20*  --  3.57*  --   --  3.98*  --  3.71*  --  3.18*  --   CALCIUM 8.8*   < > 8.5*   < > 9.0  --  8.9  --  8.4*  --  8.6*  --   --  8.4*  --  8.5*  --  8.4*  --   PHOS 3.9  --  4.9*  --   --   --  5.7*  --   --   --  5.6*  --   --  5.4*  --  5.1*  --  4.3  --    < > = values in this interval not displayed.   CBC Recent Labs  Lab 03/23/20 0335 03/23/20 0335 03/24/20 0343 03/24/20 0404 03/25/20 0303 03/25/20 0303 03/25/20 0512 03/25/20 1536 03/26/20 0356 03/26/20 0358  WBC 17.8*  --  16.0*  --  11.1*  --   --   --  6.8  --   NEUTROABS 16.0*  --  14.1*  --  9.2*  --   --   --  5.5  --   HGB 10.5*   < > 9.9*   < > 9.1*   < > 9.2* 9.9* 9.0* 8.5*  HCT 31.7*   < > 30.0*   < > 27.8*   < > 27.0* 29.0* 26.9* 25.0*  MCV 92.2  --  91.7  --  90.8  --   --   --  90.9  --   PLT 139*  --  158  --  143*  --   --   --  125*  --    < > = values in this interval not displayed.    Medications:    . sodium chloride   Intravenous Once  . aspirin  81 mg Oral Daily  . atorvastatin  80 mg Oral q1800  . bisacodyl  10 mg Oral Daily   Or  . bisacodyl  10 mg Rectal Daily  . chlorhexidine  15 mL Mouth Rinse BID  . Chlorhexidine Gluconate Cloth  6 each Topical Daily  . docusate sodium  200 mg Oral Daily  . influenza vaccine adjuvanted  0.5 mL Intramuscular Tomorrow-1000  . insulin aspart  0-24 Units Subcutaneous Q4H  . mouth rinse  15 mL Mouth Rinse BID  . mouth rinse  15 mL Mouth Rinse q12n4p  . pantoprazole (PROTONIX) IV  40 mg Intravenous Daily  . sodium chloride flush  10-40 mL Intracatheter Q12H  . sodium chloride flush  3 mL Intravenous Q12H  . Warfarin - Pharmacist Dosing Inpatient   Does not apply q1600    Elmarie Shiley, MD 03/26/2020, 8:00 AM

## 2020-03-26 NOTE — Progress Notes (Signed)
CRITICAL VALUE ALERT  Critical Value:  INR 4.4  Date & Time Notied:  03/26/2020@0617   Provider Notified: Darrick Grinder, NP @ [phone]  Orders Received/Actions taken: not at this time

## 2020-03-26 NOTE — Progress Notes (Addendum)
Patient ID: [name] [name], male   DOB: 1950-12-29, 69 y.o.   MRN: [id number]    Advanced Heart Failure Rounding Note   Subjective:    - S/p HM3 VAD 4/8 w MAZE procedure + LAA clipping. - Extubated 4/9 - 4/13 CVVHD   LVAD speed increased to 5400 earlier --> echo moderate-severe RV dysfunction, septum mildly left shifted, trivial pericardial effusion.  LVAD speed cut back to 5300.   He is currently on milrinone 0.375, NO 5 ppm, amiodarone 30 mg/hr.    INR 4 today.   Remains on bipap. Drowsy but precedex turned off this morning.   RUQ [place]: cholelithiasis without definite evidence for cholecystitis  Swan #s PA 33/5 CVP 7-8 Thermo CI  3.2  Co-ox 68.5  LDH 467  VAD Interrogation  Flow 5, Speed 5300, Power 4, Pulse Index 2. Rare PI events.   Objective:   Weight Range:  Vital Signs:   Temp:  [94.8 F (34.9 C)-97.9 F (36.6 C)] 97 F (36.1 C) (04/14 0645) Pulse Rate:  [30-145] 142 (04/14 0645) Resp:  [11-35] 14 (04/14 0645) BP: (75-128)/(57-114) 80/57 (04/14 0600) SpO2:  [89 %-100 %] 100 % (04/14 0645) Arterial Line BP: (58-114)/(45-78) 71/59 (04/14 0645) FiO2 (%):  [50 %] 50 % (04/14 0319) Weight:  [117.9 kg] 117.9 kg (04/14 0424) Last BM Date: 03/19/2020  Weight change: Filed Weights   03/24/20 0345 03/25/20 0317 03/26/20 0424  Weight: 119 kg 118.3 kg 117.9 kg    Intake/Output:   Intake/Output Summary (Last 24 hours) at 03/26/2020 0738 Last data filed at 03/26/2020 0700 Gross per 24 hour  Intake 2253.91 ml  Output 5668 ml  Net -3414.09 ml    CVP 7-8   Physical Exam: General:  On bipap.  No resp difficulty HEENT: normal Neck: supple. JVP 7-8. Carotids 2+ bilat; no bruits. No lymphadenopathy or thryomegaly appreciated. RIJ Cor: PMI nondisplaced. Irregular rate & rhythm. No rubs, gallops or murmurs. Lungs: Decreased in the bases.  Abdomen: soft, nontender, nondistended. No hepatosplenomegaly. No bruits or masses. Good bowel sounds. Extremities: no  cyanosis, clubbing, rash, edema Neuro:Drowsy. MAE x4.   Telemetry:  A fib 90s -100s   Labs: Basic Metabolic Panel: Recent Labs  Lab 03/22/20 0307 03/22/20 0413 03/23/20 0335 03/23/20 0335 03/23/20 1523 03/23/20 1523 03/24/20 0343 03/24/20 0404 03/25/20 0303 03/25/20 0303 03/25/20 9509 03/25/20 1532 03/25/20 1536 03/26/20 0356 03/26/20 0358  NA 136   < > 134*   < > 135   < > 134*   < > 130*   < > 131* 134* 132* 134* 133*  K 3.6   < > 3.5   < > 3.4*   < > 3.4*   < > 3.5   < > 3.2* 4.0 3.9 4.0 4.0  CL 101   < > 98   < > 99  --  97*  --  94*  --   --  97*  --  99  --   CO2 21*   < > 21*   < > 20*  --  20*  --  18*  --   --  18*  --  20*  --   GLUCOSE 145*   < > 116*   < > 118*  --  108*  --  178*  --   --  150*  --  175*  --   BUN 30*   < > 40*   < > 45*  --  54*  --  71*  --   --  70*  --  57*  --   CREATININE 2.23*   < > 2.88*   < > 3.20*  --  3.57*  --  3.98*  --   --  3.71*  --  3.18*  --   CALCIUM 8.5*   < > 8.9   < > 8.4*   < > 8.6*   < > 8.4*  --   --  8.5*  --  8.4*  --   MG 2.1  --  2.1  --   --   --  2.2  --  2.3  --   --   --   --  2.5*  --   PHOS 4.9*   < > 5.7*  --   --   --  5.6*  --  5.4*  --   --  5.1*  --  4.3  --    < > = values in this interval not displayed.    Liver Function Tests: Recent Labs  Lab 03/22/20 0307 03/22/20 0307 03/22/20 1454 03/22/20 1454 03/23/20 0335 03/23/20 1523 03/24/20 0343 03/25/20 1532 03/26/20 0356  AST 99*  --  90*  --  70* 64* 62*  --   --   ALT 26  --  27  --  24 24 26   --   --   ALKPHOS 59  --  67  --  78 74 72  --   --   BILITOT 2.2*  --  2.4*  --  2.0* 2.2* 1.5*  --   --   PROT 5.6*  --  6.0*  --  5.9* 5.9* 6.1*  --   --   ALBUMIN 3.0*   < > 3.2*   < > 3.0* 2.9* 3.0* 2.7* 2.4*   < > = values in this interval not displayed.   No results for input(s): LIPASE, AMYLASE in the last 168 hours. Recent Labs  Lab 03/24/20 0719  AMMONIA 32    CBC: Recent Labs  Lab 03/22/20 0307 03/22/20 0413 03/23/20 0335  03/23/20 0335 03/24/20 0343 03/24/20 0404 03/25/20 0303 03/25/20 0512 03/25/20 1536 03/26/20 0356 03/26/20 0358  WBC 12.0*  --  17.8*  --  16.0*  --  11.1*  --   --  6.8  --   NEUTROABS 10.8*  --  16.0*  --  14.1*  --  9.2*  --   --  5.5  --   HGB 9.2*   < > 10.5*   < > 9.9*   < > 9.1* 9.2* 9.9* 9.0* 8.5*  HCT 28.6*   < > 31.7*   < > 30.0*   < > 27.8* 27.0* 29.0* 26.9* 25.0*  MCV 93.5  --  92.2  --  91.7  --  90.8  --   --  90.9  --   PLT 92*  --  139*  --  158  --  143*  --   --  125*  --    < > = values in this interval not displayed.    Cardiac Enzymes: No results for input(s): CKTOTAL, CKMB, CKMBINDEX, TROPONINI in the last 168 hours.  BNP: BNP (last 3 results) Recent Labs    01/04/20 1642 03/21/20 0209  BNP 165.2* 315.9*    ProBNP (last 3 results) No results for input(s): PROBNP in the last 8760 hours.    Other results:  Imaging: DG CHEST PORT 1 VIEW  Result Date: 03/25/2020  CLINICAL DATA:  Post hemodialysis catheter insertion. EXAM: PORTABLE CHEST 1 VIEW COMPARISON:  03/25/2020 FINDINGS: Left-sided pacemaker and left ventricular assist device unchanged. Right IJ Swan-Ganz catheter with tip over the main pulmonary artery segment unchanged. Interval placement of right subclavian central venous catheter with tip at the cavoatrial junction. Catheter from below with tip just right of the Swan-Ganz catheter tip in the main pulmonary artery segment unchanged. Lungs are adequately inflated demonstrate persistent hazy bilateral perihilar opacification with interval improvement. Persistent bibasilar opacification without significant change. Pneumothorax. Stable cardiomegaly. Remainder of the exam is unchanged. IMPRESSION: 1. Stable cardiomegaly with interval improvement and hazy bilateral perihilar opacification. Stable bibasilar airspace density. 2. Tubes and lines as described. New right subclavian central venous catheter with tip over the SVC. No pneumothorax. Electronically  Signed   By: Marin Olp M.D.   On: 03/25/2020 09:20   DG Chest Port 1 View  Result Date: 03/25/2020 CLINICAL DATA:  Confusion. Left ventricular assist device present. Areas of airspace consolidation EXAM: PORTABLE CHEST 1 VIEW COMPARISON:  March 24, 2020 FINDINGS: Central catheter tip is in the main pulmonary outflow tract. Pacemaker leads are attached to the right atrium and right ventricle. There is incomplete visualization of a left ventricular assist device. No pneumothorax. There is widespread airspace opacity throughout the right lung, with most pronounced consolidation in the right base. There is ill-defined airspace opacity in the left base as well, stable. There is cardiomegaly with pulmonary vascularity within normal limits. There is aortic atherosclerosis. No adenopathy. No appreciable bone lesions. IMPRESSION: Multifocal airspace opacity, considerably more on the right than on the left. Suspect multifocal pneumonia, although a degree of pulmonary edema superimposed is quite possible. Both edema and pulmonary edema may be present concurrently. Stable cardiac prominence. Postoperative changes. Aortic Atherosclerosis (ICD10-I70.0). Electronically Signed   By: Lowella Grip III M.D.   On: 03/25/2020 08:57   US Abdomen Limited RUQ  Result Date: 03/24/2020 CLINICAL DATA:  Cholecystitis. EXAM: ULTRASOUND ABDOMEN LIMITED RIGHT UPPER QUADRANT COMPARISON:  CT abdomen and pelvis 03/13/2020 FINDINGS: Examination was limited due to a small acoustic window from external bandage material as well as patient immobility. Gallbladder: Acoustic shadowing throughout the gallbladder likely secondary to the stones demonstrated on prior CT. The gallbladder wall could not be differentiated from the near surface of the stones. 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 No sonographic Murphy sign noted by sonographer. Common bile duct:  Diameter: 4 mm Liver: No focal lesion identified. Within normal limits in parenchymal echogenicity. Portal vein is patent on color Doppler imaging with normal direction of blood flow towards the liver. Other: None. IMPRESSION: Cholelithiasis with limited assessment of the gallbladder wall. No Murphy sign to strongly suggest acute cholecystitis. Electronically Signed   By: Logan Bores M.D.   On: 03/24/2020 11:55     Medications:     Scheduled Medications: . sodium chloride   Intravenous Once  . aspirin  81 mg Oral Daily  . atorvastatin  80 mg Oral q1800  . bisacodyl  10 mg Oral Daily   Or  . bisacodyl  10 mg Rectal Daily  . chlorhexidine  15 mL Mouth Rinse BID  . Chlorhexidine Gluconate Cloth  6 each Topical Daily  . docusate sodium  200 mg Oral Daily  . influenza vaccine adjuvanted  0.5 mL Intramuscular Tomorrow-1000  . insulin aspart  0-24 Units Subcutaneous Q4H  . mouth rinse  15 mL Mouth Rinse BID  .  mouth rinse  15 mL Mouth Rinse q12n4p  . pantoprazole (PROTONIX) IV  40 mg Intravenous Daily  . sodium chloride flush  10-40 mL Intracatheter Q12H  . sodium chloride flush  3 mL Intravenous Q12H  . Warfarin - Pharmacist Dosing Inpatient   Does not apply q1600    Infusions: .  prismasol BGK 4/2.5 500 mL/hr at 03/26/20 0527  .  prismasol BGK 4/2.5 200 mL/hr at 03/25/20 1002  . sodium chloride Stopped (03/22/20 2011)  . sodium chloride 10 mL/hr at 03/26/20 0700  . sodium chloride    . sodium chloride 20 mL/hr at 03/25/20 2000  . sodium chloride    . amiodarone 30 mg/hr (03/26/20 0700)  . ceFEPime (MAXIPIME) IV Stopped (03/25/20 2138)  . dexmedetomidine (PRECEDEX) IV infusion 0.1 mcg/kg/hr (03/26/20 0700)  . DOPamine Stopped (03/22/20 2318)  . lactated ringers    . lactated ringers 10 mL/hr at 03/26/20 0700  . milrinone 0.375 mcg/kg/min (03/26/20 0700)  . norepinephrine (LEVOPHED) Adult infusion Stopped (03/21/20 1049)  . prismasol BGK 4/2.5 1,500 mL/hr at 03/26/20 5409  .  vancomycin Stopped (03/25/20 1422)    PRN Medications: sodium chloride, Place/Maintain arterial line **AND** sodium chloride, acetaminophen, dextrose, heparin, heparin, hydrALAZINE, HYDROcodone-acetaminophen, lactated ringers, levalbuterol, LORazepam, ondansetron (ZOFRAN) IV, ondansetron (ZOFRAN) IV, oxyCODONE, sodium chloride, sodium chloride flush, sodium chloride flush, traMADol   Assessment/Plan:   1. Acute on chronic systolic HF -> cardiogenic shock-> S/p HM3 LVAD - Echo 2016 EF 30-35% - Echo 1/21 EF 25% - Admitted with NYHA IV symptoms and AKI with attempts at diuresis.  - Echo this admit: EF 10-15% moderate RV dysfunction - R/LHC cath 3/21 with severe 3v CAD and low output with CI 1.7.  - Swan placed. Initial co-ox 39%. Initial PAPi 1.5. Started on milrinone 0.25. - Initially planned for CABG but given need for re-do sternotomy, relatively poor targets and longstanding low EF, VAD felt to be better option  - S/p teeth extractions by Dr. Enrique Sack. - S/p HM3 VAD 4/8 - Currently on milrinone 0.375. - CI 3.15 CO-OX 68.5% .  - Speed turned up on 4/11to  5400, but looking at 4/10 echo there is significant RV dysfunction with leftward septal shift so speed decreased back to 5300 rpm.  - Continue sildenafil 20 mg tid.  - CVP 7-8 . Nephrology consulted for CVVHD.  - LDH 467  - Last dose of coumadin was 4/11. Received IV vitamin K 4/13 -INR down to 4.  - Continue ASA to 81 daily.    2. CAD with unstable angina - s/p previous PCI - cath 02/11/2020 with severe 3v CAD - now s/p VAD  3. AKI on CKD 3a - Creatinine continue to worsen -->3.2  -->3.9 ->3.2  - Nephrology consulted. Started CVVHD 4/13  - May reflect RV failure.  - Renal US 4/5 unremarkable   4. Permanent AF - now s/p MAZE + LAA Clipping 4/8 - Remains in A fib RVR 110- 120s.  - continue IV amiodarone, 30 mg/hr.  -INR has been supretherapeutic so coumadin on hold   5. 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 DM2, poorly controlled - hgbA1c 11.1% - continue insulin and SSI   7. ID - WBC down to 6.8   - 4/11 procalcitonin,  1.96  - Started empiric coverage for PNA with cefepime for now given tenuous situation.   - Urine culture obtained 03/24/20-  no growth.   8. Acute Hypoxic Respiratory Failure -On Bipap this morning.   9. Malnutrition  -Albumin 2.9   - Cortrak today.    Length of Stay: [place] NP_C 03/26/2020, 7:38 AM  Advanced Heart Failure Team Pager [phone] (M-F; 7a - 4p)  Please contact [facility] for night-coverage after hours (4p -7a ) and weekends on [email or web address]  Patient seen with NP, agree with the above note.   Patient remains on Bipap.  CVVH started yesterday, I/Os negative currently pulling 150-200 cc/hr net UF with MAP in 80s.  He is on milrinone 0.375, off epinephrine.  Co-ox 68.5% with CI 3.2.  CVP has come down with CVVH but he is still significantly above pre-op weight. LVAD parameters are stable. He is on Precedex for agitation, nurse states that he will follow commands.   General: On bipap HEENT: Normal. Neck: Supple, JVP 8 cm. Carotids OK.  Cardiac:  Mechanical heart sounds with LVAD hum present.  Lungs:  CTAB, normal effort.  Abdomen:  NT, ND, no HSM. No bruits or masses. +BS  LVAD exit site: Well-healed and incorporated. Dressing dry and intact. No erythema or drainage. Stabilization device present and accurately applied. Driveline dressing changed daily per sterile technique. Extremities:  Warm and dry. No cyanosis, clubbing, rash, or edema.  Neuro:  Follow commands.   I am concerned for significant RV failure with INR high (down to 4 today), poor UOP and significant volume overload despite Lasix gtt.  He is now on CVVH, CVP coming down nicely and MAP stable in 80s.  - Continue milrinone 0.375, good cardiac output.  Can pull Swan and follow co-ox off introducer.  - Continue UF via  CVVH, he is well above his pre-op weight.  Would keep UF in the 150 cc/hr range, avoid hypotension as we hope to get renal recovery.   Think we can stop NO, start sildenafil 20 mg tid.   He continues on cefepime for empiric coverage of ?PNA.   Cor-Trak for tube feeds.   CRITICAL CARE Performed by: Loralie Champagne  Total critical care time: 40 minutes  Critical care time was exclusive of separately billable procedures and treating other patients.  Critical care was necessary to treat or prevent imminent or life-threatening deterioration.  Critical care was time spent personally by me on the following activities: development of treatment plan with patient and/or surrogate as well as nursing, discussions with consultants, evaluation of patient's response to treatment, examination of patient, obtaining history from patient or surrogate, ordering and performing treatments and interventions, ordering and review of laboratory studies, ordering and review of radiographic studies, pulse oximetry and re-evaluation of patient's condition.  Loralie Champagne 03/26/2020 8:19 AM

## 2020-03-26 NOTE — Progress Notes (Signed)
6 Days Post-Op Procedure(s) (LRB): REDO STERNOTOMY WITH STERNAL PLATING (N/A) INSERTION OF IMPLANTABLE LEFT VENTRICULAR ASSIST DEVICE - HM3 (N/A) MAZE (N/A) TRANSESOPHAGEAL ECHOCARDIOGRAM (TEE) (N/A) Clipping Of Atrial Appendage (Left) Subjective: Not reporting complaints  Objective: Vital signs in last 24 hours: Temp:  [94.8 F (34.9 C)-97.9 F (36.6 C)] 97.3 F (36.3 C) (04/14 0815) Pulse Rate:  [30-155] 92 (04/14 0815) Cardiac Rhythm: Atrial fibrillation (04/14 0800) Resp:  [11-35] 19 (04/14 0815) BP: (75-128)/(56-114) 110/82 (04/14 0800) SpO2:  [89 %-100 %] 99 % (04/14 0815) Arterial Line BP: (58-114)/(45-80) 95/69 (04/14 0815) FiO2 (%):  [50 %] 50 % (04/14 0800) Weight:  [117.9 kg] 117.9 kg (04/14 0424)  Hemodynamic parameters for last 24 hours: PAP: (20-64)/(1-41) 30/3 CVP:  [3 mmHg-33 mmHg] 7 mmHg CO:  [7 L/min-9.6 L/min] 7.1 L/min CI:  [3.1 L/min/m2-4.3 L/min/m2] 3.2 L/min/m2  Intake/Output from previous day: 04/13 0701 - 04/14 0700 In: 2253.9 [I.V.:1842.2; IV Piggyback:411.7] Out: 5668 [Urine:315; Chest Tube:310] Intake/Output this shift: Total I/O In: 49.7 [I.V.:49.7] Out: 306 [Other:266; Chest Tube:40]  General appearance: no distress Neurologic: nonfocal Heart: irregularly irregular rhythm Lungs: clear to auscultation bilaterally Abdomen: soft, non-tender; bowel sounds normal; no masses,  no organomegaly Extremities: extremities normal, atraumatic, no cyanosis or edema Wound: c/d/i  Lab Results: Recent Labs    03/25/20 0303 03/25/20 0512 03/26/20 0356 03/26/20 0358  WBC 11.1*  --  6.8  --   HGB 9.1*   < > 9.0* 8.5*  HCT 27.8*   < > 26.9* 25.0*  PLT 143*  --  125*  --    < > = values in this interval not displayed.   BMET:  Recent Labs    03/25/20 1532 03/25/20 1536 03/26/20 0356 03/26/20 0358  NA 134*   < > 134* 133*  K 4.0   < > 4.0 4.0  CL 97*  --  99  --   CO2 18*  --  20*  --   GLUCOSE 150*  --  175*  --   BUN 70*  --  57*  --    CREATININE 3.71*  --  3.18*  --   CALCIUM 8.5*  --  8.4*  --    < > = values in this interval not displayed.    PT/INR:  Recent Labs    03/26/20 0356  LABPROT 41.8*  INR 4.4*   ABG    Component Value Date/Time   PHART 7.466 (H) 03/26/2020 0358   HCO3 21.6 03/26/2020 0358   TCO2 23 03/26/2020 0358   ACIDBASEDEF 2.0 03/26/2020 0358   O2SAT 68.5 03/26/2020 0400   CBG (last 3)  Recent Labs    03/25/20 2318 03/26/20 0330 03/26/20 0741  GLUCAP 165* 164* 180*    Assessment/Plan: S/P Procedure(s) (LRB): REDO STERNOTOMY WITH STERNAL PLATING (N/A) INSERTION OF IMPLANTABLE LEFT VENTRICULAR ASSIST DEVICE - HM3 (N/A) MAZE (N/A) TRANSESOPHAGEAL ECHOCARDIOGRAM (TEE) (N/A) Clipping Of Atrial Appendage (Left) Mobilize d/c PA cath  Remove mediastinal tubes Consider echo Feeding tube today   LOS: 14 days    Wonda Olds 03/26/2020

## 2020-03-26 NOTE — Procedures (Signed)
Cortrak  Tube Type:  Cortrak - 43 inches Tube Location:  Left nare Initial Placement:  Stomach Secured by: Bridle Technique Used to Measure Tube Placement:  Documented cm marking at nare/ corner of mouth Cortrak Secured At:  65 cm    Cortrak Tube Team Note:  Consult received to place a Cortrak feeding tube.    X-ray is required, abdominal x-ray has been ordered by the Cortrak team. Please confirm tube placement before using the Cortrak tube.   If the tube becomes dislodged please keep the tube and contact the Cortrak team at [email or web address] ([email or web address]) for replacement.  If after hours and replacement cannot be delayed, place a NG tube and confirm placement with an abdominal x-ray.    Koleen Distance MS, RD, LDN Please refer to [facility] for RD and/or RD on-call/weekend/after hours pager

## 2020-03-26 NOTE — Progress Notes (Signed)
Physical Therapy Treatment Patient Details Name: [name] [name] MRN: [id number] DOB: February 04, 1951 Today's Date: 03/26/2020    History of Present Illness 69 yo male with history of CAD with prior stenting of the LAD, Diagonal and OM in an outside hospital, ischemic cardiomyopathy with ICD in place, chronic systolic CHF, MVP s/p mitral valve repair, permanent atrial fib on chronic anticoagulation, HTN, HLD, DM, depression admitted to [facility] with progressive weakness, fatigue, dyspnea and chest pressure. Troponin elevated at 0.06. He was transferred to [facility] for cardiac cath, showing severe 3 vessel disease. Plan for CABG but pt with AKI. Pt underwent multiple tooth extraction on 4/6 and LVAD placement on 4/8.    PT Comments    Pt with multiple medical issues post LVAD. Worked on bed level lower extremity exercises and tried to get pt to assist with these. He did not follow commands. He intermittently assisted with the lower extremity movements but not to command. Noted spontaneous movement of all extremities.    Follow Up Recommendations  CIR;Supervision/Assistance - 24 hour(assuming pt progresses)     Equipment Recommendations  Other (comment)(To be determined)    Recommendations for Other Services       Precautions / Restrictions Precautions Precautions: Fall Precaution Comments: LVAD, sternal    Mobility  Bed Mobility                  Transfers                    Ambulation/Gait                 Stairs             Wheelchair Mobility    Modified Rankin (Stroke Patients Only)       Balance                                            Cognition Arousal/Alertness: Lethargic   Overall Cognitive Status: Difficult to assess                                 General Comments: Pt responded to voice but did not follow commands.       Exercises General Exercises - Lower Extremity Ankle  Circles/Pumps: PROM;Both;5 reps;Supine Heel Slides: PROM;AAROM;Both;5 reps;Supine Hip ABduction/ADduction: PROM;AAROM;5 reps;Supine Straight Leg Raises: PROM;AAROM;5 reps;Supine    General Comments        Pertinent Vitals/Pain Pain Assessment: Faces Faces Pain Scale: No hurt    Home Living                      Prior Function            PT Goals (current goals can now be found in the care plan section) Progress towards PT goals: Not progressing toward goals - comment(lethargic and on bipap)    Frequency    Min 3X/week      PT Plan Current plan remains appropriate    Co-evaluation              AM-PAC PT "6 Clicks" Mobility   Outcome Measure  Help needed turning from your back to your side while in a flat bed without using bedrails?: Total Help needed moving from lying on your back to sitting on the side of  a flat bed without using bedrails?: Total Help needed moving to and from a bed to a chair (including a wheelchair)?: Total Help needed standing up from a chair using your arms (e.g., wheelchair or bedside chair)?: Total Help needed to walk in hospital room?: Total Help needed climbing 3-5 steps with a railing? : Total 6 Click Score: 6    End of Session Equipment Utilized During Treatment: Oxygen(bipap) Activity Tolerance: Patient limited by lethargy Patient left: in bed;with call bell/phone within reach;with bed alarm set Nurse Communication: Other (comment)(treatment session) PT Visit Diagnosis: Unsteadiness on feet (R26.81);Muscle weakness (generalized) (M62.81);Difficulty in walking, not elsewhere classified (R26.2)     Time: 2767-0110 PT Time Calculation (min) (ACUTE ONLY): 10 min  Charges:  $Therapeutic Activity: 8-22 mins                     [place] Pager [phone] Office [place] 03/26/2020, 3:03 PM

## 2020-03-26 NOTE — Progress Notes (Signed)
Palliative:  HPI: 69 yo male with PMH ICM, systolic CHF EF 86%, LAD stenting, s/p AICD, MVP s/p mitral valve repair 2004, atrial fibrillation on anticoagulation, HTN, HLD, diabetes, depression admitted to [facility] with worsening weakness, fatigue, dyspnea, chest pressure and subsequent transfer to [facility] for cardiac cath which showed severe three vessel CAD and ECHO shows EF 10-155 with moderate RV dysfunction. LVAD placed 4/8. 4/10 ECHO shows significant RV dysfunction. Since 4/12 he has had continued decline in mental status and 4/13 began CRRT, required BiPAP support, and INR reversed with Vit K. 4/14 INR improved and tolerating CRRT.   I met again today at [name] [name]'s bedside with daughter, [name] [name]. [name] [name] continues to have fluctuating mental status and overall restless and confused. He is on BiPAP but did open his eyes when I requested. He is tolerating CRRT and fluid removal thankfully.   Discussed with [name] concern for declined RV function and that it will take some time to see how he will respond to CRRT and how he does overall with his RV function and renal function. [name] seems to understand. She is very supportive of [name] [name] and plans to meet with LVAD coordinator to begin training tomorrow afternoon.   All questions/concerns addressed. Emotional support provided.   Exam: Lethargic but a little restless although improved from yesterday. Follows some commands (opened eyes to command). On BiPAP with no respiratory distress. CRRT running. On amio, milrinone, precedex infusions.   Plan: - Continue with full aggressive care.  - Hopeful for improvement.   25 min  Vinie Sill, NP Palliative Medicine Team Pager [phone] (Please see [email or web address] for schedule) Team Phone [phone]    Greater than 50%  of this time was spent counseling and coordinating care related to the above assessment and plan

## 2020-03-27 ENCOUNTER — Inpatient Hospital Stay (HOSPITAL_COMMUNITY): Payer: Medicare HMO

## 2020-03-27 ENCOUNTER — Encounter (HOSPITAL_COMMUNITY): Payer: Self-pay | Admitting: Cardiothoracic Surgery

## 2020-03-27 DIAGNOSIS — R570 Cardiogenic shock: Secondary | ICD-10-CM | POA: Diagnosis not present

## 2020-03-27 DIAGNOSIS — I639 Cerebral infarction, unspecified: Secondary | ICD-10-CM | POA: Diagnosis not present

## 2020-03-27 DIAGNOSIS — I4811 Longstanding persistent atrial fibrillation: Secondary | ICD-10-CM | POA: Diagnosis not present

## 2020-03-27 DIAGNOSIS — D689 Coagulation defect, unspecified: Secondary | ICD-10-CM | POA: Diagnosis not present

## 2020-03-27 LAB — CBC WITH DIFFERENTIAL/PLATELET
Abs Immature Granulocytes: 0.49 10*3/uL — ABNORMAL HIGH (ref 0.00–0.07)
Basophils Absolute: 0 10*3/uL (ref 0.0–0.1)
Basophils Relative: 0 %
Eosinophils Absolute: 0 10*3/uL (ref 0.0–0.5)
Eosinophils Relative: 0 %
HCT: 27.2 % — ABNORMAL LOW (ref 39.0–52.0)
Hemoglobin: 8.9 g/dL — ABNORMAL LOW (ref 13.0–17.0)
Immature Granulocytes: 5 %
Lymphocytes Relative: 3 %
Lymphs Abs: 0.3 10*3/uL — ABNORMAL LOW (ref 0.7–4.0)
MCH: 29.7 pg (ref 26.0–34.0)
MCHC: 32.7 g/dL (ref 30.0–36.0)
MCV: 90.7 fL (ref 80.0–100.0)
Monocytes Absolute: 0.7 10*3/uL (ref 0.1–1.0)
Monocytes Relative: 7 %
Neutro Abs: 8.9 10*3/uL — ABNORMAL HIGH (ref 1.7–7.7)
Neutrophils Relative %: 85 %
Platelets: 139 10*3/uL — ABNORMAL LOW (ref 150–400)
RBC: 3 MIL/uL — ABNORMAL LOW (ref 4.22–5.81)
RDW: 14.7 % (ref 11.5–15.5)
WBC: 10.4 10*3/uL (ref 4.0–10.5)
nRBC: 0.7 % — ABNORMAL HIGH (ref 0.0–0.2)

## 2020-03-27 LAB — HEPATIC FUNCTION PANEL
ALT: 25 U/L (ref 0–44)
AST: 38 U/L (ref 15–41)
Albumin: 2.6 g/dL — ABNORMAL LOW (ref 3.5–5.0)
Alkaline Phosphatase: 94 U/L (ref 38–126)
Bilirubin, Direct: 0.4 mg/dL — ABNORMAL HIGH (ref 0.0–0.2)
Indirect Bilirubin: 0.9 mg/dL (ref 0.3–0.9)
Total Bilirubin: 1.3 mg/dL — ABNORMAL HIGH (ref 0.3–1.2)
Total Protein: 5.7 g/dL — ABNORMAL LOW (ref 6.5–8.1)

## 2020-03-27 LAB — RENAL FUNCTION PANEL
Albumin: 2.5 g/dL — ABNORMAL LOW (ref 3.5–5.0)
Albumin: 2.7 g/dL — ABNORMAL LOW (ref 3.5–5.0)
Anion gap: 11 (ref 5–15)
Anion gap: 14 (ref 5–15)
BUN: 42 mg/dL — ABNORMAL HIGH (ref 8–23)
BUN: 41 mg/dL — ABNORMAL HIGH (ref 8–23)
CO2: 24 mmol/L (ref 22–32)
CO2: 22 mmol/L (ref 22–32)
Calcium: 8.3 mg/dL — ABNORMAL LOW (ref 8.9–10.3)
Calcium: 8.4 mg/dL — ABNORMAL LOW (ref 8.9–10.3)
Chloride: 103 mmol/L (ref 98–111)
Chloride: 102 mmol/L (ref 98–111)
Creatinine, Ser: 2.44 mg/dL — ABNORMAL HIGH (ref 0.61–1.24)
Creatinine, Ser: 2.47 mg/dL — ABNORMAL HIGH (ref 0.61–1.24)
GFR calc Af Amer: 30 mL/min — ABNORMAL LOW (ref >60)
GFR calc Af Amer: 29 mL/min — ABNORMAL LOW (ref >60)
GFR calc non Af Amer: 26 mL/min — ABNORMAL LOW (ref >60)
GFR calc non Af Amer: 25 mL/min — ABNORMAL LOW (ref >60)
Glucose, Bld: 129 mg/dL — ABNORMAL HIGH (ref 70–99)
Glucose, Bld: 273 mg/dL — ABNORMAL HIGH (ref 70–99)
Phosphorus: 2.6 mg/dL (ref 2.5–4.6)
Phosphorus: 2.5 mg/dL (ref 2.5–4.6)
Potassium: 3.5 mmol/L (ref 3.5–5.1)
Potassium: 4.3 mmol/L (ref 3.5–5.1)
Sodium: 138 mmol/L (ref 135–145)
Sodium: 138 mmol/L (ref 135–145)

## 2020-03-27 LAB — PROTIME-INR
INR: 6.2 (ref 0.8–1.2)
Prothrombin Time: 55.4 seconds — ABNORMAL HIGH (ref 11.4–15.2)

## 2020-03-27 LAB — POCT I-STAT 7, (LYTES, BLD GAS, ICA,H+H)
Acid-Base Excess: 1 mmol/L (ref 0.0–2.0)
Bicarbonate: 24.9 mmol/L (ref 20.0–28.0)
Calcium, Ion: 1.18 mmol/L (ref 1.15–1.40)
HCT: 27 % — ABNORMAL LOW (ref 39.0–52.0)
Hemoglobin: 9.2 g/dL — ABNORMAL LOW (ref 13.0–17.0)
O2 Saturation: 98 %
Patient temperature: 35.9
Potassium: 3.5 mmol/L (ref 3.5–5.1)
Sodium: 138 mmol/L (ref 135–145)
TCO2: 26 mmol/L (ref 22–32)
pCO2 arterial: 36.2 mmHg (ref 32.0–48.0)
pH, Arterial: 7.44 (ref 7.350–7.450)
pO2, Arterial: 103 mmHg (ref 83.0–108.0)

## 2020-03-27 LAB — COOXEMETRY PANEL
Carboxyhemoglobin: 0.9 % (ref 0.5–1.5)
Methemoglobin: 0.4 % (ref 0.0–1.5)
O2 Saturation: 64.7 %
Total hemoglobin: 8.8 g/dL — ABNORMAL LOW (ref 12.0–16.0)

## 2020-03-27 LAB — GLUCOSE, CAPILLARY
Glucose-Capillary: 125 mg/dL — ABNORMAL HIGH (ref 70–99)
Glucose-Capillary: 166 mg/dL — ABNORMAL HIGH (ref 70–99)
Glucose-Capillary: 173 mg/dL — ABNORMAL HIGH (ref 70–99)
Glucose-Capillary: 186 mg/dL — ABNORMAL HIGH (ref 70–99)
Glucose-Capillary: 221 mg/dL — ABNORMAL HIGH (ref 70–99)
Glucose-Capillary: 242 mg/dL — ABNORMAL HIGH (ref 70–99)

## 2020-03-27 LAB — AMMONIA: Ammonia: 35 umol/L (ref 9–35)

## 2020-03-27 LAB — BRAIN NATRIURETIC PEPTIDE: B Natriuretic Peptide: 425.7 pg/mL — ABNORMAL HIGH (ref 0.0–100.0)

## 2020-03-27 LAB — MAGNESIUM: Magnesium: 2.6 mg/dL — ABNORMAL HIGH (ref 1.7–2.4)

## 2020-03-27 LAB — LACTATE DEHYDROGENASE: LDH: 453 U/L — ABNORMAL HIGH (ref 98–192)

## 2020-03-27 MED ORDER — MELATONIN 3 MG PO TABS
3.0000 mg | ORAL_TABLET | Freq: Every day | ORAL | Status: DC
  Administered 2020-03-27: 3 mg via ORAL
  Filled 2020-03-27: qty 1

## 2020-03-27 MED ORDER — ORAL CARE MOUTH RINSE
15.0000 mL | Freq: Two times a day (BID) | OROMUCOSAL | Status: DC
  Administered 2020-03-27 – 2020-03-30 (×7): 15 mL via OROMUCOSAL

## 2020-03-27 MED ORDER — INSULIN ASPART 100 UNIT/ML ~~LOC~~ SOLN
3.0000 [IU] | SUBCUTANEOUS | Status: DC
  Administered 2020-03-27 – 2020-04-03 (×38): 3 [IU] via SUBCUTANEOUS

## 2020-03-27 MED ORDER — CHLORHEXIDINE GLUCONATE 0.12 % MT SOLN
15.0000 mL | Freq: Two times a day (BID) | OROMUCOSAL | Status: DC
  Administered 2020-03-27 – 2020-04-01 (×10): 15 mL via OROMUCOSAL
  Filled 2020-03-27 (×4): qty 15

## 2020-03-27 MED ORDER — VITAMIN K1 10 MG/ML IJ SOLN
1.0000 mg | Freq: Once | INTRAVENOUS | Status: AC
  Administered 2020-03-27: 12:00:00 1 mg via INTRAVENOUS
  Filled 2020-03-27 (×2): qty 0.1

## 2020-03-27 NOTE — Progress Notes (Signed)
Patient transported to CT on BIPAP and back to [place]. No complications noted.

## 2020-03-27 NOTE — Progress Notes (Addendum)
Patient ID: [name] [name], male   DOB: 12/12/1951, 69 y.o.   MRN: [id number]    Advanced Heart Failure Rounding Note   Subjective:    - S/p HM3 VAD 4/8 w MAZE procedure + LAA clipping. - Extubated 4/9 - 4/13 CVVHD   LVAD speed increased to 5400 earlier --> echo moderate-severe RV dysfunction, septum mildly left shifted, trivial pericardial effusion.  LVAD speed cut back to 5300.   He is currently on milrinone 0.375, amiodarone 30 mg/hr.    INR 6.2 (has not had warfarin for several days now).   Remains on bipap. He is sedated but will follow commands and move all extremities.   LDH 467 => 453  VAD Interrogation  Flow 4.6 , Speed 5300, Power 4, Pulse Index 3.5  Rare PI events.   Objective:   Weight Range:  Vital Signs:   Temp:  [96 F (35.6 C)-97.5 F (36.4 C)] 96 F (35.6 C) (04/15 0800) Pulse Rate:  [74-174] 92 (04/15 0800) Resp:  [9-31] 14 (04/15 0800) BP: (74-135)/(56-113) 124/110 (04/15 0800) SpO2:  [82 %-100 %] 91 % (04/15 0800) Arterial Line BP: (56-140)/(43-85) 111/65 (04/15 0800) FiO2 (%):  [50 %] 50 % (04/15 0741) Weight:  [112.4 kg] 112.4 kg (04/15 0500) Last BM Date: 04/11/2020  Weight change: Filed Weights   03/25/20 0317 03/26/20 0424 03/27/20 0500  Weight: 118.3 kg 117.9 kg 112.4 kg    Intake/Output:   Intake/Output Summary (Last 24 hours) at 03/27/2020 0918 Last data filed at 03/27/2020 0700 Gross per 24 hour  Intake 2427.69 ml  Output 4998 ml  Net -2570.31 ml       Physical Exam: CVP 10-11  GENERAL:  On bipap  HEENT: normal  NECK: Supple, JVP 10-11  .  2+ bilaterally, no bruits.  No lymphadenopathy or thyromegaly appreciated.   CARDIAC:  Mechanical heart sounds with LVAD hum present. R HD catheter  LUNGS:  Clear to auscultation bilaterally on Bipap ABDOMEN:  Soft, round, nontender, positive bowel sounds x4.     LVAD exit site:   Dressing dry and intact.  No erythema or drainage.  Stabilization device present and accurately applied.   Driveline dressing is being changed daily per sterile technique. EXTREMITIES:  Warm and dry, no cyanosis, clubbing, rash or edema  NEUROLOGIC:  Opens eyes. MAE. Intermittently follows commands.    Telemetry: A fib 90-100s   Labs: Basic Metabolic Panel: Recent Labs  Lab 03/23/20 0335 03/23/20 1523 03/24/20 0343 03/24/20 0404 03/25/20 0303 03/25/20 0512 03/25/20 1532 03/25/20 1536 03/26/20 0356 03/26/20 0356 03/26/20 0358 03/26/20 1304 03/26/20 1536 03/27/20 0343 03/27/20 0345  NA 134*   < > 134*   < > 130*   < > 134*   < > 134*   < > 133* 135 135 138 138  K 3.5   < > 3.4*   < > 3.5   < > 4.0   < > 4.0   < > 4.0 3.9 4.0 3.5 3.5  CL 98   < > 97*   < > 94*  --  97*  --  99  --   --   --  100 103  --   CO2 21*   < > 20*   < > 18*  --  18*  --  20*  --   --   --  21* 24  --   GLUCOSE 116*   < > 108*   < > 178*  --  150*  --  175*  --   --   --  184* 129*  --   BUN 40*   < > 54*   < > 71*  --  70*  --  57*  --   --   --  49* 42*  --   CREATININE 2.88*   < > 3.57*   < > 3.98*  --  3.71*  --  3.18*  --   --   --  2.75* 2.44*  --   CALCIUM 8.9   < > 8.6*   < > 8.4*   < > 8.5*   < > 8.4*  --   --   --  8.5* 8.3*  --   MG 2.1  --  2.2  --  2.3  --   --   --  2.5*  --   --   --   --  2.6*  --   PHOS 5.7*  --  5.6*   < > 5.4*  --  5.1*  --  4.3  --   --   --  3.5 2.6  --    < > = values in this interval not displayed.    Liver Function Tests: Recent Labs  Lab 03/22/20 1454 03/22/20 1454 03/23/20 0335 03/23/20 0335 03/23/20 1523 03/23/20 1523 03/24/20 0343 03/25/20 1532 03/26/20 0356 03/26/20 1536 03/27/20 0343  AST 90*  --  70*  --  64*  --  62*  --   --   --  38  ALT 27  --  24  --  24  --  26  --   --   --  25  ALKPHOS 67  --  78  --  74  --  72  --   --   --  94  BILITOT 2.4*  --  2.0*  --  2.2*  --  1.5*  --   --   --  1.3*  PROT 6.0*  --  5.9*  --  5.9*  --  6.1*  --   --   --  5.7*  ALBUMIN 3.2*   < > 3.0*   < > 2.9*   < > 3.0* 2.7* 2.4* 2.5* 2.6*  2.5*   < > =  values in this interval not displayed.   No results for input(s): LIPASE, AMYLASE in the last 168 hours. Recent Labs  Lab 03/24/20 0719  AMMONIA 32    CBC: Recent Labs  Lab 03/23/20 0335 03/23/20 0335 03/24/20 0343 03/24/20 0404 03/25/20 0303 03/25/20 0512 03/26/20 0356 03/26/20 0358 03/26/20 1304 03/27/20 0343 03/27/20 0345  WBC 17.8*  --  16.0*  --  11.1*  --  6.8  --   --  10.4  --   NEUTROABS 16.0*  --  14.1*  --  9.2*  --  5.5  --   --  8.9*  --   HGB 10.5*   < > 9.9*   < > 9.1*   < > 9.0* 8.5* 9.9* 8.9* 9.2*  HCT 31.7*   < > 30.0*   < > 27.8*   < > 26.9* 25.0* 29.0* 27.2* 27.0*  MCV 92.2  --  91.7  --  90.8  --  90.9  --   --  90.7  --   PLT 139*  --  158  --  143*  --  125*  --   --  139*  --    < > = values in  this interval not displayed.    Cardiac Enzymes: No results for input(s): CKTOTAL, CKMB, CKMBINDEX, TROPONINI in the last 168 hours.  BNP: BNP (last 3 results) Recent Labs    01/04/20 1642 03/21/20 0209 03/26/20 2329  BNP 165.2* 315.9* 425.7*    ProBNP (last 3 results) No results for input(s): PROBNP in the last 8760 hours.    Other results:  Imaging: CT HEAD WO CONTRAST  Result Date: 03/27/2020 CLINICAL DATA:  Post LVAD.  Ataxia, agitated EXAM: CT HEAD WITHOUT CONTRAST TECHNIQUE: Contiguous axial images were obtained from the base of the skull through the vertex without intravenous contrast. COMPARISON:  01/04/2020 FINDINGS: Brain: Large area of infarct in the posterior temporal and parietal lobes on the right, new since prior study. No hemorrhage. No hydrocephalus. Vascular: No hyperdense vessel or unexpected calcification. Skull: No acute calvarial abnormality. Sinuses/Orbits: Visualized paranasal sinuses and mastoids clear. Orbital soft tissues unremarkable. Other: None IMPRESSION: Large posterior right MCA infarct, new since prior study. Electronically Signed   By: Rolm Baptise M.D.   On: 03/27/2020 09:08   DG Chest Port 1 View  Result  Date: 03/27/2020 CLINICAL DATA:  Chest tube.  Left ventricular assist device. EXAM: PORTABLE CHEST 1 VIEW COMPARISON:  03/26/2020 FINDINGS: Interim placement of feeding tube, its tip is below left hemidiaphragm. Interim removal Swan-Ganz catheter and mediastinal drainage catheters. Right IJ sheath in stable position. Right subclavian line stable position. Left chest tube in stable position. Cardiac pacer left ventricular assist device in stable position. Left atrial appendage clip again noted. Stable cardiomegaly. Unchanged bilateral interstitial prominence suggesting interstitial edema. Pneumonitis cannot be excluded. Tiny left pleural effusion cannot be excluded. No pneumothorax. IMPRESSION: 1. Interim placement of feeding tube, its tip is below left hemidiaphragm. Interim removal Swan-Ganz catheter and mediastinal drainage catheters. Right IJ sheath in stable position. Right subclavian line in stable position. Left chest tube in stable position. No pneumothorax. 2. Cardiac pacer left ventricular assist device in stable position. Stable cardiomegaly. 3. Unchanged bilateral interstitial prominence suggesting interstitial edema. Tiny left pleural effusion cannot be excluded. Electronically Signed   By: Marcello Moores  Register   On: 03/27/2020 07:26   DG Chest Port 1 View  Result Date: 03/26/2020 CLINICAL DATA:  Status post cardiac surgery. EXAM: PORTABLE CHEST 1 VIEW COMPARISON:  March 25, 2020. FINDINGS: Stable cardiomegaly. Left-sided pacemaker is unchanged in position. Stable position of left ventricular assist device. Right subclavian catheter is unchanged in position. Right internal jugular Swan-Ganz catheter is noted with tip in expected position of main pulmonary artery. Left-sided chest tube is noted without pneumothorax. Mild bilateral lung opacities are noted concerning for edema. Probable small left pleural effusion and left basilar atelectasis is noted. Bony thorax is unremarkable. IMPRESSION: Stable  support apparatus. No pneumothorax is noted. Mild bilateral lung opacities are noted concerning for edema. Probable small left pleural effusion and left basilar atelectasis is noted. Electronically Signed   By: Marijo Conception M.D.   On: 03/26/2020 09:47   DG Abd Portable 1V  Result Date: 03/26/2020 CLINICAL DATA:  Feeding tube. EXAM: PORTABLE ABDOMEN - 1 VIEW COMPARISON:  None. FINDINGS: Distal tip of feeding tube appears to be in proximal stomach. Visualized bowel loops are unremarkable. IMPRESSION: Distal tip of feeding tube appears to be in proximal stomach. Electronically Signed   By: Marijo Conception M.D.   On: 03/26/2020 11:49     Medications:     Scheduled Medications: . sodium chloride   Intravenous Once  . aspirin  81 mg Per Tube Daily  . atorvastatin  80 mg Per Tube q1800  . B-complex with vitamin C  1 tablet Per Tube Daily  . bisacodyl  10 mg Oral Daily   Or  . bisacodyl  10 mg Rectal Daily  . chlorhexidine  15 mL Mouth Rinse BID  . Chlorhexidine Gluconate Cloth  6 each Topical Daily  . docusate sodium  200 mg Oral Daily  . feeding supplement (PRO-STAT SUGAR FREE 64)  30 mL Per Tube Daily  . influenza vaccine adjuvanted  0.5 mL Intramuscular Tomorrow-1000  . insulin aspart  0-24 Units Subcutaneous Q4H  . mouth rinse  15 mL Mouth Rinse BID  . mouth rinse  15 mL Mouth Rinse q12n4p  . pantoprazole (PROTONIX) IV  40 mg Intravenous Daily  . sildenafil  20 mg Per Tube TID  . sodium chloride flush  10-40 mL Intracatheter Q12H  . sodium chloride flush  3 mL Intravenous Q12H  . Warfarin - Pharmacist Dosing Inpatient   Does not apply q1600    Infusions: .  prismasol BGK 4/2.5 500 mL/hr at 03/27/20 0126  .  prismasol BGK 4/2.5 200 mL/hr at 03/26/20 1130  . sodium chloride Stopped (03/22/20 2011)  . sodium chloride Stopped (03/26/20 2158)  . sodium chloride    . sodium chloride 20 mL/hr at 03/27/20 0500  . sodium chloride    . amiodarone 30 mg/hr (03/27/20 0700)  . ceFEPime  (MAXIPIME) IV Stopped (03/26/20 2153)  . dexmedetomidine (PRECEDEX) IV infusion Stopped (03/27/20 0938)  . feeding supplement (VITAL 1.5 CAL) 40 mL/hr at 03/27/20 0700  . lactated ringers    . lactated ringers 10 mL/hr at 03/27/20 0700  . milrinone 0.375 mcg/kg/min (03/27/20 0700)  . norepinephrine (LEVOPHED) Adult infusion Stopped (03/21/20 1049)  . prismasol BGK 4/2.5 1,500 mL/hr at 03/27/20 0554  . vancomycin Stopped (03/26/20 1311)    PRN Medications: sodium chloride, Place/Maintain arterial line **AND** sodium chloride, acetaminophen, dextrose, fentaNYL (SUBLIMAZE) injection, heparin, heparin, hydrALAZINE, HYDROcodone-acetaminophen, lactated ringers, levalbuterol, LORazepam, ondansetron (ZOFRAN) IV, ondansetron (ZOFRAN) IV, oxyCODONE, sodium chloride, sodium chloride flush, sodium chloride flush, sorbitol, traMADol   Assessment/Plan:   1. Acute on chronic systolic HF -> cardiogenic shock-> S/p HM3 LVAD - Echo 2016 EF 30-35% - Echo 1/21 EF 25% - Admitted with NYHA IV symptoms and AKI with attempts at diuresis.  - Echo this admit: EF 10-15% moderate RV dysfunction - R/LHC cath 3/21 with severe 3v CAD and low output with CI 1.7.  - Swan placed. Initial co-ox 39%. Initial PAPi 1.5. Started on milrinone 0.25. - Initially planned for CABG but given need for re-do sternotomy, relatively poor targets and longstanding low EF, VAD felt to be better option  - S/p teeth extractions by Dr. Enrique Sack. - S/p HM3 VAD 4/8 - Currently on milrinone 0.375. - Speed turned up on 4/11to  5400, but looking at 4/10 echo there is significant RV dysfunction with leftward septal shift so speed decreased back to 5300 rpm.  - Continue sildenafil 20 mg tid for now.   -  Nephrology consulted for CVVHD.  - LDH 453   - Last dose of coumadin was 4/11. Received IV vitamin K 4/13 -INR 6.2. Discussed with neurology. Give 1 mg IV vitamin k.  - Continue ASA to 81 daily.    2. CAD with unstable angina - s/p  previous PCI - cath 03/11/2020 with severe 3v CAD - now s/p VAD on 03/22/2020    3. AKI on CKD 3a -  Creatinine continue to worsen -->3.2  -->3.9 ->3.2 ->2.44 - Nephrology consulted. Started CVVHD 4/13  - May reflect RV failure.  - Renal US 4/5 unremarkable   4. Permanent AF - now s/p MAZE + LAA Clipping 4/8 - Remains in A fib RVR  90-100s   - continue IV amiodarone, 30 mg/hr.  -INR has been supretherapeutic so coumadin on hold   5. 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 DM2, poorly controlled - hgbA1c 11.1% - continue insulin and SSI   7. ID - WBC down to 6.8   - 4/11 procalcitonin,  1.96  - Started empiric coverage for PNA with vancomycin/cefepime for now given tenuous situation.   - Urine culture obtained 03/24/20- no growth.   8. Acute Hypoxic Respiratory Failure -On Bipap this morning.   9. Malnutrition  -Albumin 2.9   - On tube feeds.    10. 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 Dr Aundra Dubin discussed with Dr Leonel Ramsay.   Consulted hematology, neurology, PCCM . Extremely tenuous.     Length of Stay: [place] NP-C 03/27/2020, 9:18 AM  Advanced Heart Failure Team Pager [phone] (M-F; 7a - 4p)  Please contact [facility] for night-coverage after hours (4p -7a ) and weekends on [email or web address]  Patient seen with NP, agree with the above note.   Patient remains on Bipap.  He continues on CVVH pulling net UF 150 cc/hr currently, weight coming down.  CVP still 17 with pulmonary edema on CXR.  He is on milrinone 0.375 with co-ox 65%.   INR remains 6.2 today with stable LDH.  He has been confused but will follow commands and move all limbs.  Given persistent delirium and sedation, we ordered a head CT today that showed a large posterior temporal and parietal lobe infarct on the right, no hemorrhage.  This is new.    AST and ALT normal, mild tbili elevation at 1.3.    General: Bipap on  HEENT: Normal. Neck: Supple, JVP 14+ cm. Carotids OK.  Cardiac:  Mechanical heart sounds with LVAD hum present.  Lungs:  Decreased at bases.  Abdomen:  NT, ND, no HSM. No bruits or masses. +BS  LVAD exit site: Well-healed and incorporated. Dressing dry and intact. No erythema or drainage. Stabilization device present and accurately applied. Driveline dressing changed daily per sterile technique. Extremities:  Warm and dry. No cyanosis, clubbing, rash, or edema.  Neuro:  Confused but will follow commands and move all extremities.   New CVA identified on head CT today.  By symptoms, hard to tell when this started as he has had a degree of delirium for several days.  He follows commands and moves all extremities.  Difficult situation with INR 6.2 and ischemic CVA.   I think pump partial thrombus is less likely given LDH stability, probably more likely to be related to his chronic atrial fibrillation.  - Neuro consulted.  - For now, we will try to gently lower INR to therapeutic range, will give 1 mg IV vitamin K (concern about significant risk for hemorrhagic conversion).  - We have been concerned about whether he is truly anticoagulated.  INR has been persistently high off warfarin and with getting vitamin K earlier this week.  I was concerned for liver dysfunction perhaps from RV failure, but AST/ALT are normal and tbili is only mildly elevated at 1.3.  We  have asked for hematology evaluation to help with this question.   I have been concerned for significant RV failure with INR high (6.2 today), poor UOP and significant volume overload despite Lasix gtt.  He is now on CVVH, MAP stable but CVP still high in the 17s range.   - Continue CVVH pulling currently 150 cc/hr net.  - Continue milrinone 0.375, good cardiac output by co-ox.    - Continue sildenafil 20 tid.   He continues on vanco/cefepime for empiric coverage of ?PNA.  Cor-Trak for tube feeds.   Will ask CCM  to see as remains on Bipap and with CVA, concerned he could need intubation depending on mental status as we go forwards.   CRITICAL CARE Performed by: Loralie Champagne  Total critical care time:27minutes  Critical care time was exclusive of separately billable procedures and treating other patients.  Critical care was necessary to treat or prevent imminent or life-threatening deterioration.  Critical care was time spent personally by me on the following activities: development of treatment plan with patient and/or surrogate as well as nursing, discussions with consultants, evaluation of patient's response to treatment, examination of patient, obtaining history from patient or surrogate, ordering and performing treatments and interventions, ordering and review of laboratory studies, ordering and review of radiographic studies, pulse oximetry and re-evaluation of patient's condition.  Loralie Champagne 03/27/2020 9:59 AM

## 2020-03-27 NOTE — Progress Notes (Signed)
Occupational Therapy Treatment Patient Details Name: [name] [name] MRN: [id number] DOB: 07/20/1951 Today's Date: 03/27/2020    History of present illness 69 yo male with history of CAD with prior stenting of the LAD, Diagonal and OM in an outside hospital, ischemic cardiomyopathy with ICD in place, chronic systolic CHF, MVP s/p mitral valve repair, permanent atrial fib on chronic anticoagulation, HTN, HLD, DM, depression admitted to [facility] with progressive weakness, fatigue, dyspnea and chest pressure. Troponin elevated at 0.06. He was transferred to [facility] for cardiac cath, showing severe 3 vessel disease. Plan for CABG but pt with AKI. Pt underwent multiple tooth extraction on 4/6 and LVAD placement on 4/8. Postop day 7 patient was noted to be agitated the CT head was obtained showing a large posterior right MCA infarct    OT comments  Pt seen at bed level with VSS. Pt lethargic and inconsistently following commands. Better command follow with RUE; note to have R gaze preference when eyes open; unable to maintain gaze toward L field however also very lethargic. Spontaneously moving LUE but not moving to command. At one point pt appeared to say "it has a mind of it's own".  Recommend chair position @ bed level. Will reassess goals as needed. Pt will require post acute rehab.     Follow Up Recommendations  CIR;Supervision/Assistance - 24 hour    Equipment Recommendations  Other (comment)(TBA)    Recommendations for Other Services      Precautions / Restrictions Precautions Precautions: Fall Precaution Comments: LVAD, sternal       Mobility Bed Mobility               General bed mobility comments: total A +2  Transfers                 General transfer comment: not attmepted    Balance                                           ADL either performed or assessed with clinical judgement   ADL Overall ADL's : Needs assistance/impaired                                        General ADL Comments: total A this session     Vision       Perception     Praxis      Cognition Arousal/Alertness: Lethargic Behavior During Therapy: Flat affect Overall Cognitive Status: Impaired/Different from baseline Area of Impairment: Orientation;Attention;Memory;Following commands;Safety/judgement;Awareness;Problem solving                 Orientation Level: Disoriented to;Place;Time;Situation Current Attention Level: Focused Memory: Decreased recall of precautions;Decreased short-term memory Following Commands: Follows one step commands inconsistently Safety/Judgement: Decreased awareness of safety;Decreased awareness of deficits Awareness: Intellectual Problem Solving: Slow processing;Decreased initiation;Difficulty sequencing;Requires verbal cues;Requires tactile cues General Comments: inconsistently following commands; Attmepting to assess LUE and pt appeared to say "it does it's own thing" Inattention to [facility]        Exercises Exercises: General Upper Extremity General Exercises - Upper Extremity Shoulder Flexion: Both;10 reps;Supine;AAROM;AROM(to 90) Shoulder ABduction: AROM;AAROM;PROM;Both;10 reps;Supine Elbow Flexion: Both;10 reps;Supine;AROM;AAROM;PROM Elbow Extension: PROM;AAROM;AROM;Both;10 reps;Supine Digit Composite Flexion: PROM;AROM;AAROM;Both;10 reps;Supine General Exercises - Lower Extremity Ankle Circles/Pumps: PROM;Both;5 reps;Supine Heel Slides: PROM;Both;5 reps;Supine Hip ABduction/ADduction: PROM;Both;5  reps;Supine   Shoulder Instructions       General Comments  Pt attempting to follow commands at times with RUE; no command follow with LUE although moving spontaneously; ? If sensory involvement; unable to maintain grasp on cloth with either hand to bring to face    Pertinent Vitals/ Pain       Pain Assessment: Faces Faces Pain Scale: Hurts a little bit Pain Location: general  discomfort Pain Descriptors / Indicators: Grimacing Pain Intervention(s): Limited activity within patient's tolerance  Home Living                                          Prior Functioning/Environment              Frequency  Min 2X/week        Progress Toward Goals  OT Goals(current goals can now be found in the care plan section)  Progress towards OT goals: OT to reassess next treatment  Acute Rehab OT Goals Patient Stated Goal: pt unable to state OT Goal Formulation: Patient unable to participate in goal setting Time For Goal Achievement: 04/05/20 Potential to Achieve Goals: Fair ADL Goals Pt Will Perform Grooming: with supervision;sitting Pt Will Perform Upper Body Dressing: with set-up;with supervision;sitting Pt Will Perform Lower Body Dressing: with min assist;with adaptive equipment;sit to/from stand Pt Will Transfer to Toilet: stand pivot transfer;with min assist Pt Will Perform Toileting - Clothing Manipulation and hygiene: with min assist;sit to/from stand Additional ADL Goal #1: Pt will perform bed mobility with minA as precursor to EOB/OOB ADL. Additional ADL Goal #2: Pt will demonstrate ability to manage LVAD components with no more than min cues. Additional ADL Goal #3: Pt will tolerate sitting EOB >5 min at supervision level as precursor to ADL.  Plan Discharge plan remains appropriate    Co-evaluation                 AM-PAC OT "6 Clicks" Daily Activity     Outcome Measure   Help from another person eating meals?: Total Help from another person taking care of personal grooming?: Total Help from another person toileting, which includes using toliet, bedpan, or urinal?: Total Help from another person bathing (including washing, rinsing, drying)?: Total Help from another person to put on and taking off regular upper body clothing?: Total Help from another person to put on and taking off regular lower body clothing?: Total 6  Click Score: 6    End of Session Equipment Utilized During Treatment: Oxygen(NRB; CCRT)  OT Visit Diagnosis: Other abnormalities of gait and mobility (R26.89);Muscle weakness (generalized) (M62.81);Other symptoms and signs involving cognitive function;Pain Pain - part of body: (general discomfort)   Activity Tolerance Patient limited by lethargy;Patient limited by fatigue   Patient Left in bed;with call bell/phone within reach   Nurse Communication Mobility status        Time: 0932-6712 OT Time Calculation (min): 18 min  Charges: OT General Charges $OT Visit: 1 Visit OT Treatments $Therapeutic Exercise: 8-22 mins  Maurie Boettcher, OT/L   Acute OT Clinical Specialist [place] Pager [phone] Office [phone]    [facility] 03/27/2020, 2:19 PM

## 2020-03-27 NOTE — Progress Notes (Signed)
CSW met at bedside and patient's daughter observing VAD Coordinator complete dressing change. CSW did not want to interrupt and will follow up tomorrow at bedside. [name], [place], [place]

## 2020-03-27 NOTE — Progress Notes (Signed)
Patient ID: [name] [name], male   DOB: 27-Jan-1951, 69 y.o.   MRN: [id number] [facility] Progress Note   Assessment/ Plan:   1.  Acute kidney injury on chronic kidney disease stage III: Anuric and without renal recovery.  Etiology suspected to be cardiorenal from CHF exacerbation/cardiogenic shock based on history/timeline of events.  Continue CRRT at this time for management of azotemia and volume unloading-tolerating ultrafiltration of net -150 cc/h.  CVP this morning 17-  5 kg / 12 pound weight loss overnight. 2.  Acute exacerbation of chronic congestive heart failure: With cardiogenic shock-on milrinone and now on CRRT for extracorporeal volume unloading after failure to respond to diuretics. 3.  Mitral valve prolapse status post mitral valve repair 4.  Atrial fibrillation status post MAC/LAA clipping 5.  Acute hypoxic respiratory failure: Secondary to worsening congestive heart failure, remains on BiPAP.  Continue volume unloading with CRRT.  Subjective:   Without acute events overnight, tolerating CRRT without problems with mild hypotension noted   Objective:   BP 103/79   Pulse 85   Temp (!) 96.8 F (36 C)   Resp 16   Ht 6' (1.829 m)   Wt 112.4 kg   SpO2 94%   BMI 33.61 kg/m   Intake/Output Summary (Last 24 hours) at 03/27/2020 0738 Last data filed at 03/27/2020 0700 Gross per 24 hour  Intake 2522.97 ml  Output 5539 ml  Net -3016.03 ml   Weight change: -5.5 kg  Physical Exam: Gen: On BiPAP, sleepy and appears comfortable CVS: Pulse irregular tachycardia, S1 and S2 normal Resp: Anteriorly clear to auscultation, no rales/rhonchi.  LVAD driveline dressing noted. Abd: Soft, flat, nontender Ext: No lower extremity edema  Imaging: DG Chest Port 1 View  Result Date: 03/27/2020 CLINICAL DATA:  Chest tube.  Left ventricular assist device. EXAM: PORTABLE CHEST 1 VIEW COMPARISON:  03/26/2020 FINDINGS: Interim placement of feeding tube, its tip is below left  hemidiaphragm. Interim removal Swan-Ganz catheter and mediastinal drainage catheters. Right IJ sheath in stable position. Right subclavian line stable position. Left chest tube in stable position. Cardiac pacer left ventricular assist device in stable position. Left atrial appendage clip again noted. Stable cardiomegaly. Unchanged bilateral interstitial prominence suggesting interstitial edema. Pneumonitis cannot be excluded. Tiny left pleural effusion cannot be excluded. No pneumothorax. IMPRESSION: 1. Interim placement of feeding tube, its tip is below left hemidiaphragm. Interim removal Swan-Ganz catheter and mediastinal drainage catheters. Right IJ sheath in stable position. Right subclavian line in stable position. Left chest tube in stable position. No pneumothorax. 2. Cardiac pacer left ventricular assist device in stable position. Stable cardiomegaly. 3. Unchanged bilateral interstitial prominence suggesting interstitial edema. Tiny left pleural effusion cannot be excluded. Electronically Signed   By: Marcello Moores  Register   On: 03/27/2020 07:26   DG Chest Port 1 View  Result Date: 03/26/2020 CLINICAL DATA:  Status post cardiac surgery. EXAM: PORTABLE CHEST 1 VIEW COMPARISON:  March 25, 2020. FINDINGS: Stable cardiomegaly. Left-sided pacemaker is unchanged in position. Stable position of left ventricular assist device. Right subclavian catheter is unchanged in position. Right internal jugular Swan-Ganz catheter is noted with tip in expected position of main pulmonary artery. Left-sided chest tube is noted without pneumothorax. Mild bilateral lung opacities are noted concerning for edema. Probable small left pleural effusion and left basilar atelectasis is noted. Bony thorax is unremarkable. IMPRESSION: Stable support apparatus. No pneumothorax is noted. Mild bilateral lung opacities are noted concerning for edema. Probable small left pleural effusion and left basilar  atelectasis is noted. Electronically  Signed   By: Marijo Conception M.D.   On: 03/26/2020 09:47   DG CHEST PORT 1 VIEW  Result Date: 03/25/2020 CLINICAL DATA:  Post hemodialysis catheter insertion. EXAM: PORTABLE CHEST 1 VIEW COMPARISON:  03/25/2020 FINDINGS: Left-sided pacemaker and left ventricular assist device unchanged. Right IJ Swan-Ganz catheter with tip over the main pulmonary artery segment unchanged. Interval placement of right subclavian central venous catheter with tip at the cavoatrial junction. Catheter from below with tip just right of the Swan-Ganz catheter tip in the main pulmonary artery segment unchanged. Lungs are adequately inflated demonstrate persistent hazy bilateral perihilar opacification with interval improvement. Persistent bibasilar opacification without significant change. Pneumothorax. Stable cardiomegaly. Remainder of the exam is unchanged. IMPRESSION: 1. Stable cardiomegaly with interval improvement and hazy bilateral perihilar opacification. Stable bibasilar airspace density. 2. Tubes and lines as described. New right subclavian central venous catheter with tip over the SVC. No pneumothorax. Electronically Signed   By: Marin Olp M.D.   On: 03/25/2020 09:20   DG Abd Portable 1V  Result Date: 03/26/2020 CLINICAL DATA:  Feeding tube. EXAM: PORTABLE ABDOMEN - 1 VIEW COMPARISON:  None. FINDINGS: Distal tip of feeding tube appears to be in proximal stomach. Visualized bowel loops are unremarkable. IMPRESSION: Distal tip of feeding tube appears to be in proximal stomach. Electronically Signed   By: Marijo Conception M.D.   On: 03/26/2020 11:49    Labs: [employer] Recent Labs  Lab 03/23/20 0335 03/23/20 0335 03/23/20 1523 03/23/20 1523 03/24/20 0343 03/24/20 0404 03/25/20 0303 03/25/20 0512 03/25/20 1532 03/25/20 1532 03/25/20 1536 03/26/20 0356 03/26/20 0358 03/26/20 1304 03/26/20 1536 03/27/20 0343 03/27/20 0345  NA 134*   < > 135   < > 134*   < > 130*   < > 134*   < > 132* 134* 133* 135 135 138 138   K 3.5   < > 3.4*   < > 3.4*   < > 3.5   < > 4.0   < > 3.9 4.0 4.0 3.9 4.0 3.5 3.5  CL 98   < > 99  --  97*  --  94*  --  97*  --   --  99  --   --  100 103  --   CO2 21*   < > 20*  --  20*  --  18*  --  18*  --   --  20*  --   --  21* 24  --   GLUCOSE 116*   < > 118*  --  108*  --  178*  --  150*  --   --  175*  --   --  184* 129*  --   BUN 40*   < > 45*  --  54*  --  71*  --  70*  --   --  57*  --   --  49* 42*  --   CREATININE 2.88*   < > 3.20*  --  3.57*  --  3.98*  --  3.71*  --   --  3.18*  --   --  2.75* 2.44*  --   CALCIUM 8.9   < > 8.4*  --  8.6*  --  8.4*  --  8.5*  --   --  8.4*  --   --  8.5* 8.3*  --   PHOS 5.7*  --   --   --  5.6*  --  5.4*  --  5.1*  --   --  4.3  --   --  3.5 2.6  --    < > = values in this interval not displayed.   CBC Recent Labs  Lab 03/24/20 0343 03/24/20 0404 03/25/20 0303 03/25/20 0512 03/26/20 0356 03/26/20 0356 03/26/20 0358 03/26/20 1304 03/27/20 0343 03/27/20 0345  WBC 16.0*  --  11.1*  --  6.8  --   --   --  10.4  --   NEUTROABS 14.1*  --  9.2*  --  5.5  --   --   --  8.9*  --   HGB 9.9*   < > 9.1*   < > 9.0*   < > 8.5* 9.9* 8.9* 9.2*  HCT 30.0*   < > 27.8*   < > 26.9*   < > 25.0* 29.0* 27.2* 27.0*  MCV 91.7  --  90.8  --  90.9  --   --   --  90.7  --   PLT 158  --  143*  --  125*  --   --   --  139*  --    < > = values in this interval not displayed.    Medications:    . sodium chloride   Intravenous Once  . aspirin  81 mg Per Tube Daily  . atorvastatin  80 mg Per Tube q1800  . B-complex with vitamin C  1 tablet Per Tube Daily  . bisacodyl  10 mg Oral Daily   Or  . bisacodyl  10 mg Rectal Daily  . chlorhexidine  15 mL Mouth Rinse BID  . Chlorhexidine Gluconate Cloth  6 each Topical Daily  . docusate sodium  200 mg Oral Daily  . feeding supplement (PRO-STAT SUGAR FREE 64)  30 mL Per Tube Daily  . influenza vaccine adjuvanted  0.5 mL Intramuscular Tomorrow-1000  . insulin aspart  0-24 Units Subcutaneous Q4H  . mouth rinse  15  mL Mouth Rinse BID  . mouth rinse  15 mL Mouth Rinse q12n4p  . pantoprazole (PROTONIX) IV  40 mg Intravenous Daily  . sildenafil  20 mg Per Tube TID  . sodium chloride flush  10-40 mL Intracatheter Q12H  . sodium chloride flush  3 mL Intravenous Q12H  . Warfarin - Pharmacist Dosing Inpatient   Does not apply q1600   Elmarie Shiley, MD 03/27/2020, 7:38 AM

## 2020-03-27 NOTE — Anesthesia Postprocedure Evaluation (Signed)
Anesthesia Post Note  Patient: [name] [name]  Procedure(s) Performed: REDO STERNOTOMY WITH STERNAL PLATING (N/A ) INSERTION OF IMPLANTABLE LEFT VENTRICULAR ASSIST DEVICE - HM3 (N/A Chest) MAZE (N/A ) TRANSESOPHAGEAL ECHOCARDIOGRAM (TEE) (N/A ) Clipping Of Atrial Appendage (Left )     Patient location during evaluation: SICU Anesthesia Type: General Level of consciousness: sedated and patient remains intubated per anesthesia plan Pain management: pain level controlled Vital Signs Assessment: post-procedure vital signs reviewed and stable Respiratory status: patient remains intubated per anesthesia plan and patient on ventilator - see flowsheet for VS Cardiovascular status: stable Anesthetic complications: no    Last Vitals:  Vitals:   03/27/20 0645 03/27/20 0700  BP:  103/79  Pulse:  85  Resp: 12 16  Temp:    SpO2:  94%    Last Pain:  Vitals:   03/26/20 2300  TempSrc: Axillary  PainSc:                  [name] [name]

## 2020-03-27 NOTE — Progress Notes (Signed)
LVAD Coordinator Rounding Note:  HM III LVAD implanted on 04/02/2020 by Dr Orvan Seen under destination therapy criteria.  Pt lying in bed, on bipap. Does not follow commands.   Paged earlier about pts controller beeping while on batteries. Nurses have changed the clips and batteries but pt continues to beep while on batteries. I was able to reciporcate the beeps. The beep is coming from the black lead. New clips and batteries placed. Left pt on new batteries and clips for 30 minutes with no more beeping. Replaced batteries and clips on the cart for backup.  Pt taken for CT of head this morning for continued delirium. New CVA identified on head CT today. Neuro consulted.  Vital signs: HR: 105 afib Doppler Pressure: 100 Art line: 105/69 (78)  Automatic BP: 120/82 (93) O2 Sat: 93% on 15 HFNC Wt: 253.3>262.3>260.8>259.9 lbs   LVAD interrogation reveals:  Speed: 5300 Flow: 4.1 Power: 3.7 PI: 4.6  Alarms: none Events:  3 PI today Hematocrit: 25  Fixed speed: 5300 Low speed limit: 5000   Drive Line: Existing VAD dressing removed and site care performed using sterile technique. Drive line exit site cleaned with Chlora prep applicators x 2, allowed to dry, and gauze dressing with silver strip re-applied. Exit site healing and unincorporated, the velour is fully implanted at exit site. Scant bloody drainage. No redness, tenderness, foul odor or rash noted. Drive line anchor secure. Every other day dressing changes per nurse champion or VAD coordinator. Dressing change due 03/29/20.    Labs:  LDH trend: 360>513>482>467>453  INR trend: 1.3>6.2>9>4.4>6.2  CR: 3.57>3.98>3.18>2.44  Anticoagulation Plan: -INR Goal: 2.0-2.5 -ASA Dose: 81 mg   Blood Products:  Intraop: 6 FFP  DDAVP  420 cell saver  03/24/2020>>4 FFP 03/21/20>> 1 RBC 03/22/20>>1 RBC  Device: -St [name] -Therapies: VF 194  Drips: Amiodarone 30 mg/hr Epinephrine 1 mcg/min-off 03/25/20 Lasix 15 mg/hr- off 03/25/20 Insulin 2.8  U/HR- off Milrinone 0.375 mcg/kg/min Levophed-- off  Respiratory:  on nitric 5 ppm- off today  Adverse Events on VAD: >>03/25/20 CVVH Started  Pt Education:  1. Pt is difficult to arouse at this time on bipap. Education inappropriate at this time.  2. Daughter-[name] observed driveline dressing change today. [name] will practice donning/doffing sterile gloves.    Plan/Recommendations:  1. Call VAD coordinator for any equipment or drive line issues.  2. Every other day drive line dressing change per nurse champion or VAD coordinator.   Tanda Rockers RN [facility] Coordinator  Office: [phone]  24/7 Pager: [phone]

## 2020-03-27 NOTE — Consult Note (Signed)
NAME:  [name] [name], MRN:  [id number], DOB:  [birth year], LOS: 51 ADMISSION DATE:  02/24/2020, CONSULTATION DATE:  03/27/20 REFERRING MD:  Aundra Dubin, CHIEF COMPLAINT:  Respiratory failure   Brief History   69 year old man with advanced ischemic cardiomyopathy, prior mitral valve repair, afib presented for redo sternotomy, LVAD implantation, left atrial appendage clipping, and sternal reconstruction on 04/05/2020.  PCCM consulted on 4/15 for persistent respiratory failure.  History of present illness   69 year old man with advanced ischemic cardiomyopathy, prior mitral valve repair, afib presented for redo sternotomy, LVAD implantation, left atrial appendage clipping, and sternal reconstruction on 03/25/2020.    Postoperative course complicated by persistent delirium, RV dysfunction, renal failure, abnormal INR of unclear etiology.  Now on CRRT.  Over past several days has become less responsive.   Went for head CT this AM showing acute posterior R MCA stroke.  Respiratory status has been tenuous post op as well.  Has remained mostly on BIPAP.  History per chart review as patient is nonverbal for me.  Past Medical History  Ischemic cardiomyopathy MV prolapse s/p repair in 2004 HTN HLD CKD  Significant Hospital Events   4/8: redo sternotomy, LVAD implantation, left atrial appendage clipping, and sternal reconstruction  4/9: extubated  Consults:  CHF, TCTS, PCCM, Nephro, Neuro, Hematology  Procedures:  4/8: redo sternotomy, LVAD implantation, left atrial appendage clipping, and sternal reconstruction   RIJ introducer R [place] HD catheter R radial A line R/L pleural drains  Significant Diagnostic Tests:  CT Head 03/27/20 IMPRESSION: Large posterior right MCA infarct, new since prior study.  CT a/p 03/27/2020 IMPRESSION: 1. No acute intra-abdominal or pelvic pathology. No bowel obstruction. Normal appendix. 2. Cirrhosis with small perihepatic ascites. 3. Cholelithiasis. No evidence of  acute cholecystitis by CT. 4. Aortic Atherosclerosis (ICD10-I70.0).  4/15 CXR volume overload  Echo 4/10 1. Images are poor quality and cannot comment on global LV function. .  Left ventricular ejection fraction, by estimation, is 20 to 25%. The left  ventricle has severely decreased function. The left ventricle demonstrates  global hypokinesis. There is  moderate concentric left ventricular hypertrophy.  2. Aortic dilatation noted. There is mild dilatation of the ascending  aorta and of the aortic root measuring 41 mm and 57mm respectively.  3. Right ventricular systolic function is severely reduced. The right  ventricular size is moderately enlarged.  4. The aortic valve is tricuspid. Mild aortic valve sclerosis is present,  with no evidence of aortic valve stenosis.   Micro Data:  COVID swab neg on admission MRSA PCR neg 4/7 Blood cultures x 2 4/13 neg Urine culture 4/12 neg  Antimicrobials:  Vanc 4/12 - 4/19 (planned) Cefepime 4/11 - 4/19 (planned)   Interim history/subjective:  Consulted  Objective   Blood pressure (!) 128/111, pulse 97, temperature (!) 96 F (35.6 C), temperature source Axillary, resp. rate 14, height 6' (1.829 m), weight 112.4 kg, SpO2 95 %. CVP:  [7 mmHg-25 mmHg] 10 mmHg  Vent Mode: BIPAP FiO2 (%):  [50 %] 50 % Set Rate:  [16 bmp] 16 bmp PEEP:  [5 cmH20] 5 cmH20   Intake/Output Summary (Last 24 hours) at 03/27/2020 0944 Last data filed at 03/27/2020 0900 Gross per 24 hour  Intake 2588.42 ml  Output 5263 ml  Net -2674.58 ml   Filed Weights   03/25/20 0317 03/26/20 0424 03/27/20 0500  Weight: 118.3 kg 117.9 kg 112.4 kg    Examination: GEN: ill appearing man on BIPAP HEENT: BIPAP  in place with good seal, trachea midline CV: sternotomy appears CDI, pleural tubes in place with minimal serosanguinous output, LVAD hum PULM: Suprisingly clear, no wheezing or accessory  Muscle use GI: soft, nondistended, hypoactive BS EXT: trace  edema NEURO: he will move all 4 ext to command with repeated stimulus, lots of writhing around [facility]: agitated SKIN: no rashes, lines appear CDI  ABG 4/15 shows normal gas exchange LFTs and BMP look good LDH 453 BNP 425 WBC 10.4 HgB 9 Plts 139 INR 6.2  Resolved Hospital Problem list   N/A  Assessment & Plan:  Postoperative hypoxemic respiratory failure- due to volume overloaded state of heart, RV failure. and delirium limiting pulmonary hygeine - Continue to offload fluid as able by CRRT - Trials of NRB and HFNC today, BIPAP in between - Limit sedating meds as able  S/P LVAD- management per TCTS and CHF teams  Coagulopathy on warfarin- has cirrhosis on CT abdomen which may be contributing but this was not an issue pre-op.  Unclear if INR is true reflection of degree of AC.  Hematology consult pending.  Right MCA ischemic stroke- neuro to see, looks fairly old  Delirium- persistent issue post op, unclear how much stroke contributed to this.  QTc prolonged limiting options to treat this - Awake during night, sleep at night - Start melatonin qHS  Best practice:  Diet: TF Pain/Anxiety/Delirium protocol (if indicated): Hold if able VAP protocol (if indicated): N/A DVT prophylaxis: Warfarin GI prophylaxis: PPI Glucose control: SSI Mobility: BR Code Status: partial Family Communication: per primary Disposition: ICU  Labs   CBC: Recent Labs  Lab 03/23/20 0335 03/23/20 0335 03/24/20 0343 03/24/20 0404 03/25/20 0303 03/25/20 3532 03/26/20 0356 03/26/20 0358 03/26/20 1304 03/27/20 0343 03/27/20 0345  WBC 17.8*  --  16.0*  --  11.1*  --  6.8  --   --  10.4  --   NEUTROABS 16.0*  --  14.1*  --  9.2*  --  5.5  --   --  8.9*  --   HGB 10.5*   < > 9.9*   < > 9.1*   < > 9.0* 8.5* 9.9* 8.9* 9.2*  HCT 31.7*   < > 30.0*   < > 27.8*   < > 26.9* 25.0* 29.0* 27.2* 27.0*  MCV 92.2  --  91.7  --  90.8  --  90.9  --   --  90.7  --   PLT 139*  --  158  --  143*  --  125*  --    --  139*  --    < > = values in this interval not displayed.    Basic Metabolic Panel: Recent Labs  Lab 03/23/20 0335 03/23/20 1523 03/24/20 0343 03/24/20 0404 03/25/20 0303 03/25/20 0512 03/25/20 1532 03/25/20 1536 03/26/20 0356 03/26/20 0356 03/26/20 0358 03/26/20 1304 03/26/20 1536 03/27/20 0343 03/27/20 0345  NA 134*   < > 134*   < > 130*   < > 134*   < > 134*   < > 133* 135 135 138 138  K 3.5   < > 3.4*   < > 3.5   < > 4.0   < > 4.0   < > 4.0 3.9 4.0 3.5 3.5  CL 98   < > 97*   < > 94*  --  97*  --  99  --   --   --  100 103  --   CO2 21*   < >  20*   < > 18*  --  18*  --  20*  --   --   --  21* 24  --   GLUCOSE 116*   < > 108*   < > 178*  --  150*  --  175*  --   --   --  184* 129*  --   BUN 40*   < > 54*   < > 71*  --  70*  --  57*  --   --   --  49* 42*  --   CREATININE 2.88*   < > 3.57*   < > 3.98*  --  3.71*  --  3.18*  --   --   --  2.75* 2.44*  --   CALCIUM 8.9   < > 8.6*   < > 8.4*  --  8.5*  --  8.4*  --   --   --  8.5* 8.3*  --   MG 2.1  --  2.2  --  2.3  --   --   --  2.5*  --   --   --   --  2.6*  --   PHOS 5.7*  --  5.6*   < > 5.4*  --  5.1*  --  4.3  --   --   --  3.5 2.6  --    < > = values in this interval not displayed.   GFR: Estimated Creatinine Clearance: 36.5 mL/min (A) (by C-G formula based on SCr of 2.44 mg/dL (H)). Recent Labs  Lab 03/23/20 0335 03/23/20 0944 03/24/20 0343 03/25/20 0303 03/26/20 0356 03/27/20 0343  PROCALCITON  --  1.96  --   --   --   --   WBC   < >  --  16.0* 11.1* 6.8 10.4   < > = values in this interval not displayed.    Liver Function Tests: Recent Labs  Lab 03/22/20 1454 03/22/20 1454 03/23/20 0335 03/23/20 0335 03/23/20 1523 03/23/20 1523 03/24/20 0343 03/25/20 1532 03/26/20 0356 03/26/20 1536 03/27/20 0343  AST 90*  --  70*  --  64*  --  62*  --   --   --  38  ALT 27  --  24  --  24  --  26  --   --   --  25  ALKPHOS 67  --  78  --  74  --  72  --   --   --  94  BILITOT 2.4*  --  2.0*  --  2.2*  --   1.5*  --   --   --  1.3*  PROT 6.0*  --  5.9*  --  5.9*  --  6.1*  --   --   --  5.7*  ALBUMIN 3.2*   < > 3.0*   < > 2.9*   < > 3.0* 2.7* 2.4* 2.5* 2.6*  2.5*   < > = values in this interval not displayed.   No results for input(s): LIPASE, AMYLASE in the last 168 hours. Recent Labs  Lab 03/24/20 0719  AMMONIA 32    ABG    Component Value Date/Time   PHART 7.440 03/27/2020 0345   PCO2ART 36.2 03/27/2020 0345   PO2ART 103.0 03/27/2020 0345   HCO3 24.9 03/27/2020 0345   TCO2 26 03/27/2020 0345   ACIDBASEDEF 2.0 03/26/2020 0358   O2SAT 64.7 03/27/2020 0348     Coagulation Profile:  Recent Labs  Lab 03/24/20 0343 03/24/20 0452 03/25/20 0303 03/26/20 0356 03/27/20 0343  INR 6.2* 6.2* 9.0* 4.4* 6.2*    Cardiac Enzymes: No results for input(s): CKTOTAL, CKMB, CKMBINDEX, TROPONINI in the last 168 hours.  HbA1C: Hgb A1c MFr Bld  Date/Time Value Ref Range Status  03/26/2020 03:52 AM 10.5 (H) 4.8 - 5.6 % Final    Comment:    (NOTE) Pre diabetes:          5.7%-6.4% Diabetes:              >6.4% Glycemic control for   <7.0% adults with diabetes   03/24/2020 01:12 PM 10.7 (H) 4.8 - 5.6 % Final    Comment:    (NOTE) Pre diabetes:          5.7%-6.4% Diabetes:              >6.4% Glycemic control for   <7.0% adults with diabetes     CBG: Recent Labs  Lab 03/26/20 1536 03/26/20 1946 03/26/20 2334 03/27/20 0342 03/27/20 0741  GLUCAP 179* 193* 153* 125* 173*    Review of Systems:   Cannot assess.  Past Medical History  He,  has a past medical history of Anxiety, Arthritis, CAD (coronary artery disease), Chronic combined systolic and diastolic CHF (congestive heart failure) ([place]), CKD (chronic kidney disease), stage III, Depression, ED (erectile dysfunction), GERD (gastroesophageal reflux disease), Hyperlipidemia, Hypertension, Ischemic cardiomyopathy, MVP (mitral valve prolapse), Persistent atrial fibrillation ([place]), and Type II diabetes mellitus ([place]).    Surgical History    Past Surgical History:  Procedure Laterality Date  . CARDIAC DEFIBRILLATOR PLACEMENT  2007   implanted in [place], has a 7001 RV lead and a SJM Atlas ICD followed by Dr Caryl Comes  . CARDIOVERSION N/A 09/29/2016   Procedure: CARDIOVERSION;  Surgeon: Lelon Perla, MD;  Location: [facility] ENDOSCOPY;  Service: Cardiovascular;  Laterality: N/A;  . CARDIOVERSION N/A 12/03/2016   Procedure: CARDIOVERSION;  Surgeon: Dorothy Spark, MD;  Location: [place];  Service: Cardiovascular;  Laterality: N/A;  . CLIPPING OF ATRIAL APPENDAGE Left 04/09/2020   Procedure: Clipping Of Atrial Appendage;  Surgeon: Wonda Olds, MD;  Location: [place];  Service: Open Heart Surgery;  Laterality: Left;  . IMPLANTABLE CARDIOVERTER DEFIBRILLATOR (ICD) GENERATOR CHANGE N/A 03/19/2013   SJM Fortify ST DR generator placed by Dr Lovena Le, part of Analyze ST study  . INSERTION OF IMPLANTABLE LEFT VENTRICULAR ASSIST DEVICE N/A 03/14/2020   Procedure: INSERTION OF IMPLANTABLE LEFT VENTRICULAR ASSIST DEVICE - HM3;  Surgeon: Wonda Olds, MD;  Location: [place];  Service: Open Heart Surgery;  Laterality: N/A;  . LEFT AND RIGHT HEART CATHETERIZATION WITH CORONARY ANGIOGRAM N/A 01/14/2015   Procedure: LEFT AND RIGHT HEART CATHETERIZATION WITH CORONARY ANGIOGRAM;  Surgeon: Peter M Martinique, MD;  [facility] C[facility] LAB;  Service: Cardiovascular;  Laterality: N/A;  . LEFT HEART CATHETERIZATION WITH CORONARY ANGIOGRAM N/A 02/17/2012   Procedure: LEFT HEART CATHETERIZATION WITH CORONARY ANGIOGRAM;  Surgeon: Jolaine Artist, MD;  Location: [facility] CATH LAB;  Service: Cardiovascular;  Laterality: N/A;  . MAZE N/A 03/29/2020   Procedure: MAZE;  Surgeon: Wonda Olds, MD;  Location: [place];  Service: Open Heart Surgery;  Laterality: N/A;  . MITRAL VALVE ANNULOPLASTY  2004   Archie Endo 02/17/2012  . MULTIPLE EXTRACTIONS WITH ALVEOLOPLASTY N/A 03/28/2020   Procedure: Extraction of tooth #'s 20,21,22 and 23 with alveoloplasaty;   Surgeon: Lenn Cal, DDS;  Location: [place];  Service: Oral Surgery;  Laterality: N/A;  . RIGHT/LEFT HEART CATH AND CORONARY ANGIOGRAPHY N/A 02/21/2020   Procedure: RIGHT/LEFT HEART CATH AND CORONARY ANGIOGRAPHY;  Surgeon: Burnell Blanks, MD;  Location: [place] CV LAB;  Service: Cardiovascular;  Laterality: N/A;  . TEE WITHOUT CARDIOVERSION N/A 09/29/2016   Procedure: TRANSESOPHAGEAL ECHOCARDIOGRAM (TEE);  Surgeon: Lelon Perla, MD;  Location: [facility] ENDOSCOPY;  Service: Cardiovascular;  Laterality: N/A;  . TEE WITHOUT CARDIOVERSION N/A 12/03/2016   Procedure: TRANSESOPHAGEAL ECHOCARDIOGRAM (TEE);  Surgeon: Dorothy Spark, MD;  Location: [place];  Service: Cardiovascular;  Laterality: N/A;  . TEE WITHOUT CARDIOVERSION N/A 03/19/2020   Procedure: TRANSESOPHAGEAL ECHOCARDIOGRAM (TEE);  Surgeon: Wonda Olds, MD;  Location: [place];  Service: Open Heart Surgery;  Laterality: N/A;     Social History   reports that he has never smoked. He has never used smokeless tobacco. He reports current alcohol use. He reports that he does not use drugs.   Family History   His family history includes Colon cancer in his mother and sister; Coronary artery disease in his brother and mother; Heart attack in his mother; Heart disease in his brother and mother; Heart failure in his mother; Hypertension in his mother; Malignant hyperthermia in his mother.   Allergies Allergies  Allergen Reactions  . Liraglutide Nausea And Vomiting  . Lisinopril Cough     Home Medications  Prior to Admission medications   Medication Sig Start Date End Date Taking? Authorizing Provider  ALPRAZolam (XANAX) 0.25 MG tablet Take 0.25 mg by mouth 3 (three) times daily as needed for anxiety.  02/07/20  Yes [provider]  amiodarone (PACERONE) 200 MG tablet Take 200 mg by mouth daily.  02/18/20  Yes [provider]  apixaban (ELIQUIS) 5 MG TABS tablet Take 1 tablet (5 mg total) by mouth 2  (two) times daily. 01/08/20  Yes Eugenie Filler, MD  atorvastatin (LIPITOR) 40 MG tablet Take 1 tablet (40 mg total) by mouth daily at 6 PM. 01/08/20  Yes Eugenie Filler, MD  citalopram (CELEXA) 20 MG tablet Take 20 mg by mouth daily.  02/07/20  Yes [provider]  furosemide (LASIX) 40 MG tablet Take 40 mg by mouth daily.  02/01/20  Yes [provider]  insulin aspart (NOVOLOG FLEXPEN) 100 UNIT/ML FlexPen Inject 6 Units into the skin 3 (three) times daily with meals. 01/08/20  Yes Eugenie Filler, MD  Insulin Glargine (BASAGLAR KWIKPEN) 100 UNIT/ML SOPN Inject 0.5 mLs (50 Units total) into the skin daily. 01/08/20  Yes Eugenie Filler, MD  metoprolol succinate (TOPROL-XL) 100 MG 24 hr tablet Take 1 tablet (100 mg total) by mouth daily. 03/07/20  Yes Revankar, Reita Cliche, MD  nitroGLYCERIN (NITROSTAT) 0.4 MG SL tablet Place 1 tablet (0.4 mg total) under the tongue every 5 (five) minutes x 3 doses as needed for chest pain. 01/08/20  Yes Eugenie Filler, MD  pantoprazole (PROTONIX) 40 MG tablet Take 1 tablet (40 mg total) by mouth daily. 01/09/20  Yes Eugenie Filler, MD  potassium chloride SA (KLOR-CON) 20 MEQ tablet Take 20 mEq by mouth daily.  02/01/20  Yes [provider]  tamsulosin (FLOMAX) 0.4 MG CAPS capsule Take 1 capsule (0.4 mg total) by mouth daily. 01/08/20  Yes Eugenie Filler, MD  ALPRAZolam Duanne Moron) 1 MG tablet Take 1 tablet (1 mg total) by mouth 2 (two) times daily as needed for anxiety. Patient not taking: Reported on 03/04/2020 01/08/20   Eugenie Filler, MD  Critical care time: 40 minutes not including any separately billable procedures

## 2020-03-27 NOTE — Consult Note (Signed)
[place]  [phone] [phone]    INITIAL HEMATOLOGY CONSULTATION  Referring MD:  Dr. Fredrich Romans  Reason for Referral: Elevated INR  HPI: [name] [name] is a 69 year old male with a past medical history significant for advanced ischemic cardiomyopathy, prior mitral valve repair, atrial fibrillation, hypertension, hyperlipidemia, CKD, depression, and cirrhosis noted on CT scan dated 03/25/2020.  The patient presented for redo sternotomy, LVAD implantation, left atrial appendage clipping, and sternal reconstruction on 04/07/2020.  His postop course has been complicated with persistent delirium, RV dysfunction, renal failure requiring CRRT, and abnormal INR.  PT today is 55.4 and INR 6.2.  Review of his records show his INR on 03/28/2020 was 1.3.  He received heparin from 03/08/2020 through 04/08/2020.  He received warfarin 2 mg on 03/21/2020, 2 mg on 03/22/2020 and 0.5 mg on 03/23/2020.  He has not received any additional warfarin since this date.  He received vitamin K 1 mg IV on 03/25/2020 and received another dose of vitamin K 1 mg IV today.  Today, the patient is alert but does not fully engage in conversation with me.  Nursing is at the bedside who has not noticed any bleeding.  He has a chest tube in place which is not putting out a significant amount of blood.  The patient offers no specific complaints to me today.  Hematology was asked to the patient to make recommendations regarding his elevated INR.  Past Medical History:  Diagnosis Date  . Anxiety   . Arthritis   . CAD (coronary artery disease)    a.  1993 s/p MI - [employer];  b. s/p BMS to LAD '00;  c. PTCA 2nd diagonal 2010;  d. 02/18/12 Cath: moderate nonobs dzs - med rx;  e.  01/2015 Cath: LM nl, LAD 40-2m ISR, 70-72m/d, d1 90p (3.0x16 Synergy DES), D2 50-60, LCX nl, OM1 50p, 28m (2.5x12 Synergy DES), RCA nl, EF 30-35%.  . Chronic combined systolic and diastolic CHF (congestive heart failure)  ([place])    a. 12/2014 Echo: EF 30-35%, Gr2 DD, mod MR, sev dil LA.  . CKD (chronic kidney disease), stage III   . Depression   . ED (erectile dysfunction)   . GERD (gastroesophageal reflux disease)   . Hyperlipidemia   . Hypertension   . Ischemic cardiomyopathy    a. s/p St. [name] (Atlas) ICD implanted in [place] 2007;  b. 12/2014 Echo: Ef 30-35%.  . MVP (mitral valve prolapse)    a. s/p MV annuloplasty at [facility] 2004.  Marland Kitchen Persistent atrial fibrillation ([place])    a. noted on ICD interrogation '10 - not previously on [place] - CHA2DS2VASc = 5.  . Type II diabetes mellitus ([place])    uncontrolled  :    Past Surgical History:  Procedure Laterality Date  . CARDIAC DEFIBRILLATOR PLACEMENT  2007   implanted in [place], has a 7001 RV lead and a SJM Atlas ICD followed by Dr Caryl Comes  . CARDIOVERSION N/A 09/29/2016   Procedure: CARDIOVERSION;  Surgeon: Lelon Perla, MD;  Location: [facility] ENDOSCOPY;  Service: Cardiovascular;  Laterality: N/A;  . CARDIOVERSION N/A 12/03/2016   Procedure: CARDIOVERSION;  Surgeon: Dorothy Spark, MD;  Location: [place];  Service: Cardiovascular;  Laterality: N/A;  . CLIPPING OF ATRIAL APPENDAGE Left 03/29/2020   Procedure: Clipping Of Atrial Appendage;  Surgeon: Wonda Olds, MD;  Location: [place];  Service: Open Heart Surgery;  Laterality: Left;  . IMPLANTABLE CARDIOVERTER DEFIBRILLATOR (ICD) GENERATOR  CHANGE N/A 03/19/2013   SJM Fortify ST DR generator placed by Dr Lovena Le, part of Analyze ST study  . INSERTION OF IMPLANTABLE LEFT VENTRICULAR ASSIST DEVICE N/A 04/03/2020   Procedure: INSERTION OF IMPLANTABLE LEFT VENTRICULAR ASSIST DEVICE - HM3;  Surgeon: Wonda Olds, MD;  Location: [place];  Service: Open Heart Surgery;  Laterality: N/A;  . LEFT AND RIGHT HEART CATHETERIZATION WITH CORONARY ANGIOGRAM N/A 01/14/2015   Procedure: LEFT AND RIGHT HEART CATHETERIZATION WITH CORONARY ANGIOGRAM;  Surgeon: Peter M Martinique, MD;  Location: [facility] C[facility] LAB;  Service:  Cardiovascular;  Laterality: N/A;  . LEFT HEART CATHETERIZATION WITH CORONARY ANGIOGRAM N/A 02/17/2012   Procedure: LEFT HEART CATHETERIZATION WITH CORONARY ANGIOGRAM;  Surgeon: Jolaine Artist, MD;  Location: [facility] CATH LAB;  Service: Cardiovascular;  Laterality: N/A;  . MAZE N/A 03/19/2020   Procedure: MAZE;  Surgeon: Wonda Olds, MD;  Location: [place];  Service: Open Heart Surgery;  Laterality: N/A;  . MITRAL VALVE ANNULOPLASTY  2004   Archie Endo 02/17/2012  . MULTIPLE EXTRACTIONS WITH ALVEOLOPLASTY N/A 03/30/2020   Procedure: Extraction of tooth #'s 20,21,22 and 23 with alveoloplasaty;  Surgeon: Lenn Cal, DDS;  Location: [place];  Service: Oral Surgery;  Laterality: N/A;  . RIGHT/LEFT HEART CATH AND CORONARY ANGIOGRAPHY N/A 02/26/2020   Procedure: RIGHT/LEFT HEART CATH AND CORONARY ANGIOGRAPHY;  Surgeon: Burnell Blanks, MD;  Location: [place] CV LAB;  Service: Cardiovascular;  Laterality: N/A;  . TEE WITHOUT CARDIOVERSION N/A 09/29/2016   Procedure: TRANSESOPHAGEAL ECHOCARDIOGRAM (TEE);  Surgeon: Lelon Perla, MD;  Location: [facility] ENDOSCOPY;  Service: Cardiovascular;  Laterality: N/A;  . TEE WITHOUT CARDIOVERSION N/A 12/03/2016   Procedure: TRANSESOPHAGEAL ECHOCARDIOGRAM (TEE);  Surgeon: Dorothy Spark, MD;  Location: [place];  Service: Cardiovascular;  Laterality: N/A;  . TEE WITHOUT CARDIOVERSION N/A 03/24/2020   Procedure: TRANSESOPHAGEAL ECHOCARDIOGRAM (TEE);  Surgeon: Wonda Olds, MD;  Location: [place];  Service: Open Heart Surgery;  Laterality: N/A;  :   CURRENT MEDS: Current Facility-Administered Medications  Medication Dose Route Frequency Provider Last Rate Last Admin  .  prismasol BGK 4/2.5 infusion   CRRT Continuous Elmarie Shiley, MD 500 mL/hr at 03/27/20 0126 New Bag at 03/27/20 0126  .  prismasol BGK 4/2.5 infusion   CRRT Continuous Elmarie Shiley, MD 200 mL/hr at 03/26/20 1130 New Bag at 03/26/20 1130  . 0.45 % sodium chloride infusion   Intravenous  Continuous PRN Wonda Olds, MD   Stopped at 03/22/20 2011  . 0.9 %  sodium chloride infusion (Manually program via Guardrails IV Fluids)   Intravenous Once Wilburn Cornelia, CRNA      . 0.9 %  sodium chloride infusion   Intravenous Continuous Lenn Cal, DDS   Stopped at 03/26/20 2158  . 0.9 %  sodium chloride infusion  250 mL Intravenous Continuous Atkins, Broadus Z, MD      . 0.9 %  sodium chloride infusion   Intravenous Continuous Wonda Olds, MD 20 mL/hr at 03/27/20 0500 Rate Verify at 03/27/20 0500  . 0.9 %  sodium chloride infusion   Intra-arterial PRN Prescott Gum, Collier Salina, MD      . acetaminophen (TYLENOL) tablet 650 mg  650 mg Per Tube Q4H PRN Wonda Olds, MD      . amiodarone (NEXTERONE PREMIX) 360-4.14 MG/200ML-% (1.8 mg/mL) IV infusion  30 mg/hr Intravenous Continuous Larey Dresser, MD 16.67 mL/hr at 03/27/20 1000 30 mg/hr at 03/27/20 1000  . aspirin chewable tablet  81 mg  81 mg Per Tube Daily Wonda Olds, MD   81 mg at 03/27/20 1014  . atorvastatin (LIPITOR) tablet 80 mg  80 mg Per Tube q1800 Wonda Olds, MD   80 mg at 03/26/20 1547  . B-complex with vitamin C tablet 1 tablet  1 tablet Per Tube Daily Wonda Olds, MD   1 tablet at 03/27/20 1014  . bisacodyl (DULCOLAX) EC tablet 10 mg  10 mg Oral Daily Wonda Olds, MD       Or  . bisacodyl (DULCOLAX) suppository 10 mg  10 mg Rectal Daily Atkins, Broadus Z, MD      . ceFEPIme (MAXIPIME) 2 g in sodium chloride 0.9 % 100 mL IVPB  2 g Intravenous Q12H Candee Furbish, MD 200 mL/hr at 03/27/20 1021 2 g at 03/27/20 1021  . chlorhexidine (PERIDEX) 0.12 % solution 15 mL  15 mL Mouth Rinse BID Wonda Olds, MD   15 mL at 03/27/20 1000  . Chlorhexidine Gluconate Cloth 2 % PADS 6 each  6 each Topical Daily Lenn Cal, DDS   6 each at 03/26/20 1030  . dexmedetomidine (PRECEDEX) 400 MCG/100ML (4 mcg/mL) infusion  0.4-1.2 mcg/kg/hr Intravenous Titrated Wonda Olds, MD   Stopped at  03/27/20 0804  . dextrose 50 % solution 0-50 mL  0-50 mL Intravenous PRN Atkins, Glenice Bow, MD      . docusate sodium (COLACE) capsule 200 mg  200 mg Oral Daily Wonda Olds, MD   200 mg at 03/24/20 0934  . feeding supplement (PRO-STAT SUGAR FREE 64) liquid 30 mL  30 mL Per Tube Daily Wonda Olds, MD   30 mL at 03/27/20 1014  . feeding supplement (VITAL 1.5 CAL) liquid 1,000 mL  1,000 mL Per Tube Continuous Wonda Olds, MD 40 mL/hr at 03/27/20 0700 Rate Verify at 03/27/20 0700  . fentaNYL (SUBLIMAZE) injection 25 mcg  25 mcg Intravenous Q1H PRN Wonda Olds, MD   25 mcg at 03/26/20 0918  . heparin injection 1,000-6,000 Units  1,000-6,000 Units CRRT PRN Wonda Olds, MD   2,800 Units at 03/27/20 0800  . heparin injection 1,000-6,000 Units  1,000-6,000 Units CRRT PRN Elmarie Shiley, MD      . hydrALAZINE (APRESOLINE) injection 20 mg  20 mg Intravenous Q4H PRN Wonda Olds, MD   20 mg at 03/26/20 1042  . HYDROcodone-acetaminophen (NORCO/VICODIN) 5-325 MG per tablet 1-2 tablet  1-2 tablet Per Tube Q6H PRN Atkins, Glenice Bow, MD      . influenza vaccine adjuvanted (FLUAD) injection 0.5 mL  0.5 mL Intramuscular Tomorrow-1000 Shelva Majestic A, MD      . insulin aspart (novoLOG) injection 0-24 Units  0-24 Units Subcutaneous Q4H Wonda Olds, MD   4 Units at 03/27/20 0749  . lactated ringers infusion 500 mL  500 mL Intravenous Once PRN Atkins, Glenice Bow, MD      . lactated ringers infusion   Intravenous Continuous Wonda Olds, MD 10 mL/hr at 03/27/20 1000 Rate Verify at 03/27/20 1000  . levalbuterol (XOPENEX) nebulizer solution 0.63 mg  0.63 mg Nebulization Q6H PRN Clegg, Amy D, NP      . LORazepam (ATIVAN) injection 0.5 mg  0.5 mg Intravenous Q6H PRN Wonda Olds, MD   0.5 mg at 03/26/20 2134  . MEDLINE mouth rinse  15 mL Mouth Rinse BID Larey Dresser, MD   15 mL at 03/27/20 1000  . MEDLINE  mouth rinse  15 mL Mouth Rinse q12n4p Wonda Olds, MD   15 mL at  03/26/20 1547  . milrinone (PRIMACOR) 20 MG/100 ML (0.2 mg/mL) infusion  0.375 mcg/kg/min Intravenous Continuous Wonda Olds, MD 11.59 mL/hr at 03/27/20 1046 0.375 mcg/kg/min at 03/27/20 1046  . norepinephrine (LEVOPHED) 4mg  in 249mL premix infusion  0-12 mcg/min Intravenous Titrated Wonda Olds, MD   Stopped at 03/21/20 1049  . ondansetron (ZOFRAN) injection 4 mg  4 mg Intravenous Q6H PRN Teena Dunk F, DDS   4 mg at 03/22/20 1211  . ondansetron (ZOFRAN) injection 4 mg  4 mg Intravenous Q6H PRN Wonda Olds, MD   4 mg at 03/23/20 1058  . oxyCODONE (Oxy IR/ROXICODONE) immediate release tablet 5-10 mg  5-10 mg Per Tube Q3H PRN Atkins, Glenice Bow, MD      . pantoprazole (PROTONIX) injection 40 mg  40 mg Intravenous Daily Wonda Olds, MD   40 mg at 03/27/20 1014  . phytonadione (VITAMIN K) 1 mg in dextrose 5 % 50 mL IVPB  1 mg Intravenous Once Larey Dresser, MD      . prismasol BGK 4/2.5 infusion   CRRT Continuous Elmarie Shiley, MD 1,500 mL/hr at 03/27/20 1032 New Bag at 03/27/20 1032  . sildenafil (REVATIO) tablet 20 mg  20 mg Per Tube TID Wonda Olds, MD   20 mg at 03/27/20 1014  . sodium chloride 0.9 % primer fluid for CRRT   CRRT PRN Elmarie Shiley, MD   Given at 03/25/20 1002  . sodium chloride flush (NS) 0.9 % injection 10-40 mL  10-40 mL Intracatheter Q12H Atkins, Glenice Bow, MD   10 mL at 03/27/20 1014  . sodium chloride flush (NS) 0.9 % injection 10-40 mL  10-40 mL Intracatheter PRN Atkins, Broadus Z, MD      . sodium chloride flush (NS) 0.9 % injection 3 mL  3 mL Intravenous Q12H Atkins, Glenice Bow, MD   10 mL at 03/27/20 0800  . sodium chloride flush (NS) 0.9 % injection 3 mL  3 mL Intravenous PRN Wonda Olds, MD   3 mL at 03/31/2020 2118  . sorbitol 70 % solution 15 mL  15 mL Oral Daily PRN Lajuana Matte, MD   15 mL at 03/26/20 1817  . traMADol (ULTRAM) tablet 50-100 mg  50-100 mg Per Tube Q4H PRN Wonda Olds, MD      . vancomycin (VANCOREADY)  IVPB 1250 mg/250 mL  1,250 mg Intravenous Q24H Candee Furbish, MD   Stopped at 03/26/20 1311  . Warfarin - Pharmacist Dosing Inpatient   Does not apply q1600 Wonda Olds, MD   Stopped at 03/24/20 1600      Allergies  Allergen Reactions  . Liraglutide Nausea And Vomiting  . Lisinopril Cough  :  Family History  Problem Relation Age of Onset  . Coronary artery disease Mother   . Colon cancer Mother   . Heart attack Mother   . Heart disease Mother   . Heart failure Mother   . Hypertension Mother   . Malignant hyperthermia Mother   . Colon cancer Sister   . Coronary artery disease Brother   . Heart disease Brother   :  Social History   Socioeconomic History  . Marital status: Married    Spouse name: Not on file  . Number of children: 8  . Years of education: Not on file  . Highest education level: Not on file  Occupational History  . Occupation: Community education officer    Comment: disabled  Tobacco Use  . Smoking status: Never Smoker  . Smokeless tobacco: Never Used  Substance and Sexual Activity  . Alcohol use: Yes    Alcohol/week: 0.0 standard drinks    Comment: 01/14/2015 "used to drink alot; stopped completely in 1983"  . Drug use: No    Comment: 01/14/2015 "used to use about anything I could get; stopped completely in 1983"  . Sexual activity: Not Currently  Other Topics Concern  . Not on file  Social History Narrative  . Not on file   Social Determinants of Health   Financial Resource Strain:   . Difficulty of Paying Living Expenses:   Food Insecurity:   . Worried About Charity fundraiser in the Last Year:   . Arboriculturist in the Last Year:   Transportation Needs:   . Film/video editor (Medical):   Marland Kitchen Lack of Transportation (Non-Medical):   Physical Activity:   . Days of Exercise per Week:   . Minutes of Exercise per Session:   Stress:   . Feeling of Stress :   Social Connections:   . Frequency of Communication with Friends and Family:   . Frequency  of Social Gatherings with Friends and Family:   . Attends Religious Services:   . Active Member of Clubs or Organizations:   . Attends Archivist Meetings:   Marland Kitchen Marital Status:   Intimate Partner Violence:   . Fear of Current or Ex-Partner:   . Emotionally Abused:   Marland Kitchen Physically Abused:   . Sexually Abused:   :  REVIEW OF SYSTEMS: Unable to obtain a comprehensive review of systems from the patient.  Exam: Patient Vitals for the past 24 hrs:  BP Temp Temp src Pulse Resp SpO2 Weight  03/27/20 1016 -- -- -- 99 (!) 24 97 % --  03/27/20 1000 -- -- -- 79 17 95 % --  03/27/20 0945 -- -- -- (!) 121 13 94 % --  03/27/20 0930 -- -- -- 97 14 95 % --  03/27/20 0915 -- -- -- -- 14 -- --  03/27/20 0900 (!) 128/111 -- -- 90 15 99 % --  03/27/20 0845 -- -- -- 90 (!) 22 98 % --  03/27/20 0830 -- -- -- (!) 44 15 96 % --  03/27/20 0815 -- -- -- 99 15 95 % --  03/27/20 0800 (!) 124/110 (!) 96 F (35.6 C) Axillary 92 14 91 % --  03/27/20 0741 -- -- -- 91 -- 94 % --  03/27/20 0700 103/79 -- -- 85 16 94 % --  03/27/20 0645 -- -- -- -- 12 -- --  03/27/20 0630 -- -- -- -- 13 -- --  03/27/20 0615 -- -- -- -- 11 -- --  03/27/20 0600 94/75 -- -- -- 12 -- --  03/27/20 0545 -- -- -- -- 12 -- --  03/27/20 0530 -- -- -- 83 13 92 % --  03/27/20 0515 -- -- -- 98 16 (!) 87 % --  03/27/20 0500 (!) 117/93 (!) 96.8 F (36 C) -- (!) 104 14 93 % 112.4 kg  03/27/20 0445 -- -- -- 91 12 94 % --  03/27/20 0430 -- -- -- (!) 101 18 95 % --  03/27/20 0415 -- -- -- 85 12 95 % --  03/27/20 0400 110/88 -- -- (!) 166 11 94 % --  03/27/20 0345 -- -- -- --  15 -- --  03/27/20 0330 -- -- -- (!) 115 11 93 % --  03/27/20 0315 -- -- -- (!) 139 12 93 % --  03/27/20 0300 -- -- -- 98 (!) 22 (!) 82 % --  03/27/20 0245 -- -- -- (!) 102 -- 95 % --  03/27/20 0230 -- -- -- (!) 131 17 94 % --  03/27/20 0215 -- -- -- 89 14 95 % --  03/27/20 0200 108/68 -- -- 85 (!) 23 95 % --  03/27/20 0145 -- -- -- 87 16 96 % --  03/27/20  0130 (!) 84/69 -- -- 89 (!) 22 96 % --  03/27/20 0115 -- -- -- -- (!) 9 -- --  03/27/20 0100 (!) 79/63 -- -- (!) 160 10 98 % --  03/27/20 0045 -- -- -- 74 11 98 % --  03/27/20 0030 -- -- -- (!) 150 11 98 % --  03/27/20 0015 -- -- -- (!) 158 10 96 % --  03/27/20 0000 (!) 74/58 -- -- (!) 139 10 98 % --  03/26/20 2345 -- -- -- (!) 174 12 95 % --  03/26/20 2330 -- -- -- (!) 145 11 97 % --  03/26/20 2315 -- -- -- 100 11 96 % --  03/26/20 2300 (!) 81/56 (!) 97.4 F (36.3 C) Axillary (!) 128 18 95 % --  03/26/20 2245 -- -- -- (!) 109 11 97 % --  03/26/20 2230 -- -- -- (!) 108 11 96 % --  03/26/20 2215 -- -- -- -- 11 -- --  03/26/20 2200 (!) 79/57 -- -- -- 12 -- --  03/26/20 2145 -- -- -- 79 11 95 % --  03/26/20 2130 -- -- -- 94 (!) 21 96 % --  03/26/20 2115 -- -- -- 97 16 95 % --  03/26/20 2100 -- -- -- (!) 104 18 95 % --  03/26/20 2045 -- -- -- (!) 105 (!) 23 96 % --  03/26/20 2030 -- -- -- 93 (!) 22 95 % --  03/26/20 2015 -- -- -- (!) 109 17 95 % --  03/26/20 2000 129/81 -- -- (!) 101 (!) 21 95 % --  03/26/20 1953 -- -- -- 97 (!) 31 98 % --  03/26/20 1945 -- -- -- 100 (!) 22 96 % --  03/26/20 1930 -- -- -- 99 19 95 % --  03/26/20 1915 -- -- -- (!) 117 16 94 % --  03/26/20 1900 (!) 115/91 (!) 97.5 F (36.4 C) Axillary 96 17 96 % --  03/26/20 1845 -- -- -- 94 (!) 22 95 % --  03/26/20 1830 -- -- -- 95 18 96 % --  03/26/20 1815 -- -- -- (!) 111 16 95 % --  03/26/20 1800 (!) 132/113 -- -- (!) 106 19 95 % --  03/26/20 1745 -- -- -- 96 (!) 22 95 % --  03/26/20 1730 -- -- -- (!) 105 14 95 % --  03/26/20 1715 -- -- -- (!) 104 19 95 % --  03/26/20 1700 120/82 -- -- (!) 101 15 95 % --  03/26/20 1645 -- -- -- (!) 107 13 95 % --  03/26/20 1630 -- -- -- 88 16 96 % --  03/26/20 1615 -- -- -- (!) 101 (!) 25 95 % --  03/26/20 1600 107/75 (!) 96.8 F (36 C) Axillary (!) 103 20 96 % --  03/26/20 1545 -- -- -- 89 14 95 % --  03/26/20 1530 -- -- -- (!) 104 10 96 % --  03/26/20 1515 -- -- -- 91 13  94 % --  03/26/20 1500 111/86 -- -- (!) 109 (!) 24 93 % --  03/26/20 1445 -- -- -- (!) 112 19 94 % --  03/26/20 1430 -- -- -- 94 16 94 % --  03/26/20 1415 -- -- -- 87 17 94 % --  03/26/20 1403 -- -- -- -- -- 94 % --  03/26/20 1400 (!) 125/105 -- -- (!) 106 16 94 % --  03/26/20 1345 -- -- -- (!) 110 20 92 % --  03/26/20 1330 -- -- -- (!) 107 19 91 % --  03/26/20 1315 -- -- -- (!) 109 (!) 26 92 % --  03/26/20 1300 (!) 110/91 -- -- (!) 105 (!) 31 92 % --  03/26/20 1245 -- -- -- (!) 106 (!) 21 91 % --  03/26/20 1230 -- -- -- (!) 105 (!) 21 93 % --  03/26/20 1215 -- -- -- (!) 109 (!) 24 90 % --  03/26/20 1200 103/80 (!) 97.5 F (36.4 C) Axillary (!) 111 (!) 22 91 % --  03/26/20 1145 -- -- -- (!) 108 (!) 21 91 % --    General: The patient is awake and alert, does not open eyes when speaking with me.   Eyes:  no scleral icterus.   ENT: No thrush or mucositis.   Lymphatics:  Negative cervical, supraclavicular or axillary adenopathy.  Respiratory: lungs were clear bilaterally without wheezing or crackles.  Currently on nonrebreather.   Cardiovascular: Mechanical heart sounds, no lower extremity edema GI:  abdomen was soft, flat, nontender, nondistended, without organomegaly.   Skin: No petechiae.  Scattered ecchymoses on the upper extremities.  No bleeding surrounding HD catheter.  Dressing LVAD site clean dry and intact. Neuro exam was nonfocal.  The patient is alert and oriented x3.  LABS:  Lab Results  Component Value Date   WBC 10.4 03/27/2020   HGB 9.2 (L) 03/27/2020   HCT 27.0 (L) 03/27/2020   PLT 139 (L) 03/27/2020   GLUCOSE 129 (H) 03/27/2020   CHOL 89 03/24/2020   TRIG 69 03/30/2020   HDL 33 (L) 03/28/2020   LDLCALC 42 03/13/2020   ALT 25 03/27/2020   AST 38 03/27/2020   NA 138 03/27/2020   K 3.5 03/27/2020   CL 103 03/27/2020   CREATININE 2.44 (H) 03/27/2020   BUN 42 (H) 03/27/2020   CO2 24 03/27/2020   INR 6.2 (HH) 03/27/2020   HGBA1C 10.5 (H) 03/25/2020    CT  ABDOMEN PELVIS WO CONTRAST  Result Date: 03/27/2020 CLINICAL DATA:  69 year old male with family history of colon cancer. Screening. EXAM: CT ABDOMEN AND PELVIS WITHOUT CONTRAST TECHNIQUE: Multidetector CT imaging of the abdomen and pelvis was performed following the standard protocol without IV contrast. COMPARISON:  None. FINDINGS: Evaluation of this exam is limited in the absence of intravenous contrast. Lower chest: Probable trace left pleural effusion versus pleural thickening. There are bibasilar atelectasis/scarring. Cardiomegaly. Multi vessel coronary vascular calcification and cardiac pacemaker wires. No intra-abdominal free air. Small perihepatic ascites. Hepatobiliary: Morphologic changes of early cirrhosis. No intrahepatic biliary ductal dilatation. There are multiple stones within the gallbladder. No pericholecystic fluid or evidence of acute cholecystitis by CT. Pancreas: Mild pancreatic atrophy. No active inflammation. Spleen: Normal in size without focal abnormality. Adrenals/Urinary Tract: The adrenal glands are unremarkable. There is no hydronephrosis or nephrolithiasis. The visualized ureters and urinary bladder appear  unremarkable. Stomach/Bowel: Small hiatal hernia. There is no bowel obstruction or active inflammation. The appendix is unremarkable. Vascular/Lymphatic: Advanced aortoiliac atherosclerotic disease. The IVC is grossly unremarkable on this noncontrast CT. No portal venous gas. There is no adenopathy. Reproductive: The prostate and seminal vesicles are grossly unremarkable. Other: Small fat containing bilateral inguinal hernias. Compression noted over the right groin. Musculoskeletal: Osteopenia with degenerative changes of the spine. No acute osseous pathology. IMPRESSION: 1. No acute intra-abdominal or pelvic pathology. No bowel obstruction. Normal appendix. 2. Cirrhosis with small perihepatic ascites. 3. Cholelithiasis. No evidence of acute cholecystitis by CT. 4. Aortic  Atherosclerosis (ICD10-I70.0). Electronically Signed   By: Anner Crete M.D.   On: 03/25/2020 17:16   DG Orthopantogram  Result Date: 03/15/2020 CLINICAL DATA:  Evaluate for possible periapical abscess EXAM: ORTHOPANTOGRAM/PANORAMIC COMPARISON:  None. FINDINGS: Panoramic view of the mandible demonstrates no acute fracture or dislocation. The patient is almost edentulous with only 4 residual teeth in the mandible on the left. No periapical abscess is noted. No significant dental caries are seen. No other focal abnormality is noted. IMPRESSION: No definitive abscess is noted. Electronically Signed   By: Inez Catalina M.D.   On: 03/23/2020 14:25   DG Chest 1 View  Result Date: 03/24/2020 CLINICAL DATA:  Chest tube, LVAD EXAM: CHEST  1 VIEW COMPARISON:  03/23/2020 FINDINGS: Support devices are stable. Cardiomegaly. Bilateral airspace disease slightly worsened since prior study. This is greater on the right. No effusions or pneumothorax. No acute bony abnormality. IMPRESSION: Worsening bilateral airspace disease, right greater than left could reflect asymmetric edema or infection. Electronically Signed   By: Rolm Baptise M.D.   On: 03/24/2020 08:02   DG Chest 1 View  Result Date: 03/22/2020 CLINICAL DATA:  Open heart surgery. EXAM: CHEST  1 VIEW COMPARISON:  Radiograph 03/21/2020 FINDINGS: LEFT-sided pacemaker and LVAD device noted. Swan-Ganz catheter with tip in the main pulmonary artery. RIGHT chest tube in place without pneumothorax. Interval extubation Cardiomegaly with mild central venous congestion. No overt pulmonary edema. Probable LEFT effusion. No pneumothorax. IMPRESSION: 1. Interval extubation without complication. 2. Stable additional support apparatus. 3. Cardiomegaly and central venous congestion with LEFT basilar atelectasis and small effusion. Electronically Signed   By: Suzy Bouchard M.D.   On: 03/22/2020 08:39   CT HEAD WO CONTRAST  Result Date: 03/27/2020 CLINICAL DATA:  Post  LVAD.  Ataxia, agitated EXAM: CT HEAD WITHOUT CONTRAST TECHNIQUE: Contiguous axial images were obtained from the base of the skull through the vertex without intravenous contrast. COMPARISON:  01/04/2020 FINDINGS: Brain: Large area of infarct in the posterior temporal and parietal lobes on the right, new since prior study. No hemorrhage. No hydrocephalus. Vascular: No hyperdense vessel or unexpected calcification. Skull: No acute calvarial abnormality. Sinuses/Orbits: Visualized paranasal sinuses and mastoids clear. Orbital soft tissues unremarkable. Other: None IMPRESSION: Large posterior right MCA infarct, new since prior study. Electronically Signed   By: Rolm Baptise M.D.   On: 03/27/2020 09:08   CT CHEST WO CONTRAST  Result Date: 03/09/2020 CLINICAL DATA:  Preop CABG. EXAM: CT CHEST WITHOUT CONTRAST TECHNIQUE: Multidetector CT imaging of the chest was performed following the standard protocol without IV contrast. COMPARISON:  March 03, 2018 FINDINGS: Cardiovascular: The ascending thoracic aorta measures approximately 4.7 cm in diameter. Atherosclerotic changes are noted throughout the thoracic aorta. Coronary artery disease is noted. There is no significant pericardial effusion. There is cardiomegaly. There is a dual chamber left-sided pacemaker in place. The patient is status post prior median sternotomy.  Mediastinum/Nodes: --No mediastinal or hilar lymphadenopathy. --No axillary lymphadenopathy. --No supraclavicular lymphadenopathy. --Normal thyroid gland. --The esophagus is unremarkable Lungs/Pleura: There is scattered bilateral calcified granulomas. There is a trace to small left-sided pleural effusion. There is some mild bronchial wall thickening at the lung bases. There is no pneumothorax or large focal infiltrate. Upper Abdomen: There is cholelithiasis without secondary signs of acute cholecystitis. Musculoskeletal: No chest wall abnormality. No acute or significant osseous findings. There is  bilateral gynecomastia. IMPRESSION: 1. Cardiomegaly with coronary artery disease. The patient is status post prior median sternotomy. 2. Dual chamber left-sided pacemaker in place that appears well positioned. 3. Ascending thoracic aorta measures approximately 4.7 cm in diameter. Ascending thoracic aortic aneurysm. Recommend semi-annual imaging followup by CTA or MRA and referral to cardiothoracic surgery if not already obtained. This recommendation follows 2010 ACCF/AHA/AATS/ACR/ASA/SCA/SCAI/SIR/STS/SVM Guidelines for the Diagnosis and Management of Patients With Thoracic Aortic Disease. Circulation. 2010; 121: P950-D326. Aortic aneurysm NOS (ICD10-I71.9) 4. Cholelithiasis. 5. Bilateral gynecomastia. 6. Trace to small left-sided pleural effusion. 7. Aortic Atherosclerosis (ICD10-I70.0). Electronically Signed   By: Constance Holster M.D.   On: 02/14/2020 21:03   CARDIAC CATHETERIZATION  Result Date: 03/13/2020  Dist RCA lesion is 50% stenosed.  3rd RPL lesion is 100% stenosed.  RPDA lesion is 99% stenosed.  Prox RCA lesion is 20% stenosed.  RPAV lesion is 60% stenosed.  3rd Mrg-2 lesion is 100% stenosed.  3rd Mrg-1 lesion is 100% stenosed.  Ost Cx to Mid Cx lesion is 40% stenosed.  1st Diag-1 lesion is 10% stenosed.  1st Diag-2 lesion is 90% stenosed.  2nd Diag lesion is 99% stenosed.  Prox LAD to Mid LAD lesion is 70% stenosed.  Mid LAD to Dist LAD lesion is 60% stenosed.  1. Severe triple vessel CAD 2. The LAD wraps the apex. The LAD has a patent mid stented segment with moderate restenosis. The remainder of the mid and distal LAD has diffuse moderate to severe stenosis. There are two moderate caliber diagonal branches that arise from the mid LAD. The first diagonal branch has a patent proximal stent and then severe mid stenosis. The second diagonal branch has diffuse severe stenosis. 3. The Circumflex has moderate proximal stenosis. The large obtuse marginal that has been stented is now occluded  and fills from left to right collaterals. 4. The RCA is a large dominant vessel with a large PDA and moderate caliber posterolateral artery. The PDA has a severe ostial stenosis. The posterolateral artery has severe proximal stenosis and total mid occlusion. 5. Elevated wedge pressure. 6. Cardiac output 3.88, CI 1.71 Recommendations: He will be admitted to telemetry. We will need to get more information prior to planning for revascularization. We will need to optimize his fluid status. I will arrange an echo today to evaluate his valves and LVEF. He is volume overloaded and will be started on IV diuretics. I will ask CT surgery to see him to review CABG as a revascularization option. He has diffuse multi-vessel CAD with sub-optimal targets. His vessels are not favorable for PCI. The PDA would be a target for stenting.  Continue statin and beta blocker. Will start ASA. His oral anticoagulation is on hold. Will resume IV heparin 6 hours post sheath removal.   US RENAL  Result Date: 03/31/2020 CLINICAL DATA:  Acute kidney injury EXAM: RENAL / URINARY TRACT ULTRASOUND COMPLETE COMPARISON:  None. FINDINGS: Right Kidney: Renal measurements: 11.2 x 4.6 x 5.6 cm = volume: 151 mL. Increased cortical echogenicity. No mass or hydronephrosis  visualized. Left Kidney: Renal measurements: 10.7 x 6.1 x 6.1 cm = volume: 208 mL. Increased cortical echogenicity. No mass or hydronephrosis visualized. Bladder: Appears normal for degree of bladder distention. Other: None. IMPRESSION: Normal size of the kidneys. No hydronephrosis. Increased cortical echogenicity, in keeping with medical renal disease. Electronically Signed   By: Eddie Candle M.D.   On: 03/23/2020 11:10   DG Chest Port 1 View  Result Date: 03/27/2020 CLINICAL DATA:  Chest tube.  Left ventricular assist device. EXAM: PORTABLE CHEST 1 VIEW COMPARISON:  03/26/2020 FINDINGS: Interim placement of feeding tube, its tip is below left hemidiaphragm. Interim removal  Swan-Ganz catheter and mediastinal drainage catheters. Right IJ sheath in stable position. Right subclavian line stable position. Left chest tube in stable position. Cardiac pacer left ventricular assist device in stable position. Left atrial appendage clip again noted. Stable cardiomegaly. Unchanged bilateral interstitial prominence suggesting interstitial edema. Pneumonitis cannot be excluded. Tiny left pleural effusion cannot be excluded. No pneumothorax. IMPRESSION: 1. Interim placement of feeding tube, its tip is below left hemidiaphragm. Interim removal Swan-Ganz catheter and mediastinal drainage catheters. Right IJ sheath in stable position. Right subclavian line in stable position. Left chest tube in stable position. No pneumothorax. 2. Cardiac pacer left ventricular assist device in stable position. Stable cardiomegaly. 3. Unchanged bilateral interstitial prominence suggesting interstitial edema. Tiny left pleural effusion cannot be excluded. Electronically Signed   By: Marcello Moores  Register   On: 03/27/2020 07:26   DG Chest Port 1 View  Result Date: 03/26/2020 CLINICAL DATA:  Status post cardiac surgery. EXAM: PORTABLE CHEST 1 VIEW COMPARISON:  March 25, 2020. FINDINGS: Stable cardiomegaly. Left-sided pacemaker is unchanged in position. Stable position of left ventricular assist device. Right subclavian catheter is unchanged in position. Right internal jugular Swan-Ganz catheter is noted with tip in expected position of main pulmonary artery. Left-sided chest tube is noted without pneumothorax. Mild bilateral lung opacities are noted concerning for edema. Probable small left pleural effusion and left basilar atelectasis is noted. Bony thorax is unremarkable. IMPRESSION: Stable support apparatus. No pneumothorax is noted. Mild bilateral lung opacities are noted concerning for edema. Probable small left pleural effusion and left basilar atelectasis is noted. Electronically Signed   By: Marijo Conception M.D.    On: 03/26/2020 09:47   DG CHEST PORT 1 VIEW  Result Date: 03/25/2020 CLINICAL DATA:  Post hemodialysis catheter insertion. EXAM: PORTABLE CHEST 1 VIEW COMPARISON:  03/25/2020 FINDINGS: Left-sided pacemaker and left ventricular assist device unchanged. Right IJ Swan-Ganz catheter with tip over the main pulmonary artery segment unchanged. Interval placement of right subclavian central venous catheter with tip at the cavoatrial junction. Catheter from below with tip just right of the Swan-Ganz catheter tip in the main pulmonary artery segment unchanged. Lungs are adequately inflated demonstrate persistent hazy bilateral perihilar opacification with interval improvement. Persistent bibasilar opacification without significant change. Pneumothorax. Stable cardiomegaly. Remainder of the exam is unchanged. IMPRESSION: 1. Stable cardiomegaly with interval improvement and hazy bilateral perihilar opacification. Stable bibasilar airspace density. 2. Tubes and lines as described. New right subclavian central venous catheter with tip over the SVC. No pneumothorax. Electronically Signed   By: Marin Olp M.D.   On: 03/25/2020 09:20   DG Chest Port 1 View  Result Date: 03/25/2020 CLINICAL DATA:  Confusion. Left ventricular assist device present. Areas of airspace consolidation EXAM: PORTABLE CHEST 1 VIEW COMPARISON:  March 24, 2020 FINDINGS: Central catheter tip is in the main pulmonary outflow tract. Pacemaker leads are attached to the  right atrium and right ventricle. There is incomplete visualization of a left ventricular assist device. No pneumothorax. There is widespread airspace opacity throughout the right lung, with most pronounced consolidation in the right base. There is ill-defined airspace opacity in the left base as well, stable. There is cardiomegaly with pulmonary vascularity within normal limits. There is aortic atherosclerosis. No adenopathy. No appreciable bone lesions. IMPRESSION: Multifocal airspace  opacity, considerably more on the right than on the left. Suspect multifocal pneumonia, although a degree of pulmonary edema superimposed is quite possible. Both edema and pulmonary edema may be present concurrently. Stable cardiac prominence. Postoperative changes. Aortic Atherosclerosis (ICD10-I70.0). Electronically Signed   By: Lowella Grip III M.D.   On: 03/25/2020 08:57   DG Chest Port 1 View  Result Date: 03/23/2020 CLINICAL DATA:  Post open heart surgery. EXAM: PORTABLE CHEST 1 VIEW COMPARISON:  03/22/2020 FINDINGS: Left ventricular assist device and left-sided cardiac pacemaker unchanged. Right IJ Swan-Ganz catheter with tip over the main pulmonary artery segment unchanged. Catheter from below courses over the right heart with projecting in the region of the main pulmonary artery segment unchanged. Right-sided chest tube unchanged. Lungs are adequately inflated demonstrate mild worsening opacification over the right base which may be due to atelectasis or infection. Slight worsening opacification in the left retrocardiac region which may be due to effusion/atelectasis. Mild stable prominence of the perihilar vessels suggesting mild vascular congestion. Stable cardiomegaly. Remainder the exam is unchanged. IMPRESSION: 1. Slight worsening hazy opacification in the left base/retrocardiac region as well as right base. Findings may be due to atelectasis/effusion versus infection. 2.  Support apparatus as described. Electronically Signed   By: Marin Olp M.D.   On: 03/23/2020 08:23   DG Chest Port 1 View  Result Date: 03/21/2020 CLINICAL DATA:  Status post left ventricular assist device. EXAM: PORTABLE CHEST 1 VIEW COMPARISON:  03/31/2020 FINDINGS: The endotracheal tube, NG tube, right chest tube and Swan-Ganz catheters are stable. The pacer wires are stable. Left ventricular assist device in good position without complicating features. Streaky left basilar atelectasis but no edema, infiltrates or  large effusions. IMPRESSION: 1. Stable support apparatus. 2. Streaky left basilar atelectasis. Electronically Signed   By: Marijo Sanes M.D.   On: 03/21/2020 08:25   DG CHEST PORT 1 VIEW  Result Date: 03/28/2020 CLINICAL DATA:  Left ventricular assist device placement EXAM: PORTABLE CHEST 1 VIEW COMPARISON:  03/15/2020 FINDINGS: Endotracheal tube tip 5 cm above the carina. Orogastric or nasogastric tube enters the abdomen. Swan-Ganz catheter inserted from a right internal jugular approach has its tip in the main pulmonary artery. Pacemaker/AICD remains in place. Left ventricular assist device now present. No sign of pneumothorax or pleural fluid. Atrial clip in place. The lungs are clear except for a volume loss in the left lower lobe. No pulmonary edema. IMPRESSION: Left ventricular assist device placement. No evidence of procedural complication. Left lower lobe volume loss. Lines and tubes well position radiographically. Electronically Signed   By: Nelson Chimes M.D.   On: 03/30/2020 15:05   DG CHEST PORT 1 VIEW  Result Date: 03/15/2020 CLINICAL DATA:  Line placement EXAM: PORTABLE CHEST 1 VIEW COMPARISON:  X-ray from same day FINDINGS: There is been interval placement of a Swan-Ganz catheter with the tip terminating over the right lower lobe pulmonary artery. The heart size remains enlarged. The patient is status post prior median sternotomy. There is a dual chamber left-sided AICD in place. There is vascular congestion without overt pulmonary edema. IMPRESSION: Swan-Ganz  catheter projects over the right lower lobe pulmonary artery. There is no pneumothorax. Otherwise, no significant short interval change. Electronically Signed   By: Constance Holster M.D.   On: 03/15/2020 20:16   DG CHEST PORT 1 VIEW  Result Date: 03/15/2020 CLINICAL DATA:  Line placement. EXAM: PORTABLE CHEST 1 VIEW COMPARISON:  March 11, 2020 FINDINGS: There is been interval placement of a Swan-Ganz catheter with the tip terminating  over a segmental branch of the right lower lobe pulmonary artery. The heart size remains enlarged. The patient is status post prior median sternotomy. There is a dual chamber left-sided AICD in place. There is vascular congestion without overt pulmonary edema. IMPRESSION: 1. Swan-Ganz catheter as detailed above. No evidence for pneumothorax. 2. Stable cardiomegaly with mild vascular congestion. Electronically Signed   By: Constance Holster M.D.   On: 03/15/2020 20:15   DG Abd Portable 1V  Result Date: 03/26/2020 CLINICAL DATA:  Feeding tube. EXAM: PORTABLE ABDOMEN - 1 VIEW COMPARISON:  None. FINDINGS: Distal tip of feeding tube appears to be in proximal stomach. Visualized bowel loops are unremarkable. IMPRESSION: Distal tip of feeding tube appears to be in proximal stomach. Electronically Signed   By: Marijo Conception M.D.   On: 03/26/2020 11:49   ECHOCARDIOGRAM COMPLETE  Result Date: 03/13/2020    ECHOCARDIOGRAM REPORT   Patient Name:   [name] [name] [facility] Date of Exam: 03/13/2020 Medical Rec #:  [id number]           Height:       72.0 in Accession #:    [id number]          Weight:       244.9 lb Date of Birth:  17-Feb-1951           BSA:          2.322 m Patient Age:    21 years            BP:           142/99 mmHg Patient Gender: M                   HR:           82 bpm. Exam Location:  Inpatient Procedure: 2D Echo, Cardiac Doppler and Color Doppler Indications:    CAD  History:        Patient has prior history of Echocardiogram examinations, most                 recent 01/06/2020. Cardiomyopathy and CHF, CAD, Defibrillator,                 Obesity, Mitral Valve Disease, Arrythmias:Atrial Fibrillation;                 Risk Factors:Hypertension, Dyslipidemia and Diabetes. CKD.  Sonographer:    Dustin Flock Referring Phys: [place]  1. Global hypokinesis with akinesis of the inferolateral wall; overall severe LV dysfunction; elevated LV filling pressure; mild LVH; mild LVE;  severe MR; biatrial enlargement; mild RV dysfunction; mild TR with moderate pulmonary hypertension.  2. Left ventricular ejection fraction, by estimation, is <20%. The left ventricle has severely decreased function. The left ventricle has no regional wall motion abnormalities. The left ventricular internal cavity size was mildly dilated. There is mild left ventricular hypertrophy. Left ventricular diastolic function could not be evaluated. Elevated left atrial pressure.  3. Right ventricular systolic function is mildly reduced. The right ventricular size is normal.  There is moderately elevated pulmonary artery systolic pressure.  4. Left atrial size was severely dilated.  5. Right atrial size was moderately dilated.  6. The mitral valve is normal in structure. Severe mitral valve regurgitation. No evidence of mitral stenosis.  7. The aortic valve is tricuspid. Aortic valve regurgitation is not visualized. No aortic stenosis is present.  8. The inferior vena cava is dilated in size with <50% respiratory variability, suggesting right atrial pressure of 15 mmHg. FINDINGS  Left Ventricle: Left ventricular ejection fraction, by estimation, is <20%. The left ventricle has severely decreased function. The left ventricle has no regional wall motion abnormalities. The left ventricular internal cavity size was mildly dilated. There is mild left ventricular hypertrophy. Left ventricular diastolic function could not be evaluated due to atrial fibrillation. Left ventricular diastolic function could not be evaluated. Elevated left atrial pressure. Right Ventricle: The right ventricular size is normal.Right ventricular systolic function is mildly reduced. There is moderately elevated pulmonary artery systolic pressure. The tricuspid regurgitant velocity is 2.87 m/s, and with an assumed right atrial  pressure of 15 mmHg, the estimated right ventricular systolic pressure is 35.4 mmHg. Left Atrium: Left atrial size was severely  dilated. Right Atrium: Right atrial size was moderately dilated. Pericardium: There is no evidence of pericardial effusion. Mitral Valve: The mitral valve is normal in structure. Normal mobility of the mitral valve leaflets. Moderate mitral annular calcification. Severe mitral valve regurgitation. No evidence of mitral valve stenosis. MV peak gradient, 10.8 mmHg. The mean mitral valve gradient is 3.5 mmHg. Tricuspid Valve: The tricuspid valve is normal in structure. Tricuspid valve regurgitation is mild . No evidence of tricuspid stenosis. Aortic Valve: The aortic valve is tricuspid. Aortic valve regurgitation is not visualized. No aortic stenosis is present. Pulmonic Valve: The pulmonic valve was normal in structure. Pulmonic valve regurgitation is trivial. No evidence of pulmonic stenosis. Aorta: The aortic root is normal in size and structure. Venous: The inferior vena cava is dilated in size with less than 50% respiratory variability, suggesting right atrial pressure of 15 mmHg.  Additional Comments: Global hypokinesis with akinesis of the inferolateral wall; overall severe LV dysfunction; elevated LV filling pressure; mild LVH; mild LVE; severe MR; biatrial enlargement; mild RV dysfunction; mild TR with moderate pulmonary hypertension. A pacer wire is visualized.  LEFT VENTRICLE PLAX 2D LVIDd:         5.80 cm  Diastology LVIDs:         5.30 cm  LV e' lateral:   5.48 cm/s LV PW:         1.40 cm  LV E/e' lateral: 27.0 LV IVS:        1.40 cm  LV e' medial:    3.60 cm/s LVOT diam:     2.30 cm  LV E/e' medial:  41.1 LV SV:         51 LV SV Index:   22 LVOT Area:     4.15 cm  RIGHT VENTRICLE RV Basal diam:  3.40 cm RV S prime:     8.40 cm/s TAPSE (M-mode): 3.0 cm LEFT ATRIUM              Index       RIGHT ATRIUM           Index LA diam:        5.30 cm  2.28 cm/m  RA Area:     30.10 cm LA Vol (A2C):   124.0 ml 53.40 ml/m RA Volume:  108.00 ml 46.51 ml/m LA Vol (A4C):   167.0 ml 71.91 ml/m LA Biplane Vol: 152.0  ml 65.46 ml/m  AORTIC VALVE LVOT Vmax:   68.00 cm/s LVOT Vmean:  42.100 cm/s LVOT VTI:    0.122 m  AORTA Ao Root diam: 2.90 cm MITRAL VALVE                TRICUSPID VALVE MV Area (PHT): 3.53 cm     TR Peak grad:   32.9 mmHg MV Peak grad:  10.8 mmHg    TR Vmax:        287.00 cm/s MV Mean grad:  3.5 mmHg MV Vmax:       1.64 m/s     SHUNTS MV Vmean:      77.6 cm/s    Systemic VTI:  0.12 m MV Decel Time: 215 msec     Systemic Diam: 2.30 cm MV E velocity: 148.00 cm/s Kirk Ruths MD Electronically signed by Kirk Ruths MD Signature Date/Time: 03/13/2020/1:12:00 PM    Final    ECHO INTRAOPERATIVE TEE  Result Date: 04/06/2020  *INTRAOPERATIVE TRANSESOPHAGEAL REPORT *  Patient Name:   [name] [name] [facility] Date of Exam: 03/22/2020 Medical Rec #:  [id number]           Height:       72.0 in Accession #:    [id number]          Weight:       227.1 lb Date of Birth:  21-Jun-1951           BSA:          2.25 m Patient Age:    25 years            BP:           133/87 mmHg Patient Gender: M                   HR:           80 bpm. Exam Location:  Anesthesiology Transesophogeal exam was perform intraoperatively during surgical procedure. Patient was closely monitored under general anesthesia during the entirety of examination. Indications:     CAD Native Vessel i25.10, LVAD placement Performing Phys: [id number] JHERDEY Z ATKINS Diagnosing Phys: Roberts Gaudy MD PRE-OP FINDINGS  Left Ventricle: There is abnormal (paradoxical) septal motion, consistent with left bundle branch block. The LV cavity was markedly enlarged and measured 6.4 cm at end-diastole at the mid-papillary level in the trans-gastric short-axis view. The ejection fraction was calculated to be 20-25% using the 2DQ border recognition software. There was global LV hypokinesis. LV wall thickness appeared normal without obvious thinned or aneursmal segments. On the post-bypass exam, the LV inflow cannula was well positioned and oriented towards the mitral valve. The cannula  was not abutting the inter-ventricular septum and there was laminar inflow of blood. Right Ventricle: On the pre-bypass exam, the RV was normal in size and there was mildly reduced RV systolic function. On the post-bypass exam, there was mildly reduced RV systolic function. There was mild septal flattening noted intermittently, but the septum remained in a left to right convex orientation for the vast majority of the time. Left Atrium: The LAA cavity was dilated and measured 4.9 cm in the medial-lateral dimension. There was no thrombus noted with the LAA cavity or LAA appendage.  Right Atrium: The RA cavity was moderately enlarged with an AICD lead seen entering the RA from the SVC and passing the tricuspid valve. Interatrial  Septum: There is no evidence of a patent foramen ovale. There was no evidence of patent foramen oval or atrial septal defect by color Doppler and bubble study with valsalva and agitated albumin. On the post-bypass exam, there was no evidence of patent foramen ovale by color Doppler. Mitral Valve: No evidence of mitral valve stenosis. There was an anunuloplasty ring in the mitral position. The anterior leaflet was freely mobile and the posterior leaflet was restricted in motion. There was moderate mitral regurgitation with two jets seen on color Doppler. The MR jets involved the P2/A2 and anterior-lateral commissure and were within the annuloplasty ring. The mean trans-mitral gradient was 2-3 mm hg by CW Doppler. Tricuspid Valve: The tricuspid annulus was normal in diameter and measured 3.3 cm in the four-chamber view and 3.3 cm in the RV inflow-outflow view. There was an AICD lead traversing the tricuspid valve. On the post-bypass exam, there was mild tricuspid regurgitation. Aortic Valve: The aortic valve was tri-leaflet. The leaflets were mildly thickened and opened normallly. There was no aortic insufficiency seen on color Doppler. Aorta: There was a well defined aortic root and sino-tubular  junction without dilatation or effacement. There was mild intimal thickening but no protruding atheromatous disease present.  Roberts Gaudy MD Electronically signed by Roberts Gaudy MD Signature Date/Time: 03/29/2020/8:31:18 PM    Final    VAS US DOPPLER PRE CABG  Result Date: 03/13/2020 PREOPERATIVE VASCULAR EVALUATION  Indications:  Pre-CABG. Risk Factors: Hypertension, hyperlipidemia, Diabetes. Performing Technologist: June Leap RDMS, RVT  Examination Guidelines: A complete evaluation includes B-mode imaging, spectral Doppler, color Doppler, and power Doppler as needed of all accessible portions of each vessel. Bilateral testing is considered an integral part of a complete examination. Limited examinations for reoccurring indications may be performed as noted.  Right Carotid Findings: +----------+--------+--------+--------+------------+--------+           PSV cm/sEDV cm/sStenosisDescribe    Comments +----------+--------+--------+--------+------------+--------+ CCA Prox  63      12                                   +----------+--------+--------+--------+------------+--------+ CCA Distal38      11                                   +----------+--------+--------+--------+------------+--------+ ICA Prox  60      22      1-39%   heterogenous         +----------+--------+--------+--------+------------+--------+ ICA Distal71      31                                   +----------+--------+--------+--------+------------+--------+ ECA       46      12                                   +----------+--------+--------+--------+------------+--------+ Portions of this table do not appear on this page. +----------+--------+-------+----------------+------------+           PSV cm/sEDV cmsDescribe        Arm Pressure +----------+--------+-------+----------------+------------+ Subclavian64             Multiphasic, WNL              +----------+--------+-------+----------------+------------+ +---------+--------+--+--------+-+---------+  VertebralPSV cm/s17EDV cm/s7Antegrade +---------+--------+--+--------+-+---------+ Left Carotid Findings: +----------+--------+--------+--------+------------+--------+           PSV cm/sEDV cm/sStenosisDescribe    Comments +----------+--------+--------+--------+------------+--------+ CCA Prox  46      12                                   +----------+--------+--------+--------+------------+--------+ CCA Distal46      13                                   +----------+--------+--------+--------+------------+--------+ ICA Prox  58      26      1-39%   heterogenous         +----------+--------+--------+--------+------------+--------+ ICA Distal54      22                                   +----------+--------+--------+--------+------------+--------+ ECA       65      13                                   +----------+--------+--------+--------+------------+--------+ +----------+--------+--------+----------------+------------+ SubclavianPSV cm/sEDV cm/sDescribe        Arm Pressure +----------+--------+--------+----------------+------------+           47              Multiphasic, [id number]          +----------+--------+--------+----------------+------------+ +---------+--------+--+--------+--+---------+ VertebralPSV cm/s40EDV cm/s15Antegrade +---------+--------+--+--------+--+---------+  ABI Findings: +--------+------------------+-----+---------+----------------------------------+ Right   Rt Pressure (mmHg)IndexWaveform Comment                            +--------+------------------+-----+---------+----------------------------------+ Brachial                       triphasicnot obtained due to restricted arm +--------+------------------+-----+---------+----------------------------------+ ATA     139               1.20 biphasic                                     +--------+------------------+-----+---------+----------------------------------+ PTA     150               1.29 biphasic                                    +--------+------------------+-----+---------+----------------------------------+ +--------+------------------+-----+---------+-------+ Left    Lt Pressure (mmHg)IndexWaveform Comment +--------+------------------+-----+---------+-------+ [id number]                    triphasic        +--------+------------------+-----+---------+-------+ ATA     136               1.17 biphasic         +--------+------------------+-----+---------+-------+ PTA     139               1.20 biphasic         +--------+------------------+-----+---------+-------+  Right Doppler Findings: +--------+--------+-----+---------+----------------------------------+ Site    PressureIndexDoppler  Comments                           +--------+--------+-----+---------+----------------------------------+  Brachial             triphasicnot obtained due to restricted arm +--------+--------+-----+---------+----------------------------------+ Radial               triphasic                                   +--------+--------+-----+---------+----------------------------------+ Ulnar                triphasic                                   +--------+--------+-----+---------+----------------------------------+  Left Doppler Findings: +--------+--------+-----+---------+--------+ Site    PressureIndexDoppler  Comments +--------+--------+-----+---------+--------+ [id number]          triphasic         +--------+--------+-----+---------+--------+ Radial               triphasic         +--------+--------+-----+---------+--------+ Ulnar                triphasic         +--------+--------+-----+---------+--------+  Summary: Right Carotid: Velocities in the right ICA are consistent with a 1-39% stenosis. Left Carotid:  Velocities in the left ICA are consistent with a 1-39% stenosis. Right ABI: Resting right ankle-brachial index is within normal range. No evidence of significant right lower extremity arterial disease. Left ABI: Resting left ankle-brachial index is within normal range. No evidence of significant left lower extremity arterial disease. Right Upper Extremity: Not obtained due to restricted arm. Left Upper Extremity: Doppler waveforms remain within normal limits with left radial compression. Doppler waveforms remain within normal limits with left ulnar compression.  Electronically signed by Monica Martinez MD on 03/13/2020 at 5:11:17 PM.    Final    ECHOCARDIOGRAM LIMITED  Result Date: 03/22/2020    ECHOCARDIOGRAM LIMITED REPORT   Patient Name:   [name] [name] [facility] Date of Exam: 03/22/2020 Medical Rec #:  [id number]           Height:       72.0 in Accession #:    [id number]          Weight:       246.3 lb Date of Birth:  [birth year]           BSA:          2.328 m Patient Age:    37 years            BP:           93/67 mmHg Patient Gender: M                   HR:           113 bpm. Exam Location:  Inpatient Procedure: Limited Echo and Color Doppler Indications:    I31.3 (Rule out)Pericardial effusion (noninflammatory)  History:        Patient has prior history of Echocardiogram examinations, most                 recent 03/13/2020. LV DysFx, CAD, Abnormal ECG and Defibrillator,                 Mitral Valve Disease, Arrythmias:Atrial Fibrillation;                 Signs/Symptoms:Chest Pain. Cardiac shock. LVAD. Elevated  troponin.  Sonographer:    Roseanna Rainbow RDCS Referring Phys: [place] Comments: Patient is morbidly obese and Technically difficult study due to poor echo windows. Image acquisition challenging due to patient body habitus and LVAD. Only limited off axis views due to LVAD and wound dressings. IMPRESSIONS  1. Images are poor quality and cannot comment on global LV  function. . Left ventricular ejection fraction, by estimation, is 20 to 25%. The left ventricle has severely decreased function. The left ventricle demonstrates global hypokinesis. There is moderate concentric left ventricular hypertrophy.  2. Aortic dilatation noted. There is mild dilatation of the ascending aorta and of the aortic root measuring 41 mm and 43mm respectively.  3. Right ventricular systolic function is severely reduced. The right ventricular size is moderately enlarged.  4. The aortic valve is tricuspid. Mild aortic valve sclerosis is present, with no evidence of aortic valve stenosis. FINDINGS  Left Ventricle: Images are poor quality and cannot comment on global LV function. Left ventricular ejection fraction, by estimation, is 20 to 25%. The left ventricle has severely decreased function. The left ventricle demonstrates global hypokinesis. There is moderate concentric left ventricular hypertrophy. Right Ventricle: The right ventricular size is moderately enlarged. Right ventricular systolic function is severely reduced. Left Atrium: Left atrial size was not well visualized. Right Atrium: Right atrial size was not well visualized. Tricuspid Valve: Tricuspid valve regurgitation is mild. Aortic Valve: The aortic valve is tricuspid. Mild aortic valve sclerosis is present, with no evidence of aortic valve stenosis. Pulmonic Valve: The pulmonic valve was not well visualized. Aorta: Aortic dilatation noted. There is mild dilatation of the ascending aorta and of the aortic root measuring 41 mm. Additional Comments: A pacer wire is visualized.  LEFT VENTRICLE PLAX 2D LVIDd:         4.60 cm LVIDs:         4.20 cm LV PW:         1.60 cm LV IVS:        1.70 cm  LEFT ATRIUM         Index LA diam:    3.90 cm 1.68 cm/m   AORTA Ao Root diam: 3.90 cm Ao Asc diam:  4.10 cm TRICUSPID VALVE TR Peak grad:   18.8 mmHg TR Vmax:        217.00 cm/s Fransico Him MD Electronically signed by Fransico Him MD Signature  Date/Time: 03/22/2020/2:19:25 PM    Final    US Abdomen Limited RUQ  Result Date: 03/24/2020 CLINICAL DATA:  Cholecystitis. EXAM: ULTRASOUND ABDOMEN LIMITED RIGHT UPPER QUADRANT COMPARISON:  CT abdomen and pelvis 04/08/2020 FINDINGS: Examination was limited due to a small acoustic window from external bandage material as well as patient immobility. Gallbladder: Acoustic shadowing throughout the gallbladder likely secondary to the stones demonstrated on prior CT. The gallbladder wall could not be differentiated from the near surface of the stones. 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 No sonographic Murphy sign noted by sonographer. Common bile duct: Diameter: 4 mm Liver: No focal lesion identified. Within normal limits in parenchymal echogenicity. Portal vein is patent on color Doppler imaging with normal direction of blood flow towards the liver. Other: None. IMPRESSION: Cholelithiasis with limited assessment of the gallbladder wall. No Murphy sign to strongly suggest acute cholecystitis. Electronically Signed   By: Logan Bores M.D.   On: 03/24/2020 11:55    ASSESSMENT AND PLAN:   Elevated  INR -This is likely multifactorial related to cardiogenic shock, multiorgan failure, DIC, poor nutritional status, and underlying cirrhosis. -INR will likely not normalize given underlying cirrhosis and ongoing acute medical issues. -Recommend Vitamin K 5 mg IV X3 days. No need for FFP since he is not actively bleeding.  -Check INR periodically.  -Hematology will sign off. Please re consult if needed.   Thank you for this referral.  Mikey Bussing, DNP, AGPCNP-BC, AOCNP Mon/Tues/Thurs/Fri 7am-5pm; Off Wednesdays Cell: [phone]

## 2020-03-27 NOTE — Progress Notes (Signed)
ANTICOAGULATION CONSULT NOTE  Pharmacy Consult for warfarin Indication: post op LVAD  / Aflutter  Allergies  Allergen Reactions  . Liraglutide Nausea And Vomiting  . Lisinopril Cough    Patient Measurements: Height: 6' (182.9 cm) Weight: 112.4 kg (247 lb 12.8 oz) IBW/kg (Calculated) : 77.6 Heparin Dosing Weight: 101.2 kg  Vital Signs: Temp: 96 F (35.6 C) (04/15 0800) Temp Source: Axillary (04/15 0800) BP: 124/110 (04/15 0800) Pulse Rate: 92 (04/15 0800)  Labs: Recent Labs    03/25/20 0303 03/25/20 0512 03/26/20 0356 03/26/20 0358 03/26/20 1304 03/26/20 1304 03/26/20 1536 03/27/20 0343 03/27/20 0345  HGB 9.1*   < > 9.0*   < > 9.9*   < >  --  8.9* 9.2*  HCT 27.8*   < > 26.9*   < > 29.0*  --   --  27.2* 27.0*  PLT 143*  --  125*  --   --   --   --  139*  --   LABPROT 73.8*  --  41.8*  --   --   --   --  55.4*  --   INR 9.0*  --  4.4*  --   --   --   --  6.2*  --   CREATININE 3.98*   < > 3.18*  --   --   --  2.75* 2.44*  --    < > = values in this interval not displayed.    Estimated Creatinine Clearance: 36.5 mL/min (A) (by C-G formula based on SCr of 2.44 mg/dL (H)).   Medical History: Past Medical History:  Diagnosis Date  . Anxiety   . Arthritis   . CAD (coronary artery disease)    a.  1993 s/p MI - [employer];  b. s/p BMS to LAD '00;  c. PTCA 2nd diagonal 2010;  d. 02/18/12 Cath: moderate nonobs dzs - med rx;  e.  01/2015 Cath: LM nl, LAD 40-72m ISR, 70-50m/d, d1 90p (3.0x16 Synergy DES), D2 50-60, LCX nl, OM1 50p, 41m (2.5x12 Synergy DES), RCA nl, EF 30-35%.  . Chronic combined systolic and diastolic CHF (congestive heart failure) ([place])    a. 12/2014 Echo: EF 30-35%, Gr2 DD, mod MR, sev dil LA.  . CKD (chronic kidney disease), stage III   . Depression   . ED (erectile dysfunction)   . GERD (gastroesophageal reflux disease)   . Hyperlipidemia   . Hypertension   . Ischemic cardiomyopathy    a. s/p [employer] [name] (Atlas) ICD implanted in [place] 2007;  b.  12/2014 Echo: Ef 30-35%.  . MVP (mitral valve prolapse)    a. s/p MV annuloplasty at [facility] 2004.  Marland Kitchen Persistent atrial fibrillation ([place])    a. noted on ICD interrogation '10 - not previously on [place] - CHA2DS2VASc = 5.  . Type II diabetes mellitus ([place])    uncontrolled    Medications:  Medications Prior to Admission  Medication Sig Dispense Refill Last Dose  . ALPRAZolam (XANAX) 0.25 MG tablet Take 0.25 mg by mouth 3 (three) times daily as needed for anxiety.    unknown  . amiodarone (PACERONE) 200 MG tablet Take 200 mg by mouth daily.    03/11/2020 at Unknown time  . apixaban (ELIQUIS) 5 MG TABS tablet Take 1 tablet (5 mg total) by mouth 2 (two) times daily. 60 tablet 1 03/11/2020 at 0600  . atorvastatin (LIPITOR) 40 MG tablet Take 1 tablet (40 mg total) by mouth daily at 6 PM. 30 tablet  0 03/10/2020 at Unknown time  . citalopram (CELEXA) 20 MG tablet Take 20 mg by mouth daily.    03/11/2020 at Unknown time  . furosemide (LASIX) 40 MG tablet Take 40 mg by mouth daily.    03/11/2020 at Unknown time  . insulin aspart (NOVOLOG FLEXPEN) 100 UNIT/ML FlexPen Inject 6 Units into the skin 3 (three) times daily with meals. 15 mL 0 03/11/2020 at Unknown time  . Insulin Glargine (BASAGLAR KWIKPEN) 100 UNIT/ML SOPN Inject 0.5 mLs (50 Units total) into the skin daily. 15 mL 1 03/11/2020 at Unknown time  . metoprolol succinate (TOPROL-XL) 100 MG 24 hr tablet Take 1 tablet (100 mg total) by mouth daily. 90 tablet 3 03/11/2020 at 0600  . nitroGLYCERIN (NITROSTAT) 0.4 MG SL tablet Place 1 tablet (0.4 mg total) under the tongue every 5 (five) minutes x 3 doses as needed for chest pain. 10 tablet 0 unknown  . pantoprazole (PROTONIX) 40 MG tablet Take 1 tablet (40 mg total) by mouth daily. 30 tablet 1 03/11/2020 at Unknown time  . potassium chloride SA (KLOR-CON) 20 MEQ tablet Take 20 mEq by mouth daily.    03/11/2020 at Unknown time  . tamsulosin (FLOMAX) 0.4 MG CAPS capsule Take 1 capsule (0.4 mg total) by mouth  daily. 30 capsule 0 03/11/2020 at Unknown time  . ALPRAZolam (XANAX) 1 MG tablet Take 1 tablet (1 mg total) by mouth 2 (two) times daily as needed for anxiety. (Patient not taking: Reported on 02/20/2020) 10 tablet 0 Not Taking at Unknown time    Assessment: 1 YOM with atrial fibrillation on Eliquis PTA s/p R & L heart cath found to have 3v disease s/p  LVAD  Implant 4/8. Warfarin initiated on 4/9 after discussing with MD.  INR is back up to 6.4 today after receiving vitamin K IV 1 mg on 4/13. LFTs ok. Hgb 8.9, hct 27.2, pltc 139. No bleeding noted   Goal of Therapy:  INR 2-2.5 Heparin level 0.3-0.7 units/ml Monitor platelets by anticoagulation protocol: Yes   Plan:  No warfarin today Daily PT/INR  Monitor s/s bleeding  Vertis Kelch, PharmD, [facility] PGY2 Cardiology Pharmacy Resident Phone [phone] 03/27/2020       8:26 AM  Please check [email or web address] for unit-specific pharmacist phone numbers

## 2020-03-27 NOTE — Progress Notes (Signed)
Attempted patient on 15L NRB to give a break from BIPAP. Pt tolerating well at this time, SPO2 97%. No distress noted. RT will continue to monitor. RN at bedside.

## 2020-03-27 NOTE — Consult Note (Addendum)
Neurology Consultation  Reason for Consult: Stroke postop insertion of implantable left ventricular assist device Referring Physician: Dr. Orvan Seen  CC: Stroke found on CT  History is obtained from: Chart  HPI: [name] [name] is a 69 y.o. male with history of type 2 diabetes, persistent atrial fibrillation, mitral valve prolapse, hypertension, hyperlipidemia, CAD now found to have a large right MCA stroke.  Neurology was asked to evaluate large right MCA stroke.  On 03/21/2011 patient presented to the hospital for surgery, with diagnosis of ICM atrial fibrillation.  Patient was to undergo a redo sternotomy with insertion of implantable left ventricular assist device.  Postop day 7 patient was noted to be agitated the CT head was obtained showing a large posterior right MCA infarct which is new since prior study.  During consultation patient can hear me, attempts to open his eyelids when asked.  Becomes agitated when attempting to do physical exam.  Chart review (no prior neurology notes)  Work up that has been done: -CT head shows a large posterior right MCA infarct  LKW: Unknown tpa given?: no, recent surgery and unknown time of stroke Premorbid modified Rankin scale (mRS): 0 NIH stroke score: 19   Past Medical History:  Diagnosis Date  . Anxiety   . Arthritis   . CAD (coronary artery disease)    a.  1993 s/p MI - [employer];  b. s/p BMS to LAD '00;  c. PTCA 2nd diagonal 2010;  d. 02/18/12 Cath: moderate nonobs dzs - med rx;  e.  01/2015 Cath: LM nl, LAD 40-85m ISR, 70-45m/d, d1 90p (3.0x16 Synergy DES), D2 50-60, LCX nl, OM1 50p, 67m (2.5x12 Synergy DES), RCA nl, EF 30-35%.  . Chronic combined systolic and diastolic CHF (congestive heart failure) ([place])    a. 12/2014 Echo: EF 30-35%, Gr2 DD, mod MR, sev dil LA.  . CKD (chronic kidney disease), stage III   . Depression   . ED (erectile dysfunction)   . GERD (gastroesophageal reflux disease)   . Hyperlipidemia   .  Hypertension   . Ischemic cardiomyopathy    a. s/p St. [name] (Atlas) ICD implanted in [place] 2007;  b. 12/2014 Echo: Ef 30-35%.  . MVP (mitral valve prolapse)    a. s/p MV annuloplasty at [facility] 2004.  Marland Kitchen Persistent atrial fibrillation ([place])    a. noted on ICD interrogation '10 - not previously on [place] - CHA2DS2VASc = 5.  . Type II diabetes mellitus ([place])    uncontrolled    Family History  Problem Relation Age of Onset  . Coronary artery disease Mother   . Colon cancer Mother   . Heart attack Mother   . Heart disease Mother   . Heart failure Mother   . Hypertension Mother   . Malignant hyperthermia Mother   . Colon cancer Sister   . Coronary artery disease Brother   . Heart disease Brother    Social History:   reports that he has never smoked. He has never used smokeless tobacco. He reports current alcohol use. He reports that he does not use drugs.  Medications  Current Facility-Administered Medications:  .   prismasol BGK 4/2.5 infusion, , CRRT, Continuous, Elmarie Shiley, MD, Last Rate: 500 mL/hr at 03/27/20 1234, New Bag at 03/27/20 1234 .   prismasol BGK 4/2.5 infusion, , CRRT, Continuous, Elmarie Shiley, MD, Last Rate: 200 mL/hr at 03/26/20 1130, New Bag at 03/26/20 1130 .  0.45 % sodium chloride infusion, , Intravenous, Continuous PRN, Fredrich Romans  Z, MD, Stopped at 03/22/20 2011 .  0.9 %  sodium chloride infusion (Manually program via Guardrails IV Fluids), , Intravenous, Once, Wilburn Cornelia, CRNA .  0.9 %  sodium chloride infusion, , Intravenous, Continuous, Lenn Cal, DDS, Stopped at 03/27/20 1144 .  0.9 %  sodium chloride infusion, 250 mL, Intravenous, Continuous, Atkins, Broadus Z, MD .  0.9 %  sodium chloride infusion, , Intravenous, Continuous, Atkins, Glenice Bow, MD, Last Rate: 20 mL/hr at 03/27/20 0500, Rate Verify at 03/27/20 0500 .  Place/Maintain arterial line, , , Until Discontinued **AND** 0.9 %  sodium chloride infusion, , Intra-arterial, PRN, Prescott Gum, Collier Salina, MD .  acetaminophen (TYLENOL) tablet 650 mg, 650 mg, Per Tube, Q4H PRN, Atkins, Glenice Bow, MD .  amiodarone (NEXTERONE PREMIX) 360-4.14 MG/200ML-% (1.8 mg/mL) IV infusion, 30 mg/hr, Intravenous, Continuous, Larey Dresser, MD, Last Rate: 16.67 mL/hr at 03/27/20 1200, 30 mg/hr at 03/27/20 1200 .  aspirin chewable tablet 81 mg, 81 mg, Per Tube, Daily, Atkins, Glenice Bow, MD, 81 mg at 03/27/20 1014 .  atorvastatin (LIPITOR) tablet 80 mg, 80 mg, Per Tube, q1800, Wonda Olds, MD, 80 mg at 03/26/20 1547 .  B-complex with vitamin C tablet 1 tablet, 1 tablet, Per Tube, Daily, Orvan Seen, Glenice Bow, MD, 1 tablet at 03/27/20 1014 .  bisacodyl (DULCOLAX) EC tablet 10 mg, 10 mg, Oral, Daily **OR** bisacodyl (DULCOLAX) suppository 10 mg, 10 mg, Rectal, Daily, Atkins, Broadus Z, MD .  ceFEPIme (MAXIPIME) 2 g in sodium chloride 0.9 % 100 mL IVPB, 2 g, Intravenous, Q12H, Candee Furbish, MD, Stopped at 03/27/20 1051 .  chlorhexidine (PERIDEX) 0.12 % solution 15 mL, 15 mL, Mouth Rinse, BID, Atkins, Broadus Z, MD, 15 mL at 03/27/20 1000 .  Chlorhexidine Gluconate Cloth 2 % PADS 6 each, 6 each, Topical, Daily, Teena Dunk F, DDS, 6 each at 03/27/20 1100 .  dexmedetomidine (PRECEDEX) 400 MCG/100ML (4 mcg/mL) infusion, 0.4-1.2 mcg/kg/hr, Intravenous, Titrated, Orvan Seen, Glenice Bow, MD, Stopped at 03/27/20 0804 .  dextrose 50 % solution 0-50 mL, 0-50 mL, Intravenous, PRN, Atkins, Broadus Z, MD .  docusate sodium (COLACE) capsule 200 mg, 200 mg, Oral, Daily, Orvan Seen, Glenice Bow, MD, 200 mg at 03/24/20 0934 .  feeding supplement (PRO-STAT SUGAR FREE 64) liquid 30 mL, 30 mL, Per Tube, Daily, Atkins, Glenice Bow, MD, 30 mL at 03/27/20 1014 .  feeding supplement (VITAL 1.5 CAL) liquid 1,000 mL, 1,000 mL, Per Tube, Continuous, Atkins, Glenice Bow, MD, Last Rate: 40 mL/hr at 03/27/20 0700, Rate Verify at 03/27/20 0700 .  fentaNYL (SUBLIMAZE) injection 25 mcg, 25 mcg, Intravenous, Q1H PRN, Wonda Olds, MD, 25  mcg at 03/26/20 0918 .  heparin injection 1,000-6,000 Units, 1,000-6,000 Units, CRRT, PRN, Wonda Olds, MD, 2,800 Units at 03/27/20 0800 .  heparin injection 1,000-6,000 Units, 1,000-6,000 Units, CRRT, PRN, Elmarie Shiley, MD .  hydrALAZINE (APRESOLINE) injection 20 mg, 20 mg, Intravenous, Q4H PRN, Wonda Olds, MD, 20 mg at 03/26/20 1042 .  HYDROcodone-acetaminophen (NORCO/VICODIN) 5-325 MG per tablet 1-2 tablet, 1-2 tablet, Per Tube, Q6H PRN, Atkins, Broadus Z, MD .  influenza vaccine adjuvanted (FLUAD) injection 0.5 mL, 0.5 mL, Intramuscular, Tomorrow-1000, Shelva Majestic A, MD .  insulin aspart (novoLOG) injection 0-24 Units, 0-24 Units, Subcutaneous, Q4H, Orvan Seen, Glenice Bow, MD, 8 Units at 03/27/20 1224 .  lactated ringers infusion 500 mL, 500 mL, Intravenous, Once PRN, Atkins, Glenice Bow, MD .  lactated ringers infusion, , Intravenous, Continuous, Atkins, Glenice Bow, MD, Last  Rate: 10 mL/hr at 03/27/20 1200, Rate Verify at 03/27/20 1200 .  levalbuterol (XOPENEX) nebulizer solution 0.63 mg, 0.63 mg, Nebulization, Q6H PRN, Clegg, Amy D, NP .  LORazepam (ATIVAN) injection 0.5 mg, 0.5 mg, Intravenous, Q6H PRN, Wonda Olds, MD, 0.5 mg at 03/26/20 2134 .  MEDLINE mouth rinse, 15 mL, Mouth Rinse, BID, Larey Dresser, MD, 15 mL at 03/27/20 1000 .  MEDLINE mouth rinse, 15 mL, Mouth Rinse, q12n4p, Atkins, Broadus Z, MD, 15 mL at 03/27/20 1200 .  melatonin tablet 3 mg, 3 mg, Oral, QHS, Candee Furbish, MD .  milrinone (PRIMACOR) 20 MG/100 ML (0.2 mg/mL) infusion, 0.375 mcg/kg/min, Intravenous, Continuous, Atkins, Glenice Bow, MD, Last Rate: 11.59 mL/hr at 03/27/20 1200, 0.375 mcg/kg/min at 03/27/20 1200 .  norepinephrine (LEVOPHED) 4mg  in 240mL premix infusion, 0-12 mcg/min, Intravenous, Titrated, Orvan Seen, Glenice Bow, MD, Stopped at 03/21/20 1049 .  ondansetron (ZOFRAN) injection 4 mg, 4 mg, Intravenous, Q6H PRN, Teena Dunk F, DDS, 4 mg at 03/22/20 1211 .  ondansetron (ZOFRAN) injection 4  mg, 4 mg, Intravenous, Q6H PRN, Orvan Seen, Glenice Bow, MD, 4 mg at 03/23/20 1058 .  oxyCODONE (Oxy IR/ROXICODONE) immediate release tablet 5-10 mg, 5-10 mg, Per Tube, Q3H PRN, Atkins, Broadus Z, MD .  pantoprazole (PROTONIX) injection 40 mg, 40 mg, Intravenous, Daily, Atkins, Glenice Bow, MD, 40 mg at 03/27/20 1014 .  prismasol BGK 4/2.5 infusion, , CRRT, Continuous, Elmarie Shiley, MD, Last Rate: 1,500 mL/hr at 03/27/20 1032, New Bag at 03/27/20 1032 .  sildenafil (REVATIO) tablet 20 mg, 20 mg, Per Tube, TID, Orvan Seen, Glenice Bow, MD, 20 mg at 03/27/20 1014 .  sodium chloride 0.9 % primer fluid for CRRT, , CRRT, PRN, Elmarie Shiley, MD, Given at 03/25/20 1002 .  sodium chloride flush (NS) 0.9 % injection 10-40 mL, 10-40 mL, Intracatheter, Q12H, Atkins, Glenice Bow, MD, 10 mL at 03/27/20 1014 .  sodium chloride flush (NS) 0.9 % injection 10-40 mL, 10-40 mL, Intracatheter, PRN, Atkins, Broadus Z, MD .  sodium chloride flush (NS) 0.9 % injection 3 mL, 3 mL, Intravenous, Q12H, Atkins, Broadus Z, MD, 10 mL at 03/27/20 0800 .  sodium chloride flush (NS) 0.9 % injection 3 mL, 3 mL, Intravenous, PRN, Wonda Olds, MD, 3 mL at 03/28/2020 2118 .  sorbitol 70 % solution 15 mL, 15 mL, Oral, Daily PRN, Lajuana Matte, MD, 15 mL at 03/26/20 1817 .  traMADol (ULTRAM) tablet 50-100 mg, 50-100 mg, Per Tube, Q4H PRN, Atkins, Broadus Z, MD .  vancomycin (VANCOREADY) IVPB 1250 mg/250 mL, 1,250 mg, Intravenous, Q24H, Candee Furbish, MD, Stopped at 03/26/20 1311 .  Warfarin - Pharmacist Dosing Inpatient, , Does not apply, q1600, Orvan Seen, Glenice Bow, MD, Stopped at 03/24/20 1600  ROS:  Unable to obtain due to altered mental status.    Exam: Current vital signs: BP (!) 137/118   Pulse 94   Temp (!) 96 F (35.6 C) (Axillary)   Resp 15   Ht 6' (1.829 m)   Wt 112.4 kg   SpO2 96%   BMI 33.61 kg/m  Vital signs in last 24 hours: Temp:  [96 F (35.6 C)-97.5 F (36.4 C)] 96 F (35.6 C) (04/15 0800) Pulse Rate:  [44-174] 94  (04/15 1200) Resp:  [9-31] 15 (04/15 1200) BP: (74-161)/(56-134) 137/118 (04/15 1200) SpO2:  [82 %-99 %] 96 % (04/15 1200) Arterial Line BP: (56-143)/(43-95) 108/75 (04/15 1200) FiO2 (%):  [50 %] 50 % (04/15 0800) Weight:  [112.4 kg] 112.4 kg (  04/15 0500)   Constitutional: Appears well-developed and well-nourished.  Eyes: No scleral injection HENT: No OP obstrucion Head: Normocephalic.  Cardiovascular: Irregular irregular rhythm Respiratory: Effort normal, non-labored breathing GI: Soft.  No distension. There is no tenderness.  Skin: WDI  Neuro: Mental Status: Patient is currently breathing with a nonrebreather, resting comfortably, does not answer any of my questions and becomes agitated when attempting to do formal exam.  He does wiggle his toes to command, but does not reliably follow other commands.  Cranial Nerves: II: No blink to threat on the left III,IV, VI: Patient is able to cross midline in both directions . Pupils equal, round and reactive to light V: Winces to pain VII: Left facial droop VIII: hearing is intact to voice Motor: He does not cooperate with formal strength testing, he appears to move his right arm more than his left, wiggles toes bilaterally to command. Sensory: Winces to pain on the right upper and lower extremities.  Appears to wince from pain on the left upper but not lower extremity. Deep Tendon Reflexes: I cannot elicit any ankle or knee jerk but he does have 2+ at the brachioradialis Plantars: Toes are downgoing bilaterally.  Cerebellar: Unable to assess  Labs I have reviewed labs in epic and the results pertinent to this consultation are:   CBC    Component Value Date/Time   WBC 10.4 03/27/2020 0343   RBC 3.00 (L) 03/27/2020 0343   HGB 9.2 (L) 03/27/2020 0345   HGB 15.8 04/24/2018 1148   HCT 27.0 (L) 03/27/2020 0345   HCT 45.9 04/24/2018 1148   PLT 139 (L) 03/27/2020 0343   PLT 281 04/24/2018 1148   MCV 90.7 03/27/2020 0343   MCV  89 04/24/2018 1148   MCH 29.7 03/27/2020 0343   MCHC 32.7 03/27/2020 0343   RDW 14.7 03/27/2020 0343   RDW 14.2 04/24/2018 1148   LYMPHSABS 0.3 (L) 03/27/2020 0343   LYMPHSABS 1.1 04/24/2018 1148   MONOABS 0.7 03/27/2020 0343   EOSABS 0.0 03/27/2020 0343   EOSABS 0.2 04/24/2018 1148   BASOSABS 0.0 03/27/2020 0343   BASOSABS 0.1 04/24/2018 1148    CMP     Component Value Date/Time   NA 138 03/27/2020 0345   NA 132 (L) 03/07/2020 1501   K 3.5 03/27/2020 0345   CL 103 03/27/2020 0343   CO2 24 03/27/2020 0343   GLUCOSE 129 (H) 03/27/2020 0343   BUN 42 (H) 03/27/2020 0343   BUN 16 03/07/2020 1501   CREATININE 2.44 (H) 03/27/2020 0343   CREATININE 1.29 (H) 12/10/2016 1011   CALCIUM 8.3 (L) 03/27/2020 0343   PROT 5.7 (L) 03/27/2020 0343   PROT 6.9 04/24/2018 1148   ALBUMIN 2.5 (L) 03/27/2020 0343   ALBUMIN 2.6 (L) 03/27/2020 0343   ALBUMIN 4.5 04/24/2018 1148   AST 38 03/27/2020 0343   ALT 25 03/27/2020 0343   ALKPHOS 94 03/27/2020 0343   BILITOT 1.3 (H) 03/27/2020 0343   BILITOT 0.7 04/24/2018 1148   GFRNONAA 26 (L) 03/27/2020 0343   GFRNONAA 57 (L) 01/28/2015 1558   GFRAA 30 (L) 03/27/2020 0343   GFRAA 66 01/28/2015 1558    Lipid Panel     Component Value Date/Time   CHOL 89 03/23/2020 1312   TRIG 69 04/03/2020 1312   HDL 33 (L) 04/06/2020 1312   CHOLHDL 2.7 04/11/2020 1312   VLDL 14 03/28/2020 1312   LDLCALC 42 03/22/2020 1312   Patient has been off warfarin for several days now  and patient INR remains 6.2.  Imaging I have reviewed the images obtained:  -CT head shows a large posterior right MCA infarct  -Echocardiogram on 03/22/2020.  Shows left ventricular ejection fraction estimated to be 20 to 25% with severely decreased function.  Left ventricle demonstrates global hypokinesis.  Etta Quill PA-C [facility] [phone]  M-F  (9:00 am- 5:00 PM)  03/27/2020, 1:13 PM   I have seen the patient reviewed the above note.  Assessment:  This  is a unfortunate 69 year old male with a posterior division MCA infarct on the right after cardiac surgery.  Most likely this is embolic, however I would favor getting some vascular imaging of his neck.  He is currently supratherapeutic, which does increase the risk of hemorrhagic transformation but there is also risk of rapid overcorrection of the INR and therefore I agree with the more gentle approach of giving vitamin K.  He is likely to have more prolonged rehab need due to this.  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 -Stroke team to follow  Roland Rack, MD [facility] [phone]  If 7pm- 7am, please page neurology on call as listed in [place].

## 2020-03-28 ENCOUNTER — Inpatient Hospital Stay (HOSPITAL_COMMUNITY): Payer: Medicare HMO

## 2020-03-28 DIAGNOSIS — Z515 Encounter for palliative care: Secondary | ICD-10-CM | POA: Diagnosis not present

## 2020-03-28 DIAGNOSIS — Z95811 Presence of heart assist device: Secondary | ICD-10-CM | POA: Diagnosis not present

## 2020-03-28 DIAGNOSIS — N179 Acute kidney failure, unspecified: Secondary | ICD-10-CM | POA: Diagnosis not present

## 2020-03-28 DIAGNOSIS — R570 Cardiogenic shock: Secondary | ICD-10-CM | POA: Diagnosis not present

## 2020-03-28 LAB — RENAL FUNCTION PANEL
Albumin: 2.5 g/dL — ABNORMAL LOW (ref 3.5–5.0)
Albumin: 2.6 g/dL — ABNORMAL LOW (ref 3.5–5.0)
Anion gap: 10 (ref 5–15)
Anion gap: 14 (ref 5–15)
BUN: 38 mg/dL — ABNORMAL HIGH (ref 8–23)
BUN: 36 mg/dL — ABNORMAL HIGH (ref 8–23)
CO2: 24 mmol/L (ref 22–32)
CO2: 22 mmol/L (ref 22–32)
Calcium: 8.2 mg/dL — ABNORMAL LOW (ref 8.9–10.3)
Calcium: 8.4 mg/dL — ABNORMAL LOW (ref 8.9–10.3)
Chloride: 102 mmol/L (ref 98–111)
Chloride: 100 mmol/L (ref 98–111)
Creatinine, Ser: 2.41 mg/dL — ABNORMAL HIGH (ref 0.61–1.24)
Creatinine, Ser: 2.23 mg/dL — ABNORMAL HIGH (ref 0.61–1.24)
GFR calc Af Amer: 30 mL/min — ABNORMAL LOW (ref >60)
GFR calc Af Amer: 33 mL/min — ABNORMAL LOW (ref >60)
GFR calc non Af Amer: 26 mL/min — ABNORMAL LOW (ref >60)
GFR calc non Af Amer: 29 mL/min — ABNORMAL LOW (ref >60)
Glucose, Bld: 231 mg/dL — ABNORMAL HIGH (ref 70–99)
Glucose, Bld: 239 mg/dL — ABNORMAL HIGH (ref 70–99)
Phosphorus: 2.2 mg/dL — ABNORMAL LOW (ref 2.5–4.6)
Phosphorus: 3.4 mg/dL (ref 2.5–4.6)
Potassium: 4.1 mmol/L (ref 3.5–5.1)
Potassium: 4.2 mmol/L (ref 3.5–5.1)
Sodium: 136 mmol/L (ref 135–145)
Sodium: 136 mmol/L (ref 135–145)

## 2020-03-28 LAB — GLUCOSE, CAPILLARY
Glucose-Capillary: 191 mg/dL — ABNORMAL HIGH (ref 70–99)
Glucose-Capillary: 202 mg/dL — ABNORMAL HIGH (ref 70–99)
Glucose-Capillary: 215 mg/dL — ABNORMAL HIGH (ref 70–99)
Glucose-Capillary: 215 mg/dL — ABNORMAL HIGH (ref 70–99)
Glucose-Capillary: 225 mg/dL — ABNORMAL HIGH (ref 70–99)
Glucose-Capillary: 230 mg/dL — ABNORMAL HIGH (ref 70–99)
Glucose-Capillary: 233 mg/dL — ABNORMAL HIGH (ref 70–99)

## 2020-03-28 LAB — CBC
HCT: 30.4 % — ABNORMAL LOW (ref 39.0–52.0)
Hemoglobin: 9.8 g/dL — ABNORMAL LOW (ref 13.0–17.0)
MCH: 29.9 pg (ref 26.0–34.0)
MCHC: 32.2 g/dL (ref 30.0–36.0)
MCV: 92.7 fL (ref 80.0–100.0)
Platelets: 158 10*3/uL (ref 150–400)
RBC: 3.28 MIL/uL — ABNORMAL LOW (ref 4.22–5.81)
RDW: 14.9 % (ref 11.5–15.5)
WBC: 14.7 10*3/uL — ABNORMAL HIGH (ref 4.0–10.5)
nRBC: 0.7 % — ABNORMAL HIGH (ref 0.0–0.2)

## 2020-03-28 LAB — POCT I-STAT 7, (LYTES, BLD GAS, ICA,H+H)
Acid-Base Excess: 1 mmol/L (ref 0.0–2.0)
Bicarbonate: 26.1 mmol/L (ref 20.0–28.0)
Calcium, Ion: 1.17 mmol/L (ref 1.15–1.40)
HCT: 28 % — ABNORMAL LOW (ref 39.0–52.0)
Hemoglobin: 9.5 g/dL — ABNORMAL LOW (ref 13.0–17.0)
O2 Saturation: 99 %
Patient temperature: 96.7
Potassium: 4.2 mmol/L (ref 3.5–5.1)
Sodium: 137 mmol/L (ref 135–145)
TCO2: 27 mmol/L (ref 22–32)
pCO2 arterial: 38.4 mmHg (ref 32.0–48.0)
pH, Arterial: 7.436 (ref 7.350–7.450)
pO2, Arterial: 115 mmHg — ABNORMAL HIGH (ref 83.0–108.0)

## 2020-03-28 LAB — COOXEMETRY PANEL
Carboxyhemoglobin: 1 % (ref 0.5–1.5)
Methemoglobin: 0.6 % (ref 0.0–1.5)
O2 Saturation: 60.3 %
Total hemoglobin: 11.1 g/dL — ABNORMAL LOW (ref 12.0–16.0)

## 2020-03-28 LAB — PROTIME-INR
INR: 2.2 — ABNORMAL HIGH (ref 0.8–1.2)
Prothrombin Time: 24.1 seconds — ABNORMAL HIGH (ref 11.4–15.2)

## 2020-03-28 LAB — MAGNESIUM: Magnesium: 2.6 mg/dL — ABNORMAL HIGH (ref 1.7–2.4)

## 2020-03-28 LAB — LACTATE DEHYDROGENASE: LDH: 454 U/L — ABNORMAL HIGH (ref 98–192)

## 2020-03-28 MED ORDER — SODIUM PHOSPHATES 45 MMOLE/15ML IV SOLN
30.0000 mmol | Freq: Once | INTRAVENOUS | Status: AC
  Administered 2020-03-28: 10:00:00 30 mmol via INTRAVENOUS
  Filled 2020-03-28: qty 10

## 2020-03-28 MED ORDER — MELATONIN 3 MG PO TABS
3.0000 mg | ORAL_TABLET | Freq: Every day | ORAL | Status: DC
  Administered 2020-03-28 – 2020-04-11 (×14): 3 mg
  Filled 2020-03-28 (×15): qty 1

## 2020-03-28 MED ORDER — DOCUSATE SODIUM 50 MG/5ML PO LIQD
200.0000 mg | Freq: Every day | ORAL | Status: DC
  Administered 2020-03-28 – 2020-04-11 (×13): 200 mg
  Filled 2020-03-28 (×16): qty 20

## 2020-03-28 MED ORDER — INSULIN GLARGINE 100 UNIT/ML ~~LOC~~ SOLN
10.0000 [IU] | Freq: Every day | SUBCUTANEOUS | Status: DC
  Administered 2020-03-28 – 2020-03-29 (×2): 10 [IU] via SUBCUTANEOUS
  Filled 2020-03-28 (×2): qty 0.1

## 2020-03-28 MED ORDER — SORBITOL 70 % SOLN: 15.0000 mL | Freq: Every day | Status: DC | PRN

## 2020-03-28 NOTE — Progress Notes (Signed)
ANTICOAGULATION CONSULT NOTE  Pharmacy Consult for warfarin Indication: post op LVAD  / Aflutter  Allergies  Allergen Reactions  . Liraglutide Nausea And Vomiting  . Lisinopril Cough    Patient Measurements: Height: 6' (182.9 cm) Weight: 107.9 kg (237 lb 14 oz) IBW/kg (Calculated) : 77.6 Heparin Dosing Weight: 101.2 kg  Vital Signs: Temp: 96.3 F (35.7 C) (04/16 1130) Temp Source: Axillary (04/16 1130) BP: 93/61 (04/16 1354) Pulse Rate: 121 (04/16 1500)  Labs: Recent Labs    03/26/20 0356 03/26/20 0358 03/27/20 0343 03/27/20 0343 03/27/20 0345 03/27/20 0345 03/27/20 1541 03/28/20 0433 03/28/20 0441 03/28/20 1013  HGB 9.0*   < > 8.9*   < > 9.2*   < >  --   --  9.5* 9.8*  HCT 26.9*   < > 27.2*   < > 27.0*  --   --   --  28.0* 30.4*  PLT 125*  --  139*  --   --   --   --   --   --  158  LABPROT 41.8*  --  55.4*  --   --   --   --  24.1*  --   --   INR 4.4*  --  6.2*  --   --   --   --  2.2*  --   --   CREATININE 3.18*   < > 2.44*  --   --   --  2.47* 2.41*  --   --    < > = values in this interval not displayed.    Estimated Creatinine Clearance: 36.2 mL/min (A) (by C-G formula based on SCr of 2.41 mg/dL (H)).   Medical History: Past Medical History:  Diagnosis Date  . Anxiety   . Arthritis   . CAD (coronary artery disease)    a.  1993 s/p MI - [employer];  b. s/p BMS to LAD '00;  c. PTCA 2nd diagonal 2010;  d. 02/18/12 Cath: moderate nonobs dzs - med rx;  e.  01/2015 Cath: LM nl, LAD 40-40m ISR, 70-31m/d, d1 90p (3.0x16 Synergy DES), D2 50-60, LCX nl, OM1 50p, 56m (2.5x12 Synergy DES), RCA nl, EF 30-35%.  . Chronic combined systolic and diastolic CHF (congestive heart failure) ([place])    a. 12/2014 Echo: EF 30-35%, Gr2 DD, mod MR, sev dil LA.  . CKD (chronic kidney disease), stage III   . Depression   . ED (erectile dysfunction)   . GERD (gastroesophageal reflux disease)   . Hyperlipidemia   . Hypertension   . Ischemic cardiomyopathy    a. s/p St. [name]  (Atlas) ICD implanted in [place] 2007;  b. 12/2014 Echo: Ef 30-35%.  . MVP (mitral valve prolapse)    a. s/p MV annuloplasty at [facility] 2004.  Marland Kitchen Persistent atrial fibrillation ([place])    a. noted on ICD interrogation '10 - not previously on [place] - CHA2DS2VASc = 5.  . Type II diabetes mellitus ([place])    uncontrolled    Medications:  Medications Prior to Admission  Medication Sig Dispense Refill Last Dose  . ALPRAZolam (XANAX) 0.25 MG tablet Take 0.25 mg by mouth 3 (three) times daily as needed for anxiety.    unknown  . amiodarone (PACERONE) 200 MG tablet Take 200 mg by mouth daily.    03/11/2020 at Unknown time  . apixaban (ELIQUIS) 5 MG TABS tablet Take 1 tablet (5 mg total) by mouth 2 (two) times daily. 60 tablet 1 03/11/2020 at 0600  .  atorvastatin (LIPITOR) 40 MG tablet Take 1 tablet (40 mg total) by mouth daily at 6 PM. 30 tablet 0 03/10/2020 at Unknown time  . citalopram (CELEXA) 20 MG tablet Take 20 mg by mouth daily.    03/11/2020 at Unknown time  . furosemide (LASIX) 40 MG tablet Take 40 mg by mouth daily.    03/11/2020 at Unknown time  . insulin aspart (NOVOLOG FLEXPEN) 100 UNIT/ML FlexPen Inject 6 Units into the skin 3 (three) times daily with meals. 15 mL 0 03/11/2020 at Unknown time  . Insulin Glargine (BASAGLAR KWIKPEN) 100 UNIT/ML SOPN Inject 0.5 mLs (50 Units total) into the skin daily. 15 mL 1 03/11/2020 at Unknown time  . metoprolol succinate (TOPROL-XL) 100 MG 24 hr tablet Take 1 tablet (100 mg total) by mouth daily. 90 tablet 3 03/11/2020 at 0600  . nitroGLYCERIN (NITROSTAT) 0.4 MG SL tablet Place 1 tablet (0.4 mg total) under the tongue every 5 (five) minutes x 3 doses as needed for chest pain. 10 tablet 0 unknown  . pantoprazole (PROTONIX) 40 MG tablet Take 1 tablet (40 mg total) by mouth daily. 30 tablet 1 03/11/2020 at Unknown time  . potassium chloride SA (KLOR-CON) 20 MEQ tablet Take 20 mEq by mouth daily.    03/11/2020 at Unknown time  . tamsulosin (FLOMAX) 0.4 MG CAPS  capsule Take 1 capsule (0.4 mg total) by mouth daily. 30 capsule 0 03/11/2020 at Unknown time  . ALPRAZolam (XANAX) 1 MG tablet Take 1 tablet (1 mg total) by mouth 2 (two) times daily as needed for anxiety. (Patient not taking: Reported on 03/11/2020) 10 tablet 0 Not Taking at Unknown time    Assessment: 64 YOM with atrial fibrillation on Eliquis PTA s/p R & L heart cath found to have 3v disease s/p  LVAD  Implant 4/8. Warfarin initiated on 4/9 after discussing with MD.  CT on 4/15 now found to have large right MCA stroke. INR had been elevated up to 9.0 postop, he received 1 dose of vitamin K 1 mg IV on 4/13 and again on 4/15 with INR still elevated at 6.2.   INR today is now down to 2.2. Hgb 9.8, pltc 158. No bleeding reported.  After discussing with MD, will continue to hold warfarin today since after his first dose of IV vitamin K, his INR bounced back up from 4.4 to 6.2 without giving any warfarin or vitamin K.  Goal of Therapy:  INR 2-2.5 Heparin level 0.3-0.7 units/ml Monitor platelets by anticoagulation protocol: Yes   Plan:  No warfarin today Pending INR in AM, may be able to resume warfarin vs redosing low dose vitamin K IV (would not give more than 1 mg at a time) Daily PT/INR  Monitor s/s bleeding  Vertis Kelch, PharmD, [facility] PGY2 Cardiology Pharmacy Resident Phone [phone] 03/28/2020       3:11 PM  Please check [email or web address] for unit-specific pharmacist phone numbers

## 2020-03-28 NOTE — Plan of Care (Signed)
  Problem: Cardiovascular: Goal: Ability to achieve and maintain adequate cardiovascular perfusion will improve Outcome: Progressing   Problem: Cardiovascular: Goal: Ability to achieve and maintain adequate cardiovascular perfusion will improve Outcome: Progressing   Problem: Clinical Measurements: Goal: Will remain free from infection Outcome: Adequate for Discharge Goal: Diagnostic test results will improve Outcome: Progressing Goal: Respiratory complications will improve Outcome: Progressing Goal: Cardiovascular complication will be avoided Outcome: Progressing   Problem: Activity: Goal: Risk for activity intolerance will decrease Outcome: Progressing   Problem: Nutrition: Goal: Adequate nutrition will be maintained Outcome: Progressing   Problem: Elimination: Goal: Will not experience complications related to bowel motility Outcome: Progressing   Problem: Safety: Goal: Ability to remain free from injury will improve Outcome: Adequate for Discharge   Problem: Skin Integrity: Goal: Risk for impaired skin integrity will decrease Outcome: Progressing   Problem: Education: Goal: Knowledge of patient specific risk factors addressed and post discharge goals established will improve Outcome: Progressing

## 2020-03-28 NOTE — Progress Notes (Signed)
CT surgery p.m. Rounds  Patient breathing comfortably and vocalizing, moving all extremities but left side is weaker Rate controlled atrial fibrillation on IV amiodarone Hemodynamics stable on low-dose milrinone with good LVAD flow CVVH removing 100 cc hourly

## 2020-03-28 NOTE — Progress Notes (Signed)
PULMONARY / CRITICAL CARE MEDICINE   NAME:  [name] [name], MRN:  [id number], DOB:  01/04/1951, LOS: 13 ADMISSION DATE:  02/24/2020, CONSULTATION DATE:  03/27/20 REFERRING MD:  Aundra Dubin, CHIEF COMPLAINT:  Respiratory failure  BRIEF HISTORY:    69 year old man with advanced ischemic cardiomyopathy, prior mitral valve repair, afib presented for redo sternotomy, LVAD implantation, left atrial appendage clipping, and sternal reconstruction on 03/19/2020.  PCCM consulted on 4/15 for persistent respiratory failure.  HISTORY OF PRESENT ILLNESS   69 year old man with advanced ischemic cardiomyopathy, prior mitral valve repair, afib presented for redo sternotomy, LVAD implantation, left atrial appendage clipping, and sternal reconstruction on 04/06/2020.    Postoperative course complicated by persistent delirium, RV dysfunction, renal failure, abnormal INR of unclear etiology.  Now on CRRT.  Over past several days has become less responsive.   Went for head CT this AM showing acute posterior R MCA stroke.  Respiratory status has been tenuous post op as well.  Has remained mostly on BIPAP.  History per chart review as patient is nonverbal for me.  Patient started on 5 L [place] and saturating well. He is alert and oriented to self today (4/16).  SIGNIFICANT PAST MEDICAL HISTORY   Ischemic cardiomyopathy MV prolapse s/p repair in 2004 HTN HLD CKD  SIGNIFICANT EVENTS:  4/8: redo sternotomy, LVAD implantation, left atrial appendage clipping, and sternal reconstruction  4/9: extubated  STUDIES:   CT Head 03/27/20 IMPRESSION: Large posterior right MCA infarct, new since prior study.  CT a/p 03/26/2020 IMPRESSION: 1. No acute intra-abdominal or pelvic pathology. No bowel obstruction. Normal appendix. 2. Cirrhosis with small perihepatic ascites. 3. Cholelithiasis. No evidence of acute cholecystitis by CT. 4. Aortic Atherosclerosis (ICD10-I70.0).  4/15 CXR volume overload  Echo 4/10 1. Images  are poor quality and cannot comment on global LV function. .  Left ventricular ejection fraction, by estimation, is 20 to 25%. The left  ventricle has severely decreased function. The left ventricle demonstrates  global hypokinesis. There is  moderate concentric left ventricular hypertrophy.  2. Aortic dilatation noted. There is mild dilatation of the ascending  aorta and of the aortic root measuring 41 mm and 58mm respectively.  3. Right ventricular systolic function is severely reduced. The right  ventricular size is moderately enlarged.  4. The aortic valve is tricuspid. Mild aortic valve sclerosis is present,  with no evidence of aortic valve stenosis.  CULTURES:  COVID swab neg on admission MRSA PCR neg 4/7 Blood cultures x 2 4/13 neg Urine culture 4/12 neg  ANTIBIOTICS:  Vanc 4/12 - 4/19 (planned) Cefepime 4/11 - 4/19 (planned)   LINES/TUBES:  4/8: redo sternotomy, LVAD implantation, left atrial appendage clipping, and sternal reconstruction   RIJ introducer R [place] HD catheter R radial A line R/L pleural drains CONSULTANTS:  CHF, TCTS, PCCM, Nephro, Neuro, Hematology SUBJECTIVE:  Patient resting comfortably in bed on 5L [place] and maintaining good SPO2. He is able to wake up and is oriented to person, but not place or time. He states that he is having chest pain over his suture site, which is point tender.   CONSTITUTIONAL: BP (!) 89/66 (BP Location: Left Arm)   Pulse (!) 109   Temp (!) 96.7 F (35.9 C) (Axillary)   Resp 13   Ht 6' (1.829 m)   Wt 107.9 kg   SpO2 97%   BMI 32.26 kg/m   I/O last 3 completed shifts: In: 7026 [I.V.:1789.7; NG/GT:1692.5; IV Piggyback:867.8] Out: 3785 [YIFOY:7741; Chest  Tube:280]  CVP:  [9 mmHg-27 mmHg] 25 mmHg  Vent Mode: BIPAP FiO2 (%):  [50 %] 50 % Set Rate:  [16 bmp] 16 bmp PEEP:  [5 cmH20] 5 cmH20  PHYSICAL EXAM: Physical Exam  Constitutional: No distress.  HENT:  Head: Normocephalic and atraumatic.  Eyes: Pupils are  equal, round, and reactive to light. EOM are normal.  Cardiovascular: Normal rate and intact distal pulses. Exam reveals no gallop.  No murmur heard. Pulmonary/Chest: Effort normal. No respiratory distress.  Abdominal: Soft. He exhibits no distension. There is no abdominal tenderness.  Musculoskeletal:        General: No edema. Normal range of motion.     Cervical back: Normal range of motion.  Neurological: He is alert. He has normal sensation and intact cranial nerves. He is disoriented. He displays no weakness and facial symmetry. No sensory deficit.  Skin: He is not diaphoretic.    RESOLVED PROBLEM LIST    ASSESSMENT AND PLAN   Postoperative hypoxemic respiratory failure 2/2 to hypervolemia, RV failure, delirium limiting pulmonary hygiene: - Continue to offload fluid as able by CRRT - Continue Joice and titrate as needed - Limit sedating meds as able  S/P LVAD - Management per TCT/CHF team  Right MCA ischemic stroke  - Neuro consulted. Patient has good strength and sensation bilaterally and is able to follow commands.   Delirium: - Continues to have delirium.   - Started on melatonin qHS  SUMMARY OF TODAY'S PLAN:  Continue to wean oxygen as needed.   Best Practice / Goals of Care / Disposition.   Diet: TF Pain/Anxiety/Delirium protocol (if indicated): Hold if able VAP protocol (if indicated): N/A DVT prophylaxis: Warfarin GI prophylaxis: PPI Glucose control: SSI Mobility: BR Code Status: partial Family Communication: per primary Disposition: ICU  LABS  Glucose Recent Labs  Lab 03/27/20 0741 03/27/20 1218 03/27/20 1534 03/27/20 1944 03/27/20 2347 03/28/20 0440  GLUCAP 173* 221* 242* 186* 166* 202*    BMET Recent Labs  Lab 03/26/20 1536 03/26/20 1536 03/27/20 0343 03/27/20 0343 03/27/20 0345 03/27/20 1541 03/28/20 0441  NA 135   < > 138   < > 138 138 137  K 4.0   < > 3.5   < > 3.5 4.3 4.2  CL 100  --  103  --   --  102  --   CO2 21*  --  24  --    --  22  --   BUN 49*  --  42*  --   --  41*  --   CREATININE 2.75*  --  2.44*  --   --  2.47*  --   GLUCOSE 184*  --  129*  --   --  273*  --    < > = values in this interval not displayed.    Liver Enzymes Recent Labs  Lab 03/23/20 1523 03/23/20 1523 03/24/20 0343 03/25/20 1532 03/26/20 1536 03/27/20 0343 03/27/20 1541  AST 64*  --  62*  --   --  38  --   ALT 24  --  26  --   --  25  --   ALKPHOS 74  --  72  --   --  94  --   BILITOT 2.2*  --  1.5*  --   --  1.3*  --   ALBUMIN 2.9*   < > 3.0*   < > 2.5* 2.6*  2.5* 2.7*   < > = values in this  interval not displayed.    Electrolytes Recent Labs  Lab 03/25/20 0303 03/25/20 1532 03/26/20 0356 03/26/20 0356 03/26/20 1536 03/27/20 0343 03/27/20 1541  CALCIUM 8.4*   < > 8.4*   < > 8.5* 8.3* 8.4*  MG 2.3  --  2.5*  --   --  2.6*  --   PHOS 5.4*   < > 4.3   < > 3.5 2.6 2.5   < > = values in this interval not displayed.    CBC Recent Labs  Lab 03/25/20 0303 03/25/20 0512 03/26/20 0356 03/26/20 0358 03/27/20 0343 03/27/20 0345 03/28/20 0441  WBC 11.1*  --  6.8  --  10.4  --   --   HGB 9.1*   < > 9.0*   < > 8.9* 9.2* 9.5*  HCT 27.8*   < > 26.9*   < > 27.2* 27.0* 28.0*  PLT 143*  --  125*  --  139*  --   --    < > = values in this interval not displayed.    ABG Recent Labs  Lab 03/26/20 1304 03/27/20 0345 03/28/20 0441  PHART 7.465* 7.440 7.436  PCO2ART 32.2 36.2 38.4  PO2ART 54.0* 103.0 115.0*    Coag's Recent Labs  Lab 03/25/20 0303 03/26/20 0356 03/27/20 0343  INR 9.0* 4.4* 6.2*    Sepsis Markers Recent Labs  Lab 03/23/20 0944  PROCALCITON 1.96    Cardiac Enzymes No results for input(s): TROPONINI, PROBNP in the last 168 hours.  PAST MEDICAL HISTORY :   He  has a past medical history of Anxiety, Arthritis, CAD (coronary artery disease), Chronic combined systolic and diastolic CHF (congestive heart failure) ([place]), CKD (chronic kidney disease), stage III, Depression, ED (erectile  dysfunction), GERD (gastroesophageal reflux disease), Hyperlipidemia, Hypertension, Ischemic cardiomyopathy, MVP (mitral valve prolapse), Persistent atrial fibrillation ([place]), and Type II diabetes mellitus ([place]).  PAST SURGICAL HISTORY:  He  has a past surgical history that includes Cardiac defibrillator placement (2007); Mitral valve annuloplasty (2004); left heart catheterization with coronary angiogram (N/A, 02/17/2012); implantable cardioverter defibrillator (icd) generator change (N/A, 03/19/2013); left and right heart catheterization with coronary angiogram (N/A, 01/14/2015); TEE without cardioversion (N/A, 09/29/2016); Cardioversion (N/A, 09/29/2016); TEE without cardioversion (N/A, 12/03/2016); Cardioversion (N/A, 12/03/2016); RIGHT/LEFT HEART CATH AND CORONARY ANGIOGRAPHY (N/A, 02/27/2020); Multiple extractions with alveoloplasty (N/A, 03/23/2020); Insertion of implantable left ventricular assist device (N/A, 03/29/2020); MAZE (N/A, 04/11/2020); TEE without cardioversion (N/A, 03/21/2020); and Clipping of atrial appendage (Left, 03/29/2020).  Allergies  Allergen Reactions  . Liraglutide Nausea And Vomiting  . Lisinopril Cough    No current facility-administered medications on file prior to encounter.   Current Outpatient Medications on File Prior to Encounter  Medication Sig  . ALPRAZolam (XANAX) 0.25 MG tablet Take 0.25 mg by mouth 3 (three) times daily as needed for anxiety.   Marland Kitchen amiodarone (PACERONE) 200 MG tablet Take 200 mg by mouth daily.   Marland Kitchen apixaban (ELIQUIS) 5 MG TABS tablet Take 1 tablet (5 mg total) by mouth 2 (two) times daily.  Marland Kitchen atorvastatin (LIPITOR) 40 MG tablet Take 1 tablet (40 mg total) by mouth daily at 6 PM.  . citalopram (CELEXA) 20 MG tablet Take 20 mg by mouth daily.   . furosemide (LASIX) 40 MG tablet Take 40 mg by mouth daily.   . insulin aspart (NOVOLOG FLEXPEN) 100 UNIT/ML FlexPen Inject 6 Units into the skin 3 (three) times daily with meals.  . Insulin Glargine (BASAGLAR  KWIKPEN) 100 UNIT/ML SOPN Inject 0.5 mLs (  50 Units total) into the skin daily.  . metoprolol succinate (TOPROL-XL) 100 MG 24 hr tablet Take 1 tablet (100 mg total) by mouth daily.  . nitroGLYCERIN (NITROSTAT) 0.4 MG SL tablet Place 1 tablet (0.4 mg total) under the tongue every 5 (five) minutes x 3 doses as needed for chest pain.  . pantoprazole (PROTONIX) 40 MG tablet Take 1 tablet (40 mg total) by mouth daily.  . potassium chloride SA (KLOR-CON) 20 MEQ tablet Take 20 mEq by mouth daily.   . tamsulosin (FLOMAX) 0.4 MG CAPS capsule Take 1 capsule (0.4 mg total) by mouth daily.  Marland Kitchen ALPRAZolam (XANAX) 1 MG tablet Take 1 tablet (1 mg total) by mouth 2 (two) times daily as needed for anxiety. (Patient not taking: Reported on 02/22/2020)    FAMILY HISTORY:   His family history includes Colon cancer in his mother and sister; Coronary artery disease in his brother and mother; Heart attack in his mother; Heart disease in his brother and mother; Heart failure in his mother; Hypertension in his mother; Malignant hyperthermia in his mother.  SOCIAL HISTORY:  He  reports that he has never smoked. He has never used smokeless tobacco. He reports current alcohol use. He reports that he does not use drugs.  REVIEW OF SYSTEMS:    Chest pain, but otherwise negative.

## 2020-03-28 NOTE — Progress Notes (Signed)
Palliative:  HPI: 69 yo male with PMH ICM, systolic CHFEF 29%, LAD stenting, s/pAICD, MVP s/p mitral valve repair 2004, atrial fibrillation on anticoagulation, HTN, HLD, diabetes, depression admitted to [facility] with worsening weakness, fatigue, dyspnea, chest pressure and subsequent transfer to [facility] for cardiac cath which showed severe three vessel CAD and ECHO shows EF 10-155 with moderate RV dysfunction. LVAD placed 4/8. 4/10 ECHO shows significant RV dysfunction. Since 4/12 he has had continued decline in mental status and 4/13 began CRRT, required BiPAP support, and INR reversed with Vit K.4/14 INR improved and tolerating CRRT.    I met today at [name] [name]'s bedside. No family present. Daughter, [name] [name], was at bedside yesterday and educated that he has had a stroke. I will not call her today and give her time to process this information. She does have my contact information.   He is off BiPAP and tolerating CRRT. He is more alert and able to talk and follow some commands. He does repeatedly ask for water and complain of dry mouth but this is understandable given his need for BiPAP and limited intake over past few days. He says to me "how can you do this to someone?"  I attempted to review with him his LVAD surgery and complications with his kidneys that has left him in the condition which is why he feels so badly and is so miserable. I encouraged him to be patient and allow his medical team to continue to work with him.   Emotional support provided.   Exam: Alert, still confused but more awake. Agitated and restless. No distress. Breathing regular, unlabored. Abd soft.   Plan: - No change to plans. Watchful waiting to follow his progress following LVAD implant and renal function.  15 min  Vinie Sill, NP Palliative Medicine Team Pager [phone] (Please see [email or web address] for schedule) Team Phone [phone]    Greater than 50%  of this time was spent counseling and  coordinating care related to the above assessment and plan

## 2020-03-28 NOTE — Progress Notes (Addendum)
Patient ID: [name] [name], male   DOB: September 07, 1951, 69 y.o.   MRN: [id number]    Advanced Heart Failure Rounding Note   Subjective:    - S/p HM3 VAD 4/8 w MAZE procedure + LAA clipping. - Extubated 4/9 - 4/12 LVAD speed increased to 5400 --> echo moderate-severe RV dysfunction, septum mildly left shifted, trivial pericardial effusion.  LVAD speed cut back to 5300.  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   He is currently on milrinone 0.375, amiodarone 30 mg/hr.  MAP 80s.  CVVH ongoing pulling net 150 cc/hr, weight down 10 lbs.  CVP 13-14 today with co-ox 60%.   He got 1 mg IV vitamin K yesterday, INR 2.2 today.   He is now on nasal cannula.  He will awaken and follow commands, oriented to person.   LDH 467 => 453 => 454.   Afebrile, remains on empiric cefepime.   VAD Interrogation  Flow 4.7, Speed 5300, Power 3.9, Pulse Index 3.3  No PI events.   Objective:   Weight Range:  Vital Signs:   Temp:  [93.5 F (34.2 C)-97.7 F (36.5 C)] 96.7 F (35.9 C) (04/16 0400) Pulse Rate:  [44-146] 97 (04/16 0715) Resp:  [11-27] 14 (04/16 0715) BP: (86-161)/(66-134) 127/99 (04/16 0700) SpO2:  [78 %-99 %] 98 % (04/16 0715) Arterial Line BP: (81-143)/(59-95) 102/71 (04/16 0715) FiO2 (%):  [50 %] 50 % (04/15 0800) Weight:  [107.9 kg] 107.9 kg (04/16 0400) Last BM Date: 03/27/20  Weight change: Filed Weights   03/26/20 0424 03/27/20 0500 03/28/20 0400  Weight: 117.9 kg 112.4 kg 107.9 kg    Intake/Output:   Intake/Output Summary (Last 24 hours) at 03/28/2020 0756 Last data filed at 03/28/2020 0700 Gross per 24 hour  Intake 3040.13 ml  Output 5838 ml  Net -2797.87 ml       Physical Exam: CVP 13-14  General: NAD HEENT: Will not open eyes. Neck: Supple, JVP 10-12 cm. Carotids OK.  Cardiac:  Mechanical heart sounds with LVAD hum present.  Lungs:  CTAB, normal effort.  Abdomen:  NT, ND, no HSM. No bruits or masses. +BS  LVAD exit site: Well-healed and  incorporated. Dressing dry and intact. No erythema or drainage. Stabilization device present and accurately applied. Driveline dressing changed daily per sterile technique. Extremities:  Warm and dry. No cyanosis, clubbing, rash, or edema.  Neuro:  Oriented to person and follows commands.     Telemetry: A fib 80s   Labs: Basic Metabolic Panel: Recent Labs  Lab 03/24/20 0343 03/24/20 0404 03/25/20 0303 03/25/20 0512 03/26/20 0356 03/26/20 0358 03/26/20 1536 03/26/20 1536 03/27/20 0343 03/27/20 0345 03/27/20 1541 03/28/20 0433 03/28/20 0441  NA 134*   < > 130*   < > 134*   < > 135   < > 138 138 138 136 137  K 3.4*   < > 3.5   < > 4.0   < > 4.0   < > 3.5 3.5 4.3 4.1 4.2  CL 97*   < > 94*   < > 99  --  100  --  103  --  102 102  --   CO2 20*   < > 18*   < > 20*  --  21*  --  24  --  22 24  --   GLUCOSE 108*   < > 178*   < > 175*  --  184*  --  129*  --  273* 231*  --   BUN 54*   < > 71*   < > 57*  --  49*  --  42*  --  41* 38*  --   CREATININE 3.57*   < > 3.98*   < > 3.18*  --  2.75*  --  2.44*  --  2.47* 2.41*  --   CALCIUM 8.6*   < > 8.4*   < > 8.4*   < > 8.5*   < > 8.3*  --  8.4* 8.2*  --   MG 2.2  --  2.3  --  2.5*  --   --   --  2.6*  --   --  2.6*  --   PHOS 5.6*   < > 5.4*   < > 4.3  --  3.5  --  2.6  --  2.5 2.2*  --    < > = values in this interval not displayed.    Liver Function Tests: Recent Labs  Lab 03/22/20 1454 03/22/20 1454 03/23/20 0335 03/23/20 0335 03/23/20 1523 03/23/20 1523 03/24/20 0343 03/25/20 1532 03/26/20 0356 03/26/20 1536 03/27/20 0343 03/27/20 1541 03/28/20 0433  AST 90*  --  70*  --  64*  --  62*  --   --   --  38  --   --   ALT 27  --  24  --  24  --  26  --   --   --  25  --   --   ALKPHOS 67  --  78  --  74  --  72  --   --   --  94  --   --   BILITOT 2.4*  --  2.0*  --  2.2*  --  1.5*  --   --   --  1.3*  --   --   PROT 6.0*  --  5.9*  --  5.9*  --  6.1*  --   --   --  5.7*  --   --   ALBUMIN 3.2*   < > 3.0*   < > 2.9*   < >  3.0*   < > 2.4* 2.5* 2.6*  2.5* 2.7* 2.5*   < > = values in this interval not displayed.   No results for input(s): LIPASE, AMYLASE in the last 168 hours. Recent Labs  Lab 03/24/20 0719 03/27/20 1033  AMMONIA 32 35    CBC: Recent Labs  Lab 03/23/20 0335 03/23/20 0335 03/24/20 0343 03/24/20 0404 03/25/20 0303 03/25/20 0512 03/26/20 0356 03/26/20 0356 03/26/20 0358 03/26/20 1304 03/27/20 0343 03/27/20 0345 03/28/20 0441  WBC 17.8*  --  16.0*  --  11.1*  --  6.8  --   --   --  10.4  --   --   NEUTROABS 16.0*  --  14.1*  --  9.2*  --  5.5  --   --   --  8.9*  --   --   HGB 10.5*   < > 9.9*   < > 9.1*   < > 9.0*   < > 8.5* 9.9* 8.9* 9.2* 9.5*  HCT 31.7*   < > 30.0*   < > 27.8*   < > 26.9*   < > 25.0* 29.0* 27.2* 27.0* 28.0*  MCV 92.2  --  91.7  --  90.8  --  90.9  --   --   --  90.7  --   --  PLT 139*  --  158  --  143*  --  125*  --   --   --  139*  --   --    < > = values in this interval not displayed.    Cardiac Enzymes: No results for input(s): CKTOTAL, CKMB, CKMBINDEX, TROPONINI in the last 168 hours.  BNP: BNP (last 3 results) Recent Labs    01/04/20 1642 03/21/20 0209 03/26/20 2329  BNP 165.2* 315.9* 425.7*    ProBNP (last 3 results) No results for input(s): PROBNP in the last 8760 hours.    Other results:  Imaging: CT HEAD WO CONTRAST  Result Date: 03/27/2020 CLINICAL DATA:  Post LVAD.  Ataxia, agitated EXAM: CT HEAD WITHOUT CONTRAST TECHNIQUE: Contiguous axial images were obtained from the base of the skull through the vertex without intravenous contrast. COMPARISON:  01/04/2020 FINDINGS: Brain: Large area of infarct in the posterior temporal and parietal lobes on the right, new since prior study. No hemorrhage. No hydrocephalus. Vascular: No hyperdense vessel or unexpected calcification. Skull: No acute calvarial abnormality. Sinuses/Orbits: Visualized paranasal sinuses and mastoids clear. Orbital soft tissues unremarkable. Other: None IMPRESSION:  Large posterior right MCA infarct, new since prior study. Electronically Signed   By: Rolm Baptise M.D.   On: 03/27/2020 09:08   DG Chest Port 1 View  Result Date: 03/27/2020 CLINICAL DATA:  Chest tube.  Left ventricular assist device. EXAM: PORTABLE CHEST 1 VIEW COMPARISON:  03/26/2020 FINDINGS: Interim placement of feeding tube, its tip is below left hemidiaphragm. Interim removal Swan-Ganz catheter and mediastinal drainage catheters. Right IJ sheath in stable position. Right subclavian line stable position. Left chest tube in stable position. Cardiac pacer left ventricular assist device in stable position. Left atrial appendage clip again noted. Stable cardiomegaly. Unchanged bilateral interstitial prominence suggesting interstitial edema. Pneumonitis cannot be excluded. Tiny left pleural effusion cannot be excluded. No pneumothorax. IMPRESSION: 1. Interim placement of feeding tube, its tip is below left hemidiaphragm. Interim removal Swan-Ganz catheter and mediastinal drainage catheters. Right IJ sheath in stable position. Right subclavian line in stable position. Left chest tube in stable position. No pneumothorax. 2. Cardiac pacer left ventricular assist device in stable position. Stable cardiomegaly. 3. Unchanged bilateral interstitial prominence suggesting interstitial edema. Tiny left pleural effusion cannot be excluded. Electronically Signed   By: Marcello Moores  Register   On: 03/27/2020 07:26   DG Chest Port 1 View  Result Date: 03/26/2020 CLINICAL DATA:  Status post cardiac surgery. EXAM: PORTABLE CHEST 1 VIEW COMPARISON:  March 25, 2020. FINDINGS: Stable cardiomegaly. Left-sided pacemaker is unchanged in position. Stable position of left ventricular assist device. Right subclavian catheter is unchanged in position. Right internal jugular Swan-Ganz catheter is noted with tip in expected position of main pulmonary artery. Left-sided chest tube is noted without pneumothorax. Mild bilateral lung opacities  are noted concerning for edema. Probable small left pleural effusion and left basilar atelectasis is noted. Bony thorax is unremarkable. IMPRESSION: Stable support apparatus. No pneumothorax is noted. Mild bilateral lung opacities are noted concerning for edema. Probable small left pleural effusion and left basilar atelectasis is noted. Electronically Signed   By: Marijo Conception M.D.   On: 03/26/2020 09:47   DG Abd Portable 1V  Result Date: 03/26/2020 CLINICAL DATA:  Feeding tube. EXAM: PORTABLE ABDOMEN - 1 VIEW COMPARISON:  None. FINDINGS: Distal tip of feeding tube appears to be in proximal stomach. Visualized bowel loops are unremarkable. IMPRESSION: Distal tip of feeding tube appears to be in proximal stomach.  Electronically Signed   By: Marijo Conception M.D.   On: 03/26/2020 11:49     Medications:     Scheduled Medications: . sodium chloride   Intravenous Once  . aspirin  81 mg Per Tube Daily  . atorvastatin  80 mg Per Tube q1800  . B-complex with vitamin C  1 tablet Per Tube Daily  . bisacodyl  10 mg Oral Daily   Or  . bisacodyl  10 mg Rectal Daily  . chlorhexidine  15 mL Mouth Rinse BID  . Chlorhexidine Gluconate Cloth  6 each Topical Daily  . docusate sodium  200 mg Oral Daily  . feeding supplement (PRO-STAT SUGAR FREE 64)  30 mL Per Tube Daily  . influenza vaccine adjuvanted  0.5 mL Intramuscular Tomorrow-1000  . insulin aspart  0-24 Units Subcutaneous Q4H  . insulin aspart  3 Units Subcutaneous Q4H  . insulin glargine  10 Units Subcutaneous Daily  . mouth rinse  15 mL Mouth Rinse q12n4p  . melatonin  3 mg Oral QHS  . pantoprazole (PROTONIX) IV  40 mg Intravenous Daily  . sildenafil  20 mg Per Tube TID  . sodium chloride flush  10-40 mL Intracatheter Q12H  . sodium chloride flush  3 mL Intravenous Q12H  . Warfarin - Pharmacist Dosing Inpatient   Does not apply q1600    Infusions: .  prismasol BGK 4/2.5 500 mL/hr at 03/27/20 2300  .  prismasol BGK 4/2.5 200 mL/hr at  03/27/20 1400  . sodium chloride Stopped (03/22/20 2011)  . sodium chloride Stopped (03/27/20 1523)  . sodium chloride    . sodium chloride 20 mL/hr at 03/27/20 0500  . sodium chloride    . amiodarone 30 mg/hr (03/28/20 0704)  . ceFEPime (MAXIPIME) IV 2 g (03/27/20 2119)  . dexmedetomidine (PRECEDEX) IV infusion Stopped (03/28/20 0500)  . feeding supplement (VITAL 1.5 CAL) 60 mL/hr at 03/28/20 0700  . lactated ringers    . lactated ringers 10 mL/hr at 03/28/20 0700  . milrinone 0.375 mcg/kg/min (03/28/20 0700)  . norepinephrine (LEVOPHED) Adult infusion Stopped (03/21/20 1049)  . prismasol BGK 4/2.5 1,500 mL/hr at 03/28/20 0509  . vancomycin Stopped (03/27/20 1505)    PRN Medications: sodium chloride, Place/Maintain arterial line **AND** sodium chloride, acetaminophen, dextrose, fentaNYL (SUBLIMAZE) injection, heparin, heparin, hydrALAZINE, HYDROcodone-acetaminophen, lactated ringers, levalbuterol, LORazepam, ondansetron (ZOFRAN) IV, ondansetron (ZOFRAN) IV, oxyCODONE, sodium chloride, sodium chloride flush, sodium chloride flush, sorbitol, traMADol   Assessment/Plan:   1. Acute on chronic systolic HF -> cardiogenic shock-> S/p HM3 LVAD - Echo 2016 EF 30-35% - Echo 1/21 EF 25% - Admitted with NYHA IV symptoms and AKI with attempts at diuresis.  - Echo this admit: EF 10-15% moderate RV dysfunction - R/LHC cath 3/21 with severe 3v CAD and low output with CI 1.7.  - Swan placed. Initial co-ox 39%. Initial PAPi 1.5. Started on milrinone 0.25. - Initially planned for CABG but given need for re-do sternotomy, relatively poor targets and longstanding low EF, VAD felt to be better option  - S/p teeth extractions by Dr. Enrique Sack. - S/p HM3 VAD 4/8 - Currently on milrinone 0.375. - Speed turned up on 4/11to  5400, but looking at 4/10 echo there is significant RV dysfunction with leftward septal shift so speed decreased back to 5300 rpm.  - Continue sildenafil 20 mg tid  -  Nephrology  consulted for CVVHD.  - LDH 454  - Last dose of coumadin was 4/11. Received IV vitamin K 4/13 and  4/15, INR 2.2 today.  No further vitamin K for now.  - Continue ASA to 81 daily.   - Co-ox 60%, CVP 13-14.  Ongoing CVVH with goal UF net 150 cc/hr currently. CXR today.  - Continue milrinone 0.375.   2. CAD with unstable angina - s/p previous PCI - cath 03/11/2020 with severe 3v CAD - now s/p VAD on 03/15/2020    3. AKI on CKD 3a - Nephrology consulted. Started CVVHD 4/13  - May reflect RV failure.  Renal US 4/5 unremarkable.  - As above, ongoing volume overload continue CVVH goal UF 150 cc/hr net today.   4. Permanent AF - now s/p MAZE + LAA Clipping 4/8   - continue IV amiodarone, 30 mg/hr. Rate stable.  - INR has been supretherapeutic so coumadin on hold   5. 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 DM2, poorly controlled - hgbA1c 11.1% - continue insulin and SSI   7. ID - WBC down to 6.8   - 4/11 procalcitonin 1.96  - Started empiric coverage for PNA with vancomycin/cefepime for 7 days.  - Urine culture obtained 03/24/20- no growth.   8. Acute Hypoxic Respiratory Failure - Off Bipap now, on nasal cannula.  CXR today.   9. Malnutrition  - Albumin 2.9   - On tube feeds.    10. CVA - 03/27/20 CT large posterior MCA infarct, possibly from atrial fibrillation.  Stable LDH, do not think partial pump thrombosis is the culprit. - MAP stable in 80s.   - Watching for hemorrhagic conversion, INR now 2.2.  Will try to keep in therapeutic range.  No more vitamin K today but follow closely.  - Neurology following.   11. Coagulopathy - Seen by hematology, think due to cirrhosis and malabsorption of vitamin K.   CRITICAL CARE Performed by: Loralie Champagne  Total critical care time: 40 minutes  Critical care time was exclusive of separately billable procedures and treating other patients.  Critical care was  necessary to treat or prevent imminent or life-threatening deterioration.  Critical care was time spent personally by me on the following activities: development of treatment plan with patient and/or surrogate as well as nursing, discussions with consultants, evaluation of patient's response to treatment, examination of patient, obtaining history from patient or surrogate, ordering and performing treatments and interventions, ordering and review of laboratory studies, ordering and review of radiographic studies, pulse oximetry and re-evaluation of patient's condition.    Length of Stay: [place]  03/28/2020, 7:56 AM  Advanced Heart Failure Team Pager [phone] (M-F; 7a - 4p)  Please contact [facility] for night-coverage after hours (4p -7a ) and weekends on [email or web address]

## 2020-03-28 NOTE — Progress Notes (Signed)
CSW met with patient at bedside. Patient was in bed on dialysis and responding to CSW. Patient intermittently responded appropriately to questions asked. Patient appears to be confused although did comment about wanting to leave the hospital.  CSW continues to follow for supportive intervention throughout implant hospitalization. [name], [place], [place]

## 2020-03-28 NOTE — Progress Notes (Signed)
LVAD Coordinator Rounding Note:  HM III LVAD implanted on 04/11/2020 by Dr Orvan Seen under destination therapy criteria.  Pt lying in bed able to follow simple commands today.   Vital signs: Temp: 96.3 HR: 103 afib Doppler Pressure: 88 Art line: 93/76 (82)  Automatic BP: 84/74 (79) O2 Sat: 95% on 10 HFNC Wt: 253.3>262.3>260.8>259.9>237.8 lbs   LVAD interrogation reveals:  Speed: 5300 Flow: 4.1 Power: 3.8 PI: 5.1  Alarms: none Events:  none Hematocrit: 30  Fixed speed: 5300 Low speed limit: 5000   Drive Line: CDI. Every other day dressing changes per nurse champion or VAD coordinator. Dressing change due 03/29/20.    Labs:  LDH trend: 360>513>482>467>453>454  INR trend: 1.3>6.2>9>4.4>6.2>2.2  CR: 3.57>3.98>3.18>2.44>2.41  Anticoagulation Plan: -INR Goal: 2.0-2.5 -ASA Dose: 81 mg   Blood Products:  Intraop: 6 FFP  DDAVP  420 cell saver  04/11/2020>>4 FFP 03/21/20>> 1 RBC 03/22/20>>1 RBC  Device: -St [name] -Therapies: VF 194  Drips: Amiodarone 30 mg/hr Epinephrine 1 mcg/min-off 03/25/20 Lasix 15 mg/hr- off 03/25/20 Insulin 2.8 U/HR- off Milrinone 0.375 mcg/kg/min Levophed-- off   Adverse Events on VAD: >>03/25/20 CVVH Started  Pt Education:  1. Education inappropriate at this time, pt is able to follow simple commands only. 2. Daughter-[name] observed driveline dressing change yesterday. [name] will practice donning/doffing sterile gloves. We will follow up with her Monday.  Plan/Recommendations:  1. Call VAD coordinator for any equipment or drive line issues.  2. Every other day drive line dressing change per nurse champion or VAD coordinator.   Tanda Rockers RN [place] Coordinator  Office: [phone]  24/7 Pager: [phone]

## 2020-03-28 NOTE — Progress Notes (Addendum)
STROKE TEAM PROGRESS NOTE   INTERVAL HISTORY Chart reviewed. Complex hospital stay since 4/8 when he was admitted for insertion of LVAD. CTH was done when pt continued to be agitated. Neurology was consulted when this showed subacute  large posterior RMCA infarct. Pt is not able to provide much history at this time.   Vitals:   03/28/20 0500 03/28/20 0600 03/28/20 0700 03/28/20 0715  BP:  104/84 (!) 127/99   Pulse: (!) 109 (!) 112 89 97  Resp: 13 12 14 14   Temp:      TempSrc:      SpO2: 97% 96% 96% 98%  Weight:      Height:        CBC:  Recent Labs  Lab 03/26/20 0356 03/26/20 0358 03/27/20 0343 03/27/20 0343 03/27/20 0345 03/28/20 0441  WBC 6.8  --  10.4  --   --   --   NEUTROABS 5.5  --  8.9*  --   --   --   HGB 9.0*   < > 8.9*   < > 9.2* 9.5*  HCT 26.9*   < > 27.2*   < > 27.0* 28.0*  MCV 90.9  --  90.7  --   --   --   PLT 125*  --  139*  --   --   --    < > = values in this interval not displayed.    Basic Metabolic Panel:  Recent Labs  Lab 03/27/20 0343 03/27/20 0345 03/27/20 1541 03/27/20 1541 03/28/20 0433 03/28/20 0441  NA 138   < > 138   < > 136 137  K 3.5   < > 4.3   < > 4.1 4.2  CL 103   < > 102  --  102  --   CO2 24   < > 22  --  24  --   GLUCOSE 129*   < > 273*  --  231*  --   BUN 42*   < > 41*  --  38*  --   CREATININE 2.44*   < > 2.47*  --  2.41*  --   CALCIUM 8.3*   < > 8.4*  --  8.2*  --   MG 2.6*  --   --   --  2.6*  --   PHOS 2.6   < > 2.5  --  2.2*  --    < > = values in this interval not displayed.   Lipid Panel:     Component Value Date/Time   CHOL 89 04/03/2020 1312   TRIG 69 03/25/2020 1312   HDL 33 (L) 04/11/2020 1312   CHOLHDL 2.7 03/13/2020 1312   VLDL 14 04/07/2020 1312   LDLCALC 42 03/15/2020 1312   HgbA1c:  Lab Results  Component Value Date   HGBA1C 10.5 (H) 03/20/2020   Urine Drug Screen: No results found for: LABOPIA, COCAINSCRNUR, LABBENZ, AMPHETMU, THCU, LABBARB  Alcohol Level No results found for: ETH  IMAGING  past 24 hours CT HEAD WO CONTRAST  Result Date: 03/27/2020 CLINICAL DATA:  Post LVAD.  Ataxia, agitated EXAM: CT HEAD WITHOUT CONTRAST TECHNIQUE: Contiguous axial images were obtained from the base of the skull through the vertex without intravenous contrast. COMPARISON:  01/04/2020 FINDINGS: Brain: Large area of infarct in the posterior temporal and parietal lobes on the right, new since prior study. No hemorrhage. No hydrocephalus. Vascular: No hyperdense vessel or unexpected calcification. Skull: No acute calvarial abnormality. Sinuses/Orbits: Visualized paranasal  sinuses and mastoids clear. Orbital soft tissues unremarkable. Other: None IMPRESSION: Large posterior right MCA infarct, new since prior study. Electronically Signed   By: Rolm Baptise M.D.   On: 03/27/2020 09:08    PHYSICAL EXAM Constitutional: Elderly Caucasian male appears well-developed and well-nourished. Moderate distress; critically ill Eyes: No scleral injection HENT: No OP obstrucion Head: Normocephalic.  Cardiovascular: Irregular irregular rhythm Respiratory: Effort normal, non-labored breathing GI: Soft. No distension. There is no tenderness.  Skin: WDI  Neuro: Mental Status: Resting comfortably until exam was attempted, and he became agitated with exam and questions. He was able to state his name, dob, but unable to give much more info than that d/t moaning and agitation. What little speech he did have was dysarthric and nonfluent.. Unable to name or participate with further aphasia testing. Did not attend well. Able to follow simple commands, sometimes with repeat commands.  Cranial Nerves: II: No blink to threat on the left but present on the right III,IV, VI: Not able to hold attention with eyes open long enough to fully test EOM. Pupils equal, round and reactive to light V: Winces to pain VII: Left facial droop VIII: hearing is intact to voice Motor: He was able to grip and move both legs. He appears to move  his right arm more than his left, wiggles toes bilaterally to command w/brief antigravity seen on both sides; Exam limited d/t agitation and poor attention. Sensory: Winces to pain on the right upper and lower extremities.  Appears to wince from pain on the left upper but not lower extremity. Deep Tendon Reflexes: I cannot elicit any ankle or knee jerk but he does have 2+ at the brachioradialis Plantars: Toes are downgoing bilaterally.  Cerebellar: Unable to assess  ASSESSMENT/PLAN [name] is a 69 y.o. male who was admitted for cardiac surgery. Post op course has been complicated by shock and agitation. Neurology consulted when [facility] showed RMCA infarct.    Stroke: RMCA cardioembolic stroke in setting of Afib, coagulopathy and acute HF s/p LVAD, MAZE and LAA clipping.  CT head: Right MCA, posterior division.  2D Echo 20% EF, global hypokinesis with mod LVH, aortic dilation, severe reduced RV syst function.   LDL 42  HgbA1c 10.5  Fully anticoagulation needed for LVAD; no VTE prophylaxis needed    Diet   Diet NPO time specified     Eliquis prior to admission, now on ASA and anticoagulation per cardiology and CV/HF teams d/t LVAD. He is at risk for hemorrhagic conversion of stroke, but the need for currently anticoagulation in setting of LVAD takes precedence over the risk at this time.   Therapy recommendations:  pending  Disposition:  pending  Hypertension  Home meds: Toprol, tamsulon, Amioderone, nitro  Stable . No role in permissive at this time . Long-term BP goal normotensive  Hyperlipidemia  Home meds:  Lipitor 40mg , resumed in hospital  LDL 42, goal < 70  Continue statin at discharge  Diabetes type II Uncontrolled  Home meds:  Novolog, kwikpen  HgbA1c 10.5, goal < 7.0  CBGs Recent Labs    03/27/20 2347 03/28/20 0440 03/28/20 0743  GLUCAP 166* 202* 215*      SSI  Other Stroke Risk Factors  Advanced age  Other Active  Problems  Acute on chronic systolic Heart Failure  Cardiogenic shock  4/8 Post op LVAD, MAZE, LAA clipping  AKI on - CVVHD  CAD w/unstable angina- s/p PCI  Acute hypoxic respiratory failure- off Bipap now  Malnutrition  Coagulopathy- seen by hematology; likely d/t cirrhosis and malabsorption of K  Afib  Hospital day # [id number] Metzger[place], ARNP-C, ANVP-BC Pager: [phone]  I have personally obtained history,examined this patient, reviewed notes, independently viewed imaging studies, participated in medical decision making and plan of care.ROS completed by me personally and pertinent positives fully documented  I have made any additions or clarifications directly to the above note. Agree with note above.  Patient has had a recent heart surgery including VAT procedure is as well as maze and left atrial appendage clipping.  He developed postop agitation and CT scan shows subacute looking right MCA infarct which is likely periprocedural and cardioembolic etiology.  Continue anticoagulation due to his LVAD with target INR goal between 2 and 3.  Check speech therapy for swallow eval and when medically stable mobilize out of bed with physical occupational therapy and suspect he will need rehab.  Aggressive risk factor modification.  Family not available at the bedside for discussion.  Greater than 50% time during this 35-minute visit was spent on counseling and coordination of care and discussion with care team.   Antony Contras, MD Medical Director [place] Pager: [phone] 03/28/2020 6:25 PM   To contact Stroke Continuity provider, please refer to [email or web address] After hours, contact General Neurology

## 2020-03-28 NOTE — Progress Notes (Signed)
Inpatient Diabetes Program Recommendations  AACE/ADA: New Consensus Statement on Inpatient Glycemic Control (2015)  Target Ranges:  Prepandial:   less than 140 mg/dL      Peak postprandial:   less than 180 mg/dL (1-2 hours)      Critically ill patients:  140 - 180 mg/dL   Lab Results  Component Value Date   GLUCAP 225 (H) 03/28/2020   HGBA1C 10.5 (H) 03/15/2020    Review of Glycemic Control  Diabetes history: DM2 Outpatient Diabetes medications: Basaglar 50 units QD, Novolog 6 units tidwc Current orders for Inpatient glycemic control: Lantus 10 units QD, Novolog 0-24 units Q4H + 3 units Q4H  On Vital 1.5 TF @ 60/H HgbA1C - 10.5% - needs tighter control FBS past 2 days - < 180 mg/dL  Inpatient Diabetes Program Recommendations:   If FBS > 180 mg/dL, increase Lantus to 15 units QD. Agree with Novolog 3 units Q4H for TF coverage. HOLD if TF is d/ced or held for any reason. Decrease Novolog to 0-15 units Q4H, given pt is renal. If blood sugars begin to rise, increase Novolog to 4 units Q4H for TF coverage.   Will continue to follow closely.  Thank you. Lorenda Peck, RD, LDN, CDE Inpatient Diabetes Coordinator [phone]

## 2020-03-28 NOTE — Social Work (Signed)
CSW met with pt at bedside. CSW introduced self and explained her role. CSW completed sbirt with pt.  Pt scored a 0 on the sbirt scale. Pt denied alcohol use. Pt denied substance use. Pt did not need resources at this time.  Pierra Skora, LCSWA, LCASA Clinical Social Worker [phone]   

## 2020-03-28 NOTE — Procedures (Signed)
Admit: 03/06/2020 LOS: 59  43M dialysis dependent AKI on CRRT, acute CHF exacerbation s/p VAD, status post MV repair, AFib s/p MAZE and LAA clipping 4/8; R MCA CVA 4/15  Current CRRT Prescription: Start Date: 4/13 Catheter: L [place] Temp HD cath BFR: 200 Pre Blood Pump: 500 4K DFR: 1500 4K Replacement Rate: 200 4K Goal UF: 129mL/h net neg Anticoagulation: none Clotting: acceptable   S:  Stable, tolerating UF  K 4.2, P 2.2  O: 04/15 0701 - 04/16 0700 In: 3040.1 [I.V.:989.8; NG/GT:1632.5; IV Piggyback:417.9] Out: 5838 [Chest Tube:30]  Filed Weights   03/26/20 0424 03/27/20 0500 03/28/20 0400  Weight: 117.9 kg 112.4 kg 107.9 kg    Recent Labs  Lab 03/27/20 0343 03/27/20 0345 03/27/20 1541 03/28/20 0433 03/28/20 0441  NA 138   < > 138 136 137  K 3.5   < > 4.3 4.1 4.2  CL 103  --  102 102  --   CO2 24  --  22 24  --   GLUCOSE 129*  --  273* 231*  --   BUN 42*  --  41* 38*  --   CREATININE 2.44*  --  2.47* 2.41*  --   CALCIUM 8.3*  --  8.4* 8.2*  --   PHOS 2.6  --  2.5 2.2*  --    < > = values in this interval not displayed.   Recent Labs  Lab 03/25/20 0303 03/25/20 0512 03/26/20 0356 03/26/20 0358 03/27/20 0343 03/27/20 0345 03/28/20 0441  WBC 11.1*  --  6.8  --  10.4  --   --   NEUTROABS 9.2*  --  5.5  --  8.9*  --   --   HGB 9.1*   < > 9.0*   < > 8.9* 9.2* 9.5*  HCT 27.8*   < > 26.9*   < > 27.2* 27.0* 28.0*  MCV 90.8  --  90.9  --  90.7  --   --   PLT 143*  --  125*  --  139*  --   --    < > = values in this interval not displayed.    Scheduled Meds: . sodium chloride   Intravenous Once  . aspirin  81 mg Per Tube Daily  . atorvastatin  80 mg Per Tube q1800  . B-complex with vitamin C  1 tablet Per Tube Daily  . bisacodyl  10 mg Oral Daily   Or  . bisacodyl  10 mg Rectal Daily  . chlorhexidine  15 mL Mouth Rinse BID  . Chlorhexidine Gluconate Cloth  6 each Topical Daily  . docusate sodium  200 mg Oral Daily  . feeding supplement (PRO-STAT SUGAR  FREE 64)  30 mL Per Tube Daily  . influenza vaccine adjuvanted  0.5 mL Intramuscular Tomorrow-1000  . insulin aspart  0-24 Units Subcutaneous Q4H  . insulin aspart  3 Units Subcutaneous Q4H  . insulin glargine  10 Units Subcutaneous Daily  . mouth rinse  15 mL Mouth Rinse q12n4p  . melatonin  3 mg Oral QHS  . pantoprazole (PROTONIX) IV  40 mg Intravenous Daily  . sildenafil  20 mg Per Tube TID  . sodium chloride flush  10-40 mL Intracatheter Q12H  . sodium chloride flush  3 mL Intravenous Q12H  . Warfarin - Pharmacist Dosing Inpatient   Does not apply q1600   Continuous Infusions: .  prismasol BGK 4/2.5 500 mL/hr at 03/27/20 2300  .  prismasol BGK 4/2.5  200 mL/hr at 03/27/20 1400  . sodium chloride Stopped (03/22/20 2011)  . sodium chloride Stopped (03/27/20 1523)  . sodium chloride    . sodium chloride 20 mL/hr at 03/27/20 0500  . sodium chloride    . amiodarone 30 mg/hr (03/28/20 0800)  . ceFEPime (MAXIPIME) IV 2 g (03/27/20 2119)  . dexmedetomidine (PRECEDEX) IV infusion Stopped (03/28/20 0500)  . feeding supplement (VITAL 1.5 CAL) 60 mL/hr at 03/28/20 0700  . lactated ringers    . lactated ringers 10 mL/hr at 03/28/20 0800  . milrinone 0.375 mcg/kg/min (03/28/20 0800)  . norepinephrine (LEVOPHED) Adult infusion Stopped (03/21/20 1049)  . prismasol BGK 4/2.5 1,500 mL/hr at 03/28/20 0509  . vancomycin Stopped (03/27/20 1505)   PRN Meds:.sodium chloride, Place/Maintain arterial line **AND** sodium chloride, acetaminophen, dextrose, fentaNYL (SUBLIMAZE) injection, heparin, heparin, hydrALAZINE, HYDROcodone-acetaminophen, lactated ringers, levalbuterol, LORazepam, ondansetron (ZOFRAN) IV, ondansetron (ZOFRAN) IV, oxyCODONE, sodium chloride, sodium chloride flush, sodium chloride flush, sorbitol, traMADol  ABG    Component Value Date/Time   PHART 7.436 03/28/2020 0441   PCO2ART 38.4 03/28/2020 0441   PO2ART 115.0 (H) 03/28/2020 0441   HCO3 26.1 03/28/2020 0441   TCO2 27  03/28/2020 0441   ACIDBASEDEF 2.0 03/26/2020 0358   O2SAT 99.0 03/28/2020 0441    A/P  1. 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 Hypophosphatemia, replete this AM 3. sCHF s/p VAD 4/8 4. AFib s/p MAZE and LAA clipping 4/8 5. DM2 6. R MCA ischemic CVA  Stable on CRRT, tolerating UF. Rplete phos this AM.    [name] Grippe, MD [facility]

## 2020-03-28 NOTE — Progress Notes (Signed)
Physical Therapy Treatment Patient Details Name: [name] [name] MRN: [id number] DOB: [birth year] Today's Date: 03/28/2020    History of Present Illness 69 yo male with history of CAD with prior stenting of the LAD, Diagonal and OM in an outside hospital, ischemic cardiomyopathy with ICD in place, chronic systolic CHF, MVP s/p mitral valve repair, permanent atrial fib on chronic anticoagulation, HTN, HLD, DM, depression admitted to [facility] with progressive weakness, fatigue, dyspnea and chest pressure. Troponin elevated at 0.06. He was transferred to [facility] for cardiac cath, showing severe 3 vessel disease. Plan for CABG but pt with AKI. Pt underwent multiple tooth extraction on 4/6 and LVAD placement on 4/8. Postop day 7 patient was noted to be agitated the CT head was obtained showing a large posterior right MCA infarct     PT Comments    Pt more awake and interactive. Pushing hard with RUE in sitting. Fatigues quickly. Continue to recommend CIR.    Follow Up Recommendations  CIR;Supervision/Assistance - 24 hour     Equipment Recommendations  Other (comment)(To be determined)    Recommendations for Other Services       Precautions / Restrictions Precautions Precautions: Fall Precaution Comments: LVAD, sternal    Mobility  Bed Mobility Overal bed mobility: Needs Assistance Bed Mobility: Rolling;Supine to Sit;Sit to Supine Rolling: Mod assist;+2 for safety/equipment   Supine to sit: +2 for physical assistance;Mod assist;HOB elevated(+3 for lines) Sit to supine: +2 for physical assistance;Max assist(+3 for lines)   General bed mobility comments: Assist to elevate trunk into sitting and bring hips to EOB. Assist to lower trunk and bring legs back up into bed  Transfers                 General transfer comment: Pt fatigued and unable to attempt  Ambulation/Gait                 Stairs             Wheelchair Mobility    Modified Rankin  (Stroke Patients Only)       Balance Overall balance assessment: Needs assistance Sitting-balance support: Bilateral upper extremity supported;Feet supported Sitting balance-Leahy Scale: Poor Sitting balance - Comments: Pt sat EOB x 10 minutes with min to mod assist. Pt pushing to the lt with his RUE. Had pt prop on rt forearm to reduce pushing Postural control: Left lateral lean                                  Cognition Arousal/Alertness: Lethargic Behavior During Therapy: Restless Overall Cognitive Status: Impaired/Different from baseline Area of Impairment: Orientation;Attention;Memory;Following commands;Safety/judgement;Awareness;Problem solving                 Orientation Level: Disoriented to;Time Current Attention Level: Focused Memory: Decreased recall of precautions;Decreased short-term memory Following Commands: Follows one step commands inconsistently Safety/Judgement: Decreased awareness of safety;Decreased awareness of deficits Awareness: Intellectual Problem Solving: Slow processing;Decreased initiation;Difficulty sequencing;Requires verbal cues;Requires tactile cues        Exercises      General Comments General comments (skin integrity, edema, etc.): VSS      Pertinent Vitals/Pain Pain Assessment: Faces Faces Pain Scale: No hurt    Home Living                      Prior Function            PT Goals (current  goals can now be found in the care plan section) Acute Rehab PT Goals Patient Stated Goal: Pt wants something to drink Progress towards PT goals: Progressing toward goals    Frequency    Min 3X/week      PT Plan Current plan remains appropriate    Co-evaluation              AM-PAC PT "6 Clicks" Mobility   Outcome Measure  Help needed turning from your back to your side while in a flat bed without using bedrails?: A Lot Help needed moving from lying on your back to sitting on the side of a flat  bed without using bedrails?: A Lot Help needed moving to and from a bed to a chair (including a wheelchair)?: Total Help needed standing up from a chair using your arms (e.g., wheelchair or bedside chair)?: Total Help needed to walk in hospital room?: Total Help needed climbing 3-5 steps with a railing? : Total 6 Click Score: 8    End of Session Equipment Utilized During Treatment: Oxygen Activity Tolerance: Patient limited by fatigue Patient left: in bed;with call bell/phone within reach;with nursing/sitter in room Nurse Communication: Mobility status(nurse assisted with all mobility) PT Visit Diagnosis: Unsteadiness on feet (R26.81);Muscle weakness (generalized) (M62.81);Difficulty in walking, not elsewhere classified (R26.2);Other symptoms and signs involving the nervous system (Y19.509)     Time: 3267-1245 PT Time Calculation (min) (ACUTE ONLY): 24 min  Charges:  $Therapeutic Activity: 23-37 mins                     [place] Pager [phone] Office [place] 03/28/2020, 12:55 PM

## 2020-03-29 ENCOUNTER — Inpatient Hospital Stay (HOSPITAL_COMMUNITY): Payer: Medicare HMO

## 2020-03-29 DIAGNOSIS — R570 Cardiogenic shock: Secondary | ICD-10-CM | POA: Diagnosis not present

## 2020-03-29 DIAGNOSIS — I63411 Cerebral infarction due to embolism of right middle cerebral artery: Secondary | ICD-10-CM

## 2020-03-29 DIAGNOSIS — I5022 Chronic systolic (congestive) heart failure: Secondary | ICD-10-CM

## 2020-03-29 DIAGNOSIS — I4811 Longstanding persistent atrial fibrillation: Secondary | ICD-10-CM | POA: Diagnosis not present

## 2020-03-29 DIAGNOSIS — Z992 Dependence on renal dialysis: Secondary | ICD-10-CM

## 2020-03-29 DIAGNOSIS — Z9889 Other specified postprocedural states: Secondary | ICD-10-CM

## 2020-03-29 DIAGNOSIS — I255 Ischemic cardiomyopathy: Secondary | ICD-10-CM

## 2020-03-29 DIAGNOSIS — N179 Acute kidney failure, unspecified: Secondary | ICD-10-CM | POA: Diagnosis not present

## 2020-03-29 LAB — RENAL FUNCTION PANEL
Albumin: 2.6 g/dL — ABNORMAL LOW (ref 3.5–5.0)
Albumin: 2.4 g/dL — ABNORMAL LOW (ref 3.5–5.0)
Anion gap: 10 (ref 5–15)
Anion gap: 10 (ref 5–15)
BUN: 31 mg/dL — ABNORMAL HIGH (ref 8–23)
BUN: 33 mg/dL — ABNORMAL HIGH (ref 8–23)
CO2: 24 mmol/L (ref 22–32)
CO2: 23 mmol/L (ref 22–32)
Calcium: 8.4 mg/dL — ABNORMAL LOW (ref 8.9–10.3)
Calcium: 8.2 mg/dL — ABNORMAL LOW (ref 8.9–10.3)
Chloride: 102 mmol/L (ref 98–111)
Chloride: 101 mmol/L (ref 98–111)
Creatinine, Ser: 2.1 mg/dL — ABNORMAL HIGH (ref 0.61–1.24)
Creatinine, Ser: 2.02 mg/dL — ABNORMAL HIGH (ref 0.61–1.24)
GFR calc Af Amer: 36 mL/min — ABNORMAL LOW (ref >60)
GFR calc Af Amer: 38 mL/min — ABNORMAL LOW (ref >60)
GFR calc non Af Amer: 31 mL/min — ABNORMAL LOW (ref >60)
GFR calc non Af Amer: 32 mL/min — ABNORMAL LOW (ref >60)
Glucose, Bld: 206 mg/dL — ABNORMAL HIGH (ref 70–99)
Glucose, Bld: 320 mg/dL — ABNORMAL HIGH (ref 70–99)
Phosphorus: 2.4 mg/dL — ABNORMAL LOW (ref 2.5–4.6)
Phosphorus: 3.2 mg/dL (ref 2.5–4.6)
Potassium: 4.4 mmol/L (ref 3.5–5.1)
Potassium: 4.6 mmol/L (ref 3.5–5.1)
Sodium: 136 mmol/L (ref 135–145)
Sodium: 134 mmol/L — ABNORMAL LOW (ref 135–145)

## 2020-03-29 LAB — HEPATIC FUNCTION PANEL
ALT: 25 U/L (ref 0–44)
AST: 31 U/L (ref 15–41)
Albumin: 2.5 g/dL — ABNORMAL LOW (ref 3.5–5.0)
Alkaline Phosphatase: 124 U/L (ref 38–126)
Bilirubin, Direct: 0.4 mg/dL — ABNORMAL HIGH (ref 0.0–0.2)
Indirect Bilirubin: 0.9 mg/dL (ref 0.3–0.9)
Total Bilirubin: 1.3 mg/dL — ABNORMAL HIGH (ref 0.3–1.2)
Total Protein: 6.2 g/dL — ABNORMAL LOW (ref 6.5–8.1)

## 2020-03-29 LAB — CBC WITH DIFFERENTIAL/PLATELET
Abs Immature Granulocytes: 0.71 10*3/uL — ABNORMAL HIGH (ref 0.00–0.07)
Basophils Absolute: 0.1 10*3/uL (ref 0.0–0.1)
Basophils Relative: 0 %
Eosinophils Absolute: 0 10*3/uL (ref 0.0–0.5)
Eosinophils Relative: 0 %
HCT: 31.5 % — ABNORMAL LOW (ref 39.0–52.0)
Hemoglobin: 10.2 g/dL — ABNORMAL LOW (ref 13.0–17.0)
Immature Granulocytes: 5 %
Lymphocytes Relative: 2 %
Lymphs Abs: 0.3 10*3/uL — ABNORMAL LOW (ref 0.7–4.0)
MCH: 29.9 pg (ref 26.0–34.0)
MCHC: 32.4 g/dL (ref 30.0–36.0)
MCV: 92.4 fL (ref 80.0–100.0)
Monocytes Absolute: 0.9 10*3/uL (ref 0.1–1.0)
Monocytes Relative: 7 %
Neutro Abs: 11.8 10*3/uL — ABNORMAL HIGH (ref 1.7–7.7)
Neutrophils Relative %: 86 %
Platelets: 152 10*3/uL (ref 150–400)
RBC: 3.41 MIL/uL — ABNORMAL LOW (ref 4.22–5.81)
RDW: 14.8 % (ref 11.5–15.5)
WBC: 13.8 10*3/uL — ABNORMAL HIGH (ref 4.0–10.5)
nRBC: 0.9 % — ABNORMAL HIGH (ref 0.0–0.2)

## 2020-03-29 LAB — COOXEMETRY PANEL
Carboxyhemoglobin: 1 % (ref 0.5–1.5)
Methemoglobin: 0.5 % (ref 0.0–1.5)
O2 Saturation: 56.6 %
Total hemoglobin: 10.9 g/dL — ABNORMAL LOW (ref 12.0–16.0)

## 2020-03-29 LAB — POCT I-STAT 7, (LYTES, BLD GAS, ICA,H+H)
Acid-Base Excess: 2 mmol/L (ref 0.0–2.0)
Bicarbonate: 25.5 mmol/L (ref 20.0–28.0)
Calcium, Ion: 1.18 mmol/L (ref 1.15–1.40)
HCT: 31 % — ABNORMAL LOW (ref 39.0–52.0)
Hemoglobin: 10.5 g/dL — ABNORMAL LOW (ref 13.0–17.0)
O2 Saturation: 99 %
Patient temperature: 95
Potassium: 4.4 mmol/L (ref 3.5–5.1)
Sodium: 137 mmol/L (ref 135–145)
TCO2: 27 mmol/L (ref 22–32)
pCO2 arterial: 33.7 mmHg (ref 32.0–48.0)
pH, Arterial: 7.478 — ABNORMAL HIGH (ref 7.350–7.450)
pO2, Arterial: 103 mmHg (ref 83.0–108.0)

## 2020-03-29 LAB — GLUCOSE, CAPILLARY
Glucose-Capillary: 180 mg/dL — ABNORMAL HIGH (ref 70–99)
Glucose-Capillary: 197 mg/dL — ABNORMAL HIGH (ref 70–99)
Glucose-Capillary: 207 mg/dL — ABNORMAL HIGH (ref 70–99)
Glucose-Capillary: 226 mg/dL — ABNORMAL HIGH (ref 70–99)
Glucose-Capillary: 245 mg/dL — ABNORMAL HIGH (ref 70–99)
Glucose-Capillary: 289 mg/dL — ABNORMAL HIGH (ref 70–99)

## 2020-03-29 LAB — PROTIME-INR
INR: 2.4 — ABNORMAL HIGH (ref 0.8–1.2)
Prothrombin Time: 26 seconds — ABNORMAL HIGH (ref 11.4–15.2)

## 2020-03-29 LAB — LACTATE DEHYDROGENASE: LDH: 474 U/L — ABNORMAL HIGH (ref 98–192)

## 2020-03-29 LAB — MAGNESIUM: Magnesium: 2.6 mg/dL — ABNORMAL HIGH (ref 1.7–2.4)

## 2020-03-29 MED ORDER — SODIUM PHOSPHATES 45 MMOLE/15ML IV SOLN
30.0000 mmol | Freq: Once | INTRAVENOUS | Status: AC
  Administered 2020-03-29: 08:00:00 30 mmol via INTRAVENOUS
  Filled 2020-03-29: qty 10

## 2020-03-29 MED ORDER — AMIODARONE HCL 200 MG PO TABS
200.0000 mg | ORAL_TABLET | Freq: Two times a day (BID) | ORAL | Status: DC
  Administered 2020-03-29 – 2020-04-01 (×7): 200 mg
  Filled 2020-03-29 (×8): qty 1

## 2020-03-29 MED ORDER — DEXMEDETOMIDINE HCL IN NACL 400 MCG/100ML IV SOLN
0.2000 ug/kg/h | INTRAVENOUS | Status: DC
  Administered 2020-03-29: 0.4 ug/kg/h via INTRAVENOUS
  Administered 2020-03-30: 0.6 ug/kg/h via INTRAVENOUS
  Administered 2020-03-30: 0.2 ug/kg/h via INTRAVENOUS
  Filled 2020-03-29 (×4): qty 100

## 2020-03-29 MED ORDER — WARFARIN 0.5 MG HALF TABLET
0.5000 mg | ORAL_TABLET | Freq: Once | ORAL | Status: AC
  Administered 2020-03-29: 17:00:00 0.5 mg via ORAL
  Filled 2020-03-29: qty 1

## 2020-03-29 MED ORDER — INSULIN GLARGINE 100 UNIT/ML ~~LOC~~ SOLN
18.0000 [IU] | Freq: Two times a day (BID) | SUBCUTANEOUS | Status: DC
  Administered 2020-03-29 – 2020-03-30 (×2): 18 [IU] via SUBCUTANEOUS
  Filled 2020-03-29 (×3): qty 0.18

## 2020-03-29 MED ORDER — INSULIN GLARGINE 100 UNIT/ML ~~LOC~~ SOLN
10.0000 [IU] | Freq: Two times a day (BID) | SUBCUTANEOUS | Status: DC
  Filled 2020-03-29: qty 0.1

## 2020-03-29 NOTE — Progress Notes (Signed)
LVAD Dressing Change Note  Drive Line: Existing VAD dressing removed and site care performed using sterile technique. Drive line exit site cleaned with Chlora prep applicators x 2, allowed to dry, and gauze dressing with silver strip re-applied. Exit site healing and unincorporated, the velour is fully implanted at exit site. Scant bloody drainage. No redness, tenderness, foul odor or rash noted. Drive line anchor secure. Every other day dressing changes per nurse champion or VAD coordinator. Dressing change due 03/31/20.

## 2020-03-29 NOTE — Progress Notes (Addendum)
Pharmacy Antibiotic Note  [name] [name] is a 69 y.o. male admitted on 03/04/2020 now s/p LVAD HMIII implant with elevated WBC and concern for PNA.  Pharmacy has been consulted for empiric cefepime and vancomycin dosing.  Pt remains on CRRT. ABX end today.  Plan: Continue cefepime to 2g q12hrs while on CRRT Continue vancomycin to 1250 mg IV q24h Monitor cultures, renal function/CRRT duration, vancomycin levels at steady state and LOT  Height: 6' (182.9 cm) Weight: 99.8 kg (220 lb 0.3 oz)(Head piollow and bed pad only on bed for weight) IBW/kg (Calculated) : 77.6  Temp (24hrs), Avg:95.9 F (35.5 C), Min:95.1 F (35.1 C), Max:96.3 F (35.7 C)  Recent Labs  Lab 03/25/20 0303 03/25/20 1532 03/26/20 0356 03/26/20 1536 03/27/20 0343 03/27/20 1541 03/28/20 0433 03/28/20 1013 03/28/20 1500 03/29/20 0318 03/29/20 0319  WBC 11.1*  --  6.8  --  10.4  --   --  14.7*  --  13.8*  --   CREATININE 3.98*   < > 3.18*   < > 2.44* 2.47* 2.41*  --  2.23*  --  2.10*  VANCORANDOM 28  --   --   --   --   --   --   --   --   --   --    < > = values in this interval not displayed.    Estimated Creatinine Clearance: 40 mL/min (A) (by C-G formula based on SCr of 2.1 mg/dL (H)).    Allergies  Allergen Reactions  . Liraglutide Nausea And Vomiting  . Lisinopril Cough    Antimicrobials this admission: Periop abx 4/8>>4/10 Cefepime 4/11 >>4/17 Vancomycin 4/12 >>4/17  Dose adjustments this admission: 4/13: CRRT dose adjustments for vancomycin and cefepime  Microbiology results: 4/7 MRSA PCR: neg 4/13 BCx: sent 4/12 UCx: ngF  Thank you for allowing pharmacy to be a part of this patient's care.  Arrie Senate, PharmD, BCPS Clinical Pharmacist [phone] Please check AMION for all [place] numbers 03/29/2020

## 2020-03-29 NOTE — Progress Notes (Signed)
ANTICOAGULATION CONSULT NOTE  Pharmacy Consult for warfarin Indication: post op LVAD  / Aflutter  Allergies  Allergen Reactions  . Liraglutide Nausea And Vomiting  . Lisinopril Cough    Patient Measurements: Height: 6' (182.9 cm) Weight: 99.8 kg (220 lb 0.3 oz)(Head piollow and bed pad only on bed for weight) IBW/kg (Calculated) : 77.6 Heparin Dosing Weight: 101.2 kg  Vital Signs: Temp: 95.8 F (35.4 C) (04/17 0032) Temp Source: Axillary (04/17 0032) BP: 75/56 (04/17 0800) Pulse Rate: 83 (04/17 0813)  Labs: Recent Labs    03/27/20 0343 03/27/20 0345 03/28/20 0433 03/28/20 0441 03/28/20 1013 03/28/20 1013 03/28/20 1500 03/29/20 0318 03/29/20 0319 03/29/20 0330  HGB 8.9*   < >  --    < > 9.8*   < >  --  10.2*  --  10.5*  HCT 27.2*   < >  --    < > 30.4*  --   --  31.5*  --  31.0*  PLT 139*  --   --   --  158  --   --  152  --   --   LABPROT 55.4*  --  24.1*  --   --   --   --  26.0*  --   --   INR 6.2*  --  2.2*  --   --   --   --  2.4*  --   --   CREATININE 2.44*   < > 2.41*  --   --   --  2.23*  --  2.10*  --    < > = values in this interval not displayed.    Estimated Creatinine Clearance: 40 mL/min (A) (by C-G formula based on SCr of 2.1 mg/dL (H)).   Medical History: Past Medical History:  Diagnosis Date  . Anxiety   . Arthritis   . CAD (coronary artery disease)    a.  1993 s/p MI - [employer];  b. s/p BMS to LAD '00;  c. PTCA 2nd diagonal 2010;  d. 02/18/12 Cath: moderate nonobs dzs - med rx;  e.  01/2015 Cath: LM nl, LAD 40-49m ISR, 70-78m/d, d1 90p (3.0x16 Synergy DES), D2 50-60, LCX nl, OM1 50p, 7m (2.5x12 Synergy DES), RCA nl, EF 30-35%.  . Chronic combined systolic and diastolic CHF (congestive heart failure) ([place])    a. 12/2014 Echo: EF 30-35%, Gr2 DD, mod MR, sev dil LA.  . CKD (chronic kidney disease), stage III   . Depression   . ED (erectile dysfunction)   . GERD (gastroesophageal reflux disease)   . Hyperlipidemia   . Hypertension   .  Ischemic cardiomyopathy    a. s/p St. [name] (Atlas) ICD implanted in [place] 2007;  b. 12/2014 Echo: Ef 30-35%.  . MVP (mitral valve prolapse)    a. s/p MV annuloplasty at [facility] 2004.  Marland Kitchen Persistent atrial fibrillation ([place])    a. noted on ICD interrogation '10 - not previously on [place] - CHA2DS2VASc = 5.  . Type II diabetes mellitus ([place])    uncontrolled    Medications:  Medications Prior to Admission  Medication Sig Dispense Refill Last Dose  . ALPRAZolam (XANAX) 0.25 MG tablet Take 0.25 mg by mouth 3 (three) times daily as needed for anxiety.    unknown  . amiodarone (PACERONE) 200 MG tablet Take 200 mg by mouth daily.    03/11/2020 at Unknown time  . apixaban (ELIQUIS) 5 MG TABS tablet Take 1 tablet (5 mg total)  by mouth 2 (two) times daily. 60 tablet 1 03/11/2020 at 0600  . atorvastatin (LIPITOR) 40 MG tablet Take 1 tablet (40 mg total) by mouth daily at 6 PM. 30 tablet 0 03/10/2020 at Unknown time  . citalopram (CELEXA) 20 MG tablet Take 20 mg by mouth daily.    03/11/2020 at Unknown time  . furosemide (LASIX) 40 MG tablet Take 40 mg by mouth daily.    03/11/2020 at Unknown time  . insulin aspart (NOVOLOG FLEXPEN) 100 UNIT/ML FlexPen Inject 6 Units into the skin 3 (three) times daily with meals. 15 mL 0 03/11/2020 at Unknown time  . Insulin Glargine (BASAGLAR KWIKPEN) 100 UNIT/ML SOPN Inject 0.5 mLs (50 Units total) into the skin daily. 15 mL 1 03/11/2020 at Unknown time  . metoprolol succinate (TOPROL-XL) 100 MG 24 hr tablet Take 1 tablet (100 mg total) by mouth daily. 90 tablet 3 03/11/2020 at 0600  . nitroGLYCERIN (NITROSTAT) 0.4 MG SL tablet Place 1 tablet (0.4 mg total) under the tongue every 5 (five) minutes x 3 doses as needed for chest pain. 10 tablet 0 unknown  . pantoprazole (PROTONIX) 40 MG tablet Take 1 tablet (40 mg total) by mouth daily. 30 tablet 1 03/11/2020 at Unknown time  . potassium chloride SA (KLOR-CON) 20 MEQ tablet Take 20 mEq by mouth daily.    03/11/2020 at Unknown  time  . tamsulosin (FLOMAX) 0.4 MG CAPS capsule Take 1 capsule (0.4 mg total) by mouth daily. 30 capsule 0 03/11/2020 at Unknown time  . ALPRAZolam (XANAX) 1 MG tablet Take 1 tablet (1 mg total) by mouth 2 (two) times daily as needed for anxiety. (Patient not taking: Reported on 03/11/2020) 10 tablet 0 Not Taking at Unknown time    Assessment: 71 YOM with atrial fibrillation on Eliquis PTA s/p R & L heart cath found to have 3v disease s/p  LVAD  Implant 4/8. Warfarin initiated on 4/9 after discussing with MD.  CT on 4/15 now found to have large right MCA stroke. INR had been elevated up to 9.0 postop, he received 1 dose of vitamin K 1 mg IV on 4/13 and again on 4/15 with INR still elevated at 6.2.   INR today is now 2.4. Discussed with HF MD, will reinstitute warfarin at very low dose and trend INR.  Goal of Therapy:  INR 2-2.5 Heparin level 0.3-0.7 units/ml Monitor platelets by anticoagulation protocol: Yes   Plan:  -Warfarin 0.5mg  PO x1 tonight -Daily INR   Arrie Senate, PharmD, BCPS Clinical Pharmacist [phone] Please check AMION for all [place] numbers 03/29/2020

## 2020-03-29 NOTE — Progress Notes (Signed)
STROKE TEAM PROGRESS NOTE   INTERVAL HISTORY RN at bedside. Pt on precedex and CRRT. Still drowsy on precedex. Earlier today he has significant hypotension, BP down to 60s. Put CRRT on  Hold and BP gradually improved, now CRRT resumed in half fluid removal volume. As per RN, without precedex pt agitated but able to orientated to age and month. On my exam, pt severe dysarthria and intangible for orientation questions, however, moving all extremities but significant asterixis.   Vitals:   03/29/20 0200 03/29/20 0300 03/29/20 0400 03/29/20 0500  BP: 91/69 (!) 77/62 (!) 75/58 (!) 74/61  Pulse: 100 (!) 130 (!) 29 (!) 30  Resp: (!) 21 17 14 11   Temp:      TempSrc:      SpO2: 90% 96% 97% 98%  Weight:      Height:        CBC:  Recent Labs  Lab 03/27/20 0343 03/27/20 0345 03/28/20 1013 03/28/20 1013 03/29/20 0318 03/29/20 0330  WBC 10.4   < > 14.7*  --  13.8*  --   NEUTROABS 8.9*  --   --   --  11.8*  --   HGB 8.9*   < > 9.8*   < > 10.2* 10.5*  HCT 27.2*   < > 30.4*   < > 31.5* 31.0*  MCV 90.7   < > 92.7  --  92.4  --   PLT 139*   < > 158  --  152  --    < > = values in this interval not displayed.    Basic Metabolic Panel:  Recent Labs  Lab 03/28/20 0433 03/28/20 0441 03/28/20 1500 03/28/20 1500 03/29/20 0318 03/29/20 0319 03/29/20 0330  NA 136   < > 136   < >  --  136 137  K 4.1   < > 4.2   < >  --  4.4 4.4  CL 102   < > 100  --   --  102  --   CO2 24   < > 22  --   --  24  --   GLUCOSE 231*   < > 239*  --   --  206*  --   BUN 38*   < > 36*  --   --  31*  --   CREATININE 2.41*   < > 2.23*  --   --  2.10*  --   CALCIUM 8.2*   < > 8.4*  --   --  8.4*  --   MG 2.6*  --   --   --  2.6*  --   --   PHOS 2.2*   < > 3.4  --   --  2.4*  --    < > = values in this interval not displayed.   Lipid Panel:     Component Value Date/Time   CHOL 89 04/03/2020 1312   TRIG 69 03/23/2020 1312   HDL 33 (L) 03/19/2020 1312   CHOLHDL 2.7 03/27/2020 1312   VLDL 14 03/30/2020 1312    LDLCALC 42 04/03/2020 1312   HgbA1c:  Lab Results  Component Value Date   HGBA1C 10.5 (H) 04/08/2020   Urine Drug Screen: No results found for: LABOPIA, COCAINSCRNUR, LABBENZ, AMPHETMU, THCU, LABBARB  Alcohol Level No results found for: ETH  IMAGING past 24 hours DG CHEST PORT 1 VIEW  Result Date: 03/28/2020 CLINICAL DATA:  CHF. EXAM: PORTABLE CHEST 1 VIEW COMPARISON:  03/27/2020, 03/26/2020 chest  radiograph. FINDINGS: Sequela of LVAD and left chest pacing device. Right IJ introducer sheath, right subclavian CVC terminating within the upper right atrium, bilateral chest tubes and partially imaged enteric tube terminating outside field of view are unchanged. Low lung volumes. Diffuse interstitial prominence and bilateral patchy opacities are unchanged. No pneumothorax or right pleural effusion. LVAD obscures left costophrenic sulcus. Cardiomegaly, sequela of left atrial appendage clipping and aortic atherosclerotic calcifications. Osseous structures are unchanged. IMPRESSION: Stable postsurgical appearance of the chest. Electronically Signed   By: Primitivo Gauze M.D.   On: 03/28/2020 10:04    PHYSICAL EXAM  Temp:  [95.1 F (35.1 C)-96.3 F (35.7 C)] 96 F (35.6 C) (04/17 0800) Pulse Rate:  [29-160] 87 (04/17 0900) Resp:  [11-24] 18 (04/17 0900) BP: (61-100)/(37-83) 61/37 (04/17 0900) SpO2:  [90 %-99 %] 91 % (04/17 0900) Arterial Line BP: (73-113)/(56-77) 101/71 (04/17 0900) Weight:  [99.8 kg] 99.8 kg (04/17 0600)  General - Well nourished, well developed, sedated on precedex, drowsy sleepy.  Ophthalmologic - fundi not visualized due to noncooperation.  Cardiovascular - irregularly irregular heart rate and rhythm.  Neuro - on precedex, drowsy sleepy, barely open eyes on voice, needs repetitive stimulation to keep eyes open. Severe dysarthria with sounds out but not intangible. No spontaneous speech, but following all simple commands but slow to respond. PERRL, not blinking to  visual threat, not tracking, doll's eyes present, positive corneal and gag. Facial symmetrical. RUE 3/5 and RLE 3-/5 with significant asterixis. RLE 2+/5 withdraw to pain and able to hold knee flexion and foot on bed position. LLE 2/5 withdraw and drift on knee flexion and foot on bed position. DTR 1+ and no babinski. Sensation, coordination and gait not tested.   ASSESSMENT/PLAN [name] [name] is a 69 y.o. male who was admitted for cardiac surgery. Post op course has been complicated by shock and agitation. Neurology consulted when [facility] showed RMCA infarct.    Stroke: R MCA cardioembolic stroke in setting of Afib, coagulopathy and acute HF s/p LVAD, MAZE and LAA clipping  CT head: Right MCA infarct, posterior division.  2D Echo EF 20-25%, global hypokinesis with mod LVH, aortic dilation, severe reduced RV syst function.   LDL 42  HgbA1c 10.5  VTE prophylaxis coumadin with therapeutic INR  Eliquis prior to admission, now on ASA and Coumadin with INR goal 2-3. He is at risk for hemorrhagic conversion of stroke, but the need for currently anticoagulation in setting of LVAD takes precedence over the risk at this time.   Therapy recommendations:  CIR  Disposition:  pending  Cardiomyopathy  Status post LVAD placement  EF 20 to 25%  On Coumadin with INR goal 2-3  On milrinone IV  Cardiology on board  Chronic A. Fib  Status post maze procedure and LAA clipping  Eliquis PTA, now switched to Coumadin due to renal failure and LVAD placement  INR goal 2-3  On amiodarone IV  Acute renal failure  Creatinine improving, 2.47-2.41-2.23-2.10  On CRRT  Nephrology following  History of hypertension Hypotension  Home meds: Toprol, tamsulon, Amioderone, nitro  BP on the lower end  Low BP likely due to CRRT . Avoid low BP . Off Levophed now . Long-term BP goal normotensive  Hyperlipidemia  Home meds:  Lipitor 40mg , resumed in hospital  LDL 42, goal <  70  Continue statin at discharge  Diabetes type II Uncontrolled  Home meds:  Novolog, kwikpen  HgbA1c 10.5, goal < 7.0  CBGs  On Lantus  SSI  Sepsis,?  Aspiration pneumonia  Hypothermia  Leukocytosis WBC 10.4-14.7  CCM on board  On cefepime and vancomycin  Dysphagia  N.p.o. status  On tube feeding at 60 cc/h  Speech on board  Other Stroke Risk Factors  Advanced age  CAD w/unstable angina- s/p PCI  Other Active Problems  Acute hypoxic respiratory failure- off Bipap now  Coagulopathy- seen by hematology; likely d/t cirrhosis and malabsorption of K  Palliative Care following - limited code  Hospital day # 17  Neurology has no new recommendation, will sign off for now. Please do not hesitant to call with questions. Pt will follow up with stroke clinic NP at [facility] in about 4 weeks after discharge. Thanks for the consult.  Rosalin Hawking, MD PhD Stroke Neurology 03/29/2020 7:46 PM  To contact Stroke Continuity provider, please refer to [email or web address] After hours, contact General Neurology

## 2020-03-29 NOTE — Procedures (Signed)
Admit: 03/11/2020 LOS: 36  32M dialysis dependent AKI on CRRT, acute CHF exacerbation s/p VAD, status post MV repair, AFib s/p MAZE and LAA clipping 4/8; R MCA CVA 4/15  Current CRRT Prescription: Start Date: 4/13 Catheter: L [place] Temp HD cath BFR: 200 Pre Blood Pump: 500 4K DFR: 1500 4K Replacement Rate: 200 4K Goal UF: 150-251mL/h net neg Anticoagulation: none Clotting: acceptable   S:  Stable, tolerating UF  K 4.4, P 2.4  nO uop  3.9L neg yesterday  O: 04/16 0701 - 04/17 0700 In: 3764.8 [I.V.:912.6; NG/GT:2055; IV Piggyback:797.2] Out: 4650   Filed Weights   03/27/20 0500 03/28/20 0400 03/29/20 0600  Weight: 112.4 kg 107.9 kg 99.8 kg    Recent Labs  Lab 03/28/20 0433 03/28/20 0441 03/28/20 1500 03/29/20 0319 03/29/20 0330  NA 136   < > 136 136 137  K 4.1   < > 4.2 4.4 4.4  CL 102  --  100 102  --   CO2 24  --  22 24  --   GLUCOSE 231*  --  239* 206*  --   BUN 38*  --  36* 31*  --   CREATININE 2.41*  --  2.23* 2.10*  --   CALCIUM 8.2*  --  8.4* 8.4*  --   PHOS 2.2*  --  3.4 2.4*  --    < > = values in this interval not displayed.   Recent Labs  Lab 03/26/20 0356 03/26/20 0358 03/27/20 0343 03/27/20 0345 03/28/20 1013 03/29/20 0318 03/29/20 0330  WBC 6.8   < > 10.4  --  14.7* 13.8*  --   NEUTROABS 5.5  --  8.9*  --   --  11.8*  --   HGB 9.0*   < > 8.9*   < > 9.8* 10.2* 10.5*  HCT 26.9*   < > 27.2*   < > 30.4* 31.5* 31.0*  MCV 90.9   < > 90.7  --  92.7 92.4  --   PLT 125*   < > 139*  --  158 152  --    < > = values in this interval not displayed.    Scheduled Meds: . sodium chloride   Intravenous Once  . aspirin  81 mg Per Tube Daily  . atorvastatin  80 mg Per Tube q1800  . B-complex with vitamin C  1 tablet Per Tube Daily  . bisacodyl  10 mg Oral Daily   Or  . bisacodyl  10 mg Rectal Daily  . chlorhexidine  15 mL Mouth Rinse BID  . Chlorhexidine Gluconate Cloth  6 each Topical Daily  . docusate  200 mg Per Tube Daily  . feeding  supplement (PRO-STAT SUGAR FREE 64)  30 mL Per Tube Daily  . influenza vaccine adjuvanted  0.5 mL Intramuscular Tomorrow-1000  . insulin aspart  0-24 Units Subcutaneous Q4H  . insulin aspart  3 Units Subcutaneous Q4H  . insulin glargine  10 Units Subcutaneous Daily  . mouth rinse  15 mL Mouth Rinse q12n4p  . melatonin  3 mg Per Tube QHS  . pantoprazole (PROTONIX) IV  40 mg Intravenous Daily  . sildenafil  20 mg Per Tube TID  . sodium chloride flush  10-40 mL Intracatheter Q12H  . sodium chloride flush  3 mL Intravenous Q12H  . Warfarin - Pharmacist Dosing Inpatient   Does not apply q1600   Continuous Infusions: .  prismasol BGK 4/2.5 500 mL/hr at 03/28/20 1010  .  prismasol BGK 4/2.5 500 mL (03/29/20 0620)  . sodium chloride Stopped (03/22/20 2011)  . sodium chloride Stopped (03/27/20 1523)  . sodium chloride    . sodium chloride 20 mL/hr at 03/27/20 0500  . sodium chloride    . amiodarone 30 mg/hr (03/29/20 0600)  . ceFEPime (MAXIPIME) IV Stopped (03/28/20 2212)  . dexmedetomidine (PRECEDEX) IV infusion 0.4 mcg/kg/hr (03/29/20 0600)  . feeding supplement (VITAL 1.5 CAL) 60 mL/hr at 03/29/20 0600  . lactated ringers    . lactated ringers 10 mL/hr at 03/29/20 0600  . milrinone 0.375 mcg/kg/min (03/29/20 0600)  . norepinephrine (LEVOPHED) Adult infusion Stopped (03/21/20 1049)  . prismasol BGK 4/2.5 1,500 mL/hr (03/29/20 0407)  . vancomycin Stopped (03/28/20 1307)   PRN Meds:.sodium chloride, Place/Maintain arterial line **AND** sodium chloride, acetaminophen, dextrose, fentaNYL (SUBLIMAZE) injection, heparin, heparin, hydrALAZINE, HYDROcodone-acetaminophen, lactated ringers, levalbuterol, LORazepam, ondansetron (ZOFRAN) IV, ondansetron (ZOFRAN) IV, oxyCODONE, sodium chloride, sodium chloride flush, sodium chloride flush, sorbitol, traMADol  ABG    Component Value Date/Time   PHART 7.478 (H) 03/29/2020 0330   PCO2ART 33.7 03/29/2020 0330   PO2ART 103.0 03/29/2020 0330   HCO3  25.5 03/29/2020 0330   TCO2 27 03/29/2020 0330   ACIDBASEDEF 2.0 03/26/2020 0358   O2SAT 99.0 03/29/2020 0330    A/P  1. 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 Hypophosphatemia 3. sCHF s/p VAD 4/8 4. AFib s/p MAZE and LAA clipping 4/8 5. DM2 6. R MCA ischemic CVA  Stable on CRRT, tolerating UF. Replete phos this AM.    Pearson Grippe, MD [facility]

## 2020-03-29 NOTE — Evaluation (Signed)
Clinical/Bedside Swallow Evaluation Patient Details  Name: [name] [name] MRN: [id number] Date of Birth: [birth year]  Today's Date: 03/29/2020 Time: SLP Start Time (ACUTE ONLY): 5732 SLP Stop Time (ACUTE ONLY): 0940 SLP Time Calculation (min) (ACUTE ONLY): 15 min  Past Medical History:  Past Medical History:  Diagnosis Date  . Anxiety   . Arthritis   . CAD (coronary artery disease)    a.  1993 s/p MI - [employer];  b. s/p BMS to LAD '00;  c. PTCA 2nd diagonal 2010;  d. 02/18/12 Cath: moderate nonobs dzs - med rx;  e.  01/2015 Cath: LM nl, LAD 40-45m ISR, 70-1m/d, d1 90p (3.0x16 Synergy DES), D2 50-60, LCX nl, OM1 50p, 42m (2.5x12 Synergy DES), RCA nl, EF 30-35%.  . Chronic combined systolic and diastolic CHF (congestive heart failure) ([place])    a. 12/2014 Echo: EF 30-35%, Gr2 DD, mod MR, sev dil LA.  . CKD (chronic kidney disease), stage III   . Depression   . ED (erectile dysfunction)   . GERD (gastroesophageal reflux disease)   . Hyperlipidemia   . Hypertension   . Ischemic cardiomyopathy    a. s/p St. [name] (Atlas) ICD implanted in [place] 2007;  b. 12/2014 Echo: Ef 30-35%.  . MVP (mitral valve prolapse)    a. s/p MV annuloplasty at [facility] 2004.  Marland Kitchen Persistent atrial fibrillation ([place])    a. noted on ICD interrogation '10 - not previously on [place] - CHA2DS2VASc = 5.  . Type II diabetes mellitus ([place])    uncontrolled   Past Surgical History:  Past Surgical History:  Procedure Laterality Date  . CARDIAC DEFIBRILLATOR PLACEMENT  2007   implanted in [place], has a 7001 RV lead and a SJM Atlas ICD followed by Dr Caryl Comes  . CARDIOVERSION N/A 09/29/2016   Procedure: CARDIOVERSION;  Surgeon: Lelon Perla, MD;  Location: [facility] ENDOSCOPY;  Service: Cardiovascular;  Laterality: N/A;  . CARDIOVERSION N/A 12/03/2016   Procedure: CARDIOVERSION;  Surgeon: Dorothy Spark, MD;  Location: [place];  Service: Cardiovascular;  Laterality: N/A;  . CLIPPING OF ATRIAL APPENDAGE  Left 03/25/2020   Procedure: Clipping Of Atrial Appendage;  Surgeon: Wonda Olds, MD;  Location: [place];  Service: Open Heart Surgery;  Laterality: Left;  . IMPLANTABLE CARDIOVERTER DEFIBRILLATOR (ICD) GENERATOR CHANGE N/A 03/19/2013   SJM Fortify ST DR generator placed by Dr Lovena Le, part of Analyze ST study  . INSERTION OF IMPLANTABLE LEFT VENTRICULAR ASSIST DEVICE N/A 03/22/2020   Procedure: INSERTION OF IMPLANTABLE LEFT VENTRICULAR ASSIST DEVICE - HM3;  Surgeon: Wonda Olds, MD;  Location: [place];  Service: Open Heart Surgery;  Laterality: N/A;  . LEFT AND RIGHT HEART CATHETERIZATION WITH CORONARY ANGIOGRAM N/A 01/14/2015   Procedure: LEFT AND RIGHT HEART CATHETERIZATION WITH CORONARY ANGIOGRAM;  Surgeon: Peter M Martinique, MD;  Location: [facility] CATH LAB;  Service: Cardiovascular;  Laterality: N/A;  . LEFT HEART CATHETERIZATION WITH CORONARY ANGIOGRAM N/A 02/17/2012   Procedure: LEFT HEART CATHETERIZATION WITH CORONARY ANGIOGRAM;  Surgeon: Jolaine Artist, MD;  Location: [facility] CATH LAB;  Service: Cardiovascular;  Laterality: N/A;  . MAZE N/A 04/03/2020   Procedure: MAZE;  Surgeon: Wonda Olds, MD;  Location: [place];  Service: Open Heart Surgery;  Laterality: N/A;  . MITRAL VALVE ANNULOPLASTY  2004   Archie Endo 02/17/2012  . MULTIPLE EXTRACTIONS WITH ALVEOLOPLASTY N/A 04/11/2020   Procedure: Extraction of tooth #'s 20,21,22 and 23 with alveoloplasaty;  Surgeon: Lenn Cal, DDS;  Location: [place];  Service: Oral Surgery;  Laterality: N/A;  . RIGHT/LEFT HEART CATH AND CORONARY ANGIOGRAPHY N/A 02/19/2020   Procedure: RIGHT/LEFT HEART CATH AND CORONARY ANGIOGRAPHY;  Surgeon: Burnell Blanks, MD;  Location: [place] CV LAB;  Service: Cardiovascular;  Laterality: N/A;  . TEE WITHOUT CARDIOVERSION N/A 09/29/2016   Procedure: TRANSESOPHAGEAL ECHOCARDIOGRAM (TEE);  Surgeon: Lelon Perla, MD;  Location: [facility] ENDOSCOPY;  Service: Cardiovascular;  Laterality: N/A;  . TEE WITHOUT CARDIOVERSION N/A  12/03/2016   Procedure: TRANSESOPHAGEAL ECHOCARDIOGRAM (TEE);  Surgeon: Dorothy Spark, MD;  Location: [place];  Service: Cardiovascular;  Laterality: N/A;  . TEE WITHOUT CARDIOVERSION N/A 04/07/2020   Procedure: TRANSESOPHAGEAL ECHOCARDIOGRAM (TEE);  Surgeon: Wonda Olds, MD;  Location: [place];  Service: Open Heart Surgery;  Laterality: N/A;   HPI:  Patient is a 69 y.o. male with PMH: ICM, systolic CHF EF 16%, LAD stenting, s/p AICD, MVP s/p mitral valve repair 2004, atrial fibrillation on anticoagulation, HTN, HLD, DM, depression, who was admitted to [facility] with worsening weakeness, fatigue, dyspnea, chest pressure and was then transferred to [facility] for cardiac cath which showed severe three vessel CAD and ECHO shows EF 10-15% with moderate RV dysfunction. Since 4/12, he has had continued decline in mental status, began CRRT on 4/13, required BiPAP support. Head CT revealed large posterior MCA infarct on right side.   Assessment / Plan / Recommendation Clinical Impression  Patient presents with a mild-mod oropharyngeal dysphagia with some impact from current deconditioned state. He exhibited mild severity of delayed cough after 2 of 12 straw sips of thin liquids but no change in vocal quality which was mildly hoarse and weak prior to PO's. He did exhibit suspected swallow initiation delay with thin liquids and puree solids, but had good laryngeal elevation and pharyngeal contraction per palpation. SLP Visit Diagnosis: Dysphagia, unspecified (R13.10)    Aspiration Risk  Moderate aspiration risk;Mild aspiration risk    Diet Recommendation NPO;Ice chips PRN after oral care;Free water protocol after oral care   Liquid Administration via: Cup;Straw Medication Administration: Via alternative means Supervision: Full supervision/cueing for compensatory strategies Compensations: Minimize environmental distractions;Slow rate;Small sips/bites Postural Changes: Seated upright at 90  degrees    Other  Recommendations Oral Care Recommendations: Staff/trained caregiver to provide oral care;Oral care QID   Follow up Recommendations Skilled Nursing facility;24 hour supervision/assistance(TBD pending progress)      Frequency and Duration min 2x/week  1 week       Prognosis Prognosis for Safe Diet Advancement: Good      Swallow Study   General Date of Onset: 02/16/2020 HPI: Patient is a 69 y.o. male with PMH: ICM, systolic CHF EF 10%, LAD stenting, s/p AICD, MVP s/p mitral valve repair 2004, atrial fibrillation on anticoagulation, HTN, HLD, DM, depression, who was admitted to [facility] with worsening weakeness, fatigue, dyspnea, chest pressure and was then transferred to [facility] for cardiac cath which showed severe three vessel CAD and ECHO shows EF 10-15% with moderate RV dysfunction. Since 4/12, he has had continued decline in mental status, began CRRT on 4/13, required BiPAP support. Head CT revealed large posterior MCA infarct on right side. Type of Study: Bedside Swallow Evaluation Previous Swallow Assessment: N/A Diet Prior to this Study: NPO Temperature Spikes Noted: Yes(96.1 4/17) Respiratory Status: Nasal cannula Behavior/Cognition: Alert;Confused;Distractible;Requires cueing Oral Cavity Assessment: Within Functional Limits Oral Care Completed by SLP: Yes Self-Feeding Abilities: Total assist;Needs set up;Needs assist Patient Positioning: Upright in bed Baseline Vocal Quality: Hoarse;Other (comment)(mildly hoarse/dry) Volitional Cough: Cognitively  unable to elicit Volitional Swallow: Unable to elicit    Oral/Motor/Sensory Function Overall Oral Motor/Sensory Function: Mild impairment Facial ROM: Reduced right Facial Symmetry: Abnormal symmetry right Lingual Symmetry: Within Functional Limits Lingual Strength: Reduced   Ice Chips Ice chips: Impaired Presentation: Spoon Oral Phase Functional Implications: Prolonged oral transit Pharyngeal Phase  Impairments: Suspected delayed Swallow   Thin Liquid Thin Liquid: Impaired Presentation: Cup;Straw Pharyngeal  Phase Impairments: Suspected delayed Swallow;Cough - Delayed Other Comments: mild frequency of delayed cough but unable to differentiate between cough from water or cough from loosening pharyngeal secretions    Nectar Thick Nectar Thick Liquid: Not tested   Honey Thick Honey Thick Liquid: Not tested   Puree Puree: Impaired Presentation: Spoon Oral Phase Impairments: Poor awareness of bolus Pharyngeal Phase Impairments: Suspected delayed Swallow   Solid     Solid: Not tested Oral Phase Impairments: Poor awareness of bolus Pharyngeal Phase Impairments: Suspected delayed Swallow      Sonia Baller, MA, CCC-SLP Speech Therapy [place] [facility] Pager: [phone]

## 2020-03-29 NOTE — Progress Notes (Signed)
9 Days Post-Op Procedure(s) (LRB): REDO STERNOTOMY WITH STERNAL PLATING (N/A) INSERTION OF IMPLANTABLE LEFT VENTRICULAR ASSIST DEVICE - HM3 (N/A) MAZE (N/A) TRANSESOPHAGEAL ECHOCARDIOGRAM (TEE) (N/A) Clipping Of Atrial Appendage (Left) Subjective: Patient breathing comfortably more responsive today Remains in rate controlled atrial fibrillation LVAD flow and parameters are satisfactory Breathing comfortably on nasal cannula Chest x-ray remains wet, CVP 10-12 on CVVH -100 cc/h Objective: Vital signs in last 24 hours: Temp:  [95.1 F (35.1 C)-96.1 F (35.6 C)] 96.1 F (35.6 C) (04/17 1400) Pulse Rate:  [29-160] 31 (04/17 1430) Cardiac Rhythm: Atrial fibrillation (04/17 1400) Resp:  [11-22] 11 (04/17 1430) BP: (61-100)/(37-83) 73/50 (04/17 1430) SpO2:  [90 %-99 %] 94 % (04/17 1430) Arterial Line BP: (73-113)/(56-77) 89/67 (04/17 1400) Weight:  [99.8 kg] 99.8 kg (04/17 0600)  Hemodynamic parameters for last 24 hours: CVP:  [6 mmHg-19 mmHg] 6 mmHg  Intake/Output from previous day: 04/16 0701 - 04/17 0700 In: 3809.6 [I.V.:957.4; NG/GT:2055; IV Piggyback:797.2] Out: 7944  Intake/Output this shift: Total I/O In: 901.9 [I.V.:222.1; NG/GT:420; IV Piggyback:259.9] Out: 904 [Other:904]  Exam Patient responsive Coarse breath sounds Incisions clean dry Normal VAD hum 2+ peripheral edema  Lab Results: Recent Labs    03/28/20 1013 03/28/20 1013 03/29/20 0318 03/29/20 0330  WBC 14.7*  --  13.8*  --   HGB 9.8*   < > 10.2* 10.5*  HCT 30.4*   < > 31.5* 31.0*  PLT 158  --  152  --    < > = values in this interval not displayed.   BMET:  Recent Labs    03/28/20 1500 03/28/20 1500 03/29/20 0319 03/29/20 0330  NA 136   < > 136 137  K 4.2   < > 4.4 4.4  CL 100  --  102  --   CO2 22  --  24  --   GLUCOSE 239*  --  206*  --   BUN 36*  --  31*  --   CREATININE 2.23*  --  2.10*  --   CALCIUM 8.4*  --  8.4*  --    < > = values in this interval not displayed.    PT/INR:   Recent Labs    03/29/20 0318  LABPROT 26.0*  INR 2.4*   ABG    Component Value Date/Time   PHART 7.478 (H) 03/29/2020 0330   HCO3 25.5 03/29/2020 0330   TCO2 27 03/29/2020 0330   ACIDBASEDEF 2.0 03/26/2020 0358   O2SAT 99.0 03/29/2020 0330   CBG (last 3)  Recent Labs    03/29/20 0334 03/29/20 0736 03/29/20 1150  GLUCAP 207* 226* 245*    Assessment/Plan: S/P Procedure(s) (LRB): REDO STERNOTOMY WITH STERNAL PLATING (N/A) INSERTION OF IMPLANTABLE LEFT VENTRICULAR ASSIST DEVICE - HM3 (N/A) MAZE (N/A) TRANSESOPHAGEAL ECHOCARDIOGRAM (TEE) (N/A) Clipping Of Atrial Appendage (Left) Continue volume removal with CVVH then transition to HD so patient can be mobilized and work with physical therapy INR 2.4, Coumadin per pharmacy  LOS: 17 days    [name] [name] 03/29/2020

## 2020-03-29 NOTE — Evaluation (Signed)
Speech Language Pathology Evaluation Patient Details Name: [name] [name] MRN: [id number] DOB: 12/26/1950 Today's Date: 03/29/2020 Time: 0940-1000 SLP Time Calculation (min) (ACUTE ONLY): 20 min  Problem List:  Patient Active Problem List   Diagnosis Date Noted  . Coagulopathy ([place])   . AKI (acute kidney injury) ([place])   . Cardiogenic shock ([place]) 03/21/2020  . LVAD (left ventricular assist device) present ([place]) 03/21/2020  . Ischemic dilated cardiomyopathy ([place]) 03/19/2020  . Palliative care by specialist   . TSH elevation   . Severe episode of recurrent major depressive disorder, without psychotic features ([place]) 02/07/2020  . Diabetic retinopathy associated with type 2 diabetes mellitus ([place]) 01/30/2020  . Elevated troponin   . Diarrhea 01/05/2020  . Abdominal pain 01/05/2020  . Acute on chronic combined systolic and diastolic CHF (congestive heart failure) ([place])   . Benign prostatic hyperplasia   . Tick bite 06/02/2019  . COVID-19 04/17/2019  . Chronic anticoagulation 09/22/2018  . CKD (chronic kidney disease) 09/22/2018  . Pneumonia, unspecified organism 09/22/2018  . Atrial fibrillation with RVR ([place])   . A-fib ([place]) 11/29/2016  . Atrial fibrillation ([place]) 11/29/2016  . Hyperglycemia   . Atrial flutter with rapid ventricular response ([place]) 09/27/2016  . Dizziness 09/27/2016  . Chest pain   . Shortness of breath   . S/P coronary artery stent placement   . Chronic systolic heart failure ([place]) 04/22/2015  . Abnormal nuclear stress test 01/14/2015  . Chronic systolic dysfunction of left ventricle 07/25/2013  . DM (diabetes mellitus), type 2 ([place]) 03/19/2013  . ICD (implantable cardiac defibrillator) battery depletion 03/18/2013  . DNR (do not resuscitate) discussion 03/18/2013  . Syncope 09/15/2012  . CAD, multiple vessel 02/19/2012  . Unstable angina ([place]) 02/19/2012  . Automatic implantable cardioverter-defibrillator in situ 08/18/2011  . Ischemic cardiomyopathy  05/05/2011  . Hypertension   . Hyperlipidemia   . GERD (gastroesophageal reflux disease)   . Non-rheumatic mitral regurgitation   . IHD (ischemic heart disease)   . Anxiety   . Bipolar I disorder with depression ([place]) 01/06/2011   Past Medical History:  Past Medical History:  Diagnosis Date  . Anxiety   . Arthritis   . CAD (coronary artery disease)    a.  1993 s/p MI - [employer];  b. s/p BMS to LAD '00;  c. PTCA 2nd diagonal 2010;  d. 02/18/12 Cath: moderate nonobs dzs - med rx;  e.  01/2015 Cath: LM nl, LAD 40-70m ISR, 70-37m/d, d1 90p (3.0x16 Synergy DES), D2 50-60, LCX nl, OM1 50p, 67m (2.5x12 Synergy DES), RCA nl, EF 30-35%.  . Chronic combined systolic and diastolic CHF (congestive heart failure) ([place])    a. 12/2014 Echo: EF 30-35%, Gr2 DD, mod MR, sev dil LA.  . CKD (chronic kidney disease), stage III   . Depression   . ED (erectile dysfunction)   . GERD (gastroesophageal reflux disease)   . Hyperlipidemia   . Hypertension   . Ischemic cardiomyopathy    a. s/p St. [name] (Atlas) ICD implanted in [place] 2007;  b. 12/2014 Echo: Ef 30-35%.  . MVP (mitral valve prolapse)    a. s/p MV annuloplasty at [facility] 2004.  Marland Kitchen Persistent atrial fibrillation ([place])    a. noted on ICD interrogation '10 - not previously on [place] - CHA2DS2VASc = 5.  . Type II diabetes mellitus ([place])    uncontrolled   Past Surgical History:  Past Surgical History:  Procedure Laterality Date  . CARDIAC DEFIBRILLATOR PLACEMENT  2007  implanted in [place], has a 7001 RV lead and a SJM Atlas ICD followed by Dr Caryl Comes  . CARDIOVERSION N/A 09/29/2016   Procedure: CARDIOVERSION;  Surgeon: Lelon Perla, MD;  Location: [facility] ENDOSCOPY;  Service: Cardiovascular;  Laterality: N/A;  . CARDIOVERSION N/A 12/03/2016   Procedure: CARDIOVERSION;  Surgeon: Dorothy Spark, MD;  Location: [place];  Service: Cardiovascular;  Laterality: N/A;  . CLIPPING OF ATRIAL APPENDAGE Left 03/23/2020   Procedure: Clipping  Of Atrial Appendage;  Surgeon: Wonda Olds, MD;  Location: [place];  Service: Open Heart Surgery;  Laterality: Left;  . IMPLANTABLE CARDIOVERTER DEFIBRILLATOR (ICD) GENERATOR CHANGE N/A 03/19/2013   SJM Fortify ST DR generator placed by Dr Lovena Le, part of Analyze ST study  . INSERTION OF IMPLANTABLE LEFT VENTRICULAR ASSIST DEVICE N/A 04/06/2020   Procedure: INSERTION OF IMPLANTABLE LEFT VENTRICULAR ASSIST DEVICE - HM3;  Surgeon: Wonda Olds, MD;  Location: [place];  Service: Open Heart Surgery;  Laterality: N/A;  . LEFT AND RIGHT HEART CATHETERIZATION WITH CORONARY ANGIOGRAM N/A 01/14/2015   Procedure: LEFT AND RIGHT HEART CATHETERIZATION WITH CORONARY ANGIOGRAM;  Surgeon: Peter M Martinique, MD;  Location: [facility] CATH LAB;  Service: Cardiovascular;  Laterality: N/A;  . LEFT HEART CATHETERIZATION WITH CORONARY ANGIOGRAM N/A 02/17/2012   Procedure: LEFT HEART CATHETERIZATION WITH CORONARY ANGIOGRAM;  Surgeon: Jolaine Artist, MD;  Location: [facility] CATH LAB;  Service: Cardiovascular;  Laterality: N/A;  . MAZE N/A 03/23/2020   Procedure: MAZE;  Surgeon: Wonda Olds, MD;  Location: [place];  Service: Open Heart Surgery;  Laterality: N/A;  . MITRAL VALVE ANNULOPLASTY  2004   Archie Endo 02/17/2012  . MULTIPLE EXTRACTIONS WITH ALVEOLOPLASTY N/A 04/09/2020   Procedure: Extraction of tooth #'s 20,21,22 and 23 with alveoloplasaty;  Surgeon: Lenn Cal, DDS;  Location: [place];  Service: Oral Surgery;  Laterality: N/A;  . RIGHT/LEFT HEART CATH AND CORONARY ANGIOGRAPHY N/A 02/21/2020   Procedure: RIGHT/LEFT HEART CATH AND CORONARY ANGIOGRAPHY;  Surgeon: Burnell Blanks, MD;  Location: [place] CV LAB;  Service: Cardiovascular;  Laterality: N/A;  . TEE WITHOUT CARDIOVERSION N/A 09/29/2016   Procedure: TRANSESOPHAGEAL ECHOCARDIOGRAM (TEE);  Surgeon: Lelon Perla, MD;  Location: [facility] ENDOSCOPY;  Service: Cardiovascular;  Laterality: N/A;  . TEE WITHOUT CARDIOVERSION N/A 12/03/2016   Procedure:  TRANSESOPHAGEAL ECHOCARDIOGRAM (TEE);  Surgeon: Dorothy Spark, MD;  Location: [place];  Service: Cardiovascular;  Laterality: N/A;  . TEE WITHOUT CARDIOVERSION N/A 04/02/2020   Procedure: TRANSESOPHAGEAL ECHOCARDIOGRAM (TEE);  Surgeon: Wonda Olds, MD;  Location: [place];  Service: Open Heart Surgery;  Laterality: N/A;   HPI:  Patient is a 69 y.o. male with PMH: ICM, systolic CHF EF 93%, LAD stenting, s/p AICD, MVP s/p mitral valve repair 2004, atrial fibrillation on anticoagulation, HTN, HLD, DM, depression, who was admitted to [facility] with worsening weakeness, fatigue, dyspnea, chest pressure and was then transferred to [facility] for cardiac cath which showed severe three vessel CAD and ECHO shows EF 10-15% with moderate RV dysfunction. Since 4/12, he has had continued decline in mental status, began CRRT on 4/13, required BiPAP support. Head CT revealed large posterior MCA infarct on right side.   Assessment / Plan / Recommendation Clinical Impression  Patient presents with a mild-moderate cognitive-linguistic impairment but without dysarthria. He was able to demonstrate orientration to place, situation "heart trouble", self and time (stated Month as April, year as 2021, but unable to state day/date and perseverated on saying "2021"). Affect was flat and  patient continuously requested water. Patient did not demonstrate any awareness to current deficits beyond being able to tell clinician that he is in hospital for "heart trouble". It was difficult to determine if he was perseverating on asking for water, or if he really wanted water so frequently, as he has recently been on BiPAP. Patient was not able to respond to any basic level, open-ended biographical questions but his poor attention overall likely impacted his ability to demonstrate cognitive abilities in areas of memory/recall, reasoning, problem solving, etc. He would benefit from a more comprehensive cognitive-linguistic  evaluation when he is more attentive and more medically stable.    SLP Assessment  SLP Recommendation/Assessment: Patient needs continued Speech Lanaguage Pathology Services SLP Visit Diagnosis: Cognitive communication deficit (R41.841)    Follow Up Recommendations  Skilled Nursing facility;24 hour supervision/assistance(TBD pending progress)    Frequency and Duration min 2x/week         SLP Evaluation Cognition  Overall Cognitive Status: Impaired/Different from baseline Arousal/Alertness: Awake/alert Orientation Level: Oriented to person;Oriented to place;Oriented to time;Oriented to situation;Other (comment) Attention: Focused Focused Attention: Impaired Focused Attention Impairment: Verbal basic;Functional basic Awareness: Impaired Awareness Impairment: Emergent impairment Problem Solving: Impaired Problem Solving Impairment: Verbal basic Behaviors: Perseveration;Restless Safety/Judgment: Impaired       Comprehension  Auditory Comprehension Yes/No Questions: Not tested Commands: Within Functional Limits(WFL for one step basic) Conversation: Simple Interfering Components: Attention;Processing speed EffectiveTechniques: Extra processing time;Repetition Visual Recognition/Discrimination Discrimination: Not tested Reading Comprehension Reading Status: Not tested    Expression Expression Primary Mode of Expression: Verbal Verbal Expression Overall Verbal Expression: Other (comment)(Patient unable to attend to full language evaluation)   Oral / Motor  Oral Motor/Sensory Function Overall Oral Motor/Sensory Function: Mild impairment Facial ROM: Reduced right Facial Symmetry: Abnormal symmetry right Lingual Symmetry: Within Functional Limits Lingual Strength: Reduced Motor Speech Overall Motor Speech: Appears within functional limits for tasks assessed Resonance: Within functional limits Articulation: Within functional limitis Intelligibility: Intelligible Motor  Planning: Witnin functional limits Motor Speech Errors: Not applicable   GO                    Sonia Baller, MA, CCC-SLP Speech Therapy [facility] Acute Rehab Pager: [phone]

## 2020-03-29 NOTE — Progress Notes (Signed)
Patient ID: [name] [name], male   DOB: 08/17/1951, 69 y.o.   MRN: [id number]    Advanced Heart Failure Rounding Note   Subjective:    - S/p HM3 VAD 4/8 w MAZE procedure + LAA clipping. - Extubated 4/9 - 4/12 LVAD speed increased to 5400 --> echo moderate-severe RV dysfunction, septum mildly left shifted, trivial pericardial effusion.  LVAD speed cut back to 5300.  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   He is currently on milrinone 0.375, amiodarone 30 mg/hr.  MAP 70s-80s.  CVVH ongoing pulling net 100 cc/hr, weight down significantly.  CVP 7 today with co-ox 57%.   INR 2.2 => 2.4.   He is now on HFNC 6L.  He will awaken and follow commands, oriented to person and intermittently place.  He can move his left side but it seems weaker. On low dose Precedex for agitation.   LDH 467 => 453 => 454 => 474.   Afebrile, remains on empiric cefepime.   VAD Interrogation  Flow 5, Speed 5300, Power 3.8, Pulse Index 2.7  6 PI events.   Objective:   Weight Range:  Vital Signs:   Temp:  [95.1 F (35.1 C)-96.3 F (35.7 C)] 95.8 F (35.4 C) (04/17 0032) Pulse Rate:  [29-160] 83 (04/17 0813) Resp:  [11-24] 17 (04/17 0813) BP: (74-100)/(56-83) 75/56 (04/17 0800) SpO2:  [90 %-99 %] 93 % (04/17 0813) Arterial Line BP: (73-113)/(56-77) 79/62 (04/17 0700) Weight:  [99.8 kg] 99.8 kg (04/17 0600) Last BM Date: 03/27/20  Weight change: Filed Weights   03/27/20 0500 03/28/20 0400 03/29/20 0600  Weight: 112.4 kg 107.9 kg 99.8 kg    Intake/Output:   Intake/Output Summary (Last 24 hours) at 03/29/2020 0832 Last data filed at 03/29/2020 0800 Gross per 24 hour  Intake 3711.43 ml  Output 7965 ml  Net -4253.57 ml       Physical Exam: CVP 6 General: NAD.  HEENT: Normal. Neck: Supple, JVP 7-8 cm. Carotids OK.  Cardiac:  Mechanical heart sounds with LVAD hum present.  Lungs:  CTAB, normal effort.  Abdomen:  NT, ND, no HSM. No bruits or masses. +BS  LVAD exit site:  Well-healed and incorporated. Dressing dry and intact. No erythema or drainage. Stabilization device present and accurately applied. Driveline dressing changed daily per sterile technique. Extremities:  Warm and dry. No cyanosis, clubbing, rash, or edema.  Neuro:  Oriented to person, occasionally place.  Follows commands.  Left side weaker.     Telemetry: A fib 80s (personally reviewed)   Labs: Basic Metabolic Panel: Recent Labs  Lab 03/25/20 0303 03/25/20 0512 03/26/20 0356 03/26/20 0358 03/27/20 0343 03/27/20 0345 03/27/20 1541 03/27/20 1541 03/28/20 0433 03/28/20 0441 03/28/20 1500 03/29/20 0318 03/29/20 0319 03/29/20 0330  NA 130*   < > 134*   < > 138   < > 138   < > 136 137 136  --  136 137  K 3.5   < > 4.0   < > 3.5   < > 4.3   < > 4.1 4.2 4.2  --  4.4 4.4  CL 94*   < > 99   < > 103  --  102  --  102  --  100  --  102  --   CO2 18*   < > 20*   < > 24  --  22  --  24  --  22  --  24  --  GLUCOSE 178*   < > 175*   < > 129*  --  273*  --  231*  --  239*  --  206*  --   BUN 71*   < > 57*   < > 42*  --  41*  --  38*  --  36*  --  31*  --   CREATININE 3.98*   < > 3.18*   < > 2.44*  --  2.47*  --  2.41*  --  2.23*  --  2.10*  --   CALCIUM 8.4*   < > 8.4*   < > 8.3*   < > 8.4*   < > 8.2*  --  8.4*  --  8.4*  --   MG 2.3  --  2.5*  --  2.6*  --   --   --  2.6*  --   --  2.6*  --   --   PHOS 5.4*   < > 4.3   < > 2.6  --  2.5  --  2.2*  --  3.4  --  2.4*  --    < > = values in this interval not displayed.    Liver Function Tests: Recent Labs  Lab 03/22/20 1454 03/22/20 1454 03/23/20 0335 03/23/20 0335 03/23/20 1523 03/23/20 1523 03/24/20 0343 03/25/20 1532 03/27/20 0343 03/27/20 1541 03/28/20 0433 03/28/20 1500 03/29/20 0319  AST 90*  --  70*  --  64*  --  62*  --  38  --   --   --   --   ALT 27  --  24  --  24  --  26  --  25  --   --   --   --   ALKPHOS 67  --  78  --  74  --  72  --  94  --   --   --   --   BILITOT 2.4*  --  2.0*  --  2.2*  --  1.5*  --  1.3*   --   --   --   --   PROT 6.0*  --  5.9*  --  5.9*  --  6.1*  --  5.7*  --   --   --   --   ALBUMIN 3.2*   < > 3.0*   < > 2.9*   < > 3.0*   < > 2.6*  2.5* 2.7* 2.5* 2.6* 2.6*   < > = values in this interval not displayed.   No results for input(s): LIPASE, AMYLASE in the last 168 hours. Recent Labs  Lab 03/24/20 0719 03/27/20 1033  AMMONIA 32 35    CBC: Recent Labs  Lab 03/24/20 0343 03/24/20 0404 03/25/20 0303 03/25/20 0512 03/26/20 0356 03/26/20 0358 03/27/20 0343 03/27/20 0343 03/27/20 0345 03/28/20 0441 03/28/20 1013 03/29/20 0318 03/29/20 0330  WBC 16.0*   < > 11.1*  --  6.8  --  10.4  --   --   --  14.7* 13.8*  --   NEUTROABS 14.1*  --  9.2*  --  5.5  --  8.9*  --   --   --   --  11.8*  --   HGB 9.9*   < > 9.1*   < > 9.0*   < > 8.9*   < > 9.2* 9.5* 9.8* 10.2* 10.5*  HCT 30.0*   < > 27.8*   < > 26.9*   < >  27.2*   < > 27.0* 28.0* 30.4* 31.5* 31.0*  MCV 91.7   < > 90.8  --  90.9  --  90.7  --   --   --  92.7 92.4  --   PLT 158   < > 143*  --  125*  --  139*  --   --   --  158 152  --    < > = values in this interval not displayed.    Cardiac Enzymes: No results for input(s): CKTOTAL, CKMB, CKMBINDEX, TROPONINI in the last 168 hours.  BNP: BNP (last 3 results) Recent Labs    01/04/20 1642 03/21/20 0209 03/26/20 2329  BNP 165.2* 315.9* 425.7*    ProBNP (last 3 results) No results for input(s): PROBNP in the last 8760 hours.    Other results:  Imaging: CT HEAD WO CONTRAST  Result Date: 03/27/2020 CLINICAL DATA:  Post LVAD.  Ataxia, agitated EXAM: CT HEAD WITHOUT CONTRAST TECHNIQUE: Contiguous axial images were obtained from the base of the skull through the vertex without intravenous contrast. COMPARISON:  01/04/2020 FINDINGS: Brain: Large area of infarct in the posterior temporal and parietal lobes on the right, new since prior study. No hemorrhage. No hydrocephalus. Vascular: No hyperdense vessel or unexpected calcification. Skull: No acute calvarial  abnormality. Sinuses/Orbits: Visualized paranasal sinuses and mastoids clear. Orbital soft tissues unremarkable. Other: None IMPRESSION: Large posterior right MCA infarct, new since prior study. Electronically Signed   By: Rolm Baptise M.D.   On: 03/27/2020 09:08   DG CHEST PORT 1 VIEW  Result Date: 03/28/2020 CLINICAL DATA:  CHF. EXAM: PORTABLE CHEST 1 VIEW COMPARISON:  03/27/2020, 03/26/2020 chest radiograph. FINDINGS: Sequela of LVAD and left chest pacing device. Right IJ introducer sheath, right subclavian CVC terminating within the upper right atrium, bilateral chest tubes and partially imaged enteric tube terminating outside field of view are unchanged. Low lung volumes. Diffuse interstitial prominence and bilateral patchy opacities are unchanged. No pneumothorax or right pleural effusion. LVAD obscures left costophrenic sulcus. Cardiomegaly, sequela of left atrial appendage clipping and aortic atherosclerotic calcifications. Osseous structures are unchanged. IMPRESSION: Stable postsurgical appearance of the chest. Electronically Signed   By: Primitivo Gauze M.D.   On: 03/28/2020 10:04     Medications:     Scheduled Medications: . sodium chloride   Intravenous Once  . amiodarone  200 mg Per Tube BID  . aspirin  81 mg Per Tube Daily  . atorvastatin  80 mg Per Tube q1800  . B-complex with vitamin C  1 tablet Per Tube Daily  . bisacodyl  10 mg Oral Daily   Or  . bisacodyl  10 mg Rectal Daily  . chlorhexidine  15 mL Mouth Rinse BID  . Chlorhexidine Gluconate Cloth  6 each Topical Daily  . docusate  200 mg Per Tube Daily  . feeding supplement (PRO-STAT SUGAR FREE 64)  30 mL Per Tube Daily  . influenza vaccine adjuvanted  0.5 mL Intramuscular Tomorrow-1000  . insulin aspart  0-24 Units Subcutaneous Q4H  . insulin aspart  3 Units Subcutaneous Q4H  . insulin glargine  10 Units Subcutaneous Daily  . mouth rinse  15 mL Mouth Rinse q12n4p  . melatonin  3 mg Per Tube QHS  . pantoprazole  (PROTONIX) IV  40 mg Intravenous Daily  . sildenafil  20 mg Per Tube TID  . sodium chloride flush  10-40 mL Intracatheter Q12H  . sodium chloride flush  3 mL Intravenous Q12H  . warfarin  0.5 mg Oral ONCE-1600  . Warfarin - Pharmacist Dosing Inpatient   Does not apply q1600    Infusions: .  prismasol BGK 4/2.5 500 mL/hr at 03/28/20 1010  .  prismasol BGK 4/2.5 500 mL (03/29/20 0620)  . sodium chloride Stopped (03/22/20 2011)  . sodium chloride Stopped (03/27/20 1523)  . sodium chloride    . sodium chloride 20 mL/hr at 03/27/20 0500  . sodium chloride    . ceFEPime (MAXIPIME) IV Stopped (03/28/20 2212)  . dexmedetomidine (PRECEDEX) IV infusion Stopped (03/29/20 0646)  . feeding supplement (VITAL 1.5 CAL) 1,000 mL (03/29/20 0811)  . lactated ringers    . lactated ringers 10 mL/hr at 03/29/20 0700  . milrinone 0.375 mcg/kg/min (03/29/20 0733)  . norepinephrine (LEVOPHED) Adult infusion Stopped (03/21/20 1049)  . prismasol BGK 4/2.5 1,500 mL/hr at 03/29/20 0731  . sodium phosphate  Dextrose 5% IVPB 30 mmol (03/29/20 0748)  . vancomycin Stopped (03/28/20 1307)    PRN Medications: sodium chloride, Place/Maintain arterial line **AND** sodium chloride, acetaminophen, dextrose, fentaNYL (SUBLIMAZE) injection, heparin, heparin, hydrALAZINE, HYDROcodone-acetaminophen, lactated ringers, levalbuterol, LORazepam, ondansetron (ZOFRAN) IV, ondansetron (ZOFRAN) IV, oxyCODONE, sodium chloride, sodium chloride flush, sodium chloride flush, sorbitol, traMADol   Assessment/Plan:   1. Acute on chronic systolic HF -> cardiogenic shock-> S/p HM3 LVAD - Echo 2016 EF 30-35% - Echo 1/21 EF 25% - Admitted with NYHA IV symptoms and AKI with attempts at diuresis.  - Echo this admit: EF 10-15% moderate RV dysfunction - R/LHC cath 3/21 with severe 3v CAD and low output with CI 1.7.  - Swan placed. Initial co-ox 39%. Initial PAPi 1.5. Started on milrinone 0.25. - Initially planned for CABG but given need for  re-do sternotomy, relatively poor targets and longstanding low EF, VAD felt to be better option  - S/p teeth extractions by Dr. Enrique Sack. - S/p HM3 VAD 4/8 - Speed turned up on 4/11to  5400, but looking at 4/10 echo there is significant RV dysfunction with leftward septal shift so speed decreased back to 5300 rpm.  - Continue sildenafil 20 mg tid  - Nephrology consulted for CVVHD.  - LDH 474 - Last dose of coumadin was 4/11. Received IV vitamin K 4/13 and 4/15, INR 2.4 today.  Discussed with pharmacy, will give a very low dose warfarin today (0.5 mg).  - Continue ASA to 81 daily.   - Co-ox 57%, CVP 7.  With CVP down and PI events noted, will back off on CVVH, aim for 25-50 cc/hr net UF today.  - Continue milrinone 0.375 for now.   2. CAD with unstable angina - s/p previous PCI - cath 02/25/2020 with severe 3v CAD - now s/p VAD on 03/28/2020    3. AKI on CKD 3a - Nephrology consulted. Started CVVHD 4/13  - May reflect RV failure.  Renal US 4/5 unremarkable.  - Volume status improved with CVP 7, decreasing UF rate as above.   4. Permanent AF - now s/p MAZE + LAA Clipping 4/8   - can transition to amiodarone per tube for rate control.  - Warfarin as above.  5. 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 DM2, poorly controlled - hgbA1c 11.1% - continue insulin and SSI   7. ID  - 4/11 procalcitonin 1.96  - Started empiric coverage for PNA with vancomycin/cefepime for 7 days => plan to stop today, afebrile with WBCs 13.8.  8. Acute Hypoxic Respiratory Failure - Off Bipap now, on nasal cannula.  CXR today.   9. Malnutrition  - Albumin 2.9   - On tube feeds.  - Swallow study post-CVA ordered.     10. CVA - 03/27/20 CT large posterior MCA ischemic infarct, possibly from atrial fibrillation.  INR was supratherapeutic when CVA found.  Stable LDH, do not think partial pump thrombosis is the culprit. - MAP stable in  70s-80s off pressors.   - INR 2.4 today, aim to keep in therapeutic range. - Neurology following.   11. Coagulopathy - Seen by hematology, think due to cirrhosis and malabsorption of vitamin K.   CRITICAL CARE Performed by: Loralie Champagne  Total critical care time: 40 minutes  Critical care time was exclusive of separately billable procedures and treating other patients.  Critical care was necessary to treat or prevent imminent or life-threatening deterioration.  Critical care was time spent personally by me on the following activities: development of treatment plan with patient and/or surrogate as well as nursing, discussions with consultants, evaluation of patient's response to treatment, examination of patient, obtaining history from patient or surrogate, ordering and performing treatments and interventions, ordering and review of laboratory studies, ordering and review of radiographic studies, pulse oximetry and re-evaluation of patient's condition.    Length of Stay: [place]  03/29/2020, 8:32 AM  Advanced Heart Failure Team Pager [phone] (M-F; 7a - 4p)  Please contact [facility] for night-coverage after hours (4p -7a ) and weekends on [email or web address]

## 2020-03-30 ENCOUNTER — Inpatient Hospital Stay (HOSPITAL_COMMUNITY): Payer: Medicare HMO

## 2020-03-30 DIAGNOSIS — R570 Cardiogenic shock: Secondary | ICD-10-CM | POA: Diagnosis not present

## 2020-03-30 DIAGNOSIS — I255 Ischemic cardiomyopathy: Secondary | ICD-10-CM | POA: Diagnosis not present

## 2020-03-30 LAB — RENAL FUNCTION PANEL
Albumin: 2.4 g/dL — ABNORMAL LOW (ref 3.5–5.0)
Albumin: 2.3 g/dL — ABNORMAL LOW (ref 3.5–5.0)
Anion gap: 9 (ref 5–15)
Anion gap: 9 (ref 5–15)
BUN: 30 mg/dL — ABNORMAL HIGH (ref 8–23)
BUN: 31 mg/dL — ABNORMAL HIGH (ref 8–23)
CO2: 24 mmol/L (ref 22–32)
CO2: 24 mmol/L (ref 22–32)
Calcium: 8.3 mg/dL — ABNORMAL LOW (ref 8.9–10.3)
Calcium: 8.1 mg/dL — ABNORMAL LOW (ref 8.9–10.3)
Chloride: 100 mmol/L (ref 98–111)
Chloride: 102 mmol/L (ref 98–111)
Creatinine, Ser: 2.02 mg/dL — ABNORMAL HIGH (ref 0.61–1.24)
Creatinine, Ser: 2.1 mg/dL — ABNORMAL HIGH (ref 0.61–1.24)
GFR calc Af Amer: 38 mL/min — ABNORMAL LOW (ref >60)
GFR calc Af Amer: 36 mL/min — ABNORMAL LOW (ref >60)
GFR calc non Af Amer: 32 mL/min — ABNORMAL LOW (ref >60)
GFR calc non Af Amer: 31 mL/min — ABNORMAL LOW (ref >60)
Glucose, Bld: 216 mg/dL — ABNORMAL HIGH (ref 70–99)
Glucose, Bld: 273 mg/dL — ABNORMAL HIGH (ref 70–99)
Phosphorus: 2.3 mg/dL — ABNORMAL LOW (ref 2.5–4.6)
Phosphorus: 2.3 mg/dL — ABNORMAL LOW (ref 2.5–4.6)
Potassium: 4.9 mmol/L (ref 3.5–5.1)
Potassium: 5.1 mmol/L (ref 3.5–5.1)
Sodium: 133 mmol/L — ABNORMAL LOW (ref 135–145)
Sodium: 135 mmol/L (ref 135–145)

## 2020-03-30 LAB — CBC WITH DIFFERENTIAL/PLATELET
Abs Immature Granulocytes: 0.6 10*3/uL — ABNORMAL HIGH (ref 0.00–0.07)
Basophils Absolute: 0.1 10*3/uL (ref 0.0–0.1)
Basophils Relative: 0 %
Eosinophils Absolute: 0 10*3/uL (ref 0.0–0.5)
Eosinophils Relative: 0 %
HCT: 29.4 % — ABNORMAL LOW (ref 39.0–52.0)
Hemoglobin: 9.5 g/dL — ABNORMAL LOW (ref 13.0–17.0)
Immature Granulocytes: 4 %
Lymphocytes Relative: 3 %
Lymphs Abs: 0.4 10*3/uL — ABNORMAL LOW (ref 0.7–4.0)
MCH: 29.2 pg (ref 26.0–34.0)
MCHC: 32.3 g/dL (ref 30.0–36.0)
MCV: 90.5 fL (ref 80.0–100.0)
Monocytes Absolute: 1.1 10*3/uL — ABNORMAL HIGH (ref 0.1–1.0)
Monocytes Relative: 8 %
Neutro Abs: 11.6 10*3/uL — ABNORMAL HIGH (ref 1.7–7.7)
Neutrophils Relative %: 85 %
Platelets: 134 10*3/uL — ABNORMAL LOW (ref 150–400)
RBC: 3.25 MIL/uL — ABNORMAL LOW (ref 4.22–5.81)
RDW: 14.7 % (ref 11.5–15.5)
WBC: 13.7 10*3/uL — ABNORMAL HIGH (ref 4.0–10.5)
nRBC: 0.5 % — ABNORMAL HIGH (ref 0.0–0.2)

## 2020-03-30 LAB — CULTURE, BLOOD (ROUTINE X 2)
Culture: NO GROWTH
Culture: NO GROWTH
Special Requests: ADEQUATE

## 2020-03-30 LAB — POCT I-STAT 7, (LYTES, BLD GAS, ICA,H+H)
Acid-Base Excess: 2 mmol/L (ref 0.0–2.0)
Bicarbonate: 24.3 mmol/L (ref 20.0–28.0)
Calcium, Ion: 1.16 mmol/L (ref 1.15–1.40)
HCT: 40 % (ref 39.0–52.0)
Hemoglobin: 13.6 g/dL (ref 13.0–17.0)
O2 Saturation: 94 %
Patient temperature: 96.8
Potassium: 4.8 mmol/L (ref 3.5–5.1)
Sodium: 135 mmol/L (ref 135–145)
TCO2: 25 mmol/L (ref 22–32)
pCO2 arterial: 29.6 mmHg — ABNORMAL LOW (ref 32.0–48.0)
pH, Arterial: 7.519 — ABNORMAL HIGH (ref 7.350–7.450)
pO2, Arterial: 60 mmHg — ABNORMAL LOW (ref 83.0–108.0)

## 2020-03-30 LAB — GLUCOSE, CAPILLARY
Glucose-Capillary: 188 mg/dL — ABNORMAL HIGH (ref 70–99)
Glucose-Capillary: 220 mg/dL — ABNORMAL HIGH (ref 70–99)
Glucose-Capillary: 233 mg/dL — ABNORMAL HIGH (ref 70–99)
Glucose-Capillary: 246 mg/dL — ABNORMAL HIGH (ref 70–99)
Glucose-Capillary: 263 mg/dL — ABNORMAL HIGH (ref 70–99)
Glucose-Capillary: 358 mg/dL — ABNORMAL HIGH (ref 70–99)

## 2020-03-30 LAB — LACTATE DEHYDROGENASE: LDH: 428 U/L — ABNORMAL HIGH (ref 98–192)

## 2020-03-30 LAB — HEPATIC FUNCTION PANEL
ALT: 24 U/L (ref 0–44)
AST: 27 U/L (ref 15–41)
Albumin: 2.3 g/dL — ABNORMAL LOW (ref 3.5–5.0)
Alkaline Phosphatase: 119 U/L (ref 38–126)
Bilirubin, Direct: 0.3 mg/dL — ABNORMAL HIGH (ref 0.0–0.2)
Indirect Bilirubin: 0.7 mg/dL (ref 0.3–0.9)
Total Bilirubin: 1 mg/dL (ref 0.3–1.2)
Total Protein: 5.6 g/dL — ABNORMAL LOW (ref 6.5–8.1)

## 2020-03-30 LAB — COOXEMETRY PANEL
Carboxyhemoglobin: 1.3 % (ref 0.5–1.5)
Carboxyhemoglobin: 1.5 % (ref 0.5–1.5)
Methemoglobin: 0.4 % (ref 0.0–1.5)
Methemoglobin: 0.4 % (ref 0.0–1.5)
O2 Saturation: 51.6 %
O2 Saturation: 67.6 %
Total hemoglobin: 10.9 g/dL — ABNORMAL LOW (ref 12.0–16.0)
Total hemoglobin: 9.1 g/dL — ABNORMAL LOW (ref 12.0–16.0)

## 2020-03-30 LAB — PROTIME-INR
INR: 2.2 — ABNORMAL HIGH (ref 0.8–1.2)
Prothrombin Time: 24.3 seconds — ABNORMAL HIGH (ref 11.4–15.2)

## 2020-03-30 LAB — MAGNESIUM: Magnesium: 2.4 mg/dL (ref 1.7–2.4)

## 2020-03-30 MED ORDER — WARFARIN 0.5 MG HALF TABLET
0.5000 mg | ORAL_TABLET | Freq: Once | ORAL | Status: AC
  Administered 2020-03-30: 17:00:00 0.5 mg via ORAL
  Filled 2020-03-30: qty 1

## 2020-03-30 MED ORDER — QUETIAPINE FUMARATE 25 MG PO TABS
25.0000 mg | ORAL_TABLET | Freq: Every day | ORAL | Status: DC
  Administered 2020-03-30 – 2020-03-31 (×2): 25 mg via ORAL
  Filled 2020-03-30 (×2): qty 1

## 2020-03-30 MED ORDER — INSULIN GLARGINE 100 UNIT/ML ~~LOC~~ SOLN
24.0000 [IU] | Freq: Two times a day (BID) | SUBCUTANEOUS | Status: DC
  Administered 2020-03-30 – 2020-05-02 (×59): 24 [IU] via SUBCUTANEOUS
  Filled 2020-03-30 (×69): qty 0.24

## 2020-03-30 MED ORDER — MILRINONE LACTATE IN DEXTROSE 20-5 MG/100ML-% IV SOLN
0.1250 ug/kg/min | INTRAVENOUS | Status: DC
  Administered 2020-03-30 – 2020-04-07 (×15): 0.25 ug/kg/min via INTRAVENOUS
  Administered 2020-04-08: 0.125 ug/kg/min via INTRAVENOUS
  Filled 2020-03-30 (×16): qty 100

## 2020-03-30 NOTE — Progress Notes (Signed)
10 Days Post-Op Procedure(s) (LRB): REDO STERNOTOMY WITH STERNAL PLATING (N/A) INSERTION OF IMPLANTABLE LEFT VENTRICULAR ASSIST DEVICE - HM3 (N/A) MAZE (N/A) TRANSESOPHAGEAL ECHOCARDIOGRAM (TEE) (N/A) Clipping Of Atrial Appendage (Left) Subjective: Patient thirsty wants to drink Pulmonary status stable Rate controlled atrial fibrillation LVAD parameters satisfactory Chest x-ray today clear  Objective: Vital signs in last 24 hours: Temp:  [96 F (35.6 C)-97.8 F (36.6 C)] 97.8 F (36.6 C) (04/18 1200) Pulse Rate:  [30-181] 89 (04/18 1200) Cardiac Rhythm: Atrial fibrillation (04/18 1200) Resp:  [11-21] 20 (04/18 1200) BP: (63-111)/(26-78) 111/70 (04/18 1200) SpO2:  [88 %-96 %] 94 % (04/18 1200) Arterial Line BP: (63-94)/(45-73) 92/71 (04/18 1200) Weight:  [101.4 kg] 101.4 kg (04/18 0500)  Hemodynamic parameters for last 24 hours: CVP:  [10 mmHg-13 mmHg] 10 mmHg  Intake/Output from previous day: 04/17 0701 - 04/18 0700 In: 2450.8 [P.O.:120; I.V.:540.9; NG/GT:1530; IV Piggyback:259.9] Out: 2038  Intake/Output this shift: Total I/O In: 394.6 [I.V.:94.6; NG/GT:300] Out: 364 [Other:364]       Physical Exam  General: Elderly male in bed on CRT breathing comfortably HEENT: Normocephalic pupils equal , dentition adequate Neck: Supple without JVD, adenopathy, or bruit Chest: Clear to auscultation, symmetrical breath sounds, no rhonchi, no tenderness             or deformity Cardiovascular: Regular rate and rhythm, normal VAD hum no gallop, peripheral perfusion good             Abdomen:  Soft, nontender, no palpable mass or organomegaly Extremities: Warm, well-perfused, no clubbing cyanosis edema or tenderness,              no venous stasis changes of the legs Rectal/GU: Deferred Neuro: Grossly non--focal and symmetrical throughout Skin: Clean and dry without rash or ulceration  Lab Results: Recent Labs    03/29/20 0318 03/29/20 0330 03/30/20 0324 03/30/20 0340  WBC  13.8*  --  13.7*  --   HGB 10.2*   < > 9.5* 13.6  HCT 31.5*   < > 29.4* 40.0  PLT 152  --  134*  --    < > = values in this interval not displayed.   BMET:  Recent Labs    03/29/20 1720 03/29/20 1720 03/30/20 0340 03/30/20 0730  NA 134*   < > 135 133*  K 4.6   < > 4.8 4.9  CL 101  --   --  100  CO2 23  --   --  24  GLUCOSE 320*  --   --  216*  BUN 33*  --   --  30*  CREATININE 2.02*  --   --  2.02*  CALCIUM 8.2*  --   --  8.3*   < > = values in this interval not displayed.    PT/INR:  Recent Labs    03/30/20 0324  LABPROT 24.3*  INR 2.2*   ABG    Component Value Date/Time   PHART 7.519 (H) 03/30/2020 0340   HCO3 24.3 03/30/2020 0340   TCO2 25 03/30/2020 0340   ACIDBASEDEF 2.0 03/26/2020 0358   O2SAT 67.6 03/30/2020 1130   CBG (last 3)  Recent Labs    03/30/20 0334 03/30/20 0800 03/30/20 1135  GLUCAP 263* 188* 246*    Assessment/Plan: S/P Procedure(s) (LRB): REDO STERNOTOMY WITH STERNAL PLATING (N/A) INSERTION OF IMPLANTABLE LEFT VENTRICULAR ASSIST DEVICE - HM3 (N/A) MAZE (N/A) TRANSESOPHAGEAL ECHOCARDIOGRAM (TEE) (N/A) Clipping Of Atrial Appendage (Left) Continue to remove fluid with CRRT Swallow study Coumadin  per pharmacy  LOS: 18 days    Tharon Aquas Trigt III 03/30/2020

## 2020-03-30 NOTE — Progress Notes (Signed)
Patient ID: [name] [name], male   DOB: Oct 07, 1951, 69 y.o.   MRN: [id number]    Advanced Heart Failure Rounding Note   Subjective:    - S/p HM3 VAD 4/8 w MAZE procedure + LAA clipping. - Extubated 4/9 - 4/12 LVAD speed increased to 5400 --> echo moderate-severe RV dysfunction, septum mildly left shifted, trivial pericardial effusion.  LVAD speed cut back to 5300.  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   Remains on milrinone 0.375. NE off. MAPs ~70. Co-ox 68%  Remains anuric on CVVHD pulling 50-100. CVP 10   Awake but gets agitated so remains on Precedex 0.2. Can follow commands and tell me where he is. Left side mildly weak.   INR 2.2  LDH 467 => 453 => 454 => 474 => 428  Afebrile (Tmax 97.8). Cefepime stopped 4/17 after 7 days   VAD Interrogation  Flow 4.7, Speed 5300, Power 3.4, Pulse Index 3.7   VAD interrogated personally. Parameters stable.   Objective:   Weight Range:  Vital Signs:   Temp:  [96 F (35.6 C)-97.8 F (36.6 C)] 97.8 F (36.6 C) (04/18 1200) Pulse Rate:  [30-181] 89 (04/18 1200) Resp:  [11-21] 20 (04/18 1200) BP: (63-111)/(26-78) 111/70 (04/18 1200) SpO2:  [88 %-96 %] 94 % (04/18 1200) Arterial Line BP: (63-94)/(45-73) 92/71 (04/18 1200) Weight:  [101.4 kg] 101.4 kg (04/18 0500) Last BM Date: 03/27/20  Weight change: Filed Weights   03/28/20 0400 03/29/20 0600 03/30/20 0500  Weight: 107.9 kg 99.8 kg 101.4 kg    Intake/Output:   Intake/Output Summary (Last 24 hours) at 03/30/2020 1211 Last data filed at 03/30/2020 1200 Gross per 24 hour  Intake 2132.39 ml  Output 1507 ml  Net 625.39 ml       Physical Exam: CVP 10 General: Lying in bed NAD HEENT: normal  + cor- trak Neck: supple. RIJ introducer  Carotids 2+ bilat; no bruits. No lymphadenopathy or thryomegaly appreciated. Cor: LVAD hum. Sternotomy incision ok.  R [place] HD cath Lungs: Clear. Abdomen:  soft, nontender, non-distended. No hepatosplenomegaly. No bruits or  masses. Good bowel sounds. Driveline site clean. Anchor in place.  Extremities: no cyanosis, clubbing, rash. Warm no edema  Neuro: alert & oriented x 2. Seems slightly weak on left    Telemetry: A fib 80-90s Personally reviewed   Labs: Basic Metabolic Panel: Recent Labs  Lab 03/26/20 0356 03/26/20 0358 03/27/20 0343 03/27/20 0345 03/28/20 0433 03/28/20 0441 03/28/20 1500 03/28/20 1500 03/29/20 0318 03/29/20 0319 03/29/20 0330 03/29/20 1720 03/30/20 0324 03/30/20 0340 03/30/20 0730  NA 134*   < > 138   < > 136   < > 136   < >  --  136 137 134*  --  135 133*  K 4.0   < > 3.5   < > 4.1   < > 4.2   < >  --  4.4 4.4 4.6  --  4.8 4.9  CL 99   < > 103   < > 102  --  100  --   --  102  --  101  --   --  100  CO2 20*   < > 24   < > 24  --  22  --   --  24  --  23  --   --  24  GLUCOSE 175*   < > 129*   < > 231*  --  239*  --   --  206*  --  320*  --   --  216*  BUN 57*   < > 42*   < > 38*  --  36*  --   --  31*  --  33*  --   --  30*  CREATININE 3.18*   < > 2.44*   < > 2.41*  --  2.23*  --   --  2.10*  --  2.02*  --   --  2.02*  CALCIUM 8.4*   < > 8.3*   < > 8.2*   < > 8.4*   < >  --  8.4*  --  8.2*  --   --  8.3*  MG 2.5*  --  2.6*  --  2.6*  --   --   --  2.6*  --   --   --  2.4  --   --   PHOS 4.3   < > 2.6   < > 2.2*  --  3.4  --   --  2.4*  --  3.2  --   --  2.3*   < > = values in this interval not displayed.    Liver Function Tests: Recent Labs  Lab 03/23/20 1523 03/23/20 1523 03/24/20 0343 03/25/20 1532 03/27/20 0343 03/27/20 1541 03/29/20 0319 03/29/20 1050 03/29/20 1720 03/30/20 0324 03/30/20 0730  AST 64*  --  62*  --  38  --   --  31  --  27  --   ALT 24  --  26  --  25  --   --  25  --  24  --   ALKPHOS 74  --  72  --  94  --   --  124  --  119  --   BILITOT 2.2*  --  1.5*  --  1.3*  --   --  1.3*  --  1.0  --   PROT 5.9*  --  6.1*  --  5.7*  --   --  6.2*  --  5.6*  --   ALBUMIN 2.9*   < > 3.0*   < > 2.6*  2.5*   < > 2.6* 2.5* 2.4* 2.3* 2.4*   < > =  values in this interval not displayed.   No results for input(s): LIPASE, AMYLASE in the last 168 hours. Recent Labs  Lab 03/24/20 0719 03/27/20 1033  AMMONIA 32 35    CBC: Recent Labs  Lab 03/25/20 0303 03/25/20 0512 03/26/20 0356 03/26/20 0358 03/27/20 0343 03/27/20 0345 03/28/20 1013 03/29/20 0318 03/29/20 0330 03/30/20 0324 03/30/20 0340  WBC 11.1*   < > 6.8  --  10.4  --  14.7* 13.8*  --  13.7*  --   NEUTROABS 9.2*  --  5.5  --  8.9*  --   --  11.8*  --  11.6*  --   HGB 9.1*   < > 9.0*   < > 8.9*   < > 9.8* 10.2* 10.5* 9.5* 13.6  HCT 27.8*   < > 26.9*   < > 27.2*   < > 30.4* 31.5* 31.0* 29.4* 40.0  MCV 90.8   < > 90.9  --  90.7  --  92.7 92.4  --  90.5  --   PLT 143*   < > 125*  --  139*  --  158 152  --  134*  --    < > = values in this interval  not displayed.    Cardiac Enzymes: No results for input(s): CKTOTAL, CKMB, CKMBINDEX, TROPONINI in the last 168 hours.  BNP: BNP (last 3 results) Recent Labs    01/04/20 1642 03/21/20 0209 03/26/20 2329  BNP 165.2* 315.9* 425.7*    ProBNP (last 3 results) No results for input(s): PROBNP in the last 8760 hours.    Other results:  Imaging: DG Chest Port 1 View  Result Date: 03/30/2020 CLINICAL DATA:  Shortness of breath and chest pain. LVAD in place. EXAM: PORTABLE CHEST 1 VIEW COMPARISON:  Yesterday. FINDINGS: Stable right jugular catheter sheath. Stable right subclavian catheter its tip at the superior cavoatrial junction. Feeding tube extending into the stomach. Stable LVAD. Stable left subclavian pacer and AICD leads. Mildly decreased left basilar atelectasis and possible pneumonia. Resolved mild right basilar atelectasis. Stable prominence of the pulmonary vasculature and interstitial markings with resolved mild superimposed airspace opacities. Stable enlarged cardiac silhouette. Moderate left glenohumeral joint degenerative changes. IMPRESSION: 1. Mildly decreased left basilar atelectasis and possible  pneumonia. 2. Resolved mild right basilar atelectasis. 3. Improving changes of congestive heart failure with stable cardiomegaly. Electronically Signed   By: Claudie Revering M.D.   On: 03/30/2020 10:30   DG CHEST PORT 1 VIEW  Result Date: 03/29/2020 CLINICAL DATA:  Left ventricular assist device. EXAM: PORTABLE CHEST 1 VIEW COMPARISON:  March 28, 2020. FINDINGS: Stable cardiomegaly. Feeding tube is seen entering stomach. Left ventricular assist device is unchanged in position. Right subclavian catheter is noted. Left-sided pacemaker is unchanged. No pneumothorax is noted. Stable bibasilar atelectasis is noted. Bony thorax is unremarkable. IMPRESSION: Stable support apparatus. Stable bibasilar atelectasis. No pneumothorax is noted. Electronically Signed   By: Marijo Conception M.D.   On: 03/29/2020 10:23     Medications:     Scheduled Medications: . sodium chloride   Intravenous Once  . amiodarone  200 mg Per Tube BID  . aspirin  81 mg Per Tube Daily  . atorvastatin  80 mg Per Tube q1800  . B-complex with vitamin C  1 tablet Per Tube Daily  . bisacodyl  10 mg Oral Daily   Or  . bisacodyl  10 mg Rectal Daily  . chlorhexidine  15 mL Mouth Rinse BID  . Chlorhexidine Gluconate Cloth  6 each Topical Daily  . docusate  200 mg Per Tube Daily  . feeding supplement (PRO-STAT SUGAR FREE 64)  30 mL Per Tube Daily  . influenza vaccine adjuvanted  0.5 mL Intramuscular Tomorrow-1000  . insulin aspart  0-24 Units Subcutaneous Q4H  . insulin aspart  3 Units Subcutaneous Q4H  . insulin glargine  18 Units Subcutaneous BID  . mouth rinse  15 mL Mouth Rinse q12n4p  . melatonin  3 mg Per Tube QHS  . pantoprazole (PROTONIX) IV  40 mg Intravenous Daily  . sildenafil  20 mg Per Tube TID  . sodium chloride flush  10-40 mL Intracatheter Q12H  . warfarin  0.5 mg Oral ONCE-1600  . Warfarin - Pharmacist Dosing Inpatient   Does not apply q1600    Infusions: .  prismasol BGK 4/2.5 500 mL (03/30/20 0246)  .   prismasol BGK 4/2.5 500 mL (03/29/20 0620)  . sodium chloride Stopped (03/22/20 2011)  . sodium chloride Stopped (03/27/20 1523)  . sodium chloride    . sodium chloride 20 mL/hr at 03/27/20 0500  . sodium chloride    . dexmedetomidine (PRECEDEX) IV infusion Stopped (03/30/20 0840)  . feeding supplement (VITAL 1.5 CAL) 60 mL/hr  at 03/30/20 1200  . lactated ringers Stopped (03/29/20 0748)  . milrinone 0.375 mcg/kg/min (03/30/20 0900)  . norepinephrine (LEVOPHED) Adult infusion Stopped (03/21/20 1049)  . prismasol BGK 4/2.5 1,500 mL/hr (03/30/20 0625)    PRN Medications: sodium chloride, Place/Maintain arterial line **AND** sodium chloride, acetaminophen, dextrose, fentaNYL (SUBLIMAZE) injection, heparin, heparin, hydrALAZINE, HYDROcodone-acetaminophen, levalbuterol, LORazepam, ondansetron (ZOFRAN) IV, ondansetron (ZOFRAN) IV, oxyCODONE, sodium chloride, sodium chloride flush, sorbitol, traMADol   Assessment/Plan:   1. Acute on chronic systolic HF -> cardiogenic shock-> S/p HM3 LVAD - Echo 2016 EF 30-35% - Echo 1/21 EF 25% - Admitted with NYHA IV symptoms and AKI with attempts at diuresis.  - Echo this admit: EF 10-15% moderate RV dysfunction - R/LHC cath 3/21 with severe 3v CAD and low output with CI 1.7.  - Swan placed. Initial co-ox 39%. Initial PAPi 1.5. Started on milrinone 0.25. - Initially planned for CABG but given need for re-do sternotomy, relatively poor targets and longstanding low EF, VAD felt to be better option  - S/p HM3 VAD 4/8 - Speed turned up on 4/11to  5400, but looking at 4/10 echo there is significant RV dysfunction with leftward septal shift so speed decreased back to 5300 rpm.  - Continue sildenafil 20 mg tid  - Remains on milrinone 0.375. Co-ox 68% Will drop milrinone to 0.25 - CVP 8-10. Continue CVVHD. Weight at baseline. Run even  2. VAD - VAD interrogated personally. Parameters stable. - LDH stable at 428  - INR  2.2 Personally reviewed - Driveline ok     3. CAD with unstable angina - s/p previous PCI - cath 03/11/2020 with severe 3v CAD - now s/p VAD on 03/26/2020    4. AKI on CKD 3a - Nephrology consulted. Started CVVHD 4/13  - May reflect RV failure.  Renal US 4/5 unremarkable.  - CVP 8-10. Weight below baseline. Run CVVHD even for now Keep CVP 8-10 - Hopefully renal function will recover  5. Permanent AF - now s/p MAZE + LAA Clipping 4/8   - continue  amiodarone per tube for rate control.  - Warfarin as above.  6. MVP s/p MV repair - On admit with recurrent severe MR on echo and with huge v-waves on PCWP tracing - s/p VAD  7. DM2, poorly controlled - hgbA1c 11.1% - continue insulin and SSI   8. ID  - 4/11 procalcitonin 1.96  - Started empiric coverage for PNA with vancomycin/cefepime for 7 days. Stopped 4/17  9. Acute Hypoxic Respiratory Failure - Off Bipap now, on nasal cannula.   - CXR stable  10. Malnutrition  - Albumin 2.9   - On tube feeds.  - Passed swallow study   11. CVA - 03/27/20 CT large posterior MCA ischemic infarct, possibly from atrial fibrillation.  INR was supratherapeutic when CVA found.  Stable LDH, do not think partial pump thrombosis is the culprit. - MAP stable in 70s-80s off pressors.   - INR 2.2 today, aim to keep in therapeutic range. - Neurology following.  - Seems to be making progress.  - Hopefully will qualify for CIR on d/c  12. Coagulopathy - Seen by hematology, think due to cirrhosis and malabsorption of vitamin K.  - Improved  13. Agitation - continue to wean precedex as tolerated - will add low dose seroquel qhs  CRITICAL CARE Performed by: Glori Bickers  Total critical care time: 35 minutes  Critical care time was exclusive of separately billable procedures and treating other patients.  Critical care  was necessary to treat or prevent imminent or life-threatening deterioration.  Critical care was time spent personally by me on the following activities:  development of treatment plan with patient and/or surrogate as well as nursing, discussions with consultants, evaluation of patient's response to treatment, examination of patient, obtaining history from patient or surrogate, ordering and performing treatments and interventions, ordering and review of laboratory studies, ordering and review of radiographic studies, pulse oximetry and re-evaluation of patient's condition.    Length of Stay: [place]  03/30/2020, 12:11 PM  Advanced Heart Failure Team Pager [phone] (M-F; 7a - 4p)  Please contact [facility] for night-coverage after hours (4p -7a ) and weekends on [email or web address]

## 2020-03-30 NOTE — Procedures (Signed)
Admit: 02/23/2020 LOS: 68  41M dialysis dependent AKI on CRRT, acute CHF exacerbation s/p VAD, status post MV repair, AFib s/p MAZE and LAA clipping 4/8; R MCA CVA 4/15  Current CRRT Prescription: Start Date: 4/13 Catheter: L Kamas Temp HD cath BFR: 200 Pre Blood Pump: 500 4K DFR: 1500 4K Replacement Rate: 200 4K Goal UF: net even Anticoagulation: systemic anticoagulation / warfarin Clotting: acceptable   S:  Stable, tolerating UF  K 4.9, P 2.3  nO uop  O: 04/17 0701 - 04/18 0700 In: 2450.8 [P.O.:120; I.V.:540.9; NG/GT:1530; IV Piggyback:259.9] Out: 2038   Filed Weights   03/28/20 0400 03/29/20 0600 03/30/20 0500  Weight: 107.9 kg 99.8 kg 101.4 kg    Recent Labs  Lab 03/28/20 1500 03/28/20 1500 03/29/20 0319 03/29/20 0319 03/29/20 0330 03/29/20 1720 03/30/20 0340  NA 136   < > 136   < > 137 134* 135  K 4.2   < > 4.4   < > 4.4 4.6 4.8  CL 100  --  102  --   --  101  --   CO2 22  --  24  --   --  23  --   GLUCOSE 239*  --  206*  --   --  320*  --   BUN 36*  --  31*  --   --  33*  --   CREATININE 2.23*  --  2.10*  --   --  2.02*  --   CALCIUM 8.4*  --  8.4*  --   --  8.2*  --   PHOS 3.4  --  2.4*  --   --  3.2  --    < > = values in this interval not displayed.   Recent Labs  Lab 03/27/20 0343 03/27/20 0345 03/28/20 1013 03/28/20 1013 03/29/20 0318 03/29/20 0318 03/29/20 0330 03/30/20 0324 03/30/20 0340  WBC 10.4   < > 14.7*  --  13.8*  --   --  13.7*  --   NEUTROABS 8.9*  --   --   --  11.8*  --   --  11.6*  --   HGB 8.9*   < > 9.8*   < > 10.2*   < > 10.5* 9.5* 13.6  HCT 27.2*   < > 30.4*   < > 31.5*   < > 31.0* 29.4* 40.0  MCV 90.7   < > 92.7  --  92.4  --   --  90.5  --   PLT 139*   < > 158  --  152  --   --  134*  --    < > = values in this interval not displayed.    Scheduled Meds: . sodium chloride   Intravenous Once  . amiodarone  200 mg Per Tube BID  . aspirin  81 mg Per Tube Daily  . atorvastatin  80 mg Per Tube q1800  . B-complex with  vitamin C  1 tablet Per Tube Daily  . bisacodyl  10 mg Oral Daily   Or  . bisacodyl  10 mg Rectal Daily  . chlorhexidine  15 mL Mouth Rinse BID  . Chlorhexidine Gluconate Cloth  6 each Topical Daily  . docusate  200 mg Per Tube Daily  . feeding supplement (PRO-STAT SUGAR FREE 64)  30 mL Per Tube Daily  . influenza vaccine adjuvanted  0.5 mL Intramuscular Tomorrow-1000  . insulin aspart  0-24 Units Subcutaneous Q4H  . insulin  aspart  3 Units Subcutaneous Q4H  . insulin glargine  18 Units Subcutaneous BID  . mouth rinse  15 mL Mouth Rinse q12n4p  . melatonin  3 mg Per Tube QHS  . pantoprazole (PROTONIX) IV  40 mg Intravenous Daily  . sildenafil  20 mg Per Tube TID  . sodium chloride flush  10-40 mL Intracatheter Q12H  . Warfarin - Pharmacist Dosing Inpatient   Does not apply q1600   Continuous Infusions: .  prismasol BGK 4/2.5 500 mL (03/30/20 0246)  .  prismasol BGK 4/2.5 500 mL (03/29/20 0620)  . sodium chloride Stopped (03/22/20 2011)  . sodium chloride Stopped (03/27/20 1523)  . sodium chloride    . sodium chloride 20 mL/hr at 03/27/20 0500  . sodium chloride    . dexmedetomidine (PRECEDEX) IV infusion 0.1 mcg/kg/hr (03/30/20 0700)  . feeding supplement (VITAL 1.5 CAL) 60 mL/hr at 03/30/20 0700  . lactated ringers Stopped (03/29/20 0748)  . milrinone 0.375 mcg/kg/min (03/30/20 0700)  . norepinephrine (LEVOPHED) Adult infusion Stopped (03/21/20 1049)  . prismasol BGK 4/2.5 1,500 mL/hr (03/30/20 0625)   PRN Meds:.sodium chloride, Place/Maintain arterial line **AND** sodium chloride, acetaminophen, dextrose, fentaNYL (SUBLIMAZE) injection, heparin, heparin, hydrALAZINE, HYDROcodone-acetaminophen, levalbuterol, LORazepam, ondansetron (ZOFRAN) IV, ondansetron (ZOFRAN) IV, oxyCODONE, sodium chloride, sodium chloride flush, sorbitol, traMADol  ABG    Component Value Date/Time   PHART 7.519 (H) 03/30/2020 0340   PCO2ART 29.6 (L) 03/30/2020 0340   PO2ART 60.0 (L) 03/30/2020 0340    HCO3 24.3 03/30/2020 0340   TCO2 25 03/30/2020 0340   ACIDBASEDEF 2.0 03/26/2020 0358   O2SAT 94.0 03/30/2020 0340    A/P  1. Anuric Dialysis dependent AKI on CRRT; all 4K, no circuit anticoagluation 2. Hypophosphatemia 3. sCHF s/p VAD 4/8 4. AFib s/p MAZE and LAA clipping 4/8 5. DM2 6. R MCA ischemic CVA  Stable on CRRT, cont.    Pearson Grippe, MD [facility]

## 2020-03-30 NOTE — Progress Notes (Signed)
ANTICOAGULATION CONSULT NOTE  Pharmacy Consult for warfarin Indication: post op LVAD  / Aflutter  Allergies  Allergen Reactions  . Liraglutide Nausea And Vomiting  . Lisinopril Cough    Patient Measurements: Height: 6' (182.9 cm) Weight: 101.4 kg (223 lb 8.7 oz)(Head pillow and pad only on bed, lifting controller) IBW/kg (Calculated) : 77.6 Heparin Dosing Weight: 101.2 kg  Vital Signs: Temp: 97.8 F (36.6 C) (04/18 0800) Temp Source: Axillary (04/18 0800) BP: 83/59 (04/18 0900) Pulse Rate: 131 (04/18 0900)  Labs: Recent Labs    03/28/20 0433 03/28/20 0441 03/28/20 1013 03/28/20 1500 03/29/20 0318 03/29/20 0318 03/29/20 0319 03/29/20 0330 03/29/20 0330 03/29/20 1720 03/30/20 0324 03/30/20 0340 03/30/20 0730  HGB  --    < > 9.8*  --  10.2*   < >  --  10.5*   < >  --  9.5* 13.6  --   HCT  --    < > 30.4*  --  31.5*   < >  --  31.0*  --   --  29.4* 40.0  --   PLT  --   --  158  --  152  --   --   --   --   --  134*  --   --   LABPROT 24.1*  --   --   --  26.0*  --   --   --   --   --  24.3*  --   --   INR 2.2*  --   --   --  2.4*  --   --   --   --   --  2.2*  --   --   CREATININE 2.41*  --   --    < >  --   --  2.10*  --   --  2.02*  --   --  2.02*   < > = values in this interval not displayed.    Estimated Creatinine Clearance: 41.9 mL/min (A) (by C-G formula based on SCr of 2.02 mg/dL (H)).   Medical History: Past Medical History:  Diagnosis Date  . Anxiety   . Arthritis   . CAD (coronary artery disease)    a.  1993 s/p MI - [employer];  b. s/p BMS to LAD '00;  c. PTCA 2nd diagonal 2010;  d. 02/18/12 Cath: moderate nonobs dzs - med rx;  e.  01/2015 Cath: LM nl, LAD 40-72m ISR, 70-41m/d, d1 90p (3.0x16 Synergy DES), D2 50-60, LCX nl, OM1 50p, 57m (2.5x12 Synergy DES), RCA nl, EF 30-35%.  . Chronic combined systolic and diastolic CHF (congestive heart failure) ([place])    a. 12/2014 Echo: EF 30-35%, Gr2 DD, mod MR, sev dil LA.  . CKD (chronic kidney disease),  stage III   . Depression   . ED (erectile dysfunction)   . GERD (gastroesophageal reflux disease)   . Hyperlipidemia   . Hypertension   . Ischemic cardiomyopathy    a. s/p St. [name] (Atlas) ICD implanted in [place] 2007;  b. 12/2014 Echo: Ef 30-35%.  . MVP (mitral valve prolapse)    a. s/p MV annuloplasty at [facility] 2004.  Marland Kitchen Persistent atrial fibrillation ([place])    a. noted on ICD interrogation '10 - not previously on [place] - CHA2DS2VASc = 5.  . Type II diabetes mellitus ([place])    uncontrolled    Medications:  Medications Prior to Admission  Medication Sig Dispense Refill Last Dose  . ALPRAZolam (  XANAX) 0.25 MG tablet Take 0.25 mg by mouth 3 (three) times daily as needed for anxiety.    unknown  . amiodarone (PACERONE) 200 MG tablet Take 200 mg by mouth daily.    03/11/2020 at Unknown time  . apixaban (ELIQUIS) 5 MG TABS tablet Take 1 tablet (5 mg total) by mouth 2 (two) times daily. 60 tablet 1 03/11/2020 at 0600  . atorvastatin (LIPITOR) 40 MG tablet Take 1 tablet (40 mg total) by mouth daily at 6 PM. 30 tablet 0 03/10/2020 at Unknown time  . citalopram (CELEXA) 20 MG tablet Take 20 mg by mouth daily.    03/11/2020 at Unknown time  . furosemide (LASIX) 40 MG tablet Take 40 mg by mouth daily.    03/11/2020 at Unknown time  . insulin aspart (NOVOLOG FLEXPEN) 100 UNIT/ML FlexPen Inject 6 Units into the skin 3 (three) times daily with meals. 15 mL 0 03/11/2020 at Unknown time  . Insulin Glargine (BASAGLAR KWIKPEN) 100 UNIT/ML SOPN Inject 0.5 mLs (50 Units total) into the skin daily. 15 mL 1 03/11/2020 at Unknown time  . metoprolol succinate (TOPROL-XL) 100 MG 24 hr tablet Take 1 tablet (100 mg total) by mouth daily. 90 tablet 3 03/11/2020 at 0600  . nitroGLYCERIN (NITROSTAT) 0.4 MG SL tablet Place 1 tablet (0.4 mg total) under the tongue every 5 (five) minutes x 3 doses as needed for chest pain. 10 tablet 0 unknown  . pantoprazole (PROTONIX) 40 MG tablet Take 1 tablet (40 mg total) by mouth  daily. 30 tablet 1 03/11/2020 at Unknown time  . potassium chloride SA (KLOR-CON) 20 MEQ tablet Take 20 mEq by mouth daily.    03/11/2020 at Unknown time  . tamsulosin (FLOMAX) 0.4 MG CAPS capsule Take 1 capsule (0.4 mg total) by mouth daily. 30 capsule 0 03/11/2020 at Unknown time  . ALPRAZolam (XANAX) 1 MG tablet Take 1 tablet (1 mg total) by mouth 2 (two) times daily as needed for anxiety. (Patient not taking: Reported on 03/05/2020) 10 tablet 0 Not Taking at Unknown time    Assessment: 44 YOM with atrial fibrillation on Eliquis PTA s/p R & L heart cath found to have 3v disease s/p  LVAD  Implant 4/8. Warfarin initiated on 4/9 after discussing with MD.  CT on 4/15 now found to have large right MCA stroke. INR had been elevated up to 9.0 postop, he received 1 dose of vitamin K 1 mg IV on 4/13 and again on 4/15 with INR still elevated at 6.2.   Warfarin resumed cautiously on 4/17, INR therapeutic at 2.2 today. H/H and LDH stable.  Goal of Therapy:  INR 2-2.5 Heparin level 0.3-0.7 units/ml Monitor platelets by anticoagulation protocol: Yes   Plan:  -Warfarin 0.5mg  PO x1 again tonight -Daily INR   Arrie Senate, PharmD, BCPS Clinical Pharmacist [phone] Please check AMION for all [place] numbers 03/30/2020

## 2020-03-31 ENCOUNTER — Inpatient Hospital Stay (HOSPITAL_COMMUNITY): Payer: Medicare HMO

## 2020-03-31 ENCOUNTER — Inpatient Hospital Stay: Payer: Self-pay

## 2020-03-31 DIAGNOSIS — I255 Ischemic cardiomyopathy: Secondary | ICD-10-CM | POA: Diagnosis not present

## 2020-03-31 DIAGNOSIS — R570 Cardiogenic shock: Secondary | ICD-10-CM | POA: Diagnosis not present

## 2020-03-31 LAB — GLUCOSE, CAPILLARY
Glucose-Capillary: 119 mg/dL — ABNORMAL HIGH (ref 70–99)
Glucose-Capillary: 119 mg/dL — ABNORMAL HIGH (ref 70–99)
Glucose-Capillary: 179 mg/dL — ABNORMAL HIGH (ref 70–99)
Glucose-Capillary: 351 mg/dL — ABNORMAL HIGH (ref 70–99)
Glucose-Capillary: 209 mg/dL — ABNORMAL HIGH (ref 70–99)
Glucose-Capillary: 237 mg/dL — ABNORMAL HIGH (ref 70–99)

## 2020-03-31 LAB — RENAL FUNCTION PANEL
Albumin: 2.2 g/dL — ABNORMAL LOW (ref 3.5–5.0)
Albumin: 2.4 g/dL — ABNORMAL LOW (ref 3.5–5.0)
Anion gap: 8 (ref 5–15)
Anion gap: 10 (ref 5–15)
BUN: 29 mg/dL — ABNORMAL HIGH (ref 8–23)
BUN: 30 mg/dL — ABNORMAL HIGH (ref 8–23)
CO2: 26 mmol/L (ref 22–32)
CO2: 25 mmol/L (ref 22–32)
Calcium: 8.3 mg/dL — ABNORMAL LOW (ref 8.9–10.3)
Calcium: 8 mg/dL — ABNORMAL LOW (ref 8.9–10.3)
Chloride: 100 mmol/L (ref 98–111)
Chloride: 95 mmol/L — ABNORMAL LOW (ref 98–111)
Creatinine, Ser: 1.96 mg/dL — ABNORMAL HIGH (ref 0.61–1.24)
Creatinine, Ser: 1.98 mg/dL — ABNORMAL HIGH (ref 0.61–1.24)
GFR calc Af Amer: 39 mL/min — ABNORMAL LOW (ref >60)
GFR calc Af Amer: 39 mL/min — ABNORMAL LOW (ref >60)
GFR calc non Af Amer: 34 mL/min — ABNORMAL LOW (ref >60)
GFR calc non Af Amer: 33 mL/min — ABNORMAL LOW (ref >60)
Glucose, Bld: 130 mg/dL — ABNORMAL HIGH (ref 70–99)
Glucose, Bld: 351 mg/dL — ABNORMAL HIGH (ref 70–99)
Phosphorus: 2.2 mg/dL — ABNORMAL LOW (ref 2.5–4.6)
Phosphorus: 4.4 mg/dL (ref 2.5–4.6)
Potassium: 4.9 mmol/L (ref 3.5–5.1)
Potassium: 5.1 mmol/L (ref 3.5–5.1)
Sodium: 134 mmol/L — ABNORMAL LOW (ref 135–145)
Sodium: 130 mmol/L — ABNORMAL LOW (ref 135–145)

## 2020-03-31 LAB — CBC WITH DIFFERENTIAL/PLATELET
Abs Immature Granulocytes: 0.42 10*3/uL — ABNORMAL HIGH (ref 0.00–0.07)
Basophils Absolute: 0 10*3/uL (ref 0.0–0.1)
Basophils Relative: 0 %
Eosinophils Absolute: 0 10*3/uL (ref 0.0–0.5)
Eosinophils Relative: 0 %
HCT: 28.7 % — ABNORMAL LOW (ref 39.0–52.0)
Hemoglobin: 9.3 g/dL — ABNORMAL LOW (ref 13.0–17.0)
Immature Granulocytes: 3 %
Lymphocytes Relative: 3 %
Lymphs Abs: 0.4 10*3/uL — ABNORMAL LOW (ref 0.7–4.0)
MCH: 30.1 pg (ref 26.0–34.0)
MCHC: 32.4 g/dL (ref 30.0–36.0)
MCV: 92.9 fL (ref 80.0–100.0)
Monocytes Absolute: 1.1 10*3/uL — ABNORMAL HIGH (ref 0.1–1.0)
Monocytes Relative: 8 %
Neutro Abs: 11.4 10*3/uL — ABNORMAL HIGH (ref 1.7–7.7)
Neutrophils Relative %: 86 %
Platelets: 134 10*3/uL — ABNORMAL LOW (ref 150–400)
RBC: 3.09 MIL/uL — ABNORMAL LOW (ref 4.22–5.81)
RDW: 15.1 % (ref 11.5–15.5)
WBC: 13.3 10*3/uL — ABNORMAL HIGH (ref 4.0–10.5)
nRBC: 0.3 % — ABNORMAL HIGH (ref 0.0–0.2)

## 2020-03-31 LAB — CBC
HCT: 29.2 % — ABNORMAL LOW (ref 39.0–52.0)
Hemoglobin: 9.5 g/dL — ABNORMAL LOW (ref 13.0–17.0)
MCH: 29.3 pg (ref 26.0–34.0)
MCHC: 32.5 g/dL (ref 30.0–36.0)
MCV: 90.1 fL (ref 80.0–100.0)
Platelets: 131 10*3/uL — ABNORMAL LOW (ref 150–400)
RBC: 3.24 MIL/uL — ABNORMAL LOW (ref 4.22–5.81)
RDW: 14.7 % (ref 11.5–15.5)
WBC: 13.5 10*3/uL — ABNORMAL HIGH (ref 4.0–10.5)
nRBC: 0.4 % — ABNORMAL HIGH (ref 0.0–0.2)

## 2020-03-31 LAB — POCT I-STAT 7, (LYTES, BLD GAS, ICA,H+H)
Acid-Base Excess: 5 mmol/L — ABNORMAL HIGH (ref 0.0–2.0)
Bicarbonate: 27.1 mmol/L (ref 20.0–28.0)
Calcium, Ion: 1.17 mmol/L (ref 1.15–1.40)
HCT: 28 % — ABNORMAL LOW (ref 39.0–52.0)
Hemoglobin: 9.5 g/dL — ABNORMAL LOW (ref 13.0–17.0)
O2 Saturation: 100 %
Patient temperature: 96.1
Potassium: 5 mmol/L (ref 3.5–5.1)
Sodium: 135 mmol/L (ref 135–145)
TCO2: 28 mmol/L (ref 22–32)
pCO2 arterial: 29 mmHg — ABNORMAL LOW (ref 32.0–48.0)
pH, Arterial: 7.573 — ABNORMAL HIGH (ref 7.350–7.450)
pO2, Arterial: 137 mmHg — ABNORMAL HIGH (ref 83.0–108.0)

## 2020-03-31 LAB — COOXEMETRY PANEL
Carboxyhemoglobin: 2.1 % — ABNORMAL HIGH (ref 0.5–1.5)
Carboxyhemoglobin: 1.5 % (ref 0.5–1.5)
Methemoglobin: 1.1 % (ref 0.0–1.5)
Methemoglobin: 0.4 % (ref 0.0–1.5)
O2 Saturation: 58.2 %
O2 Saturation: 53.5 %
Total hemoglobin: 8.6 g/dL — ABNORMAL LOW (ref 12.0–16.0)
Total hemoglobin: 9.5 g/dL — ABNORMAL LOW (ref 12.0–16.0)

## 2020-03-31 LAB — HEPATIC FUNCTION PANEL
ALT: 21 U/L (ref 0–44)
AST: 30 U/L (ref 15–41)
Albumin: 2.2 g/dL — ABNORMAL LOW (ref 3.5–5.0)
Alkaline Phosphatase: 116 U/L (ref 38–126)
Bilirubin, Direct: 0.3 mg/dL — ABNORMAL HIGH (ref 0.0–0.2)
Indirect Bilirubin: 0.6 mg/dL (ref 0.3–0.9)
Total Bilirubin: 0.9 mg/dL (ref 0.3–1.2)
Total Protein: 5.5 g/dL — ABNORMAL LOW (ref 6.5–8.1)

## 2020-03-31 LAB — PROTIME-INR
INR: 2 — ABNORMAL HIGH (ref 0.8–1.2)
Prothrombin Time: 22.6 seconds — ABNORMAL HIGH (ref 11.4–15.2)

## 2020-03-31 LAB — LACTATE DEHYDROGENASE: LDH: 415 U/L — ABNORMAL HIGH (ref 98–192)

## 2020-03-31 LAB — MAGNESIUM: Magnesium: 2.4 mg/dL (ref 1.7–2.4)

## 2020-03-31 MED ORDER — SODIUM PHOSPHATES 45 MMOLE/15ML IV SOLN
30.0000 mmol | Freq: Once | INTRAVENOUS | Status: AC
  Administered 2020-03-31: 30 mmol via INTRAVENOUS
  Filled 2020-03-31: qty 10

## 2020-03-31 MED ORDER — WARFARIN SODIUM 1 MG PO TABS
1.0000 mg | ORAL_TABLET | Freq: Once | ORAL | Status: AC
  Administered 2020-03-31: 17:00:00 1 mg via ORAL
  Filled 2020-03-31: qty 1

## 2020-03-31 MED ORDER — ALBUMIN HUMAN 5 % IV SOLN
INTRAVENOUS | Status: AC
  Filled 2020-03-31: qty 250

## 2020-03-31 MED ORDER — ORAL CARE MOUTH RINSE
15.0000 mL | OROMUCOSAL | Status: DC
  Administered 2020-03-31 – 2020-04-07 (×66): 15 mL via OROMUCOSAL

## 2020-03-31 NOTE — Progress Notes (Signed)
Spoke with Marolyn Hammock, RN regarding PICC order.  Securechat message to Dr. Joelyn Oms indicating that nephrology does not want PICC line placed.  Marolyn Hammock, RN is aware and in the process of contacting Dr. Prescott Gum.

## 2020-03-31 NOTE — Progress Notes (Signed)
New Left IJ CVC removed per Dr. Lucianne Lei Trigt's order due to malposition.   New PIV established in left forearm.   Per Dr. Prescott Gum, leave Right IJ CVC in until tomorrow, at which time it will be replaced.  CRRT stopped per Dr. Adin Hector order. Heparin 1.85mL per port instilled in catheter and taped with ID of hep lock.   RN will continue to monitor patient.

## 2020-03-31 NOTE — Progress Notes (Signed)
Patient ID: [name] [name], male   DOB: [birth year], 69 y.o.   MRN: [id number] EVENING ROUNDS NOTE :     [place] [address]       ,[place] [address]             [phone]                 11 Days Post-Op Procedure(s) (LRB): REDO STERNOTOMY WITH STERNAL PLATING (N/A) INSERTION OF IMPLANTABLE LEFT VENTRICULAR ASSIST DEVICE - HM3 (N/A) MAZE (N/A) TRANSESOPHAGEAL ECHOCARDIOGRAM (TEE) (N/A) Clipping Of Atrial Appendage (Left)  Total Length of Stay:  LOS: 19 days  BP 90/76   Pulse 77   Temp (!) 93 F (33.9 C) (Axillary)   Resp 20   Ht 6' (1.829 m)   Wt 101.1 kg Comment: head pillow and bed pad only on bed, controller lifted  SpO2 (!) 88%   BMI 30.23 kg/m   .Intake/Output      04/18 0701 - 04/19 0700 04/19 0701 - 04/20 0700   P.O. 480 540   I.V. (mL/kg) 435.8 (4.3) 86.5 (0.9)   NG/GT 1350 850   IV Piggyback  260   Total Intake(mL/kg) 2265.8 (22.4) 1736.5 (17.2)   Urine (mL/kg/hr) 0 (0)    Emesis/NG output 0    Other 1632 1514   Stool 0 0   Blood 0    Total Output 1632 1514   Net +633.8 +222.5        Urine Occurrence 0 x    Stool Occurrence 0 x 3 x   Emesis Occurrence 0 x      .  prismasol BGK 4/2.5 500 mL/hr at 03/31/20 0930  .  prismasol BGK 4/2.5 500 mL (03/29/20 0620)  . sodium chloride Stopped (03/22/20 2011)  . sodium chloride Stopped (03/27/20 1523)  . sodium chloride    . sodium chloride 20 mL/hr at 03/27/20 0500  . sodium chloride    . dexmedetomidine (PRECEDEX) IV infusion Stopped (03/31/20 0656)  . feeding supplement (VITAL 1.5 CAL) 1,000 mL (03/31/20 0954)  . lactated ringers Stopped (03/29/20 0748)  . milrinone 0.25 mcg/kg/min (03/31/20 1700)  . norepinephrine (LEVOPHED) Adult infusion Stopped (03/21/20 1049)  . prismasol BGK 4/2.5 1,500 mL/hr at 03/31/20 1140     Lab Results  Component Value Date   WBC 13.3 (H) 03/31/2020   HGB 9.5 (L) 03/31/2020   HCT 28.0 (L) 03/31/2020   PLT 134 (L) 03/31/2020   GLUCOSE 351 (H) 03/31/2020    CHOL 89 03/15/2020   TRIG 69 04/06/2020   HDL 33 (L) 03/24/2020   LDLCALC 42 03/21/2020   ALT 21 03/31/2020   AST 30 03/31/2020   NA 130 (L) 03/31/2020   K 5.1 03/31/2020   CL 95 (L) 03/31/2020   CREATININE 1.98 (H) 03/31/2020   BUN 30 (H) 03/31/2020   CO2 25 03/31/2020   TSH 9.134 (H) 04/04/2020   INR 2.0 (H) 03/31/2020   HGBA1C 10.5 (H) 03/27/2020    Left ij goes out  ? intercostal vein with blood return - to be removed   Grace Isaac MD  Beeper [phone] Office [phone] 03/31/2020 5:57 PM

## 2020-03-31 NOTE — Progress Notes (Addendum)
Patient ID: [name] [name], male   DOB: 09-18-1951, 68 y.o.   MRN: [id number]    Advanced Heart Failure Rounding Note   Subjective:    - S/p HM3 VAD 4/8 w MAZE procedure + LAA clipping. - Extubated 4/9 - 4/12 LVAD speed increased to 5400 --> echo moderate-severe RV dysfunction, septum mildly left shifted, trivial pericardial effusion.  LVAD speed cut back to 5300.  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   Off NE. Weaning down milrinone, reduced to 0.25 yesterday. Co-ox 58% (early AM draw, 68% yesterday). Repeat pending.   Remains anuric on CVVHD pulling even. CVP 9  Awake but remains a bit agitated. Asking for water.    INR 2.0  LDH 467 => 453 => 454 => 474 => 428=>415   VAD Interrogation  Flow 4.2, Speed 5300, Power 3.8, Pulse Index 3.9   VAD interrogated personally. Parameters stable.   Objective:   Weight Range:  Vital Signs:   Temp:  [96.1 F (35.6 C)-97.8 F (36.6 C)] 96.1 F (35.6 C) (04/19 0400) Pulse Rate:  [30-143] 71 (04/19 0715) Resp:  [11-25] 14 (04/19 0715) BP: (63-125)/(26-85) 91/80 (04/19 0715) SpO2:  [91 %-95 %] 93 % (04/19 0715) Arterial Line BP: (73-92)/(62-74) 86/66 (04/18 1600) Weight:  [101.1 kg] 101.1 kg (04/19 0354) Last BM Date: 03/31/20  Weight change: Filed Weights   03/29/20 0600 03/30/20 0500 03/31/20 0354  Weight: 99.8 kg 101.4 kg 101.1 kg    Intake/Output:   Intake/Output Summary (Last 24 hours) at 03/31/2020 0735 Last data filed at 03/31/2020 0700 Gross per 24 hour  Intake 2265.81 ml  Output 1632 ml  Net 633.81 ml       Physical Exam: CVP 9 General: Lying in bed a bit agitated. [place] on. No respiratory distress  HEENT: normal  + cor- trak Neck: supple. RIJ introducer  Carotids 2+ bilat; no bruits. No lymphadenopathy or thryomegaly appreciated. Cor: + LVAD hum. Sternotomy incision ok.   Lungs: Clear, no wheezing  Abdomen:  soft, nontender, non-distended. No hepatosplenomegaly. No bruits or masses.  Good bowel sounds.  Driveline site clean/ dry bandage. Anchor in place.  Extremities: no cyanosis, clubbing, rash. Warm no edema  Neuro: alert, speech mildly impaired, moving all 4 extremities     Telemetry: A fib 80s Personally reviewed   Labs: Basic Metabolic Panel: Recent Labs  Lab 03/27/20 0343 03/27/20 0345 03/28/20 0433 03/28/20 0441 03/28/20 1500 03/29/20 0318 03/29/20 0319 03/29/20 0330 03/29/20 1720 03/29/20 1720 03/30/20 0324 03/30/20 0340 03/30/20 0730 03/30/20 1706 03/31/20 0339 03/31/20 0425  NA 138   < > 136   < >   < >  --  136   < > 134*   < >  --  135 133* 135 134* 135  K 3.5   < > 4.1   < >   < >  --  4.4   < > 4.6   < >  --  4.8 4.9 5.1 4.9 5.0  CL 103   < > 102   < >   < >  --  102  --  101  --   --   --  100 102 100  --   CO2 24   < > 24   < >   < >  --  24  --  23  --   --   --  24 24 26   --   GLUCOSE 129*   < >  231*   < >   < >  --  206*  --  320*  --   --   --  216* 273* 130*  --   BUN 42*   < > 38*   < >   < >  --  31*  --  33*  --   --   --  30* 31* 29*  --   CREATININE 2.44*   < > 2.41*   < >   < >  --  2.10*  --  2.02*  --   --   --  2.02* 2.10* 1.96*  --   CALCIUM 8.3*   < > 8.2*   < >   < >  --  8.4*   < > 8.2*   < >  --   --  8.3* 8.1* 8.3*  --   MG 2.6*  --  2.6*  --   --  2.6*  --   --   --   --  2.4  --   --   --  2.4  --   PHOS 2.6   < > 2.2*   < >   < >  --  2.4*  --  3.2  --   --   --  2.3* 2.3* 2.2*  --    < > = values in this interval not displayed.    Liver Function Tests: Recent Labs  Lab 03/27/20 0343 03/27/20 1541 03/29/20 1050 03/29/20 1050 03/29/20 1720 03/30/20 0324 03/30/20 0730 03/30/20 1706 03/31/20 0339  AST 38  --  31  --   --  27  --   --  30  ALT 25  --  25  --   --  24  --   --  21  ALKPHOS 94  --  124  --   --  119  --   --  116  BILITOT 1.3*  --  1.3*  --   --  1.0  --   --  0.9  PROT 5.7*  --  6.2*  --   --  5.6*  --   --  5.5*  ALBUMIN 2.6*  2.5*   < > 2.5*   < > 2.4* 2.3* 2.4* 2.3* 2.2*  2.2*   <  > = values in this interval not displayed.   No results for input(s): LIPASE, AMYLASE in the last 168 hours. Recent Labs  Lab 03/27/20 1033  AMMONIA 35    CBC: Recent Labs  Lab 03/26/20 0356 03/26/20 0358 03/27/20 0343 03/27/20 0345 03/28/20 1013 03/28/20 1013 03/29/20 0318 03/29/20 0330 03/30/20 0324 03/30/20 0340 03/30/20 1706 03/31/20 0339 03/31/20 0425  WBC 6.8   < > 10.4   < > 14.7*  --  13.8*  --  13.7*  --  13.5* 13.3*  --   NEUTROABS 5.5  --  8.9*  --   --   --  11.8*  --  11.6*  --   --  11.4*  --   HGB 9.0*   < > 8.9*   < > 9.8*   < > 10.2*   < > 9.5* 13.6 9.5* 9.3* 9.5*  HCT 26.9*   < > 27.2*   < > 30.4*   < > 31.5*   < > 29.4* 40.0 29.2* 28.7* 28.0*  MCV 90.9   < > 90.7   < > 92.7  --  92.4  --  90.5  --  90.1 92.9  --   PLT 125*   < > 139*   < > 158  --  152  --  134*  --  131* 134*  --    < > = values in this interval not displayed.    Cardiac Enzymes: No results for input(s): CKTOTAL, CKMB, CKMBINDEX, TROPONINI in the last 168 hours.  BNP: BNP (last 3 results) Recent Labs    01/04/20 1642 03/21/20 0209 03/26/20 2329  BNP 165.2* 315.9* 425.7*    ProBNP (last 3 results) No results for input(s): PROBNP in the last 8760 hours.    Other results:  Imaging: DG Chest Port 1 View  Result Date: 03/30/2020 CLINICAL DATA:  Shortness of breath and chest pain. LVAD in place. EXAM: PORTABLE CHEST 1 VIEW COMPARISON:  Yesterday. FINDINGS: Stable right jugular catheter sheath. Stable right subclavian catheter its tip at the superior cavoatrial junction. Feeding tube extending into the stomach. Stable LVAD. Stable left subclavian pacer and AICD leads. Mildly decreased left basilar atelectasis and possible pneumonia. Resolved mild right basilar atelectasis. Stable prominence of the pulmonary vasculature and interstitial markings with resolved mild superimposed airspace opacities. Stable enlarged cardiac silhouette. Moderate left glenohumeral joint degenerative  changes. IMPRESSION: 1. Mildly decreased left basilar atelectasis and possible pneumonia. 2. Resolved mild right basilar atelectasis. 3. Improving changes of congestive heart failure with stable cardiomegaly. Electronically Signed   By: Claudie Revering M.D.   On: 03/30/2020 10:30     Medications:     Scheduled Medications: . sodium chloride   Intravenous Once  . amiodarone  200 mg Per Tube BID  . aspirin  81 mg Per Tube Daily  . atorvastatin  80 mg Per Tube q1800  . B-complex with vitamin C  1 tablet Per Tube Daily  . bisacodyl  10 mg Oral Daily   Or  . bisacodyl  10 mg Rectal Daily  . chlorhexidine  15 mL Mouth Rinse BID  . Chlorhexidine Gluconate Cloth  6 each Topical Daily  . docusate  200 mg Per Tube Daily  . feeding supplement (PRO-STAT SUGAR FREE 64)  30 mL Per Tube Daily  . influenza vaccine adjuvanted  0.5 mL Intramuscular Tomorrow-1000  . insulin aspart  0-24 Units Subcutaneous Q4H  . insulin aspart  3 Units Subcutaneous Q4H  . insulin glargine  24 Units Subcutaneous BID  . mouth rinse  15 mL Mouth Rinse Q2H  . melatonin  3 mg Per Tube QHS  . pantoprazole (PROTONIX) IV  40 mg Intravenous Daily  . QUEtiapine  25 mg Oral QHS  . sildenafil  20 mg Per Tube TID  . sodium chloride flush  10-40 mL Intracatheter Q12H  . Warfarin - Pharmacist Dosing Inpatient   Does not apply q1600    Infusions: .  prismasol BGK 4/2.5 500 mL (03/30/20 2335)  .  prismasol BGK 4/2.5 500 mL (03/29/20 0620)  . sodium chloride Stopped (03/22/20 2011)  . sodium chloride Stopped (03/27/20 1523)  . sodium chloride    . sodium chloride 20 mL/hr at 03/27/20 0500  . sodium chloride    . dexmedetomidine (PRECEDEX) IV infusion 0.2 mcg/kg/hr (03/31/20 0600)  . feeding supplement (VITAL 1.5 CAL) 60 mL/hr at 03/31/20 0600  . lactated ringers Stopped (03/29/20 0748)  . milrinone 0.25 mcg/kg/min (03/31/20 4268)  . norepinephrine (LEVOPHED) Adult infusion Stopped (03/21/20 1049)  . prismasol BGK 4/2.5 1,500  mL/hr (03/31/20 0504)    PRN Medications: sodium chloride, Place/Maintain arterial line **AND** sodium chloride, acetaminophen,  dextrose, fentaNYL (SUBLIMAZE) injection, heparin, heparin, hydrALAZINE, HYDROcodone-acetaminophen, levalbuterol, LORazepam, ondansetron (ZOFRAN) IV, ondansetron (ZOFRAN) IV, oxyCODONE, sodium chloride, sodium chloride flush, sorbitol, traMADol   Assessment/Plan:   1. Acute on chronic systolic HF -> cardiogenic shock-> S/p HM3 LVAD - Echo 2016 EF 30-35% - Echo 1/21 EF 25% - Admitted with NYHA IV symptoms and AKI with attempts at diuresis.  - Echo this admit: EF 10-15% moderate RV dysfunction - R/LHC cath 3/21 with severe 3v CAD and low output with CI 1.7.  - Swan placed. Initial co-ox 39%. Initial PAPi 1.5. Started on milrinone 0.25. - Initially planned for CABG but given need for re-do sternotomy, relatively poor targets and longstanding low EF, VAD felt to be better option  - S/p HM3 VAD 4/8 - Speed turned up on 4/11to  5400, but looking at 4/10 echo there is significant RV dysfunction with leftward septal shift so speed decreased back to 5300 rpm.  - Continue sildenafil 20 mg tid  - Remains on milrinone 0.25. Co-ox 58% (will repeat).  - CVP 9. Continue CVVHD. Weight at baseline. Run even  2. VAD - VAD interrogated personally. Parameters stable. - LDH stable at 415 - INR  2.0 Personally reviewed - Driveline ok    3. CAD with unstable angina - s/p previous PCI - cath 03/11/2020 with severe 3v CAD - now s/p VAD on 04/09/2020    4. AKI on CKD 3a - Nephrology consulted. Started CVVHD 4/13  - May reflect RV failure.  Renal US 4/5 unremarkable.  - CVP 9. Weight below baseline. Run CVVHD even for now Keep CVP 8-10 - Hopefully renal function will recover  5. Permanent AF - now s/p MAZE + LAA Clipping 4/8   - continue  amiodarone per tube for rate control.  - Warfarin as above.  6. MVP s/p MV repair - On admit with recurrent severe MR on echo and with  huge v-waves on PCWP tracing - s/p VAD  7. DM2, poorly controlled - hgbA1c 11.1% - continue insulin and SSI   8. ID  - 4/11 procalcitonin 1.96  - Started empiric coverage for PNA with vancomycin/cefepime for 7 days. Stopped 4/17  9. Acute Hypoxic Respiratory Failure - Off Bipap now, on nasal cannula.   - CXR stable  10. Malnutrition  - Albumin 2.9   - On tube feeds.  - continues w/ mild-mod oropharyngeal dysphagia. Continue NPO + oral care. SLP following   11. CVA - 03/27/20 CT large posterior MCA ischemic infarct, possibly from atrial fibrillation.  INR was supratherapeutic when CVA found.  Stable LDH, do not think partial pump thrombosis is the culprit. - MAP stable in 70s-80s off pressors.   - INR 2.0 today, aim to keep in therapeutic range. - Neurology following.  - Seems to be making progress.  - Hopefully will qualify for CIR on d/c  12. Coagulopathy - Seen by hematology, think due to cirrhosis and malabsorption of vitamin K.  - Improved  13. Agitation - continue to wean precedex as tolerated - continue seroquel qhs    Length of Stay: [place] PA-C  03/31/2020, 7:35 AM  Advanced Heart Failure Team Pager [phone] (M-F; [place])  Please contact [facility] for night-coverage after hours (4p -7a ) and weekends on [email or web address]  Agree with above.   Awake but agitated at times. Requiring Precedex. Seroquel started yesterday. Milrinone weaned from 0.375 to 0.25 co-ox down to 53% today. Remains on CVVHD. Keeping even. (weight well  below pre-op). Anuric.  MAPs 70-80s. VAD interrogated personally. Parameters stable. INR 2.0  General:  Weak appearing.  HEENT: normal  Neck: supple. RIJ introducer. R [place] HD cath  Carotids 2+ bilat; no bruits. No lymphadenopathy or thryomegaly appreciated. Cor: LVAD hum. Sternal incision ok  Lungs: Clear. Abdomen: soft, nontender, non-distended. No hepatosplenomegaly. No bruits or masses. Good bowel sounds. Driveline site  clean. Anchor in place.  Extremities: no cyanosis, clubbing, rash. Warm no edema  Neuro: alert & oriented x2 slightly weak on left. Agitated at times  Still struggling with RV failure, ARF and agitation post VAD implant. Co-ox marginal today. Will follow. May need to bump milrinone back. Keep MAP > 70 to help facilitate possible renal recovery. Try to get up to chair if possible. Will discuss possible PICC line with Renal.   CRITICAL CARE Performed by: Glori Bickers  Total critical care time: 35 minutes  Critical care time was exclusive of separately billable procedures and treating other patients.  Critical care was necessary to treat or prevent imminent or life-threatening deterioration.  Critical care was time spent personally by me (independent of midlevel providers or residents) on the following activities: development of treatment plan with patient and/or surrogate as well as nursing, discussions with consultants, evaluation of patient's response to treatment, examination of patient, obtaining history from patient or surrogate, ordering and performing treatments and interventions, ordering and review of laboratory studies, ordering and review of radiographic studies, pulse oximetry and re-evaluation of patient's condition.  Glori Bickers, MD  8:43 AM

## 2020-03-31 NOTE — Progress Notes (Signed)
Physical Therapy Treatment Patient Details Name: [name] [name] MRN: [id number] DOB: [birth year] Today's Date: 03/31/2020    History of Present Illness 69 yo male with history of CAD with prior stenting of the LAD, Diagonal and OM in an outside hospital, ischemic cardiomyopathy with ICD in place, chronic systolic CHF, MVP s/[name] mitral valve repair, permanent atrial fib on chronic anticoagulation, HTN, HLD, DM, depression admitted to [facility] with progressive weakness, fatigue, dyspnea and chest pressure. Troponin elevated at 0.06. He was transferred to [facility] for cardiac cath, showing severe 3 vessel disease. Plan for CABG but pt with AKI. Pt underwent multiple tooth extraction on 4/6 and LVAD placement on 4/8. Postop day 7 patient was noted to be agitated the CT head was obtained showing a large posterior right MCA infarct     PT Comments    Pt restless on arrival with bil mittens internally distracted by desire for water despite education. Pt with decreased LLE strength, decreased awareness of precautions and lines but able to progress to 2 standing trials with 2 person assist. Cues and assist for trunk and hip extension with bil knees blocked and education for bil LE HEP. Will continue to follow with CIR appropriate once medically improved.     Follow Up Recommendations  CIR;Supervision/Assistance - 24 hour     Equipment Recommendations  Other (comment)(TBD)    Recommendations for Other Services       Precautions / Restrictions Precautions Precautions: Fall;Sternal Precaution Comments: LVAD, CRRT, cortrak, bil mittens    Mobility  Bed Mobility Overal bed mobility: Needs Assistance Bed Mobility: Supine to Sit;Sit to Supine           General bed mobility comments: supine<>sit utilizing bed egress function of bed. total +2 assist to slide to [name] [name]  Transfers Overall transfer level: Needs assistance Equipment used: 2 person hand held assist Transfers: Sit to/from  Stand Sit to Stand: Max assist;Mod assist;+2 physical assistance         General transfer comment: pt able to stand x 2 trials with initial mod +2 assist then max +2 on second trial. pt stood grossly 1 min then 40 sec on repeat trial  Ambulation/Gait             General Gait Details: unable   Stairs             Wheelchair Mobility    Modified Rankin (Stroke Patients Only)       Balance Overall balance assessment: Needs assistance Sitting-balance support: Feet supported Sitting balance-Leahy Scale: Poor Sitting balance - Comments: minguard EOB with tactile cues after initial assist to obtain midline   Standing balance support: Bilateral upper extremity supported Standing balance-Leahy Scale: Zero                              Cognition Arousal/Alertness: Awake/alert Behavior During Therapy: Restless;Flat affect Overall Cognitive Status: Impaired/Different from baseline Area of Impairment: Orientation;Attention;Memory;Following commands;Safety/judgement;Awareness;Problem solving                 Orientation Level: Disoriented to;Time;Situation Current Attention Level: Focused Memory: Decreased recall of precautions;Decreased short-term memory Following Commands: Follows one step commands inconsistently Safety/Judgement: Decreased awareness of safety;Decreased awareness of deficits Awareness: Intellectual Problem Solving: Slow processing;Decreased initiation;Difficulty sequencing;Requires verbal cues;Requires tactile cues General Comments: pt focused on water despite repeated education that he cannot yet have water. pt intermittently following commands and at times able to exhibit joking singing "help me [name] [name]"  to tech, and clapping hands with therapist tapping leg for HEP initiation      Exercises General Exercises - Lower Extremity Long Arc Quad: AAROM;Both;Seated;10 reps;AROM(AAROM on LLE) Hip Flexion/Marching: AAROM;Both;10  reps;Seated;AROM(AAROM on LLE)    General Comments        Pertinent Vitals/Pain Pain Assessment: No/denies pain    Home Living                      Prior Function            PT Goals (current goals can now be found in the care plan section) Progress towards PT goals: Progressing toward goals    Frequency    Min 3X/week      PT Plan Current plan remains appropriate    Co-evaluation              AM-PAC PT "6 Clicks" Mobility   Outcome Measure  Help needed turning from your back to your side while in a flat bed without using bedrails?: A Lot Help needed moving from lying on your back to sitting on the side of a flat bed without using bedrails?: A Lot Help needed moving to and from a bed to a chair (including a wheelchair)?: Total Help needed standing up from a chair using your arms (e.g., wheelchair or bedside chair)?: Total Help needed to walk in hospital room?: Total Help needed climbing 3-5 steps with a railing? : Total 6 Click Score: 8    End of Session Equipment Utilized During Treatment: Oxygen;Gait belt Activity Tolerance: Patient tolerated treatment well Patient left: in bed;with call bell/phone within reach;with nursing/sitter in room Nurse Communication: Mobility status PT Visit Diagnosis: Unsteadiness on feet (R26.81);Muscle weakness (generalized) (M62.81);Difficulty in walking, not elsewhere classified (R26.2);Other symptoms and signs involving the nervous system (R29.898);Other abnormalities of gait and mobility (R26.89)     Time: 0802-0829 PT Time Calculation (min) (ACUTE ONLY): 27 min  Charges:  $Therapeutic Exercise: 8-22 mins $Therapeutic Activity: 8-22 mins                     [name] [name], PT Acute Rehabilitation Services Pager: [phone] Office: [place] 03/31/2020, 1:07 PM

## 2020-03-31 NOTE — Progress Notes (Signed)
CT surgery procedure note Central line placement Indications-right IJ cordis placed at the time of surgery 10 days ago needs to be removed for risk of infection. Peripheral central venous line-PICC not approved by renal because of postoperative dialysis. Limited options for new central line because of existing Cordis sheath in right IJ and dialysis catheter in right subclavian vein and AICD wires in left subclavian vein  After informed consent a left IJ triple-lumen catheter was placed under sterile prep and drape and proper timeout. A Seldinger technique was used over guidewire.  Guidewire passed easily. The catheter passed and flushed easily and was secured to the skin and a chest x-ray ordered.  The chest x-ray shows that the triple-lumen catheter did not pass through the innominate vein probably related to obstruction from the AICD wires and went through the subclavian vein and out into a chest wall vein.  There is no evidence of pneumothorax or new pleural density.  The catheter will be removed.  The patient will probably need a new right IJ catheter.

## 2020-03-31 NOTE — Progress Notes (Signed)
ANTICOAGULATION CONSULT NOTE  Pharmacy Consult for warfarin Indication: post op LVAD  / Aflutter  Allergies  Allergen Reactions  . Liraglutide Nausea And Vomiting  . Lisinopril Cough    Patient Measurements: Height: 6' (182.9 cm) Weight: 101.1 kg (222 lb 14.2 oz)(head pillow and bed pad only on bed, controller lifted) IBW/kg (Calculated) : 77.6 Heparin Dosing Weight: 101.2 kg  Vital Signs: Temp: 96.1 F (35.6 C) (04/19 0400) Temp Source: Axillary (04/19 0400) BP: 91/80 (04/19 0715) Pulse Rate: 71 (04/19 0715)  Labs: Recent Labs    03/29/20 0318 03/29/20 0319 03/30/20 0324 03/30/20 0340 03/30/20 0730 03/30/20 1706 03/30/20 1706 03/31/20 0339 03/31/20 0425  HGB 10.2*   < > 9.5*   < >  --  9.5*   < > 9.3* 9.5*  HCT 31.5*   < > 29.4*   < >  --  29.2*  --  28.7* 28.0*  PLT 152   < > 134*  --   --  131*  --  134*  --   LABPROT 26.0*  --  24.3*  --   --   --   --  22.6*  --   INR 2.4*  --  2.2*  --   --   --   --  2.0*  --   CREATININE  --    < >  --   --  2.02* 2.10*  --  1.96*  --    < > = values in this interval not displayed.    Estimated Creatinine Clearance: 43.2 mL/min (A) (by C-G formula based on SCr of 1.96 mg/dL (H)).   Medical History: Past Medical History:  Diagnosis Date  . Anxiety   . Arthritis   . CAD (coronary artery disease)    a.  1993 s/p MI - [employer];  b. s/p BMS to LAD '00;  c. PTCA 2nd diagonal 2010;  d. 02/18/12 Cath: moderate nonobs dzs - med rx;  e.  01/2015 Cath: LM nl, LAD 40-70m ISR, 70-67m/d, d1 90p (3.0x16 Synergy DES), D2 50-60, LCX nl, OM1 50p, 24m (2.5x12 Synergy DES), RCA nl, EF 30-35%.  . Chronic combined systolic and diastolic CHF (congestive heart failure) ([place])    a. 12/2014 Echo: EF 30-35%, Gr2 DD, mod MR, sev dil LA.  . CKD (chronic kidney disease), stage III   . Depression   . ED (erectile dysfunction)   . GERD (gastroesophageal reflux disease)   . Hyperlipidemia   . Hypertension   . Ischemic cardiomyopathy    a.  s/p St. [name] (Atlas) ICD implanted in [place] 2007;  b. 12/2014 Echo: Ef 30-35%.  . MVP (mitral valve prolapse)    a. s/p MV annuloplasty at [facility] 2004.  Marland Kitchen Persistent atrial fibrillation ([place])    a. noted on ICD interrogation '10 - not previously on [place] - CHA2DS2VASc = 5.  . Type II diabetes mellitus ([place])    uncontrolled    Medications:  Medications Prior to Admission  Medication Sig Dispense Refill Last Dose  . ALPRAZolam (XANAX) 0.25 MG tablet Take 0.25 mg by mouth 3 (three) times daily as needed for anxiety.    unknown  . amiodarone (PACERONE) 200 MG tablet Take 200 mg by mouth daily.    03/11/2020 at Unknown time  . apixaban (ELIQUIS) 5 MG TABS tablet Take 1 tablet (5 mg total) by mouth 2 (two) times daily. 60 tablet 1 03/11/2020 at 0600  . atorvastatin (LIPITOR) 40 MG tablet Take 1 tablet (40  mg total) by mouth daily at 6 PM. 30 tablet 0 03/10/2020 at Unknown time  . citalopram (CELEXA) 20 MG tablet Take 20 mg by mouth daily.    03/11/2020 at Unknown time  . furosemide (LASIX) 40 MG tablet Take 40 mg by mouth daily.    03/11/2020 at Unknown time  . insulin aspart (NOVOLOG FLEXPEN) 100 UNIT/ML FlexPen Inject 6 Units into the skin 3 (three) times daily with meals. 15 mL 0 03/11/2020 at Unknown time  . Insulin Glargine (BASAGLAR KWIKPEN) 100 UNIT/ML SOPN Inject 0.5 mLs (50 Units total) into the skin daily. 15 mL 1 03/11/2020 at Unknown time  . metoprolol succinate (TOPROL-XL) 100 MG 24 hr tablet Take 1 tablet (100 mg total) by mouth daily. 90 tablet 3 03/11/2020 at 0600  . nitroGLYCERIN (NITROSTAT) 0.4 MG SL tablet Place 1 tablet (0.4 mg total) under the tongue every 5 (five) minutes x 3 doses as needed for chest pain. 10 tablet 0 unknown  . pantoprazole (PROTONIX) 40 MG tablet Take 1 tablet (40 mg total) by mouth daily. 30 tablet 1 03/11/2020 at Unknown time  . potassium chloride SA (KLOR-CON) 20 MEQ tablet Take 20 mEq by mouth daily.    03/11/2020 at Unknown time  . tamsulosin (FLOMAX) 0.4  MG CAPS capsule Take 1 capsule (0.4 mg total) by mouth daily. 30 capsule 0 03/11/2020 at Unknown time  . ALPRAZolam (XANAX) 1 MG tablet Take 1 tablet (1 mg total) by mouth 2 (two) times daily as needed for anxiety. (Patient not taking: Reported on 02/25/2020) 10 tablet 0 Not Taking at Unknown time    Assessment: 35 YOM with atrial fibrillation on Eliquis PTA s/p R & L heart cath found to have 3v disease s/p  LVAD  Implant 4/8. Warfarin initiated on 4/9 after discussing with MD.  CT on 4/15 now found to have large right MCA stroke. INR had been elevated up to 9.0 postop, he received 1 dose of vitamin K 1 mg IV on 4/13 and again on 4/15 with INR still elevated at 6.2.   Warfarin resumed cautiously on 4/17, INR therapeutic at 2 today. H/H and LDH stable.  Goal of Therapy:  INR 2-2.5 Heparin level 0.3-0.7 units/ml Monitor platelets by anticoagulation protocol: Yes   Plan:  -Warfarin 1 mg PO x1 tonight -Daily INR   Vertis Kelch, PharmD, [facility] PGY2 Cardiology Pharmacy Resident Phone [phone] 03/31/2020       7:39 AM  Please check [email or web address] for unit-specific pharmacist phone numbers

## 2020-03-31 NOTE — Progress Notes (Signed)
CSW met at bedside with patient. He was just starting speech therapy and appeared pleased to have something to drink. Patient appeared to respond appropriately and was engaged with CSW. CSW continues to follow for supportive intervention throughout implant hospitalization. [name], [place], [place]

## 2020-03-31 NOTE — Progress Notes (Signed)
  Speech Language Pathology Treatment: Dysphagia  Patient Details Name: [name] [name] MRN: [id number] DOB: Jan 19, 1951 Today's Date: 03/31/2020 Time: 0355-9741 SLP Time Calculation (min) (ACUTE ONLY): 20 min  Assessment / Plan / Recommendation Clinical Impression  F/u diagnostic treatment complete consisting of po trials to determine readiness to advance diet. SLP aroused and repositioned patient prior to pos to maximize safety with intake. Once aroused, patient able to consume 3 ounces of pureed solids and greater than 8 ounces of thin liquid without overt indication of aspiration with SLP providing max verbal and tactile cueing for small single sips of liquid. Delayed strong cough response noted with multiple consecutive sips of liquid via straw suggestive of aspiration although strength of cough likely effective to clear. Patient declined trials of advance solids. Recommend initiation of pos with close supervision from RN and f/u from SLP. Ability to advance solids good with improving mentation, willingness to trials advanced solids.    HPI HPI: Patient is a 69 y.o. male with PMH: ICM, systolic CHF EF 63%, LAD stenting, s/p AICD, MVP s/p mitral valve repair 2004, atrial fibrillation on anticoagulation, HTN, HLD, DM, depression, who was admitted to [facility] with worsening weakeness, fatigue, dyspnea, chest pressure and was then transferred to [facility] for cardiac cath which showed severe three vessel CAD and ECHO shows EF 10-15% with moderate RV dysfunction. Since 4/12, he has had continued decline in mental status, began CRRT on 4/13, required BiPAP support. Head CT revealed large posterior MCA infarct on right side.      SLP Plan  Goals updated       Recommendations  Diet recommendations: Dysphagia 1 (puree);Thin liquid Liquids provided via: Cup;Straw Medication Administration: Whole meds with puree Supervision: Staff to assist with self feeding;Full supervision/cueing for  compensatory strategies Compensations: Slow rate;Small sips/bites;Minimize environmental distractions(single sips of thin liquid) Postural Changes and/or Swallow Maneuvers: Seated upright 90 degrees                Oral Care Recommendations: Oral care BID Follow up Recommendations: Skilled Nursing facility;24 hour supervision/assistance SLP Visit Diagnosis: Dysphagia, oropharyngeal phase (R13.12) Plan: Goals updated       GO          Paytin Ramakrishnan MA, CCC-SLP        Geoge Lawrance Meryl 03/31/2020, 11:28 AM

## 2020-03-31 NOTE — Progress Notes (Addendum)
LVAD Coordinator Rounding Note:  HM III LVAD implanted on 04/11/2020 by Dr Orvan Seen under destination therapy criteria.  Pt lying in bed asleep. Nurse reports patient is very restless when awake; will not disturb him at this time. Asked nurse to page me when patient awake and I will come back to assess and perform DL dressing change.   VAD Coordinator returned to patient's room when nurse paged. Pt awake,oriented to place "hospital", speech very difficult to understand.MOE x 4, follows commands. Denies pain, just repeating "thirsty", taking sips of water, coughing at intervals.    Vital signs: Temp: 96.1 HR: 75 Afib Doppler Pressure: 80 Automatic BP: 91/80 (86) O2 Sat: 97% 4 L/[place] Wt: 253.3>262.3>260.8>259.9>237.8>220>223>222.8 lbs   LVAD interrogation reveals:  Speed: 5300 Flow: 4.2 Power: 3.7w  PI: 4.1  Alarms: none Events:  80 PI events yesterday Hematocrit: 29  Fixed speed: 5300 Low speed limit: 5000   Drive Line: CDI. Every other day dressing changes per nurse champion or VAD coordinator.   VAD dressing removed and site care performed using sterile technique. Drive line exit site cleaned with Chlora prep applicators x 2, allowed to dry, and gauze dressing with silver strip re-applied. Exit site healing and unincorporated, the velour is fully implanted at exit site. Scant bloody drainage. No redness, tenderness, foul odor or rash noted. DL remains unincorporated;Marland Kitchen Drive line anchor secure.     Labs:  LDH trend: 161>096>045>409>811>914>782>956>213  INR trend: 1.3>6.2>9>4.4>6.2>2.2>2.4>2.2>2.0  CR: 3.57>3.98>3.18>2.44>2.41>2.0>2.1>1.96  Anticoagulation Plan: -INR Goal: 2.0-2.5 -ASA Dose: 81 mg   Blood Products:  Intraop: 6 FFP  DDAVP  420 cell saver  03/30/2020>>4 FFP 03/21/20>> 1 RBC 03/22/20>>1 RBC  Device: -St [name] -Therapies: VF 194  Drips: Milrinone 0.25 mcg/kg/min Precedex 0.2 mcg/kg/hr  Adverse Events on VAD: >>03/25/20 CVVH Started  Pt Education:  1.  Education inappropriate at this time, pt is able to follow simple commands only. 2. No family at bedside.  Plan/Recommendations:  1. Call VAD coordinator for any equipment or drive line issues.  2. Every other day drive line dressing change per nurse champion or VAD coordinator.   Zada Girt RN [place] Coordinator  Office: [phone]  24/7 Pager: [phone]

## 2020-03-31 NOTE — Procedures (Signed)
Admit: 02/29/2020 LOS: 23  8M dialysis dependent AKI on CRRT, acute CHF exacerbation s/p VAD, status post MV repair, AFib s/p MAZE and LAA clipping 4/8; R MCA CVA 4/15  Current CRRT Prescription: Start Date: 4/13 Catheter: L [place] Temp HD cath BFR: 200 Pre Blood Pump: 500 4K DFR: 1500 4K Replacement Rate: 200 4K Goal UF: net even Anticoagulation: systemic anticoagulation / warfarin Clotting: acceptable   S:  Stable, net even UF  K 5.0, P 2.2  NO uop  O: 04/18 0701 - 04/19 0700 In: 2265.8 [P.O.:480; I.V.:435.8; NG/GT:1350] Out: 1632   Filed Weights   03/29/20 0600 03/30/20 0500 03/31/20 0354  Weight: 99.8 kg 101.4 kg 101.1 kg    Recent Labs  Lab 03/30/20 0730 03/30/20 0730 03/30/20 1706 03/31/20 0339 03/31/20 0425  NA 133*   < > 135 134* 135  K 4.9   < > 5.1 4.9 5.0  CL 100  --  102 100  --   CO2 24  --  24 26  --   GLUCOSE 216*  --  273* 130*  --   BUN 30*  --  31* 29*  --   CREATININE 2.02*  --  2.10* 1.96*  --   CALCIUM 8.3*  --  8.1* 8.3*  --   PHOS 2.3*  --  2.3* 2.2*  --    < > = values in this interval not displayed.   Recent Labs  Lab 03/29/20 0318 03/29/20 0330 03/30/20 0324 03/30/20 0340 03/30/20 1706 03/31/20 0339 03/31/20 0425  WBC 13.8*   < > 13.7*  --  13.5* 13.3*  --   NEUTROABS 11.8*  --  11.6*  --   --  11.4*  --   HGB 10.2*   < > 9.5*   < > 9.5* 9.3* 9.5*  HCT 31.5*   < > 29.4*   < > 29.2* 28.7* 28.0*  MCV 92.4   < > 90.5  --  90.1 92.9  --   PLT 152   < > 134*  --  131* 134*  --    < > = values in this interval not displayed.    Scheduled Meds: . sodium chloride   Intravenous Once  . amiodarone  200 mg Per Tube BID  . aspirin  81 mg Per Tube Daily  . atorvastatin  80 mg Per Tube q1800  . B-complex with vitamin C  1 tablet Per Tube Daily  . bisacodyl  10 mg Oral Daily   Or  . bisacodyl  10 mg Rectal Daily  . chlorhexidine  15 mL Mouth Rinse BID  . Chlorhexidine Gluconate Cloth  6 each Topical Daily  . docusate  200 mg Per  Tube Daily  . feeding supplement (PRO-STAT SUGAR FREE 64)  30 mL Per Tube Daily  . influenza vaccine adjuvanted  0.5 mL Intramuscular Tomorrow-1000  . insulin aspart  0-24 Units Subcutaneous Q4H  . insulin aspart  3 Units Subcutaneous Q4H  . insulin glargine  24 Units Subcutaneous BID  . mouth rinse  15 mL Mouth Rinse Q2H  . melatonin  3 mg Per Tube QHS  . pantoprazole (PROTONIX) IV  40 mg Intravenous Daily  . QUEtiapine  25 mg Oral QHS  . sildenafil  20 mg Per Tube TID  . sodium chloride flush  10-40 mL Intracatheter Q12H  . warfarin  1 mg Oral ONCE-1600  . Warfarin - Pharmacist Dosing Inpatient   Does not apply q1600   Continuous  Infusions: .  prismasol BGK 4/2.5 500 mL (03/30/20 2335)  .  prismasol BGK 4/2.5 500 mL (03/29/20 0620)  . sodium chloride Stopped (03/22/20 2011)  . sodium chloride Stopped (03/27/20 1523)  . sodium chloride    . sodium chloride 20 mL/hr at 03/27/20 0500  . sodium chloride    . dexmedetomidine (PRECEDEX) IV infusion Stopped (03/31/20 0656)  . feeding supplement (VITAL 1.5 CAL) 60 mL/hr at 03/31/20 0600  . lactated ringers Stopped (03/29/20 0748)  . milrinone 0.25 mcg/kg/min (03/31/20 0800)  . norepinephrine (LEVOPHED) Adult infusion Stopped (03/21/20 1049)  . prismasol BGK 4/2.5 1,500 mL/hr (03/31/20 0504)   PRN Meds:.sodium chloride, Place/Maintain arterial line **AND** sodium chloride, acetaminophen, dextrose, fentaNYL (SUBLIMAZE) injection, heparin, heparin, hydrALAZINE, HYDROcodone-acetaminophen, levalbuterol, LORazepam, ondansetron (ZOFRAN) IV, ondansetron (ZOFRAN) IV, oxyCODONE, sodium chloride, sodium chloride flush, sorbitol, traMADol  ABG    Component Value Date/Time   PHART 7.573 (H) 03/31/2020 0425   PCO2ART 29.0 (L) 03/31/2020 0425   PO2ART 137.0 (H) 03/31/2020 0425   HCO3 27.1 03/31/2020 0425   TCO2 28 03/31/2020 0425   ACIDBASEDEF 2.0 03/26/2020 0358   O2SAT 53.5 03/31/2020 0750    A/P  1. 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 Hypophosphatemia 3. sCHF s/p VAD 4/8 4. AFib s/p MAZE and LAA clipping 4/8 5. DM2 6. R MCA ischemic CVA  Stable on CRRT.  Probably need to think about transition to iHD this week.  Will d/w AHF service.  Repelte Phos this AM.   Pearson Grippe, MD [facility]

## 2020-04-01 ENCOUNTER — Inpatient Hospital Stay (HOSPITAL_COMMUNITY): Payer: Medicare HMO

## 2020-04-01 ENCOUNTER — Encounter (HOSPITAL_COMMUNITY): Admission: AD | Disposition: E | Payer: Self-pay | Attending: Cardiothoracic Surgery

## 2020-04-01 ENCOUNTER — Inpatient Hospital Stay (HOSPITAL_COMMUNITY): Payer: Medicare HMO | Admitting: Certified Registered"

## 2020-04-01 DIAGNOSIS — R570 Cardiogenic shock: Secondary | ICD-10-CM | POA: Diagnosis not present

## 2020-04-01 DIAGNOSIS — Z9911 Dependence on respirator [ventilator] status: Secondary | ICD-10-CM

## 2020-04-01 DIAGNOSIS — I255 Ischemic cardiomyopathy: Secondary | ICD-10-CM | POA: Diagnosis not present

## 2020-04-01 DIAGNOSIS — Z95811 Presence of heart assist device: Secondary | ICD-10-CM | POA: Diagnosis not present

## 2020-04-01 HISTORY — PX: HEMATOMA EVACUATION: SHX5118

## 2020-04-01 HISTORY — PX: TEE WITHOUT CARDIOVERSION: SHX5443

## 2020-04-01 HISTORY — PX: VIDEO ASSISTED THORACOSCOPY: SHX5073

## 2020-04-01 LAB — PREPARE RBC (CROSSMATCH)

## 2020-04-01 LAB — BASIC METABOLIC PANEL
Anion gap: 15 (ref 5–15)
BUN: 42 mg/dL — ABNORMAL HIGH (ref 8–23)
CO2: 21 mmol/L — ABNORMAL LOW (ref 22–32)
Calcium: 8.6 mg/dL — ABNORMAL LOW (ref 8.9–10.3)
Chloride: 96 mmol/L — ABNORMAL LOW (ref 98–111)
Creatinine, Ser: 3.29 mg/dL — ABNORMAL HIGH (ref 0.61–1.24)
GFR calc Af Amer: 21 mL/min — ABNORMAL LOW (ref >60)
GFR calc non Af Amer: 18 mL/min — ABNORMAL LOW (ref >60)
Glucose, Bld: 301 mg/dL — ABNORMAL HIGH (ref 70–99)
Potassium: 5.9 mmol/L — ABNORMAL HIGH (ref 3.5–5.1)
Sodium: 132 mmol/L — ABNORMAL LOW (ref 135–145)

## 2020-04-01 LAB — RENAL FUNCTION PANEL
Albumin: 2.3 g/dL — ABNORMAL LOW (ref 3.5–5.0)
Albumin: 2.8 g/dL — ABNORMAL LOW (ref 3.5–5.0)
Anion gap: 10 (ref 5–15)
Anion gap: 11 (ref 5–15)
BUN: 42 mg/dL — ABNORMAL HIGH (ref 8–23)
BUN: 52 mg/dL — ABNORMAL HIGH (ref 8–23)
CO2: 25 mmol/L (ref 22–32)
CO2: 24 mmol/L (ref 22–32)
Calcium: 8.2 mg/dL — ABNORMAL LOW (ref 8.9–10.3)
Calcium: 7.9 mg/dL — ABNORMAL LOW (ref 8.9–10.3)
Chloride: 97 mmol/L — ABNORMAL LOW (ref 98–111)
Chloride: 101 mmol/L (ref 98–111)
Creatinine, Ser: 3.11 mg/dL — ABNORMAL HIGH (ref 0.61–1.24)
Creatinine, Ser: 4.3 mg/dL — ABNORMAL HIGH (ref 0.61–1.24)
GFR calc Af Amer: 22 mL/min — ABNORMAL LOW (ref >60)
GFR calc Af Amer: 15 mL/min — ABNORMAL LOW (ref >60)
GFR calc non Af Amer: 19 mL/min — ABNORMAL LOW (ref >60)
GFR calc non Af Amer: 13 mL/min — ABNORMAL LOW (ref >60)
Glucose, Bld: 214 mg/dL — ABNORMAL HIGH (ref 70–99)
Glucose, Bld: 214 mg/dL — ABNORMAL HIGH (ref 70–99)
Phosphorus: 4.3 mg/dL (ref 2.5–4.6)
Phosphorus: 6.6 mg/dL — ABNORMAL HIGH (ref 2.5–4.6)
Potassium: 6.2 mmol/L — ABNORMAL HIGH (ref 3.5–5.1)
Potassium: 6.6 mmol/L (ref 3.5–5.1)
Sodium: 132 mmol/L — ABNORMAL LOW (ref 135–145)
Sodium: 136 mmol/L (ref 135–145)

## 2020-04-01 LAB — BLOOD GAS, ARTERIAL
Acid-Base Excess: 0.4 mmol/L (ref 0.0–2.0)
Acid-base deficit: 3.9 mmol/L — ABNORMAL HIGH (ref 0.0–2.0)
Acid-base deficit: 1.4 mmol/L (ref 0.0–2.0)
Bicarbonate: 20.3 mmol/L (ref 20.0–28.0)
Bicarbonate: 22 mmol/L (ref 20.0–28.0)
Bicarbonate: 25.4 mmol/L (ref 20.0–28.0)
Drawn by: 53675
Drawn by: 53675
FIO2: 80
FIO2: 70
FIO2: 100
O2 Saturation: 99.2 %
O2 Saturation: 99.3 %
O2 Saturation: 99.3 %
Patient temperature: 37
Patient temperature: 37
Patient temperature: 37
pCO2 arterial: 35.2 mmHg (ref 32.0–48.0)
pCO2 arterial: 31.3 mmHg — ABNORMAL LOW (ref 32.0–48.0)
pCO2 arterial: 47.3 mmHg (ref 32.0–48.0)
pH, Arterial: 7.379 (ref 7.350–7.450)
pH, Arterial: 7.46 — ABNORMAL HIGH (ref 7.350–7.450)
pH, Arterial: 7.349 — ABNORMAL LOW (ref 7.350–7.450)
pO2, Arterial: 166 mmHg — ABNORMAL HIGH (ref 83.0–108.0)
pO2, Arterial: 170 mmHg — ABNORMAL HIGH (ref 83.0–108.0)
pO2, Arterial: 203 mmHg — ABNORMAL HIGH (ref 83.0–108.0)

## 2020-04-01 LAB — POCT I-STAT 7, (LYTES, BLD GAS, ICA,H+H)
Acid-Base Excess: 2 mmol/L (ref 0.0–2.0)
Acid-base deficit: 2 mmol/L (ref 0.0–2.0)
Bicarbonate: 25.7 mmol/L (ref 20.0–28.0)
Bicarbonate: 22.8 mmol/L (ref 20.0–28.0)
Calcium, Ion: 1.02 mmol/L — ABNORMAL LOW (ref 1.15–1.40)
Calcium, Ion: 1.05 mmol/L — ABNORMAL LOW (ref 1.15–1.40)
HCT: 25 % — ABNORMAL LOW (ref 39.0–52.0)
HCT: 30 % — ABNORMAL LOW (ref 39.0–52.0)
Hemoglobin: 8.5 g/dL — ABNORMAL LOW (ref 13.0–17.0)
Hemoglobin: 10.2 g/dL — ABNORMAL LOW (ref 13.0–17.0)
O2 Saturation: 100 %
O2 Saturation: 100 %
Patient temperature: 98.1
Potassium: 6.1 mmol/L — ABNORMAL HIGH (ref 3.5–5.1)
Potassium: 6 mmol/L — ABNORMAL HIGH (ref 3.5–5.1)
Sodium: 132 mmol/L — ABNORMAL LOW (ref 135–145)
Sodium: 135 mmol/L (ref 135–145)
TCO2: 27 mmol/L (ref 22–32)
TCO2: 24 mmol/L (ref 22–32)
pCO2 arterial: 36.7 mmHg (ref 32.0–48.0)
pCO2 arterial: 36.9 mmHg (ref 32.0–48.0)
pH, Arterial: 7.452 — ABNORMAL HIGH (ref 7.350–7.450)
pH, Arterial: 7.399 (ref 7.350–7.450)
pO2, Arterial: 306 mmHg — ABNORMAL HIGH (ref 83.0–108.0)
pO2, Arterial: 235 mmHg — ABNORMAL HIGH (ref 83.0–108.0)

## 2020-04-01 LAB — CBC
HCT: 31 % — ABNORMAL LOW (ref 39.0–52.0)
HCT: 27.8 % — ABNORMAL LOW (ref 39.0–52.0)
HCT: 29 % — ABNORMAL LOW (ref 39.0–52.0)
HCT: 29 % — ABNORMAL LOW (ref 39.0–52.0)
Hemoglobin: 9.5 g/dL — ABNORMAL LOW (ref 13.0–17.0)
Hemoglobin: 9.1 g/dL — ABNORMAL LOW (ref 13.0–17.0)
Hemoglobin: 9.4 g/dL — ABNORMAL LOW (ref 13.0–17.0)
Hemoglobin: 9.4 g/dL — ABNORMAL LOW (ref 13.0–17.0)
MCH: 29.9 pg (ref 26.0–34.0)
MCH: 29.4 pg (ref 26.0–34.0)
MCH: 29.6 pg (ref 26.0–34.0)
MCH: 28.6 pg (ref 26.0–34.0)
MCHC: 30.6 g/dL (ref 30.0–36.0)
MCHC: 32.7 g/dL (ref 30.0–36.0)
MCHC: 32.4 g/dL (ref 30.0–36.0)
MCHC: 32.4 g/dL (ref 30.0–36.0)
MCV: 97.5 fL (ref 80.0–100.0)
MCV: 89.7 fL (ref 80.0–100.0)
MCV: 91.2 fL (ref 80.0–100.0)
MCV: 88.1 fL (ref 80.0–100.0)
Platelets: 174 10*3/uL (ref 150–400)
Platelets: 152 10*3/uL (ref 150–400)
Platelets: 170 10*3/uL (ref 150–400)
Platelets: 131 10*3/uL — ABNORMAL LOW (ref 150–400)
RBC: 3.18 MIL/uL — ABNORMAL LOW (ref 4.22–5.81)
RBC: 3.1 MIL/uL — ABNORMAL LOW (ref 4.22–5.81)
RBC: 3.18 MIL/uL — ABNORMAL LOW (ref 4.22–5.81)
RBC: 3.29 MIL/uL — ABNORMAL LOW (ref 4.22–5.81)
RDW: 15.6 % — ABNORMAL HIGH (ref 11.5–15.5)
RDW: 15.4 % (ref 11.5–15.5)
RDW: 15.9 % — ABNORMAL HIGH (ref 11.5–15.5)
RDW: 16.1 % — ABNORMAL HIGH (ref 11.5–15.5)
WBC: 25.1 10*3/uL — ABNORMAL HIGH (ref 4.0–10.5)
WBC: 30.1 10*3/uL — ABNORMAL HIGH (ref 4.0–10.5)
WBC: 45.4 10*3/uL — ABNORMAL HIGH (ref 4.0–10.5)
WBC: 34.6 10*3/uL — ABNORMAL HIGH (ref 4.0–10.5)
nRBC: 0.4 % — ABNORMAL HIGH (ref 0.0–0.2)
nRBC: 0.4 % — ABNORMAL HIGH (ref 0.0–0.2)
nRBC: 0.3 % — ABNORMAL HIGH (ref 0.0–0.2)
nRBC: 0.2 % (ref 0.0–0.2)

## 2020-04-01 LAB — POCT I-STAT, CHEM 8
BUN: 49 mg/dL — ABNORMAL HIGH (ref 8–23)
BUN: 47 mg/dL — ABNORMAL HIGH (ref 8–23)
Calcium, Ion: 1.09 mmol/L — ABNORMAL LOW (ref 1.15–1.40)
Calcium, Ion: 1.05 mmol/L — ABNORMAL LOW (ref 1.15–1.40)
Chloride: 99 mmol/L (ref 98–111)
Chloride: 99 mmol/L (ref 98–111)
Creatinine, Ser: 3.9 mg/dL — ABNORMAL HIGH (ref 0.61–1.24)
Creatinine, Ser: 3.9 mg/dL — ABNORMAL HIGH (ref 0.61–1.24)
Glucose, Bld: 277 mg/dL — ABNORMAL HIGH (ref 70–99)
Glucose, Bld: 260 mg/dL — ABNORMAL HIGH (ref 70–99)
HCT: 30 % — ABNORMAL LOW (ref 39.0–52.0)
HCT: 30 % — ABNORMAL LOW (ref 39.0–52.0)
Hemoglobin: 10.2 g/dL — ABNORMAL LOW (ref 13.0–17.0)
Hemoglobin: 10.2 g/dL — ABNORMAL LOW (ref 13.0–17.0)
Potassium: 6.7 mmol/L (ref 3.5–5.1)
Potassium: 6.7 mmol/L (ref 3.5–5.1)
Sodium: 132 mmol/L — ABNORMAL LOW (ref 135–145)
Sodium: 135 mmol/L (ref 135–145)
TCO2: 26 mmol/L (ref 22–32)
TCO2: 30 mmol/L (ref 22–32)

## 2020-04-01 LAB — CBC WITH DIFFERENTIAL/PLATELET
Abs Immature Granulocytes: 0.46 10*3/uL — ABNORMAL HIGH (ref 0.00–0.07)
Basophils Absolute: 0 10*3/uL (ref 0.0–0.1)
Basophils Relative: 0 %
Eosinophils Absolute: 0 10*3/uL (ref 0.0–0.5)
Eosinophils Relative: 0 %
HCT: 27 % — ABNORMAL LOW (ref 39.0–52.0)
Hemoglobin: 8.5 g/dL — ABNORMAL LOW (ref 13.0–17.0)
Immature Granulocytes: 2 %
Lymphocytes Relative: 2 %
Lymphs Abs: 0.4 10*3/uL — ABNORMAL LOW (ref 0.7–4.0)
MCH: 29.1 pg (ref 26.0–34.0)
MCHC: 31.5 g/dL (ref 30.0–36.0)
MCV: 92.5 fL (ref 80.0–100.0)
Monocytes Absolute: 1.8 10*3/uL — ABNORMAL HIGH (ref 0.1–1.0)
Monocytes Relative: 9 %
Neutro Abs: 16.9 10*3/uL — ABNORMAL HIGH (ref 1.7–7.7)
Neutrophils Relative %: 87 %
Platelets: 153 10*3/uL (ref 150–400)
RBC: 2.92 MIL/uL — ABNORMAL LOW (ref 4.22–5.81)
RDW: 15.5 % (ref 11.5–15.5)
WBC: 19.5 10*3/uL — ABNORMAL HIGH (ref 4.0–10.5)
nRBC: 0.2 % (ref 0.0–0.2)

## 2020-04-01 LAB — GLUCOSE, CAPILLARY
Glucose-Capillary: 170 mg/dL — ABNORMAL HIGH (ref 70–99)
Glucose-Capillary: 178 mg/dL — ABNORMAL HIGH (ref 70–99)
Glucose-Capillary: 194 mg/dL — ABNORMAL HIGH (ref 70–99)
Glucose-Capillary: 197 mg/dL — ABNORMAL HIGH (ref 70–99)
Glucose-Capillary: 236 mg/dL — ABNORMAL HIGH (ref 70–99)
Glucose-Capillary: 260 mg/dL — ABNORMAL HIGH (ref 70–99)
Glucose-Capillary: 308 mg/dL — ABNORMAL HIGH (ref 70–99)

## 2020-04-01 LAB — MAGNESIUM
Magnesium: 2.4 mg/dL (ref 1.7–2.4)
Magnesium: 2.9 mg/dL — ABNORMAL HIGH (ref 1.7–2.4)

## 2020-04-01 LAB — PROTIME-INR
INR: 2.2 — ABNORMAL HIGH (ref 0.8–1.2)
Prothrombin Time: 24.7 seconds — ABNORMAL HIGH (ref 11.4–15.2)

## 2020-04-01 LAB — COOXEMETRY PANEL
Carboxyhemoglobin: 2.2 % — ABNORMAL HIGH (ref 0.5–1.5)
Methemoglobin: 1.1 % (ref 0.0–1.5)
O2 Saturation: 69.8 %
Total hemoglobin: 8.8 g/dL — ABNORMAL LOW (ref 12.0–16.0)

## 2020-04-01 LAB — LACTATE DEHYDROGENASE: LDH: 422 U/L — ABNORMAL HIGH (ref 98–192)

## 2020-04-01 LAB — PHOSPHORUS: Phosphorus: 5.7 mg/dL — ABNORMAL HIGH (ref 2.5–4.6)

## 2020-04-01 SURGERY — VIDEO ASSISTED THORACOSCOPY
Anesthesia: General | Site: Chest

## 2020-04-01 MED ORDER — PROPOFOL 10 MG/ML IV BOLUS
INTRAVENOUS | Status: AC
  Filled 2020-04-01: qty 20

## 2020-04-01 MED ORDER — VASOPRESSIN 20 UNIT/ML IV SOLN
0.0100 [IU]/min | INTRAVENOUS | Status: DC
  Administered 2020-04-01: 07:00:00 0.03 [IU]/min via INTRAVENOUS
  Administered 2020-04-02: 20:00:00 0.04 [IU]/min via INTRAVENOUS
  Administered 2020-04-02: 0.05 [IU]/min via INTRAVENOUS
  Administered 2020-04-04 – 2020-04-05 (×2): 0.03 [IU]/min via INTRAVENOUS
  Filled 2020-04-01 (×7): qty 2

## 2020-04-01 MED ORDER — CHLORHEXIDINE GLUCONATE 0.12% ORAL RINSE (MEDLINE KIT)
15.0000 mL | Freq: Two times a day (BID) | OROMUCOSAL | Status: DC
  Administered 2020-04-01 – 2020-04-07 (×13): 15 mL via OROMUCOSAL

## 2020-04-01 MED ORDER — PHENYLEPHRINE 40 MCG/ML (10ML) SYRINGE FOR IV PUSH (FOR BLOOD PRESSURE SUPPORT)
PREFILLED_SYRINGE | INTRAVENOUS | Status: AC
  Filled 2020-04-01: qty 10

## 2020-04-01 MED ORDER — SODIUM CHLORIDE 0.9 % IV SOLN: 10.0000 mL/h | Freq: Once | INTRAVENOUS | Status: DC

## 2020-04-01 MED ORDER — INSULIN REGULAR(HUMAN) IN NACL 100-0.9 UT/100ML-% IV SOLN
INTRAVENOUS | Status: DC
  Filled 2020-04-01 (×2): qty 100

## 2020-04-01 MED ORDER — ALBUMIN HUMAN 5 % IV SOLN
INTRAVENOUS | Status: AC
  Administered 2020-04-01: 250 mL
  Filled 2020-04-01: qty 250

## 2020-04-01 MED ORDER — ALBUMIN HUMAN 5 % IV SOLN: INTRAVENOUS | Status: DC | PRN

## 2020-04-01 MED ORDER — LACTATED RINGERS IV SOLN: INTRAVENOUS | Status: DC | PRN

## 2020-04-01 MED ORDER — ONDANSETRON HCL 4 MG/2ML IJ SOLN
INTRAMUSCULAR | Status: AC
  Filled 2020-04-01: qty 2

## 2020-04-01 MED ORDER — FENTANYL CITRATE (PF) 250 MCG/5ML IJ SOLN
INTRAMUSCULAR | Status: AC
  Filled 2020-04-01: qty 5

## 2020-04-01 MED ORDER — ROCURONIUM BROMIDE 10 MG/ML (PF) SYRINGE
PREFILLED_SYRINGE | INTRAVENOUS | Status: AC
  Filled 2020-04-01: qty 10

## 2020-04-01 MED ORDER — FENTANYL 2500MCG IN NS 250ML (10MCG/ML) PREMIX INFUSION
0.0000 ug/h | INTRAVENOUS | Status: DC
  Administered 2020-04-01: 50 ug/h via INTRAVENOUS
  Administered 2020-04-02: 75 ug/h via INTRAVENOUS
  Administered 2020-04-04: 13:00:00 25 ug/h via INTRAVENOUS
  Filled 2020-04-01 (×4): qty 250

## 2020-04-01 MED ORDER — EPINEPHRINE HCL 5 MG/250ML IV SOLN IN NS
INTRAVENOUS | Status: AC
  Filled 2020-04-01: qty 250

## 2020-04-01 MED ORDER — ARTIFICIAL TEARS OPHTHALMIC OINT
TOPICAL_OINTMENT | OPHTHALMIC | Status: AC
  Filled 2020-04-01: qty 3.5

## 2020-04-01 MED ORDER — METOCLOPRAMIDE HCL 5 MG/ML IJ SOLN: 10.0000 mg | Freq: Four times a day (QID) | INTRAMUSCULAR | Status: DC

## 2020-04-01 MED ORDER — VASOPRESSIN 20 UNIT/ML IV SOLN
INTRAVENOUS | Status: AC
  Filled 2020-04-01: qty 1

## 2020-04-01 MED ORDER — FENTANYL CITRATE (PF) 250 MCG/5ML IJ SOLN
INTRAMUSCULAR | Status: DC | PRN
  Administered 2020-04-01: 100 ug via INTRAVENOUS
  Administered 2020-04-01 (×3): 50 ug via INTRAVENOUS

## 2020-04-01 MED ORDER — EPINEPHRINE 1 MG/10ML IJ SOSY
PREFILLED_SYRINGE | INTRAMUSCULAR | Status: AC
  Filled 2020-04-01: qty 10

## 2020-04-01 MED ORDER — VASOPRESSIN 20 UNIT/ML IV SOLN
INTRAVENOUS | Status: DC | PRN
  Administered 2020-04-01 (×3): 1 [IU] via INTRAVENOUS

## 2020-04-01 MED ORDER — PRISMASOL BGK 0/2.5 32-2.5 MEQ/L REPLACEMENT SOLN
Status: DC
  Administered 2020-04-03: 1 via INTRAVENOUS_CENTRAL
  Filled 2020-04-01 (×11): qty 5000

## 2020-04-01 MED ORDER — FENTANYL CITRATE (PF) 100 MCG/2ML IJ SOLN
25.0000 ug | INTRAMUSCULAR | Status: DC | PRN
  Administered 2020-04-02 (×2): 25 ug via INTRAVENOUS

## 2020-04-01 MED ORDER — SODIUM CHLORIDE 0.9 % IV SOLN: INTRAVENOUS | Status: DC | PRN

## 2020-04-01 MED ORDER — POLYETHYLENE GLYCOL 3350 17 G PO PACK
17.0000 g | PACK | Freq: Every day | ORAL | Status: DC
  Administered 2020-04-01 – 2020-04-10 (×9): 17 g
  Filled 2020-04-01 (×11): qty 1

## 2020-04-01 MED ORDER — EPINEPHRINE HCL 5 MG/250ML IV SOLN IN NS
0.5000 ug/min | INTRAVENOUS | Status: DC
  Administered 2020-04-01: 15 ug/min via INTRAVENOUS
  Administered 2020-04-01 – 2020-04-02 (×4): 8 ug/min via INTRAVENOUS
  Administered 2020-04-03 (×2): 6 ug/min via INTRAVENOUS
  Administered 2020-04-04: 18:00:00 4 ug/min via INTRAVENOUS
  Administered 2020-04-04: 08:00:00 6 ug/min via INTRAVENOUS
  Filled 2020-04-01 (×10): qty 250

## 2020-04-01 MED ORDER — LIDOCAINE HCL (PF) 1 % IJ SOLN
INTRAMUSCULAR | Status: AC
  Filled 2020-04-01: qty 5

## 2020-04-01 MED ORDER — CEFAZOLIN SODIUM-DEXTROSE 2-4 GM/100ML-% IV SOLN: 2.0000 g | INTRAVENOUS | Status: DC

## 2020-04-01 MED ORDER — 0.9 % SODIUM CHLORIDE (POUR BTL) OPTIME
TOPICAL | Status: DC | PRN
  Administered 2020-04-01 (×2): 1000 mL

## 2020-04-01 MED ORDER — SODIUM ZIRCONIUM CYCLOSILICATE 10 G PO PACK
10.0000 g | PACK | Freq: Once | ORAL | Status: AC
  Administered 2020-04-01: 10 g
  Filled 2020-04-01: qty 1

## 2020-04-01 MED ORDER — DEXMEDETOMIDINE HCL IN NACL 400 MCG/100ML IV SOLN
0.4000 ug/kg/h | INTRAVENOUS | Status: DC
  Administered 2020-04-01 (×2): 0.5 ug/kg/h via INTRAVENOUS
  Filled 2020-04-01 (×3): qty 50
  Filled 2020-04-01: qty 100
  Filled 2020-04-01 (×4): qty 50

## 2020-04-01 MED ORDER — MIDAZOLAM HCL 2 MG/2ML IJ SOLN
INTRAMUSCULAR | Status: AC
  Filled 2020-04-01: qty 2

## 2020-04-01 MED ORDER — CALCIUM GLUCONATE-NACL 1-0.675 GM/50ML-% IV SOLN
1.0000 g | Freq: Once | INTRAVENOUS | Status: AC
  Administered 2020-04-01: 1000 mg via INTRAVENOUS
  Filled 2020-04-01: qty 50

## 2020-04-01 MED ORDER — LIDOCAINE HCL (PF) 1 % IJ SOLN
INTRAMUSCULAR | Status: AC
  Filled 2020-04-01: qty 30

## 2020-04-01 MED ORDER — QUETIAPINE FUMARATE 25 MG PO TABS
25.0000 mg | ORAL_TABLET | Freq: Every day | ORAL | Status: DC
  Administered 2020-04-01 – 2020-04-11 (×11): 25 mg
  Filled 2020-04-01 (×11): qty 1

## 2020-04-01 MED ORDER — WARFARIN 0.5 MG HALF TABLET
0.5000 mg | ORAL_TABLET | Freq: Once | ORAL | Status: DC
  Filled 2020-04-01: qty 1

## 2020-04-01 MED ORDER — CEFUROXIME SODIUM 1.5 G IV SOLR
1.5000 g | Freq: Two times a day (BID) | INTRAVENOUS | Status: DC
  Administered 2020-04-01 (×2): 1.5 g via INTRAVENOUS
  Filled 2020-04-01 (×3): qty 1.5

## 2020-04-01 MED ORDER — FENTANYL CITRATE (PF) 100 MCG/2ML IJ SOLN
25.0000 ug | INTRAMUSCULAR | Status: DC | PRN
  Administered 2020-04-01: 25 ug via INTRAVENOUS
  Filled 2020-04-01: qty 2

## 2020-04-01 MED ORDER — SODIUM BICARBONATE 8.4 % IV SOLN
INTRAVENOUS | Status: DC | PRN
  Administered 2020-04-01: 50 meq via INTRAVENOUS

## 2020-04-01 MED ORDER — INSULIN REGULAR(HUMAN) IN NACL 100-0.9 UT/100ML-% IV SOLN: INTRAVENOUS | Status: DC

## 2020-04-01 MED ORDER — MIDAZOLAM HCL 2 MG/2ML IJ SOLN
INTRAMUSCULAR | Status: DC | PRN
  Administered 2020-04-01: 2 mg via INTRAVENOUS

## 2020-04-01 MED ORDER — CALCIUM CHLORIDE 10 % IV SOLN
INTRAVENOUS | Status: AC
  Filled 2020-04-01: qty 10

## 2020-04-01 MED ORDER — ORAL CARE MOUTH RINSE
15.0000 mL | OROMUCOSAL | Status: DC
  Administered 2020-04-01 – 2020-04-02 (×7): 15 mL via OROMUCOSAL

## 2020-04-01 MED ORDER — LIDOCAINE 2% (20 MG/ML) 5 ML SYRINGE
INTRAMUSCULAR | Status: AC
  Filled 2020-04-01: qty 5

## 2020-04-01 MED ORDER — POLYETHYLENE GLYCOL 3350 17 G PO PACK: 17.0000 g | PACK | Freq: Every day | ORAL | Status: DC

## 2020-04-01 MED ORDER — ROCURONIUM BROMIDE 10 MG/ML (PF) SYRINGE
PREFILLED_SYRINGE | INTRAVENOUS | Status: DC | PRN
  Administered 2020-04-01 (×4): 50 mg via INTRAVENOUS

## 2020-04-01 MED ORDER — INSULIN REGULAR(HUMAN) IN NACL 100-0.9 UT/100ML-% IV SOLN
INTRAVENOUS | Status: DC | PRN
  Administered 2020-04-01: 8.5 [IU]/h via INTRAVENOUS

## 2020-04-01 MED ORDER — METOCLOPRAMIDE HCL 5 MG/ML IJ SOLN
5.0000 mg | Freq: Three times a day (TID) | INTRAMUSCULAR | Status: DC
  Administered 2020-04-01 – 2020-04-12 (×32): 5 mg via INTRAVENOUS
  Filled 2020-04-01 (×31): qty 2

## 2020-04-01 MED FILL — Medication: Qty: 1 | Status: AC

## 2020-04-01 SURGICAL SUPPLY — 77 items
BAG DECANTER FOR FLEXI CONT (MISCELLANEOUS) IMPLANT
BENZOIN TINCTURE PRP APPL 2/3 (GAUZE/BANDAGES/DRESSINGS) ×4 IMPLANT
BLADE CLIPPER SURG (BLADE) ×4 IMPLANT
BLADE SURG 11 STRL SS (BLADE) ×8 IMPLANT
CANISTER SUCT 3000ML PPV (MISCELLANEOUS) ×4 IMPLANT
CATH KIT ON-Q SILVERSOAK 5IN (CATHETERS) IMPLANT
CATH ROBINSON RED A/P 22FR (CATHETERS) IMPLANT
CATH THORACIC 28FR (CATHETERS) ×4 IMPLANT
CATH THORACIC 28FR RT ANG (CATHETERS) IMPLANT
CATH THORACIC 36FR (CATHETERS) IMPLANT
CNTNR URN SCR LID CUP LEK RST (MISCELLANEOUS) ×6 IMPLANT
CONN 3/8X3/8 GISH STERILE (MISCELLANEOUS) ×4 IMPLANT
CONT SPEC 4OZ STRL OR WHT (MISCELLANEOUS) ×6
COVER SURGICAL LIGHT HANDLE (MISCELLANEOUS) ×8 IMPLANT
DERMABOND ADVANCED (GAUZE/BANDAGES/DRESSINGS)
DERMABOND ADVANCED .7 DNX12 (GAUZE/BANDAGES/DRESSINGS) IMPLANT
DRAIN CHANNEL 32F RND 10.7 FF (WOUND CARE) ×4 IMPLANT
DRAPE CARDIOVASC SPLIT 88X140 (DRAPES) ×4 IMPLANT
DRAPE LAPAROSCOPIC ABDOMINAL (DRAPES) ×4 IMPLANT
DRAPE ORTHO SPLIT 77X108 STRL (DRAPES) ×2
DRAPE SLUSH/WARMER DISC (DRAPES) ×4 IMPLANT
DRAPE SURG 17X23 STRL (DRAPES) ×4 IMPLANT
DRAPE SURG ORHT 6 SPLT 77X108 (DRAPES) ×2 IMPLANT
DRSG AQUACEL AG ADV 3.5X10 (GAUZE/BANDAGES/DRESSINGS) ×4 IMPLANT
ELECT REM PT RETURN 9FT ADLT (ELECTROSURGICAL) ×4
ELECTRODE REM PT RTRN 9FT ADLT (ELECTROSURGICAL) ×2 IMPLANT
GAUZE SPONGE 4X4 12PLY STRL (GAUZE/BANDAGES/DRESSINGS) ×4 IMPLANT
GLOVE BIO SURGEON STRL SZ7.5 (GLOVE) ×16 IMPLANT
GLOVE BIOGEL M STRL SZ7.5 (GLOVE) ×4 IMPLANT
GLOVE BIOGEL PI IND STRL 8 (GLOVE) ×2 IMPLANT
GLOVE BIOGEL PI INDICATOR 8 (GLOVE) ×2
GOWN STRL REUS W/ TWL LRG LVL3 (GOWN DISPOSABLE) ×6 IMPLANT
GOWN STRL REUS W/TWL LRG LVL3 (GOWN DISPOSABLE) ×6
KIT BASIN OR (CUSTOM PROCEDURE TRAY) ×4 IMPLANT
KIT SUCTION CATH 14FR (SUCTIONS) ×4 IMPLANT
KIT TURNOVER KIT B (KITS) ×4 IMPLANT
NS IRRIG 1000ML POUR BTL (IV SOLUTION) ×8 IMPLANT
PACK CHEST (CUSTOM PROCEDURE TRAY) ×4 IMPLANT
PAD ARMBOARD 7.5X6 YLW CONV (MISCELLANEOUS) ×8 IMPLANT
PASSER SUT SWANSON 36MM LOOP (INSTRUMENTS) ×4 IMPLANT
SEALANT SURG COSEAL 4ML (VASCULAR PRODUCTS) IMPLANT
SEALANT SURG COSEAL 8ML (VASCULAR PRODUCTS) ×4 IMPLANT
SHEET MEDIUM DRAPE 40X70 STRL (DRAPES) ×4 IMPLANT
SOL ANTI FOG 6CC (MISCELLANEOUS) ×2 IMPLANT
SOLUTION ANTI FOG 6CC (MISCELLANEOUS) ×2
SPONGE INTESTINAL PEANUT (DISPOSABLE) ×4 IMPLANT
SPONGE LAP 18X18 RF (DISPOSABLE) ×4 IMPLANT
SPONGE TONSIL TAPE 1 RFD (DISPOSABLE) ×8 IMPLANT
SUT CHROMIC 3 0 SH 27 (SUTURE) IMPLANT
SUT ETHILON 3 0 PS 1 (SUTURE) IMPLANT
SUT PROLENE 3 0 SH DA (SUTURE) ×4 IMPLANT
SUT PROLENE 4 0 RB 1 (SUTURE)
SUT PROLENE 4-0 RB1 .5 CRCL 36 (SUTURE) IMPLANT
SUT SILK  1 MH (SUTURE) ×6
SUT SILK 1 MH (SUTURE) ×6 IMPLANT
SUT SILK 2 0SH CR/8 30 (SUTURE) ×4 IMPLANT
SUT SILK 3 0SH CR/8 30 (SUTURE) IMPLANT
SUT VIC AB 1 CTX 18 (SUTURE) ×4 IMPLANT
SUT VIC AB 2 TP1 27 (SUTURE) ×8 IMPLANT
SUT VIC AB 2-0 CT2 18 VCP726D (SUTURE) IMPLANT
SUT VIC AB 2-0 CTX 36 (SUTURE) ×4 IMPLANT
SUT VIC AB 3-0 SH 18 (SUTURE) IMPLANT
SUT VIC AB 3-0 X1 27 (SUTURE) IMPLANT
SUT VICRYL 0 UR6 27IN ABS (SUTURE) IMPLANT
SUT VICRYL 2 TP 1 (SUTURE) IMPLANT
SWAB COLLECTION DEVICE MRSA (MISCELLANEOUS) IMPLANT
SWAB CULTURE ESWAB REG 1ML (MISCELLANEOUS) IMPLANT
SYSTEM SAHARA CHEST DRAIN ATS (WOUND CARE) ×4 IMPLANT
TAPE CLOTH SURG 6X10 WHT LF (GAUZE/BANDAGES/DRESSINGS) ×4 IMPLANT
TIP APPLICATOR SPRAY EXTEND 16 (VASCULAR PRODUCTS) ×4 IMPLANT
TOWEL GREEN STERILE (TOWEL DISPOSABLE) ×4 IMPLANT
TOWEL GREEN STERILE FF (TOWEL DISPOSABLE) ×4 IMPLANT
TRAP SPECIMEN MUCOUS 40CC (MISCELLANEOUS) IMPLANT
TRAY FOLEY MTR SLVR 16FR STAT (SET/KITS/TRAYS/PACK) IMPLANT
TROCAR XCEL NON-BLD 5MMX100MML (ENDOMECHANICALS) IMPLANT
TUNNELER SHEATH ON-Q 11GX8 DSP (PAIN MANAGEMENT) IMPLANT
WATER STERILE IRR 1000ML POUR (IV SOLUTION) ×4 IMPLANT

## 2020-04-01 NOTE — Anesthesia Procedure Notes (Signed)
Procedure Name: Intubation Date/Time: 04/05/2020 4:15 PM Performed by: Renato Shin, CRNA Pre-anesthesia Checklist: Patient identified, Emergency Drugs available, Suction available and Patient being monitored Patient Re-evaluated:Patient Re-evaluated prior to induction Oxygen Delivery Method: Circle system utilized Preoxygenation: Pre-oxygenation with 100% oxygen Induction Type: IV induction Ventilation: Mask ventilation without difficulty Laryngoscope Size: Miller and 2 Grade View: Grade I Tube type: Oral Tube size: 8.5 mm Number of attempts: 1 Placement Confirmation: ETT inserted through vocal cords under direct vision,  positive ETCO2 and breath sounds checked- equal and bilateral Secured at: 22 cm Tube secured with: Tape Dental Injury: Teeth and Oropharynx as per pre-operative assessment

## 2020-04-01 NOTE — Progress Notes (Signed)
Pt went to OR with a 8.0 ETT at 23cm and returned from OR with an 8.5 ETT at 25cm at the lip secured with pink tape. Pink tape removed and a Hollister tube holder was placed on the pt and the ETT resecured at 25cm. CXR pending at this time.

## 2020-04-01 NOTE — Code Documentation (Signed)
  Patient Name: [name] [name]   MRN: [id number]   Date of Birth/ Sex: 1951-07-27 , male      Admission Date: 02/26/2020  Attending Provider: Wonda Olds, MD  Primary Diagnosis: Cardiogenic shock [facility])   Indication: Pt was in his usual state of health until this AM, when he was noted to have agonal breathing. Code blue was subsequently called. At the time of arrival on scene, ACLS protocol was underway.    Technical Description:  - CPR performance duration:  0  minute - pt is an LVAD patient with partial code status: no CPR; difficult to find a pulse with LVAD; potentially 4 minutes without a pulse   - Was defibrillation or cardioversion used? No - patient has defibrillator and as of now we are unsure if he received a shock prior to staff witnessing the agonal episode  - Was external pacer placed? No  - Was patient intubated pre/post CPR? Yes   Medications Administered: Y = Yes; Blank = No Amiodarone    Atropine    Calcium    Epinephrine  x2  Lidocaine    Magnesium    Norepinephrine    Phenylephrine    Sodium bicarbonate    Vasopressin     Post CPR evaluation:  - Final Status - Was patient successfully resuscitated ? Yes - What is current rhythm? Sinus tach - What is current hemodynamic status? Tentatively HDS, intubated with sinus tach  Miscellaneous Information:  - Labs sent, including: CBC, BMP, Mag, Phos  - Primary team notified?  Yes  - Family Notified? Not   - Additional notes/ transfer status: Stat EKG, chest x-ray     Al Decant, MD  03/31/2020, 5:25 AM

## 2020-04-01 NOTE — Progress Notes (Signed)
12 Days Post-Op Procedure(s) (LRB): REDO STERNOTOMY WITH STERNAL PLATING (N/A) INSERTION OF IMPLANTABLE LEFT VENTRICULAR ASSIST DEVICE - HM3 (N/A) MAZE (N/A) TRANSESOPHAGEAL ECHOCARDIOGRAM (TEE) (N/A) Clipping Of Atrial Appendage (Left) Subjective: Patient had chest tube placed earlier this morning for removal of hemothorax-nonclotted and clotted blood.  Chest tube drainage has stopped but follow-up chest x-ray shows residual clot in the left hemithorax.  His hemodynamics remain suboptimal despite good LVAD flow-patient is requiring medium dose epinephrine and low-dose norepinephrine to maintain adequate mean arterial pressure.  Because of persistent hemothorax despite a large bore 68 French chest tube in good position and suboptimal hemodynamics the patient would benefit from a left VATS to remove clotted blood from the left pleural space.  I have discussed the patient's condition with the patient's daughter and will discuss consent for the left VATS which will be done this afternoon in the OR under anesthesia.  Objective: Vital signs in last 24 hours: Temp:  [93 F (33.9 C)-100 F (37.8 C)] 100 F (37.8 C) (04/20 1049) Pulse Rate:  [61-139] 126 (04/20 1200) Cardiac Rhythm: Sinus tachycardia (04/20 0800) Resp:  [13-26] 21 (04/20 1200) BP: (54-125)/(42-104) 85/57 (04/20 1200) SpO2:  [88 %-99 %] 98 % (04/20 1200) FiO2 (%):  [70 %-100 %] 70 % (04/20 1116)  Hemodynamic parameters for last 24 hours: CVP:  [7 mmHg-8 mmHg] 8 mmHg  Intake/Output from previous day: 04/19 0701 - 04/20 0700 In: 2068.2 [P.O.:540; I.V.:148.2; NG/GT:1120; IV Piggyback:260] Out: 1627  Intake/Output this shift: Total I/O In: 618.7 [I.V.:299.7; Blood:315; IV Piggyback:4] Out: 9476 [Chest Tube:1210]  Sedated on ventilator Normal VAD hum Chest tube with minimal output, no air leak Abdomen soft Extremities with minimal edema  Lab Results: Recent Labs    04/03/2020 0739 03/19/2020 1042  WBC 30.1* 45.4*  HGB  9.1* 9.4*  HCT 27.8* 29.0*  PLT 152 170   BMET:  Recent Labs    03/16/2020 0408 04/05/2020 0408 03/13/2020 0512 04/10/2020 0600  NA 132*   < > 132* 132*  K 6.2*   < > 5.9* 6.1*  CL 97*  --  96*  --   CO2 25  --  21*  --   GLUCOSE 214*  --  301*  --   BUN 42*  --  42*  --   CREATININE 3.11*  --  3.29*  --   CALCIUM 8.2*  --  8.6*  --    < > = values in this interval not displayed.    PT/INR:  Recent Labs    03/27/2020 0410  LABPROT 24.7*  INR 2.2*   ABG    Component Value Date/Time   PHART 7.379 04/10/2020 1015   HCO3 20.3 03/29/2020 1015   TCO2 27 03/31/2020 0600   ACIDBASEDEF 3.9 (H) 03/27/2020 1015   O2SAT 99.2 03/25/2020 1015   CBG (last 3)  Recent Labs    03/31/20 2357 04/05/2020 0357 03/22/2020 0834  GLUCAP 197* 236* 308*    Assessment/Plan: S/P Procedure(s) (LRB): REDO STERNOTOMY WITH STERNAL PLATING (N/A) INSERTION OF IMPLANTABLE LEFT VENTRICULAR ASSIST DEVICE - HM3 (N/A) MAZE (N/A) TRANSESOPHAGEAL ECHOCARDIOGRAM (TEE) (N/A) Clipping Of Atrial Appendage (Left) Return to OR for left VATS to evacuate left chest hematoma   LOS: 20 days    [name] [name] 03/16/2020

## 2020-04-01 NOTE — Progress Notes (Signed)
OT Cancellation Note  Patient Details Name: [name] [name] MRN: [id number] DOB: Dec 10, 1951   Cancelled Treatment:    Reason Eval/Treat Not Completed: Medical issues which prohibited therapy.  Events noted, pt now intubated. Will monitor for medical readiness for OT  [facility] C., OTR/L Acute Rehabilitation Services Pager [phone] Office [phone]   Lucille Passy M 03/30/2020, 6:46 AM

## 2020-04-01 NOTE — CV Procedure (Signed)
Arterial line placement.    Emergent consent assumed.  The right groin was prepped & draped sterilely. I was able to access the right femoral artery multiple times with good flows through needle despite significant vascular calcification. Unable to pass wire with multiple attempts. Pressure held.   The right wrist was then prepped and draped in the routine sterile fashion a single lumen radial arterial catheter was placed in the right radial artery using a modified Seldinger technique and u/s guidance. Good blood flow and wave forms. A dressing was placed.     Glori Bickers, MD  6:42 AM

## 2020-04-01 NOTE — Brief Op Note (Signed)
03/28/2020  4:46 PM  PATIENT:  [name] [name]  69 y.o. male  PRE-OPERATIVE DIAGNOSIS:  hemothorax  POST-OPERATIVE DIAGNOSIS:  hemothorax  PROCEDURE:  Procedure(s): VIDEO ASSISTED THORACOSCOPY (Left) TRANSESOPHAGEAL ECHOCARDIOGRAM (TEE) (N/A) Evacuation Hematoma (Left)  SURGEON:  Surgeon(s) and Role:    Ivin Poot, MD - Primary  PHYSICIAN ASSISTANT:   ASSISTANTS: w gold Pa   ANESTHESIA:   general  EBL:  300 nml   BLOOD ADMINISTERED:none  DRAINS: 2 L pleural drains   LOCAL MEDICATIONS USED:  NONE  SPECIMEN:  No Specimen  DISPOSITION OF SPECIMEN:  N/A  COUNTS:  YES  TOURNIQUET:  * No tourniquets in log *  DICTATION: .Dragon Dictation  PLAN OF CARE: return to 2 H  PATIENT DISPOSITION:  ICU - intubated and hemodynamically stable.   Delay start of Pharmacological VTE agent (>24hrs) due to surgical blood loss or risk of bleeding: yes

## 2020-04-01 NOTE — Progress Notes (Addendum)
ANTICOAGULATION CONSULT NOTE  Pharmacy Consult for warfarin Indication: post op LVAD  / Aflutter  Allergies  Allergen Reactions  . Liraglutide Nausea And Vomiting  . Lisinopril Cough    Patient Measurements: Height: 6' (182.9 cm) Weight: 101.1 kg (222 lb 14.2 oz)(head pillow and bed pad only on bed, controller lifted) IBW/kg (Calculated) : 77.6 Heparin Dosing Weight: 101.2 kg  Vital Signs: Temp: 100 F (37.8 C) (04/20 1049) Temp Source: Axillary (04/20 1015) BP: 57/49 (04/20 1049) Pulse Rate: 136 (04/20 1049)  Labs: Recent Labs    03/30/20 0324 03/30/20 0340 03/31/20 0339 03/31/20 0339 03/31/20 0425 03/31/20 1649 04/03/2020 0408 03/31/2020 0410 04/03/2020 0410 04/06/2020 0512 03/28/2020 0512 03/21/2020 0600 03/14/2020 0739  HGB 9.5*   < > 9.3*   < >   < >  --   --  8.5*   < > 9.5*   < > 8.5* 9.1*  HCT 29.4*   < > 28.7*   < >   < >  --   --  27.0*   < > 31.0*  --  25.0* 27.8*  PLT 134*   < > 134*   < >  --   --   --  153  --  174  --   --  152  LABPROT 24.3*  --  22.6*  --   --   --   --  24.7*  --   --   --   --   --   INR 2.2*  --  2.0*  --   --   --   --  2.2*  --   --   --   --   --   CREATININE  --    < > 1.96*  --    < > 1.98* 3.11*  --   --  3.29*  --   --   --    < > = values in this interval not displayed.    Estimated Creatinine Clearance: 25.7 mL/min (A) (by C-G formula based on SCr of 3.29 mg/dL (H)).   Medical History: Past Medical History:  Diagnosis Date  . Anxiety   . Arthritis   . CAD (coronary artery disease)    a.  1993 s/p MI - [employer];  b. s/p BMS to LAD '00;  c. PTCA 2nd diagonal 2010;  d. 02/18/12 Cath: moderate nonobs dzs - med rx;  e.  01/2015 Cath: LM nl, LAD 40-61m ISR, 70-65m/d, d1 90p (3.0x16 Synergy DES), D2 50-60, LCX nl, OM1 50p, 10m (2.5x12 Synergy DES), RCA nl, EF 30-35%.  . Chronic combined systolic and diastolic CHF (congestive heart failure) ([place])    a. 12/2014 Echo: EF 30-35%, Gr2 DD, mod MR, sev dil LA.  . CKD (chronic kidney  disease), stage III   . Depression   . ED (erectile dysfunction)   . GERD (gastroesophageal reflux disease)   . Hyperlipidemia   . Hypertension   . Ischemic cardiomyopathy    a. s/p St. [name] (Atlas) ICD implanted in [place] 2007;  b. 12/2014 Echo: Ef 30-35%.  . MVP (mitral valve prolapse)    a. s/p MV annuloplasty at [facility] 2004.  Marland Kitchen Persistent atrial fibrillation ([place])    a. noted on ICD interrogation '10 - not previously on [place] - CHA2DS2VASc = 5.  . Type II diabetes mellitus ([place])    uncontrolled    Medications:  Medications Prior to Admission  Medication Sig Dispense Refill Last Dose  . ALPRAZolam (  XANAX) 0.25 MG tablet Take 0.25 mg by mouth 3 (three) times daily as needed for anxiety.    unknown  . amiodarone (PACERONE) 200 MG tablet Take 200 mg by mouth daily.    03/11/2020 at Unknown time  . apixaban (ELIQUIS) 5 MG TABS tablet Take 1 tablet (5 mg total) by mouth 2 (two) times daily. 60 tablet 1 03/11/2020 at 0600  . atorvastatin (LIPITOR) 40 MG tablet Take 1 tablet (40 mg total) by mouth daily at 6 PM. 30 tablet 0 03/10/2020 at Unknown time  . citalopram (CELEXA) 20 MG tablet Take 20 mg by mouth daily.    03/11/2020 at Unknown time  . furosemide (LASIX) 40 MG tablet Take 40 mg by mouth daily.    03/11/2020 at Unknown time  . insulin aspart (NOVOLOG FLEXPEN) 100 UNIT/ML FlexPen Inject 6 Units into the skin 3 (three) times daily with meals. 15 mL 0 03/11/2020 at Unknown time  . Insulin Glargine (BASAGLAR KWIKPEN) 100 UNIT/ML SOPN Inject 0.5 mLs (50 Units total) into the skin daily. 15 mL 1 03/11/2020 at Unknown time  . metoprolol succinate (TOPROL-XL) 100 MG 24 hr tablet Take 1 tablet (100 mg total) by mouth daily. 90 tablet 3 03/11/2020 at 0600  . nitroGLYCERIN (NITROSTAT) 0.4 MG SL tablet Place 1 tablet (0.4 mg total) under the tongue every 5 (five) minutes x 3 doses as needed for chest pain. 10 tablet 0 unknown  . pantoprazole (PROTONIX) 40 MG tablet Take 1 tablet (40 mg total) by  mouth daily. 30 tablet 1 03/11/2020 at Unknown time  . potassium chloride SA (KLOR-CON) 20 MEQ tablet Take 20 mEq by mouth daily.    03/11/2020 at Unknown time  . tamsulosin (FLOMAX) 0.4 MG CAPS capsule Take 1 capsule (0.4 mg total) by mouth daily. 30 capsule 0 03/11/2020 at Unknown time  . ALPRAZolam (XANAX) 1 MG tablet Take 1 tablet (1 mg total) by mouth 2 (two) times daily as needed for anxiety. (Patient not taking: Reported on 02/29/2020) 10 tablet 0 Not Taking at Unknown time    Assessment: 23 YOM with atrial fibrillation on Eliquis PTA s/p R & L heart cath found to have 3v disease s/p  LVAD  Implant 4/8. Warfarin initiated on 4/9 after discussing with MD.  CT on 4/15 now found to have large right MCA stroke. INR had been elevated up to 9.0 postop, he received 1 dose of vitamin K 1 mg IV on 4/13 and again on 4/15 with INR still elevated at 6.2.   Warfarin resumed cautiously on 4/17, INR therapeutic at 2.2 today. Hgb down this AM. Is receiving 1 unit of FFP and 1 unit PRBC. Pltc wnl.  Patient is going back to OR today for VATS.   Goal of Therapy:  INR 2-2.5 Heparin level 0.3-0.7 units/ml Monitor platelets by anticoagulation protocol: Yes   Plan:  -Hold warfarin for now until back from OR - will clarify with Dr. Prescott Gum if able to resume warfarin tonight after procedure -Daily INR   Vertis Kelch, PharmD, [facility] PGY2 Cardiology Pharmacy Resident Phone [phone] 03/24/2020       11:11 AM  Please check [email or web address] for unit-specific pharmacist phone numbers

## 2020-04-01 NOTE — Progress Notes (Signed)
Patient with low flow alarms.  Upon assessment patient was agonal breathing and was immediately bagged, unable to doppler pressure.  Code blue activated, see code documentation.

## 2020-04-01 NOTE — Procedures (Signed)
Admit: 03/10/2020 LOS: 46  61M dialysis dependent AKI on CRRT, acute CHF exacerbation s/p VAD, status post MV repair, AFib s/p MAZE and LAA clipping 4/8; R MCA CVA 4/15  Current CRRT Prescription: Start Date: 4/13 Catheter: L Bruin Temp HD cath BFR: 200 Pre Blood Pump: 500 4K DFR: 1500 4K Replacement Rate: 200 4K Goal UF: net even Anticoagulation: systemic anticoagulation / warfarin Clotting: acceptable   S:  Code overnight, req CT for L hemothorax, intubated  Was taken off CRRT yesterday anticipating transition to iHD  No on VP and Epi  K 6.1 this AM  No UOP  O: 04/19 0701 - 04/20 0700 In: 2068.2 [P.O.:540; I.V.:148.2; NG/GT:1120; IV Piggyback:260] Out: 1627   Filed Weights   03/29/20 0600 03/30/20 0500 03/31/20 0354  Weight: 99.8 kg 101.4 kg 101.1 kg    Recent Labs  Lab 03/31/20 1649 03/31/20 1649 03/31/2020 0408 04/04/2020 0512 03/16/2020 0600  NA 130*   < > 132* 132* 132*  K 5.1   < > 6.2* 5.9* 6.1*  CL 95*  --  97* 96*  --   CO2 25  --  25 21*  --   GLUCOSE 351*  --  214* 301*  --   BUN 30*  --  42* 42*  --   CREATININE 1.98*  --  3.11* 3.29*  --   CALCIUM 8.0*  --  8.2* 8.6*  --   PHOS 4.4  --  4.3 5.7*  --    < > = values in this interval not displayed.   Recent Labs  Lab 03/30/20 0324 03/30/20 0340 03/31/20 0339 03/31/20 0425 04/08/2020 0410 03/23/2020 0512 03/14/2020 0600  WBC 13.7*   < > 13.3*  --  19.5* 25.1*  --   NEUTROABS 11.6*  --  11.4*  --  16.9*  --   --   HGB 9.5*   < > 9.3*   < > 8.5* 9.5* 8.5*  HCT 29.4*   < > 28.7*   < > 27.0* 31.0* 25.0*  MCV 90.5   < > 92.9  --  92.5 97.5  --   PLT 134*   < > 134*  --  153 174  --    < > = values in this interval not displayed.    Scheduled Meds: . sodium chloride   Intravenous Once  . amiodarone  200 mg Per Tube BID  . aspirin  81 mg Per Tube Daily  . atorvastatin  80 mg Per Tube q1800  . B-complex with vitamin C  1 tablet Per Tube Daily  . bisacodyl  10 mg Oral Daily   Or  . bisacodyl  10 mg  Rectal Daily  . chlorhexidine  15 mL Mouth Rinse BID  . chlorhexidine gluconate (MEDLINE KIT)  15 mL Mouth Rinse BID  . Chlorhexidine Gluconate Cloth  6 each Topical Daily  . docusate  200 mg Per Tube Daily  . EPINEPHrine NaCl      . feeding supplement (PRO-STAT SUGAR FREE 64)  30 mL Per Tube Daily  . influenza vaccine adjuvanted  0.5 mL Intramuscular Tomorrow-1000  . insulin aspart  0-24 Units Subcutaneous Q4H  . insulin aspart  3 Units Subcutaneous Q4H  . insulin glargine  24 Units Subcutaneous BID  . lidocaine (PF)      . mouth rinse  15 mL Mouth Rinse Q2H  . mouth rinse  15 mL Mouth Rinse 10 times per day  . melatonin  3 mg Per  Tube QHS  . pantoprazole (PROTONIX) IV  40 mg Intravenous Daily  . polyethylene glycol  17 g Oral Daily  . QUEtiapine  25 mg Oral QHS  . sildenafil  20 mg Per Tube TID  . sodium chloride flush  10-40 mL Intracatheter Q12H  . Warfarin - Pharmacist Dosing Inpatient   Does not apply q1600   Continuous Infusions: .  prismasol BGK 4/2.5 500 mL/hr at 03/31/20 0930  .  prismasol BGK 4/2.5 200 mL/hr at 03/31/20 1800  . sodium chloride Stopped (03/22/20 2011)  . sodium chloride Stopped (03/27/20 1523)  . sodium chloride    . sodium chloride 20 mL/hr at 03/27/20 0500  . dexmedetomidine (PRECEDEX) IV infusion Stopped (03/31/20 0656)  . epinephrine    . feeding supplement (VITAL 1.5 CAL) 1,000 mL (03/31/20 0954)  . lactated ringers Stopped (03/29/20 0748)  . milrinone 0.25 mcg/kg/min (03/27/2020 0100)  . norepinephrine (LEVOPHED) Adult infusion Stopped (03/21/20 1049)  . prismasol BGK 4/2.5 1,500 mL/hr at 03/31/20 1140  . vasopressin (PITRESSIN) infusion - *FOR SHOCK* 0.03 Units/min (04/10/2020 0709)   PRN Meds:.sodium chloride, acetaminophen, dextrose, fentaNYL (SUBLIMAZE) injection, fentaNYL (SUBLIMAZE) injection, heparin, heparin, hydrALAZINE, HYDROcodone-acetaminophen, levalbuterol, ondansetron (ZOFRAN) IV, ondansetron (ZOFRAN) IV, oxyCODONE, sodium chloride,  sodium chloride flush, sorbitol, traMADol  ABG    Component Value Date/Time   PHART 7.452 (H) 04/05/2020 0600   PCO2ART 36.7 03/30/2020 0600   PO2ART 306.0 (H) 03/22/2020 0600   HCO3 25.7 04/05/2020 0600   TCO2 27 04/02/2020 0600   ACIDBASEDEF 2.0 03/26/2020 0358   O2SAT 100.0 03/16/2020 0600    A/P  1. 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 Hypophosphatemia 3. sCHF s/p VAD 4/8 4. AFib s/p MAZE and LAA clipping 4/8 5. DM2 6. R MCA ischemic CVA 7. Coded overnight, L hemothorax s/p CT,  8. SHock on NE and VP, on milrinone  Resume CRRT, change post bath to 0K. No heparin in circuit.  Net even  Pearson Grippe, MD [employer]

## 2020-04-01 NOTE — Anesthesia Procedure Notes (Signed)
Central Venous Catheter Insertion Performed by: Oleta Mouse, MD, anesthesiologist Start/End04/19/2021 4:45 PM, 03/15/2020 5:01 PM Patient location: OR. Preanesthetic checklist: patient identified, IV checked, site marked, risks and benefits discussed, surgical consent, monitors and equipment checked, pre-op evaluation, timeout performed and anesthesia consent Patient sedated Hand hygiene performed  and maximum sterile barriers used  Catheter size: 9 Fr Total catheter length 10. MAC introducer Procedure performed using ultrasound guided technique. Ultrasound Notes:anatomy identified, needle tip was noted to be adjacent to the nerve/plexus identified, no ultrasound evidence of intravascular and/or intraneural injection and image(s) printed for medical record Attempts: 1 Following insertion, line sutured, dressing applied and Biopatch. Post procedure assessment: blood return through all ports, free fluid flow and no air  Patient tolerated the procedure well with no immediate complications.

## 2020-04-01 NOTE — Progress Notes (Signed)
Ct Surgery Procedure  Procedure-emergency placement left chest tube, 50 French for left hemothorax Surgeon Dahlia Byes MD Pre and postop diagnosis-left hemothorax Anesthesia-local 1% lidocaine  Due to the emergent nature of the procedure with the patient's blood pressure low a conventional informed consent was not possible.  Procedure  A brief timeout was performed while the patient was quickly prepped and draped in a sterile field over the left chest.  Lidocaine infiltration in the anterior axillary line at the left interspace.  Using standard technique a chest tube was placed into the posterior pleural space.  There is blood draining around the chest tube  but poor drainage through the the tube so I decided to place the tube in a different position due to the probable loculated fluid.  The tube was removed and the incision closed with silk sutures.  Local lidocaine was then infiltrated in the midclavicular line in the fourth interspace and a 38 French chest tube was placed and advanced without resistance.  This tube drained  nonclotted and clotted blood more effectively.  The chest tube was connected to Pleur-evac drainage suction and a sterile dressing applied.  A follow-up chest x-ray showed the tube to be in good position with improved aeration of the left lung and improved clearing of the left pleural space.

## 2020-04-01 NOTE — Progress Notes (Signed)
Patient ID: [name] [name], male   DOB: Jun 28, 1951, 69 y.o.   MRN: [id number]  This NP visited patient at the bedside as a follow up for palliative medicine needs and emotional support and to meet with [name]/ step daughter/main support person for ongoing discussion regarding GOCs..    Education offered on the patient's current medical situation.  Daughter verbalizes understanding of the patient's current medical situation and the seriousness of the "setbacks" that he has had since his LVAD surgery 04/08/2020.  Most concerning is the need for "kidney support", "he never wanted to be a burden".  Education offered, MOST form reviewed and Hard Choices booklet left for review  Family remains hopeful for improvement and are open to all offered and available medical interventions to prolong life "for now taking it one day at a time"     Family from out of town are coming to [place] this week-end, they may be bringing documents regarding AD and HPOA  Discussed with daughter the importance of continued conversation with her family and the  medical providers regarding overall plan of care and treatment options,  ensuring decisions are within the context of the patients values and GOCs.  Questions and concerns addressed    Emotional support offered    Total time spent on the unit was 35 minutes  Greater than 50% of the time was spent in counseling and coordination of care  This nurse practitioner informed  the Ander Purpura that I will be out of the hospital until Monday morning I will follow-up at that time.  Encouraged daughter to call palliative medicine team phone # [phone] with questions or concerns in the interim   Wadie Lessen NP  Palliative Medicine Team Team Phone # [phone] Pager [phone]

## 2020-04-01 NOTE — Anesthesia Procedure Notes (Signed)
Procedure Name: Intubation Date/Time: 03/14/2020 2:12 PM Performed by: Renato Shin, CRNA Pre-anesthesia Checklist: Patient identified, Emergency Drugs available, Suction available and Patient being monitored Patient Re-evaluated:Patient Re-evaluated prior to induction Preoxygenation: Pre-oxygenation with 100% oxygen Induction Type: Inhalational induction and IV induction Laryngoscope Size: Miller and 2 Grade View: Grade II Endobronchial tube: Left, Double lumen EBT, EBT position confirmed by auscultation and EBT position confirmed by fiberoptic bronchoscope and 39 Fr Number of attempts: 1 Airway Equipment and Method: Rigid stylet Placement Confirmation: positive ETCO2 and breath sounds checked- equal and bilateral Comments: Exchange over [place] catheter.

## 2020-04-01 NOTE — Progress Notes (Signed)
   ADVANCED HF/VAD TEAM NOTE  Called to see patient emergently after patient became agonal and with low flow states on LVAD. Was intubated emergently by RT and CCM at bedside.  On my arrival MAPs in 71s by Doppler on epi at 5. Flows on VAD in 3-4 range. Maps increased to 100 with bolus epi. Fluids ordered wide open,   I performed immediate bedside echo and no evidence of tamponade or effusion. Marked RV enlargement.   Dr. Prescott Gum arrived soon after. Stat CXR showed left-sided hemothorax. Hgb 9.3 -> 8.5 on labs.   2u RBC and 2uFFP ordered emergently and set up for emergent left chest tube.   I was unable to place right femoral arterial line due to calcium in the femoral artery. Using u/s guidance I placed right radial arterial line.   CTs placed with about 700cc of clotted blood out.   MAPs remained in 50s given a 3rd unit RBCs and albumin. EPI increased to 12. VP added. MAPs up to 70. Flow and PI on VAD restored.   On exam patient intubated and unresponsive Wandering side to side gaze Cor + LVAD hum incision ok  + left CT with diminished BS on left Ab soft NT DL site ok Ext cool no edema  He remains critically ill post evacuation of left hemothorax and recent VAD placement - watch H&H and VAD flows - wean epi as tolerated - no AC for now - follow neuro exam closely.  CCT 90 mins not including line placement. Greatly appreciate CCM team's assistance and prompt response.   Glori Bickers, MD  6:40 AM

## 2020-04-01 NOTE — Procedures (Signed)
Intubation Procedure Note OLLIVANDER SEE [id number] May 03, 1951  Procedure: Intubation Indications: Respiratory insufficiency  Procedure Details Consent: Unable to obtain consent because of emergent medical necessity. Time Out: Verified patient identification, verified procedure, site/side was marked, verified correct patient position, special equipment/implants available, medications/allergies/relevent history reviewed, required imaging and test results available.  Performed  Maximum sterile technique was used including gloves, hand hygiene and mask.  MAC and 4    Evaluation Hemodynamic Status: Unstable; O2 sats: Unstable Patient's Current Condition: unstable Complications: No apparent complications Patient did tolerate procedure well. Chest X-ray ordered to verify placement.  CXR: pending.  On arrival CODE BLUE initiated. No chest compressions were performed. Agonal respirations noted on arrival. Patient was intubated by Self. Airway difficulty was anticipated due to a Large Tongue. Once able to look and locate the larynx landmarks noted anterior larynx Grade 3 view noted. No complication were noted during intubation. 1 attempt was performed under Direct Laryngoscopy Mac 4 blade used. Vocal cords were visualized with some cricoid pressure. 8.0 ETT passed visibly through the cords w/o complications. Positive color change noted. BBS to auscultation were Equal bilaterally but Coarse aeration noted. Chest rise noted. ROSC was achieved and patient placed on Mechanical Ventilation. MD at bedside.  Leigh Aurora, BS, RRT, RCP 04/07/2020

## 2020-04-01 NOTE — Progress Notes (Signed)
PULMONARY / CRITICAL CARE MEDICINE   NAME:  [name] [name], MRN:  [id number], DOB:  Apr 20, 1951, LOS: 44 ADMISSION DATE:  03/11/2020, CONSULTATION DATE:  03/27/20 REFERRING MD:  Aundra Dubin, CHIEF COMPLAINT:  Respiratory failure  BRIEF HISTORY:    69 year old man with advanced ischemic cardiomyopathy, prior mitral valve repair, afib presented for redo sternotomy, LVAD implantation, left atrial appendage clipping, and sternal reconstruction on 03/16/2020.  Hospitalization complicated by renal failure, RV dysfunction, persistent delirium, and R MCA stroke.  Respiratory status has been tenuous.  On the morning of 4/20, patient became agonal with low flow on LVAD followed by intubation and two rounds of epi.  PCCM re-consulted.  HISTORY OF PRESENT ILLNESS   69 year old man with advanced ischemic cardiomyopathy, prior mitral valve repair, afib presented for redo sternotomy, LVAD implantation, left atrial appendage clipping, and sternal reconstruction on 03/19/2020.    Postoperative course complicated by persistent delirium, RV dysfunction, renal failure, abnormal INR of unclear etiology.  Started on CRRT 4/13.  Patient less responsive.  Found on CT head 4/15 with acute posterior R MCA stroke.  Neurology following.  Respiratory status has been tenuous post op as well in which PCCM initially consulted for.  Has remained mostly on BIPAP.  On 4/16, on 5 L [place] and saturating well. He is alert and oriented to self today (4/16).  PCCM available on as needed basis.  On the morning of 4/20, patient became agonal with low flow on LVAD followed by intubation and two rounds of epi.  PCCM re-consulted.  SIGNIFICANT PAST MEDICAL HISTORY   Ischemic cardiomyopathy MV prolapse s/p repair in 2004 HTN HLD CKD  SIGNIFICANT EVENTS:  4/8: redo sternotomy, LVAD implantation, left atrial appendage clipping, and sternal reconstruction  4/9: extubated 4/13 CRRT started  4/15 R MCA CVA on [facility] 4/16 off Bipap, on 5L [place] 4/19 CRRT  stopped   STUDIES:   CT Head 03/27/20 IMPRESSION: Large posterior right MCA infarct, new since prior study.  CT a/p 04/08/2020 IMPRESSION: 1. No acute intra-abdominal or pelvic pathology. No bowel obstruction. Normal appendix. 2. Cirrhosis with small perihepatic ascites. 3. Cholelithiasis. No evidence of acute cholecystitis by CT. 4. Aortic Atherosclerosis (ICD10-I70.0).  4/15 CXR volume overload  Echo 4/10 1. Images are poor quality and cannot comment on global LV function. .  Left ventricular ejection fraction, by estimation, is 20 to 25%. The left  ventricle has severely decreased function. The left ventricle demonstrates  global hypokinesis. There is  moderate concentric left ventricular hypertrophy.  2. Aortic dilatation noted. There is mild dilatation of the ascending  aorta and of the aortic root measuring 41 mm and 84mm respectively.  3. Right ventricular systolic function is severely reduced. The right  ventricular size is moderately enlarged.  4. The aortic valve is tricuspid. Mild aortic valve sclerosis is present,  with no evidence of aortic valve stenosis.   CULTURES:  COVID swab neg on admission MRSA PCR neg 4/7 Blood cultures x 2 4/13 neg Urine culture 4/12 neg  ANTIBIOTICS:  Vanc 4/12 - 4/17 Cefepime 4/11 - 4/17  LINES/TUBES:  4/8: redo sternotomy, LVAD implantation, left atrial appendage clipping, and sternal reconstruction   RIJ introducer >> R [place] HD catheter >> R radial A line >> out R/L pleural drains  >> out  4/20 ETT >> 4/20 R radial aline >> 4/20 left chest tube >>  CONSULTANTS:  CHF, TCTS, PCCM, Nephro, Neuro, Hematology  SUBJECTIVE:  Became agonal and with low flow states on  LVAD followed by intubation by RT and two rounds of epi with possibility of absence of pulses for 4 minutes.  Remains on epi gtt.  HF and TCTS at bedside  CONSTITUTIONAL: BP 97/81   Pulse (!) 108   Temp 97.6 F (36.4 C) (Oral)   Resp (!) 21   Ht 6' (1.829  m)   Wt 101.1 kg Comment: head pillow and bed pad only on bed, controller lifted  SpO2 95%   BMI 30.23 kg/m   I/O last 3 completed shifts: In: 4287.7 [P.O.:1020; I.V.:537.8; NG/GT:2470; IV Piggyback:260] Out: 4034 [Other:3259]  CVP:  [8 mmHg-9 mmHg] 8 mmHg  Vent Mode: PRVC FiO2 (%):  [100 %] 100 % Set Rate:  [16 bmp] 16 bmp Vt Set:  [742 mL] 620 mL PEEP:  [5 cmH20] 5 cmH20  PHYSICAL EXAM: General:  Critically ill male unresponsive on MV HEENT: MM pink/moist, ETT, pupils 4/ not responsive Neuro: unresponsive CV: irir, weak central pulses, LVAD hum  PULM:  MV supported breaths, agonal over vent, diminished L >R, rhonchi on right GI: soft, ND, no foley   Extremities: warm/dry, trace LE edema  Skin: no rashes  RESOLVED PROBLEM LIST   ASSESSMENT AND PLAN    Acute respiratory failure - full MV support, PRVC 8cc/kg, rate22 - CXR now - Aline to be placed and ABG in 1 hour - PAD protocol with prn fentanyl / precedex if needed for RASS goal 0-/1 with bowel regimen  Hypotension  Suspected PEA arrest  - defer TTM given hemodynamic instability and possible bleeding  - noted to have Hgb drop 9.5-> 8.5 - getting fluid bolus and albumin - no obvious tamponade physiology on bedside TTE by HF - stat PRBC and FFP ordered by TCTS - epi gtt for MAP goal 65 - TCTS placing left chest tube  - post transfusion labs and trend  - worsening leukocytosis, afebrile, consider re-culturing if febrile - will need R IJ replaced when able, attempted L IJ 4/19 with unsuccessful placement thought to be related to obstruction from AICD wires  Cardiomyopathy s/p LVAD placement 4/8 - Management per TCT/CHF team - on coumadin and ASA, INR goal 2-3 ( therapeutic at 2.2 this morning)  Right MCA ischemic stroke  - Neuro consulted. Patient has good strength and sensation bilaterally and is able to follow commands.   Chronic afib - s/p maze and LAA clipping - on coumadin  Acute renal failure  - per  Nephrology, transitioned off CRRT 4/19, given overnight events, will need CRRT back most likely  - remains anuric - trend renal panel/ mag daily   DM type II uncontrolled - SSI, lantus   SUMMARY OF TODAY'S PLAN:  Continue to wean oxygen as needed.   Best Practice / Goals of Care / Disposition.   Diet: TF on hold Pain/Anxiety/Delirium protocol (if indicated): prn fentanyl VAP protocol (if indicated): yes DVT prophylaxis: Warfarin GI prophylaxis: PPI Glucose control: SSI Mobility: BR Code Status: partial- no compression given LVAD Family Communication: per primary Disposition: ICU  LABS  Glucose Recent Labs  Lab 03/31/20 1137 03/31/20 1143 03/31/20 1633 03/31/20 2034 03/31/20 2357 03/21/2020 0357  GLUCAP 351* 179* 209* 237* 197* 236*    BMET Recent Labs  Lab 03/30/20 1706 03/30/20 1706 03/31/20 0339 03/31/20 0425 03/31/20 1649  NA 135   < > 134* 135 130*  K 5.1   < > 4.9 5.0 5.1  CL 102  --  100  --  95*  CO2 24  --  26  --  25  BUN 31*  --  29*  --  30*  CREATININE 2.10*  --  1.96*  --  1.98*  GLUCOSE 273*  --  130*  --  351*   < > = values in this interval not displayed.    Liver Enzymes Recent Labs  Lab 03/29/20 1050 03/29/20 1720 03/30/20 0324 03/30/20 0730 03/30/20 1706 03/31/20 0339 03/31/20 1649  AST 31  --  27  --   --  30  --   ALT 25  --  24  --   --  21  --   ALKPHOS 124  --  119  --   --  116  --   BILITOT 1.3*  --  1.0  --   --  0.9  --   ALBUMIN 2.5*   < > 2.3*   < > 2.3* 2.2*  2.2* 2.4*   < > = values in this interval not displayed.    Electrolytes Recent Labs  Lab 03/30/20 0324 03/30/20 0730 03/30/20 1706 03/31/20 0339 03/31/20 1649 03/21/2020 0410  CALCIUM  --    < > 8.1* 8.3* 8.0*  --   MG 2.4  --   --  2.4  --  2.4  PHOS  --    < > 2.3* 2.2* 4.4  --    < > = values in this interval not displayed.    CBC Recent Labs  Lab 03/30/20 1706 03/30/20 1706 03/31/20 0339 03/31/20 0425 04/04/2020 0410  WBC 13.5*  --  13.3*   --  19.5*  HGB 9.5*   < > 9.3* 9.5* 8.5*  HCT 29.2*   < > 28.7* 28.0* 27.0*  PLT 131*  --  134*  --  153   < > = values in this interval not displayed.    ABG Recent Labs  Lab 03/29/20 0330 03/30/20 0340 03/31/20 0425  PHART 7.478* 7.519* 7.573*  PCO2ART 33.7 29.6* 29.0*  PO2ART 103.0 60.0* 137.0*    Coag's Recent Labs  Lab 03/30/20 0324 03/31/20 0339 03/26/2020 0410  INR 2.2* 2.0* 2.2*    Sepsis Markers No results for input(s): LATICACIDVEN, PROCALCITON, O2SATVEN in the last 168 hours.  Cardiac Enzymes No results for input(s): TROPONINI, PROBNP in the last 168 hours.  CCT 30 mins  Kennieth Rad, MSN, AGACNP-BC [facility] 03/23/2020, 6:31 AM

## 2020-04-01 NOTE — Accreditation Note (Signed)
VAT: Patient with multiple functional vascular access. Advised RN to place a consult when needed.

## 2020-04-01 NOTE — Progress Notes (Signed)
EVENING ROUNDS NOTE :     [address]       [place] [address]             [phone]                 Day of Surgery Procedure(s) (LRB): VIDEO ASSISTED THORACOSCOPY (Left) TRANSESOPHAGEAL ECHOCARDIOGRAM (TEE) (N/A) Evacuation Hematoma (Left)  Total Length of Stay:  LOS: 20 days  BP 104/83   Pulse (!) 117   Temp 98.8 F (37.1 C) (Axillary)   Resp (!) 22   Ht 6' (1.829 m)   Wt 101.1 kg Comment: head pillow and bed pad only on bed, controller lifted  SpO2 100%   BMI 30.23 kg/m   .Intake/Output      04/20 0701 - 04/21 0700   P.O.    I.V. (mL/kg) 1037.1 (10.3)   Blood 1332   NG/GT    IV Piggyback 550.1   Total Intake(mL/kg) 2919.2 (28.9)   Emesis/NG output 1000   Other    Stool    Blood 700   Chest Tube 1250   Total Output 2950   Net -30.8         .  prismasol BGK 4/2.5 500 mL/hr at 03/31/20 0930  . sodium chloride Stopped (03/22/20 2011)  . sodium chloride Stopped (03/27/20 1523)  . sodium chloride    . sodium chloride 10 mL/hr at 04/07/2020 1005  . sodium chloride    . dexmedetomidine 0.5 mcg/kg/hr (03/21/2020 1922)  . epinephrine 8 mcg/min (03/16/2020 1921)  . feeding supplement (VITAL 1.5 CAL) 1,000 mL (03/31/20 0954)  . fentaNYL infusion INTRAVENOUS 75 mcg/hr (03/29/2020 1920)  . lactated ringers Stopped (03/29/20 0748)  . milrinone 0.25 mcg/kg/min (04/08/2020 1923)  . norepinephrine (LEVOPHED) Adult infusion 14 mcg/min (03/28/2020 1602)  . prismasol BGK 0/2.5    . prismasol BGK 4/2.5 1,500 mL/hr at 03/31/20 1140  . vasopressin (PITRESSIN) infusion - *FOR SHOCK* 0.05 Units/min (03/24/2020 1919)     Lab Results  Component Value Date   WBC 34.6 (H) 03/16/2020   HGB 9.4 (L) 03/31/2020   HCT 29.0 (L) 04/07/2020   PLT 131 (L) 03/22/2020   GLUCOSE 214 (H) 03/26/2020   CHOL 89 04/10/2020   TRIG 69 04/10/2020   HDL 33 (L) 03/27/2020   LDLCALC 42 03/30/2020   ALT 21 03/31/2020   AST 30 03/31/2020   NA 136 03/27/2020   K 6.6 (HH) 03/26/2020   CL 101 03/29/2020   CREATININE 4.30 (H) 03/31/2020   BUN 52 (H) 03/16/2020   CO2 24 03/28/2020   TSH 9.134 (H) 03/13/2020   INR 2.2 (H) 03/17/2020   HGBA1C 10.5 (H) 04/07/2020   Back from OR after VATS for evac of hematoma Minor drainage from CT, most recent HCT- 29  Hemodynamics/VAD management per team John Giovanni, PA-C

## 2020-04-01 NOTE — Progress Notes (Signed)
Three units of PRBC's and 2 FFP transfused emergently, see green sheets in chart.

## 2020-04-01 NOTE — Transfer of Care (Signed)
Immediate Anesthesia Transfer of Care Note  Patient: [name] [name]  Procedure(s) Performed: VIDEO ASSISTED THORACOSCOPY (Left Chest) TRANSESOPHAGEAL ECHOCARDIOGRAM (TEE) (N/A ) Evacuation Hematoma (Left Chest)  Patient Location: PACU  Anesthesia Type:General  Level of Consciousness: sedated and Patient remains intubated per anesthesia plan  Airway & Oxygen Therapy: Patient remains intubated per anesthesia plan and Patient placed on Ventilator (see vital sign flow sheet for setting)  Post-op Assessment: Report given to RN and Post -op Vital signs reviewed and stable  Post vital signs: Reviewed and stable  Last Vitals:  Vitals Value Taken Time  BP    Temp    Pulse    Resp    SpO2      Last Pain:  Vitals:   04/10/2020 1015  TempSrc: Axillary  PainSc:       Patients Stated Pain Goal: 0 (90/12/22 4114)  Complications: No apparent anesthesia complications

## 2020-04-01 NOTE — Progress Notes (Signed)
LVAD Coordinator Rounding Note:  HM III LVAD implanted on 04/10/2020 with MAZE procedure by Dr Orvan Seen under destination therapy criteria due to advanced age. Primary Heart Failure Cardiologist is Dr. Haroldine Laws.   VAD Coordinator paged @ 5:00 am this morning; BS nurse reported patient's respirations became agonal, VAD started Low Flow alarming, and code was in process. Dr. Haroldine Laws and Dr. Darcey Nora notified; both responded to bedside.  Pt currently orally intubated with respirations over ventilator. RT at bedside, ET tube pulled back and secured.   Pt's gaze is side to side, does not follow commands.   Left chest tube with 1150 cc bloody drainage.   Vital signs: Temp: 98.1 HR: 137 Doppler Pressure:  A line: 81/65 (69) O2 Sat: UTO - vented 100% FIO2 Wt: 253.3>262.3>260.8>259.9>237.8>220>223>222.8 lbs   LVAD interrogation reveals:  Speed: 5300 Flow: 4.6 Power: 3.8w  PI: 3.1  Alarms: 6 low flows this am Events:  Rare PI  Hematocrit: 29  Fixed speed: 5300 Low speed limit: 5000  CRRT: on hold this am; plan to re-start per Neprhology   Drive Line: CDI. Every other day dressing changes per nurse champion or VAD coordinator.   VAD dressing removed and site care performed using sterile technique. Drive line exit site cleaned with Chlora prep applicators x 2, allowed to dry, and gauze dressing with silver strip re-applied. Exit site healing and unincorporated, the velour is fully implanted at exit site. Scant bloody drainage. No redness, tenderness, foul odor or rash noted. DL remains unincorporated;[address] line anchor secure.  Labs:  LDH trend: [id number]  INR trend: 1.3>6.2>9>4.4>6.2>2.2>2.4>2.2>2.0>2.2  CR: 3.57>3.98>3.18>2.44>2.41>2.0>2.1>1.96>3.29  Anticoagulation Plan: -INR Goal: 2.0-2.5 -ASA Dose: 81 mg   Respiratory: - extubated 4/9/2 - re-intubated 03/30/2020 s/p left hemothorax with cardiac arrest  Nitric Oxide: off 03/23/20  Blood  Products:  Intraop: 6 FFP  DDAVP  420 cell saver  04/10/2020>>4 FFP 03/21/20>> 1 RBC 03/22/20>>1 RBC  Post-op: 03/27/2020>>3 PC's, 2 FFP   Device: -St [name] -Therapies: VF 194 - therapy on  Drips: Milrinone 0.25 mcg/kg/min Epi 12  Vaso 0.03  Adverse Events on VAD: >>03/25/20 CVVH Started >>03/19/2020 left hemothorax; cardiac arrest  Pt Education:  1. Education inappropriate at this time, pt is unable to respond; re-intubated today.  2. No family at bedside.  Plan/Recommendations:  1. Call VAD coordinator for any equipment or drive line issues.  2. Every other day drive line dressing change per nurse champion or VAD coordinator.   Zada Girt RN [place] Coordinator  Office: [phone]  24/7 Pager: [phone]

## 2020-04-01 NOTE — Progress Notes (Addendum)
PULMONARY / CRITICAL CARE MEDICINE   NAME:  [name] [name], MRN:  [id number], DOB:  09-Nov-1951, LOS: 90 ADMISSION DATE:  03/07/2020, CONSULTATION DATE:  03/27/20 REFERRING MD:  Aundra Dubin, CHIEF COMPLAINT:  Respiratory failure  BRIEF HISTORY:    69 year old man with advanced ischemic cardiomyopathy, prior mitral valve repair, afib presented for redo sternotomy, LVAD implantation, left atrial appendage clipping, and sternal reconstruction on 03/19/2020.  Hospitalization complicated by renal failure, RV dysfunction, persistent delirium, and R MCA stroke.  Respiratory status has been tenuous.  On the morning of 4/20, patient became agonal with low flow on LVAD followed by intubation and two rounds of epi.  PCCM re-consulted.  HISTORY OF PRESENT ILLNESS   69 year old man with advanced ischemic cardiomyopathy, prior mitral valve repair, afib presented for redo sternotomy, LVAD implantation, left atrial appendage clipping, and sternal reconstruction on 04/04/2020.    Postoperative course complicated by persistent delirium, RV dysfunction, renal failure, abnormal INR of unclear etiology.  Started on CRRT 4/13.  Patient less responsive.  Found on CT head 4/15 with acute posterior R MCA stroke.  Neurology following.  Respiratory status has been tenuous post op as well in which PCCM initially consulted for.  Has remained mostly on BIPAP.  On 4/16, on 5 L [place] and saturating well. He is alert and oriented to self today (4/16).  PCCM available on as needed basis.  On the morning of 4/20, patient became agonal with low flow on LVAD followed by intubation and two rounds of epi.  PCCM re-consulted.  SIGNIFICANT PAST MEDICAL HISTORY   Ischemic cardiomyopathy MV prolapse s/p repair in 2004 HTN HLD CKD  SIGNIFICANT EVENTS:  4/8: redo sternotomy, LVAD implantation, left atrial appendage clipping, and sternal reconstruction  4/9: extubated 4/13 CRRT started  4/15 R MCA CVA on [facility] 4/16 off Bipap, on 5L [place] 4/19 CRRT  stopped, respiratory arrest, intubated, chest tube placed for L hemothorax   STUDIES:   CT Head 03/27/20 IMPRESSION: Large posterior right MCA infarct, new since prior study.  CT a/p 04/08/2020 IMPRESSION: 1. No acute intra-abdominal or pelvic pathology. No bowel obstruction. Normal appendix. 2. Cirrhosis with small perihepatic ascites. 3. Cholelithiasis. No evidence of acute cholecystitis by CT. 4. Aortic Atherosclerosis (ICD10-I70.0).  4/15 CXR volume overload  Echo 4/10 1. Images are poor quality and cannot comment on global LV function. .  Left ventricular ejection fraction, by estimation, is 20 to 25%. The left  ventricle has severely decreased function. The left ventricle demonstrates  global hypokinesis. There is  moderate concentric left ventricular hypertrophy.  2. Aortic dilatation noted. There is mild dilatation of the ascending  aorta and of the aortic root measuring 41 mm and 80mm respectively.  3. Right ventricular systolic function is severely reduced. The right  ventricular size is moderately enlarged.  4. The aortic valve is tricuspid. Mild aortic valve sclerosis is present,  with no evidence of aortic valve stenosis.   CULTURES:  COVID swab neg on admission MRSA PCR neg 4/7 Blood cultures x 2 4/13 neg Urine culture 4/12 neg  ANTIBIOTICS:  Vanc 4/12 - 4/17 Cefepime 4/11 - 4/17  LINES/TUBES:  4/8: redo sternotomy, LVAD implantation, left atrial appendage clipping, and sternal reconstruction   RIJ introducer >> R [place] HD catheter >> R radial A line >> out R/L pleural drains  >> out  4/20 ETT >> 4/20 R radial aline >> 4/20 left chest tube >>  CONSULTANTS:  CHF, TCTS, PCCM, Nephro, Neuro, Hematology  SUBJECTIVE:  Patient with respiratory arrest overnight. ROSC after 4 minutes. No CPR performed due to LVAD, did receive 2 epis. Intubated during code. Post-intubation CXR showed L hemothorax and chest tube placed by TCTS.   CONSTITUTIONAL: BP 97/81    Pulse (!) 108   Temp 97.6 F (36.4 C) (Oral)   Resp (!) 21   Ht 6' (1.829 m)   Wt 101.1 kg Comment: head pillow and bed pad only on bed, controller lifted  SpO2 95%   BMI 30.23 kg/m   I/O last 3 completed shifts: In: 3463.3 [P.O.:1020; I.V.:373.3; NG/GT:1810; IV Piggyback:260] Out: 2604 [Other:2604]  CVP:  [8 mmHg-9 mmHg] 8 mmHg  Vent Mode: PRVC FiO2 (%):  [100 %] 100 % Set Rate:  [16 bmp] 16 bmp Vt Set:  [122 mL] 620 mL PEEP:  [5 cmH20] 5 cmH20  PHYSICAL EXAM: General: chronically ill appearing elderly male, unresponsive  HEENT: ETT in place, no pupillary response  Neuro: unresponsive  CV: LVAD hum  PULM: diminished breath sounds bilaterally, bilateral ronchi, bloody drainge from chest tube  GI: soft, NTND, no foley  Extremities: trace pitting edema bilaterally Skin: warm   RESOLVED PROBLEM LIST    ASSESSMENT AND PLAN    # Acute hypoxic respiratory failure secondary to L hemothorax s/p L CT  - Continue full MV support, PRVC 8cc/kg  - PAD protocol with prn fentanyl / precedex if needed for RASS goal 0-/1 with bowel regimen - VAP precautions in place  - Follow up CXR today   # Hypotension, shock  # Suspected PEA arrest  - S/p IVF, albumin, 2 units of pRBCs and 1 of FFP with Hgb 8.5 this AM  - L hemothorax noted on post-intubation CXR  - No obvious tamponade physiology on bedside TTE by Dr. Haroldine Laws  - Epi gtt for MAP goal 65, wean as able  - L chest tube in place with 1400 cc output  - Follow up repeat respiratory and Bcx, collected 4/20 - Trend H/H  # ICM s/p LVAD placement 4/8 for destination therapy  - Management per TCT/CHF team - On coumadin and ASA, INR goal 2-3 - INR not checked today    # Right MCA cardioembolic stroke, suspected secondary to Afib  - Neuro signed off 4/17. Recommended continuing AC with ASA and warfarin   # Chronic afib - s/p maze and LAA clipping - on coumadin  # Acute renal failure on CKD  - Nephrology following  -  Transitioned off CRRT 4/19 but given overnight events, nephro recommending resuming CRRT  - remains anuric - trend renal panel/ mag daily   # DM type II uncontrolled - SSI, lantus   # GOC:  - Palliative care has been consulted for [place] discussion given decline in status    Best Practice / Goals of Care / Disposition.   Diet: TF on hold Pain/Anxiety/Delirium protocol (if indicated): fentanyl gtt VAP protocol (if indicated): yes DVT prophylaxis: Warfarin GI prophylaxis: PPI Glucose control: SSI Mobility: BR Code Status: partial- no compression given LVAD Family Communication: per primary Disposition: ICU  LABS  Glucose Recent Labs  Lab 03/31/20 1137 03/31/20 1143 03/31/20 1633 03/31/20 2034 03/31/20 2357 03/27/2020 0357  GLUCAP 351* 179* 209* 237* 197* 236*    BMET Recent Labs  Lab 03/31/20 1649 03/31/20 1649 03/27/2020 0408 04/10/2020 0512 03/15/2020 0600  NA 130*   < > 132* 132* 132*  K 5.1   < > 6.2* 5.9* 6.1*  CL 95*  --  97* 96*  --  CO2 25  --  25 21*  --   BUN 30*  --  42* 42*  --   CREATININE 1.98*  --  3.11* 3.29*  --   GLUCOSE 351*  --  214* 301*  --    < > = values in this interval not displayed.    Liver Enzymes Recent Labs  Lab 03/29/20 1050 03/29/20 1720 03/30/20 0324 03/30/20 0730 03/31/20 0339 03/31/20 1649 03/16/2020 0408  AST 31  --  27  --  30  --   --   ALT 25  --  24  --  21  --   --   ALKPHOS 124  --  119  --  116  --   --   BILITOT 1.3*  --  1.0  --  0.9  --   --   ALBUMIN 2.5*   < > 2.3*   < > 2.2*  2.2* 2.4* 2.3*   < > = values in this interval not displayed.    Electrolytes Recent Labs  Lab 03/31/20 0339 03/31/20 0339 03/31/20 1649 03/19/2020 0408 04/03/2020 0410 03/31/2020 0512  CALCIUM 8.3*   < > 8.0* 8.2*  --  8.6*  MG 2.4  --   --   --  2.4 2.9*  PHOS 2.2*   < > 4.4 4.3  --  5.7*   < > = values in this interval not displayed.    CBC Recent Labs  Lab 03/31/20 0339 03/31/20 0425 03/24/2020 0410 04/07/2020 0512  03/15/2020 0600  WBC 13.3*  --  19.5* 25.1*  --   HGB 9.3*   < > 8.5* 9.5* 8.5*  HCT 28.7*   < > 27.0* 31.0* 25.0*  PLT 134*  --  153 174  --    < > = values in this interval not displayed.    ABG Recent Labs  Lab 03/30/20 0340 03/31/20 0425 03/26/2020 0600  PHART 7.519* 7.573* 7.452*  PCO2ART 29.6* 29.0* 36.7  PO2ART 60.0* 137.0* 306.0*    Coag's Recent Labs  Lab 03/30/20 0324 03/31/20 0339 04/08/2020 0410  INR 2.2* 2.0* 2.2*    Sepsis Markers No results for input(s): LATICACIDVEN, PROCALCITON, O2SATVEN in the last 168 hours.  Cardiac Enzymes No results for input(s): TROPONINI, PROBNP in the last 168 hours.    PCCM attending:  69 year old gentleman advanced ischemic cardiomyopathy status post LVAD.  Patient underwent line placement yesterday.  Patient had hemothorax.  Had a cardiac arrest briefly last night.  Also hospital course complicated by right MCA stroke.  Patient taken to the operating room today for evacuation of right hemothorax.  During this event patient was placed on mechanical ventilation.  Pulmonary was consulted for recommendations and management.  BP (!) 85/57   Pulse (!) 126   Temp 100 F (37.8 C)   Resp (!) 21   Ht 6' (1.829 m)   Wt 101.1 kg Comment: head pillow and bed pad only on bed, controller lifted  SpO2 98%   BMI 30.23 kg/m   General: Elderly chronically ill-appearing gentleman intubated on mechanical life support LVAD in place. Heart: LVAD hum Lungs: Bilateral mechanically ventilated breath sounds, definite levels of ventilator dyssynchrony.  Labs reviewed: Chest x-ray: Reviewed  Assessment: Acute hypoxemic respiratory failure requiring mechanical ventilation Complex cardiac history, LVAD in place. Chronic systolic heart failure. Cardiac arrest Hemothorax  Plan: Patient remains on full mechanical vent support. I believe he needs some additional sedation to help with ventilator dyssynchrony. Plan start Precedex Would  also  give patient some fentanyl as needed. If this does not work may need to consider fentanyl infusion. I am sure he has some pain due to the recent chest tube insertion also now on mechanical support. We can reassess after patient returns from the operating room.  We appreciate consultation.  We will continue to follow with you. Please never hesitate to call or reach out with any questions.  This patient is critically ill with multiple organ system failure; which, requires frequent high complexity decision making, assessment, support, evaluation, and titration of therapies. This was completed through the application of advanced monitoring technologies and extensive interpretation of multiple databases. During this encounter critical care time was devoted to patient care services described in this note for 32 minutes.   Garner Nash, DO [facility] 03/14/2020 5:05 PM

## 2020-04-02 ENCOUNTER — Inpatient Hospital Stay (HOSPITAL_COMMUNITY): Payer: Medicare HMO

## 2020-04-02 DIAGNOSIS — N179 Acute kidney failure, unspecified: Secondary | ICD-10-CM | POA: Diagnosis not present

## 2020-04-02 DIAGNOSIS — R569 Unspecified convulsions: Secondary | ICD-10-CM

## 2020-04-02 DIAGNOSIS — J9601 Acute respiratory failure with hypoxia: Secondary | ICD-10-CM

## 2020-04-02 DIAGNOSIS — R570 Cardiogenic shock: Secondary | ICD-10-CM | POA: Diagnosis not present

## 2020-04-02 LAB — CBC WITH DIFFERENTIAL/PLATELET
Abs Immature Granulocytes: 0.29 10*3/uL — ABNORMAL HIGH (ref 0.00–0.07)
Basophils Absolute: 0.1 10*3/uL (ref 0.0–0.1)
Basophils Relative: 0 %
Eosinophils Absolute: 0 10*3/uL (ref 0.0–0.5)
Eosinophils Relative: 0 %
HCT: 27.1 % — ABNORMAL LOW (ref 39.0–52.0)
Hemoglobin: 8.8 g/dL — ABNORMAL LOW (ref 13.0–17.0)
Immature Granulocytes: 1 %
Lymphocytes Relative: 2 %
Lymphs Abs: 0.4 10*3/uL — ABNORMAL LOW (ref 0.7–4.0)
MCH: 28.5 pg (ref 26.0–34.0)
MCHC: 32.5 g/dL (ref 30.0–36.0)
MCV: 87.7 fL (ref 80.0–100.0)
Monocytes Absolute: 2.2 10*3/uL — ABNORMAL HIGH (ref 0.1–1.0)
Monocytes Relative: 8 %
Neutro Abs: 24 10*3/uL — ABNORMAL HIGH (ref 1.7–7.7)
Neutrophils Relative %: 89 %
Platelets: 129 10*3/uL — ABNORMAL LOW (ref 150–400)
RBC: 3.09 MIL/uL — ABNORMAL LOW (ref 4.22–5.81)
RDW: 16.5 % — ABNORMAL HIGH (ref 11.5–15.5)
WBC: 26.9 10*3/uL — ABNORMAL HIGH (ref 4.0–10.5)
nRBC: 0.1 % (ref 0.0–0.2)

## 2020-04-02 LAB — HEPATIC FUNCTION PANEL
ALT: 20 U/L (ref 0–44)
AST: 35 U/L (ref 15–41)
Albumin: 2.6 g/dL — ABNORMAL LOW (ref 3.5–5.0)
Alkaline Phosphatase: 78 U/L (ref 38–126)
Bilirubin, Direct: 0.6 mg/dL — ABNORMAL HIGH (ref 0.0–0.2)
Indirect Bilirubin: 0.8 mg/dL (ref 0.3–0.9)
Total Bilirubin: 1.4 mg/dL — ABNORMAL HIGH (ref 0.3–1.2)
Total Protein: 5.2 g/dL — ABNORMAL LOW (ref 6.5–8.1)

## 2020-04-02 LAB — RENAL FUNCTION PANEL
Albumin: 2.6 g/dL — ABNORMAL LOW (ref 3.5–5.0)
Albumin: 2.6 g/dL — ABNORMAL LOW (ref 3.5–5.0)
Anion gap: 11 (ref 5–15)
Anion gap: 12 (ref 5–15)
BUN: 45 mg/dL — ABNORMAL HIGH (ref 8–23)
BUN: 38 mg/dL — ABNORMAL HIGH (ref 8–23)
CO2: 24 mmol/L (ref 22–32)
CO2: 24 mmol/L (ref 22–32)
Calcium: 7.9 mg/dL — ABNORMAL LOW (ref 8.9–10.3)
Calcium: 7.9 mg/dL — ABNORMAL LOW (ref 8.9–10.3)
Chloride: 103 mmol/L (ref 98–111)
Chloride: 101 mmol/L (ref 98–111)
Creatinine, Ser: 3.61 mg/dL — ABNORMAL HIGH (ref 0.61–1.24)
Creatinine, Ser: 2.86 mg/dL — ABNORMAL HIGH (ref 0.61–1.24)
GFR calc Af Amer: 19 mL/min — ABNORMAL LOW (ref >60)
GFR calc Af Amer: 25 mL/min — ABNORMAL LOW (ref >60)
GFR calc non Af Amer: 16 mL/min — ABNORMAL LOW (ref >60)
GFR calc non Af Amer: 21 mL/min — ABNORMAL LOW (ref >60)
Glucose, Bld: 212 mg/dL — ABNORMAL HIGH (ref 70–99)
Glucose, Bld: 207 mg/dL — ABNORMAL HIGH (ref 70–99)
Phosphorus: 5.2 mg/dL — ABNORMAL HIGH (ref 2.5–4.6)
Phosphorus: 4.7 mg/dL — ABNORMAL HIGH (ref 2.5–4.6)
Potassium: 5.5 mmol/L — ABNORMAL HIGH (ref 3.5–5.1)
Potassium: 5.5 mmol/L — ABNORMAL HIGH (ref 3.5–5.1)
Sodium: 138 mmol/L (ref 135–145)
Sodium: 137 mmol/L (ref 135–145)

## 2020-04-02 LAB — COOXEMETRY PANEL
Carboxyhemoglobin: 1.6 % — ABNORMAL HIGH (ref 0.5–1.5)
Methemoglobin: 1 % (ref 0.0–1.5)
O2 Saturation: 73.9 %
Total hemoglobin: 11.1 g/dL — ABNORMAL LOW (ref 12.0–16.0)

## 2020-04-02 LAB — LACTATE DEHYDROGENASE: LDH: 349 U/L — ABNORMAL HIGH (ref 98–192)

## 2020-04-02 LAB — PROTIME-INR
INR: 2.3 — ABNORMAL HIGH (ref 0.8–1.2)
Prothrombin Time: 25.4 seconds — ABNORMAL HIGH (ref 11.4–15.2)

## 2020-04-02 LAB — PREPARE FRESH FROZEN PLASMA: Unit division: 0

## 2020-04-02 LAB — BPAM FFP
Blood Product Expiration Date: 202104242359
Blood Product Expiration Date: 202104242359
ISSUE DATE / TIME: 202104200531
ISSUE DATE / TIME: 202104200531
Unit Type and Rh: 600
Unit Type and Rh: 6200

## 2020-04-02 LAB — MAGNESIUM: Magnesium: 2.5 mg/dL — ABNORMAL HIGH (ref 1.7–2.4)

## 2020-04-02 LAB — GLUCOSE, CAPILLARY
Glucose-Capillary: 196 mg/dL — ABNORMAL HIGH (ref 70–99)
Glucose-Capillary: 185 mg/dL — ABNORMAL HIGH (ref 70–99)
Glucose-Capillary: 170 mg/dL — ABNORMAL HIGH (ref 70–99)
Glucose-Capillary: 193 mg/dL — ABNORMAL HIGH (ref 70–99)
Glucose-Capillary: 202 mg/dL — ABNORMAL HIGH (ref 70–99)
Glucose-Capillary: 219 mg/dL — ABNORMAL HIGH (ref 70–99)

## 2020-04-02 MED ORDER — SODIUM CHLORIDE 0.9 % IV SOLN
1.5000 g | Freq: Two times a day (BID) | INTRAVENOUS | Status: DC
  Filled 2020-04-02 (×2): qty 1.5

## 2020-04-02 MED ORDER — NOREPINEPHRINE 4 MG/250ML-% IV SOLN
0.0000 ug/min | INTRAVENOUS | Status: DC
  Administered 2020-04-02 – 2020-04-04 (×4): 5 ug/min via INTRAVENOUS
  Administered 2020-04-04: 23:00:00 6 ug/min via INTRAVENOUS
  Administered 2020-04-05: 20:00:00 12 ug/min via INTRAVENOUS
  Administered 2020-04-05: 10:00:00 4 ug/min via INTRAVENOUS
  Administered 2020-04-06: 08:00:00 9 ug/min via INTRAVENOUS
  Administered 2020-04-06: 14:00:00 11 ug/min via INTRAVENOUS
  Administered 2020-04-06: 02:00:00 14 ug/min via INTRAVENOUS
  Administered 2020-04-06: 22:00:00 12 ug/min via INTRAVENOUS
  Administered 2020-04-07: 22:00:00 14 ug/min via INTRAVENOUS
  Administered 2020-04-07: 03:00:00 13.013 ug/min via INTRAVENOUS
  Administered 2020-04-07: 09:00:00 11 ug/min via INTRAVENOUS
  Administered 2020-04-07: 25 ug/min via INTRAVENOUS
  Administered 2020-04-08: 11:00:00 12 ug/min via INTRAVENOUS
  Administered 2020-04-08: 17:00:00 10 ug/min via INTRAVENOUS
  Administered 2020-04-08: 18 ug/min via INTRAVENOUS
  Administered 2020-04-09: 21:00:00 3 ug/min via INTRAVENOUS
  Administered 2020-04-09: 06:00:00 5 ug/min via INTRAVENOUS
  Filled 2020-04-02 (×12): qty 250
  Filled 2020-04-02 (×2): qty 500
  Filled 2020-04-02 (×6): qty 250

## 2020-04-02 MED ORDER — SODIUM CHLORIDE 0.9 % IV SOLN
2.0000 g | Freq: Two times a day (BID) | INTRAVENOUS | Status: AC
  Administered 2020-04-02 – 2020-04-08 (×14): 2 g via INTRAVENOUS
  Filled 2020-04-02 (×14): qty 2

## 2020-04-02 MED ORDER — LINEZOLID 600 MG PO TABS
600.0000 mg | ORAL_TABLET | Freq: Two times a day (BID) | ORAL | Status: AC
  Administered 2020-04-02 – 2020-04-08 (×13): 600 mg
  Filled 2020-04-02 (×14): qty 1

## 2020-04-02 MED ORDER — VITAL 1.5 CAL PO LIQD
1000.0000 mL | ORAL | Status: DC
  Administered 2020-04-02 – 2020-04-13 (×12): 1000 mL
  Filled 2020-04-02 (×16): qty 1000

## 2020-04-02 MED ORDER — LINEZOLID 600 MG PO TABS
600.0000 mg | ORAL_TABLET | Freq: Two times a day (BID) | ORAL | Status: DC
  Administered 2020-04-02: 600 mg via ORAL
  Filled 2020-04-02 (×2): qty 1

## 2020-04-02 MED ORDER — AMIODARONE HCL IN DEXTROSE 360-4.14 MG/200ML-% IV SOLN
60.0000 mg/h | INTRAVENOUS | Status: AC
  Administered 2020-04-02: 60 mg/h via INTRAVENOUS
  Filled 2020-04-02: qty 200

## 2020-04-02 MED ORDER — PRO-STAT SUGAR FREE PO LIQD
30.0000 mL | Freq: Four times a day (QID) | ORAL | Status: DC
  Administered 2020-04-02 – 2020-04-14 (×47): 30 mL
  Filled 2020-04-02 (×46): qty 30

## 2020-04-02 MED ORDER — AMIODARONE HCL IN DEXTROSE 360-4.14 MG/200ML-% IV SOLN
30.0000 mg/h | INTRAVENOUS | Status: DC
  Administered 2020-04-02 – 2020-04-10 (×16): 30 mg/h via INTRAVENOUS
  Filled 2020-04-02 (×15): qty 200

## 2020-04-02 NOTE — Progress Notes (Signed)
Nutrition Follow-up  DOCUMENTATION CODES:   Not applicable  INTERVENTION:   Tube Feeding: Vital 1.5 at 20 ml/hr, goal rate of 50 ml/hr Pro-Stat 30 mL QID Provides 141 g of protein, 2200 kcals and 912 mL of free water Meets 100% estimated calorie and protein needs  Continue B-complex with C   NUTRITION DIAGNOSIS:   Increased nutrient needs related to post-op healing, chronic illness as evidenced by estimated needs.  Being addressed via TF   GOAL:   Patient will meet greater than or equal to 90% of their needs  Progressing  MONITOR:   PO intake, Supplement acceptance, Labs, Weight trends  REASON FOR ASSESSMENT:   Consult LVAD Eval  ASSESSMENT:   69 yo male admitted with acute on chronic CHF, AKI on CKD 3. Initial plan for CABG but with re-do sternotomy, poor targets and longstanding low EF plan for LVAD instead. PMH includes CAD, CKD, DM  3/31 Admit, Cath with severe 3v CAD 4/03 Swan placed  4/06 Teeth extraction 4/08 HeartMate 3 LVAD placed, re-do sternotomy, Intubated 4/09 Extubated 4/13 CRRT initiated 4/14 Cortrak placed, TF started 4/15 Head CT: right [facility] CVA 4/19 CRRT stopped, respiratory arrest requiring intubation 4/20 Hemothorax requiring emergent chest tube placement, VATS, CRRT re-initiated 4/21 Cortrak advanced to proximal duodenum; Concern for posturing, EEG   Cortrak advanced to post-pyloric position today. OG tube for decompression with minimal output. Plan to resume TF via Cortrak today  Patient is currently intubated on ventilator support, pt continues to require multiples pressors (levo, vaso, epi) but doses stable/trending down. MAP consistently >65. Sedated with fentanyl and precedex drips.  MV: 13.5 L/min Temp (24hrs), Avg:97.9 F (36.6 C), Min:97.4 F (36.3 C), Max:98.8 F (37.1 C)  Current wt 107.5 kg; weight up 15 pounds. EDW around 100 kg.   Labs: potassium 5.5, Creatinine 3.61, BUN 45, phosphorus 5.2 Meds: B-complex with C, ss  novolog, novolog q 4 hours, lantus BID  Diet Order:   Diet Order            Diet NPO time specified  Diet effective now              EDUCATION NEEDS:   Education needs have been addressed  Skin:  Skin Assessment: Reviewed RN Assessment  Last BM:  4/19  Height:   Ht Readings from Last 1 Encounters:  03/14/2020 6' (1.829 m)    Weight:   Wt Readings from Last 1 Encounters:  04/02/20 107.5 kg    BMI:  Body mass index is 32.14 kg/m.  Estimated Nutritional Needs:   Kcal:  2150 kcals  Protein:  120-150 g  Fluid:  1500-2000 mL   Kerman Passey MS, RDN, LDN, CNSC RD Pager Number and Weekend/On-Call After Hours Pager Located in [place]

## 2020-04-02 NOTE — Plan of Care (Signed)
  Problem: Respiratory: Goal: Will regain and/or maintain adequate ventilation Outcome: Progressing   Problem: Clinical Measurements: Goal: Ability to maintain clinical measurements within normal limits will improve Outcome: Progressing   Problem: Clinical Measurements: Goal: Respiratory complications will improve Outcome: Progressing   Problem: Clinical Measurements: Goal: Cardiovascular complication will be avoided Outcome: Progressing

## 2020-04-02 NOTE — Progress Notes (Signed)
ANTICOAGULATION CONSULT NOTE  Pharmacy Consult for warfarin Indication: post op LVAD  / Aflutter  Allergies  Allergen Reactions  . Liraglutide Nausea And Vomiting  . Lisinopril Cough    Patient Measurements: Height: 6' (182.9 cm) Weight: 107.5 kg (236 lb 15.9 oz) IBW/kg (Calculated) : 77.6 Heparin Dosing Weight: 101.2 kg  Vital Signs: Temp: 97.4 F (36.3 C) (04/21 0800) Temp Source: Axillary (04/21 0800) BP: 106/79 (04/21 0900) Pulse Rate: 112 (04/21 0945)  Labs: Recent Labs    03/31/20 0339 03/31/20 0425 04/03/2020 0410 03/28/2020 0512 03/13/2020 1042 04/07/2020 1405 04/05/2020 1616 04/08/2020 1616 04/09/2020 1726 04/02/20 0429  HGB 9.3*   < > 8.5*   < > 9.4*   < > 10.2*   < > 9.4* 8.8*  HCT 28.7*   < > 27.0*   < > 29.0*   < > 30.0*  --  29.0* 27.1*  PLT 134*   < > 153   < > 170  --   --   --  131* 129*  LABPROT 22.6*  --  24.7*  --   --   --   --   --   --  25.4*  INR 2.0*  --  2.2*  --   --   --   --   --   --  2.3*  CREATININE 1.96*   < >  --    < >  --    < > 3.90*  --  4.30* 3.61*   < > = values in this interval not displayed.    Estimated Creatinine Clearance: 24.1 mL/min (A) (by C-G formula based on SCr of 3.61 mg/dL (H)).   Medical History: Past Medical History:  Diagnosis Date  . Anxiety   . Arthritis   . CAD (coronary artery disease)    a.  1993 s/p MI - [employer];  b. s/p BMS to LAD '00;  c. PTCA 2nd diagonal 2010;  d. 02/18/12 Cath: moderate nonobs dzs - med rx;  e.  01/2015 Cath: LM nl, LAD 40-46m ISR, 70-53m/d, d1 90p (3.0x16 Synergy DES), D2 50-60, LCX nl, OM1 50p, 44m (2.5x12 Synergy DES), RCA nl, EF 30-35%.  . Chronic combined systolic and diastolic CHF (congestive heart failure) ([place])    a. 12/2014 Echo: EF 30-35%, Gr2 DD, mod MR, sev dil LA.  . CKD (chronic kidney disease), stage III   . Depression   . ED (erectile dysfunction)   . GERD (gastroesophageal reflux disease)   . Hyperlipidemia   . Hypertension   . Ischemic cardiomyopathy    a. s/p  St. [name] (Atlas) ICD implanted in [place] 2007;  b. 12/2014 Echo: Ef 30-35%.  . MVP (mitral valve prolapse)    a. s/p MV annuloplasty at [facility] 2004.  Marland Kitchen Persistent atrial fibrillation ([place])    a. noted on ICD interrogation '10 - not previously on [place] - CHA2DS2VASc = 5.  . Type II diabetes mellitus ([place])    uncontrolled    Medications:  Medications Prior to Admission  Medication Sig Dispense Refill Last Dose  . ALPRAZolam (XANAX) 0.25 MG tablet Take 0.25 mg by mouth 3 (three) times daily as needed for anxiety.    unknown  . amiodarone (PACERONE) 200 MG tablet Take 200 mg by mouth daily.    03/11/2020 at Unknown time  . apixaban (ELIQUIS) 5 MG TABS tablet Take 1 tablet (5 mg total) by mouth 2 (two) times daily. 60 tablet 1 03/11/2020 at 0600  . atorvastatin (  LIPITOR) 40 MG tablet Take 1 tablet (40 mg total) by mouth daily at 6 PM. 30 tablet 0 03/10/2020 at Unknown time  . citalopram (CELEXA) 20 MG tablet Take 20 mg by mouth daily.    03/11/2020 at Unknown time  . furosemide (LASIX) 40 MG tablet Take 40 mg by mouth daily.    03/11/2020 at Unknown time  . insulin aspart (NOVOLOG FLEXPEN) 100 UNIT/ML FlexPen Inject 6 Units into the skin 3 (three) times daily with meals. 15 mL 0 03/11/2020 at Unknown time  . Insulin Glargine (BASAGLAR KWIKPEN) 100 UNIT/ML SOPN Inject 0.5 mLs (50 Units total) into the skin daily. 15 mL 1 03/11/2020 at Unknown time  . metoprolol succinate (TOPROL-XL) 100 MG 24 hr tablet Take 1 tablet (100 mg total) by mouth daily. 90 tablet 3 03/11/2020 at 0600  . nitroGLYCERIN (NITROSTAT) 0.4 MG SL tablet Place 1 tablet (0.4 mg total) under the tongue every 5 (five) minutes x 3 doses as needed for chest pain. 10 tablet 0 unknown  . pantoprazole (PROTONIX) 40 MG tablet Take 1 tablet (40 mg total) by mouth daily. 30 tablet 1 03/11/2020 at Unknown time  . potassium chloride SA (KLOR-CON) 20 MEQ tablet Take 20 mEq by mouth daily.    03/11/2020 at Unknown time  . tamsulosin (FLOMAX) 0.4 MG  CAPS capsule Take 1 capsule (0.4 mg total) by mouth daily. 30 capsule 0 03/11/2020 at Unknown time  . ALPRAZolam (XANAX) 1 MG tablet Take 1 tablet (1 mg total) by mouth 2 (two) times daily as needed for anxiety. (Patient not taking: Reported on 03/11/2020) 10 tablet 0 Not Taking at Unknown time    Assessment: 58 YOM with atrial fibrillation on Eliquis PTA s/p R & L heart cath found to have 3v disease s/p  LVAD  Implant 4/8. Warfarin initiated on 4/9 after discussing with MD.  CT on 4/15 now found to have large right MCA stroke. INR had been elevated up to 9.0 postop, he received 1 dose of vitamin K 1 mg IV on 4/13 and again on 4/15 with INR still elevated at 6.2.   Warfarin resumed cautiously on 4/17. Held dose yesterday since he went to the OR for VATS.   INR therapeutic at 2.3 today. Hgb down to 8.8. Pltc down to 129. LDH down 349.   Goal of Therapy:  INR 2-2.5 Heparin level 0.3-0.7 units/ml Monitor platelets by anticoagulation protocol: Yes   Plan:  -Hold warfarin today again per MD -Daily INR  Vertis Kelch, PharmD, [facility] PGY2 Cardiology Pharmacy Resident Phone [phone] 04/02/2020       10:00 AM  Please check [email or web address] for unit-specific pharmacist phone numbers

## 2020-04-02 NOTE — Progress Notes (Signed)
Patient ID: [name] [name], male   DOB: 02-18-1951, 69 y.o.   MRN: [id number]    Advanced Heart Failure Rounding Note   Subjective:    - S/p HM3 VAD 4/8 w MAZE procedure + LAA clipping. - Extubated 4/9 - 4/12 LVAD speed increased to 5400 --> echo moderate-severe RV dysfunction, septum mildly left shifted, trivial pericardial effusion.  LVAD speed cut back to 5300.  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  - 4/20 developed hemothorax requiring emergent CT placement and eventual VATS  Remains intubated. Will respond to pain but not follow commands. RN concerned about posturing.   Remains on Epi 8 NE 10 at VP 0.05  MAPs 70-80s. Precedex off on low-dose fentanyl  CXR improved. 74%  Remains on CVVHD.  WBC 34.6 -> 26.9k  INR 2.3  LDH 467 => 453 => 454 => 474 => 428=>415 => 349   VAD Interrogation  Flow 4.4, Speed 5300, Power 3.7, Pulse Index 3.3   VAD interrogated personally. Parameters stable.   Objective:   Weight Range:  Vital Signs:   Temp:  [97.4 F (36.3 C)-100 F (37.8 C)] 97.4 F (36.3 C) (04/21 0800) Pulse Rate:  [72-139] 121 (04/21 0915) Resp:  [14-26] 16 (04/21 0915) BP: (54-127)/(43-101) 106/79 (04/21 0900) SpO2:  [91 %-100 %] 100 % (04/21 0915) Arterial Line BP: (77-114)/(63-85) 97/73 (04/21 0915) FiO2 (%):  [50 %-100 %] 50 % (04/21 0759) Weight:  [107.5 kg-108.3 kg] 107.5 kg (04/21 0500) Last BM Date: 03/31/20  Weight change: Filed Weights   03/31/20 0354 03/25/2020 2100 04/02/20 0500  Weight: 101.1 kg 108.3 kg 107.5 kg    Intake/Output:   Intake/Output Summary (Last 24 hours) at 04/02/2020 0925 Last data filed at 04/02/2020 0700 Gross per 24 hour  Intake 4569.19 ml  Output 2906 ml  Net 1663.19 ml       Physical Exam: General:  Will respond to pain. Will not follow commands.  HEENT: normal  + ETT +NG Neck: supple. RIJ cath  Carotids 2+ bilat; no bruits. No lymphadenopathy or thryomegaly appreciated. Cor: LVAD hum. Incisions  ok Lungs: Clear.  Left CT Abdomen: soft, nontender, non-distended. No hepatosplenomegaly. No bruits or masses. Good bowel sounds. Driveline site clean. Anchor in place.  Extremities: no cyanosis, clubbing, rash. trace edema  Neuro: Will respond to pain. Will not follow commands.   Telemetry: A fib 100-120s Personally reviewed   Labs: Basic Metabolic Panel: Recent Labs  Lab 03/30/20 0324 03/30/20 0340 03/31/20 0339 03/31/20 0425 03/31/20 1649 03/31/20 1649 04/07/2020 0408 04/09/2020 0408 03/24/2020 0410 03/30/2020 0512 03/25/2020 0600 03/15/2020 1405 03/16/2020 1518 03/22/2020 1616 04/07/2020 1726 04/02/20 0429  NA  --    < > 134*   < > 130*   < > 132*   < >  --  132*   < > 135 132* 135 136 138  K  --    < > 4.9   < > 5.1   < > 6.2*   < >  --  5.9*   < > 6.0* 6.7* 6.7* 6.6* 5.5*  CL  --    < > 100   < > 95*   < > 97*   < >  --  96*  --   --  99 99 101 103  CO2  --    < > 26   < > 25  --  25  --   --  21*  --   --   --   --  24 24  GLUCOSE  --    < > 130*   < > 351*   < > 214*   < >  --  301*  --   --  277* 260* 214* 212*  BUN  --    < > 29*   < > 30*   < > 42*   < >  --  42*  --   --  49* 47* 52* 45*  CREATININE  --    < > 1.96*   < > 1.98*   < > 3.11*   < >  --  3.29*  --   --  3.90* 3.90* 4.30* 3.61*  CALCIUM  --    < > 8.3*   < > 8.0*   < > 8.2*   < >  --  8.6*  --   --   --   --  7.9* 7.9*  MG 2.4  --  2.4  --   --   --   --   --  2.4 2.9*  --   --   --   --   --  2.5*  PHOS  --    < > 2.2*   < > 4.4  --  4.3  --   --  5.7*  --   --   --   --  6.6* 5.2*   < > = values in this interval not displayed.    Liver Function Tests: Recent Labs  Lab 03/27/20 0343 03/27/20 1541 03/29/20 1050 03/29/20 1720 03/30/20 0324 03/30/20 0730 03/31/20 0339 03/31/20 1649 04/10/2020 0408 03/15/2020 1726 04/02/20 0429  AST 38  --  31  --  27  --  30  --   --   --   --   ALT 25  --  25  --  24  --  21  --   --   --   --   ALKPHOS 94  --  124  --  119  --  116  --   --   --   --   BILITOT 1.3*  --   1.3*  --  1.0  --  0.9  --   --   --   --   PROT 5.7*  --  6.2*  --  5.6*  --  5.5*  --   --   --   --   ALBUMIN 2.6*  2.5*   < > 2.5*   < > 2.3*   < > 2.2*  2.2* 2.4* 2.3* 2.8* 2.6*   < > = values in this interval not displayed.   No results for input(s): LIPASE, AMYLASE in the last 168 hours. Recent Labs  Lab 03/27/20 1033  AMMONIA 35    CBC: Recent Labs  Lab 03/29/20 0318 03/29/20 0330 03/30/20 0324 03/30/20 0340 03/31/20 0339 03/31/20 0425 03/19/2020 0410 03/30/2020 0410 04/03/2020 0512 03/24/2020 0600 04/08/2020 0739 03/29/2020 0739 03/21/2020 1042 03/19/2020 1042 03/19/2020 1405 04/04/2020 1518 03/13/2020 1616 04/02/2020 1726 04/02/20 0429  WBC 13.8*   < > 13.7*   < > 13.3*   < > 19.5*   < > 25.1*  --  30.1*  --  45.4*  --   --   --   --  34.6* 26.9*  NEUTROABS 11.8*  --  11.6*  --  11.4*  --  16.9*  --   --   --   --   --   --   --   --   --   --   --  24.0*  HGB 10.2*   < > 9.5*   < > 9.3*   < > 8.5*   < > 9.5*   < > 9.1*   < > 9.4*   < > 10.2* 10.2* 10.2* 9.4* 8.8*  HCT 31.5*   < > 29.4*   < > 28.7*   < > 27.0*   < > 31.0*   < > 27.8*   < > 29.0*   < > 30.0* 30.0* 30.0* 29.0* 27.1*  MCV 92.4   < > 90.5   < > 92.9   < > 92.5   < > 97.5  --  89.7  --  91.2  --   --   --   --  88.1 87.7  PLT 152   < > 134*   < > 134*   < > 153   < > 174  --  152  --  170  --   --   --   --  131* 129*   < > = values in this interval not displayed.    Cardiac Enzymes: No results for input(s): CKTOTAL, CKMB, CKMBINDEX, TROPONINI in the last 168 hours.  BNP: BNP (last 3 results) Recent Labs    01/04/20 1642 03/21/20 0209 03/26/20 2329  BNP 165.2* 315.9* 425.7*    ProBNP (last 3 results) No results for input(s): PROBNP in the last 8760 hours.    Other results:  Imaging: DG Chest 1 View  Result Date: 03/19/2020 CLINICAL DATA:  Endotracheally intubated. EXAM: CHEST  1 VIEW COMPARISON:  1-1/2 hours prior. CT 02/26/2020 FINDINGS: Endotracheal tube tip at the clavicular heads. There are 2  enteric tubes in place, both tips in the proximal stomach. Right subclavian central venous catheter tip in the mid SVC. Right internal jugular central venous catheter tip in the lower SVC. Left-sided chest tubes in place. Left ventricular assist device in place. Post left atrial clipping. Stable cardiomegaly. Aortic atherosclerosis. Patchy opacity in the left mid lung, not significantly changed. Retrocardiac opacity may be worsened, possible pleural effusion. No visualized pneumothorax. Overall vascular congestion. IMPRESSION: 1. Endotracheal tube tip at the clavicular heads. 2. Two enteric tubes in place, both tips in the proximal stomach. Additional support apparatus unchanged. 3. Unchanged cardiomegaly, vascular congestion, and left midlung opacity. Retrocardiac opacity may be worsened. Electronically Signed   By: Keith Rake M.D.   On: 04/05/2020 19:01   DG Abd 1 View  Result Date: 03/15/2020 CLINICAL DATA:  Orogastric tube placement. EXAM: ABDOMEN - 1 VIEW COMPARISON:  March 26, 2020. FINDINGS: Moderately distended air-filled stomach is noted. No large or small bowel dilatation is noted. Distal tips of nasogastric tube and feeding tube are seen in the proximal stomach. No radio-opaque calculi or other significant radiographic abnormality are seen. IMPRESSION: Distal tips of nasogastric tube and feeding tube are seen in the proximal stomach. Moderate gastric distention is noted. Electronically Signed   By: Marijo Conception M.D.   On: 04/09/2020 12:38   DG Chest Port 1 View  Result Date: 04/02/2020 CLINICAL DATA:  Left ventricular assist device. EXAM: PORTABLE CHEST 1 VIEW COMPARISON:  04/05/2020 FINDINGS: The endotracheal tube, feeding tube, right IJ Cordis, right subclavian central venous catheters and left chest tube are stable. The left ventricular assist device appears stable. No complicating features are demonstrated. Stable cardiac enlargement, central vascular congestion and pulmonary edema.  Suspect persistent left effusion. IMPRESSION: 1. Stable support apparatus. 2. Persistent cardiac enlargement, central vascular congestion and pulmonary  edema. 3. Suspect left pleural effusion and left lower lobe atelectasis. Electronically Signed   By: Marijo Sanes M.D.   On: 04/02/2020 08:59   DG Chest Portable 1 View  Result Date: 04/03/2020 CLINICAL DATA:  Line placement EXAM: PORTABLE CHEST 1 VIEW COMPARISON:  03/13/2020 FINDINGS: Support Apparatus: --Endotracheal tube: Tip at the level of the clavicular heads. --Enteric tube:Tip projects over the stomach --Catheter(s):Right IJ central venous catheter tip at the cavoatrial junction. --Other: Left chest wall AICD leads in expected position. Right IJ sheath with tip projecting over the mid SVC. Left ventricular assist device. Moderate opacity in the left lung, improved from the prior study. Left chest tube tips project at the left lung apex. IMPRESSION: 1. Right IJ central venous catheter tip at the cavoatrial junction. 2. Radiographically appropriate position of endotracheal tube. 3. Improved aeration of the left lung compared to the prior study. Electronically Signed   By: Ulyses Jarred M.D.   On: 03/27/2020 17:32   DG Chest Port 1 View  Result Date: 03/30/2020 CLINICAL DATA:  Left pleural effusion. EXAM: PORTABLE CHEST 1 VIEW COMPARISON:  Radiograph of same day. FINDINGS: Endotracheal and feeding tubes are unchanged in position. Right subclavian catheter is unchanged in position. Left ventricular assist device is unchanged. Left-sided pacemaker is unchanged. No pneumothorax is noted. There is complete opacification of the left hemithorax consistent with effusion and/or atelectasis. Left-sided chest tube is unchanged in position. Mild right basilar atelectasis or edema is noted. Bony thorax is unremarkable. IMPRESSION: Stable support apparatus. Continued diffuse opacification of left hemithorax as described above. Mild right basilar atelectasis or  edema. Electronically Signed   By: Marijo Conception M.D.   On: 04/07/2020 12:40   DG CHEST PORT 1 VIEW  Result Date: 03/30/2020 CLINICAL DATA:  Left ventricular assist device. EXAM: PORTABLE CHEST 1 VIEW COMPARISON:  One-view chest x-ray 03/16/2020 FINDINGS: The endotracheal tube feeding tube, and right subclavian line are stable. Left-sided chest tube remains. Pacing and defibrillator wires are stable. Left ventricular assist device is stable. The left lung remains opacified. Mild edema in the right lung is stable. IMPRESSION: 1. Stable positioning of left ventricular assist device. 2. Support apparatus is stable. 3. Persistent opacification of the left hemithorax. 4. Stable right-sided edema. Electronically Signed   By: San Morelle M.D.   On: 03/23/2020 06:47   DG CHEST PORT 1 VIEW  Result Date: 03/16/2020 CLINICAL DATA:  Intubation. EXAM: PORTABLE CHEST 1 VIEW COMPARISON:  03/31/2020. FINDINGS: Interim intubation. Endotracheal tube tip 3 cm above the carina. Interim removal of left central line. Right subclavian line and right IJ sheath in stable position. Feeding tube tip below left hemidiaphragm. AICD and left ventricular assist device in stable position. Prior median sternotomy. Left atrial appendage clip in stable position. Stable cardiomegaly. Diffuse bilateral pulmonary infiltrates, again noted. Increased opacification left hemithorax possibly with secondary to prominent left pleural effusion. No pneumothorax. IMPRESSION: 1. Interim intubation. Endotracheal tube tip 3 cm above the carina. 2. Interim removal of left central line. Remaining lines and tubes in stable position. 3. AICD left ventricular assist device in stable position. Prior median sternotomy. Left atrial appendage clip in stable position. Stable cardiomegaly. 4. Bilateral pulmonary infiltrates again noted. Increased opacification of left hemithorax most likely secondary to prominent left pleural effusion. No pneumothorax.  Electronically Signed   By: Marcello Moores  Register   On: 03/23/2020 05:58   DG CHEST PORT 1 VIEW  Result Date: 03/31/2020 CLINICAL DATA:  Central line placement. EXAM: PORTABLE CHEST  1 VIEW COMPARISON:  Chest x-ray from same day at 0550 hours. FINDINGS: New central venous catheter in the left neck with the tip overlying the peripheral mid left chest wall. Unchanged right internal jugular sheath and right subclavian central venous catheter. Unchanged feeding tube. Unchanged left chest wall AICD and LVAD. Stable cardiomegaly. Unchanged diffuse interstitial thickening with areas of hazy airspace disease. No pneumothorax or large pleural effusion. No acute osseous abnormality. IMPRESSION: 1. New central venous catheter in the left neck with the tip overlying the peripheral mid left chest wall. 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 No pneumothorax. 2. Other support apparatuses are stable. 3. Unchanged pulmonary edema. These results will be called to the ordering clinician or representative by the Radiologist Assistant, and communication documented in the PACS or [employer]. Electronically Signed   By: Titus Dubin M.D.   On: 03/31/2020 16:54   [place] EKG SITE RITE  Result Date: 03/31/2020 If Site Rite image not attached, placement could not be confirmed due to current cardiac rhythm.    Medications:     Scheduled Medications: . sodium chloride   Intravenous Once  . amiodarone  200 mg Per Tube BID  . aspirin  81 mg Per Tube Daily  . atorvastatin  80 mg Per Tube q1800  . B-complex with vitamin C  1 tablet Per Tube Daily  . bisacodyl  10 mg Oral Daily   Or  . bisacodyl  10 mg Rectal Daily  . chlorhexidine  15 mL Mouth Rinse BID  . chlorhexidine gluconate (MEDLINE KIT)  15 mL Mouth Rinse BID  . Chlorhexidine Gluconate Cloth   6 each Topical Daily  . docusate  200 mg Per Tube Daily  . feeding supplement (PRO-STAT SUGAR FREE 64)  30 mL Per Tube Daily  . influenza vaccine adjuvanted  0.5 mL Intramuscular Tomorrow-1000  . insulin aspart  0-24 Units Subcutaneous Q4H  . insulin aspart  3 Units Subcutaneous Q4H  . insulin glargine  24 Units Subcutaneous BID  . mouth rinse  15 mL Mouth Rinse Q2H  . mouth rinse  15 mL Mouth Rinse 10 times per day  . melatonin  3 mg Per Tube QHS  . metoCLOPramide (REGLAN) injection  5 mg Intravenous Q8H  . pantoprazole (PROTONIX) IV  40 mg Intravenous Daily  . polyethylene glycol  17 g Per Tube Daily  . QUEtiapine  25 mg Per Tube QHS  . sildenafil  20 mg Per Tube TID  . sodium chloride flush  10-40 mL Intracatheter Q12H  . Warfarin - Pharmacist Dosing Inpatient   Does not apply q1600    Infusions: .  prismasol BGK 4/2.5 500 mL/hr at 04/02/20 0701  . sodium chloride Stopped (03/22/20 2011)  . sodium chloride Stopped (03/27/20 1523)  . sodium chloride    . sodium chloride 10 mL/hr at 03/14/2020 2000  . sodium chloride    . cefUROXime (ZINACEF)  IV    . dexmedetomidine Stopped (04/02/20 0617)  . epinephrine 8 mcg/min (04/02/20 0700)  . feeding supplement (VITAL 1.5 CAL) 1,000 mL (03/31/20 0954)  . fentaNYL infusion INTRAVENOUS 50 mcg/hr (04/02/20 0700)  . lactated ringers Stopped (03/29/20 0748)  . milrinone 0.25 mcg/kg/min (04/02/20 0700)  . norepinephrine (LEVOPHED) Adult infusion 10 mcg/min (04/02/20 0823)  . prismasol BGK 0/2.5 300 mL/hr at 04/03/2020  2100  . prismasol BGK 4/2.5 1 mL (04/02/20 0740)  . vasopressin (PITRESSIN) infusion - *FOR SHOCK* 0.05 Units/min (04/02/20 0700)    PRN Medications: sodium chloride, acetaminophen, dextrose, fentaNYL (SUBLIMAZE) injection, fentaNYL (SUBLIMAZE) injection, heparin, heparin, hydrALAZINE, HYDROcodone-acetaminophen, levalbuterol, ondansetron (ZOFRAN) IV, ondansetron (ZOFRAN) IV, oxyCODONE, sodium chloride, sodium chloride flush,  sorbitol, traMADol   Assessment/Plan:   1. Acute on chronic systolic HF -> cardiogenic shock-> S/p HM3 LVAD - Echo 2016 EF 30-35% - Echo 1/21 EF 25% - Admitted with NYHA IV symptoms and AKI with attempts at diuresis.  - Echo this admit: EF 10-15% moderate RV dysfunction - R/LHC cath 3/21 with severe 3v CAD and low output with CI 1.7.  - Swan placed. Initial co-ox 39%. Initial PAPi 1.5. Started on milrinone 0.25. - Initially planned for CABG but given need for re-do sternotomy, relatively poor targets and longstanding low EF, VAD felt to be better option  - S/p HM3 VAD 4/8 - Speed turned up on 4/11 to  5400, but looking at 4/10 echo there is significant RV dysfunction with leftward septal shift so speed decreased back to 5300 rpm.  - Developed hemothorax on 4/20 requiring emergent chest tube and VATS - CXR much improved. But hemodynamically and neurologically tenuous requiring high-dose pressors in setting of RV failure and hemothorax - Wean pressors as tolerated - RN concern over posturing. Will get EEG today. Consider repeat head CT if not improving. Minimize sedation - Start broad spectrum abx  2. VAD - VAD interrogated personally. Parameters stable. - LDH stable at 349 - INR  2.3 Personally reviewed - Driveline ok    3. CAD with unstable angina - s/p previous PCI - cath 03/08/2020 with severe 3v CAD - now s/p VAD on 03/24/2020    4. AKI on CKD 3a - Nephrology consulted. Started CVVHD 4/13  - Renal US 4/5 unremarkable.  - CVP ~10. Weight up 15 pounds  Run CVVHD -50 - Hopefully renal function will recover though likely set back by recent events  5. Permanent AF - now s/p MAZE + LAA Clipping 4/8   - Resume IV amio while on high-dose pressors - Warfarin as above.  6. MVP s/p MV repair - On admit with recurrent severe MR on echo and with huge v-waves on PCWP tracing - s/p VAD  7. DM2, poorly controlled - hgbA1c 11.1% - continue insulin and SSI   8. ID/leukocytosis -  4/11 procalcitonin 1.96  - Started empiric coverage for PNA with vancomycin/cefepime for 7 days. Stopped 4/17 - Given recent VATs and high-dose pressor requirement will cover with broad spectrum abx for now  9. Acute Hypoxic Respiratory Failure - Re-intubated 4/20 with hemothorax - not ready for wean yet  10. Malnutrition  - Albumin 2.9   - On tube feeds. Will see if we can get    11. CVA/anoxic brain injury - 03/27/20 CT large posterior MCA ischemic infarct, possibly from atrial fibrillation.  INR was supratherapeutic when CVA found.  Stable LDH, do not think partial pump thrombosis is the culprit. - had hypotension with events on 4/20.  RN concern over posturing. Will get EEG today. Consider repeat head CT if not improving. Minimize sedation  CRITICAL CARE Performed by: Glori Bickers  Total critical care time: 45 minutes  Critical care time was exclusive of separately billable procedures and treating other patients.  Critical care was necessary to treat or prevent imminent or life-threatening deterioration.  Critical care was time spent personally by me (independent of  midlevel providers or residents) on the following activities: development of treatment plan with patient and/or surrogate as well as nursing, discussions with consultants, evaluation of patient's response to treatment, examination of patient, obtaining history from patient or surrogate, ordering and performing treatments and interventions, ordering and review of laboratory studies, ordering and review of radiographic studies, pulse oximetry and re-evaluation of patient's condition.      Length of Stay: 21   Glori Bickers MD  04/02/2020, 9:25 AM  Advanced Heart Failure Team Pager [phone] (M-F; [place])  Please contact [facility] for night-coverage after hours (4p -7a ) and weekends on [email or web address]

## 2020-04-02 NOTE — Progress Notes (Signed)
PULMONARY / CRITICAL CARE MEDICINE   NAME:  [name] [name], MRN:  [id number], DOB:  [birth year], LOS: 29 ADMISSION DATE:  03/01/2020, CONSULTATION DATE:  03/27/20 REFERRING MD:  Aundra Dubin, CHIEF COMPLAINT:  Respiratory failure  BRIEF HISTORY:    69 year old man with advanced ischemic cardiomyopathy, prior mitral valve repair, afib presented for redo sternotomy, LVAD implantation, left atrial appendage clipping, and sternal reconstruction on 03/30/2020.  Hospitalization complicated by renal failure, RV dysfunction, persistent delirium, and R MCA stroke.  Respiratory status has been tenuous.  On the morning of 4/20, patient became agonal with low flow on LVAD followed by intubation and two rounds of epi.  PCCM re-consulted.  HISTORY OF PRESENT ILLNESS   69 year old man with advanced ischemic cardiomyopathy, prior mitral valve repair, afib presented for redo sternotomy, LVAD implantation, left atrial appendage clipping, and sternal reconstruction on 04/07/2020.    Postoperative course complicated by persistent delirium, RV dysfunction, renal failure, abnormal INR of unclear etiology.  Started on CRRT 4/13.  Patient less responsive.  Found on CT head 4/15 with acute posterior R MCA stroke.  Neurology following.  Respiratory status has been tenuous post op as well in which PCCM initially consulted for.  Has remained mostly on BIPAP.  On 4/16, on 5 L [place] and saturating well. He is alert and oriented to self today (4/16).  PCCM available on as needed basis.  On the morning of 4/20, patient became agonal with low flow on LVAD followed by intubation and two rounds of epi.  PCCM re-consulted.  SIGNIFICANT PAST MEDICAL HISTORY   Ischemic cardiomyopathy MV prolapse s/p repair in 2004 HTN HLD CKD  SIGNIFICANT EVENTS:  4/8: redo sternotomy, LVAD implantation, left atrial appendage clipping, and sternal reconstruction  4/9: extubated 4/13 CRRT started  4/15 R MCA CVA on [facility] 4/16 off Bipap, on 5L [place] 4/19 CRRT  stopped, respiratory arrest, intubated, chest tube placed for L hemothorax  4/20 L VATS with 2 pleural drains placed   STUDIES:   CT Head 03/27/20 IMPRESSION: Large posterior right MCA infarct, new since prior study.  CT a/p 03/28/2020 IMPRESSION: 1. No acute intra-abdominal or pelvic pathology. No bowel obstruction. Normal appendix. 2. Cirrhosis with small perihepatic ascites. 3. Cholelithiasis. No evidence of acute cholecystitis by CT. 4. Aortic Atherosclerosis (ICD10-I70.0).  4/15 CXR volume overload  Echo 4/10 1. Images are poor quality and cannot comment on global LV function. .  Left ventricular ejection fraction, by estimation, is 20 to 25%. The left  ventricle has severely decreased function. The left ventricle demonstrates  global hypokinesis. There is  moderate concentric left ventricular hypertrophy.  2. Aortic dilatation noted. There is mild dilatation of the ascending  aorta and of the aortic root measuring 41 mm and 72mm respectively.  3. Right ventricular systolic function is severely reduced. The right  ventricular size is moderately enlarged.  4. The aortic valve is tricuspid. Mild aortic valve sclerosis is present,  with no evidence of aortic valve stenosis.   CULTURES:  COVID swab neg on admission MRSA PCR neg 4/7 Blood cultures x 2 4/13 neg Urine culture 4/12 neg  Bcx 4/20>  Respiratory cx 4/20>   ANTIBIOTICS:  Vanc 4/12 - 4/17 Cefepime 4/11 - 4/17  LINES/TUBES:  4/8: redo sternotomy, LVAD implantation, left atrial appendage clipping, and sternal reconstruction   RIJ introducer >> R [place] HD catheter >> R radial A line >> out R/L pleural drains  >> out  4/20 ETT >> 4/20 R radial aline >>  4/20 left chest tube >>  CONSULTANTS:  CHF, TCTS, PCCM, Nephro, Neuro, Hematology  SUBJECTIVE:  Taken for VATS yesterday afternoon due to remaining clot in L lung and no drainage from CT. No complications from procedure. Two pleural drains left in place.    CONSTITUTIONAL: BP 105/90   Pulse (!) 101   Temp 97.6 F (36.4 C) (Oral)   Resp 20   Ht 6' (1.829 m)   Wt 107.5 kg   SpO2 98%   BMI 32.14 kg/m   I/O last 3 completed shifts: In: 4987.4 [P.O.:540; I.V.:1185.3; Blood:1332; NG/GT:1120; IV Piggyback:810] Out: 6761 [Emesis/NG output:1000; PJKDT:2671; Blood:700; Chest Tube:1250]  CVP:  [8 mmHg-16 mmHg] 11 mmHg  Vent Mode: PRVC FiO2 (%):  [50 %-100 %] 50 % Set Rate:  [22 bmp] 22 bmp Vt Set:  [245 mL] 620 mL PEEP:  [5 cmH20] 5 cmH20 Plateau Pressure:  [21 YKD98-33 cmH20] 21 cmH20  PHYSICAL EXAM: General: elderly male, critically-ill  HEENT: ETT in place  Neuro: decerebrate posturing, minimal pupillary response, does not follow commands or responds to verbal stimulus  CV:  LVAD hum  PULM: ventilated breath sounds, wheezing on the L, L CT and 2 pleural drains with bloody drainage  GI: soft, NTND Extremities: mild pedal edema bilaterally  Skin: warm   RESOLVED PROBLEM LIST    ASSESSMENT AND PLAN    # Acute hypoxic respiratory failure secondary to L hemothorax s/p L CT and VATS 4/20 -- Continue full MV support, PRVC 8cc/kg  -- PAD protocol with fentanyl and precedex gtt and PRN fentanyl pushes if needed for RASS goal 0-/1 with bowel regimen -- VAP precautions in place   -- L Chest tube and 2 pleural drain in place draining bloody fluid  -- CXR this AM showed central vascular congestion and pulmonary edema -- Continue CVHD for fluid removal  -- Follow up repeat respiratory and Bcx, collected 4/20  # Cardiogenic shock   # Suspected PEA arrest  # ICM s/p LVAD placement 4/8 for destination therapy  -- No obvious tamponade physiology on bedside TTE by Dr. Haroldine Laws  -- On epi, norepi, and vaso gtt. Recommend weaning epi off, can uptitrate norepi to help with this. Could also dose vaso at 0.03. Will defer final management to TCTS  -- Co-ox 73 this AM  -- Trend H/H -- Management per TCT/CHF team -- On ASA, holding Coumadin   -- INR goal 2-3    # Right MCA cardioembolic stroke, suspected secondary to Afib  # Seizure like-activity  -- Neuro signed off 4/17. Recommended continuing AC with ASA and warfarin  -- Patient noted to have rhythmic movements of bilateral UE this AM -- EEG has been ordered   # Chronic afib -- S/p maze and LAA clipping 4/8 -- Holding coumadin due to L hemothorax -- On amiodarone 200 mg BID   # Acute renal failure on CKD  -- Nephrology following  -- On CRRT 4/19   -- Remains anuric -- Trend renal panel/ mag daily   # DM type II uncontrolled -- SSI, lantus   # GOC:  -- Palliative care has been consulted for [place] discussion given decline in status    Best Practice / Goals of Care / Disposition.   Diet: TF on hold Pain/Anxiety/Delirium protocol (if indicated): fentanyl and precedex gtt VAP protocol (if indicated): yes DVT prophylaxis: holding warfarin, SCD  GI prophylaxis: PPI Glucose control: SSI Mobility: BR Code Status: partial- no compression given LVAD Family Communication: per primary Disposition:  ICU  LABS  Glucose Recent Labs  Lab 03/27/2020 0834 04/04/2020 1248 04/09/2020 1813 03/23/2020 2031 03/14/2020 2349 04/02/20 0417  GLUCAP 308* 260* 170* 178* 194* 196*    BMET Recent Labs  Lab 04/11/2020 0512 03/15/2020 0600 03/27/2020 1616 03/16/2020 1726 04/02/20 0429  NA 132*   < > 135 136 138  K 5.9*   < > 6.7* 6.6* 5.5*  CL 96*   < > 99 101 103  CO2 21*  --   --  24 24  BUN 42*   < > 47* 52* 45*  CREATININE 3.29*   < > 3.90* 4.30* 3.61*  GLUCOSE 301*   < > 260* 214* 212*   < > = values in this interval not displayed.    Liver Enzymes Recent Labs  Lab 03/29/20 1050 03/29/20 1720 03/30/20 0324 03/30/20 0730 03/31/20 0339 03/31/20 1649 03/30/2020 0408 03/31/2020 1726 04/02/20 0429  AST 31  --  27  --  30  --   --   --   --   ALT 25  --  24  --  21  --   --   --   --   ALKPHOS 124  --  119  --  116  --   --   --   --   BILITOT 1.3*  --  1.0  --  0.9  --   --    --   --   ALBUMIN 2.5*   < > 2.3*   < > 2.2*  2.2*   < > 2.3* 2.8* 2.6*   < > = values in this interval not displayed.    Electrolytes Recent Labs  Lab 03/31/2020 0408 04/07/2020 0410 04/07/2020 0512 04/10/2020 1726 04/02/20 0429  CALCIUM   < >  --  8.6* 7.9* 7.9*  MG  --  2.4 2.9*  --  2.5*  PHOS   < >  --  5.7* 6.6* 5.2*   < > = values in this interval not displayed.    CBC Recent Labs  Lab 03/28/2020 1042 04/07/2020 1405 03/23/2020 1616 03/13/2020 1726 04/02/20 0429  WBC 45.4*  --   --  34.6* 26.9*  HGB 9.4*   < > 10.2* 9.4* 8.8*  HCT 29.0*   < > 30.0* 29.0* 27.1*  PLT 170  --   --  131* 129*   < > = values in this interval not displayed.    ABG Recent Labs  Lab 04/07/2020 1330 03/26/2020 1405 04/09/2020 1743  PHART 7.460* 7.399 7.349*  PCO2ART 31.3* 36.9 47.3  PO2ART 170* 235.0* 203*    Coag's Recent Labs  Lab 03/31/20 0339 03/21/2020 0410 04/02/20 0429  INR 2.0* 2.2* 2.3*    Sepsis Markers No results for input(s): LATICACIDVEN, PROCALCITON, O2SATVEN in the last 168 hours.  Cardiac Enzymes No results for input(s): TROPONINI, PROBNP in the last 168 hours.    Please refer to attending attestation for further details.

## 2020-04-02 NOTE — Progress Notes (Signed)
1 Day Post-Op Procedure(s) (LRB): VIDEO ASSISTED THORACOSCOPY (Left) TRANSESOPHAGEAL ECHOCARDIOGRAM (TEE) (N/A) Evacuation Hematoma (Left) Subjective: Hemodynamically stable Sedated on ventilator with adequate ABG Chest x-ray shows better aeration of the left lung after evacuation of hematoma Patient's neuro status being monitored-EEG today and possible head CT tomorrow Minimal output from chest tubes, INR therapeutic 2.3  Objective: Vital signs in last 24 hours: Temp:  [97.4 F (36.3 C)-98.8 F (37.1 C)] 97.7 F (36.5 C) (04/21 1200) Pulse Rate:  [72-230] 217 (04/21 1715) Cardiac Rhythm: Atrial fibrillation (04/21 0800) Resp:  [14-24] 20 (04/21 1715) BP: (73-129)/(43-114) 92/79 (04/21 1715) SpO2:  [91 %-100 %] 96 % (04/21 1715) Arterial Line BP: (74-114)/(59-85) 82/62 (04/21 1715) FiO2 (%):  [40 %-70 %] 40 % (04/21 1557) Weight:  [107.5 kg-108.3 kg] 107.5 kg (04/21 0500)  Hemodynamic parameters for last 24 hours: CVP:  [4 mmHg-16 mmHg] 7 mmHg  Intake/Output from previous day: 04/20 0701 - 04/21 0700 In: 4649.9 [I.V.:2467.9; Blood:1332; NG/GT:300; IV Piggyback:550.1] Out: 7035 [Emesis/NG output:1000; Blood:700; Chest Tube:1400] Intake/Output this shift: Total I/O In: 1342.5 [I.V.:1190.9; NG/GT:51.7; IV Piggyback:100] Out: 0093 [GHWEX:9371; Chest Tube:30]  Exam Responds to stimulus, no purposeful response Coarse breath sounds Chest tubes with minimal output, no air leak Normal VAD hum Abdomen nondistended  Lab Results: Recent Labs    03/22/2020 1726 04/02/20 0429  WBC 34.6* 26.9*  HGB 9.4* 8.8*  HCT 29.0* 27.1*  PLT 131* 129*   BMET:  Recent Labs    04/02/20 0429 04/02/20 1620  NA 138 137  K 5.5* 5.5*  CL 103 101  CO2 24 24  GLUCOSE 212* 207*  BUN 45* 38*  CREATININE 3.61* 2.86*  CALCIUM 7.9* 7.9*    PT/INR:  Recent Labs    04/02/20 0429  LABPROT 25.4*  INR 2.3*   ABG    Component Value Date/Time   PHART 7.349 (L) 03/24/2020 1743   HCO3  25.4 03/22/2020 1743   TCO2 30 04/11/2020 1616   ACIDBASEDEF 2.0 04/10/2020 1405   O2SAT 73.9 04/02/2020 0420   CBG (last 3)  Recent Labs    04/02/20 0417 04/02/20 0844 04/02/20 1152  GLUCAP 196* 185* 170*    Assessment/Plan: S/P Procedure(s) (LRB): VIDEO ASSISTED THORACOSCOPY (Left) TRANSESOPHAGEAL ECHOCARDIOGRAM (TEE) (N/A) Evacuation Hematoma (Left) Leave chest tubes to suction Okay to resume Coumadin at low-dose since patient is sensitive due to hepatic disease Broad-spectrum antibiotics with rising white count which could also be stress-induced Chest x-ray in a.m.   LOS: 21 days    [name] [name] 04/02/2020

## 2020-04-02 NOTE — Progress Notes (Signed)
PT Cancellation Note  Patient Details Name: [name] [name] MRN: [id number] DOB: 29-Sep-1951   Cancelled Treatment:    Reason Eval/Treat Not Completed: Patient not medically ready. Pt now intubated, on CRRT, and has INR of 2.3. Per RN pt now posturing as well. Pt not appropriate for PT at this time. Acute PT to return as able, as appropriate.  [name], PT, DPT Acute Rehabilitation Services Pager #: [phone] Office #: [phone]    [name] 04/02/2020, 10:17 AM

## 2020-04-02 NOTE — Progress Notes (Signed)
EEG complete - results pending 

## 2020-04-02 NOTE — Op Note (Signed)
NAME: [name] [name], [name] [name]. MEDICAL RECORD EX:[id number] ACCOUNT [id number] DATE OF BIRTH:1951/05/13 FACILITY: [facility] LOCATION: [facility] PHYSICIAN:Kamil Hanigan VAN TRIGT III, MD  OPERATIVE REPORT  DATE OF PROCEDURE:  03/19/2020  OPERATIONS:   1.  Left video-assisted thoracoscopic surgery. 2.  Evacuation of left hemothorax.  SURGEON:  Ivin Poot, MD  ASSISTANT:  Jadene Pierini, PA-C.  ANESTHESIA:  General by Dr. Harvest Dark.  PREOPERATIVE DIAGNOSIS:  Left hemothorax, persistent after placement of large bore chest tube with partial drainage.  POSTOPERATIVE DIAGNOSIS:  Left hemothorax, persistent after placement of large bore chest tube with partial drainage.  CLINICAL NOTE:  The patient is a 69 year old male who underwent implantation of HeartMate 3 left ventricular assist device approximately 2 weeks earlier for ischemic cardiomyopathy and class IV heart failure.  His ejection fraction was 15%.   Postoperatively, he developed acute renal insufficiency and required dialysis.  He also developed left-sided weakness from a right posterior circulation large CVA.  His speech was also affected.  The patient was found to have a large hemothorax early  a.m. on 04/20 and had a large bore chest tube placed, which drained approximately 750 mL of blood.  His hemoglobin had dropped by 1 g and he had received blood transfusion as well.  He showed improvement after the chest tube placement, but however, his  followup chest x-ray still showed a persistent hemothorax with probable compression of the left lung.  For that reason, left VATS was recommended to allow optimization of his pulmonary function.  Informed consent was received from the patient's daughter.  DESCRIPTION OF PROCEDURE:  The patient was brought directly from the ICU to the operating room on the ventilator.  He remained stable.  The VAD coordinator was present for the entire procedure, as well as for the transportation to monitor the patient's   VAD equipment and to assist with management of the patient's hemodynamics during the procedure.  The patient was placed supine on the operating table and general anesthesia was induced.  A double lumen endotracheal tube was placed by the anesthesia team.  The patient was then partially rolled, left side up with a soft blanket.  The previously placed  left chest tube was removed and the left chest was prepped and draped as a sterile field.  A proper time-out was performed.  A small incision was made anteriorly in the 4th interspace.  The chest wall muscles were retracted and an incision in between the 4th and 5th ribs was completed.  A small retractor was placed.  There was clot compressing the lung.  The clot was well  formed, currant jelly in character.  The clot was removed mechanically under direct vision, as well as with the VATS camera.  There was no active bleeding.  There was no evidence of where the site of bleeding was.  The chest was irrigated with sterile  saline.  Two pleural drains were placed and brought out through separate incisions.  The lung was then reexpanded under direct vision after a pericostal suture was placed to reapproximate the ribs.  The chest wall muscle was closed with interrupted #1 Vicryl for the pectoralis muscle and running 2-0 Vicryl for the subcutaneous layer.  The skin was closed with a subcuticular.  Sterile dressing was applied.  Chest tubes were connected to an underwater  seal Pleur-Evac drainage system and the patient was rolled supine.  At that point, the endotracheal tube was switched back to a single lumen.  A new IJ Cordis sheath was  also inserted by the anesthesia team and the previously placed Cordis sheath had  been removed.  The patient returned back to the ICU in stable condition.  VN/NUANCE  D:04/02/2020 T:04/02/2020 JOB:[id number]

## 2020-04-02 NOTE — Procedures (Signed)
Cortrak  Person Inserting Tube:  Jacklynn Barnacle E, RD Tube Type:  Cortrak - 43 inches Tube Location:  Left nare Initial Placement:  Postpyloric Secured by: Bridle Technique Used to Measure Tube Placement:  Documented cm marking at nare/ corner of mouth Cortrak Secured At:  94 cm    Cortrak Tube Team Note:  Consult received to advance existing Cortrak feeding tube to be post-pyloric.   Advanced existing Cortrak tube from 65 cm to 94 cm.  X-ray is required, abdominal x-ray has been ordered by the Cortrak team. Please confirm tube placement before using the Cortrak tube.   According to abdominal x-ray completed after tube placement the Cortrak tube terminates in proximal duodenum.  If the tube becomes dislodged please keep the tube and contact the Cortrak team at [email or web address] ([email or web address]) for replacement.  If after hours and replacement cannot be delayed, place a NG tube and confirm placement with an abdominal x-ray.   Jacklynn Barnacle, MS, RD, LDN Pager number available on Amion

## 2020-04-02 NOTE — Procedures (Signed)
Admit: 03/11/2020 LOS: 21  59M dialysis dependent AKI on CRRT, acute CHF exacerbation s/p VAD, status post MV repair, AFib s/p MAZE and LAA clipping 4/8; R MCA CVA 4/15  Current CRRT Prescription: Start Date: 4/13 Catheter: L [place] Temp HD cath BFR: 200 Pre Blood Pump: 500 4K DFR: 1500 4K Replacement Rate: 300 0K Goal UF: net even Anticoagulation: systemic anticoagulation / warfarin Clotting: acceptable   S:  VATS yesterday for hemothorax  Back on CRRT, 0K post bath  Epi, NE, VP, milrinone  No UOP  K 5.5, P 5.2 this AM   O: 04/20 0701 - 04/21 0700 In: 4649.9 [I.V.:2467.9; Blood:1332; NG/GT:300; IV Piggyback:550.1] Out: 6568 [Emesis/NG output:1000; Blood:700; Chest Tube:1400]  Filed Weights   03/31/20 0354 03/21/2020 2100 04/02/20 0500  Weight: 101.1 kg 108.3 kg 107.5 kg    Recent Labs  Lab 03/28/2020 0512 03/23/2020 0600 04/06/2020 1616 03/23/2020 1726 04/02/20 0429  NA 132*   < > 135 136 138  K 5.9*   < > 6.7* 6.6* 5.5*  CL 96*   < > 99 101 103  CO2 21*  --   --  24 24  GLUCOSE 301*   < > 260* 214* 212*  BUN 42*   < > 47* 52* 45*  CREATININE 3.29*   < > 3.90* 4.30* 3.61*  CALCIUM 8.6*  --   --  7.9* 7.9*  PHOS 5.7*  --   --  6.6* 5.2*   < > = values in this interval not displayed.   Recent Labs  Lab 03/31/20 0339 03/31/20 0425 04/11/2020 0410 03/19/2020 0512 03/16/2020 1042 04/04/2020 1405 03/25/2020 1616 03/25/2020 1726 04/02/20 0429  WBC 13.3*   < > 19.5*   < > 45.4*  --   --  34.6* 26.9*  NEUTROABS 11.4*  --  16.9*  --   --   --   --   --  24.0*  HGB 9.3*   < > 8.5*   < > 9.4*   < > 10.2* 9.4* 8.8*  HCT 28.7*   < > 27.0*   < > 29.0*   < > 30.0* 29.0* 27.1*  MCV 92.9   < > 92.5   < > 91.2  --   --  88.1 87.7  PLT 134*   < > 153   < > 170  --   --  131* 129*   < > = values in this interval not displayed.    Scheduled Meds: . sodium chloride   Intravenous Once  . amiodarone  200 mg Per Tube BID  . aspirin  81 mg Per Tube Daily  . atorvastatin  80 mg Per Tube  q1800  . B-complex with vitamin C  1 tablet Per Tube Daily  . bisacodyl  10 mg Oral Daily   Or  . bisacodyl  10 mg Rectal Daily  . chlorhexidine  15 mL Mouth Rinse BID  . chlorhexidine gluconate (MEDLINE KIT)  15 mL Mouth Rinse BID  . Chlorhexidine Gluconate Cloth  6 each Topical Daily  . docusate  200 mg Per Tube Daily  . feeding supplement (PRO-STAT SUGAR FREE 64)  30 mL Per Tube Daily  . influenza vaccine adjuvanted  0.5 mL Intramuscular Tomorrow-1000  . insulin aspart  0-24 Units Subcutaneous Q4H  . insulin aspart  3 Units Subcutaneous Q4H  . insulin glargine  24 Units Subcutaneous BID  . mouth rinse  15 mL Mouth Rinse Q2H  . mouth rinse  15  mL Mouth Rinse 10 times per day  . melatonin  3 mg Per Tube QHS  . metoCLOPramide (REGLAN) injection  5 mg Intravenous Q8H  . pantoprazole (PROTONIX) IV  40 mg Intravenous Daily  . polyethylene glycol  17 g Per Tube Daily  . QUEtiapine  25 mg Per Tube QHS  . sildenafil  20 mg Per Tube TID  . sodium chloride flush  10-40 mL Intracatheter Q12H  . Warfarin - Pharmacist Dosing Inpatient   Does not apply q1600   Continuous Infusions: .  prismasol BGK 4/2.5 500 mL/hr at 04/02/20 0701  . sodium chloride Stopped (03/22/20 2011)  . sodium chloride Stopped (03/27/20 1523)  . sodium chloride    . sodium chloride 10 mL/hr at 03/26/2020 2000  . sodium chloride    . cefUROXime (ZINACEF)  IV    . dexmedetomidine Stopped (04/02/20 0617)  . epinephrine 8 mcg/min (04/02/20 0700)  . feeding supplement (VITAL 1.5 CAL) 1,000 mL (03/31/20 0954)  . fentaNYL infusion INTRAVENOUS 50 mcg/hr (04/02/20 0700)  . lactated ringers Stopped (03/29/20 0748)  . milrinone 0.25 mcg/kg/min (04/02/20 0700)  . norepinephrine (LEVOPHED) Adult infusion 10 mcg/min (04/02/20 0700)  . prismasol BGK 0/2.5 300 mL/hr at 03/21/2020 2100  . prismasol BGK 4/2.5 1 mL (04/02/20 0740)  . vasopressin (PITRESSIN) infusion - *FOR SHOCK* 0.05 Units/min (04/02/20 0700)   PRN Meds:.sodium  chloride, acetaminophen, dextrose, fentaNYL (SUBLIMAZE) injection, fentaNYL (SUBLIMAZE) injection, heparin, heparin, hydrALAZINE, HYDROcodone-acetaminophen, levalbuterol, ondansetron (ZOFRAN) IV, ondansetron (ZOFRAN) IV, oxyCODONE, sodium chloride, sodium chloride flush, sorbitol, traMADol  ABG    Component Value Date/Time   PHART 7.349 (L) 03/16/2020 1743   PCO2ART 47.3 03/24/2020 1743   PO2ART 203 (H) 03/14/2020 1743   HCO3 25.4 03/19/2020 1743   TCO2 30 03/27/2020 1616   ACIDBASEDEF 2.0 03/22/2020 1405   O2SAT 73.9 04/02/2020 0420    A/P  1. Anuric Dialysis dependent AKI on CRRT; 4K pre/D and 0K post, no circuit anticoagluation; No UF currently 2. Hypophosphatemia, replete prn for P < 2.5 3. sCHF s/p VAD 4/8 4. Hemothorax s/p VATS 03/19/2020 5. AFib s/p MAZE and LAA clipping 4/8 6. DM2 7. R MCA ischemic CVA 8. Coded overnight, L hemothorax s/p CT  9. SHock on NE and VP, on milrinone  Cont CRRT, keep post bath to 0K. K should iimproved over course of today.  No heparin in circuit.  Net even  Pearson Grippe, MD [employer]

## 2020-04-02 NOTE — Progress Notes (Signed)
LVAD Coordinator Rounding Note:  HM III LVAD implanted on 03/21/2020 with MAZE procedure + LAA clipping  by Dr Orvan Seen under destination therapy criteria due to advanced age. Primary Heart Failure Cardiologist is Dr. Haroldine Laws.   Pt lying in bed, remains vented, eyes closed. Does not respond follow commands; possible decorticate posturing left upper extremity to pain? BS nurse reports plan for EEG today.   CVVH restarted last night, running even.   Vital signs: Temp: 97.4 HR: 112 Doppler Pressure:  A line: 101/74 (84) O2 Sat: 50% FIO2, 5 peep Wt: 253.3>262.3>260.8>259.9>237.8>220>223>222.8>236.9 lbs   LVAD interrogation reveals:  Speed: 5300 Flow: 4.1 Power: 3.8w  PI: 4.4  Alarms: none since yesterday am Events:  >90 on 04/04/2020 Hematocrit: 27  Fixed speed: 5300 Low speed limit: 5000  CRRT: on hold this am; plan to re-start per Neprhology   Drive Line: CDI. Every other day dressing changes per nurse champion or VAD coordinator.  Next dressing change due 04/03/20. VAD dressing C/D/I; anchor intact and accurately applied.   Labs:  LDH trend: 360>513>482>467>453>454>474>428>415>422>349  INR trend: 1.3>6.2>9>4.4>6.2>2.2>2.4>2.2>2.0>2.2>2.3  CR: 3.57>3.98>3.18>2.44>2.41>2.0>2.1>1.96>3.29>4.3>3.6  Anticoagulation Plan: -INR Goal: 2.0-2.5 -ASA Dose: 81 mg   Respiratory: - extubated 4/9/2 - re-intubated 03/26/2020 s/p left hemothorax with cardiac arrest  Nitric Oxide: off 03/23/20  Blood Products:  Intraop: 6 FFP  DDAVP  420 cell saver  03/29/2020>>4 FFP 03/21/20>> 1 RBC 03/22/20>>1 RBC  Post-op: 03/31/2020>>3 PC's, 2 FFP  Intra-op: 03/22/2020>>2 PCs      2 FFP  Device: -St [name] -Therapies: VF 194 - therapy on  Drips: - Milrinone 0.25 mcg/kg/min - Epi mcg/min  - Vaso 0.05 units/min - Levo 10 mcg/min - Fentanyl 50 mcg/hr  Adverse Events on VAD: >>03/25/20 CVVH Started >>03/26/2020 left hemothorax; cardiac arrest  Pt Education:  1. Education inappropriate at this time, pt  is unable to respond; remains intubated.   2. No family at bedside.  Plan/Recommendations:  1. Call VAD coordinator for any equipment or drive line issues.  2. Every other day drive line dressing change per nurse champion or VAD coordinator.   Zada Girt RN [facility] Coordinator  Office: [phone]  24/7 Pager: [phone]

## 2020-04-02 NOTE — Procedures (Signed)
Patient Name: [name] [name]  MRN: [id number]  Epilepsy Attending: Lora Havens  Referring Physician/Provider: Dr Glori Bickers Date: 04/02/2020 Duration: 24.48 mins  Patient history: 69 yo M with Large posterior right MCA infarct and was noted to have posturing. EEG to evaluate for seizure.   Level of alertness: comatose  AEDs during EEG study: None  Technical aspects: This EEG study was done with scalp electrodes positioned according to the 10-20 International system of electrode placement. Electrical activity was acquired at a sampling rate of 500Hz  and reviewed with a high frequency filter of 70Hz  and a low frequency filter of 1Hz . EEG data were recorded continuously and digitally stored.   DESCRIPTION:  EEG showed continuous generalized polymorphic 3-6hz  theta-delta slowing. Hyperventilation and photic stimulation were not performed.   ABNORMALITY - Continuous slow, generalized   IMPRESSION: This study is suggestive of moderate to severe diffuse encephalopathy, non specific to etiology. No seizures or epileptiform discharges were seen throughout the recording.  Lloyd Cullinan Barbra Sarks

## 2020-04-02 NOTE — Progress Notes (Addendum)
VAD Coordinator Procedure Note:   VAD Coordinator met patient in 2H12. Pt undergoing Video Assisted Thoracoscopy per Dr. Darcey Nora. Hemodynamics and VAD parameters monitored by myself and anesthesia throughout the procedure. Blood pressures were obtained right radial A-line.  St [name] rep to [facility] and turned ICD therapy off for surgery. Pacing remains on at DDD 50 bpm.   Time: Doppler A-line   BP Flow PI Power Speed  Pre-procedure:           13:35  73/58 (65) 4.5 3.2 3.8 5300   14:00  97/79 (85) 4.4 3.2 3.8   Secation Induction:          14:15  65/60 (62) 5.0 3.0 3.8    14:30  89/77 (81) 4.6 3.1 3.7    14:45  79/69 (73) 4.8 3.2 3.7    15:00  82/72 (77) 4.7 3.2 3.7    15:15  69/60 (64) 4.9 3.3 3.7    15:30  82/67 (73) 4.8 3.3 3.8    15:45  79/64 (69) 4.8 3.2 3.8    16:00  89/70 (76) 4.7 3.5 3.8    16:15  74/62 (67) 4.7 3.6 3.8    16:30  96/75 (80) 4.9 3.4 3.47    16:45  71/61 (64) 4.8 3.4 3.8    17:00  97/75(81) 4.5 3.1 3.7              Patient tolerated the procedure well. VAD Coordinator accompanied and remained with patient throughout surgery and helped transport back to ICU. Dr. Darcey Nora plans to call patient's daughter and update her about surgery.   DL care: Existing VAD dressing removed prior to surgery. After surgery completed, site care performed using sterile technique. Drive line exit site cleaned with Chlora prep applicators x 2, allowed to dry, and gauze dressing with silver strip re-applied. Exit site with suture intact, the velour is fully implanted at exit site. No redness, tenderness, drainage, foul odor or rash noted. Drive line anchor re-applied.    St [name] rep to 2H12 upon arrival to room; ICD therapy turned back on per Dr. Thayer Ohm order.   Patient Disposition: 2H10  Zada Girt RN, VAD Coordinator 24/7 VAD Pager: [phone]

## 2020-04-03 ENCOUNTER — Inpatient Hospital Stay (HOSPITAL_COMMUNITY): Payer: Medicare HMO

## 2020-04-03 DIAGNOSIS — Z95811 Presence of heart assist device: Secondary | ICD-10-CM | POA: Diagnosis not present

## 2020-04-03 DIAGNOSIS — R570 Cardiogenic shock: Secondary | ICD-10-CM | POA: Diagnosis not present

## 2020-04-03 DIAGNOSIS — Z515 Encounter for palliative care: Secondary | ICD-10-CM | POA: Diagnosis not present

## 2020-04-03 DIAGNOSIS — N179 Acute kidney failure, unspecified: Secondary | ICD-10-CM | POA: Diagnosis not present

## 2020-04-03 DIAGNOSIS — J9601 Acute respiratory failure with hypoxia: Secondary | ICD-10-CM | POA: Diagnosis not present

## 2020-04-03 LAB — TYPE AND SCREEN
ABO/RH(D): O POS
Antibody Screen: NEGATIVE
Unit division: 0
Unit division: 0
Unit division: 0
Unit division: 0
Unit division: 0
Unit division: 0
Unit division: 0
Unit division: 0
Unit division: 0
Unit division: 0
Unit division: 0
Unit division: 0

## 2020-04-03 LAB — RENAL FUNCTION PANEL
Albumin: 2.2 g/dL — ABNORMAL LOW (ref 3.5–5.0)
Albumin: 2.2 g/dL — ABNORMAL LOW (ref 3.5–5.0)
Anion gap: 9 (ref 5–15)
Anion gap: 9 (ref 5–15)
BUN: 37 mg/dL — ABNORMAL HIGH (ref 8–23)
BUN: 39 mg/dL — ABNORMAL HIGH (ref 8–23)
CO2: 25 mmol/L (ref 22–32)
CO2: 24 mmol/L (ref 22–32)
Calcium: 7.9 mg/dL — ABNORMAL LOW (ref 8.9–10.3)
Calcium: 7.8 mg/dL — ABNORMAL LOW (ref 8.9–10.3)
Chloride: 102 mmol/L (ref 98–111)
Chloride: 102 mmol/L (ref 98–111)
Creatinine, Ser: 2.53 mg/dL — ABNORMAL HIGH (ref 0.61–1.24)
Creatinine, Ser: 2.54 mg/dL — ABNORMAL HIGH (ref 0.61–1.24)
GFR calc Af Amer: 29 mL/min — ABNORMAL LOW (ref >60)
GFR calc Af Amer: 29 mL/min — ABNORMAL LOW (ref >60)
GFR calc non Af Amer: 25 mL/min — ABNORMAL LOW (ref >60)
GFR calc non Af Amer: 25 mL/min — ABNORMAL LOW (ref >60)
Glucose, Bld: 255 mg/dL — ABNORMAL HIGH (ref 70–99)
Glucose, Bld: 284 mg/dL — ABNORMAL HIGH (ref 70–99)
Phosphorus: 4 mg/dL (ref 2.5–4.6)
Phosphorus: 3.7 mg/dL (ref 2.5–4.6)
Potassium: 5.3 mmol/L — ABNORMAL HIGH (ref 3.5–5.1)
Potassium: 5.3 mmol/L — ABNORMAL HIGH (ref 3.5–5.1)
Sodium: 136 mmol/L (ref 135–145)
Sodium: 135 mmol/L (ref 135–145)

## 2020-04-03 LAB — CBC
HCT: 24.4 % — ABNORMAL LOW (ref 39.0–52.0)
Hemoglobin: 7.7 g/dL — ABNORMAL LOW (ref 13.0–17.0)
MCH: 29.1 pg (ref 26.0–34.0)
MCHC: 31.6 g/dL (ref 30.0–36.0)
MCV: 92.1 fL (ref 80.0–100.0)
Platelets: 108 10*3/uL — ABNORMAL LOW (ref 150–400)
RBC: 2.65 MIL/uL — ABNORMAL LOW (ref 4.22–5.81)
RDW: 17 % — ABNORMAL HIGH (ref 11.5–15.5)
WBC: 18.5 10*3/uL — ABNORMAL HIGH (ref 4.0–10.5)
nRBC: 0 % (ref 0.0–0.2)

## 2020-04-03 LAB — POCT I-STAT 7, (LYTES, BLD GAS, ICA,H+H)
Acid-Base Excess: 2 mmol/L (ref 0.0–2.0)
Bicarbonate: 27.5 mmol/L (ref 20.0–28.0)
Calcium, Ion: 1.11 mmol/L — ABNORMAL LOW (ref 1.15–1.40)
HCT: 28 % — ABNORMAL LOW (ref 39.0–52.0)
Hemoglobin: 9.5 g/dL — ABNORMAL LOW (ref 13.0–17.0)
O2 Saturation: 98 %
Patient temperature: 37.8
Potassium: 5.1 mmol/L (ref 3.5–5.1)
Sodium: 134 mmol/L — ABNORMAL LOW (ref 135–145)
TCO2: 29 mmol/L (ref 22–32)
pCO2 arterial: 46.6 mmHg (ref 32.0–48.0)
pH, Arterial: 7.382 (ref 7.350–7.450)
pO2, Arterial: 108 mmHg (ref 83.0–108.0)

## 2020-04-03 LAB — BPAM RBC
Blood Product Expiration Date: 202105182359
Blood Product Expiration Date: 202105202359
Blood Product Expiration Date: 202105202359
Blood Product Expiration Date: 202105212359
Blood Product Expiration Date: 202105222359
Blood Product Expiration Date: 202105222359
Blood Product Expiration Date: 202105222359
Blood Product Expiration Date: 202105222359
Blood Product Expiration Date: 202105242359
Blood Product Expiration Date: 202105242359
Blood Product Expiration Date: 202105242359
Blood Product Expiration Date: 202105242359
ISSUE DATE / TIME: 202104200528
ISSUE DATE / TIME: 202104200528
ISSUE DATE / TIME: 202104200614
ISSUE DATE / TIME: 202104201006
ISSUE DATE / TIME: 202104201349
ISSUE DATE / TIME: 202104201349
ISSUE DATE / TIME: 202104211007
ISSUE DATE / TIME: 202104211007
Unit Type and Rh: 5100
Unit Type and Rh: 5100
Unit Type and Rh: 5100
Unit Type and Rh: 5100
Unit Type and Rh: 5100
Unit Type and Rh: 5100
Unit Type and Rh: 5100
Unit Type and Rh: 5100
Unit Type and Rh: 5100
Unit Type and Rh: 5100
Unit Type and Rh: 5100
Unit Type and Rh: 5100

## 2020-04-03 LAB — COOXEMETRY PANEL
Carboxyhemoglobin: 1.9 % — ABNORMAL HIGH (ref 0.5–1.5)
Methemoglobin: 1.2 % (ref 0.0–1.5)
O2 Saturation: 74.6 %
Total hemoglobin: 8.1 g/dL — ABNORMAL LOW (ref 12.0–16.0)

## 2020-04-03 LAB — GLUCOSE, CAPILLARY
Glucose-Capillary: 221 mg/dL — ABNORMAL HIGH (ref 70–99)
Glucose-Capillary: 190 mg/dL — ABNORMAL HIGH (ref 70–99)
Glucose-Capillary: 209 mg/dL — ABNORMAL HIGH (ref 70–99)
Glucose-Capillary: 252 mg/dL — ABNORMAL HIGH (ref 70–99)
Glucose-Capillary: 263 mg/dL — ABNORMAL HIGH (ref 70–99)

## 2020-04-03 LAB — HEPATIC FUNCTION PANEL
ALT: 22 U/L (ref 0–44)
AST: 37 U/L (ref 15–41)
Albumin: 2.3 g/dL — ABNORMAL LOW (ref 3.5–5.0)
Alkaline Phosphatase: 119 U/L (ref 38–126)
Bilirubin, Direct: 0.5 mg/dL — ABNORMAL HIGH (ref 0.0–0.2)
Indirect Bilirubin: 0.3 mg/dL (ref 0.3–0.9)
Total Bilirubin: 0.8 mg/dL (ref 0.3–1.2)
Total Protein: 5.2 g/dL — ABNORMAL LOW (ref 6.5–8.1)

## 2020-04-03 LAB — BRAIN NATRIURETIC PEPTIDE: B Natriuretic Peptide: 332.7 pg/mL — ABNORMAL HIGH (ref 0.0–100.0)

## 2020-04-03 LAB — PROTIME-INR
INR: 3.4 — ABNORMAL HIGH (ref 0.8–1.2)
Prothrombin Time: 34.3 seconds — ABNORMAL HIGH (ref 11.4–15.2)

## 2020-04-03 LAB — LACTATE DEHYDROGENASE: LDH: 305 U/L — ABNORMAL HIGH (ref 98–192)

## 2020-04-03 LAB — MAGNESIUM: Magnesium: 2.7 mg/dL — ABNORMAL HIGH (ref 1.7–2.4)

## 2020-04-03 MED ORDER — INSULIN ASPART 100 UNIT/ML ~~LOC~~ SOLN
6.0000 [IU] | SUBCUTANEOUS | Status: DC
  Administered 2020-04-03 – 2020-05-02 (×119): 6 [IU] via SUBCUTANEOUS

## 2020-04-03 NOTE — Procedures (Signed)
Admit: 02/15/2020 LOS: 63  38M dialysis dependent AKI on CRRT, acute CHF exacerbation s/p VAD, status post MV repair, AFib s/p MAZE and LAA clipping 4/8; R MCA CVA 4/15  Current CRRT Prescription: Start Date: 4/13 Catheter: L [place] Temp HD cath BFR: 200 Pre Blood Pump: 500 4K DFR: 1500 4K Replacement Rate: 300 0K Goal UF: net even Anticoagulation: systemic anticoagulation / warfarin Clotting: acceptable   S:  No interval events  Epi, NE, VP, milrinone  No UOP  K 5.3, P 4.0 this AM   O: 04/21 0701 - 04/22 0700 In: 3576.4 [I.V.:2673.9; NG/GT:702.5; IV Piggyback:200.1] Out: 3994 [Chest Tube:120]  Filed Weights   03/29/2020 2100 04/02/20 0500 04/03/20 0500  Weight: 108.3 kg 107.5 kg 106.7 kg    Recent Labs  Lab 04/02/20 0429 04/02/20 0429 04/02/20 1620 04/03/20 0318 04/03/20 0329  NA 138   < > 137 134* 136  K 5.5*   < > 5.5* 5.1 5.3*  CL 103  --  101  --  102  CO2 24  --  24  --  25  GLUCOSE 212*  --  207*  --  255*  BUN 45*  --  38*  --  37*  CREATININE 3.61*  --  2.86*  --  2.53*  CALCIUM 7.9*  --  7.9*  --  7.9*  PHOS 5.2*  --  4.7*  --  4.0   < > = values in this interval not displayed.   Recent Labs  Lab 03/31/20 0339 03/31/20 0425 03/31/2020 0410 04/07/2020 0512 04/02/2020 1726 03/23/2020 1726 04/02/20 0429 04/03/20 0318 04/03/20 0329  WBC 13.3*   < > 19.5*   < > 34.6*  --  26.9*  --  18.5*  NEUTROABS 11.4*  --  16.9*  --   --   --  24.0*  --   --   HGB 9.3*   < > 8.5*   < > 9.4*   < > 8.8* 9.5* 7.7*  HCT 28.7*   < > 27.0*   < > 29.0*   < > 27.1* 28.0* 24.4*  MCV 92.9   < > 92.5   < > 88.1  --  87.7  --  92.1  PLT 134*   < > 153   < > 131*  --  129*  --  108*   < > = values in this interval not displayed.    Scheduled Meds: . sodium chloride   Intravenous Once  . aspirin  81 mg Per Tube Daily  . atorvastatin  80 mg Per Tube q1800  . B-complex with vitamin C  1 tablet Per Tube Daily  . bisacodyl  10 mg Oral Daily   Or  . bisacodyl  10 mg Rectal  Daily  . chlorhexidine gluconate (MEDLINE KIT)  15 mL Mouth Rinse BID  . Chlorhexidine Gluconate Cloth  6 each Topical Daily  . docusate  200 mg Per Tube Daily  . feeding supplement (PRO-STAT SUGAR FREE 64)  30 mL Per Tube QID  . influenza vaccine adjuvanted  0.5 mL Intramuscular Tomorrow-1000  . insulin aspart  0-24 Units Subcutaneous Q4H  . insulin aspart  3 Units Subcutaneous Q4H  . insulin glargine  24 Units Subcutaneous BID  . linezolid  600 mg Per Tube Q12H  . mouth rinse  15 mL Mouth Rinse Q2H  . melatonin  3 mg Per Tube QHS  . metoCLOPramide (REGLAN) injection  5 mg Intravenous Q8H  . pantoprazole (PROTONIX)  IV  40 mg Intravenous Daily  . polyethylene glycol  17 g Per Tube Daily  . QUEtiapine  25 mg Per Tube QHS  . sildenafil  20 mg Per Tube TID  . sodium chloride flush  10-40 mL Intracatheter Q12H  . Warfarin - Pharmacist Dosing Inpatient   Does not apply q1600   Continuous Infusions: .  prismasol BGK 4/2.5 500 mL/hr at 04/03/20 0340  . sodium chloride Stopped (03/22/20 2011)  . sodium chloride Stopped (03/27/20 1523)  . sodium chloride    . sodium chloride 10 mL/hr at 04/02/20 1055  . sodium chloride    . amiodarone 30 mg/hr (04/03/20 0800)  . ceFEPime (MAXIPIME) IV Stopped (04/02/20 2203)  . dexmedetomidine Stopped (04/02/20 0617)  . epinephrine 7 mcg/min (04/03/20 0800)  . feeding supplement (VITAL 1.5 CAL) 1,000 mL (04/02/20 2355)  . fentaNYL infusion INTRAVENOUS 75 mcg/hr (04/03/20 0800)  . lactated ringers Stopped (03/29/20 0748)  . milrinone 0.25 mcg/kg/min (04/03/20 0800)  . norepinephrine (LEVOPHED) Adult infusion 5 mcg/min (04/03/20 0800)  . prismasol BGK 0/2.5 1 each (04/03/20 0722)  . prismasol BGK 4/2.5 1 mL (04/03/20 0721)  . vasopressin (PITRESSIN) infusion - *FOR SHOCK* 0.04 Units/min (04/03/20 0800)   PRN Meds:.sodium chloride, acetaminophen, dextrose, fentaNYL (SUBLIMAZE) injection, fentaNYL (SUBLIMAZE) injection, heparin, heparin, hydrALAZINE,  HYDROcodone-acetaminophen, levalbuterol, ondansetron (ZOFRAN) IV, ondansetron (ZOFRAN) IV, oxyCODONE, sodium chloride, sodium chloride flush, sorbitol, traMADol  ABG    Component Value Date/Time   PHART 7.382 04/03/2020 0318   PCO2ART 46.6 04/03/2020 0318   PO2ART 108 04/03/2020 0318   HCO3 27.5 04/03/2020 0318   TCO2 29 04/03/2020 0318   ACIDBASEDEF 2.0 04/04/2020 1405   O2SAT 74.6 04/03/2020 0350    A/P  1. 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 Hypophosphatemia, replete prn for P < 2.5 3. sCHF s/p VAD 4/8 4. Hemothorax s/p VATS 03/26/2020 5. AFib s/p MAZE and LAA clipping 4/8 6. DM2 7. R MCA ischemic CVA 8. Coded overnight, L hemothorax s/p CT  9. SHock on Epi, NE and VP, on milrinone 10. Anemia  Cont CRRT, keep post bath to 0K.   Pearson Grippe, MD [facility]

## 2020-04-03 NOTE — Progress Notes (Signed)
Elmyra Ricks, bedside RN, called VAD Coordinator with "power cable disconnect" advisory alarm with both system controller cables connected to two fully charged batteries. Nurse reports this started while patient was in [place] and continued until pt returned to room and placed on power module. Advisory alarm stopped as soon as patient was placed on patient cable.  Same issue occurred last week on 03/27/20. Batteries and clips replaced. Since issue has occurred again,  Will replace current system controller [id number].  Weyman Rodney, RN replaced above controller. Pt tolerated exchange without issues.   New controller: [id number]; back up battery [id number]; expires 11/16/21.  Will contact [employer] and return affected controller for inspection.   Zada Girt RN, VAD Coordinator 24/7 VAD Pager: [phone]

## 2020-04-03 NOTE — Progress Notes (Signed)
Patient transported to radiology for CT of head with RN/RT/Transporter.  Patient on battery power and Emergency bag transported with patient.  Upon arrival to CT patients LVAD controller started to yellow alarm no battery power and red alarm no power source.  VAD coordinator Zada Girt) called and notified.  I was able to to get battery power to connect back.  CT completed and patient returned to [facility].  I then changed out patients controller with Molly at the bedside.

## 2020-04-03 NOTE — Progress Notes (Signed)
2 Days Post-Op Procedure(s) (LRB): VIDEO ASSISTED THORACOSCOPY (Left) TRANSESOPHAGEAL ECHOCARDIOGRAM (TEE) (N/A) Evacuation Hematoma (Left) Subjective: Patient mains in atrial fib-flutter with stable hemodynamics on low-dose norepinephrine and epinephrine  Patient is more responsive today and moves upper extremities.  Head CT scan without contrast is pending.  Overall edema has increased and CVVH return volume has been increased.  Chest x-ray shows interstitial edema, chest tubes in good position in left lung inflation remains satisfactory.  Chest tube output minimal.  INR remains elevated above target range, hold Coumadin Blood cultures urine cultures no growth.  Tracheal aspirate pending.  Patient receiving broad-spectrum antibiotics for elevated white count and possible pneumonia, nutrition tube feeds at goal Objective: Vital signs in last 24 hours: Temp:  [97.7 F (36.5 C)-97.8 F (36.6 C)] 97.8 F (36.6 C) (04/22 0400) Pulse Rate:  [78-230] 104 (04/22 1100) Cardiac Rhythm: Atrial fibrillation (04/22 0800) Resp:  [7-24] 17 (04/22 1100) BP: (77-119)/(45-101) 106/95 (04/22 1100) SpO2:  [94 %-99 %] 97 % (04/22 1100) Arterial Line BP: (76-116)/(55-73) 94/65 (04/22 1100) FiO2 (%):  [40 %-50 %] 40 % (04/22 0823) Weight:  [106.7 kg] 106.7 kg (04/22 0500)  Hemodynamic parameters for last 24 hours: CVP:  [4 mmHg-14 mmHg] 13 mmHg  Intake/Output from previous day: 04/21 0701 - 04/22 0700 In: 3576.4 [I.V.:2673.9; NG/GT:702.5; IV Piggyback:200.1] Out: 3994 [Chest Tube:120] Intake/Output this shift: Total I/O In: 658.9 [I.V.:398.9; NG/GT:160; IV Piggyback:100] Out: 816 [Other:816]  Exam  Sedated on ventilator but responsive today Lungs with coarse breath sounds Normal VAD hum 2+ peripheral edema  Lab Results: Recent Labs    04/02/20 0429 04/02/20 0429 04/03/20 0318 04/03/20 0329  WBC 26.9*  --   --  18.5*  HGB 8.8*   < > 9.5* 7.7*  HCT 27.1*   < > 28.0* 24.4*  PLT  129*  --   --  108*   < > = values in this interval not displayed.   BMET:  Recent Labs    04/02/20 1620 04/02/20 1620 04/03/20 0318 04/03/20 0329  NA 137   < > 134* 136  K 5.5*   < > 5.1 5.3*  CL 101  --   --  102  CO2 24  --   --  25  GLUCOSE 207*  --   --  255*  BUN 38*  --   --  37*  CREATININE 2.86*  --   --  2.53*  CALCIUM 7.9*  --   --  7.9*   < > = values in this interval not displayed.    PT/INR:  Recent Labs    04/03/20 0329  LABPROT 34.3*  INR 3.4*   ABG    Component Value Date/Time   PHART 7.382 04/03/2020 0318   HCO3 27.5 04/03/2020 0318   TCO2 29 04/03/2020 0318   ACIDBASEDEF 2.0 03/21/2020 1405   O2SAT 74.6 04/03/2020 0350   CBG (last 3)  Recent Labs    04/02/20 2008 04/02/20 2326 04/03/20 0316  GLUCAP 202* 219* 221*    Assessment/Plan: S/P Procedure(s) (LRB): VIDEO ASSISTED THORACOSCOPY (Left) TRANSESOPHAGEAL ECHOCARDIOGRAM (TEE) (N/A) Evacuation Hematoma (Left) Continue to remove volume as tolerated Head CT scan to assess known perioperative right posterior circulation stroke Continue antibiotics and follow-up cultures No Coumadin today, INR 3.4   LOS: 22 days    [name] [name] 04/03/2020

## 2020-04-03 NOTE — Progress Notes (Addendum)
LVAD Coordinator Rounding Note:  HM III LVAD implanted on 04/08/2020 with MAZE procedure + LAA clipping  by Dr Orvan Seen under destination therapy criteria due to advanced age. Primary Heart Failure Cardiologist is Dr. Haroldine Laws.   Pt lying in bed, awake, remains vented. Nodding/shaking head to answer questions, bilateral grips, L>R. Does not move lower extremities to command. Scheduled for head CT this am per [facility] nurse.   Pt remains on CVVH; nurse reports she is pulling @ 50 cc/hr.  Vital signs: Temp: 97.8 HR: 104 - afib A line: 105/70 (79) O2 Sat: 98% on vent 40% FIO2, 5 peep Wt: 253.3>262.3>260.8>259.9>237.8>220>223>222.8>236.9>235 lbs   LVAD interrogation reveals:  Speed: 5300 Flow: 4.3 Power: 3.7w  PI: 3.6  Alarms: none  Events:  >90 on 04/02/20 Hematocrit: 27  Fixed speed: 5300 Low speed limit: 5000  CRRT: pulling 50  Cc/hr   Drive Line: VAD dressing C/D/I; anchor intact and accurately applied.  Will advance to M/W/F day dressing changes per nurse champion or VAD coordinator.  Next dressing change due 04/04/20.  Labs:  LDH trend: 360>513>482>467>453>454>474>428>415>422>349>305  INR trend: 1.3>6.2>9>4.4>6.2>2.2>2.4>2.2>2.0>2.2>2.3>3.4  CR: 3.57>3.98>3.18>2.44>2.41>2.0>2.1>1.96>3.29>4.3>3.6>2.53  Anticoagulation Plan: -INR Goal: 2.0-2.5 -ASA Dose: 81 mg   Respiratory: - extubated 4/9/2 - re-intubated 03/31/2020 s/p left hemothorax with cardiac arrest  Nitric Oxide: off 03/23/20  Blood Products:  Intraop: 6 FFP  DDAVP  420 cell saver  03/19/2020>>4 FFP 03/21/20>> 1 RBC 03/22/20>>1 RBC  Post-op: 03/16/2020>>3 PC's, 2 FFP  Intra-op: 03/15/2020>>2 PCs      2 FFP  Cultures:  -03/25/20- blood cultures>>negative - 03/24/2020-blood cultures>>pending  Device: -St [name] -Therapies: VF 194 - therapy on  Drips: - Milrinone 0.25 mcg/kg/min - Epi 7 mcg/min  - Vaso 0.04 units/min - Levo 5 mcg/min - Fentanyl 50 mcg/hr - Amiodarone 30 mg/hr  Adverse Events on VAD: >>03/25/20 CVVH  Started >>03/13/2020 left hemothorax; cardiac arrest  Pt Education:  1. Education inappropriate at this time, pt is unable to respond; remains intubated.   2. No family at bedside.  Plan/Recommendations:  1. Call VAD coordinator for any equipment or drive line issues.  2. Dressing changes M/W/F per nurse champion or VAD coordinator.   Zada Girt RN [place] Coordinator  Office: [phone]  24/7 Pager: [phone]

## 2020-04-03 NOTE — Progress Notes (Addendum)
Patient ID: [name] [name], male   DOB: 1951/08/26, 69 y.o.   MRN: [id number]    Advanced Heart Failure Rounding Note   Subjective:    - S/p HM3 VAD 4/8 w MAZE procedure + LAA clipping. - Extubated 4/9 - 4/12 LVAD speed increased to 5400 --> echo moderate-severe RV dysfunction, septum mildly left shifted, trivial pericardial effusion.  LVAD speed cut back to 5300.  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  - 4/20 developed hemothorax requiring emergent CT placement and eventual VATS - 4/21 EEG done given concerns for posturing and c/w moderate to severe diffuse encephalopathy. No seizures or epileptiform discharges were seen   Remains intubated but awake and following commands.   Remains on Epi 7,  NE 5,  VP 0.04  + milrinone 0.25. Co-ox 75%.   Remains on CVVHD, pulling 50/hr. CVP 13. Wt down 1 lb.    WBC 34.6 -> 26.9->18k. AF   INR 3.4. Hgb 7.7.   LDH 467 => 453 => 454 => 474 => 428=>415 => 349=> 305    VAD Interrogation  Flow 4.2, Speed 5350, Power 3.7, Pulse Index 4.1   VAD interrogated personally. Parameters stable.   Objective:   Weight Range:  Vital Signs:   Temp:  [97.7 F (36.5 C)-97.8 F (36.6 C)] 97.8 F (36.6 C) (04/22 0400) Pulse Rate:  [79-230] 99 (04/22 0700) Resp:  [7-23] 22 (04/22 0700) BP: (77-129)/(45-114) 104/73 (04/22 0700) SpO2:  [94 %-100 %] 98 % (04/22 0700) Arterial Line BP: (74-108)/(55-77) 85/61 (04/22 0700) FiO2 (%):  [40 %-50 %] 40 % (04/22 0411) Weight:  [106.7 kg] 106.7 kg (04/22 0500) Last BM Date: 03/31/20  Weight change: Filed Weights   04/05/2020 2100 04/02/20 0500 04/03/20 0500  Weight: 108.3 kg 107.5 kg 106.7 kg    Intake/Output:   Intake/Output Summary (Last 24 hours) at 04/03/2020 0816 Last data filed at 04/03/2020 0700 Gross per 24 hour  Intake 3467.66 ml  Output 3873 ml  Net -405.34 ml       Physical Exam: CVP 13 General:  Critically ill WM, intubated but awake and responsive HEENT: normal  +  ETT +NG  Neck: supple. RIJ cath  Carotids 2+ bilat; no bruits. No lymphadenopathy or thryomegaly appreciated. Cor: + LVAD Hum  Lungs: CTAB, no wheezing, + Lt chest tube  Abdomen: soft, nontender, non-distended. No hepatosplenomegaly. No bruits or masses. Good bowel sounds. Driveline site clean. Anchor in place.  Extremities: no cyanosis, clubbing, rash. Upper extremities are edematous Neuro: awake on vent and responsive   Telemetry: A fib 100s-120s Personally reviewed   Labs: Basic Metabolic Panel: Recent Labs  Lab 03/31/20 0339 03/31/20 0425 03/19/2020 0408 03/21/2020 0410 03/28/2020 0512 03/14/2020 0512 03/31/2020 0600 04/07/2020 1616 03/22/2020 1726 04/02/2020 1726 04/02/20 0429 04/02/20 1620 04/03/20 0318 04/03/20 0329  NA 134*   < >   < >  --  132*   < >   < > 135 136  --  138 137 134* 136  K 4.9   < >   < >  --  5.9*   < >   < > 6.7* 6.6*  --  5.5* 5.5* 5.1 5.3*  CL 100   < >   < >  --  96*  --    < > 99 101  --  103 101  --  102  CO2 26   < >   < >  --  21*  --   --   --  24  --  24 24  --  25  GLUCOSE 130*   < >   < >  --  301*  --    < > 260* 214*  --  212* 207*  --  255*  BUN 29*   < >   < >  --  42*  --    < > 47* 52*  --  45* 38*  --  37*  CREATININE 1.96*   < >   < >  --  3.29*  --    < > 3.90* 4.30*  --  3.61* 2.86*  --  2.53*  CALCIUM 8.3*   < >   < >  --  8.6*   < >  --   --  7.9*   < > 7.9* 7.9*  --  7.9*  MG 2.4  --   --  2.4 2.9*  --   --   --   --   --  2.5*  --   --  2.7*  PHOS 2.2*   < >   < >  --  5.7*  --   --   --  6.6*  --  5.2* 4.7*  --  4.0   < > = values in this interval not displayed.    Liver Function Tests: Recent Labs  Lab 03/29/20 1050 03/29/20 1720 03/30/20 0324 03/30/20 0730 03/31/20 0339 03/31/20 1649 03/24/2020 0408 04/03/2020 1726 04/02/20 0429 04/02/20 1620 04/03/20 0329  AST 31  --  27  --  30  --   --   --  35  --  37  ALT 25  --  24  --  21  --   --   --  20  --  22  ALKPHOS 124  --  119  --  116  --   --   --  78  --  119  BILITOT  1.3*  --  1.0  --  0.9  --   --   --  1.4*  --  0.8  PROT 6.2*  --  5.6*  --  5.5*  --   --   --  5.2*  --  5.2*  ALBUMIN 2.5*   < > 2.3*   < > 2.2*  2.2*   < > 2.3* 2.8* 2.6*  2.6* 2.6* 2.3*  2.2*   < > = values in this interval not displayed.   No results for input(s): LIPASE, AMYLASE in the last 168 hours. Recent Labs  Lab 03/27/20 1033  AMMONIA 35    CBC: Recent Labs  Lab 03/29/20 0318 03/29/20 0330 03/30/20 0324 03/30/20 0340 03/31/20 0339 03/31/20 0425 03/13/2020 0410 03/28/2020 0512 03/24/2020 0739 03/31/2020 0739 03/19/2020 1042 03/31/2020 1405 04/09/2020 1616 03/30/2020 1726 04/02/20 0429 04/03/20 0318 04/03/20 0329  WBC 13.8*   < > 13.7*   < > 13.3*   < > 19.5*   < > 30.1*  --  45.4*  --   --  34.6* 26.9*  --  18.5*  NEUTROABS 11.8*  --  11.6*  --  11.4*  --  16.9*  --   --   --   --   --   --   --  24.0*  --   --   HGB 10.2*   < > 9.5*   < > 9.3*   < > 8.5*   < > 9.1*   < > 9.4*   < > 10.2*  9.4* 8.8* 9.5* 7.7*  HCT 31.5*   < > 29.4*   < > 28.7*   < > 27.0*   < > 27.8*   < > 29.0*   < > 30.0* 29.0* 27.1* 28.0* 24.4*  MCV 92.4   < > 90.5   < > 92.9   < > 92.5   < > 89.7  --  91.2  --   --  88.1 87.7  --  92.1  PLT 152   < > 134*   < > 134*   < > 153   < > 152  --  170  --   --  131* 129*  --  108*   < > = values in this interval not displayed.    Cardiac Enzymes: No results for input(s): CKTOTAL, CKMB, CKMBINDEX, TROPONINI in the last 168 hours.  BNP: BNP (last 3 results) Recent Labs    03/21/20 0209 03/26/20 2329 04/02/20 2342  BNP 315.9* 425.7* 332.7*    ProBNP (last 3 results) No results for input(s): PROBNP in the last 8760 hours.    Other results:  Imaging: EEG  Result Date: 04/02/2020 Lora Havens, MD     04/02/2020  2:16 PM Patient Name: ARNEL WYMER MRN: [id number] Epilepsy Attending: Lora Havens Referring Physician/Provider: Dr Glori Bickers Date: 04/02/2020 Duration: 24.48 mins Patient history: 69 yo M with Large posterior right  MCA infarct and was noted to have posturing. EEG to evaluate for seizure. Level of alertness: comatose AEDs during EEG study: None Technical aspects: This EEG study was done with scalp electrodes positioned according to the 10-20 International system of electrode placement. Electrical activity was acquired at a sampling rate of _0  and reviewed with a high frequency filter of _1  and a low frequency filter of _2 . EEG data were recorded continuously and digitally stored. DESCRIPTION:  EEG showed continuous generalized polymorphic 3-_3  theta-delta slowing. Hyperventilation and photic stimulation were not performed. ABNORMALITY - Continuous slow, generalized IMPRESSION: This study is suggestive of moderate to severe diffuse encephalopathy, non specific to etiology. No seizures or epileptiform discharges were seen throughout the recording. Lora Havens   DG Chest 1 View  Result Date: 04/09/2020 CLINICAL DATA:  Endotracheally intubated. EXAM: CHEST  1 VIEW COMPARISON:  1-1/2 hours prior. CT 02/16/2020 FINDINGS: Endotracheal tube tip at the clavicular heads. There are 2 enteric tubes in place, both tips in the proximal stomach. Right subclavian central venous catheter tip in the mid SVC. Right internal jugular central venous catheter tip in the lower SVC. Left-sided chest tubes in place. Left ventricular assist device in place. Post left atrial clipping. Stable cardiomegaly. Aortic atherosclerosis. Patchy opacity in the left mid lung, not significantly changed. Retrocardiac opacity may be worsened, possible pleural effusion. No visualized pneumothorax. Overall vascular congestion. IMPRESSION: 1. Endotracheal tube tip at the clavicular heads. 2. Two enteric tubes in place, both tips in the proximal stomach. Additional support apparatus unchanged. 3. Unchanged cardiomegaly, vascular congestion, and left midlung opacity. Retrocardiac opacity may be worsened. Electronically Signed   By: Keith Rake M.D.    On: 04/07/2020 19:01   DG Abd 1 View  Result Date: 04/11/2020 CLINICAL DATA:  Orogastric tube placement. EXAM: ABDOMEN - 1 VIEW COMPARISON:  March 26, 2020. FINDINGS: Moderately distended air-filled stomach is noted. No large or small bowel dilatation is noted. Distal tips of nasogastric tube and feeding tube are seen in the proximal stomach. No radio-opaque calculi or other significant radiographic abnormality are seen.  IMPRESSION: Distal tips of nasogastric tube and feeding tube are seen in the proximal stomach. Moderate gastric distention is noted. Electronically Signed   By: Marijo Conception M.D.   On: 03/13/2020 12:38   DG Chest Port 1 View  Result Date: 04/03/2020 CLINICAL DATA:  Left ventricular assist device. EXAM: PORTABLE CHEST 1 VIEW COMPARISON:  04/02/2020 FINDINGS: The endotracheal tube, NG tube, feeding tube, right IJ Cordis, right subclavian central venous catheter and left-sided chest tubes are stable. Left ventricular assist device in stable position without complicating features. Stable cardiac enlargement, vascular congestion, pulmonary edema and left retrocardiac density are unchanged. No pneumothorax. IMPRESSION: 1. Stable support apparatus. 2. Persistent cardiac enlargement, vascular congestion, pulmonary edema and left retrocardiac density. Electronically Signed   By: Marijo Sanes M.D.   On: 04/03/2020 06:44   DG Chest Port 1 View  Result Date: 04/02/2020 CLINICAL DATA:  Left ventricular assist device. EXAM: PORTABLE CHEST 1 VIEW COMPARISON:  04/08/2020 FINDINGS: The endotracheal tube, feeding tube, right IJ Cordis, right subclavian central venous catheters and left chest tube are stable. The left ventricular assist device appears stable. No complicating features are demonstrated. Stable cardiac enlargement, central vascular congestion and pulmonary edema. Suspect persistent left effusion. IMPRESSION: 1. Stable support apparatus. 2. Persistent cardiac enlargement, central vascular  congestion and pulmonary edema. 3. Suspect left pleural effusion and left lower lobe atelectasis. Electronically Signed   By: Marijo Sanes M.D.   On: 04/02/2020 08:59   DG Chest Portable 1 View  Result Date: 03/13/2020 CLINICAL DATA:  Line placement EXAM: PORTABLE CHEST 1 VIEW COMPARISON:  04/04/2020 FINDINGS: Support Apparatus: --Endotracheal tube: Tip at the level of the clavicular heads. --Enteric tube:Tip projects over the stomach --Catheter(s):Right IJ central venous catheter tip at the cavoatrial junction. --Other: Left chest wall AICD leads in expected position. Right IJ sheath with tip projecting over the mid SVC. Left ventricular assist device. Moderate opacity in the left lung, improved from the prior study. Left chest tube tips project at the left lung apex. IMPRESSION: 1. Right IJ central venous catheter tip at the cavoatrial junction. 2. Radiographically appropriate position of endotracheal tube. 3. Improved aeration of the left lung compared to the prior study. Electronically Signed   By: Ulyses Jarred M.D.   On: 04/07/2020 17:32   DG Chest Port 1 View  Result Date: 04/10/2020 CLINICAL DATA:  Left pleural effusion. EXAM: PORTABLE CHEST 1 VIEW COMPARISON:  Radiograph of same day. FINDINGS: Endotracheal and feeding tubes are unchanged in position. Right subclavian catheter is unchanged in position. Left ventricular assist device is unchanged. Left-sided pacemaker is unchanged. No pneumothorax is noted. There is complete opacification of the left hemithorax consistent with effusion and/or atelectasis. Left-sided chest tube is unchanged in position. Mild right basilar atelectasis or edema is noted. Bony thorax is unremarkable. IMPRESSION: Stable support apparatus. Continued diffuse opacification of left hemithorax as described above. Mild right basilar atelectasis or edema. Electronically Signed   By: Marijo Conception M.D.   On: 04/03/2020 12:40   DG Abd Portable 1V  Result Date:  04/02/2020 CLINICAL DATA:  Feeding tube placement. EXAM: PORTABLE ABDOMEN - 1 VIEW COMPARISON:  None. FINDINGS: The bowel gas pattern is normal. Distal tip of feeding tube is seen in expected position proximal duodenum. No radio-opaque calculi or other significant radiographic abnormality are seen. IMPRESSION: Distal tip of feeding tube seen in expected position proximal duodenum. Electronically Signed   By: Marijo Conception M.D.   On: 04/02/2020 11:54  Medications:     Scheduled Medications: . sodium chloride   Intravenous Once  . aspirin  81 mg Per Tube Daily  . atorvastatin  80 mg Per Tube q1800  . B-complex with vitamin C  1 tablet Per Tube Daily  . bisacodyl  10 mg Oral Daily   Or  . bisacodyl  10 mg Rectal Daily  . chlorhexidine gluconate (MEDLINE KIT)  15 mL Mouth Rinse BID  . Chlorhexidine Gluconate Cloth  6 each Topical Daily  . docusate  200 mg Per Tube Daily  . feeding supplement (PRO-STAT SUGAR FREE 64)  30 mL Per Tube QID  . influenza vaccine adjuvanted  0.5 mL Intramuscular Tomorrow-1000  . insulin aspart  0-24 Units Subcutaneous Q4H  . insulin aspart  3 Units Subcutaneous Q4H  . insulin glargine  24 Units Subcutaneous BID  . linezolid  600 mg Per Tube Q12H  . mouth rinse  15 mL Mouth Rinse Q2H  . melatonin  3 mg Per Tube QHS  . metoCLOPramide (REGLAN) injection  5 mg Intravenous Q8H  . pantoprazole (PROTONIX) IV  40 mg Intravenous Daily  . polyethylene glycol  17 g Per Tube Daily  . QUEtiapine  25 mg Per Tube QHS  . sildenafil  20 mg Per Tube TID  . sodium chloride flush  10-40 mL Intracatheter Q12H  . Warfarin - Pharmacist Dosing Inpatient   Does not apply q1600    Infusions: .  prismasol BGK 4/2.5 500 mL/hr at 04/03/20 0340  . sodium chloride Stopped (03/22/20 2011)  . sodium chloride Stopped (03/27/20 1523)  . sodium chloride    . sodium chloride 10 mL/hr at 04/02/20 1055  . sodium chloride    . amiodarone 30 mg/hr (04/03/20 0700)  . ceFEPime (MAXIPIME)  IV Stopped (04/02/20 2203)  . dexmedetomidine Stopped (04/02/20 0617)  . epinephrine 7 mcg/min (04/03/20 0700)  . feeding supplement (VITAL 1.5 CAL) 1,000 mL (04/02/20 2355)  . fentaNYL infusion INTRAVENOUS 75 mcg/hr (04/03/20 0700)  . lactated ringers Stopped (03/29/20 0748)  . milrinone 0.25 mcg/kg/min (04/03/20 0700)  . norepinephrine (LEVOPHED) Adult infusion 5 mcg/min (04/03/20 0700)  . prismasol BGK 0/2.5 1 each (04/03/20 0722)  . prismasol BGK 4/2.5 1 mL (04/03/20 0721)  . vasopressin (PITRESSIN) infusion - *FOR SHOCK* 0.04 Units/min (04/03/20 0700)    PRN Medications: sodium chloride, acetaminophen, dextrose, fentaNYL (SUBLIMAZE) injection, fentaNYL (SUBLIMAZE) injection, heparin, heparin, hydrALAZINE, HYDROcodone-acetaminophen, levalbuterol, ondansetron (ZOFRAN) IV, ondansetron (ZOFRAN) IV, oxyCODONE, sodium chloride, sodium chloride flush, sorbitol, traMADol   Assessment/Plan:   1. Acute on chronic systolic HF -> cardiogenic shock-> S/p HM3 LVAD - Echo 2016 EF 30-35% - Echo 1/21 EF 25% - Admitted with NYHA IV symptoms and AKI with attempts at diuresis.  - Echo this admit: EF 10-15% moderate RV dysfunction - R/LHC cath 3/21 with severe 3v CAD and low output with CI 1.7.  - Swan placed. Initial co-ox 39%. Initial PAPi 1.5. Started on milrinone 0.25. - Initially planned for CABG but given need for re-do sternotomy, relatively poor targets and longstanding low EF, VAD felt to be better option  - S/p HM3 VAD 4/8 - Speed turned up on 4/11 to  5400, but looking at 4/10 echo there is significant RV dysfunction with leftward septal shift so speed decreased back to 5300 rpm.  - Developed hemothorax on 4/20 requiring emergent chest tube and VATS - CXR much improved. But hemodynamically and neurologically tenuous requiring high-dose pressors in setting of RV failure and hemothorax - On  NE 5, Epi 7, VP 0.04 + Milrinone 0.25. Co-ox 75% - Wean pressors as tolerated - remains on vent but  awake and more responsive today - Continue broad spectrum abx, linezolid and cefepime. WBC trending down   2. VAD - VAD interrogated personally. Parameters stable. - LDH stable at 305 - INR  3.4  - Driveline ok    3. CAD with unstable angina - s/p previous PCI - cath 03/07/2020 with severe 3v CAD - now s/p VAD on 04/05/2020    4. AKI on CKD 3a - Nephrology consulted. Started CVVHD 4/13  - Renal US 4/5 unremarkable.  - CVP ~13. Weight up 8 pounds from preop wt  Run CVVHD -50 - Hopefully renal function will recover though likely set back by recent events  5. Permanent AF - now s/p MAZE + LAA Clipping 4/8   - Continue IV amio while on high-dose pressors - Warfarin as above.  6. MVP s/p MV repair - On admit with recurrent severe MR on echo and with huge v-waves on PCWP tracing - s/p VAD  7. DM2, poorly controlled - hgbA1c 11.1% - continue insulin and SSI   8. ID/leukocytosis - 4/11 procalcitonin 1.96  - Started empiric coverage for PNA with vancomycin/cefepime for 7 days. Stopped 4/17 - Given recent VATs and high-dose pressor requirement will cover with broad spectrum abx for now, linezolid and cefepime. WBC trending down   9. Acute Hypoxic Respiratory Failure - Re-intubated 4/20 with hemothorax - not ready for wean yet  10. Malnutrition  - Albumin 2.9   - On tube feeds.    11. CVA/anoxic brain injury - 03/27/20 CT large posterior MCA ischemic infarct, possibly from atrial fibrillation.  INR was supratherapeutic when CVA found.  Stable LDH, do not think partial pump thrombosis is the culprit. - had hypotension with events on 4/20.  RN concern over posturing. EEG 4/21 demonstrated moderate to severe diffuse encephalopathy. No seizures or epileptiform discharges were seen   12. Anemia - Hgb 7.7 today  - transfuse for hgb < 7  13. Hyperkalemia - K 5.3 - continue CVVHD   Length of Stay: [place] PA-C  04/03/2020, 8:16 AM  Advanced Heart Failure  Team Pager [phone] (M-F; 7a - 4p)  Please contact [facility] for night-coverage after hours (4p -7a ) and weekends on [email or web address]  Agree with above.  More awake this morning. Will respond to voice and follow some commands. Remains on EPI, NE and VP. Weaning slowly. VAD interrogated personally. Parameters stable. CXR improved after VATS. WBC improving on broad spectrum abx. Remains on CVVHD. Last night would move left leg for me but this am not moving wither LE very well.   General:  Awake on vent. Will follow some commands HEENT: normal  + ETT Neck: supple. JVP up  + IJ cath.  Carotids 2+ bilat; no bruits. No lymphadenopathy or thryomegaly appreciated. Cor: LVAD hum.  Lungs: Clear coarse on left with CT Abdomen: soft, nontender, non-distended. No hepatosplenomegaly. No bruits or masses. Good bowel sounds. Driveline site clean. Anchor in place.  Extremities: no cyanosis, clubbing, rash. Warm 1-2+ edema in flanks  Neuro:  Awake on vent. Will follow some commands. Not moving legs well  Remains quite tenuous. Waking up but neuro exam concerning for LE weakness. Will repeat head CT today. EEG with slowing but no sz activity. Continue abx. Pull -75 with CCVHD. Wean pressors as tolerated. Continue amio for AF. INR 3.4 so no coumadin today.  CRITICAL CARE Performed by: Glori Bickers  Total critical care time: 35 minutes  Critical care time was exclusive of separately billable procedures and treating other patients.  Critical care was necessary to treat or prevent imminent or life-threatening deterioration.  Critical care was time spent personally by me (independent of midlevel providers or residents) on the following activities: development of treatment plan with patient and/or surrogate as well as nursing, discussions with consultants, evaluation of patient's response to treatment, examination of patient, obtaining history from patient or surrogate, ordering and performing treatments  and interventions, ordering and review of laboratory studies, ordering and review of radiographic studies, pulse oximetry and re-evaluation of patient's condition.  Glori Bickers, MD  9:47 AM

## 2020-04-03 NOTE — Progress Notes (Signed)
RT note- Patient transported to CT and back to unit. 

## 2020-04-03 NOTE — Progress Notes (Signed)
ANTICOAGULATION CONSULT NOTE  Pharmacy Consult for warfarin Indication: post op LVAD  / Aflutter  Allergies  Allergen Reactions  . Liraglutide Nausea And Vomiting  . Lisinopril Cough    Patient Measurements: Height: 6' (182.9 cm) Weight: 106.7 kg (235 lb 3.7 oz) IBW/kg (Calculated) : 77.6 Heparin Dosing Weight: 101.2 kg  Vital Signs: Temp: 97.8 F (36.6 C) (04/22 0400) Temp Source: Oral (04/22 0400) BP: 104/73 (04/22 0700) Pulse Rate: 99 (04/22 0700)  Labs: Recent Labs    03/31/2020 0410 04/08/2020 0512 03/25/2020 1726 03/31/2020 1726 04/02/20 0429 04/02/20 0429 04/02/20 1620 04/03/20 0318 04/03/20 0329  HGB 8.5*   < > 9.4*   < > 8.8*   < >  --  9.5* 7.7*  HCT 27.0*   < > 29.0*   < > 27.1*  --   --  28.0* 24.4*  PLT 153   < > 131*  --  129*  --   --   --  108*  LABPROT 24.7*  --   --   --  25.4*  --   --   --  34.3*  INR 2.2*  --   --   --  2.3*  --   --   --  3.4*  CREATININE  --    < > 4.30*   < > 3.61*  --  2.86*  --  2.53*   < > = values in this interval not displayed.    Estimated Creatinine Clearance: 34.3 mL/min (A) (by C-G formula based on SCr of 2.53 mg/dL (H)).   Medical History: Past Medical History:  Diagnosis Date  . Anxiety   . Arthritis   . CAD (coronary artery disease)    a.  1993 s/p MI - [employer];  b. s/p BMS to LAD '00;  c. PTCA 2nd diagonal 2010;  d. 02/18/12 Cath: moderate nonobs dzs - med rx;  e.  01/2015 Cath: LM nl, LAD 40-53m ISR, 70-13m/d, d1 90p (3.0x16 Synergy DES), D2 50-60, LCX nl, OM1 50p, 48m (2.5x12 Synergy DES), RCA nl, EF 30-35%.  . Chronic combined systolic and diastolic CHF (congestive heart failure) ([place])    a. 12/2014 Echo: EF 30-35%, Gr2 DD, mod MR, sev dil LA.  . CKD (chronic kidney disease), stage III   . Depression   . ED (erectile dysfunction)   . GERD (gastroesophageal reflux disease)   . Hyperlipidemia   . Hypertension   . Ischemic cardiomyopathy    a. s/p [employer] [name] (Atlas) ICD implanted in [place] 2007;  b.  12/2014 Echo: Ef 30-35%.  . MVP (mitral valve prolapse)    a. s/p MV annuloplasty at [facility] 2004.  Marland Kitchen Persistent atrial fibrillation ([place])    a. noted on ICD interrogation '10 - not previously on [place] - CHA2DS2VASc = 5.  . Type II diabetes mellitus ([place])    uncontrolled    Medications:  Medications Prior to Admission  Medication Sig Dispense Refill Last Dose  . ALPRAZolam (XANAX) 0.25 MG tablet Take 0.25 mg by mouth 3 (three) times daily as needed for anxiety.    unknown  . amiodarone (PACERONE) 200 MG tablet Take 200 mg by mouth daily.    03/11/2020 at Unknown time  . apixaban (ELIQUIS) 5 MG TABS tablet Take 1 tablet (5 mg total) by mouth 2 (two) times daily. 60 tablet 1 03/11/2020 at 0600  . atorvastatin (LIPITOR) 40 MG tablet Take 1 tablet (40 mg total) by mouth daily at 6 PM. 30 tablet  0 03/10/2020 at Unknown time  . citalopram (CELEXA) 20 MG tablet Take 20 mg by mouth daily.    03/11/2020 at Unknown time  . furosemide (LASIX) 40 MG tablet Take 40 mg by mouth daily.    03/11/2020 at Unknown time  . insulin aspart (NOVOLOG FLEXPEN) 100 UNIT/ML FlexPen Inject 6 Units into the skin 3 (three) times daily with meals. 15 mL 0 03/11/2020 at Unknown time  . Insulin Glargine (BASAGLAR KWIKPEN) 100 UNIT/ML SOPN Inject 0.5 mLs (50 Units total) into the skin daily. 15 mL 1 03/11/2020 at Unknown time  . metoprolol succinate (TOPROL-XL) 100 MG 24 hr tablet Take 1 tablet (100 mg total) by mouth daily. 90 tablet 3 03/11/2020 at 0600  . nitroGLYCERIN (NITROSTAT) 0.4 MG SL tablet Place 1 tablet (0.4 mg total) under the tongue every 5 (five) minutes x 3 doses as needed for chest pain. 10 tablet 0 unknown  . pantoprazole (PROTONIX) 40 MG tablet Take 1 tablet (40 mg total) by mouth daily. 30 tablet 1 03/11/2020 at Unknown time  . potassium chloride SA (KLOR-CON) 20 MEQ tablet Take 20 mEq by mouth daily.    03/11/2020 at Unknown time  . tamsulosin (FLOMAX) 0.4 MG CAPS capsule Take 1 capsule (0.4 mg total) by mouth  daily. 30 capsule 0 03/11/2020 at Unknown time  . ALPRAZolam (XANAX) 1 MG tablet Take 1 tablet (1 mg total) by mouth 2 (two) times daily as needed for anxiety. (Patient not taking: Reported on 02/13/2020) 10 tablet 0 Not Taking at Unknown time    Assessment: 35 YOM with atrial fibrillation on Eliquis PTA s/p R & L heart cath found to have 3v disease s/p  LVAD  Implant 4/8. Warfarin initiated on 4/9 after discussing with MD.  CT on 4/15 now found to have large right MCA stroke. INR had been elevated up to 9.0 postop, he received 1 dose of vitamin K 1 mg IV on 4/13 and again on 4/15 with INR still elevated at 6.2.   Warfarin resumed cautiously on 4/17. Held doses since 4/20 since he went to the OR for VATS.   INR is now above goal at 3.4. Hgb down to 7.7. Pltc down to 108. LDH down 305. No bleeding per RN.   Goal of Therapy:  INR 2-2.5 Monitor platelets by anticoagulation protocol: Yes   Plan:  -Hold warfarin again today -Daily INR  Vertis Kelch, PharmD, [facility] PGY2 Cardiology Pharmacy Resident Phone [phone] 04/03/2020       8:16 AM  Please check [email or web address] for unit-specific pharmacist phone numbers

## 2020-04-03 NOTE — Progress Notes (Signed)
PULMONARY / CRITICAL CARE MEDICINE   NAME:  [name] [name], MRN:  [id number], DOB:  1951/03/13, LOS: 40 ADMISSION DATE:  02/20/2020, CONSULTATION DATE:  03/27/20 REFERRING MD:  Aundra Dubin, CHIEF COMPLAINT:  Respiratory failure  BRIEF HISTORY:    69 year old man with advanced ischemic cardiomyopathy, prior mitral valve repair, afib presented for redo sternotomy, LVAD implantation, left atrial appendage clipping, and sternal reconstruction on 03/19/2020.  Hospitalization complicated by renal failure, RV dysfunction, persistent delirium, and R MCA stroke.  Respiratory status has been tenuous.  On the morning of 4/20, patient became agonal with low flow on LVAD followed by intubation and two rounds of epi.  PCCM re-consulted.  HISTORY OF PRESENT ILLNESS   69 year old man with advanced ischemic cardiomyopathy, prior mitral valve repair, afib presented for redo sternotomy, LVAD implantation, left atrial appendage clipping, and sternal reconstruction on 04/04/2020.    Postoperative course complicated by persistent delirium, RV dysfunction, renal failure, abnormal INR of unclear etiology.  Started on CRRT 4/13.  Patient less responsive.  Found on CT head 4/15 with acute posterior R MCA stroke.  Neurology following.  Respiratory status has been tenuous post op as well in which PCCM initially consulted for.  Has remained mostly on BIPAP.  On 4/16, on 5 L [place] and saturating well. He is alert and oriented to self today (4/16).  PCCM available on as needed basis.  On the morning of 4/20, patient became agonal with low flow on LVAD followed by intubation and two rounds of epi.  PCCM re-consulted.  SIGNIFICANT PAST MEDICAL HISTORY   Ischemic cardiomyopathy MV prolapse s/p repair in 2004 HTN HLD CKD  SIGNIFICANT EVENTS:  4/8: redo sternotomy, LVAD implantation, left atrial appendage clipping, and sternal reconstruction  4/9: extubated 4/13 CRRT started  4/15 R MCA CVA on [facility] 4/16 off Bipap, on 5L [place] 4/19 CRRT  stopped, respiratory arrest, intubated, chest tube placed for L hemothorax  4/20 L VATS with 2 pleural drains placed   STUDIES:   CT Head 03/27/20 IMPRESSION: Large posterior right MCA infarct, new since prior study.  CT a/p 03/21/2020 IMPRESSION: 1. No acute intra-abdominal or pelvic pathology. No bowel obstruction. Normal appendix. 2. Cirrhosis with small perihepatic ascites. 3. Cholelithiasis. No evidence of acute cholecystitis by CT. 4. Aortic Atherosclerosis (ICD10-I70.0).  4/15 CXR volume overload  Echo 4/10 1. Images are poor quality and cannot comment on global LV function. .  Left ventricular ejection fraction, by estimation, is 20 to 25%. The left  ventricle has severely decreased function. The left ventricle demonstrates  global hypokinesis. There is  moderate concentric left ventricular hypertrophy.  2. Aortic dilatation noted. There is mild dilatation of the ascending  aorta and of the aortic root measuring 41 mm and 14mm respectively.  3. Right ventricular systolic function is severely reduced. The right  ventricular size is moderately enlarged.  4. The aortic valve is tricuspid. Mild aortic valve sclerosis is present,  with no evidence of aortic valve stenosis.   CULTURES:  COVID swab neg on admission MRSA PCR neg 4/7 Blood cultures x 2 4/13 neg Urine culture 4/12 neg  Bcx 4/20>  Respiratory cx 4/20>   ANTIBIOTICS:  Vanc 4/12 - 4/17 Cefepime 4/11 - 4/17 Linezolid 4/20 >  Cefepime 4/20>   LINES/TUBES:  4/8: redo sternotomy, LVAD implantation, left atrial appendage clipping, and sternal reconstruction   RIJ introducer >> R [place] HD catheter >> R radial A line >> out R/L pleural drains  >> out  4/20 ETT >> 4/20 R radial aline >> 4/20 left chest tube, 2 pleural drains >>  CONSULTANTS:  CHF, TCTS, PCCM, Nephro, Neuro, Hematology  SUBJECTIVE:  No acute events overnight. Linezolid + cefepime started for rising WBC. Did not pass SBT this AM. Not  initiating many breaths but on Fentanyl @ 75.   CONSTITUTIONAL: BP 101/87   Pulse (!) 101   Temp 97.8 F (36.6 C) (Oral)   Resp (!) 22   Ht 6' (1.829 m)   Wt 106.7 kg   SpO2 98%   BMI 31.90 kg/m   I/O last 3 completed shifts: In: 6250.7 [I.V.:3877; Blood:1332; NG/GT:391.7; IV Piggyback:650.1] Out: 5980 [Emesis/NG output:1000; YWVPX:1062; Blood:700; Chest Tube:1430]  CVP:  [4 mmHg-14 mmHg] 9 mmHg  Vent Mode: PRVC FiO2 (%):  [40 %-50 %] 40 % Set Rate:  [22 bmp] 22 bmp Vt Set:  [694 mL] 620 mL PEEP:  [5 cmH20] 5 cmH20 Plateau Pressure:  [20 WNI62-70 cmH20] 21 cmH20  PHYSICAL EXAM: General: elderly male, critically ill, in NAD  HEENT: ETT in place, minimal pupillary response bilaterally Neuro: opens eyes to sternal rub, answering simple questions appropriately and following simple commands. Appears to be weaker on the L side.  PULM: coarse and venter breath sounds bilaterally, diminished on the L  CV: LVAD hum    GI: soft, NTND, hypoactive bowel sounds  Extremities: 1+ pitting edema up to thigh  Skin: warm   RESOLVED PROBLEM LIST    ASSESSMENT AND PLAN    # Acute hypoxic respiratory failure secondary to L hemothorax s/p L CT placement and VATS 4/20 -- More awake this AM, but did not pass SBT while on sedation. Will decrease Fentanyl gtt dose and decrease RR to 18 to facilitate weaning process. Will follow up SBT to morrow  -- PAD protocol  -- VAP precautions in place   -- L Chest tube and 2 pleural drain in place with minimal bloody drainage   -- CXR this AM showed vascular congestion, cont' CVHD for fluid removal  -- Follow up repeat respiratory and Bcx, collected 4/20 -- Palliative care involved and updating family  # Cardiogenic shock   # Suspected PEA arrest  # ICM s/p LVAD placement 4/8 for destination therapy  -- No obvious tamponade physiology on bedside TTE by Dr. Haroldine Laws  -- On epi, norepi, and vaso gtt. Recommend weaning epi off, can uptitrate norepi to  help with this. Will defer final management to TCTS  -- Co-ox 74 this AM  -- Trend H/H -- Management per TCT/CHF team -- On ASA,holding Coumdin as Hgb trending down x 2 days -- INR goal 2-3    # Right MCA cardioembolic stroke, suspected secondary to Afib  # Seizure like-activity  -- Neuro signed off 4/17. Recommended continuing AC with ASA and warfarin  -- Patient noted to have rhythmic movements of bilateral UE 4/21. EEG showed moderate to severe diffuse encephalopathy, but no seizures or epileptiform discharges.  -- Somewhat more alert today and following some commands   # Chronic afiblled -- SSI, lantus  -- S/p maze and LAA clipping 4/8 -- Holding coumadin due to L hemothorax -- On amiodarone 200 mg BID   # Acute renal failure on CKD  -- Nephrology following  -- On CRRT 4/19   -- Remains anuric -- Trend renal panel/ mag daily   # DM type II uncontro  # GOC:  -- Palliative care following     Best Practice / Goals of Care /  Disposition.   Diet: TF on hold Pain/Anxiety/Delirium protocol (if indicated): fentanyl and precedex gtt VAP protocol (if indicated): yes DVT prophylaxis: holding warfarin, SCD  GI prophylaxis: PPI Glucose control: SSI Mobility: BR Code Status: partial- no compression given LVAD Family Communication: per primary Disposition: ICU  LABS  Glucose Recent Labs  Lab 04/02/20 0844 04/02/20 1152 04/02/20 1623 04/02/20 2008 04/02/20 2326 04/03/20 0316  GLUCAP 185* 170* 193* 202* 219* 221*    BMET Recent Labs  Lab 04/02/20 0429 04/02/20 0429 04/02/20 1620 04/03/20 0318 04/03/20 0329  NA 138   < > 137 134* 136  K 5.5*   < > 5.5* 5.1 5.3*  CL 103  --  101  --  102  CO2 24  --  24  --  25  BUN 45*  --  38*  --  37*  CREATININE 3.61*  --  2.86*  --  2.53*  GLUCOSE 212*  --  207*  --  255*   < > = values in this interval not displayed.    Liver Enzymes Recent Labs  Lab 03/31/20 0339 03/31/20 1649 04/02/20 0429 04/02/20 1620  04/03/20 0329  AST 30  --  35  --  37  ALT 21  --  20  --  22  ALKPHOS 116  --  78  --  119  BILITOT 0.9  --  1.4*  --  0.8  ALBUMIN 2.2*  2.2*   < > 2.6*  2.6* 2.6* 2.3*  2.2*   < > = values in this interval not displayed.    Electrolytes Recent Labs  Lab 03/31/2020 0512 04/05/2020 1726 04/02/20 0429 04/02/20 1620 04/03/20 0329  CALCIUM 8.6*   < > 7.9* 7.9* 7.9*  MG 2.9*  --  2.5*  --  2.7*  PHOS 5.7*   < > 5.2* 4.7* 4.0   < > = values in this interval not displayed.    CBC Recent Labs  Lab 04/07/2020 1726 03/19/2020 1726 04/02/20 0429 04/03/20 0318 04/03/20 0329  WBC 34.6*  --  26.9*  --  18.5*  HGB 9.4*   < > 8.8* 9.5* 7.7*  HCT 29.0*   < > 27.1* 28.0* 24.4*  PLT 131*  --  129*  --  108*   < > = values in this interval not displayed.    ABG Recent Labs  Lab 04/10/2020 1405 03/24/2020 1743 04/03/20 0318  PHART 7.399 7.349* 7.382  PCO2ART 36.9 47.3 46.6  PO2ART 235.0* 203* 108    Coag's Recent Labs  Lab 03/14/2020 0410 04/02/20 0429 04/03/20 0329  INR 2.2* 2.3* 3.4*    Sepsis Markers No results for input(s): LATICACIDVEN, PROCALCITON, O2SATVEN in the last 168 hours.  Cardiac Enzymes No results for input(s): TROPONINI, PROBNP in the last 168 hours.    Please refer to attending attestation for further details.

## 2020-04-04 ENCOUNTER — Inpatient Hospital Stay (HOSPITAL_COMMUNITY): Payer: Medicare HMO

## 2020-04-04 DIAGNOSIS — J9601 Acute respiratory failure with hypoxia: Secondary | ICD-10-CM | POA: Diagnosis not present

## 2020-04-04 DIAGNOSIS — R570 Cardiogenic shock: Secondary | ICD-10-CM | POA: Diagnosis not present

## 2020-04-04 LAB — COOXEMETRY PANEL
Carboxyhemoglobin: 1.6 % — ABNORMAL HIGH (ref 0.5–1.5)
Methemoglobin: 1 % (ref 0.0–1.5)
O2 Saturation: 79.4 %
Total hemoglobin: 11.8 g/dL — ABNORMAL LOW (ref 12.0–16.0)

## 2020-04-04 LAB — CBC
HCT: 24.7 % — ABNORMAL LOW (ref 39.0–52.0)
Hemoglobin: 7.7 g/dL — ABNORMAL LOW (ref 13.0–17.0)
MCH: 29.4 pg (ref 26.0–34.0)
MCHC: 31.2 g/dL (ref 30.0–36.0)
MCV: 94.3 fL (ref 80.0–100.0)
Platelets: 124 10*3/uL — ABNORMAL LOW (ref 150–400)
RBC: 2.62 MIL/uL — ABNORMAL LOW (ref 4.22–5.81)
RDW: 17 % — ABNORMAL HIGH (ref 11.5–15.5)
WBC: 13.1 10*3/uL — ABNORMAL HIGH (ref 4.0–10.5)
nRBC: 0.2 % (ref 0.0–0.2)

## 2020-04-04 LAB — PROTIME-INR
INR: 2.4 — ABNORMAL HIGH (ref 0.8–1.2)
Prothrombin Time: 25.9 seconds — ABNORMAL HIGH (ref 11.4–15.2)

## 2020-04-04 LAB — RENAL FUNCTION PANEL
Albumin: 2.1 g/dL — ABNORMAL LOW (ref 3.5–5.0)
Albumin: 2.3 g/dL — ABNORMAL LOW (ref 3.5–5.0)
Anion gap: 11 (ref 5–15)
Anion gap: 8 (ref 5–15)
BUN: 35 mg/dL — ABNORMAL HIGH (ref 8–23)
BUN: 33 mg/dL — ABNORMAL HIGH (ref 8–23)
CO2: 25 mmol/L (ref 22–32)
CO2: 26 mmol/L (ref 22–32)
Calcium: 8.2 mg/dL — ABNORMAL LOW (ref 8.9–10.3)
Calcium: 8.1 mg/dL — ABNORMAL LOW (ref 8.9–10.3)
Chloride: 103 mmol/L (ref 98–111)
Chloride: 102 mmol/L (ref 98–111)
Creatinine, Ser: 2.25 mg/dL — ABNORMAL HIGH (ref 0.61–1.24)
Creatinine, Ser: 2.05 mg/dL — ABNORMAL HIGH (ref 0.61–1.24)
GFR calc Af Amer: 33 mL/min — ABNORMAL LOW (ref >60)
GFR calc Af Amer: 37 mL/min — ABNORMAL LOW (ref >60)
GFR calc non Af Amer: 28 mL/min — ABNORMAL LOW (ref >60)
GFR calc non Af Amer: 32 mL/min — ABNORMAL LOW (ref >60)
Glucose, Bld: 188 mg/dL — ABNORMAL HIGH (ref 70–99)
Glucose, Bld: 162 mg/dL — ABNORMAL HIGH (ref 70–99)
Phosphorus: 3.2 mg/dL (ref 2.5–4.6)
Phosphorus: 3.1 mg/dL (ref 2.5–4.6)
Potassium: 4.9 mmol/L (ref 3.5–5.1)
Potassium: 4.9 mmol/L (ref 3.5–5.1)
Sodium: 139 mmol/L (ref 135–145)
Sodium: 136 mmol/L (ref 135–145)

## 2020-04-04 LAB — GLUCOSE, CAPILLARY
Glucose-Capillary: 126 mg/dL — ABNORMAL HIGH (ref 70–99)
Glucose-Capillary: 141 mg/dL — ABNORMAL HIGH (ref 70–99)
Glucose-Capillary: 143 mg/dL — ABNORMAL HIGH (ref 70–99)
Glucose-Capillary: 152 mg/dL — ABNORMAL HIGH (ref 70–99)
Glucose-Capillary: 180 mg/dL — ABNORMAL HIGH (ref 70–99)
Glucose-Capillary: 233 mg/dL — ABNORMAL HIGH (ref 70–99)

## 2020-04-04 LAB — HEPATIC FUNCTION PANEL
ALT: 22 U/L (ref 0–44)
AST: 35 U/L (ref 15–41)
Albumin: 2.2 g/dL — ABNORMAL LOW (ref 3.5–5.0)
Alkaline Phosphatase: 112 U/L (ref 38–126)
Bilirubin, Direct: 0.3 mg/dL — ABNORMAL HIGH (ref 0.0–0.2)
Indirect Bilirubin: 0.6 mg/dL (ref 0.3–0.9)
Total Bilirubin: 0.9 mg/dL (ref 0.3–1.2)
Total Protein: 5.6 g/dL — ABNORMAL LOW (ref 6.5–8.1)

## 2020-04-04 LAB — POCT I-STAT 7, (LYTES, BLD GAS, ICA,H+H)
Acid-Base Excess: 3 mmol/L — ABNORMAL HIGH (ref 0.0–2.0)
Bicarbonate: 28.4 mmol/L — ABNORMAL HIGH (ref 20.0–28.0)
Calcium, Ion: 1.15 mmol/L (ref 1.15–1.40)
HCT: 23 % — ABNORMAL LOW (ref 39.0–52.0)
Hemoglobin: 7.8 g/dL — ABNORMAL LOW (ref 13.0–17.0)
O2 Saturation: 99 %
Patient temperature: 97.6
Potassium: 4.8 mmol/L (ref 3.5–5.1)
Sodium: 137 mmol/L (ref 135–145)
TCO2: 30 mmol/L (ref 22–32)
pCO2 arterial: 47.2 mmHg (ref 32.0–48.0)
pH, Arterial: 7.385 (ref 7.350–7.450)
pO2, Arterial: 149 mmHg — ABNORMAL HIGH (ref 83.0–108.0)

## 2020-04-04 LAB — LACTATE DEHYDROGENASE: LDH: 279 U/L — ABNORMAL HIGH (ref 98–192)

## 2020-04-04 LAB — MAGNESIUM: Magnesium: 2.7 mg/dL — ABNORMAL HIGH (ref 1.7–2.4)

## 2020-04-04 MED ORDER — WARFARIN 0.5 MG HALF TABLET
0.5000 mg | ORAL_TABLET | Freq: Once | ORAL | Status: AC
  Administered 2020-04-04: 0.5 mg
  Filled 2020-04-04: qty 1

## 2020-04-04 NOTE — Progress Notes (Signed)
Patient ID: [name] [name], male   DOB: 09-Jun-1951, 69 y.o.   MRN: [id number] TCTS Evening Rounds:  Hemodynamically stable on epi, 5, milrinone 0.25, NE 4, Vaso 0.03. MAP 70's to 80 CVP 8 LVAD parameters stable Remains on vent, CRRT going. Chest tubes with no output.

## 2020-04-04 NOTE — Progress Notes (Signed)
PULMONARY / CRITICAL CARE MEDICINE   NAME:  [name] [name], MRN:  [id number], DOB:  04-17-1951, LOS: 37 ADMISSION DATE:  02/16/2020, CONSULTATION DATE:  03/27/20 REFERRING MD:  Aundra Dubin, CHIEF COMPLAINT:  Respiratory failure  BRIEF HISTORY:    69 year old man with advanced ischemic cardiomyopathy, prior mitral valve repair, afib presented for redo sternotomy, LVAD implantation, left atrial appendage clipping, and sternal reconstruction on 03/26/2020.  Hospitalization complicated by renal failure, RV dysfunction, persistent delirium, and R MCA stroke.  Respiratory status has been tenuous.  On the morning of 4/20, patient became agonal with low flow on LVAD followed by intubation and two rounds of epi.  PCCM re-consulted.  HISTORY OF PRESENT ILLNESS   69 year old man with advanced ischemic cardiomyopathy, prior mitral valve repair, afib presented for redo sternotomy, LVAD implantation, left atrial appendage clipping, and sternal reconstruction on 04/10/2020.    Postoperative course complicated by persistent delirium, RV dysfunction, renal failure, abnormal INR of unclear etiology.  Started on CRRT 4/13.  Patient less responsive.  Found on CT head 4/15 with acute posterior R MCA stroke.  Neurology following.  Respiratory status has been tenuous post op as well in which PCCM initially consulted for.  Has remained mostly on BIPAP.  On 4/16, on 5 L [place] and saturating well. He is alert and oriented to self today (4/16).  PCCM available on as needed basis.  On the morning of 4/20, patient became agonal with low flow on LVAD followed by intubation and two rounds of epi.  PCCM re-consulted.  SIGNIFICANT PAST MEDICAL HISTORY   Ischemic cardiomyopathy MV prolapse s/p repair in 2004 HTN HLD CKD  SIGNIFICANT EVENTS:  4/8: redo sternotomy, LVAD implantation, left atrial appendage clipping, and sternal reconstruction  4/9: extubated 4/13 CRRT started  4/15 R MCA CVA on [facility] 4/16 off Bipap, on 5L [place] 4/19 CRRT  stopped, respiratory arrest, intubated, chest tube placed for L hemothorax  4/20 L VATS with 2 pleural drains placed   STUDIES:   CT Head 03/27/20 IMPRESSION: Large posterior right MCA infarct, new since prior study.  CT a/p 03/19/2020 IMPRESSION: 1. No acute intra-abdominal or pelvic pathology. No bowel obstruction. Normal appendix. 2. Cirrhosis with small perihepatic ascites. 3. Cholelithiasis. No evidence of acute cholecystitis by CT. 4. Aortic Atherosclerosis (ICD10-I70.0).  4/15 CXR volume overload  Echo 4/10 1. Images are poor quality and cannot comment on global LV function. .  Left ventricular ejection fraction, by estimation, is 20 to 25%. The left  ventricle has severely decreased function. The left ventricle demonstrates  global hypokinesis. There is  moderate concentric left ventricular hypertrophy.  2. Aortic dilatation noted. There is mild dilatation of the ascending  aorta and of the aortic root measuring 41 mm and 37mm respectively.  3. Right ventricular systolic function is severely reduced. The right  ventricular size is moderately enlarged.  4. The aortic valve is tricuspid. Mild aortic valve sclerosis is present,  with no evidence of aortic valve stenosis.   CULTURES:  COVID swab neg on admission MRSA PCR neg 4/7 Blood cultures x 2 4/13 neg Urine culture 4/12 neg  Bcx 4/20>  Respiratory cx 4/20>   ANTIBIOTICS:  Vanc 4/12 - 4/17 Cefepime 4/11 - 4/17 Linezolid 4/20 >  Cefepime 4/20>   LINES/TUBES:  4/8: redo sternotomy, LVAD implantation, left atrial appendage clipping, and sternal reconstruction   RIJ introducer >> R [place] HD catheter >> R radial A line >> out R/L pleural drains  >> out  4/20 ETT >> 4/20 R radial aline >> 4/20 left chest tube, 2 pleural drains >>  CONSULTANTS:  CHF, TCTS, PCCM, Nephro, Neuro, Hematology  SUBJECTIVE:  Patient had watery diarrhea overnight and felxiseal placed. Otherwise no acute events. He remains  intubated and sedated.   CONSTITUTIONAL: BP 93/62   Pulse 89   Temp 97.6 F (36.4 C) (Oral)   Resp 14   Ht 6' (1.829 m)   Wt 105.9 kg   SpO2 99%   BMI 31.66 kg/m   I/O last 3 completed shifts: In: 5297.1 [I.V.:3814.6; NG/GT:1182.5; IV Piggyback:300.1] Out: 3428 [JGOTL:5726; Chest Tube:160]  CVP:  [5 mmHg-24 mmHg] 7 mmHg  Vent Mode: PRVC FiO2 (%):  [40 %] 40 % Set Rate:  [18 bmp-22 bmp] 18 bmp Vt Set:  [203 mL] 620 mL PEEP:  [5 cmH20] 5 cmH20 Plateau Pressure:  [20 cmH20-22 cmH20] 22 cmH20  PHYSICAL EXAM: General: critically-ill appearing male, sedated  HEENT: ETT in place Neuro: does not open eyes to sternal or voice stimulus, does not follow commands  PULM: coarse breath sounds bilaterally, diminished on R base  CV: LVAD hum GI: soft, hypoactive bowel sounds  Extremities: 2+ pitting edema in all 4 extremities  Skin: warm   RESOLVED PROBLEM LIST    ASSESSMENT AND PLAN    # Acute hypoxic respiratory failure secondary to L hemothorax s/p L CT placement and VATS 4/20 -- RT attempted to wean patient this AM but he is mostly taking shallow breaths. Will decrease Fentanyl to 25 mcg. If comfortable on decreased dose, recommend discontinuing and adding Fentanyl pushes to help with the weaning process.  -- CXR this AM stable from yesterday - vascular congestion with L upper lobe opacities and L pleural effusion vs atelectasis, cont' CVHD for fluid removal  -- May benefit from bronchoscopy once he is more stable  -- PAD protocol  -- VAP precautions in place   -- L Chest tube and 2 pleural drain in place with minimal bloody drainage   -- Follow up repeat respiratory and Bcx, collected 4/20, negative to date  -- Palliative care involved and updating family  # Cardiogenic shock   # Suspected PEA arrest  # ICM s/p LVAD placement 4/8 for destination therapy  -- No obvious tamponade physiology on bedside TTE by Dr. Haroldine Laws  -- On epi, norepi, and vaso gtt. Recommend weaning  epi off, can uptitrate norepi to help with this. Will defer final management to TCTS  -- Co-ox 79 this AM  -- Trend H/H -- Management per TCT/CHF team -- On ASA, resuming Coumadin today, INR 2.4  -- INR goal 2-3    # Right MCA cardioembolic stroke, suspected secondary to Afib  # Seizure like-activity  -- Neuro signed off 4/17. Recommended continuing AC with ASA and warfarin  -- Patient noted to have rhythmic movements of bilateral UE 4/21. EEG showed moderate to severe diffuse encephalopathy, but no seizures or epileptiform discharges.   # Chronic afiblled -- SSI, lantus  -- S/p maze and LAA clipping 4/8 -- Resuming Coumadin today  -- On amiodarone 200 mg BID   # Acute renal failure on CKD  -- Nephrology following  -- On CRRT 4/19   -- Remains anuric -- Trend renal panel/ mag daily   # DM type II uncontrolled  -- SSI   # GOC:  -- Palliative care following     Best Practice / Goals of Care / Disposition.   Diet: TF  Pain/Anxiety/Delirium protocol (if indicated):  fentanyl gtt VAP protocol (if indicated): yes DVT prophylaxis: warfarin, SCD  GI prophylaxis: PPI Glucose control: SSI Mobility: BR Code Status: partial- no compression given LVAD Family Communication: per primary Disposition: ICU  LABS  Glucose Recent Labs  Lab 04/03/20 0810 04/03/20 1207 04/03/20 1605 04/03/20 1953 04/04/20 0019 04/04/20 0334  GLUCAP 190* 209* 252* 263* 233* 180*    BMET Recent Labs  Lab 04/03/20 0329 04/03/20 0329 04/03/20 1607 04/04/20 0332 04/04/20 0337  NA 136   < > 135 139 137  K 5.3*   < > 5.3* 4.9 4.8  CL 102  --  102 103  --   CO2 25  --  24 25  --   BUN 37*  --  39* 35*  --   CREATININE 2.53*  --  2.54* 2.25*  --   GLUCOSE 255*  --  284* 188*  --    < > = values in this interval not displayed.    Liver Enzymes Recent Labs  Lab 04/02/20 0429 04/02/20 1620 04/03/20 0329 04/03/20 1607 04/04/20 0332  AST 35  --  37  --  35  ALT 20  --  22  --  22   ALKPHOS 78  --  119  --  112  BILITOT 1.4*  --  0.8  --  0.9  ALBUMIN 2.6*  2.6*   < > 2.3*  2.2* 2.2* 2.2*  2.1*   < > = values in this interval not displayed.    Electrolytes Recent Labs  Lab 04/02/20 0429 04/02/20 1620 04/03/20 0329 04/03/20 1607 04/04/20 0332  CALCIUM 7.9*   < > 7.9* 7.8* 8.2*  MG 2.5*  --  2.7*  --  2.7*  PHOS 5.2*   < > 4.0 3.7 3.2   < > = values in this interval not displayed.    CBC Recent Labs  Lab 04/02/20 0429 04/03/20 0318 04/03/20 0329 04/04/20 0332 04/04/20 0337  WBC 26.9*  --  18.5* 13.1*  --   HGB 8.8*   < > 7.7* 7.7* 7.8*  HCT 27.1*   < > 24.4* 24.7* 23.0*  PLT 129*  --  108* 124*  --    < > = values in this interval not displayed.    ABG Recent Labs  Lab 03/15/2020 1743 04/03/20 0318 04/04/20 0337  PHART 7.349* 7.382 7.385  PCO2ART 47.3 46.6 47.2  PO2ART 203* 108 149*    Coag's Recent Labs  Lab 04/02/20 0429 04/03/20 0329 04/04/20 0332  INR 2.3* 3.4* 2.4*    Sepsis Markers No results for input(s): LATICACIDVEN, PROCALCITON, O2SATVEN in the last 168 hours.  Cardiac Enzymes No results for input(s): TROPONINI, PROBNP in the last 168 hours.    Please refer to attending attestation for further details.

## 2020-04-04 NOTE — Progress Notes (Signed)
3 Days Post-Op Procedure(s) (LRB): VIDEO ASSISTED THORACOSCOPY (Left) TRANSESOPHAGEAL ECHOCARDIOGRAM (TEE) (N/A) Evacuation Hematoma (Left) Subjective: Sedated on vent, fentanyl down to low-dose Minimal drainage from chest tubes, hemoglobin stable 7.8 with coox greater than 70%.  Chest x-ray clear with some left basilar atelectasis.  No pleural effusion.  Objective: Vital signs in last 24 hours: Temp:  [97.3 F (36.3 C)-98.1 F (36.7 C)] 97.6 F (36.4 C) (04/23 0800) Pulse Rate:  [68-111] 86 (04/23 0800) Cardiac Rhythm: Atrial fibrillation (04/23 0800) Resp:  [0-24] 19 (04/23 0800) BP: (85-141)/(62-124) 116/79 (04/23 0800) SpO2:  [96 %-100 %] 99 % (04/23 0800) Arterial Line BP: (87-174)/(56-105) 110/69 (04/23 0800) FiO2 (%):  [40 %] 40 % (04/23 0423) Weight:  [105.9 kg] 105.9 kg (04/23 0300)  Hemodynamic parameters for last 24 hours: CVP:  [5 mmHg-24 mmHg] 9 mmHg  Intake/Output from previous day: 04/22 0701 - 04/23 0700 In: 3742.6 [I.V.:2262.6; NG/GT:1280; IV Piggyback:200.1] Out: 4567 [Emesis/NG output:80; Stool:350; Chest Tube:40] Intake/Output this shift: Total I/O In: 127.9 [I.V.:77.9; NG/GT:50] Out: 169 [Other:169]  Exam Sedated but responds to stimulus Lungs with scattered rhonchi Sternal and thoracotomy incisions clean and dry Normal VAD hum 2+ peripheral edema  Lab Results: Recent Labs    04/03/20 0329 04/03/20 0329 04/04/20 0332 04/04/20 0337  WBC 18.5*  --  13.1*  --   HGB 7.7*   < > 7.7* 7.8*  HCT 24.4*   < > 24.7* 23.0*  PLT 108*  --  124*  --    < > = values in this interval not displayed.   BMET:  Recent Labs    04/03/20 1607 04/03/20 1607 04/04/20 0332 04/04/20 0337  NA 135   < > 139 137  K 5.3*   < > 4.9 4.8  CL 102  --  103  --   CO2 24  --  25  --   GLUCOSE 284*  --  188*  --   BUN 39*  --  35*  --   CREATININE 2.54*  --  2.25*  --   CALCIUM 7.8*  --  8.2*  --    < > = values in this interval not displayed.    PT/INR:  Recent  Labs    04/04/20 0332  LABPROT 25.9*  INR 2.4*   ABG    Component Value Date/Time   PHART 7.385 04/04/2020 0337   HCO3 28.4 (H) 04/04/2020 0337   TCO2 30 04/04/2020 0337   ACIDBASEDEF 2.0 03/21/2020 1405   O2SAT 99.0 04/04/2020 0337   CBG (last 3)  Recent Labs    04/04/20 0019 04/04/20 0334 04/04/20 0800  GLUCAP 233* 180* 126*    Assessment/Plan: S/P Procedure(s) (LRB): VIDEO ASSISTED THORACOSCOPY (Left) TRANSESOPHAGEAL ECHOCARDIOGRAM (TEE) (N/A) Evacuation Hematoma (Left) Improved pulmonary status, attempt pressure support weaning if possible Place left pleural tubes to waterseal and probably remove the anterior tube tomorrow with minimal output Resume low-dose Coumadin, discussed with pharmacy Continue CVVH    LOS: 23 days    [name] [name] 04/04/2020

## 2020-04-04 NOTE — Progress Notes (Signed)
Physical Therapy Treatment Patient Details Name: [name] [name] MRN: [id number] DOB: Dec 18, 1950 Today's Date: 04/04/2020    History of Present Illness 69 yo male with history of CAD with prior stenting of the LAD, Diagonal and OM in an outside hospital, ischemic cardiomyopathy with ICD in place, chronic systolic CHF, MVP s/p mitral valve repair, permanent atrial fib on chronic anticoagulation, HTN, HLD, DM, depression admitted to [facility] with progressive weakness, fatigue, dyspnea and chest pressure. Troponin elevated at 0.06. He was transferred to [facility] for cardiac cath, showing severe 3 vessel disease. Plan for CABG but pt with AKI. Pt underwent multiple tooth extraction on 4/6 and LVAD placement on 4/8. Postop day 7 patient was noted to be agitated the CT head was obtained showing a large posterior right MCA infarct. Pt with arrest on 4/20 and reintubated. Pt on CRRT.    PT Comments    Pt tolerated performing bed level exercises with assist. Continue to follow for strengthening and mobility progression.    Follow Up Recommendations  CIR;Supervision/Assistance - 24 hour     Equipment Recommendations  Other (comment)(TBD)    Recommendations for Other Services       Precautions / Restrictions Precautions Precautions: Fall;Sternal Precaution Comments: LVAD, CRRT, cortrak, bil mittens    Mobility  Bed Mobility                  Transfers                    Ambulation/Gait                 Stairs             Wheelchair Mobility    Modified Rankin (Stroke Patients Only)       Balance                                            Cognition Arousal/Alertness: (with stimulation) Behavior During Therapy: Flat affect Overall Cognitive Status: Difficult to assess                                 General Comments: Pt following 1 step commands with incr time and inconsistently.      Exercises General  Exercises - Upper Extremity Shoulder Flexion: AAROM;Both;10 reps;Supine Elbow Flexion: AAROM;Both;10 reps;Supine General Exercises - Lower Extremity Ankle Circles/Pumps: PROM;Both;10 reps;Supine Short Arc Quad: AAROM;Both;10 reps;Supine Heel Slides: AAROM;10 reps;Supine;Both Hip ABduction/ADduction: AAROM;10 reps;Supine;Both Straight Leg Raises: AAROM;Both;10 reps;Supine    General Comments        Pertinent Vitals/Pain Pain Assessment: Faces Faces Pain Scale: No hurt    Home Living                      Prior Function            PT Goals (current goals can now be found in the care plan section) Progress towards PT goals: Not progressing toward goals - comment    Frequency    Min 3X/week      PT Plan Current plan remains appropriate    Co-evaluation              AM-PAC PT "6 Clicks" Mobility   Outcome Measure  Help needed turning from your back to your side while in a flat  bed without using bedrails?: Total Help needed moving from lying on your back to sitting on the side of a flat bed without using bedrails?: Total Help needed moving to and from a bed to a chair (including a wheelchair)?: Total Help needed standing up from a chair using your arms (e.g., wheelchair or bedside chair)?: Total Help needed to walk in hospital room?: Total Help needed climbing 3-5 steps with a railing? : Total 6 Click Score: 6    End of Session   Activity Tolerance: Patient tolerated treatment well Patient left: in bed;with call bell/phone within reach   PT Visit Diagnosis: Unsteadiness on feet (R26.81);Muscle weakness (generalized) (M62.81);Difficulty in walking, not elsewhere classified (R26.2);Other symptoms and signs involving the nervous system (R29.898);Other abnormalities of gait and mobility (R26.89)     Time: 9292-4462 PT Time Calculation (min) (ACUTE ONLY): 14 min  Charges:  $Therapeutic Exercise: 8-22 mins                     [place] Pager [phone] Office [place] 04/04/2020, 2:41 PM

## 2020-04-04 NOTE — Progress Notes (Signed)
LVAD Coordinator Rounding Note:  HM III LVAD implanted on 03/27/2020 with MAZE procedure + LAA clipping  by Dr Orvan Seen under destination therapy criteria due to advanced age. Primary Heart Failure Cardiologist is Dr. Haroldine Laws.   Pt lying in bed, sleeping but does arouse to stimulus. Nodding/shaking head to answer questions. Moving all extremities to command per bedside RN.   Pt remains on CVVH; nurse reports she is pulling @ 75 cc/hr.  Vital signs: Temp: 97.6 HR: 91 - afib A line: 114/66 (78) O2 Sat: 98% on vent 40% FIO2, 5 peep Wt: 253.3>262.3>260.8>259.9>237.8>220>223>222.8>236.9>235>233.5 lbs   LVAD interrogation reveals:  Speed: 5300 Flow: 4.4 Power: 3.8w  PI: 3.4  Alarms: none  Events: none Hematocrit: 25  Fixed speed: 5300 Low speed limit: 5000  CRRT: pulling 75 cc/hr  Drive Line: VAD dressing C/D/I; anchor intact and accurately applied. Existing VAD dressing removed using sterile technique. Drive line exit site cleaned with Chlora prep applicators x 2, allowed to dry, and gauze dressing with silver strip re-applied. Exit site with suture intact, the velour is fully implanted at exit site. Slight redness, no tenderness, drainage, foul odor or rash noted. Drive line anchor intact. Will continue M/W/F day dressing changes per nurse champion or VAD coordinator.  Next dressing change due 04/07/20.     Labs:  LDH trend: 360>513>482>467>453>454>474>428>415>422>349>305>279  INR trend: 1.3>6.2>9>4.4>6.2>2.2>2.4>2.2>2.0>2.2>2.3>3.4>2.4  CR: 3.57>3.98>3.18>2.44>2.41>2.0>2.1>1.96>3.29>4.3>3.6>2.53>2.25  Anticoagulation Plan: -INR Goal: 2.0-2.5 -ASA Dose: 81 mg   Respiratory: - extubated 4/9/2 - re-intubated 03/31/2020 s/p left hemothorax with cardiac arrest  Nitric Oxide: off 03/23/20  Blood Products:  Intraop: 6 FFP  DDAVP  420 cell saver  04/07/2020>>4 FFP 03/21/20>> 1 RBC 03/22/20>>1 RBC  Post-op: 03/14/2020>>3 PC's, 2 FFP  Intra-op: 04/08/2020>>2 PCs      2 FFP  Cultures:   -03/25/20- blood cultures>>negative - 03/20/2020-blood cultures>>pending  Device: -St [name] -Therapies: VF 194 - therapy on  Drips: - Milrinone 0.25 mcg/kg/min - Epi 6 mcg/min  - Vaso 0.03 units/min - Levo 4 mcg/min - Fentanyl 25 mcg/hr - Amiodarone 30 mg/hr - Tube feed: 50 mL/hr  Adverse Events on VAD: >>03/25/20 CVVH Started >>04/06/2020 left hemothorax; cardiac arrest  Pt Education:  1. Education inappropriate at this time, pt is unable to respond; remains intubated and sedated.   2. No family at bedside.  Plan/Recommendations:  1. Call VAD coordinator for any equipment or drive line issues.  2. Dressing changes M/W/F per nurse champion or VAD coordinator.   Emerson Monte RN [place] Coordinator  Office: [phone]  24/7 Pager: [phone]

## 2020-04-04 NOTE — Progress Notes (Signed)
ANTICOAGULATION CONSULT NOTE  Pharmacy Consult for warfarin Indication: post op LVAD  / Aflutter  Allergies  Allergen Reactions  . Liraglutide Nausea And Vomiting  . Lisinopril Cough    Patient Measurements: Height: 6' (182.9 cm) Weight: 105.9 kg (233 lb 7.5 oz) IBW/kg (Calculated) : 77.6 Heparin Dosing Weight: 101.2 kg  Vital Signs: Temp: 97.6 F (36.4 C) (04/23 0800) Temp Source: Axillary (04/23 0800) BP: 108/71 (04/23 0911) Pulse Rate: 96 (04/23 0911)  Labs: Recent Labs    04/02/20 0429 04/02/20 1620 04/03/20 0329 04/03/20 0329 04/03/20 1607 04/04/20 0332 04/04/20 0337  HGB 8.8*   < > 7.7*   < >  --  7.7* 7.8*  HCT 27.1*   < > 24.4*  --   --  24.7* 23.0*  PLT 129*  --  108*  --   --  124*  --   LABPROT 25.4*  --  34.3*  --   --  25.9*  --   INR 2.3*  --  3.4*  --   --  2.4*  --   CREATININE 3.61*   < > 2.53*  --  2.54* 2.25*  --    < > = values in this interval not displayed.    Estimated Creatinine Clearance: 38.4 mL/min (A) (by C-G formula based on SCr of 2.25 mg/dL (H)).   Medical History: Past Medical History:  Diagnosis Date  . Anxiety   . Arthritis   . CAD (coronary artery disease)    a.  1993 s/p MI - [employer] [facility];  b. s/p BMS to LAD '00;  c. PTCA 2nd diagonal 2010;  d. 02/18/12 Cath: moderate nonobs dzs - med rx;  e.  01/2015 Cath: LM nl, LAD 40-52m ISR, 70-53m/d, d1 90p (3.0x16 Synergy DES), D2 50-60, LCX nl, OM1 50p, 50m (2.5x12 Synergy DES), RCA nl, EF 30-35%.  . Chronic combined systolic and diastolic CHF (congestive heart failure) ([place])    a. 12/2014 Echo: EF 30-35%, Gr2 DD, mod MR, sev dil LA.  . CKD (chronic kidney disease), stage III   . Depression   . ED (erectile dysfunction)   . GERD (gastroesophageal reflux disease)   . Hyperlipidemia   . Hypertension   . Ischemic cardiomyopathy    a. s/p St. [name] (Atlas) ICD implanted in [place] 2007;  b. 12/2014 Echo: Ef 30-35%.  . MVP (mitral valve prolapse)    a. s/p MV annuloplasty at [facility] 2004.  Marland Kitchen Persistent atrial fibrillation ([place])    a. noted on ICD interrogation '10 - not previously on [place] - CHA2DS2VASc = 5.  . Type II diabetes mellitus ([place])    uncontrolled    Medications:  Medications Prior to Admission  Medication Sig Dispense Refill Last Dose  . ALPRAZolam (XANAX) 0.25 MG tablet Take 0.25 mg by mouth 3 (three) times daily as needed for anxiety.    unknown  . amiodarone (PACERONE) 200 MG tablet Take 200 mg by mouth daily.    03/11/2020 at Unknown time  . apixaban (ELIQUIS) 5 MG TABS tablet Take 1 tablet (5 mg total) by mouth 2 (two) times daily. 60 tablet 1 03/11/2020 at 0600  . atorvastatin (LIPITOR) 40 MG tablet Take 1 tablet (40 mg total) by mouth daily at 6 PM. 30 tablet 0 03/10/2020 at Unknown time  . citalopram (CELEXA) 20 MG tablet Take 20 mg by mouth daily.    03/11/2020 at Unknown time  . furosemide (LASIX) 40 MG tablet Take 40 mg by mouth daily.  03/11/2020 at Unknown time  . insulin aspart (NOVOLOG FLEXPEN) 100 UNIT/ML FlexPen Inject 6 Units into the skin 3 (three) times daily with meals. 15 mL 0 03/11/2020 at Unknown time  . Insulin Glargine (BASAGLAR KWIKPEN) 100 UNIT/ML SOPN Inject 0.5 mLs (50 Units total) into the skin daily. 15 mL 1 03/11/2020 at Unknown time  . metoprolol succinate (TOPROL-XL) 100 MG 24 hr tablet Take 1 tablet (100 mg total) by mouth daily. 90 tablet 3 03/11/2020 at 0600  . nitroGLYCERIN (NITROSTAT) 0.4 MG SL tablet Place 1 tablet (0.4 mg total) under the tongue every 5 (five) minutes x 3 doses as needed for chest pain. 10 tablet 0 unknown  . pantoprazole (PROTONIX) 40 MG tablet Take 1 tablet (40 mg total) by mouth daily. 30 tablet 1 03/11/2020 at Unknown time  . potassium chloride SA (KLOR-CON) 20 MEQ tablet Take 20 mEq by mouth daily.    03/11/2020 at Unknown time  . tamsulosin (FLOMAX) 0.4 MG CAPS capsule Take 1 capsule (0.4 mg total) by mouth daily. 30 capsule 0 03/11/2020 at Unknown time  . ALPRAZolam (XANAX) 1 MG tablet Take 1  tablet (1 mg total) by mouth 2 (two) times daily as needed for anxiety. (Patient not taking: Reported on 02/12/2020) 10 tablet 0 Not Taking at Unknown time    Assessment: 2 YOM with atrial fibrillation on Eliquis PTA s/p R & L heart cath found to have 3v disease s/p  LVAD  Implant 4/8. Warfarin initiated on 4/9 after discussing with MD.  CT on 4/15 now found to have large right MCA stroke. INR had been elevated up to 9.0 postop, he received 1 dose of vitamin K 1 mg IV on 4/13 and again on 4/15 with INR still elevated at 6.2.   Warfarin resumed cautiously on 4/17. Held doses since 4/20 since he went to the OR for VATS.   INR is now therapeutic at 2.4. Hgb stable 7.7. Pltc up to 124. LDH down 279. No bleeding noted. CT with minimal output.   Goal of Therapy:  INR 2-2.5 Monitor platelets by anticoagulation protocol: Yes   Plan:  -Warfarin 0.5 mg x1 today -Daily INR and CBC  Vertis Kelch, PharmD, [facility] PGY2 Cardiology Pharmacy Resident Phone [phone] 04/04/2020       9:13 AM  Please check [email or web address] for unit-specific pharmacist phone numbers

## 2020-04-04 NOTE — Progress Notes (Addendum)
Patient ID: [name] [name], male   DOB: 1951-05-17, 69 y.o.   MRN: [id number]    Advanced Heart Failure Rounding Note   Subjective:    - S/p HM3 VAD 4/8 w MAZE procedure + LAA clipping. - Extubated 4/9 - 4/12 LVAD speed increased to 5400 --> echo moderate-severe RV dysfunction, septum mildly left shifted, trivial pericardial effusion.  LVAD speed cut back to 5300.  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  - 4/20 developed hemothorax requiring emergent CT placement and eventual VATS - 4/21 EEG done given concerns for posturing and c/w moderate to severe diffuse encephalopathy. No seizures or epileptiform discharges were seen  - 04/03/20 Pump Stop --> controller change out. CT of head unchanged R MCA CVA   Remains on Epi 6,  NE 4,  VP 0.03  + milrinone 0.25. Co-ox 79%   Remains on CVVHD, pulling 50/hr.  CVP 9   WBC 34.6 -> 26.9->18->13k.   INR 2.4 . Hgb 7.7.   LDH 467 => 453 => 454 => 474 => 428=>415 => 349=> 305 =>279   Intubated. Unresponsive.   VAD Interrogation  Flow 4.4, Speed 5350, Power 4, Pulse Index 3.4   Pump Stop 1318 Had controller change out.   Objective:   Weight Range:  Vital Signs:   Temp:  [97.3 F (36.3 C)-98.1 F (36.7 C)] 97.6 F (36.4 C) (04/23 0400) Pulse Rate:  [68-121] 95 (04/23 0700) Resp:  [0-24] 18 (04/23 0700) BP: (85-141)/(53-124) 96/77 (04/23 0700) SpO2:  [95 %-100 %] 99 % (04/23 0700) Arterial Line BP: (83-174)/(56-105) 95/62 (04/23 0700) FiO2 (%):  [40 %] 40 % (04/23 0423) Weight:  [105.9 kg] 105.9 kg (04/23 0300) Last BM Date: 03/31/20  Weight change: Filed Weights   04/02/20 0500 04/03/20 0500 04/04/20 0300  Weight: 107.5 kg 106.7 kg 105.9 kg    Intake/Output:   Intake/Output Summary (Last 24 hours) at 04/04/2020 0731 Last data filed at 04/04/2020 0700 Gross per 24 hour  Intake 3742.63 ml  Output 4567 ml  Net -824.37 ml       Physical Exam: CVP 9  GENERAL: Intubated/sedated   HEENT: ETT  NECK:  Supple, JVP 8-9 .  2+ bilaterally, no bruits.  No lymphadenopathy or thyromegaly appreciated.  RIJ introducer. R subclavian HD catheter CARDIAC:  Mechanical heart sounds with LVAD hum present.  LUNGS:  Clear to auscultation bilaterally. CT on left- no output ABDOMEN:  Soft, round, nontender, positive bowel sounds x4.     LVAD exit site:  Dressing dry and intact.  No erythema or drainage.  Stabilization device present and accurately applied.  Driveline dressing is being changed daily per sterile technique. EXTREMITIES:  Warm and dry, no cyanosis, clubbing, rash or edema  NEUROLOGIC: Sedated not following commands.   Telemetry: A fib 100-120s    Labs: Basic Metabolic Panel: Recent Labs  Lab 03/21/2020 0408 03/31/2020 0410 03/29/2020 0512 04/09/2020 0600 04/02/20 0429 04/02/20 0429 04/02/20 1620 04/02/20 1620 04/03/20 0318 04/03/20 0329 04/03/20 1607 04/04/20 0332 04/04/20 0337  NA   < >  --  132*   < > 138   < > 137   < > 134* 136 135 139 137  K   < >  --  5.9*   < > 5.5*   < > 5.5*   < > 5.1 5.3* 5.3* 4.9 4.8  CL   < >  --  96*   < > 103  --  101  --   --  102 102 103  --   CO2   < >  --  21*   < > 24  --  24  --   --  25 24 25   --   GLUCOSE   < >  --  301*   < > 212*  --  207*  --   --  255* 284* 188*  --   BUN   < >  --  42*   < > 45*  --  38*  --   --  37* 39* 35*  --   CREATININE   < >  --  3.29*   < > 3.61*  --  2.86*  --   --  2.53* 2.54* 2.25*  --   CALCIUM   < >  --  8.6*   < > 7.9*   < > 7.9*   < >  --  7.9* 7.8* 8.2*  --   MG  --  2.4 2.9*  --  2.5*  --   --   --   --  2.7*  --  2.7*  --   PHOS   < >  --  5.7*   < > 5.2*  --  4.7*  --   --  4.0 3.7 3.2  --    < > = values in this interval not displayed.    Liver Function Tests: Recent Labs  Lab 03/30/20 0324 03/30/20 0730 03/31/20 0339 03/31/20 1649 04/02/20 0429 04/02/20 1620 04/03/20 0329 04/03/20 1607 04/04/20 0332  AST 27  --  30  --  35  --  37  --  35  ALT 24  --  21  --  20  --  22  --  22  ALKPHOS 119   --  116  --  78  --  119  --  112  BILITOT 1.0  --  0.9  --  1.4*  --  0.8  --  0.9  PROT 5.6*  --  5.5*  --  5.2*  --  5.2*  --  5.6*  ALBUMIN 2.3*   < > 2.2*  2.2*   < > 2.6*  2.6* 2.6* 2.3*  2.2* 2.2* 2.2*  2.1*   < > = values in this interval not displayed.   No results for input(s): LIPASE, AMYLASE in the last 168 hours. No results for input(s): AMMONIA in the last 168 hours.  CBC: Recent Labs  Lab 03/29/20 0318 03/29/20 0330 03/30/20 0324 03/30/20 0340 03/31/20 0339 03/31/20 0425 04/04/2020 0410 04/06/2020 0512 04/08/2020 1042 04/10/2020 1405 03/14/2020 1726 04/11/2020 1726 04/02/20 0429 04/03/20 0318 04/03/20 0329 04/04/20 0332 04/04/20 0337  WBC 13.8*   < > 13.7*   < > 13.3*   < > 19.5*   < > 45.4*  --  34.6*  --  26.9*  --  18.5* 13.1*  --   NEUTROABS 11.8*  --  11.6*  --  11.4*  --  16.9*  --   --   --   --   --  24.0*  --   --   --   --   HGB 10.2*   < > 9.5*   < > 9.3*   < > 8.5*   < > 9.4*   < > 9.4*   < > 8.8* 9.5* 7.7* 7.7* 7.8*  HCT 31.5*   < > 29.4*   < > 28.7*   < > 27.0*   < >  29.0*   < > 29.0*   < > 27.1* 28.0* 24.4* 24.7* 23.0*  MCV 92.4   < > 90.5   < > 92.9   < > 92.5   < > 91.2  --  88.1  --  87.7  --  92.1 94.3  --   PLT 152   < > 134*   < > 134*   < > 153   < > 170  --  131*  --  129*  --  108* 124*  --    < > = values in this interval not displayed.    Cardiac Enzymes: No results for input(s): CKTOTAL, CKMB, CKMBINDEX, TROPONINI in the last 168 hours.  BNP: BNP (last 3 results) Recent Labs    03/21/20 0209 03/26/20 2329 04/02/20 2342  BNP 315.9* 425.7* 332.7*    ProBNP (last 3 results) No results for input(s): PROBNP in the last 8760 hours.    Other results:  Imaging: EEG  Result Date: 04/02/2020 Lora Havens, MD     04/02/2020  2:16 PM Patient Name: [name] [name] MRN: [id number] Epilepsy Attending: Lora Havens Referring Physician/Provider: Dr Glori Bickers Date: 04/02/2020 Duration: 24.48 mins Patient history: 69 yo M  with Large posterior right MCA infarct and was noted to have posturing. EEG to evaluate for seizure. Level of alertness: comatose AEDs during EEG study: None Technical aspects: This EEG study was done with scalp electrodes positioned according to the 10-20 International system of electrode placement. Electrical activity was acquired at a sampling rate of 500Hz  and reviewed with a high frequency filter of 70Hz  and a low frequency filter of 1Hz . EEG data were recorded continuously and digitally stored. DESCRIPTION:  EEG showed continuous generalized polymorphic 3-6hz  theta-delta slowing. Hyperventilation and photic stimulation were not performed. ABNORMALITY - Continuous slow, generalized IMPRESSION: This study is suggestive of moderate to severe diffuse encephalopathy, non specific to etiology. No seizures or epileptiform discharges were seen throughout the recording. Priyanka Barbra Sarks   CT HEAD WO CONTRAST  Result Date: 04/03/2020 CLINICAL DATA:  Stroke, follow-up. EXAM: CT HEAD WITHOUT CONTRAST TECHNIQUE: Contiguous axial images were obtained from the base of the skull through the vertex without intravenous contrast. COMPARISON:  Prior head CT examinations 03/27/2020 and earlier FINDINGS: Brain: Again demonstrated is a large right MCA territory subacute ischemic infarct centered within the right temporoparietal lobe and also involving portions of the sylvian fissure posteriorly. The infarct has not significantly changed in extent as compared to prior examination 03/27/2020. No evidence of hemorrhagic conversion. There is no significant mass effect. No midline shift. No evidence of internal mass. No extra-axial fluid collection. Stable, mild generalized parenchymal atrophy. Vascular: No definite hyperdense vessel. Atherosclerotic calcifications. Skull: Normal. Negative for fracture or focal lesion. Sinuses/Orbits: Visualized orbits demonstrate no acute abnormality. Mild scattered paranasal sinus mucosal  thickening. Partially visualized support tubes. No significant mastoid effusion. 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 The infarct has not significantly changed in extent as compared to prior exam 03/27/2020. No significant mass effect. No hemorrhagic conversion. No interval intracranial abnormality identified. Electronically Signed   By: Kellie Simmering DO   On: 04/03/2020 13:55   DG Chest Port 1 View  Result Date: 04/03/2020 CLINICAL DATA:  Left ventricular assist device. EXAM: PORTABLE CHEST 1 VIEW COMPARISON:  04/02/2020 FINDINGS: The endotracheal tube, NG tube, feeding tube, right IJ Cordis, right subclavian central  venous catheter and left-sided chest tubes are stable. Left ventricular assist device in stable position without complicating features. Stable cardiac enlargement, vascular congestion, pulmonary edema and left retrocardiac density are unchanged. No pneumothorax. IMPRESSION: 1. Stable support apparatus. 2. Persistent cardiac enlargement, vascular congestion, pulmonary edema and left retrocardiac density. Electronically Signed   By: Marijo Sanes M.D.   On: 04/03/2020 06:44   DG Abd Portable 1V  Result Date: 04/02/2020 CLINICAL DATA:  Feeding tube placement. EXAM: PORTABLE ABDOMEN - 1 VIEW COMPARISON:  None. FINDINGS: The bowel gas pattern is normal. Distal tip of feeding tube is seen in expected position proximal duodenum. No radio-opaque calculi or other significant radiographic abnormality are seen. IMPRESSION: Distal tip of feeding tube seen in expected position proximal duodenum. Electronically Signed   By: Marijo Conception M.D.   On: 04/02/2020 11:54     Medications:     Scheduled Medications: . sodium chloride   Intravenous Once  . aspirin  81 mg Per Tube Daily  . atorvastatin  80 mg Per Tube q1800  . B-complex with vitamin C  1 tablet Per Tube  Daily  . bisacodyl  10 mg Oral Daily   Or  . bisacodyl  10 mg Rectal Daily  . chlorhexidine gluconate (MEDLINE KIT)  15 mL Mouth Rinse BID  . Chlorhexidine Gluconate Cloth  6 each Topical Daily  . docusate  200 mg Per Tube Daily  . feeding supplement (PRO-STAT SUGAR FREE 64)  30 mL Per Tube QID  . influenza vaccine adjuvanted  0.5 mL Intramuscular Tomorrow-1000  . insulin aspart  0-24 Units Subcutaneous Q4H  . insulin aspart  6 Units Subcutaneous Q4H  . insulin glargine  24 Units Subcutaneous BID  . linezolid  600 mg Per Tube Q12H  . mouth rinse  15 mL Mouth Rinse Q2H  . melatonin  3 mg Per Tube QHS  . metoCLOPramide (REGLAN) injection  5 mg Intravenous Q8H  . pantoprazole (PROTONIX) IV  40 mg Intravenous Daily  . polyethylene glycol  17 g Per Tube Daily  . QUEtiapine  25 mg Per Tube QHS  . sildenafil  20 mg Per Tube TID  . sodium chloride flush  10-40 mL Intracatheter Q12H  . Warfarin - Pharmacist Dosing Inpatient   Does not apply q1600    Infusions: .  prismasol BGK 4/2.5 500 mL/hr at 04/04/20 0235  . sodium chloride Stopped (03/22/20 2011)  . sodium chloride Stopped (03/27/20 1523)  . sodium chloride    . sodium chloride 10 mL/hr at 04/02/20 1055  . sodium chloride    . amiodarone 30 mg/hr (04/04/20 0700)  . ceFEPime (MAXIPIME) IV Stopped (04/03/20 2215)  . dexmedetomidine Stopped (04/02/20 0617)  . epinephrine 6 mcg/min (04/04/20 0700)  . feeding supplement (VITAL 1.5 CAL) 50 mL/hr at 04/03/20 2000  . fentaNYL infusion INTRAVENOUS 50 mcg/hr (04/04/20 0700)  . lactated ringers Stopped (03/29/20 0748)  . milrinone 0.25 mcg/kg/min (04/04/20 0700)  . norepinephrine (LEVOPHED) Adult infusion 4 mcg/min (04/04/20 0700)  . prismasol BGK 0/2.5 300 mL/hr at 04/04/20 0247  . prismasol BGK 4/2.5 1,500 mL/hr at 04/04/20 0600  . vasopressin (PITRESSIN) infusion - *FOR SHOCK* 0.03 Units/min (04/04/20 0700)    PRN Medications: sodium chloride, acetaminophen, dextrose, fentaNYL  (SUBLIMAZE) injection, fentaNYL (SUBLIMAZE) injection, heparin, heparin, hydrALAZINE, HYDROcodone-acetaminophen, levalbuterol, ondansetron (ZOFRAN) IV, ondansetron (ZOFRAN) IV, oxyCODONE, sodium chloride, sodium chloride flush, sorbitol, traMADol   Assessment/Plan:   1. Acute on chronic systolic HF -> cardiogenic shock-> S/p HM3 LVAD -  Echo 2016 EF 30-35% - Echo 1/21 EF 25% - Admitted with NYHA IV symptoms and AKI with attempts at diuresis.  - Echo this admit: EF 10-15% moderate RV dysfunction - R/LHC cath 3/21 with severe 3v CAD and low output with CI 1.7.  - Swan placed. Initial co-ox 39%. Initial PAPi 1.5. Started on milrinone 0.25. - Initially planned for CABG but given need for re-do sternotomy, relatively poor targets and longstanding low EF, VAD felt to be better option  - S/p HM3 VAD 4/8 - Speed turned up on 4/11 to  5400, but looking at 4/10 echo there is significant RV dysfunction with leftward septal shift so speed decreased back to 5300 rpm.  - Developed hemothorax on 4/20 requiring emergent chest tube and VATS - On NE 4, Epi 6 VP 0.03 + Milrinone 0.25. Co-ox 75% - Wean pressors as tolerated - remains on vent unresponsive.  -WBC trending down. Remains on antibiotics.   2. VAD - VAD interrogated personally.  - Had Pump Stop 04/03/20. Controller changed out with old controller sent back to [facility] for evaluation.  - on aspirin 81 mg daily + coumadin.  - LDH stable at 279  - INR  2.4  - Driveline ok    3. CAD with unstable angina - s/p previous PCI - cath 02/15/2020 with severe 3v CAD - now s/p VAD on 03/31/2020    4. AKI on CKD 3a - Nephrology consulted. Started CVVHD 4/13  - Renal US 4/5 unremarkable.  - CVP ~9 .   Run CVVHD -50. Down another 2 pounds.  - Hopefully renal function will recover though likely set back by recent events  5. Permanent AF - now s/p MAZE + LAA Clipping 4/8   - Continue IV amio while on high-dose pressors - Warfarin as above.  6. MVP s/p MV  repair - On admit with recurrent severe MR on echo and with huge v-waves on PCWP tracing - s/p VAD  7. DM2, poorly controlled - hgbA1c 11.1% - continue insulin and SSI   8. ID/leukocytosis - 4/11 procalcitonin 1.96  - Started empiric coverage for PNA with vancomycin/cefepime for 7 days. Stopped 4/17 - Given recent VATs and high-dose pressor requirement will cover with broad spectrum abx for now, linezolid and cefepime. WBC down to 13.1    9. Acute Hypoxic Respiratory Failure - Re-intubated 4/20 with hemothorax- No output from CT - CXR pending.   10. Malnutrition  - Albumin 2.9   - On tube feeds.    11. CVA/anoxic brain injury - 03/27/20 CT large posterior MCA ischemic infarct, possibly from atrial fibrillation.  INR was supratherapeutic when CVA found.  Stable LDH, do not think partial pump thrombosis is the culprit. - had hypotension with events on 4/20.  RN concern over posturing. EEG 4/21 demonstrated moderate to severe diffuse encephalopathy. No seizures or epileptiform discharges were seen   12. Anemia - Unchanged 7.7  - transfuse for hgb < 7  13. Hyperkalemia - continue CVVHD   Length of Stay: [place] NP-C   04/04/2020, 7:31 AM  Advanced Heart Failure Team Pager [phone] (M-F; 7a - 4p)  Please contact [facility] for night-coverage after hours (4p -7a ) and weekends on [email or web address]  Agree with above.  Awake on vent. Will move all 4 to command. CT yesterday with large R MCA stroke (unchanged).   Remains on Epi 6, NE 4, VP 0.03 + Milrinone 0.25. Co-ox 75% VAD parameters stable   On CVVHD  pulling -50. CVP 9-10. Anuric  INR 2.4  General:  Awake on vent following commands HEENT: normal  Neck: supple. JVP 9-10 Carotids 2+ bilat; no bruits. No lymphadenopathy or thryomegaly appreciated. Cor: LVAD hum.  + R  CT Lungs: Clear dull at R base   Abdomen: obese soft, nontender, non-distended. No hepatosplenomegaly. No bruits or masses. Good bowel sounds.  Driveline site clean. Anchor in place.  Extremities: no cyanosis, clubbing, rash. Warm 2+ flank edema  Neuro: awake on vent following commands. Will move all 4   Mental status improved. Head CT stable. Continue to wean pressors. Will Increase CVVHD rate to -75. VAD interrogated personally. Parameters stable. Hopefully can work toward extubation soon.   CXR stable with small left effusion. INR ok. Ok to give low-dose coumadin (d/w Dr. Prescott Gum)   CRITICAL CARE Performed by: Glori Bickers  Total critical care time: 35 minutes  Critical care time was exclusive of separately billable procedures and treating other patients.  Critical care was necessary to treat or prevent imminent or life-threatening deterioration.  Critical care was time spent personally by me (independent of midlevel providers or residents) on the following activities: development of treatment plan with patient and/or surrogate as well as nursing, discussions with consultants, evaluation of patient's response to treatment, examination of patient, obtaining history from patient or surrogate, ordering and performing treatments and interventions, ordering and review of laboratory studies, ordering and review of radiographic studies, pulse oximetry and re-evaluation of patient's condition.  Glori Bickers, MD  9:28 AM

## 2020-04-04 NOTE — Progress Notes (Signed)
eLink Physician-Brief Progress Note Patient Name: [name] [name] DOB: October 30, 1951 MRN: [id number]   Date of Service  04/04/2020  HPI/Events of Note  Pt continuous with watery diarrhea  eICU Interventions  Flexiseal order entered.        Marita Burnsed U Adiel Mcnamara 04/04/2020, 1:00 AM

## 2020-04-04 NOTE — Procedures (Signed)
Admit: 02/22/2020 LOS: 10  22M dialysis dependent AKI on CRRT, acute CHF exacerbation s/p VAD, status post MV repair, AFib s/p MAZE and LAA clipping 4/8; R MCA CVA 4/15  Current CRRT Prescription: Start Date: 4/13 Catheter: L Norge Temp HD cath BFR: 200 Pre Blood Pump: 500 4K DFR: 1500 4K Replacement Rate: 300 0K Goal UF: 47m/h net neg Anticoagulation: systemic anticoagulation on hold after hemothorax Clotting: acceptable   S:  Epi, NE, VP, milrinone  No UOP  K 4.9, P 3.2 this AM   O: 04/22 0701 - 04/23 0700 In: 3742.6 [I.V.:2262.6; NG/GT:1280; IV Piggyback:200.1] Out: 4567 [Emesis/NG output:80; Stool:350; Chest Tube:40]  Filed Weights   04/02/20 0500 04/03/20 0500 04/04/20 0300  Weight: 107.5 kg 106.7 kg 105.9 kg    Recent Labs  Lab 04/03/20 0329 04/03/20 0329 04/03/20 1607 04/04/20 0332 04/04/20 0337  NA 136   < > 135 139 137  K 5.3*   < > 5.3* 4.9 4.8  CL 102  --  102 103  --   CO2 25  --  24 25  --   GLUCOSE 255*  --  284* 188*  --   BUN 37*  --  39* 35*  --   CREATININE 2.53*  --  2.54* 2.25*  --   CALCIUM 7.9*  --  7.8* 8.2*  --   PHOS 4.0  --  3.7 3.2  --    < > = values in this interval not displayed.   Recent Labs  Lab 03/31/20 0339 03/31/20 0425 04/09/2020 0410 04/09/2020 0512 04/02/20 0429 04/03/20 0318 04/03/20 0329 04/04/20 0332 04/04/20 0337  WBC 13.3*   < > 19.5*   < > 26.9*  --  18.5* 13.1*  --   NEUTROABS 11.4*  --  16.9*  --  24.0*  --   --   --   --   HGB 9.3*   < > 8.5*   < > 8.8*   < > 7.7* 7.7* 7.8*  HCT 28.7*   < > 27.0*   < > 27.1*   < > 24.4* 24.7* 23.0*  MCV 92.9   < > 92.5   < > 87.7  --  92.1 94.3  --   PLT 134*   < > 153   < > 129*  --  108* 124*  --    < > = values in this interval not displayed.    Scheduled Meds: . sodium chloride   Intravenous Once  . aspirin  81 mg Per Tube Daily  . atorvastatin  80 mg Per Tube q1800  . B-complex with vitamin C  1 tablet Per Tube Daily  . bisacodyl  10 mg Oral Daily   Or  .  bisacodyl  10 mg Rectal Daily  . chlorhexidine gluconate (MEDLINE KIT)  15 mL Mouth Rinse BID  . Chlorhexidine Gluconate Cloth  6 each Topical Daily  . docusate  200 mg Per Tube Daily  . feeding supplement (PRO-STAT SUGAR FREE 64)  30 mL Per Tube QID  . influenza vaccine adjuvanted  0.5 mL Intramuscular Tomorrow-1000  . insulin aspart  0-24 Units Subcutaneous Q4H  . insulin aspart  6 Units Subcutaneous Q4H  . insulin glargine  24 Units Subcutaneous BID  . linezolid  600 mg Per Tube Q12H  . mouth rinse  15 mL Mouth Rinse Q2H  . melatonin  3 mg Per Tube QHS  . metoCLOPramide (REGLAN) injection  5 mg Intravenous Q8H  .  pantoprazole (PROTONIX) IV  40 mg Intravenous Daily  . polyethylene glycol  17 g Per Tube Daily  . QUEtiapine  25 mg Per Tube QHS  . sildenafil  20 mg Per Tube TID  . sodium chloride flush  10-40 mL Intracatheter Q12H  . warfarin  0.5 mg Per Tube ONCE-1600  . Warfarin - Pharmacist Dosing Inpatient   Does not apply q1600   Continuous Infusions: .  prismasol BGK 4/2.5 500 mL/hr at 04/04/20 0235  . sodium chloride Stopped (03/22/20 2011)  . sodium chloride Stopped (03/27/20 1523)  . sodium chloride    . sodium chloride 10 mL/hr at 04/02/20 1055  . sodium chloride    . amiodarone 30 mg/hr (04/04/20 1100)  . ceFEPime (MAXIPIME) IV Stopped (04/04/20 0934)  . dexmedetomidine Stopped (04/02/20 0617)  . epinephrine 6 mcg/min (04/04/20 1100)  . feeding supplement (VITAL 1.5 CAL) 50 mL/hr at 04/03/20 2000  . fentaNYL infusion INTRAVENOUS 25 mcg/hr (04/04/20 1100)  . lactated ringers Stopped (03/29/20 0748)  . milrinone 0.25 mcg/kg/min (04/04/20 1100)  . norepinephrine (LEVOPHED) Adult infusion 4 mcg/min (04/04/20 1100)  . prismasol BGK 0/2.5 300 mL/hr at 04/04/20 0247  . prismasol BGK 4/2.5 1,500 mL/hr at 04/04/20 0923  . vasopressin (PITRESSIN) infusion - *FOR SHOCK* 0.03 Units/min (04/04/20 1100)   PRN Meds:.sodium chloride, acetaminophen, dextrose, fentaNYL (SUBLIMAZE)  injection, fentaNYL (SUBLIMAZE) injection, heparin, heparin, hydrALAZINE, HYDROcodone-acetaminophen, levalbuterol, ondansetron (ZOFRAN) IV, ondansetron (ZOFRAN) IV, oxyCODONE, sodium chloride, sodium chloride flush, sorbitol, traMADol  ABG    Component Value Date/Time   PHART 7.385 04/04/2020 0337   PCO2ART 47.2 04/04/2020 0337   PO2ART 149 (H) 04/04/2020 0337   HCO3 28.4 (H) 04/04/2020 0337   TCO2 30 04/04/2020 0337   ACIDBASEDEF 2.0 03/16/2020 1405   O2SAT 99.0 04/04/2020 0337    A/P  1. 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 Hypophosphatemia, replete prn for P < 2.5 3. sCHF s/p LVAD 4/8 4. Hemothorax s/p VATS 03/13/2020 5. AFib s/p MAZE and LAA clipping 4/8 6. DM2 7. R MCA ischemic CVA 8. SHock on Epi, NE and VP, on milrinone 9. Anemia  Cont CRRT, keep post bath 0K unles K <4.5  Pearson Grippe, MD [employer]

## 2020-04-05 ENCOUNTER — Inpatient Hospital Stay (HOSPITAL_COMMUNITY): Payer: Medicare HMO

## 2020-04-05 DIAGNOSIS — I4811 Longstanding persistent atrial fibrillation: Secondary | ICD-10-CM | POA: Diagnosis not present

## 2020-04-05 DIAGNOSIS — N179 Acute kidney failure, unspecified: Secondary | ICD-10-CM | POA: Diagnosis not present

## 2020-04-05 DIAGNOSIS — J9601 Acute respiratory failure with hypoxia: Secondary | ICD-10-CM | POA: Diagnosis not present

## 2020-04-05 DIAGNOSIS — R570 Cardiogenic shock: Secondary | ICD-10-CM | POA: Diagnosis not present

## 2020-04-05 LAB — RENAL FUNCTION PANEL
Albumin: 2.2 g/dL — ABNORMAL LOW (ref 3.5–5.0)
Albumin: 2 g/dL — ABNORMAL LOW (ref 3.5–5.0)
Anion gap: 7 (ref 5–15)
Anion gap: 8 (ref 5–15)
BUN: 30 mg/dL — ABNORMAL HIGH (ref 8–23)
BUN: 30 mg/dL — ABNORMAL HIGH (ref 8–23)
CO2: 26 mmol/L (ref 22–32)
CO2: 25 mmol/L (ref 22–32)
Calcium: 8.4 mg/dL — ABNORMAL LOW (ref 8.9–10.3)
Calcium: 8.2 mg/dL — ABNORMAL LOW (ref 8.9–10.3)
Chloride: 105 mmol/L (ref 98–111)
Chloride: 102 mmol/L (ref 98–111)
Creatinine, Ser: 1.78 mg/dL — ABNORMAL HIGH (ref 0.61–1.24)
Creatinine, Ser: 1.79 mg/dL — ABNORMAL HIGH (ref 0.61–1.24)
GFR calc Af Amer: 44 mL/min — ABNORMAL LOW (ref >60)
GFR calc Af Amer: 44 mL/min — ABNORMAL LOW (ref >60)
GFR calc non Af Amer: 38 mL/min — ABNORMAL LOW (ref >60)
GFR calc non Af Amer: 38 mL/min — ABNORMAL LOW (ref >60)
Glucose, Bld: 156 mg/dL — ABNORMAL HIGH (ref 70–99)
Glucose, Bld: 240 mg/dL — ABNORMAL HIGH (ref 70–99)
Phosphorus: 2.7 mg/dL (ref 2.5–4.6)
Phosphorus: 2.2 mg/dL — ABNORMAL LOW (ref 2.5–4.6)
Potassium: 5.2 mmol/L — ABNORMAL HIGH (ref 3.5–5.1)
Potassium: 5 mmol/L (ref 3.5–5.1)
Sodium: 138 mmol/L (ref 135–145)
Sodium: 135 mmol/L (ref 135–145)

## 2020-04-05 LAB — CULTURE, RESPIRATORY W GRAM STAIN
Culture: NORMAL
Special Requests: NORMAL

## 2020-04-05 LAB — HEPATIC FUNCTION PANEL
ALT: 23 U/L (ref 0–44)
AST: 33 U/L (ref 15–41)
Albumin: 2.1 g/dL — ABNORMAL LOW (ref 3.5–5.0)
Alkaline Phosphatase: 142 U/L — ABNORMAL HIGH (ref 38–126)
Bilirubin, Direct: 0.3 mg/dL — ABNORMAL HIGH (ref 0.0–0.2)
Indirect Bilirubin: 0.3 mg/dL (ref 0.3–0.9)
Total Bilirubin: 0.6 mg/dL (ref 0.3–1.2)
Total Protein: 5.9 g/dL — ABNORMAL LOW (ref 6.5–8.1)

## 2020-04-05 LAB — GLUCOSE, CAPILLARY
Glucose-Capillary: 108 mg/dL — ABNORMAL HIGH (ref 70–99)
Glucose-Capillary: 109 mg/dL — ABNORMAL HIGH (ref 70–99)
Glucose-Capillary: 120 mg/dL — ABNORMAL HIGH (ref 70–99)
Glucose-Capillary: 152 mg/dL — ABNORMAL HIGH (ref 70–99)
Glucose-Capillary: 156 mg/dL — ABNORMAL HIGH (ref 70–99)
Glucose-Capillary: 222 mg/dL — ABNORMAL HIGH (ref 70–99)
Glucose-Capillary: 67 mg/dL — ABNORMAL LOW (ref 70–99)
Glucose-Capillary: 69 mg/dL — ABNORMAL LOW (ref 70–99)
Glucose-Capillary: 77 mg/dL (ref 70–99)

## 2020-04-05 LAB — CBC
HCT: 25.1 % — ABNORMAL LOW (ref 39.0–52.0)
HCT: 27 % — ABNORMAL LOW (ref 39.0–52.0)
Hemoglobin: 7.6 g/dL — ABNORMAL LOW (ref 13.0–17.0)
Hemoglobin: 8.2 g/dL — ABNORMAL LOW (ref 13.0–17.0)
MCH: 28.8 pg (ref 26.0–34.0)
MCH: 28.6 pg (ref 26.0–34.0)
MCHC: 30.3 g/dL (ref 30.0–36.0)
MCHC: 30.4 g/dL (ref 30.0–36.0)
MCV: 95.1 fL (ref 80.0–100.0)
MCV: 94.1 fL (ref 80.0–100.0)
Platelets: 124 10*3/uL — ABNORMAL LOW (ref 150–400)
Platelets: 120 10*3/uL — ABNORMAL LOW (ref 150–400)
RBC: 2.64 MIL/uL — ABNORMAL LOW (ref 4.22–5.81)
RBC: 2.87 MIL/uL — ABNORMAL LOW (ref 4.22–5.81)
RDW: 16.7 % — ABNORMAL HIGH (ref 11.5–15.5)
RDW: 16.2 % — ABNORMAL HIGH (ref 11.5–15.5)
WBC: 10.7 10*3/uL — ABNORMAL HIGH (ref 4.0–10.5)
WBC: 7.8 10*3/uL (ref 4.0–10.5)
nRBC: 0.3 % — ABNORMAL HIGH (ref 0.0–0.2)
nRBC: 0.6 % — ABNORMAL HIGH (ref 0.0–0.2)

## 2020-04-05 LAB — COOXEMETRY PANEL
Carboxyhemoglobin: 1.4 % (ref 0.5–1.5)
Methemoglobin: 1.1 % (ref 0.0–1.5)
O2 Saturation: 62.3 %
Total hemoglobin: 5.2 g/dL — CL (ref 12.0–16.0)

## 2020-04-05 LAB — PREPARE RBC (CROSSMATCH)

## 2020-04-05 LAB — PROTIME-INR
INR: 1.9 — ABNORMAL HIGH (ref 0.8–1.2)
Prothrombin Time: 21.3 seconds — ABNORMAL HIGH (ref 11.4–15.2)

## 2020-04-05 LAB — MAGNESIUM: Magnesium: 2.6 mg/dL — ABNORMAL HIGH (ref 1.7–2.4)

## 2020-04-05 LAB — LACTATE DEHYDROGENASE: LDH: 273 U/L — ABNORMAL HIGH (ref 98–192)

## 2020-04-05 MED ORDER — SODIUM CHLORIDE 0.9% IV SOLUTION: Freq: Once | INTRAVENOUS | Status: AC

## 2020-04-05 MED ORDER — DEXMEDETOMIDINE HCL IN NACL 400 MCG/100ML IV SOLN
0.4000 ug/kg/h | INTRAVENOUS | Status: DC
  Administered 2020-04-05: 14:00:00 0.8 ug/kg/h via INTRAVENOUS
  Administered 2020-04-05: 20:00:00 0.7 ug/kg/h via INTRAVENOUS
  Administered 2020-04-06: 0.4 ug/kg/h via INTRAVENOUS
  Filled 2020-04-05 (×5): qty 100

## 2020-04-05 MED ORDER — WARFARIN 0.5 MG HALF TABLET
0.5000 mg | ORAL_TABLET | Freq: Once | ORAL | Status: AC
  Administered 2020-04-05: 16:00:00 0.5 mg
  Filled 2020-04-05: qty 1

## 2020-04-05 MED ORDER — FENTANYL CITRATE (PF) 100 MCG/2ML IJ SOLN
25.0000 ug | INTRAMUSCULAR | Status: DC | PRN
  Administered 2020-04-06 (×3): 100 ug via INTRAVENOUS
  Filled 2020-04-05 (×3): qty 2

## 2020-04-05 NOTE — Progress Notes (Signed)
PULMONARY / CRITICAL CARE MEDICINE   NAME:  [name] [name], MRN:  [id number], DOB:  May 29, 1951, LOS: 24 ADMISSION DATE:  02/19/2020, CONSULTATION DATE:  03/27/20 REFERRING MD:  Aundra Dubin, CHIEF COMPLAINT:  Respiratory failure  BRIEF HISTORY:    69 year old man with advanced ischemic cardiomyopathy, prior mitral valve repair, afib presented for redo sternotomy, LVAD implantation, left atrial appendage clipping, and sternal reconstruction on 03/24/2020.  Hospitalization complicated by renal failure, RV dysfunction, persistent delirium, and R MCA stroke.  Respiratory status has been tenuous.  On the morning of 4/20, patient became agonal with low flow on LVAD followed by intubation and two rounds of epi.  PCCM re-consulted.  HISTORY OF PRESENT ILLNESS   69 year old man with advanced ischemic cardiomyopathy, prior mitral valve repair, afib presented for redo sternotomy, LVAD implantation, left atrial appendage clipping, and sternal reconstruction on 03/19/2020.    Postoperative course complicated by persistent delirium, RV dysfunction, renal failure, abnormal INR of unclear etiology.  Started on CRRT 4/13.  Patient less responsive.  Found on CT head 4/15 with acute posterior R MCA stroke.  Neurology following.  Respiratory status has been tenuous post op as well in which PCCM initially consulted for.  Has remained mostly on BIPAP.  On 4/16, on 5 L [place] and saturating well. He is alert and oriented to self today (4/16).  PCCM available on as needed basis.  On the morning of 4/20, patient became agonal with low flow on LVAD followed by intubation and two rounds of epi.  PCCM re-consulted.  SIGNIFICANT PAST MEDICAL HISTORY   Ischemic cardiomyopathy MV prolapse s/p repair in 2004 HTN HLD CKD  SIGNIFICANT EVENTS:  4/8: redo sternotomy, LVAD implantation, left atrial appendage clipping, and sternal reconstruction  4/9: extubated 4/13 CRRT started  4/15 R MCA CVA on [facility] 4/16 off Bipap, on 5L [place] 4/19 CRRT  stopped, respiratory arrest, intubated, chest tube placed for L hemothorax  4/20 L VATS with 2 pleural drains placed   STUDIES:   CT Head 03/27/20 IMPRESSION: Large posterior right MCA infarct, new since prior study.  CT a/p 03/16/2020 IMPRESSION: 1. No acute intra-abdominal or pelvic pathology. No bowel obstruction. Normal appendix. 2. Cirrhosis with small perihepatic ascites. 3. Cholelithiasis. No evidence of acute cholecystitis by CT. 4. Aortic Atherosclerosis (ICD10-I70.0).  4/15 CXR volume overload  Echo 4/10 1. Images are poor quality and cannot comment on global LV function. .  Left ventricular ejection fraction, by estimation, is 20 to 25%. The left  ventricle has severely decreased function. The left ventricle demonstrates  global hypokinesis. There is  moderate concentric left ventricular hypertrophy.  2. Aortic dilatation noted. There is mild dilatation of the ascending  aorta and of the aortic root measuring 41 mm and 87mm respectively.  3. Right ventricular systolic function is severely reduced. The right  ventricular size is moderately enlarged.  4. The aortic valve is tricuspid. Mild aortic valve sclerosis is present,  with no evidence of aortic valve stenosis.   CULTURES:  COVID swab neg on admission MRSA PCR neg 4/7 Blood cultures x 2 4/13 neg Urine culture 4/12 neg  Bcx 4/20>  Respiratory cx 4/20>   ANTIBIOTICS:  Vanc 4/12 - 4/17 Cefepime 4/11 - 4/17 Linezolid 4/20 >  Cefepime 4/20>   LINES/TUBES:  4/8: redo sternotomy, LVAD implantation, left atrial appendage clipping, and sternal reconstruction   RIJ introducer >> R [place] HD catheter >> R radial A line >> out R/L pleural drains  >> out  4/20 ETT >> 4/20 R radial aline >> 4/20 left chest tube, 2 pleural drains >>  CONSULTANTS:  CHF, TCTS, PCCM, Nephro, Neuro, Hematology  SUBJECTIVE:  Remains on norepinephrine, milrinone and vasopressin infusions, amio at 30 . Fentanyl at 50, but was  off during weaning trial 550 cc of output from Flexiseal No fever, WBC down trending  Some intermittent following of commands  Failed SBT today after 45 minutes 2/2 elevated RR CVVH removal 50 - 75 cc per hour>> Net negative 13,450 Daughter from [place]  has brought HCPOA paperwork CONSTITUTIONAL: BP 99/81   Pulse 77   Temp (!) 96.4 F (35.8 C) (Axillary)   Resp 14   Ht 6' (1.829 m)   Wt 104.5 kg   SpO2 100%   BMI 31.25 kg/m   I/O last 3 completed shifts: In: 5270.9 [I.V.:2960.8; NG/GT:2010; IV Piggyback:300.1] Out: 8006 [Emesis/NG output:80; Other:7026; Stool:900]  CVP:  [8 mmHg-18 mmHg] 9 mmHg  Vent Mode: PRVC FiO2 (%):  [40 %] 40 % Set Rate:  [18 bmp] 18 bmp Vt Set:  [371 mL] 620 mL PEEP:  [5 cmH20] 5 cmH20 Pressure Support:  [10 cmH20] 10 cmH20 Plateau Pressure:  [18 cmH20-23 cmH20] 23 cmH20  PHYSICAL EXAM: General: critically-ill appearing male, sedated and intubated, on CVVHD HEENT: NCAT, ETT in place, CorTrack in place, RIJ CVC, [facility] HD cath Neuro: does \\opem  eyes to voice for daughter, does  follow commands intermittently, MAE x 4 PULM: Bilateral chest excursion, coarse breath sounds bilaterally, diminished per R base  CV: LVAD hum GI: soft, hypoactive bowel sounds , ND, NT, Large output diarrhea Extremities: 1+ pitting edema in all 4 extremities  Skin: warm dry and intact, no rash or lesions  RESOLVED PROBLEM LIST    ASSESSMENT AND PLAN    # Acute hypoxic respiratory failure secondary to L hemothorax s/p L CT placement and VATS 4/20 L chest tube without leak >>  minimal drainage -- RT attempted to wean patient 4/24  AM , low volume  Breaths and tachypnea, so failed due to RR.  -- CXR 4/24 pending >>  yesterday - vascular congestion with L upper lobe opacities and L pleural effusion vs atelectasis, cont' CVHD for fluid removal  - Continue SBT daily - Minimize sedation - Wean FiO2 and PEEP as able -- Consider bronchoscopy once he is more stable  -- PAD  protocol  -- VAP precautions in place   -- L Chest tube and 2 pleural drain in place with minimal bloody drainage   -- Follow up repeat respiratory and Bcx, collected 4/20, negative to date  -- Palliative care involved and updating family  # Cardiogenic shock   # Suspected PEA arrest  # ICM s/p LVAD placement 4/8 for destination therapy  - LVAD settings are stable -- No obvious tamponade physiology on bedside TTE by Dr. Haroldine Laws  -- On norepi, and vaso gtt.  epi off, 4/24, can uptitrate norepi to help with this. Will defer final management to TCTS  -- Co-ox 62.3 on 4/24 am -- Trend H/H -- LVAD Management per TCT/CHF team -- On ASA, resuming Coumadin today, INR 2.4  -- INR goal 2-3    # Right MCA cardioembolic stroke, suspected secondary to Afib  # Seizure like-activity  -- Neuro signed off 4/17. Recommended continuing AC with ASA and warfarin  -- Patient noted to have rhythmic movements of bilateral UE 4/21. EEG showed moderate to severe diffuse encephalopathy, but no seizures or epileptiform discharges. - Continued Neuro exams  per unit protocol   # Chronic afiblled -- S/p maze and LAA clipping 4/8 -- Resuming Coumadin today  -- On amiodarone drip    # Acute renal failure on CKD  -- Nephrology following  -- On CRRT 4/19   -- Remains anuric -- Trend renal panel/ mag daily   # DM type II uncontrolled  - SSI and Lantus  # Large Volume Diarrhea - No fever>> WBC is down trending>> low suspicion C diff - Will add immodium - Dietitian to eval for change in TF  # GOC:  -- Palliative care following   - Daughter brought HCPOA papers 4/24   Best Practice / Goals of Care / Disposition.   Diet: TF  Pain/Anxiety/Delirium protocol (if indicated): fentanyl gtt VAP protocol (if indicated): yes DVT prophylaxis: warfarin, SCD  GI prophylaxis: PPI Glucose control: SSI Mobility: BR Code Status: partial- no compression given LVAD Family Communication: per primary Disposition:  ICU  LABS  Glucose Recent Labs  Lab 04/05/20 0013 04/05/20 0352 04/05/20 0531 04/05/20 0741 04/05/20 0745 04/05/20 1007  GLUCAP 156* 152* 108* 67* 69* 77    BMET Recent Labs  Lab 04/04/20 0332 04/04/20 0332 04/04/20 0337 04/04/20 1610 04/05/20 0323  NA 139   < > 137 136 138  K 4.9   < > 4.8 4.9 5.2*  CL 103  --   --  102 105  CO2 25  --   --  26 26  BUN 35*  --   --  33* 30*  CREATININE 2.25*  --   --  2.05* 1.78*  GLUCOSE 188*  --   --  162* 156*   < > = values in this interval not displayed.    Liver Enzymes Recent Labs  Lab 04/03/20 0329 04/03/20 1607 04/04/20 0332 04/04/20 1610 04/05/20 0323  AST 37  --  35  --  33  ALT 22  --  22  --  23  ALKPHOS 119  --  112  --  142*  BILITOT 0.8  --  0.9  --  0.6  ALBUMIN 2.3*  2.2*   < > 2.2*  2.1* 2.3* 2.1*  2.2*   < > = values in this interval not displayed.    Electrolytes Recent Labs  Lab 04/03/20 0329 04/03/20 1607 04/04/20 0332 04/04/20 1610 04/05/20 0323  CALCIUM 7.9*   < > 8.2* 8.1* 8.4*  MG 2.7*  --  2.7*  --  2.6*  PHOS 4.0   < > 3.2 3.1 2.7   < > = values in this interval not displayed.    CBC Recent Labs  Lab 04/03/20 0329 04/03/20 0329 04/04/20 0332 04/04/20 0337 04/05/20 0323  WBC 18.5*  --  13.1*  --  10.7*  HGB 7.7*   < > 7.7* 7.8* 7.6*  HCT 24.4*   < > 24.7* 23.0* 25.1*  PLT 108*  --  124*  --  124*   < > = values in this interval not displayed.    ABG Recent Labs  Lab 03/27/2020 1743 04/03/20 0318 04/04/20 0337  PHART 7.349* 7.382 7.385  PCO2ART 47.3 46.6 47.2  PO2ART 203* 108 149*    Coag's Recent Labs  Lab 04/03/20 0329 04/04/20 0332 04/05/20 0323  INR 3.4* 2.4* 1.9*    Sepsis Markers No results for input(s): LATICACIDVEN, PROCALCITON, O2SATVEN in the last 168 hours.  Cardiac Enzymes No results for input(s): TROPONINI, PROBNP in the last 168 hours.   Magdalen Spatz, MSN,  AGACNP-BC [facility] Check Amion on call  for  pager information 04/05/2020 10:40 AM

## 2020-04-05 NOTE — Consult Note (Signed)
Responded to page from nurse, who advised that pt's daughters were here and would like to change his healthcare papers. Advised nurse that will have to be done during the week, when notaries are available.   Rev. Eloise Levels Chaplain

## 2020-04-05 NOTE — Progress Notes (Signed)
ANTICOAGULATION CONSULT NOTE  Pharmacy Consult for warfarin Indication: post op LVAD  / Aflutter  Allergies  Allergen Reactions  . Liraglutide Nausea And Vomiting  . Lisinopril Cough    Patient Measurements: Height: 6' (182.9 cm) Weight: 104.5 kg (230 lb 6.1 oz) IBW/kg (Calculated) : 77.6 Heparin Dosing Weight: 101.2 kg  Vital Signs: Temp: 97.8 F (36.6 C) (04/24 0000) Temp Source: Oral (04/24 0000) BP: 105/81 (04/24 0700) Pulse Rate: 84 (04/24 0700)  Labs: Recent Labs    04/03/20 0329 04/03/20 1607 04/04/20 0332 04/04/20 0332 04/04/20 0337 04/04/20 1610 04/05/20 0323  HGB 7.7*  --  7.7*   < > 7.8*  --  7.6*  HCT 24.4*  --  24.7*  --  23.0*  --  25.1*  PLT 108*  --  124*  --   --   --  124*  LABPROT 34.3*  --  25.9*  --   --   --  21.3*  INR 3.4*  --  2.4*  --   --   --  1.9*  CREATININE 2.53*   < > 2.25*  --   --  2.05* 1.78*   < > = values in this interval not displayed.    Estimated Creatinine Clearance: 48.3 mL/min (A) (by C-G formula based on SCr of 1.78 mg/dL (H)).   Medical History: Past Medical History:  Diagnosis Date  . Anxiety   . Arthritis   . CAD (coronary artery disease)    a.  1993 s/p MI - [employer];  b. s/p BMS to LAD '00;  c. PTCA 2nd diagonal 2010;  d. 02/18/12 Cath: moderate nonobs dzs - med rx;  e.  01/2015 Cath: LM nl, LAD 40-34m ISR, 70-74m/d, d1 90p (3.0x16 Synergy DES), D2 50-60, LCX nl, OM1 50p, 16m (2.5x12 Synergy DES), RCA nl, EF 30-35%.  . Chronic combined systolic and diastolic CHF (congestive heart failure) ([place])    a. 12/2014 Echo: EF 30-35%, Gr2 DD, mod MR, sev dil LA.  . CKD (chronic kidney disease), stage III   . Depression   . ED (erectile dysfunction)   . GERD (gastroesophageal reflux disease)   . Hyperlipidemia   . Hypertension   . Ischemic cardiomyopathy    a. s/p [employer] [name] (Atlas) ICD implanted in [place] 2007;  b. 12/2014 Echo: Ef 30-35%.  . MVP (mitral valve prolapse)    a. s/p MV annuloplasty at [facility]  2004.  Marland Kitchen Persistent atrial fibrillation ([place])    a. noted on ICD interrogation '10 - not previously on [place] - CHA2DS2VASc = 5.  . Type II diabetes mellitus ([place])    uncontrolled    Medications:  Medications Prior to Admission  Medication Sig Dispense Refill Last Dose  . ALPRAZolam (XANAX) 0.25 MG tablet Take 0.25 mg by mouth 3 (three) times daily as needed for anxiety.    unknown  . amiodarone (PACERONE) 200 MG tablet Take 200 mg by mouth daily.    03/11/2020 at Unknown time  . apixaban (ELIQUIS) 5 MG TABS tablet Take 1 tablet (5 mg total) by mouth 2 (two) times daily. 60 tablet 1 03/11/2020 at 0600  . atorvastatin (LIPITOR) 40 MG tablet Take 1 tablet (40 mg total) by mouth daily at 6 PM. 30 tablet 0 03/10/2020 at Unknown time  . citalopram (CELEXA) 20 MG tablet Take 20 mg by mouth daily.    03/11/2020 at Unknown time  . furosemide (LASIX) 40 MG tablet Take 40 mg by mouth daily.  03/11/2020 at Unknown time  . insulin aspart (NOVOLOG FLEXPEN) 100 UNIT/ML FlexPen Inject 6 Units into the skin 3 (three) times daily with meals. 15 mL 0 03/11/2020 at Unknown time  . Insulin Glargine (BASAGLAR KWIKPEN) 100 UNIT/ML SOPN Inject 0.5 mLs (50 Units total) into the skin daily. 15 mL 1 03/11/2020 at Unknown time  . metoprolol succinate (TOPROL-XL) 100 MG 24 hr tablet Take 1 tablet (100 mg total) by mouth daily. 90 tablet 3 03/11/2020 at 0600  . nitroGLYCERIN (NITROSTAT) 0.4 MG SL tablet Place 1 tablet (0.4 mg total) under the tongue every 5 (five) minutes x 3 doses as needed for chest pain. 10 tablet 0 unknown  . pantoprazole (PROTONIX) 40 MG tablet Take 1 tablet (40 mg total) by mouth daily. 30 tablet 1 03/11/2020 at Unknown time  . potassium chloride SA (KLOR-CON) 20 MEQ tablet Take 20 mEq by mouth daily.    03/11/2020 at Unknown time  . tamsulosin (FLOMAX) 0.4 MG CAPS capsule Take 1 capsule (0.4 mg total) by mouth daily. 30 capsule 0 03/11/2020 at Unknown time  . ALPRAZolam (XANAX) 1 MG tablet Take 1 tablet (1 mg  total) by mouth 2 (two) times daily as needed for anxiety. (Patient not taking: Reported on 03/06/2020) 10 tablet 0 Not Taking at Unknown time    Assessment: [name] with atrial fibrillation on Eliquis PTA s/p R & L heart cath found to have 3v disease s/p  LVAD  Implant 4/8. Warfarin initiated on 4/9 after discussing with MD.  CT on 4/15 now found to have large right MCA stroke. INR had been elevated up to 9.0 postop, he received 1 dose of vitamin K 1 mg IV on 4/13 and again on 4/15 with INR still elevated at 6.2.   Warfarin resumed cautiously on 4/17. Held doses since 4/20 since he went to the OR for VATS. Warfarin was cautiously resumed on 4/23.  INR is now slightly below goal at 1.9. Hgb stable 7.6. Pltc stable 124. LDH down 273. No bleeding noted. CT with minimal output.   Goal of Therapy:  INR 2-2.5 Monitor platelets by anticoagulation protocol: Yes   Plan:  -Warfarin 0.5 mg x1 today -Daily INR and CBC  Vertis Kelch, PharmD, [facility] PGY2 Cardiology Pharmacy Resident Phone [phone] 04/05/2020       7:23 AM  Please check [email or web address] for unit-specific pharmacist phone numbers

## 2020-04-05 NOTE — Procedures (Signed)
Admit: 03/09/2020 LOS: 24  30M dialysis dependent AKI on CRRT, acute CHF exacerbation s/p VAD, status post MV repair, AFib s/p MAZE and LAA clipping 4/8; R MCA CVA 4/15  Current CRRT Prescription: Start Date: 4/13 Catheter: L [place] Temp HD cath BFR: 200 Pre Blood Pump: 500 4K DFR: 1500 4K Replacement Rate: 300 0K Goal UF: 69m/h net neg Anticoagulation: systemic anticoagulation on hold after hemothorax Clotting: acceptable   S:  Epi, NE, VP, milrinone  No UOP  Following commands  K 5.2, P 2.7 this AM   O: 04/23 0701 - 04/24 0700 In: 3249 [I.V.:1838.9; NG/GT:1210; IV Piggyback:200.1] Out: 5302 [Stool:550]  Filed Weights   04/03/20 0500 04/04/20 0300 04/05/20 0500  Weight: 106.7 kg 105.9 kg 104.5 kg    Recent Labs  Lab 04/04/20 0332 04/04/20 0332 04/04/20 0337 04/04/20 1610 04/05/20 0323  NA 139   < > 137 136 138  K 4.9   < > 4.8 4.9 5.2*  CL 103  --   --  102 105  CO2 25  --   --  26 26  GLUCOSE 188*  --   --  162* 156*  BUN 35*  --   --  33* 30*  CREATININE 2.25*  --   --  2.05* 1.78*  CALCIUM 8.2*  --   --  8.1* 8.4*  PHOS 3.2  --   --  3.1 2.7   < > = values in this interval not displayed.   Recent Labs  Lab 03/31/20 0339 03/31/20 0425 03/25/2020 0410 03/23/2020 0512 04/02/20 0429 04/03/20 0318 04/03/20 0329 04/03/20 0329 04/04/20 0332 04/04/20 0337 04/05/20 0323  WBC 13.3*   < > 19.5*   < > 26.9*  --  18.5*  --  13.1*  --  10.7*  NEUTROABS 11.4*  --  16.9*  --  24.0*  --   --   --   --   --   --   HGB 9.3*   < > 8.5*   < > 8.8*   < > 7.7*   < > 7.7* 7.8* 7.6*  HCT 28.7*   < > 27.0*   < > 27.1*   < > 24.4*   < > 24.7* 23.0* 25.1*  MCV 92.9   < > 92.5   < > 87.7  --  92.1  --  94.3  --  95.1  PLT 134*   < > 153   < > 129*  --  108*  --  124*  --  124*   < > = values in this interval not displayed.    Scheduled Meds: . sodium chloride   Intravenous Once  . aspirin  81 mg Per Tube Daily  . atorvastatin  80 mg Per Tube q1800  . B-complex with  vitamin C  1 tablet Per Tube Daily  . bisacodyl  10 mg Oral Daily   Or  . bisacodyl  10 mg Rectal Daily  . chlorhexidine gluconate (MEDLINE KIT)  15 mL Mouth Rinse BID  . Chlorhexidine Gluconate Cloth  6 each Topical Daily  . docusate  200 mg Per Tube Daily  . feeding supplement (PRO-STAT SUGAR FREE 64)  30 mL Per Tube QID  . influenza vaccine adjuvanted  0.5 mL Intramuscular Tomorrow-1000  . insulin aspart  0-24 Units Subcutaneous Q4H  . insulin aspart  6 Units Subcutaneous Q4H  . insulin glargine  24 Units Subcutaneous BID  . linezolid  600 mg Per Tube Q12H  .  mouth rinse  15 mL Mouth Rinse Q2H  . melatonin  3 mg Per Tube QHS  . metoCLOPramide (REGLAN) injection  5 mg Intravenous Q8H  . pantoprazole (PROTONIX) IV  40 mg Intravenous Daily  . polyethylene glycol  17 g Per Tube Daily  . QUEtiapine  25 mg Per Tube QHS  . sildenafil  20 mg Per Tube TID  . sodium chloride flush  10-40 mL Intracatheter Q12H  . warfarin  0.5 mg Per Tube ONCE-1600  . Warfarin - Pharmacist Dosing Inpatient   Does not apply q1600   Continuous Infusions: .  prismasol BGK 4/2.5 500 mL/hr at 04/05/20 0736  . sodium chloride Stopped (03/22/20 2011)  . sodium chloride Stopped (03/27/20 1523)  . sodium chloride    . sodium chloride 10 mL/hr at 04/02/20 1055  . sodium chloride    . amiodarone 30 mg/hr (04/05/20 1000)  . ceFEPime (MAXIPIME) IV 2 g (04/05/20 1011)  . dexmedetomidine Stopped (04/02/20 0617)  . epinephrine Stopped (04/05/20 0854)  . feeding supplement (VITAL 1.5 CAL) 1,000 mL (04/04/20 2204)  . fentaNYL infusion INTRAVENOUS 50 mcg/hr (04/05/20 1000)  . lactated ringers Stopped (03/29/20 0748)  . milrinone 0.25 mcg/kg/min (04/05/20 1018)  . norepinephrine (LEVOPHED) Adult infusion 4 mcg/min (04/05/20 1022)  . prismasol BGK 0/2.5 300 mL/hr at 04/04/20 1955  . prismasol BGK 4/2.5 1,500 mL/hr at 04/05/20 1029  . vasopressin (PITRESSIN) infusion - *FOR SHOCK* 0.03 Units/min (04/05/20 1000)   PRN  Meds:.sodium chloride, acetaminophen, dextrose, fentaNYL (SUBLIMAZE) injection, fentaNYL (SUBLIMAZE) injection, heparin, heparin, hydrALAZINE, HYDROcodone-acetaminophen, levalbuterol, ondansetron (ZOFRAN) IV, ondansetron (ZOFRAN) IV, oxyCODONE, sodium chloride, sodium chloride flush, sorbitol, traMADol  ABG    Component Value Date/Time   PHART 7.385 04/04/2020 0337   PCO2ART 47.2 04/04/2020 0337   PO2ART 149 (H) 04/04/2020 0337   HCO3 28.4 (H) 04/04/2020 0337   TCO2 30 04/04/2020 0337   ACIDBASEDEF 2.0 03/24/2020 1405   O2SAT 62.3 04/05/2020 0330    A/P  1. 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 Hypophosphatemia, replete prn for P < 2.5 3. sCHF s/p LVAD 4/8 4. Hemothorax s/p VATS 03/15/2020 5. AFib s/p MAZE and LAA clipping 4/8 6. DM2 7. R MCA ischemic CVA 8. SHock on Epi, NE and VP, on milrinone 9. Anemia  Cont CRRT, keep post bath 0K unles K <4.5  Pearson Grippe, MD [employer]

## 2020-04-05 NOTE — Progress Notes (Addendum)
Patient ID: [name] [name], male   DOB: [birth year], 69 y.o.   MRN: [id number]    Advanced Heart Failure Rounding Note   Subjective:    - S/p HM3 VAD 4/8 w MAZE procedure + LAA clipping. - Extubated 4/9 - 4/12 LVAD speed increased to 5400 --> echo moderate-severe RV dysfunction, septum mildly left shifted, trivial pericardial effusion.  LVAD speed cut back to 5300.  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  - 4/20 developed hemothorax requiring emergent CT placement and eventual VATS - 4/21 EEG done given concerns for posturing and c/w moderate to severe diffuse encephalopathy. No seizures or epileptiform discharges were seen  - 04/03/20 Pump Stop --> controller change out. CT of head unchanged R MCA CVA  Awake on vent. Following commands. Failed SBT due to apnea. CCM switched fentanyl to Prcedex.   Remains on CVVHD. Now -75/hr CVP 10  Epi off.  On NE 4 milrinone 0.25. VP 0.03 Co-ox 62%   < 50cc CT drainage overnight  INR 1.9 . Hgb 7.6   VAD Interrogation  Flow 4.4, Speed 5350, Power 3.8, Pulse Index 3.5  VAD interrogated personally. Parameters stable.  Objective:   Weight Range:  Vital Signs:   Temp:  [96.4 F (35.8 C)-98.4 F (36.9 C)] 96.4 F (35.8 C) (04/24 0800) Pulse Rate:  [69-107] 76 (04/24 1202) Resp:  [11-26] 26 (04/24 1202) BP: (87-123)/(53-88) 88/53 (04/24 1202) SpO2:  [92 %-100 %] 100 % (04/24 1202) Arterial Line BP: (81-122)/(59-74) 89/63 (04/24 1200) FiO2 (%):  [40 %] 40 % (04/24 1202) Weight:  [104.5 kg] 104.5 kg (04/24 0500) Last BM Date: 04/05/20  Weight change: Filed Weights   04/03/20 0500 04/04/20 0300 04/05/20 0500  Weight: 106.7 kg 105.9 kg 104.5 kg    Intake/Output:   Intake/Output Summary (Last 24 hours) at 04/05/2020 1255 Last data filed at 04/05/2020 1200 Gross per 24 hour  Intake 3309.51 ml  Output 5497 ml  Net -2187.49 ml       Physical Exam: General:  Awake on vent. Following commands HEENT: normal   +ETT Neck: supple. JVP 10.  Carotids 2+ bilat; no bruits. No lymphadenopathy or thryomegaly appreciated. Cor: LVAD hum. Incision ok. Left CT.  Lungs: Clear. Dull at left base Abdomen:  soft, nontender, non-distended. No hepatosplenomegaly. No bruits or masses. Good bowel sounds. Driveline site clean. Anchor in place.  Extremities: no cyanosis, clubbing, rash. Warm trace edema  Neuro: awake on vent following commands  Telemetry: A fib 80s Personally reviewed   Labs: Basic Metabolic Panel: Recent Labs  Lab 04/03/2020 0512 04/08/2020 0600 04/02/20 0429 04/02/20 1620 04/03/20 0329 04/03/20 0329 04/03/20 1607 04/03/20 1607 04/04/20 0332 04/04/20 0337 04/04/20 1610 04/05/20 0323  NA 132*   < > 138   < > 136   < > 135  --  139 137 136 138  K 5.9*   < > 5.5*   < > 5.3*   < > 5.3*  --  4.9 4.8 4.9 5.2*  CL 96*   < > 103   < > 102  --  102  --  103  --  102 105  CO2 21*   < > 24   < > 25  --  24  --  25  --  26 26  GLUCOSE 301*   < > 212*   < > 255*  --  284*  --  188*  --  162* 156*  BUN 42*   < >  45*   < > 37*  --  39*  --  35*  --  33* 30*  CREATININE 3.29*   < > 3.61*   < > 2.53*  --  2.54*  --  2.25*  --  2.05* 1.78*  CALCIUM 8.6*   < > 7.9*   < > 7.9*   < > 7.8*   < > 8.2*  --  8.1* 8.4*  MG 2.9*  --  2.5*  --  2.7*  --   --   --  2.7*  --   --  2.6*  PHOS 5.7*   < > 5.2*   < > 4.0  --  3.7  --  3.2  --  3.1 2.7   < > = values in this interval not displayed.    Liver Function Tests: Recent Labs  Lab 03/31/20 0339 03/31/20 1649 04/02/20 0429 04/02/20 1620 04/03/20 0329 04/03/20 1607 04/04/20 0332 04/04/20 1610 04/05/20 0323  AST 30  --  35  --  37  --  35  --  33  ALT 21  --  20  --  22  --  22  --  23  ALKPHOS 116  --  78  --  119  --  112  --  142*  BILITOT 0.9  --  1.4*  --  0.8  --  0.9  --  0.6  PROT 5.5*  --  5.2*  --  5.2*  --  5.6*  --  5.9*  ALBUMIN 2.2*  2.2*   < > 2.6*  2.6*   < > 2.3*  2.2* 2.2* 2.2*  2.1* 2.3* 2.1*  2.2*   < > = values in this  interval not displayed.   No results for input(s): LIPASE, AMYLASE in the last 168 hours. No results for input(s): AMMONIA in the last 168 hours.  CBC: Recent Labs  Lab 03/30/20 0324 03/30/20 0340 03/31/20 0339 03/31/20 0425 03/19/2020 0410 03/16/2020 0512 03/24/2020 1726 04/09/2020 1726 04/02/20 0429 04/02/20 0429 04/03/20 0318 04/03/20 0329 04/04/20 0332 04/04/20 0337 04/05/20 0323  WBC 13.7*   < > 13.3*   < > 19.5*   < > 34.6*  --  26.9*  --   --  18.5* 13.1*  --  10.7*  NEUTROABS 11.6*  --  11.4*  --  16.9*  --   --   --  24.0*  --   --   --   --   --   --   HGB 9.5*   < > 9.3*   < > 8.5*   < > 9.4*   < > 8.8*   < > 9.5* 7.7* 7.7* 7.8* 7.6*  HCT 29.4*   < > 28.7*   < > 27.0*   < > 29.0*   < > 27.1*   < > 28.0* 24.4* 24.7* 23.0* 25.1*  MCV 90.5   < > 92.9   < > 92.5   < > 88.1  --  87.7  --   --  92.1 94.3  --  95.1  PLT 134*   < > 134*   < > 153   < > 131*  --  129*  --   --  108* 124*  --  124*   < > = values in this interval not displayed.    Cardiac Enzymes: No results for input(s): CKTOTAL, CKMB, CKMBINDEX, TROPONINI in the last 168 hours.  BNP: BNP (last 3 results) Recent  Labs    03/21/20 0209 03/26/20 2329 04/02/20 2342  BNP 315.9* 425.7* 332.7*    ProBNP (last 3 results) No results for input(s): PROBNP in the last 8760 hours.    Other results:  Imaging: DG CHEST PORT 1 VIEW  Result Date: 04/05/2020 CLINICAL DATA:  Chest tube in place EXAM: PORTABLE CHEST 1 VIEW COMPARISON:  April 04, 2020 FINDINGS: The ETT is in good position. The NG tube terminates below today's film. Stable right IJ sheath. Stable left chest tubes. No pneumothorax. Stable opacity in left base. Stable left ventricular assist device. Mild opacity in the right base. Stable opacity in left mid lung. IMPRESSION: 1. Support apparatus as above. 2. Mild increased opacity in the right base is nonspecific but could represent developing pneumonia or aspiration. Atelectasis possible. 3. Stable opacity  in left perihilar region. 4. Stable left retrocardiac opacity. 5. No other change. Electronically Signed   By: Dorise Bullion III M.D   On: 04/05/2020 12:05   DG Chest Port 1 View  Result Date: 04/04/2020 CLINICAL DATA:  LVAD EXAM: PORTABLE CHEST 1 VIEW COMPARISON:  04/03/2020 FINDINGS: The endotracheal tube, feeding tube, right IJ Cordis and right subclavian catheters are stable. LVAD in good position without complicating features. Persistent left pleural effusion and overlying atelectasis. Improved right lung aeration. The left chest tube is stable. No pneumothorax. IMPRESSION: 1. Stable support apparatus. 2. Improved right lung aeration. Electronically Signed   By: Marijo Sanes M.D.   On: 04/04/2020 10:22     Medications:     Scheduled Medications: . sodium chloride   Intravenous Once  . aspirin  81 mg Per Tube Daily  . atorvastatin  80 mg Per Tube q1800  . B-complex with vitamin C  1 tablet Per Tube Daily  . bisacodyl  10 mg Oral Daily   Or  . bisacodyl  10 mg Rectal Daily  . chlorhexidine gluconate (MEDLINE KIT)  15 mL Mouth Rinse BID  . Chlorhexidine Gluconate Cloth  6 each Topical Daily  . docusate  200 mg Per Tube Daily  . feeding supplement (PRO-STAT SUGAR FREE 64)  30 mL Per Tube QID  . influenza vaccine adjuvanted  0.5 mL Intramuscular Tomorrow-1000  . insulin aspart  0-24 Units Subcutaneous Q4H  . insulin aspart  6 Units Subcutaneous Q4H  . insulin glargine  24 Units Subcutaneous BID  . linezolid  600 mg Per Tube Q12H  . mouth rinse  15 mL Mouth Rinse Q2H  . melatonin  3 mg Per Tube QHS  . metoCLOPramide (REGLAN) injection  5 mg Intravenous Q8H  . pantoprazole (PROTONIX) IV  40 mg Intravenous Daily  . polyethylene glycol  17 g Per Tube Daily  . QUEtiapine  25 mg Per Tube QHS  . sildenafil  20 mg Per Tube TID  . sodium chloride flush  10-40 mL Intracatheter Q12H  . warfarin  0.5 mg Per Tube ONCE-1600  . Warfarin - Pharmacist Dosing Inpatient   Does not apply q1600     Infusions: .  prismasol BGK 4/2.5 500 mL/hr at 04/05/20 0736  . sodium chloride Stopped (03/22/20 2011)  . sodium chloride Stopped (03/27/20 1523)  . sodium chloride    . sodium chloride 10 mL/hr at 04/02/20 1055  . sodium chloride    . amiodarone 30 mg/hr (04/05/20 1200)  . ceFEPime (MAXIPIME) IV Stopped (04/05/20 1055)  . dexmedetomidine Stopped (04/02/20 0617)  . epinephrine Stopped (04/05/20 0854)  . feeding supplement (VITAL 1.5 CAL) 1,000 mL (04/04/20  2204)  . fentaNYL infusion INTRAVENOUS 50 mcg/hr (04/05/20 1200)  . lactated ringers Stopped (03/29/20 0748)  . milrinone 0.25 mcg/kg/min (04/05/20 1200)  . norepinephrine (LEVOPHED) Adult infusion 4 mcg/min (04/05/20 1200)  . prismasol BGK 0/2.5 300 mL/hr at 04/04/20 1955  . prismasol BGK 4/2.5 1,500 mL/hr at 04/05/20 1029  . vasopressin (PITRESSIN) infusion - *FOR SHOCK* 0.03 Units/min (04/05/20 1200)    PRN Medications: sodium chloride, acetaminophen, dextrose, fentaNYL (SUBLIMAZE) injection, fentaNYL (SUBLIMAZE) injection, heparin, heparin, hydrALAZINE, HYDROcodone-acetaminophen, levalbuterol, ondansetron (ZOFRAN) IV, ondansetron (ZOFRAN) IV, oxyCODONE, sodium chloride, sodium chloride flush, sorbitol, traMADol   Assessment/Plan:   1. Acute on chronic systolic HF -> cardiogenic shock-> S/p HM3 LVAD - Echo 2016 EF 30-35% - Echo 1/21 EF 25% - Admitted with NYHA IV symptoms and AKI with attempts at diuresis.  - Echo this admit: EF 10-15% moderate RV dysfunction - R/LHC cath 3/21 with severe 3v CAD and low output with CI 1.7.  - Swan placed. Initial co-ox 39%. Initial PAPi 1.5. Started on milrinone 0.25. - Initially planned for CABG but given need for re-do sternotomy, relatively poor targets and longstanding low EF, VAD felt to be better option  - S/p HM3 VAD 4/8 - Speed turned up on 4/11 to  5400, but looking at 4/10 echo there is significant RV dysfunction with leftward septal shift so speed decreased back to 5300 rpm.   - Developed hemothorax on 4/20 requiring emergent chest tube and VATS - On NE 4, Epi now off VP 0.03 + Milrinone 0.25. Co-ox 62% - Wean VP today - CVP climbing. Now at 74. On CVVHD. Keep pulling - 75cc/hr  2. VAD - VAD interrogated personally.  - Had Pump Stop 04/03/20. Controller changed out with old controller sent back to [facility] for evaluation.  - on aspirin 81 mg daily + coumadin.  - LDH stable at 273  - INR  1.9  - Driveline ok  - Will give 0.27m warfarin today. Discussed dosing with PharmD personally.  3. CAD with unstable angina - s/p previous PCI - cath 02/26/2020 with severe 3v CAD - now s/p VAD on 03/26/2020    4. AKI on CKD 3a - Nephrology consulted. Started CVVHD 4/13  - Renal UKorea4/5 unremarkable.  - CVP ~10  .   Run CVVHD -75.  - Hopefully renal function will recover though likely set back by recent events  5. Permanent AF - now s/p MAZE + LAA Clipping 4/8   - Rate controlled - Continue IV amio while on high-dose pressors - Warfarin as above.  6. MVP s/p MV repair - On admit with recurrent severe MR on echo and with huge v-waves on PCWP tracing - s/p VAD  7. Hemothoax - s/p CT placement and VATs on 4/22 - CTs minimal output - CXR improved - May be able to pull tomorrow   8. ID/leukocytosis - 4/11 procalcitonin 1.96  - Started empiric coverage for PNA with vancomycin/cefepime for 7 days. Stopped 4/17 - Given recent VATs and high-dose pressor requirement will cover with broad spectrum abx for now, linezolid and cefepime. WBC down to 10.7    9. Acute Hypoxic Respiratory Failure - Re-intubated 4/20 with hemothorax- Minimal output from CT - Failed SBT. - CCM changed fentanyl to precadex  10. Malnutrition  - Albumin 2.9   - On tube feeds.    11. CVA/anoxic brain injury - 03/27/20 CT large posterior MCA ischemic infarct, possibly from atrial fibrillation.  INR was supratherapeutic when CVA found.  Stable  LDH, do not think partial pump thrombosis is the  culprit. - had hypotension with events on 4/20.  RN concern over posturing. EEG 4/21 demonstrated moderate to severe diffuse encephalopathy. No seizures or epileptiform discharges were seen   12. Anemia, post-op  - hgb 7.6 - will give 1u today  13. Hyperkalemia - continue CVVHD  14. DM2, poorly controlled - hgbA1c 11.1% - insulin on hold due to hypoglycemia   Length of Stay: 24   CRITICAL CARE Performed by: Glori Bickers  Total critical care time: 35 minutes  Critical care time was exclusive of separately billable procedures and treating other patients.  Critical care was necessary to treat or prevent imminent or life-threatening deterioration.  Critical care was time spent personally by me (independent of midlevel providers or residents) on the following activities: development of treatment plan with patient and/or surrogate as well as nursing, discussions with consultants, evaluation of patient's response to treatment, examination of patient, obtaining history from patient or surrogate, ordering and performing treatments and interventions, ordering and review of laboratory studies, ordering and review of radiographic studies, pulse oximetry and re-evaluation of patient's condition.     Glori Bickers MD   04/05/2020, 12:55 PM  Advanced Heart Failure Team Pager [phone] (M-F; 7a - 4p)  Please contact [facility] for night-coverage after hours (4p -7a ) and weekends on [email or web address]

## 2020-04-06 ENCOUNTER — Inpatient Hospital Stay (HOSPITAL_COMMUNITY): Payer: Medicare HMO

## 2020-04-06 DIAGNOSIS — Z9911 Dependence on respirator [ventilator] status: Secondary | ICD-10-CM | POA: Diagnosis not present

## 2020-04-06 DIAGNOSIS — J9601 Acute respiratory failure with hypoxia: Secondary | ICD-10-CM | POA: Diagnosis not present

## 2020-04-06 DIAGNOSIS — R570 Cardiogenic shock: Secondary | ICD-10-CM | POA: Diagnosis not present

## 2020-04-06 DIAGNOSIS — I5023 Acute on chronic systolic (congestive) heart failure: Secondary | ICD-10-CM | POA: Diagnosis not present

## 2020-04-06 LAB — BPAM FFP
Blood Product Expiration Date: 202104252359
Blood Product Expiration Date: 202104252359
Blood Product Expiration Date: 202104252359
Blood Product Expiration Date: 202104252359
ISSUE DATE / TIME: 202104201349
ISSUE DATE / TIME: 202104201349
ISSUE DATE / TIME: 202104201349
ISSUE DATE / TIME: 202104201349
Unit Type and Rh: 5100
Unit Type and Rh: 5100
Unit Type and Rh: 5100
Unit Type and Rh: 5100

## 2020-04-06 LAB — POCT I-STAT 7, (LYTES, BLD GAS, ICA,H+H)
Acid-Base Excess: 3 mmol/L — ABNORMAL HIGH (ref 0.0–2.0)
Acid-Base Excess: 1 mmol/L (ref 0.0–2.0)
Bicarbonate: 27.5 mmol/L (ref 20.0–28.0)
Bicarbonate: 25.2 mmol/L (ref 20.0–28.0)
Calcium, Ion: 1.17 mmol/L (ref 1.15–1.40)
Calcium, Ion: 1.18 mmol/L (ref 1.15–1.40)
HCT: 25 % — ABNORMAL LOW (ref 39.0–52.0)
HCT: 28 % — ABNORMAL LOW (ref 39.0–52.0)
Hemoglobin: 8.5 g/dL — ABNORMAL LOW (ref 13.0–17.0)
Hemoglobin: 9.5 g/dL — ABNORMAL LOW (ref 13.0–17.0)
O2 Saturation: 99 %
O2 Saturation: 91 %
Patient temperature: 97.9
Patient temperature: 99.2
Potassium: 4.6 mmol/L (ref 3.5–5.1)
Potassium: 4.5 mmol/L (ref 3.5–5.1)
Sodium: 135 mmol/L (ref 135–145)
Sodium: 135 mmol/L (ref 135–145)
TCO2: 29 mmol/L (ref 22–32)
TCO2: 26 mmol/L (ref 22–32)
pCO2 arterial: 41.6 mmHg (ref 32.0–48.0)
pCO2 arterial: 36.9 mmHg (ref 32.0–48.0)
pH, Arterial: 7.426 (ref 7.350–7.450)
pH, Arterial: 7.443 (ref 7.350–7.450)
pO2, Arterial: 155 mmHg — ABNORMAL HIGH (ref 83.0–108.0)
pO2, Arterial: 60 mmHg — ABNORMAL LOW (ref 83.0–108.0)

## 2020-04-06 LAB — TYPE AND SCREEN
ABO/RH(D): O POS
Antibody Screen: NEGATIVE
Unit division: 0

## 2020-04-06 LAB — PREPARE FRESH FROZEN PLASMA

## 2020-04-06 LAB — RENAL FUNCTION PANEL
Albumin: 2.1 g/dL — ABNORMAL LOW (ref 3.5–5.0)
Albumin: 2.2 g/dL — ABNORMAL LOW (ref 3.5–5.0)
Anion gap: 8 (ref 5–15)
Anion gap: 10 (ref 5–15)
BUN: 32 mg/dL — ABNORMAL HIGH (ref 8–23)
BUN: 31 mg/dL — ABNORMAL HIGH (ref 8–23)
CO2: 25 mmol/L (ref 22–32)
CO2: 25 mmol/L (ref 22–32)
Calcium: 8.1 mg/dL — ABNORMAL LOW (ref 8.9–10.3)
Calcium: 8.4 mg/dL — ABNORMAL LOW (ref 8.9–10.3)
Chloride: 104 mmol/L (ref 98–111)
Chloride: 103 mmol/L (ref 98–111)
Creatinine, Ser: 1.8 mg/dL — ABNORMAL HIGH (ref 0.61–1.24)
Creatinine, Ser: 1.86 mg/dL — ABNORMAL HIGH (ref 0.61–1.24)
GFR calc Af Amer: 43 mL/min — ABNORMAL LOW (ref >60)
GFR calc Af Amer: 42 mL/min — ABNORMAL LOW (ref >60)
GFR calc non Af Amer: 37 mL/min — ABNORMAL LOW (ref >60)
GFR calc non Af Amer: 36 mL/min — ABNORMAL LOW (ref >60)
Glucose, Bld: 200 mg/dL — ABNORMAL HIGH (ref 70–99)
Glucose, Bld: 93 mg/dL (ref 70–99)
Phosphorus: 2.2 mg/dL — ABNORMAL LOW (ref 2.5–4.6)
Phosphorus: 3.6 mg/dL (ref 2.5–4.6)
Potassium: 4.7 mmol/L (ref 3.5–5.1)
Potassium: 4.6 mmol/L (ref 3.5–5.1)
Sodium: 137 mmol/L (ref 135–145)
Sodium: 138 mmol/L (ref 135–145)

## 2020-04-06 LAB — HEPATIC FUNCTION PANEL
ALT: 21 U/L (ref 0–44)
AST: 34 U/L (ref 15–41)
Albumin: 2.2 g/dL — ABNORMAL LOW (ref 3.5–5.0)
Alkaline Phosphatase: 129 U/L — ABNORMAL HIGH (ref 38–126)
Bilirubin, Direct: 0.3 mg/dL — ABNORMAL HIGH (ref 0.0–0.2)
Indirect Bilirubin: 0.5 mg/dL (ref 0.3–0.9)
Total Bilirubin: 0.8 mg/dL (ref 0.3–1.2)
Total Protein: 5.7 g/dL — ABNORMAL LOW (ref 6.5–8.1)

## 2020-04-06 LAB — CULTURE, BLOOD (ROUTINE X 2)
Culture: NO GROWTH
Culture: NO GROWTH
Special Requests: ADEQUATE
Special Requests: ADEQUATE

## 2020-04-06 LAB — GLUCOSE, CAPILLARY
Glucose-Capillary: 180 mg/dL — ABNORMAL HIGH (ref 70–99)
Glucose-Capillary: 173 mg/dL — ABNORMAL HIGH (ref 70–99)
Glucose-Capillary: 125 mg/dL — ABNORMAL HIGH (ref 70–99)
Glucose-Capillary: 112 mg/dL — ABNORMAL HIGH (ref 70–99)
Glucose-Capillary: 85 mg/dL (ref 70–99)
Glucose-Capillary: 114 mg/dL — ABNORMAL HIGH (ref 70–99)

## 2020-04-06 LAB — BPAM RBC
Blood Product Expiration Date: 202105212359
ISSUE DATE / TIME: 202104241358
Unit Type and Rh: 5100

## 2020-04-06 LAB — CBC
HCT: 27.5 % — ABNORMAL LOW (ref 39.0–52.0)
Hemoglobin: 8.4 g/dL — ABNORMAL LOW (ref 13.0–17.0)
MCH: 28.2 pg (ref 26.0–34.0)
MCHC: 30.5 g/dL (ref 30.0–36.0)
MCV: 92.3 fL (ref 80.0–100.0)
Platelets: 131 10*3/uL — ABNORMAL LOW (ref 150–400)
RBC: 2.98 MIL/uL — ABNORMAL LOW (ref 4.22–5.81)
RDW: 16.3 % — ABNORMAL HIGH (ref 11.5–15.5)
WBC: 8.8 10*3/uL (ref 4.0–10.5)
nRBC: 0.6 % — ABNORMAL HIGH (ref 0.0–0.2)

## 2020-04-06 LAB — PROTIME-INR
INR: 1.6 — ABNORMAL HIGH (ref 0.8–1.2)
Prothrombin Time: 19.3 seconds — ABNORMAL HIGH (ref 11.4–15.2)

## 2020-04-06 LAB — COOXEMETRY PANEL
Carboxyhemoglobin: 1.9 % — ABNORMAL HIGH (ref 0.5–1.5)
Methemoglobin: 1.1 % (ref 0.0–1.5)
O2 Saturation: 83.8 %
Total hemoglobin: 10.3 g/dL — ABNORMAL LOW (ref 12.0–16.0)

## 2020-04-06 LAB — LACTATE DEHYDROGENASE: LDH: 246 U/L — ABNORMAL HIGH (ref 98–192)

## 2020-04-06 LAB — MAGNESIUM: Magnesium: 2.6 mg/dL — ABNORMAL HIGH (ref 1.7–2.4)

## 2020-04-06 MED ORDER — WARFARIN SODIUM 1 MG PO TABS
1.5000 mg | ORAL_TABLET | Freq: Once | ORAL | Status: AC
  Administered 2020-04-06: 1.5 mg
  Filled 2020-04-06: qty 1

## 2020-04-06 MED ORDER — SODIUM PHOSPHATES 45 MMOLE/15ML IV SOLN
30.0000 mmol | Freq: Once | INTRAVENOUS | Status: AC
  Administered 2020-04-06: 08:00:00 30 mmol via INTRAVENOUS
  Filled 2020-04-06: qty 10

## 2020-04-06 NOTE — Plan of Care (Signed)
Assessed at bedside. Passing SBT on 5/5 for several hours, able to follow commands in all extremities. + cuff leak.  Proceed to extubation with BiPAP. May need precedex PRN for agitation.  Julian Hy, DO 04/06/20 1:14 PM [facility]

## 2020-04-06 NOTE — Progress Notes (Signed)
ANTICOAGULATION CONSULT NOTE  Pharmacy Consult for warfarin Indication: post op LVAD  / Aflutter  Allergies  Allergen Reactions  . Liraglutide Nausea And Vomiting  . Lisinopril Cough    Patient Measurements: Height: 6' (182.9 cm) Weight: 104.5 kg (230 lb 6.1 oz) IBW/kg (Calculated) : 77.6 Heparin Dosing Weight: 101.2 kg  Vital Signs: Temp: 97.7 F (36.5 C) (04/25 0800) Temp Source: Axillary (04/25 0800) BP: 80/60 (04/25 0829) Pulse Rate: 69 (04/25 0829)  Labs: Recent Labs    04/04/20 0332 04/04/20 9983 04/05/20 0323 04/05/20 0323 04/05/20 1548 04/05/20 1804 04/05/20 1804 04/06/20 0256 04/06/20 0312  HGB 7.7*   < > 7.6*   < >  --  8.2*   < > 8.4* 8.5*  HCT 24.7*   < > 25.1*   < >  --  27.0*  --  27.5* 25.0*  PLT 124*   < > 124*  --   --  120*  --  131*  --   LABPROT 25.9*  --  21.3*  --   --   --   --  19.3*  --   INR 2.4*  --  1.9*  --   --   --   --  1.6*  --   CREATININE 2.25*   < > 1.78*  --  1.79*  --   --  1.80*  --    < > = values in this interval not displayed.    Estimated Creatinine Clearance: 47.7 mL/min (A) (by C-G formula based on SCr of 1.8 mg/dL (H)).   Medical History: Past Medical History:  Diagnosis Date  . Anxiety   . Arthritis   . CAD (coronary artery disease)    a.  1993 s/p MI - [employer];  b. s/p BMS to LAD '00;  c. PTCA 2nd diagonal 2010;  d. 02/18/12 Cath: moderate nonobs dzs - med rx;  e.  01/2015 Cath: LM nl, LAD 40-80m ISR, 70-12m/d, d1 90p (3.0x16 Synergy DES), D2 50-60, LCX nl, OM1 50p, 74m (2.5x12 Synergy DES), RCA nl, EF 30-35%.  . Chronic combined systolic and diastolic CHF (congestive heart failure) ([place])    a. 12/2014 Echo: EF 30-35%, Gr2 DD, mod MR, sev dil LA.  . CKD (chronic kidney disease), stage III   . Depression   . ED (erectile dysfunction)   . GERD (gastroesophageal reflux disease)   . Hyperlipidemia   . Hypertension   . Ischemic cardiomyopathy    a. s/p [employer] [name] (Atlas) ICD implanted in [place] 2007;  b.  12/2014 Echo: Ef 30-35%.  . MVP (mitral valve prolapse)    a. s/p MV annuloplasty at [facility] 2004.  Marland Kitchen Persistent atrial fibrillation ([place])    a. noted on ICD interrogation '10 - not previously on [place] - CHA2DS2VASc = 5.  . Type II diabetes mellitus ([place])    uncontrolled    Medications:  Medications Prior to Admission  Medication Sig Dispense Refill Last Dose  . ALPRAZolam (XANAX) 0.25 MG tablet Take 0.25 mg by mouth 3 (three) times daily as needed for anxiety.    unknown  . amiodarone (PACERONE) 200 MG tablet Take 200 mg by mouth daily.    03/11/2020 at Unknown time  . apixaban (ELIQUIS) 5 MG TABS tablet Take 1 tablet (5 mg total) by mouth 2 (two) times daily. 60 tablet 1 03/11/2020 at 0600  . atorvastatin (LIPITOR) 40 MG tablet Take 1 tablet (40 mg total) by mouth daily at 6 PM. 30 tablet 0  03/10/2020 at Unknown time  . citalopram (CELEXA) 20 MG tablet Take 20 mg by mouth daily.    03/11/2020 at Unknown time  . furosemide (LASIX) 40 MG tablet Take 40 mg by mouth daily.    03/11/2020 at Unknown time  . insulin aspart (NOVOLOG FLEXPEN) 100 UNIT/ML FlexPen Inject 6 Units into the skin 3 (three) times daily with meals. 15 mL 0 03/11/2020 at Unknown time  . Insulin Glargine (BASAGLAR KWIKPEN) 100 UNIT/ML SOPN Inject 0.5 mLs (50 Units total) into the skin daily. 15 mL 1 03/11/2020 at Unknown time  . metoprolol succinate (TOPROL-XL) 100 MG 24 hr tablet Take 1 tablet (100 mg total) by mouth daily. 90 tablet 3 03/11/2020 at 0600  . nitroGLYCERIN (NITROSTAT) 0.4 MG SL tablet Place 1 tablet (0.4 mg total) under the tongue every 5 (five) minutes x 3 doses as needed for chest pain. 10 tablet 0 unknown  . pantoprazole (PROTONIX) 40 MG tablet Take 1 tablet (40 mg total) by mouth daily. 30 tablet 1 03/11/2020 at Unknown time  . potassium chloride SA (KLOR-CON) 20 MEQ tablet Take 20 mEq by mouth daily.    03/11/2020 at Unknown time  . tamsulosin (FLOMAX) 0.4 MG CAPS capsule Take 1 capsule (0.4 mg total) by mouth  daily. 30 capsule 0 03/11/2020 at Unknown time  . ALPRAZolam (XANAX) 1 MG tablet Take 1 tablet (1 mg total) by mouth 2 (two) times daily as needed for anxiety. (Patient not taking: Reported on 02/28/2020) 10 tablet 0 Not Taking at Unknown time    Assessment: 35 YOM with atrial fibrillation on Eliquis PTA s/p R & L heart cath found to have 3v disease s/p  LVAD  Implant 4/8. Warfarin initiated on 4/9 after discussing with MD.  CT on 4/15 now found to have large right MCA stroke. INR had been elevated up to 9.0 postop, he received 1 dose of vitamin K 1 mg IV on 4/13 and again on 4/15 with INR still elevated at 6.2.   Warfarin resumed cautiously on 4/17. Held doses since 4/20 since he went to the OR for VATS. Warfarin was cautiously resumed on 4/23.  INR is now below goal at 1.6. Hgb stable 8.4. Pltc stable 127. LDH down 246. No bleeding noted.   Goal of Therapy:  INR 2-2.5 Monitor platelets by anticoagulation protocol: Yes   Plan:  -Warfarin 1.5 mg x1 today -Daily INR and CBC  Vertis Kelch, PharmD, [facility] PGY2 Cardiology Pharmacy Resident Phone [phone] 04/06/2020       8:53 AM  Please check [email or web address] for unit-specific pharmacist phone numbers

## 2020-04-06 NOTE — Progress Notes (Signed)
Patient ID: [name] [name], male   DOB: January 18, 1951, 69 y.o.   MRN: [id number]    Advanced Heart Failure Rounding Note   Subjective:    - S/p HM3 VAD 4/8 w MAZE procedure + LAA clipping. - Extubated 4/9 - 4/12 LVAD speed increased to 5400 --> echo moderate-severe RV dysfunction, septum mildly left shifted, trivial pericardial effusion.  LVAD speed cut back to 5300.  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  - 4/20 developed hemothorax requiring emergent CT placement and eventual VATS - 4/21 EEG done given concerns for posturing and c/w moderate to severe diffuse encephalopathy. No seizures or epileptiform discharges were seen  - 04/03/20 Pump Stop --> controller change out. CT of head unchanged R MCA CVA  On low-dose Precadex. Awake on vent. Moves all 4.   Remains on CVVHD. Pulling -75/hr CVP 9 (Checked personally). Weight down 3 pounds  Off VP. On NE 11. Milrinone 0.25  < 50cc of drainage from CT   INR 1.9 -> 1.6 . Hgb 8.4  CXR with moderate left effusion Personally reviewed  VAD Interrogation  Flow 4.4, Speed 5350, Power 3.7, Pulse Index 3.2  VAD interrogated personally. Parameters stable.  Objective:   Weight Range:  Vital Signs:   Temp:  [96.3 F (35.7 C)-98.3 F (36.8 C)] 96.5 F (35.8 C) (04/25 1134) Pulse Rate:  [63-120] 84 (04/25 1212) Resp:  [13-25] 15 (04/25 1212) BP: (79-116)/(59-100) 89/59 (04/25 1212) SpO2:  [94 %-100 %] 95 % (04/25 1200) Arterial Line BP: (77-117)/(60-79) 93/60 (04/25 1200) FiO2 (%):  [40 %] 40 % (04/25 1213) Last BM Date: 04/06/20  Weight change: Filed Weights   04/03/20 0500 04/04/20 0300 04/05/20 0500  Weight: 106.7 kg 105.9 kg 104.5 kg    Intake/Output:   Intake/Output Summary (Last 24 hours) at 04/06/2020 1216 Last data filed at 04/06/2020 1200 Gross per 24 hour  Intake 4189.55 ml  Output 6513 ml  Net -2323.45 ml       Physical Exam: General:  Awake on vent. Following commands  HEENT: normal  +  ETT Neck: supple. JVP 9 Carotids 2+ bilat; no bruits. No lymphadenopathy or thryomegaly appreciated. Cor: LVAD hum.  Lungs: Clear. + left chest tube  Dull at left base Abdomen: obese soft, nontender, non-distended. No hepatosplenomegaly. No bruits or masses. Good bowel sounds. Driveline site clean. Anchor in place.  Extremities: no cyanosis, clubbing, rash. Warm no edema  Neuro: awake on vent. Moves all 4   Telemetry: A fib 80s Personally reviewed   Labs: Basic Metabolic Panel: Recent Labs  Lab 04/02/20 0429 04/02/20 1620 04/03/20 0329 04/03/20 1607 04/04/20 0332 04/04/20 0337 04/04/20 1610 04/04/20 1610 04/05/20 0323 04/05/20 1548 04/06/20 0256 04/06/20 0312  NA 138   < > 136   < > 139   < > 136  --  138 135 137 135  K 5.5*   < > 5.3*   < > 4.9   < > 4.9  --  5.2* 5.0 4.7 4.6  CL 103   < > 102   < > 103  --  102  --  105 102 104  --   CO2 24   < > 25   < > 25  --  26  --  26 25 25   --   GLUCOSE 212*   < > 255*   < > 188*  --  162*  --  156* 240* 200*  --   BUN  45*   < > 37*   < > 35*  --  33*  --  30* 30* 32*  --   CREATININE 3.61*   < > 2.53*   < > 2.25*  --  2.05*  --  1.78* 1.79* 1.80*  --   CALCIUM 7.9*   < > 7.9*   < > 8.2*   < > 8.1*   < > 8.4* 8.2* 8.1*  --   MG 2.5*  --  2.7*  --  2.7*  --   --   --  2.6*  --  2.6*  --   PHOS 5.2*   < > 4.0   < > 3.2  --  3.1  --  2.7 2.2* 2.2*  --    < > = values in this interval not displayed.    Liver Function Tests: Recent Labs  Lab 04/02/20 0429 04/02/20 1620 04/03/20 0329 04/03/20 1607 04/04/20 0332 04/04/20 1610 04/05/20 0323 04/05/20 1548 04/06/20 0256  AST 35  --  37  --  35  --  33  --  34  ALT 20  --  22  --  22  --  23  --  21  ALKPHOS 78  --  119  --  112  --  142*  --  129*  BILITOT 1.4*  --  0.8  --  0.9  --  0.6  --  0.8  PROT 5.2*  --  5.2*  --  5.6*  --  5.9*  --  5.7*  ALBUMIN 2.6*  2.6*   < > 2.3*  2.2*   < > 2.2*  2.1* 2.3* 2.1*  2.2* 2.0* 2.1*  2.2*   < > = values in this interval not  displayed.   No results for input(s): LIPASE, AMYLASE in the last 168 hours. No results for input(s): AMMONIA in the last 168 hours.  CBC: Recent Labs  Lab 03/31/20 0339 03/31/20 0425 03/27/2020 0410 04/07/2020 0263 04/02/20 0429 04/03/20 7858 04/03/20 0329 04/03/20 0329 04/04/20 0332 04/04/20 0332 04/04/20 0337 04/05/20 0323 04/05/20 1804 04/06/20 0256 04/06/20 0312  WBC 13.3*   < > 19.5*   < > 26.9*  --  18.5*  --  13.1*  --   --  10.7* 7.8 8.8  --   NEUTROABS 11.4*  --  16.9*  --  24.0*  --   --   --   --   --   --   --   --   --   --   HGB 9.3*   < > 8.5*   < > 8.8*   < > 7.7*   < > 7.7*   < > 7.8* 7.6* 8.2* 8.4* 8.5*  HCT 28.7*   < > 27.0*   < > 27.1*   < > 24.4*   < > 24.7*   < > 23.0* 25.1* 27.0* 27.5* 25.0*  MCV 92.9   < > 92.5   < > 87.7  --  92.1  --  94.3  --   --  95.1 94.1 92.3  --   PLT 134*   < > 153   < > 129*  --  108*  --  124*  --   --  124* 120* 131*  --    < > = values in this interval not displayed.    Cardiac Enzymes: No results for input(s): CKTOTAL, CKMB, CKMBINDEX, TROPONINI in the last 168 hours.  BNP: BNP (  last 3 results) Recent Labs    03/21/20 0209 03/26/20 2329 04/02/20 2342  BNP 315.9* 425.7* 332.7*    ProBNP (last 3 results) No results for input(s): PROBNP in the last 8760 hours.    Other results:  Imaging: DG CHEST PORT 1 VIEW  Result Date: 04/05/2020 CLINICAL DATA:  Chest tube in place EXAM: PORTABLE CHEST 1 VIEW COMPARISON:  April 04, 2020 FINDINGS: The ETT is in good position. The NG tube terminates below today's film. Stable right IJ sheath. Stable left chest tubes. No pneumothorax. Stable opacity in left base. Stable left ventricular assist device. Mild opacity in the right base. Stable opacity in left mid lung. IMPRESSION: 1. Support apparatus as above. 2. Mild increased opacity in the right base is nonspecific but could represent developing pneumonia or aspiration. Atelectasis possible. 3. Stable opacity in left perihilar  region. 4. Stable left retrocardiac opacity. 5. No other change. Electronically Signed   By: Dorise Bullion III M.D   On: 04/05/2020 12:05     Medications:     Scheduled Medications: . sodium chloride   Intravenous Once  . aspirin  81 mg Per Tube Daily  . atorvastatin  80 mg Per Tube q1800  . B-complex with vitamin C  1 tablet Per Tube Daily  . bisacodyl  10 mg Oral Daily   Or  . bisacodyl  10 mg Rectal Daily  . chlorhexidine gluconate (MEDLINE KIT)  15 mL Mouth Rinse BID  . Chlorhexidine Gluconate Cloth  6 each Topical Daily  . docusate  200 mg Per Tube Daily  . feeding supplement (PRO-STAT SUGAR FREE 64)  30 mL Per Tube QID  . influenza vaccine adjuvanted  0.5 mL Intramuscular Tomorrow-1000  . insulin aspart  0-24 Units Subcutaneous Q4H  . insulin aspart  6 Units Subcutaneous Q4H  . insulin glargine  24 Units Subcutaneous BID  . linezolid  600 mg Per Tube Q12H  . mouth rinse  15 mL Mouth Rinse Q2H  . melatonin  3 mg Per Tube QHS  . metoCLOPramide (REGLAN) injection  5 mg Intravenous Q8H  . pantoprazole (PROTONIX) IV  40 mg Intravenous Daily  . polyethylene glycol  17 g Per Tube Daily  . QUEtiapine  25 mg Per Tube QHS  . sildenafil  20 mg Per Tube TID  . sodium chloride flush  10-40 mL Intracatheter Q12H  . warfarin  1.5 mg Per Tube ONCE-1600  . Warfarin - Pharmacist Dosing Inpatient   Does not apply q1600    Infusions: .  prismasol BGK 4/2.5 500 mL/hr at 04/06/20 0253  . sodium chloride Stopped (03/22/20 2011)  . sodium chloride Stopped (03/27/20 1523)  . sodium chloride    . sodium chloride 10 mL/hr at 04/05/20 1815  . sodium chloride    . amiodarone 30 mg/hr (04/06/20 1200)  . ceFEPime (MAXIPIME) IV Stopped (04/06/20 1030)  . dexmedetomidine (PRECEDEX) IV infusion Stopped (04/06/20 1157)  . epinephrine Stopped (04/05/20 0854)  . feeding supplement (VITAL 1.5 CAL) 50 mL/hr at 04/06/20 0700  . lactated ringers Stopped (03/29/20 0748)  . milrinone 0.25 mcg/kg/min  (04/06/20 1200)  . norepinephrine (LEVOPHED) Adult infusion 11 mcg/min (04/06/20 1200)  . prismasol BGK 0/2.5 300 mL/hr at 04/05/20 1312  . prismasol BGK 4/2.5 1,500 mL/hr at 04/06/20 1005  . sodium phosphate  Dextrose 5% IVPB 43 mL/hr at 04/06/20 1200  . vasopressin (PITRESSIN) infusion - *FOR SHOCK* Stopped (04/06/20 0507)    PRN Medications: sodium chloride, acetaminophen, dextrose, fentaNYL (SUBLIMAZE)  injection, heparin, heparin, hydrALAZINE, HYDROcodone-acetaminophen, levalbuterol, ondansetron (ZOFRAN) IV, ondansetron (ZOFRAN) IV, oxyCODONE, sodium chloride, sodium chloride flush, sorbitol, traMADol   Assessment/Plan:   1. Acute on chronic systolic HF -> cardiogenic shock-> S/p HM3 LVAD - Echo 2016 EF 30-35% - Echo 1/21 EF 25% - Admitted with NYHA IV symptoms and AKI with attempts at diuresis.  - Echo this admit: EF 10-15% moderate RV dysfunction - R/LHC cath 3/21 with severe 3v CAD and low output with CI 1.7.  - Swan placed. Initial co-ox 39%. Initial PAPi 1.5. Started on milrinone 0.25. - Initially planned for CABG but given need for re-do sternotomy, relatively poor targets and longstanding low EF, VAD felt to be better option  - S/p HM3 VAD 4/8 - Speed turned up on 4/11 to  5400, but looking at 4/10 echo there is significant RV dysfunction with leftward septal shift so speed decreased back to 5300 rpm.  - Developed hemothorax on 4/20 requiring emergent chest tube and VATS - On NE 11 + Milrinone 0.25. Off NE & vaso Co-ox 84% - Volume status improving on CVVHD at -75 weight down 3 pounds to 230. (pre-op 227, post VAD was down to 220)  2. VAD - VAD interrogated personally. Parameters stable. - Had Pump Stop 04/03/20. Controller changed out with old controller sent back to [facility] for evaluation.  - on aspirin 81 mg daily + coumadin.  - LDH stable at 246 - INR  1.6. Discussed dosing with PharmD personally. - Driveline ok   3. CAD with unstable angina - s/p previous PCI -  cath 03/05/2020 with severe 3v CAD - now s/p VAD on 03/25/2020    4. AKI on CKD 3a - Nephrology consulted. Started CVVHD 4/13  - Renal US 4/5 unremarkable.  - CVP ~9 . Continue CVVHD -75.  - Hopefully renal function will recover but Renal team not optimistic  5. Permanent AF - now s/p MAZE + LAA Clipping 4/8   - Rate controlled - Continue IV amio while on high-dose pressors - Warfarin as above  6. MVP s/p MV repair - On admit with recurrent severe MR on echo and with huge v-waves on PCWP tracing - s/p VAD  7. Hemothoax - s/p CT placement and VATs on 4/22 - CTs minimal output - CXR still with moderate effusion - Will d/w TCTS about pulling   8. ID/leukocytosis - 4/11 procalcitonin 1.96  - Started empiric coverage for PNA with vancomycin/cefepime for 7 days. Stopped 4/17 - Given recent VATs and high-dose pressor requirement will cover with broad spectrum abx for now, linezolid and cefepime. WBC down to 8.8 - Can likely stop abx soon  9. Acute Hypoxic Respiratory Failure - Re-intubated 4/20 with hemothorax- Minimal output from CT - Failed SBT yesterday - Improving. CCM following for possible extubation today  10. Malnutrition  - Albumin 2.9   - On tube feeds.    11. CVA/anoxic brain injury - 03/27/20 CT large posterior MCA ischemic infarct, possibly from atrial fibrillation.  INR was supratherapeutic when CVA found.  Stable LDH, do not think partial pump thrombosis is the culprit. - had hypotension with events on 4/20.  RN concern over posturing. EEG 4/21 demonstrated moderate to severe diffuse encephalopathy. No seizures or epileptiform discharges were seen   12. Anemia, post-op  - hgb 7.6 -> 8.4 after 1u RBC 04/05/20  13. Hyperkalemia - Improved 4.7 today. continue CVVHD  14. DM2, poorly controlled - hgbA1c 11.1% - insulin on hold due to hypoglycemia  Length of Stay: 25   CRITICAL CARE Performed by: Glori Bickers  Total critical care time: 35  minutes  Critical care time was exclusive of separately billable procedures and treating other patients.  Critical care was necessary to treat or prevent imminent or life-threatening deterioration.  Critical care was time spent personally by me (independent of midlevel providers or residents) on the following activities: development of treatment plan with patient and/or surrogate as well as nursing, discussions with consultants, evaluation of patient's response to treatment, examination of patient, obtaining history from patient or surrogate, ordering and performing treatments and interventions, ordering and review of laboratory studies, ordering and review of radiographic studies, pulse oximetry and re-evaluation of patient's condition.     Glori Bickers MD   04/06/2020, 12:16 PM  Advanced Heart Failure Team Pager [phone] (M-F; 7a - 4p)  Please contact [facility] for night-coverage after hours (4p -7a ) and weekends on [email or web address]

## 2020-04-06 NOTE — Procedures (Signed)
Extubation Procedure Note  Patient Details:   Name: [name] [name] DOB: 1951/04/22 MRN: [id number]   Airway Documentation:    Vent end date: 04/06/20 Vent end time: 1330   Evaluation  O2 sats: stable throughout Complications: No apparent complications Patient did tolerate procedure well. Bilateral Breath Sounds: Diminished   Yes   Patient extubated to [name]. Vital signs stable at this time. No complications. RT will continue to monitor.  Mcneil Sober 04/06/2020, 1:37 PM

## 2020-04-06 NOTE — Progress Notes (Signed)
PULMONARY / CRITICAL CARE MEDICINE   NAME:  [name] [name], MRN:  [id number], DOB:  04/27/1951, LOS: 57 ADMISSION DATE:  02/14/2020, CONSULTATION DATE:  03/27/20 REFERRING MD:  Aundra Dubin, CHIEF COMPLAINT:  Respiratory failure  BRIEF HISTORY:    69 year old man with advanced ischemic cardiomyopathy, prior mitral valve repair, afib presented for redo sternotomy, LVAD implantation, left atrial appendage clipping, and sternal reconstruction on 03/29/2020.  Hospitalization complicated by renal failure, RV dysfunction, persistent delirium, and R MCA stroke.  Respiratory status has been tenuous.  On the morning of 4/20, patient became agonal with low flow on LVAD followed by intubation and two rounds of epi.  PCCM re-consulted.  HISTORY OF PRESENT ILLNESS   69 year old man with advanced ischemic cardiomyopathy, prior mitral valve repair, afib presented for redo sternotomy, LVAD implantation, left atrial appendage clipping, and sternal reconstruction on 03/13/2020.    Postoperative course complicated by persistent delirium, RV dysfunction, renal failure, abnormal INR of unclear etiology.  Started on CRRT 4/13.  Patient less responsive.  Found on CT head 4/15 with acute posterior R MCA stroke.  Neurology following.  Respiratory status has been tenuous post op as well in which PCCM initially consulted for.  Has remained mostly on BIPAP.  On 4/16, on 5 L [place] and saturating well. He is alert and oriented to self today (4/16).  PCCM available on as needed basis.  On the morning of 4/20, patient became agonal with low flow on LVAD followed by intubation and two rounds of epi.  PCCM re-consulted.  SIGNIFICANT PAST MEDICAL HISTORY   Ischemic cardiomyopathy MV prolapse s/p repair in 2004 HTN HLD CKD  SIGNIFICANT EVENTS:  4/8: redo sternotomy, LVAD implantation, left atrial appendage clipping, and sternal reconstruction  4/9: extubated 4/13 CRRT started  4/15 R MCA CVA on [facility] 4/16 off Bipap, on 5L [place] 4/19 CRRT  stopped, respiratory arrest, intubated, chest tube placed for L hemothorax  4/20 L VATS with 2 pleural drains placed   STUDIES:   CT Head 03/27/20 IMPRESSION: Large posterior right MCA infarct, new since prior study.  CT a/p 03/16/2020 IMPRESSION: 1. No acute intra-abdominal or pelvic pathology. No bowel obstruction. Normal appendix. 2. Cirrhosis with small perihepatic ascites. 3. Cholelithiasis. No evidence of acute cholecystitis by CT. 4. Aortic Atherosclerosis (ICD10-I70.0).  4/15 CXR volume overload  Echo 4/10 1. Images are poor quality and cannot comment on global LV function. .  Left ventricular ejection fraction, by estimation, is 20 to 25%. The left  ventricle has severely decreased function. The left ventricle demonstrates  global hypokinesis. There is  moderate concentric left ventricular hypertrophy.  2. Aortic dilatation noted. There is mild dilatation of the ascending  aorta and of the aortic root measuring 41 mm and 74mm respectively.  3. Right ventricular systolic function is severely reduced. The right  ventricular size is moderately enlarged.  4. The aortic valve is tricuspid. Mild aortic valve sclerosis is present,  with no evidence of aortic valve stenosis.   CULTURES:  COVID swab neg on admission MRSA PCR neg 4/7 Blood cultures x 2 4/13 neg Urine culture 4/12 neg  Bcx 4/20>  Respiratory cx 4/20>   ANTIBIOTICS:  Vanc 4/12 - 4/17 Cefepime 4/11 - 4/17 Linezolid 4/20 >  Cefepime 4/20>   LINES/TUBES:  4/8: redo sternotomy, LVAD implantation, left atrial appendage clipping, and sternal reconstruction   RIJ introducer >> R [place] HD catheter >> R radial A line >> out R/L pleural drains  >> out  4/20 ETT >> 4/20 R radial aline >> 4/20 left chest tube, 2 pleural drains >>  CONSULTANTS:  CHF, TCTS, PCCM, Nephro, Neuro, Hematology  SUBJECTIVE:  No acute events overnight. Off epinephrine gtt. Continues to have diarrhea.   CONSTITUTIONAL: BP  105/86   Pulse 71   Temp (!) 96.8 F (36 C) (Axillary)   Resp 18   Ht 6' (1.829 m)   Wt 104.5 kg   SpO2 100%   BMI 31.25 kg/m   I/O last 3 completed shifts: In: 5652.8 [I.V.:2736.3; Blood:630; NG/GT:1940; IV Piggyback:346.5] Out: 8601 [Other:8051; Stool:550]  CVP:  [6 mmHg-20 mmHg] 10 mmHg  Vent Mode: PRVC FiO2 (%):  [40 %] 40 % Set Rate:  [18 bmp] 18 bmp Vt Set:  [676 mL] 620 mL PEEP:  [5 cmH20] 5 cmH20 Pressure Support:  [10 cmH20] 10 cmH20 Plateau Pressure:  [21 cmH20-23 cmH20] 23 cmH20  PHYSICAL EXAM: General: chronically ill appearing male, somnolent while on sedation  HEENT: NCAT, ETT in place, CorTrack in place, RIJ CVC, [facility] HD cath Neuro: somnolent while on sedation, does not arouse to verbal or sternal stimulus  PULM: coarse breath sounds, ronchi on the R, absent sounds on L base, L chest tube no output  CV: LVAD hum  GI: soft, NTND, normoactive bowel sounds  Extremities: anasarca  Skin: warm   RESOLVED PROBLEM LIST    ASSESSMENT AND PLAN    # Acute hypoxic respiratory failure secondary to L hemothorax s/p L CT placement and VATS 4/20 L chest tube without leak >>  minimal drainage -- Tolerating SBT this AM though somnolent for me on exam. Will reassess later today for potential extubation  -- CXR 4/25 >>  RLL opacity appears increased and L appears stable. Will await formal read.  -- Remains anasarcic on exam --> Cont' CVHD for fluid removal  -- Minimize sedation -- PAD protocol  -- VAP precautions in place   -- L Chest tube and 2 pleural drain in place with minimal output  -- Bcx, collected 4/20, negative and trach aspirate with rare gram variable rods  -- Palliative care involved and updating family  # Cardiogenic shock   # Suspected PEA arrest  # ICM s/p LVAD placement 4/8 for destination therapy  - LVAD settings are stable -- No obvious tamponade physiology on bedside TTE by Dr. Haroldine Laws  -- On norepi, and vaso gtt. Epi off, 4/24 -- Co-ox 83  on 4/25 am -- Trend H/H -- LVAD Management per TCT/CHF team -- On ASA and Coumadin today  -- INR goal 2-3    # Right MCA cardioembolic stroke, suspected secondary to Afib  # Seizure like-activity  -- Neuro signed off 4/17. Recommended continuing AC with ASA and warfarin  -- Patient noted to have rhythmic movements of bilateral UE 4/21. EEG showed moderate to severe diffuse encephalopathy, but no seizures or epileptiform discharges. -- Continued Neuro exams per unit protocol   # Chronic afiblled -- S/p maze and LAA clipping 4/8 -- On Coumadin per pharmacy  -- On amiodarone drip    # Acute renal failure on CKD  -- Nephrology following  -- On CRRT 4/19, -2L in last 24hr  -- Remains anuric -- Trend renal panel/ mag daily   # DM type II uncontrolled  -- SSI and Lantus  # Large Volume Diarrhea likely side-effect of antibiotic therapy  -- No fever>> WBC is down trending>> low suspicion C diff -- Hold off laxatives  -- Immodium PRN  -- Dietitian  to eval for change in TF  # GOC:  -- Palliative care following   -- Daughter brought HCPOA papers 4/24   Best Practice / Goals of Care / Disposition.   Diet: TF  Pain/Anxiety/Delirium protocol (if indicated): precedex gtt VAP protocol (if indicated): yes DVT prophylaxis: warfarin, SCD  GI prophylaxis: PPI Glucose control: SSI Mobility: BR Code Status: partial- no compression given LVAD Family Communication: per primary Disposition: ICU  LABS  Glucose Recent Labs  Lab 04/05/20 1007 04/05/20 1154 04/05/20 1552 04/05/20 1940 04/05/20 2329 04/06/20 0319  GLUCAP 77 109* 222* 180* 120* 173*    BMET Recent Labs  Lab 04/05/20 0323 04/05/20 0323 04/05/20 1548 04/06/20 0256 04/06/20 0312  NA 138   < > 135 137 135  K 5.2*   < > 5.0 4.7 4.6  CL 105  --  102 104  --   CO2 26  --  25 25  --   BUN 30*  --  30* 32*  --   CREATININE 1.78*  --  1.79* 1.80*  --   GLUCOSE 156*  --  240* 200*  --    < > = values in this  interval not displayed.    Liver Enzymes Recent Labs  Lab 04/04/20 0332 04/04/20 1610 04/05/20 0323 04/05/20 1548 04/06/20 0256  AST 35  --  33  --  34  ALT 22  --  23  --  21  ALKPHOS 112  --  142*  --  129*  BILITOT 0.9  --  0.6  --  0.8  ALBUMIN 2.2*  2.1*   < > 2.1*  2.2* 2.0* 2.1*  2.2*   < > = values in this interval not displayed.    Electrolytes Recent Labs  Lab 04/04/20 0332 04/04/20 1610 04/05/20 0323 04/05/20 1548 04/06/20 0256  CALCIUM 8.2*   < > 8.4* 8.2* 8.1*  MG 2.7*  --  2.6*  --  2.6*  PHOS 3.2   < > 2.7 2.2* 2.2*   < > = values in this interval not displayed.    CBC Recent Labs  Lab 04/05/20 0323 04/05/20 0323 04/05/20 1804 04/06/20 0256 04/06/20 0312  WBC 10.7*  --  7.8 8.8  --   HGB 7.6*   < > 8.2* 8.4* 8.5*  HCT 25.1*   < > 27.0* 27.5* 25.0*  PLT 124*  --  120* 131*  --    < > = values in this interval not displayed.    ABG Recent Labs  Lab 04/03/20 0318 04/04/20 0337 04/06/20 0312  PHART 7.382 7.385 7.426  PCO2ART 46.6 47.2 41.6  PO2ART 108 149* 155*    Coag's Recent Labs  Lab 04/04/20 0332 04/05/20 0323 04/06/20 0256  INR 2.4* 1.9* 1.6*    Sepsis Markers No results for input(s): LATICACIDVEN, PROCALCITON, O2SATVEN in the last 168 hours.  Cardiac Enzymes No results for input(s): TROPONINI, PROBNP in the last 168 hours.

## 2020-04-06 NOTE — Procedures (Signed)
Admit: 03/11/2020 LOS: 59  53M dialysis dependent AKI on CRRT, acute CHF exacerbation s/p VAD, status post MV repair, AFib s/p MAZE and LAA clipping 4/8; R MCA CVA 4/15; s/p hemothorax and VATS 4/20  Current CRRT Prescription: Start Date: 4/13 Catheter: L [place] Temp HD cath BFR: 200 Pre Blood Pump: 500 4K DFR: 1500 4K Replacement Rate: 300 0K Goal UF: 83m/h net neg Anticoagulation: systemic anticoagulation on hold after hemothorax Clotting: acceptable; warfarin   S:  NE, VP, milrinone; off Epi  No UOP  Following commands intermittently  K 4.7, P 2.2 this AM   O: 04/24 0701 - 04/25 0700 In: 4019.5 [I.V.:2113.1; Blood:630; NG/GT:1030; IV Piggyback:246.4] Out: 6371 [Emesis/NG output:50; Stool:100; Chest Tube:20]  Filed Weights   04/03/20 0500 04/04/20 0300 04/05/20 0500  Weight: 106.7 kg 105.9 kg 104.5 kg    Recent Labs  Lab 04/05/20 0323 04/05/20 0323 04/05/20 1548 04/06/20 0256 04/06/20 0312  NA 138   < > 135 137 135  K 5.2*   < > 5.0 4.7 4.6  CL 105  --  102 104  --   CO2 26  --  25 25  --   GLUCOSE 156*  --  240* 200*  --   BUN 30*  --  30* 32*  --   CREATININE 1.78*  --  1.79* 1.80*  --   CALCIUM 8.4*  --  8.2* 8.1*  --   PHOS 2.7  --  2.2* 2.2*  --    < > = values in this interval not displayed.   Recent Labs  Lab 03/31/20 0339 03/31/20 0425 03/30/2020 0410 04/03/2020 0512 04/02/20 0429 04/03/20 0318 04/05/20 0323 04/05/20 0323 04/05/20 1804 04/06/20 0256 04/06/20 0312  WBC 13.3*   < > 19.5*   < > 26.9*   < > 10.7*  --  7.8 8.8  --   NEUTROABS 11.4*  --  16.9*  --  24.0*  --   --   --   --   --   --   HGB 9.3*   < > 8.5*   < > 8.8*   < > 7.6*   < > 8.2* 8.4* 8.5*  HCT 28.7*   < > 27.0*   < > 27.1*   < > 25.1*   < > 27.0* 27.5* 25.0*  MCV 92.9   < > 92.5   < > 87.7   < > 95.1  --  94.1 92.3  --   PLT 134*   < > 153   < > 129*   < > 124*  --  120* 131*  --    < > = values in this interval not displayed.    Scheduled Meds: . sodium chloride    Intravenous Once  . aspirin  81 mg Per Tube Daily  . atorvastatin  80 mg Per Tube q1800  . B-complex with vitamin C  1 tablet Per Tube Daily  . bisacodyl  10 mg Oral Daily   Or  . bisacodyl  10 mg Rectal Daily  . chlorhexidine gluconate (MEDLINE KIT)  15 mL Mouth Rinse BID  . Chlorhexidine Gluconate Cloth  6 each Topical Daily  . docusate  200 mg Per Tube Daily  . feeding supplement (PRO-STAT SUGAR FREE 64)  30 mL Per Tube QID  . influenza vaccine adjuvanted  0.5 mL Intramuscular Tomorrow-1000  . insulin aspart  0-24 Units Subcutaneous Q4H  . insulin aspart  6 Units Subcutaneous Q4H  .  insulin glargine  24 Units Subcutaneous BID  . linezolid  600 mg Per Tube Q12H  . mouth rinse  15 mL Mouth Rinse Q2H  . melatonin  3 mg Per Tube QHS  . metoCLOPramide (REGLAN) injection  5 mg Intravenous Q8H  . pantoprazole (PROTONIX) IV  40 mg Intravenous Daily  . polyethylene glycol  17 g Per Tube Daily  . QUEtiapine  25 mg Per Tube QHS  . sildenafil  20 mg Per Tube TID  . sodium chloride flush  10-40 mL Intracatheter Q12H  . warfarin  1.5 mg Per Tube ONCE-1600  . Warfarin - Pharmacist Dosing Inpatient   Does not apply q1600   Continuous Infusions: .  prismasol BGK 4/2.5 500 mL/hr at 04/06/20 0253  . sodium chloride Stopped (03/22/20 2011)  . sodium chloride Stopped (03/27/20 1523)  . sodium chloride    . sodium chloride 10 mL/hr at 04/05/20 1815  . sodium chloride    . amiodarone 30 mg/hr (04/06/20 0900)  . ceFEPime (MAXIPIME) IV Stopped (04/05/20 2153)  . dexmedetomidine (PRECEDEX) IV infusion 0.4 mcg/kg/hr (04/06/20 0906)  . epinephrine Stopped (04/05/20 0854)  . feeding supplement (VITAL 1.5 CAL) 50 mL/hr at 04/06/20 0700  . lactated ringers Stopped (03/29/20 0748)  . milrinone 0.25 mcg/kg/min (04/06/20 0907)  . norepinephrine (LEVOPHED) Adult infusion 8 mcg/min (04/06/20 0900)  . prismasol BGK 0/2.5 300 mL/hr at 04/05/20 1312  . prismasol BGK 4/2.5 1,500 mL/hr at 04/06/20 6203  .  sodium phosphate  Dextrose 5% IVPB 43 mL/hr at 04/06/20 0900  . vasopressin (PITRESSIN) infusion - *FOR SHOCK* Stopped (04/06/20 0507)   PRN Meds:.sodium chloride, acetaminophen, dextrose, fentaNYL (SUBLIMAZE) injection, heparin, heparin, hydrALAZINE, HYDROcodone-acetaminophen, levalbuterol, ondansetron (ZOFRAN) IV, ondansetron (ZOFRAN) IV, oxyCODONE, sodium chloride, sodium chloride flush, sorbitol, traMADol  ABG    Component Value Date/Time   PHART 7.426 04/06/2020 0312   PCO2ART 41.6 04/06/2020 0312   PO2ART 155 (H) 04/06/2020 0312   HCO3 27.5 04/06/2020 0312   TCO2 29 04/06/2020 0312   ACIDBASEDEF 2.0 04/07/2020 1405   O2SAT 99.0 04/06/2020 0312    A/P  1. Anuric Dialysis dependent AKI on CRRT; 4K pre/D and 0K post, no circuit anticoagluation; gentle UF 2. Hypophosphatemia, replete prn for P < 2.5 3. sCHF s/p LVAD 4/8 4. Hemothorax s/p VATS 04/11/2020 5. AFib s/p MAZE and LAA clipping 4/8 6. DM2 7. R MCA ischemic CVA 8. SHock on, NE and VP, on milrinone 9. Anemia  Cont CRRT, keep post bath 0K unles K <4.5  Pearson Grippe, MD [employer]

## 2020-04-06 NOTE — Progress Notes (Signed)
SLP Cancellation Note  Patient Details Name: ALDYN TOON MRN: [id number] DOB: 1951-08-14   Cancelled treatment:       Reason Eval/Treat Not Completed: Medical issues which prohibited therapy. SLP order received. Pt is currently still on the vent. Will follow up for evaluation post-extubation.    Shayley Medlin L. Tivis Ringer, [place] CCC/SLP Acute Rehabilitation Services Office number [phone] Pager [phone]  Juan Quam Laurice 04/06/2020, 1:34 PM

## 2020-04-07 ENCOUNTER — Inpatient Hospital Stay (HOSPITAL_COMMUNITY): Payer: Medicare HMO

## 2020-04-07 DIAGNOSIS — J9601 Acute respiratory failure with hypoxia: Secondary | ICD-10-CM | POA: Diagnosis not present

## 2020-04-07 DIAGNOSIS — Z8674 Personal history of sudden cardiac arrest: Secondary | ICD-10-CM | POA: Diagnosis not present

## 2020-04-07 DIAGNOSIS — R570 Cardiogenic shock: Secondary | ICD-10-CM | POA: Diagnosis not present

## 2020-04-07 LAB — CBC
HCT: 28.4 % — ABNORMAL LOW (ref 39.0–52.0)
Hemoglobin: 8.8 g/dL — ABNORMAL LOW (ref 13.0–17.0)
MCH: 28.3 pg (ref 26.0–34.0)
MCHC: 31 g/dL (ref 30.0–36.0)
MCV: 91.3 fL (ref 80.0–100.0)
Platelets: 151 10*3/uL (ref 150–400)
RBC: 3.11 MIL/uL — ABNORMAL LOW (ref 4.22–5.81)
RDW: 15.8 % — ABNORMAL HIGH (ref 11.5–15.5)
WBC: 14.3 10*3/uL — ABNORMAL HIGH (ref 4.0–10.5)
nRBC: 0.3 % — ABNORMAL HIGH (ref 0.0–0.2)

## 2020-04-07 LAB — RENAL FUNCTION PANEL
Albumin: 2 g/dL — ABNORMAL LOW (ref 3.5–5.0)
Albumin: 2.1 g/dL — ABNORMAL LOW (ref 3.5–5.0)
Anion gap: 11 (ref 5–15)
Anion gap: 12 (ref 5–15)
BUN: 34 mg/dL — ABNORMAL HIGH (ref 8–23)
BUN: 32 mg/dL — ABNORMAL HIGH (ref 8–23)
CO2: 24 mmol/L (ref 22–32)
CO2: 24 mmol/L (ref 22–32)
Calcium: 8.1 mg/dL — ABNORMAL LOW (ref 8.9–10.3)
Calcium: 8.5 mg/dL — ABNORMAL LOW (ref 8.9–10.3)
Chloride: 101 mmol/L (ref 98–111)
Chloride: 101 mmol/L (ref 98–111)
Creatinine, Ser: 2.16 mg/dL — ABNORMAL HIGH (ref 0.61–1.24)
Creatinine, Ser: 1.96 mg/dL — ABNORMAL HIGH (ref 0.61–1.24)
GFR calc Af Amer: 35 mL/min — ABNORMAL LOW (ref >60)
GFR calc Af Amer: 39 mL/min — ABNORMAL LOW (ref >60)
GFR calc non Af Amer: 30 mL/min — ABNORMAL LOW (ref >60)
GFR calc non Af Amer: 34 mL/min — ABNORMAL LOW (ref >60)
Glucose, Bld: 131 mg/dL — ABNORMAL HIGH (ref 70–99)
Glucose, Bld: 195 mg/dL — ABNORMAL HIGH (ref 70–99)
Phosphorus: 3.2 mg/dL (ref 2.5–4.6)
Phosphorus: 3.3 mg/dL (ref 2.5–4.6)
Potassium: 4.5 mmol/L (ref 3.5–5.1)
Potassium: 4.6 mmol/L (ref 3.5–5.1)
Sodium: 136 mmol/L (ref 135–145)
Sodium: 137 mmol/L (ref 135–145)

## 2020-04-07 LAB — GLUCOSE, CAPILLARY
Glucose-Capillary: 153 mg/dL — ABNORMAL HIGH (ref 70–99)
Glucose-Capillary: 124 mg/dL — ABNORMAL HIGH (ref 70–99)
Glucose-Capillary: 84 mg/dL (ref 70–99)
Glucose-Capillary: 100 mg/dL — ABNORMAL HIGH (ref 70–99)
Glucose-Capillary: 175 mg/dL — ABNORMAL HIGH (ref 70–99)
Glucose-Capillary: 128 mg/dL — ABNORMAL HIGH (ref 70–99)

## 2020-04-07 LAB — POCT I-STAT 7, (LYTES, BLD GAS, ICA,H+H)
Acid-Base Excess: 2 mmol/L (ref 0.0–2.0)
Bicarbonate: 27.3 mmol/L (ref 20.0–28.0)
Calcium, Ion: 1.18 mmol/L (ref 1.15–1.40)
HCT: 28 % — ABNORMAL LOW (ref 39.0–52.0)
Hemoglobin: 9.5 g/dL — ABNORMAL LOW (ref 13.0–17.0)
O2 Saturation: 95 %
Patient temperature: 96.4
Potassium: 4.3 mmol/L (ref 3.5–5.1)
Sodium: 137 mmol/L (ref 135–145)
TCO2: 29 mmol/L (ref 22–32)
pCO2 arterial: 45.2 mmHg (ref 32.0–48.0)
pH, Arterial: 7.383 (ref 7.350–7.450)
pO2, Arterial: 72 mmHg — ABNORMAL LOW (ref 83.0–108.0)

## 2020-04-07 LAB — PROTIME-INR
INR: 1.8 — ABNORMAL HIGH (ref 0.8–1.2)
Prothrombin Time: 20.7 seconds — ABNORMAL HIGH (ref 11.4–15.2)

## 2020-04-07 LAB — COOXEMETRY PANEL
Carboxyhemoglobin: 1.7 % — ABNORMAL HIGH (ref 0.5–1.5)
Methemoglobin: 1.2 % (ref 0.0–1.5)
O2 Saturation: 73.7 %
Total hemoglobin: 9.4 g/dL — ABNORMAL LOW (ref 12.0–16.0)

## 2020-04-07 LAB — MAGNESIUM: Magnesium: 2.5 mg/dL — ABNORMAL HIGH (ref 1.7–2.4)

## 2020-04-07 LAB — LACTATE DEHYDROGENASE: LDH: 283 U/L — ABNORMAL HIGH (ref 98–192)

## 2020-04-07 MED ORDER — WARFARIN SODIUM 1 MG PO TABS
1.0000 mg | ORAL_TABLET | Freq: Once | ORAL | Status: AC
  Administered 2020-04-07: 16:00:00 1 mg via ORAL
  Filled 2020-04-07: qty 1

## 2020-04-07 MED ORDER — ORAL CARE MOUTH RINSE
15.0000 mL | Freq: Two times a day (BID) | OROMUCOSAL | Status: DC
  Administered 2020-04-07 – 2020-04-12 (×10): 15 mL via OROMUCOSAL

## 2020-04-07 NOTE — Anesthesia Preprocedure Evaluation (Signed)
Anesthesia Evaluation  Patient identified by MRN, date of birth, ID band Patient unresponsive    Reviewed: Allergy & Precautions, Patient's Chart, lab work & pertinent test results, Unable to perform ROS - Chart review onlyPreop documentation limited or incomplete due to emergent nature of procedure.  Airway Mallampati: Intubated       Dental   Pulmonary    breath sounds clear to auscultation       Cardiovascular hypertension, + angina + CAD, + Cardiac Stents and +CHF  + dysrhythmias Atrial Fibrillation + Cardiac Defibrillator  Rhythm:Regular Rate:Normal  LVAD place 4/8   Neuro/Psych PSYCHIATRIC DISORDERS Anxiety Depression Bipolar Disorder    GI/Hepatic Neg liver ROS, GERD  ,  Endo/Other  diabetes  Renal/GU CRFRenal disease     Musculoskeletal  (+) Arthritis ,   Abdominal   Peds  Hematology  (+) Blood dyscrasia, anemia ,   Anesthesia Other Findings   Reproductive/Obstetrics                             Anesthesia Physical Anesthesia Plan  ASA: IV  Anesthesia Plan: General   Post-op Pain Management:    Induction: Intravenous and Inhalational  PONV Risk Score and Plan: 2 and Treatment may vary due to age or medical condition  Airway Management Planned: Oral ETT  Additional Equipment: Arterial line  Intra-op Plan:   Post-operative Plan: Post-operative intubation/ventilation  Informed Consent:     History available from chart only and Only emergency history available  Plan Discussed with: CRNA and Surgeon  Anesthesia Plan Comments:         Anesthesia Quick Evaluation                                  Anesthesia Evaluation  Patient identified by MRN, date of birth, ID band Patient awake    Reviewed: Allergy & Precautions, NPO status , Patient's Chart, lab work & pertinent test results, reviewed documented beta blocker date and time   History of Anesthesia  Complications Negative for: history of anesthetic complications  Airway Mallampati: II  TM Distance: >3 FB Neck ROM: Full    Dental  (+) Edentulous Upper, Missing, Poor Dentition, Loose   Pulmonary shortness of breath,  02/13/2020 SARS coronavirus NEG [place])   Pulmonary exam normal        Cardiovascular hypertension, Pt. on medications and Pt. on home beta blockers + angina + CAD (severe 3v ASCAD.  Initially plan for CABG but given need for re-do sternotomy, relatively poor targets and longstanding low EF suspect VAD may be better option if RV can tolerate and creatinine improvs) and + Cardiac Stents  + dysrhythmias Atrial Fibrillation + Cardiac Defibrillator + Valvular Problems/Murmurs (severe MR, s/p MV repair) MR  Rhythm:Irregular Rate:Normal + Systolic murmurs 01/17/2352 ECHO: Global hypokinesis with akinesis of the inferolateral wall; overall severe LV dysfunction, EF <20%; elevated LV filling pressure; mild LVH; mild LVE;  severe MR; biatrial enlargement; mild RV dysfunction; mild TR with moderate pulmonary hypertension.      Neuro/Psych Anxiety Depression Bipolar Disorder negative neurological ROS     GI/Hepatic Neg liver ROS, GERD  Medicated and Controlled,diarrhea   Endo/Other  diabetes (glu 269), Insulin DependentMorbid obesity  Renal/GU Renal InsufficiencyRenal disease (creat 1.56)     Musculoskeletal   Abdominal   Peds  Hematology eliquis   Anesthesia Other Findings   Reproductive/Obstetrics  Anesthesia Physical Anesthesia Plan  ASA: IV  Anesthesia Plan: MAC   Post-op Pain Management:    Induction:   PONV Risk Score and Plan: 1 and Ondansetron and Treatment may vary due to age or medical condition  Airway Management Planned: Natural Airway and Nasal Cannula  Additional Equipment:   Intra-op Plan:   Post-operative Plan:   Informed Consent: I have reviewed the patients History  and Physical, chart, labs and discussed the procedure including the risks, benefits and alternatives for the proposed anesthesia with the patient or authorized representative who has indicated his/her understanding and acceptance.       Plan Discussed with: CRNA and Surgeon  Anesthesia Plan Comments:        Anesthesia Quick Evaluation

## 2020-04-07 NOTE — Anesthesia Postprocedure Evaluation (Signed)
Anesthesia Post Note  Patient: [name] [name]  Procedure(s) Performed: VIDEO ASSISTED THORACOSCOPY (Left Chest) TRANSESOPHAGEAL ECHOCARDIOGRAM (TEE) (N/A ) Evacuation Hematoma (Left Chest)     Patient location during evaluation: SICU Anesthesia Type: General Level of consciousness: sedated Pain management: pain level controlled Vital Signs Assessment: post-procedure vital signs reviewed and stable Respiratory status: patient remains intubated per anesthesia plan Cardiovascular status: stable Postop Assessment: no apparent nausea or vomiting Anesthetic complications: no    Last Vitals:  Vitals:   04/07/20 1500 04/07/20 1600  BP: (!) 77/65 125/87  Pulse: 91 (!) 106  Resp: 13 13  Temp: (!) 35.7 C   SpO2: 92% 91%    Last Pain:  Vitals:   04/07/20 1600  TempSrc:   PainSc: Asleep                 Shivaun Bilello

## 2020-04-07 NOTE — Progress Notes (Signed)
LVAD Coordinator Rounding Note:  HM III LVAD implanted on 03/31/2020 with MAZE procedure + LAA clipping  by Dr Orvan Seen under destination therapy criteria due to advanced age. Primary Heart Failure Cardiologist is Dr. Haroldine Laws.   Pt lying in bed. Core-track replaced this morning. Pt shakes head yes and no when asked questions. Speech is delayed per beside RN. He is able to move bilateral lower extremities; left leg weaker than right. When asked to lift both arms pt shook his head "no." Stuck out tongue to command.   Pt remains on CVVH; nurse reports keeping even.  Vital signs: Temp:  HR: 93 - afib A line: 106/62 (73) Cuff: 108/86 (93) O2 Sat: 95% on 6L Wt: 253.3>262.3>260.8>259.9>237.8>220>223>222.8>236.9>235>233.5>220.2 lbs   LVAD interrogation reveals:  Speed: 5300 Flow: 4.4 Power: 3.7w  PI: 3.6  Alarms: none  Events: none Hematocrit: 25  Fixed speed: 5300 Low speed limit: 5000  CRRT: keeping even  Drive Line: VAD dressing C/D/I; anchor intact and accurately applied. Existing VAD dressing removed using sterile technique. Drive line exit site cleaned with Chlora prep applicators x 2, allowed to dry, and gauze dressing with silver strip re-applied. Exit site with suture intact, the velour is fully implanted at exit site. Slight redness, no tenderness, drainage, foul odor or rash noted. Drive line anchor intact. Will advance to twice weekly (Monday / Thursday) dressing changes per nurse champion or VAD coordinator.  Next dressing change due 04/10/20.      Labs:  LDH trend: 360>513>482>467>453>454>474>428>415>422>349>305>279>283  INR trend: 1.3>6.2>9>4.4>6.2>2.2>2.4>2.2>2.0>2.2>2.3>3.4>2.4>1.8  CR: 3.57>3.98>3.18>2.44>2.41>2.0>2.1>1.96>3.29>4.3>3.6>2.53>2.25>2.16  Anticoagulation Plan: -INR Goal: 2.0-2.5 -ASA Dose: 81 mg   Respiratory: - extubated 4/9/2 - re-intubated 03/24/2020 s/p left hemothorax with cardiac arrest  Nitric Oxide: off 03/23/20  Blood Products:  Intraop: 6  FFP  DDAVP  420 cell saver  03/30/2020>>4 FFP 03/21/20>> 1 RBC 03/22/20>>1 RBC  Post-op: 04/09/2020>>3 PC's, 2 FFP  Intra-op: 03/17/2020>>2 PCs      2 FFP  Cultures:  -03/25/20- blood cultures>>negative - 04/02/2020-blood cultures>>pending  Device: -St [name] -Therapies: VF 194 - therapy on  Drips: - Milrinone 0.25 mcg/kg/min - Epi 6 mcg/min >> off  - Vaso 0.03 units/min >> off  - Levo 11 mcg/min - Fentanyl 25 mcg/hr >> off  - Amiodarone 30 mg/hr - Tube feed: 50 mL/hr >> off  Adverse Events on VAD: >>03/25/20 CVVH Started >>04/05/2020 left hemothorax; cardiac arrest  Pt Education:  1. Education inappropriate at this time, pt is only able to follow simple commands at this time.  2. No family at bedside.  Plan/Recommendations:  1. Call VAD coordinator for any equipment or drive line issues.  2. Dressing changes Monday / Thursday per nurse champion or VAD coordinator.   Emerson Monte RN [place] Coordinator  Office: [phone]  24/7 Pager: [phone]

## 2020-04-07 NOTE — Progress Notes (Signed)
6 Days Post-Op Procedure(s) (LRB): VIDEO ASSISTED THORACOSCOPY (Left) TRANSESOPHAGEAL ECHOCARDIOGRAM (TEE) (N/A) Evacuation Hematoma (Left) Subjective: No complaints  Objective: Vital signs in last 24 hours: Temp:  [96.5 F (35.8 C)-99.4 F (37.4 C)] 97.9 F (36.6 C) (04/26 0415) Pulse Rate:  [66-123] 88 (04/26 0700) Cardiac Rhythm: Atrial fibrillation (04/25 2000) Resp:  [10-28] 15 (04/26 0700) BP: (80-114)/(50-87) 86/62 (04/26 0700) SpO2:  [90 %-100 %] 96 % (04/26 0700) Arterial Line BP: (82-118)/(54-77) 111/69 (04/26 0700) FiO2 (%):  [40 %-60 %] 50 % (04/26 0344) Weight:  [99.9 kg] 99.9 kg (04/26 0615)  Hemodynamic parameters for last 24 hours: CVP:  [2 mmHg-18 mmHg] 7 mmHg  Intake/Output from previous day: 04/25 0701 - 04/26 0700 In: 2934.3 [I.V.:1934.1; NG/GT:550; IV Piggyback:450.2] Out: 8032 [Stool:50; Chest Tube:50] Intake/Output this shift: No intake/output data recorded.  General appearance: cooperative and no distress Neurologic: intact Heart: irregularly irregular rhythm Lungs: diminished breath sounds bilaterally Abdomen: soft, non-tender; bowel sounds normal; no masses,  no organomegaly Extremities: edema 1+ Wound: c/d/i  Lab Results: Recent Labs    04/06/20 0256 04/06/20 0312 04/06/20 2004 04/07/20 0311  WBC 8.8  --   --  14.3*  HGB 8.4*   < > 9.5* 8.8*  HCT 27.5*   < > 28.0* 28.4*  PLT 131*  --   --  151   < > = values in this interval not displayed.   BMET:  Recent Labs    04/06/20 1556 04/06/20 1556 04/06/20 2004 04/07/20 0311  NA 138   < > 135 136  K 4.6   < > 4.5 4.5  CL 103  --   --  101  CO2 25  --   --  24  GLUCOSE 93  --   --  131*  BUN 31*  --   --  34*  CREATININE 1.86*  --   --  2.16*  CALCIUM 8.4*  --   --  8.1*   < > = values in this interval not displayed.    PT/INR:  Recent Labs    04/07/20 0311  LABPROT 20.7*  INR 1.8*   ABG    Component Value Date/Time   PHART 7.443 04/06/2020 2004   HCO3 25.2 04/06/2020  2004   TCO2 26 04/06/2020 2004   ACIDBASEDEF 2.0 03/24/2020 1405   O2SAT 73.7 04/07/2020 0315   CBG (last 3)  Recent Labs    04/06/20 1920 04/06/20 2334 04/07/20 0316  GLUCAP 114* 153* 124*    Assessment/Plan: S/P Procedure(s) (LRB): VIDEO ASSISTED THORACOSCOPY (Left) TRANSESOPHAGEAL ECHOCARDIOGRAM (TEE) (N/A) Evacuation Hematoma (Left) Mobilize Diuresis remove left chest tube--not draining anything  Aggressive chest physiotherapy; keep NPO Aggressive physical therapy-severe debilitation   LOS: 26 days    Wonda Olds 04/07/2020

## 2020-04-07 NOTE — Progress Notes (Signed)
Nutrition Follow-up  DOCUMENTATION CODES:   Not applicable  INTERVENTION:   Tube Feeding via Cortrak (post-pyloric): Vital 1.5at 50 ml/hr Pro-Stat 30 mL QID Provides 141 g of protein, 2200 kcals and 912 mL of free water Meets 100% estimated calorie and protein needs  Continue B-complex with C  Noted concern for diarrhea on TF; pt with orders for colace, miralax and dulcolax daily. Recommend changing to prn or discontinuing. Consider holding reglan as well   NUTRITION DIAGNOSIS:   Increased nutrient needs related to post-op healing, chronic illness as evidenced by estimated needs.  Being addressed via TF   GOAL:   Patient will meet greater than or equal to 90% of their needs  Progressing  MONITOR:   PO intake, Supplement acceptance, Labs, Weight trends  REASON FOR ASSESSMENT:   Consult LVAD Eval  ASSESSMENT:   69 yo male admitted with acute on chronic CHF, AKI on CKD 3. Initial plan for CABG but with re-do sternotomy, poor targets and longstanding low EF plan for LVAD instead. PMH includes CAD, CKD, DM  3/31 Admit, Cath with severe 3v CAD 4/03 Swan placed  4/06 Teeth extraction 4/08 HeartMate 3 LVAD placed, re-do sternotomy, Intubated 4/09 Extubated 4/13 CRRT initiated 4/14 Cortrak placed, TF started 4/15 Head CT: right [facility] CVA 4/19 CRRT stopped, respiratory arrest requiring intubation 4/20 Hemothorax requiring emergent chest tube placement, VATS, CRRT re-initiated 4/21 Cortrak advanced to proximal duodenum; Concern for posturing, EEG  4/25 Extubated 4/26 Cortrak repositioned  Pt alert, on [place] Pt remains NPO, possible FEES tomorrow. Remains on CRRT.   Cortrak being repositioned today; TF on hold due to concern for Cortrak being displaced, found in patient mouth?  Net negative 16 L per I/O flowsheet. Current wt 99.9 kg. Admit weight 111.1 kg  Liquid stool via rectal tube. C.diff negative. 50 mL over night, 100 mL this far today  Noted pt on miralax,  dulcolax and colace daily. Also noted pt has been on Reglan since 4/20. Given concern for diarrhea, recommend holding bowel regimen for now  Labs: reviewed Meds: B-complex with C, prednisone, ss novolog, novolog q 6 hr   Diet Order:   Diet Order            Diet NPO time specified  Diet effective now              EDUCATION NEEDS:   Education needs have been addressed  Skin:  Skin Assessment: Reviewed RN Assessment  Last BM:  4/26  Height:   Ht Readings from Last 1 Encounters:  03/30/2020 6' (1.829 m)    Weight:   Wt Readings from Last 1 Encounters:  04/07/20 99.9 kg    BMI:  Body mass index is 29.87 kg/m.  Estimated Nutritional Needs:   Kcal:  2100-2400 kcals  Protein:  120-140g  Fluid:  1500-2000 mL    Kerman Passey MS, RDN, LDN, CNSC RD Pager Number and RD On-Call Pager Number Located in [place]

## 2020-04-07 NOTE — Progress Notes (Signed)
Admit: 02/25/2020 LOS: 104  48M dialysis dependent AKI on CRRT, acute CHF exacerbation s/p VAD, status post MV repair, AFib s/p MAZE and LAA clipping 4/8; R MCA CVA 4/15; s/p hemothorax and VATS 4/20  S: Seen and examined on CRRT.  Procedure supervised.  Had 3.2 liters UF over 4/25 with CRRT.  Anuric.  He was reduced from net neg 75 an hour to keep even with CVP low.  He was extubated yesterday and was placed on bipap overnight - transitioned to about 6 liters about an hour ago per nursing.  On levo at 12 and milrinone.  Hasn't been following commands   Review of systems:  Unable to obtain 2/2 AMS/hx sedation   O: 04/25 0701 - 04/26 0700 In: 2855.5 [I.V.:1855.3; NG/GT:550; IV Piggyback:450.2] Out: 3370 [Stool:50; Chest Tube:50]  Filed Weights   04/03/20 0500 04/04/20 0300 04/05/20 0500  Weight: 106.7 kg 105.9 kg 104.5 kg   Physical exam:  General adult male in bed critically ill  HEENT normocephalic atraumatic  Lungs coarse breath sounds  Heart S1S2 on levo and milrinone Abdomen soft nontender nondistended Extremities no pitting edema  Neuro - does not follow commands or answer questions for me  Access: right [place] nontunneled catheter     Recent Labs  Lab 04/06/20 0256 04/06/20 0312 04/06/20 1556 04/06/20 2004 04/07/20 0311  NA 137   < > 138 135 136  K 4.7   < > 4.6 4.5 4.5  CL 104  --  103  --  101  CO2 25  --  25  --  24  GLUCOSE 200*  --  93  --  131*  BUN 32*  --  31*  --  34*  CREATININE 1.80*  --  1.86*  --  2.16*  CALCIUM 8.1*  --  8.4*  --  8.1*  PHOS 2.2*  --  3.6  --  3.2   < > = values in this interval not displayed.   Recent Labs  Lab 04/06/2020 0410 03/30/2020 0512 04/02/20 0429 04/03/20 0318 04/05/20 1804 04/05/20 1804 04/06/20 0256 04/06/20 0256 04/06/20 0312 04/06/20 2004 04/07/20 0311  WBC 19.5*   < > 26.9*   < > 7.8  --  8.8  --   --   --  14.3*  NEUTROABS 16.9*  --  24.0*  --   --   --   --   --   --   --   --   HGB 8.5*   < > 8.8*   < >  8.2*   < > 8.4*   < > 8.5* 9.5* 8.8*  HCT 27.0*   < > 27.1*   < > 27.0*   < > 27.5*   < > 25.0* 28.0* 28.4*  MCV 92.5   < > 87.7   < > 94.1  --  92.3  --   --   --  91.3  PLT 153   < > 129*   < > 120*  --  131*  --   --   --  151   < > = values in this interval not displayed.    Scheduled Meds: . sodium chloride   Intravenous Once  . aspirin  81 mg Per Tube Daily  . atorvastatin  80 mg Per Tube q1800  . B-complex with vitamin C  1 tablet Per Tube Daily  . bisacodyl  10 mg Oral Daily   Or  . bisacodyl  10 mg Rectal  Daily  . chlorhexidine gluconate (MEDLINE KIT)  15 mL Mouth Rinse BID  . Chlorhexidine Gluconate Cloth  6 each Topical Daily  . docusate  200 mg Per Tube Daily  . feeding supplement (PRO-STAT SUGAR FREE 64)  30 mL Per Tube QID  . influenza vaccine adjuvanted  0.5 mL Intramuscular Tomorrow-1000  . insulin aspart  0-24 Units Subcutaneous Q4H  . insulin aspart  6 Units Subcutaneous Q4H  . insulin glargine  24 Units Subcutaneous BID  . linezolid  600 mg Per Tube Q12H  . mouth rinse  15 mL Mouth Rinse Q2H  . melatonin  3 mg Per Tube QHS  . metoCLOPramide (REGLAN) injection  5 mg Intravenous Q8H  . pantoprazole (PROTONIX) IV  40 mg Intravenous Daily  . polyethylene glycol  17 g Per Tube Daily  . QUEtiapine  25 mg Per Tube QHS  . sildenafil  20 mg Per Tube TID  . sodium chloride flush  10-40 mL Intracatheter Q12H  . Warfarin - Pharmacist Dosing Inpatient   Does not apply q1600   Continuous Infusions: .  prismasol BGK 4/2.5 500 mL/hr at 04/06/20 2341  . sodium chloride Stopped (03/22/20 2011)  . sodium chloride Stopped (03/27/20 1523)  . sodium chloride    . sodium chloride 10 mL/hr at 04/05/20 1815  . sodium chloride    . amiodarone 30 mg/hr (04/07/20 0600)  . ceFEPime (MAXIPIME) IV Stopped (04/06/20 2152)  . dexmedetomidine (PRECEDEX) IV infusion 0.1 mcg/kg/hr (04/07/20 0600)  . epinephrine Stopped (04/05/20 0854)  . feeding supplement (VITAL 1.5 CAL) 50 mL/hr at  04/06/20 0700  . lactated ringers Stopped (03/29/20 0748)  . milrinone 0.25 mcg/kg/min (04/07/20 0600)  . norepinephrine (LEVOPHED) Adult infusion 13.013 mcg/min (04/07/20 0600)  . prismasol BGK 0/2.5 300 mL/hr at 04/06/20 0612  . prismasol BGK 4/2.5 1,500 mL/hr at 04/07/20 0306  . vasopressin (PITRESSIN) infusion - *FOR SHOCK* Stopped (04/07/20 0515)   PRN Meds:.sodium chloride, acetaminophen, dextrose, fentaNYL (SUBLIMAZE) injection, heparin, heparin, hydrALAZINE, HYDROcodone-acetaminophen, levalbuterol, ondansetron (ZOFRAN) IV, ondansetron (ZOFRAN) IV, oxyCODONE, sodium chloride, sodium chloride flush, sorbitol, traMADol  ABG    Component Value Date/Time   PHART 7.443 04/06/2020 2004   PCO2ART 36.9 04/06/2020 2004   PO2ART 60 (L) 04/06/2020 2004   HCO3 25.2 04/06/2020 2004   TCO2 26 04/06/2020 2004   ACIDBASEDEF 2.0 04/11/2020 1405   O2SAT 73.7 04/07/2020 0315    A/P  1. Anuric Dialysis dependent AKI.  CRRT started 4/13.  on CRRT.  UF is currently at keep even with CVP low at 3 on exam with nursing.  Keep same for now and please contact nephrology for any UF goal changes. 4K pre/D and 0K post.  keep post bath 0K unless K <4.5.  no circuit anticoagluation.  systemic anticoagulation on hold after hemothorax 2. Hypophosphatemia, replete prn for P < 2.5 3. sCHF s/p LVAD 4/8 4. Hemothorax s/p VATS 04/05/2020.  5. AFib s/p MAZE and LAA clipping 4/8 6. DM2 - per primary team  7. R MCA ischemic CVA 8. Shock on norepi and milrinone 9. Anemia - normocytic - no acute indication for PRBC's   Claudia Desanctis, MD 04/07/2020 6:28 AM

## 2020-04-07 NOTE — Progress Notes (Signed)
PULMONARY / CRITICAL CARE MEDICINE   NAME:  [name] [name], MRN:  [id number], DOB:  March 25, 1951, LOS: 61 ADMISSION DATE:  03/11/2020, CONSULTATION DATE:  03/27/20 REFERRING MD:  Aundra Dubin, CHIEF COMPLAINT:  Respiratory failure  BRIEF HISTORY:    69 year old man with advanced ischemic cardiomyopathy, prior mitral valve repair, afib presented for redo sternotomy, LVAD implantation, left atrial appendage clipping, and sternal reconstruction on 04/07/2020.  Hospitalization complicated by renal failure, RV dysfunction, persistent delirium, and R MCA stroke.  Respiratory status has been tenuous.  On the morning of 4/20, patient became agonal with low flow on LVAD followed by intubation and two rounds of epi.  PCCM re-consulted.  HISTORY OF PRESENT ILLNESS   69 year old man with advanced ischemic cardiomyopathy, prior mitral valve repair, afib presented for redo sternotomy, LVAD implantation, left atrial appendage clipping, and sternal reconstruction on 03/22/2020.    Postoperative course complicated by persistent delirium, RV dysfunction, renal failure, abnormal INR of unclear etiology.  Started on CRRT 4/13.  Patient less responsive.  Found on CT head 4/15 with acute posterior R MCA stroke.  Neurology following.  Respiratory status has been tenuous post op as well in which PCCM initially consulted for.  Has remained mostly on BIPAP.  On 4/16, on 5 L [place] and saturating well. He is alert and oriented to self today (4/16).  PCCM available on as needed basis.  On the morning of 4/20, patient became agonal with low flow on LVAD followed by intubation and two rounds of epi.  PCCM re-consulted.  SIGNIFICANT PAST MEDICAL HISTORY   Ischemic cardiomyopathy MV prolapse s/p repair in 2004 HTN HLD CKD  SIGNIFICANT EVENTS:  4/8: redo sternotomy, LVAD implantation, left atrial appendage clipping, and sternal reconstruction  4/9: extubated 4/13 CRRT started  4/15 R MCA CVA on [facility] 4/16 off Bipap, on 5L [place] 4/19 CRRT  stopped, respiratory arrest, intubated, chest tube placed for L hemothorax  4/20 L VATS with 2 pleural drains placed  4/25 Extubated   STUDIES:   CT Head 03/27/20 IMPRESSION: Large posterior right MCA infarct, new since prior study.  CT a/p 03/25/2020 IMPRESSION: 1. No acute intra-abdominal or pelvic pathology. No bowel obstruction. Normal appendix. 2. Cirrhosis with small perihepatic ascites. 3. Cholelithiasis. No evidence of acute cholecystitis by CT. 4. Aortic Atherosclerosis (ICD10-I70.0).  4/15 CXR volume overload  Echo 4/10 1. Images are poor quality and cannot comment on global LV function. .  Left ventricular ejection fraction, by estimation, is 20 to 25%. The left  ventricle has severely decreased function. The left ventricle demonstrates  global hypokinesis. There is  moderate concentric left ventricular hypertrophy.  2. Aortic dilatation noted. There is mild dilatation of the ascending  aorta and of the aortic root measuring 41 mm and 41mm respectively.  3. Right ventricular systolic function is severely reduced. The right  ventricular size is moderately enlarged.  4. The aortic valve is tricuspid. Mild aortic valve sclerosis is present,  with no evidence of aortic valve stenosis.   CULTURES:  COVID swab neg on admission MRSA PCR neg 4/7 Blood cultures x 2 4/13 neg Urine culture 4/12 neg  Bcx 4/20>  Respiratory cx 4/20>   ANTIBIOTICS:  Vanc 4/12 - 4/17 Cefepime 4/11 - 4/17 Linezolid 4/20 >  Cefepime 4/20>   LINES/TUBES:  4/8: redo sternotomy, LVAD implantation, left atrial appendage clipping, and sternal reconstruction   RIJ introducer >> R [place] HD catheter >> R radial A line >> out R/L pleural drains  >>  out  4/20 ETT >> 4/20 R radial aline >> 4/20 left chest tube, 2 pleural drains >>  CONSULTANTS:  CHF, TCTS, PCCM, Nephro, Neuro, Hematology  SUBJECTIVE:  BiPAP overnight due to increased work of breathing. Required low dose Precedex gtt  during this time. Back on [place] this AM and off Precedex.   CONSTITUTIONAL: BP 104/86   Pulse 85   Temp 97.9 F (36.6 C) (Axillary)   Resp 12   Ht 6' (1.829 m)   Wt 99.9 kg   SpO2 96%   BMI 29.87 kg/m   I/O last 3 completed shifts: In: 5699.7 [I.V.:3043.1; Blood:630; NG/GT:1430; IV Piggyback:596.6] Out: 8936 [Emesis/NG output:50; JHERD:4081; Stool:100; Chest Tube:20]  CVP:  [2 mmHg-18 mmHg] 5 mmHg  Vent Mode: BIPAP;PCV FiO2 (%):  [40 %-60 %] 50 % Set Rate:  [10 bmp-12 bmp] 12 bmp PEEP:  [5 cmH20-6 cmH20] 5 cmH20 Pressure Support:  [5 cmH20] 5 cmH20  PHYSICAL EXAM: General: chronically ill appearing male, somnolent, in no acute distress  HEENT: NCAT, ETT in place, CorTrack in place, RIJ CVC, [facility] HD cath Neuro: awakens easily to verbal stimulus, answers questions appropriately, A&Ox3, follows commands, moving all 4 extremities  PULM: coarse breath sounds and ronchi bilaterally, no increased WOB on 4L [place]  CV:  LVAD hum  GI: soft, NTND  Extremities: 1+ pitting edema on all 4 extremities   Skin: warm   RESOLVED PROBLEM LIST    ASSESSMENT AND PLAN    # Acute hypoxic respiratory failure secondary to L hemothorax s/p L CT placement and VATS 4/20 L chest tube without leak >>  minimal drainage -- Extubated 4/25 AM. Required BiPAP overnight due to increased WOB, but back on [place] and appears comfortable  -- Chest PT and NT suctioning for secretions  -- Remains edematous on exam --> Cont' CVHD for fluid removal  -- L Chest tube and 2 pleural drain in place with minimal output  -- Bcx, collected 4/20, negative and trach aspirate with rare gram variable rods  -- Palliative care involved and updating family  # Cardiogenic shock   # Suspected PEA arrest  # ICM s/p LVAD placement 4/8 for destination therapy  - LVAD settings are stable -- No obvious tamponade physiology on bedside TTE by Dr. Haroldine Laws  -- On norepi, and vaso gtt. Epi off, 4/24 -- Trend H/H -- LVAD Management per  TCT/CHF team -- On ASA and Coumadin, INR goal 2-3    # Right MCA cardioembolic stroke, suspected secondary to Afib  # Seizure like-activity  -- Neuro signed off 4/17. Recommended continuing AC with ASA and warfarin  -- Patient noted to have rhythmic movements of bilateral UE 4/21. EEG showed moderate to severe diffuse encephalopathy, but no seizures or epileptiform discharges. -- Continued Neuro exams per unit protocol   # Chronic afib -- S/p maze and LAA clipping 4/8 -- On Coumadin per pharmacy  -- On amiodarone drip    # Acute renal failure on CKD  -- Nephrology following  -- On CRRT 4/19 -- Remains anuric -- Trend renal panel/ mag daily   # DM type II uncontrolled  -- SSI and Lantus  # Large Volume Diarrhea likely side-effect of antibiotic therapy  -- No fever>> WBC is down trending>> low suspicion C diff -- Hold off laxatives  -- Immodium PRN  -- Dietitian to eval for change in TF  # GOC:  -- Palliative care following   -- Daughter brought HCPOA papers 4/24   Best Practice /  Goals of Care / Disposition.   Diet: TF  Pain/Anxiety/Delirium protocol (if indicated): precedex gtt VAP protocol (if indicated): yes DVT prophylaxis: warfarin, SCD  GI prophylaxis: PPI Glucose control: SSI Mobility: BR Code Status: partial- no compression given LVAD Family Communication: per primary Disposition: ICU  LABS  Glucose Recent Labs  Lab 04/06/20 0758 04/06/20 1132 04/06/20 1559 04/06/20 1920 04/06/20 2334 04/07/20 0316  GLUCAP 125* 112* 85 114* 153* 124*    BMET Recent Labs  Lab 04/06/20 0256 04/06/20 0312 04/06/20 1556 04/06/20 2004 04/07/20 0311  NA 137   < > 138 135 136  K 4.7   < > 4.6 4.5 4.5  CL 104  --  103  --  101  CO2 25  --  25  --  24  BUN 32*  --  31*  --  34*  CREATININE 1.80*  --  1.86*  --  2.16*  GLUCOSE 200*  --  93  --  131*   < > = values in this interval not displayed.    Liver Enzymes Recent Labs  Lab 04/04/20 0332 04/04/20 1610  04/05/20 0323 04/05/20 1548 04/06/20 0256 04/06/20 1556 04/07/20 0311  AST 35  --  33  --  34  --   --   ALT 22  --  23  --  21  --   --   ALKPHOS 112  --  142*  --  129*  --   --   BILITOT 0.9  --  0.6  --  0.8  --   --   ALBUMIN 2.2*  2.1*   < > 2.1*  2.2*   < > 2.1*  2.2* 2.2* 2.0*   < > = values in this interval not displayed.    Electrolytes Recent Labs  Lab 04/05/20 0323 04/05/20 1548 04/06/20 0256 04/06/20 1556 04/07/20 0311  CALCIUM 8.4*   < > 8.1* 8.4* 8.1*  MG 2.6*  --  2.6*  --  2.5*  PHOS 2.7   < > 2.2* 3.6 3.2   < > = values in this interval not displayed.    CBC Recent Labs  Lab 04/05/20 1804 04/05/20 1804 04/06/20 0256 04/06/20 0256 04/06/20 0312 04/06/20 2004 04/07/20 0311  WBC 7.8  --  8.8  --   --   --  14.3*  HGB 8.2*   < > 8.4*   < > 8.5* 9.5* 8.8*  HCT 27.0*   < > 27.5*   < > 25.0* 28.0* 28.4*  PLT 120*  --  131*  --   --   --  151   < > = values in this interval not displayed.    ABG Recent Labs  Lab 04/04/20 0337 04/06/20 0312 04/06/20 2004  PHART 7.385 7.426 7.443  PCO2ART 47.2 41.6 36.9  PO2ART 149* 155* 60*    Coag's Recent Labs  Lab 04/05/20 0323 04/06/20 0256 04/07/20 0311  INR 1.9* 1.6* 1.8*    Sepsis Markers No results for input(s): LATICACIDVEN, PROCALCITON, O2SATVEN in the last 168 hours.  Cardiac Enzymes No results for input(s): TROPONINI, PROBNP in the last 168 hours.

## 2020-04-07 NOTE — Procedures (Addendum)
Cortrak  Person Inserting Tube:  Kalecia Hartney, Creola Corn, RD Tube Type:  Cortrak - 43 inches Tube Location:  Left nare Initial Placement:  Postpyloric Secured by: Bridle Technique Used to Measure Tube Placement:  Documented cm marking at nare/ corner of mouth Cortrak Secured At:  95 cm    Cortrak Tube Team Note:  Consult received to place a Cortrak feeding tube.    X-ray is required, abdominal x-ray has been ordered by the Cortrak team. Please confirm tube placement before using the Cortrak tube.   X-ray obtained and the feeding tube tip is in the distal descending duodenum.  If the tube becomes dislodged please keep the tube and contact the Cortrak team at [email or web address] (password [email or web address]) for replacement.  If after hours and replacement cannot be delayed, place a NG tube and confirm placement with an abdominal x-ray.    Larkin Ina, MS, RD, LDN RD pager number and weekend/on-call pager number located in [place].

## 2020-04-07 NOTE — Progress Notes (Signed)
CSW visited bedside and patient resting comfortably. NO family at bedside at the time of visit. CSW continues to follow. [name], [place], [place]

## 2020-04-07 NOTE — Evaluation (Signed)
Clinical/Bedside Swallow Evaluation Patient Details  Name: VYOM BRASS MRN: [id number] Date of Birth: 1951-02-23  Today's Date: 04/07/2020 Time: SLP Start Time (ACUTE ONLY): 24 SLP Stop Time (ACUTE ONLY): 0930 SLP Time Calculation (min) (ACUTE ONLY): 15 min  Past Medical History:  Past Medical History:  Diagnosis Date  . Anxiety   . Arthritis   . CAD (coronary artery disease)    a.  1993 s/p MI - [employer];  b. s/p BMS to LAD '00;  c. PTCA 2nd diagonal 2010;  d. 02/18/12 Cath: moderate nonobs dzs - med rx;  e.  01/2015 Cath: LM nl, LAD 40-18m ISR, 70-63m/d, d1 90p (3.0x16 Synergy DES), D2 50-60, LCX nl, OM1 50p, 33m (2.5x12 Synergy DES), RCA nl, EF 30-35%.  . Chronic combined systolic and diastolic CHF (congestive heart failure) ([place])    a. 12/2014 Echo: EF 30-35%, Gr2 DD, mod MR, sev dil LA.  . CKD (chronic kidney disease), stage III   . Depression   . ED (erectile dysfunction)   . GERD (gastroesophageal reflux disease)   . Hyperlipidemia   . Hypertension   . Ischemic cardiomyopathy    a. s/p St. [name] (Atlas) ICD implanted in [place] 2007;  b. 12/2014 Echo: Ef 30-35%.  . MVP (mitral valve prolapse)    a. s/p MV annuloplasty at [facility] 2004.  Marland Kitchen Persistent atrial fibrillation ([place])    a. noted on ICD interrogation '10 - not previously on [place] - CHA2DS2VASc = 5.  . Type II diabetes mellitus ([place])    uncontrolled   Past Surgical History:  Past Surgical History:  Procedure Laterality Date  . CARDIAC DEFIBRILLATOR PLACEMENT  2007   implanted in [place], has a 7001 RV lead and a SJM Atlas ICD followed by Dr Caryl Comes  . CARDIOVERSION N/A 09/29/2016   Procedure: CARDIOVERSION;  Surgeon: Lelon Perla, MD;  Location: [facility] ENDOSCOPY;  Service: Cardiovascular;  Laterality: N/A;  . CARDIOVERSION N/A 12/03/2016   Procedure: CARDIOVERSION;  Surgeon: Dorothy Spark, MD;  Location: [place];  Service: Cardiovascular;  Laterality: N/A;  . CLIPPING OF ATRIAL APPENDAGE  Left 04/07/2020   Procedure: Clipping Of Atrial Appendage;  Surgeon: Wonda Olds, MD;  Location: [place];  Service: Open Heart Surgery;  Laterality: Left;  . HEMATOMA EVACUATION Left 03/15/2020   Procedure: Evacuation Hematoma;  Surgeon: Prescott Gum, Collier Salina, MD;  Location: [place];  Service: Thoracic;  Laterality: Left;  . IMPLANTABLE CARDIOVERTER DEFIBRILLATOR (ICD) GENERATOR CHANGE N/A 03/19/2013   SJM Fortify ST DR generator placed by Dr Lovena Le, part of Analyze ST study  . INSERTION OF IMPLANTABLE LEFT VENTRICULAR ASSIST DEVICE N/A 03/19/2020   Procedure: INSERTION OF IMPLANTABLE LEFT VENTRICULAR ASSIST DEVICE - HM3;  Surgeon: Wonda Olds, MD;  Location: [place];  Service: Open Heart Surgery;  Laterality: N/A;  . LEFT AND RIGHT HEART CATHETERIZATION WITH CORONARY ANGIOGRAM N/A 01/14/2015   Procedure: LEFT AND RIGHT HEART CATHETERIZATION WITH CORONARY ANGIOGRAM;  Surgeon: Peter M Martinique, MD;  Location: [facility] CATH LAB;  Service: Cardiovascular;  Laterality: N/A;  . LEFT HEART CATHETERIZATION WITH CORONARY ANGIOGRAM N/A 02/17/2012   Procedure: LEFT HEART CATHETERIZATION WITH CORONARY ANGIOGRAM;  Surgeon: Jolaine Artist, MD;  Location: [facility] CATH LAB;  Service: Cardiovascular;  Laterality: N/A;  . MAZE N/A 03/22/2020   Procedure: MAZE;  Surgeon: Wonda Olds, MD;  Location: [place];  Service: Open Heart Surgery;  Laterality: N/A;  . MITRAL VALVE ANNULOPLASTY  2004   Archie Endo 02/17/2012  . MULTIPLE  EXTRACTIONS WITH ALVEOLOPLASTY N/A 04/09/2020   Procedure: Extraction of tooth #'s 20,21,22 and 23 with alveoloplasaty;  Surgeon: Lenn Cal, DDS;  Location: [place];  Service: Oral Surgery;  Laterality: N/A;  . RIGHT/LEFT HEART CATH AND CORONARY ANGIOGRAPHY N/A 03/11/2020   Procedure: RIGHT/LEFT HEART CATH AND CORONARY ANGIOGRAPHY;  Surgeon: Burnell Blanks, MD;  Location: [place] CV LAB;  Service: Cardiovascular;  Laterality: N/A;  . TEE WITHOUT CARDIOVERSION N/A 09/29/2016   Procedure:  TRANSESOPHAGEAL ECHOCARDIOGRAM (TEE);  Surgeon: Lelon Perla, MD;  Location: [facility] ENDOSCOPY;  Service: Cardiovascular;  Laterality: N/A;  . TEE WITHOUT CARDIOVERSION N/A 12/03/2016   Procedure: TRANSESOPHAGEAL ECHOCARDIOGRAM (TEE);  Surgeon: Dorothy Spark, MD;  Location: [place];  Service: Cardiovascular;  Laterality: N/A;  . TEE WITHOUT CARDIOVERSION N/A 04/06/2020   Procedure: TRANSESOPHAGEAL ECHOCARDIOGRAM (TEE);  Surgeon: Wonda Olds, MD;  Location: [place];  Service: Open Heart Surgery;  Laterality: N/A;  . TEE WITHOUT CARDIOVERSION N/A 04/08/2020   Procedure: TRANSESOPHAGEAL ECHOCARDIOGRAM (TEE);  Surgeon: Prescott Gum, Collier Salina, MD;  Location: [place];  Service: Thoracic;  Laterality: N/A;  . VIDEO ASSISTED THORACOSCOPY Left 03/28/2020   Procedure: VIDEO ASSISTED THORACOSCOPY;  Surgeon: Prescott Gum, Collier Salina, MD;  Location: [place];  Service: Thoracic;  Laterality: Left;   HPI:  Mr. Eicher is a 69 year old gentleman with a history of ischemic cardiomyopathy who presented for an LVAD implantation and left atrial appendage clipping with sternal reconstruction on 4/8, extubated 4/9. Started CRRT on 3/71  He had a complicated postop course including a right MCA stroke on 4/15. Evalauted by SLP, started on a diet.  He had a cardiac arrest on 4/20 requiring reintubation until 4/25.  His mental status has been improving slowly for several days.     Assessment / Plan / Recommendation Clinical Impression  Pt demonstrates congested lower airway sounds at baseline with poor drive to cough or clear. He is able to follow all commands with extra time and vocal quality is clear. Initial small bites of ice and sips of water are tolerated with dry vocal quality and no immediate coughing. However, when given consectuive sips with larger volume pt did have a cough impulse without effective expectoration. Given signs of dysphagia and possible decreased sensation of aspiration recommend ongoing NPO status with  supplemental nutrition (high caloric needs). SLP will f/u with FEES instrumental testing at bedside tenatively planned for tomorrow to determine ability to participate in increased therapeutic trials of PO. For now pt may have a few ice chips with RN.  SLP Visit Diagnosis: Dysphagia, oropharyngeal phase (R13.12)    Aspiration Risk  Severe aspiration risk;Risk for inadequate nutrition/hydration    Diet Recommendation Alternative means - temporary;Ice chips PRN after oral care;NPO   Medication Administration: Via alternative means Supervision: Full supervision/cueing for compensatory strategies    Other  Recommendations     Follow up Recommendations Skilled Nursing facility      Frequency and Duration min 2x/week  2 weeks       Prognosis Prognosis for Safe Diet Advancement: Good      Swallow Study   General HPI: Mr. Fritze is a 69 year old gentleman with a history of ischemic cardiomyopathy who presented for an LVAD implantation and left atrial appendage clipping with sternal reconstruction on 4/8, extubated 4/9. Started CRRT on 0/62  He had a complicated postop course including a right MCA stroke on 4/15. Evalauted by SLP, started on a diet.  He had a cardiac arrest on 4/20 requiring reintubation  until 4/25.  His mental status has been improving slowly for several days.   Type of Study: Bedside Swallow Evaluation Previous Swallow Assessment: see HPI Diet Prior to this Study: NPO;NG Tube Temperature Spikes Noted: No Respiratory Status: Nasal cannula History of Recent Intubation: Yes Length of Intubations (days): 6 days(prior intubation 1/2 days) Date extubated: 04/06/20 Behavior/Cognition: Alert;Cooperative Oral Cavity Assessment: Within Functional Limits Oral Care Completed by SLP: No Oral Cavity - Dentition: Edentulous Self-Feeding Abilities: Total assist Patient Positioning: Upright in bed Baseline Vocal Quality: Normal Volitional Cough: Congested;Weak Volitional  Swallow: Unable to elicit    Oral/Motor/Sensory Function Overall Oral Motor/Sensory Function: Generalized oral weakness   Ice Chips Ice chips: Within functional limits   Thin Liquid Thin Liquid: Impaired Presentation: Straw Pharyngeal  Phase Impairments: Cough - Immediate    Nectar Thick Nectar Thick Liquid: Not tested   Honey Thick Honey Thick Liquid: Not tested   Puree Puree: Within functional limits Presentation: Spoon   Solid     Solid: Not tested     Herbie Baltimore, MA CCC-SLP  Acute Rehabilitation Services Pager [phone] Office [phone]  Lynann Beaver 04/07/2020,9:36 AM

## 2020-04-07 NOTE — Progress Notes (Signed)
Physical Therapy Treatment Patient Details Name: [name] [name] MRN: [id number] DOB: June 08, 1951 Today's Date: 04/07/2020    History of Present Illness 69 yo male with history of CAD with prior stenting of the LAD, Diagonal and OM in an outside hospital, ischemic cardiomyopathy with ICD in place, chronic systolic CHF, MVP s/p mitral valve repair, permanent atrial fib on chronic anticoagulation, HTN, HLD, DM, depression admitted to  [facility] with progressive weakness, fatigue, dyspnea and chest pressure. Troponin elevated at 0.06. He was transferred to [facility] for cardiac cath, showing severe 3 vessel disease. Plan for CABG but pt with AKI. Pt underwent multiple tooth extraction on 4/6 and LVAD placement on 4/8. Postop day 7 patient was noted to be agitated the CT head was obtained showing a large posterior right MCA infarct. Pt with arrest on 4/20 and reintubated, extubated 4/25. Pt on CRRT.    PT Comments    Patient progressing slowly, able to tolerate about 18 minutes sitting upright with bed in egress position.  Seems somewhat apraxic on R and with some L inattention.  He moves all extremities and could today after trials coule help reposition to middle of the bed with shoulders.  Feel he will continue to progressing with skilled PT in the acute setting and likely will be able to tolerate CIR level rehab when he stabilizes.  PT to follow.  Follow Up Recommendations  CIR;Supervision/Assistance - 24 hour     Equipment Recommendations  Other (comment)(TBA)    Recommendations for Other Services       Precautions / Restrictions Precautions Precautions: Fall;Sternal Precaution Comments: LVAD, CRRT, cortrak, bil mittens Restrictions Weight Bearing Restrictions: Yes Other Position/Activity Restrictions: sternal precautions    Mobility  Bed Mobility Overal bed mobility: Needs Assistance             General bed mobility comments: supine<>sit utilizing bed egress function of  bed. total +2 assist to slide to [place] transfer comment: did not attempt   Ambulation/Gait                 Stairs             Wheelchair Mobility    Modified Rankin (Stroke Patients Only) Modified Rankin (Stroke Patients Only) Pre-Morbid Rankin Score: No symptoms Modified Rankin: Severe disability     Balance Overall balance assessment: Needs assistance Sitting-balance support: Feet supported Sitting balance-Leahy Scale: Poor Sitting balance - Comments: In chair position in the bed, pt drifts frequently to the Lt requiring mod A initially to correct.  At end of session, he was able to make adjustments to shift to Rt  x 2  Postural control: Left lateral lean Standing balance support: Bilateral upper extremity supported                                Cognition Arousal/Alertness: Awake/alert Behavior During Therapy: Flat affect Overall Cognitive Status: Impaired/Different from baseline Area of Impairment: Orientation;Attention;Following commands;Safety/judgement;Awareness;Problem solving                 Orientation Level: Disoriented to;Time Current Attention Level: Sustained Memory: Decreased recall of precautions;Decreased short-term memory Following Commands: Follows one step commands inconsistently;Follows one step commands with increased time Safety/Judgement: Decreased awareness of safety;Decreased awareness of deficits   Problem Solving: Slow processing;Decreased initiation;Difficulty sequencing General Comments: Pt requires increased time to  initiate activity.  He appears to have ideomotor apraxia vs. motor impersistence.  He says he lives in [place], [place]      Exercises General Exercises - Upper Extremity Shoulder Flexion: AAROM;10 reps;Seated Shoulder ABduction: AAROM;5 reps;Seated General Exercises - Lower Extremity Long Arc Quad: AROM;Seated;10 reps Heel Slides: AAROM;10  reps;Both;Seated Other Exercises Other Exercises: Pt moved into chair position, he was able to tolerate upright position x ~91mins while participating in UE exercise, LE exercise and grooming activities.  Pt demonstrated difficulty maintaining arousal at the end of the session.  Pt     General Comments General comments (skin integrity, edema, etc.): VSS on 6L O2      Pertinent Vitals/Pain Pain Assessment: Faces Faces Pain Scale: Hurts little more Pain Location: general discomfort Pain Descriptors / Indicators: Discomfort Pain Intervention(s): Monitored during session;Repositioned;Patient requesting pain meds-RN notified;Limited activity within patient's tolerance    Home Living Family/patient expects to be discharged to:: Private residence Living Arrangements: Children Available Help at Discharge: Family;Available PRN/intermittently Type of Home: House Home Access: Stairs to enter Entrance Stairs-Rails: Can reach both Home Layout: One level Home Equipment: Cane - single point;Grab bars - tub/shower Additional Comments: He reports he holds onto shower head for balance.    Prior Function Level of Independence: Needs assistance  Gait / Transfers Assistance Needed: pt ambulates with use of cane for household and limited community distances ADL's / Homemaking Assistance Needed: pt reports sponge bathing due to falls risk in tub Comments: amb with cane   PT Goals (current goals can now be found in the care plan section) Acute Rehab PT Goals Patient Stated Goal: Pt did not state  Progress towards PT goals: Progressing toward goals    Frequency    Min 3X/week      PT Plan Current plan remains appropriate    Co-evaluation PT/OT/SLP Co-Evaluation/Treatment: Yes Reason for Co-Treatment: Complexity of the patient's impairments (multi-system involvement);Necessary to address cognition/behavior during functional activity;For patient/therapist safety;To address functional/ADL  transfers PT goals addressed during session: Mobility/safety with mobility;Strengthening/ROM OT goals addressed during session: ADL's and self-care;Strengthening/ROM      AM-PAC PT "6 Clicks" Mobility   Outcome Measure  Help needed turning from your back to your side while in a flat bed without using bedrails?: Total Help needed moving from lying on your back to sitting on the side of a flat bed without using bedrails?: Total Help needed moving to and from a bed to a chair (including a wheelchair)?: Total Help needed standing up from a chair using your arms (e.g., wheelchair or bedside chair)?: Total Help needed to walk in hospital room?: Total Help needed climbing 3-5 steps with a railing? : Total 6 Click Score: 6    End of Session Equipment Utilized During Treatment: Oxygen Activity Tolerance: Patient limited by fatigue Patient left: in bed;with call bell/phone within reach Nurse Communication: Mobility status PT Visit Diagnosis: Muscle weakness (generalized) (M62.81);Other abnormalities of gait and mobility (R26.89);Other symptoms and signs involving the nervous system (R29.898)     Time: 4098-1191 PT Time Calculation (min) (ACUTE ONLY): 32 min  Charges:  $Therapeutic Activity: 8-22 mins                     Magda Kiel, PT Acute Rehabilitation Services [phone] 04/07/2020    Reginia Naas 04/07/2020, 1:17 PM
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
## 2020-04-07 NOTE — Progress Notes (Signed)
Patient ID: [name] [name], male   DOB: [birth year], 69 y.o.   MRN: [id number]    Advanced Heart Failure Rounding Note   Subjective:    - S/p HM3 VAD 4/8 w MAZE procedure + LAA clipping. - Extubated 4/9 - 4/12 LVAD speed increased to 5400 --> echo moderate-severe RV dysfunction, septum mildly left shifted, trivial pericardial effusion.  LVAD speed cut back to 5300.  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  - 4/20 developed hemothorax requiring emergent CT placement and eventual VATS - 4/21 EEG done given concerns for posturing and c/w moderate to severe diffuse encephalopathy. No seizures or epileptiform discharges were seen  - 04/03/20 Pump Stop --> controller change out. CT of head unchanged R MCA CVA - 04/06/20 Extubated  Extubated. Awake. Following commands. Denies pain or SOB   On NE 11 milrinone 0.25   Remains anuric. On CVVHD keeping even. CVP 5-6   Left chest tube pulled this am   VAD Interrogation  Flow 4.2, Speed 5350, Power 4.0  Pulse Index 4.8  VAD interrogated personally. Parameters stable.  Objective:   Weight Range:  Vital Signs:   Temp:  [96.5 F (35.8 C)-99.4 F (37.4 C)] 97.9 F (36.6 C) (04/26 0415) Pulse Rate:  [66-123] 98 (04/26 0800) Resp:  [10-28] 15 (04/26 0800) BP: (80-114)/(50-87) 105/82 (04/26 0800) SpO2:  [90 %-100 %] 96 % (04/26 0800) Arterial Line BP: (82-125)/(54-77) 125/74 (04/26 0800) FiO2 (%):  [40 %-60 %] 50 % (04/26 0344) Weight:  [99.9 kg] 99.9 kg (04/26 0615) Last BM Date: 04/07/20  Weight change: Filed Weights   04/04/20 0300 04/05/20 0500 04/07/20 0615  Weight: 105.9 kg 104.5 kg 99.9 kg    Intake/Output:   Intake/Output Summary (Last 24 hours) at 04/07/2020 0919 Last data filed at 04/07/2020 0900 Gross per 24 hour  Intake 2767.7 ml  Output 3124 ml  Net -356.3 ml       Physical Exam: General: In bed. Extubated. Following commands HEENT: normal  Neck: supple. RIJ TLC  Carotids 2+ bilat; no bruits. No  lymphadenopathy or thryomegaly appreciated. Cor: LVAD hum.  Lungs: Clear.Decreased left base Abdomen: obese soft, nontender, non-distended. No hepatosplenomegaly. No bruits or masses. Good bowel sounds. Driveline site clean. Anchor in place.  Extremities: no cyanosis, clubbing, rash. Warm no edema  Neuro: alert & oriented x 3. No focal deficits. Moves all 4 without problem     Telemetry: A fib 80-90s Personally reviewed    Labs: Basic Metabolic Panel: Recent Labs  Lab 04/03/20 0329 04/03/20 1607 04/04/20 0332 04/04/20 0337 04/05/20 0323 04/05/20 0323 04/05/20 1548 04/05/20 1548 04/06/20 0256 04/06/20 0256 04/06/20 0312 04/06/20 1556 04/06/20 2004 04/07/20 0311 04/07/20 0802  NA 136   < > 139   < > 138   < > 135   < > 137   < > 135 138 135 136 137  K 5.3*   < > 4.9   < > 5.2*   < > 5.0   < > 4.7   < > 4.6 4.6 4.5 4.5 4.3  CL 102   < > 103   < > 105  --  102  --  104  --   --  103  --  101  --   CO2 25   < > 25   < > 26  --  25  --  25  --   --  25  --  24  --  GLUCOSE 255*   < > 188*   < > 156*  --  240*  --  200*  --   --  93  --  131*  --   BUN 37*   < > 35*   < > 30*  --  30*  --  32*  --   --  31*  --  34*  --   CREATININE 2.53*   < > 2.25*   < > 1.78*  --  1.79*  --  1.80*  --   --  1.86*  --  2.16*  --   CALCIUM 7.9*   < > 8.2*   < > 8.4*   < > 8.2*   < > 8.1*  --   --  8.4*  --  8.1*  --   MG 2.7*  --  2.7*  --  2.6*  --   --   --  2.6*  --   --   --   --  2.5*  --   PHOS 4.0   < > 3.2   < > 2.7  --  2.2*  --  2.2*  --   --  3.6  --  3.2  --    < > = values in this interval not displayed.    Liver Function Tests: Recent Labs  Lab 04/02/20 0429 04/02/20 1620 04/03/20 0329 04/03/20 1607 04/04/20 0332 04/04/20 1610 04/05/20 0323 04/05/20 1548 04/06/20 0256 04/06/20 1556 04/07/20 0311  AST 35  --  37  --  35  --  33  --  34  --   --   ALT 20  --  22  --  22  --  23  --  21  --   --   ALKPHOS 78  --  119  --  112  --  142*  --  129*  --   --   BILITOT  1.4*  --  0.8  --  0.9  --  0.6  --  0.8  --   --   PROT 5.2*  --  5.2*  --  5.6*  --  5.9*  --  5.7*  --   --   ALBUMIN 2.6*  2.6*   < > 2.3*  2.2*   < > 2.2*  2.1*   < > 2.1*  2.2* 2.0* 2.1*  2.2* 2.2* 2.0*   < > = values in this interval not displayed.   No results for input(s): LIPASE, AMYLASE in the last 168 hours. No results for input(s): AMMONIA in the last 168 hours.  CBC: Recent Labs  Lab 04/06/2020 0410 03/24/2020 0512 04/02/20 0429 04/03/20 0318 04/04/20 0332 04/04/20 0337 04/05/20 0323 04/05/20 0323 04/05/20 1804 04/05/20 1804 04/06/20 0256 04/06/20 0312 04/06/20 2004 04/07/20 0311 04/07/20 0802  WBC 19.5*   < > 26.9*   < > 13.1*  --  10.7*  --  7.8  --  8.8  --   --  14.3*  --   NEUTROABS 16.9*  --  24.0*  --   --   --   --   --   --   --   --   --   --   --   --   HGB 8.5*   < > 8.8*   < > 7.7*   < > 7.6*   < > 8.2*   < > 8.4* 8.5* 9.5* 8.8* 9.5*  HCT 27.0*   < >  27.1*   < > 24.7*   < > 25.1*   < > 27.0*   < > 27.5* 25.0* 28.0* 28.4* 28.0*  MCV 92.5   < > 87.7   < > 94.3  --  95.1  --  94.1  --  92.3  --   --  91.3  --   PLT 153   < > 129*   < > 124*  --  124*  --  120*  --  131*  --   --  151  --    < > = values in this interval not displayed.    Cardiac Enzymes: No results for input(s): CKTOTAL, CKMB, CKMBINDEX, TROPONINI in the last 168 hours.  BNP: BNP (last 3 results) Recent Labs    03/21/20 0209 03/26/20 2329 04/02/20 2342  BNP 315.9* 425.7* 332.7*    ProBNP (last 3 results) No results for input(s): PROBNP in the last 8760 hours.    Other results:  Imaging: DG Chest 1 View  Result Date: 04/07/2020 CLINICAL DATA:  Post open heart surgery EXAM: CHEST  1 VIEW COMPARISON:  04/06/2020 FINDINGS: LVAD and left AICD remain in place, unchanged. Interval removal of endotracheal tube. Right dialysis catheter is unchanged. Cardiomegaly. Worsening aeration in the left lung may reflect a combination of left effusion and atelectasis/consolidation.  Minimal right base atelectasis. Mild vascular congestion. IMPRESSION: Support devices as above, in expected position. Cardiomegaly, vascular congestion. Increasing left lung airspace disease/effusion. Electronically Signed   By: Rolm Baptise M.D.   On: 04/07/2020 08:56   DG Abd 1 View  Result Date: 04/06/2020 CLINICAL DATA:  Enteric catheter placement EXAM: ABDOMEN - 1 VIEW COMPARISON:  04/06/2020, 04/02/2020 FINDINGS: Single frontal view of the lower chest and upper abdomen demonstrates an enteric catheter tip projecting over the gastric antrum/duodenal bulb. Right internal jugular and subclavian catheter is as well as the dual lead pacer and left ventricular assist device unchanged. Cardiac silhouette is enlarged but stable. Persistent left basilar consolidation and likely effusion. IMPRESSION: 1. Enteric catheter overlying gastric antrum. 2. Persistent left basilar consolidation and effusion. 3. Other support devices as above. Electronically Signed   By: Randa Ngo M.D.   On: 04/06/2020 16:02   DG CHEST PORT 1 VIEW  Result Date: 04/06/2020 CLINICAL DATA:  VATS, postoperative EXAM: PORTABLE CHEST 1 VIEW COMPARISON:  04/05/2020 FINDINGS: No significant change in AP portable examination with extensive support apparatus including endotracheal tube, LVAD, right neck and right subclavian vascular catheters, and left-sided chest tube. No significant pneumothorax. Unchanged mild diffuse interstitial opacity. Unchanged left pleural effusion. Cardiomegaly. IMPRESSION: 1. No significant change in AP portable examination with extensive support apparatus detailed above. 2.  Left sided chest tube without significant pneumothorax. 3.  Unchanged mild, diffuse interstitial opacity, likely edema. 4.  Unchanged left pleural effusion. Electronically Signed   By: Eddie Candle M.D.   On: 04/06/2020 12:41     Medications:     Scheduled Medications: . sodium chloride   Intravenous Once  . aspirin  81 mg Per Tube  Daily  . atorvastatin  80 mg Per Tube q1800  . B-complex with vitamin C  1 tablet Per Tube Daily  . bisacodyl  10 mg Oral Daily   Or  . bisacodyl  10 mg Rectal Daily  . chlorhexidine gluconate (MEDLINE KIT)  15 mL Mouth Rinse BID  . Chlorhexidine Gluconate Cloth  6 each Topical Daily  . docusate  200 mg Per Tube Daily  . feeding supplement (  PRO-STAT SUGAR FREE 64)  30 mL Per Tube QID  . influenza vaccine adjuvanted  0.5 mL Intramuscular Tomorrow-1000  . insulin aspart  0-24 Units Subcutaneous Q4H  . insulin aspart  6 Units Subcutaneous Q4H  . insulin glargine  24 Units Subcutaneous BID  . linezolid  600 mg Per Tube Q12H  . mouth rinse  15 mL Mouth Rinse Q2H  . melatonin  3 mg Per Tube QHS  . metoCLOPramide (REGLAN) injection  5 mg Intravenous Q8H  . pantoprazole (PROTONIX) IV  40 mg Intravenous Daily  . polyethylene glycol  17 g Per Tube Daily  . QUEtiapine  25 mg Per Tube QHS  . sildenafil  20 mg Per Tube TID  . sodium chloride flush  10-40 mL Intracatheter Q12H  . Warfarin - Pharmacist Dosing Inpatient   Does not apply q1600    Infusions: .  prismasol BGK 4/2.5 500 mL/hr at 04/07/20 0916  . sodium chloride Stopped (03/22/20 2011)  . sodium chloride Stopped (03/27/20 1523)  . sodium chloride    . sodium chloride 10 mL/hr at 04/05/20 1815  . sodium chloride    . amiodarone 30 mg/hr (04/07/20 0900)  . ceFEPime (MAXIPIME) IV 2 g (04/07/20 0918)  . dexmedetomidine (PRECEDEX) IV infusion Stopped (04/07/20 7782)  . epinephrine Stopped (04/05/20 0854)  . feeding supplement (VITAL 1.5 CAL) Stopped (04/06/20 1500)  . lactated ringers Stopped (03/29/20 0748)  . milrinone 0.25 mcg/kg/min (04/07/20 0900)  . norepinephrine (LEVOPHED) Adult infusion 11 mcg/min (04/07/20 0913)  . prismasol BGK 0/2.5 300 mL/hr at 04/06/20 0612  . prismasol BGK 4/2.5 1,500 mL/hr at 04/07/20 0306  . vasopressin (PITRESSIN) infusion - *FOR SHOCK* Stopped (04/07/20 0515)    PRN Medications: sodium  chloride, acetaminophen, dextrose, fentaNYL (SUBLIMAZE) injection, heparin, heparin, hydrALAZINE, HYDROcodone-acetaminophen, levalbuterol, ondansetron (ZOFRAN) IV, ondansetron (ZOFRAN) IV, oxyCODONE, sodium chloride, sodium chloride flush, sorbitol, traMADol   Assessment/Plan:   1. Acute on chronic systolic HF -> cardiogenic shock-> S/p HM3 LVAD - Echo 2016 EF 30-35% - Echo 1/21 EF 25% - Admitted with NYHA IV symptoms and AKI with attempts at diuresis.  - Echo this admit: EF 10-15% moderate RV dysfunction - R/LHC cath 3/21 with severe 3v CAD and low output with CI 1.7.  - Swan placed. Initial co-ox 39%. Initial PAPi 1.5. Started on milrinone 0.25. - Initially planned for CABG but given need for re-do sternotomy, relatively poor targets and longstanding low EF, VAD felt to be better option  - S/p HM3 VAD 4/8 - Speed turned up on 4/11 to  5400, but looking at 4/10 echo there is significant RV dysfunction with leftward septal shift so speed decreased back to 5300 rpm.  - Developed hemothorax on 4/20 requiring emergent chest tube and VATS - On NE 11 + Milrinone 0.25. Off NE & vaso Co-ox 74%  - Volume status looks good on CVVHD. Remains anuric. CVP 5-6. Will keep even  - Wean Pressors. Keep MAP > 70  2. VAD - VAD interrogated personally. Parameters stable. - Had Pump Stop 04/03/20. Controller changed out with old controller sent back to [facility] for evaluation.  - on aspirin 81 mg daily + coumadin.  - LDH 283 - INR  1.8. Discussed dosing with PharmD personally. - Driveline ok   3. CAD with unstable angina - s/p previous PCI - cath 03/08/2020 with severe 3v CAD - now s/p VAD on 04/07/2020    4. AKI on CKD 3a - Nephrology consulted. Started CVVHD 4/13  - Renal  US 4/5 unremarkable.  - CVP 6-7 . Continue CVVHD. Will keep even today. Keep on CVVHD until pressors off then can try for iHD - Hopefully renal function will recover but Renal team not optimistic  5. Permanent AF - now s/p MAZE + LAA  Clipping 4/8   - Rate controlled - Continue IV amio while on high-dose pressors - Anti-coag as above  6. MVP s/p MV repair - On admit with recurrent severe MR on echo and with huge v-waves on PCWP tracing - s/p VAD  7. Hemothoax - s/p CT placement and VATs on 4/22 - CTs pulled this am  - CXR still with moderate effusion. Follow  8. ID/leukocytosis - 4/11 procalcitonin 1.96  - Started empiric coverage for PNA with vancomycin/cefepime for 7 days. Stopped 4/17 - Given recent VATs and high-dose pressor requirement will cover with broad spectrum abx for now, linezolid and cefepime. WBC 8.8->14.3. follow  9. Acute Hypoxic Respiratory Failure - Re-intubated 4/20 with hemothorax- - Extubated 4/25 - Stable on [place] today  10. Malnutrition  - Albumin 2.9   - On tube feeds.    11. CVA/anoxic brain injury - 03/27/20 CT large posterior MCA ischemic infarct, possibly from atrial fibrillation.  INR was supratherapeutic when CVA found.  Stable LDH, do not think partial pump thrombosis is the culprit. - awake and moving all 4 extremities today.  - PT/OT/speech to see  12. Anemia, post-op  - hgb 8.8 -> 8.4 after 1u RBC 04/05/20  13. Hyperkalemia - Improved 4.5 today. continue CVVHD  14. DM2, poorly controlled - hgbA1c 11.1% - insulin on hold due to hypoglycemia   Length of Stay: 26   CRITICAL CARE Performed by: Glori Bickers  Total critical care time: 35 minutes  Critical care time was exclusive of separately billable procedures and treating other patients.  Critical care was necessary to treat or prevent imminent or life-threatening deterioration.  Critical care was time spent personally by me (independent of midlevel providers or residents) on the following activities: development of treatment plan with patient and/or surrogate as well as nursing, discussions with consultants, evaluation of patient's response to treatment, examination of patient, obtaining history from patient  or surrogate, ordering and performing treatments and interventions, ordering and review of laboratory studies, ordering and review of radiographic studies, pulse oximetry and re-evaluation of patient's condition.     Glori Bickers MD   04/07/2020, 9:19 AM  Advanced Heart Failure Team Pager [phone] (M-F; 7a - 4p)  Please contact [facility] for night-coverage after hours (4p -7a ) and weekends on [email or web address]

## 2020-04-07 NOTE — Progress Notes (Signed)
ANTICOAGULATION CONSULT NOTE  Pharmacy Consult for warfarin Indication: post op LVAD  / Aflutter  Allergies  Allergen Reactions  . Liraglutide Nausea And Vomiting  . Lisinopril Cough    Patient Measurements: Height: 6' (182.9 cm) Weight: 99.9 kg (220 lb 3.8 oz) IBW/kg (Calculated) : 77.6 Heparin Dosing Weight: 101.2 kg  Vital Signs: Temp: 97.9 F (36.6 C) (04/26 0415) Temp Source: Axillary (04/26 0415) BP: 108/86 (04/26 1000) Pulse Rate: 92 (04/26 1000)  Labs: Recent Labs    04/05/20 0323 04/05/20 1548 04/05/20 1804 04/05/20 1804 04/06/20 0256 04/06/20 0312 04/06/20 1556 04/06/20 2004 04/06/20 2004 04/07/20 0311 04/07/20 0802  HGB 7.6*  --  8.2*   < > 8.4*   < >  --  9.5*   < > 8.8* 9.5*  HCT 25.1*  --  27.0*   < > 27.5*   < >  --  28.0*  --  28.4* 28.0*  PLT 124*  --  120*  --  131*  --   --   --   --  151  --   LABPROT 21.3*  --   --   --  19.3*  --   --   --   --  20.7*  --   INR 1.9*  --   --   --  1.6*  --   --   --   --  1.8*  --   CREATININE 1.78*   < >  --    < > 1.80*  --  1.86*  --   --  2.16*  --    < > = values in this interval not displayed.    Estimated Creatinine Clearance: 38.9 mL/min (A) (by C-G formula based on SCr of 2.16 mg/dL (H)).   Medical History: Past Medical History:  Diagnosis Date  . Anxiety   . Arthritis   . CAD (coronary artery disease)    a.  1993 s/p MI - [employer];  b. s/p BMS to LAD '00;  c. PTCA 2nd diagonal 2010;  d. 02/18/12 Cath: moderate nonobs dzs - med rx;  e.  01/2015 Cath: LM nl, LAD 40-47m ISR, 70-55m/d, d1 90p (3.0x16 Synergy DES), D2 50-60, LCX nl, OM1 50p, 66m (2.5x12 Synergy DES), RCA nl, EF 30-35%.  . Chronic combined systolic and diastolic CHF (congestive heart failure) ([place])    a. 12/2014 Echo: EF 30-35%, Gr2 DD, mod MR, sev dil LA.  . CKD (chronic kidney disease), stage III   . Depression   . ED (erectile dysfunction)   . GERD (gastroesophageal reflux disease)   . Hyperlipidemia   . Hypertension    . Ischemic cardiomyopathy    a. s/p St. [name] (Atlas) ICD implanted in [place] 2007;  b. 12/2014 Echo: Ef 30-35%.  . MVP (mitral valve prolapse)    a. s/p MV annuloplasty at [facility] 2004.  Marland Kitchen Persistent atrial fibrillation ([place])    a. noted on ICD interrogation '10 - not previously on [place] - CHA2DS2VASc = 5.  . Type II diabetes mellitus ([place])    uncontrolled    Medications:  Medications Prior to Admission  Medication Sig Dispense Refill Last Dose  . ALPRAZolam (XANAX) 0.25 MG tablet Take 0.25 mg by mouth 3 (three) times daily as needed for anxiety.    unknown  . amiodarone (PACERONE) 200 MG tablet Take 200 mg by mouth daily.    03/11/2020 at Unknown time  . apixaban (ELIQUIS) 5 MG TABS tablet Take 1 tablet (5  mg total) by mouth 2 (two) times daily. 60 tablet 1 03/11/2020 at 0600  . atorvastatin (LIPITOR) 40 MG tablet Take 1 tablet (40 mg total) by mouth daily at 6 PM. 30 tablet 0 03/10/2020 at Unknown time  . citalopram (CELEXA) 20 MG tablet Take 20 mg by mouth daily.    03/11/2020 at Unknown time  . furosemide (LASIX) 40 MG tablet Take 40 mg by mouth daily.    03/11/2020 at Unknown time  . insulin aspart (NOVOLOG FLEXPEN) 100 UNIT/ML FlexPen Inject 6 Units into the skin 3 (three) times daily with meals. 15 mL 0 03/11/2020 at Unknown time  . Insulin Glargine (BASAGLAR KWIKPEN) 100 UNIT/ML SOPN Inject 0.5 mLs (50 Units total) into the skin daily. 15 mL 1 03/11/2020 at Unknown time  . metoprolol succinate (TOPROL-XL) 100 MG 24 hr tablet Take 1 tablet (100 mg total) by mouth daily. 90 tablet 3 03/11/2020 at 0600  . nitroGLYCERIN (NITROSTAT) 0.4 MG SL tablet Place 1 tablet (0.4 mg total) under the tongue every 5 (five) minutes x 3 doses as needed for chest pain. 10 tablet 0 unknown  . pantoprazole (PROTONIX) 40 MG tablet Take 1 tablet (40 mg total) by mouth daily. 30 tablet 1 03/11/2020 at Unknown time  . potassium chloride SA (KLOR-CON) 20 MEQ tablet Take 20 mEq by mouth daily.    03/11/2020 at  Unknown time  . tamsulosin (FLOMAX) 0.4 MG CAPS capsule Take 1 capsule (0.4 mg total) by mouth daily. 30 capsule 0 03/11/2020 at Unknown time  . ALPRAZolam (XANAX) 1 MG tablet Take 1 tablet (1 mg total) by mouth 2 (two) times daily as needed for anxiety. (Patient not taking: Reported on 03/08/2020) 10 tablet 0 Not Taking at Unknown time    Assessment: 33 YOM with atrial fibrillation on Eliquis PTA s/p R & L heart cath found to have 3v disease s/p  LVAD  Implant 4/8. Warfarin initiated on 4/9 after discussing with MD.  CT on 4/15 now found to have large right MCA stroke. INR had been elevated up to 9.0 postop, he received 1 dose of vitamin K 1 mg IV on 4/13 and again on 4/15 with INR still elevated at 6.2.   Warfarin resumed cautiously on 4/17. Held doses since 4/20 since he went to the OR for VATS. Warfarin was cautiously resumed on 4/23.  INR is now below goal at 1.8. Hgb stable 9.5, Pltc stable 151. LDH stable 283. No bleeding noted.  Goal of Therapy:  INR 2-2.5 Monitor platelets by anticoagulation protocol: Yes   Plan:  -Warfarin 1 mg x1 today -Daily INR and CBC  Antonietta Jewel, PharmD, BCCCP Clinical Pharmacist  Phone: [phone] 04/07/2020 10:41 AM  Please check AMION for all [place] phone numbers After 10:00 PM, call [place] [phone]

## 2020-04-08 ENCOUNTER — Ambulatory Visit: Payer: Medicare HMO | Admitting: Cardiology

## 2020-04-08 ENCOUNTER — Inpatient Hospital Stay (HOSPITAL_COMMUNITY): Payer: Medicare HMO

## 2020-04-08 DIAGNOSIS — R570 Cardiogenic shock: Secondary | ICD-10-CM | POA: Diagnosis not present

## 2020-04-08 LAB — GLUCOSE, CAPILLARY
Glucose-Capillary: 109 mg/dL — ABNORMAL HIGH (ref 70–99)
Glucose-Capillary: 128 mg/dL — ABNORMAL HIGH (ref 70–99)
Glucose-Capillary: 157 mg/dL — ABNORMAL HIGH (ref 70–99)
Glucose-Capillary: 159 mg/dL — ABNORMAL HIGH (ref 70–99)
Glucose-Capillary: 171 mg/dL — ABNORMAL HIGH (ref 70–99)
Glucose-Capillary: 75 mg/dL (ref 70–99)
Glucose-Capillary: 87 mg/dL (ref 70–99)

## 2020-04-08 LAB — RENAL FUNCTION PANEL
Albumin: 2 g/dL — ABNORMAL LOW (ref 3.5–5.0)
Albumin: 2.2 g/dL — ABNORMAL LOW (ref 3.5–5.0)
Anion gap: 11 (ref 5–15)
Anion gap: 9 (ref 5–15)
BUN: 32 mg/dL — ABNORMAL HIGH (ref 8–23)
BUN: 33 mg/dL — ABNORMAL HIGH (ref 8–23)
CO2: 25 mmol/L (ref 22–32)
CO2: 26 mmol/L (ref 22–32)
Calcium: 8.4 mg/dL — ABNORMAL LOW (ref 8.9–10.3)
Calcium: 8.4 mg/dL — ABNORMAL LOW (ref 8.9–10.3)
Chloride: 101 mmol/L (ref 98–111)
Chloride: 103 mmol/L (ref 98–111)
Creatinine, Ser: 1.88 mg/dL — ABNORMAL HIGH (ref 0.61–1.24)
Creatinine, Ser: 1.91 mg/dL — ABNORMAL HIGH (ref 0.61–1.24)
GFR calc Af Amer: 41 mL/min — ABNORMAL LOW (ref >60)
GFR calc Af Amer: 40 mL/min — ABNORMAL LOW (ref >60)
GFR calc non Af Amer: 35 mL/min — ABNORMAL LOW (ref >60)
GFR calc non Af Amer: 35 mL/min — ABNORMAL LOW (ref >60)
Glucose, Bld: 132 mg/dL — ABNORMAL HIGH (ref 70–99)
Glucose, Bld: 103 mg/dL — ABNORMAL HIGH (ref 70–99)
Phosphorus: 2.6 mg/dL (ref 2.5–4.6)
Phosphorus: 2.7 mg/dL (ref 2.5–4.6)
Potassium: 4 mmol/L (ref 3.5–5.1)
Potassium: 4.3 mmol/L (ref 3.5–5.1)
Sodium: 137 mmol/L (ref 135–145)
Sodium: 138 mmol/L (ref 135–145)

## 2020-04-08 LAB — CBC
HCT: 28.3 % — ABNORMAL LOW (ref 39.0–52.0)
Hemoglobin: 8.9 g/dL — ABNORMAL LOW (ref 13.0–17.0)
MCH: 29.1 pg (ref 26.0–34.0)
MCHC: 31.4 g/dL (ref 30.0–36.0)
MCV: 92.5 fL (ref 80.0–100.0)
Platelets: 152 10*3/uL (ref 150–400)
RBC: 3.06 MIL/uL — ABNORMAL LOW (ref 4.22–5.81)
RDW: 15.5 % (ref 11.5–15.5)
WBC: 11.9 10*3/uL — ABNORMAL HIGH (ref 4.0–10.5)
nRBC: 0.3 % — ABNORMAL HIGH (ref 0.0–0.2)

## 2020-04-08 LAB — COOXEMETRY PANEL
Carboxyhemoglobin: 2 % — ABNORMAL HIGH (ref 0.5–1.5)
Methemoglobin: 1 % (ref 0.0–1.5)
O2 Saturation: 77.4 %
Total hemoglobin: 9.1 g/dL — ABNORMAL LOW (ref 12.0–16.0)

## 2020-04-08 LAB — LACTATE DEHYDROGENASE: LDH: 273 U/L — ABNORMAL HIGH (ref 98–192)

## 2020-04-08 LAB — PROTIME-INR
INR: 2.8 — ABNORMAL HIGH (ref 0.8–1.2)
Prothrombin Time: 29.8 seconds — ABNORMAL HIGH (ref 11.4–15.2)

## 2020-04-08 LAB — MAGNESIUM: Magnesium: 2.5 mg/dL — ABNORMAL HIGH (ref 1.7–2.4)

## 2020-04-08 MED ORDER — WARFARIN 0.5 MG HALF TABLET
0.5000 mg | ORAL_TABLET | Freq: Once | ORAL | Status: DC
  Filled 2020-04-08: qty 1

## 2020-04-08 MED ORDER — PRISMASOL BGK 4/2.5 32-4-2.5 MEQ/L REPLACEMENT SOLN: Status: DC

## 2020-04-08 MED ORDER — ALBUMIN HUMAN 25 % IV SOLN
12.5000 g | Freq: Four times a day (QID) | INTRAVENOUS | Status: AC
  Administered 2020-04-08 (×3): 12.5 g via INTRAVENOUS
  Filled 2020-04-08 (×3): qty 50

## 2020-04-08 MED ORDER — SODIUM PHOSPHATES 45 MMOLE/15ML IV SOLN
10.0000 mmol | Freq: Once | INTRAVENOUS | Status: AC
  Administered 2020-04-08: 07:00:00 10 mmol via INTRAVENOUS
  Filled 2020-04-08: qty 3.33

## 2020-04-08 MED ORDER — SODIUM CHLORIDE 0.9 % IV BOLUS
250.0000 mL | Freq: Once | INTRAVENOUS | Status: AC
  Administered 2020-04-08: 250 mL via INTRAVENOUS

## 2020-04-08 MED ORDER — DARBEPOETIN ALFA 40 MCG/0.4ML IJ SOSY
40.0000 ug | PREFILLED_SYRINGE | Freq: Once | INTRAMUSCULAR | Status: AC
  Administered 2020-04-08: 11:00:00 40 ug via SUBCUTANEOUS
  Filled 2020-04-08: qty 0.4

## 2020-04-08 NOTE — Progress Notes (Signed)
Admit: 02/13/2020 LOS: 55  87M dialysis dependent AKI on CRRT, acute CHF exacerbation s/p VAD, status post MV repair, AFib s/p MAZE and LAA clipping 4/8; R MCA CVA 4/15; s/p hemothorax and VATS 4/20  S: CRRT just clotted - new system being set up.  Had 2.9 liters UF over 4/26 with CRRT as charted thus far.  Anuric.  Was kept even.  CVP 6 overnight per nursing.    On levo at 14 and milrinone.    Review of systems:  Denies overt shortness of breath  "I'm fine right now" Denies n/v    O: 04/26 0701 - 04/27 0700 In: 2692.6 [I.V.:1680.1; NG/GT:812.5; IV Piggyback:200] Out: 3002 [Stool:150]  Filed Weights   04/04/20 0300 04/05/20 0500 04/07/20 0615  Weight: 105.9 kg 104.5 kg 99.9 kg   Physical exam General adult male in bed critically ill  HEENT normocephalic atraumatic  Lungs coarse breath sounds  Heart S1S2 on levo and milrinone Abdomen soft nontender nondistended Extremities no pitting edema  Neuro - awakens to voice; answers questions briefly Access: right [place] nontunneled catheter     Recent Labs  Lab 04/07/20 0311 04/07/20 0311 04/07/20 0802 04/07/20 1536 04/08/20 0350  NA 136   < > 137 137 137  K 4.5   < > 4.3 4.6 4.0  CL 101  --   --  101 101  CO2 24  --   --  24 25  GLUCOSE 131*  --   --  195* 132*  BUN 34*  --   --  32* 32*  CREATININE 2.16*  --   --  1.96* 1.88*  CALCIUM 8.1*  --   --  8.5* 8.4*  PHOS 3.2  --   --  3.3 2.6   < > = values in this interval not displayed.   Recent Labs  Lab 04/02/20 0429 04/03/20 0318 04/06/20 0256 04/06/20 0312 04/07/20 0311 04/07/20 0802 04/08/20 0350  WBC 26.9*   < > 8.8  --  14.3*  --  11.9*  NEUTROABS 24.0*  --   --   --   --   --   --   HGB 8.8*   < > 8.4*   < > 8.8* 9.5* 8.9*  HCT 27.1*   < > 27.5*   < > 28.4* 28.0* 28.3*  MCV 87.7   < > 92.3  --  91.3  --  92.5  PLT 129*   < > 131*  --  151  --  152   < > = values in this interval not displayed.    Scheduled Meds: . sodium chloride   Intravenous Once  .  aspirin  81 mg Per Tube Daily  . atorvastatin  80 mg Per Tube q1800  . B-complex with vitamin C  1 tablet Per Tube Daily  . bisacodyl  10 mg Oral Daily   Or  . bisacodyl  10 mg Rectal Daily  . Chlorhexidine Gluconate Cloth  6 each Topical Daily  . docusate  200 mg Per Tube Daily  . feeding supplement (PRO-STAT SUGAR FREE 64)  30 mL Per Tube QID  . influenza vaccine adjuvanted  0.5 mL Intramuscular Tomorrow-1000  . insulin aspart  0-24 Units Subcutaneous Q4H  . insulin aspart  6 Units Subcutaneous Q4H  . insulin glargine  24 Units Subcutaneous BID  . linezolid  600 mg Per Tube Q12H  . mouth rinse  15 mL Mouth Rinse BID  . melatonin  3 mg Per  Tube QHS  . metoCLOPramide (REGLAN) injection  5 mg Intravenous Q8H  . pantoprazole (PROTONIX) IV  40 mg Intravenous Daily  . polyethylene glycol  17 g Per Tube Daily  . QUEtiapine  25 mg Per Tube QHS  . sildenafil  20 mg Per Tube TID  . sodium chloride flush  10-40 mL Intracatheter Q12H  . Warfarin - Pharmacist Dosing Inpatient   Does not apply q1600   Continuous Infusions: .  prismasol BGK 4/2.5 500 mL/hr at 04/08/20 0357  . sodium chloride Stopped (03/22/20 2011)  . sodium chloride 10 mL/hr at 04/07/20 2209  . sodium chloride    . sodium chloride 10 mL/hr at 04/05/20 1815  . sodium chloride    . amiodarone 30 mg/hr (04/08/20 0500)  . ceFEPime (MAXIPIME) IV Stopped (04/07/20 2159)  . dexmedetomidine (PRECEDEX) IV infusion Stopped (04/07/20 2878)  . epinephrine Stopped (04/05/20 0854)  . feeding supplement (VITAL 1.5 CAL) 1,000 mL (04/07/20 1245)  . lactated ringers Stopped (03/29/20 0748)  . milrinone 0.25 mcg/kg/min (04/08/20 0500)  . norepinephrine (LEVOPHED) Adult infusion 18 mcg/min (04/08/20 0500)  . prismasol BGK 0/2.5 300 mL/hr at 04/08/20 0357  . prismasol BGK 4/2.5 1,500 mL/hr at 04/08/20 0356  . vasopressin (PITRESSIN) infusion - *FOR SHOCK* Stopped (04/07/20 0515)   PRN Meds:.sodium chloride, acetaminophen, dextrose,  fentaNYL (SUBLIMAZE) injection, heparin, heparin, hydrALAZINE, HYDROcodone-acetaminophen, levalbuterol, ondansetron (ZOFRAN) IV, ondansetron (ZOFRAN) IV, oxyCODONE, sodium chloride, sodium chloride flush, sorbitol, traMADol  ABG    Component Value Date/Time   PHART 7.383 04/07/2020 0802   PCO2ART 45.2 04/07/2020 0802   PO2ART 72 (L) 04/07/2020 0802   HCO3 27.3 04/07/2020 0802   TCO2 29 04/07/2020 0802   ACIDBASEDEF 2.0 04/02/2020 1405   O2SAT 77.4 04/08/2020 0339    A/P  1. Anuric Dialysis dependent AKI.  CRRT started 4/13.  on CRRT.  UF is currently at keep even.  Normal saline 250 mL once now and do not remove this with CRRT - spoke with nursing.  Please contact nephrology for any UF goal changes. Currently with 4K pre/D and 0K post. Transition to all 4K fluids. For now will keep on CRRT while on pressors then assess transition to iHD.  no circuit anticoagluation.  systemic anticoagulation on hold after hemothorax 2. Hypophosphatemia, replete prn for P < 2.5. go ahead with gentle phos now. 3. sCHF s/p LVAD 4/8 4. Hemothorax s/p VATS 03/31/2020.  5. AFib s/p MAZE and LAA clipping 4/8 6. DM2 - per primary team  7. R MCA ischemic CVA - noted  8. Cardiogenic Shock - on norepi and milrinone 9. Anemia - normocytic - no acute indication for PRBC's.  Start ESA  - aranesp 40 mcg IV once on 4/27  Claudia Desanctis, MD 04/08/2020 6:05 AM

## 2020-04-08 NOTE — Progress Notes (Addendum)
ANTICOAGULATION CONSULT NOTE  Pharmacy Consult for warfarin Indication: post op LVAD  / Aflutter  Allergies  Allergen Reactions  . Liraglutide Nausea And Vomiting  . Lisinopril Cough    Patient Measurements: Height: 6' (182.9 cm) Weight: 100 kg (220 lb 7.4 oz) IBW/kg (Calculated) : 77.6 Heparin Dosing Weight: 101.2 kg  Vital Signs: Temp: 97.6 F (36.4 C) (04/27 0400) Temp Source: Oral (04/27 0400) BP: 99/71 (04/27 0600) Pulse Rate: 89 (04/27 0630)  Labs: Recent Labs    04/06/20 0256 04/06/20 0312 04/07/20 0311 04/07/20 0311 04/07/20 0802 04/07/20 1536 04/08/20 0350  HGB 8.4*   < > 8.8*   < > 9.5*  --  8.9*  HCT 27.5*   < > 28.4*  --  28.0*  --  28.3*  PLT 131*  --  151  --   --   --  152  LABPROT 19.3*  --  20.7*  --   --   --  29.8*  INR 1.6*  --  1.8*  --   --   --  2.8*  CREATININE 1.80*   < > 2.16*  --   --  1.96* 1.88*   < > = values in this interval not displayed.    Estimated Creatinine Clearance: 44.8 mL/min (A) (by C-G formula based on SCr of 1.88 mg/dL (H)).   Medical History: Past Medical History:  Diagnosis Date  . Abdominal pain 01/05/2020  . Abnormal nuclear stress test 01/14/2015  . Acute on chronic combined systolic and diastolic CHF (congestive heart failure) ([place])   . Acute respiratory failure with hypoxia ([place])   . AKI (acute kidney injury) ([place])   . Anxiety   . Arthritis   . Atrial fibrillation ([place]) 11/29/2016  . Atrial fibrillation with RVR ([place])   . Atrial fibrillation, chronic ([place]) 11/29/2016   Last Assessment & Plan:  Formatting of this note might be different from the original. -continue present medications with warfarin anti-coagulation Last Assessment & Plan:  Formatting of this note might be different from the original. -chronic -anti-coagulated -rate controlled  . Automatic implantable cardioverter-defibrillator in situ 08/18/2011   Formatting of this note might be different from the original. Last Assessment & Plan:  Now followed  in ICD clinic. He has followup in January.  Last Assessment & Plan:  Formatting of this note might be different from the original. -historical note  . Benign prostatic hyperplasia   . Bipolar I disorder with depression ([place]) 01/06/2011   Last Assessment & Plan:  -historical note in chart -continue home medications  Last Assessment & Plan:  Formatting of this note might be different from the original. -historical note in chart -continue home medications  . CAD (coronary artery disease)    a.  1993 s/p MI - [employer];  b. s/p BMS to LAD '00;  c. PTCA 2nd diagonal 2010;  d. 02/18/12 Cath: moderate nonobs dzs - med rx;  e.  01/2015 Cath: LM nl, LAD 40-61m ISR, 70-70m/d, d1 90p (3.0x16 Synergy DES), D2 50-60, LCX nl, OM1 50p, 62m (2.5x12 Synergy DES), RCA nl, EF 30-35%.  . Cardiogenic shock ([place]) 03/21/2020  . Chronic anticoagulation 09/22/2018   Last Assessment & Plan:  -continue warfarin for atrial fibrillation with pharmacy to manage  Last Assessment & Plan:  Formatting of this note might be different from the original. -continue warfarin for atrial fibrillation with pharmacy to manage  . Chronic combined systolic and diastolic CHF (congestive heart failure) ([place])    a. 12/2014  Echo: EF 30-35%, Gr2 DD, mod MR, sev dil LA.  Marland Kitchen Chronic congestive heart failure ([place]) 04/22/2015   Last Assessment & Plan:  Formatting of this note might be different from the original. -known EF 20-25% -continue medical management -appears stable clinically  Last Assessment & Plan:  Formatting of this note might be different from the original. -noted with abnormal EF and chronic activity restrictions -medically manage  . Chronic systolic dysfunction of left ventricle 07/25/2013  . CKD (chronic kidney disease) 09/22/2018  . CKD (chronic kidney disease), stage III   . Coagulopathy ([place])   . COVID-19 04/17/2019   Last Assessment & Plan:  -Apparent scattered infiltrates on CT chest.  -SARS Co V positive -afebrile and not hypoxic at  rest; does qualify for home o2 with sats 88% on room air with activity -home today on self-quarantine x 14 days -finish azithromycin at home -ask Home Health to check on patient Last Assessment & Plan:  -has persistently positive test since initial verification 04/16/19 -contract/d  . Depression   . Diabetic retinopathy associated with type 2 diabetes mellitus ([place]) 01/30/2020  . Diarrhea 01/05/2020  . Dizziness 09/27/2016  . DM (diabetes mellitus), type 2 ([place]) 03/19/2013   Last Assessment & Plan:  Formatting of this note might be different from the original. -hold metformin -reduce insulin with accuchecks and SSI -diet consult -HbA1c  . DNR (do not resuscitate) discussion 03/18/2013  . ED (erectile dysfunction)   . Elevated troponin 09/22/2018   Last Assessment & Plan:  Formatting of this note might be different from the original. -trend -mild elevation not thought to be ischemic on admission from history  . GERD (gastroesophageal reflux disease)   . Hyperlipidemia   . Hypertension   . ICD (implantable cardiac defibrillator) battery depletion 03/18/2013  . IHD (ischemic heart disease) 01/08/2019   Formatting of this note might be different from the original. Last Assessment & Plan:  Minimal symptoms of angina. He is on Ranexa, isosorbide, and a beta blocker. We'll try to maximize his blood pressure therapy. I stressed the importance of regular daily aerobic exercise. He needs to follow a heart healthy/diabetic diet.  . Ischemic cardiomyopathy    a. s/p St. [name] (Atlas) ICD implanted in [place] 2007;  b. 12/2014 Echo: Ef 30-35%.  . Ischemic dilated cardiomyopathy ([place]) 04/09/2020  . LVAD (left ventricular assist device) present ([place]) 03/21/2020  . MVP (mitral valve prolapse)    a. s/p MV annuloplasty at [facility] 2004.  . Non-rheumatic mitral regurgitation 01/08/2019   Formatting of this note might be different from the original. Last Assessment & Plan:  He does have a murmur of mitral insufficiency by  exam. We will followup an echocardiogram to assess the success of his mitral valve repair.  . Other chest pain 06/01/2019   Last Assessment & Plan:  Formatting of this note might be different from the original. -known COVID19 with known CAD and cardiomyopathy -trend troponins -consider other etiologies like cardiac involvement with COVID19 -consult DR Gavin Potters -consider echo (although none available until 6/22) -await CT thorax result  . Palliative care by specialist   . Persistent atrial fibrillation ([place])    a. noted on ICD interrogation '10 - not previously on [place] - CHA2DS2VASc = 5.  . Pneumonia, unspecified organism 09/22/2018  . S/P coronary artery stent placement   . Severe episode of recurrent major depressive disorder, without psychotic features ([place]) 02/07/2020  . Shortness of breath   . Syncope 09/15/2012  . Tick  bite 06/02/2019   Last Assessment & Plan:  -reported by ER staff; had tick on umbilicus (removed) -monitor for any signs requiring treatment (I.e. rash, fever)  Last Assessment & Plan:  Formatting of this note might be different from the original. -reported by ER staff; had tick on umbilicus (removed) -monitor for any signs requiring treatment (I.e. rash, fever)  . TSH elevation   . Type II diabetes mellitus ([facility])    uncontrolled  . Unstable angina ([place]) 02/19/2012    Medications:  Medications Prior to Admission  Medication Sig Dispense Refill Last Dose  . ALPRAZolam (XANAX) 0.25 MG tablet Take 0.25 mg by mouth 3 (three) times daily as needed for anxiety.    unknown  . amiodarone (PACERONE) 200 MG tablet Take 200 mg by mouth daily.    03/11/2020 at Unknown time  . apixaban (ELIQUIS) 5 MG TABS tablet Take 1 tablet (5 mg total) by mouth 2 (two) times daily. 60 tablet 1 03/11/2020 at 0600  . atorvastatin (LIPITOR) 40 MG tablet Take 1 tablet (40 mg total) by mouth daily at 6 PM. 30 tablet 0 03/10/2020 at Unknown time  . citalopram (CELEXA) 20 MG tablet Take 20 mg by mouth daily.     03/11/2020 at Unknown time  . furosemide (LASIX) 40 MG tablet Take 40 mg by mouth daily.    03/11/2020 at Unknown time  . insulin aspart (NOVOLOG FLEXPEN) 100 UNIT/ML FlexPen Inject 6 Units into the skin 3 (three) times daily with meals. 15 mL 0 03/11/2020 at Unknown time  . Insulin Glargine (BASAGLAR KWIKPEN) 100 UNIT/ML SOPN Inject 0.5 mLs (50 Units total) into the skin daily. 15 mL 1 03/11/2020 at Unknown time  . metoprolol succinate (TOPROL-XL) 100 MG 24 hr tablet Take 1 tablet (100 mg total) by mouth daily. 90 tablet 3 03/11/2020 at 0600  . nitroGLYCERIN (NITROSTAT) 0.4 MG SL tablet Place 1 tablet (0.4 mg total) under the tongue every 5 (five) minutes x 3 doses as needed for chest pain. 10 tablet 0 unknown  . pantoprazole (PROTONIX) 40 MG tablet Take 1 tablet (40 mg total) by mouth daily. 30 tablet 1 03/11/2020 at Unknown time  . potassium chloride SA (KLOR-CON) 20 MEQ tablet Take 20 mEq by mouth daily.    03/11/2020 at Unknown time  . tamsulosin (FLOMAX) 0.4 MG CAPS capsule Take 1 capsule (0.4 mg total) by mouth daily. 30 capsule 0 03/11/2020 at Unknown time  . ALPRAZolam (XANAX) 1 MG tablet Take 1 tablet (1 mg total) by mouth 2 (two) times daily as needed for anxiety. (Patient not taking: Reported on 03/11/2020) 10 tablet 0 Not Taking at Unknown time    Assessment: 21 YOM with atrial fibrillation on Eliquis PTA s/p R & L heart cath found to have 3v disease s/p  LVAD  Implant 4/8. Warfarin initiated on 4/9 after discussing with MD.  CT on 4/15 now found to have large right MCA stroke. INR had been elevated up to 9.0 postop, he received 1 dose of vitamin K 1 mg IV on 4/13 and again on 4/15 with INR still elevated at 6.2.   Warfarin resumed cautiously on 4/17. Held doses since 4/20 since he went to the OR for VATS. Warfarin was cautiously resumed on 4/23.  INR is above goal at 2.8. Hgb stable 8.9, Pltc stable 152. LDH stable 273. No bleeding noted.  Goal of Therapy:  INR 2-2.5 Monitor platelets by  anticoagulation protocol: Yes   Plan:  -hold warfarin today -Daily INR  and CBC  Vertis Kelch, PharmD, [facility] PGY2 Cardiology Pharmacy Resident Phone [phone] 04/08/2020       7:50 AM  Please check [email or web address] for unit-specific pharmacist phone numbers

## 2020-04-08 NOTE — Progress Notes (Signed)
Patient ID: [name] [name], male   DOB: [birth year], 69 y.o.   MRN: [id number]    Advanced Heart Failure Rounding Note   Subjective:    - S/p HM3 VAD 4/8 w MAZE procedure + LAA clipping. - Extubated 4/9 - 4/12 LVAD speed increased to 5400 --> echo moderate-severe RV dysfunction, septum mildly left shifted, trivial pericardial effusion.  LVAD speed cut back to 5300.  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  - 4/20 developed hemothorax requiring emergent CT placement and eventual VATS - 4/21 EEG done given concerns for posturing and c/w moderate to severe diffuse encephalopathy. No seizures or epileptiform discharges were seen  - 04/03/20 Pump Stop --> controller change out. CT of head unchanged R MCA CVA - 04/06/20 Extubated  On NE 12 mcg milrinone 0.25 . CO-OX 77%  Remains anuric. On CVVHD keeping even. CVP 6-7   Denies Pain. Wants to sleep a little longer.   VAD Interrogation  Flow 4.2, Speed 5350, Power 4.0  Pulse Index 4.5  VAD interrogated personally. Parameters stable.  Objective:   Weight Range:  Vital Signs:   Temp:  [96.2 F (35.7 C)-98.4 F (36.9 C)] 98.4 F (36.9 C) (04/27 0818) Pulse Rate:  [81-118] 96 (04/27 0818) Resp:  [9-23] 14 (04/27 0818) BP: (77-125)/(65-87) 99/71 (04/27 0600) SpO2:  [90 %-99 %] 97 % (04/27 0818) Arterial Line BP: (66-122)/(50-81) 110/65 (04/27 0630) Weight:  [100 kg] 100 kg (04/27 0600) Last BM Date: 04/07/20  Weight change: Filed Weights   04/05/20 0500 04/07/20 0615 04/08/20 0600  Weight: 104.5 kg 99.9 kg 100 kg    Intake/Output:   Intake/Output Summary (Last 24 hours) at 04/08/2020 1696 Last data filed at 04/08/2020 0500 Gross per 24 hour  Intake 2627.06 ml  Output 2913 ml  Net -285.94 ml        Physical Exam: CVP 6-7  GENERAL: In bed. NAD HEENT: Cortrak in place.  NECK: Supple, JVP 7-8   .  2+ bilaterally, no bruits.  No lymphadenopathy or thyromegaly appreciated.   CARDIAC:  Mechanical heart sounds  with LVAD hum present.  LUNGS:  Clear to auscultation bilaterally.  ABDOMEN:  Soft, round, nontender, positive bowel sounds x4.     LVAD exit site: well-healed and incorporated.  Dressing dry and intact.  No erythema or drainage.  Stabilization device present and accurately applied.  Driveline dressing is being changed daily per sterile technique. EXTREMITIES:  Warm and dry, no cyanosis, clubbing, rash or edema . R radial A line NEUROLOGIC:  Alert and oriented x3.   No aphasia.  No dysarthria.  Affect pleasant.       Telemetry: A fib 80-90s Personally reviewed    Labs: Basic Metabolic Panel: Recent Labs  Lab 04/04/20 0332 04/04/20 0337 04/05/20 0323 04/05/20 1548 04/06/20 0256 04/06/20 0312 04/06/20 1556 04/06/20 1556 04/06/20 2004 04/07/20 0311 04/07/20 0802 04/07/20 1536 04/08/20 0350  NA 139   < > 138   < > 137   < > 138   < > 135 136 137 137 137  K 4.9   < > 5.2*   < > 4.7   < > 4.6   < > 4.5 4.5 4.3 4.6 4.0  CL 103   < > 105   < > 104  --  103  --   --  101  --  101 101  CO2 25   < > 26   < > 25  --  25  --   --  24  --  24 25  GLUCOSE 188*   < > 156*   < > 200*  --  93  --   --  131*  --  195* 132*  BUN 35*   < > 30*   < > 32*  --  31*  --   --  34*  --  32* 32*  CREATININE 2.25*   < > 1.78*   < > 1.80*  --  1.86*  --   --  2.16*  --  1.96* 1.88*  CALCIUM 8.2*   < > 8.4*   < > 8.1*   < > 8.4*   < >  --  8.1*  --  8.5* 8.4*  MG 2.7*  --  2.6*  --  2.6*  --   --   --   --  2.5*  --   --  2.5*  PHOS 3.2   < > 2.7   < > 2.2*  --  3.6  --   --  3.2  --  3.3 2.6   < > = values in this interval not displayed.    Liver Function Tests: Recent Labs  Lab 04/02/20 0429 04/02/20 1620 04/03/20 0329 04/03/20 1607 04/04/20 0332 04/04/20 1610 04/05/20 0323 04/05/20 1548 04/06/20 0256 04/06/20 1556 04/07/20 0311 04/07/20 1536 04/08/20 0350  AST 35  --  37  --  35  --  33  --  34  --   --   --   --   ALT 20  --  22  --  22  --  23  --  21  --   --   --   --   ALKPHOS  78  --  119  --  112  --  142*  --  129*  --   --   --   --   BILITOT 1.4*  --  0.8  --  0.9  --  0.6  --  0.8  --   --   --   --   PROT 5.2*  --  5.2*  --  5.6*  --  5.9*  --  5.7*  --   --   --   --   ALBUMIN 2.6*  2.6*   < > 2.3*  2.2*   < > 2.2*  2.1*   < > 2.1*  2.2*   < > 2.1*  2.2* 2.2* 2.0* 2.1* 2.0*   < > = values in this interval not displayed.   No results for input(s): LIPASE, AMYLASE in the last 168 hours. No results for input(s): AMMONIA in the last 168 hours.  CBC: Recent Labs  Lab 04/02/20 0429 04/03/20 0318 04/05/20 0323 04/05/20 0323 04/05/20 1804 04/05/20 1804 04/06/20 0256 04/06/20 0256 04/06/20 0312 04/06/20 2004 04/07/20 0311 04/07/20 0802 04/08/20 0350  WBC 26.9*   < > 10.7*  --  7.8  --  8.8  --   --   --  14.3*  --  11.9*  NEUTROABS 24.0*  --   --   --   --   --   --   --   --   --   --   --   --   HGB 8.8*   < > 7.6*   < > 8.2*   < > 8.4*   < > 8.5* 9.5* 8.8* 9.5* 8.9*  HCT 27.1*   < >  25.1*   < > 27.0*   < > 27.5*   < > 25.0* 28.0* 28.4* 28.0* 28.3*  MCV 87.7   < > 95.1  --  94.1  --  92.3  --   --   --  91.3  --  92.5  PLT 129*   < > 124*  --  120*  --  131*  --   --   --  151  --  152   < > = values in this interval not displayed.    Cardiac Enzymes: No results for input(s): CKTOTAL, CKMB, CKMBINDEX, TROPONINI in the last 168 hours.  BNP: BNP (last 3 results) Recent Labs    03/21/20 0209 03/26/20 2329 04/02/20 2342  BNP 315.9* 425.7* 332.7*    ProBNP (last 3 results) No results for input(s): PROBNP in the last 8760 hours.    Other results:  Imaging: DG Chest 1 View  Result Date: 04/07/2020 CLINICAL DATA:  Post open heart surgery EXAM: CHEST  1 VIEW COMPARISON:  04/06/2020 FINDINGS: LVAD and left AICD remain in place, unchanged. Interval removal of endotracheal tube. Right dialysis catheter is unchanged. Cardiomegaly. Worsening aeration in the left lung may reflect a combination of left effusion and atelectasis/consolidation.  Minimal right base atelectasis. Mild vascular congestion. IMPRESSION: Support devices as above, in expected position. Cardiomegaly, vascular congestion. Increasing left lung airspace disease/effusion. Electronically Signed   By: Rolm Baptise M.D.   On: 04/07/2020 08:56   DG Abd 1 View  Result Date: 04/07/2020 CLINICAL DATA:  Feeding tube placement. EXAM: ABDOMEN - 1 VIEW COMPARISON:  04/06/2020 FINDINGS: Left ventricular assist device is again noted. The feeding tube tip is in the distal descending duodenum near its junction with the third portion the duodenum. The bowel gas pattern is unremarkable. IMPRESSION: Feeding tube tip is in the distal descending duodenum. Electronically Signed   By: Marijo Sanes M.D.   On: 04/07/2020 12:38   DG Abd 1 View  Result Date: 04/06/2020 CLINICAL DATA:  Enteric catheter placement EXAM: ABDOMEN - 1 VIEW COMPARISON:  04/06/2020, 04/02/2020 FINDINGS: Single frontal view of the lower chest and upper abdomen demonstrates an enteric catheter tip projecting over the gastric antrum/duodenal bulb. Right internal jugular and subclavian catheter is as well as the dual lead pacer and left ventricular assist device unchanged. Cardiac silhouette is enlarged but stable. Persistent left basilar consolidation and likely effusion. IMPRESSION: 1. Enteric catheter overlying gastric antrum. 2. Persistent left basilar consolidation and effusion. 3. Other support devices as above. Electronically Signed   By: Randa Ngo M.D.   On: 04/06/2020 16:02     Medications:     Scheduled Medications: . sodium chloride   Intravenous Once  . aspirin  81 mg Per Tube Daily  . atorvastatin  80 mg Per Tube q1800  . B-complex with vitamin C  1 tablet Per Tube Daily  . bisacodyl  10 mg Oral Daily   Or  . bisacodyl  10 mg Rectal Daily  . Chlorhexidine Gluconate Cloth  6 each Topical Daily  . darbepoetin (ARANESP) injection - NON-DIALYSIS  40 mcg Subcutaneous Once  . docusate  200 mg Per Tube  Daily  . feeding supplement (PRO-STAT SUGAR FREE 64)  30 mL Per Tube QID  . influenza vaccine adjuvanted  0.5 mL Intramuscular Tomorrow-1000  . insulin aspart  0-24 Units Subcutaneous Q4H  . insulin aspart  6 Units Subcutaneous Q4H  . insulin glargine  24 Units Subcutaneous BID  . linezolid  600 mg Per Tube Q12H  . mouth rinse  15 mL Mouth Rinse BID  . melatonin  3 mg Per Tube QHS  . metoCLOPramide (REGLAN) injection  5 mg Intravenous Q8H  . pantoprazole (PROTONIX) IV  40 mg Intravenous Daily  . polyethylene glycol  17 g Per Tube Daily  . QUEtiapine  25 mg Per Tube QHS  . sildenafil  20 mg Per Tube TID  . sodium chloride flush  10-40 mL Intracatheter Q12H  . warfarin  0.5 mg Oral ONCE-1600  . Warfarin - Pharmacist Dosing Inpatient   Does not apply q1600    Infusions: .  prismasol BGK 4/2.5 500 mL/hr at 04/08/20 0357  .  prismasol BGK 4/2.5 300 mL/hr at 04/08/20 8469  . sodium chloride Stopped (03/22/20 2011)  . sodium chloride 10 mL/hr at 04/07/20 2209  . sodium chloride    . sodium chloride 10 mL/hr at 04/05/20 1815  . sodium chloride    . amiodarone 30 mg/hr (04/08/20 0500)  . ceFEPime (MAXIPIME) IV Stopped (04/07/20 2159)  . dexmedetomidine (PRECEDEX) IV infusion Stopped (04/07/20 6295)  . epinephrine Stopped (04/05/20 0854)  . feeding supplement (VITAL 1.5 CAL) 1,000 mL (04/07/20 1245)  . lactated ringers Stopped (03/29/20 0748)  . milrinone 0.25 mcg/kg/min (04/08/20 0500)  . norepinephrine (LEVOPHED) Adult infusion 18 mcg/min (04/08/20 0500)  . prismasol BGK 4/2.5 1,500 mL/hr at 04/08/20 0356  . sodium phosphate  Dextrose 5% IVPB 10 mmol (04/08/20 0710)  . vasopressin (PITRESSIN) infusion - *FOR SHOCK* Stopped (04/07/20 0515)    PRN Medications: sodium chloride, acetaminophen, dextrose, fentaNYL (SUBLIMAZE) injection, heparin, heparin, hydrALAZINE, HYDROcodone-acetaminophen, levalbuterol, ondansetron (ZOFRAN) IV, ondansetron (ZOFRAN) IV, oxyCODONE, sodium chloride,  sodium chloride flush, sorbitol, traMADol   Assessment/Plan:   1. Acute on chronic systolic HF -> cardiogenic shock-> S/p HM3 LVAD - Echo 2016 EF 30-35% - Echo 1/21 EF 25% - Admitted with NYHA IV symptoms and AKI with attempts at diuresis.  - Echo this admit: EF 10-15% moderate RV dysfunction - R/LHC cath 3/21 with severe 3v CAD and low output with CI 1.7.  - Swan placed. Initial co-ox 39%. Initial PAPi 1.5. Started on milrinone 0.25. - Initially planned for CABG but given need for re-do sternotomy, relatively poor targets and longstanding low EF, VAD felt to be better option  - S/p HM3 VAD 4/8 - Speed turned up on 4/11 to  5400, but looking at 4/10 echo there is significant RV dysfunction with leftward septal shift so speed decreased back to 5300 rpm.  - Developed hemothorax on 4/20 requiring emergent chest tube and VATS - On NE 12 + Milrinone 0.25. CO-OX 77%.  - Volume status looks good on CVVHD. Remains anuric.   2. VAD - VAD interrogated personally. Parameters stable. - Had Pump Stop 04/03/20. Controller changed out with old controller sent back to [facility] for evaluation.  - on aspirin 81 mg daily + coumadin.  - LDH 273  - INR  2.8  Discussed dosing with PharmD personally. - Driveline ok   3. CAD with unstable angina - s/p previous PCI - cath 02/24/2020 with severe 3v CAD - now s/p VAD on 04/09/2020    4. AKI on CKD 3a - Nephrology consulted. Started CVVHD 4/13  - Renal US 4/5 unremarkable.  -  Hopefully renal function will recover but Renal team not optimistic  5. Permanent AF - now s/p MAZE + LAA Clipping 4/8   - Rate controlled - Continue IV amio while on high-dose pressors -  On coumadin. INR 2.8.   6. MVP s/p MV repair - On admit with recurrent severe MR on echo and with huge v-waves on PCWP tracing - s/p VAD  7. Hemothoax - s/p CT placement and VATs on 4/22 - CTs pulled 4/26 this am   8. ID/leukocytosis - 4/11 procalcitonin 1.96  - Started empiric coverage for  PNA with vancomycin/cefepime for 7 days. Stopped 4/17 - Given recent VATs and high-dose pressor requirement will cover with broad spectrum abx for now, linezolid and cefepime (completing course 4/28)  -WBC 8.8->14.3->11.1.   9. Acute Hypoxic Respiratory Failure - Re-intubated 4/20 with hemothorax- - Extubated 4/25 - Stable on Ivins today  10. Malnutrition  - Albumin 2.9   - On tube feeds.  - Speech following. Plan for FEEs today.    11. CVA/anoxic brain injury - 03/27/20 CT large posterior MCA ischemic infarct, possibly from atrial fibrillation.  INR was supratherapeutic when CVA found.  Stable LDH, do not think partial pump thrombosis is the culprit. - awake and moving all 4 extremities today.  - PT/OT/speech following. Recommending CIR.   12. Anemia, post-op   Received 2u RBC 04/05/20 - Hgb stable 8.9   13. Hyperkalemia -Resolved.  - Continue CVVHD  14. DM2, poorly controlled - hgbA1c 11.1% - insulin on hold due to hypoglycemia  Consult CIR.    Length of Stay: [place] NP-C  04/08/2020, 8:22 AM  Advanced Heart Failure Team Pager [phone] (M-F; 7a - 4p)  Please contact [facility] for night-coverage after hours (4p -7a ) and weekends on [email or web address]  Agree with above. Respiratory status stable off vent. Remains on NE and mirinone for BP and RV support. Co-ox ok.  Remains anuric. On CVVHD keeping even.   Worked with PT. Moving all extremities with mild L neglect.   General:  Lying in bed NAD.  HEENT: normal  Neck: supple. JVP not elevated.  RIJ TLC Carotids 2+ bilat; no bruits. No lymphadenopathy or thryomegaly appreciated. Cor: LVAD hum.  Lungs: Clear. Abdomen: obese soft, nontender, non-distended. No hepatosplenomegaly. No bruits or masses. Good bowel sounds. Driveline site clean. Anchor in place.  Extremities: no cyanosis, clubbing, rash. Warm no edema  Neuro: alert & oriented x 3. No focal deficits. Moves all 4 without problem  Mild L neglect  He is  progressing slowly. Still anuric on CVVHD and requiring dual pressors. Will wean milrinone to 0.125. Continue to mobilize. For FEEs today. VAD interrogated personally. Parameters stable. INR 2.8. Discussed dosing with PharmD personally.  CRITICAL CARE Performed by: Glori Bickers  Total critical care time: 35 minutes  Critical care time was exclusive of separately billable procedures and treating other patients.  Critical care was necessary to treat or prevent imminent or life-threatening deterioration.  Critical care was time spent personally by me (independent of midlevel providers or residents) on the following activities: development of treatment plan with patient and/or surrogate as well as nursing, discussions with consultants, evaluation of patient's response to treatment, examination of patient, obtaining history from patient or surrogate, ordering and performing treatments and interventions, ordering and review of laboratory studies, ordering and review of radiographic studies, pulse oximetry and re-evaluation of patient's condition.  Glori Bickers, MD  8:25 AM

## 2020-04-08 NOTE — Progress Notes (Addendum)
Patient ID: [name] [name], male   DOB: [birth year], 69 y.o.   MRN: [id number]    Advanced Heart Failure Rounding Note   Subjective:    - S/p HM3 VAD 4/8 w MAZE procedure + LAA clipping. - Extubated 4/9 - 4/12 LVAD speed increased to 5400 --> echo moderate-severe RV dysfunction, septum mildly left shifted, trivial pericardial effusion.  LVAD speed cut back to 5300.  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  - 4/20 developed hemothorax requiring emergent CT placement and eventual VATS - 4/21 EEG done given concerns for posturing and c/w moderate to severe diffuse encephalopathy. No seizures or epileptiform discharges were seen  - 04/03/20 Pump Stop --> controller change out. CT of head unchanged R MCA CVA - 04/06/20 Extubated  On NE 12 mcg milrinone 0.25 . CO-OX 77%  Remains anuric. On CVVHD keeping even. CVP 6-7   Denies Pain. Wants to sleep a little longer.   VAD Interrogation  Flow 4.2, Speed 5350, Power 4.0  Pulse Index 4.5  VAD interrogated personally. Parameters stable.  Objective:   Weight Range:  Vital Signs:   Temp:  [96.2 F (35.7 C)-97.8 F (36.6 C)] 97.6 F (36.4 C) (04/27 0400) Pulse Rate:  [81-118] 89 (04/27 0630) Resp:  [9-23] 12 (04/27 0630) BP: (77-125)/(65-87) 99/71 (04/27 0600) SpO2:  [90 %-99 %] 96 % (04/27 0630) Arterial Line BP: (66-125)/(50-81) 110/65 (04/27 0630) Weight:  [100 kg] 100 kg (04/27 0600) Last BM Date: 04/07/20  Weight change: Filed Weights   04/05/20 0500 04/07/20 0615 04/08/20 0600  Weight: 104.5 kg 99.9 kg 100 kg    Intake/Output:   Intake/Output Summary (Last 24 hours) at 04/08/2020 0732 Last data filed at 04/08/2020 0500 Gross per 24 hour  Intake 2692.6 ml  Output 3002 ml  Net -309.4 ml        Physical Exam: CVP 6-7  GENERAL: In bed. NAD HEENT: Cortrak in place.  NECK: Supple, JVP 7-8   .  2+ bilaterally, no bruits.  No lymphadenopathy or thyromegaly appreciated.   CARDIAC:  Mechanical heart sounds  with LVAD hum present.  LUNGS:  Clear to auscultation bilaterally.  ABDOMEN:  Soft, round, nontender, positive bowel sounds x4.     LVAD exit site: well-healed and incorporated.  Dressing dry and intact.  No erythema or drainage.  Stabilization device present and accurately applied.  Driveline dressing is being changed daily per sterile technique. EXTREMITIES:  Warm and dry, no cyanosis, clubbing, rash or edema . R radial A line NEUROLOGIC:  Alert and oriented x3.   No aphasia.  No dysarthria.  Affect pleasant.       Telemetry: A fib 80-90s Personally reviewed    Labs: Basic Metabolic Panel: Recent Labs  Lab 04/04/20 0332 04/04/20 0337 04/05/20 0323 04/05/20 1548 04/06/20 0256 04/06/20 0312 04/06/20 1556 04/06/20 1556 04/06/20 2004 04/07/20 0311 04/07/20 0802 04/07/20 1536 04/08/20 0350  NA 139   < > 138   < > 137   < > 138   < > 135 136 137 137 137  K 4.9   < > 5.2*   < > 4.7   < > 4.6   < > 4.5 4.5 4.3 4.6 4.0  CL 103   < > 105   < > 104  --  103  --   --  101  --  101 101  CO2 25   < > 26   < > 25  --  25  --   --  24  --  24 25  GLUCOSE 188*   < > 156*   < > 200*  --  93  --   --  131*  --  195* 132*  BUN 35*   < > 30*   < > 32*  --  31*  --   --  34*  --  32* 32*  CREATININE 2.25*   < > 1.78*   < > 1.80*  --  1.86*  --   --  2.16*  --  1.96* 1.88*  CALCIUM 8.2*   < > 8.4*   < > 8.1*   < > 8.4*   < >  --  8.1*  --  8.5* 8.4*  MG 2.7*  --  2.6*  --  2.6*  --   --   --   --  2.5*  --   --  2.5*  PHOS 3.2   < > 2.7   < > 2.2*  --  3.6  --   --  3.2  --  3.3 2.6   < > = values in this interval not displayed.    Liver Function Tests: Recent Labs  Lab 04/02/20 0429 04/02/20 1620 04/03/20 0329 04/03/20 1607 04/04/20 0332 04/04/20 1610 04/05/20 0323 04/05/20 1548 04/06/20 0256 04/06/20 1556 04/07/20 0311 04/07/20 1536 04/08/20 0350  AST 35  --  37  --  35  --  33  --  34  --   --   --   --   ALT 20  --  22  --  22  --  23  --  21  --   --   --   --   ALKPHOS  78  --  119  --  112  --  142*  --  129*  --   --   --   --   BILITOT 1.4*  --  0.8  --  0.9  --  0.6  --  0.8  --   --   --   --   PROT 5.2*  --  5.2*  --  5.6*  --  5.9*  --  5.7*  --   --   --   --   ALBUMIN 2.6*  2.6*   < > 2.3*  2.2*   < > 2.2*  2.1*   < > 2.1*  2.2*   < > 2.1*  2.2* 2.2* 2.0* 2.1* 2.0*   < > = values in this interval not displayed.   No results for input(s): LIPASE, AMYLASE in the last 168 hours. No results for input(s): AMMONIA in the last 168 hours.  CBC: Recent Labs  Lab 04/02/20 0429 04/03/20 0318 04/05/20 0323 04/05/20 0323 04/05/20 1804 04/05/20 1804 04/06/20 0256 04/06/20 0256 04/06/20 0312 04/06/20 2004 04/07/20 0311 04/07/20 0802 04/08/20 0350  WBC 26.9*   < > 10.7*  --  7.8  --  8.8  --   --   --  14.3*  --  11.9*  NEUTROABS 24.0*  --   --   --   --   --   --   --   --   --   --   --   --   HGB 8.8*   < > 7.6*   < > 8.2*   < > 8.4*   < > 8.5* 9.5* 8.8* 9.5* 8.9*  HCT 27.1*   < >  25.1*   < > 27.0*   < > 27.5*   < > 25.0* 28.0* 28.4* 28.0* 28.3*  MCV 87.7   < > 95.1  --  94.1  --  92.3  --   --   --  91.3  --  92.5  PLT 129*   < > 124*  --  120*  --  131*  --   --   --  151  --  152   < > = values in this interval not displayed.    Cardiac Enzymes: No results for input(s): CKTOTAL, CKMB, CKMBINDEX, TROPONINI in the last 168 hours.  BNP: BNP (last 3 results) Recent Labs    03/21/20 0209 03/26/20 2329 04/02/20 2342  BNP 315.9* 425.7* 332.7*    ProBNP (last 3 results) No results for input(s): PROBNP in the last 8760 hours.    Other results:  Imaging: DG Chest 1 View  Result Date: 04/07/2020 CLINICAL DATA:  Post open heart surgery EXAM: CHEST  1 VIEW COMPARISON:  04/06/2020 FINDINGS: LVAD and left AICD remain in place, unchanged. Interval removal of endotracheal tube. Right dialysis catheter is unchanged. Cardiomegaly. Worsening aeration in the left lung may reflect a combination of left effusion and atelectasis/consolidation.  Minimal right base atelectasis. Mild vascular congestion. IMPRESSION: Support devices as above, in expected position. Cardiomegaly, vascular congestion. Increasing left lung airspace disease/effusion. Electronically Signed   By: Rolm Baptise M.D.   On: 04/07/2020 08:56   DG Abd 1 View  Result Date: 04/07/2020 CLINICAL DATA:  Feeding tube placement. EXAM: ABDOMEN - 1 VIEW COMPARISON:  04/06/2020 FINDINGS: Left ventricular assist device is again noted. The feeding tube tip is in the distal descending duodenum near its junction with the third portion the duodenum. The bowel gas pattern is unremarkable. IMPRESSION: Feeding tube tip is in the distal descending duodenum. Electronically Signed   By: Marijo Sanes M.D.   On: 04/07/2020 12:38   DG Abd 1 View  Result Date: 04/06/2020 CLINICAL DATA:  Enteric catheter placement EXAM: ABDOMEN - 1 VIEW COMPARISON:  04/06/2020, 04/02/2020 FINDINGS: Single frontal view of the lower chest and upper abdomen demonstrates an enteric catheter tip projecting over the gastric antrum/duodenal bulb. Right internal jugular and subclavian catheter is as well as the dual lead pacer and left ventricular assist device unchanged. Cardiac silhouette is enlarged but stable. Persistent left basilar consolidation and likely effusion. IMPRESSION: 1. Enteric catheter overlying gastric antrum. 2. Persistent left basilar consolidation and effusion. 3. Other support devices as above. Electronically Signed   By: Randa Ngo M.D.   On: 04/06/2020 16:02     Medications:     Scheduled Medications: . sodium chloride   Intravenous Once  . aspirin  81 mg Per Tube Daily  . atorvastatin  80 mg Per Tube q1800  . B-complex with vitamin C  1 tablet Per Tube Daily  . bisacodyl  10 mg Oral Daily   Or  . bisacodyl  10 mg Rectal Daily  . Chlorhexidine Gluconate Cloth  6 each Topical Daily  . darbepoetin (ARANESP) injection - NON-DIALYSIS  40 mcg Subcutaneous Once  . docusate  200 mg Per Tube  Daily  . feeding supplement (PRO-STAT SUGAR FREE 64)  30 mL Per Tube QID  . influenza vaccine adjuvanted  0.5 mL Intramuscular Tomorrow-1000  . insulin aspart  0-24 Units Subcutaneous Q4H  . insulin aspart  6 Units Subcutaneous Q4H  . insulin glargine  24 Units Subcutaneous BID  . linezolid  600 mg Per Tube Q12H  . mouth rinse  15 mL Mouth Rinse BID  . melatonin  3 mg Per Tube QHS  . metoCLOPramide (REGLAN) injection  5 mg Intravenous Q8H  . pantoprazole (PROTONIX) IV  40 mg Intravenous Daily  . polyethylene glycol  17 g Per Tube Daily  . QUEtiapine  25 mg Per Tube QHS  . sildenafil  20 mg Per Tube TID  . sodium chloride flush  10-40 mL Intracatheter Q12H  . Warfarin - Pharmacist Dosing Inpatient   Does not apply q1600    Infusions: .  prismasol BGK 4/2.5 500 mL/hr at 04/08/20 0357  .  prismasol BGK 4/2.5 300 mL/hr at 04/08/20 5329  . sodium chloride Stopped (03/22/20 2011)  . sodium chloride 10 mL/hr at 04/07/20 2209  . sodium chloride    . sodium chloride 10 mL/hr at 04/05/20 1815  . sodium chloride    . amiodarone 30 mg/hr (04/08/20 0500)  . ceFEPime (MAXIPIME) IV Stopped (04/07/20 2159)  . dexmedetomidine (PRECEDEX) IV infusion Stopped (04/07/20 9242)  . epinephrine Stopped (04/05/20 0854)  . feeding supplement (VITAL 1.5 CAL) 1,000 mL (04/07/20 1245)  . lactated ringers Stopped (03/29/20 0748)  . milrinone 0.25 mcg/kg/min (04/08/20 0500)  . norepinephrine (LEVOPHED) Adult infusion 18 mcg/min (04/08/20 0500)  . prismasol BGK 4/2.5 1,500 mL/hr at 04/08/20 0356  . sodium phosphate  Dextrose 5% IVPB 10 mmol (04/08/20 0710)  . vasopressin (PITRESSIN) infusion - *FOR SHOCK* Stopped (04/07/20 0515)    PRN Medications: sodium chloride, acetaminophen, dextrose, fentaNYL (SUBLIMAZE) injection, heparin, heparin, hydrALAZINE, HYDROcodone-acetaminophen, levalbuterol, ondansetron (ZOFRAN) IV, ondansetron (ZOFRAN) IV, oxyCODONE, sodium chloride, sodium chloride flush, sorbitol,  traMADol   Assessment/Plan:   1. Acute on chronic systolic HF -> cardiogenic shock-> S/p HM3 LVAD - Echo 2016 EF 30-35% - Echo 1/21 EF 25% - Admitted with NYHA IV symptoms and AKI with attempts at diuresis.  - Echo this admit: EF 10-15% moderate RV dysfunction - R/LHC cath 3/21 with severe 3v CAD and low output with CI 1.7.  - Swan placed. Initial co-ox 39%. Initial PAPi 1.5. Started on milrinone 0.25. - Initially planned for CABG but given need for re-do sternotomy, relatively poor targets and longstanding low EF, VAD felt to be better option  - S/p HM3 VAD 4/8 - Speed turned up on 4/11 to  5400, but looking at 4/10 echo there is significant RV dysfunction with leftward septal shift so speed decreased back to 5300 rpm.  - Developed hemothorax on 4/20 requiring emergent chest tube and VATS - On NE 12 + Milrinone 0.25. CO-OX 77%.  - Volume status looks good on CVVHD. Remains anuric.   2. VAD - VAD interrogated personally. Parameters stable. - Had Pump Stop 04/03/20. Controller changed out with old controller sent back to [facility] for evaluation.  - on aspirin 81 mg daily + coumadin.  - LDH 273  - INR  2.8  Discussed dosing with PharmD personally. - Driveline ok   3. CAD with unstable angina - s/p previous PCI - cath 03/10/2020 with severe 3v CAD - now s/p VAD on 03/31/2020    4. AKI on CKD 3a - Nephrology consulted. Started CVVHD 4/13  - Renal US 4/5 unremarkable.  -  Hopefully renal function will recover but Renal team not optimistic  5. Permanent AF - now s/p MAZE + LAA Clipping 4/8   - Rate controlled - Continue IV amio while on high-dose pressors - On coumadin. INR 2.8.   6. MVP  s/p MV repair - On admit with recurrent severe MR on echo and with huge v-waves on PCWP tracing - s/p VAD  7. Hemothoax - s/p CT placement and VATs on 4/22 - CTs pulled 4/26 this am   8. ID/leukocytosis - 4/11 procalcitonin 1.96  - Started empiric coverage for PNA with vancomycin/cefepime for  7 days. Stopped 4/17 - Given recent VATs and high-dose pressor requirement will cover with broad spectrum abx for now, linezolid and cefepime (completing course 4/28)  -WBC 8.8->14.3->11.1.   9. Acute Hypoxic Respiratory Failure - Re-intubated 4/20 with hemothorax- - Extubated 4/25 - Stable on [place] today  10. Malnutrition  - Albumin 2.9   - On tube feeds.  - Speech following. Plan for FEEs today.    11. CVA/anoxic brain injury - 03/27/20 CT large posterior MCA ischemic infarct, possibly from atrial fibrillation.  INR was supratherapeutic when CVA found.  Stable LDH, do not think partial pump thrombosis is the culprit. - awake and moving all 4 extremities today.  - PT/OT/speech following. Recommending CIR.   12. Anemia, post-op   Received 2u RBC 04/05/20 - Hgb stable 8.9   13. Hyperkalemia -Resolved.  - Continue CVVHD  14. DM2, poorly controlled - hgbA1c 11.1% - insulin on hold due to hypoglycemia  Consult CIR.    Length of Stay: [place] NP-C  04/08/2020, 7:32 AM  Advanced Heart Failure Team Pager [phone] (M-F; 7a - 4p)  Please contact [facility] for night-coverage after hours (4p -7a ) and weekends on [email or web address]  Agree with above. See my note from later this am for further details.   Glori Bickers, MD  12:46 PM

## 2020-04-08 NOTE — Progress Notes (Signed)
7 Days Post-Op Procedure(s) (LRB): VIDEO ASSISTED THORACOSCOPY (Left) TRANSESOPHAGEAL ECHOCARDIOGRAM (TEE) (N/A) Evacuation Hematoma (Left) Subjective: Patient appropriately responsive and breathing comfortably Chest x-ray shows improved aeration Surgical incisions clean and dry with slight serous drainage from anterior chest tube site VAD parameters satisfactory Volume status probably low and saline bolus administered by renal today  Objective: Vital signs in last 24 hours: Temp:  [96.2 F (35.7 C)-98.4 F (36.9 C)] 98.4 F (36.9 C) (04/27 0818) Pulse Rate:  [81-183] 96 (04/27 0818) Cardiac Rhythm: Atrial fibrillation (04/27 0400) Resp:  [9-23] 14 (04/27 0818) BP: (77-125)/(65-87) 98/72 (04/27 0800) SpO2:  [90 %-99 %] 97 % (04/27 0818) Arterial Line BP: (66-122)/(50-81) 111/67 (04/27 0800) Weight:  [100 kg] 100 kg (04/27 0600)  Hemodynamic parameters for last 24 hours: CVP:  [0 mmHg-19 mmHg] 6 mmHg  Intake/Output from previous day: 04/26 0701 - 04/27 0700 In: 2692.6 [I.V.:1680.1; NG/GT:812.5; IV Piggyback:200] Out: 3002 [Stool:150] Intake/Output this shift: Total I/O In: 411.7 [I.V.:227; NG/GT:150; IV Piggyback:34.7] Out: 110 [Other:110]  Exam Engaged and responsive Lungs clear Chest incisions healing Abdomen soft Normal VAD hum Left side weakness stable  Lab Results: Recent Labs    04/07/20 0311 04/07/20 0311 04/07/20 0802 04/08/20 0350  WBC 14.3*  --   --  11.9*  HGB 8.8*   < > 9.5* 8.9*  HCT 28.4*   < > 28.0* 28.3*  PLT 151  --   --  152   < > = values in this interval not displayed.   BMET:  Recent Labs    04/07/20 1536 04/08/20 0350  NA 137 137  K 4.6 4.0  CL 101 101  CO2 24 25  GLUCOSE 195* 132*  BUN 32* 32*  CREATININE 1.96* 1.88*  CALCIUM 8.5* 8.4*    PT/INR:  Recent Labs    04/08/20 0350  LABPROT 29.8*  INR 2.8*   ABG    Component Value Date/Time   PHART 7.383 04/07/2020 0802   HCO3 27.3 04/07/2020 0802   TCO2 29 04/07/2020  0802   ACIDBASEDEF 2.0 03/25/2020 1405   O2SAT 77.4 04/08/2020 0339   CBG (last 3)  Recent Labs    04/08/20 0002 04/08/20 0345 04/08/20 0817  GLUCAP 171* 128* 87    Assessment/Plan: S/P Procedure(s) (LRB): VIDEO ASSISTED THORACOSCOPY (Left) TRANSESOPHAGEAL ECHOCARDIOGRAM (TEE) (N/A) Evacuation Hematoma (Left) Chronic atrial fibrillation with adequate anticoagulation with Coumadin Continue CVVH and norepinephrine dose decreasing with transition to HD when off norepinephrine Patient needs physical therapy for sitting and standing   LOS: 27 days    [name] [name] 04/08/2020

## 2020-04-08 NOTE — Progress Notes (Addendum)
LVAD Coordinator Rounding Note:  HM III LVAD implanted on 03/24/2020 with MAZE procedure + LAA clipping  by Dr Orvan Seen under destination therapy criteria due to advanced age. Primary Heart Failure Cardiologist is Dr. Haroldine Laws.   Pt lying in bed asleep. He just finished working with PT; stood at bedside.   Pt remains on CVVH; nurse reports keeping even.  Updated patient's daughter Ander Purpura re: patient status. Discussed introducing patient to VAD equipment starting tomorrow. Unsure of patient's ability to retain education. [name] aware of patient's potential education limitations.   Vital signs: Temp: 98.4 HR: 93 - afib A line: 124/76 (89) Cuff: 110/87 (95) O2 Sat: 97% on 3L Wt: 253.3>262.3>260.8>259.9>237.8>220>223>222.8>236.9>235>233.5>220.2>220.4 lbs   LVAD interrogation reveals:  Speed: 5300 Flow: 3.9 Power: 3.7w  PI: 5.2  Alarms: none  Events: none Hematocrit: 25  Fixed speed: 5300 Low speed limit: 5000  CRRT: keeping even  Drive Line: VAD dressing C/D/I; anchor intact and accurately applied. Drive line anchor intact. Will advance to twice weekly (Monday / Thursday) dressing changes per nurse champion or VAD coordinator.  Next dressing change due 04/10/20.      Labs:  LDH trend: [phone]  INR trend: 1.3>6.2>9>4.4>6.2>2.2>2.4>2.2>2.0>2.2>2.3>3.4>2.4>1.8>2.8  CR: 3.57>3.98>3.18>2.44>2.41>2.0>2.1>1.96>3.29>4.3>3.6>2.53>2.25>2.16>1.88  Anticoagulation Plan: -INR Goal: 2.0-2.5 -ASA Dose: 81 mg   Respiratory: - extubated 4/9/2 - re-intubated 04/02/2020 s/p left hemothorax with cardiac arrest  Nitric Oxide: off 03/23/20  Blood Products:  Intraop: 6 FFP  DDAVP  420 cell saver  03/19/2020>>4 FFP 03/21/20>> 1 RBC 03/22/20>>1 RBC  Post-op: 03/19/2020>>3 PC's, 2 FFP  Intra-op: 03/27/2020>>2 PCs      2 FFP 04/05/20: 2 PRBCs  Cultures:  -03/25/20- blood cultures>>negative - 04/10/2020-blood cultures>>negative  Device: -St  [name] -Therapies: VF 194 - therapy on  Drips: - Milrinone 0.125 mcg/kg/min - Epi 6 mcg/min >> off  - Vaso 0.03 units/min >> off  - Levo 12 mcg/min - Fentanyl 25 mcg/hr >> off  - Amiodarone 30 mg/hr - Tube feed: 50 mL/hr >> off  Adverse Events on VAD: >>03/25/20 CVVH Started >>03/30/2020 left hemothorax; cardiac arrest  Pt Education:  1. Education inappropriate at this time, pt is only able to follow simple commands at this time. Pt is worn out from working with PT.   2. No family at bedside.  Plan/Recommendations:  1. Call VAD coordinator for any equipment or drive line issues.  2. Dressing changes Monday / Thursday per nurse champion or VAD coordinator.   Emerson Monte RN [place] Coordinator  Office: [phone]  24/7 Pager: [phone]

## 2020-04-08 NOTE — Consult Note (Signed)
Physical Medicine and Rehabilitation Consult Reason for Consult: Decreased functional mobility Referring Physician: Dr. Orvan Seen   HPI: [name] [name] is a 69 y.o. right-handed male with history of type 2 diabetes mellitus, Covid infection June 2020, persistent atrial fibrillation with chronic anticoagulation, mitral valve prolapse with mitral valve repair 2004 at [facility], hypertension, hyperlipidemia, CAD with stenting.  Per chart review patient lives with children.  1 level home 3 steps to entry.  Ambulates with a cane.  Patient initially presented to [facility] 03/07/2020 with progressive weakness, dyspnea and chest pressure.  Troponin elevated 0.06.  He was transferred to [facility] for cardiac catheterization that showed severe triple-vessel CAD.  Echocardiogram with ejection fraction less than 20% global hypokinesis as well as severely depressed LV systolic function.  Hospital course complicated by CHF as well as AKI with creatinine 3.  9 8 and BUN of 7 1 and multiple tooth extractions for dental caries 03/13/2020.  Renal services consulted patient did require CRRT and await plan to transition to HD.  Patient underwent LVAD placement 03/29/2020 per Dr. Orvan Seen.  Patient did require short-term intubation.  On 03/27/2020 patient with decreased level of responsiveness as well as delirium.  Cranial CT scan showed large posterior right MCA infarction.  Neurology services consulted.  EEG negative for seizure.  Patient currently remains on Coumadin therapy.  He is n.p.o. with alternative means of nutritional support.  Therapy evaluations completed with recommendations of physical medicine rehab consult.   Review of Systems  Unable to perform ROS: Acuity of condition   Past Medical History:  Diagnosis Date  . Abdominal pain 01/05/2020  . Abnormal nuclear stress test 01/14/2015  . Acute on chronic combined systolic and diastolic CHF (congestive heart failure) ([place])   . Acute  respiratory failure with hypoxia ([place])   . AKI (acute kidney injury) ([place])   . Anxiety   . Arthritis   . Atrial fibrillation ([place]) 11/29/2016  . Atrial fibrillation with RVR ([place])   . Atrial fibrillation, chronic ([place]) 11/29/2016   Last Assessment & Plan:  Formatting of this note might be different from the original. -continue present medications with warfarin anti-coagulation Last Assessment & Plan:  Formatting of this note might be different from the original. -chronic -anti-coagulated -rate controlled  . Automatic implantable cardioverter-defibrillator in situ 08/18/2011   Formatting of this note might be different from the original. Last Assessment & Plan:  Now followed in ICD clinic. He has followup in January.  Last Assessment & Plan:  Formatting of this note might be different from the original. -historical note  . Benign prostatic hyperplasia   . Bipolar I disorder with depression ([place]) 01/06/2011   Last Assessment & Plan:  -historical note in chart -continue home medications  Last Assessment & Plan:  Formatting of this note might be different from the original. -historical note in chart -continue home medications  . CAD (coronary artery disease)    a.  1993 s/p MI - [employer];  b. s/p BMS to LAD '00;  c. PTCA 2nd diagonal 2010;  d. 02/18/12 Cath: moderate nonobs dzs - med rx;  e.  01/2015 Cath: LM nl, LAD 40-72mISR, 70-831m, d1 90p (3.0x16 Synergy DES), D2 50-60, LCX nl, OM1 50p, 8054m.5x12 Synergy DES), RCA nl, EF 30-35%.  . Cardiogenic shock ([place]/08/2020  . Chronic anticoagulation 09/22/2018   Last Assessment & Plan:  -continue warfarin for atrial fibrillation with pharmacy to manage  Last Assessment & Plan:  Formatting of this note might be different from the original. -continue warfarin for atrial fibrillation with pharmacy to manage  . Chronic combined systolic and diastolic CHF (congestive heart failure) ([place])    a. 12/2014 Echo: EF 30-35%, Gr2 DD, mod MR, sev dil LA.  Marland Kitchen  Chronic congestive heart failure ([place]) 04/22/2015   Last Assessment & Plan:  Formatting of this note might be different from the original. -known EF 20-25% -continue medical management -appears stable clinically  Last Assessment & Plan:  Formatting of this note might be different from the original. -noted with abnormal EF and chronic activity restrictions -medically manage  . Chronic systolic dysfunction of left ventricle 07/25/2013  . CKD (chronic kidney disease) 09/22/2018  . CKD (chronic kidney disease), stage III   . Coagulopathy ([place])   . COVID-19 04/17/2019   Last Assessment & Plan:  -Apparent scattered infiltrates on CT chest.  -SARS Co V positive -afebrile and not hypoxic at rest; does qualify for home o2 with sats 88% on room air with activity -home today on self-quarantine x 14 days -finish azithromycin at home -ask Home Health to check on patient Last Assessment & Plan:  -has persistently positive test since initial verification 04/16/19 -contract/d  . Depression   . Diabetic retinopathy associated with type 2 diabetes mellitus ([place]) 01/30/2020  . Diarrhea 01/05/2020  . Dizziness 09/27/2016  . DM (diabetes mellitus), type 2 ([place]) 03/19/2013   Last Assessment & Plan:  Formatting of this note might be different from the original. -hold metformin -reduce insulin with accuchecks and SSI -diet consult -HbA1c  . DNR (do not resuscitate) discussion 03/18/2013  . ED (erectile dysfunction)   . Elevated troponin 09/22/2018   Last Assessment & Plan:  Formatting of this note might be different from the original. -trend -mild elevation not thought to be ischemic on admission from history  . GERD (gastroesophageal reflux disease)   . Hyperlipidemia   . Hypertension   . ICD (implantable cardiac defibrillator) battery depletion 03/18/2013  . IHD (ischemic heart disease) 01/08/2019   Formatting of this note might be different from the original. Last Assessment & Plan:  Minimal symptoms of angina. He is on Ranexa,  isosorbide, and a beta blocker. We'll try to maximize his blood pressure therapy. I stressed the importance of regular daily aerobic exercise. He needs to follow a heart healthy/diabetic diet.  . Ischemic cardiomyopathy    a. s/p St. [name] (Atlas) ICD implanted in [place] 2007;  b. 12/2014 Echo: Ef 30-35%.  . Ischemic dilated cardiomyopathy ([place]) 04/04/2020  . LVAD (left ventricular assist device) present ([place]) 03/21/2020  . MVP (mitral valve prolapse)    a. s/p MV annuloplasty at [facility] 2004.  . Non-rheumatic mitral regurgitation 01/08/2019   Formatting of this note might be different from the original. Last Assessment & Plan:  He does have a murmur of mitral insufficiency by exam. We will followup an echocardiogram to assess the success of his mitral valve repair.  . Other chest pain 06/01/2019   Last Assessment & Plan:  Formatting of this note might be different from the original. -known COVID19 with known CAD and cardiomyopathy -trend troponins -consider other etiologies like cardiac involvement with COVID19 -consult DR Gavin Potters -consider echo (although none available until 6/22) -await CT thorax result  . Palliative care by specialist   . Persistent atrial fibrillation ([place])    a. noted on ICD interrogation '10 - not previously on [place] - CHA2DS2VASc = 5.  . Pneumonia, unspecified organism  09/22/2018  . S/P coronary artery stent placement   . Severe episode of recurrent major depressive disorder, without psychotic features ([place]) 02/07/2020  . Shortness of breath   . Syncope 09/15/2012  . Tick bite 06/02/2019   Last Assessment & Plan:  -reported by ER staff; had tick on umbilicus (removed) -monitor for any signs requiring treatment (I.e. rash, fever)  Last Assessment & Plan:  Formatting of this note might be different from the original. -reported by ER staff; had tick on umbilicus (removed) -monitor for any signs requiring treatment (I.e. rash, fever)  . TSH elevation   . Type II diabetes mellitus  (HCC)    uncontrolled  . Unstable angina ([place]) 02/19/2012   Past Surgical History:  Procedure Laterality Date  . CARDIAC DEFIBRILLATOR PLACEMENT  2007   implanted in [place], has a 7001 RV lead and a SJM Atlas ICD followed by Dr Caryl Comes  . CARDIOVERSION N/A 09/29/2016   Procedure: CARDIOVERSION;  Surgeon: Lelon Perla, MD;  Location: [facility] ENDOSCOPY;  Service: Cardiovascular;  Laterality: N/A;  . CARDIOVERSION N/A 12/03/2016   Procedure: CARDIOVERSION;  Surgeon: Dorothy Spark, MD;  Location: [place];  Service: Cardiovascular;  Laterality: N/A;  . CLIPPING OF ATRIAL APPENDAGE Left 03/24/2020   Procedure: Clipping Of Atrial Appendage;  Surgeon: Wonda Olds, MD;  Location: [place];  Service: Open Heart Surgery;  Laterality: Left;  . HEMATOMA EVACUATION Left 03/30/2020   Procedure: Evacuation Hematoma;  Surgeon: Prescott Gum, Collier Salina, MD;  Location: [place];  Service: Thoracic;  Laterality: Left;  . IMPLANTABLE CARDIOVERTER DEFIBRILLATOR (ICD) GENERATOR CHANGE N/A 03/19/2013   SJM Fortify ST DR generator placed by Dr Lovena Le, part of Analyze ST study  . INSERTION OF IMPLANTABLE LEFT VENTRICULAR ASSIST DEVICE N/A 04/08/2020   Procedure: INSERTION OF IMPLANTABLE LEFT VENTRICULAR ASSIST DEVICE - HM3;  Surgeon: Wonda Olds, MD;  Location: [place];  Service: Open Heart Surgery;  Laterality: N/A;  . LEFT AND RIGHT HEART CATHETERIZATION WITH CORONARY ANGIOGRAM N/A 01/14/2015   Procedure: LEFT AND RIGHT HEART CATHETERIZATION WITH CORONARY ANGIOGRAM;  Surgeon: Peter M Martinique, MD;  Location: [facility] CATH LAB;  Service: Cardiovascular;  Laterality: N/A;  . LEFT HEART CATHETERIZATION WITH CORONARY ANGIOGRAM N/A 02/17/2012   Procedure: LEFT HEART CATHETERIZATION WITH CORONARY ANGIOGRAM;  Surgeon: Jolaine Artist, MD;  Location: [facility] CATH LAB;  Service: Cardiovascular;  Laterality: N/A;  . MAZE N/A 03/19/2020   Procedure: MAZE;  Surgeon: Wonda Olds, MD;  Location: [place];  Service: Open Heart Surgery;   Laterality: N/A;  . MITRAL VALVE ANNULOPLASTY  2004   Archie Endo 02/17/2012  . MULTIPLE EXTRACTIONS WITH ALVEOLOPLASTY N/A 04/05/2020   Procedure: Extraction of tooth #'s 20,21,22 and 23 with alveoloplasaty;  Surgeon: Lenn Cal, DDS;  Location: [place];  Service: Oral Surgery;  Laterality: N/A;  . RIGHT/LEFT HEART CATH AND CORONARY ANGIOGRAPHY N/A 02/22/2020   Procedure: RIGHT/LEFT HEART CATH AND CORONARY ANGIOGRAPHY;  Surgeon: Burnell Blanks, MD;  Location: [place] CV LAB;  Service: Cardiovascular;  Laterality: N/A;  . TEE WITHOUT CARDIOVERSION N/A 09/29/2016   Procedure: TRANSESOPHAGEAL ECHOCARDIOGRAM (TEE);  Surgeon: Lelon Perla, MD;  Location: [facility] ENDOSCOPY;  Service: Cardiovascular;  Laterality: N/A;  . TEE WITHOUT CARDIOVERSION N/A 12/03/2016   Procedure: TRANSESOPHAGEAL ECHOCARDIOGRAM (TEE);  Surgeon: Dorothy Spark, MD;  Location: [place];  Service: Cardiovascular;  Laterality: N/A;  . TEE WITHOUT CARDIOVERSION N/A 03/31/2020   Procedure: TRANSESOPHAGEAL ECHOCARDIOGRAM (TEE);  Surgeon: Wonda Olds, MD;  Location:  [place] OR;  Service: Open Heart Surgery;  Laterality: N/A;  . TEE WITHOUT CARDIOVERSION N/A 03/31/2020   Procedure: TRANSESOPHAGEAL ECHOCARDIOGRAM (TEE);  Surgeon: Prescott Gum, Collier Salina, MD;  Location: [place];  Service: Thoracic;  Laterality: N/A;  . VIDEO ASSISTED THORACOSCOPY Left 04/03/2020   Procedure: VIDEO ASSISTED THORACOSCOPY;  Surgeon: Ivin Poot, MD;  Location: [facility] OR;  Service: Thoracic;  Laterality: Left;   Family History  Problem Relation Age of Onset  . Coronary artery disease Mother   . Colon cancer Mother   . Heart attack Mother   . Heart disease Mother   . Heart failure Mother   . Hypertension Mother   . Malignant hyperthermia Mother   . Colon cancer Sister   . Coronary artery disease Brother   . Heart disease Brother    Social History:  reports that he has never smoked. He has never used smokeless tobacco. He reports current  alcohol use. He reports that he does not use drugs. Allergies:  Allergies  Allergen Reactions  . Liraglutide Nausea And Vomiting  . Lisinopril Cough   Medications Prior to Admission  Medication Sig Dispense Refill  . ALPRAZolam (XANAX) 0.25 MG tablet Take 0.25 mg by mouth 3 (three) times daily as needed for anxiety.     Marland Kitchen amiodarone (PACERONE) 200 MG tablet Take 200 mg by mouth daily.     Marland Kitchen apixaban (ELIQUIS) 5 MG TABS tablet Take 1 tablet (5 mg total) by mouth 2 (two) times daily. 60 tablet 1  . atorvastatin (LIPITOR) 40 MG tablet Take 1 tablet (40 mg total) by mouth daily at 6 PM. 30 tablet 0  . citalopram (CELEXA) 20 MG tablet Take 20 mg by mouth daily.     . furosemide (LASIX) 40 MG tablet Take 40 mg by mouth daily.     . insulin aspart (NOVOLOG FLEXPEN) 100 UNIT/ML FlexPen Inject 6 Units into the skin 3 (three) times daily with meals. 15 mL 0  . Insulin Glargine (BASAGLAR KWIKPEN) 100 UNIT/ML SOPN Inject 0.5 mLs (50 Units total) into the skin daily. 15 mL 1  . metoprolol succinate (TOPROL-XL) 100 MG 24 hr tablet Take 1 tablet (100 mg total) by mouth daily. 90 tablet 3  . nitroGLYCERIN (NITROSTAT) 0.4 MG SL tablet Place 1 tablet (0.4 mg total) under the tongue every 5 (five) minutes x 3 doses as needed for chest pain. 10 tablet 0  . pantoprazole (PROTONIX) 40 MG tablet Take 1 tablet (40 mg total) by mouth daily. 30 tablet 1  . potassium chloride SA (KLOR-CON) 20 MEQ tablet Take 20 mEq by mouth daily.     . tamsulosin (FLOMAX) 0.4 MG CAPS capsule Take 1 capsule (0.4 mg total) by mouth daily. 30 capsule 0  . ALPRAZolam (XANAX) 1 MG tablet Take 1 tablet (1 mg total) by mouth 2 (two) times daily as needed for anxiety. (Patient not taking: Reported on 02/24/2020) 10 tablet 0    Home: Home Living Family/patient expects to be discharged to:: Private residence Living Arrangements: Children Available Help at Discharge: Family, Available PRN/intermittently Type of Home: House Home Access:  Stairs to enter Technical brewer of Steps: 3 Entrance Stairs-Rails: Can reach both Home Layout: One level Bathroom Shower/Tub: Chiropodist: Standard Home Equipment: Cane - single point, Grab bars - tub/shower Additional Comments: He reports he holds onto shower head for balance.  Functional History: Prior Function Level of Independence: Needs assistance Gait / Transfers Assistance Needed: pt ambulates with use of cane for  household and limited community distances ADL's / Homemaking Assistance Needed: pt reports sponge bathing due to falls risk in tub Comments: amb with cane Functional Status:  Mobility: Bed Mobility Overal bed mobility: Needs Assistance Bed Mobility: Supine to Sit, Sit to Supine Rolling: Mod assist, +2 for safety/equipment Supine to sit: +2 for physical assistance, Mod assist, HOB elevated(+3 for lines) Sit to supine: +2 for physical assistance, Max assist(+3 for lines) General bed mobility comments: supine<>sit utilizing bed egress function of bed. total +2 assist to slide to [facility] Transfers Overall transfer level: Needs assistance Equipment used: 2 person hand held assist Transfers: Sit to/from Stand Sit to Stand: Max assist, Mod assist, +2 physical assistance General transfer comment: did not attempt  Ambulation/Gait General Gait Details: unable    ADL: ADL Overall ADL's : Needs assistance/impaired Eating/Feeding: Bed level, Moderate assistance Eating/Feeding Details (indicate cue type and reason): assist to bring hand to mouth and locate straw for drinking water Grooming: Total assistance, Bed level Grooming Details (indicate cue type and reason): Pt able to assist with 10-20% of simple grooming tasks  Upper Body Bathing: Total assistance, Bed level Lower Body Bathing: Total assistance, Bed level Upper Body Dressing : Total assistance, Bed level Lower Body Dressing: Total assistance, Bed level Toilet Transfer: Total  assistance Toileting- Clothing Manipulation and Hygiene: Total assistance, Bed level Functional mobility during ADLs: Total assistance General ADL Comments: total A this session  Cognition: Cognition Overall Cognitive Status: Impaired/Different from baseline Arousal/Alertness: Awake/alert Orientation Level: Oriented to person, Oriented to place, Disoriented to time, Disoriented to situation Attention: Focused Focused Attention: Impaired Focused Attention Impairment: Verbal basic, Functional basic Awareness: Impaired Awareness Impairment: Emergent impairment Problem Solving: Impaired Problem Solving Impairment: Verbal basic Behaviors: Perseveration, Restless Safety/Judgment: Impaired Cognition Arousal/Alertness: Awake/alert Behavior During Therapy: Flat affect Overall Cognitive Status: Impaired/Different from baseline Area of Impairment: Orientation, Attention, Following commands, Safety/judgement, Awareness, Problem solving Orientation Level: Disoriented to, Time Current Attention Level: Sustained Memory: Decreased recall of precautions, Decreased short-term memory Following Commands: Follows one step commands inconsistently, Follows one step commands with increased time Safety/Judgement: Decreased awareness of safety, Decreased awareness of deficits Awareness: Intellectual Problem Solving: Slow processing, Decreased initiation, Difficulty sequencing General Comments: Pt requires increased time to initiate activity.  He appears to have ideomotor apraxia vs. motor impersistence.  He says he lives in [place], [place] Difficult to assess due to: Intubated  Blood pressure 99/71, pulse 89, temperature 97.6 F (36.4 C), temperature source Oral, resp. rate 12, height 6' (1.829 m), weight 100 kg, SpO2 96 %. Physical Exam  Nursing note and vitals reviewed. Constitutional: He appears well-developed and well-nourished. No distress.  Older gentleman sitting up on continuous dialysis, confused,  RN at bedside, NAD  HENT:  Head: Normocephalic and atraumatic.  Tongue midline Smile equal cortrack in place O2 by [place]  Eyes: Conjunctivae are normal. Right eye exhibits no discharge. Left eye exhibits no discharge.  EOMI grossly intact- couldn't completely follow finger- no nystagmus seen  Neck: No tracheal deviation present.  Cardiovascular:  LVAD hum heard-   Respiratory: No stridor.  Bed percussing, so hard ot hear, but overall , CTA b/l Sternal incision healing slowly.   GI:  Soft, NT, ND, (+)BS  Genitourinary:    Genitourinary Comments: Rectal tube in place; foley in place it appeared   Musculoskeletal:     Cervical back: Normal range of motion and neck supple.     Comments: Wiggled toes on R and fingers on R for me- after much work, able  to get him to wiggle fingers on L, but not toes Wearing B/L mittens- no other restraints   Neurological: He is alert.  Patient is alert and anxious.  Bilateral mittens in place.  Very limited verbal output.  He is pleasantly confused.  Follows simple commands. Pt very confused- when woke up, didn't know where he was- was "shocked" to see in hospital   Skin: Skin is warm and dry.  Since on HD, not able to check backside  Psychiatric:  Pleasantly confused    Results for orders placed or performed during the hospital encounter of 02/28/2020 (from the past 24 hour(s))  I-STAT 7, (LYTES, BLD GAS, ICA, H+H)     Status: Abnormal   Collection Time: 04/07/20  8:02 AM  Result Value Ref Range   pH, Arterial 7.383 7.350 - 7.450   pCO2 arterial 45.2 32.0 - 48.0 mmHg   pO2, Arterial 72 (L) 83.0 - 108.0 mmHg   Bicarbonate 27.3 20.0 - 28.0 mmol/L   TCO2 29 22 - 32 mmol/L   O2 Saturation 95.0 %   Acid-Base Excess 2.0 0.0 - 2.0 mmol/L   Sodium 137 135 - 145 mmol/L   Potassium 4.3 3.5 - 5.1 mmol/L   Calcium, Ion 1.18 1.15 - 1.40 mmol/L   HCT 28.0 (L) 39.0 - 52.0 %   Hemoglobin 9.5 (L) 13.0 - 17.0 g/dL   Patient temperature 96.4 F    Collection  site Magazine features editor by Operator    Sample type ARTERIAL   Glucose, capillary     Status: Abnormal   Collection Time: 04/07/20 11:50 AM  Result Value Ref Range   Glucose-Capillary 100 (H) 70 - 99 mg/dL  Renal function panel (daily at 1600)     Status: Abnormal   Collection Time: 04/07/20  3:36 PM  Result Value Ref Range   Sodium 137 135 - 145 mmol/L   Potassium 4.6 3.5 - 5.1 mmol/L   Chloride 101 98 - 111 mmol/L   CO2 24 22 - 32 mmol/L   Glucose, Bld 195 (H) 70 - 99 mg/dL   BUN 32 (H) 8 - 23 mg/dL   Creatinine, Ser 1.96 (H) 0.61 - 1.24 mg/dL   Calcium 8.5 (L) 8.9 - 10.3 mg/dL   Phosphorus 3.3 2.5 - 4.6 mg/dL   Albumin 2.1 (L) 3.5 - 5.0 g/dL   GFR calc non Af Amer 34 (L) >60 mL/min   GFR calc Af Amer 39 (L) >60 mL/min   Anion gap 12 5 - 15  Glucose, capillary     Status: Abnormal   Collection Time: 04/07/20  3:38 PM  Result Value Ref Range   Glucose-Capillary 175 (H) 70 - 99 mg/dL  Glucose, capillary     Status: Abnormal   Collection Time: 04/07/20  7:47 PM  Result Value Ref Range   Glucose-Capillary 128 (H) 70 - 99 mg/dL  Glucose, capillary     Status: Abnormal   Collection Time: 04/08/20 12:02 AM  Result Value Ref Range   Glucose-Capillary 171 (H) 70 - 99 mg/dL  .Cooxemetry Panel (carboxy, met, total hgb, O2 sat)     Status: Abnormal   Collection Time: 04/08/20  3:39 AM  Result Value Ref Range   Total hemoglobin 9.1 (L) 12.0 - 16.0 g/dL   O2 Saturation 77.4 %   Carboxyhemoglobin 2.0 (H) 0.5 - 1.5 %   Methemoglobin 1.0 0.0 - 1.5 %  Glucose, capillary     Status: Abnormal   Collection Time:  04/08/20  3:45 AM  Result Value Ref Range   Glucose-Capillary 128 (H) 70 - 99 mg/dL  Lactate dehydrogenase     Status: Abnormal   Collection Time: 04/08/20  3:50 AM  Result Value Ref Range   LDH 273 (H) 98 - 192 U/L  Protime-INR     Status: Abnormal   Collection Time: 04/08/20  3:50 AM  Result Value Ref Range   Prothrombin Time 29.8 (H) 11.4 - 15.2 seconds   INR 2.8 (H) 0.8 -  1.2  Renal function panel (daily at 0500)     Status: Abnormal   Collection Time: 04/08/20  3:50 AM  Result Value Ref Range   Sodium 137 135 - 145 mmol/L   Potassium 4.0 3.5 - 5.1 mmol/L   Chloride 101 98 - 111 mmol/L   CO2 25 22 - 32 mmol/L   Glucose, Bld 132 (H) 70 - 99 mg/dL   BUN 32 (H) 8 - 23 mg/dL   Creatinine, Ser 1.88 (H) 0.61 - 1.24 mg/dL   Calcium 8.4 (L) 8.9 - 10.3 mg/dL   Phosphorus 2.6 2.5 - 4.6 mg/dL   Albumin 2.0 (L) 3.5 - 5.0 g/dL   GFR calc non Af Amer 35 (L) >60 mL/min   GFR calc Af Amer 41 (L) >60 mL/min   Anion gap 11 5 - 15  Magnesium     Status: Abnormal   Collection Time: 04/08/20  3:50 AM  Result Value Ref Range   Magnesium 2.5 (H) 1.7 - 2.4 mg/dL  CBC     Status: Abnormal   Collection Time: 04/08/20  3:50 AM  Result Value Ref Range   WBC 11.9 (H) 4.0 - 10.5 K/uL   RBC 3.06 (L) 4.22 - 5.81 MIL/uL   Hemoglobin 8.9 (L) 13.0 - 17.0 g/dL   HCT 28.3 (L) 39.0 - 52.0 %   MCV 92.5 80.0 - 100.0 fL   MCH 29.1 26.0 - 34.0 pg   MCHC 31.4 30.0 - 36.0 g/dL   RDW 15.5 11.5 - 15.5 %   Platelets 152 150 - 400 K/uL   nRBC 0.3 (H) 0.0 - 0.2 %   DG Chest 1 View  Result Date: 04/07/2020 CLINICAL DATA:  Post open heart surgery EXAM: CHEST  1 VIEW COMPARISON:  04/06/2020 FINDINGS: LVAD and left AICD remain in place, unchanged. Interval removal of endotracheal tube. Right dialysis catheter is unchanged. Cardiomegaly. Worsening aeration in the left lung may reflect a combination of left effusion and atelectasis/consolidation. Minimal right base atelectasis. Mild vascular congestion. IMPRESSION: Support devices as above, in expected position. Cardiomegaly, vascular congestion. Increasing left lung airspace disease/effusion. Electronically Signed   By: Rolm Baptise M.D.   On: 04/07/2020 08:56   DG Abd 1 View  Result Date: 04/07/2020 CLINICAL DATA:  Feeding tube placement. EXAM: ABDOMEN - 1 VIEW COMPARISON:  04/06/2020 FINDINGS: Left ventricular assist device is again noted. The  feeding tube tip is in the distal descending duodenum near its junction with the third portion the duodenum. The bowel gas pattern is unremarkable. IMPRESSION: Feeding tube tip is in the distal descending duodenum. Electronically Signed   By: Marijo Sanes M.D.   On: 04/07/2020 12:38   DG Abd 1 View  Result Date: 04/06/2020 CLINICAL DATA:  Enteric catheter placement EXAM: ABDOMEN - 1 VIEW COMPARISON:  04/06/2020, 04/02/2020 FINDINGS: Single frontal view of the lower chest and upper abdomen demonstrates an enteric catheter tip projecting over the gastric antrum/duodenal bulb. Right internal jugular and subclavian catheter is  as well as the dual lead pacer and left ventricular assist device unchanged. Cardiac silhouette is enlarged but stable. Persistent left basilar consolidation and likely effusion. IMPRESSION: 1. Enteric catheter overlying gastric antrum. 2. Persistent left basilar consolidation and effusion. 3. Other support devices as above. Electronically Signed   By: Randa Ngo M.D.   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 Does the need for close, 24 hr/day medical supervision in concert with the patient's rehab needs make it unreasonable for this patient to be served in a less intensive setting? Yes 2. Co-Morbidities requiring supervision/potential complications: LVAD, DM, CRRT currently, infarct, chest tube s/p VATS procedure,  3. Due to bladder management, bowel management, safety, skin/wound care, disease management, medication administration, pain management and patient education, does the patient require 24 hr/day rehab nursing? Yes 4. Does the patient require coordinated care of a physician, rehab nurse, therapy disciplines of SLP, PT and OT to address physical and functional deficits in the context of the above medical diagnosis(es)? Potentially Addressing deficits in the following areas: balance, endurance,  locomotion, strength, transferring, bowel/bladder control, bathing, dressing, feeding, grooming, toileting, cognition, speech, swallowing and psychosocial support 5. Can the patient actively participate in an intensive therapy program of at least 3 hrs of therapy per day at least 5 days per week? Yes 6. The potential for patient to make measurable gains while on inpatient rehab is good and fair 7. Anticipated functional outcomes upon discharge from inpatient rehab are min assist  with PT, min assist with OT, supervision and min assist with SLP. 8. Estimated rehab length of stay to reach the above functional goals is: 3-4 weeks 9. Anticipated discharge destination: Home 10. Overall Rehab/Functional Prognosis: good and fair  RECOMMENDATIONS: This patient's condition is appropriate for continued rehabilitative care in the following setting: CIR Patient has agreed to participate in recommended program. Potentially Note that insurance prior authorization may be required for reimbursement for recommended care.  Comment:  1.  Pt is still in ICU, so it's hard to determine plan, but likely will need CIR once off continuous dialysis, rectal tube out, on road to learn LVAD.  2. Pt appears pleasantly confused today- suggest pictures of family to help with confusion. 3. Thank you for consult- will con't to monitor remotely- for CIR.      Lavon Paganini Angiulli, PA-C 04/08/2020   I have personally performed a face to face diagnostic evaluation of this patient and formulated the key components of the plan.  Additionally, I have personally reviewed laboratory data, imaging studies, as well as relevant notes and concur with the physician assistant's documentation above.

## 2020-04-08 NOTE — Progress Notes (Signed)
Occupational Therapy Re-evaluation (late entry)  Pt seen in conjunction with PT.  Pt awake with periods of lethargy especially as the session progressed.   He tolerated chair position in the bed x 18 mins before fatiguing,  while engaging in grooming tasks and therapeutic exercise of bil. UEs and LEs.   He demonstrates significant cognitive deficits including deficits with orientation, problem solving, attention, initiating.  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  He requires max - total A for all ADLs.  VSS throughout session with pt on 6L supplemental 02.  Pt on CRRT.   Goals were updated and continue to feel CIR may be an option depending on his progress with therapies.    04/07/20 1100  OT Visit Information  Last OT Received On 04/07/20  Assistance Needed +3 or more  PT/OT/SLP Co-Evaluation/Treatment Yes  Reason for Co-Treatment Complexity of the patient's impairments (multi-system involvement);Necessary to address cognition/behavior during functional activity;For patient/therapist safety;To address functional/ADL transfers  OT goals addressed during session ADL's and self-care;Strengthening/ROM  History of Present Illness 69 yo male with history of CAD with prior stenting of the LAD, Diagonal and OM in an outside hospital, ischemic cardiomyopathy with ICD in place, chronic systolic CHF, MVP s/p mitral valve repair, permanent atrial fib on chronic anticoagulation, HTN, HLD, DM, depression admitted to [facility] with progressive weakness, fatigue, dyspnea and chest pressure. Troponin elevated at 0.06. He was transferred to [facility] for cardiac cath, showing severe 3 vessel disease. Plan for CABG but pt with AKI. Pt underwent multiple tooth extraction on 4/6 and LVAD placement on 4/8. Postop day 7 patient was noted to be agitated the CT head was obtained showing a large posterior right MCA infarct. Pt with arrest on 4/20 and  reintubated, extubated 4/25. Pt on CRRT.  Precautions  Precautions Fall;Sternal  Precaution Comments LVAD, CRRT, cortrak, bil mittens  Restrictions  Weight Bearing Restrictions Yes  Other Position/Activity Restrictions sternal precautions  Home Living  Family/patient expects to be discharged to: Private residence  Living Arrangements Children  Available Help at Discharge Family;Available PRN/intermittently  Type of Home House  Home Access Stairs to enter  Entrance Stairs-Number of Steps 3  Entrance Stairs-Rails Can reach both  Home Layout One level  Bathroom Shower/Tub Tub/shower unit  [employer] officer, [employer] - single point;Grab bars - tub/shower  Additional Comments He reports he holds onto shower head for balance.  Prior Function  Level of Independence Needs assistance  Gait / Transfers Assistance Needed pt ambulates with use of cane for household and limited community distances  ADL's / Homemaking Assistance Needed pt reports sponge bathing due to falls risk in tub  Comments amb with cane  Communication  Communication HOH  Pain Assessment  Pain Assessment Faces  Faces Pain Scale 0  Cognition  Arousal/Alertness Awake/alert;Lethargic  Behavior During Therapy Flat affect  Overall Cognitive Status Impaired/Different from baseline  Area of Impairment Orientation;Attention;Following commands;Safety/judgement;Awareness;Problem solving  Orientation Level Disoriented to;Time  Current Attention Level Sustained (for up to 45-60 seconds )  Following Commands Follows one step commands inconsistently;Follows one step commands with increased time  Safety/Judgement Decreased awareness of safety;Decreased awareness of deficits  Problem Solving Slow processing;Decreased initiation;Difficulty sequencing  General Comments Pt requires increased time to initiate activity.  He appears to have ideomotor apraxia vs. motor impersistence.  He says he lives in [place], [place]   Upper Extremity Assessment  Upper Extremity Assessment Generalized weakness;RUE deficits/detail  RUE Deficits / Details pt demonstrates intention tremor which he indicates is not new, however, no family present.  He often "froze" and resisted movement when attempting to comb hair with hand over hand assistance.  Demonstrated difficulty orienting comb correctly in his hand  RUE Coordination decreased fine motor;decreased gross motor  LUE Deficits / Details generalized weakness.  due to difficulty using Rt UE to comb hair, attempted with Lt UE and hand over hand assit, but pt quickly moved comb back to his Rt hand   LUE Coordination decreased gross motor;decreased fine motor  Lower Extremity Assessment  Lower Extremity Assessment Defer to PT evaluation  ADL  Overall ADL's  Needs assistance/impaired  Eating/Feeding Bed level;Moderate assistance  Grooming Total assistance;Bed level  Grooming Details (indicate cue type and reason) Pt able to assist with 10-20% of simple grooming tasks   Upper Body Bathing Total assistance;Bed level  Lower Body Bathing Total assistance;Bed level  Upper Body Dressing  Total assistance;Bed level  Lower Body Dressing Total assistance;Bed level  Toilet Transfer Total assistance  Toileting- Clothing Manipulation and Hygiene Total assistance;Bed level  Functional mobility during ADLs Total assistance  Vision- Assessment  Additional Comments Pt will track both Lt and Rt, but initially appeared to have Lt inattention, but that improved as the session progressed with pt looking and attending to therapists on both the Lt and the Rt   Transfers  General transfer comment did not attempt   Balance  Overall balance assessment Needs assistance  Sitting-balance support Feet supported  Sitting balance-Leahy Scale Poor  Sitting balance - Comments In chair position in the bed, pt drifts frequently to the Lt requiring mod A initially to correct.  At end of session, he was able  to make adjustments to shift to Rt  x 2   Postural control Left lateral lean  Standing balance support Bilateral upper extremity supported  General Comments  General comments (skin integrity, edema, etc.) VSS remained stable with pt on 6L supplemental 02   Exercises  Exercises Other exercises  General Exercises - Upper Extremity  Shoulder Flexion AAROM;10 reps;Seated  Shoulder ABduction AAROM;5 reps;Seated  Other Exercises  Other Exercises Pt moved into chair position, he was able to tolerate upright position x ~26mins while participating in UE exercise, LE exercise and grooming activities.  Pt demonstrated difficulty maintaining arousal at the end of the session.  Pt   OT - End of Session  Equipment Utilized During Treatment Oxygen  Activity Tolerance Patient tolerated treatment well  Patient left in bed;with call bell/phone within reach;with nursing/sitter in room  Nurse Communication Mobility status  OT Assessment  OT Visit Diagnosis Other abnormalities of gait and mobility (R26.89);Cognitive communication deficit (R41.841)  Symptoms and signs involving cognitive functions Cerebral infarction  OT Plan  OT Frequency (ACUTE ONLY) Min 2X/week  AM-PAC OT "6 Clicks" Daily Activity Outcome Measure (Version 2)  Help from another person eating meals? 1  Help from another person taking care of personal grooming? 1  Help from another person toileting, which includes using toliet, bedpan, or urinal? 1  Help from another person bathing (including washing, rinsing, drying)? 1  Help from another person to put on and taking off regular upper body clothing? 1  Help from another person to put on and taking off regular lower body clothing? 1  6 Click Score 6  OT Recommendation  Follow Up Recommendations CIR;Supervision/Assistance - 24 hour  OT Equipment None recommended by OT  Acute Rehab OT Goals  Patient Stated  Goal Pt did not state   OT Goal Formulation With patient  Time For Goal Achievement  04/21/20  Potential to Achieve Goals Good  OT Time Calculation  OT Start Time (ACUTE ONLY) 1032  OT Stop Time (ACUTE ONLY) 1107  OT Time Calculation (min) 35 min  OT General Charges  $OT Visit 1 Visit  OT Evaluation  $OT Re-eval 1 Re-eval  Written Expression  Dominant Hand Right  Nilsa Nutting., OTR/L Acute Rehabilitation Services Pager [phone] Office [phone]

## 2020-04-08 NOTE — Progress Notes (Signed)
Occupational Therapy Treatment Patient Details Name: [name] [name] MRN: [id number] DOB: 02/17/1951 Today's Date: 04/08/2020    History of present illness 69 yo male with history of CAD with prior stenting of the LAD, Diagonal and OM in an outside hospital, ischemic cardiomyopathy with ICD in place, chronic systolic CHF, MVP s/p mitral valve repair, permanent atrial fib on chronic anticoagulation, HTN, HLD, DM, depression admitted to [facility] with progressive weakness, fatigue, dyspnea and chest pressure. Troponin elevated at 0.06. He was transferred to [facility] for cardiac cath, showing severe 3 vessel disease. Plan for CABG but pt with AKI. Pt underwent multiple tooth extraction on 4/6 and LVAD placement on 4/8. Postop day 7 patient was noted to be agitated the CT head was obtained showing a large posterior right MCA infarct. Pt with arrest on 4/20 and reintubated, extubated 4/25. Pt on CRRT.   OT comments  Pt seen in conjunction with PT.  Pt lethargic this date which limited participation and performance.  He initially demonstrated improved response times at beginning of session, but this worsened as he fatigued.  He continues with Lt inattention.  He was able to move sit to stand briefly with mod A +2 from egress position, but returned to sitting after ~5 seconds despite max encouragement.  Further attempts to stand were unsuccessful.   VSS with pt on 3L supplemental 02.  Anticipate he will need CIR level therapies unless activity tolerance does not allow. Will continue to follow.   Follow Up Recommendations  CIR;Supervision/Assistance - 24 hour    Equipment Recommendations  None recommended by OT    Recommendations for Other Services      Precautions / Restrictions Precautions Precautions: Fall;Sternal Precaution Comments: LVAD, CRRT, cortrak, bil mittens Restrictions Weight Bearing Restrictions: Yes       Mobility Bed Mobility               General bed mobility  comments: did not perform   Transfers Overall transfer level: Needs assistance Equipment used: 2 person hand held assist Transfers: Sit to/from Stand Sit to Stand: Mod assist;From elevated surface         General transfer comment: With bed in egress position, pt moved sit to stand with mod A +2 x 1.  He required verbal cues and assist for anterior translation of trunk and assist to boost hips into standing.  He quickly returned to standing.  Attempted to stand two more times, but pt with poor effort citing pain and fatigue     Balance Overall balance assessment: Needs assistance Sitting-balance support: Feet supported Sitting balance-Leahy Scale: Poor Sitting balance - Comments: Pt moved into chair/egress position in the bed.  He demonstrates Rt lateral  and posterior lean. He required mod A to maintain unsupported sitting balance  Postural control: Posterior lean;Left lateral lean Standing balance support: Bilateral upper extremity supported Standing balance-Leahy Scale: Poor Standing balance comment: Pt moved sit to stand x 1 with mod A +2, and maintained standing balance with mod  A +2 for only ~5 seconds before sitting.                            ADL either performed or assessed with clinical judgement   ADL Overall ADL's : Needs assistance/impaired Eating/Feeding: Bed level;Moderate assistance   Grooming: Wash/dry hands;Wash/dry face;Total assistance;Sitting  Functional mobility during ADLs: Moderate assistance;+2 for physical assistance(brief sit to stand only )       Vision   Additional Comments: Pt demonstrates Lt inattention    Perception     Praxis      Cognition Arousal/Alertness: Lethargic Behavior During Therapy: Flat affect Overall Cognitive Status: Impaired/Different from baseline Area of Impairment: Orientation;Attention;Memory;Following commands;Safety/judgement;Awareness;Problem solving                  Orientation Level: Disoriented to;Time Current Attention Level: Focused;Sustained Memory: Decreased short-term memory;Decreased recall of precautions Following Commands: Follows one step commands inconsistently;Follows one step commands with increased time Safety/Judgement: Decreased awareness of safety;Decreased awareness of deficits Awareness: Intellectual Problem Solving: Slow processing;Decreased initiation;Difficulty sequencing;Requires verbal cues General Comments: Pt lethargic today and required noxious stimuli to arouse him.  When he did arouse, he initiallly required less time to initiate responses and answer questions, however, he fatigued quickly during session.  Pt followed one step commands inconsistently, and was uncertain of the year, but able to recall that info ~7 -10 mins later          Exercises     Shoulder Instructions       General Comments 02 sats >93% with pt on 3L supplemental 02.  VSS stable throughout session.  He did complain with dizziness and nausea while seated - RN present     Pertinent Vitals/ Pain       Pain Assessment: Faces Faces Pain Scale: Hurts little more Pain Location: chest  Pain Descriptors / Indicators: Discomfort Pain Intervention(s): Monitored during session;Repositioned  Home Living                                          Prior Functioning/Environment              Frequency  Min 2X/week        Progress Toward Goals  OT Goals(current goals can now be found in the care plan section)        Plan Discharge plan remains appropriate    Co-evaluation    PT/OT/SLP Co-Evaluation/Treatment: Yes Reason for Co-Treatment: Complexity of the patient's impairments (multi-system involvement);For patient/therapist safety;To address functional/ADL transfers;Necessary to address cognition/behavior during functional activity   OT goals addressed during session: Strengthening/ROM;ADL's and self-care       AM-PAC OT "6 Clicks" Daily Activity     Outcome Measure   Help from another person eating meals?: Total Help from another person taking care of personal grooming?: Total Help from another person toileting, which includes using toliet, bedpan, or urinal?: Total Help from another person bathing (including washing, rinsing, drying)?: Total Help from another person to put on and taking off regular upper body clothing?: Total Help from another person to put on and taking off regular lower body clothing?: Total 6 Click Score: 6    End of Session Equipment Utilized During Treatment: Oxygen;Gait belt  OT Visit Diagnosis: Other abnormalities of gait and mobility (R26.89);Cognitive communication deficit (R41.841) Symptoms and signs involving cognitive functions: Cerebral infarction Pain - part of body: (chest )   Activity Tolerance Patient limited by pain;Patient limited by fatigue   Patient Left in bed;with call bell/phone within reach;with nursing/sitter in room   Nurse Communication Mobility status        Time: 1000-1035 OT Time Calculation (min): 35 min  Charges: OT General Charges $OT Visit: 1 Visit OT Treatments $  Neuromuscular Re-education: 8-22 mins  Nilsa Nutting., OTR/L Acute Rehabilitation Services Pager [phone] Office [place], [place] 04/08/2020, 1:14 PM

## 2020-04-08 NOTE — Progress Notes (Addendum)
Physical Therapy Treatment Patient Details Name: [name] [name] MRN: [id number] DOB: 27-Feb-1951 Today's Date: 04/08/2020    History of Present Illness 69 yo male with history of CAD with prior stenting of the LAD, Diagonal and OM in an outside hospital, ischemic cardiomyopathy with ICD in place, chronic systolic CHF, MVP s/p mitral valve repair, permanent atrial fib on chronic anticoagulation, HTN, HLD, DM, depression admitted to [facility] with progressive weakness, fatigue, dyspnea and chest pressure. Troponin elevated at 0.06. He was transferred to [facility] for cardiac cath, showing severe 3 vessel disease. Plan for CABG but pt with AKI. Pt underwent multiple tooth extraction on 4/6 and LVAD placement on 4/8. Postop day 7 patient was noted to be agitated the CT head was obtained showing a large posterior right MCA infarct. Pt with arrest on 4/20 and reintubated, extubated 4/25. Pt on CRRT.    PT Comments    Pt seen in conjunction with OT. Pt initially lethargic but arousable and able to converse initially. Pt in chair position grossly 12 min with pt only able to tolerate one standing trial limited by lethargy and pain. When cued for using RUE to hook P.T. arm in standing pt pushing away and utilized belt and pad for standing pt with pushing vs apraxia. Pt with improved responsiveness to conversation and command initially but does not demonstrate awareness of deficits. Pt with left posterior lean in sitting and supine with assist for midline and positioning  Question whether pt will perform better earlier in am as RN reports increased arousal will attempt to determine ideal time for therapy.  BP after standing 93/77 (85) Supine end of session 110/87 (95) 96-98% on 3L HR 90-94    Follow Up Recommendations  CIR;Supervision/Assistance - 24 hour;LTACH(pending medical stability)     Equipment Recommendations       Recommendations for Other Services       Precautions / Restrictions  Precautions Precautions: Fall;Sternal Precaution Comments: LVAD, CRRT, cortrak, bil mittens Restrictions Weight Bearing Restrictions: Yes    Mobility  Bed Mobility Overal bed mobility: Needs Assistance Bed Mobility: Supine to Sit;Sit to Supine           General bed mobility comments: supine to and from sit utilized with bed egress positioning of bed  Transfers Overall transfer level: Needs assistance Equipment used: 2 person hand held assist Transfers: Sit to/from Stand Sit to Stand: Mod assist;+2 physical assistance         General transfer comment: With bed in egress position, pt moved sit to stand with mod A +2 x 1.  He required verbal cues and assist for anterior translation of trunk and assist to boost hips into standing.  He quickly returned to sitting.  Attempted to stand two more times, but pt with poor effort citing pain and fatigue. max assist to scoot hips forward in sitting  Ambulation/Gait             General Gait Details: unable   Stairs             Wheelchair Mobility    Modified Rankin (Stroke Patients Only) Modified Rankin (Stroke Patients Only) Pre-Morbid Rankin Score: No symptoms Modified Rankin: Severe disability     Balance Overall balance assessment: Needs assistance Sitting-balance support: Feet supported Sitting balance-Leahy Scale: Poor Sitting balance - Comments: Pt moved into chair/egress position in the bed.  He demonstrates left lateral  and posterior lean. He required mod A to maintain unsupported sitting balance with max cues for anterior  right translation Postural control: Posterior lean;Left lateral lean Standing balance support: Bilateral upper extremity supported Standing balance-Leahy Scale: Poor Standing balance comment: Pt moved sit to stand x 1 with mod A +2, and maintained standing balance with mod  A +2 for only ~5 seconds before sitting.                             Cognition Arousal/Alertness:  Lethargic Behavior During Therapy: Flat affect Overall Cognitive Status: Impaired/Different from baseline Area of Impairment: Orientation;Attention;Memory;Following commands;Safety/judgement;Awareness;Problem solving                 Orientation Level: Disoriented to;Time Current Attention Level: Focused;Sustained Memory: Decreased short-term memory;Decreased recall of precautions Following Commands: Follows one step commands inconsistently;Follows one step commands with increased time Safety/Judgement: Decreased awareness of safety;Decreased awareness of deficits Awareness: Intellectual Problem Solving: Slow processing;Decreased initiation;Difficulty sequencing;Requires verbal cues General Comments: Pt lethargic today and required noxious stimuli to arouse him.  When he did arouse, he initiallly required less time to initiate responses and answer questions, however, he fatigued quickly during session.  Pt followed one step commands inconsistently, and was uncertain of the year, but able to recall that info ~7 -10 mins later        Exercises      General Comments General comments (skin integrity, edema, etc.): 02 sats >93% with pt on 3L supplemental 02.  VSS stable throughout session.  He did complain with dizziness and nausea while seated - RN present       Pertinent Vitals/Pain Pain Assessment: Faces Faces Pain Scale: Hurts little more Pain Location: chest  Pain Descriptors / Indicators: Discomfort Pain Intervention(s): Limited activity within patient's tolerance;Monitored during session;Repositioned    Home Living                      Prior Function            PT Goals (current goals can now be found in the care plan section) Acute Rehab PT Goals Time For Goal Achievement: 04/22/20 Potential to Achieve Goals: Fair Progress towards PT goals: Goals downgraded-see care plan(limited)    Frequency    Min 3X/week      PT Plan Current plan remains  appropriate    Co-evaluation PT/OT/SLP Co-Evaluation/Treatment: Yes Reason for Co-Treatment: Complexity of the patient's impairments (multi-system involvement);For patient/therapist safety PT goals addressed during session: Mobility/safety with mobility OT goals addressed during session: Strengthening/ROM;ADL's and self-care      AM-PAC PT "6 Clicks" Mobility   Outcome Measure  Help needed turning from your back to your side while in a flat bed without using bedrails?: Total Help needed moving from lying on your back to sitting on the side of a flat bed without using bedrails?: Total Help needed moving to and from a bed to a chair (including a wheelchair)?: Total Help needed standing up from a chair using your arms (e.g., wheelchair or bedside chair)?: Total Help needed to walk in hospital room?: Total Help needed climbing 3-5 steps with a railing? : Total 6 Click Score: 6    End of Session Equipment Utilized During Treatment: Gait belt;Oxygen Activity Tolerance: Patient limited by fatigue Patient left: in bed;with call bell/phone within reach;with nursing/sitter in room Nurse Communication: Mobility status;Precautions PT Visit Diagnosis: Muscle weakness (generalized) (M62.81);Other abnormalities of gait and mobility (R26.89);Other symptoms and signs involving the nervous system (L46.503)     Time: 5465-6812 PT Time Calculation (min) (  ACUTE ONLY): 29 min  Charges:  $Therapeutic Activity: 8-22 mins                     Honor Frison P, PT Acute Rehabilitation Services Pager: [phone] Office: [place] 04/08/2020, 1:45 PM

## 2020-04-09 DIAGNOSIS — R570 Cardiogenic shock: Secondary | ICD-10-CM | POA: Diagnosis not present

## 2020-04-09 LAB — RENAL FUNCTION PANEL
Albumin: 2.4 g/dL — ABNORMAL LOW (ref 3.5–5.0)
Albumin: 2.4 g/dL — ABNORMAL LOW (ref 3.5–5.0)
Anion gap: 12 (ref 5–15)
Anion gap: 9 (ref 5–15)
BUN: 32 mg/dL — ABNORMAL HIGH (ref 8–23)
BUN: 31 mg/dL — ABNORMAL HIGH (ref 8–23)
CO2: 21 mmol/L — ABNORMAL LOW (ref 22–32)
CO2: 25 mmol/L (ref 22–32)
Calcium: 8.3 mg/dL — ABNORMAL LOW (ref 8.9–10.3)
Calcium: 8.6 mg/dL — ABNORMAL LOW (ref 8.9–10.3)
Chloride: 105 mmol/L (ref 98–111)
Chloride: 104 mmol/L (ref 98–111)
Creatinine, Ser: 1.83 mg/dL — ABNORMAL HIGH (ref 0.61–1.24)
Creatinine, Ser: 1.84 mg/dL — ABNORMAL HIGH (ref 0.61–1.24)
GFR calc Af Amer: 42 mL/min — ABNORMAL LOW (ref >60)
GFR calc Af Amer: 42 mL/min — ABNORMAL LOW (ref >60)
GFR calc non Af Amer: 37 mL/min — ABNORMAL LOW (ref >60)
GFR calc non Af Amer: 36 mL/min — ABNORMAL LOW (ref >60)
Glucose, Bld: 117 mg/dL — ABNORMAL HIGH (ref 70–99)
Glucose, Bld: 123 mg/dL — ABNORMAL HIGH (ref 70–99)
Phosphorus: 2.3 mg/dL — ABNORMAL LOW (ref 2.5–4.6)
Phosphorus: 3.5 mg/dL (ref 2.5–4.6)
Potassium: 4.2 mmol/L (ref 3.5–5.1)
Potassium: 4.5 mmol/L (ref 3.5–5.1)
Sodium: 138 mmol/L (ref 135–145)
Sodium: 138 mmol/L (ref 135–145)

## 2020-04-09 LAB — GLUCOSE, CAPILLARY
Glucose-Capillary: 110 mg/dL — ABNORMAL HIGH (ref 70–99)
Glucose-Capillary: 123 mg/dL — ABNORMAL HIGH (ref 70–99)
Glucose-Capillary: 144 mg/dL — ABNORMAL HIGH (ref 70–99)
Glucose-Capillary: 188 mg/dL — ABNORMAL HIGH (ref 70–99)
Glucose-Capillary: 96 mg/dL (ref 70–99)
Glucose-Capillary: 97 mg/dL (ref 70–99)

## 2020-04-09 LAB — COOXEMETRY PANEL
Carboxyhemoglobin: 2.2 % — ABNORMAL HIGH (ref 0.5–1.5)
Methemoglobin: 1.1 % (ref 0.0–1.5)
O2 Saturation: 77.1 %
Total hemoglobin: 8.4 g/dL — ABNORMAL LOW (ref 12.0–16.0)

## 2020-04-09 LAB — CBC
HCT: 25.7 % — ABNORMAL LOW (ref 39.0–52.0)
Hemoglobin: 8.1 g/dL — ABNORMAL LOW (ref 13.0–17.0)
MCH: 28.6 pg (ref 26.0–34.0)
MCHC: 31.5 g/dL (ref 30.0–36.0)
MCV: 90.8 fL (ref 80.0–100.0)
Platelets: 125 10*3/uL — ABNORMAL LOW (ref 150–400)
RBC: 2.83 MIL/uL — ABNORMAL LOW (ref 4.22–5.81)
RDW: 15.7 % — ABNORMAL HIGH (ref 11.5–15.5)
WBC: 8 10*3/uL (ref 4.0–10.5)
nRBC: 0.4 % — ABNORMAL HIGH (ref 0.0–0.2)

## 2020-04-09 LAB — LACTATE DEHYDROGENASE: LDH: 259 U/L — ABNORMAL HIGH (ref 98–192)

## 2020-04-09 LAB — PREALBUMIN: Prealbumin: 12.6 mg/dL — ABNORMAL LOW (ref 18–38)

## 2020-04-09 LAB — PROTIME-INR
INR: 3.1 — ABNORMAL HIGH (ref 0.8–1.2)
Prothrombin Time: 30.9 seconds — ABNORMAL HIGH (ref 11.4–15.2)

## 2020-04-09 LAB — MAGNESIUM: Magnesium: 2.6 mg/dL — ABNORMAL HIGH (ref 1.7–2.4)

## 2020-04-09 MED ORDER — MIDODRINE HCL 5 MG PO TABS
5.0000 mg | ORAL_TABLET | Freq: Three times a day (TID) | ORAL | Status: DC
  Administered 2020-04-09: 13:00:00 5 mg via ORAL
  Filled 2020-04-09: qty 1

## 2020-04-09 MED ORDER — SODIUM PHOSPHATES 45 MMOLE/15ML IV SOLN
30.0000 mmol | Freq: Once | INTRAVENOUS | Status: AC
  Administered 2020-04-09: 08:00:00 30 mmol via INTRAVENOUS
  Filled 2020-04-09: qty 10

## 2020-04-09 MED ORDER — MIDODRINE HCL 5 MG PO TABS
5.0000 mg | ORAL_TABLET | Freq: Three times a day (TID) | ORAL | Status: DC
  Administered 2020-04-09 – 2020-04-10 (×2): 5 mg
  Filled 2020-04-09 (×2): qty 1

## 2020-04-09 MED FILL — Sodium Chloride IV Soln 0.9%: INTRAVENOUS | Qty: 4000 | Status: AC

## 2020-04-09 MED FILL — Heparin Sodium (Porcine) Inj 1000 Unit/ML: INTRAMUSCULAR | Qty: 30 | Status: AC

## 2020-04-09 NOTE — Progress Notes (Signed)
ANTICOAGULATION CONSULT NOTE  Pharmacy Consult for warfarin Indication: post op LVAD  / Aflutter  Allergies  Allergen Reactions  . Liraglutide Nausea And Vomiting  . Lisinopril Cough    Patient Measurements: Height: 6' (182.9 cm) Weight: 99.6 kg (219 lb 9.3 oz) IBW/kg (Calculated) : 77.6 Heparin Dosing Weight: 101.2 kg  Vital Signs: Temp: 97.5 F (36.4 C) (04/28 0700) Temp Source: Axillary (04/28 0700) BP: 129/119 (04/28 0600) Pulse Rate: 87 (04/28 0700)  Labs: Recent Labs    04/07/20 0311 04/07/20 0311 04/07/20 0802 04/07/20 1536 04/08/20 0350 04/08/20 1632 04/09/20 0438  HGB 8.8*   < > 9.5*  --  8.9*  --  8.1*  HCT 28.4*   < > 28.0*  --  28.3*  --  25.7*  PLT 151  --   --   --  152  --  125*  LABPROT 20.7*  --   --   --  29.8*  --  30.9*  INR 1.8*  --   --   --  2.8*  --  3.1*  CREATININE 2.16*  --   --    < > 1.88* 1.91* 1.83*   < > = values in this interval not displayed.    Estimated Creatinine Clearance: 45.9 mL/min (A) (by C-G formula based on SCr of 1.83 mg/dL (H)).   Medical History: Past Medical History:  Diagnosis Date  . Abdominal pain 01/05/2020  . Abnormal nuclear stress test 01/14/2015  . Acute on chronic combined systolic and diastolic CHF (congestive heart failure) ([place])   . Acute respiratory failure with hypoxia ([place])   . AKI (acute kidney injury) ([place])   . Anxiety   . Arthritis   . Atrial fibrillation ([place]) 11/29/2016  . Atrial fibrillation with RVR ([place])   . Atrial fibrillation, chronic ([place]) 11/29/2016   Last Assessment & Plan:  Formatting of this note might be different from the original. -continue present medications with warfarin anti-coagulation Last Assessment & Plan:  Formatting of this note might be different from the original. -chronic -anti-coagulated -rate controlled  . Automatic implantable cardioverter-defibrillator in situ 08/18/2011   Formatting of this note might be different from the original. Last Assessment & Plan:  Now  followed in ICD clinic. He has followup in January.  Last Assessment & Plan:  Formatting of this note might be different from the original. -historical note  . Benign prostatic hyperplasia   . Bipolar I disorder with depression ([place]) 01/06/2011   Last Assessment & Plan:  -historical note in chart -continue home medications  Last Assessment & Plan:  Formatting of this note might be different from the original. -historical note in chart -continue home medications  . CAD (coronary artery disease)    a.  1993 s/p MI - [employer];  b. s/p BMS to LAD '00;  c. PTCA 2nd diagonal 2010;  d. 02/18/12 Cath: moderate nonobs dzs - med rx;  e.  01/2015 Cath: LM nl, LAD 40-44m ISR, 70-42m/d, d1 90p (3.0x16 Synergy DES), D2 50-60, LCX nl, OM1 50p, 33m (2.5x12 Synergy DES), RCA nl, EF 30-35%.  . Cardiogenic shock ([place]) 03/21/2020  . Chronic anticoagulation 09/22/2018   Last Assessment & Plan:  -continue warfarin for atrial fibrillation with pharmacy to manage  Last Assessment & Plan:  Formatting of this note might be different from the original. -continue warfarin for atrial fibrillation with pharmacy to manage  . Chronic combined systolic and diastolic CHF (congestive heart failure) ([place])    a. 12/2014 Echo:  EF 30-35%, Gr2 DD, mod MR, sev dil LA.  Marland Kitchen Chronic congestive heart failure ([place]) 04/22/2015   Last Assessment & Plan:  Formatting of this note might be different from the original. -known EF 20-25% -continue medical management -appears stable clinically  Last Assessment & Plan:  Formatting of this note might be different from the original. -noted with abnormal EF and chronic activity restrictions -medically manage  . Chronic systolic dysfunction of left ventricle 07/25/2013  . CKD (chronic kidney disease) 09/22/2018  . CKD (chronic kidney disease), stage III   . Coagulopathy ([place])   . COVID-19 04/17/2019   Last Assessment & Plan:  -Apparent scattered infiltrates on CT chest.  -SARS Co V positive -afebrile and not  hypoxic at rest; does qualify for home o2 with sats 88% on room air with activity -home today on self-quarantine x 14 days -finish azithromycin at home -ask Home Health to check on patient Last Assessment & Plan:  -has persistently positive test since initial verification 04/16/19 -contract/d  . Depression   . Diabetic retinopathy associated with type 2 diabetes mellitus ([place]) 01/30/2020  . Diarrhea 01/05/2020  . Dizziness 09/27/2016  . DM (diabetes mellitus), type 2 ([place]) 03/19/2013   Last Assessment & Plan:  Formatting of this note might be different from the original. -hold metformin -reduce insulin with accuchecks and SSI -diet consult -HbA1c  . DNR (do not resuscitate) discussion 03/18/2013  . ED (erectile dysfunction)   . Elevated troponin 09/22/2018   Last Assessment & Plan:  Formatting of this note might be different from the original. -trend -mild elevation not thought to be ischemic on admission from history  . GERD (gastroesophageal reflux disease)   . Hyperlipidemia   . Hypertension   . ICD (implantable cardiac defibrillator) battery depletion 03/18/2013  . IHD (ischemic heart disease) 01/08/2019   Formatting of this note might be different from the original. Last Assessment & Plan:  Minimal symptoms of angina. He is on Ranexa, isosorbide, and a beta blocker. We'll try to maximize his blood pressure therapy. I stressed the importance of regular daily aerobic exercise. He needs to follow a heart healthy/diabetic diet.  . Ischemic cardiomyopathy    a. s/p [name] (Atlas) ICD implanted in [place] 2007;  b. 12/2014 Echo: Ef 30-35%.  . Ischemic dilated cardiomyopathy ([place]) 04/03/2020  . LVAD (left ventricular assist device) present ([place]) 03/21/2020  . MVP (mitral valve prolapse)    a. s/p MV annuloplasty at [facility] 2004.  . Non-rheumatic mitral regurgitation 01/08/2019   Formatting of this note might be different from the original. Last Assessment & Plan:  He does have a murmur of mitral  insufficiency by exam. We will followup an echocardiogram to assess the success of his mitral valve repair.  . Other chest pain 06/01/2019   Last Assessment & Plan:  Formatting of this note might be different from the original. -known COVID19 with known CAD and cardiomyopathy -trend troponins -consider other etiologies like cardiac involvement with COVID19 -consult DR Gavin Potters -consider echo (although none available until 6/22) -await CT thorax result  . Palliative care by specialist   . Persistent atrial fibrillation ([place])    a. noted on ICD interrogation '10 - not previously on [place] - CHA2DS2VASc = 5.  . Pneumonia, unspecified organism 09/22/2018  . S/P coronary artery stent placement   . Severe episode of recurrent major depressive disorder, without psychotic features ([place]) 02/07/2020  . Shortness of breath   . Syncope 09/15/2012  . Tick bite  06/02/2019   Last Assessment & Plan:  -reported by ER staff; had tick on umbilicus (removed) -monitor for any signs requiring treatment (I.e. rash, fever)  Last Assessment & Plan:  Formatting of this note might be different from the original. -reported by ER staff; had tick on umbilicus (removed) -monitor for any signs requiring treatment (I.e. rash, fever)  . TSH elevation   . Type II diabetes mellitus (HCC)    uncontrolled  . Unstable angina ([place]) 02/19/2012    Medications:  Medications Prior to Admission  Medication Sig Dispense Refill Last Dose  . ALPRAZolam (XANAX) 0.25 MG tablet Take 0.25 mg by mouth 3 (three) times daily as needed for anxiety.    unknown  . amiodarone (PACERONE) 200 MG tablet Take 200 mg by mouth daily.    03/11/2020 at Unknown time  . apixaban (ELIQUIS) 5 MG TABS tablet Take 1 tablet (5 mg total) by mouth 2 (two) times daily. 60 tablet 1 03/11/2020 at 0600  . atorvastatin (LIPITOR) 40 MG tablet Take 1 tablet (40 mg total) by mouth daily at 6 PM. 30 tablet 0 03/10/2020 at Unknown time  . citalopram (CELEXA) 20 MG tablet Take 20 mg by  mouth daily.    03/11/2020 at Unknown time  . furosemide (LASIX) 40 MG tablet Take 40 mg by mouth daily.    03/11/2020 at Unknown time  . insulin aspart (NOVOLOG FLEXPEN) 100 UNIT/ML FlexPen Inject 6 Units into the skin 3 (three) times daily with meals. 15 mL 0 03/11/2020 at Unknown time  . Insulin Glargine (BASAGLAR KWIKPEN) 100 UNIT/ML SOPN Inject 0.5 mLs (50 Units total) into the skin daily. 15 mL 1 03/11/2020 at Unknown time  . metoprolol succinate (TOPROL-XL) 100 MG 24 hr tablet Take 1 tablet (100 mg total) by mouth daily. 90 tablet 3 03/11/2020 at 0600  . nitroGLYCERIN (NITROSTAT) 0.4 MG SL tablet Place 1 tablet (0.4 mg total) under the tongue every 5 (five) minutes x 3 doses as needed for chest pain. 10 tablet 0 unknown  . pantoprazole (PROTONIX) 40 MG tablet Take 1 tablet (40 mg total) by mouth daily. 30 tablet 1 03/11/2020 at Unknown time  . potassium chloride SA (KLOR-CON) 20 MEQ tablet Take 20 mEq by mouth daily.    03/11/2020 at Unknown time  . tamsulosin (FLOMAX) 0.4 MG CAPS capsule Take 1 capsule (0.4 mg total) by mouth daily. 30 capsule 0 03/11/2020 at Unknown time  . ALPRAZolam (XANAX) 1 MG tablet Take 1 tablet (1 mg total) by mouth 2 (two) times daily as needed for anxiety. (Patient not taking: Reported on 03/04/2020) 10 tablet 0 Not Taking at Unknown time    Assessment: 33 YOM with atrial fibrillation on Eliquis PTA s/p R & L heart cath found to have 3v disease s/p  LVAD  Implant 4/8. Warfarin initiated on 4/9 after discussing with MD.  CT on 4/15 now found to have large right MCA stroke. INR had been elevated up to 9.0 postop, he received 1 dose of vitamin K 1 mg IV on 4/13 and again on 4/15 with INR still elevated at 6.2.   Warfarin resumed cautiously on 4/17. Held doses since 4/20 since he went to the OR for VATS. Warfarin was cautiously resumed on 4/23.  INR is above goal at 3.1. Hgb down 8.1, Pltc 125. LDH stable 259. No bleeding noted.  Goal of Therapy:  INR 2-2.5 Monitor  platelets by anticoagulation protocol: Yes   Plan:  -Hold warfarin today -Daily INR and CBC  Vertis Kelch, PharmD, [facility] PGY2 Cardiology Pharmacy Resident Phone [phone] 04/09/2020       7:55 AM  Please check [email or web address] for unit-specific pharmacist phone numbers

## 2020-04-09 NOTE — Plan of Care (Signed)
  Problem: Cardiac: Goal: Ability to maintain an adequate cardiac output will improve Outcome: Progressing   Problem: Fluid Volume: Goal: Risk for excess fluid volume will decrease Outcome: Progressing  Pt on CRRT Problem: Activity: Goal: Ability to tolerate increased activity will improve Outcome: Progressing   Problem: Respiratory: Goal: Ability to maintain a clear airway and adequate ventilation will improve Outcome: Progressing  Pt extubated

## 2020-04-09 NOTE — Progress Notes (Signed)
Inpatient Rehabilitation-Admissions Coordinator   Pt sleeping soundly upon visit at the bedside. Noted pt still on CRRT and not quiet ready for CIR. AC will continue to follow along for progress with therapy and medical stability. Will need to address family support and willingness to participate in an intensive program in the next few visits.   Please call if questions.   Raechel Ache, OTR/L  Rehab Admissions Coordinator  [phone] 04/09/2020 3:40 PM

## 2020-04-09 NOTE — Plan of Care (Signed)
  Problem: Cardiovascular: Goal: Ability to achieve and maintain adequate cardiovascular perfusion will improve Outcome: Progressing   Problem: Health Behavior/Discharge Planning: Goal: Ability to safely manage health-related needs after discharge will improve Outcome: Progressing   Problem: Education: Goal: Understanding of CV disease, CV risk reduction, and recovery process will improve Outcome: Progressing   Problem: Activity: Goal: Ability to return to baseline activity level will improve Outcome: Progressing   Problem: Cardiovascular: Goal: Ability to achieve and maintain adequate cardiovascular perfusion will improve Outcome: Progressing   Problem: Education: Goal: Knowledge of General Education information will improve Description: Including pain rating scale, medication(s)/side effects and non-pharmacologic comfort measures Outcome: Progressing   Problem: Health Behavior/Discharge Planning: Goal: Ability to manage health-related needs will improve Outcome: Progressing   Problem: Clinical Measurements: Goal: Ability to maintain clinical measurements within normal limits will improve Outcome: Progressing Goal: Will remain free from infection Outcome: Progressing Goal: Diagnostic test results will improve Outcome: Progressing Goal: Respiratory complications will improve Outcome: Progressing Goal: Cardiovascular complication will be avoided Outcome: Progressing   Problem: Activity: Goal: Risk for activity intolerance will decrease Outcome: Progressing   Problem: Nutrition: Goal: Adequate nutrition will be maintained Outcome: Progressing   Problem: Coping: Goal: Level of anxiety will decrease Outcome: Progressing   Problem: Elimination: Goal: Will not experience complications related to bowel motility Outcome: Progressing Goal: Will not experience complications related to urinary retention Outcome: Progressing   Problem: Safety: Goal: Ability to remain  free from injury will improve Outcome: Progressing   Problem: Skin Integrity: Goal: Risk for impaired skin integrity will decrease Outcome: Progressing   Problem: Education: Goal: Knowledge of the prescribed therapeutic regimen will improve Outcome: Progressing   Problem: Activity: Goal: Risk for activity intolerance will decrease Outcome: Progressing   Problem: Cardiac: Goal: Ability to maintain an adequate cardiac output will improve Outcome: Progressing   Problem: Coping: Goal: Level of anxiety will decrease Outcome: Progressing   Problem: Fluid Volume: Goal: Risk for excess fluid volume will decrease Outcome: Progressing   Problem: Clinical Measurements: Goal: Ability to maintain clinical measurements within normal limits will improve Outcome: Progressing Goal: Will remain free from infection Outcome: Progressing   Problem: Respiratory: Goal: Will regain and/or maintain adequate ventilation Outcome: Progressing   Problem: Education: Goal: Knowledge of secondary prevention will improve Outcome: Progressing Goal: Knowledge of patient specific risk factors addressed and post discharge goals established will improve Outcome: Progressing Goal: Individualized Educational Video(s) Outcome: Progressing   Problem: Activity: Goal: Ability to tolerate increased activity will improve Outcome: Progressing   Problem: Respiratory: Goal: Ability to maintain a clear airway and adequate ventilation will improve Outcome: Progressing   Problem: Role Relationship: Goal: Method of communication will improve Outcome: Progressing

## 2020-04-09 NOTE — Progress Notes (Signed)
  Speech Language Pathology Treatment: Dysphagia  Patient Details Name: [name] [name] MRN: [id number] DOB: 08/07/1951 Today's Date: 04/09/2020 Time: 2800-3491 SLP Time Calculation (min) (ACUTE ONLY): 15 min  Assessment / Plan / Recommendation Clinical Impression  PT seen at bedside to assess readiness for po intake and/or instrumental assessment. Pt was sleeping soundly upon arrival of SLP, but roused easily with verbal stim and wet cloth. Pt was nonverbal during this assessment, but was able to answer yes/no questions. Pt was educated regarding purpose of SLP visit. Oral care was attempted with suction. Pt did not open his mouth to allow oral care, and indicated he did not want ice chips or puree trials. Consulted with RN, who reports pt was more alert and conversant this morning, but has been variable re: cooperativeness. Will retry next date during the morning, to hopefully facilitate better participation.   HPI HPI: [name] [name] is a 69 year old gentleman with a history of ischemic cardiomyopathy who presented for an LVAD implantation and left atrial appendage clipping with sternal reconstruction on 4/8, extubated 4/9. Started CRRT on 7/91  He had a complicated postop course including a right MCA stroke on 4/15. Evaluated by SLP 4/17, started on a diet.  He had a cardiac arrest on 4/20 requiring reintubation until 4/25. BSE repeated 4/26, with recommendation for NPO. His mental status has been improving slowly for several days.      SLP Plan  Continue with current plan of care       Recommendations  Diet recommendations: NPO Medication Administration: Via alternative means                Oral Care Recommendations: Oral care QID Follow up Recommendations: Skilled Nursing facility SLP Visit Diagnosis: Dysphagia, oropharyngeal phase (R13.12) Plan: Continue with current plan of care       GO              Janani Chamber B. Quentin Ore, [facility], [place] Speech Language  Pathologist Office: [phone] Pager: [phone]  Shonna Chock 04/09/2020, 2:30 PM

## 2020-04-09 NOTE — Progress Notes (Signed)
Admit: 02/15/2020 LOS: 71  79M dialysis dependent AKI on CRRT, acute CHF exacerbation s/p VAD, status post MV repair, AFib s/p MAZE and LAA clipping 4/8; R MCA CVA 4/15; s/p hemothorax and VATS 4/20  S: Seen and examined on CRRT. Procedure supervised. Had 3.1 liters UF over 4/27 with CRRT as charted thus far.  Anuric.  Was kept even.  On levo at 5 (down from 14 yest AM) and milrinone.  cvp 8.  Changed to all 4K yesterday.  Has been on 4 liters oxygen.   Review of systems:   Denies overt shortness of breath Denies n/v  On tube feeds Rectal tube  O: 04/27 0701 - 04/28 0700 In: 3141.3 [I.V.:1341.3; NG/GT:1200; IV Piggyback:600] Out: 3566 [Stool:500]  Filed Weights   04/05/20 0500 04/07/20 0615 04/08/20 0600  Weight: 104.5 kg 99.9 kg 100 kg   Physical exam  General adult male in bed critically ill  HEENT normocephalic atraumatic  Lungs coarse breath sounds  Heart S1S2 on levo and milrinone Abdomen soft nontender nondistended Extremities no pitting edema  Neuro - awakens to voice; answers questions  Access: right [place] nontunneled catheter     Recent Labs  Lab 04/08/20 0350 04/08/20 1632 04/09/20 0438  NA 137 138 138  K 4.0 4.3 4.2  CL 101 103 105  CO2 25 26 21*  GLUCOSE 132* 103* 117*  BUN 32* 33* 32*  CREATININE 1.88* 1.91* 1.83*  CALCIUM 8.4* 8.4* 8.3*  PHOS 2.6 2.7 2.3*   Recent Labs  Lab 04/07/20 0311 04/07/20 0311 04/07/20 0802 04/08/20 0350 04/09/20 0438  WBC 14.3*  --   --  11.9* 8.0  HGB 8.8*   < > 9.5* 8.9* 8.1*  HCT 28.4*   < > 28.0* 28.3* 25.7*  MCV 91.3  --   --  92.5 90.8  PLT 151  --   --  152 125*   < > = values in this interval not displayed.    Scheduled Meds: . sodium chloride   Intravenous Once  . aspirin  81 mg Per Tube Daily  . atorvastatin  80 mg Per Tube q1800  . B-complex with vitamin C  1 tablet Per Tube Daily  . bisacodyl  10 mg Oral Daily   Or  . bisacodyl  10 mg Rectal Daily  . Chlorhexidine Gluconate Cloth  6 each Topical  Daily  . docusate  200 mg Per Tube Daily  . feeding supplement (PRO-STAT SUGAR FREE 64)  30 mL Per Tube QID  . influenza vaccine adjuvanted  0.5 mL Intramuscular Tomorrow-1000  . insulin aspart  0-24 Units Subcutaneous Q4H  . insulin aspart  6 Units Subcutaneous Q4H  . insulin glargine  24 Units Subcutaneous BID  . mouth rinse  15 mL Mouth Rinse BID  . melatonin  3 mg Per Tube QHS  . metoCLOPramide (REGLAN) injection  5 mg Intravenous Q8H  . pantoprazole (PROTONIX) IV  40 mg Intravenous Daily  . polyethylene glycol  17 g Per Tube Daily  . QUEtiapine  25 mg Per Tube QHS  . sildenafil  20 mg Per Tube TID  . sodium chloride flush  10-40 mL Intracatheter Q12H  . Warfarin - Pharmacist Dosing Inpatient   Does not apply q1600   Continuous Infusions: .  prismasol BGK 4/2.5 500 mL/hr at 04/09/20 0315  .  prismasol BGK 4/2.5 300 mL/hr at 04/08/20 2342  . sodium chloride Stopped (03/22/20 2011)  . sodium chloride 10 mL/hr at 04/09/20 0000  .  sodium chloride    . sodium chloride 10 mL/hr at 04/05/20 1815  . sodium chloride    . amiodarone 30 mg/hr (04/09/20 0435)  . dexmedetomidine (PRECEDEX) IV infusion Stopped (04/07/20 3903)  . epinephrine Stopped (04/05/20 0854)  . feeding supplement (VITAL 1.5 CAL) 50 mL/hr at 04/09/20 0000  . lactated ringers Stopped (03/29/20 0748)  . milrinone 0.125 mcg/kg/min (04/09/20 0000)  . norepinephrine (LEVOPHED) Adult infusion 5 mcg/min (04/09/20 0000)  . prismasol BGK 4/2.5 1,500 mL/hr at 04/09/20 0315  . vasopressin (PITRESSIN) infusion - *FOR SHOCK* Stopped (04/07/20 0515)   PRN Meds:.sodium chloride, acetaminophen, dextrose, fentaNYL (SUBLIMAZE) injection, heparin, heparin, hydrALAZINE, HYDROcodone-acetaminophen, levalbuterol, ondansetron (ZOFRAN) IV, ondansetron (ZOFRAN) IV, oxyCODONE, sodium chloride, sodium chloride flush, sorbitol, traMADol  ABG    Component Value Date/Time   PHART 7.383 04/07/2020 0802   PCO2ART 45.2 04/07/2020 0802   PO2ART 72  (L) 04/07/2020 0802   HCO3 27.3 04/07/2020 0802   TCO2 29 04/07/2020 0802   ACIDBASEDEF 2.0 03/31/2020 1405   O2SAT 77.1 04/09/2020 0438    A/P  1. Anuric Dialysis dependent AKI.  CRRT started 4/13.  on CRRT.  UF is currently at keep even.  Continue same for now.  Please contact nephrology for any UF goal changes. Now on all 4K fluids - if K rises will transition post back to 0 K. For now will keep on CRRT while on pressors then assess transition to iHD.  no circuit anticoagluation.  systemic anticoagulation on hold after hemothorax 2. Hypophosphatemia, replete prn for P < 2.5.  repleting phos this AM. 3. sCHF s/p LVAD 4/8 4. Hemothorax s/p VATS 04/09/2020.  5. AFib s/p MAZE and LAA clipping 4/8 6. DM2 - per primary team  7. R MCA ischemic CVA - noted  8. Cardiogenic Shock - on norepi and milrinone   9. Anemia - normocytic - no acute indication for PRBC's.  Started ESA  - aranesp 40 mcg IV once on 4/27 (Tues)   Claudia Desanctis, MD 04/09/2020 5:56 AM

## 2020-04-09 NOTE — Progress Notes (Signed)
LVAD Coordinator Rounding Note:  HM III LVAD implanted on 03/31/2020 with MAZE procedure + LAA clipping  by Dr Orvan Seen under destination therapy criteria due to advanced age. Primary Heart Failure Cardiologist is Dr. Haroldine Laws.   Pt sitting up in bed watching TV. Denies pain. Able to verbalize using short sentences.   Pt remains on CVVH; nurse reports keeping even.  Vital signs: Temp: 98.7 HR: 89 - afib A line: 104/67 (78)  Cuff: 97/81 (88) O2 Sat: 98% on 3L Wt: 253.3>262.3>260.8>259.9>237.8>220>223>222.8>236.9>235>233.5>220.2>220.4>219.5 lbs   LVAD interrogation reveals:  Speed: 5300 Flow: 4.4 Power: 3.6w  PI: 3.6  Alarms: none  Events: none Hematocrit: 25  Fixed speed: 5300 Low speed limit: 5000  CRRT: keeping even  Drive Line: VAD dressing C/D/I; anchor intact and accurately applied. Drive line anchor intact. Will advance to twice weekly (Monday / Thursday) dressing changes per nurse champion or VAD coordinator.  Next dressing change due 04/10/20.      Labs:  LDH trend: 360>513>482>467>453>454>474>428>415>422>349>305>279>283>273>259  INR trend: 1.3>6.2>9>4.4>6.2>2.2>2.4>2.2>2.0>2.2>2.3>3.4>2.4>1.8>2.8>3.1  CR: 3.57>3.98>3.18>2.44>2.41>2.0>2.1>1.96>3.29>4.3>3.6>2.53>2.25>2.16>1.88>1.83  Anticoagulation Plan: -INR Goal: 2.0-2.5 -ASA Dose: 81 mg   Respiratory: - extubated 4/9/2 - re-intubated 03/26/2020 s/p left hemothorax with cardiac arrest  Nitric Oxide: off 03/23/20  Blood Products:  Intraop: 6 FFP  DDAVP  420 cell saver  04/11/2020>>4 FFP 03/21/20>> 1 RBC 03/22/20>>1 RBC  Post-op: 04/07/2020>>3 PC's, 2 FFP  Intra-op: 03/13/2020>>2 PCs      2 FFP  04/05/20: 2 PRBCs  Cultures:  -03/25/20- blood cultures>>negative - 03/14/2020-blood cultures>>negative  Device: -St [name] -Therapies: VF 194 - therapy on  Drips: - Milrinone 0.125 mcg/kg/min >> off 04/09/20 - Levo 5 mcg/min - Amiodarone 30 mg/hr - Tube feed: 50 mL/hr   Adverse Events on VAD: >>03/25/20 CVVH  Started >>04/09/2020 left hemothorax; cardiac arrest  Pt Education:  1. Began basic education with patient today. Showed patient battery and clips. Demonstrated placing battery in, and taking out, of clips. Patient return demonstrated with repeated verbal cues and assistance. Unsure of patient's ability to remember teaching.  2. Discussed with bedside RN regarding working with patient on batteries/clips and power changes if able.  3. No family at bedside.  Plan/Recommendations:  1. Call VAD coordinator for any equipment or drive line issues.  2. Dressing changes Monday / Thursday per nurse champion or VAD coordinator.   Emerson Monte RN [place] Coordinator  Office: [phone]  24/7 Pager: [phone]

## 2020-04-09 NOTE — Progress Notes (Addendum)
Patient ID: [name] [name], male   DOB: 1950/12/24, 69 y.o.   MRN: [id number]    Advanced Heart Failure Rounding Note   Subjective:    - S/p HM3 VAD 4/8 w MAZE procedure + LAA clipping. - Extubated 4/9 - 4/12 LVAD speed increased to 5400 --> echo moderate-severe RV dysfunction, septum mildly left shifted, trivial pericardial effusion.  LVAD speed cut back to 5300.  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  - 4/20 developed hemothorax requiring emergent CT placement and eventual VATS - 4/21 EEG done given concerns for posturing and c/w moderate to severe diffuse encephalopathy. No seizures or epileptiform discharges were seen  - 04/03/20 Pump Stop --> controller change out. CT of head unchanged R MCA CVA - 04/06/20 Extubated  On NE 5 mcg milrinone 0.125 . CO-OX 77%   Remains anuric. On CVVHD keeping even.  Says he is tired. Denies SOB.     VAD Interrogation  Flow 4.5 , Speed 5350, Power 4.0  Pulse Index 3.4   VAD interrogated personally. Parameters stable.  Objective:   Weight Range:  Vital Signs:   Temp:  [97.5 F (36.4 C)-98.5 F (36.9 C)] 97.5 F (36.4 C) (04/28 0700) Pulse Rate:  [75-164] 87 (04/28 0700) Resp:  [10-18] 13 (04/28 0700) BP: (74-129)/(14-119) 129/119 (04/28 0600) SpO2:  [93 %-100 %] 98 % (04/28 0700) Arterial Line BP: (75-133)/(56-88) 92/67 (04/28 0700) Weight:  [99.6 kg] 99.6 kg (04/28 0600) Last BM Date: 04/08/20  Weight change: Filed Weights   04/07/20 0615 04/08/20 0600 04/09/20 0600  Weight: 99.9 kg 100 kg 99.6 kg    Intake/Output:   Intake/Output Summary (Last 24 hours) at 04/09/2020 0811 Last data filed at 04/09/2020 0700 Gross per 24 hour  Intake 2729.58 ml  Output 3869 ml  Net -1139.42 ml        CVP 6  Physical Exam: GENERAL: In bed. No acute distress HEENT: normal  NECK: Supple, JVP 6-7 no bruits.  No lymphadenopathy or thyromegaly appreciated.  RIJ R subclavian  CARDIAC:  Mechanical heart sounds with LVAD hum  present.  LUNGS:  Clear to auscultation bilaterally.  ABDOMEN:  Soft, round, nontender, positive bowel sounds x4.     LVAD exit site: well-healed and incorporated.  Dressing dry and intact.  No erythema or drainage.  Stabilization device present and accurately applied.  Driveline dressing is being changed daily per sterile technique. EXTREMITIES:  Warm and dry, no cyanosis, clubbing, rash or edema  NEUROLOGIC:  Alert and oriented x 3  No aphasia.  No dysarthria.  Affect flat      Telemetry: A fib 80s     Labs: Basic Metabolic Panel: Recent Labs  Lab 04/05/20 0323 04/05/20 1548 04/06/20 0256 04/06/20 0312 04/07/20 0311 04/07/20 0311 04/07/20 0802 04/07/20 1536 04/07/20 1536 04/08/20 0350 04/08/20 1632 04/09/20 0438  NA 138   < > 137   < > 136   < > 137 137  --  137 138 138  K 5.2*   < > 4.7   < > 4.5   < > 4.3 4.6  --  4.0 4.3 4.2  CL 105   < > 104   < > 101  --   --  101  --  101 103 105  CO2 26   < > 25   < > 24  --   --  24  --  25 26 21*  GLUCOSE 156*   < >  200*   < > 131*  --   --  195*  --  132* 103* 117*  BUN 30*   < > 32*   < > 34*  --   --  32*  --  32* 33* 32*  CREATININE 1.78*   < > 1.80*   < > 2.16*  --   --  1.96*  --  1.88* 1.91* 1.83*  CALCIUM 8.4*   < > 8.1*   < > 8.1*   < >  --  8.5*   < > 8.4* 8.4* 8.3*  MG 2.6*  --  2.6*  --  2.5*  --   --   --   --  2.5*  --  2.6*  PHOS 2.7   < > 2.2*   < > 3.2  --   --  3.3  --  2.6 2.7 2.3*   < > = values in this interval not displayed.    Liver Function Tests: Recent Labs  Lab 04/03/20 0329 04/03/20 1607 04/04/20 0332 04/04/20 1610 04/05/20 0323 04/05/20 1548 04/06/20 0256 04/06/20 1556 04/07/20 0311 04/07/20 1536 04/08/20 0350 04/08/20 1632 04/09/20 0438  AST 37  --  35  --  33  --  34  --   --   --   --   --   --   ALT 22  --  22  --  23  --  21  --   --   --   --   --   --   ALKPHOS 119  --  112  --  142*  --  129*  --   --   --   --   --   --   BILITOT 0.8  --  0.9  --  0.6  --  0.8  --   --   --    --   --   --   PROT 5.2*  --  5.6*  --  5.9*  --  5.7*  --   --   --   --   --   --   ALBUMIN 2.3*  2.2*   < > 2.2*  2.1*   < > 2.1*  2.2*   < > 2.1*  2.2*   < > 2.0* 2.1* 2.0* 2.2* 2.4*   < > = values in this interval not displayed.   No results for input(s): LIPASE, AMYLASE in the last 168 hours. No results for input(s): AMMONIA in the last 168 hours.  CBC: Recent Labs  Lab 04/05/20 1804 04/05/20 1804 04/06/20 0256 04/06/20 0312 04/06/20 2004 04/07/20 0311 04/07/20 0802 04/08/20 0350 04/09/20 0438  WBC 7.8  --  8.8  --   --  14.3*  --  11.9* 8.0  HGB 8.2*   < > 8.4*   < > 9.5* 8.8* 9.5* 8.9* 8.1*  HCT 27.0*   < > 27.5*   < > 28.0* 28.4* 28.0* 28.3* 25.7*  MCV 94.1  --  92.3  --   --  91.3  --  92.5 90.8  PLT 120*  --  131*  --   --  151  --  152 125*   < > = values in this interval not displayed.    Cardiac Enzymes: No results for input(s): CKTOTAL, CKMB, CKMBINDEX, TROPONINI in the last 168 hours.  BNP: BNP (last 3 results) Recent Labs    03/21/20 0209 03/26/20 2329 04/02/20 2342  BNP 315.9* 425.7*  332.7*    ProBNP (last 3 results) No results for input(s): PROBNP in the last 8760 hours.    Other results:  Imaging: DG Chest 1 View  Result Date: 04/07/2020 CLINICAL DATA:  Post open heart surgery EXAM: CHEST  1 VIEW COMPARISON:  04/06/2020 FINDINGS: LVAD and left AICD remain in place, unchanged. Interval removal of endotracheal tube. Right dialysis catheter is unchanged. Cardiomegaly. Worsening aeration in the left lung may reflect a combination of left effusion and atelectasis/consolidation. Minimal right base atelectasis. Mild vascular congestion. IMPRESSION: Support devices as above, in expected position. Cardiomegaly, vascular congestion. Increasing left lung airspace disease/effusion. Electronically Signed   By: Rolm Baptise M.D.   On: 04/07/2020 08:56   DG Abd 1 View  Result Date: 04/07/2020 CLINICAL DATA:  Feeding tube placement. EXAM: ABDOMEN - 1  VIEW COMPARISON:  04/06/2020 FINDINGS: Left ventricular assist device is again noted. The feeding tube tip is in the distal descending duodenum near its junction with the third portion the duodenum. The bowel gas pattern is unremarkable. IMPRESSION: Feeding tube tip is in the distal descending duodenum. Electronically Signed   By: Marijo Sanes M.D.   On: 04/07/2020 12:38   DG Chest Port 1 View  Result Date: 04/08/2020 CLINICAL DATA:  LVAD EXAM: PORTABLE CHEST 1 VIEW COMPARISON:  Yesterday FINDINGS: LVAD with partial coverage. Left atrial clipping. ICD in place. Sheath and dialysis catheter on the right. There is cardiopericardial enlargement and diffuse interstitial and airspace density. A feeding tube at least reaches the diaphragm. Left-sided aeration is improved. IMPRESSION: 1. Improved left-sided aeration. 2. Increased interstitial opacity likely reflecting edema. Electronically Signed   By: Monte Fantasia M.D.   On: 04/08/2020 08:53     Medications:     Scheduled Medications: . sodium chloride   Intravenous Once  . aspirin  81 mg Per Tube Daily  . atorvastatin  80 mg Per Tube q1800  . B-complex with vitamin C  1 tablet Per Tube Daily  . bisacodyl  10 mg Oral Daily   Or  . bisacodyl  10 mg Rectal Daily  . Chlorhexidine Gluconate Cloth  6 each Topical Daily  . docusate  200 mg Per Tube Daily  . feeding supplement (PRO-STAT SUGAR FREE 64)  30 mL Per Tube QID  . influenza vaccine adjuvanted  0.5 mL Intramuscular Tomorrow-1000  . insulin aspart  0-24 Units Subcutaneous Q4H  . insulin aspart  6 Units Subcutaneous Q4H  . insulin glargine  24 Units Subcutaneous BID  . mouth rinse  15 mL Mouth Rinse BID  . melatonin  3 mg Per Tube QHS  . metoCLOPramide (REGLAN) injection  5 mg Intravenous Q8H  . pantoprazole (PROTONIX) IV  40 mg Intravenous Daily  . polyethylene glycol  17 g Per Tube Daily  . QUEtiapine  25 mg Per Tube QHS  . sildenafil  20 mg Per Tube TID  . sodium chloride flush   10-40 mL Intracatheter Q12H  . Warfarin - Pharmacist Dosing Inpatient   Does not apply q1600    Infusions: .  prismasol BGK 4/2.5 500 mL/hr at 04/09/20 0315  .  prismasol BGK 4/2.5 300 mL/hr at 04/08/20 2342  . sodium chloride Stopped (03/22/20 2011)  . sodium chloride 10 mL/hr at 04/09/20 0000  . sodium chloride    . sodium chloride 10 mL/hr at 04/05/20 1815  . sodium chloride    . amiodarone 30 mg/hr (04/09/20 0435)  . dexmedetomidine (PRECEDEX) IV infusion Stopped (04/07/20 3559)  . epinephrine  Stopped (04/05/20 0854)  . feeding supplement (VITAL 1.5 CAL) 50 mL/hr at 04/09/20 0000  . lactated ringers Stopped (03/29/20 0748)  . milrinone 0.125 mcg/kg/min (04/09/20 0000)  . norepinephrine (LEVOPHED) Adult infusion 5 mcg/min (04/09/20 0556)  . prismasol BGK 4/2.5 1,500 mL/hr at 04/09/20 0629  . sodium phosphate  Dextrose 5% IVPB 30 mmol (04/09/20 0804)  . vasopressin (PITRESSIN) infusion - *FOR SHOCK* Stopped (04/07/20 0515)    PRN Medications: sodium chloride, acetaminophen, dextrose, fentaNYL (SUBLIMAZE) injection, heparin, heparin, hydrALAZINE, HYDROcodone-acetaminophen, levalbuterol, ondansetron (ZOFRAN) IV, ondansetron (ZOFRAN) IV, oxyCODONE, sodium chloride, sodium chloride flush, sorbitol, traMADol   Assessment/Plan:   1. Acute on chronic systolic HF -> cardiogenic shock-> S/p HM3 LVAD - Echo 2016 EF 30-35% - Echo 1/21 EF 25% - Admitted with NYHA IV symptoms and AKI with attempts at diuresis.  - Echo this admit: EF 10-15% moderate RV dysfunction - R/LHC cath 3/21 with severe 3v CAD and low output with CI 1.7.  - Swan placed. Initial co-ox 39%. Initial PAPi 1.5. Started on milrinone 0.25. - Initially planned for CABG but given need for re-do sternotomy, relatively poor targets and longstanding low EF, VAD felt to be better option  - S/p HM3 VAD 4/8 - Speed turned up on 4/11 to  5400, but looking at 4/10 echo there is significant RV dysfunction with leftward septal shift  so speed decreased back to 5300 rpm.  - Developed hemothorax on 4/20 requiring emergent chest tube and VATS - CO-OX 77%. Stop milrinone. Continue to wean norepinephrine. Add midodrine 5 mg tid.  - Volume status looks good on CVVHD. Remains anuric.   2. VAD - VAD interrogated personally. Parameters stable. - Had Pump Stop 04/03/20. Controller changed out with old controller sent back to [facility] for evaluation.  - on aspirin 81 mg daily + coumadin.  - LDH 259  - INR  3.1   Discussed dosing with PharmD personally. - Driveline ok   3. CAD with unstable angina - s/p previous PCI - cath 02/29/2020 with severe 3v CAD - now s/p VAD on 04/11/2020    4. AKI on CKD 3a - Nephrology consulted. Started CVVHD 4/13  - Renal US 4/5 unremarkable.  -  Hopefully renal function will recover but Renal team not optimistic  5. Permanent AF - now s/p MAZE + LAA Clipping 4/8   - Rate controlled -Stop amiodarone once off norepi.  - On coumadin. INR 3.1   6. MVP s/p MV repair - On admit with recurrent severe MR on echo and with huge v-waves on PCWP tracing - s/p VAD  7. Hemothoax - s/p CT placement and VATs on 4/22 - CTs pulled 4/26 this am   8. ID/leukocytosis - 4/11 procalcitonin 1.96  - Started empiric coverage for PNA with vancomycin/cefepime for 7 days. Stopped 4/17 - Given recent VATs and high-dose pressor requirement will cover with broad spectrum abx for now, linezolid and cefepime (completing course 4/28)  -WBC 8.8->14.3->11.1.   9. Acute Hypoxic Respiratory Failure - Re-intubated 4/20 with hemothorax- - Extubated 4/25 - Stable on [place] today  10. Malnutrition  - Albumin 2.6  - On tube feeds.  - Speech following. Plan for FEEs once he is more awake.     11. CVA/anoxic brain injury - 03/27/20 CT large posterior MCA ischemic infarct, possibly from atrial fibrillation.  INR was supratherapeutic when CVA found.  Stable LDH, do not think partial pump thrombosis is the culprit. - awake and  moving all 4 extremities today.  -  PT/OT/speech following.  - Speech to follow up today. Hopefully can get FEEs.  - CIR consulted.    12. Anemia, post-op   Received 2u RBC 04/05/20 - Hgb stable 8.1  - Transfuse if hemoglobin < 7.5   13. Hyperkalemia -Resolved.  - Continue CVVHD  14. DM2, poorly controlled - hgbA1c 11.1% - insulin on hold due to hypoglycemia    Length of Stay: [place] NP-C  04/09/2020, 8:11 AM  Advanced Heart Failure Team Pager [phone] (M-F; 7a - 4p)  Please contact [facility] for night-coverage after hours (4p -7a ) and weekends on [email or web address]   Agree with above.   Remains on CVVHD keeping even. Weight stable. On milrinone 0.125 and NE at 6. Midodrine started today. Remains in AF with good rate control on IV amio. FEES not done yesterday due to lethargy. More alert today  General:  Awake in bed. More alert  HEENT: normal  Neck: supple. RIJ TLC Carotids 2+ bilat; no bruits. No lymphadenopathy or thryomegaly appreciated. Cor: LVAD hum.  Lungs: Clear. Decreased left base Abdomen: obese soft, nontender, non-distended. No hepatosplenomegaly. No bruits or masses. Good bowel sounds. Driveline site clean. Anchor in place.  Extremities: no cyanosis, clubbing, rash. Warm no edema  Neuro: alert & oriented x 3. No focal deficits. Moves all 4 without problem   Can stop milrinone. Wean NE. Start midodrine. Keep even on CVVHD. Needs aggressive rehab. INR 3.1. LDH 259. VAD interrogated personally. Parameters stable.  CRITICAL CARE Performed by: Glori Bickers  Total critical care time: 35 minutes  Critical care time was exclusive of separately billable procedures and treating other patients.  Critical care was necessary to treat or prevent imminent or life-threatening deterioration.  Critical care was time spent personally by me (independent of midlevel providers or residents) on the following activities: development of treatment plan with patient  and/or surrogate as well as nursing, discussions with consultants, evaluation of patient's response to treatment, examination of patient, obtaining history from patient or surrogate, ordering and performing treatments and interventions, ordering and review of laboratory studies, ordering and review of radiographic studies, pulse oximetry and re-evaluation of patient's condition.  Glori Bickers, MD  8:46 AM

## 2020-04-10 ENCOUNTER — Inpatient Hospital Stay (HOSPITAL_COMMUNITY): Payer: Medicare HMO

## 2020-04-10 DIAGNOSIS — R570 Cardiogenic shock: Secondary | ICD-10-CM | POA: Diagnosis not present

## 2020-04-10 LAB — BRAIN NATRIURETIC PEPTIDE: B Natriuretic Peptide: 267.2 pg/mL — ABNORMAL HIGH (ref 0.0–100.0)

## 2020-04-10 LAB — COOXEMETRY PANEL
Carboxyhemoglobin: 2 % — ABNORMAL HIGH (ref 0.5–1.5)
Methemoglobin: 1 % (ref 0.0–1.5)
O2 Saturation: 66.9 %
Total hemoglobin: 10 g/dL — ABNORMAL LOW (ref 12.0–16.0)

## 2020-04-10 LAB — RENAL FUNCTION PANEL
Albumin: 2.3 g/dL — ABNORMAL LOW (ref 3.5–5.0)
Albumin: 2.3 g/dL — ABNORMAL LOW (ref 3.5–5.0)
Anion gap: 8 (ref 5–15)
Anion gap: 9 (ref 5–15)
BUN: 31 mg/dL — ABNORMAL HIGH (ref 8–23)
BUN: 32 mg/dL — ABNORMAL HIGH (ref 8–23)
CO2: 28 mmol/L (ref 22–32)
CO2: 24 mmol/L (ref 22–32)
Calcium: 8.5 mg/dL — ABNORMAL LOW (ref 8.9–10.3)
Calcium: 8.4 mg/dL — ABNORMAL LOW (ref 8.9–10.3)
Chloride: 102 mmol/L (ref 98–111)
Chloride: 104 mmol/L (ref 98–111)
Creatinine, Ser: 1.88 mg/dL — ABNORMAL HIGH (ref 0.61–1.24)
Creatinine, Ser: 2.03 mg/dL — ABNORMAL HIGH (ref 0.61–1.24)
GFR calc Af Amer: 41 mL/min — ABNORMAL LOW (ref >60)
GFR calc Af Amer: 37 mL/min — ABNORMAL LOW (ref >60)
GFR calc non Af Amer: 35 mL/min — ABNORMAL LOW (ref >60)
GFR calc non Af Amer: 32 mL/min — ABNORMAL LOW (ref >60)
Glucose, Bld: 137 mg/dL — ABNORMAL HIGH (ref 70–99)
Glucose, Bld: 137 mg/dL — ABNORMAL HIGH (ref 70–99)
Phosphorus: 2.6 mg/dL (ref 2.5–4.6)
Phosphorus: 2.6 mg/dL (ref 2.5–4.6)
Potassium: 4.3 mmol/L (ref 3.5–5.1)
Potassium: 4.7 mmol/L (ref 3.5–5.1)
Sodium: 138 mmol/L (ref 135–145)
Sodium: 137 mmol/L (ref 135–145)

## 2020-04-10 LAB — MAGNESIUM: Magnesium: 2.6 mg/dL — ABNORMAL HIGH (ref 1.7–2.4)

## 2020-04-10 LAB — CBC
HCT: 28 % — ABNORMAL LOW (ref 39.0–52.0)
Hemoglobin: 8.6 g/dL — ABNORMAL LOW (ref 13.0–17.0)
MCH: 28.3 pg (ref 26.0–34.0)
MCHC: 30.7 g/dL (ref 30.0–36.0)
MCV: 92.1 fL (ref 80.0–100.0)
Platelets: 136 10*3/uL — ABNORMAL LOW (ref 150–400)
RBC: 3.04 MIL/uL — ABNORMAL LOW (ref 4.22–5.81)
RDW: 15.9 % — ABNORMAL HIGH (ref 11.5–15.5)
WBC: 7.5 10*3/uL (ref 4.0–10.5)
nRBC: 0.4 % — ABNORMAL HIGH (ref 0.0–0.2)

## 2020-04-10 LAB — GLUCOSE, CAPILLARY
Glucose-Capillary: 105 mg/dL — ABNORMAL HIGH (ref 70–99)
Glucose-Capillary: 116 mg/dL — ABNORMAL HIGH (ref 70–99)
Glucose-Capillary: 125 mg/dL — ABNORMAL HIGH (ref 70–99)
Glucose-Capillary: 131 mg/dL — ABNORMAL HIGH (ref 70–99)
Glucose-Capillary: 137 mg/dL — ABNORMAL HIGH (ref 70–99)
Glucose-Capillary: 139 mg/dL — ABNORMAL HIGH (ref 70–99)
Glucose-Capillary: 143 mg/dL — ABNORMAL HIGH (ref 70–99)
Glucose-Capillary: 159 mg/dL — ABNORMAL HIGH (ref 70–99)
Glucose-Capillary: 67 mg/dL — ABNORMAL LOW (ref 70–99)

## 2020-04-10 LAB — PROTIME-INR
INR: 2.4 — ABNORMAL HIGH (ref 0.8–1.2)
Prothrombin Time: 24.9 seconds — ABNORMAL HIGH (ref 11.4–15.2)

## 2020-04-10 LAB — LACTATE DEHYDROGENASE: LDH: 243 U/L — ABNORMAL HIGH (ref 98–192)

## 2020-04-10 MED ORDER — MIDODRINE HCL 5 MG PO TABS
10.0000 mg | ORAL_TABLET | Freq: Three times a day (TID) | ORAL | Status: DC
  Administered 2020-04-10 – 2020-04-14 (×14): 10 mg
  Filled 2020-04-10 (×14): qty 2

## 2020-04-10 MED ORDER — WARFARIN SODIUM 1 MG PO TABS
1.0000 mg | ORAL_TABLET | Freq: Once | ORAL | Status: AC
  Administered 2020-04-10: 18:00:00 1 mg via ORAL
  Filled 2020-04-10: qty 1

## 2020-04-10 MED ORDER — SODIUM PHOSPHATES 45 MMOLE/15ML IV SOLN
10.0000 mmol | Freq: Once | INTRAVENOUS | Status: AC
  Administered 2020-04-10: 10 mmol via INTRAVENOUS
  Filled 2020-04-10: qty 3.33

## 2020-04-10 NOTE — Progress Notes (Signed)
Patient resting comfortably on 2L [place] with no respiratory distress noted. BIPAP not needed at this time. RT will monitor as needed. 

## 2020-04-10 NOTE — Progress Notes (Signed)
Hypoglycemic Event  CBG: 67 @ 0814  Treatment: dextrose 50% 12.5g   Symptoms: none  Follow-up CBG: Time: 0845 CBG Result: 105  Possible Reasons for Event: N/A  [name] [name]

## 2020-04-10 NOTE — Progress Notes (Signed)
Patient ID: [name] [name], male   DOB: December 09, 1951, 69 y.o.   MRN: [id number]    Advanced Heart Failure Rounding Note   Subjective:    - S/p HM3 VAD 4/8 w MAZE procedure + LAA clipping. - Extubated 4/9 - 4/12 LVAD speed increased to 5400 --> echo moderate-severe RV dysfunction, septum mildly left shifted, trivial pericardial effusion.  LVAD speed cut back to 5300.  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  - 4/20 developed hemothorax requiring emergent CT placement and eventual VATS - 4/21 EEG done given concerns for posturing and c/w moderate to severe diffuse encephalopathy. No seizures or epileptiform discharges were seen  - 04/03/20 Pump Stop --> controller change out. CT of head unchanged R MCA CVA - 04/06/20 Extubated  Much more awake today. On NE 1. Off milrinone. Remains on CVVHD keeping even. Says he is hungry and thirsty. Wants to get out of bed as well.     VAD Interrogation  Flow 3.7, Speed 5300, Power 4.0  Pulse Index 4.6   VAD interrogated personally. Parameters stable.   Objective:   Weight Range:  Vital Signs:   Temp:  [96.1 F (35.6 C)-98.5 F (36.9 C)] 96.7 F (35.9 C) (04/29 0700) Pulse Rate:  [67-165] 67 (04/29 0700) Resp:  [12-25] 12 (04/29 0700) BP: (78-131)/(57-99) 88/77 (04/29 0700) SpO2:  [93 %-100 %] 96 % (04/29 0700) Arterial Line BP: (79-122)/(56-79) 98/79 (04/29 0700) Weight:  [99 kg] 99 kg (04/29 0500) Last BM Date: 04/09/20  Weight change: Filed Weights   04/08/20 0600 04/09/20 0600 04/10/20 0500  Weight: 100 kg 99.6 kg 99 kg    Intake/Output:   Intake/Output Summary (Last 24 hours) at 04/10/2020 0901 Last data filed at 04/10/2020 0700 Gross per 24 hour  Intake 2422.6 ml  Output 2501 ml  Net -78.4 ml    Physical Exam: General:  NAD.  HEENT: normal  + cortrak Neck: supple. RIJ TLC. [facility] trialysis cath  Carotids 2+ bilat; no bruits. No lymphadenopathy or thryomegaly appreciated. Cor: LVAD hum.  Lungs: Clear. Dul left  base Abdomen: obese soft, nontender, non-distended. No hepatosplenomegaly. No bruits or masses. Good bowel sounds. Driveline site clean. Anchor in place.  Extremities: no cyanosis, clubbing, rash. Warm no edema  Neuro: alert & oriented x 3. No focal deficits. Some weakness on left side.   Telemetry: A fib 70-80s + PVCs Personally reviewed    Labs: Basic Metabolic Panel: Recent Labs  Lab 04/06/20 0256 04/06/20 0312 04/07/20 0311 04/07/20 0802 04/08/20 0350 04/08/20 0350 04/08/20 1632 04/08/20 1632 04/09/20 0438 04/09/20 1653 04/10/20 0413  NA 137   < > 136   < > 137  --  138  --  138 138 138  K 4.7   < > 4.5   < > 4.0  --  4.3  --  4.2 4.5 4.3  CL 104   < > 101   < > 101  --  103  --  105 104 102  CO2 25   < > 24   < > 25  --  26  --  21* 25 28  GLUCOSE 200*   < > 131*   < > 132*  --  103*  --  117* 123* 137*  BUN 32*   < > 34*   < > 32*  --  33*  --  32* 31* 31*  CREATININE 1.80*   < > 2.16*   < > 1.88*  --  1.91*  --  1.83* 1.84* 1.88*  CALCIUM 8.1*   < > 8.1*   < > 8.4*   < > 8.4*   < > 8.3* 8.6* 8.5*  MG 2.6*  --  2.5*  --  2.5*  --   --   --  2.6*  --  2.6*  PHOS 2.2*   < > 3.2   < > 2.6  --  2.7  --  2.3* 3.5 2.6   < > = values in this interval not displayed.    Liver Function Tests: Recent Labs  Lab 04/04/20 0332 04/04/20 1610 04/05/20 0323 04/05/20 1548 04/06/20 0256 04/06/20 1556 04/08/20 0350 04/08/20 1632 04/09/20 0438 04/09/20 1653 04/10/20 0413  AST 35  --  33  --  34  --   --   --   --   --   --   ALT 22  --  23  --  21  --   --   --   --   --   --   ALKPHOS 112  --  142*  --  129*  --   --   --   --   --   --   BILITOT 0.9  --  0.6  --  0.8  --   --   --   --   --   --   PROT 5.6*  --  5.9*  --  5.7*  --   --   --   --   --   --   ALBUMIN 2.2*  2.1*   < > 2.1*  2.2*   < > 2.1*  2.2*   < > 2.0* 2.2* 2.4* 2.4* 2.3*   < > = values in this interval not displayed.   No results for input(s): LIPASE, AMYLASE in the last 168 hours. No results for  input(s): AMMONIA in the last 168 hours.  CBC: Recent Labs  Lab 04/06/20 0256 04/06/20 0312 04/07/20 0311 04/07/20 0802 04/08/20 0350 04/09/20 0438 04/10/20 0413  WBC 8.8  --  14.3*  --  11.9* 8.0 7.5  HGB 8.4*   < > 8.8* 9.5* 8.9* 8.1* 8.6*  HCT 27.5*   < > 28.4* 28.0* 28.3* 25.7* 28.0*  MCV 92.3  --  91.3  --  92.5 90.8 92.1  PLT 131*  --  151  --  152 125* 136*   < > = values in this interval not displayed.    Cardiac Enzymes: No results for input(s): CKTOTAL, CKMB, CKMBINDEX, TROPONINI in the last 168 hours.  BNP: BNP (last 3 results) Recent Labs    03/26/20 2329 04/02/20 2342 04/10/20 0132  BNP 425.7* 332.7* 267.2*    ProBNP (last 3 results) No results for input(s): PROBNP in the last 8760 hours.    Other results:  Imaging: DG Chest 1 View  Result Date: 04/10/2020 CLINICAL DATA:  Shortness of breath.  Open heart surgery EXAM: CHEST  1 VIEW COMPARISON:  Two days ago FINDINGS: Postoperative heart with LVAD and left atrial clipping. Dual-chamber ICD leads from the left. Right-sided central lines in unremarkable position. The feeding tube at least reaches the stomach. Unchanged bilateral pulmonary opacity, more dense on the left there may be pleural fluid. No visible pneumothorax. IMPRESSION: Stable hardware positioning and left more than right opacification. Electronically Signed   By: Monte Fantasia M.D.   On: 04/10/2020 08:57     Medications:     Scheduled Medications: . sodium  chloride   Intravenous Once  . aspirin  81 mg Per Tube Daily  . atorvastatin  80 mg Per Tube q1800  . B-complex with vitamin C  1 tablet Per Tube Daily  . bisacodyl  10 mg Oral Daily   Or  . bisacodyl  10 mg Rectal Daily  . Chlorhexidine Gluconate Cloth  6 each Topical Daily  . docusate  200 mg Per Tube Daily  . feeding supplement (PRO-STAT SUGAR FREE 64)  30 mL Per Tube QID  . influenza vaccine adjuvanted  0.5 mL Intramuscular Tomorrow-1000  . insulin aspart  0-24 Units  Subcutaneous Q4H  . insulin aspart  6 Units Subcutaneous Q4H  . insulin glargine  24 Units Subcutaneous BID  . mouth rinse  15 mL Mouth Rinse BID  . melatonin  3 mg Per Tube QHS  . metoCLOPramide (REGLAN) injection  5 mg Intravenous Q8H  . midodrine  5 mg Per Tube TID WC  . pantoprazole (PROTONIX) IV  40 mg Intravenous Daily  . polyethylene glycol  17 g Per Tube Daily  . QUEtiapine  25 mg Per Tube QHS  . sildenafil  20 mg Per Tube TID  . sodium chloride flush  10-40 mL Intracatheter Q12H  . Warfarin - Pharmacist Dosing Inpatient   Does not apply q1600    Infusions: .  prismasol BGK 4/2.5 500 mL/hr at 04/09/20 2342  .  prismasol BGK 4/2.5 300 mL/hr at 04/08/20 2342  . sodium chloride Stopped (03/22/20 2011)  . sodium chloride 10 mL/hr at 04/10/20 0700  . sodium chloride    . sodium chloride 10 mL/hr at 04/05/20 1815  . sodium chloride    . amiodarone 30 mg/hr (04/10/20 0700)  . dexmedetomidine (PRECEDEX) IV infusion Stopped (04/07/20 1093)  . epinephrine Stopped (04/05/20 0854)  . feeding supplement (VITAL 1.5 CAL) 1,000 mL (04/10/20 0800)  . lactated ringers Stopped (03/29/20 0748)  . norepinephrine (LEVOPHED) Adult infusion 2 mcg/min (04/10/20 0700)  . prismasol BGK 4/2.5 1,500 mL/hr at 04/10/20 0253  . sodium phosphate  Dextrose 5% IVPB 10 mmol (04/10/20 0750)  . vasopressin (PITRESSIN) infusion - *FOR SHOCK* Stopped (04/07/20 0515)    PRN Medications: sodium chloride, acetaminophen, dextrose, fentaNYL (SUBLIMAZE) injection, heparin, heparin, hydrALAZINE, HYDROcodone-acetaminophen, levalbuterol, ondansetron (ZOFRAN) IV, ondansetron (ZOFRAN) IV, oxyCODONE, sodium chloride, sodium chloride flush, sorbitol, traMADol   Assessment/Plan:   1. Acute on chronic systolic HF -> cardiogenic shock-> S/p HM3 LVAD - Echo 2016 EF 30-35% - Echo 1/21 EF 25% - Admitted with NYHA IV symptoms and AKI with attempts at diuresis. Initial co-ox 39% - Echo this admit: EF 10-15% moderate RV  dysfunction - R/LHC cath 3/21 with severe 3v CAD and low output with CI 1.7.  - Initially planned for CABG but given need for re-do sternotomy, relatively poor targets and longstanding low EF, VAD felt to be better option  - S/p HM3 VAD 4/8 - Speed turned up on 4/11 to  5400, but looking at 4/10 echo there is significant RV dysfunction with leftward septal shift so speed decreased back to 5300 rpm.  - Off milrinone. On NE 1 for BP support. CO-ox 67% - Volume status looks good on CVVHD - Will try to wean NE off today and switch to iHD.   2. VAD - VAD interrogated personally. Parameters stable. - Had Pump Stop 04/03/20. Controller changed out with old controller sent back to [facility] for evaluation.  - on aspirin 81 mg daily + coumadin.  - LDH 243 -  INR  2.4  Discussed dosing with PharmD personally. - Driveline ok   3. CAD with unstable angina - s/p previous PCI. - cath 02/13/2020 with severe 3v CAD - now s/p VAD on 03/22/2020   - No s/s angina  4. AKI on CKD 3a - Nephrology consulted. Started CVVHD 4/13  - Renal US 4/5 unremarkable.  -  Hopefully renal function will recover but Renal team not optimistic - - Will try to wean NE off today and switch to iHD in near future  5. Permanent AF - now s/p MAZE + LAA Clipping 4/8   - Rate controlled - Can stop amio now that almost off pressors - On coumadin. INR 2.4  6. MVP s/p MV repair - On admit with recurrent severe MR on echo and with huge v-waves on PCWP tracing - s/p VAD  7. Hemothoax - s/p CT placement and VATs on 4/22 - CTs pulled 4/26 - stable  8. ID/leukocytosis - 4/11 procalcitonin 1.96  - Started empiric coverage for PNA with vancomycin/cefepime for 7 days. Stopped 4/17 - Given recent VATs and high-dose pressor requirement will cover with broad spectrum abx for now, linezolid and cefepime (completed course 4/28)  - Remains AF  9. Acute Hypoxic Respiratory Failure - Re-intubated 4/20 with hemothorax- - Extubated 4/25 -  Remains stable on [place]  10. Malnutrition  - Albumin 2.6  - On tube feeds.  - Speech following. Plan for  Repeat swallow study today   11. CVA/anoxic brain injury - 03/27/20 CT large posterior MCA ischemic infarct, possibly from atrial fibrillation.  INR was supratherapeutic when CVA found.  Stable LDH, do not think partial pump thrombosis is the culprit. - more alert today. Still with some L weakness and inattention - PT/OT/speech following.  - Speech to follow up today. - CIR consulted.    12. Anemia, post-op   Received 2u RBC 04/05/20 - Hgb stable 8.6  - Transfuse if hemoglobin < 7.5   13. Hyperkalemia -Resolved.  - Continue CVVHD  14. DM2, poorly controlled - hgbA1c 11.1% - insulin on hold due to hypoglycemia   CRITICAL CARE Performed by: Glori Bickers  Total critical care time: 35 minutes  Critical care time was exclusive of separately billable procedures and treating other patients.  Critical care was necessary to treat or prevent imminent or life-threatening deterioration.  Critical care was time spent personally by me (independent of midlevel providers or residents) on the following activities: development of treatment plan with patient and/or surrogate as well as nursing, discussions with consultants, evaluation of patient's response to treatment, examination of patient, obtaining history from patient or surrogate, ordering and performing treatments and interventions, ordering and review of laboratory studies, ordering and review of radiographic studies, pulse oximetry and re-evaluation of patient's condition.   Length of Stay: 15    Glori Bickers MD  04/10/2020, 9:01 AM  Advanced Heart Failure Team Pager [phone] (M-F; [place])  Please contact [facility] for night-coverage after hours (4p -7a ) and weekends on [email or web address]

## 2020-04-10 NOTE — Progress Notes (Signed)
LVAD Coordinator Rounding Note:  HM III LVAD implanted on 04/03/2020 with MAZE procedure + LAA clipping  by Dr Orvan Seen under destination therapy criteria due to advanced age. Primary Heart Failure Cardiologist is Dr. Haroldine Laws.   Pt working with PT, pt was able to stand for a few seconds today.  Pt remains on CVVH; nurse reports keeping even.  Vital signs: Temp: 96.3 HR: 91 - afib A line: 104/76 (86)  Cuff: 97/81 (88) O2 Sat: 97% on 3L Wt: 253.3>262.3>260.8>259.9>237.8>220>223>222.8>236.9>235>233.5>220.2>220.4>219.5>218.2 lbs   LVAD interrogation reveals:  Speed: 5300 Flow: 4.7 Power: 3.8w  PI: 3.4  Alarms: none  Events: none Hematocrit: 28  Fixed speed: 5300 Low speed limit: 5000  CRRT: keeping even  Drive Line: Existing VAD dressing removed and site care performed using sterile technique. Drive line exit site cleaned with Chlora prep applicators x 2, allowed to dry, and gauze dressing with silver strip re-applied. Exit site healing and unincorporated, the velour is fully implanted at exit site. Scant bloody drainage. No redness, tenderness, foul odor or rash noted. Drive line anchor secure. Dressing changes twice weekly (Monday / Thursday) dressing changes per nurse champion or VAD coordinator.  Next dressing change due 04/14/20.      Labs:  LDH trend: 360>513>482>467>453>454>474>428>415>422>349>305>279>283>273>259>243  INR trend: 1.3>6.2>9>4.4>6.2>2.2>2.4>2.2>2.0>2.2>2.3>3.4>2.4>1.8>2.8>3.1>2.4  CR: 3.57>3.98>3.18>2.44>2.41>2.0>2.1>1.96>3.29>4.3>3.6>2.53>2.25>2.16>1.88>1.83>1.88  Anticoagulation Plan: -INR Goal: 2.0-2.5 -ASA Dose: 81 mg   Respiratory: - extubated 4/9/2 - re-intubated 04/03/2020 s/p left hemothorax with cardiac arrest  Nitric Oxide: off 03/23/20  Blood Products:  Intraop: 6 FFP  DDAVP  420 cell saver  04/06/2020>>4 FFP 03/21/20>> 1 RBC 03/22/20>>1 RBC  Post-op: 03/16/2020>>3 PC's, 2 FFP  Intra-op: 03/14/2020>>2 PCs      2 FFP  04/05/20: 2 PRBCs  Cultures:   -03/25/20- blood cultures>>negative - 04/09/2020-blood cultures>>negative  Device: -St [name] -Therapies: VF 194 - therapy on  Drips: - Milrinone 0.125 mcg/kg/min >> off 04/09/20 - Levo 1 mcg/min - Amiodarone 30 mg/hr-off - Tube feed: 50 mL/hr   Adverse Events on VAD: >>03/25/20 CVVH Started >>03/23/2020 left hemothorax; cardiac arrest  Pt Education:  1. Began basic education with patient today. Showed patient battery and clips. Demonstrated placing battery in, and taking out, of clips. Patient return demonstrated with repeated verbal cues and assistance. Unsure of patient's ability to remember teaching.  2. Discussed with bedside RN regarding working with patient on batteries/clips and power changes if able.  3. No family at bedside.  Plan/Recommendations:  1. Call VAD coordinator for any equipment or drive line issues.  2. Dressing changes Monday / Thursday per nurse champion or VAD coordinator.   Tanda Rockers RN [place] Coordinator  Office: [phone]  24/7 Pager: [phone]

## 2020-04-10 NOTE — Progress Notes (Signed)
CSW met briefly at bedside. Patient had just started Speech therapy. ST informed CSW that patient was able to stand this morning and patient responded "it was difficult". Patient resting in bed and working with ST. CSW continues to follow for supportive needs throughout implant hospitalization. [name], [place], [place]

## 2020-04-10 NOTE — Progress Notes (Signed)
Admit: 02/26/2020 LOS: 20  63M dialysis dependent AKI on CRRT, acute CHF exacerbation s/p VAD, status post MV repair, AFib s/p MAZE and LAA clipping 4/8; R MCA CVA 4/15; s/p hemothorax and VATS 4/20  S: Seen and examined on CRRT.  Procedure supervised. Had 2.3 liters UF over 4/28 with CRRT as charted thus far.  Anuric.  Was kept even.  On levo at 3.   Review of systems:     Denies overt shortness of breath ("breathing is good") Denies n/v  On tube feeds Rectal tube  O: 04/28 0701 - 04/29 0700 In: 2486.4 [I.V.:918.4; NG/GT:1310; IV Piggyback:258] Out: 2524 [Stool:200]  Filed Weights   04/07/20 0615 04/08/20 0600 04/09/20 0600  Weight: 99.9 kg 100 kg 99.6 kg   Physical exam   General adult male in bed critically ill  HEENT normocephalic atraumatic  Lungs coarse breath sounds  Heart S1S2 on levo Abdomen soft nontender nondistended Extremities no pitting edema  Neuro - awakens to voice; answers questions  Access: right [place] nontunneled catheter     Recent Labs  Lab 04/09/20 0438 04/09/20 1653 04/10/20 0413  NA 138 138 138  K 4.2 4.5 4.3  CL 105 104 102  CO2 21* 25 28  GLUCOSE 117* 123* 137*  BUN 32* 31* 31*  CREATININE 1.83* 1.84* 1.88*  CALCIUM 8.3* 8.6* 8.5*  PHOS 2.3* 3.5 2.6   Recent Labs  Lab 04/08/20 0350 04/09/20 0438 04/10/20 0413  WBC 11.9* 8.0 7.5  HGB 8.9* 8.1* 8.6*  HCT 28.3* 25.7* 28.0*  MCV 92.5 90.8 92.1  PLT 152 125* 136*    Scheduled Meds: . sodium chloride   Intravenous Once  . aspirin  81 mg Per Tube Daily  . atorvastatin  80 mg Per Tube q1800  . B-complex with vitamin C  1 tablet Per Tube Daily  . bisacodyl  10 mg Oral Daily   Or  . bisacodyl  10 mg Rectal Daily  . Chlorhexidine Gluconate Cloth  6 each Topical Daily  . docusate  200 mg Per Tube Daily  . feeding supplement (PRO-STAT SUGAR FREE 64)  30 mL Per Tube QID  . influenza vaccine adjuvanted  0.5 mL Intramuscular Tomorrow-1000  . insulin aspart  0-24 Units Subcutaneous Q4H   . insulin aspart  6 Units Subcutaneous Q4H  . insulin glargine  24 Units Subcutaneous BID  . mouth rinse  15 mL Mouth Rinse BID  . melatonin  3 mg Per Tube QHS  . metoCLOPramide (REGLAN) injection  5 mg Intravenous Q8H  . midodrine  5 mg Per Tube TID WC  . pantoprazole (PROTONIX) IV  40 mg Intravenous Daily  . polyethylene glycol  17 g Per Tube Daily  . QUEtiapine  25 mg Per Tube QHS  . sildenafil  20 mg Per Tube TID  . sodium chloride flush  10-40 mL Intracatheter Q12H  . Warfarin - Pharmacist Dosing Inpatient   Does not apply q1600   Continuous Infusions: .  prismasol BGK 4/2.5 500 mL/hr at 04/09/20 2342  .  prismasol BGK 4/2.5 300 mL/hr at 04/08/20 2342  . sodium chloride Stopped (03/22/20 2011)  . sodium chloride 10 mL/hr at 04/10/20 0500  . sodium chloride    . sodium chloride 10 mL/hr at 04/05/20 1815  . sodium chloride    . amiodarone 30 mg/hr (04/10/20 0500)  . dexmedetomidine (PRECEDEX) IV infusion Stopped (04/07/20 5409)  . epinephrine Stopped (04/05/20 0854)  . feeding supplement (VITAL 1.5 CAL) 50 mL/hr at  04/09/20 2300  . lactated ringers Stopped (03/29/20 0748)  . norepinephrine (LEVOPHED) Adult infusion 3.5 mcg/min (04/10/20 0500)  . prismasol BGK 4/2.5 1,500 mL/hr at 04/10/20 0253  . vasopressin (PITRESSIN) infusion - *FOR SHOCK* Stopped (04/07/20 0515)   PRN Meds:.sodium chloride, acetaminophen, dextrose, fentaNYL (SUBLIMAZE) injection, heparin, heparin, hydrALAZINE, HYDROcodone-acetaminophen, levalbuterol, ondansetron (ZOFRAN) IV, ondansetron (ZOFRAN) IV, oxyCODONE, sodium chloride, sodium chloride flush, sorbitol, traMADol  ABG    Component Value Date/Time   PHART 7.383 04/07/2020 0802   PCO2ART 45.2 04/07/2020 0802   PO2ART 72 (L) 04/07/2020 0802   HCO3 27.3 04/07/2020 0802   TCO2 29 04/07/2020 0802   ACIDBASEDEF 2.0 03/31/2020 1405   O2SAT 66.9 04/10/2020 0424    A/P  1. Anuric Dialysis dependent AKI.  CRRT started 4/13.  on CRRT.  UF is currently  at keep even.  8** Please contact nephrology for any UF goal changes. Now on all 4K fluids - if K rises will transition post back to 0 K. For now will keep on CRRT while on pressors then assess transition to [facility] as needed and will discuss with CHF.  no circuit anticoagluation.  systemic anticoagulation on hold after hemothorax 2. Hypophosphatemia, replete prn for P < 2.5 (gentle phos today) 3. sCHF s/p LVAD 4/8 4. Hemothorax s/p VATS 03/15/2020.  5. AFib s/p MAZE and LAA clipping 4/8 6. DM2 - per primary team  7. R MCA ischemic CVA - noted  8. Cardiogenic Shock - on norepi  9. Anemia - normocytic - no acute indication for PRBC's.  Started ESA  - aranesp 40 mcg IV once on 4/27 (Tues)  Claudia Desanctis, MD 04/10/2020 5:55 AM

## 2020-04-10 NOTE — Progress Notes (Signed)
  Speech Language Pathology Treatment: Dysphagia  Patient Details Name: [name] [name] MRN: [id number] DOB: September 23, 1951 Today's Date: 04/10/2020 Time: 6195-0932 SLP Time Calculation (min) (ACUTE ONLY): 27 min  Assessment / Plan / Recommendation Clinical Impression  SLP checked in this after PT/OT session and attempted to wake pt with RN. He was unable to open eyes or respond after great session with therapy where he stood x 3. Returned this afternoon and eventually woke and maintained during session focusing on effortful swallows, airway protection. Pt repositioned, oral cavity cleaned and followed commands for throat clear on 2 out of 4 attempts but could not adduct vocal cords, establish subglottal pressure for cough.  Pt eager for ice chip, teaspoon/cup and puree presentations. Swallow initiation subjectively, appeared mildly late in response. Quality of voice remained clear and he cleared his throat after 2 ice chips trials however not seen with small cup sips or applesauce. He exhibits decreased endurance and will need continued therapy for increased swallow however a FEES would assist in determining if and what textures are safest for therapeutic trials. Plan to check on pt's alertness and ability for FEES tomorrow morning.     HPI HPI: Mr. Keenum is a 69 year old gentleman with a history of ischemic cardiomyopathy who presented for an LVAD implantation and left atrial appendage clipping with sternal reconstruction on 4/8, extubated 4/9. Started CRRT on 6/71  He had a complicated postop course including a right MCA stroke on 4/15. Evaluated by SLP 4/17, started on a diet.  He had a cardiac arrest on 4/20 requiring reintubation until 4/25. BSE repeated 4/26, with recommendation for NPO. His mental status has been improving slowly for several days.      SLP Plan  Continue with current plan of care       Recommendations  Diet recommendations: Other(comment)(ice chips) Medication  Administration: Via alternative means                Oral Care Recommendations: Oral care QID Follow up Recommendations: Skilled Nursing facility SLP Visit Diagnosis: Dysphagia, oropharyngeal phase (R13.12) Plan: Continue with current plan of care                      Houston Siren 04/10/2020, 2:55 PM  Orbie Pyo Colvin Caroli.Ed Risk analyst [phone] Office [phone]

## 2020-04-10 NOTE — Progress Notes (Signed)
Pt singing and alert throughout session. Stood x 3 from bed in egress position with +2 assist. Rolled for pericare, linen change, positioning with min assist. Pt combing hair with R hand and max assist. Pt oriented to month and situation. Fatigued at end of session. Progressing steadily.   04/10/20 1100  OT Visit Information  Last OT Received On 04/10/20  Assistance Needed +3 or more (RN has been 3rd)  PT/OT/SLP Co-Evaluation/Treatment Yes  Reason for Co-Treatment Complexity of the patient's impairments (multi-system involvement);For patient/therapist safety  OT goals addressed during session ADL's and self-care;Strengthening/ROM  History of Present Illness 69 yo male with history of CAD with prior stenting of the LAD, Diagonal and OM in an outside hospital, ischemic cardiomyopathy with ICD in place, chronic systolic CHF, MVP s/p mitral valve repair, permanent atrial fib on chronic anticoagulation, HTN, HLD, DM, depression admitted to [facility] with progressive weakness, fatigue, dyspnea and chest pressure. Troponin elevated at 0.06. He was transferred to [facility] for cardiac cath, showing severe 3 vessel disease. Plan for CABG but pt with AKI. Pt underwent multiple tooth extraction on 4/6 and LVAD placement on 4/8. Postop day 7 patient was noted to be agitated the CT head was obtained showing a large posterior right MCA infarct. Pt with arrest on 4/20 and reintubated, extubated 4/25. Pt on CRRT.  Precautions  Precautions Fall;Sternal  Precaution Comments LVAD, CRRT, cortrak, flexiseal  Pain Assessment  Pain Assessment Faces  Faces Pain Scale 6  Pain Location sacrum, sore chest with coughing  Pain Descriptors / Indicators Discomfort  Pain Intervention(s) Repositioned;Monitored during session  Cognition  Arousal/Alertness Awake/alert  Behavior During Therapy WFL for tasks assessed/performed  Overall Cognitive Status Impaired/Different from baseline  Area of Impairment  Orientation;Memory;Safety/judgement  Orientation Level Time (knew month)  Current Attention Level Focused;Sustained  Memory Decreased short-term memory;Decreased recall of precautions  Following Commands Follows one step commands inconsistently;Follows one step commands with increased time  Safety/Judgement Decreased awareness of deficits  Awareness Intellectual  Problem Solving Slow processing;Decreased initiation;Difficulty sequencing;Requires verbal cues  General Comments pt singing and working hard throughout session, aware of situation  ADL  Grooming Brushing hair;Sitting;Moderate assistance  Toileting- Clothing Manipulation and Hygiene Total assistance;Bed level  Functional mobility during ADLs +2 for safety/equipment;Moderate assistance (to stand)  General ADL Comments pt using R hand to comb hair, decrease thoroughness  Bed Mobility  Overal bed mobility Needs Assistance  Bed Mobility Rolling;Supine to Sit  Rolling Min assist  Supine to sit +2 for physical assistance;Mod assist (to raise trunk from bed with bed in chair position)  General bed mobility comments supine to and from sit utilized with bed egress positioning of bed, min assist to roll with cues for sequence and precautions. mod +2 to slide toward [facility]  Balance  Overall balance assessment Needs assistance  Sitting-balance support Feet supported  Sitting balance-Leahy Scale Poor  Sitting balance - Comments Pt moved into chair/egress position in the bed.  able to shift weight anteriorly and scoot to edge of surface with guarding  Postural control Posterior lean;Left lateral lean  Standing balance support Bilateral upper extremity supported  Standing balance-Leahy Scale Poor  Standing balance comment physical assist and verbal cues for upright posture  Transfers  Overall transfer level Needs assistance  Equipment used 2 person hand held assist  Transfers Sit to/from Stand  Sit to Stand Mod assist;+2 physical  assistance  General transfer comment from bed in egress position, assist to rise and steady, requires +2 mod assist x 2  and +2 max for third stand after fatigued  OT - End of Session  Equipment Utilized During Treatment Oxygen;Gait belt (2L)  Activity Tolerance Patient tolerated treatment well  Patient left in bed;with call bell/phone within reach;with nursing/sitter in room  Nurse Communication Other (comment);Mobility status (ok to leave off mitt)  OT Assessment/Plan  OT Plan Discharge plan remains appropriate  OT Visit Diagnosis Other abnormalities of gait and mobility (R26.89);Cognitive communication deficit (R41.841)  Symptoms and signs involving cognitive functions Cerebral infarction  OT Frequency (ACUTE ONLY) Min 2X/week  Follow Up Recommendations CIR;Supervision/Assistance - 24 hour  OT Equipment None recommended by OT  AM-PAC OT "6 Clicks" Daily Activity Outcome Measure (Version 2)  Help from another person eating meals? 1  Help from another person taking care of personal grooming? 1  Help from another person toileting, which includes using toliet, bedpan, or urinal? 1  Help from another person bathing (including washing, rinsing, drying)? 1  Help from another person to put on and taking off regular upper body clothing? 1  Help from another person to put on and taking off regular lower body clothing? 1  6 Click Score 6  OT Goal Progression  Progress towards OT goals Progressing toward goals  Acute Rehab OT Goals  Patient Stated Goal to eat a Big Mac  OT Goal Formulation With patient  Time For Goal Achievement 04/21/20  Potential to Achieve Goals Good  OT Time Calculation  OT Start Time (ACUTE ONLY) 0924  OT Stop Time (ACUTE ONLY) 1002  OT Time Calculation (min) 38 min  OT General Charges  $OT Visit 1 Visit  OT Treatments  $Therapeutic Activity 8-22 mins  Nestor Lewandowsky, OTR/L Acute Rehabilitation Services Pager: [phone] Office: [phone]

## 2020-04-10 NOTE — Progress Notes (Signed)
ANTICOAGULATION CONSULT NOTE  Pharmacy Consult for warfarin Indication: post op LVAD  / Aflutter  Allergies  Allergen Reactions  . Liraglutide Nausea And Vomiting  . Lisinopril Cough    Patient Measurements: Height: 6' (182.9 cm) Weight: 99 kg (218 lb 4.1 oz) IBW/kg (Calculated) : 77.6 Heparin Dosing Weight: 101.2 kg  Vital Signs: Temp: 97.9 F (36.6 C) (04/29 1200) Temp Source: Axillary (04/29 1200) BP: 98/56 (04/29 1200) Pulse Rate: 77 (04/29 1300)  Labs: Recent Labs    04/08/20 0350 04/08/20 1632 04/09/20 0438 04/09/20 1653 04/10/20 0413  HGB 8.9*  --  8.1*  --  8.6*  HCT 28.3*  --  25.7*  --  28.0*  PLT 152  --  125*  --  136*  LABPROT 29.8*  --  30.9*  --  24.9*  INR 2.8*  --  3.1*  --  2.4*  CREATININE 1.88*   < > 1.83* 1.84* 1.88*   < > = values in this interval not displayed.    Estimated Creatinine Clearance: 44.6 mL/min (A) (by C-G formula based on SCr of 1.88 mg/dL (H)).   Medical History: Past Medical History:  Diagnosis Date  . Abdominal pain 01/05/2020  . Abnormal nuclear stress test 01/14/2015  . Acute on chronic combined systolic and diastolic CHF (congestive heart failure) ([place])   . Acute respiratory failure with hypoxia ([place])   . AKI (acute kidney injury) ([place])   . Anxiety   . Arthritis   . Atrial fibrillation ([place]) 11/29/2016  . Atrial fibrillation with RVR ([place])   . Atrial fibrillation, chronic ([place]) 11/29/2016   Last Assessment & Plan:  Formatting of this note might be different from the original. -continue present medications with warfarin anti-coagulation Last Assessment & Plan:  Formatting of this note might be different from the original. -chronic -anti-coagulated -rate controlled  . Automatic implantable cardioverter-defibrillator in situ 08/18/2011   Formatting of this note might be different from the original. Last Assessment & Plan:  Now followed in ICD clinic. He has followup in January.  Last Assessment & Plan:  Formatting of this  note might be different from the original. -historical note  . Benign prostatic hyperplasia   . Bipolar I disorder with depression ([place]) 01/06/2011   Last Assessment & Plan:  -historical note in chart -continue home medications  Last Assessment & Plan:  Formatting of this note might be different from the original. -historical note in chart -continue home medications  . CAD (coronary artery disease)    a.  1993 s/p MI - [employer];  b. s/p BMS to LAD '00;  c. PTCA 2nd diagonal 2010;  d. 02/18/12 Cath: moderate nonobs dzs - med rx;  e.  01/2015 Cath: LM nl, LAD 40-60m ISR, 70-25m/d, d1 90p (3.0x16 Synergy DES), D2 50-60, LCX nl, OM1 50p, 87m (2.5x12 Synergy DES), RCA nl, EF 30-35%.  . Cardiogenic shock ([place]) 03/21/2020  . Chronic anticoagulation 09/22/2018   Last Assessment & Plan:  -continue warfarin for atrial fibrillation with pharmacy to manage  Last Assessment & Plan:  Formatting of this note might be different from the original. -continue warfarin for atrial fibrillation with pharmacy to manage  . Chronic combined systolic and diastolic CHF (congestive heart failure) ([place])    a. 12/2014 Echo: EF 30-35%, Gr2 DD, mod MR, sev dil LA.  Marland Kitchen Chronic congestive heart failure ([place]) 04/22/2015   Last Assessment & Plan:  Formatting of this note might be different from the original. -known EF 20-25% -continue  medical management -appears stable clinically  Last Assessment & Plan:  Formatting of this note might be different from the original. -noted with abnormal EF and chronic activity restrictions -medically manage  . Chronic systolic dysfunction of left ventricle 07/25/2013  . CKD (chronic kidney disease) 09/22/2018  . CKD (chronic kidney disease), stage III   . Coagulopathy ([place])   . COVID-19 04/17/2019   Last Assessment & Plan:  -Apparent scattered infiltrates on CT chest.  -SARS Co V positive -afebrile and not hypoxic at rest; does qualify for home o2 with sats 88% on room air with activity -home today on  self-quarantine x 14 days -finish azithromycin at home -ask Home Health to check on patient Last Assessment & Plan:  -has persistently positive test since initial verification 04/16/19 -contract/d  . Depression   . Diabetic retinopathy associated with type 2 diabetes mellitus ([place]) 01/30/2020  . Diarrhea 01/05/2020  . Dizziness 09/27/2016  . DM (diabetes mellitus), type 2 ([place]) 03/19/2013   Last Assessment & Plan:  Formatting of this note might be different from the original. -hold metformin -reduce insulin with accuchecks and SSI -diet consult -HbA1c  . DNR (do not resuscitate) discussion 03/18/2013  . ED (erectile dysfunction)   . Elevated troponin 09/22/2018   Last Assessment & Plan:  Formatting of this note might be different from the original. -trend -mild elevation not thought to be ischemic on admission from history  . GERD (gastroesophageal reflux disease)   . Hyperlipidemia   . Hypertension   . ICD (implantable cardiac defibrillator) battery depletion 03/18/2013  . IHD (ischemic heart disease) 01/08/2019   Formatting of this note might be different from the original. Last Assessment & Plan:  Minimal symptoms of angina. He is on Ranexa, isosorbide, and a beta blocker. We'll try to maximize his blood pressure therapy. I stressed the importance of regular daily aerobic exercise. He needs to follow a heart healthy/diabetic diet.  . Ischemic cardiomyopathy    a. s/p [employer] [name] ([employer]) ICD implanted in [place] 2007;  b. 12/2014 Echo: Ef 30-35%.  . Ischemic dilated cardiomyopathy ([place]) 04/03/2020  . LVAD (left ventricular assist device) present ([place]) 03/21/2020  . MVP (mitral valve prolapse)    a. s/p MV annuloplasty at [facility] 2004.  . Non-rheumatic mitral regurgitation 01/08/2019   Formatting of this note might be different from the original. Last Assessment & Plan:  He does have a murmur of mitral insufficiency by exam. We will followup an echocardiogram to assess the success of his mitral valve  repair.  . Other chest pain 06/01/2019   Last Assessment & Plan:  Formatting of this note might be different from the original. -known COVID19 with known CAD and cardiomyopathy -trend troponins -consider other etiologies like cardiac involvement with COVID19 -consult DR Gavin Potters -consider echo (although none available until 6/22) -await CT thorax result  . Palliative care by specialist   . Persistent atrial fibrillation ([place])    a. noted on ICD interrogation '10 - not previously on [place] - CHA2DS2VASc = 5.  . Pneumonia, unspecified organism 09/22/2018  . S/P coronary artery stent placement   . Severe episode of recurrent major depressive disorder, without psychotic features ([place]) 02/07/2020  . Shortness of breath   . Syncope 09/15/2012  . Tick bite 06/02/2019   Last Assessment & Plan:  -reported by ER staff; had tick on umbilicus (removed) -monitor for any signs requiring treatment (I.e. rash, fever)  Last Assessment & Plan:  Formatting of this note might be  different from the original. -reported by ER staff; had tick on umbilicus (removed) -monitor for any signs requiring treatment (I.e. rash, fever)  . TSH elevation   . Type II diabetes mellitus (HCC)    uncontrolled  . Unstable angina ([place]) 02/19/2012    Medications:  Medications Prior to Admission  Medication Sig Dispense Refill Last Dose  . ALPRAZolam (XANAX) 0.25 MG tablet Take 0.25 mg by mouth 3 (three) times daily as needed for anxiety.    unknown  . amiodarone (PACERONE) 200 MG tablet Take 200 mg by mouth daily.    03/11/2020 at Unknown time  . apixaban (ELIQUIS) 5 MG TABS tablet Take 1 tablet (5 mg total) by mouth 2 (two) times daily. 60 tablet 1 03/11/2020 at 0600  . atorvastatin (LIPITOR) 40 MG tablet Take 1 tablet (40 mg total) by mouth daily at 6 PM. 30 tablet 0 03/10/2020 at Unknown time  . citalopram (CELEXA) 20 MG tablet Take 20 mg by mouth daily.    03/11/2020 at Unknown time  . furosemide (LASIX) 40 MG tablet Take 40 mg by mouth  daily.    03/11/2020 at Unknown time  . insulin aspart (NOVOLOG FLEXPEN) 100 UNIT/ML FlexPen Inject 6 Units into the skin 3 (three) times daily with meals. 15 mL 0 03/11/2020 at Unknown time  . Insulin Glargine (BASAGLAR KWIKPEN) 100 UNIT/ML SOPN Inject 0.5 mLs (50 Units total) into the skin daily. 15 mL 1 03/11/2020 at Unknown time  . metoprolol succinate (TOPROL-XL) 100 MG 24 hr tablet Take 1 tablet (100 mg total) by mouth daily. 90 tablet 3 03/11/2020 at 0600  . nitroGLYCERIN (NITROSTAT) 0.4 MG SL tablet Place 1 tablet (0.4 mg total) under the tongue every 5 (five) minutes x 3 doses as needed for chest pain. 10 tablet 0 unknown  . pantoprazole (PROTONIX) 40 MG tablet Take 1 tablet (40 mg total) by mouth daily. 30 tablet 1 03/11/2020 at Unknown time  . potassium chloride SA (KLOR-CON) 20 MEQ tablet Take 20 mEq by mouth daily.    03/11/2020 at Unknown time  . tamsulosin (FLOMAX) 0.4 MG CAPS capsule Take 1 capsule (0.4 mg total) by mouth daily. 30 capsule 0 03/11/2020 at Unknown time  . ALPRAZolam (XANAX) 1 MG tablet Take 1 tablet (1 mg total) by mouth 2 (two) times daily as needed for anxiety. (Patient not taking: Reported on 03/01/2020) 10 tablet 0 Not Taking at Unknown time    Assessment: 58 YOM with atrial fibrillation on Eliquis PTA s/p R & L heart cath found to have 3v disease s/p  LVAD  Implant 4/8. Warfarin initiated on 4/9 after discussing with MD.  CT on 4/15 now found to have large right MCA stroke. INR had been elevated up to 9.0 postop, he received 1 dose of vitamin K 1 mg IV on 4/13 and again on 4/15 with INR still elevated at 6.2.   Warfarin resumed cautiously on 4/17. Held doses since 4/20 since he went to the OR for VATS. Warfarin was cautiously resumed on 4/23.  INR 2.4  is at goal. Hgb stable 8, Pltc stable 120. LDH stable 200s. No bleeding noted. Stop amio today - chronic Afib rate improved now weaning pressors - warfarin needs may increase  Goal of Therapy:  INR 2-2.5 Monitor  platelets by anticoagulation protocol: Yes   Plan:  Warfarin 1mg  x1 today -Daily INR and CBC   Bonnita Nasuti Pharm.D. CPP, BCPS Clinical Pharmacist [phone] 04/10/2020 4:51 PM    Please check [email or web address] for  unit-specific pharmacist phone numbers

## 2020-04-10 NOTE — Progress Notes (Signed)
Physical Therapy Treatment Patient Details Name: [name] [name] MRN: [id number] DOB: 25-Feb-1951 Today's Date: 04/10/2020    History of Present Illness 69 yo male with history of CAD with prior stenting of the LAD, Diagonal and OM in an outside hospital, ischemic cardiomyopathy with ICD in place, chronic systolic CHF, MVP s/p mitral valve repair, permanent atrial fib on chronic anticoagulation, HTN, HLD, DM, depression admitted to [facility] with progressive weakness, fatigue, dyspnea and chest pressure. Troponin elevated at 0.06. He was transferred to [facility] for cardiac cath, showing severe 3 vessel disease. Plan for CABG but pt with AKI. Pt underwent multiple tooth extraction on 4/6 and LVAD placement on 4/8. Postop day 7 patient was noted to be agitated the CT head was obtained showing a large posterior right MCA infarct. Pt with arrest on 4/20 and reintubated, extubated 4/25. Pt on CRRT.    PT Comments    Pt very pleasant, singing, aware of situation and place today. Pt following all single step commands and tolerating 3 standing trials up to 35 sec. Pt performing bil LE movement appropriately with some increased cueing to attend to LLE vs RLE. Pt eager to eat a Big Mac or ICEE. Pt aware of need for heart pillow to cough and to "have battery plugged in". Pt progressing well and will continue to follow. RN present throughout while on CRRT.     Follow Up Recommendations  CIR;Supervision/Assistance - 24 hour;LTACH     Equipment Recommendations  Other (comment)(TBD)    Recommendations for Other Services       Precautions / Restrictions Precautions Precautions: Fall;Sternal Precaution Comments: LVAD, CRRT, cortrak, flexiseal    Mobility  Bed Mobility Overal bed mobility: Needs Assistance Bed Mobility: Supine to Sit;Sit to Supine Rolling: Min assist         General bed mobility comments: supine to and from sit utilized with bed egress positioning of bed, min assist to roll  with cues for sequence and precautions. mod +2 to slide toward [facility]  Transfers Overall transfer level: Needs assistance     Sit to Stand: Mod assist;+2 physical assistance         General transfer comment: With bed in egress position, pt moved sit to stand with mod A +2 x 3 trials of 20, 35, and 15 sec respectively. Pt with physical assist and cues for trunk and hip extension. Pt with good stability with bil UE support  Ambulation/Gait             General Gait Details: unable   Stairs             Wheelchair Mobility    Modified Rankin (Stroke Patients Only) Modified Rankin (Stroke Patients Only) Pre-Morbid Rankin Score: No symptoms Modified Rankin: Severe disability     Balance Overall balance assessment: Needs assistance     Sitting balance - Comments: Pt moved into chair/egress position in the bed.  able to shift weight anteriorly and scoot to edge of surface with guarding   Standing balance support: Bilateral upper extremity supported Standing balance-Leahy Scale: Poor Standing balance comment: pt with flexed trunk but no significant lateral lean today in standing                            Cognition Arousal/Alertness: Awake/alert Behavior During Therapy: WFL for tasks assessed/performed Overall Cognitive Status: Impaired/Different from baseline Area of Impairment: Orientation;Memory;Safety/judgement  Orientation Level: Time   Memory: Decreased short-term memory;Decreased recall of precautions   Safety/Judgement: Decreased awareness of deficits     General Comments: pt 2 years off with orientation, aware of month, heart surgery and LVAD. Pt alert, appropriate, following commands and singing throughout session      Exercises General Exercises - Lower Extremity Short Arc Quad: AROM;Both;Seated;10 reps Hip ABduction/ADduction: 10 reps;Supine;5 reps;Both;AROM    General Comments        Pertinent Vitals/Pain  Pain Score: 4  Pain Location: sacrum, sore chest with coughing Pain Descriptors / Indicators: Discomfort Pain Intervention(s): Limited activity within patient's tolerance;Repositioned    Home Living                      Prior Function            PT Goals (current goals can now be found in the care plan section) Progress towards PT goals: Progressing toward goals    Frequency    Min 3X/week      PT Plan Current plan remains appropriate    Co-evaluation              AM-PAC PT "6 Clicks" Mobility   Outcome Measure  Help needed turning from your back to your side while in a flat bed without using bedrails?: A Little Help needed moving from lying on your back to sitting on the side of a flat bed without using bedrails?: Total Help needed moving to and from a bed to a chair (including a wheelchair)?: Total Help needed standing up from a chair using your arms (e.g., wheelchair or bedside chair)?: A Lot Help needed to walk in hospital room?: Total Help needed climbing 3-5 steps with a railing? : Total 6 Click Score: 9    End of Session Equipment Utilized During Treatment: Gait belt;Oxygen Activity Tolerance: Patient tolerated treatment well Patient left: in bed;with call bell/phone within reach;with nursing/sitter in room Nurse Communication: Mobility status;Precautions PT Visit Diagnosis: Muscle weakness (generalized) (M62.81);Other abnormalities of gait and mobility (R26.89);Other symptoms and signs involving the nervous system (R29.898)     Time: 7858-8502 PT Time Calculation (min) (ACUTE ONLY): 39 min  Charges:  $Therapeutic Activity: 23-37 mins                     Donnielle Addison P, PT Acute Rehabilitation Services Pager: [phone] Office: [place] 04/10/2020, 10:18 AM

## 2020-04-11 DIAGNOSIS — R570 Cardiogenic shock: Secondary | ICD-10-CM | POA: Diagnosis not present

## 2020-04-11 LAB — COOXEMETRY PANEL
Carboxyhemoglobin: 1.8 % — ABNORMAL HIGH (ref 0.5–1.5)
Methemoglobin: 0.4 % (ref 0.0–1.5)
O2 Saturation: 68.1 %
Total hemoglobin: 8.3 g/dL — ABNORMAL LOW (ref 12.0–16.0)

## 2020-04-11 LAB — RENAL FUNCTION PANEL
Albumin: 2.1 g/dL — ABNORMAL LOW (ref 3.5–5.0)
Albumin: 2.1 g/dL — ABNORMAL LOW (ref 3.5–5.0)
Anion gap: 10 (ref 5–15)
Anion gap: 8 (ref 5–15)
BUN: 37 mg/dL — ABNORMAL HIGH (ref 8–23)
BUN: 36 mg/dL — ABNORMAL HIGH (ref 8–23)
CO2: 25 mmol/L (ref 22–32)
CO2: 25 mmol/L (ref 22–32)
Calcium: 8.2 mg/dL — ABNORMAL LOW (ref 8.9–10.3)
Calcium: 8.2 mg/dL — ABNORMAL LOW (ref 8.9–10.3)
Chloride: 102 mmol/L (ref 98–111)
Chloride: 104 mmol/L (ref 98–111)
Creatinine, Ser: 2.3 mg/dL — ABNORMAL HIGH (ref 0.61–1.24)
Creatinine, Ser: 2.16 mg/dL — ABNORMAL HIGH (ref 0.61–1.24)
GFR calc Af Amer: 32 mL/min — ABNORMAL LOW (ref >60)
GFR calc Af Amer: 35 mL/min — ABNORMAL LOW (ref >60)
GFR calc non Af Amer: 28 mL/min — ABNORMAL LOW (ref >60)
GFR calc non Af Amer: 30 mL/min — ABNORMAL LOW (ref >60)
Glucose, Bld: 120 mg/dL — ABNORMAL HIGH (ref 70–99)
Glucose, Bld: 103 mg/dL — ABNORMAL HIGH (ref 70–99)
Phosphorus: 2.5 mg/dL (ref 2.5–4.6)
Phosphorus: 2.8 mg/dL (ref 2.5–4.6)
Potassium: 4.7 mmol/L (ref 3.5–5.1)
Potassium: 4.5 mmol/L (ref 3.5–5.1)
Sodium: 137 mmol/L (ref 135–145)
Sodium: 137 mmol/L (ref 135–145)

## 2020-04-11 LAB — GLUCOSE, CAPILLARY
Glucose-Capillary: 121 mg/dL — ABNORMAL HIGH (ref 70–99)
Glucose-Capillary: 157 mg/dL — ABNORMAL HIGH (ref 70–99)
Glucose-Capillary: 86 mg/dL (ref 70–99)
Glucose-Capillary: 100 mg/dL — ABNORMAL HIGH (ref 70–99)
Glucose-Capillary: 104 mg/dL — ABNORMAL HIGH (ref 70–99)
Glucose-Capillary: 121 mg/dL — ABNORMAL HIGH (ref 70–99)
Glucose-Capillary: 109 mg/dL — ABNORMAL HIGH (ref 70–99)

## 2020-04-11 LAB — CBC
HCT: 27 % — ABNORMAL LOW (ref 39.0–52.0)
Hemoglobin: 8.3 g/dL — ABNORMAL LOW (ref 13.0–17.0)
MCH: 28 pg (ref 26.0–34.0)
MCHC: 30.7 g/dL (ref 30.0–36.0)
MCV: 91.2 fL (ref 80.0–100.0)
Platelets: 137 10*3/uL — ABNORMAL LOW (ref 150–400)
RBC: 2.96 MIL/uL — ABNORMAL LOW (ref 4.22–5.81)
RDW: 15.9 % — ABNORMAL HIGH (ref 11.5–15.5)
WBC: 9.2 10*3/uL (ref 4.0–10.5)
nRBC: 0.3 % — ABNORMAL HIGH (ref 0.0–0.2)

## 2020-04-11 LAB — PROTIME-INR
INR: 2.3 — ABNORMAL HIGH (ref 0.8–1.2)
Prothrombin Time: 24.9 seconds — ABNORMAL HIGH (ref 11.4–15.2)

## 2020-04-11 LAB — LACTATE DEHYDROGENASE: LDH: 262 U/L — ABNORMAL HIGH (ref 98–192)

## 2020-04-11 LAB — MAGNESIUM: Magnesium: 2.4 mg/dL (ref 1.7–2.4)

## 2020-04-11 MED ORDER — SODIUM PHOSPHATES 45 MMOLE/15ML IV SOLN
10.0000 mmol | Freq: Once | INTRAVENOUS | Status: AC
  Administered 2020-04-11: 08:00:00 10 mmol via INTRAVENOUS
  Filled 2020-04-11: qty 3.33

## 2020-04-11 MED ORDER — WARFARIN SODIUM 2 MG PO TABS: 2.0000 mg | ORAL_TABLET | Freq: Once | ORAL | Status: DC

## 2020-04-11 MED ORDER — WARFARIN SODIUM 1 MG PO TABS
1.0000 mg | ORAL_TABLET | Freq: Once | ORAL | Status: AC
  Administered 2020-04-11: 13:00:00 1 mg via ORAL
  Filled 2020-04-11: qty 1

## 2020-04-11 NOTE — Progress Notes (Signed)
Admit: 02/15/2020 LOS: 71  65M dialysis dependent AKI on CRRT, acute CHF exacerbation s/p VAD, status post MV repair, AFib s/p MAZE and LAA clipping 4/8; R MCA CVA 4/15; s/p hemothorax and VATS 4/20  S: Seen and examined on CRRT.  Procedure supervised. Had 1.7 liters UF over 4/29 with CRRT as charted thus far.  Was kept even.  Was incontinent of large amount of urine yesterday (soaked pad and gown) per nursing.  Off of levo all night per nursing.  Note was off of CRRT for a couple of hours due to filter issue - changed and running well now.  Mentation is better per nursing as well.   Review of systems:     Denies overt shortness of breath; has been on 2.5 liters oxygen  Denies n/v  On tube feeds; getting ice chips  O: 04/29 0701 - 04/30 0700 In: 1628.5 [I.V.:365; NG/GT:1050; IV Piggyback:213.5] Out: 7253 [Stool:100]  Filed Weights   04/08/20 0600 04/09/20 0600 04/10/20 0500  Weight: 100 kg 99.6 kg 99 kg   Physical exam   General adult male in bed critically ill  HEENT normocephalic atraumatic  Lungs coarse breath sounds  Heart VAD off of levo Abdomen soft nontender nondistended Extremities trace to 1+ lower extremity edema  Neuro - awakens to voice; answers questions  Access: right [place] nontunneled catheter     Recent Labs  Lab 04/10/20 0413 04/10/20 1633 04/11/20 0414  NA 138 137 137  K 4.3 4.7 4.7  CL 102 104 102  CO2 28 24 25   GLUCOSE 137* 137* 120*  BUN 31* 32* 37*  CREATININE 1.88* 2.03* 2.30*  CALCIUM 8.5* 8.4* 8.2*  PHOS 2.6 2.6 2.5   Recent Labs  Lab 04/09/20 0438 04/10/20 0413 04/11/20 0414  WBC 8.0 7.5 9.2  HGB 8.1* 8.6* 8.3*  HCT 25.7* 28.0* 27.0*  MCV 90.8 92.1 91.2  PLT 125* 136* 137*    Scheduled Meds: . sodium chloride   Intravenous Once  . aspirin  81 mg Per Tube Daily  . atorvastatin  80 mg Per Tube q1800  . B-complex with vitamin C  1 tablet Per Tube Daily  . bisacodyl  10 mg Oral Daily   Or  . bisacodyl  10 mg Rectal Daily  .  Chlorhexidine Gluconate Cloth  6 each Topical Daily  . docusate  200 mg Per Tube Daily  . feeding supplement (PRO-STAT SUGAR FREE 64)  30 mL Per Tube QID  . influenza vaccine adjuvanted  0.5 mL Intramuscular Tomorrow-1000  . insulin aspart  0-24 Units Subcutaneous Q4H  . insulin aspart  6 Units Subcutaneous Q4H  . insulin glargine  24 Units Subcutaneous BID  . mouth rinse  15 mL Mouth Rinse BID  . melatonin  3 mg Per Tube QHS  . metoCLOPramide (REGLAN) injection  5 mg Intravenous Q8H  . midodrine  10 mg Per Tube TID WC  . pantoprazole (PROTONIX) IV  40 mg Intravenous Daily  . polyethylene glycol  17 g Per Tube Daily  . QUEtiapine  25 mg Per Tube QHS  . sodium chloride flush  10-40 mL Intracatheter Q12H  . Warfarin - Pharmacist Dosing Inpatient   Does not apply q1600   Continuous Infusions: .  prismasol BGK 4/2.5 500 mL/hr at 04/10/20 1902  .  prismasol BGK 4/2.5 300 mL/hr at 04/10/20 1902  . sodium chloride Stopped (03/22/20 2011)  . sodium chloride 10 mL/hr at 04/11/20 0500  . sodium chloride    .  sodium chloride 10 mL/hr at 04/05/20 1815  . sodium chloride    . dexmedetomidine (PRECEDEX) IV infusion Stopped (04/07/20 2820)  . epinephrine Stopped (04/05/20 0854)  . feeding supplement (VITAL 1.5 CAL) 50 mL/hr at 04/11/20 0500  . lactated ringers Stopped (03/29/20 0748)  . norepinephrine (LEVOPHED) Adult infusion Stopped (04/10/20 2048)  . prismasol BGK 4/2.5 1,500 mL/hr at 04/11/20 0328  . vasopressin (PITRESSIN) infusion - *FOR SHOCK* Stopped (04/07/20 0515)   PRN Meds:.sodium chloride, acetaminophen, dextrose, fentaNYL (SUBLIMAZE) injection, heparin, heparin, hydrALAZINE, HYDROcodone-acetaminophen, levalbuterol, ondansetron (ZOFRAN) IV, ondansetron (ZOFRAN) IV, oxyCODONE, sodium chloride, sodium chloride flush, sorbitol, traMADol  ABG    Component Value Date/Time   PHART 7.383 04/07/2020 0802   PCO2ART 45.2 04/07/2020 0802   PO2ART 72 (L) 04/07/2020 0802   HCO3 27.3  04/07/2020 0802   TCO2 29 04/07/2020 0802   ACIDBASEDEF 2.0 03/30/2020 1405   O2SAT 68.1 04/11/2020 0415    A/P  1. Dialysis dependent AKI.  CRRT started 4/13.  Continue on CRRT.  UF is currently at keep even.  Please contact nephrology for any UF goal changes.  Increase Dialysate from 1.5 to 1.8 liters.  Now on all 4K fluids - if K rises further will transition post back to 0 K. For now will keep on CRRT while on pressors then assess transition to [facility] as needed.  no circuit anticoagluation.  systemic anticoagulation on hold after hemothorax 2. Hypophosphatemia, replete prn for P < 2.5 (going ahead with gentle phos today) 3. sCHF s/p LVAD 4/8 4. Hemothorax s/p VATS 04/07/2020.  5. AFib s/p MAZE and LAA clipping 4/8 6. DM2 - per primary team  7. R MCA ischemic CVA - noted  8. Cardiogenic Shock - off of norepi.  Note on midodrine 9. Anemia - normocytic - no acute indication for PRBC's.  Started ESA  - aranesp 40 mcg IV once on 4/27 (Tues)    Claudia Desanctis, MD 04/11/2020 5:59 AM

## 2020-04-11 NOTE — Plan of Care (Signed)
  Problem: Cardiovascular: Goal: Ability to achieve and maintain adequate cardiovascular perfusion will improve Outcome: Progressing   Problem: Health Behavior/Discharge Planning: Goal: Ability to safely manage health-related needs after discharge will improve Outcome: Progressing   Problem: Education: Goal: Understanding of CV disease, CV risk reduction, and recovery process will improve Outcome: Progressing   Problem: Activity: Goal: Ability to return to baseline activity level will improve Outcome: Progressing   Problem: Cardiovascular: Goal: Ability to achieve and maintain adequate cardiovascular perfusion will improve Outcome: Progressing   Problem: Education: Goal: Knowledge of General Education information will improve Description: Including pain rating scale, medication(s)/side effects and non-pharmacologic comfort measures Outcome: Progressing   Problem: Health Behavior/Discharge Planning: Goal: Ability to manage health-related needs will improve Outcome: Progressing   Problem: Clinical Measurements: Goal: Ability to maintain clinical measurements within normal limits will improve Outcome: Progressing Goal: Will remain free from infection Outcome: Progressing Goal: Diagnostic test results will improve Outcome: Progressing Goal: Respiratory complications will improve Outcome: Progressing Goal: Cardiovascular complication will be avoided Outcome: Progressing   Problem: Activity: Goal: Risk for activity intolerance will decrease Outcome: Progressing   Problem: Nutrition: Goal: Adequate nutrition will be maintained Outcome: Progressing   Problem: Coping: Goal: Level of anxiety will decrease Outcome: Progressing   Problem: Elimination: Goal: Will not experience complications related to bowel motility Outcome: Progressing Goal: Will not experience complications related to urinary retention Outcome: Progressing   Problem: Safety: Goal: Ability to remain  free from injury will improve Outcome: Progressing   Problem: Skin Integrity: Goal: Risk for impaired skin integrity will decrease Outcome: Progressing   Problem: Education: Goal: Knowledge of the prescribed therapeutic regimen will improve Outcome: Progressing   Problem: Activity: Goal: Risk for activity intolerance will decrease Outcome: Progressing   Problem: Cardiac: Goal: Ability to maintain an adequate cardiac output will improve Outcome: Progressing   Problem: Coping: Goal: Level of anxiety will decrease Outcome: Progressing   Problem: Fluid Volume: Goal: Risk for excess fluid volume will decrease Outcome: Progressing   Problem: Clinical Measurements: Goal: Ability to maintain clinical measurements within normal limits will improve Outcome: Progressing Goal: Will remain free from infection Outcome: Progressing   Problem: Respiratory: Goal: Will regain and/or maintain adequate ventilation Outcome: Progressing   Problem: Education: Goal: Knowledge of secondary prevention will improve Outcome: Progressing Goal: Knowledge of patient specific risk factors addressed and post discharge goals established will improve Outcome: Progressing Goal: Individualized Educational Video(s) Outcome: Progressing   Problem: Activity: Goal: Ability to tolerate increased activity will improve Outcome: Progressing   Problem: Respiratory: Goal: Ability to maintain a clear airway and adequate ventilation will improve Outcome: Progressing   Problem: Role Relationship: Goal: Method of communication will improve Outcome: Progressing

## 2020-04-11 NOTE — Progress Notes (Addendum)
  Speech Language Pathology  Patient Details Name: [name] [name] MRN: [id number] DOB: [birth year] Today's Date: 04/11/2020 Time:  -     SLP's plan was to check on pt for FEES today- not to initiate po's but more for safety for therapeutic trials for therapy. He is significantly deconditioned; and would benefit from the weekend and has alternative means. Continue oral care and will check in first next week.              Houston Siren 04/11/2020, 7:57 AM   Orbie Pyo Colvin Caroli.Ed Risk analyst [phone] Office [phone]

## 2020-04-11 NOTE — Progress Notes (Signed)
Inpatient Rehabilitation-Admissions Coordinator   Met with pt bedside. He was on CRRT and wrapped up in blankets asleep. Pt did not want to awaken or converse at this time. AC will follow up Monday to attempt to engage in rehab conversation.   Will follow.   Raechel Ache, OTR/L  Rehab Admissions Coordinator  [phone] 04/11/2020 3:30 PM

## 2020-04-11 NOTE — Progress Notes (Signed)
Physical Therapy Treatment Patient Details Name: [name] [name] MRN: [id number] DOB: 1951-12-01 Today's Date: 04/11/2020    History of Present Illness 69 yo male with history of CAD with prior stenting of the LAD, Diagonal and OM in an outside hospital, ischemic cardiomyopathy with ICD in place, chronic systolic CHF, MVP s/p mitral valve repair, permanent atrial fib on chronic anticoagulation, HTN, HLD, DM, depression admitted to [facility] with progressive weakness, fatigue, dyspnea and chest pressure. Troponin elevated at 0.06. He was transferred to [facility] for cardiac cath, showing severe 3 vessel disease. Plan for CABG but pt with AKI. Pt underwent multiple tooth extraction on 4/6 and LVAD placement on 4/8. Postop day 7 patient was noted to be agitated the CT head was obtained showing a large posterior right MCA infarct. Pt with arrest on 4/20 and reintubated, extubated 4/25. Pt on CRRT.    PT Comments    [name] awake and focused on asking for pepsi and water this session. Pt able to stand 2 trials but with increased assist and decreased stance time from last session. Improved orientation today and following commands with increased left lean from last session. Pt with desire to move but fatigues quickly with mobility. Pt able to lie flat for grossly 1 min to stretch back with transition and bed mobility and encouraged to have nursing assist with each day for scapular retraction as well.   Pt on 2L with SpO2 95%    Follow Up Recommendations  CIR;Supervision/Assistance - 24 hour;LTACH     Equipment Recommendations       Recommendations for Other Services       Precautions / Restrictions Precautions Precautions: Fall;Sternal Precaution Comments: LVAD, CRRT, cortrak, flexiseal    Mobility  Bed Mobility Overal bed mobility: Needs Assistance Bed Mobility: Supine to Sit;Sit to Supine           General bed mobility comments: supine to and from sit utilized with bed egress  positioning of bed, min assist to roll with cues for sequence and precautions. max +2 to slide toward [facility]  Transfers Overall transfer level: Needs assistance   Transfers: Sit to/from Stand Sit to Stand: Mod assist;+2 physical assistance         General transfer comment: from bed in egress position, assist to rise and steady, requires +2 mod assist x 1 trial for standing of 27 sec  and +2 max for second trial standing 10sec  Ambulation/Gait             General Gait Details: unable   Stairs             Wheelchair Mobility    Modified Rankin (Stroke Patients Only)       Balance Overall balance assessment: Needs assistance Sitting-balance support: Feet supported Sitting balance-Leahy Scale: Poor Sitting balance - Comments: pt with left lean in sitting and unable to correct without assist or maintain. assisting with anterior shift but not correction to midline Postural control: Left lateral lean Standing balance support: Bilateral upper extremity supported Standing balance-Leahy Scale: Poor Standing balance comment: physical assist and verbal cues for upright posture, pt with flexed posture and left lean                            Cognition Arousal/Alertness: Awake/alert Behavior During Therapy: Flat affect Overall Cognitive Status: Impaired/Different from baseline Area of Impairment: Orientation;Memory;Safety/judgement  Current Attention Level: Sustained Memory: Decreased short-term memory;Decreased recall of precautions Following Commands: Follows one step commands inconsistently;Follows one step commands with increased time Safety/Judgement: Decreased awareness of deficits Awareness: Intellectual Problem Solving: Slow processing;Decreased initiation;Difficulty sequencing;Requires verbal cues General Comments: pt not as jovial or talkative as last session. Oriented to date, place and situation. Pt very focused on having  something to drink despite education for inability at this time      Exercises General Exercises - Lower Extremity Long Arc Quad: AROM;Seated;10 reps Hip Flexion/Marching: AAROM;Both;10 reps;Seated;AROM(AAROm LLE)    General Comments        Pertinent Vitals/Pain Pain Score: 6  Pain Location: chest and nose Pain Descriptors / Indicators: Discomfort;Sore Pain Intervention(s): Limited activity within patient's tolerance;Monitored during session;Repositioned    Home Living                      Prior Function            PT Goals (current goals can now be found in the care plan section) Progress towards PT goals: Progressing toward goals    Frequency    Min 3X/week      PT Plan Current plan remains appropriate    Co-evaluation              AM-PAC PT "6 Clicks" Mobility   Outcome Measure  Help needed turning from your back to your side while in a flat bed without using bedrails?: A Little Help needed moving from lying on your back to sitting on the side of a flat bed without using bedrails?: Total Help needed moving to and from a bed to a chair (including a wheelchair)?: Total Help needed standing up from a chair using your arms (e.g., wheelchair or bedside chair)?: A Lot Help needed to walk in hospital room?: Total Help needed climbing 3-5 steps with a railing? : Total 6 Click Score: 9    End of Session Equipment Utilized During Treatment: Gait belt;Oxygen Activity Tolerance: Patient tolerated treatment well Patient left: in bed;with call bell/phone within reach;with nursing/sitter in room Nurse Communication: Mobility status;Precautions PT Visit Diagnosis: Muscle weakness (generalized) (M62.81);Other abnormalities of gait and mobility (R26.89);Other symptoms and signs involving the nervous system (R29.898)     Time: 4680-3212 PT Time Calculation (min) (ACUTE ONLY): 29 min  Charges:  $Therapeutic Activity: 23-37 mins                     Jeovanny Cuadros  P, PT Acute Rehabilitation Services Pager: [phone] Office: [phone]    Sandy Salaam Vonetta Foulk 04/11/2020, 12:36 PM

## 2020-04-11 NOTE — Progress Notes (Addendum)
Patient ID: [name] [name], male   DOB: 1951-12-07, 69 y.o.   MRN: [id number]    Advanced Heart Failure Rounding Note   Subjective:    - S/p HM3 VAD 4/8 w MAZE procedure + LAA clipping. - Extubated 4/9 - 4/12 LVAD speed increased to 5400 --> echo moderate-severe RV dysfunction, septum mildly left shifted, trivial pericardial effusion.  LVAD speed cut back to 5300.  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  - 4/20 developed hemothorax requiring emergent CT placement and eventual VATS - 4/21 EEG done given concerns for posturing and c/w moderate to severe diffuse encephalopathy. No seizures or epileptiform discharges were seen  - 04/03/20 Pump Stop --> controller change out. CT of head unchanged R MCA CVA - 04/06/20 Extubated  Sitting up in bed, getting bathed. Able to move left side more. No events overnight. Off pressors. MAPs stable in the upper 70s. Remains on CVVHD, running even. CVP 5-7. Co-ox 68%.   VAD Interrogation  Flow 3.9, Speed 5300, Power 3.8  Pulse Index 4.9. No PI events   VAD interrogated personally. Parameters stable.   Objective:   Weight Range:  Vital Signs:   Temp:  [97.9 F (36.6 C)-99.1 F (37.3 C)] 98.8 F (37.1 C) (04/30 0800) Pulse Rate:  [51-300] 89 (04/30 0745) Resp:  [8-31] 15 (04/30 0800) BP: (78-103)/(56-82) 88/75 (04/30 0800) SpO2:  [90 %-100 %] 97 % (04/30 0745) Arterial Line BP: (73-111)/(59-83) 86/68 (04/30 0800) Last BM Date: 04/11/20  Weight change: Filed Weights   04/08/20 0600 04/09/20 0600 04/10/20 0500  Weight: 100 kg 99.6 kg 99 kg    Intake/Output:   Intake/Output Summary (Last 24 hours) at 04/11/2020 1101 Last data filed at 04/11/2020 1001 Gross per 24 hour  Intake 1586.43 ml  Output 1733 ml  Net -146.57 ml    PHYSICAL EXAM: CVP 5-7 General:  fatigue appearing. Sitting up in bed No respiratory difficulty HEENT: normal Neck: supple. no JVD. Carotids 2+ bilat; no bruits. No lymphadenopathy or thyromegaly  appreciated. Cor: + LVAD Hum  Lungs: clear, no wheezing  Abdomen: soft, nontender, nondistended. No hepatosplenomegaly. No bruits or masses. Good bowel sounds. Drive-line Site: anchor in place. Clean/dry Extremities: no cyanosis, clubbing, rash, edema Neuro: alert & oriented x 3, cranial nerves grossly intact. moves all 4 extremities w/o difficulty. Affect pleasant.   Telemetry: Afib 80s Personally reviewed    Labs: Basic Metabolic Panel: Recent Labs  Lab 04/07/20 0311 04/07/20 0802 04/08/20 0350 04/08/20 1632 04/09/20 0438 04/09/20 0438 04/09/20 1653 04/09/20 1653 04/10/20 0413 04/10/20 1633 04/11/20 0414  NA 136   < > 137   < > 138  --  138  --  138 137 137  K 4.5   < > 4.0   < > 4.2  --  4.5  --  4.3 4.7 4.7  CL 101   < > 101   < > 105  --  104  --  102 104 102  CO2 24   < > 25   < > 21*  --  25  --  28 24 25   GLUCOSE 131*   < > 132*   < > 117*  --  123*  --  137* 137* 120*  BUN 34*   < > 32*   < > 32*  --  31*  --  31* 32* 37*  CREATININE 2.16*   < > 1.88*   < > 1.83*  --  1.84*  --  1.88* 2.03* 2.30*  CALCIUM 8.1*   < > 8.4*   < > 8.3*   < > 8.6*   < > 8.5* 8.4* 8.2*  MG 2.5*  --  2.5*  --  2.6*  --   --   --  2.6*  --  2.4  PHOS 3.2   < > 2.6   < > 2.3*  --  3.5  --  2.6 2.6 2.5   < > = values in this interval not displayed.    Liver Function Tests: Recent Labs  Lab 04/05/20 0323 04/05/20 1548 04/06/20 0256 04/06/20 1556 04/09/20 0438 04/09/20 1653 04/10/20 0413 04/10/20 1633 04/11/20 0414  AST 33  --  34  --   --   --   --   --   --   ALT 23  --  21  --   --   --   --   --   --   ALKPHOS 142*  --  129*  --   --   --   --   --   --   BILITOT 0.6  --  0.8  --   --   --   --   --   --   PROT 5.9*  --  5.7*  --   --   --   --   --   --   ALBUMIN 2.1*  2.2*   < > 2.1*  2.2*   < > 2.4* 2.4* 2.3* 2.3* 2.1*   < > = values in this interval not displayed.   No results for input(s): LIPASE, AMYLASE in the last 168 hours. No results for input(s): AMMONIA in  the last 168 hours.  CBC: Recent Labs  Lab 04/07/20 0311 04/07/20 0311 04/07/20 0802 04/08/20 0350 04/09/20 0438 04/10/20 0413 04/11/20 0414  WBC 14.3*  --   --  11.9* 8.0 7.5 9.2  HGB 8.8*   < > 9.5* 8.9* 8.1* 8.6* 8.3*  HCT 28.4*   < > 28.0* 28.3* 25.7* 28.0* 27.0*  MCV 91.3  --   --  92.5 90.8 92.1 91.2  PLT 151  --   --  152 125* 136* 137*   < > = values in this interval not displayed.    Cardiac Enzymes: No results for input(s): CKTOTAL, CKMB, CKMBINDEX, TROPONINI in the last 168 hours.  BNP: BNP (last 3 results) Recent Labs    03/26/20 2329 04/02/20 2342 04/10/20 0132  BNP 425.7* 332.7* 267.2*    ProBNP (last 3 results) No results for input(s): PROBNP in the last 8760 hours.    Other results:  Imaging: DG Chest 1 View  Result Date: 04/10/2020 CLINICAL DATA:  Shortness of breath.  Open heart surgery EXAM: CHEST  1 VIEW COMPARISON:  Two days ago FINDINGS: Postoperative heart with LVAD and left atrial clipping. Dual-chamber ICD leads from the left. Right-sided central lines in unremarkable position. The feeding tube at least reaches the stomach. Unchanged bilateral pulmonary opacity, more dense on the left there may be pleural fluid. No visible pneumothorax. IMPRESSION: Stable hardware positioning and left more than right opacification. Electronically Signed   By: Monte Fantasia M.D.   On: 04/10/2020 08:57     Medications:     Scheduled Medications: . sodium chloride   Intravenous Once  . aspirin  81 mg Per Tube Daily  . atorvastatin  80 mg Per Tube q1800  . B-complex with vitamin C  1 tablet Per Tube Daily  .  bisacodyl  10 mg Oral Daily   Or  . bisacodyl  10 mg Rectal Daily  . Chlorhexidine Gluconate Cloth  6 each Topical Daily  . docusate  200 mg Per Tube Daily  . feeding supplement (PRO-STAT SUGAR FREE 64)  30 mL Per Tube QID  . influenza vaccine adjuvanted  0.5 mL Intramuscular Tomorrow-1000  . insulin aspart  0-24 Units Subcutaneous Q4H  .  insulin aspart  6 Units Subcutaneous Q4H  . insulin glargine  24 Units Subcutaneous BID  . mouth rinse  15 mL Mouth Rinse BID  . melatonin  3 mg Per Tube QHS  . metoCLOPramide (REGLAN) injection  5 mg Intravenous Q8H  . midodrine  10 mg Per Tube TID WC  . pantoprazole (PROTONIX) IV  40 mg Intravenous Daily  . polyethylene glycol  17 g Per Tube Daily  . QUEtiapine  25 mg Per Tube QHS  . sodium chloride flush  10-40 mL Intracatheter Q12H  . warfarin  1 mg Oral Once  . Warfarin - Pharmacist Dosing Inpatient   Does not apply q1600    Infusions: .  prismasol BGK 4/2.5 500 mL/hr at 04/11/20 3474  .  prismasol BGK 4/2.5 300 mL/hr at 04/10/20 1902  . sodium chloride Stopped (03/22/20 2011)  . sodium chloride Stopped (04/11/20 0800)  . sodium chloride    . sodium chloride 10 mL/hr at 04/05/20 1815  . sodium chloride    . dexmedetomidine (PRECEDEX) IV infusion Stopped (04/07/20 2595)  . epinephrine Stopped (04/05/20 0854)  . feeding supplement (VITAL 1.5 CAL) 50 mL/hr at 04/11/20 0700  . lactated ringers Stopped (03/29/20 0748)  . norepinephrine (LEVOPHED) Adult infusion Stopped (04/10/20 2048)  . prismasol BGK 4/2.5 1,800 mL/hr at 04/11/20 0947  . sodium phosphate  Dextrose 5% IVPB 42 mL/hr at 04/11/20 0900  . vasopressin (PITRESSIN) infusion - *FOR SHOCK* Stopped (04/07/20 0515)    PRN Medications: sodium chloride, acetaminophen, dextrose, fentaNYL (SUBLIMAZE) injection, heparin, heparin, hydrALAZINE, HYDROcodone-acetaminophen, levalbuterol, ondansetron (ZOFRAN) IV, ondansetron (ZOFRAN) IV, oxyCODONE, sodium chloride, sodium chloride flush, sorbitol, traMADol   Assessment/Plan:   1. Acute on chronic systolic HF -> cardiogenic shock-> S/p HM3 LVAD - Echo 2016 EF 30-35% - Echo 1/21 EF 25% - Admitted with NYHA IV symptoms and AKI with attempts at diuresis. Initial co-ox 39% - Echo this admit: EF 10-15% moderate RV dysfunction - R/LHC cath 3/21 with severe 3v CAD and low output with CI  1.7.  - Initially planned for CABG but given need for re-do sternotomy, relatively poor targets and longstanding low EF, VAD felt to be better option  - S/p HM3 VAD 4/8 - Speed turned up on 4/11 to  5400, but looking at 4/10 echo there is significant RV dysfunction with leftward septal shift so speed decreased back to 5300 rpm.  - Off milrinone. Off NE. MAPs upper 70s. CO-ox 68% - Volume status looks good on CVVHD. CVP 5-7 - Hopefully can switch to iHD soon now off pressors.   2. VAD - VAD interrogated personally. Parameters stable. - Had Pump Stop 04/03/20. Controller changed out with old controller sent back to [facility] for evaluation.  - on aspirin 81 mg daily + coumadin.  - LDH 262 - INR  2.3  Discussed dosing with PharmD personally. - Driveline ok   3. CAD with unstable angina - s/p previous PCI. - cath 02/26/2020 with severe 3v CAD - now s/p VAD on 03/31/2020   - No s/s angina  4. AKI on CKD  3a - Nephrology consulted. Started CVVHD 4/13  - Renal US 4/5 unremarkable.  -  Hopefully renal function will recover but Renal team not optimistic - He is now off NE w/ stable MAPs. Hopefully can switch to iHD in near future  5. Permanent AF - now s/p MAZE + LAA Clipping 4/8   - Rate controlled - On coumadin. INR 2.3  6. MVP s/p MV repair - On admit with recurrent severe MR on echo and with huge v-waves on PCWP tracing - s/p VAD  7. Hemothoax - s/p CT placement and VATs on 4/22 - CTs pulled 4/26 - stable  8. ID/leukocytosis - 4/11 procalcitonin 1.96  - Started empiric coverage for PNA with vancomycin/cefepime for 7 days. Stopped 4/17 - Given recent VATs and high-dose pressor requirements he was treated with broad spectrum abx, linezolid and cefepime (completed course 4/28)  - Remains AF  9. Acute Hypoxic Respiratory Failure - Re-intubated 4/20 with hemothorax- - Extubated 4/25 - Remains stable on Kay[place]  10. Malnutrition  - Albumin 2.6  - On tube feeds.  - Speech following.     11. CVA/anoxic brain injury - 03/27/20 CT large posterior MCA ischemic infarct, possibly from atrial fibrillation.  INR was supratherapeutic when CVA found.  Stable LDH, do not think partial pump thrombosis is the culprit. - more alert today. Still with some L weakness and inattention but  Improving  - PT/OT/speech following.  - Speech to follow up today. - CIR consulted.    12. Anemia, post-op   Received 2u RBC 04/05/20 - Hgb stable 8.3  - Transfuse if hemoglobin < 7.5   13. Hyperkalemia - Resolved.  - Continue CVVHD  14. DM2, poorly controlled - hgbA1c 11.1% - continue insulin   Length of Stay: [place] PA-C  04/11/2020, 11:01 AM  Advanced Heart Failure Team Pager [phone] (M-F; [place])  Please contact [facility] for night-coverage after hours (4p -7a ) and weekends on [email or web address]  Patient seen and examined with the above-signed Advanced Practice Provider and/or Housestaff. I personally reviewed laboratory data, imaging studies and relevant notes. I independently examined the patient and formulated the important aspects of the plan. I have edited the note to reflect any of my changes or salient points. I have personally discussed the plan with the patient and/or family.  Remains on CVVHD but off pressors. Co-ox 68%. Keeping even.  Anuric. More alert. Volume status looks good. Unable to perform swallow study yesterday due to fatigue. Will try again today. Remains in chronic, rate-controlled AF.   VAD interrogated personally. Parameters stable. INR 2.3  General:  Lying in bed NAD HEENT: normal  Neck: supple. JVP not elevated. RIJ TLC   Carotids 2+ bilat; no bruits. No lymphadenopathy or thryomegaly appreciated. Cor: LVAD hum. [facility] trialysis Lungs: Clear. Abdomen: obese soft, nontender, non-distended. No hepatosplenomegaly. No bruits or masses. Good bowel sounds. Driveline site clean. Anchor in place.  Extremities: no cyanosis, clubbing, rash. Warm no edema   Neuro: alert & oriented x 3. No focal deficits. Mildly weak L-sided  Progressing slowly. Now of pressors. MAPs stable on midodrine. On CVVHD. Volume status ok  I spoke to Renal yesterday and plan will be to stop CVVHD soon and then try to switch to iHD as tolerated. I am still hopeful for some renal recovery.   INR 2.3 Will need to d/w Renal/Vascular if we will need to hold warfarin for mor permanent catheter.   VAD interrogated personally. Parameters  stable.   Continue PT/OT. Will need to stay in ICU until we are sure he can come off CVVHD.   CRITICAL CARE Performed by: Glori Bickers  Total critical care time: 35 minutes  Critical care time was exclusive of separately billable procedures and treating other patients.  Critical care was necessary to treat or prevent imminent or life-threatening deterioration.  Critical care was time spent personally by me (independent of midlevel providers or residents) on the following activities: development of treatment plan with patient and/or surrogate as well as nursing, discussions with consultants, evaluation of patient's response to treatment, examination of patient, obtaining history from patient or surrogate, ordering and performing treatments and interventions, ordering and review of laboratory studies, ordering and review of radiographic studies, pulse oximetry and re-evaluation of patient's condition.    Glori Bickers, MD  12:24 PM

## 2020-04-11 NOTE — Progress Notes (Signed)
ANTICOAGULATION CONSULT NOTE  Pharmacy Consult for warfarin Indication: post op LVAD  / Aflutter  Allergies  Allergen Reactions  . Liraglutide Nausea And Vomiting  . Lisinopril Cough    Patient Measurements: Height: 6' (182.9 cm) Weight: 99 kg (218 lb 4.1 oz) IBW/kg (Calculated) : 77.6 Heparin Dosing Weight: 101.2 kg  Vital Signs: Temp: 98.8 F (37.1 C) (04/30 0800) Temp Source: Oral (04/30 0800) BP: 88/75 (04/30 0800) Pulse Rate: 89 (04/30 0745)  Labs: Recent Labs    04/09/20 0438 04/09/20 1653 04/10/20 0413 04/10/20 1633 04/11/20 0414  HGB 8.1*  --  8.6*  --  8.3*  HCT 25.7*  --  28.0*  --  27.0*  PLT 125*  --  136*  --  137*  LABPROT 30.9*  --  24.9*  --  24.9*  INR 3.1*  --  2.4*  --  2.3*  CREATININE 1.83*   < > 1.88* 2.03* 2.30*   < > = values in this interval not displayed.    Estimated Creatinine Clearance: 36.4 mL/min (A) (by C-G formula based on SCr of 2.3 mg/dL (H)).   Medical History: Past Medical History:  Diagnosis Date  . Abdominal pain 01/05/2020  . Abnormal nuclear stress test 01/14/2015  . Acute on chronic combined systolic and diastolic CHF (congestive heart failure) ([place])   . Acute respiratory failure with hypoxia ([place])   . AKI (acute kidney injury) ([place])   . Anxiety   . Arthritis   . Atrial fibrillation ([place]) 11/29/2016  . Atrial fibrillation with RVR ([place])   . Atrial fibrillation, chronic ([place]) 11/29/2016   Last Assessment & Plan:  Formatting of this note might be different from the original. -continue present medications with warfarin anti-coagulation Last Assessment & Plan:  Formatting of this note might be different from the original. -chronic -anti-coagulated -rate controlled  . Automatic implantable cardioverter-defibrillator in situ 08/18/2011   Formatting of this note might be different from the original. Last Assessment & Plan:  Now followed in ICD clinic. He has followup in January.  Last Assessment & Plan:  Formatting of this note  might be different from the original. -historical note  . Benign prostatic hyperplasia   . Bipolar I disorder with depression ([place]) 01/06/2011   Last Assessment & Plan:  -historical note in chart -continue home medications  Last Assessment & Plan:  Formatting of this note might be different from the original. -historical note in chart -continue home medications  . CAD (coronary artery disease)    a.  1993 s/p MI - [employer];  b. s/p BMS to LAD '00;  c. PTCA 2nd diagonal 2010;  d. 02/18/12 Cath: moderate nonobs dzs - med rx;  e.  01/2015 Cath: LM nl, LAD 40-77m ISR, 70-79m/d, d1 90p (3.0x16 Synergy DES), D2 50-60, LCX nl, OM1 50p, 60m (2.5x12 Synergy DES), RCA nl, EF 30-35%.  . Cardiogenic shock ([place]) 03/21/2020  . Chronic anticoagulation 09/22/2018   Last Assessment & Plan:  -continue warfarin for atrial fibrillation with pharmacy to manage  Last Assessment & Plan:  Formatting of this note might be different from the original. -continue warfarin for atrial fibrillation with pharmacy to manage  . Chronic combined systolic and diastolic CHF (congestive heart failure) ([place])    a. 12/2014 Echo: EF 30-35%, Gr2 DD, mod MR, sev dil LA.  Marland Kitchen Chronic congestive heart failure ([place]) 04/22/2015   Last Assessment & Plan:  Formatting of this note might be different from the original. -known EF 20-25% -continue  medical management -appears stable clinically  Last Assessment & Plan:  Formatting of this note might be different from the original. -noted with abnormal EF and chronic activity restrictions -medically manage  . Chronic systolic dysfunction of left ventricle 07/25/2013  . CKD (chronic kidney disease) 09/22/2018  . CKD (chronic kidney disease), stage III   . Coagulopathy ([place])   . COVID-19 04/17/2019   Last Assessment & Plan:  -Apparent scattered infiltrates on CT chest.  -SARS Co V positive -afebrile and not hypoxic at rest; does qualify for home o2 with sats 88% on room air with activity -home today on  self-quarantine x 14 days -finish azithromycin at home -ask Home Health to check on patient Last Assessment & Plan:  -has persistently positive test since initial verification 04/16/19 -contract/d  . Depression   . Diabetic retinopathy associated with type 2 diabetes mellitus ([place]) 01/30/2020  . Diarrhea 01/05/2020  . Dizziness 09/27/2016  . DM (diabetes mellitus), type 2 ([place]) 03/19/2013   Last Assessment & Plan:  Formatting of this note might be different from the original. -hold metformin -reduce insulin with accuchecks and SSI -diet consult -HbA1c  . DNR (do not resuscitate) discussion 03/18/2013  . ED (erectile dysfunction)   . Elevated troponin 09/22/2018   Last Assessment & Plan:  Formatting of this note might be different from the original. -trend -mild elevation not thought to be ischemic on admission from history  . GERD (gastroesophageal reflux disease)   . Hyperlipidemia   . Hypertension   . ICD (implantable cardiac defibrillator) battery depletion 03/18/2013  . IHD (ischemic heart disease) 01/08/2019   Formatting of this note might be different from the original. Last Assessment & Plan:  Minimal symptoms of angina. He is on Ranexa, isosorbide, and a beta blocker. We'll try to maximize his blood pressure therapy. I stressed the importance of regular daily aerobic exercise. He needs to follow a heart healthy/diabetic diet.  . Ischemic cardiomyopathy    a. s/p [employer] [name] (Atlas) ICD implanted in [place] 2007;  b. 12/2014 Echo: Ef 30-35%.  . Ischemic dilated cardiomyopathy ([place]) 03/26/2020  . LVAD (left ventricular assist device) present ([place]) 03/21/2020  . MVP (mitral valve prolapse)    a. s/p MV annuloplasty at [facility] 2004.  . Non-rheumatic mitral regurgitation 01/08/2019   Formatting of this note might be different from the original. Last Assessment & Plan:  He does have a murmur of mitral insufficiency by exam. We will followup an echocardiogram to assess the success of his mitral valve  repair.  . Other chest pain 06/01/2019   Last Assessment & Plan:  Formatting of this note might be different from the original. -known COVID19 with known CAD and cardiomyopathy -trend troponins -consider other etiologies like cardiac involvement with COVID19 -consult DR Gavin Potters -consider echo (although none available until 6/22) -await CT thorax result  . Palliative care by specialist   . Persistent atrial fibrillation ([place])    a. noted on ICD interrogation '10 - not previously on [place] - CHA2DS2VASc = 5.  . Pneumonia, unspecified organism 09/22/2018  . S/P coronary artery stent placement   . Severe episode of recurrent major depressive disorder, without psychotic features ([place]) 02/07/2020  . Shortness of breath   . Syncope 09/15/2012  . Tick bite 06/02/2019   Last Assessment & Plan:  -reported by ER staff; had tick on umbilicus (removed) -monitor for any signs requiring treatment (I.e. rash, fever)  Last Assessment & Plan:  Formatting of this note might be  different from the original. -reported by ER staff; had tick on umbilicus (removed) -monitor for any signs requiring treatment (I.e. rash, fever)  . TSH elevation   . Type II diabetes mellitus (H[facility])    uncontrolled  . Unstable angina ([place]) 02/19/2012    Medications:  Medications Prior to Admission  Medication Sig Dispense Refill Last Dose  . ALPRAZolam (XANAX) 0.25 MG tablet Take 0.25 mg by mouth 3 (three) times daily as needed for anxiety.    unknown  . amiodarone (PACERONE) 200 MG tablet Take 200 mg by mouth daily.    03/11/2020 at Unknown time  . apixaban (ELIQUIS) 5 MG TABS tablet Take 1 tablet (5 mg total) by mouth 2 (two) times daily. 60 tablet 1 03/11/2020 at 0600  . atorvastatin (LIPITOR) 40 MG tablet Take 1 tablet (40 mg total) by mouth daily at 6 PM. 30 tablet 0 03/10/2020 at Unknown time  . citalopram (CELEXA) 20 MG tablet Take 20 mg by mouth daily.    03/11/2020 at Unknown time  . furosemide (LASIX) 40 MG tablet Take 40 mg by mouth  daily.    03/11/2020 at Unknown time  . insulin aspart (NOVOLOG FLEXPEN) 100 UNIT/ML FlexPen Inject 6 Units into the skin 3 (three) times daily with meals. 15 mL 0 03/11/2020 at Unknown time  . Insulin Glargine (BASAGLAR KWIKPEN) 100 UNIT/ML SOPN Inject 0.5 mLs (50 Units total) into the skin daily. 15 mL 1 03/11/2020 at Unknown time  . metoprolol succinate (TOPROL-XL) 100 MG 24 hr tablet Take 1 tablet (100 mg total) by mouth daily. 90 tablet 3 03/11/2020 at 0600  . nitroGLYCERIN (NITROSTAT) 0.4 MG SL tablet Place 1 tablet (0.4 mg total) under the tongue every 5 (five) minutes x 3 doses as needed for chest pain. 10 tablet 0 unknown  . pantoprazole (PROTONIX) 40 MG tablet Take 1 tablet (40 mg total) by mouth daily. 30 tablet 1 03/11/2020 at Unknown time  . potassium chloride SA (KLOR-CON) 20 MEQ tablet Take 20 mEq by mouth daily.    03/11/2020 at Unknown time  . tamsulosin (FLOMAX) 0.4 MG CAPS capsule Take 1 capsule (0.4 mg total) by mouth daily. 30 capsule 0 03/11/2020 at Unknown time  . ALPRAZolam (XANAX) 1 MG tablet Take 1 tablet (1 mg total) by mouth 2 (two) times daily as needed for anxiety. (Patient not taking: Reported on 02/13/2020) 10 tablet 0 Not Taking at Unknown time    Assessment: 18 YOM with atrial fibrillation on Eliquis PTA s/p R & L heart cath found to have 3v disease s/p  LVAD  Implant 4/8. Warfarin initiated on 4/9 after discussing with MD.  CT on 4/15 now found to have large right MCA stroke. INR had been elevated up to 9.0 postop, he received 1 dose of vitamin K 1 mg IV on 4/13 and again on 4/15 with INR still elevated at 6.2.   Warfarin resumed cautiously on 4/17. Held doses since 4/20 since he went to the OR for VATS. Warfarin was cautiously resumed on 4/23.  INR 2.3  is at goal. Hgb stable 8, Pltc stable 120. LDH stable 200s. No bleeding noted. Stop amio 4/29 - chronic Afib rate improved now weaning pressors - warfarin needs may increase  Goal of Therapy:  INR 2-2.5 Monitor  platelets by anticoagulation protocol: Yes   Plan:  Warfarin 1mg  x1 today repeat but may need a little boost this weekend now off amiodarone -Daily INR and CBC   Bonnita Nasuti Pharm.D. CPP, BCPS Clinical  Pharmacist [phone] 04/11/2020 10:13 AM    Please check [email or web address] for unit-specific pharmacist phone numbers

## 2020-04-11 NOTE — Progress Notes (Addendum)
LVAD Coordinator Rounding Note:  HM III LVAD implanted on 03/28/2020 with MAZE procedure + LAA clipping  by Dr Orvan Seen under destination therapy criteria due to advanced age. Primary Heart Failure Cardiologist is Dr. Haroldine Laws.   Pt asleep in bed. Plan to work with PT later today. Voided x 2 yesterday. No urine output today; condom cath in place.   Pt remains on CVVH; nurse reports keeping even.  Vital signs: Temp: 97.6 HR: 84 - afib A line: 88/72 (89)  Cuff: 88/75 (82) O2 Sat: 96% on 2L Wt: 253.3>262.3>260.8>259.9>237.8>220>223>222.8>236.9>235>233.5>220.2>220.4>219.5>218.2 lbs   LVAD interrogation reveals:  Speed: 5300 Flow: 4.0 Power: 3.7w  PI: 4.6  Alarms: none  Events: none Hematocrit: 28  Fixed speed: 5300 Low speed limit: 5000  CRRT: keeping even  Drive Line: Existing VAD dressing clean, dry, intact. Drive line anchor secure. Dressing changes twice weekly (Monday / Thursday) dressing changes per nurse champion or VAD coordinator.  Next dressing change due 04/14/20.      Labs:  LDH trend: 360>513>482>467>453>454>474>428>415>422>349>305>279>283>273>259>243>262  INR trend: 1.3>6.2>9>4.4>6.2>2.2>2.4>2.2>2.0>2.2>2.3>3.4>2.4>1.8>2.8>3.1>2.4>2.3  CR: 3.57>3.98>3.18>2.44>2.41>2.0>2.1>1.96>3.29>4.3>3.6>2.53>2.25>2.16>1.88>1.83>1.88>2.3  Anticoagulation Plan: -INR Goal: 2.0-2.5 -ASA Dose: 81 mg   Respiratory: - extubated 4/9/2 - re-intubated 04/02/2020 s/p left hemothorax with cardiac arrest  Nitric Oxide: off 03/23/20  Blood Products:  Intraop: 6 FFP  DDAVP  420 cell saver  03/13/2020>>4 FFP 03/21/20>> 1 RBC 03/22/20>>1 RBC  Post-op: 03/31/2020>>3 PC's, 2 FFP  Intra-op: 03/30/2020>>2 PCs      2 FFP  04/05/20: 2 PRBCs  Cultures:  -03/25/20- blood cultures>>negative - 04/08/2020-blood cultures>>negative  Device: -St [name] -Therapies: VF 194 - therapy on  Drips: - Milrinone 0.125 mcg/kg/min >> off 04/09/20 - Levo 1 mcg/min>> off 04/10/20 - Amiodarone 30 mg/hr-off - Tube  feed: 50 mL/hr   Adverse Events on VAD: >>03/25/20 CVVH Started >>03/28/2020 left hemothorax; cardiac arrest  Pt Education:  1. Pt not appropriate for teaching at this time.  2. Discussed with bedside RN regarding working with patient on batteries/clips and power changes if able.  3. No family at bedside.  Plan/Recommendations:  1. Call VAD coordinator for any equipment or drive line issues.  2. Dressing changes Monday / Thursday per nurse champion or VAD coordinator.   Emerson Monte RN [place] Coordinator  Office: [phone]  24/7 Pager: [phone]

## 2020-04-12 DIAGNOSIS — R570 Cardiogenic shock: Secondary | ICD-10-CM | POA: Diagnosis not present

## 2020-04-12 DIAGNOSIS — Z95811 Presence of heart assist device: Secondary | ICD-10-CM | POA: Diagnosis not present

## 2020-04-12 DIAGNOSIS — N179 Acute kidney failure, unspecified: Secondary | ICD-10-CM | POA: Diagnosis not present

## 2020-04-12 LAB — RENAL FUNCTION PANEL
Albumin: 2 g/dL — ABNORMAL LOW (ref 3.5–5.0)
Albumin: 2.1 g/dL — ABNORMAL LOW (ref 3.5–5.0)
Anion gap: 8 (ref 5–15)
Anion gap: 10 (ref 5–15)
BUN: 36 mg/dL — ABNORMAL HIGH (ref 8–23)
BUN: 32 mg/dL — ABNORMAL HIGH (ref 8–23)
CO2: 25 mmol/L (ref 22–32)
CO2: 24 mmol/L (ref 22–32)
Calcium: 8 mg/dL — ABNORMAL LOW (ref 8.9–10.3)
Calcium: 8 mg/dL — ABNORMAL LOW (ref 8.9–10.3)
Chloride: 105 mmol/L (ref 98–111)
Chloride: 102 mmol/L (ref 98–111)
Creatinine, Ser: 2.12 mg/dL — ABNORMAL HIGH (ref 0.61–1.24)
Creatinine, Ser: 1.89 mg/dL — ABNORMAL HIGH (ref 0.61–1.24)
GFR calc Af Amer: 35 mL/min — ABNORMAL LOW (ref >60)
GFR calc Af Amer: 41 mL/min — ABNORMAL LOW (ref >60)
GFR calc non Af Amer: 31 mL/min — ABNORMAL LOW (ref >60)
GFR calc non Af Amer: 35 mL/min — ABNORMAL LOW (ref >60)
Glucose, Bld: 102 mg/dL — ABNORMAL HIGH (ref 70–99)
Glucose, Bld: 107 mg/dL — ABNORMAL HIGH (ref 70–99)
Phosphorus: 2.8 mg/dL (ref 2.5–4.6)
Phosphorus: 2.6 mg/dL (ref 2.5–4.6)
Potassium: 4.6 mmol/L (ref 3.5–5.1)
Potassium: 4.9 mmol/L (ref 3.5–5.1)
Sodium: 138 mmol/L (ref 135–145)
Sodium: 136 mmol/L (ref 135–145)

## 2020-04-12 LAB — LACTATE DEHYDROGENASE: LDH: 256 U/L — ABNORMAL HIGH (ref 98–192)

## 2020-04-12 LAB — PROTIME-INR
INR: 2.6 — ABNORMAL HIGH (ref 0.8–1.2)
Prothrombin Time: 27 seconds — ABNORMAL HIGH (ref 11.4–15.2)

## 2020-04-12 LAB — CBC
HCT: 24.8 % — ABNORMAL LOW (ref 39.0–52.0)
Hemoglobin: 7.7 g/dL — ABNORMAL LOW (ref 13.0–17.0)
MCH: 28.1 pg (ref 26.0–34.0)
MCHC: 31 g/dL (ref 30.0–36.0)
MCV: 90.5 fL (ref 80.0–100.0)
Platelets: 136 10*3/uL — ABNORMAL LOW (ref 150–400)
RBC: 2.74 MIL/uL — ABNORMAL LOW (ref 4.22–5.81)
RDW: 16 % — ABNORMAL HIGH (ref 11.5–15.5)
WBC: 9.8 10*3/uL (ref 4.0–10.5)
nRBC: 0 % (ref 0.0–0.2)

## 2020-04-12 LAB — GLUCOSE, CAPILLARY
Glucose-Capillary: 100 mg/dL — ABNORMAL HIGH (ref 70–99)
Glucose-Capillary: 101 mg/dL — ABNORMAL HIGH (ref 70–99)
Glucose-Capillary: 136 mg/dL — ABNORMAL HIGH (ref 70–99)
Glucose-Capillary: 81 mg/dL (ref 70–99)
Glucose-Capillary: 82 mg/dL (ref 70–99)
Glucose-Capillary: 99 mg/dL (ref 70–99)

## 2020-04-12 LAB — COOXEMETRY PANEL
Carboxyhemoglobin: 1.9 % — ABNORMAL HIGH (ref 0.5–1.5)
Methemoglobin: 0.6 % (ref 0.0–1.5)
O2 Saturation: 63.8 %
Total hemoglobin: 7.5 g/dL — ABNORMAL LOW (ref 12.0–16.0)

## 2020-04-12 LAB — MAGNESIUM: Magnesium: 2.6 mg/dL — ABNORMAL HIGH (ref 1.7–2.4)

## 2020-04-12 MED ORDER — HEPARIN SODIUM (PORCINE) 1000 UNIT/ML DIALYSIS
1000.0000 [IU] | INTRAMUSCULAR | Status: DC | PRN
  Filled 2020-04-12: qty 6

## 2020-04-12 MED ORDER — CHLORHEXIDINE GLUCONATE 0.12% ORAL RINSE (MEDLINE KIT)
15.0000 mL | Freq: Two times a day (BID) | OROMUCOSAL | Status: DC
  Administered 2020-04-12 – 2020-04-15 (×7): 15 mL via OROMUCOSAL

## 2020-04-12 MED ORDER — WARFARIN 0.5 MG HALF TABLET
0.5000 mg | ORAL_TABLET | Freq: Once | ORAL | Status: AC
  Administered 2020-04-12: 0.5 mg via ORAL
  Filled 2020-04-12: qty 1

## 2020-04-12 MED ORDER — SODIUM CHLORIDE 0.9 % IV SOLN
INTRAVENOUS | Status: DC | PRN
  Administered 2020-04-13 – 2020-04-15 (×3): 250 mL via INTRAVENOUS

## 2020-04-12 MED ORDER — ORAL CARE MOUTH RINSE
15.0000 mL | OROMUCOSAL | Status: DC
  Administered 2020-04-12 – 2020-04-16 (×32): 15 mL via OROMUCOSAL

## 2020-04-12 MED ORDER — DARBEPOETIN ALFA 100 MCG/0.5ML IJ SOSY
100.0000 ug | PREFILLED_SYRINGE | INTRAMUSCULAR | Status: DC
  Administered 2020-04-15: 100 ug via SUBCUTANEOUS
  Filled 2020-04-12 (×3): qty 0.5

## 2020-04-12 NOTE — Plan of Care (Signed)
  Care Plan updated with most recent nursing diagnosis related to current condition and progression   Problem: Clinical Measurements: Goal: Ability to maintain clinical measurements within normal limits will improve Outcome: Progressing Goal: Will remain free from infection Outcome: Progressing Goal: Diagnostic test results will improve Outcome: Progressing Goal: Respiratory complications will improve Outcome: Progressing Goal: Cardiovascular complication will be avoided Outcome: Progressing   Problem: Nutrition: Goal: Adequate nutrition will be maintained Outcome: Progressing   Problem: Elimination: Goal: Will not experience complications related to bowel motility Outcome: Not Progressing  Problem: Safety: Goal: Ability to remain free from injury will improve Outcome: Progressing   Problem: Skin Integrity: Goal: Risk for impaired skin integrity will decrease Outcome: Progressing   Problem: Activity: Goal: Risk for activity intolerance will decrease Outcome: Progressing   Problem: Cardiac: Goal: Ability to maintain an adequate cardiac output will improve Outcome: Progressing   Problem: Coping: Goal: Level of anxiety will decrease Outcome: Progressing   Problem: Fluid Volume: Goal: Risk for excess fluid volume will decrease Outcome: Progressing   Problem: Clinical Measurements: Goal: Ability to maintain clinical measurements within normal limits will improve Outcome: Progressing Goal: Will remain free from infection Outcome: Progressing   Problem: Respiratory: Goal: Will regain and/or maintain adequate ventilation Outcome: Progressing   Problem: Activity: Goal: Ability to tolerate increased activity will improve Outcome: Completed/Met   Problem: Respiratory: Goal: Ability to maintain a clear airway and adequate ventilation will improve Outcome: Completed/Met   Problem: Role Relationship: Goal: Method of communication will improve Outcome:  Completed/Met

## 2020-04-12 NOTE — Plan of Care (Signed)
  Problem: Cardiovascular: Goal: Ability to achieve and maintain adequate cardiovascular perfusion will improve Outcome: Progressing   Problem: Health Behavior/Discharge Planning: Goal: Ability to safely manage health-related needs after discharge will improve Outcome: Progressing   Problem: Education: Goal: Understanding of CV disease, CV risk reduction, and recovery process will improve Outcome: Progressing   Problem: Activity: Goal: Ability to return to baseline activity level will improve Outcome: Progressing   Problem: Cardiovascular: Goal: Ability to achieve and maintain adequate cardiovascular perfusion will improve Outcome: Progressing   Problem: Education: Goal: Knowledge of General Education information will improve Description: Including pain rating scale, medication(s)/side effects and non-pharmacologic comfort measures Outcome: Progressing   Problem: Health Behavior/Discharge Planning: Goal: Ability to manage health-related needs will improve Outcome: Progressing   Problem: Clinical Measurements: Goal: Ability to maintain clinical measurements within normal limits will improve Outcome: Progressing Goal: Will remain free from infection Outcome: Progressing Goal: Diagnostic test results will improve Outcome: Progressing Goal: Respiratory complications will improve Outcome: Progressing Goal: Cardiovascular complication will be avoided Outcome: Progressing   Problem: Activity: Goal: Risk for activity intolerance will decrease Outcome: Progressing   Problem: Nutrition: Goal: Adequate nutrition will be maintained Outcome: Progressing   Problem: Coping: Goal: Level of anxiety will decrease Outcome: Progressing   Problem: Elimination: Goal: Will not experience complications related to bowel motility Outcome: Progressing Goal: Will not experience complications related to urinary retention Outcome: Progressing   Problem: Safety: Goal: Ability to remain  free from injury will improve Outcome: Progressing   Problem: Skin Integrity: Goal: Risk for impaired skin integrity will decrease Outcome: Progressing   Problem: Education: Goal: Knowledge of the prescribed therapeutic regimen will improve Outcome: Progressing   Problem: Activity: Goal: Risk for activity intolerance will decrease Outcome: Progressing   Problem: Cardiac: Goal: Ability to maintain an adequate cardiac output will improve Outcome: Progressing   Problem: Coping: Goal: Level of anxiety will decrease Outcome: Progressing   Problem: Fluid Volume: Goal: Risk for excess fluid volume will decrease Outcome: Progressing   Problem: Clinical Measurements: Goal: Ability to maintain clinical measurements within normal limits will improve Outcome: Progressing Goal: Will remain free from infection Outcome: Progressing   Problem: Respiratory: Goal: Will regain and/or maintain adequate ventilation Outcome: Progressing   Problem: Education: Goal: Knowledge of secondary prevention will improve Outcome: Progressing Goal: Knowledge of patient specific risk factors addressed and post discharge goals established will improve Outcome: Progressing Goal: Individualized Educational Video(s) Outcome: Progressing   Problem: Activity: Goal: Ability to tolerate increased activity will improve Outcome: Progressing   Problem: Respiratory: Goal: Ability to maintain a clear airway and adequate ventilation will improve Outcome: Progressing   Problem: Role Relationship: Goal: Method of communication will improve Outcome: Progressing

## 2020-04-12 NOTE — Progress Notes (Signed)
Patient ID: [name] [name], male   DOB: 1951-05-13, 69 y.o.   MRN: [id number]    Advanced Heart Failure Rounding Note   Subjective:    - S/p HM3 VAD 4/8 w MAZE procedure + LAA clipping. - Extubated 4/9 - 4/12 LVAD speed increased to 5400 --> echo moderate-severe RV dysfunction, septum mildly left shifted, trivial pericardial effusion.  LVAD speed cut back to 5300.  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  - 4/20 developed hemothorax requiring emergent CT placement and eventual VATS - 4/21 EEG done given concerns for posturing and c/w moderate to severe diffuse encephalopathy. No seizures or epileptiform discharges were seen  - 04/03/20 Pump Stop --> controller change out. CT of head unchanged R MCA CVA - 04/06/20 Extubated  Sleeping this morning.  CVVH running.  CVP 6, co-ox 64%.  MAP 70s-80s.    LDH 256, INR 2.6.   VAD Interrogation  Flow 4, Speed 5300, Power 3.7  Pulse Index 4.2. No PI events   VAD interrogated personally. Parameters stable.   Objective:   Weight Range:  Vital Signs:   Temp:  [97.6 F (36.4 C)-99 F (37.2 C)] 98 F (36.7 C) (05/01 0405) Pulse Rate:  [60-122] 86 (05/01 0615) Resp:  [14-31] 14 (05/01 0615) BP: (75-114)/(64-93) 87/65 (05/01 0615) SpO2:  [92 %-100 %] 92 % (05/01 0615) Arterial Line BP: (73-110)/(58-81) 101/65 (05/01 0615) Weight:  [99.1 kg-100.7 kg] 99.1 kg (05/01 0525) Last BM Date: 04/11/20  Weight change: Filed Weights   04/10/20 0500 04/11/20 1352 04/12/20 0525  Weight: 99 kg 100.7 kg 99.1 kg    Intake/Output:   Intake/Output Summary (Last 24 hours) at 04/12/2020 0746 Last data filed at 04/12/2020 0700 Gross per 24 hour  Intake 1912.49 ml  Output 1657 ml  Net 255.49 ml    PHYSICAL EXAM: CVP 5-6 General: Well appearing this am. NAD.  HEENT: Normal. Neck: Supple, JVP 7-8 cm. Carotids OK.  Cardiac:  Mechanical heart sounds with LVAD hum present.  Lungs:  CTAB, normal effort.  Abdomen:  NT, ND, no HSM. No  bruits or masses. +BS  LVAD exit site: Well-healed and incorporated. Dressing dry and intact. No erythema or drainage. Stabilization device present and accurately applied. Driveline dressing changed daily per sterile technique. Extremities:  Warm and dry. No cyanosis, clubbing, rash, or edema.  Neuro:  Left side weak   Telemetry: Afib 80s Personally reviewed    Labs: Basic Metabolic Panel: Recent Labs  Lab 04/08/20 0350 04/08/20 1632 04/09/20 0438 04/09/20 1653 04/10/20 0413 04/10/20 0413 04/10/20 1633 04/10/20 1633 04/11/20 0414 04/11/20 1822 04/12/20 0405  NA 137   < > 138   < > 138  --  137  --  137 137 138  K 4.0   < > 4.2   < > 4.3  --  4.7  --  4.7 4.5 4.6  CL 101   < > 105   < > 102  --  104  --  102 104 105  CO2 25   < > 21*   < > 28  --  24  --  25 25 25   GLUCOSE 132*   < > 117*   < > 137*  --  137*  --  120* 103* 102*  BUN 32*   < > 32*   < > 31*  --  32*  --  37* 36* 36*  CREATININE 1.88*   < > 1.83*   < >  1.88*  --  2.03*  --  2.30* 2.16* 2.12*  CALCIUM 8.4*   < > 8.3*   < > 8.5*   < > 8.4*   < > 8.2* 8.2* 8.0*  MG 2.5*  --  2.6*  --  2.6*  --   --   --  2.4  --  2.6*  PHOS 2.6   < > 2.3*   < > 2.6  --  2.6  --  2.5 2.8 2.8   < > = values in this interval not displayed.    Liver Function Tests: Recent Labs  Lab 04/06/20 0256 04/06/20 1556 04/10/20 0413 04/10/20 1633 04/11/20 0414 04/11/20 1822 04/12/20 0405  AST 34  --   --   --   --   --   --   ALT 21  --   --   --   --   --   --   ALKPHOS 129*  --   --   --   --   --   --   BILITOT 0.8  --   --   --   --   --   --   PROT 5.7*  --   --   --   --   --   --   ALBUMIN 2.1*  2.2*   < > 2.3* 2.3* 2.1* 2.1* 2.0*   < > = values in this interval not displayed.   No results for input(s): LIPASE, AMYLASE in the last 168 hours. No results for input(s): AMMONIA in the last 168 hours.  CBC: Recent Labs  Lab 04/08/20 0350 04/09/20 0438 04/10/20 0413 04/11/20 0414 04/12/20 0405  WBC 11.9* 8.0 7.5 9.2  9.8  HGB 8.9* 8.1* 8.6* 8.3* 7.7*  HCT 28.3* 25.7* 28.0* 27.0* 24.8*  MCV 92.5 90.8 92.1 91.2 90.5  PLT 152 125* 136* 137* 136*    Cardiac Enzymes: No results for input(s): CKTOTAL, CKMB, CKMBINDEX, TROPONINI in the last 168 hours.  BNP: BNP (last 3 results) Recent Labs    03/26/20 2329 04/02/20 2342 04/10/20 0132  BNP 425.7* 332.7* 267.2*    ProBNP (last 3 results) No results for input(s): PROBNP in the last 8760 hours.    Other results:  Imaging: No results found.   Medications:     Scheduled Medications: . sodium chloride   Intravenous Once  . aspirin  81 mg Per Tube Daily  . atorvastatin  80 mg Per Tube q1800  . B-complex with vitamin C  1 tablet Per Tube Daily  . bisacodyl  10 mg Oral Daily   Or  . bisacodyl  10 mg Rectal Daily  . Chlorhexidine Gluconate Cloth  6 each Topical Daily  . [START ON 04/15/2020] darbepoetin (ARANESP) injection - NON-DIALYSIS  100 mcg Subcutaneous Q Tue-1800  . docusate  200 mg Per Tube Daily  . feeding supplement (PRO-STAT SUGAR FREE 64)  30 mL Per Tube QID  . influenza vaccine adjuvanted  0.5 mL Intramuscular Tomorrow-1000  . insulin aspart  0-24 Units Subcutaneous Q4H  . insulin aspart  6 Units Subcutaneous Q4H  . insulin glargine  24 Units Subcutaneous BID  . mouth rinse  15 mL Mouth Rinse BID  . melatonin  3 mg Per Tube QHS  . metoCLOPramide (REGLAN) injection  5 mg Intravenous Q8H  . midodrine  10 mg Per Tube TID WC  . pantoprazole (PROTONIX) IV  40 mg Intravenous Daily  . polyethylene glycol  17 g Per Tube  Daily  . QUEtiapine  25 mg Per Tube QHS  . sodium chloride flush  10-40 mL Intracatheter Q12H  . Warfarin - Pharmacist Dosing Inpatient   Does not apply q1600    Infusions: .  prismasol BGK 4/2.5 500 mL/hr at 04/12/20 0257  .  prismasol BGK 4/2.5 300 mL/hr at 04/12/20 0646  . sodium chloride Stopped (03/22/20 2011)  . sodium chloride 10 mL/hr at 04/12/20 0700  . sodium chloride    . sodium chloride 10 mL/hr at  04/05/20 1815  . sodium chloride    . dexmedetomidine (PRECEDEX) IV infusion Stopped (04/07/20 1696)  . epinephrine Stopped (04/05/20 0854)  . feeding supplement (VITAL 1.5 CAL) 1,000 mL (04/12/20 0400)  . lactated ringers Stopped (03/29/20 0748)  . norepinephrine (LEVOPHED) Adult infusion Stopped (04/11/20 2215)  . prismasol BGK 4/2.5 1,800 mL/hr at 04/12/20 0527  . vasopressin (PITRESSIN) infusion - *FOR SHOCK* Stopped (04/07/20 0515)    PRN Medications: sodium chloride, acetaminophen, dextrose, fentaNYL (SUBLIMAZE) injection, heparin, heparin, hydrALAZINE, HYDROcodone-acetaminophen, levalbuterol, ondansetron (ZOFRAN) IV, ondansetron (ZOFRAN) IV, oxyCODONE, sodium chloride, sodium chloride flush, sorbitol, traMADol   Assessment/Plan:   1. Acute on chronic systolic HF -> cardiogenic shock-> S/p HM3 LVAD - Echo 2016 EF 30-35% - Echo 1/21 EF 25% - Admitted with NYHA IV symptoms and AKI with attempts at diuresis. Initial co-ox 39% - Echo this admit: EF 10-15% moderate RV dysfunction - R/LHC cath 3/21 with severe 3v CAD and low output with CI 1.7.  - Initially planned for CABG but given need for re-do sternotomy, relatively poor targets and longstanding low EF, VAD felt to be better option  - S/p HM3 VAD 4/8 - Speed turned up on 4/11 to  5400, but looking at 4/10 echo there is significant RV dysfunction with leftward septal shift so speed decreased back to 5300 rpm.  - Off milrinone. Off NE. MAP 70s-80s. CO-ox 64% - Volume status looks good on CVVHD. CVP 5-6.  To stop CVVH at 5 pm, probably will get first iHD on 5/3.   2. VAD - VAD interrogated personally. Parameters stable. - Had Pump Stop 04/03/20. Controller changed out with old controller sent back to [facility] for evaluation.  - on aspirin 81 mg daily + coumadin.  - LDH 256 - INR 2.6  Discussed dosing with PharmD personally. - Driveline ok   3. CAD with unstable angina - s/p previous PCI. - cath 02/16/2020 with severe 3v CAD - now  s/p VAD on 04/07/2020   - No chest pain.   4. AKI on CKD 3a - Nephrology consulted. Started CVVHD 4/13  - Renal US 4/5 unremarkable.  - Hopefully renal function will recover but Renal team not optimistic - He is now off NE w/ stable MAP.  - CVVH to stop at 5 pm, iHD likely on 5/3.   5. Permanent AF - now s/p MAZE + LAA Clipping 4/8   - Rate controlled - On coumadin. INR 2.6  6. MVP s/p MV repair - On admit with recurrent severe MR on echo and with huge v-waves on PCWP tracing - s/p VAD  7. Hemothoax - s/p CT placement and VATs on 4/22 - CTs pulled 4/26 - stable  8. ID/leukocytosis - 4/11 procalcitonin 1.96  - Started empiric coverage for PNA with vancomycin/cefepime for 7 days. Stopped 4/17 - Given recent VATs and high-dose pressor requirements he was treated with broad spectrum abx, linezolid and cefepime (completed course 4/28)  - Remains AF  9. Acute Hypoxic Respiratory  Failure - Re-intubated 4/20 with hemothorax- - Extubated 4/25 - Remains stable on [place]  10. Malnutrition  - Albumin 2.6  - On tube feeds.  - Speech following, still NPO with ice chips, needs to be re-evaluated.  Discussed with nurse.    11. CVA/anoxic brain injury - 03/27/20 CT large posterior MCA ischemic infarct, possibly from atrial fibrillation.  INR was supratherapeutic when CVA found.  Stable LDH, do not think partial pump thrombosis is the culprit. - Still with some L weakness and inattention but  Improving  - PT/OT/speech following.  - CIR consulted.   - Continue to mobilize, very weak.   12. Anemia, post-op  - Received 2u RBC 04/05/20 - Hgb stable 7.7.   - Transfuse if hemoglobin < 7.5   13. Hyperkalemia - Resolved.  - Continue CVVHD  14. DM2, poorly controlled - hgbA1c 11.1% - continue insulin   Length of Stay: 31    CRITICAL CARE Performed by: Loralie Champagne  Total critical care time: 35 minutes  Critical care time was exclusive of separately billable procedures and treating  other patients.  Critical care was necessary to treat or prevent imminent or life-threatening deterioration.  Critical care was time spent personally by me (independent of midlevel providers or residents) on the following activities: development of treatment plan with patient and/or surrogate as well as nursing, discussions with consultants, evaluation of patient's response to treatment, examination of patient, obtaining history from patient or surrogate, ordering and performing treatments and interventions, ordering and review of laboratory studies, ordering and review of radiographic studies, pulse oximetry and re-evaluation of patient's condition.    Loralie Champagne, MD  7:46 AM  04/12/2020

## 2020-04-12 NOTE — Progress Notes (Signed)
Admit: 02/28/2020 LOS: 30  1M dialysis dependent AKI on CRRT, acute CHF exacerbation s/p VAD, status post MV repair, AFib s/p MAZE and LAA clipping 4/8; R MCA CVA 4/15; s/p hemothorax and VATS 4/20  S: Seen and examined on CRRT.  Procedure supervised. Had 1.1 liters UF over 4/30 with CRRT as charted thus far.  Was kept even.  He has been off of pressors and on 2 liters oxygen.   Review of systems:     Denies shortness of breath; has been on 2 liters oxygen Denies n/v  On tube feeds  O: 04/30 0701 - 05/01 0700 In: 1792.5 [I.V.:209.6; NG/GT:1330; IV Piggyback:252.9] Out: 1454 [Stool:390]  Filed Weights   04/10/20 0500 04/11/20 1352 04/12/20 0525  Weight: 99 kg 100.7 kg 99.1 kg   Physical exam   General adult male in bed critically ill  HEENT normocephalic atraumatic  Lungs coarse breath sounds  Heart VAD off of levo Abdomen soft nontender nondistended Extremities no lower extremity edema  Neuro - awakens to voice; answers questions  Access: right [place] nontunneled catheter     Recent Labs  Lab 04/11/20 0414 04/11/20 1822 04/12/20 0405  NA 137 137 138  K 4.7 4.5 4.6  CL 102 104 105  CO2 25 25 25   GLUCOSE 120* 103* 102*  BUN 37* 36* 36*  CREATININE 2.30* 2.16* 2.12*  CALCIUM 8.2* 8.2* 8.0*  PHOS 2.5 2.8 2.8   Recent Labs  Lab 04/10/20 0413 04/11/20 0414 04/12/20 0405  WBC 7.5 9.2 9.8  HGB 8.6* 8.3* 7.7*  HCT 28.0* 27.0* 24.8*  MCV 92.1 91.2 90.5  PLT 136* 137* 136*    Scheduled Meds: . sodium chloride   Intravenous Once  . aspirin  81 mg Per Tube Daily  . atorvastatin  80 mg Per Tube q1800  . B-complex with vitamin C  1 tablet Per Tube Daily  . bisacodyl  10 mg Oral Daily   Or  . bisacodyl  10 mg Rectal Daily  . Chlorhexidine Gluconate Cloth  6 each Topical Daily  . docusate  200 mg Per Tube Daily  . feeding supplement (PRO-STAT SUGAR FREE 64)  30 mL Per Tube QID  . influenza vaccine adjuvanted  0.5 mL Intramuscular Tomorrow-1000  . insulin aspart   0-24 Units Subcutaneous Q4H  . insulin aspart  6 Units Subcutaneous Q4H  . insulin glargine  24 Units Subcutaneous BID  . mouth rinse  15 mL Mouth Rinse BID  . melatonin  3 mg Per Tube QHS  . metoCLOPramide (REGLAN) injection  5 mg Intravenous Q8H  . midodrine  10 mg Per Tube TID WC  . pantoprazole (PROTONIX) IV  40 mg Intravenous Daily  . polyethylene glycol  17 g Per Tube Daily  . QUEtiapine  25 mg Per Tube QHS  . sodium chloride flush  10-40 mL Intracatheter Q12H  . Warfarin - Pharmacist Dosing Inpatient   Does not apply q1600   Continuous Infusions: .  prismasol BGK 4/2.5 500 mL/hr at 04/12/20 0257  .  prismasol BGK 4/2.5 300 mL/hr at 04/11/20 1333  . sodium chloride Stopped (03/22/20 2011)  . sodium chloride 10 mL/hr at 04/12/20 0500  . sodium chloride    . sodium chloride 10 mL/hr at 04/05/20 1815  . sodium chloride    . dexmedetomidine (PRECEDEX) IV infusion Stopped (04/07/20 1610)  . epinephrine Stopped (04/05/20 0854)  . feeding supplement (VITAL 1.5 CAL) 1,000 mL (04/12/20 0400)  . lactated ringers Stopped (03/29/20 0748)  .  norepinephrine (LEVOPHED) Adult infusion Stopped (04/11/20 2215)  . prismasol BGK 4/2.5 1,800 mL/hr at 04/12/20 0527  . vasopressin (PITRESSIN) infusion - *FOR SHOCK* Stopped (04/07/20 0515)   PRN Meds:.sodium chloride, acetaminophen, dextrose, fentaNYL (SUBLIMAZE) injection, heparin, heparin, hydrALAZINE, HYDROcodone-acetaminophen, levalbuterol, ondansetron (ZOFRAN) IV, ondansetron (ZOFRAN) IV, oxyCODONE, sodium chloride, sodium chloride flush, sorbitol, traMADol  ABG    Component Value Date/Time   PHART 7.383 04/07/2020 0802   PCO2ART 45.2 04/07/2020 0802   PO2ART 72 (L) 04/07/2020 0802   HCO3 27.3 04/07/2020 0802   TCO2 29 04/07/2020 0802   ACIDBASEDEF 2.0 04/10/2020 1405   O2SAT 63.8 04/12/2020 0347    A/P  1. Dialysis dependent AKI.  CRRT started 4/13.   1. Stop CRRT today at 5pm (5/1).  Do not restart if circuit clots today.  Hope to  transition to [facility] for first tx on 5/3 anticipated.  2. While running, keep even. no circuit anticoagluation.  systemic anticoagulation on hold after hemothorax 3. Increase blood flow to 300 ml/min 2. Hypophosphatemia, replete prn for P < 2.5  3. sCHF s/p LVAD 4/8 - optimize volume status  4. Hemothorax s/p VATS 03/28/2020.  5. AFib s/p MAZE and LAA clipping 4/8 6. DM2 - per primary team  7. R MCA ischemic CVA - noted  8. Cardiogenic Shock - off of norepi.  Note on midodrine 9. Anemia - normocytic - no acute indication for PRBC's.  Started ESA  - aranesp 40 mcg IV once on 4/27 (Tues).  Increased dose to aranesp 100 mcg for next dose      Claudia Desanctis, MD 04/12/2020 5:47 AM

## 2020-04-12 NOTE — Progress Notes (Signed)
ANTICOAGULATION CONSULT NOTE  Pharmacy Consult for warfarin Indication: post op LVAD  / Aflutter  Allergies  Allergen Reactions  . Liraglutide Nausea And Vomiting  . Lisinopril Cough    Patient Measurements: Height: 6' (182.9 cm) Weight: 99.1 kg (218 lb 7.6 oz) IBW/kg (Calculated) : 77.6 Heparin Dosing Weight: 101.2 kg  Vital Signs: Temp: 96.1 F (35.6 C) (05/01 1139) Temp Source: Axillary (05/01 1139) BP: 112/88 (05/01 1000) Pulse Rate: 69 (05/01 1000)  Labs: Recent Labs    04/10/20 0413 04/10/20 1633 04/11/20 0414 04/11/20 1822 04/12/20 0405  HGB 8.6*  --  8.3*  --  7.7*  HCT 28.0*  --  27.0*  --  24.8*  PLT 136*  --  137*  --  136*  LABPROT 24.9*  --  24.9*  --  27.0*  INR 2.4*  --  2.3*  --  2.6*  CREATININE 1.88*   < > 2.30* 2.16* 2.12*   < > = values in this interval not displayed.    Estimated Creatinine Clearance: 39.5 mL/min (A) (by C-G formula based on SCr of 2.12 mg/dL (H)).   Medical History: Past Medical History:  Diagnosis Date  . Abdominal pain 01/05/2020  . Abnormal nuclear stress test 01/14/2015  . Acute on chronic combined systolic and diastolic CHF (congestive heart failure) ([place])   . Acute respiratory failure with hypoxia ([place])   . AKI (acute kidney injury) ([place])   . Anxiety   . Arthritis   . Atrial fibrillation ([place]) 11/29/2016  . Atrial fibrillation with RVR ([place])   . Atrial fibrillation, chronic ([place]) 11/29/2016   Last Assessment & Plan:  Formatting of this note might be different from the original. -continue present medications with warfarin anti-coagulation Last Assessment & Plan:  Formatting of this note might be different from the original. -chronic -anti-coagulated -rate controlled  . Automatic implantable cardioverter-defibrillator in situ 08/18/2011   Formatting of this note might be different from the original. Last Assessment & Plan:  Now followed in ICD clinic. He has followup in January.  Last Assessment & Plan:  Formatting of  this note might be different from the original. -historical note  . Benign prostatic hyperplasia   . Bipolar I disorder with depression ([place]) 01/06/2011   Last Assessment & Plan:  -historical note in chart -continue home medications  Last Assessment & Plan:  Formatting of this note might be different from the original. -historical note in chart -continue home medications  . CAD (coronary artery disease)    a.  1993 s/p MI - [employer] [facility];  b. s/p BMS to LAD '00;  c. PTCA 2nd diagonal 2010;  d. 02/18/12 Cath: moderate nonobs dzs - med rx;  e.  01/2015 Cath: LM nl, LAD 40-44m ISR, 70-37m/d, d1 90p (3.0x16 Synergy DES), D2 50-60, LCX nl, OM1 50p, 53m (2.5x12 Synergy DES), RCA nl, EF 30-35%.  . Cardiogenic shock ([place]) 03/21/2020  . Chronic anticoagulation 09/22/2018   Last Assessment & Plan:  -continue warfarin for atrial fibrillation with pharmacy to manage  Last Assessment & Plan:  Formatting of this note might be different from the original. -continue warfarin for atrial fibrillation with pharmacy to manage  . Chronic combined systolic and diastolic CHF (congestive heart failure) ([place])    a. 12/2014 Echo: EF 30-35%, Gr2 DD, mod MR, sev dil LA.  Marland Kitchen Chronic congestive heart failure ([place]) 04/22/2015   Last Assessment & Plan:  Formatting of this note might be different from the original. -known EF 20-25% -continue  medical management -appears stable clinically  Last Assessment & Plan:  Formatting of this note might be different from the original. -noted with abnormal EF and chronic activity restrictions -medically manage  . Chronic systolic dysfunction of left ventricle 07/25/2013  . CKD (chronic kidney disease) 09/22/2018  . CKD (chronic kidney disease), stage III   . Coagulopathy ([place])   . COVID-19 04/17/2019   Last Assessment & Plan:  -Apparent scattered infiltrates on CT chest.  -SARS Co V positive -afebrile and not hypoxic at rest; does qualify for home o2 with sats 88% on room air with activity -home today  on self-quarantine x 14 days -finish azithromycin at home -ask Home Health to check on patient Last Assessment & Plan:  -has persistently positive test since initial verification 04/16/19 -contract/d  . Depression   . Diabetic retinopathy associated with type 2 diabetes mellitus ([place]) 01/30/2020  . Diarrhea 01/05/2020  . Dizziness 09/27/2016  . DM (diabetes mellitus), type 2 ([place]) 03/19/2013   Last Assessment & Plan:  Formatting of this note might be different from the original. -hold metformin -reduce insulin with accuchecks and SSI -diet consult -HbA1c  . DNR (do not resuscitate) discussion 03/18/2013  . ED (erectile dysfunction)   . Elevated troponin 09/22/2018   Last Assessment & Plan:  Formatting of this note might be different from the original. -trend -mild elevation not thought to be ischemic on admission from history  . GERD (gastroesophageal reflux disease)   . Hyperlipidemia   . Hypertension   . ICD (implantable cardiac defibrillator) battery depletion 03/18/2013  . IHD (ischemic heart disease) 01/08/2019   Formatting of this note might be different from the original. Last Assessment & Plan:  Minimal symptoms of angina. He is on Ranexa, isosorbide, and a beta blocker. We'll try to maximize his blood pressure therapy. I stressed the importance of regular daily aerobic exercise. He needs to follow a heart healthy/diabetic diet.  . Ischemic cardiomyopathy    a. s/p [employer] [name] (Atlas) ICD implanted in [place] 2007;  b. 12/2014 Echo: Ef 30-35%.  . Ischemic dilated cardiomyopathy ([place]) 04/08/2020  . LVAD (left ventricular assist device) present ([place]) 03/21/2020  . MVP (mitral valve prolapse)    a. s/p MV annuloplasty at [facility] 2004.  . Non-rheumatic mitral regurgitation 01/08/2019   Formatting of this note might be different from the original. Last Assessment & Plan:  He does have a murmur of mitral insufficiency by exam. We will followup an echocardiogram to assess the success of his mitral valve  repair.  . Other chest pain 06/01/2019   Last Assessment & Plan:  Formatting of this note might be different from the original. -known COVID19 with known CAD and cardiomyopathy -trend troponins -consider other etiologies like cardiac involvement with COVID19 -consult DR Gavin Potters -consider echo (although none available until 6/22) -await CT thorax result  . Palliative care by specialist   . Persistent atrial fibrillation ([place])    a. noted on ICD interrogation '10 - not previously on [facility] - CHA2DS2VASc = 5.  . Pneumonia, unspecified organism 09/22/2018  . S/P coronary artery stent placement   . Severe episode of recurrent major depressive disorder, without psychotic features ([place]) 02/07/2020  . Shortness of breath   . Syncope 09/15/2012  . Tick bite 06/02/2019   Last Assessment & Plan:  -reported by ER staff; had tick on umbilicus (removed) -monitor for any signs requiring treatment (I.e. rash, fever)  Last Assessment & Plan:  Formatting of this note might be  different from the original. -reported by ER staff; had tick on umbilicus (removed) -monitor for any signs requiring treatment (I.e. rash, fever)  . TSH elevation   . Type II diabetes mellitus ([facility])    uncontrolled  . Unstable angina ([place]) 02/19/2012    Medications:  Medications Prior to Admission  Medication Sig Dispense Refill Last Dose  . ALPRAZolam (XANAX) 0.25 MG tablet Take 0.25 mg by mouth 3 (three) times daily as needed for anxiety.    unknown  . amiodarone (PACERONE) 200 MG tablet Take 200 mg by mouth daily.    03/11/2020 at Unknown time  . apixaban (ELIQUIS) 5 MG TABS tablet Take 1 tablet (5 mg total) by mouth 2 (two) times daily. 60 tablet 1 03/11/2020 at 0600  . atorvastatin (LIPITOR) 40 MG tablet Take 1 tablet (40 mg total) by mouth daily at 6 PM. 30 tablet 0 03/10/2020 at Unknown time  . citalopram (CELEXA) 20 MG tablet Take 20 mg by mouth daily.    03/11/2020 at Unknown time  . furosemide (LASIX) 40 MG tablet Take 40 mg by mouth  daily.    03/11/2020 at Unknown time  . insulin aspart (NOVOLOG FLEXPEN) 100 UNIT/ML FlexPen Inject 6 Units into the skin 3 (three) times daily with meals. 15 mL 0 03/11/2020 at Unknown time  . Insulin Glargine (BASAGLAR KWIKPEN) 100 UNIT/ML SOPN Inject 0.5 mLs (50 Units total) into the skin daily. 15 mL 1 03/11/2020 at Unknown time  . metoprolol succinate (TOPROL-XL) 100 MG 24 hr tablet Take 1 tablet (100 mg total) by mouth daily. 90 tablet 3 03/11/2020 at 0600  . nitroGLYCERIN (NITROSTAT) 0.4 MG SL tablet Place 1 tablet (0.4 mg total) under the tongue every 5 (five) minutes x 3 doses as needed for chest pain. 10 tablet 0 unknown  . pantoprazole (PROTONIX) 40 MG tablet Take 1 tablet (40 mg total) by mouth daily. 30 tablet 1 03/11/2020 at Unknown time  . potassium chloride SA (KLOR-CON) 20 MEQ tablet Take 20 mEq by mouth daily.    03/11/2020 at Unknown time  . tamsulosin (FLOMAX) 0.4 MG CAPS capsule Take 1 capsule (0.4 mg total) by mouth daily. 30 capsule 0 03/11/2020 at Unknown time  . ALPRAZolam (XANAX) 1 MG tablet Take 1 tablet (1 mg total) by mouth 2 (two) times daily as needed for anxiety. (Patient not taking: Reported on 02/20/2020) 10 tablet 0 Not Taking at Unknown time    Assessment: 22 YOM with atrial fibrillation on Eliquis PTA s/p R & L heart cath found to have 3v disease s/p  LVAD  Implant 4/8. Warfarin initiated on 4/9 after discussing with MD.  CT on 4/15 now found to have large right MCA stroke. INR had been elevated up to 9.0 postop, he received 1 dose of vitamin K 1 mg IV on 4/13 and again on 4/15 with INR still elevated at 6.2.   Warfarin resumed cautiously on 4/17. Held doses since 4/20 since he went to the OR for VATS. Warfarin was cautiously resumed on 4/23.  INR 2.6, slightly above goal. Hgb stable 7.7, Pltc stable 136. LDH stable 200s. No bleeding noted. Stop amio 4/29 - chronic Afib rate improved now weaning pressors - warfarin needs may increase  Goal of Therapy:  INR  2-2.5 Monitor platelets by anticoagulation protocol: Yes   Plan:  Warfarin 0.5 mg x1 today -Daily INR and CBC  [place] Crane, Roylene Reason, [facility] Clinical Pharmacist Phone [phone]  04/12/2020 12:45 PM  Please check [email or web address] for unit-specific pharmacist phone numbers

## 2020-04-12 NOTE — Progress Notes (Signed)
Per RN request CPT held at 0400.

## 2020-04-12 DEATH — deceased

## 2020-04-13 DIAGNOSIS — N179 Acute kidney failure, unspecified: Secondary | ICD-10-CM | POA: Diagnosis not present

## 2020-04-13 DIAGNOSIS — I482 Chronic atrial fibrillation, unspecified: Secondary | ICD-10-CM

## 2020-04-13 DIAGNOSIS — J9601 Acute respiratory failure with hypoxia: Secondary | ICD-10-CM | POA: Diagnosis not present

## 2020-04-13 DIAGNOSIS — R570 Cardiogenic shock: Secondary | ICD-10-CM | POA: Diagnosis not present

## 2020-04-13 LAB — LACTATE DEHYDROGENASE: LDH: 267 U/L — ABNORMAL HIGH (ref 98–192)

## 2020-04-13 LAB — RENAL FUNCTION PANEL
Albumin: 2 g/dL — ABNORMAL LOW (ref 3.5–5.0)
Anion gap: 7 (ref 5–15)
BUN: 51 mg/dL — ABNORMAL HIGH (ref 8–23)
CO2: 25 mmol/L (ref 22–32)
Calcium: 7.8 mg/dL — ABNORMAL LOW (ref 8.9–10.3)
Chloride: 106 mmol/L (ref 98–111)
Creatinine, Ser: 2.87 mg/dL — ABNORMAL HIGH (ref 0.61–1.24)
GFR calc Af Amer: 25 mL/min — ABNORMAL LOW (ref >60)
GFR calc non Af Amer: 21 mL/min — ABNORMAL LOW (ref >60)
Glucose, Bld: 96 mg/dL (ref 70–99)
Phosphorus: 2.9 mg/dL (ref 2.5–4.6)
Potassium: 5.2 mmol/L — ABNORMAL HIGH (ref 3.5–5.1)
Sodium: 138 mmol/L (ref 135–145)

## 2020-04-13 LAB — CBC
HCT: 25.4 % — ABNORMAL LOW (ref 39.0–52.0)
Hemoglobin: 7.8 g/dL — ABNORMAL LOW (ref 13.0–17.0)
MCH: 28.5 pg (ref 26.0–34.0)
MCHC: 30.7 g/dL (ref 30.0–36.0)
MCV: 92.7 fL (ref 80.0–100.0)
Platelets: 175 10*3/uL (ref 150–400)
RBC: 2.74 MIL/uL — ABNORMAL LOW (ref 4.22–5.81)
RDW: 16.2 % — ABNORMAL HIGH (ref 11.5–15.5)
WBC: 12.1 10*3/uL — ABNORMAL HIGH (ref 4.0–10.5)
nRBC: 0 % (ref 0.0–0.2)

## 2020-04-13 LAB — COOXEMETRY PANEL
Carboxyhemoglobin: 1.8 % — ABNORMAL HIGH (ref 0.5–1.5)
Methemoglobin: 0.7 % (ref 0.0–1.5)
O2 Saturation: 68.3 %
Total hemoglobin: 11.6 g/dL — ABNORMAL LOW (ref 12.0–16.0)

## 2020-04-13 LAB — GLUCOSE, CAPILLARY
Glucose-Capillary: 112 mg/dL — ABNORMAL HIGH (ref 70–99)
Glucose-Capillary: 127 mg/dL — ABNORMAL HIGH (ref 70–99)
Glucose-Capillary: 130 mg/dL — ABNORMAL HIGH (ref 70–99)
Glucose-Capillary: 146 mg/dL — ABNORMAL HIGH (ref 70–99)
Glucose-Capillary: 158 mg/dL — ABNORMAL HIGH (ref 70–99)
Glucose-Capillary: 87 mg/dL (ref 70–99)

## 2020-04-13 LAB — PROTIME-INR
INR: 2.9 — ABNORMAL HIGH (ref 0.8–1.2)
Prothrombin Time: 29 seconds — ABNORMAL HIGH (ref 11.4–15.2)

## 2020-04-13 LAB — MAGNESIUM: Magnesium: 2.5 mg/dL — ABNORMAL HIGH (ref 1.7–2.4)

## 2020-04-13 MED ORDER — ALTEPLASE 2 MG IJ SOLR
2.0000 mg | Freq: Once | INTRAMUSCULAR | Status: AC
  Administered 2020-04-13: 2 mg
  Filled 2020-04-13: qty 2

## 2020-04-13 MED ORDER — SODIUM ZIRCONIUM CYCLOSILICATE 10 G PO PACK
10.0000 g | PACK | Freq: Once | ORAL | Status: AC
  Administered 2020-04-13: 10 g via ORAL
  Filled 2020-04-13: qty 1

## 2020-04-13 MED ORDER — CHLORHEXIDINE GLUCONATE CLOTH 2 % EX PADS
6.0000 | MEDICATED_PAD | Freq: Every day | CUTANEOUS | Status: DC
  Administered 2020-04-14 – 2020-04-15 (×2): 6 via TOPICAL

## 2020-04-13 NOTE — Progress Notes (Signed)
Admit: 02/27/2020 LOS: 90  44M dialysis dependent AKI on CRRT, acute CHF exacerbation s/p VAD, status post MV repair, AFib s/p MAZE and LAA clipping 4/8; R MCA CVA 4/15; s/p hemothorax and VATS 4/20  S: Had 782 mL UF with CRRT over 5/1.  Was kept even and then CRRT stopped per my request.  Feels ok this AM.  They are going to try sitting on side of the bed in a few minutes  Review of systems:     Denies shortness of breath; has been on 2 liters oxygen Denies n/v  On tube feeds  O: 05/01 0701 - 05/02 0700 In: 1738.3 [I.V.:218.3; NG/GT:1520] Out: 782   Filed Weights   04/10/20 0500 04/11/20 1352 04/12/20 0525  Weight: 99 kg 100.7 kg 99.1 kg   Physical exam   General adult male in bed wearing glasses today HEENT normocephalic atraumatic  Lungs clear on right; left lung sounds obscured by vad Heart irregular rate 80's o/n and HR 90 now; VAD; off of levo Abdomen soft nontender nondistended Extremities no lower extremity edema  Neuro - awakens to voice; answers questions  Access: right [place] nontunneled catheter     Recent Labs  Lab 04/12/20 0405 04/12/20 1610 04/13/20 0418  NA 138 136 138  K 4.6 4.9 5.2*  CL 105 102 106  CO2 _0 GLUCOSE 102* 107* 96  BUN 36* 32* 51*  CREATININE 2.12* 1.89* 2.87*  CALCIUM 8.0* 8.0* 7.8*  PHOS 2.8 2.6 2.9   Recent Labs  Lab 04/11/20 0414 04/12/20 0405 04/13/20 0418  WBC 9.2 9.8 12.1*  HGB 8.3* 7.7* 7.8*  HCT 27.0* 24.8* 25.4*  MCV 91.2 90.5 92.7  PLT 137* 136* 175    Scheduled Meds: . aspirin  81 mg Per Tube Daily  . atorvastatin  80 mg Per Tube q1800  . B-complex with vitamin C  1 tablet Per Tube Daily  . bisacodyl  10 mg Oral Daily   Or  . bisacodyl  10 mg Rectal Daily  . chlorhexidine gluconate (MEDLINE KIT)  15 mL Mouth Rinse BID  . Chlorhexidine Gluconate Cloth  6 each Topical Daily  . [START ON 04/15/2020] darbepoetin (ARANESP) injection - NON-DIALYSIS  100 mcg Subcutaneous Q Tue-1800  . feeding supplement  (PRO-STAT SUGAR FREE 64)  30 mL Per Tube QID  . influenza vaccine adjuvanted  0.5 mL Intramuscular Tomorrow-1000  . insulin aspart  0-24 Units Subcutaneous Q4H  . insulin aspart  6 Units Subcutaneous Q4H  . insulin glargine  24 Units Subcutaneous BID  . mouth rinse  15 mL Mouth Rinse 10 times per day  . melatonin  3 mg Per Tube QHS  . midodrine  10 mg Per Tube TID WC  . pantoprazole (PROTONIX) IV  40 mg Intravenous Daily  . QUEtiapine  25 mg Per Tube QHS  . sodium chloride flush  10-40 mL Intracatheter Q12H  . Warfarin - Pharmacist Dosing Inpatient   Does not apply q1600   Continuous Infusions: . sodium chloride 10 mL/hr at 04/13/20 0500  . epinephrine Stopped (04/05/20 0854)  . feeding supplement (VITAL 1.5 CAL) 1,000 mL (04/13/20 0303)  . norepinephrine (LEVOPHED) Adult infusion Stopped (04/11/20 2215)  . vasopressin (PITRESSIN) infusion - *FOR SHOCK* Stopped (04/07/20 0515)   PRN Meds:.sodium chloride, acetaminophen, dextrose, heparin, hydrALAZINE, levalbuterol, ondansetron (ZOFRAN) IV, oxyCODONE, sodium chloride flush, sorbitol, traMADol  ABG    Component Value Date/Time   PHART 7.383 04/07/2020 0802   PCO2ART 45.2 04/07/2020 0802  PO2ART 72 (L) 04/07/2020 0802   HCO3 27.3 04/07/2020 0802   TCO2 29 04/07/2020 0802   ACIDBASEDEF 2.0 04/10/2020 1405   O2SAT 68.3 04/13/2020 0407    A/P  1. Dialysis dependent AKI.  CRRT started 4/13 and stopped 5/1 after coming off pressors. 1. Plan to transition to iHD with first tx on 5/3  2. No heparin with HD. systemic anticoagulation on hold after hemothorax 3. lokelma once today to optimize 2. Hypophosphatemia - secondary to CRRT.  Resolved with repletion. 3. sCHF s/p LVAD 4/8 - optimize volume status with HD 4. Hemothorax s/p VATS 03/28/2020.  5. AFib s/p MAZE and LAA clipping 4/8 6. DM2 - per primary team  7. R MCA ischemic CVA - noted  8. Cardiogenic Shock - off of norepi.  Note on midodrine  9. Anemia - normocytic - no acute  indication for PRBC's.  Hx hemothorax earlier in hospitalization.  Started ESA  - aranesp 40 mcg IV once on 4/27 (Tues).  Increased dose to aranesp 100 mcg for next dose on 5/4 and onward   Claudia Desanctis, MD 04/13/2020 6:09 AM

## 2020-04-13 NOTE — Progress Notes (Signed)
ANTICOAGULATION CONSULT NOTE  Pharmacy Consult for warfarin Indication: post op LVAD  / Aflutter  Allergies  Allergen Reactions  . Liraglutide Nausea And Vomiting  . Lisinopril Cough    Patient Measurements: Height: 6' (182.9 cm) Weight: 98.4 kg (216 lb 14.9 oz) IBW/kg (Calculated) : 77.6 Heparin Dosing Weight: 101.2 kg  Vital Signs: Temp: 97.7 F (36.5 C) (05/02 0851) Temp Source: Axillary (05/02 0851) BP: 104/92 (05/02 0900) Pulse Rate: 94 (05/02 0900)  Labs: Recent Labs    04/11/20 0414 04/11/20 1822 04/12/20 0405 04/12/20 1610 04/13/20 0418  HGB 8.3*  --  7.7*  --  7.8*  HCT 27.0*  --  24.8*  --  25.4*  PLT 137*  --  136*  --  175  LABPROT 24.9*  --  27.0*  --  29.0*  INR 2.3*  --  2.6*  --  2.9*  CREATININE 2.30*   < > 2.12* 1.89* 2.87*   < > = values in this interval not displayed.    Estimated Creatinine Clearance: 29.1 mL/min (A) (by C-G formula based on SCr of 2.87 mg/dL (H)).   Medical History: Past Medical History:  Diagnosis Date  . Abdominal pain 01/05/2020  . Abnormal nuclear stress test 01/14/2015  . Acute on chronic combined systolic and diastolic CHF (congestive heart failure) ([place])   . Acute respiratory failure with hypoxia ([place])   . AKI (acute kidney injury) ([place])   . Anxiety   . Arthritis   . Atrial fibrillation ([place]) 11/29/2016  . Atrial fibrillation with RVR ([place])   . Atrial fibrillation, chronic ([place]) 11/29/2016   Last Assessment & Plan:  Formatting of this note might be different from the original. -continue present medications with warfarin anti-coagulation Last Assessment & Plan:  Formatting of this note might be different from the original. -chronic -anti-coagulated -rate controlled  . Automatic implantable cardioverter-defibrillator in situ 08/18/2011   Formatting of this note might be different from the original. Last Assessment & Plan:  Now followed in ICD clinic. He has followup in January.  Last Assessment & Plan:  Formatting of  this note might be different from the original. -historical note  . Benign prostatic hyperplasia   . Bipolar I disorder with depression ([place]) 01/06/2011   Last Assessment & Plan:  -historical note in chart -continue home medications  Last Assessment & Plan:  Formatting of this note might be different from the original. -historical note in chart -continue home medications  . CAD (coronary artery disease)    a.  1993 s/p MI - [employer];  b. s/p BMS to LAD '00;  c. PTCA 2nd diagonal 2010;  d. 02/18/12 Cath: moderate nonobs dzs - med rx;  e.  01/2015 Cath: LM nl, LAD 40-29m ISR, 70-27m/d, d1 90p (3.0x16 Synergy DES), D2 50-60, LCX nl, OM1 50p, 93m (2.5x12 Synergy DES), RCA nl, EF 30-35%.  . Cardiogenic shock ([place]) 03/21/2020  . Chronic anticoagulation 09/22/2018   Last Assessment & Plan:  -continue warfarin for atrial fibrillation with pharmacy to manage  Last Assessment & Plan:  Formatting of this note might be different from the original. -continue warfarin for atrial fibrillation with pharmacy to manage  . Chronic combined systolic and diastolic CHF (congestive heart failure) ([place])    a. 12/2014 Echo: EF 30-35%, Gr2 DD, mod MR, sev dil LA.  Marland Kitchen Chronic congestive heart failure ([place]) 04/22/2015   Last Assessment & Plan:  Formatting of this note might be different from the original. -known EF 20-25% -continue  medical management -appears stable clinically  Last Assessment & Plan:  Formatting of this note might be different from the original. -noted with abnormal EF and chronic activity restrictions -medically manage  . Chronic systolic dysfunction of left ventricle 07/25/2013  . CKD (chronic kidney disease) 09/22/2018  . CKD (chronic kidney disease), stage III   . Coagulopathy ([place])   . COVID-19 04/17/2019   Last Assessment & Plan:  -Apparent scattered infiltrates on CT chest.  -SARS Co V positive -afebrile and not hypoxic at rest; does qualify for home o2 with sats 88% on room air with activity -home today  on self-quarantine x 14 days -finish azithromycin at home -ask Home Health to check on patient Last Assessment & Plan:  -has persistently positive test since initial verification 04/16/19 -contract/d  . Depression   . Diabetic retinopathy associated with type 2 diabetes mellitus ([place]) 01/30/2020  . Diarrhea 01/05/2020  . Dizziness 09/27/2016  . DM (diabetes mellitus), type 2 ([place]) 03/19/2013   Last Assessment & Plan:  Formatting of this note might be different from the original. -hold metformin -reduce insulin with accuchecks and SSI -diet consult -HbA1c  . DNR (do not resuscitate) discussion 03/18/2013  . ED (erectile dysfunction)   . Elevated troponin 09/22/2018   Last Assessment & Plan:  Formatting of this note might be different from the original. -trend -mild elevation not thought to be ischemic on admission from history  . GERD (gastroesophageal reflux disease)   . Hyperlipidemia   . Hypertension   . ICD (implantable cardiac defibrillator) battery depletion 03/18/2013  . IHD (ischemic heart disease) 01/08/2019   Formatting of this note might be different from the original. Last Assessment & Plan:  Minimal symptoms of angina. He is on Ranexa, isosorbide, and a beta blocker. We'll try to maximize his blood pressure therapy. I stressed the importance of regular daily aerobic exercise. He needs to follow a heart healthy/diabetic diet.  . Ischemic cardiomyopathy    a. s/p [employer] [name] (Atlas) ICD implanted in [place] 2007;  b. 12/2014 Echo: Ef 30-35%.  . Ischemic dilated cardiomyopathy ([place]) 04/09/2020  . LVAD (left ventricular assist device) present ([place]) 03/21/2020  . MVP (mitral valve prolapse)    a. s/p MV annuloplasty at [facility] 2004.  . Non-rheumatic mitral regurgitation 01/08/2019   Formatting of this note might be different from the original. Last Assessment & Plan:  He does have a murmur of mitral insufficiency by exam. We will followup an echocardiogram to assess the success of his mitral valve  repair.  . Other chest pain 06/01/2019   Last Assessment & Plan:  Formatting of this note might be different from the original. -known COVID19 with known CAD and cardiomyopathy -trend troponins -consider other etiologies like cardiac involvement with COVID19 -consult DR Gavin Potters -consider echo (although none available until 6/22) -await CT thorax result  . Palliative care by specialist   . Persistent atrial fibrillation ([place])    a. noted on ICD interrogation '10 - not previously on [place] - CHA2DS2VASc = 5.  . Pneumonia, unspecified organism 09/22/2018  . S/P coronary artery stent placement   . Severe episode of recurrent major depressive disorder, without psychotic features ([place]) 02/07/2020  . Shortness of breath   . Syncope 09/15/2012  . Tick bite 06/02/2019   Last Assessment & Plan:  -reported by ER staff; had tick on umbilicus (removed) -monitor for any signs requiring treatment (I.e. rash, fever)  Last Assessment & Plan:  Formatting of this note might be  different from the original. -reported by ER staff; had tick on umbilicus (removed) -monitor for any signs requiring treatment (I.e. rash, fever)  . TSH elevation   . Type II diabetes mellitus (H[facility])    uncontrolled  . Unstable angina ([place]) 02/19/2012    Medications:  Medications Prior to Admission  Medication Sig Dispense Refill Last Dose  . ALPRAZolam (XANAX) 0.25 MG tablet Take 0.25 mg by mouth 3 (three) times daily as needed for anxiety.    unknown  . amiodarone (PACERONE) 200 MG tablet Take 200 mg by mouth daily.    03/11/2020 at Unknown time  . apixaban (ELIQUIS) 5 MG TABS tablet Take 1 tablet (5 mg total) by mouth 2 (two) times daily. 60 tablet 1 03/11/2020 at 0600  . atorvastatin (LIPITOR) 40 MG tablet Take 1 tablet (40 mg total) by mouth daily at 6 PM. 30 tablet 0 03/10/2020 at Unknown time  . citalopram (CELEXA) 20 MG tablet Take 20 mg by mouth daily.    03/11/2020 at Unknown time  . furosemide (LASIX) 40 MG tablet Take 40 mg by mouth  daily.    03/11/2020 at Unknown time  . insulin aspart (NOVOLOG FLEXPEN) 100 UNIT/ML FlexPen Inject 6 Units into the skin 3 (three) times daily with meals. 15 mL 0 03/11/2020 at Unknown time  . Insulin Glargine (BASAGLAR KWIKPEN) 100 UNIT/ML SOPN Inject 0.5 mLs (50 Units total) into the skin daily. 15 mL 1 03/11/2020 at Unknown time  . metoprolol succinate (TOPROL-XL) 100 MG 24 hr tablet Take 1 tablet (100 mg total) by mouth daily. 90 tablet 3 03/11/2020 at 0600  . nitroGLYCERIN (NITROSTAT) 0.4 MG SL tablet Place 1 tablet (0.4 mg total) under the tongue every 5 (five) minutes x 3 doses as needed for chest pain. 10 tablet 0 unknown  . pantoprazole (PROTONIX) 40 MG tablet Take 1 tablet (40 mg total) by mouth daily. 30 tablet 1 03/11/2020 at Unknown time  . potassium chloride SA (KLOR-CON) 20 MEQ tablet Take 20 mEq by mouth daily.    03/11/2020 at Unknown time  . tamsulosin (FLOMAX) 0.4 MG CAPS capsule Take 1 capsule (0.4 mg total) by mouth daily. 30 capsule 0 03/11/2020 at Unknown time  . ALPRAZolam (XANAX) 1 MG tablet Take 1 tablet (1 mg total) by mouth 2 (two) times daily as needed for anxiety. (Patient not taking: Reported on 02/26/2020) 10 tablet 0 Not Taking at Unknown time    Assessment: 3 YOM with atrial fibrillation on Eliquis PTA s/p R & L heart cath found to have 3v disease s/p  LVAD  Implant 4/8. Warfarin initiated on 4/9 after discussing with MD.  CT on 4/15 now found to have large right MCA stroke. INR had been elevated up to 9.0 postop, he received 1 dose of vitamin K 1 mg IV on 4/13 and again on 4/15 with INR still elevated at 6.2.   Warfarin resumed cautiously on 4/17. Held doses since 4/20 since he went to the OR for VATS. Warfarin was cautiously resumed on 4/23.  INR 2.9, above goal. Hgb stable 7.8, Pltc stable. LDH stable 200s. No bleeding noted. Stop amio 4/29 - chronic Afib rate improved now weaning pressors - warfarin needs may increase  Goal of Therapy:  INR 2-2.5 Monitor  platelets by anticoagulation protocol: Yes   Plan:  -No warfarin today. -Daily INR and CBC  Marguerite Olea, [facility] Clinical Pharmacist Phone [phone]  04/13/2020 9:45 AM      Please check [email or web address]  for unit-specific pharmacist phone numbers

## 2020-04-13 NOTE — Progress Notes (Signed)
Bipap is PRN order.  No distress noted.  Patient on [place] with a O2 sat of 98%.  Will continue to monitor.

## 2020-04-13 NOTE — Progress Notes (Signed)
Patient ID: [name] [name], male   DOB: [birth year], 69 y.o.   MRN: [id number]    Advanced Heart Failure Rounding Note   Subjective:    - S/p HM3 VAD 4/8 w MAZE procedure + LAA clipping. - Extubated 4/9 - 4/12 LVAD speed increased to 5400 --> echo moderate-severe RV dysfunction, septum mildly left shifted, trivial pericardial effusion.  LVAD speed cut back to 5300.  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  - 4/20 developed hemothorax requiring emergent CT placement and eventual VATS - 4/21 EEG done given concerns for posturing and c/w moderate to severe diffuse encephalopathy. No seizures or epileptiform discharges were seen  - 04/03/20 Pump Stop --> controller change out. CT of head unchanged R MCA CVA - 04/06/20 Extubated  Awake, alert and conversant this morning.  Now off CVVH.  CVP 10, co-ox 68%.  MAP 70s-80s.    LDH 267, INR 2.9.   VAD Interrogation  Flow 4.2, Speed 5300, Power 3.7  Pulse Index 4.4. 1 PI event.   VAD interrogated personally. Parameters stable.   Objective:   Weight Range:  Vital Signs:   Temp:  [96.1 F (35.6 C)-98.7 F (37.1 C)] 98.1 F (36.7 C) (05/02 0300) Pulse Rate:  [68-92] 70 (05/02 0700) Resp:  [12-26] 26 (05/02 0700) BP: (87-121)/(57-106) 94/82 (05/02 0700) SpO2:  [89 %-100 %] 94 % (05/02 0700) Arterial Line BP: (96-118)/(62-75) 103/69 (05/02 0700) Weight:  [98.4 kg] 98.4 kg (05/02 0620) Last BM Date: 04/12/20  Weight change: Filed Weights   04/11/20 1352 04/12/20 0525 04/13/20 0620  Weight: 100.7 kg 99.1 kg 98.4 kg    Intake/Output:   Intake/Output Summary (Last 24 hours) at 04/13/2020 0841 Last data filed at 04/13/2020 0700 Gross per 24 hour  Intake 1798.29 ml  Output 721 ml  Net 1077.29 ml    PHYSICAL EXAM: CVP 10 General: Well appearing this am. NAD.  HEENT: Normal. Neck: Supple, JVP 7-8 cm. Carotids OK.  Cardiac:  Mechanical heart sounds with LVAD hum present.  Lungs:  CTAB, normal effort.  Abdomen:  NT, ND,  no HSM. No bruits or masses. +BS  LVAD exit site: Well-healed and incorporated. Dressing dry and intact. No erythema or drainage. Stabilization device present and accurately applied. Driveline dressing changed daily per sterile technique. Extremities:  Warm and dry. No cyanosis, clubbing, rash, or edema.  Neuro:  Alert & oriented x 3. Cranial nerves grossly intact. Left side weaker.     Telemetry: Afib 80s Personally reviewed    Labs: Basic Metabolic Panel: Recent Labs  Lab 04/09/20 0438 04/09/20 1653 04/10/20 0413 04/10/20 1633 04/11/20 0414 04/11/20 0414 04/11/20 1822 04/11/20 1822 04/12/20 0405 04/12/20 1610 04/13/20 0418  NA 138   < > 138   < > 137  --  137  --  138 136 138  K 4.2   < > 4.3   < > 4.7  --  4.5  --  4.6 4.9 5.2*  CL 105   < > 102   < > 102  --  104  --  105 102 106  CO2 21*   < > 28   < > 25  --  25  --  _0 GLUCOSE 117*   < > 137*   < > 120*  --  103*  --  102* 107* 96  BUN 32*   < > 31*   < > 37*  --  36*  --  36* 32* 51*  CREATININE 1.83*   < > 1.88*   < > 2.30*  --  2.16*  --  2.12* 1.89* 2.87*  CALCIUM 8.3*   < > 8.5*   < > 8.2*   < > 8.2*   < > 8.0* 8.0* 7.8*  MG 2.6*  --  2.6*  --  2.4  --   --   --  2.6*  --  2.5*  PHOS 2.3*   < > 2.6   < > 2.5  --  2.8  --  2.8 2.6 2.9   < > = values in this interval not displayed.    Liver Function Tests: Recent Labs  Lab 04/11/20 0414 04/11/20 1822 04/12/20 0405 04/12/20 1610 04/13/20 0418  ALBUMIN 2.1* 2.1* 2.0* 2.1* 2.0*   No results for input(s): LIPASE, AMYLASE in the last 168 hours. No results for input(s): AMMONIA in the last 168 hours.  CBC: Recent Labs  Lab 04/09/20 0438 04/10/20 0413 04/11/20 0414 04/12/20 0405 04/13/20 0418  WBC 8.0 7.5 9.2 9.8 12.1*  HGB 8.1* 8.6* 8.3* 7.7* 7.8*  HCT 25.7* 28.0* 27.0* 24.8* 25.4*  MCV 90.8 92.1 91.2 90.5 92.7  PLT 125* 136* 137* 136* 175    Cardiac Enzymes: No results for input(s): CKTOTAL, CKMB, CKMBINDEX, TROPONINI in the last 168  hours.  BNP: BNP (last 3 results) Recent Labs    03/26/20 2329 04/02/20 2342 04/10/20 0132  BNP 425.7* 332.7* 267.2*    ProBNP (last 3 results) No results for input(s): PROBNP in the last 8760 hours.    Other results:  Imaging: No results found.   Medications:     Scheduled Medications: . aspirin  81 mg Per Tube Daily  . atorvastatin  80 mg Per Tube q1800  . B-complex with vitamin C  1 tablet Per Tube Daily  . bisacodyl  10 mg Oral Daily   Or  . bisacodyl  10 mg Rectal Daily  . chlorhexidine gluconate (MEDLINE KIT)  15 mL Mouth Rinse BID  . Chlorhexidine Gluconate Cloth  6 each Topical Daily  . [START ON 04/15/2020] darbepoetin (ARANESP) injection - NON-DIALYSIS  100 mcg Subcutaneous Q Tue-1800  . feeding supplement (PRO-STAT SUGAR FREE 64)  30 mL Per Tube QID  . influenza vaccine adjuvanted  0.5 mL Intramuscular Tomorrow-1000  . insulin aspart  0-24 Units Subcutaneous Q4H  . insulin aspart  6 Units Subcutaneous Q4H  . insulin glargine  24 Units Subcutaneous BID  . mouth rinse  15 mL Mouth Rinse 10 times per day  . melatonin  3 mg Per Tube QHS  . midodrine  10 mg Per Tube TID WC  . pantoprazole (PROTONIX) IV  40 mg Intravenous Daily  . QUEtiapine  25 mg Per Tube QHS  . sodium chloride flush  10-40 mL Intracatheter Q12H  . Warfarin - Pharmacist Dosing Inpatient   Does not apply q1600    Infusions: . sodium chloride 10 mL/hr at 04/13/20 0700  . epinephrine Stopped (04/05/20 0854)  . feeding supplement (VITAL 1.5 CAL) 1,000 mL (04/13/20 0303)  . norepinephrine (LEVOPHED) Adult infusion Stopped (04/11/20 2215)  . vasopressin (PITRESSIN) infusion - *FOR SHOCK* Stopped (04/07/20 0515)    PRN Medications: sodium chloride, acetaminophen, dextrose, hydrALAZINE, levalbuterol, ondansetron (ZOFRAN) IV, oxyCODONE, sodium chloride flush, sorbitol, traMADol   Assessment/Plan:   1. Acute on chronic systolic HF -> cardiogenic shock-> S/p HM3 LVAD - Echo 2016 EF 30-35% -  Echo 1/21 EF 25% - Admitted with NYHA  IV symptoms and AKI with attempts at diuresis. Initial co-ox 39% - Echo this admit: EF 10-15% moderate RV dysfunction - R/LHC cath 3/21 with severe 3v CAD and low output with CI 1.7.  - Initially planned for CABG but given need for re-do sternotomy, relatively poor targets and longstanding low EF, VAD felt to be better option  - S/p HM3 VAD 4/8 - Speed turned up on 4/11 to  5400, but looking at 4/10 echo there is significant RV dysfunction with leftward septal shift so speed decreased back to 5300 rpm.  - Off milrinone. Off NE. MAP 70s-80s. CO-ox 67% - Now off CVVH, CVP 10 today. Will get first iHD on 5/3.   2. VAD - VAD interrogated personally. Parameters stable. - Had Pump Stop 04/03/20. Controller changed out with old controller sent back to [facility] for evaluation.  - on aspirin 81 mg daily + coumadin.  - LDH 267 - INR 2.9  Discussed dosing with PharmD personally. - Driveline ok   3. CAD with unstable angina - s/p previous PCI. - cath 02/28/2020 with severe 3v CAD - now s/p VAD on 03/13/2020   - No chest pain.   4. AKI on CKD 3a - Nephrology consulted. Started CVVHD 4/13  - Renal US 4/5 unremarkable.  - Hopefully renal function will recover but Renal team not optimistic - He is now off NE w/ stable MAP.  - Off CVVH, iHD to start 5/3.  - K mildly elevated, to get [facility] today.   5. Permanent AF - now s/p MAZE + LAA Clipping 4/8   - Rate controlled - On coumadin. INR 2.9  6. MVP s/p MV repair - On admit with recurrent severe MR on echo and with huge v-waves on PCWP tracing - s/p VAD  7. Hemothoax - s/p CT placement and VATs on 4/22 - CTs pulled 4/26 - stable  8. ID/leukocytosis - 4/11 procalcitonin 1.96  - Started empiric coverage for PNA with vancomycin/cefepime for 7 days. Stopped 4/17 - Given recent VATs and high-dose pressor requirements he was treated with broad spectrum abx, linezolid and cefepime (completed course 4/28)  -  Remains afebrile  9. Acute Hypoxic Respiratory Failure - Re-intubated 4/20 with hemothorax- - Extubated 4/25 - Remains stable on [place]  10. Malnutrition  - Albumin 2.6  - On tube feeds.  - Speech following, still NPO with ice chips, needs to be re-evaluated (did not happen yesterday, may not be until Monday).    11. CVA/anoxic brain injury - 03/27/20 CT large posterior MCA ischemic infarct, possibly from atrial fibrillation.  INR was supratherapeutic when CVA found.  Stable LDH, do not think partial pump thrombosis is the culprit. - Still with some L weakness and inattention but  Improving  - PT/OT/speech following.  - CIR consulted.   - Continue to mobilize, very weak.   12. Anemia, post-op  - Received 2u RBC 04/05/20 - Hgb stable 7.8.   - Transfuse if hemoglobin < 7.5   13. Hyperkalemia - Resolved.  - Continue CVVHD  14. DM2, poorly controlled - hgbA1c 11.1% - continue insulin   Length of Stay: 52   Loralie Champagne, MD  8:41 AM  04/13/2020

## 2020-04-14 ENCOUNTER — Encounter (HOSPITAL_COMMUNITY): Payer: Self-pay | Admitting: Cardiothoracic Surgery

## 2020-04-14 DIAGNOSIS — R570 Cardiogenic shock: Secondary | ICD-10-CM | POA: Diagnosis not present

## 2020-04-14 LAB — CBC
HCT: 23.5 % — ABNORMAL LOW (ref 39.0–52.0)
HCT: 23.4 % — ABNORMAL LOW (ref 39.0–52.0)
Hemoglobin: 7.1 g/dL — ABNORMAL LOW (ref 13.0–17.0)
Hemoglobin: 7.5 g/dL — ABNORMAL LOW (ref 13.0–17.0)
MCH: 27.6 pg (ref 26.0–34.0)
MCH: 28.4 pg (ref 26.0–34.0)
MCHC: 30.2 g/dL (ref 30.0–36.0)
MCHC: 32.1 g/dL (ref 30.0–36.0)
MCV: 91.4 fL (ref 80.0–100.0)
MCV: 88.6 fL (ref 80.0–100.0)
Platelets: 193 10*3/uL (ref 150–400)
Platelets: 196 10*3/uL (ref 150–400)
RBC: 2.57 MIL/uL — ABNORMAL LOW (ref 4.22–5.81)
RBC: 2.64 MIL/uL — ABNORMAL LOW (ref 4.22–5.81)
RDW: 16.1 % — ABNORMAL HIGH (ref 11.5–15.5)
RDW: 15.9 % — ABNORMAL HIGH (ref 11.5–15.5)
WBC: 12 10*3/uL — ABNORMAL HIGH (ref 4.0–10.5)
WBC: 8.7 10*3/uL (ref 4.0–10.5)
nRBC: 0 % (ref 0.0–0.2)
nRBC: 0 % (ref 0.0–0.2)

## 2020-04-14 LAB — PREPARE RBC (CROSSMATCH)

## 2020-04-14 LAB — RENAL FUNCTION PANEL
Albumin: 2.1 g/dL — ABNORMAL LOW (ref 3.5–5.0)
Anion gap: 10 (ref 5–15)
BUN: 96 mg/dL — ABNORMAL HIGH (ref 8–23)
CO2: 25 mmol/L (ref 22–32)
Calcium: 8.2 mg/dL — ABNORMAL LOW (ref 8.9–10.3)
Chloride: 101 mmol/L (ref 98–111)
Creatinine, Ser: 5.52 mg/dL — ABNORMAL HIGH (ref 0.61–1.24)
GFR calc Af Amer: 11 mL/min — ABNORMAL LOW (ref >60)
GFR calc non Af Amer: 10 mL/min — ABNORMAL LOW (ref >60)
Glucose, Bld: 109 mg/dL — ABNORMAL HIGH (ref 70–99)
Phosphorus: 4.2 mg/dL (ref 2.5–4.6)
Potassium: 6.1 mmol/L — ABNORMAL HIGH (ref 3.5–5.1)
Sodium: 136 mmol/L (ref 135–145)

## 2020-04-14 LAB — COOXEMETRY PANEL
Carboxyhemoglobin: 1.7 % — ABNORMAL HIGH (ref 0.5–1.5)
Methemoglobin: 0.6 % (ref 0.0–1.5)
O2 Saturation: 73.2 %
Total hemoglobin: 14.3 g/dL (ref 12.0–16.0)

## 2020-04-14 LAB — GLUCOSE, CAPILLARY
Glucose-Capillary: 103 mg/dL — ABNORMAL HIGH (ref 70–99)
Glucose-Capillary: 117 mg/dL — ABNORMAL HIGH (ref 70–99)
Glucose-Capillary: 126 mg/dL — ABNORMAL HIGH (ref 70–99)
Glucose-Capillary: 133 mg/dL — ABNORMAL HIGH (ref 70–99)
Glucose-Capillary: 152 mg/dL — ABNORMAL HIGH (ref 70–99)
Glucose-Capillary: 155 mg/dL — ABNORMAL HIGH (ref 70–99)

## 2020-04-14 LAB — MAGNESIUM: Magnesium: 2.9 mg/dL — ABNORMAL HIGH (ref 1.7–2.4)

## 2020-04-14 LAB — PROTIME-INR
INR: 2.5 — ABNORMAL HIGH (ref 0.8–1.2)
Prothrombin Time: 25.9 seconds — ABNORMAL HIGH (ref 11.4–15.2)

## 2020-04-14 LAB — LACTATE DEHYDROGENASE: LDH: 271 U/L — ABNORMAL HIGH (ref 98–192)

## 2020-04-14 MED ORDER — HEPARIN SODIUM (PORCINE) 1000 UNIT/ML IJ SOLN
1000.0000 [IU] | Freq: Once | INTRAMUSCULAR | Status: AC
  Administered 2020-04-14: 1000 [IU] via INTRAVENOUS

## 2020-04-14 MED ORDER — SODIUM ZIRCONIUM CYCLOSILICATE 10 G PO PACK
10.0000 g | PACK | Freq: Every day | ORAL | Status: DC
  Administered 2020-04-14 – 2020-04-18 (×5): 10 g via ORAL
  Filled 2020-04-14 (×7): qty 1

## 2020-04-14 MED ORDER — MIDODRINE HCL 5 MG PO TABS
5.0000 mg | ORAL_TABLET | Freq: Three times a day (TID) | ORAL | Status: DC
  Administered 2020-04-15 – 2020-04-19 (×11): 5 mg
  Filled 2020-04-14 (×11): qty 1

## 2020-04-14 MED ORDER — CEFAZOLIN SODIUM-DEXTROSE 2-4 GM/100ML-% IV SOLN
2.0000 g | INTRAVENOUS | Status: DC
  Filled 2020-04-14: qty 100

## 2020-04-14 MED ORDER — SODIUM CHLORIDE 0.9% IV SOLUTION: Freq: Once | INTRAVENOUS | Status: AC

## 2020-04-14 MED ORDER — NEPRO/CARBSTEADY PO LIQD
1000.0000 mL | ORAL | Status: DC
  Administered 2020-04-14 – 2020-05-01 (×13): 1000 mL
  Filled 2020-04-14 (×16): qty 1000

## 2020-04-14 MED ORDER — WARFARIN 0.5 MG HALF TABLET
0.5000 mg | ORAL_TABLET | Freq: Once | ORAL | Status: DC
  Filled 2020-04-14: qty 1

## 2020-04-14 MED ORDER — SODIUM ZIRCONIUM CYCLOSILICATE 10 G PO PACK
10.0000 g | PACK | ORAL | Status: AC
  Administered 2020-04-14: 10 g via ORAL
  Filled 2020-04-14: qty 1

## 2020-04-14 MED ORDER — PRO-STAT SUGAR FREE PO LIQD
30.0000 mL | Freq: Two times a day (BID) | ORAL | Status: DC
  Administered 2020-04-14 – 2020-05-01 (×29): 30 mL
  Filled 2020-04-14 (×28): qty 30

## 2020-04-14 NOTE — Progress Notes (Signed)
Nutrition Follow-up  DOCUMENTATION CODES:   Not applicable  INTERVENTION:   Tube Feeding:  Change to Nepro at 50 ml/hr Pro-Stat 30 mL BID Provides 2360 kcals, 127 g of protein and 876 mL of free water Meets 100% estimated calorie and protein needs   NUTRITION DIAGNOSIS:   Increased nutrient needs related to post-op healing, chronic illness as evidenced by estimated needs.  Being addressed via TF   GOAL:   Patient will meet greater than or equal to 90% of their needs  Met via TF   MONITOR:   PO intake, Supplement acceptance, Labs, Weight trends  REASON FOR ASSESSMENT:   Consult LVAD Eval  ASSESSMENT:   69 yo male admitted with acute on chronic CHF, AKI on CKD 3. Initial plan for CABG but with re-do sternotomy, poor targets and longstanding low EF plan for LVAD instead. PMH includes CAD, CKD, DM  3/31 Admit, Cath with severe 3v CAD 4/03 Swan placed  4/06 Teeth extraction 4/08 HeartMate 3 LVAD placed, re-do sternotomy, Intubated 4/09 Extubated 4/13 CRRT initiated 4/14 Cortrak placed, TF started 4/15 Head CT: right [facility] CVA 4/19 CRRT stopped, respiratory arrest requiring intubation 4/20 Hemothorax requiring emergent chest tube placement, VATS, CRRT re-initiated 4/21 Cortrak advanced to proximal duodenum; Concern for posturing, EEG  4/25 Extubated 4/26 Cortrak repositioned 5/01 CRRT discontinued 5/03 1st iHD  1st iHD today, uremic  NPO, SLP following. Pt remains lethargic/confused, does not actively participate in eating and drinking. Minimal interaction with PT  Vital 1.5 at 50 ml/hr, Pro-Stat 30 mL QID via Cortrak  Current wt 103.8 kg; lowest weight of 98.4 kg   Labs: potassium 6.1 (H) phosphorus 4.2 (wdl) Meds: B-complex with C, aranesp, ss novolog, lantus  Diet Order:   Diet Order            Diet NPO time specified Except for: Ice Chips, Other (See Comments)  Diet effective now              EDUCATION NEEDS:   Education needs have been  addressed  Skin:  Skin Assessment: Reviewed RN Assessment  Last BM:  5/3 rectal tube  Height:   Ht Readings from Last 1 Encounters:  03/27/2020 6' (1.829 m)    Weight:   Wt Readings from Last 1 Encounters:  04/14/20 103.8 kg   BMI:  Body mass index is 31.04 kg/m.  Estimated Nutritional Needs:   Kcal:  2300-2500 kcals  Protein:  120-140g  Fluid:  1000 mL plus UOP   Kerman Passey MS, RDN, LDN, CNSC RD Pager Number and RD On-Call Pager Number Located in [place]

## 2020-04-14 NOTE — Progress Notes (Signed)
LVAD Coordinator Rounding Note:  HM III LVAD implanted on 03/15/2020 with MAZE procedure + LAA clipping  by Dr Orvan Seen under destination therapy criteria due to advanced age. Primary Heart Failure Cardiologist is Dr. Haroldine Laws.   Pt difficult to arouse this morning; lethargic. Does not interact at all, does not follow commands. Remains anuric. Uremic. Plan is for 1st HD today.  Vital signs: Temp: 98.1 HR: 89 - afib A line: 126/71 (85)  Cuff: 118/92 (101) O2 Sat: 98% on 2L Wt: 253.3>262.3>260.8>259.9>237.8>...>235>233.5>220.2>220.4>219.5>218.2>222.4 lbs   LVAD interrogation reveals:  Speed: 5300 Flow: 3.9 Power: 3.6w  PI: 4.5  Alarms: none  Events: none Hematocrit: 23  Fixed speed: 5300 Low speed limit: 5000  CRRT: keeping even  Drive Line: Existing VAD dressing removed and site care performed using sterile technique. Drive line exit site cleaned with Chlora prep applicators x 2, allowed to dry, and gauze dressing with silver strip re-applied. Suture intact. Exit site healing and unincorporated, the velour is fully implanted at exit site. Slight redness, tenderness, small amount of sangineous drainage, no foul odor or rash noted. Drive line anchor secure.  Dressing changes twice weekly (Monday / Thursday) dressing changes per nurse champion or VAD coordinator.  Next dressing change due 04/17/20.         Labs:  LDH trend: 360>513>482>467>453>454>474>428>415>422>349>305>279>283>273>259>243>262>271  INR trend: 1.3>6.2>9>4.4>6.2>2.2>2.4>2.2>2.0>2.2>2.3>3.4>2.4>1.8>2.8>3.1>2.4>2.3>2.5  CR: 3.57>3.98>3.18>2.44>2.41>2.0>2.1>1.96>3.29>4.3>3.6>2.53>2.25>2.16>1.88>1.83>1.88>2.3>5.52  Anticoagulation Plan: -INR Goal: 2.0-2.5 -ASA Dose: 81 mg   Respiratory: - extubated 4/9/2 - re-intubated 03/31/2020 s/p left hemothorax with cardiac arrest  Nitric Oxide: off 03/23/20  Blood Products:  Intraop: 6 FFP  DDAVP  420 cell saver  04/03/2020>>4 FFP 03/21/20>> 1 RBC 03/22/20>>1  RBC  Post-op: 03/15/2020>>3 PC's, 2 FFP  Intra-op: 03/29/2020>>2 PCs      2 FFP  04/05/20: 2 PRBCs  Cultures:  -03/25/20- blood cultures>>negative - 04/08/2020-blood cultures>>negative  Device: -St [name] -Therapies: VF 194 - therapy on  Drips: - Milrinone 0.125 mcg/kg/min >> off 04/09/20 - Levo 1 mcg/min>> off 04/10/20 - Amiodarone 30 mg/hr-off - Tube feed: 50 mL/hr   Adverse Events on VAD: >>03/25/20 CVVH Started >>04/06/2020 left hemothorax; cardiac arrest  Pt Education:  1. Pt not appropriate for teaching at this time. Does not follow commands. 2. No family at bedside.  Plan/Recommendations:  1. Call VAD coordinator for any equipment or drive line issues.  2. Dressing changes Monday / Thursday per nurse champion or VAD coordinator.   Tanda Rockers RN [place] Coordinator  Office: [phone]  24/7 Pager: [phone]

## 2020-04-14 NOTE — Progress Notes (Addendum)
Patient ID: [name] [name], male   DOB: November 07, 1951, 69 y.o.   MRN: [id number] S: Confused this morning. O:BP (!) 118/92   Pulse 89   Temp 98.1 F (36.7 C)   Resp 20   Ht 6' (1.829 m)   Wt 100.9 kg   SpO2 98%   BMI 30.17 kg/m   Intake/Output Summary (Last 24 hours) at 04/14/2020 0856 Last data filed at 04/14/2020 0700 Gross per 24 hour  Intake 1689.09 ml  Output --  Net 1689.09 ml   Intake/Output: I/O last 3 completed shifts: In: 2687.5 [I.V.:357.5; NG/GT:2330] Out: 0   Intake/Output this shift:  No intake/output data recorded. Weight change: 2.5 kg Gen: somnolent but arousable CVS: rrr, no rub Resp: occ rhonchi Abd: +BS, soft, NT Ext: no edema  Recent Labs  Lab 04/10/20 1633 04/11/20 0414 04/11/20 1822 04/12/20 0405 04/12/20 1610 04/13/20 0418 04/14/20 0440  NA 137 137 137 138 136 138 136  K 4.7 4.7 4.5 4.6 4.9 5.2* 6.1*  CL 104 102 104 105 102 106 101  CO2 24 25 25 25 24 25 25   GLUCOSE 137* 120* 103* 102* 107* 96 109*  BUN 32* 37* 36* 36* 32* 51* 96*  CREATININE 2.03* 2.30* 2.16* 2.12* 1.89* 2.87* 5.52*  ALBUMIN 2.3* 2.1* 2.1* 2.0* 2.1* 2.0* 2.1*  CALCIUM 8.4* 8.2* 8.2* 8.0* 8.0* 7.8* 8.2*  PHOS 2.6 2.5 2.8 2.8 2.6 2.9 4.2   Liver Function Tests: Recent Labs  Lab 04/12/20 1610 04/13/20 0418 04/14/20 0440  ALBUMIN 2.1* 2.0* 2.1*   No results for input(s): LIPASE, AMYLASE in the last 168 hours. No results for input(s): AMMONIA in the last 168 hours. CBC: Recent Labs  Lab 04/10/20 0413 04/10/20 0413 04/11/20 0414 04/11/20 0414 04/12/20 0405 04/13/20 0418 04/14/20 0440  WBC 7.5   < > 9.2   < > 9.8 12.1* 12.0*  HGB 8.6*   < > 8.3*   < > 7.7* 7.8* 7.1*  HCT 28.0*   < > 27.0*   < > 24.8* 25.4* 23.5*  MCV 92.1  --  91.2  --  90.5 92.7 91.4  PLT 136*   < > 137*   < > 136* 175 193   < > = values in this interval not displayed.   Cardiac Enzymes: No results for input(s): CKTOTAL, CKMB, CKMBINDEX, TROPONINI in the last 168 hours. CBG: Recent Labs   Lab 04/13/20 1609 04/13/20 1957 04/13/20 2347 04/14/20 0412 04/14/20 0751  GLUCAP 112* 158* 130* 103* 117*    Iron Studies: No results for input(s): IRON, TIBC, TRANSFERRIN, FERRITIN in the last 72 hours. Studies/Results: No results found. Marland Kitchen aspirin  81 mg Per Tube Daily  . atorvastatin  80 mg Per Tube q1800  . B-complex with vitamin C  1 tablet Per Tube Daily  . bisacodyl  10 mg Oral Daily   Or  . bisacodyl  10 mg Rectal Daily  . chlorhexidine gluconate (MEDLINE KIT)  15 mL Mouth Rinse BID  . Chlorhexidine Gluconate Cloth  6 each Topical Daily  . Chlorhexidine Gluconate Cloth  6 each Topical Q0600  . [START ON 04/15/2020] darbepoetin (ARANESP) injection - NON-DIALYSIS  100 mcg Subcutaneous Q Tue-1800  . feeding supplement (PRO-STAT SUGAR FREE 64)  30 mL Per Tube QID  . influenza vaccine adjuvanted  0.5 mL Intramuscular Tomorrow-1000  . insulin aspart  0-24 Units Subcutaneous Q4H  . insulin aspart  6 Units Subcutaneous Q4H  . insulin glargine  24 Units Subcutaneous BID  .  mouth rinse  15 mL Mouth Rinse 10 times per day  . melatonin  3 mg Per Tube QHS  . midodrine  10 mg Per Tube TID WC  . pantoprazole (PROTONIX) IV  40 mg Intravenous Daily  . QUEtiapine  25 mg Per Tube QHS  . sodium chloride flush  10-40 mL Intracatheter Q12H  . Warfarin - Pharmacist Dosing Inpatient   Does not apply q1600    BMET    Component Value Date/Time   NA 136 04/14/2020 0440   NA 132 (L) 03/07/2020 1501   K 6.1 (H) 04/14/2020 0440   CL 101 04/14/2020 0440   CO2 25 04/14/2020 0440   GLUCOSE 109 (H) 04/14/2020 0440   BUN 96 (H) 04/14/2020 0440   BUN 16 03/07/2020 1501   CREATININE 5.52 (H) 04/14/2020 0440   CREATININE 1.29 (H) 12/10/2016 1011   CALCIUM 8.2 (L) 04/14/2020 0440   GFRNONAA 10 (L) 04/14/2020 0440   GFRNONAA 57 (L) 01/28/2015 1558   GFRAA 11 (L) 04/14/2020 0440   GFRAA 66 01/28/2015 1558   CBC    Component Value Date/Time   WBC 12.0 (H) 04/14/2020 0440   RBC 2.57 (L)  04/14/2020 0440   HGB 7.1 (L) 04/14/2020 0440   HGB 15.8 04/24/2018 1148   HCT 23.5 (L) 04/14/2020 0440   HCT 45.9 04/24/2018 1148   PLT 193 04/14/2020 0440   PLT 281 04/24/2018 1148   MCV 91.4 04/14/2020 0440   MCV 89 04/24/2018 1148   MCH 27.6 04/14/2020 0440   MCHC 30.2 04/14/2020 0440   RDW 16.1 (H) 04/14/2020 0440   RDW 14.2 04/24/2018 1148   LYMPHSABS 0.4 (L) 04/02/2020 0429   LYMPHSABS 1.1 04/24/2018 1148   MONOABS 2.2 (H) 04/02/2020 0429   EOSABS 0.0 04/02/2020 0429   EOSABS 0.2 04/24/2018 1148   BASOSABS 0.1 04/02/2020 0429   BASOSABS 0.1 04/24/2018 1148     Assessment/Plan:  1. AKI, in setting of cardiogenic shock, pressors, s/p LVAD 03/26/2020.  CRRT initiated on 03/25/20 and stopped 04/12/20 since he is off of pressors.  For first intermittent HD today.  No heparin with HD on systemic anticoagulation 2. Hyperkalemia- for HD today 3. Vascular access- will need tunneled HD catheter and removal of temp HD cath as it has been in place for more than 2 weeks.  Will consult IR today for placement tomorrow. 4. sCHF s/p LVAD 03/26/2020 5. A fib s/p MAZE and LAA clipping 4/8 6. R MCA ischemic stroke- large, seen on CT on 03/27/20. 7. Left hemothorax s/p VATS 03/15/2020. 8. Anemia- plan for transfusion with HD today. 9. MVP s/p MV repair  Donetta Potts, MD [facility] [phone]

## 2020-04-14 NOTE — Progress Notes (Signed)
CPT not performed at this time due to patient receiving dialysis.

## 2020-04-14 NOTE — Progress Notes (Signed)
Patient ID: [name] [name], male   DOB: 07/12/1951, 69 y.o.   MRN: [id number]  This NP visited patient at the bedside as a follow up for palliative medicine needs and emotional support.  Patient is weak and lethargic today.  Minimal conversation.  Spoke by phone with with [name]/ step daughter/main support person here in [place] for ongoing discussion regarding GOCs.  I then was able to speak to the patient's biological daughter/Bobbie Jimmye Norman who lives in [place] for conversation and education regarding current medical situation, and emotional support.  Both Mortimer Fries and [name] verbalized an understanding of the seriousness of the patient's current medical situation.   Bobbie Williams/daughter does have a document stating that she is the healthcare power of attorney.  However this document is witnessed by Reatha Harps herself making the document  Invalid.  Also the document contradicts what the patient communicated to myself, LCSW/LVAD team and his family;   he would  not want aggressive interventions if return to meaningful recovery is unlikely, "I don't ever want to be a burden"  She tells me that she and [name] work together well for the best interest of the patient and at any time information and decisions can be made through both herself or Laurin, preferably together.    Family remains hopeful for improvement and are open to all offered and available medical interventions to prolong life "for now taking it one day at a time"     Discussed with family  the importance of continued conversation with her family and the  medical providers regarding overall plan of care and treatment options,  ensuring decisions are within the context of the patients values and GOCs.  Questions and concerns addressed    Emotional support offered    Total time spent on the unit was 35 minutes    Discussed with [name] LCSW  Greater than 50% of the time was spent in counseling and  coordination of care    Wadie Lessen NP  Palliative Medicine Team Team Phone # [phone] Pager [phone]

## 2020-04-14 NOTE — Consult Note (Signed)
Chief Complaint: Patient was seen in consultation today for renal failure  Referring Physician(s): Dr. Marval Regal  Supervising Physician: Arne Cleveland  Patient Status: [facility] - In-pt  History of Present Illness: [name] [name] is a 69 y.o. male with past medical history of ischemic cardiomyopathy who initially presented for LVAD implantation and left atrial appendage clipping with sternal reconstruction on 4/8.  Post op course complicated by cardiogenic shock, CRRT initiation 4/13, R MCA stroke 4/15.  Now with intermittent somnolence, decreased participation in care. He currently has a R IJ temp catheter in place.  Plans to trial IHD today and monitor pressor need.  IF tolerates, will need a tunneled HD catheter placed.  IR consulted for possible tunneled dialysis catheter placement.   Patient assessed at bedside.  On TF via NGT.  Does not awaken or participate in assessment.  RN at bedside for updates.  Wife will need to be contacted for consent.   Past Medical History:  Diagnosis Date  . Abdominal pain 01/05/2020  . Abnormal nuclear stress test 01/14/2015  . Acute on chronic combined systolic and diastolic CHF (congestive heart failure) ([place])   . Acute respiratory failure with hypoxia ([place])   . AKI (acute kidney injury) ([place])   . Anxiety   . Arthritis   . Atrial fibrillation ([place]) 11/29/2016  . Atrial fibrillation with RVR ([place])   . Atrial fibrillation, chronic ([place]) 11/29/2016   Last Assessment & Plan:  Formatting of this note might be different from the original. -continue present medications with warfarin anti-coagulation Last Assessment & Plan:  Formatting of this note might be different from the original. -chronic -anti-coagulated -rate controlled  . Automatic implantable cardioverter-defibrillator in situ 08/18/2011   Formatting of this note might be different from the original. Last Assessment & Plan:  Now followed in ICD clinic. He has followup in January.  Last  Assessment & Plan:  Formatting of this note might be different from the original. -historical note  . Benign prostatic hyperplasia   . Bipolar I disorder with depression ([place]) 01/06/2011   Last Assessment & Plan:  -historical note in chart -continue home medications  Last Assessment & Plan:  Formatting of this note might be different from the original. -historical note in chart -continue home medications  . CAD (coronary artery disease)    a.  1993 s/p MI - [employer];  b. s/p BMS to LAD '00;  c. PTCA 2nd diagonal 2010;  d. 02/18/12 Cath: moderate nonobs dzs - med rx;  e.  01/2015 Cath: LM nl, LAD 40-30m ISR, 70-39m/d, d1 90p (3.0x16 Synergy DES), D2 50-60, LCX nl, OM1 50p, 58m (2.5x12 Synergy DES), RCA nl, EF 30-35%.  . Cardiogenic shock ([place]) 03/21/2020  . Chronic anticoagulation 09/22/2018   Last Assessment & Plan:  -continue warfarin for atrial fibrillation with pharmacy to manage  Last Assessment & Plan:  Formatting of this note might be different from the original. -continue warfarin for atrial fibrillation with pharmacy to manage  . Chronic combined systolic and diastolic CHF (congestive heart failure) ([place])    a. 12/2014 Echo: EF 30-35%, Gr2 DD, mod MR, sev dil LA.  Marland Kitchen Chronic congestive heart failure ([place]) 04/22/2015   Last Assessment & Plan:  Formatting of this note might be different from the original. -known EF 20-25% -continue medical management -appears stable clinically  Last Assessment & Plan:  Formatting of this note might be different from the original. -noted with abnormal EF and chronic activity restrictions -medically manage  .  Chronic systolic dysfunction of left ventricle 07/25/2013  . CKD (chronic kidney disease) 09/22/2018  . CKD (chronic kidney disease), stage III   . Coagulopathy ([place])   . COVID-19 04/17/2019   Last Assessment & Plan:  -Apparent scattered infiltrates on CT chest.  -SARS Co V positive -afebrile and not hypoxic at rest; does qualify for home o2 with sats 88% on  room air with activity -home today on self-quarantine x 14 days -finish azithromycin at home -ask Home Health to check on patient Last Assessment & Plan:  -has persistently positive test since initial verification 04/16/19 -contract/d  . Depression   . Diabetic retinopathy associated with type 2 diabetes mellitus ([place]) 01/30/2020  . Diarrhea 01/05/2020  . Dizziness 09/27/2016  . DM (diabetes mellitus), type 2 ([place]) 03/19/2013   Last Assessment & Plan:  Formatting of this note might be different from the original. -hold metformin -reduce insulin with accuchecks and SSI -diet consult -HbA1c  . DNR (do not resuscitate) discussion 03/18/2013  . ED (erectile dysfunction)   . Elevated troponin 09/22/2018   Last Assessment & Plan:  Formatting of this note might be different from the original. -trend -mild elevation not thought to be ischemic on admission from history  . GERD (gastroesophageal reflux disease)   . Hyperlipidemia   . Hypertension   . ICD (implantable cardiac defibrillator) battery depletion 03/18/2013  . IHD (ischemic heart disease) 01/08/2019   Formatting of this note might be different from the original. Last Assessment & Plan:  Minimal symptoms of angina. He is on Ranexa, isosorbide, and a beta blocker. We'll try to maximize his blood pressure therapy. I stressed the importance of regular daily aerobic exercise. He needs to follow a heart healthy/diabetic diet.  . Ischemic cardiomyopathy    a. s/p St. [name] (Atlas) ICD implanted in [place] 2007;  b. 12/2014 Echo: Ef 30-35%.  . Ischemic dilated cardiomyopathy ([place]) 03/31/2020  . LVAD (left ventricular assist device) present ([place]) 03/21/2020  . MVP (mitral valve prolapse)    a. s/p MV annuloplasty at [facility] 2004.  . Non-rheumatic mitral regurgitation 01/08/2019   Formatting of this note might be different from the original. Last Assessment & Plan:  He does have a murmur of mitral insufficiency by exam. We will followup an echocardiogram to  assess the success of his mitral valve repair.  . Other chest pain 06/01/2019   Last Assessment & Plan:  Formatting of this note might be different from the original. -known COVID19 with known CAD and cardiomyopathy -trend troponins -consider other etiologies like cardiac involvement with COVID19 -consult DR Gavin Potters -consider echo (although none available until 6/22) -await CT thorax result  . Palliative care by specialist   . Persistent atrial fibrillation ([place])    a. noted on ICD interrogation '10 - not previously on [place] - CHA2DS2VASc = 5.  . Pneumonia, unspecified organism 09/22/2018  . S/P coronary artery stent placement   . Severe episode of recurrent major depressive disorder, without psychotic features ([place]) 02/07/2020  . Shortness of breath   . Syncope 09/15/2012  . Tick bite 06/02/2019   Last Assessment & Plan:  -reported by ER staff; had tick on umbilicus (removed) -monitor for any signs requiring treatment (I.e. rash, fever)  Last Assessment & Plan:  Formatting of this note might be different from the original. -reported by ER staff; had tick on umbilicus (removed) -monitor for any signs requiring treatment (I.e. rash, fever)  . TSH elevation   . Type II diabetes mellitus (  [place])    uncontrolled  . Unstable angina ([place]) 02/19/2012    Past Surgical History:  Procedure Laterality Date  . CARDIAC DEFIBRILLATOR PLACEMENT  2007   implanted in [place], has a 7001 RV lead and a SJM Atlas ICD followed by Dr Caryl Comes  . CARDIOVERSION N/A 09/29/2016   Procedure: CARDIOVERSION;  Surgeon: Lelon Perla, MD;  [facility] END[facility]COPY;  Service: Cardiovascular;  Laterality: N/A;  . CARDIOVERSION N/A 12/03/2016   Procedure: CARDIOVERSION;  Surgeon: Dorothy Spark, MD;  Location: [place];  Service: Cardiovascular;  Laterality: N/A;  . CLIPPING OF ATRIAL APPENDAGE Left 03/14/2020   Procedure: Clipping Of Atrial Appendage;  Surgeon: Wonda Olds, MD;  Location: [place];  Service: Open Heart  Surgery;  Laterality: Left;  . HEMATOMA EVACUATION Left 03/22/2020   Procedure: Evacuation Hematoma;  Surgeon: Prescott Gum, Collier Salina, MD;  Location: [place];  Service: Thoracic;  Laterality: Left;  . IMPLANTABLE CARDIOVERTER DEFIBRILLATOR (ICD) GENERATOR CHANGE N/A 03/19/2013   SJM Fortify ST DR generator placed by Dr Lovena Le, part of Analyze ST study  . INSERTION OF IMPLANTABLE LEFT VENTRICULAR ASSIST DEVICE N/A 04/07/2020   Procedure: INSERTION OF IMPLANTABLE LEFT VENTRICULAR ASSIST DEVICE - HM3;  Surgeon: Wonda Olds, MD;  Location: [place];  Service: Open Heart Surgery;  Laterality: N/A;  . LEFT AND RIGHT HEART CATHETERIZATION WITH CORONARY ANGIOGRAM N/A 01/14/2015   Procedure: LEFT AND RIGHT HEART CATHETERIZATION WITH CORONARY ANGIOGRAM;  Surgeon: Peter M Martinique, MD;  Location: [facility] CATH LAB;  Service: Cardiovascular;  Laterality: N/A;  . LEFT HEART CATHETERIZATION WITH CORONARY ANGIOGRAM N/A 02/17/2012   Procedure: LEFT HEART CATHETERIZATION WITH CORONARY ANGIOGRAM;  Surgeon: Jolaine Artist, MD;  Location: [facility] CATH LAB;  Service: Cardiovascular;  Laterality: N/A;  . MAZE N/A 03/30/2020   Procedure: MAZE;  Surgeon: Wonda Olds, MD;  Location: [place];  Service: Open Heart Surgery;  Laterality: N/A;  . MITRAL VALVE ANNULOPLASTY  2004   Archie Endo 02/17/2012  . MULTIPLE EXTRACTIONS WITH ALVEOLOPLASTY N/A 04/08/2020   Procedure: Extraction of tooth #'s 20,21,22 and 23 with alveoloplasaty;  Surgeon: Lenn Cal, DDS;  Location: [place];  Service: Oral Surgery;  Laterality: N/A;  . RIGHT/LEFT HEART CATH AND CORONARY ANGIOGRAPHY N/A 03/04/2020   Procedure: RIGHT/LEFT HEART CATH AND CORONARY ANGIOGRAPHY;  Surgeon: Burnell Blanks, MD;  Location: [place] CV LAB;  Service: Cardiovascular;  Laterality: N/A;  . TEE WITHOUT CARDIOVERSION N/A 09/29/2016   Procedure: TRANSESOPHAGEAL ECHOCARDIOGRAM (TEE);  Surgeon: Lelon Perla, MD;  Location: [facility] ENDOSCOPY;  Service: Cardiovascular;  Laterality: N/A;  .  TEE WITHOUT CARDIOVERSION N/A 12/03/2016   Procedure: TRANSESOPHAGEAL ECHOCARDIOGRAM (TEE);  Surgeon: Dorothy Spark, MD;  Location: [place];  Service: Cardiovascular;  Laterality: N/A;  . TEE WITHOUT CARDIOVERSION N/A 04/11/2020   Procedure: TRANSESOPHAGEAL ECHOCARDIOGRAM (TEE);  Surgeon: Wonda Olds, MD;  Location: [place];  Service: Open Heart Surgery;  Laterality: N/A;  . TEE WITHOUT CARDIOVERSION N/A 03/24/2020   Procedure: TRANSESOPHAGEAL ECHOCARDIOGRAM (TEE);  Surgeon: Prescott Gum, Collier Salina, MD;  Location: [place];  Service: Thoracic;  Laterality: N/A;  . VIDEO ASSISTED THORACOSCOPY Left 03/29/2020   Procedure: VIDEO ASSISTED THORACOSCOPY;  Surgeon: Ivin Poot, MD;  Location: [place];  Service: Thoracic;  Laterality: Left;    Allergies: Liraglutide and Lisinopril  Medications: Prior to Admission medications   Medication Sig Start Date End Date Taking? Authorizing Provider  ALPRAZolam (XANAX) 0.25 MG tablet Take 0.25 mg by mouth 3 (three) times daily  as needed for anxiety.  02/07/20  Yes [provider]  amiodarone (PACERONE) 200 MG tablet Take 200 mg by mouth daily.  02/18/20  Yes [provider]  apixaban (ELIQUIS) 5 MG TABS tablet Take 1 tablet (5 mg total) by mouth 2 (two) times daily. 01/08/20  Yes Eugenie Filler, MD  atorvastatin (LIPITOR) 40 MG tablet Take 1 tablet (40 mg total) by mouth daily at 6 PM. 01/08/20  Yes Eugenie Filler, MD  citalopram (CELEXA) 20 MG tablet Take 20 mg by mouth daily.  02/07/20  Yes [provider]  furosemide (LASIX) 40 MG tablet Take 40 mg by mouth daily.  02/01/20  Yes [provider]  insulin aspart (NOVOLOG FLEXPEN) 100 UNIT/ML FlexPen Inject 6 Units into the skin 3 (three) times daily with meals. 01/08/20  Yes Eugenie Filler, MD  Insulin Glargine (BASAGLAR KWIKPEN) 100 UNIT/ML SOPN Inject 0.5 mLs (50 Units total) into the skin daily. 01/08/20  Yes Eugenie Filler, MD  metoprolol succinate (TOPROL-XL)  100 MG 24 hr tablet Take 1 tablet (100 mg total) by mouth daily. 03/07/20  Yes Revankar, Reita Cliche, MD  nitroGLYCERIN (NITROSTAT) 0.4 MG SL tablet Place 1 tablet (0.4 mg total) under the tongue every 5 (five) minutes x 3 doses as needed for chest pain. 01/08/20  Yes Eugenie Filler, MD  pantoprazole (PROTONIX) 40 MG tablet Take 1 tablet (40 mg total) by mouth daily. 01/09/20  Yes Eugenie Filler, MD  potassium chloride SA (KLOR-CON) 20 MEQ tablet Take 20 mEq by mouth daily.  02/01/20  Yes [provider]  tamsulosin (FLOMAX) 0.4 MG CAPS capsule Take 1 capsule (0.4 mg total) by mouth daily. 01/08/20  Yes Eugenie Filler, MD  ALPRAZolam Duanne Moron) 1 MG tablet Take 1 tablet (1 mg total) by mouth 2 (two) times daily as needed for anxiety. Patient not taking: Reported on 02/23/2020 01/08/20   Eugenie Filler, MD     Family History  Problem Relation Age of Onset  . Coronary artery disease Mother   . Colon cancer Mother   . Heart attack Mother   . Heart disease Mother   . Heart failure Mother   . Hypertension Mother   . Malignant hyperthermia Mother   . Colon cancer Sister   . Coronary artery disease Brother   . Heart disease Brother     Social History   Socioeconomic History  . Marital status: Married    Spouse name: Not on file  . Number of children: 8  . Years of education: Not on file  . Highest education level: Not on file  Occupational History  . Occupation: Community education officer    Comment: disabled  Tobacco Use  . Smoking status: Never Smoker  . Smokeless tobacco: Never Used  Substance and Sexual Activity  . Alcohol use: Yes    Alcohol/week: 0.0 standard drinks    Comment: 01/14/2015 "used to drink alot; stopped completely in 1983"  . Drug use: No    Comment: 01/14/2015 "used to use about anything I could get; stopped completely in 1983"  . Sexual activity: Not Currently  Other Topics Concern  . Not on file  Social History Narrative  . Not on file   Social Determinants of  Health   Financial Resource Strain:   . Difficulty of Paying Living Expenses:   Food Insecurity:   . Worried About Charity fundraiser in the Last Year:   . [place] in the Last  Year:   Transportation Needs:   . Film/video editor (Medical):   Marland Kitchen Lack of Transportation (Non-Medical):   Physical Activity:   . Days of Exercise per Week:   . Minutes of Exercise per Session:   Stress:   . Feeling of Stress :   Social Connections:   . Frequency of Communication with Friends and Family:   . Frequency of Social Gatherings with Friends and Family:   . Attends Religious Services:   . Active Member of Clubs or Organizations:   . Attends Archivist Meetings:   Marland Kitchen Marital Status:      Review of Systems: A 12 point ROS discussed and pertinent positives are indicated in the HPI above.  All other systems are negative.  Review of Systems  Unable to perform ROS: Mental status change    Vital Signs: BP 117/89   Pulse 89   Temp 98.1 F (36.7 C)   Resp 19   Ht 6' (1.829 m)   Wt 222 lb 7.1 oz (100.9 kg)   SpO2 95%   BMI 30.17 kg/m   Physical Exam Vitals and nursing note reviewed.  Constitutional:      General: He is not in acute distress.    Appearance: He is ill-appearing.     Comments: Not alert  HENT:     Nose:     Comments: NGT in place, old dried blood noted.    Mouth/Throat:     Mouth: Mucous membranes are moist.     Pharynx: Oropharynx is clear.  Neck:     Comments: R IJ temp cath in place Cardiovascular:     Rate and Rhythm: Normal rate and regular rhythm.  Pulmonary:     Effort: Pulmonary effort is normal. No respiratory distress.     Breath sounds: Normal breath sounds.  Skin:    General: Skin is warm and dry.  Neurological:     General: No focal deficit present.     Mental Status: He is oriented to person, place, and time. Mental status is at baseline.  Psychiatric:        Mood and Affect: Mood normal.        Behavior: Behavior normal.         Thought Content: Thought content normal.        Judgment: Judgment normal.      MD Evaluation Airway: WNL Heart: WNL Abdomen: WNL Chest/ Lungs: WNL ASA  Classification: 3 Mallampati/Airway Score: Three   Imaging: EEG  Result Date: 04/02/2020 Lora Havens, MD     04/02/2020  2:16 PM Patient Name: URIYAH RASKA MRN: [id number] Epilepsy Attending: Lora Havens Referring Physician/Provider: Dr Glori Bickers Date: 04/02/2020 Duration: 24.48 mins Patient history: 69 yo M with Large posterior right MCA infarct and was noted to have posturing. EEG to evaluate for seizure. Level of alertness: comatose AEDs during EEG study: None Technical aspects: This EEG study was done with scalp electrodes positioned according to the 10-20 International system of electrode placement. Electrical activity was acquired at a sampling rate of 500Hz  and reviewed with a high frequency filter of 70Hz  and a low frequency filter of 1Hz . EEG data were recorded continuously and digitally stored. DESCRIPTION:  EEG showed continuous generalized polymorphic 3-6hz  theta-delta slowing. Hyperventilation and photic stimulation were not performed. ABNORMALITY - Continuous slow, generalized IMPRESSION: This study is suggestive of moderate to severe diffuse encephalopathy, non specific to etiology. No seizures or epileptiform discharges were seen throughout the recording. Priyanka  Barbra Sarks   CT ABDOMEN PELVIS WO CONTRAST  Result Date: 03/21/2020 CLINICAL DATA:  69 year old male with family history of colon cancer. Screening. EXAM: CT ABDOMEN AND PELVIS WITHOUT CONTRAST TECHNIQUE: Multidetector CT imaging of the abdomen and pelvis was performed following the standard protocol without IV contrast. COMPARISON:  None. FINDINGS: Evaluation of this exam is limited in the absence of intravenous contrast. Lower chest: Probable trace left pleural effusion versus pleural thickening. There are bibasilar atelectasis/scarring.  Cardiomegaly. Multi vessel coronary vascular calcification and cardiac pacemaker wires. No intra-abdominal free air. Small perihepatic ascites. Hepatobiliary: Morphologic changes of early cirrhosis. No intrahepatic biliary ductal dilatation. There are multiple stones within the gallbladder. No pericholecystic fluid or evidence of acute cholecystitis by CT. Pancreas: Mild pancreatic atrophy. No active inflammation. Spleen: Normal in size without focal abnormality. Adrenals/Urinary Tract: The adrenal glands are unremarkable. There is no hydronephrosis or nephrolithiasis. The visualized ureters and urinary bladder appear unremarkable. Stomach/Bowel: Small hiatal hernia. There is no bowel obstruction or active inflammation. The appendix is unremarkable. Vascular/Lymphatic: Advanced aortoiliac atherosclerotic disease. The IVC is grossly unremarkable on this noncontrast CT. No portal venous gas. There is no adenopathy. Reproductive: The prostate and seminal vesicles are grossly unremarkable. Other: Small fat containing bilateral inguinal hernias. Compression noted over the right groin. Musculoskeletal: Osteopenia with degenerative changes of the spine. No acute osseous pathology. IMPRESSION: 1. No acute intra-abdominal or pelvic pathology. No bowel obstruction. Normal appendix. 2. Cirrhosis with small perihepatic ascites. 3. Cholelithiasis. No evidence of acute cholecystitis by CT. 4. Aortic Atherosclerosis (ICD10-I70.0). Electronically Signed   By: Anner Crete M.D.   On: 03/24/2020 17:16   DG Orthopantogram  Result Date: 04/03/2020 CLINICAL DATA:  Evaluate for possible periapical abscess EXAM: ORTHOPANTOGRAM/PANORAMIC COMPARISON:  None. FINDINGS: Panoramic view of the mandible demonstrates no acute fracture or dislocation. The patient is almost edentulous with only 4 residual teeth in the mandible on the left. No periapical abscess is noted. No significant dental caries are seen. No other focal abnormality is  noted. IMPRESSION: No definitive abscess is noted. Electronically Signed   By: Inez Catalina M.D.   On: 03/15/2020 14:25   DG Chest 1 View  Result Date: 04/10/2020 CLINICAL DATA:  Shortness of breath.  Open heart surgery EXAM: CHEST  1 VIEW COMPARISON:  Two days ago FINDINGS: Postoperative heart with LVAD and left atrial clipping. Dual-chamber ICD leads from the left. Right-sided central lines in unremarkable position. The feeding tube at least reaches the stomach. Unchanged bilateral pulmonary opacity, more dense on the left there may be pleural fluid. No visible pneumothorax. IMPRESSION: Stable hardware positioning and left more than right opacification. Electronically Signed   By: Monte Fantasia M.D.   On: 04/10/2020 08:57   DG Chest 1 View  Result Date: 04/07/2020 CLINICAL DATA:  Post open heart surgery EXAM: CHEST  1 VIEW COMPARISON:  04/06/2020 FINDINGS: LVAD and left AICD remain in place, unchanged. Interval removal of endotracheal tube. Right dialysis catheter is unchanged. Cardiomegaly. Worsening aeration in the left lung may reflect a combination of left effusion and atelectasis/consolidation. Minimal right base atelectasis. Mild vascular congestion. IMPRESSION: Support devices as above, in expected position. Cardiomegaly, vascular congestion. Increasing left lung airspace disease/effusion. Electronically Signed   By: Rolm Baptise M.D.   On: 04/07/2020 08:56   DG Chest 1 View  Result Date: 03/22/2020 CLINICAL DATA:  Endotracheally intubated. EXAM: CHEST  1 VIEW COMPARISON:  1-1/2 hours prior. CT 02/28/2020 FINDINGS: Endotracheal tube tip at the clavicular heads. There are  2 enteric tubes in place, both tips in the proximal stomach. Right subclavian central venous catheter tip in the mid SVC. Right internal jugular central venous catheter tip in the lower SVC. Left-sided chest tubes in place. Left ventricular assist device in place. Post left atrial clipping. Stable cardiomegaly. Aortic  atherosclerosis. Patchy opacity in the left mid lung, not significantly changed. Retrocardiac opacity may be worsened, possible pleural effusion. No visualized pneumothorax. Overall vascular congestion. IMPRESSION: 1. Endotracheal tube tip at the clavicular heads. 2. Two enteric tubes in place, both tips in the proximal stomach. Additional support apparatus unchanged. 3. Unchanged cardiomegaly, vascular congestion, and left midlung opacity. Retrocardiac opacity may be worsened. Electronically Signed   By: Keith Rake M.D.   On: 03/21/2020 19:01   DG Chest 1 View  Result Date: 03/24/2020 CLINICAL DATA:  Chest tube, LVAD EXAM: CHEST  1 VIEW COMPARISON:  03/23/2020 FINDINGS: Support devices are stable. Cardiomegaly. Bilateral airspace disease slightly worsened since prior study. This is greater on the right. No effusions or pneumothorax. No acute bony abnormality. IMPRESSION: Worsening bilateral airspace disease, right greater than left could reflect asymmetric edema or infection. Electronically Signed   By: Rolm Baptise M.D.   On: 03/24/2020 08:02   DG Chest 1 View  Result Date: 03/22/2020 CLINICAL DATA:  Open heart surgery. EXAM: CHEST  1 VIEW COMPARISON:  Radiograph 03/21/2020 FINDINGS: LEFT-sided pacemaker and LVAD device noted. Swan-Ganz catheter with tip in the main pulmonary artery. RIGHT chest tube in place without pneumothorax. Interval extubation Cardiomegaly with mild central venous congestion. No overt pulmonary edema. Probable LEFT effusion. No pneumothorax. IMPRESSION: 1. Interval extubation without complication. 2. Stable additional support apparatus. 3. Cardiomegaly and central venous congestion with LEFT basilar atelectasis and small effusion. Electronically Signed   By: Suzy Bouchard M.D.   On: 03/22/2020 08:39   DG Abd 1 View  Result Date: 04/07/2020 CLINICAL DATA:  Feeding tube placement. EXAM: ABDOMEN - 1 VIEW COMPARISON:  04/06/2020 FINDINGS: Left ventricular assist device is  again noted. The feeding tube tip is in the distal descending duodenum near its junction with the third portion the duodenum. The bowel gas pattern is unremarkable. IMPRESSION: Feeding tube tip is in the distal descending duodenum. Electronically Signed   By: Marijo Sanes M.D.   On: 04/07/2020 12:38   DG Abd 1 View  Result Date: 04/06/2020 CLINICAL DATA:  Enteric catheter placement EXAM: ABDOMEN - 1 VIEW COMPARISON:  04/06/2020, 04/02/2020 FINDINGS: Single frontal view of the lower chest and upper abdomen demonstrates an enteric catheter tip projecting over the gastric antrum/duodenal bulb. Right internal jugular and subclavian catheter is as well as the dual lead pacer and left ventricular assist device unchanged. Cardiac silhouette is enlarged but stable. Persistent left basilar consolidation and likely effusion. IMPRESSION: 1. Enteric catheter overlying gastric antrum. 2. Persistent left basilar consolidation and effusion. 3. Other support devices as above. Electronically Signed   By: Randa Ngo M.D.   On: 04/06/2020 16:02   DG Abd 1 View  Result Date: 04/09/2020 CLINICAL DATA:  Orogastric tube placement. EXAM: ABDOMEN - 1 VIEW COMPARISON:  March 26, 2020. FINDINGS: Moderately distended air-filled stomach is noted. No large or small bowel dilatation is noted. Distal tips of nasogastric tube and feeding tube are seen in the proximal stomach. No radio-opaque calculi or other significant radiographic abnormality are seen. IMPRESSION: Distal tips of nasogastric tube and feeding tube are seen in the proximal stomach. Moderate gastric distention is noted. Electronically Signed   By: Jeneen Rinks  Murlean Caller M.D.   On: 03/31/2020 12:38   CT HEAD WO CONTRAST  Result Date: 04/03/2020 CLINICAL DATA:  Stroke, follow-up. EXAM: CT HEAD WITHOUT CONTRAST TECHNIQUE: Contiguous axial images were obtained from the base of the skull through the vertex without intravenous contrast. 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 The infarct has not significantly changed in extent as compared to prior examination 03/27/2020. No evidence of hemorrhagic conversion. There is no significant mass effect. No midline shift. No evidence of internal mass. No extra-axial fluid collection. Stable, mild generalized parenchymal atrophy. Vascular: No definite hyperdense vessel. Atherosclerotic calcifications. Skull: Normal. Negative for fracture or focal lesion. Sinuses/Orbits: Visualized orbits demonstrate no acute abnormality. Mild scattered paranasal sinus mucosal thickening. Partially visualized support tubes. No significant mastoid effusion. 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 The infarct has not significantly changed in extent as compared to prior exam 03/27/2020. No significant mass effect. No hemorrhagic conversion. No interval intracranial abnormality identified. Electronically Signed   By: Kellie Simmering DO   On: 04/03/2020 13:55   CT HEAD WO CONTRAST  Result Date: 03/27/2020 CLINICAL DATA:  Post LVAD.  Ataxia, agitated EXAM: CT HEAD WITHOUT CONTRAST TECHNIQUE: Contiguous axial images were obtained from the base of the skull through the vertex without intravenous contrast. COMPARISON:  01/04/2020 FINDINGS: Brain: Large area of infarct in the posterior temporal and parietal lobes on the right, new since prior study. No hemorrhage. No hydrocephalus. Vascular: No hyperdense vessel or unexpected calcification. Skull: No acute calvarial abnormality. Sinuses/Orbits: Visualized paranasal sinuses and mastoids clear. Orbital soft tissues unremarkable. Other: None IMPRESSION: Large posterior right  MCA infarct, new since prior study. Electronically Signed   By: Rolm Baptise M.D.   On: 03/27/2020 09:08   US RENAL  Result Date: 04/06/2020 CLINICAL DATA:  Acute kidney injury EXAM: RENAL / URINARY TRACT ULTRASOUND COMPLETE COMPARISON:  None. FINDINGS: Right Kidney: Renal measurements: 11.2 x 4.6 x 5.6 cm = volume: 151 mL. Increased cortical echogenicity. No mass or hydronephrosis visualized. Left Kidney: Renal measurements: 10.7 x 6.1 x 6.1 cm = volume: 208 mL. Increased cortical echogenicity. No mass or hydronephrosis visualized. Bladder: Appears normal for degree of bladder distention. Other: None. IMPRESSION: Normal size of the kidneys. No hydronephrosis. Increased cortical echogenicity, in keeping with medical renal disease. Electronically Signed   By: Eddie Candle M.D.   On: 04/05/2020 11:10   DG Chest Port 1 View  Result Date: 04/08/2020 CLINICAL DATA:  LVAD EXAM: PORTABLE CHEST 1 VIEW COMPARISON:  Yesterday FINDINGS: LVAD with partial coverage. Left atrial clipping. ICD in place. Sheath and dialysis catheter on the right. There is cardiopericardial enlargement and diffuse interstitial and airspace density. A feeding tube at least reaches the diaphragm. Left-sided aeration is improved. IMPRESSION: 1. Improved left-sided aeration. 2. Increased interstitial opacity likely reflecting edema. Electronically Signed   By: Monte Fantasia M.D.   On: 04/08/2020 08:53   DG CHEST PORT 1 VIEW  Result Date: 04/06/2020 CLINICAL DATA:  VATS, postoperative EXAM: PORTABLE CHEST 1 VIEW COMPARISON:  04/05/2020 FINDINGS: No significant change in AP portable examination with extensive support apparatus including endotracheal tube, LVAD, right neck and right subclavian vascular catheters, and left-sided chest tube. No significant pneumothorax. Unchanged mild diffuse interstitial  opacity. Unchanged left pleural effusion. Cardiomegaly. IMPRESSION: 1. No significant change in AP portable examination with extensive support  apparatus detailed above. 2.  Left sided chest tube without significant pneumothorax. 3.  Unchanged mild, diffuse interstitial opacity, likely edema. 4.  Unchanged left pleural effusion. Electronically Signed   By: Eddie Candle M.D.   On: 04/06/2020 12:41   DG CHEST PORT 1 VIEW  Result Date: 04/05/2020 CLINICAL DATA:  Chest tube in place EXAM: PORTABLE CHEST 1 VIEW COMPARISON:  April 04, 2020 FINDINGS: The ETT is in good position. The NG tube terminates below today's film. Stable right IJ sheath. Stable left chest tubes. No pneumothorax. Stable opacity in left base. Stable left ventricular assist device. Mild opacity in the right base. Stable opacity in left mid lung. IMPRESSION: 1. Support apparatus as above. 2. Mild increased opacity in the right base is nonspecific but could represent developing pneumonia or aspiration. Atelectasis possible. 3. Stable opacity in left perihilar region. 4. Stable left retrocardiac opacity. 5. No other change. Electronically Signed   By: Dorise Bullion III M.D   On: 04/05/2020 12:05   DG Chest Port 1 View  Result Date: 04/04/2020 CLINICAL DATA:  LVAD EXAM: PORTABLE CHEST 1 VIEW COMPARISON:  04/03/2020 FINDINGS: The endotracheal tube, feeding tube, right IJ Cordis and right subclavian catheters are stable. LVAD in good position without complicating features. Persistent left pleural effusion and overlying atelectasis. Improved right lung aeration. The left chest tube is stable. No pneumothorax. IMPRESSION: 1. Stable support apparatus. 2. Improved right lung aeration. Electronically Signed   By: Marijo Sanes M.D.   On: 04/04/2020 10:22   DG Chest Port 1 View  Result Date: 04/03/2020 CLINICAL DATA:  Left ventricular assist device. EXAM: PORTABLE CHEST 1 VIEW COMPARISON:  04/02/2020 FINDINGS: The endotracheal tube, NG tube, feeding tube, right IJ Cordis, right subclavian central venous catheter and left-sided chest tubes are stable. Left ventricular assist device in stable  position without complicating features. Stable cardiac enlargement, vascular congestion, pulmonary edema and left retrocardiac density are unchanged. No pneumothorax. IMPRESSION: 1. Stable support apparatus. 2. Persistent cardiac enlargement, vascular congestion, pulmonary edema and left retrocardiac density. Electronically Signed   By: Marijo Sanes M.D.   On: 04/03/2020 06:44   DG Chest Port 1 View  Result Date: 04/02/2020 CLINICAL DATA:  Left ventricular assist device. EXAM: PORTABLE CHEST 1 VIEW COMPARISON:  04/09/2020 FINDINGS: The endotracheal tube, feeding tube, right IJ Cordis, right subclavian central venous catheters and left chest tube are stable. The left ventricular assist device appears stable. No complicating features are demonstrated. Stable cardiac enlargement, central vascular congestion and pulmonary edema. Suspect persistent left effusion. IMPRESSION: 1. Stable support apparatus. 2. Persistent cardiac enlargement, central vascular congestion and pulmonary edema. 3. Suspect left pleural effusion and left lower lobe atelectasis. Electronically Signed   By: Marijo Sanes M.D.   On: 04/02/2020 08:59   DG Chest Portable 1 View  Result Date: 03/22/2020 CLINICAL DATA:  Line placement EXAM: PORTABLE CHEST 1 VIEW COMPARISON:  04/02/2020 FINDINGS: Support Apparatus: --Endotracheal tube: Tip at the level of the clavicular heads. --Enteric tube:Tip projects over the stomach --Catheter(s):Right IJ central venous catheter tip at the cavoatrial junction. --Other: Left chest wall AICD leads in expected position. Right IJ sheath with tip projecting over the mid SVC. Left ventricular assist device. Moderate opacity in the left lung, improved from the prior study. Left chest tube tips project at the left lung apex. IMPRESSION: 1. Right IJ central venous catheter tip at the  cavoatrial junction. 2. Radiographically appropriate position of endotracheal tube. 3. Improved aeration of the left lung compared to  the prior study. Electronically Signed   By: Ulyses Jarred M.D.   On: 04/09/2020 17:32   DG Chest Port 1 View  Result Date: 04/11/2020 CLINICAL DATA:  Left pleural effusion. EXAM: PORTABLE CHEST 1 VIEW COMPARISON:  Radiograph of same day. FINDINGS: Endotracheal and feeding tubes are unchanged in position. Right subclavian catheter is unchanged in position. Left ventricular assist device is unchanged. Left-sided pacemaker is unchanged. No pneumothorax is noted. There is complete opacification of the left hemithorax consistent with effusion and/or atelectasis. Left-sided chest tube is unchanged in position. Mild right basilar atelectasis or edema is noted. Bony thorax is unremarkable. IMPRESSION: Stable support apparatus. Continued diffuse opacification of left hemithorax as described above. Mild right basilar atelectasis or edema. Electronically Signed   By: Marijo Conception M.D.   On: 04/03/2020 12:40   DG CHEST PORT 1 VIEW  Result Date: 03/24/2020 CLINICAL DATA:  Left ventricular assist device. EXAM: PORTABLE CHEST 1 VIEW COMPARISON:  One-view chest x-ray 03/29/2020 FINDINGS: The endotracheal tube feeding tube, and right subclavian line are stable. Left-sided chest tube remains. Pacing and defibrillator wires are stable. Left ventricular assist device is stable. The left lung remains opacified. Mild edema in the right lung is stable. IMPRESSION: 1. Stable positioning of left ventricular assist device. 2. Support apparatus is stable. 3. Persistent opacification of the left hemithorax. 4. Stable right-sided edema. Electronically Signed   By: San Morelle M.D.   On: 04/03/2020 06:47   DG CHEST PORT 1 VIEW  Result Date: 03/29/2020 CLINICAL DATA:  Intubation. EXAM: PORTABLE CHEST 1 VIEW COMPARISON:  03/31/2020. FINDINGS: Interim intubation. Endotracheal tube tip 3 cm above the carina. Interim removal of left central line. Right subclavian line and right IJ sheath in stable position. Feeding tube tip  below left hemidiaphragm. AICD and left ventricular assist device in stable position. Prior median sternotomy. Left atrial appendage clip in stable position. Stable cardiomegaly. Diffuse bilateral pulmonary infiltrates, again noted. Increased opacification left hemithorax possibly with secondary to prominent left pleural effusion. No pneumothorax. IMPRESSION: 1. Interim intubation. Endotracheal tube tip 3 cm above the carina. 2. Interim removal of left central line. Remaining lines and tubes in stable position. 3. AICD left ventricular assist device in stable position. Prior median sternotomy. Left atrial appendage clip in stable position. Stable cardiomegaly. 4. Bilateral pulmonary infiltrates again noted. Increased opacification of left hemithorax most likely secondary to prominent left pleural effusion. No pneumothorax. Electronically Signed   By: Marcello Moores  Register   On: 04/10/2020 05:58   DG CHEST PORT 1 VIEW  Result Date: 03/31/2020 CLINICAL DATA:  Central line placement. EXAM: PORTABLE CHEST 1 VIEW COMPARISON:  Chest x-ray from same day at 0550 hours. FINDINGS: New central venous catheter in the left neck with the tip overlying the peripheral mid left chest wall. Unchanged right internal jugular sheath and right subclavian central venous catheter. Unchanged feeding tube. Unchanged left chest wall AICD and LVAD. Stable cardiomegaly. Unchanged diffuse interstitial thickening with areas of hazy airspace disease. No pneumothorax or large pleural effusion. No acute osseous abnormality. IMPRESSION: 1. New central venous catheter in the left neck with the tip overlying the peripheral mid left chest wall. 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 No  pneumothorax. 2. Other support apparatuses are stable. 3. Unchanged pulmonary edema. These  results will be called to the ordering clinician or representative by the Radiologist Assistant, and communication documented in the PACS or [employer]. Electronically Signed   By: Titus Dubin M.D.   On: 03/31/2020 16:54   DG Chest Port 1 View  Result Date: 03/31/2020 CLINICAL DATA:  Left jugular assist device. EXAM: PORTABLE CHEST 1 VIEW COMPARISON:  03/30/2020 FINDINGS: Again noted is an LVAD. Right subclavian central venous catheter extends into the lower SVC region. Right jugular central line. Feeding tube extends into the abdomen but the tip is beyond the image. Again noted is a left cardiac ICD. No significant change in the cardiomegaly. Slightly increased densities in the mid and lower right lung that are concerning for pulmonary edema. Negative for a pneumothorax. IMPRESSION: 1. Increased lung densities are suggestive for worsening pulmonary edema. 2. Stable support apparatuses. 3. Stable cardiomegaly. Electronically Signed   By: Markus Daft M.D.   On: 03/31/2020 08:38   DG Chest Port 1 View  Result Date: 03/30/2020 CLINICAL DATA:  Shortness of breath and chest pain. LVAD in place. EXAM: PORTABLE CHEST 1 VIEW COMPARISON:  Yesterday. FINDINGS: Stable right jugular catheter sheath. Stable right subclavian catheter its tip at the superior cavoatrial junction. Feeding tube extending into the stomach. Stable LVAD. Stable left subclavian pacer and AICD leads. Mildly decreased left basilar atelectasis and possible pneumonia. Resolved mild right basilar atelectasis. Stable prominence of the pulmonary vasculature and interstitial markings with resolved mild superimposed airspace opacities. Stable enlarged cardiac silhouette. Moderate left glenohumeral joint degenerative changes. IMPRESSION: 1. Mildly decreased left basilar atelectasis and possible pneumonia. 2. Resolved mild right basilar atelectasis. 3. Improving changes of congestive heart failure with stable cardiomegaly. Electronically Signed    By: Claudie Revering M.D.   On: 03/30/2020 10:30   DG CHEST PORT 1 VIEW  Result Date: 03/29/2020 CLINICAL DATA:  Left ventricular assist device. EXAM: PORTABLE CHEST 1 VIEW COMPARISON:  March 28, 2020. FINDINGS: Stable cardiomegaly. Feeding tube is seen entering stomach. Left ventricular assist device is unchanged in position. Right subclavian catheter is noted. Left-sided pacemaker is unchanged. No pneumothorax is noted. Stable bibasilar atelectasis is noted. Bony thorax is unremarkable. IMPRESSION: Stable support apparatus. Stable bibasilar atelectasis. No pneumothorax is noted. Electronically Signed   By: Marijo Conception M.D.   On: 03/29/2020 10:23   DG CHEST PORT 1 VIEW  Result Date: 03/28/2020 CLINICAL DATA:  CHF. EXAM: PORTABLE CHEST 1 VIEW COMPARISON:  03/27/2020, 03/26/2020 chest radiograph. FINDINGS: Sequela of LVAD and left chest pacing device. Right IJ introducer sheath, right subclavian CVC terminating within the upper right atrium, bilateral chest tubes and partially imaged enteric tube terminating outside field of view are unchanged. Low lung volumes. Diffuse interstitial prominence and bilateral patchy opacities are unchanged. No pneumothorax or right pleural effusion. LVAD obscures left costophrenic sulcus. Cardiomegaly, sequela of left atrial appendage clipping and aortic atherosclerotic calcifications. Osseous structures are unchanged. IMPRESSION: Stable postsurgical appearance of the chest. Electronically Signed   By: Primitivo Gauze M.D.   On: 03/28/2020 10:04   DG Chest Port 1 View  Result Date: 03/27/2020 CLINICAL DATA:  Chest tube.  Left ventricular assist device. EXAM: PORTABLE CHEST 1 VIEW COMPARISON:  03/26/2020 FINDINGS: Interim placement of feeding tube, its tip is below left hemidiaphragm. Interim removal Swan-Ganz catheter and mediastinal drainage catheters. Right IJ sheath in stable position. Right subclavian line stable position. Left chest tube in stable position. Cardiac  pacer left ventricular assist device in stable position. Left atrial appendage clip again noted. Stable cardiomegaly. Unchanged bilateral interstitial prominence suggesting interstitial edema. Pneumonitis cannot be excluded. Tiny left pleural effusion cannot be excluded. No pneumothorax. IMPRESSION: 1. Interim placement of feeding tube, its tip is below left hemidiaphragm. Interim removal Swan-Ganz catheter and mediastinal drainage catheters. Right IJ sheath in stable position. Right subclavian line in stable position. Left chest tube in stable position. No pneumothorax. 2. Cardiac pacer left ventricular assist device in stable position. Stable cardiomegaly. 3. Unchanged bilateral interstitial prominence suggesting interstitial edema. Tiny left pleural effusion cannot be excluded. Electronically Signed   By: Marcello Moores  Register   On: 03/27/2020 07:26   DG Chest Port 1 View  Result Date: 03/26/2020 CLINICAL DATA:  Status post cardiac surgery. EXAM: PORTABLE CHEST 1 VIEW COMPARISON:  March 25, 2020. FINDINGS: Stable cardiomegaly. Left-sided pacemaker is unchanged in position. Stable position of left ventricular assist device. Right subclavian catheter is unchanged in position. Right internal jugular Swan-Ganz catheter is noted with tip in expected position of main pulmonary artery. Left-sided chest tube is noted without pneumothorax. Mild bilateral lung opacities are noted concerning for edema. Probable small left pleural effusion and left basilar atelectasis is noted. Bony thorax is unremarkable. IMPRESSION: Stable support apparatus. No pneumothorax is noted. Mild bilateral lung opacities are noted concerning for edema. Probable small left pleural effusion and left basilar atelectasis is noted. Electronically Signed   By: Marijo Conception M.D.   On: 03/26/2020 09:47   DG CHEST PORT 1 VIEW  Result Date: 03/25/2020 CLINICAL DATA:  Post hemodialysis catheter insertion. EXAM: PORTABLE CHEST 1 VIEW COMPARISON:   03/25/2020 FINDINGS: Left-sided pacemaker and left ventricular assist device unchanged. Right IJ Swan-Ganz catheter with tip over the main pulmonary artery segment unchanged. Interval placement of right subclavian central venous catheter with tip at the cavoatrial junction. Catheter from below with tip just right of the Swan-Ganz catheter tip in the main pulmonary artery segment unchanged. Lungs are adequately inflated demonstrate persistent hazy bilateral perihilar opacification with interval improvement. Persistent bibasilar opacification without significant change. Pneumothorax. Stable cardiomegaly. Remainder of the exam is unchanged. IMPRESSION: 1. Stable cardiomegaly with interval improvement and hazy bilateral perihilar opacification. Stable bibasilar airspace density. 2. Tubes and lines as described. New right subclavian central venous catheter with tip over the SVC. No pneumothorax. Electronically Signed   By: Marin Olp M.D.   On: 03/25/2020 09:20   DG Chest Port 1 View  Result Date: 03/25/2020 CLINICAL DATA:  Confusion. Left ventricular assist device present. Areas of airspace consolidation EXAM: PORTABLE CHEST 1 VIEW COMPARISON:  March 24, 2020 FINDINGS: Central catheter tip is in the main pulmonary outflow tract. Pacemaker leads are attached to the right atrium and right ventricle. There is incomplete visualization of a left ventricular assist device. No pneumothorax. There is widespread airspace opacity throughout the right lung, with most pronounced consolidation in the right base. There is ill-defined airspace opacity in the left base as well, stable. There is cardiomegaly with pulmonary vascularity within normal limits. There is aortic atherosclerosis. No adenopathy. No appreciable bone lesions. IMPRESSION: Multifocal airspace opacity, considerably more on the right than on the left. Suspect multifocal pneumonia, although a degree of pulmonary edema superimposed is quite possible. Both edema  and pulmonary edema may be present concurrently. Stable cardiac prominence. Postoperative changes. Aortic Atherosclerosis (ICD10-I70.0). Electronically Signed   By: Lowella Grip III M.D.   On: 03/25/2020 08:57   DG Chest [facility]  Result Date: 03/23/2020 CLINICAL DATA:  Post open heart surgery. EXAM: PORTABLE CHEST 1 VIEW COMPARISON:  03/22/2020 FINDINGS: Left ventricular assist device and left-sided cardiac pacemaker unchanged. Right IJ Swan-Ganz catheter with tip over the main pulmonary artery segment unchanged. Catheter from below courses over the right heart with projecting in the region of the main pulmonary artery segment unchanged. Right-sided chest tube unchanged. Lungs are adequately inflated demonstrate mild worsening opacification over the right base which may be due to atelectasis or infection. Slight worsening opacification in the left retrocardiac region which may be due to effusion/atelectasis. Mild stable prominence of the perihilar vessels suggesting mild vascular congestion. Stable cardiomegaly. Remainder the exam is unchanged. IMPRESSION: 1. Slight worsening hazy opacification in the left base/retrocardiac region as well as right base. Findings may be due to atelectasis/effusion versus infection. 2.  Support apparatus as described. Electronically Signed   By: Marin Olp M.D.   On: 03/23/2020 08:23   DG Chest Port 1 View  Result Date: 03/21/2020 CLINICAL DATA:  Status post left ventricular assist device. EXAM: PORTABLE CHEST 1 VIEW COMPARISON:  03/27/2020 FINDINGS: The endotracheal tube, NG tube, right chest tube and Swan-Ganz catheters are stable. The pacer wires are stable. Left ventricular assist device in good position without complicating features. Streaky left basilar atelectasis but no edema, infiltrates or large effusions. IMPRESSION: 1. Stable support apparatus. 2. Streaky left basilar atelectasis. Electronically Signed   By: Marijo Sanes M.D.   On: 03/21/2020 08:25    DG CHEST PORT 1 VIEW  Result Date: 04/10/2020 CLINICAL DATA:  Left ventricular assist device placement EXAM: PORTABLE CHEST 1 VIEW COMPARISON:  03/15/2020 FINDINGS: Endotracheal tube tip 5 cm above the carina. Orogastric or nasogastric tube enters the abdomen. Swan-Ganz catheter inserted from a right internal jugular approach has its tip in the main pulmonary artery. Pacemaker/AICD remains in place. Left ventricular assist device now present. No sign of pneumothorax or pleural fluid. Atrial clip in place. The lungs are clear except for a volume loss in the left lower lobe. No pulmonary edema. IMPRESSION: Left ventricular assist device placement. No evidence of procedural complication. Left lower lobe volume loss. Lines and tubes well position radiographically. Electronically Signed   By: Nelson Chimes M.D.   On: 03/29/2020 15:05   DG CHEST PORT 1 VIEW  Result Date: 03/15/2020 CLINICAL DATA:  Line placement EXAM: PORTABLE CHEST 1 VIEW COMPARISON:  X-ray from same day FINDINGS: There is been interval placement of a Swan-Ganz catheter with the tip terminating over the right lower lobe pulmonary artery. The heart size remains enlarged. The patient is status post prior median sternotomy. There is a dual chamber left-sided AICD in place. There is vascular congestion without overt pulmonary edema. IMPRESSION: Swan-Ganz catheter projects over the right lower lobe pulmonary artery. There is no pneumothorax. Otherwise, no significant short interval change. Electronically Signed   By: Constance Holster M.D.   On: 03/15/2020 20:16   DG CHEST PORT 1 VIEW  Result Date: 03/15/2020 CLINICAL DATA:  Line placement. EXAM: PORTABLE CHEST 1 VIEW COMPARISON:  March 11, 2020 FINDINGS: There is been interval placement of a Swan-Ganz catheter with the tip terminating over a segmental branch of the right lower lobe pulmonary artery. The heart size remains enlarged. The patient is status post prior median sternotomy. There is a  dual chamber left-sided AICD in place. There is vascular congestion without overt pulmonary edema. IMPRESSION: 1. Swan-Ganz catheter as detailed above. No evidence for pneumothorax. 2. Stable cardiomegaly with mild vascular  congestion. Electronically Signed   By: Constance Holster M.D.   On: 03/15/2020 20:15   DG Abd Portable 1V  Result Date: 04/02/2020 CLINICAL DATA:  Feeding tube placement. EXAM: PORTABLE ABDOMEN - 1 VIEW COMPARISON:  None. FINDINGS: The bowel gas pattern is normal. Distal tip of feeding tube is seen in expected position proximal duodenum. No radio-opaque calculi or other significant radiographic abnormality are seen. IMPRESSION: Distal tip of feeding tube seen in expected position proximal duodenum. Electronically Signed   By: Marijo Conception M.D.   On: 04/02/2020 11:54   DG Abd Portable 1V  Result Date: 03/26/2020 CLINICAL DATA:  Feeding tube. EXAM: PORTABLE ABDOMEN - 1 VIEW COMPARISON:  None. FINDINGS: Distal tip of feeding tube appears to be in proximal stomach. Visualized bowel loops are unremarkable. IMPRESSION: Distal tip of feeding tube appears to be in proximal stomach. Electronically Signed   By: Marijo Conception M.D.   On: 03/26/2020 11:49   ECHO INTRAOPERATIVE TEE  Result Date: 03/29/2020  *INTRAOPERATIVE TRANSESOPHAGEAL REPORT *  Patient Name:   INMAN FETTIG [facility] Date of Exam: 03/16/2020 Medical Rec #:  [id number]           Height:       72.0 in Accession #:    [id number]          Weight:       227.1 lb Date of Birth:  September 03, 1951           BSA:          2.25 m Patient Age:    55 years            BP:           133/87 mmHg Patient Gender: M                   HR:           80 bpm. Exam Location:  Anesthesiology Transesophogeal exam was perform intraoperatively during surgical procedure. Patient was closely monitored under general anesthesia during the entirety of examination. Indications:     CAD Native Vessel i25.10, LVAD placement Performing Phys: [id number] Z ATKINS  Diagnosing Phys: Roberts Gaudy MD PRE-OP FINDINGS  Left Ventricle: There is abnormal (paradoxical) septal motion, consistent with left bundle branch block. The LV cavity was markedly enlarged and measured 6.4 cm at end-diastole at the mid-papillary level in the trans-gastric short-axis view. The ejection fraction was calculated to be 20-25% using the 2DQ border recognition software. There was global LV hypokinesis. LV wall thickness appeared normal without obvious thinned or aneursmal segments. On the post-bypass exam, the LV inflow cannula was well positioned and oriented towards the mitral valve. The cannula was not abutting the inter-ventricular septum and there was laminar inflow of blood. Right Ventricle: On the pre-bypass exam, the RV was normal in size and there was mildly reduced RV systolic function. On the post-bypass exam, there was mildly reduced RV systolic function. There was mild septal flattening noted intermittently, but the septum remained in a left to right convex orientation for the vast majority of the time. Left Atrium: The LAA cavity was dilated and measured 4.9 cm in the medial-lateral dimension. There was no thrombus noted with the LAA cavity or LAA appendage.  Right Atrium: The RA cavity was moderately enlarged with an AICD lead seen entering the RA from the SVC and passing the tricuspid valve. Interatrial Septum: There is no evidence of a patent foramen ovale. There was no evidence  of patent foramen oval or atrial septal defect by color Doppler and bubble study with valsalva and agitated albumin. On the post-bypass exam, there was no evidence of patent foramen ovale by color Doppler. Mitral Valve: No evidence of mitral valve stenosis. There was an anunuloplasty ring in the mitral position. The anterior leaflet was freely mobile and the posterior leaflet was restricted in motion. There was moderate mitral regurgitation with two jets seen on color Doppler. The MR jets involved the P2/A2 and  anterior-lateral commissure and were within the annuloplasty ring. The mean trans-mitral gradient was 2-3 mm hg by CW Doppler. Tricuspid Valve: The tricuspid annulus was normal in diameter and measured 3.3 cm in the four-chamber view and 3.3 cm in the RV inflow-outflow view. There was an AICD lead traversing the tricuspid valve. On the post-bypass exam, there was mild tricuspid regurgitation. Aortic Valve: The aortic valve was tri-leaflet. The leaflets were mildly thickened and opened normallly. There was no aortic insufficiency seen on color Doppler. Aorta: There was a well defined aortic root and sino-tubular junction without dilatation or effacement. There was mild intimal thickening but no protruding atheromatous disease present.  Roberts Gaudy MD Electronically signed by Roberts Gaudy MD Signature Date/Time: 04/10/2020/8:31:18 PM    Final    ECHOCARDIOGRAM LIMITED  Result Date: 03/22/2020    ECHOCARDIOGRAM LIMITED REPORT   Patient Name:   KADE DEMICCO [facility] Date of Exam: 03/22/2020 Medical Rec #:  [id number]           Height:       72.0 in Accession #:    [id number]          Weight:       246.3 lb Date of Birth:  02/03/1951           BSA:          2.328 m Patient Age:    21 years            BP:           93/67 mmHg Patient Gender: M                   HR:           113 bpm. Exam Location:  Inpatient Procedure: Limited Echo and Color Doppler Indications:    I31.3 (Rule out)Pericardial effusion (noninflammatory)  History:        Patient has prior history of Echocardiogram examinations, most                 recent 03/13/2020. LV DysFx, CAD, Abnormal ECG and Defibrillator,                 Mitral Valve Disease, Arrythmias:Atrial Fibrillation;                 Signs/Symptoms:Chest Pain. Cardiac shock. LVAD. Elevated                 troponin.  Sonographer:    Roseanna Rainbow RDCS Referring Phys: [place] Comments: Patient is morbidly obese and Technically difficult study due to poor echo windows. Image  acquisition challenging due to patient body habitus and LVAD. Only limited off axis views due to LVAD and wound dressings. IMPRESSIONS  1. Images are poor quality and cannot comment on global LV function. . Left ventricular ejection fraction, by estimation, is 20 to 25%. The left ventricle has severely decreased function. The left ventricle demonstrates global hypokinesis. There is moderate concentric left ventricular  hypertrophy.  2. Aortic dilatation noted. There is mild dilatation of the ascending aorta and of the aortic root measuring 41 mm and 34mm respectively.  3. Right ventricular systolic function is severely reduced. The right ventricular size is moderately enlarged.  4. The aortic valve is tricuspid. Mild aortic valve sclerosis is present, with no evidence of aortic valve stenosis. FINDINGS  Left Ventricle: Images are poor quality and cannot comment on global LV function. Left ventricular ejection fraction, by estimation, is 20 to 25%. The left ventricle has severely decreased function. The left ventricle demonstrates global hypokinesis. There is moderate concentric left ventricular hypertrophy. Right Ventricle: The right ventricular size is moderately enlarged. Right ventricular systolic function is severely reduced. Left Atrium: Left atrial size was not well visualized. Right Atrium: Right atrial size was not well visualized. Tricuspid Valve: Tricuspid valve regurgitation is mild. Aortic Valve: The aortic valve is tricuspid. Mild aortic valve sclerosis is present, with no evidence of aortic valve stenosis. Pulmonic Valve: The pulmonic valve was not well visualized. Aorta: Aortic dilatation noted. There is mild dilatation of the ascending aorta and of the aortic root measuring 41 mm. Additional Comments: A pacer wire is visualized.  LEFT VENTRICLE PLAX 2D LVIDd:         4.60 cm LVIDs:         4.20 cm LV PW:         1.60 cm LV IVS:        1.70 cm  LEFT ATRIUM         Index LA diam:    3.90 cm 1.68 cm/m    AORTA Ao Root diam: 3.90 cm Ao Asc diam:  4.10 cm TRICUSPID VALVE TR Peak grad:   18.8 mmHg TR Vmax:        217.00 cm/s Fransico Him MD Electronically signed by Fransico Him MD Signature Date/Time: 03/22/2020/2:19:25 PM    Final    [place] EKG SITE RITE  Result Date: 03/31/2020 If Site Rite image not attached, placement could not be confirmed due to current cardiac rhythm.  US Abdomen Limited RUQ  Result Date: 03/24/2020 CLINICAL DATA:  Cholecystitis. EXAM: ULTRASOUND ABDOMEN LIMITED RIGHT UPPER QUADRANT COMPARISON:  CT abdomen and pelvis 04/03/2020 FINDINGS: Examination was limited due to a small acoustic window from external bandage material as well as patient immobility. Gallbladder: Acoustic shadowing throughout the gallbladder likely secondary to the stones demonstrated on prior CT. The gallbladder wall could not be differentiated from the near surface of the stones. 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 No sonographic Murphy sign noted by sonographer. Common bile duct: Diameter: 4 mm Liver: No focal lesion identified. Within normal limits in parenchymal echogenicity. Portal vein is patent on color Doppler imaging with normal direction of blood flow towards the liver. Other: None. IMPRESSION: Cholelithiasis with limited assessment of the gallbladder wall. No Murphy sign to strongly suggest acute cholecystitis. Electronically Signed   By: Logan Bores M.D.   On: 03/24/2020 11:55    Labs:  CBC: Recent Labs    04/11/20 0414 04/12/20 0405 04/13/20 0418 04/14/20 0440  WBC 9.2 9.8 12.1* 12.0*  HGB 8.3* 7.7* 7.8* 7.1*  HCT 27.0* 24.8* 25.4* 23.5*  PLT 137* 136* 175 193    COAGS: Recent Labs    01/07/20 0831 03/14/20 1801 03/15/20 0311 04/03/2020 1312 04/02/2020 1312 03/28/2020 1512 03/21/20 0209 04/11/20 0414 04/12/20 0405 04/13/20 0418 04/14/20 0440  INR   < >  --   --  1.3*   < > 1.5*   < > 2.3* 2.6* 2.9* 2.5*   APTT  --  71* 60* 80*  --  36  --   --   --   --   --    < > = values in this interval not displayed.    BMP: Recent Labs    04/12/20 0405 04/12/20 1610 04/13/20 0418 04/14/20 0440  NA 138 136 138 136  K 4.6 4.9 5.2* 6.1*  CL 105 102 106 101  CO2 25 24 25 25   GLUCOSE 102* 107* 96 109*  BUN 36* 32* 51* 96*  CALCIUM 8.0* 8.0* 7.8* 8.2*  CREATININE 2.12* 1.89* 2.87* 5.52*  GFRNONAA 31* 35* 21* 10*  GFRAA 35* 41* 25* 11*    LIVER FUNCTION TESTS: Recent Labs    04/03/20 0329 04/03/20 1607 04/04/20 0332 04/04/20 1610 04/05/20 0323 04/05/20 1548 04/06/20 0256 04/06/20 1556 04/12/20 0405 04/12/20 1610 04/13/20 0418 04/14/20 0440  BILITOT 0.8  --  0.9  --  0.6  --  0.8  --   --   --   --   --   AST 37  --  35  --  33  --  34  --   --   --   --   --   ALT 22  --  22  --  23  --  21  --   --   --   --   --   ALKPHOS 119  --  112  --  142*  --  129*  --   --   --   --   --   PROT 5.2*  --  5.6*  --  5.9*  --  5.7*  --   --   --   --   --   ALBUMIN 2.3*  2.2*   < > 2.2*  2.1*   < > 2.1*  2.2*   < > 2.1*  2.2*   < > 2.0* 2.1* 2.0* 2.1*   < > = values in this interval not displayed.    TUMOR MARKERS: No results for input(s): AFPTM, CEA, CA199, CHROMGRNA in the last 8760 hours.  Assessment and Plan: Renal failure requiring RRT Previously on CRRT, now to trial IHD. Request for tunneled HD catheter if successful. Patient with elevated WBC yesterday and today-- 9.8  12.1 Afebrile.  Intermittent pressor requirement.  On coumadin with INR of 2.4.  Discussed with Dr. Vernard Gambles.  Ok to proceed without hold provided patient stable, tolerates trial, and no infectious concerns.  Will contact family for consent.    Thank you for this interesting consult.  I greatly enjoyed meeting [name] [name] and look forward to participating in their care.  A copy of this report was sent to the requesting provider on this date.  Electronically Signed: Docia Barrier,  PA 04/14/2020, 11:01 AM   I spent a total of 20 Minutes    in face to face in clinical consultation, greater than 50% of which was counseling/coordinating care for renal failure.

## 2020-04-14 NOTE — Progress Notes (Signed)
Occupational Therapy Treatment Patient Details Name: [name] [name] MRN: [id number] DOB: [birth year] Today's Date: 04/14/2020    History of present illness 69 yo male with history of CAD with prior stenting of the LAD, Diagonal and OM in an outside hospital, ischemic cardiomyopathy with ICD in place, chronic systolic CHF, MVP s/p mitral valve repair, permanent atrial fib on chronic anticoagulation, HTN, HLD, DM, depression admitted to [facility] with progressive weakness, fatigue, dyspnea and chest pressure. Troponin elevated at 0.06. He was transferred to [facility] for cardiac cath, showing severe 3 vessel disease. Plan for CABG but pt with AKI. Pt underwent multiple tooth extraction on 4/6 and LVAD placement on 4/8. Postop day 7 patient was noted to be agitated the CT head was obtained showing a large posterior right MCA infarct. Pt with arrest on 4/20 and reintubated, extubated 4/25. Pt on CRRT until 04/12/20 when transitioned to HD.   OT comments  Pt with lethargy and non interactive today. Pt unable to follow commands. Demonstrating zero sitting balance with bed in chair position with heavy L side lean. Pt tolerating bathing and dressing with total assist and stable VS. Unable to stand as he did during OT visit at the end of last week. Will continue to follow.  Follow Up Recommendations  CIR;Supervision/Assistance - 24 hour    Equipment Recommendations  None recommended by OT    Recommendations for Other Services      Precautions / Restrictions Precautions Precautions: Fall;Sternal Precaution Comments: LVAD, cortrak, flexiseal       Mobility Bed Mobility Overal bed mobility: Needs Assistance Bed Mobility: Supine to Sit;Rolling Rolling: Total assist;+2 for physical assistance         General bed mobility comments: Pt without attempt to assist with mobility. Rolled for pericare and linen change.  Transfers                      Balance Overall balance assessment:  Needs assistance Sitting-balance support: Feet supported Sitting balance-Leahy Scale: Zero Sitting balance - Comments: heavy L side and posterior lean Postural control: Left lateral lean     Standing balance comment: unable to stand with 2 person assist                            ADL either performed or assessed with clinical judgement   ADL                                         General ADL Comments: Pt requiring total assist with RN's assist.     Vision       Perception     Praxis      Cognition Arousal/Alertness: Lethargic Behavior During Therapy: Flat affect Overall Cognitive Status: Impaired/Different from baseline                                 General Comments: pt non verbal and not following commands this visit        Exercises Other Exercises Other Exercises: Pt seated with bed in chair position with PT and RN upon OT's arriva..   Shoulder Instructions       General Comments      Pertinent Vitals/ Pain       Pain Assessment: Faces Faces Pain Scale: Hurts  a little bit Pain Location: buttocks with pericare Pain Descriptors / Indicators: Grimacing Pain Intervention(s): Monitored during session;Repositioned  Home Living                                          Prior Functioning/Environment              Frequency  Min 2X/week        Progress Toward Goals  OT Goals(current goals can now be found in the care plan section)  Progress towards OT goals: Not progressing toward goals - comment(pt with lethargy)  Acute Rehab OT Goals Patient Stated Goal: no stated goal OT Goal Formulation: With patient Time For Goal Achievement: 04/21/20 Potential to Achieve Goals: [place] Discharge plan remains appropriate    Co-evaluation                 AM-PAC OT "6 Clicks" Daily Activity     Outcome Measure   Help from another person eating meals?: Total Help from another  person taking care of personal grooming?: Total Help from another person toileting, which includes using toliet, bedpan, or urinal?: Total Help from another person bathing (including washing, rinsing, drying)?: Total Help from another person to put on and taking off regular upper body clothing?: Total Help from another person to put on and taking off regular lower body clothing?: Total 6 Click Score: 6    End of Session Equipment Utilized During Treatment: Oxygen;Gait belt  OT Visit Diagnosis: Other abnormalities of gait and mobility (R26.89);Cognitive communication deficit (R41.841) Symptoms and signs involving cognitive functions: Cerebral infarction   Activity Tolerance Patient limited by lethargy   Patient Left in bed;with call bell/phone within reach;with nursing/sitter in room   Nurse Communication          Time: 6063-0160 OT Time Calculation (min): 16 min  Charges: OT General Charges $OT Visit: 1 Visit OT Treatments $Therapeutic Activity: 8-22 mins  [name] [name], OTR/L Acute Rehabilitation Services Pager: [phone] Office: [phone]   [name] 04/14/2020, 12:11 PM

## 2020-04-14 NOTE — Progress Notes (Signed)
CSW met at bedside although patient with eyes closed and not verbal during visit. CSW contacted patient's step daughter Ander Purpura by phone who shared that patient was interactive yesterday and making jokes with her. [name] reports she spoke with Palliative Care today and states she is "cognizant of what is happening but hoping for the best". Step daughter Ander Purpura reports patient's daughter Tammi Klippel who lives in [place] visited last weekend and brought an advanced directive stating that Tammi Klippel is HPOA. [name] reports she and Tammi Klippel are in communication about patient's health status. [name] appears to have a basic understanding of current situation and appears to be very hopeful for recovery. CSW provided supportive intervention and will continue to follow. [name], [place], [place]

## 2020-04-14 NOTE — Progress Notes (Signed)
Physical Therapy Treatment Patient Details Name: [name] [name] MRN: [id number] DOB: February 11, 1951 Today's Date: 04/14/2020    History of Present Illness 69 yo male with history of CAD with prior stenting of the LAD, Diagonal and OM in an outside hospital, ischemic cardiomyopathy with ICD in place, chronic systolic CHF, MVP s/p mitral valve repair, permanent atrial fib on chronic anticoagulation, HTN, HLD, DM, depression admitted to [facility] with progressive weakness, fatigue, dyspnea and chest pressure. Troponin elevated at 0.06. He was transferred to [facility] for cardiac cath, showing severe 3 vessel disease. Plan for CABG but pt with AKI. Pt underwent multiple tooth extraction on 4/6 and LVAD placement on 4/8. Postop day 7 patient was noted to be agitated the CT head was obtained showing a large posterior right MCA infarct. Pt with arrest on 4/20 and reintubated, extubated 4/25. Pt on CRRT until 04/12/20 when transitioned to HD.    PT Comments    Pt very lethargic today despite sitting pt up, bathing, max stimulation pt opening eyes but very limited interaction without significant command following. Pt off CVVHD and awaiting HD. Pt without sedating medication and unable to progress to standing trials or OOB today as hoped. Will continue to follow.    Follow Up Recommendations  CIR;Supervision/Assistance - 24 hour;LTACH     Equipment Recommendations       Recommendations for Other Services       Precautions / Restrictions Precautions Precautions: Fall;Sternal Precaution Comments: LVAD, cortrak, flexiseal    Mobility  Bed Mobility Overal bed mobility: Needs Assistance Bed Mobility: Rolling Rolling: Total assist;+2 for physical assistance         General bed mobility comments: total assist to roll bil for pericare and linen change. utilized foot egress to transition to sitting and back to supine.  Transfers Overall transfer level: Needs assistance     Sit to Stand: +2  physical assistance;From elevated surface;Max assist         General transfer comment: in bed egress max assist to bring trunk anteriorly off surface and to midline due to posterior left lean. assist for trunk off surface x 3 trials. pt with partial stand with max +2 assist pt without significant assist or engagement  Ambulation/Gait             General Gait Details: unable   Stairs             Wheelchair Mobility    Modified Rankin (Stroke Patients Only)       Balance Overall balance assessment: Needs assistance Sitting-balance support: Feet supported Sitting balance-Leahy Scale: Zero Sitting balance - Comments: heavy L side and posterior lean Postural control: Left lateral lean     Standing balance comment: unable to stand with 2 person assist                             Cognition Arousal/Alertness: Lethargic Behavior During Therapy: Flat affect Overall Cognitive Status: Impaired/Different from baseline                     Current Attention Level: Focused           General Comments: pt non verbal and not following commands this visit      Exercises     General Comments        Pertinent Vitals/Pain Pain Assessment: Faces Faces Pain Scale: Hurts a little bit Pain Location: buttocks with pericare Pain Descriptors / Indicators:  Grimacing Pain Intervention(s): Monitored during session;Repositioned    Home Living                      Prior Function            PT Goals (current goals can now be found in the care plan section) Acute Rehab PT Goals Patient Stated Goal: no stated goal Progress towards PT goals: Not progressing toward goals - comment(limited by cognition and interraction this session)    Frequency    Min 3X/week      PT Plan Current plan remains appropriate    Co-evaluation PT/OT/SLP Co-Evaluation/Treatment: Yes Reason for Co-Treatment: Complexity of the patient's impairments  (multi-system involvement);For patient/therapist safety PT goals addressed during session: Mobility/safety with mobility        AM-PAC PT "6 Clicks" Mobility   Outcome Measure  Help needed turning from your back to your side while in a flat bed without using bedrails?: Total Help needed moving from lying on your back to sitting on the side of a flat bed without using bedrails?: Total Help needed moving to and from a bed to a chair (including a wheelchair)?: Total Help needed standing up from a chair using your arms (e.g., wheelchair or bedside chair)?: Total Help needed to walk in hospital room?: Total Help needed climbing 3-5 steps with a railing? : Total 6 Click Score: 6    End of Session Equipment Utilized During Treatment: Gait belt Activity Tolerance: Patient limited by lethargy Patient left: in bed;with call bell/phone within reach;with nursing/sitter in room Nurse Communication: Mobility status;Precautions PT Visit Diagnosis: Muscle weakness (generalized) (M62.81);Other abnormalities of gait and mobility (R26.89);Other symptoms and signs involving the nervous system (R29.898)     Time: 1030-1058 PT Time Calculation (min) (ACUTE ONLY): 28 min  Charges:  $Therapeutic Activity: 8-22 mins                     Lakitha Gordy P, PT Acute Rehabilitation Services Pager: [phone] Office: [place] 04/14/2020, 12:49 PM

## 2020-04-14 NOTE — Progress Notes (Addendum)
Patient ID: [name] [name], male   DOB: Apr 20, 1951, 69 y.o.   MRN: [id number]    Advanced Heart Failure Rounding Note   Subjective:    - S/p HM3 VAD 4/8 w MAZE procedure + LAA clipping. - Extubated 4/9 - 4/12 LVAD speed increased to 5400 --> echo moderate-severe RV dysfunction, septum mildly left shifted, trivial pericardial effusion.  LVAD speed cut back to 5300.  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  - 4/20 developed hemothorax requiring emergent CT placement and eventual VATS - 4/21 EEG done given concerns for posturing and c/w moderate to severe diffuse encephalopathy. No seizures or epileptiform discharges were seen  - 04/03/20 Pump Stop --> controller change out. CT of head unchanged R MCA CVA - 04/06/20 Extubated - 5/1 CRRT stopped   Remains anuric. K 6.1. Confused today. Uremic.  BUN up to 96.  Plan is for 1st HD today. CVP 6-4. + 6 lb increase charted.   Co-ox 73%   LDH 271, INR 2.5  VAD Interrogation  Flow 3.8 Speed 5300, Power 3.7  Pulse Index 5.1. 1 PI event.   VAD interrogated personally. Parameters stable.   Objective:   Weight Range:  Vital Signs:   Temp:  [96.3 F (35.7 C)-99.5 F (37.5 C)] 98.1 F (36.7 C) (05/03 0753) Pulse Rate:  [44-102] 76 (05/03 0700) Resp:  [17-27] 19 (05/03 0700) BP: (93-125)/(61-92) 104/83 (05/03 0700) SpO2:  [91 %-100 %] 99 % (05/03 0700) Arterial Line BP: (100-120)/(56-76) 115/74 (05/03 0700) Weight:  [100.9 kg] 100.9 kg (05/03 0545) Last BM Date: 04/14/20  Weight change: Filed Weights   04/12/20 0525 04/13/20 0620 04/14/20 0545  Weight: 99.1 kg 98.4 kg 100.9 kg    Intake/Output:   Intake/Output Summary (Last 24 hours) at 04/14/2020 0754 Last data filed at 04/14/2020 0700 Gross per 24 hour  Intake 1749.09 ml  Output 0 ml  Net 1749.09 ml    PHYSICAL EXAM: CVP 6-7 General:  fatigue appearing. Confused/ mildly agitated.  No respiratory difficulty HEENT: normal + NGT Neck: supple.  JVD 7 cm. + Rt HD  cath. Carotids 2+ bilat; no bruits. No lymphadenopathy or thyromegaly appreciated. Cor: + LVAD HUM  Lungs: clear, no wheezing  Abdomen: soft, nontender, nondistended. No hepatosplenomegaly. No bruits or masses. Good bowel sounds. Drive-line: anchor in place. Clean/dry dressing  Extremities: no cyanosis, clubbing, rash, edema Neuro: alert but confused. cranial nerves grossly intact. moves all 4 extremities w/o difficulty. Appears mildly agitated this am.   Telemetry: AFib 80s  Personally reviewed    Labs: Basic Metabolic Panel: Recent Labs  Lab 04/10/20 0413 04/10/20 1633 04/11/20 0414 04/11/20 0414 04/11/20 1822 04/11/20 1822 04/12/20 0405 04/12/20 0405 04/12/20 1610 04/13/20 0418 04/14/20 0440  NA 138   < > 137   < > 137  --  138  --  136 138 136  K 4.3   < > 4.7   < > 4.5  --  4.6  --  4.9 5.2* 6.1*  CL 102   < > 102   < > 104  --  105  --  102 106 101  CO2 28   < > 25   < > 25  --  25  --  _0 GLUCOSE 137*   < > 120*   < > 103*  --  102*  --  107* 96 109*  BUN 31*   < > 37*   < > 36*  --  36*  --  32* 51* 96*  CREATININE 1.88*   < > 2.30*   < > 2.16*  --  2.12*  --  1.89* 2.87* 5.52*  CALCIUM 8.5*   < > 8.2*   < > 8.2*   < > 8.0*   < > 8.0* 7.8* 8.2*  MG 2.6*  --  2.4  --   --   --  2.6*  --   --  2.5* 2.9*  PHOS 2.6   < > 2.5   < > 2.8  --  2.8  --  2.6 2.9 4.2   < > = values in this interval not displayed.    Liver Function Tests: Recent Labs  Lab 04/11/20 1822 04/12/20 0405 04/12/20 1610 04/13/20 0418 04/14/20 0440  ALBUMIN 2.1* 2.0* 2.1* 2.0* 2.1*   No results for input(s): LIPASE, AMYLASE in the last 168 hours. No results for input(s): AMMONIA in the last 168 hours.  CBC: Recent Labs  Lab 04/10/20 0413 04/11/20 0414 04/12/20 0405 04/13/20 0418 04/14/20 0440  WBC 7.5 9.2 9.8 12.1* 12.0*  HGB 8.6* 8.3* 7.7* 7.8* 7.1*  HCT 28.0* 27.0* 24.8* 25.4* 23.5*  MCV 92.1 91.2 90.5 92.7 91.4  PLT 136* 137* 136* 175 193    Cardiac Enzymes: No  results for input(s): CKTOTAL, CKMB, CKMBINDEX, TROPONINI in the last 168 hours.  BNP: BNP (last 3 results) Recent Labs    03/26/20 2329 04/02/20 2342 04/10/20 0132  BNP 425.7* 332.7* 267.2*    ProBNP (last 3 results) No results for input(s): PROBNP in the last 8760 hours.    Other results:  Imaging: No results found.   Medications:     Scheduled Medications: . aspirin  81 mg Per Tube Daily  . atorvastatin  80 mg Per Tube q1800  . B-complex with vitamin C  1 tablet Per Tube Daily  . bisacodyl  10 mg Oral Daily   Or  . bisacodyl  10 mg Rectal Daily  . chlorhexidine gluconate (MEDLINE KIT)  15 mL Mouth Rinse BID  . Chlorhexidine Gluconate Cloth  6 each Topical Daily  . Chlorhexidine Gluconate Cloth  6 each Topical Q0600  . [START ON 04/15/2020] darbepoetin (ARANESP) injection - NON-DIALYSIS  100 mcg Subcutaneous Q Tue-1800  . feeding supplement (PRO-STAT SUGAR FREE 64)  30 mL Per Tube QID  . influenza vaccine adjuvanted  0.5 mL Intramuscular Tomorrow-1000  . insulin aspart  0-24 Units Subcutaneous Q4H  . insulin aspart  6 Units Subcutaneous Q4H  . insulin glargine  24 Units Subcutaneous BID  . mouth rinse  15 mL Mouth Rinse 10 times per day  . melatonin  3 mg Per Tube QHS  . midodrine  10 mg Per Tube TID WC  . pantoprazole (PROTONIX) IV  40 mg Intravenous Daily  . QUEtiapine  25 mg Per Tube QHS  . sodium chloride flush  10-40 mL Intracatheter Q12H  . Warfarin - Pharmacist Dosing Inpatient   Does not apply q1600    Infusions: . sodium chloride 10 mL/hr at 04/14/20 0700  . epinephrine Stopped (04/05/20 0854)  . feeding supplement (VITAL 1.5 CAL) 1,000 mL (04/13/20 2142)  . norepinephrine (LEVOPHED) Adult infusion Stopped (04/11/20 2215)  . vasopressin (PITRESSIN) infusion - *FOR SHOCK* Stopped (04/07/20 0515)    PRN Medications: sodium chloride, acetaminophen, dextrose, hydrALAZINE, levalbuterol, ondansetron (ZOFRAN) IV, oxyCODONE, sodium chloride flush,  sorbitol, traMADol   Assessment/Plan:   1. Acute on chronic systolic HF -> cardiogenic shock-> S/p HM3 LVAD -  Echo 2016 EF 30-35% - Echo 1/21 EF 25% - Admitted with NYHA IV symptoms and AKI with attempts at diuresis. Initial co-ox 39% - Echo this admit: EF 10-15% moderate RV dysfunction - R/LHC cath 3/21 with severe 3v CAD and low output with CI 1.7.  - Initially planned for CABG but given need for re-do sternotomy, relatively poor targets and longstanding low EF, VAD felt to be better option  - S/p HM3 VAD 4/8 - Speed turned up on 4/11 to  5400, but looking at 4/10 echo there is significant RV dysfunction with leftward septal shift so speed decreased back to 5300 rpm.  - Off milrinone. Off NE. MAP 70s-80s. CO-ox 73% - Now off CVVH. Will get first iHD on 5/3.   2. VAD - VAD interrogated personally. Parameters stable. - Had Pump Stop 04/03/20. Controller changed out with old controller sent back to [facility] for evaluation.  - on aspirin 81 mg daily + coumadin.  - LDH 271 - INR 2.5  Discussed dosing with PharmD personally. - Driveline ok   3. CAD with unstable angina - s/p previous PCI. - cath 02/12/2020 with severe 3v CAD - now s/p VAD on 03/30/2020   - No chest pain.   4. AKI on CKD 3a - Nephrology consulted. Started CVVHD 4/13.    - Renal US 4/5 unremarkable.  - Hopefully renal function will recover but Renal team not optimistic - He is now off NE w/ stable MAP.  - Off CVVH. BUN elevated w/ uremic symptoms.  iHD to start 5/3.  - K 6.1 >>to get HD today. Can use Lokelma PRN on non HD days.   5. Permanent AF - now s/p MAZE + LAA Clipping 4/8   - Rate controlled - On coumadin. INR 2.5  6. MVP s/p MV repair - On admit with recurrent severe MR on echo and with huge v-waves on PCWP tracing - s/p VAD  7. Hemothoax - s/p CT placement and VATs on 4/22 - CTs pulled 4/26 - stable  8. ID/leukocytosis - 4/11 procalcitonin 1.96  - Started empiric coverage for PNA with  vancomycin/cefepime for 7 days. Stopped 4/17 - Given recent VATs and high-dose pressor requirements he was treated with broad spectrum abx, linezolid and cefepime (completed course 4/28)  - Remains afebrile  9. Acute Hypoxic Respiratory Failure - Re-intubated 4/20 with hemothorax- - Extubated 4/25 - Remains stable on [place]  10. Malnutrition  - Albumin 2.6  - On tube feeds.  - Speech following, still NPO with ice chips, needs to be re-evaluated    11. CVA/anoxic brain injury - 03/27/20 CT large posterior MCA ischemic infarct, possibly from atrial fibrillation.  INR was supratherapeutic when CVA found.  Stable LDH, do not think partial pump thrombosis is the culprit. - Still with some L weakness and inattention but improving  - PT/OT/speech following.  - CIR consulted.   - Continue to mobilize, very weak.   12. Anemia, post-op  - Received 2u RBC 04/05/20 - Hgb 7.1.   - Transfuse if hemoglobin < 7.5  - Plan transfusion x 1 unit PRBCs today w/ HD    13. Hyperkalemia - 6.1 today, in the setting of renal failure  - Plan HD today  - can use Lokelma PRN on non HD days   14. DM2, poorly controlled - hgbA1c 11.1% - continue insulin   Length of Stay: [address], PA-C  7:54 AM  04/14/2020  Patient seen and examined with the above-signed  Advanced Practice Provider and/or Housestaff. I personally reviewed laboratory data, imaging studies and relevant notes. I independently examined the patient and formulated the important aspects of the plan. I have edited the note to reflect any of my changes or salient points. I have personally discussed the plan with the patient and/or family.  Remains anuric. He is uremic this am. Will arouse but speak minimally. Confused. Only able to tell me his name. Volume status ok. CVP 8. Co-ox ok. K is high. HGb 7.1  General:  Confused. Lethargic  HEENT: normal  Neck: supple. LIJ cath [facility] HD cath  Carotids 2+ bilat; no bruits. No lymphadenopathy or  thryomegaly appreciated. Cor: Sternum ok LVAD hum.  Lungs: Clear. Dull left base Abdomen: obese soft, nontender, non-distended. No hepatosplenomegaly. No bruits or masses. Good bowel sounds. Driveline site clean. Anchor in place.  Extremities: no cyanosis, clubbing, rash. Warm no edema  Neuro: lethargic. Confused. + asterixis  He is much worse today. Appears uremic. Discussed with Renal. Will plan HD this am. Will give 1u RBCs. Also discussed need for tunneled cath.   VAD interrogated personally. Parameters stable. INR 2.9. Discussed dosing with PharmD personally.  Glori Bickers, MD  5:45 PM

## 2020-04-14 NOTE — Progress Notes (Signed)
13 Days Post-Op Procedure(s) (LRB): VIDEO ASSISTED THORACOSCOPY (Left) TRANSESOPHAGEAL ECHOCARDIOGRAM (TEE) (N/A) Evacuation Hematoma (Left) Subjective: Not interactive  Objective: Vital signs in last 24 hours: Temp:  [96.3 F (35.7 C)-99.5 F (37.5 C)] 98.1 F (36.7 C) (05/03 0753) Pulse Rate:  [44-102] 89 (05/03 0900) Cardiac Rhythm: Atrial fibrillation (05/03 0800) Resp:  [17-27] 19 (05/03 0900) BP: (93-125)/(61-92) 117/89 (05/03 0900) SpO2:  [95 %-100 %] 95 % (05/03 0900) Arterial Line BP: (100-126)/(56-76) 120/72 (05/03 0900) Weight:  [100.9 kg] 100.9 kg (05/03 0545)  Hemodynamic parameters for last 24 hours: CVP:  [2 mmHg-12 mmHg] 6 mmHg  Intake/Output from previous day: 05/02 0701 - 05/03 0700 In: 1749.1 [I.V.:239.1; NG/GT:1510] Out: 0  Intake/Output this shift: Total I/O In: 120 [I.V.:20; NG/GT:100] Out: -   General appearance: uncooperative Neurologic: not following commands Heart: regular rate and rhythm, S1, S2 normal, no murmur, click, rub or gallop Lungs: clear to auscultation bilaterally Abdomen: soft, non-tender; bowel sounds normal; no masses,  no organomegaly Extremities: edema 1+ Wound: c/d/i  Lab Results: Recent Labs    04/13/20 0418 04/14/20 0440  WBC 12.1* 12.0*  HGB 7.8* 7.1*  HCT 25.4* 23.5*  PLT 175 193   BMET:  Recent Labs    04/13/20 0418 04/14/20 0440  NA 138 136  K 5.2* 6.1*  CL 106 101  CO2 25 25  GLUCOSE 96 109*  BUN 51* 96*  CREATININE 2.87* 5.52*  CALCIUM 7.8* 8.2*    PT/INR:  Recent Labs    04/14/20 0440  LABPROT 25.9*  INR 2.5*   ABG    Component Value Date/Time   PHART 7.383 04/07/2020 0802   HCO3 27.3 04/07/2020 0802   TCO2 29 04/07/2020 0802   ACIDBASEDEF 2.0 03/14/2020 1405   O2SAT 73.2 04/14/2020 0410   CBG (last 3)  Recent Labs    04/13/20 2347 04/14/20 0412 04/14/20 0751  GLUCAP 130* 103* 117*    Assessment/Plan: S/P Procedure(s) (LRB): VIDEO ASSISTED THORACOSCOPY  (Left) TRANSESOPHAGEAL ECHOCARDIOGRAM (TEE) (N/A) Evacuation Hematoma (Left) needs aggressive rehab  Agree with tunneled HD catheter    LOS: 33 days    W[facility] 04/14/2020

## 2020-04-14 NOTE — Progress Notes (Signed)
Inpatient Rehabilitation-Admissions Coordinator   Attempted bedside assessment for CIR. Pt appeared lethargic and did not interact with me today. Will continue to follow but if pt continues with limited interaction and participation, he may need SNF placement as he would not be able to tolerate a CIR level program.   Will continue to follow closely.   Raechel Ache, OTR/L  Rehab Admissions Coordinator  [phone] 04/14/2020 4:27 PM

## 2020-04-14 NOTE — Progress Notes (Signed)
  Speech Language Pathology Treatment: Dysphagia  Patient Details Name: [name] [name] MRN: [id number] DOB: Apr 12, 1951 Today's Date: 04/14/2020 Time: 1010-1020 SLP Time Calculation (min) (ACUTE ONLY): 10 min  Assessment / Plan / Recommendation Clinical Impression  Pt remains lethargic; he will not actively participate in eating and drinking, will not keep his eyes open or follow commands. If a spoon is placed to his lips and pt senses ice/puree/liquids pt did accept the bolus with adequate oral manipulation if attention sustained, with a swallow response with no coughing, though there were also instances where pt fell asleep with ice in his mouth. He does not phonate on command. He will not take straw sips with max cueing, hand over hand assist and verbal cues. Likley pt has waxing and waning arousal per RN report and has instances where he is more capable of intake. He is not really appropriate for meals, but could be spoon fed liquids and purees when alert. Discussed with RN. Main barrier to oral intake is arousal. Will follow for improvement for diet advancement.    HPI HPI: [name] [name] is a 69 year old gentleman with a history of ischemic cardiomyopathy who presented for an LVAD implantation and left atrial appendage clipping with sternal reconstruction on 4/8, extubated 4/9. Started CRRT on 2/72  He had a complicated postop course including a right MCA stroke on 4/15. Evaluated by SLP 4/17, started on a diet.  He had a cardiac arrest on 4/20 requiring reintubation until 4/25. BSE repeated 4/26, with recommendation for NPO. His mental status has been improving slowly for several days.      SLP Plan  Continue with current plan of care       Recommendations  Diet recommendations: Other(comment)(bites of puree, teaspoons of liquids ok) Liquids provided via: Teaspoon Medication Administration: Via alternative means Supervision: Staff to assist with self feeding;Full supervision/cueing  for compensatory strategies Compensations: Slow rate;Small sips/bites;Minimize environmental distractions Postural Changes and/or Swallow Maneuvers: Seated upright 90 degrees                Oral Care Recommendations: Oral care QID Follow up Recommendations: Skilled Nursing facility SLP Visit Diagnosis: Dysphagia, oropharyngeal phase (R13.12) Plan: Continue with current plan of care       GO                [name] [name], [name] [name] 04/14/2020, 10:47 AM

## 2020-04-14 NOTE — Progress Notes (Addendum)
ANTICOAGULATION CONSULT NOTE  Pharmacy Consult for warfarin Indication: post op LVAD  / Aflutter  Allergies  Allergen Reactions  . Liraglutide Nausea And Vomiting  . Lisinopril Cough    Patient Measurements: Height: 6' (182.9 cm) Weight: 103.8 kg (228 lb 13.4 oz) IBW/kg (Calculated) : 77.6 Heparin Dosing Weight: 101.2 kg  Vital Signs: Temp: 97.3 F (36.3 C) (05/03 1319) Temp Source: Axillary (05/03 1319) BP: 94/79 (05/03 1419) Pulse Rate: 91 (05/03 1419)  Labs: Recent Labs    04/12/20 0405 04/12/20 0405 04/12/20 1610 04/13/20 0418 04/14/20 0440  HGB 7.7*   < >  --  7.8* 7.1*  HCT 24.8*  --   --  25.4* 23.5*  PLT 136*  --   --  175 193  LABPROT 27.0*  --   --  29.0* 25.9*  INR 2.6*  --   --  2.9* 2.5*  CREATININE 2.12*   < > 1.89* 2.87* 5.52*   < > = values in this interval not displayed.    Estimated Creatinine Clearance: 15.5 mL/min (A) (by C-G formula based on SCr of 5.52 mg/dL (H)).   Medical History: Past Medical History:  Diagnosis Date  . Abdominal pain 01/05/2020  . Abnormal nuclear stress test 01/14/2015  . Acute on chronic combined systolic and diastolic CHF (congestive heart failure) ([place])   . Acute respiratory failure with hypoxia ([place])   . AKI (acute kidney injury) ([place])   . Anxiety   . Arthritis   . Atrial fibrillation ([place]) 11/29/2016  . Atrial fibrillation with RVR ([place])   . Atrial fibrillation, chronic ([place]) 11/29/2016   Last Assessment & Plan:  Formatting of this note might be different from the original. -continue present medications with warfarin anti-coagulation Last Assessment & Plan:  Formatting of this note might be different from the original. -chronic -anti-coagulated -rate controlled  . Automatic implantable cardioverter-defibrillator in situ 08/18/2011   Formatting of this note might be different from the original. Last Assessment & Plan:  Now followed in ICD clinic. He has followup in January.  Last Assessment & Plan:  Formatting of  this note might be different from the original. -historical note  . Benign prostatic hyperplasia   . Bipolar I disorder with depression ([place]) 01/06/2011   Last Assessment & Plan:  -historical note in chart -continue home medications  Last Assessment & Plan:  Formatting of this note might be different from the original. -historical note in chart -continue home medications  . CAD (coronary artery disease)    a.  1993 s/p MI - [employer] [facility];  b. s/p BMS to LAD '00;  c. PTCA 2nd diagonal 2010;  d. 02/18/12 Cath: moderate nonobs dzs - med rx;  e.  01/2015 Cath: LM nl, LAD 40-97m ISR, 70-70m/d, d1 90p (3.0x16 Synergy DES), D2 50-60, LCX nl, OM1 50p, 7m (2.5x12 Synergy DES), RCA nl, EF 30-35%.  . Cardiogenic shock ([place]) 03/21/2020  . Chronic anticoagulation 09/22/2018   Last Assessment & Plan:  -continue warfarin for atrial fibrillation with pharmacy to manage  Last Assessment & Plan:  Formatting of this note might be different from the original. -continue warfarin for atrial fibrillation with pharmacy to manage  . Chronic combined systolic and diastolic CHF (congestive heart failure) ([place])    a. 12/2014 Echo: EF 30-35%, Gr2 DD, mod MR, sev dil LA.  Marland Kitchen Chronic congestive heart failure ([place]) 04/22/2015   Last Assessment & Plan:  Formatting of this note might be different from the original. -known EF 20-25% -  continue medical management -appears stable clinically  Last Assessment & Plan:  Formatting of this note might be different from the original. -noted with abnormal EF and chronic activity restrictions -medically manage  . Chronic systolic dysfunction of left ventricle 07/25/2013  . CKD (chronic kidney disease) 09/22/2018  . CKD (chronic kidney disease), stage III   . Coagulopathy ([place])   . COVID-19 04/17/2019   Last Assessment & Plan:  -Apparent scattered infiltrates on CT chest.  -SARS Co V positive -afebrile and not hypoxic at rest; does qualify for home o2 with sats 88% on room air with activity -home today  on self-quarantine x 14 days -finish azithromycin at home -ask Home Health to check on patient Last Assessment & Plan:  -has persistently positive test since initial verification 04/16/19 -contract/d  . Depression   . Diabetic retinopathy associated with type 2 diabetes mellitus ([place]) 01/30/2020  . Diarrhea 01/05/2020  . Dizziness 09/27/2016  . DM (diabetes mellitus), type 2 ([place]) 03/19/2013   Last Assessment & Plan:  Formatting of this note might be different from the original. -hold metformin -reduce insulin with accuchecks and SSI -diet consult -HbA1c  . DNR (do not resuscitate) discussion 03/18/2013  . ED (erectile dysfunction)   . Elevated troponin 09/22/2018   Last Assessment & Plan:  Formatting of this note might be different from the original. -trend -mild elevation not thought to be ischemic on admission from history  . GERD (gastroesophageal reflux disease)   . Hyperlipidemia   . Hypertension   . ICD (implantable cardiac defibrillator) battery depletion 03/18/2013  . IHD (ischemic heart disease) 01/08/2019   Formatting of this note might be different from the original. Last Assessment & Plan:  Minimal symptoms of angina. He is on Ranexa, isosorbide, and a beta blocker. We'll try to maximize his blood pressure therapy. I stressed the importance of regular daily aerobic exercise. He needs to follow a heart healthy/diabetic diet.  . Ischemic cardiomyopathy    a. s/p [employer] [name] (Atlas) ICD implanted in [place] 2007;  b. 12/2014 Echo: Ef 30-35%.  . Ischemic dilated cardiomyopathy ([place]) 03/15/2020  . LVAD (left ventricular assist device) present ([place]) 03/21/2020  . MVP (mitral valve prolapse)    a. s/p MV annuloplasty at [facility] 2004.  . Non-rheumatic mitral regurgitation 01/08/2019   Formatting of this note might be different from the original. Last Assessment & Plan:  He does have a murmur of mitral insufficiency by exam. We will followup an echocardiogram to assess the success of his mitral valve  repair.  . Other chest pain 06/01/2019   Last Assessment & Plan:  Formatting of this note might be different from the original. -known COVID19 with known CAD and cardiomyopathy -trend troponins -consider other etiologies like cardiac involvement with COVID19 -consult DR Gavin Potters -consider echo (although none available until 6/22) -await CT thorax result  . Palliative care by specialist   . Persistent atrial fibrillation ([place])    a. noted on ICD interrogation '10 - not previously on [place] - CHA2DS2VASc = 5.  . Pneumonia, unspecified organism 09/22/2018  . S/P coronary artery stent placement   . Severe episode of recurrent major depressive disorder, without psychotic features ([place]) 02/07/2020  . Shortness of breath   . Syncope 09/15/2012  . Tick bite 06/02/2019   Last Assessment & Plan:  -reported by ER staff; had tick on umbilicus (removed) -monitor for any signs requiring treatment (I.e. rash, fever)  Last Assessment & Plan:  Formatting of this note might  be different from the original. -reported by ER staff; had tick on umbilicus (removed) -monitor for any signs requiring treatment (I.e. rash, fever)  . TSH elevation   . Type II diabetes mellitus (H[facility])    uncontrolled  . Unstable angina ([place]) 02/19/2012    Medications:  Medications Prior to Admission  Medication Sig Dispense Refill Last Dose  . ALPRAZolam (XANAX) 0.25 MG tablet Take 0.25 mg by mouth 3 (three) times daily as needed for anxiety.    unknown  . amiodarone (PACERONE) 200 MG tablet Take 200 mg by mouth daily.    03/11/2020 at Unknown time  . apixaban (ELIQUIS) 5 MG TABS tablet Take 1 tablet (5 mg total) by mouth 2 (two) times daily. 60 tablet 1 03/11/2020 at 0600  . atorvastatin (LIPITOR) 40 MG tablet Take 1 tablet (40 mg total) by mouth daily at 6 PM. 30 tablet 0 03/10/2020 at Unknown time  . citalopram (CELEXA) 20 MG tablet Take 20 mg by mouth daily.    03/11/2020 at Unknown time  . furosemide (LASIX) 40 MG tablet Take 40 mg by mouth  daily.    03/11/2020 at Unknown time  . insulin aspart (NOVOLOG FLEXPEN) 100 UNIT/ML FlexPen Inject 6 Units into the skin 3 (three) times daily with meals. 15 mL 0 03/11/2020 at Unknown time  . Insulin Glargine (BASAGLAR KWIKPEN) 100 UNIT/ML SOPN Inject 0.5 mLs (50 Units total) into the skin daily. 15 mL 1 03/11/2020 at Unknown time  . metoprolol succinate (TOPROL-XL) 100 MG 24 hr tablet Take 1 tablet (100 mg total) by mouth daily. 90 tablet 3 03/11/2020 at 0600  . nitroGLYCERIN (NITROSTAT) 0.4 MG SL tablet Place 1 tablet (0.4 mg total) under the tongue every 5 (five) minutes x 3 doses as needed for chest pain. 10 tablet 0 unknown  . pantoprazole (PROTONIX) 40 MG tablet Take 1 tablet (40 mg total) by mouth daily. 30 tablet 1 03/11/2020 at Unknown time  . potassium chloride SA (KLOR-CON) 20 MEQ tablet Take 20 mEq by mouth daily.    03/11/2020 at Unknown time  . tamsulosin (FLOMAX) 0.4 MG CAPS capsule Take 1 capsule (0.4 mg total) by mouth daily. 30 capsule 0 03/11/2020 at Unknown time  . ALPRAZolam (XANAX) 1 MG tablet Take 1 tablet (1 mg total) by mouth 2 (two) times daily as needed for anxiety. (Patient not taking: Reported on 02/18/2020) 10 tablet 0 Not Taking at Unknown time    Assessment: 74 YOM with atrial fibrillation on Eliquis PTA s/p R & L heart cath found to have 3v disease s/p  LVAD  Implant 4/8. Warfarin initiated on 4/9 after discussing with MD.  CT on 4/15 now found to have large right MCA stroke.   Warfarin resumed cautiously on 4/17. Held doses since 4/20 since he went to the OR for VATS. Warfarin was cautiously resumed on 4/23.  INR down to 2.5, at goal. Hgb down 7.1 will receive blood today, Pltc improved. LDH 200s. No bleeding noted. No warfarin given yesterday, will hold today as he may need tunneled access if he tolerated iHD.   Goal of Therapy:  INR 2-2.5 Monitor platelets by anticoagulation protocol: Yes   Plan:  Hold warfarin tonight Daily INR  Erin Hearing PharmD.,  BCPS Clinical Pharmacist 04/14/2020 2:34 PM

## 2020-04-15 ENCOUNTER — Inpatient Hospital Stay (HOSPITAL_COMMUNITY): Payer: Medicare HMO

## 2020-04-15 DIAGNOSIS — R570 Cardiogenic shock: Secondary | ICD-10-CM | POA: Diagnosis not present

## 2020-04-15 LAB — CBC
HCT: 23.4 % — ABNORMAL LOW (ref 39.0–52.0)
HCT: 26.1 % — ABNORMAL LOW (ref 39.0–52.0)
Hemoglobin: 7.4 g/dL — ABNORMAL LOW (ref 13.0–17.0)
Hemoglobin: 8.4 g/dL — ABNORMAL LOW (ref 13.0–17.0)
MCH: 28.8 pg (ref 26.0–34.0)
MCH: 28.7 pg (ref 26.0–34.0)
MCHC: 31.6 g/dL (ref 30.0–36.0)
MCHC: 32.2 g/dL (ref 30.0–36.0)
MCV: 91.1 fL (ref 80.0–100.0)
MCV: 89.1 fL (ref 80.0–100.0)
Platelets: 206 10*3/uL (ref 150–400)
Platelets: 226 10*3/uL (ref 150–400)
RBC: 2.57 MIL/uL — ABNORMAL LOW (ref 4.22–5.81)
RBC: 2.93 MIL/uL — ABNORMAL LOW (ref 4.22–5.81)
RDW: 15.9 % — ABNORMAL HIGH (ref 11.5–15.5)
RDW: 15.8 % — ABNORMAL HIGH (ref 11.5–15.5)
WBC: 9.3 10*3/uL (ref 4.0–10.5)
WBC: 8.4 10*3/uL (ref 4.0–10.5)
nRBC: 0 % (ref 0.0–0.2)
nRBC: 0 % (ref 0.0–0.2)

## 2020-04-15 LAB — PROTIME-INR
INR: 2.1 — ABNORMAL HIGH (ref 0.8–1.2)
Prothrombin Time: 22.4 seconds — ABNORMAL HIGH (ref 11.4–15.2)

## 2020-04-15 LAB — COOXEMETRY PANEL
Carboxyhemoglobin: 2.3 % — ABNORMAL HIGH (ref 0.5–1.5)
Methemoglobin: 0.7 % (ref 0.0–1.5)
O2 Saturation: 68.5 %
Total hemoglobin: 6.6 g/dL — CL (ref 12.0–16.0)

## 2020-04-15 LAB — PREPARE RBC (CROSSMATCH)

## 2020-04-15 LAB — GLUCOSE, CAPILLARY
Glucose-Capillary: 133 mg/dL — ABNORMAL HIGH (ref 70–99)
Glucose-Capillary: 158 mg/dL — ABNORMAL HIGH (ref 70–99)
Glucose-Capillary: 70 mg/dL (ref 70–99)
Glucose-Capillary: 72 mg/dL (ref 70–99)
Glucose-Capillary: 77 mg/dL (ref 70–99)
Glucose-Capillary: 88 mg/dL (ref 70–99)

## 2020-04-15 LAB — RENAL FUNCTION PANEL
Albumin: 1.9 g/dL — ABNORMAL LOW (ref 3.5–5.0)
Anion gap: 14 (ref 5–15)
BUN: 71 mg/dL — ABNORMAL HIGH (ref 8–23)
CO2: 27 mmol/L (ref 22–32)
Calcium: 8.1 mg/dL — ABNORMAL LOW (ref 8.9–10.3)
Chloride: 96 mmol/L — ABNORMAL LOW (ref 98–111)
Creatinine, Ser: 4.54 mg/dL — ABNORMAL HIGH (ref 0.61–1.24)
GFR calc Af Amer: 14 mL/min — ABNORMAL LOW (ref >60)
GFR calc non Af Amer: 12 mL/min — ABNORMAL LOW (ref >60)
Glucose, Bld: 71 mg/dL (ref 70–99)
Phosphorus: 5.2 mg/dL — ABNORMAL HIGH (ref 2.5–4.6)
Potassium: 4.4 mmol/L (ref 3.5–5.1)
Sodium: 137 mmol/L (ref 135–145)

## 2020-04-15 LAB — MAGNESIUM: Magnesium: 2.6 mg/dL — ABNORMAL HIGH (ref 1.7–2.4)

## 2020-04-15 LAB — LACTATE DEHYDROGENASE: LDH: 241 U/L — ABNORMAL HIGH (ref 98–192)

## 2020-04-15 MED ORDER — SODIUM CHLORIDE 0.9% IV SOLUTION: Freq: Once | INTRAVENOUS | Status: AC

## 2020-04-15 MED ORDER — CEFAZOLIN SODIUM-DEXTROSE 2-4 GM/100ML-% IV SOLN
2.0000 g | INTRAVENOUS | Status: AC
  Filled 2020-04-15: qty 100

## 2020-04-15 MED ORDER — WARFARIN 0.5 MG HALF TABLET
0.5000 mg | ORAL_TABLET | Freq: Once | ORAL | Status: AC
  Administered 2020-04-15: 0.5 mg via ORAL
  Filled 2020-04-15: qty 1

## 2020-04-15 NOTE — Progress Notes (Signed)
Patient ID: [name] [name], male   DOB: 1951/07/03, 69 y.o.   MRN: [id number] S: Resting comfortably.  Tolerated IHD yesterday with 1 liter UF.  For [facility] placement today and removal of temp HD cath O:BP 103/88 (BP Location: Left Arm)   Pulse 79   Temp 98 F (36.7 C) (Oral)   Resp 15   Ht 6' (1.829 m)   Wt 103.9 kg   SpO2 94%   BMI 31.07 kg/m   Intake/Output Summary (Last 24 hours) at 04/15/2020 0956 Last data filed at 04/15/2020 0800 Gross per 24 hour  Intake 1672.7 ml  Output 1009 ml  Net 663.7 ml   Intake/Output: I/O last 3 completed shifts: In: 3131.8 [I.V.:454.3; Blood:340; Other:10; NG/GT:2327.5] Out: 1009 [Other:1009]  Intake/Output this shift:  Total I/O In: 10 [I.V.:10] Out: -  Weight change: 2.9 kg Gen: NAD CVS: no rub Resp: cta Abd: +BS, soft, NT/ND Ext: no edema  Recent Labs  Lab 04/11/20 0414 04/11/20 1822 04/12/20 0405 04/12/20 1610 04/13/20 0418 04/14/20 0440 04/15/20 0417  NA 137 137 138 136 138 136 137  K 4.7 4.5 4.6 4.9 5.2* 6.1* 4.4  CL 102 104 105 102 106 101 96*  CO2 _0 GLUCOSE 120* 103* 102* 107* 96 109* 71  BUN 37* 36* 36* 32* 51* 96* 71*  CREATININE 2.30* 2.16* 2.12* 1.89* 2.87* 5.52* 4.54*  ALBUMIN 2.1* 2.1* 2.0* 2.1* 2.0* 2.1* 1.9*  CALCIUM 8.2* 8.2* 8.0* 8.0* 7.8* 8.2* 8.1*  PHOS 2.5 2.8 2.8 2.6 2.9 4.2 5.2*   Liver Function Tests: Recent Labs  Lab 04/13/20 0418 04/14/20 0440 04/15/20 0417  ALBUMIN 2.0* 2.1* 1.9*   No results for input(s): LIPASE, AMYLASE in the last 168 hours. No results for input(s): AMMONIA in the last 168 hours. CBC: Recent Labs  Lab 04/12/20 0405 04/12/20 0405 04/13/20 0418 04/13/20 0418 04/14/20 0440 04/14/20 1622 04/15/20 0445  WBC 9.8   < > 12.1*   < > 12.0* 8.7 9.3  HGB 7.7*   < > 7.8*   < > 7.1* 7.5* 7.4*  HCT 24.8*   < > 25.4*   < > 23.5* 23.4* 23.4*  MCV 90.5  --  92.7  --  91.4 88.6 91.1  PLT 136*   < > 175   < > 193 196 206   < > = values in this interval not  displayed.   Cardiac Enzymes: No results for input(s): CKTOTAL, CKMB, CKMBINDEX, TROPONINI in the last 168 hours. CBG: Recent Labs  Lab 04/14/20 1525 04/14/20 1948 04/14/20 2338 04/15/20 0345 04/15/20 0743  GLUCAP 126* 133* 155* 72 70    Iron Studies: No results for input(s): IRON, TIBC, TRANSFERRIN, FERRITIN in the last 72 hours. Studies/Results: DG Chest Port 1 View  Result Date: 04/15/2020 CLINICAL DATA:  Chest pain EXAM: PORTABLE CHEST 1 VIEW COMPARISON:  April 10, 2020 FINDINGS: There is consolidation in the left base region with small left pleural effusion. There is fibrotic change in the lung bases superimposed. Feeding tube tip is below the diaphragm. There is cardiomegaly. There is a left ventricular assist device. Pacemaker leads are attached to the right atrium and right ventricle. There is a left atrial appendage clamp. The pulmonary vascularity is within normal limits. No adenopathy. There is aortic atherosclerosis. No bone lesions. IMPRESSION: Consolidation left lower lobe concerning for pneumonia. Small left pleural effusion. Underlying bibasilar fibrosis. Cardiomegaly with postoperative changes including left ventricular assist device, pacemaker with leads attached  to right atrium and right ventricle, and apparent left atrial appendage clamp. Feeding tube tip below diaphragm. Aortic Atherosclerosis (ICD10-I70.0). Electronically Signed   By: Lowella Grip III M.D.   On: 04/15/2020 08:52   . aspirin  81 mg Per Tube Daily  . atorvastatin  80 mg Per Tube q1800  . B-complex with vitamin C  1 tablet Per Tube Daily  . bisacodyl  10 mg Oral Daily   Or  . bisacodyl  10 mg Rectal Daily  . chlorhexidine gluconate (MEDLINE KIT)  15 mL Mouth Rinse BID  . Chlorhexidine Gluconate Cloth  6 each Topical Daily  . Chlorhexidine Gluconate Cloth  6 each Topical Q0600  . darbepoetin (ARANESP) injection - NON-DIALYSIS  100 mcg Subcutaneous Q Tue-1800  . feeding supplement (PRO-STAT SUGAR  FREE 64)  30 mL Per Tube BID  . influenza vaccine adjuvanted  0.5 mL Intramuscular Tomorrow-1000  . insulin aspart  0-24 Units Subcutaneous Q4H  . insulin aspart  6 Units Subcutaneous Q4H  . insulin glargine  24 Units Subcutaneous BID  . mouth rinse  15 mL Mouth Rinse 10 times per day  . midodrine  5 mg Per Tube TID WC  . pantoprazole (PROTONIX) IV  40 mg Intravenous Daily  . sodium chloride flush  10-40 mL Intracatheter Q12H  . sodium zirconium cyclosilicate  10 g Oral Daily  . Warfarin - Pharmacist Dosing Inpatient   Does not apply q1600    BMET    Component Value Date/Time   NA 137 04/15/2020 0417   NA 132 (L) 03/07/2020 1501   K 4.4 04/15/2020 0417   CL 96 (L) 04/15/2020 0417   CO2 27 04/15/2020 0417   GLUCOSE 71 04/15/2020 0417   BUN 71 (H) 04/15/2020 0417   BUN 16 03/07/2020 1501   CREATININE 4.54 (H) 04/15/2020 0417   CREATININE 1.29 (H) 12/10/2016 1011   CALCIUM 8.1 (L) 04/15/2020 0417   GFRNONAA 12 (L) 04/15/2020 0417   GFRNONAA 57 (L) 01/28/2015 1558   GFRAA 14 (L) 04/15/2020 0417   GFRAA 66 01/28/2015 1558   CBC    Component Value Date/Time   WBC 9.3 04/15/2020 0445   RBC 2.57 (L) 04/15/2020 0445   HGB 7.4 (L) 04/15/2020 0445   HGB 15.8 04/24/2018 1148   HCT 23.4 (L) 04/15/2020 0445   HCT 45.9 04/24/2018 1148   PLT 206 04/15/2020 0445   PLT 281 04/24/2018 1148   MCV 91.1 04/15/2020 0445   MCV 89 04/24/2018 1148   MCH 28.8 04/15/2020 0445   MCHC 31.6 04/15/2020 0445   RDW 15.9 (H) 04/15/2020 0445   RDW 14.2 04/24/2018 1148   LYMPHSABS 0.4 (L) 04/02/2020 0429   LYMPHSABS 1.1 04/24/2018 1148   MONOABS 2.2 (H) 04/02/2020 0429   EOSABS 0.0 04/02/2020 0429   EOSABS 0.2 04/24/2018 1148   BASOSABS 0.1 04/02/2020 0429   BASOSABS 0.1 04/24/2018 1148     Assessment/Plan:  1. AKI, in setting of cardiogenic shock, pressors, s/p LVAD 03/31/2020.  CRRT initiated on 03/25/20 and stopped 04/12/20 since he is off of pressors.  Tolerated his first intermittent HD  04/14/20 1. Plan for HD again tomorrow.  2. No heparin with HD on systemic anticoagulation 2. Hyperkalemia- improved with HD 3. Vascular access- will need tunneled HD catheter and removal of temp HD cath as it has been in place for more than 2 weeks.   1. Appreciate IR consult for [facility] placement and removal of temp HD cath today. 4. sCHF  s/p LVAD 03/26/2020 5. A fib s/p MAZE and LAA clipping 4/8 6. R MCA ischemic stroke- large, seen on CT on 03/27/20. 7. Left hemothorax s/p VATS 04/10/2020. 8. Anemia- s/p transfusion 04/14/20 without significant improvement of Hgb.  Continue to follow.  9. MVP s/p MV repair  Donetta Potts, MD [facility] [phone]

## 2020-04-15 NOTE — Progress Notes (Addendum)
Patient ID: [name] [name], male   DOB: 1950/12/25, 69 y.o.   MRN: [id number]   Consult note in place 5/3 pr K Zigmund Daniel PAC-IR  Note today from Dr Marval Regal- requesting [name] [name] Hemodialysis catheter placement and removal of temp cath Tolerated dialysis yesterday and wants to move ahead with new placement  I have spoken to Dtr [name] [name] via phone and she consents to procedure  Risks and benefits discussed with the patient's daughter via phone including, but not limited to bleeding, infection, vascular injury, pneumothorax which may require chest tube placement, air embolism or even death  All questions were answered, she is agreeable to proceed. Consent signed and in chart.

## 2020-04-15 NOTE — Progress Notes (Signed)
ANTICOAGULATION CONSULT NOTE  Pharmacy Consult for warfarin Indication: post op LVAD  / Aflutter  Allergies  Allergen Reactions  . Liraglutide Nausea And Vomiting  . Lisinopril Cough    Patient Measurements: Height: 6' (182.9 cm) Weight: 103.9 kg (229 lb 0.9 oz) IBW/kg (Calculated) : 77.6 Heparin Dosing Weight: 101.2 kg  Vital Signs: Temp: 98 F (36.7 C) (05/04 0704) Temp Source: Oral (05/04 0704) BP: 93/57 (05/04 0704) Pulse Rate: 79 (05/04 0825)  Labs: Recent Labs    04/13/20 0418 04/13/20 0418 04/14/20 0440 04/14/20 0440 04/14/20 1622 04/15/20 0417 04/15/20 0445  HGB 7.8*   < > 7.1*   < > 7.5*  --  7.4*  HCT 25.4*   < > 23.5*  --  23.4*  --  23.4*  PLT 175   < > 193  --  196  --  206  LABPROT 29.0*  --  25.9*  --   --  22.4*  --   INR 2.9*  --  2.5*  --   --  2.1*  --   CREATININE 2.87*  --  5.52*  --   --  4.54*  --    < > = values in this interval not displayed.    Estimated Creatinine Clearance: 18.9 mL/min (A) (by C-G formula based on SCr of 4.54 mg/dL (H)).   Medical History: Past Medical History:  Diagnosis Date  . Abdominal pain 01/05/2020  . Abnormal nuclear stress test 01/14/2015  . Acute on chronic combined systolic and diastolic CHF (congestive heart failure) ([place])   . Acute respiratory failure with hypoxia ([place])   . AKI (acute kidney injury) ([place])   . Anxiety   . Arthritis   . Atrial fibrillation ([place]) 11/29/2016  . Atrial fibrillation with RVR ([place])   . Atrial fibrillation, chronic ([place]) 11/29/2016   Last Assessment & Plan:  Formatting of this note might be different from the original. -continue present medications with warfarin anti-coagulation Last Assessment & Plan:  Formatting of this note might be different from the original. -chronic -anti-coagulated -rate controlled  . Automatic implantable cardioverter-defibrillator in situ 08/18/2011   Formatting of this note might be different from the original. Last Assessment & Plan:  Now followed in  ICD clinic. He has followup in January.  Last Assessment & Plan:  Formatting of this note might be different from the original. -historical note  . Benign prostatic hyperplasia   . Bipolar I disorder with depression ([place]) 01/06/2011   Last Assessment & Plan:  -historical note in chart -continue home medications  Last Assessment & Plan:  Formatting of this note might be different from the original. -historical note in chart -continue home medications  . CAD (coronary artery disease)    a.  1993 s/p MI - [employer];  b. s/p BMS to LAD '00;  c. PTCA 2nd diagonal 2010;  d. 02/18/12 Cath: moderate nonobs dzs - med rx;  e.  01/2015 Cath: LM nl, LAD 40-2m ISR, 70-16m/d, d1 90p (3.0x16 Synergy DES), D2 50-60, LCX nl, OM1 50p, 88m (2.5x12 Synergy DES), RCA nl, EF 30-35%.  . Cardiogenic shock ([place]) 03/21/2020  . Chronic anticoagulation 09/22/2018   Last Assessment & Plan:  -continue warfarin for atrial fibrillation with pharmacy to manage  Last Assessment & Plan:  Formatting of this note might be different from the original. -continue warfarin for atrial fibrillation with pharmacy to manage  . Chronic combined systolic and diastolic CHF (congestive heart failure) ([place])    a. 12/2014  Echo: EF 30-35%, Gr2 DD, mod MR, sev dil LA.  Marland Kitchen Chronic congestive heart failure ([place]) 04/22/2015   Last Assessment & Plan:  Formatting of this note might be different from the original. -known EF 20-25% -continue medical management -appears stable clinically  Last Assessment & Plan:  Formatting of this note might be different from the original. -noted with abnormal EF and chronic activity restrictions -medically manage  . Chronic systolic dysfunction of left ventricle 07/25/2013  . CKD (chronic kidney disease) 09/22/2018  . CKD (chronic kidney disease), stage III   . Coagulopathy ([place])   . COVID-19 04/17/2019   Last Assessment & Plan:  -Apparent scattered infiltrates on CT chest.  -SARS Co V positive -afebrile and not hypoxic at  rest; does qualify for home o2 with sats 88% on room air with activity -home today on self-quarantine x 14 days -finish azithromycin at home -ask Home Health to check on patient Last Assessment & Plan:  -has persistently positive test since initial verification 04/16/19 -contract/d  . Depression   . Diabetic retinopathy associated with type 2 diabetes mellitus ([place]) 01/30/2020  . Diarrhea 01/05/2020  . Dizziness 09/27/2016  . DM (diabetes mellitus), type 2 ([place]) 03/19/2013   Last Assessment & Plan:  Formatting of this note might be different from the original. -hold metformin -reduce insulin with accuchecks and SSI -diet consult -HbA1c  . DNR (do not resuscitate) discussion 03/18/2013  . ED (erectile dysfunction)   . Elevated troponin 09/22/2018   Last Assessment & Plan:  Formatting of this note might be different from the original. -trend -mild elevation not thought to be ischemic on admission from history  . GERD (gastroesophageal reflux disease)   . Hyperlipidemia   . Hypertension   . ICD (implantable cardiac defibrillator) battery depletion 03/18/2013  . IHD (ischemic heart disease) 01/08/2019   Formatting of this note might be different from the original. Last Assessment & Plan:  Minimal symptoms of angina. He is on Ranexa, isosorbide, and a beta blocker. We'll try to maximize his blood pressure therapy. I stressed the importance of regular daily aerobic exercise. He needs to follow a heart healthy/diabetic diet.  . Ischemic cardiomyopathy    a. s/p St. [name] (Atlas) ICD implanted in [place] 2007;  b. 12/2014 Echo: Ef 30-35%.  . Ischemic dilated cardiomyopathy ([place]) 03/19/2020  . LVAD (left ventricular assist device) present ([place]) 03/21/2020  . MVP (mitral valve prolapse)    a. s/p MV annuloplasty at [facility] 2004.  . Non-rheumatic mitral regurgitation 01/08/2019   Formatting of this note might be different from the original. Last Assessment & Plan:  He does have a murmur of mitral insufficiency by  exam. We will followup an echocardiogram to assess the success of his mitral valve repair.  . Other chest pain 06/01/2019   Last Assessment & Plan:  Formatting of this note might be different from the original. -known COVID19 with known CAD and cardiomyopathy -trend troponins -consider other etiologies like cardiac involvement with COVID19 -consult DR Gavin Potters -consider echo (although none available until 6/22) -await CT thorax result  . Palliative care by specialist   . Persistent atrial fibrillation ([place])    a. noted on ICD interrogation '10 - not previously on [place] - CHA2DS2VASc = 5.  . Pneumonia, unspecified organism 09/22/2018  . S/P coronary artery stent placement   . Severe episode of recurrent major depressive disorder, without psychotic features ([place]) 02/07/2020  . Shortness of breath   . Syncope 09/15/2012  . Tick  bite 06/02/2019   Last Assessment & Plan:  -reported by ER staff; had tick on umbilicus (removed) -monitor for any signs requiring treatment (I.e. rash, fever)  Last Assessment & Plan:  Formatting of this note might be different from the original. -reported by ER staff; had tick on umbilicus (removed) -monitor for any signs requiring treatment (I.e. rash, fever)  . TSH elevation   . Type II diabetes mellitus ([facility])    uncontrolled  . Unstable angina ([place]) 02/19/2012    Medications:  Medications Prior to Admission  Medication Sig Dispense Refill Last Dose  . ALPRAZolam (XANAX) 0.25 MG tablet Take 0.25 mg by mouth 3 (three) times daily as needed for anxiety.    unknown  . amiodarone (PACERONE) 200 MG tablet Take 200 mg by mouth daily.    03/11/2020 at Unknown time  . apixaban (ELIQUIS) 5 MG TABS tablet Take 1 tablet (5 mg total) by mouth 2 (two) times daily. 60 tablet 1 03/11/2020 at 0600  . atorvastatin (LIPITOR) 40 MG tablet Take 1 tablet (40 mg total) by mouth daily at 6 PM. 30 tablet 0 03/10/2020 at Unknown time  . citalopram (CELEXA) 20 MG tablet Take 20 mg by mouth daily.     03/11/2020 at Unknown time  . furosemide (LASIX) 40 MG tablet Take 40 mg by mouth daily.    03/11/2020 at Unknown time  . insulin aspart (NOVOLOG FLEXPEN) 100 UNIT/ML FlexPen Inject 6 Units into the skin 3 (three) times daily with meals. 15 mL 0 03/11/2020 at Unknown time  . Insulin Glargine (BASAGLAR KWIKPEN) 100 UNIT/ML SOPN Inject 0.5 mLs (50 Units total) into the skin daily. 15 mL 1 03/11/2020 at Unknown time  . metoprolol succinate (TOPROL-XL) 100 MG 24 hr tablet Take 1 tablet (100 mg total) by mouth daily. 90 tablet 3 03/11/2020 at 0600  . nitroGLYCERIN (NITROSTAT) 0.4 MG SL tablet Place 1 tablet (0.4 mg total) under the tongue every 5 (five) minutes x 3 doses as needed for chest pain. 10 tablet 0 unknown  . pantoprazole (PROTONIX) 40 MG tablet Take 1 tablet (40 mg total) by mouth daily. 30 tablet 1 03/11/2020 at Unknown time  . potassium chloride SA (KLOR-CON) 20 MEQ tablet Take 20 mEq by mouth daily.    03/11/2020 at Unknown time  . tamsulosin (FLOMAX) 0.4 MG CAPS capsule Take 1 capsule (0.4 mg total) by mouth daily. 30 capsule 0 03/11/2020 at Unknown time  . ALPRAZolam (XANAX) 1 MG tablet Take 1 tablet (1 mg total) by mouth 2 (two) times daily as needed for anxiety. (Patient not taking: Reported on 02/24/2020) 10 tablet 0 Not Taking at Unknown time    Assessment: 69 YOM with atrial fibrillation on Eliquis PTA s/p R & L heart cath found to have 3v disease s/p  LVAD  Implant 4/8. Warfarin initiated on 4/9 after discussing with MD.  CT on 4/15 now found to have large right MCA stroke.   Warfarin resumed cautiously on 4/17. Held doses since 4/20 since he went to the OR for VATS. Warfarin was cautiously resumed on 4/23.  INR down to 2.1, at goal. Hgb up 7.4, Pltc improved. LDH 240s. No bleeding. After discussion with HF team, concern over bleeding so will lower INR goal for now and repeat transfusion.   Warfarin held yesterday for possible HD cath placement, which is scheduled for later today.    Goal of Therapy:  INR 1.8-2.3 Monitor platelets by anticoagulation protocol: Yes   Plan:  Warfarin 0.5mg  later  tonight Daily INR  Erin Hearing PharmD., BCPS Clinical Pharmacist 04/15/2020 8:25 AM

## 2020-04-15 NOTE — Progress Notes (Signed)
CRITICAL VALUE ALERT  Critical Value:  Total Hemoglobin from CoOx 6.6   Date & Time Notied:  04/15/2020 at [place]  Provider Notified: Awaiting other labs before notifying- verifying HgB with CBC  Orders Received/Actions taken: No new actions at this time

## 2020-04-15 NOTE — Progress Notes (Signed)
Physical Therapy Treatment Patient Details Name: [name] [name] MRN: [id number] DOB: 12-Feb-1951 Today's Date: 04/15/2020    History of Present Illness 69 yo male with history of CAD with prior stenting of the LAD, Diagonal and OM in an outside hospital, ischemic cardiomyopathy with ICD in place, chronic systolic CHF, MVP s/p mitral valve repair, permanent atrial fib on chronic anticoagulation, HTN, HLD, DM, depression admitted to [facility] with progressive weakness, fatigue, dyspnea and chest pressure. Troponin elevated at 0.06. He was transferred to [facility] for cardiac cath, showing severe 3 vessel disease. Plan for CABG but pt with AKI. Pt underwent multiple tooth extraction on 4/6 and LVAD placement on 4/8. Postop day 7 patient was noted to be agitated the CT head was obtained showing a large posterior right MCA infarct. Pt with arrest on 4/20 and reintubated, extubated 4/25. Pt on CRRT until 04/12/20 when transitioned to HD.    PT Comments    Pt with improved cognition and participation from 5/3. Pt able to stand 1 trial with 1 person mod assist. Pt fatigues quickly and seems irritable with further standing trials and mobility progression with pt reporting fatigue. Pt joking, smiling and tapping fingers to music during session but appears easily frustrated and overwhelmed when mobility becomes difficult. Pt reports desire to get better and move. Pt did perform limited HEP in bed after standing trials. Rn agreeable to plan for lift OOB to chair after IR today.   HR 76 96% on RA 100/89 (94)    Follow Up Recommendations  CIR;Supervision/Assistance - 24 hour;LTACH     Equipment Recommendations  Other (comment)(TBD)    Recommendations for Other Services       Precautions / Restrictions Precautions Precautions: Fall;Sternal Precaution Comments: LVAD, cortrak, flexiseal    Mobility  Bed Mobility Overal bed mobility: Needs Assistance Bed Mobility: Rolling           General  bed mobility comments: min assist to roll with cues for sequence. utilized foot egress to transition to sitting and back to supine.  Transfers Overall transfer level: Needs assistance   Transfers: Sit to/from Stand Sit to Stand: +2 physical assistance;From elevated surface;Mod assist         General transfer comment: in bed egress pt min assist for shifting trunk anteriorly and mod assist to scoot pelvis. pt then attempted to stand without ability to rise intially but on second trials with 1 person assist with face to face technique pt stood 20 sec with faciliation and cues for trunk extension. Then, placed stedy at foot of bed with 3 attempts to have pt stand on stedy with 2 person assist and pt resistive or only partially clearing sacrum from surface and unable to fully rise. Pt then returned to bed  Ambulation/Gait             General Gait Details: unable   Stairs             Wheelchair Mobility    Modified Rankin (Stroke Patients Only) Modified Rankin (Stroke Patients Only) Pre-Morbid Rankin Score: No symptoms Modified Rankin: Severe disability     Balance Overall balance assessment: Needs assistance Sitting-balance support: Feet supported Sitting balance-Leahy Scale: Poor Sitting balance - Comments: pt with posterior left lean with fatigue. able to maintain sitting in midline with foot egress positioning grossly 30 sec at a time   Standing balance support: Bilateral upper extremity supported Standing balance-Leahy Scale: Poor Standing balance comment: mod assist for balance with flexed posture  Cognition Arousal/Alertness: Awake/alert Behavior During Therapy: Flat affect Overall Cognitive Status: Impaired/Different from baseline Area of Impairment: Orientation;Memory;Safety/judgement;Following commands;Attention                 Orientation Level: Time Current Attention Level: Sustained Memory: Decreased  short-term memory;Decreased recall of precautions Following Commands: Follows one step commands inconsistently;Follows one step commands with increased time Safety/Judgement: Decreased awareness of deficits Awareness: Intellectual Problem Solving: Slow processing;Decreased initiation;Difficulty sequencing;Requires verbal cues General Comments: pt initially with decreased responsiveness but then stating, name and situation appropriately. pt stated incorrect date but it was written incorrectly on board. Pt participating with initial 2 stands, pleasant, joking and smiling. However, with fatigue pt quickly agitated and not following commands      Exercises General Exercises - Lower Extremity Short Arc Quad: AROM;Both;10 reps;Supine Heel Slides: AAROM;10 reps;AROM;Right;Left;Supine(AAROM on LLE, AROm on RLE)    General Comments        Pertinent Vitals/Pain Pain Score: 4  Pain Location: chest with repositioning Pain Descriptors / Indicators: Grimacing Pain Intervention(s): Monitored during session;Repositioned    Home Living                      Prior Function            PT Goals (current goals can now be found in the care plan section) Progress towards PT goals: Progressing toward goals    Frequency    Min 3X/week      PT Plan Current plan remains appropriate    Co-evaluation              AM-PAC PT "6 Clicks" Mobility   Outcome Measure  Help needed turning from your back to your side while in a flat bed without using bedrails?: A Little Help needed moving from lying on your back to sitting on the side of a flat bed without using bedrails?: Total Help needed moving to and from a bed to a chair (including a wheelchair)?: Total Help needed standing up from a chair using your arms (e.g., wheelchair or bedside chair)?: A Lot Help needed to walk in hospital room?: Total Help needed climbing 3-5 steps with a railing? : Total 6 Click Score: 9    End of  Session Equipment Utilized During Treatment: Gait belt Activity Tolerance: Patient limited by lethargy Patient left: in bed;with call bell/phone within reach;with nursing/sitter in room Nurse Communication: Mobility status;Precautions PT Visit Diagnosis: Muscle weakness (generalized) (M62.81);Other abnormalities of gait and mobility (R26.89);Other symptoms and signs involving the nervous system (R29.898)     Time: 9983-3825 PT Time Calculation (min) (ACUTE ONLY): 34 min  Charges:  $Therapeutic Activity: 23-37 mins                     Hyden Soley P, PT Acute Rehabilitation Services Pager: [phone] Office: [place] 04/15/2020, 1:43 PM

## 2020-04-15 NOTE — Progress Notes (Addendum)
IR called to tell this RN that patient would not be having temp HD cath placed today due to an emergency that needed tending to.   OK to resume TF. Hold at MN again.  RN will continue to monitor patient closely.

## 2020-04-15 NOTE — Progress Notes (Signed)
Patient resting comfortably on room air. BIPAP not needed at this time. RT will monitor as needed. 

## 2020-04-15 NOTE — Progress Notes (Signed)
LVAD Coordinator Rounding Note:  HM III LVAD implanted on 03/19/2020 with MAZE procedure + LAA clipping  by Dr Orvan Seen under destination therapy criteria due to advanced age. Primary Heart Failure Cardiologist is Dr. Haroldine Laws.   Pt difficult to arouse this morning; lethargic. Does not interact at all, does not follow commands. Remains anuric. Uremic. Tolerated HD yesterday.   Scheduled for tunneled HD cath today.  Vital signs: Temp: 98.4 HR: 77 - afib Cuff: 103/88 (95) Doppler: 88 O2 Sat: 94% on RA Wt: 253.3>262.3>260.8>259.9>237.8>...>235>233.5>220.2>220.4>219.5>218.2>222.4>229 lbs   LVAD interrogation reveals:  Speed: 5300 Flow: 3.8 Power: 3.6w  PI: 5.6  Alarms: none  Events: none Hematocrit: 23  Fixed speed: 5300 Low speed limit: 5000   Drive Line: CDI. Drive line anchor secure.  Dressing changes twice weekly (Monday / Thursday) dressing changes per nurse champion or VAD coordinator.  Next dressing change due 04/17/20.   Labs:  LDH trend: 360>513>482>...415>422>349>305>279>283>273>259>243>262>271>241  INR trend: 1.3>6.2>9>4.4>6.2>2.2>2.4>2.2>2.0>2.2>2.3>3.4>2.4>1.8>2.8>3.1>2.4>2.3>2.5>2.1  CR: 3.57>3.98>3.18>2.44...>4.3>3.6>2.53>2.25>2.16>1.88>1.83>1.88>2.3>5.52>4.54  Anticoagulation Plan: -INR Goal: 2.0-2.5 -ASA Dose: 81 mg   Respiratory: - extubated 4/9/2 - re-intubated 04/04/2020 s/p left hemothorax with cardiac arrest - extubated 04/06/20  Nitric Oxide: off 03/23/20  Blood Products:  Intraop: 6 FFP  DDAVP  420 cell saver  04/07/2020>>4 FFP 03/21/20>> 1 RBC 03/22/20>>1 RBC  Post-op: 03/31/2020>>3 PC's, 2 FFP 04/14/20>>1 PC's 04/15/20>> 1 PC's  Intra-op: 04/10/2020>>2 PCs      2 FFP  04/05/20: 2 PRBCs  Cultures:  -03/25/20- blood cultures>>negative - 03/28/2020-blood cultures>>negative  Device: -St [name] -Therapies: VF 194 - therapy on  Drips: - Milrinone 0.125 mcg/kg/min >> off 04/09/20 - Levo 1 mcg/min>> off 04/10/20 - Amiodarone 30 mg/hr-off - Tube feed: 50 mL/hr  - holding for procedure today  Adverse Events on VAD: >>03/25/20 CVVH Started >>03/29/2020 left hemothorax; cardiac arrest >>04/14/20 HD  Pt Education:  1. Pt not appropriate for teaching at this time. Does not follow commands. 2. No family at bedside.  Plan/Recommendations:  1. Call VAD coordinator for any equipment or drive line issues.  2. Dressing changes Monday / Thursday per nurse champion or VAD coordinator.  3. VAD coordinator will accompany pt to IR.  Tanda Rockers RN [place] Coordinator  Office: [phone]  24/7 Pager: [phone]

## 2020-04-15 NOTE — Progress Notes (Addendum)
Patient ID: [name] [name], male   DOB: Apr 30, 1951, 69 y.o.   MRN: [id number]    Advanced Heart Failure Rounding Note   Subjective:    - S/p HM3 VAD 4/8 w MAZE procedure + LAA clipping. - Extubated 4/9 - 4/12 LVAD speed increased to 5400 --> echo moderate-severe RV dysfunction, septum mildly left shifted, trivial pericardial effusion.  LVAD speed cut back to 5300.  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  - 4/20 developed hemothorax requiring emergent CT placement and eventual VATS - 4/21 EEG done given concerns for posturing and c/w moderate to severe diffuse encephalopathy. No seizures or epileptiform discharges were seen  - 04/03/20 Pump Stop --> controller change out. CT of head unchanged R MCA CVA - 04/06/20 Extubated - 5/1 CRRT stopped. Had first iHD 5/3.   Remains anuric. Displayed uremic symptoms yesterday, BUN improved slightly w/ HD yesterday, down from 96>>71. K 4.4. Following some commands (sqeezes hand but will not open eyes or answer questions, think this is more of his unwillingness to participate).   CVP 8. Wt up 3 lb.   Co-ox 69%.   LDH 241, INR 2.1  Required 1 unit PRBCs yesterday. Hgb 7.1>>7.4.   Scheduled to get tunneled HD cath today.   VAD Interrogation  Flow 3.6 Speed 5300, Power 3.7  Pulse Index 4.3 No PI events.   VAD interrogated personally. Parameters stable.   Objective:   Weight Range:  Vital Signs:   Temp:  [97.3 F (36.3 C)-98.7 F (37.1 C)] 98 F (36.7 C) (05/04 0704) Pulse Rate:  [57-122] 79 (05/04 0825) Resp:  [12-24] 15 (05/04 0825) BP: (71-129)/(10-112) 103/88 (05/04 0800) SpO2:  [81 %-100 %] 94 % (05/04 0825) Arterial Line BP: (88-135)/(67-82) 88/72 (05/03 1800) Weight:  [102.9 kg-103.9 kg] 103.9 kg (05/04 0530) Last BM Date: 04/14/20  Weight change: Filed Weights   04/14/20 1138 04/14/20 1515 04/15/20 0530  Weight: 103.8 kg 102.9 kg 103.9 kg    Intake/Output:   Intake/Output Summary (Last 24 hours) at  04/15/2020 0856 Last data filed at 04/15/2020 0800 Gross per 24 hour  Intake 1722.69 ml  Output 1009 ml  Net 713.69 ml    PHYSICAL EXAM: CVP 8 General:  Elderly WM, freshly bathed  No respiratory difficulty HEENT: normal + NGT Neck: supple.  JVD 8 cm. + Rt HD cath. Carotids 2+ bilat; no bruits. No lymphadenopathy or thyromegaly appreciated. Cor: + LVAD HUM  Lungs: CTAB Abdomen: soft, nontender, nondistended. No hepatosplenomegaly. No bruits or masses. Good bowel sounds. Drive-line: anchor in place. Clean/dry dressing  Extremities: no cyanosis, clubbing, rash, edema Neuro: awake and moves all 4 extremities w/o difficulty. Follows some commands   Telemetry: AFib 80s  Personally reviewed    Labs: Basic Metabolic Panel: Recent Labs  Lab 04/11/20 0414 04/11/20 1822 04/12/20 0405 04/12/20 0405 04/12/20 1610 04/12/20 1610 04/13/20 0418 04/14/20 0440 04/15/20 0417  NA 137   < > 138  --  136  --  138 136 137  K 4.7   < > 4.6  --  4.9  --  5.2* 6.1* 4.4  CL 102   < > 105  --  102  --  106 101 96*  CO2 25   < > 25  --  24  --  25 25 27   GLUCOSE 120*   < > 102*  --  107*  --  96 109* 71  BUN 37*   < > 36*  --  32*  --  51* 96* 71*  CREATININE 2.30*   < > 2.12*  --  1.89*  --  2.87* 5.52* 4.54*  CALCIUM 8.2*   < > 8.0*   < > 8.0*   < > 7.8* 8.2* 8.1*  MG 2.4  --  2.6*  --   --   --  2.5* 2.9* 2.6*  PHOS 2.5   < > 2.8  --  2.6  --  2.9 4.2 5.2*   < > = values in this interval not displayed.    Liver Function Tests: Recent Labs  Lab 04/12/20 0405 04/12/20 1610 04/13/20 0418 04/14/20 0440 04/15/20 0417  ALBUMIN 2.0* 2.1* 2.0* 2.1* 1.9*   No results for input(s): LIPASE, AMYLASE in the last 168 hours. No results for input(s): AMMONIA in the last 168 hours.  CBC: Recent Labs  Lab 04/12/20 0405 04/13/20 0418 04/14/20 0440 04/14/20 1622 04/15/20 0445  WBC 9.8 12.1* 12.0* 8.7 9.3  HGB 7.7* 7.8* 7.1* 7.5* 7.4*  HCT 24.8* 25.4* 23.5* 23.4* 23.4*  MCV 90.5 92.7 91.4 88.6  91.1  PLT 136* 175 193 196 206    Cardiac Enzymes: No results for input(s): CKTOTAL, CKMB, CKMBINDEX, TROPONINI in the last 168 hours.  BNP: BNP (last 3 results) Recent Labs    03/26/20 2329 04/02/20 2342 04/10/20 0132  BNP 425.7* 332.7* 267.2*    ProBNP (last 3 results) No results for input(s): PROBNP in the last 8760 hours.    Other results:  Imaging: DG Chest Port 1 View  Result Date: 04/15/2020 CLINICAL DATA:  Chest pain EXAM: PORTABLE CHEST 1 VIEW COMPARISON:  April 10, 2020 FINDINGS: There is consolidation in the left base region with small left pleural effusion. There is fibrotic change in the lung bases superimposed. Feeding tube tip is below the diaphragm. There is cardiomegaly. There is a left ventricular assist device. Pacemaker leads are attached to the right atrium and right ventricle. There is a left atrial appendage clamp. The pulmonary vascularity is within normal limits. No adenopathy. There is aortic atherosclerosis. No bone lesions. IMPRESSION: Consolidation left lower lobe concerning for pneumonia. Small left pleural effusion. Underlying bibasilar fibrosis. Cardiomegaly with postoperative changes including left ventricular assist device, pacemaker with leads attached to right atrium and right ventricle, and apparent left atrial appendage clamp. Feeding tube tip below diaphragm. Aortic Atherosclerosis (ICD10-I70.0). Electronically Signed   By: Lowella Grip III M.D.   On: 04/15/2020 08:52     Medications:     Scheduled Medications: . aspirin  81 mg Per Tube Daily  . atorvastatin  80 mg Per Tube q1800  . B-complex with vitamin C  1 tablet Per Tube Daily  . bisacodyl  10 mg Oral Daily   Or  . bisacodyl  10 mg Rectal Daily  . chlorhexidine gluconate (MEDLINE KIT)  15 mL Mouth Rinse BID  . Chlorhexidine Gluconate Cloth  6 each Topical Daily  . Chlorhexidine Gluconate Cloth  6 each Topical Q0600  . darbepoetin (ARANESP) injection - NON-DIALYSIS  100 mcg  Subcutaneous Q Tue-1800  . feeding supplement (PRO-STAT SUGAR FREE 64)  30 mL Per Tube BID  . influenza vaccine adjuvanted  0.5 mL Intramuscular Tomorrow-1000  . insulin aspart  0-24 Units Subcutaneous Q4H  . insulin aspart  6 Units Subcutaneous Q4H  . insulin glargine  24 Units Subcutaneous BID  . mouth rinse  15 mL Mouth Rinse 10 times per day  . midodrine  5 mg Per Tube TID WC  .  pantoprazole (PROTONIX) IV  40 mg Intravenous Daily  . sodium chloride flush  10-40 mL Intracatheter Q12H  . sodium zirconium cyclosilicate  10 g Oral Daily  . Warfarin - Pharmacist Dosing Inpatient   Does not apply q1600    Infusions: . sodium chloride 10 mL/hr at 04/15/20 0800  .  ceFAZolin (ANCEF) IV    . feeding supplement (NEPRO CARB STEADY) Stopped (04/15/20 0001)  . norepinephrine (LEVOPHED) Adult infusion Stopped (04/11/20 2215)    PRN Medications: sodium chloride, acetaminophen, dextrose, hydrALAZINE, levalbuterol, ondansetron (ZOFRAN) IV, oxyCODONE, sodium chloride flush, sorbitol, traMADol   Assessment/Plan:   1. Acute on chronic systolic HF -> cardiogenic shock-> S/p HM3 LVAD - Echo 2016 EF 30-35% - Echo 1/21 EF 25% - Admitted with NYHA IV symptoms and AKI with attempts at diuresis. Initial co-ox 39% - Echo this admit: EF 10-15% moderate RV dysfunction - R/LHC cath 3/21 with severe 3v CAD and low output with CI 1.7.  - Initially planned for CABG but given need for re-do sternotomy, relatively poor targets and longstanding low EF, VAD felt to be better option  - S/p HM3 VAD 4/8 - Speed turned up on 4/11 to  5400, but looking at 4/10 echo there is significant RV dysfunction with leftward septal shift so speed decreased back to 5300 rpm.  - Off milrinone. Off NE. On Midodrine 5 tid. MAP 70s. CO-ox 68% - Now off CVVH. First iHD on 5/3.  - Volume status ok. CVP 8. Continue HD per nephrology   2. VAD - VAD interrogated personally. Parameters stable. - Had Pump Stop 04/03/20. Controller  changed out with old controller sent back to [facility] for evaluation.  - on aspirin 81 mg daily + coumadin.  - LDH 241 - INR 2.1  Will lower INR goal to 1.8-2.3 given anemia. ? GIB. Discussed dosing with PharmD personally. - Driveline ok   3. CAD with unstable angina - s/p previous PCI. - cath 02/11/2020 with severe 3v CAD - now s/p VAD on 03/16/2020   - No chest pain.   4. AKI on CKD 3a - Nephrology consulted. Started CVVHD 4/13.    - Renal US 4/5 unremarkable.  - Hopefully renal function will recover but Renal team not optimistic - He is now off NE w/ stable MAP.  - Off CVVH.  1st iHD 5/3.  - continue HD per nephrology. Monitor for uremic symptoms  - plan tunnel HD cath today   5. Permanent AF - now s/p MAZE + LAA Clipping 4/8   - Rate controlled - On coumadin. INR 2.1  6. MVP s/p MV repair - On admit with recurrent severe MR on echo and with huge v-waves on PCWP tracing - s/p VAD  7. Hemothoax - s/p CT placement and VATs on 4/22 - CTs pulled 4/26 - stable  8. ID/leukocytosis - 4/11 procalcitonin 1.96  - Started empiric coverage for PNA with vancomycin/cefepime for 7 days. Stopped 4/17 - Given recent VATs and high-dose pressor requirements he was treated with broad spectrum abx, linezolid and cefepime (completed course 4/28)  - Remains afebrile  9. Acute Hypoxic Respiratory Failure - Re-intubated 4/20 with hemothorax- - Extubated 4/25 - Remains stable on [place]  10. Malnutrition  - Albumin 2.6  - On tube feeds.  - Speech following, still NPO with ice chips, needs to be re-evaluated    11. CVA/anoxic brain injury - 03/27/20 CT large posterior MCA ischemic infarct, possibly from atrial fibrillation.  INR was supratherapeutic when CVA found.  Stable LDH, do not think partial pump thrombosis is the culprit. - Still with some L weakness and inattention but improving  - PT/OT/speech following.  - CIR consulted.   - Continue to mobilize, very weak.   12. Anemia, post-op  -  Received 2u RBC 04/05/20 + 1 Unit on 5/3 - Hgb 7.4 today - Transfuse if hemoglobin < 7.5  - Plan transfusion x 1 unit PRBCs again today, if response not appropriate will check FOBT to assess for GIB - lower INR goal to 1.8-2.3  13. Hyperkalemia - K 4.4 today - Continue iHD  - can use [place] PRN on non HD days if needed   14. DM2, poorly controlled - hgbA1c 11.1% - continue insulin   Length of Stay: [address], PA-C  8:56 AM  04/15/2020   Patient seen and examined with the above-signed Advanced Practice Provider and/or Housestaff. I personally reviewed laboratory data, imaging studies and relevant notes. I independently examined the patient and formulated the important aspects of the plan. I have edited the note to reflect any of my changes or salient points. I have personally discussed the plan with the patient and/or family.  Initially not responding to me this am but then I held his nose and he got irritated with me and then began answering my questions, Was able to tell me th year and the month as well as where he was at and why he was here. Also able to move all 4 though he was week.   Now off pressors. Remains anuric. Tolerated iHD yesterday. Getting tunneled cath for CVVHD today. Co-ox 69%. VAD parameters look good,   General: lying in bed NAD HEENT: normal  Neck: supple. JVP not elevated.  Carotids 2+ bilat; no bruits. No lymphadenopathy or thryomegaly appreciated. Cor: LVAD hum. R [place] trialysos Lungs: Clear. Decreased left base Abdomen: obese soft, nontender, non-distended. No hepatosplenomegaly. No bruits or masses. Good bowel sounds. Driveline site clean. Anchor in place.  Extremities: no cyanosis, clubbing, rash. Warm no edema  Neuro: alert & oriented x 3 after prodding No focal deficits. Moves all 4 but is weak  He remains weak but is much more clear after HD. I told him that he needs to be more interactive with provider team so we can proceed with rehab and  try to get him stronger.  Unfortunately remains anuric and getting tunneled catheter placed later today .Getting 1u RBCs today. VAD interrogated personally. Parameters stable. .INR 2.1. Discussed dosing with PharmD personally.  Glori Bickers, MD  7:31 PM

## 2020-04-15 NOTE — Progress Notes (Signed)
Patient talking in complete sentences this evening requesting water and ice. He asked about his children, if they had called and said he hadn't seen them the whole time he had been in the hospital. He knew he had a heart pump and new the RPMs could go up to 5000 rpm. He knew he was on blood thinners and he said he noticed his bruised arms. He complained of hip, groin, and thigh pain. He said he felt weak. He requested a warm blanket if he should fall asleep.  He swallowed ice water and apple sauce. He coughed once and shows a good cough reflex. However, did well with intake overall.   RN will continue to monitor closely.

## 2020-04-15 NOTE — Progress Notes (Deleted)
Entered in error

## 2020-04-16 ENCOUNTER — Inpatient Hospital Stay (HOSPITAL_COMMUNITY): Payer: Medicare HMO

## 2020-04-16 DIAGNOSIS — R570 Cardiogenic shock: Secondary | ICD-10-CM | POA: Diagnosis not present

## 2020-04-16 HISTORY — PX: IR FLUORO GUIDE CV LINE RIGHT: IMG2283

## 2020-04-16 HISTORY — PX: IR US GUIDE VASC ACCESS RIGHT: IMG2390

## 2020-04-16 LAB — BPAM RBC
Blood Product Expiration Date: 202105312359
Blood Product Expiration Date: 202106032359
ISSUE DATE / TIME: 202105031120
ISSUE DATE / TIME: 202105041052
Unit Type and Rh: 5100
Unit Type and Rh: 5100

## 2020-04-16 LAB — TYPE AND SCREEN
ABO/RH(D): O POS
Antibody Screen: NEGATIVE
Unit division: 0
Unit division: 0

## 2020-04-16 LAB — CBC
HCT: 26 % — ABNORMAL LOW (ref 39.0–52.0)
Hemoglobin: 8.3 g/dL — ABNORMAL LOW (ref 13.0–17.0)
MCH: 28.6 pg (ref 26.0–34.0)
MCHC: 31.9 g/dL (ref 30.0–36.0)
MCV: 89.7 fL (ref 80.0–100.0)
Platelets: 244 10*3/uL (ref 150–400)
RBC: 2.9 MIL/uL — ABNORMAL LOW (ref 4.22–5.81)
RDW: 15.8 % — ABNORMAL HIGH (ref 11.5–15.5)
WBC: 9 10*3/uL (ref 4.0–10.5)
nRBC: 0 % (ref 0.0–0.2)

## 2020-04-16 LAB — RENAL FUNCTION PANEL
Albumin: 2 g/dL — ABNORMAL LOW (ref 3.5–5.0)
Anion gap: 13 (ref 5–15)
BUN: 97 mg/dL — ABNORMAL HIGH (ref 8–23)
CO2: 25 mmol/L (ref 22–32)
Calcium: 8.2 mg/dL — ABNORMAL LOW (ref 8.9–10.3)
Chloride: 96 mmol/L — ABNORMAL LOW (ref 98–111)
Creatinine, Ser: 6.32 mg/dL — ABNORMAL HIGH (ref 0.61–1.24)
GFR calc Af Amer: 9 mL/min — ABNORMAL LOW (ref >60)
GFR calc non Af Amer: 8 mL/min — ABNORMAL LOW (ref >60)
Glucose, Bld: 76 mg/dL (ref 70–99)
Phosphorus: 6.9 mg/dL — ABNORMAL HIGH (ref 2.5–4.6)
Potassium: 5 mmol/L (ref 3.5–5.1)
Sodium: 134 mmol/L — ABNORMAL LOW (ref 135–145)

## 2020-04-16 LAB — PROTIME-INR
INR: 2.1 — ABNORMAL HIGH (ref 0.8–1.2)
Prothrombin Time: 23 seconds — ABNORMAL HIGH (ref 11.4–15.2)

## 2020-04-16 LAB — COOXEMETRY PANEL
Carboxyhemoglobin: 2.2 % — ABNORMAL HIGH (ref 0.5–1.5)
Methemoglobin: 0.6 % (ref 0.0–1.5)
O2 Saturation: 68.9 %
Total hemoglobin: 8.3 g/dL — ABNORMAL LOW (ref 12.0–16.0)

## 2020-04-16 LAB — GLUCOSE, CAPILLARY
Glucose-Capillary: 79 mg/dL (ref 70–99)
Glucose-Capillary: 93 mg/dL (ref 70–99)
Glucose-Capillary: 96 mg/dL (ref 70–99)
Glucose-Capillary: 112 mg/dL — ABNORMAL HIGH (ref 70–99)
Glucose-Capillary: 183 mg/dL — ABNORMAL HIGH (ref 70–99)

## 2020-04-16 LAB — LACTATE DEHYDROGENASE: LDH: 253 U/L — ABNORMAL HIGH (ref 98–192)

## 2020-04-16 LAB — HEPATITIS B SURFACE ANTIGEN: Hepatitis B Surface Ag: NONREACTIVE

## 2020-04-16 LAB — MAGNESIUM: Magnesium: 2.7 mg/dL — ABNORMAL HIGH (ref 1.7–2.4)

## 2020-04-16 MED ORDER — WARFARIN 0.5 MG HALF TABLET
0.5000 mg | ORAL_TABLET | Freq: Once | ORAL | Status: AC
  Administered 2020-04-16: 0.5 mg via ORAL
  Filled 2020-04-16 (×2): qty 1

## 2020-04-16 MED ORDER — ALBUMIN HUMAN 25 % IV SOLN: 25.0000 g | Freq: Once | INTRAVENOUS | Status: AC

## 2020-04-16 MED ORDER — LIDOCAINE-EPINEPHRINE 1 %-1:100000 IJ SOLN
INTRAMUSCULAR | Status: AC
  Filled 2020-04-16: qty 1

## 2020-04-16 MED ORDER — CITALOPRAM HYDROBROMIDE 20 MG PO TABS
10.0000 mg | ORAL_TABLET | Freq: Every day | ORAL | Status: DC
  Administered 2020-04-16 – 2020-05-02 (×16): 10 mg
  Filled 2020-04-16 (×16): qty 1

## 2020-04-16 MED ORDER — MIDAZOLAM HCL 2 MG/2ML IJ SOLN
INTRAMUSCULAR | Status: AC
  Filled 2020-04-16: qty 2

## 2020-04-16 MED ORDER — HEPARIN SODIUM (PORCINE) 1000 UNIT/ML IJ SOLN
INTRAMUSCULAR | Status: AC
  Administered 2020-04-16: 3.2 mL
  Filled 2020-04-16: qty 1

## 2020-04-16 MED ORDER — FENTANYL CITRATE (PF) 100 MCG/2ML IJ SOLN
INTRAMUSCULAR | Status: AC | PRN
  Administered 2020-04-16: 25 ug via INTRAVENOUS

## 2020-04-16 MED ORDER — FENTANYL CITRATE (PF) 100 MCG/2ML IJ SOLN
INTRAMUSCULAR | Status: AC
  Filled 2020-04-16: qty 2

## 2020-04-16 MED ORDER — HEPARIN SODIUM (PORCINE) 1000 UNIT/ML IJ SOLN
2800.0000 [IU] | Freq: Once | INTRAMUSCULAR | Status: AC
  Administered 2020-04-16: 2800 [IU] via INTRAVENOUS

## 2020-04-16 MED ORDER — MIDAZOLAM HCL 2 MG/2ML IJ SOLN
INTRAMUSCULAR | Status: AC | PRN
  Administered 2020-04-16: 0.5 mg via INTRAVENOUS

## 2020-04-16 MED ORDER — LIDOCAINE HCL (PF) 1 % IJ SOLN
INTRAMUSCULAR | Status: AC | PRN
  Administered 2020-04-16: 10 mL via SUBCUTANEOUS

## 2020-04-16 MED ORDER — ALBUMIN HUMAN 25 % IV SOLN
INTRAVENOUS | Status: AC
  Administered 2020-04-16: 25 g via INTRAVENOUS
  Filled 2020-04-16: qty 200

## 2020-04-16 NOTE — Progress Notes (Signed)
ANTICOAGULATION CONSULT NOTE  Pharmacy Consult for warfarin Indication: post op LVAD  / Aflutter  Allergies  Allergen Reactions  . Liraglutide Nausea And Vomiting  . Lisinopril Cough    Patient Measurements: Height: 6' (182.9 cm) Weight: 95.3 kg (210 lb 1.6 oz) IBW/kg (Calculated) : 77.6 Heparin Dosing Weight: 101.2 kg  Vital Signs: Temp: 97.9 F (36.6 C) (05/05 0745) Temp Source: Oral (05/05 0745) BP: 106/79 (05/05 0805) Pulse Rate: 67 (05/05 0815)  Labs: Recent Labs    04/14/20 0440 04/14/20 1622 04/15/20 0417 04/15/20 0445 04/15/20 0445 04/15/20 1700 04/16/20 0450  HGB 7.1*   < >  --  7.4*   < > 8.4* 8.3*  HCT 23.5*   < >  --  23.4*  --  26.1* 26.0*  PLT 193   < >  --  206  --  226 244  LABPROT 25.9*  --  22.4*  --   --   --  23.0*  INR 2.5*  --  2.1*  --   --   --  2.1*  CREATININE 5.52*  --  4.54*  --   --   --  6.32*   < > = values in this interval not displayed.    Estimated Creatinine Clearance: 13 mL/min (A) (by C-G formula based on SCr of 6.32 mg/dL (H)).   Medical History: Past Medical History:  Diagnosis Date  . Abdominal pain 01/05/2020  . Abnormal nuclear stress test 01/14/2015  . Acute on chronic combined systolic and diastolic CHF (congestive heart failure) ([place])   . Acute respiratory failure with hypoxia ([place])   . AKI (acute kidney injury) ([place])   . Anxiety   . Arthritis   . Atrial fibrillation ([place]) 11/29/2016  . Atrial fibrillation with RVR ([place])   . Atrial fibrillation, chronic ([place]) 11/29/2016   Last Assessment & Plan:  Formatting of this note might be different from the original. -continue present medications with warfarin anti-coagulation Last Assessment & Plan:  Formatting of this note might be different from the original. -chronic -anti-coagulated -rate controlled  . Automatic implantable cardioverter-defibrillator in situ 08/18/2011   Formatting of this note might be different from the original. Last Assessment & Plan:  Now followed in  ICD clinic. He has followup in January.  Last Assessment & Plan:  Formatting of this note might be different from the original. -historical note  . Benign prostatic hyperplasia   . Bipolar I disorder with depression ([place]) 01/06/2011   Last Assessment & Plan:  -historical note in chart -continue home medications  Last Assessment & Plan:  Formatting of this note might be different from the original. -historical note in chart -continue home medications  . CAD (coronary artery disease)    a.  1993 s/p MI - [employer];  b. s/p BMS to LAD '00;  c. PTCA 2nd diagonal 2010;  d. 02/18/12 Cath: moderate nonobs dzs - med rx;  e.  01/2015 Cath: LM nl, LAD 40-72m ISR, 70-24m/d, d1 90p (3.0x16 Synergy DES), D2 50-60, LCX nl, OM1 50p, 66m (2.5x12 Synergy DES), RCA nl, EF 30-35%.  . Cardiogenic shock ([place]) 03/21/2020  . Chronic anticoagulation 09/22/2018   Last Assessment & Plan:  -continue warfarin for atrial fibrillation with pharmacy to manage  Last Assessment & Plan:  Formatting of this note might be different from the original. -continue warfarin for atrial fibrillation with pharmacy to manage  . Chronic combined systolic and diastolic CHF (congestive heart failure) ([place])    a. 12/2014  Echo: EF 30-35%, Gr2 DD, mod MR, sev dil LA.  Marland Kitchen Chronic congestive heart failure ([place]) 04/22/2015   Last Assessment & Plan:  Formatting of this note might be different from the original. -known EF 20-25% -continue medical management -appears stable clinically  Last Assessment & Plan:  Formatting of this note might be different from the original. -noted with abnormal EF and chronic activity restrictions -medically manage  . Chronic systolic dysfunction of left ventricle 07/25/2013  . CKD (chronic kidney disease) 09/22/2018  . CKD (chronic kidney disease), stage III   . Coagulopathy ([place])   . COVID-19 04/17/2019   Last Assessment & Plan:  -Apparent scattered infiltrates on CT chest.  -SARS Co V positive -afebrile and not hypoxic at  rest; does qualify for home o2 with sats 88% on room air with activity -home today on self-quarantine x 14 days -finish azithromycin at home -ask Home Health to check on patient Last Assessment & Plan:  -has persistently positive test since initial verification 04/16/19 -contract/d  . Depression   . Diabetic retinopathy associated with type 2 diabetes mellitus ([place]) 01/30/2020  . Diarrhea 01/05/2020  . Dizziness 09/27/2016  . DM (diabetes mellitus), type 2 ([place]) 03/19/2013   Last Assessment & Plan:  Formatting of this note might be different from the original. -hold metformin -reduce insulin with accuchecks and SSI -diet consult -HbA1c  . DNR (do not resuscitate) discussion 03/18/2013  . ED (erectile dysfunction)   . Elevated troponin 09/22/2018   Last Assessment & Plan:  Formatting of this note might be different from the original. -trend -mild elevation not thought to be ischemic on admission from history  . GERD (gastroesophageal reflux disease)   . Hyperlipidemia   . Hypertension   . ICD (implantable cardiac defibrillator) battery depletion 03/18/2013  . IHD (ischemic heart disease) 01/08/2019   Formatting of this note might be different from the original. Last Assessment & Plan:  Minimal symptoms of angina. He is on Ranexa, isosorbide, and a beta blocker. We'll try to maximize his blood pressure therapy. I stressed the importance of regular daily aerobic exercise. He needs to follow a heart healthy/diabetic diet.  . Ischemic cardiomyopathy    a. s/p [employer] [name] (Atlas) ICD implanted in [place] 2007;  b. 12/2014 Echo: Ef 30-35%.  . Ischemic dilated cardiomyopathy ([place]) 03/31/2020  . LVAD (left ventricular assist device) present ([place]) 03/21/2020  . MVP (mitral valve prolapse)    a. s/p MV annuloplasty at [facility] 2004.  . Non-rheumatic mitral regurgitation 01/08/2019   Formatting of this note might be different from the original. Last Assessment & Plan:  He does have a murmur of mitral insufficiency by  exam. We will followup an echocardiogram to assess the success of his mitral valve repair.  . Other chest pain 06/01/2019   Last Assessment & Plan:  Formatting of this note might be different from the original. -known COVID19 with known CAD and cardiomyopathy -trend troponins -consider other etiologies like cardiac involvement with COVID19 -consult DR Gavin Potters -consider echo (although none available until 6/22) -await CT thorax result  . Palliative care by specialist   . Persistent atrial fibrillation ([place])    a. noted on ICD interrogation '10 - not previously on [place] - CHA2DS2VASc = 5.  . Pneumonia, unspecified organism 09/22/2018  . S/P coronary artery stent placement   . Severe episode of recurrent major depressive disorder, without psychotic features ([place]) 02/07/2020  . Shortness of breath   . Syncope 09/15/2012  . Tick  bite 06/02/2019   Last Assessment & Plan:  -reported by ER staff; had tick on umbilicus (removed) -monitor for any signs requiring treatment (I.e. rash, fever)  Last Assessment & Plan:  Formatting of this note might be different from the original. -reported by ER staff; had tick on umbilicus (removed) -monitor for any signs requiring treatment (I.e. rash, fever)  . TSH elevation   . Type II diabetes mellitus ([facility])    uncontrolled  . Unstable angina ([place]) 02/19/2012    Medications:  Medications Prior to Admission  Medication Sig Dispense Refill Last Dose  . ALPRAZolam (XANAX) 0.25 MG tablet Take 0.25 mg by mouth 3 (three) times daily as needed for anxiety.    unknown  . amiodarone (PACERONE) 200 MG tablet Take 200 mg by mouth daily.    03/11/2020 at Unknown time  . apixaban (ELIQUIS) 5 MG TABS tablet Take 1 tablet (5 mg total) by mouth 2 (two) times daily. 60 tablet 1 03/11/2020 at 0600  . atorvastatin (LIPITOR) 40 MG tablet Take 1 tablet (40 mg total) by mouth daily at 6 PM. 30 tablet 0 03/10/2020 at Unknown time  . citalopram (CELEXA) 20 MG tablet Take 20 mg by mouth daily.     03/11/2020 at Unknown time  . furosemide (LASIX) 40 MG tablet Take 40 mg by mouth daily.    03/11/2020 at Unknown time  . insulin aspart (NOVOLOG FLEXPEN) 100 UNIT/ML FlexPen Inject 6 Units into the skin 3 (three) times daily with meals. 15 mL 0 03/11/2020 at Unknown time  . Insulin Glargine (BASAGLAR KWIKPEN) 100 UNIT/ML SOPN Inject 0.5 mLs (50 Units total) into the skin daily. 15 mL 1 03/11/2020 at Unknown time  . metoprolol succinate (TOPROL-XL) 100 MG 24 hr tablet Take 1 tablet (100 mg total) by mouth daily. 90 tablet 3 03/11/2020 at 0600  . nitroGLYCERIN (NITROSTAT) 0.4 MG SL tablet Place 1 tablet (0.4 mg total) under the tongue every 5 (five) minutes x 3 doses as needed for chest pain. 10 tablet 0 unknown  . pantoprazole (PROTONIX) 40 MG tablet Take 1 tablet (40 mg total) by mouth daily. 30 tablet 1 03/11/2020 at Unknown time  . potassium chloride SA (KLOR-CON) 20 MEQ tablet Take 20 mEq by mouth daily.    03/11/2020 at Unknown time  . tamsulosin (FLOMAX) 0.4 MG CAPS capsule Take 1 capsule (0.4 mg total) by mouth daily. 30 capsule 0 03/11/2020 at Unknown time  . ALPRAZolam (XANAX) 1 MG tablet Take 1 tablet (1 mg total) by mouth 2 (two) times daily as needed for anxiety. (Patient not taking: Reported on 03/07/2020) 10 tablet 0 Not Taking at Unknown time    Assessment: 70 YOM with atrial fibrillation on Eliquis PTA s/p R & L heart cath found to have 3v disease s/p  LVAD  Implant 4/8. Warfarin initiated on 4/9 after discussing with MD.  CT on 4/15 now found to have large right MCA stroke.   Warfarin resumed cautiously on 4/17. Held doses since 4/20 since he went to the OR for VATS. Warfarin was cautiously resumed on 4/23.  INR stable 2.1, at goal. Hgb 8.3, Pltc with normal limits. LDH 250s. No bleeding has been noted. INR goal reduced 5/4 per HF team.    HD cath postponed yesterday to today.   Goal of Therapy:  INR 1.8-2.3 Monitor platelets by anticoagulation protocol: Yes   Plan:  Warfarin  0.5mg  tonight Daily INR  Erin Hearing PharmD., BCPS Clinical Pharmacist 04/16/2020 8:16 AM

## 2020-04-16 NOTE — Progress Notes (Addendum)
Patient ID: [name] [name], male   DOB: 09-Jan-1951, 69 y.o.   MRN: [id number]    Advanced Heart Failure Rounding Note   Subjective:    - S/p HM3 VAD 4/8 w MAZE procedure + LAA clipping. - Extubated 4/9 - 4/12 LVAD speed increased to 5400 --> echo moderate-severe RV dysfunction, septum mildly left shifted, trivial pericardial effusion.  LVAD speed cut back to 5300.  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  - 4/20 developed hemothorax requiring emergent CT placement and eventual VATS - 4/21 EEG done given concerns for posturing and c/w moderate to severe diffuse encephalopathy. No seizures or epileptiform discharges were seen  - 04/03/20 Pump Stop --> controller change out. CT of head unchanged R MCA CVA - 04/06/20 Extubated - 5/1 CRRT stopped. Had first iHD 5/3.   Remains anuric. BUN 97 today. Getting HD at bedside. Unfortunately, he was unable to get tunneled HD cath yesterday. Procedure canceled by IR due to another emergency.    CVP 4-5  Co-ox 69%.   LDH 253, INR 2.1 (new INR goal 1.8-2.3)  Required 1 unit PRBCs yesterday. Hgb 7.4>>8.3.   Per RN notes, he got extremely agitated last night. Yelling at staff. Attempted to take off monitoring wires. Made comment that he wanted to rip chest open and get rid of device.   Sleeping today. Not very interactive.   VAD Interrogation  Flow 4.2 Speed 5300, Power 3.6  Pulse Index 4.0 No PI events.   VAD interrogated personally. Parameters stable.   Objective:   Weight Range:  Vital Signs:   Temp:  [97.8 F (36.6 C)-98.5 F (36.9 C)] 97.9 F (36.6 C) (05/05 0745) Pulse Rate:  [42-95] 63 (05/05 0745) Resp:  [10-23] 12 (05/05 0745) BP: (77-133)/(65-120) 101/75 (05/05 0700) SpO2:  [90 %-99 %] 94 % (05/05 0745) Weight:  [95.3 kg] 95.3 kg (05/05 0500) Last BM Date: 04/15/20  Weight change: Filed Weights   04/14/20 1515 04/15/20 0530 04/16/20 0500  Weight: 102.9 kg 103.9 kg 95.3 kg     Intake/Output:   Intake/Output Summary (Last 24 hours) at 04/16/2020 2831 Last data filed at 04/16/2020 0700 Gross per 24 hour  Intake 1514.98 ml  Output 300 ml  Net 1214.98 ml    PHYSICAL EXAM: CVP 4-5 General:  Elderly male, resting in bed and getting HD, sleeping, not very interactive but no respiratory difficulty HEENT: normal + NGT Neck: supple. No JVD + Rt HD cath. Carotids 2+ bilat; no bruits. No lymphadenopathy or thyromegaly appreciated. Cor: + LVAD HUM  Lungs: CTAB Abdomen: soft, nontender, nondistended. No hepatosplenomegaly. No bruits or masses. Good bowel sounds. Drive-line: anchor in place. Clean/dry dressing  Extremities: no cyanosis, clubbing, rash, edema Neuro: awake and moves all 4 extremities w/o difficulty. Follows some commands   Telemetry: AFib 80s  Personally reviewed    Labs: Basic Metabolic Panel: Recent Labs  Lab 04/12/20 0405 04/12/20 0405 04/12/20 1610 04/12/20 1610 04/13/20 0418 04/13/20 0418 04/14/20 0440 04/15/20 0417 04/16/20 0450  NA 138   < > 136  --  138  --  136 137 134*  K 4.6   < > 4.9  --  5.2*  --  6.1* 4.4 5.0  CL 105   < > 102  --  106  --  101 96* 96*  CO2 25   < > 24  --  25  --  25 27 25   GLUCOSE 102*   < > 107*  --  96  --  109* 71 76  BUN 36*   < > 32*  --  51*  --  96* 71* 97*  CREATININE 2.12*   < > 1.89*  --  2.87*  --  5.52* 4.54* 6.32*  CALCIUM 8.0*   < > 8.0*   < > 7.8*   < > 8.2* 8.1* 8.2*  MG 2.6*  --   --   --  2.5*  --  2.9* 2.6* 2.7*  PHOS 2.8   < > 2.6  --  2.9  --  4.2 5.2* 6.9*   < > = values in this interval not displayed.    Liver Function Tests: Recent Labs  Lab 04/12/20 1610 04/13/20 0418 04/14/20 0440 04/15/20 0417 04/16/20 0450  ALBUMIN 2.1* 2.0* 2.1* 1.9* 2.0*   No results for input(s): LIPASE, AMYLASE in the last 168 hours. No results for input(s): AMMONIA in the last 168 hours.  CBC: Recent Labs  Lab 04/14/20 0440 04/14/20 1622 04/15/20 0445 04/15/20 1700 04/16/20 0450  WBC  12.0* 8.7 9.3 8.4 9.0  HGB 7.1* 7.5* 7.4* 8.4* 8.3*  HCT 23.5* 23.4* 23.4* 26.1* 26.0*  MCV 91.4 88.6 91.1 89.1 89.7  PLT 193 196 206 226 244    Cardiac Enzymes: No results for input(s): CKTOTAL, CKMB, CKMBINDEX, TROPONINI in the last 168 hours.  BNP: BNP (last 3 results) Recent Labs    03/26/20 2329 04/02/20 2342 04/10/20 0132  BNP 425.7* 332.7* 267.2*    ProBNP (last 3 results) No results for input(s): PROBNP in the last 8760 hours.    Other results:  Imaging: DG Chest Port 1 View  Result Date: 04/15/2020 CLINICAL DATA:  Chest pain EXAM: PORTABLE CHEST 1 VIEW COMPARISON:  April 10, 2020 FINDINGS: There is consolidation in the left base region with small left pleural effusion. There is fibrotic change in the lung bases superimposed. Feeding tube tip is below the diaphragm. There is cardiomegaly. There is a left ventricular assist device. Pacemaker leads are attached to the right atrium and right ventricle. There is a left atrial appendage clamp. The pulmonary vascularity is within normal limits. No adenopathy. There is aortic atherosclerosis. No bone lesions. IMPRESSION: Consolidation left lower lobe concerning for pneumonia. Small left pleural effusion. Underlying bibasilar fibrosis. Cardiomegaly with postoperative changes including left ventricular assist device, pacemaker with leads attached to right atrium and right ventricle, and apparent left atrial appendage clamp. Feeding tube tip below diaphragm. Aortic Atherosclerosis (ICD10-I70.0). Electronically Signed   By: Lowella Grip III M.D.   On: 04/15/2020 08:52     Medications:     Scheduled Medications: . aspirin  81 mg Per Tube Daily  . atorvastatin  80 mg Per Tube q1800  . B-complex with vitamin C  1 tablet Per Tube Daily  . bisacodyl  10 mg Oral Daily   Or  . bisacodyl  10 mg Rectal Daily  . chlorhexidine gluconate (MEDLINE KIT)  15 mL Mouth Rinse BID  . Chlorhexidine Gluconate Cloth  6 each Topical Daily  .  Chlorhexidine Gluconate Cloth  6 each Topical Q0600  . darbepoetin (ARANESP) injection - NON-DIALYSIS  100 mcg Subcutaneous Q Tue-1800  . feeding supplement (PRO-STAT SUGAR FREE 64)  30 mL Per Tube BID  . influenza vaccine adjuvanted  0.5 mL Intramuscular Tomorrow-1000  . insulin aspart  0-24 Units Subcutaneous Q4H  . insulin aspart  6 Units Subcutaneous Q4H  . insulin glargine  24 Units Subcutaneous BID  . mouth rinse  15  mL Mouth Rinse 10 times per day  . midodrine  5 mg Per Tube TID WC  . pantoprazole (PROTONIX) IV  40 mg Intravenous Daily  . sodium chloride flush  10-40 mL Intracatheter Q12H  . sodium zirconium cyclosilicate  10 g Oral Daily  . Warfarin - Pharmacist Dosing Inpatient   Does not apply q1600    Infusions: . sodium chloride Stopped (04/15/20 1731)  . albumin human    .  ceFAZolin (ANCEF) IV    . feeding supplement (NEPRO CARB STEADY) Stopped (04/16/20 0100)  . norepinephrine (LEVOPHED) Adult infusion Stopped (04/11/20 2215)    PRN Medications: sodium chloride, acetaminophen, dextrose, hydrALAZINE, levalbuterol, ondansetron (ZOFRAN) IV, oxyCODONE, sodium chloride flush, sorbitol, traMADol   Assessment/Plan:   1. Acute on chronic systolic HF -> cardiogenic shock-> S/p HM3 LVAD - Echo 2016 EF 30-35% - Echo 1/21 EF 25% - Admitted with NYHA IV symptoms and AKI with attempts at diuresis. Initial co-ox 39% - Echo this admit: EF 10-15% moderate RV dysfunction - R/LHC cath 3/21 with severe 3v CAD and low output with CI 1.7.  - Initially planned for CABG but given need for re-do sternotomy, relatively poor targets and longstanding low EF, VAD felt to be better option  - S/p HM3 VAD 4/8 - Speed turned up on 4/11 to  5400, but looking at 4/10 echo there is significant RV dysfunction with leftward septal shift so speed decreased back to 5300 rpm.  - Off milrinone. Off NE. On Midodrine 5 tid. MAP 70s. CO-ox 69% - Now off CVVH. Now on iHD - Volume status ok. CVP 4-5.  Continue HD per nephrology   2. VAD - VAD interrogated personally. Parameters stable. - Had Pump Stop 04/03/20. Controller changed out with old controller sent back to [facility] for evaluation.  - on aspirin 81 mg daily + coumadin.  - LDH 253 - INR 2.1  Will lower INR goal to 1.8-2.3 given anemia. Discussed dosing with PharmD personally. - Driveline ok   3. CAD with unstable angina - s/p previous PCI. - cath 03/08/2020 with severe 3v CAD - now s/p VAD on 03/19/2020   - No chest pain.   4. AKI on CKD 3a - Nephrology consulted. Started CVVHD 4/13.    - Renal US 4/5 unremarkable.  - Hopefully renal function will recover but Renal team not optimistic - He is now off NE w/ stable MAP.  - Off CVVH.  1st iHD 5/3.  - continue HD per nephrology. Monitor for uremic symptoms  - plan tunnel HD cath today   5. Permanent AF - now s/p MAZE + LAA Clipping 4/8   - Rate controlled - On coumadin. INR 2.1  6. MVP s/p MV repair - On admit with recurrent severe MR on echo and with huge v-waves on PCWP tracing - s/p VAD  7. Hemothoax - s/p CT placement and VATs on 4/22 - CTs pulled 4/26 - stable  8. ID/leukocytosis - 4/11 procalcitonin 1.96  - Started empiric coverage for PNA with vancomycin/cefepime for 7 days. Stopped 4/17 - Given recent VATs and high-dose pressor requirements he was treated with broad spectrum abx, linezolid and cefepime (completed course 4/28)  - Remains afebrile  9. Acute Hypoxic Respiratory Failure - Re-intubated 4/20 with hemothorax- - Extubated 4/25 - Remains stable on [place]  10. Malnutrition  - Albumin 2.6  - On tube feeds.  - Speech following, still NPO with ice chips, needs to be re-evaluated    11. CVA/anoxic brain injury -  03/27/20 CT large posterior MCA ischemic infarct, possibly from atrial fibrillation.  INR was supratherapeutic when CVA found.  Stable LDH, do not think partial pump thrombosis is the culprit. - Still with some L weakness and inattention but  improving  - PT/OT/speech following.  - CIR consulted.   - Continue to mobilize, very weak.   12. Anemia, post-op  - Received 2u RBC 04/05/20 + 1 Unit on 5/3 + 1 unit 5/4 - Hgb 8.3 today - Transfuse if hemoglobin < 7.5  - lower INR goal to 1.8-2.3  13. Hyperkalemia - K 5.0  today - Continue iHD  - can use Lokelma PRN on non HD days if needed   14. DM2, poorly controlled - hgbA1c 11.1% - continue insulin   15. Agitation/ Depression  - Add Celexa 10 mg daily  - Monitor   16. Deconditioning - try to mobilize more w/ PT/OT   Length of Stay: [address] [place]  8:08 AM  04/16/2020   Patient seen and examined with the above-signed Advanced Practice Provider and/or Housestaff. I personally reviewed laboratory data, imaging studies and relevant notes. I independently examined the patient and formulated the important aspects of the plan. I have edited the note to reflect any of my changes or salient points. I have personally discussed the plan with the patient and/or family.  Clearer this am after HD. Continues to be sleepy in the morning and not very responsive but if I prompt him more he will respond. Can move all 4 extremities but weak. Hgb up to 8.3 after 1uRBC yesterday. Plan for tunneled cath today. INR 2.1. Remains in rate controlled AF. K up.   General:  Lying in bed. Sleepy but arousable. NAD.  HEENT: normal  Neck: supple. JVP not elevated.  Carotids 2+ bilat; no bruits. No lymphadenopathy or thryomegaly appreciated. Cor: LVAD hum.  Lungs: Clear. Decrease at left base Abdomen: obese soft, nontender, non-distended. No hepatosplenomegaly. No bruits or masses. Good bowel sounds. Driveline site clean. Anchor in place.  Extremities: no cyanosis, clubbing, rash. Warm no edema  Neuro: alert & oriented x 3. No focal deficits. Moves all 4 but is weak  Multiple ongoing issues including HD dependence, weakness, lethargy and anemia. Nursing staff concerned over profound  depression. Unsure if he also has a component of uremia. Will have tunneled cath palced today. Treat hyperkalemia. Start celexa. Continue PT/OT and try to progress.  MAPs ok. VAD interrogated personally. Parameters stable.  Glori Bickers, MD  12:46 PM

## 2020-04-16 NOTE — Progress Notes (Signed)
At approx 0100, pt became very upset. He was seen attempting to take off monitoring wires and blanket. When approached by staff, pt began yelling. Neuro status assessed--alert and oriented w/ exception of time. When asked what year it was, pt became more agitated. Pt then made comments about wanting to "rip chest open and get rid of the device" that was witnessed by two RNs. Suicidal ideation assessed--pt denies and does not pose harm to self but is showing overt signs of depression. Psych consult highly encouraged. De-escalation attempted--pt calmed down but requested to be left alone. RN will continue to monitor very closely.

## 2020-04-16 NOTE — Progress Notes (Signed)
Patient ID: [name] [name], male   DOB: [birth year], 69 y.o.   MRN: [id number] S: Seen on HD and resting comfortably O:BP 99/68   Pulse 81   Temp 97.9 F (36.6 C) (Oral)   Resp 11   Ht 6' (1.829 m)   Wt 95.3 kg   SpO2 95%   BMI 28.49 kg/m   Intake/Output Summary (Last 24 hours) at 04/16/2020 0905 Last data filed at 04/16/2020 0700 Gross per 24 hour  Intake 1505.39 ml  Output 300 ml  Net 1205.39 ml   Intake/Output: I/O last 3 completed shifts: In: 1895.2 [I.V.:253.3; Blood:641; NG/GT:1000.8] Out: 300 [Stool:300]  Intake/Output this shift:  No intake/output data recorded. Weight change: -8.5 kg Gen: NAD CVS: mechanical hum Resp: CTA Abd: +BS, soft Ext: trace edema  Recent Labs  Lab 04/11/20 1822 04/12/20 0405 04/12/20 1610 04/13/20 0418 04/14/20 0440 04/15/20 0417 04/16/20 0450  NA 137 138 136 138 136 137 134*  K 4.5 4.6 4.9 5.2* 6.1* 4.4 5.0  CL 104 105 102 106 101 96* 96*  CO2 _0 GLUCOSE 103* 102* 107* 96 109* 71 76  BUN 36* 36* 32* 51* 96* 71* 97*  CREATININE 2.16* 2.12* 1.89* 2.87* 5.52* 4.54* 6.32*  ALBUMIN 2.1* 2.0* 2.1* 2.0* 2.1* 1.9* 2.0*  CALCIUM 8.2* 8.0* 8.0* 7.8* 8.2* 8.1* 8.2*  PHOS 2.8 2.8 2.6 2.9 4.2 5.2* 6.9*   Liver Function Tests: Recent Labs  Lab 04/14/20 0440 04/15/20 0417 04/16/20 0450  ALBUMIN 2.1* 1.9* 2.0*   No results for input(s): LIPASE, AMYLASE in the last 168 hours. No results for input(s): AMMONIA in the last 168 hours. CBC: Recent Labs  Lab 04/14/20 0440 04/14/20 0440 04/14/20 1622 04/14/20 1622 04/15/20 0445 04/15/20 1700 04/16/20 0450  WBC 12.0*   < > 8.7   < > 9.3 8.4 9.0  HGB 7.1*   < > 7.5*   < > 7.4* 8.4* 8.3*  HCT 23.5*   < > 23.4*   < > 23.4* 26.1* 26.0*  MCV 91.4  --  88.6  --  91.1 89.1 89.7  PLT 193   < > 196   < > 206 226 244   < > = values in this interval not displayed.   Cardiac Enzymes: No results for input(s): CKTOTAL, CKMB, CKMBINDEX, TROPONINI in the last 168  hours. CBG: Recent Labs  Lab 04/15/20 1202 04/15/20 1606 04/15/20 1938 04/15/20 2330 04/16/20 0508  GLUCAP 88 77 133* 158* 79    Iron Studies: No results for input(s): IRON, TIBC, TRANSFERRIN, FERRITIN in the last 72 hours. Studies/Results: DG Chest Port 1 View  Result Date: 04/15/2020 CLINICAL DATA:  Chest pain EXAM: PORTABLE CHEST 1 VIEW COMPARISON:  April 10, 2020 FINDINGS: There is consolidation in the left base region with small left pleural effusion. There is fibrotic change in the lung bases superimposed. Feeding tube tip is below the diaphragm. There is cardiomegaly. There is a left ventricular assist device. Pacemaker leads are attached to the right atrium and right ventricle. There is a left atrial appendage clamp. The pulmonary vascularity is within normal limits. No adenopathy. There is aortic atherosclerosis. No bone lesions. IMPRESSION: Consolidation left lower lobe concerning for pneumonia. Small left pleural effusion. Underlying bibasilar fibrosis. Cardiomegaly with postoperative changes including left ventricular assist device, pacemaker with leads attached to right atrium and right ventricle, and apparent left atrial appendage clamp. Feeding tube tip below diaphragm. Aortic Atherosclerosis (ICD10-I70.0). Electronically Signed   By:  Lowella Grip III M.D.   On: 04/15/2020 08:52   . aspirin  81 mg Per Tube Daily  . atorvastatin  80 mg Per Tube q1800  . B-complex with vitamin C  1 tablet Per Tube Daily  . bisacodyl  10 mg Oral Daily   Or  . bisacodyl  10 mg Rectal Daily  . chlorhexidine gluconate (MEDLINE KIT)  15 mL Mouth Rinse BID  . Chlorhexidine Gluconate Cloth  6 each Topical Daily  . Chlorhexidine Gluconate Cloth  6 each Topical Q0600  . citalopram  10 mg Per Tube Daily  . darbepoetin (ARANESP) injection - NON-DIALYSIS  100 mcg Subcutaneous Q Tue-1800  . feeding supplement (PRO-STAT SUGAR FREE 64)  30 mL Per Tube BID  . influenza vaccine adjuvanted  0.5 mL  Intramuscular Tomorrow-1000  . insulin aspart  0-24 Units Subcutaneous Q4H  . insulin aspart  6 Units Subcutaneous Q4H  . insulin glargine  24 Units Subcutaneous BID  . mouth rinse  15 mL Mouth Rinse 10 times per day  . midodrine  5 mg Per Tube TID WC  . pantoprazole (PROTONIX) IV  40 mg Intravenous Daily  . sodium chloride flush  10-40 mL Intracatheter Q12H  . sodium zirconium cyclosilicate  10 g Oral Daily  . warfarin  0.5 mg Oral ONCE-1600  . Warfarin - Pharmacist Dosing Inpatient   Does not apply q1600    BMET    Component Value Date/Time   NA 134 (L) 04/16/2020 0450   NA 132 (L) 03/07/2020 1501   K 5.0 04/16/2020 0450   CL 96 (L) 04/16/2020 0450   CO2 25 04/16/2020 0450   GLUCOSE 76 04/16/2020 0450   BUN 97 (H) 04/16/2020 0450   BUN 16 03/07/2020 1501   CREATININE 6.32 (H) 04/16/2020 0450   CREATININE 1.29 (H) 12/10/2016 1011   CALCIUM 8.2 (L) 04/16/2020 0450   GFRNONAA 8 (L) 04/16/2020 0450   GFRNONAA 57 (L) 01/28/2015 1558   GFRAA 9 (L) 04/16/2020 0450   GFRAA 66 01/28/2015 1558   CBC    Component Value Date/Time   WBC 9.0 04/16/2020 0450   RBC 2.90 (L) 04/16/2020 0450   HGB 8.3 (L) 04/16/2020 0450   HGB 15.8 04/24/2018 1148   HCT 26.0 (L) 04/16/2020 0450   HCT 45.9 04/24/2018 1148   PLT 244 04/16/2020 0450   PLT 281 04/24/2018 1148   MCV 89.7 04/16/2020 0450   MCV 89 04/24/2018 1148   MCH 28.6 04/16/2020 0450   MCHC 31.9 04/16/2020 0450   RDW 15.8 (H) 04/16/2020 0450   RDW 14.2 04/24/2018 1148   LYMPHSABS 0.4 (L) 04/02/2020 0429   LYMPHSABS 1.1 04/24/2018 1148   MONOABS 2.2 (H) 04/02/2020 0429   EOSABS 0.0 04/02/2020 0429   EOSABS 0.2 04/24/2018 1148   BASOSABS 0.1 04/02/2020 0429   BASOSABS 0.1 04/24/2018 1148    Assessment/Plan:  1. AKI, in setting of cardiogenic shock, pressors, s/p LVAD 04/07/2020. CRRT initiated on 03/25/20 and stopped 04/12/20 since he is off of pressors. Tolerated his first intermittent HD 04/14/20 1. Seen on HD with low SBP but  MAP >70. 2. No heparin with HD on systemic anticoagulation 2. Hyperkalemia- improved with HD 3. Vascular access- will need tunneled HD catheter and removal of temp HD cath as it has been in place for more than 2 weeks. 1. For tunneled HD catheter placement by IR 4. sCHF s/p LVAD 04/11/2020 5. A fib s/p MAZE and LAA clipping 4/8 6. R MCA  ischemic stroke- large, seen on CT on 03/27/20. 7. Left hemothorax s/p VATS 04/11/2020. 8. Anemia- s/p transfusion 04/14/20 without significant improvement of Hgb.  Continue to follow.  9. MVP s/p MV repair   A. , MD [facility] [phone]  

## 2020-04-16 NOTE — Procedures (Signed)
  Procedure: R IJ tunneled HD Palindrome 19 catheter   EBL:   minimal Complications:  none immediate  See full dictation in [facility].  Dillard Cannon MD Main # [phone] Pager  [phone]

## 2020-04-16 NOTE — Progress Notes (Signed)
Chaplain engaged in initial visit with [name] [name].  Chaplain offered support.  [name] [name] expressed possibly wanting "last rights" and when asked about him being [place] he stated, "I used to be."  Chaplain tried to get [name] [name] to further express his needs but he eventually stopped talking.  Chaplain began to just [name] [name] know that he was not alone and continue to offer her support.  Chaplain asked if she could pray over [name] [name] and he nodded his head yes.  Chaplain offered prayer.  Chaplain will continue to follow-up and convey to [name] [name] that he is not alone and that he has support.

## 2020-04-16 NOTE — Progress Notes (Signed)
SLP Cancellation Note  Patient Details Name: CURLIE MACKEN MRN: [id number] DOB: 11-17-1951   Cancelled treatment:       Reason Eval/Treat Not Completed: Patient at procedure or test/unavailable(Pt is NPO for tunnel cath. SLP will follow up on subsequent date unless clinically indicated sooner.)  Maleiah Dula I. Hardin Negus, [place], [place] Office number [phone] Pager [place] 04/16/2020, 9:32 AM

## 2020-04-16 NOTE — Progress Notes (Signed)
LVAD Coordinator Rounding Note:  HM III LVAD implanted on 04/04/2020 with MAZE procedure + LAA clipping  by Dr Orvan Seen under destination therapy criteria due to advanced age. Primary Heart Failure Cardiologist is Dr. Haroldine Laws.   Pt asleep, HD dialysis in progress. Dr. Marval Regal at bedside assessing patient.  Scheduled for tunneled HD cath today.  Vital signs: Temp: 97.9 HR: 86 - afib Cuff: 87/72 (79) Doppler: not done O2 Sat: 95% on RA Wt: 253.3>262.3>260.8>259.9>237.8>...220.4>219.5>218.2>222.4>229>210 lbs   LVAD interrogation reveals:  Speed: 5300 Flow: 4.5 Power: 3.7w  PI: 3.4  Alarms: none  Events: rare Hematocrit: 26  Fixed speed: 5300 Low speed limit: 5000   Drive Line: CDI. Drive line anchor secure.  Dressing changes twice weekly (Monday / Thursday) dressing changes per nurse champion or VAD coordinator.  Next dressing change due 04/17/20.   Labs:  LDH trend: 360>513>482>...908 194 3347  INR trend: 1.3>6.2>9>4.4>6.2>2.2....2.8>3.1>2.4>2.3>2.5>2.1>2.1  CR: 3.57>3.98>3.18>2.44...>4.3>3.6>2.53>2.25>2.16>1.88>1.83>1.88>2.3>5.52>4.54>6.3  Anticoagulation Plan: -INR Goal: 1.8 - 2.3  -ASA Dose: 81 mg   Respiratory: - extubated 4/9/2 - re-intubated 03/28/2020 s/p left hemothorax with cardiac arrest - extubated 04/06/20  Renal:  - CVVH started 03/25/20 - Intermittent HD dialysis started 04/14/20  Neuro: - Right MCA CVA on 03/27/20   Nitric Oxide: off 03/23/20  Blood Products:  Intraop: 6 FFP  DDAVP  420 cell saver  04/06/2020>>4 FFP 03/21/20>> 1 RBC 03/22/20>>1 RBC  Intra-op: 04/08/2020>>2 PCs      2 FFP Post-op: 04/07/2020>>3 PC's, 2 FFP 04/14/20>>1 PC's 04/15/20>> 1 PC's  Cultures:  -03/25/20- blood cultures>>negative - 03/23/2020-blood cultures>>negative - 04/03/20 - respiratory culture>>neg   Device: -St [name] -Therapies: VF 194 - therapy on  Drips: - Milrinone 0.125 mcg/kg/min >> off 04/09/20 - Levo 1 mcg/min>> off 04/10/20 - Amiodarone 30  mg/hr-off - Tube feed: 50 mL/hr - holding for procedure today  Adverse Events on VAD: >>03/25/20 CVVH Started >>03/22/2020 left hemothorax; cardiac arrest >>04/14/20 HD  Pt Education:  1. Pt not appropriate for teaching at this time. Does not follow commands. 2. No family at bedside.  Plan/Recommendations:  1. Call VAD coordinator for any equipment or drive line issues.  2. Dressing changes Monday / Thursday per nurse champion or VAD coordinator.   Zada Girt RN [place] Coordinator  Office: [phone]  24/7 Pager: [phone]

## 2020-04-17 ENCOUNTER — Encounter (HOSPITAL_COMMUNITY): Payer: Self-pay

## 2020-04-17 DIAGNOSIS — R531 Weakness: Secondary | ICD-10-CM

## 2020-04-17 DIAGNOSIS — R570 Cardiogenic shock: Secondary | ICD-10-CM | POA: Diagnosis not present

## 2020-04-17 LAB — COOXEMETRY PANEL
Carboxyhemoglobin: 2.9 % — ABNORMAL HIGH (ref 0.5–1.5)
Methemoglobin: 1.1 % (ref 0.0–1.5)
O2 Saturation: 98.3 %
Total hemoglobin: 10.6 g/dL — ABNORMAL LOW (ref 12.0–16.0)

## 2020-04-17 LAB — CBC
HCT: 23.5 % — ABNORMAL LOW (ref 39.0–52.0)
Hemoglobin: 7.4 g/dL — ABNORMAL LOW (ref 13.0–17.0)
MCH: 28.2 pg (ref 26.0–34.0)
MCHC: 31.5 g/dL (ref 30.0–36.0)
MCV: 89.7 fL (ref 80.0–100.0)
Platelets: 252 10*3/uL (ref 150–400)
RBC: 2.62 MIL/uL — ABNORMAL LOW (ref 4.22–5.81)
RDW: 15.8 % — ABNORMAL HIGH (ref 11.5–15.5)
WBC: 8.8 10*3/uL (ref 4.0–10.5)
nRBC: 0 % (ref 0.0–0.2)

## 2020-04-17 LAB — RENAL FUNCTION PANEL
Albumin: 2.4 g/dL — ABNORMAL LOW (ref 3.5–5.0)
Anion gap: 14 (ref 5–15)
BUN: 57 mg/dL — ABNORMAL HIGH (ref 8–23)
CO2: 24 mmol/L (ref 22–32)
Calcium: 8 mg/dL — ABNORMAL LOW (ref 8.9–10.3)
Chloride: 95 mmol/L — ABNORMAL LOW (ref 98–111)
Creatinine, Ser: 4.34 mg/dL — ABNORMAL HIGH (ref 0.61–1.24)
GFR calc Af Amer: 15 mL/min — ABNORMAL LOW (ref >60)
GFR calc non Af Amer: 13 mL/min — ABNORMAL LOW (ref >60)
Glucose, Bld: 191 mg/dL — ABNORMAL HIGH (ref 70–99)
Phosphorus: 5.9 mg/dL — ABNORMAL HIGH (ref 2.5–4.6)
Potassium: 4 mmol/L (ref 3.5–5.1)
Sodium: 133 mmol/L — ABNORMAL LOW (ref 135–145)

## 2020-04-17 LAB — MAGNESIUM: Magnesium: 2.2 mg/dL (ref 1.7–2.4)

## 2020-04-17 LAB — GLUCOSE, CAPILLARY
Glucose-Capillary: 107 mg/dL — ABNORMAL HIGH (ref 70–99)
Glucose-Capillary: 108 mg/dL — ABNORMAL HIGH (ref 70–99)
Glucose-Capillary: 118 mg/dL — ABNORMAL HIGH (ref 70–99)
Glucose-Capillary: 123 mg/dL — ABNORMAL HIGH (ref 70–99)
Glucose-Capillary: 143 mg/dL — ABNORMAL HIGH (ref 70–99)
Glucose-Capillary: 144 mg/dL — ABNORMAL HIGH (ref 70–99)
Glucose-Capillary: 198 mg/dL — ABNORMAL HIGH (ref 70–99)

## 2020-04-17 LAB — LACTATE DEHYDROGENASE: LDH: 224 U/L — ABNORMAL HIGH (ref 98–192)

## 2020-04-17 LAB — BRAIN NATRIURETIC PEPTIDE: B Natriuretic Peptide: 582.1 pg/mL — ABNORMAL HIGH (ref 0.0–100.0)

## 2020-04-17 LAB — PROTIME-INR
INR: 2.4 — ABNORMAL HIGH (ref 0.8–1.2)
Prothrombin Time: 25 seconds — ABNORMAL HIGH (ref 11.4–15.2)

## 2020-04-17 MED ORDER — ALUM & MAG HYDROXIDE-SIMETH 200-200-20 MG/5ML PO SUSP
30.0000 mL | Freq: Once | ORAL | Status: AC
  Administered 2020-04-17: 30 mL via ORAL
  Filled 2020-04-17: qty 30

## 2020-04-17 MED ORDER — LIDOCAINE VISCOUS HCL 2 % MT SOLN
15.0000 mL | Freq: Once | OROMUCOSAL | Status: AC
  Administered 2020-04-17: 15 mL via ORAL
  Filled 2020-04-17: qty 15

## 2020-04-17 MED ORDER — NEPRO/CARBSTEADY PO LIQD
237.0000 mL | Freq: Two times a day (BID) | ORAL | Status: DC
  Administered 2020-04-21 – 2020-04-27 (×5): 237 mL via ORAL

## 2020-04-17 NOTE — Progress Notes (Addendum)
Patient ID: [name] [name], male   DOB: [birth year], 69 y.o.   MRN: [id number]    Advanced Heart Failure Rounding Note   Subjective:    - S/p HM3 VAD 4/8 w MAZE procedure + LAA clipping. - Extubated 4/9 - 4/12 LVAD speed increased to 5400 --> echo moderate-severe RV dysfunction, septum mildly left shifted, trivial pericardial effusion.  LVAD speed cut back to 5300.  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  - 4/20 developed hemothorax requiring emergent CT placement and eventual VATS - 4/21 EEG done given concerns for posturing and c/w moderate to severe diffuse encephalopathy. No seizures or epileptiform discharges were seen  - 04/03/20 Pump Stop --> controller change out. CT of head unchanged R MCA CVA - 04/06/20 Extubated - 5/1 CRRT stopped. Had first iHD 5/3.   Remains anuric. Continues on iHD. Tunneled HD cath placed by IR 5/5. CVC removed.   Out of bed this morning. Sitting in chair. More interactive today. No complaints.    VAD Interrogation  Flow 4.3 Speed 5300, Power 3.7  Pulse Index 3.3 No PI events.   VAD interrogated personally. Parameters stable.   Objective:   Weight Range:  Vital Signs:   Temp:  [97.1 F (36.2 C)-98.1 F (36.7 C)] 98.1 F (36.7 C) (05/06 0300) Pulse Rate:  [30-98] 91 (05/06 0800) Resp:  [8-26] 10 (05/06 0800) BP: (73-127)/(50-114) 82/62 (05/06 0800) SpO2:  [89 %-100 %] 95 % (05/06 0800) Weight:  [102.1 kg] 102.1 kg (05/05 1140) Last BM Date: 04/17/20  Weight change: Filed Weights   04/16/20 0500 04/16/20 0745 04/16/20 1140  Weight: 95.3 kg 95.3 kg 102.1 kg    Intake/Output:   Intake/Output Summary (Last 24 hours) at 04/17/2020 0829 Last data filed at 04/17/2020 0800 Gross per 24 hour  Intake 1149 ml  Output 734 ml  Net 415 ml    PHYSICAL EXAM: General:  Elderly male, sitting up in chair, no respiratory difficulty HEENT: normal + NGT Neck: supple. No JVD. + Rt IJ tunneled HD cath. Carotids 2+ bilat; no bruits. No  lymphadenopathy or thyromegaly appreciated. Cor: + LVAD HUM  Lungs: CTAB, no wheezing  Abdomen: soft, nontender, nondistended. No hepatosplenomegaly. No bruits or masses. Good bowel sounds. Drive-line: anchor in place. Clean/dry dressing  Extremities: no cyanosis, clubbing, rash, edema Neuro: awake and moves all 4 extremities w/o difficulty. Follows some commands   Telemetry: chronic AFib 80s  Personally reviewed    Labs: Basic Metabolic Panel: Recent Labs  Lab 04/13/20 0418 04/13/20 0418 04/14/20 0440 04/14/20 0440 04/15/20 0417 04/16/20 0450 04/17/20 0043  NA 138  --  136  --  137 134* 133*  K 5.2*  --  6.1*  --  4.4 5.0 4.0  CL 106  --  101  --  96* 96* 95*  CO2 25  --  25  --  27 25 24   GLUCOSE 96  --  109*  --  71 76 191*  BUN 51*  --  96*  --  71* 97* 57*  CREATININE 2.87*  --  5.52*  --  4.54* 6.32* 4.34*  CALCIUM 7.8*   < > 8.2*   < > 8.1* 8.2* 8.0*  MG 2.5*  --  2.9*  --  2.6* 2.7* 2.2  PHOS 2.9  --  4.2  --  5.2* 6.9* 5.9*   < > = values in this interval not displayed.    Liver Function Tests: Recent Labs  Lab 04/13/20 0418 04/14/20 0440 04/15/20 0417 04/16/20 0450 04/17/20 0043  ALBUMIN 2.0* 2.1* 1.9* 2.0* 2.4*   No results for input(s): LIPASE, AMYLASE in the last 168 hours. No results for input(s): AMMONIA in the last 168 hours.  CBC: Recent Labs  Lab 04/14/20 0440 04/14/20 1622 04/15/20 0445 04/15/20 1700 04/16/20 0450  WBC 12.0* 8.7 9.3 8.4 9.0  HGB 7.1* 7.5* 7.4* 8.4* 8.3*  HCT 23.5* 23.4* 23.4* 26.1* 26.0*  MCV 91.4 88.6 91.1 89.1 89.7  PLT 193 196 206 226 244    Cardiac Enzymes: No results for input(s): CKTOTAL, CKMB, CKMBINDEX, TROPONINI in the last 168 hours.  BNP: BNP (last 3 results) Recent Labs    04/02/20 2342 04/10/20 0132 04/17/20 0043  BNP 332.7* 267.2* 582.1*    ProBNP (last 3 results) No results for input(s): PROBNP in the last 8760 hours.    Other results:  Imaging: IR Fluoro Guide CV Line  Right  Result Date: 04/16/2020 CLINICAL DATA:  Renal insufficiency, needs durable venous access for hemodialysis. EXAM: TUNNELED HEMODIALYSIS CATHETER PLACEMENT WITH ULTRASOUND AND FLUOROSCOPIC GUIDANCE TECHNIQUE: The procedure, risks, benefits, and alternatives were explained to the patient. Questions regarding the procedure were encouraged and answered. The patient understands and consents to the procedure. Patency of the right IJ vein was confirmed with ultrasound with image documentation. An appropriate skin site was determined. Region was prepped using maximum barrier technique including cap and mask, sterile gown, sterile gloves, large sterile sheet, and Chlorhexidine as cutaneous antisepsis. The region was infiltrated locally with 1% lidocaine. Intravenous Fentanyl 69mcg and Versed 0.5mg  were administered as conscious sedation during continuous monitoring of the patient's level of consciousness and physiological / cardiorespiratory status by the radiology RN, with a total moderate sedation time of 10 minutes. Under real-time ultrasound guidance, the right IJ vein was accessed with a 21 gauge micropuncture needle; the needle tip within the vein was confirmed with ultrasound image documentation. Needle exchanged over the 018 guidewire for transitional dilator, which allowed advancement of a Benson wire into the IVC. Over this, an MPA catheter was advanced. A Palindrome 19 hemodialysis catheter was tunneled from the right anterior chest wall approach to the right IJ dermatotomy site. The MPA catheter was exchanged over an Amplatz wire for serial vascular dilators which allow placement of a peel-away sheath, through which the catheter was advanced under intermittent fluoroscopy, positioned with its tips in the proximal and midright atrium. Spot chest radiograph confirms good catheter position. No pneumothorax. Catheter was flushed and primed per protocol. Catheter secured externally with O Prolene sutures. The  right IJ dermatotomy site was closed with Dermabond. COMPLICATIONS: COMPLICATIONS None immediate FLUOROSCOPY TIME:  0.6 minute; 144  uGym2 DAP COMPARISON:  None IMPRESSION: 1. Technically successful placement of tunneled right IJ hemodialysis catheter with ultrasound and fluoroscopic guidance. Ready for routine use. ACCESS: Remains approachable for percutaneous intervention as needed. Electronically Signed   By: Lucrezia Europe M.D.   On: 04/16/2020 14:59   IR US Guide Vasc Access Right  Result Date: 04/17/2020 Study Result CLINICAL DATA:  Renal insufficiency, needs durable venous access for hemodialysis.  EXAM: TUNNELED HEMODIALYSIS CATHETER PLACEMENT WITH ULTRASOUND AND FLUOROSCOPIC GUIDANCE  TECHNIQUE: The procedure, risks, benefits, and alternatives were explained to the patient. Questions regarding the procedure were encouraged and answered. The patient understands and consents to the procedure.  Patency of the right IJ vein was confirmed with ultrasound with image documentation. An appropriate skin site was determined. Region was prepped using maximum barrier technique  including cap and mask, sterile gown, sterile gloves, large sterile sheet, and Chlorhexidine as cutaneous antisepsis. The region was infiltrated locally with 1% lidocaine.  Intravenous Fentanyl 38mcg and Versed 0.5mg  were administered as conscious sedation during continuous monitoring of the patient's level of consciousness and physiological / cardiorespiratory status by the radiology RN, with a total moderate sedation time of 10 minutes. Under real-time ultrasound guidance, the right IJ vein was accessed with a 21 gauge micropuncture needle; the needle tip within the vein was confirmed with ultrasound image documentation. Needle exchanged over the 018 guidewire for transitional dilator, which allowed advancement of a Benson wire into the IVC. Over this, an MPA catheter was advanced. A Palindrome 19 hemodialysis catheter was tunneled from the  right anterior chest wall approach to the right IJ dermatotomy site. The MPA catheter was exchanged over an Amplatz wire for serial vascular dilators which allow placement of a peel-away sheath, through which the catheter was advanced under intermittent fluoroscopy, positioned with its tips in the proximal and midright atrium. Spot chest radiograph confirms good catheter position. No pneumothorax. Catheter was flushed and primed per protocol. Catheter secured externally with O Prolene sutures. The right IJ dermatotomy site was closed with Dermabond.  COMPLICATIONS: COMPLICATIONS None immediate  FLUOROSCOPY TIME:  0.6 minute; 144  uGym2 DAP  COMPARISON:  None  IMPRESSION: 1. Technically successful placement of tunneled right IJ hemodialysis catheter with ultrasound and fluoroscopic guidance. Ready for routine use.  ACCESS: Remains approachable for percutaneous intervention as needed.   Electronically Signed   By: Lucrezia Europe M.D.   On: 04/16/2020 14:59      Medications:     Scheduled Medications: . aspirin  81 mg Per Tube Daily  . atorvastatin  80 mg Per Tube q1800  . B-complex with vitamin C  1 tablet Per Tube Daily  . bisacodyl  10 mg Oral Daily   Or  . bisacodyl  10 mg Rectal Daily  . Chlorhexidine Gluconate Cloth  6 each Topical Daily  . citalopram  10 mg Per Tube Daily  . darbepoetin (ARANESP) injection - NON-DIALYSIS  100 mcg Subcutaneous Q Tue-1800  . feeding supplement (PRO-STAT SUGAR FREE 64)  30 mL Per Tube BID  . influenza vaccine adjuvanted  0.5 mL Intramuscular Tomorrow-1000  . insulin aspart  0-24 Units Subcutaneous Q4H  . insulin aspart  6 Units Subcutaneous Q4H  . insulin glargine  24 Units Subcutaneous BID  . midodrine  5 mg Per Tube TID WC  . pantoprazole (PROTONIX) IV  40 mg Intravenous Daily  . sodium chloride flush  10-40 mL Intracatheter Q12H  . sodium zirconium cyclosilicate  10 g Oral Daily  . Warfarin - Pharmacist Dosing Inpatient   Does not apply q1600     Infusions: . sodium chloride Stopped (04/15/20 1731)  . feeding supplement (NEPRO CARB STEADY) 1,000 mL (04/16/20 1530)  . norepinephrine (LEVOPHED) Adult infusion Stopped (04/11/20 2215)    PRN Medications: sodium chloride, acetaminophen, dextrose, hydrALAZINE, levalbuterol, ondansetron (ZOFRAN) IV, oxyCODONE, sodium chloride flush, sorbitol, traMADol   Assessment/Plan:   1. Acute on chronic systolic HF -> cardiogenic shock-> S/p HM3 LVAD - Echo 2016 EF 30-35% - Echo 1/21 EF 25% - Admitted with NYHA IV symptoms and AKI with attempts at diuresis. Initial co-ox 39% - Echo this admit: EF 10-15% moderate RV dysfunction - R/LHC cath 3/21 with severe 3v CAD and low output with CI 1.7.  - Initially planned for CABG but given need for re-do sternotomy, relatively poor  targets and longstanding low EF, VAD felt to be better option  - S/p HM3 VAD 4/8 - Speed turned up on 4/11 to  5400, but looking at 4/10 echo there is significant RV dysfunction with leftward septal shift so speed decreased back to 5300 rpm.  - Off milrinone. Off NE. On Midodrine 5 tid. MAP 70s. CVC removed 5/5 - Now off CVVH. Now on iHD - Volume status ok.  Continue HD per nephrology   2. VAD - VAD interrogated personally. Parameters stable. - Had Pump Stop 04/03/20. Controller changed out with old controller sent back to [facility] for evaluation.  - on aspirin 81 mg daily + coumadin.  - LDH 224 - INR 2.4 . We have lowered INR goal to 1.8-2.3 given anemia. Discussed dosing with PharmD personally. - Driveline ok   3. CAD with unstable angina - s/p previous PCI. - cath 02/20/2020 with severe 3v CAD - now s/p VAD on 04/03/2020   - No chest pain.   4. AKI on CKD 3a - Nephrology consulted. Started CVVHD 4/13.    - Renal US 4/5 unremarkable.  - Hopefully renal function will recover but Renal team not optimistic - He is now off NE w/ stable MAP.  - Off CVVH.  1st iHD 5/3.  - continue HD per nephrology. Monitor for uremic  symptoms  -  Tunnel HD cath placed 5/5. Appreciate IR  5. Permanent AF - now s/p MAZE + LAA Clipping 4/8   - Rate controlled - On coumadin. INR 2.4  6. MVP s/p MV repair - On admit with recurrent severe MR on echo and with huge v-waves on PCWP tracing - s/p VAD  7. Hemothoax - s/p CT placement and VATs on 4/22 - CTs pulled 4/26 - stable  8. ID/leukocytosis - 4/11 procalcitonin 1.96  - Started empiric coverage for PNA with vancomycin/cefepime for 7 days. Stopped 4/17 - Given recent VATs and high-dose pressor requirements he was treated with broad spectrum abx, linezolid and cefepime (completed course 4/28)  - Remains afebrile  9. Acute Hypoxic Respiratory Failure - Re-intubated 4/20 with hemothorax- - Extubated 4/25 - Remains stable on [place]  10. Malnutrition  - Albumin 2.6  - On tube feeds.  - Speech following, still NPO with ice chips, needs to be re-evaluated    11. CVA/anoxic brain injury - 03/27/20 CT large posterior MCA ischemic infarct, possibly from atrial fibrillation.  INR was supratherapeutic when CVA found.  Stable LDH, do not think partial pump thrombosis is the culprit. - Still with some L weakness and inattention but improving  - PT/OT/speech following.  - CIR consulted.   - Continue to mobilize, very weak.   12. Anemia, post-op  - Received 2u RBC 04/05/20 + 1 Unit on 5/3 + 1 unit 5/4 - CBC pending  - Transfuse if hemoglobin < 7.5  - lower INR goal to 1.8-2.3  13. Hyperkalemia - resolved  - K 4.0  today - Continue iHD  - can use Lokelma PRN on non HD days if needed   14. DM2, poorly controlled - hgbA1c 11.1% - continue insulin   15. Agitation/ Depression  - Celexa 10 mg daily added. Mood better today   - Monitor   16. Deconditioning - try to mobilize more w/ PT/OT   Length of Stay: [address] [place]  8:29 AM  04/17/2020   Agree with above. See my note from later this am for further details   Glori Bickers, MD  11:41  PM

## 2020-04-17 NOTE — Progress Notes (Signed)
Patient ID: [name] [name], male   DOB: [birth year], 69 y.o.   MRN: [id number]    Advanced Heart Failure Rounding Note   Subjective:    - S/p HM3 VAD 4/8 w MAZE procedure + LAA clipping. - Extubated 4/9 - 4/12 LVAD speed increased to 5400 --> echo moderate-severe RV dysfunction, septum mildly left shifted, trivial pericardial effusion.  LVAD speed cut back to 5300.  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  - 4/20 developed hemothorax requiring emergent CT placement and eventual VATS - 4/21 EEG done given concerns for posturing and c/w moderate to severe diffuse encephalopathy. No seizures or epileptiform discharges were seen  - 04/03/20 Pump Stop --> controller change out. CT of head unchanged R MCA CVA - 04/06/20 Extubated - 5/1 CRRT stopped. Had first iHD 5/3.   Remains anuric. Continues on iHD. Tunneled HD cath placed by IR 5/5. CVC removed.   Out of bed this morning. Sitting in chair. More interactive today. No complaints.    VAD Interrogation  Flow 4.3 Speed 5300, Power 3.7  Pulse Index 3.3 No PI events.   VAD interrogated personally. Parameters stable.   Objective:   Weight Range:  Vital Signs:   Temp:  [97.1 F (36.2 C)-98.3 F (36.8 C)] 98.3 F (36.8 C) (05/06 0845) Pulse Rate:  [60-98] 87 (05/06 0900) Resp:  [8-26] 13 (05/06 0900) BP: (73-127)/(50-114) 103/85 (05/06 0900) SpO2:  [89 %-100 %] 95 % (05/06 0900) Weight:  [102.1 kg] 102.1 kg (05/05 1140) Last BM Date: 04/17/20  Weight change: Filed Weights   04/16/20 0500 04/16/20 0745 04/16/20 1140  Weight: 95.3 kg 95.3 kg 102.1 kg    Intake/Output:   Intake/Output Summary (Last 24 hours) at 04/17/2020 0936 Last data filed at 04/17/2020 0800 Gross per 24 hour  Intake 1149 ml  Output 734 ml  Net 415 ml    PHYSICAL EXAM: General:  Elderly male, sitting up in chair, no respiratory difficulty HEENT: normal + NGT Neck: supple. No JVD. + Rt IJ tunneled HD cath. Carotids 2+ bilat; no bruits. No  lymphadenopathy or thyromegaly appreciated. Cor: + LVAD HUM  Lungs: CTAB, no wheezing  Abdomen: soft, nontender, nondistended. No hepatosplenomegaly. No bruits or masses. Good bowel sounds. Drive-line: anchor in place. Clean/dry dressing  Extremities: no cyanosis, clubbing, rash, edema Neuro: awake and moves all 4 extremities w/o difficulty. Follows some commands   Telemetry: chronic AFib 80s  Personally reviewed    Labs: Basic Metabolic Panel: Recent Labs  Lab 04/13/20 0418 04/13/20 0418 04/14/20 0440 04/14/20 0440 04/15/20 0417 04/16/20 0450 04/17/20 0043  NA 138  --  136  --  137 134* 133*  K 5.2*  --  6.1*  --  4.4 5.0 4.0  CL 106  --  101  --  96* 96* 95*  CO2 25  --  25  --  27 25 24   GLUCOSE 96  --  109*  --  71 76 191*  BUN 51*  --  96*  --  71* 97* 57*  CREATININE 2.87*  --  5.52*  --  4.54* 6.32* 4.34*  CALCIUM 7.8*   < > 8.2*   < > 8.1* 8.2* 8.0*  MG 2.5*  --  2.9*  --  2.6* 2.7* 2.2  PHOS 2.9  --  4.2  --  5.2* 6.9* 5.9*   < > = values in this interval not displayed.    Liver Function Tests: Recent Labs  Lab 04/13/20 0418 04/14/20 0440 04/15/20 0417 04/16/20 0450 04/17/20 0043  ALBUMIN 2.0* 2.1* 1.9* 2.0* 2.4*   No results for input(s): LIPASE, AMYLASE in the last 168 hours. No results for input(s): AMMONIA in the last 168 hours.  CBC: Recent Labs  Lab 04/14/20 0440 04/14/20 1622 04/15/20 0445 04/15/20 1700 04/16/20 0450  WBC 12.0* 8.7 9.3 8.4 9.0  HGB 7.1* 7.5* 7.4* 8.4* 8.3*  HCT 23.5* 23.4* 23.4* 26.1* 26.0*  MCV 91.4 88.6 91.1 89.1 89.7  PLT 193 196 206 226 244    Cardiac Enzymes: No results for input(s): CKTOTAL, CKMB, CKMBINDEX, TROPONINI in the last 168 hours.  BNP: BNP (last 3 results) Recent Labs    04/02/20 2342 04/10/20 0132 04/17/20 0043  BNP 332.7* 267.2* 582.1*    ProBNP (last 3 results) No results for input(s): PROBNP in the last 8760 hours.    Other results:  Imaging: IR Fluoro Guide CV Line  Right  Result Date: 04/16/2020 CLINICAL DATA:  Renal insufficiency, needs durable venous access for hemodialysis. EXAM: TUNNELED HEMODIALYSIS CATHETER PLACEMENT WITH ULTRASOUND AND FLUOROSCOPIC GUIDANCE TECHNIQUE: The procedure, risks, benefits, and alternatives were explained to the patient. Questions regarding the procedure were encouraged and answered. The patient understands and consents to the procedure. Patency of the right IJ vein was confirmed with ultrasound with image documentation. An appropriate skin site was determined. Region was prepped using maximum barrier technique including cap and mask, sterile gown, sterile gloves, large sterile sheet, and Chlorhexidine as cutaneous antisepsis. The region was infiltrated locally with 1% lidocaine. Intravenous Fentanyl 54mcg and Versed 0.5mg  were administered as conscious sedation during continuous monitoring of the patient's level of consciousness and physiological / cardiorespiratory status by the radiology RN, with a total moderate sedation time of 10 minutes. Under real-time ultrasound guidance, the right IJ vein was accessed with a 21 gauge micropuncture needle; the needle tip within the vein was confirmed with ultrasound image documentation. Needle exchanged over the 018 guidewire for transitional dilator, which allowed advancement of a Benson wire into the IVC. Over this, an MPA catheter was advanced. A Palindrome 19 hemodialysis catheter was tunneled from the right anterior chest wall approach to the right IJ dermatotomy site. The MPA catheter was exchanged over an Amplatz wire for serial vascular dilators which allow placement of a peel-away sheath, through which the catheter was advanced under intermittent fluoroscopy, positioned with its tips in the proximal and midright atrium. Spot chest radiograph confirms good catheter position. No pneumothorax. Catheter was flushed and primed per protocol. Catheter secured externally with O Prolene sutures. The  right IJ dermatotomy site was closed with Dermabond. COMPLICATIONS: COMPLICATIONS None immediate FLUOROSCOPY TIME:  0.6 minute; 144  uGym2 DAP COMPARISON:  None IMPRESSION: 1. Technically successful placement of tunneled right IJ hemodialysis catheter with ultrasound and fluoroscopic guidance. Ready for routine use. ACCESS: Remains approachable for percutaneous intervention as needed. Electronically Signed   By: Lucrezia Europe M.D.   On: 04/16/2020 14:59   IR US Guide Vasc Access Right  Result Date: 04/17/2020 Study Result CLINICAL DATA:  Renal insufficiency, needs durable venous access for hemodialysis.  EXAM: TUNNELED HEMODIALYSIS CATHETER PLACEMENT WITH ULTRASOUND AND FLUOROSCOPIC GUIDANCE  TECHNIQUE: The procedure, risks, benefits, and alternatives were explained to the patient. Questions regarding the procedure were encouraged and answered. The patient understands and consents to the procedure.  Patency of the right IJ vein was confirmed with ultrasound with image documentation. An appropriate skin site was determined. Region was prepped using maximum barrier technique  including cap and mask, sterile gown, sterile gloves, large sterile sheet, and Chlorhexidine as cutaneous antisepsis. The region was infiltrated locally with 1% lidocaine.  Intravenous Fentanyl 29mcg and Versed 0.5mg  were administered as conscious sedation during continuous monitoring of the patient's level of consciousness and physiological / cardiorespiratory status by the radiology RN, with a total moderate sedation time of 10 minutes. Under real-time ultrasound guidance, the right IJ vein was accessed with a 21 gauge micropuncture needle; the needle tip within the vein was confirmed with ultrasound image documentation. Needle exchanged over the 018 guidewire for transitional dilator, which allowed advancement of a Benson wire into the IVC. Over this, an MPA catheter was advanced. A Palindrome 19 hemodialysis catheter was tunneled from the  right anterior chest wall approach to the right IJ dermatotomy site. The MPA catheter was exchanged over an Amplatz wire for serial vascular dilators which allow placement of a peel-away sheath, through which the catheter was advanced under intermittent fluoroscopy, positioned with its tips in the proximal and midright atrium. Spot chest radiograph confirms good catheter position. No pneumothorax. Catheter was flushed and primed per protocol. Catheter secured externally with O Prolene sutures. The right IJ dermatotomy site was closed with Dermabond.  COMPLICATIONS: COMPLICATIONS None immediate  FLUOROSCOPY TIME:  0.6 minute; 144  uGym2 DAP  COMPARISON:  None  IMPRESSION: 1. Technically successful placement of tunneled right IJ hemodialysis catheter with ultrasound and fluoroscopic guidance. Ready for routine use.  ACCESS: Remains approachable for percutaneous intervention as needed.   Electronically Signed   By: Lucrezia Europe M.D.   On: 04/16/2020 14:59      Medications:     Scheduled Medications: . aspirin  81 mg Per Tube Daily  . atorvastatin  80 mg Per Tube q1800  . B-complex with vitamin C  1 tablet Per Tube Daily  . bisacodyl  10 mg Oral Daily   Or  . bisacodyl  10 mg Rectal Daily  . Chlorhexidine Gluconate Cloth  6 each Topical Daily  . citalopram  10 mg Per Tube Daily  . darbepoetin (ARANESP) injection - NON-DIALYSIS  100 mcg Subcutaneous Q Tue-1800  . feeding supplement (PRO-STAT SUGAR FREE 64)  30 mL Per Tube BID  . influenza vaccine adjuvanted  0.5 mL Intramuscular Tomorrow-1000  . insulin aspart  0-24 Units Subcutaneous Q4H  . insulin aspart  6 Units Subcutaneous Q4H  . insulin glargine  24 Units Subcutaneous BID  . midodrine  5 mg Per Tube TID WC  . pantoprazole (PROTONIX) IV  40 mg Intravenous Daily  . sodium chloride flush  10-40 mL Intracatheter Q12H  . sodium zirconium cyclosilicate  10 g Oral Daily  . Warfarin - Pharmacist Dosing Inpatient   Does not apply q1600     Infusions: . sodium chloride Stopped (04/15/20 1731)  . feeding supplement (NEPRO CARB STEADY) 1,000 mL (04/16/20 1530)  . norepinephrine (LEVOPHED) Adult infusion Stopped (04/11/20 2215)    PRN Medications: sodium chloride, acetaminophen, dextrose, hydrALAZINE, levalbuterol, ondansetron (ZOFRAN) IV, oxyCODONE, sodium chloride flush, sorbitol, traMADol   Assessment/Plan:   1. Acute on chronic systolic HF -> cardiogenic shock-> S/p HM3 LVAD - Echo 2016 EF 30-35% - Echo 1/21 EF 25% - Admitted with NYHA IV symptoms and AKI with attempts at diuresis. Initial co-ox 39% - Echo this admit: EF 10-15% moderate RV dysfunction - R/LHC cath 3/21 with severe 3v CAD and low output with CI 1.7.  - Initially planned for CABG but given need for re-do sternotomy, relatively poor  targets and longstanding low EF, VAD felt to be better option  - S/p HM3 VAD 4/8 - Speed turned up on 4/11 to  5400, but looking at 4/10 echo there is significant RV dysfunction with leftward septal shift so speed decreased back to 5300 rpm.  - Off milrinone. Off NE. On Midodrine 5 tid. MAP 70s. CVC removed 5/5 - Now off CVVH. Now on iHD - Volume status ok.  Continue HD per nephrology   2. VAD - VAD interrogated personally. Parameters stable. - Had Pump Stop 04/03/20. Controller changed out with old controller sent back to [facility] for evaluation.  - on aspirin 81 mg daily + coumadin.  - LDH 224 - INR 2.4 . We have lowered INR goal to 1.8-2.3 given anemia. Discussed dosing with PharmD personally. - Driveline ok   3. CAD with unstable angina - s/p previous PCI. - cath 02/23/2020 with severe 3v CAD - now s/p VAD on 03/21/2020   - No chest pain.   4. AKI on CKD 3a - Nephrology consulted. Started CVVHD 4/13.    - Renal US 4/5 unremarkable.  - Hopefully renal function will recover but Renal team not optimistic - He is now off NE w/ stable MAP.  - Off CVVH.  1st iHD 5/3.  - continue HD per nephrology. Monitor for uremic  symptoms  -  Tunnel HD cath placed 5/5. Appreciate IR  5. Permanent AF - now s/p MAZE + LAA Clipping 4/8   - Rate controlled - On coumadin. INR 2.4  6. MVP s/p MV repair - On admit with recurrent severe MR on echo and with huge v-waves on PCWP tracing - s/p VAD  7. Hemothoax - s/p CT placement and VATs on 4/22 - CTs pulled 4/26 - stable  8. ID/leukocytosis - 4/11 procalcitonin 1.96  - Started empiric coverage for PNA with vancomycin/cefepime for 7 days. Stopped 4/17 - Given recent VATs and high-dose pressor requirements he was treated with broad spectrum abx, linezolid and cefepime (completed course 4/28)  - Remains afebrile  9. Acute Hypoxic Respiratory Failure - Re-intubated 4/20 with hemothorax- - Extubated 4/25 - Remains stable on [place]  10. Malnutrition  - Albumin 2.6  - On tube feeds.  - Speech following, still NPO with ice chips, needs to be re-evaluated    11. CVA/anoxic brain injury - 03/27/20 CT large posterior MCA ischemic infarct, possibly from atrial fibrillation.  INR was supratherapeutic when CVA found.  Stable LDH, do not think partial pump thrombosis is the culprit. - Still with some L weakness and inattention but improving  - PT/OT/speech following.  - CIR consulted.   - Continue to mobilize, very weak.   12. Anemia, post-op  - Received 2u RBC 04/05/20 + 1 Unit on 5/3 + 1 unit 5/4 - CBC pending  - Transfuse if hemoglobin < 7.5  - lower INR goal to 1.8-2.3  13. Hyperkalemia - resolved  - K 4.0  today - Continue iHD  - can use Lokelma PRN on non HD days if needed   14. DM2, poorly controlled - hgbA1c 11.1% - continue insulin   15. Agitation/ Depression  - Celexa 10 mg daily added. Mood better today   - Monitor   16. Deconditioning - try to mobilize more w/ PT/OT   Length of Stay: 6   Glori Bickers, MD  9:36 AM  04/17/2020    Patient seen and examined with the above-signed Advanced Practice Provider and/or Housestaff. I personally  reviewed laboratory data,  imaging studies and relevant notes. I independently examined the patient and formulated the important aspects of the plan. I have edited the note to reflect any of my changes or salient points. I have personally discussed the plan with the patient and/or family.   Sitting in chair (took full assist from 2 people to get him there). More alert and interactive. Denies SOB, orthopnea or PND. Remains anuric. Tunneled cath now in. Tolerating iHD. MAPs 70s on midodrine 5 tid. INR 2.4. VAD interrogated personally. Parameters stable.  General:  Sitting up in chair NAD HEENT: normal  Neck: supple. JVP not elevated.  Carotids 2+ bilat; no bruits. No lymphadenopathy or thryomegaly appreciated. + LSC tunneled cath Cor: LVAD hum.  Lungs: Clear. Decreased left base Abdomen: obese soft, nontender, non-distended. No hepatosplenomegaly. No bruits or masses. Good bowel sounds. Driveline site clean. Anchor in place.  Extremities: no cyanosis, clubbing, rash. Warm no edema  Neuro: alert & oriented x 3. No focal deficits. Moves all 4 but weak   Progressing slowly. Still HD dependent but now off pressors. VAD parameters stable. Continue to progress. Still hasn't passed swallow study.  Hopefully can get him to [facility] in next day or 2.   Glori Bickers, MD  9:39 AM

## 2020-04-17 NOTE — Progress Notes (Signed)
Nutrition Follow-up  DOCUMENTATION CODES:   Not applicable  INTERVENTION:   Continue Cortrak tube with supplemental TF until pt demonstrating tolerance of po intake with consistent intake  Add Magic cup BID with meals, each supplement provides 290 kcal and 9 grams of protein  Add Nepro Shake po BID, each supplement provides 425 kcal and 19 grams protein  Tube Feeding:  Nepro at 50 ml/hr Pro-Stat 30 mL BID Provides 2360 kcals, 127 g of protein and 876 mL of free water Meets 100% estimated calorie and protein needs  Continue B-complex with C  NUTRITION DIAGNOSIS:   Increased nutrient needs related to post-op healing, chronic illness as evidenced by estimated needs.  Being addressed via TF, supplements  GOAL:   Patient will meet greater than or equal to 90% of their needs  Met  MONITOR:   PO intake, Supplement acceptance, Labs, Weight trends  REASON FOR ASSESSMENT:   Consult LVAD Eval  ASSESSMENT:   69 yo male admitted with acute on chronic CHF, AKI on CKD 3. Initial plan for CABG but with re-do sternotomy, poor targets and longstanding low EF plan for LVAD instead. PMH includes CAD, CKD, DM   3/31 Admit, Cath with severe 3v CAD 4/03 Swan placed  4/06 Teeth extraction 4/08 HeartMate 3 LVAD placed, re-do sternotomy, Intubated 4/09 Extubated 4/13 CRRT initiated 4/14 Cortrak placed, TF started 4/15 Head CT: right [facility] CVA 4/19 CRRT stopped, respiratory arrest requiring intubation 4/20 Hemothorax requiring emergent chest tube placement, VATS, CRRT re-initiated 4/21 Cortrak advanced to proximal duodenum; Concern for posturing, EEG 4/25 Extubated 4/26 Cortrak repositioned 5/01 CRRT discontinued 5/03 1st iHD 5/06 Diet advanced to Dsyphagia 1, Thins  Pt working with PT, able to stand with 2 person assist today. Pt sat in recliner for 3 hours as well today per RN  Diet advanced to Dysphagia I, thin liquids per SLP today. On visit this morning, pt lethargic/sleepy.  Did not open eyes during assessment. Pt did smile when I told him he was going to be able to eat something today. Pt did respond when I manipulated cortrak tube; grimaced in pain. Nose with clotted blood and gunk and concern for possible skin breakdown. Discussed with RN who plans to attempt to clean nose and place vaseline as barrier  Given poor overall mental status at present and overall deconditioning, unsure how much po intake pt will take at present time. Plan to continue with Cortrak tube feedings for now  Labs: sodium 133 (L), Creatinine 4.34, BUN 57, phosphorus 5.9  Meds: B-complex with C   Diet Order:   Diet Order            DIET - DYS 1 Room service appropriate? Yes with Assist; Fluid consistency: Thin  Diet effective now              EDUCATION NEEDS:   Education needs have been addressed  Skin:  Skin Assessment: Skin Integrity Issues: Skin Integrity Issues:: Incisions Incisions: sternum-healing  Last BM:  5/6 rectal tube  Height:   Ht Readings from Last 1 Encounters:  03/21/2020 6' (1.829 m)    Weight:   Wt Readings from Last 1 Encounters:  04/16/20 102.1 kg    BMI:  Body mass index is 30.53 kg/m.  Estimated Nutritional Needs:   Kcal:  2300-2500 kcals  Protein:  120-140g  Fluid:  1000 mL plus UOP   Kerman Passey MS, RDN, LDN, CNSC RD Pager Number and RD On-Call Pager Number Located in [place]

## 2020-04-17 NOTE — Progress Notes (Signed)
CPT held at this time.  Patient states he is feeling very nauseous.

## 2020-04-17 NOTE — Progress Notes (Signed)
Physical Therapy Treatment Patient Details Name: [name] [name] MRN: [id number] DOB: [birth year] Today's Date: 04/17/2020    History of Present Illness 69 yo male with history of CAD with prior stenting of the LAD, Diagonal and OM in an outside hospital, ischemic cardiomyopathy with ICD in place, chronic systolic CHF, MVP s/p mitral valve repair, permanent atrial fib on chronic anticoagulation, HTN, HLD, DM, depression admitted to [facility] with progressive weakness, fatigue, dyspnea and chest pressure. Troponin elevated at 0.06. He was transferred to [facility] for cardiac cath, showing severe 3 vessel disease. Plan for CABG but pt with AKI. Pt underwent multiple tooth extraction on 4/6 and LVAD placement on 4/8. Postop day 7 patient was noted to be agitated the CT head was obtained showing a large posterior right MCA infarct. Pt with arrest on 4/20 and reintubated, extubated 4/25. Pt on CRRT until 04/12/20 when transitioned to HD.Tunnelled HD cath 5/5    PT Comments    Pt sitting in recliner this session with OT on arrival. Pt pleasant and singing talking about all his kids and grandkids. Pt reports having never felt so weak and helpless and that his legs hurt with trying to stand. Pt becomes agitated at times particularly with assist to stand as he can't use UE and I think this frustrates him without complete comprehension of how to sequence. Pt aware of controller today and need to keep strap connected to him with mobility.  Pt able to stand with 2 person assist from chair to stedy and use of stedy to pivot back to bed. Pt demonstrating improved activity tolerance and cognition. Pt educated for sternal precautions and HEP. Will continue to work toward increased activity tolerance and function.   99% on RA HR 80    Follow Up Recommendations  CIR;Supervision/Assistance - 24 hour     Equipment Recommendations  Rolling walker with 5" wheels;3in1 (PT);Wheelchair (measurements PT)     Recommendations for Other Services       Precautions / Restrictions Precautions Precautions: Fall;Sternal Precaution Comments: LVAD, cortrak, flexiseal    Mobility  Bed Mobility Overal bed mobility: Needs Assistance Bed Mobility: Sit to Sidelying;Rolling Rolling: Supervision       Sit to sidelying: Mod assist;+2 for safety/equipment General bed mobility comments: cues for technique, assist for LEs back into bed, pt rolling onto his back without physical assist  Transfers Overall transfer level: Needs assistance   Transfers: Sit to/from Stand Sit to Stand: +2 physical assistance;Mod assist         General transfer comment: maximum multimodal cues for hands on knees, assist of pad under hips and to rise with mod +2 from recliner. Min +2 to stand from stedy pads. max assist to scoot hips to edge of chair. pivot chair to bed with stedy  Ambulation/Gait             General Gait Details: unable   Stairs             Wheelchair Mobility    Modified Rankin (Stroke Patients Only)       Balance Overall balance assessment: Needs assistance   Sitting balance-Leahy Scale: Poor Sitting balance - Comments: required moderate assistance, facilitated anterior pelvic tilt with trucal extension and head up while sitting EOB   Standing balance support: Bilateral upper extremity supported Standing balance-Leahy Scale: Poor Standing balance comment: stood in sara stedy x 5 seconds with min +2 external support and hands on bar  Cognition Arousal/Alertness: Awake/alert Behavior During Therapy: Anxious;Impulsive Overall Cognitive Status: Impaired/Different from baseline Area of Impairment: Orientation;Memory;Safety/judgement;Following commands;Attention                 Orientation Level: Disoriented to;Time Current Attention Level: Sustained Memory: Decreased short-term memory;Decreased recall of precautions Following  Commands: Follows one step commands inconsistently;Follows one step commands with increased time Safety/Judgement: Decreased awareness of deficits Awareness: Intellectual Problem Solving: Slow processing;Decreased initiation;Difficulty sequencing;Requires verbal cues General Comments: pt alert and singing again today, becomes anxious with poor safety and adherence to sternal precautions with sit to stand. Again agitated when trying to stand and unsure if fear or fatigue      Exercises  General Exercises - Lower Extremity Long Arc Quad: AROM;Seated;10 reps Hip Flexion/Marching: AAROM;Both;Seated;AROM;20 reps    General Comments        Pertinent Vitals/Pain Pain Assessment: Faces Faces Pain Scale: Hurts little more Pain Location: LEs Pain Descriptors / Indicators: Discomfort;Grimacing;Guarding Pain Intervention(s): Monitored during session;Repositioned;Limited activity within patient's tolerance    Home Living                      Prior Function            PT Goals (current goals can now be found in the care plan section) Acute Rehab PT Goals Patient Stated Goal: to check on his kids Progress towards PT goals: Progressing toward goals    Frequency    Min 3X/week      PT Plan Current plan remains appropriate    Co-evaluation PT/OT/SLP Co-Evaluation/Treatment: Yes Reason for Co-Treatment: Complexity of the patient's impairments (multi-system involvement);For patient/therapist safety PT goals addressed during session: Mobility/safety with mobility;Strengthening/ROM OT goals addressed during session: ADL's and self-care;Strengthening/ROM      AM-PAC PT "6 Clicks" Mobility   Outcome Measure  Help needed turning from your back to your side while in a flat bed without using bedrails?: A Little Help needed moving from lying on your back to sitting on the side of a flat bed without using bedrails?: Total Help needed moving to and from a bed to a chair  (including a wheelchair)?: Total Help needed standing up from a chair using your arms (e.g., wheelchair or bedside chair)?: Total Help needed to walk in hospital room?: Total Help needed climbing 3-5 steps with a railing? : Total 6 Click Score: 8    End of Session Equipment Utilized During Treatment: Gait belt Activity Tolerance: Patient tolerated treatment well Patient left: in bed;with call bell/phone within reach;with nursing/sitter in room Nurse Communication: Mobility status;Precautions PT Visit Diagnosis: Muscle weakness (generalized) (M62.81);Other abnormalities of gait and mobility (R26.89);Other symptoms and signs involving the nervous system (V56.433)     Time: 2951-8841 PT Time Calculation (min) (ACUTE ONLY): 27 min  Charges:  $Therapeutic Activity: 8-22 mins                     Braylinn Gulden P, PT Acute Rehabilitation Services Pager: [phone] Office: [place] Mcmanigal 04/17/2020, 1:07 PM

## 2020-04-17 NOTE — Progress Notes (Signed)
Unable to perform chest pt through the bed this am. Pt sitting up in chair at bedside at this time. RT will continue to monitor.

## 2020-04-17 NOTE — Progress Notes (Signed)
  Speech Language Pathology Treatment: Dysphagia  Patient Details Name: [name] [name] MRN: [id number] DOB: 02/20/1951 Today's Date: 04/17/2020 Time: 3578-9784 SLP Time Calculation (min) (ACUTE ONLY): 13 min  Assessment / Plan / Recommendation Clinical Impression  Pt was seen for dysphagia treatment and was alert throughout the session. He communicated verbally and was noted to joke with the SLP regarding videos that he was watching on Youtube. No signs/symptoms of aspiration were noted with solids or liquids. Mastication time was increased with regular texture solids. The impact of his edentulous status on this is considered. No significant oral residue was noted. A dysphagia 1 diet with thin liquids is recommended at this time and SLP will continue to follow pt.    HPI HPI: [name] [name] is a 69 year old gentleman with a history of ischemic cardiomyopathy who presented for an LVAD implantation and left atrial appendage clipping with sternal reconstruction on 4/8, extubated 4/9. Started CRRT on 7/84  He had a complicated postop course including a right MCA stroke on 4/15. Evaluated by SLP 4/17, started on a diet.  He had a cardiac arrest on 4/20 requiring reintubation until 4/25. BSE repeated 4/26, with recommendation for NPO. His mental status has been improving slowly for several days.      SLP Plan  Goals updated       Recommendations  Diet recommendations: Thin liquid;Dysphagia 1 (puree) Liquids provided via: Cup;Straw Medication Administration: Crushed with puree(or via Cortrak) Supervision: Staff to assist with self feeding;Full supervision/cueing for compensatory strategies Compensations: Slow rate;Small sips/bites;Minimize environmental distractions Postural Changes and/or Swallow Maneuvers: Seated upright 90 degrees                Oral Care Recommendations: Oral care BID Follow up Recommendations: Inpatient Rehab SLP Visit Diagnosis: Dysphagia, unspecified  (R13.10) Plan: Goals updated       [name] [name] I. [name] [name], [place], [place] Office number [phone] Pager [phone]                 [name] [name] 04/17/2020, 11:04 AM

## 2020-04-17 NOTE — Progress Notes (Signed)
CSW visited bedside although patient was sleeping and did not want to arouse. CSW will continue to follow and reach out to family for supportive intervention. [name], [place], [place]

## 2020-04-17 NOTE — Progress Notes (Signed)
LVAD Coordinator Rounding Note:  HM III LVAD implanted on 03/21/2020 with MAZE procedure + LAA clipping  by Dr Orvan Seen under destination therapy criteria due to advanced age. Primary Heart Failure Cardiologist is Dr. Haroldine Laws.   Pt sitting on the side of the bed working w/PT. Pt is more interactive today. Pt was up in the chair for 3 hrs this morning. Is only able to work with PT a max of 20 minutes.  Had HD cath placed yesterday in IR.   Vital signs: Temp: 97.9 HR: 78 - afib Cuff: 103/85 (93) Doppler: not done O2 Sat: 97% on RA Wt: 253.3>262.3>260.8>259.9>237.8>...220.4>219.5>218.2>222.4>229>210>225 lbs   LVAD interrogation reveals:  Speed: 5300 Flow: 4.1 Power: 3.7w  PI: 3.4  Alarms: none  Events: rare Hematocrit: 26  Fixed speed: 5300 Low speed limit: 5000   Drive Line: Existing VAD dressing removed and site care performed using sterile technique. Drive line exit site cleaned with Chlora prep applicators x 2, allowed to dry, and gauze dressing with silver strip re-applied. Single stitch removed today by Dr. Orvan Seen. Exit site healing and unincorporated, the velour is fully implanted at exit site. Scant amount of serous drainage. No redness, tenderness, foul odor or rash noted. Drive line anchor secure. Dressing changes twice weekly (Monday / Thursday) dressing changes per nurse champion or VAD coordinator.  Next dressing change due 04/21/20.   Labs:  LDH trend: 360>513>482>...283>273>259>243>262>271>241>253>224  INR trend: 1.3>6.2>9>4.4>6.2>2.2....2.8>3.1>2.4>2.3>2.5>2.1>2.1>2.4  CR: 3.57>3.98>3.18>2.44...>4.3>3.6>2.53>2.25>2.16>1.88>1.83>1.88>2.3>5.52>4.54>6.3>4.34  Anticoagulation Plan: -INR Goal: 1.8 - 2.3  -ASA Dose: 81 mg   Respiratory: - extubated 4/9/2 - re-intubated 03/29/2020 s/p left hemothorax with cardiac arrest - extubated 04/06/20  Renal:  - CVVH started 03/25/20 - Intermittent HD dialysis started 04/14/20 - 04/16/20 Tunneled HD cath placed  Neuro: - Right MCA  CVA on 03/27/20   Nitric Oxide: off 03/23/20  Blood Products:  Intraop: 6 FFP  DDAVP  420 cell saver  04/06/2020>>4 FFP 03/21/20>> 1 RBC 03/22/20>>1 RBC  Intra-op: 04/11/2020>>2 PCs      2 FFP Post-op: 03/15/2020>>3 PC's, 2 FFP 04/14/20>>1 PC's 04/15/20>> 1 PC's  Cultures:  -03/25/20- blood cultures>>negative - 03/28/2020-blood cultures>>negative - 04/03/20 - respiratory culture>>neg   Device: -St [name] -Therapies: VF 194 - therapy on  Drips: - Milrinone 0.125 mcg/kg/min >> off 04/09/20 - Levo 1 mcg/min>> off 04/10/20 - Amiodarone 30 mg/hr-off - Tube feed: 50 mL/hr   Adverse Events on VAD: >>03/25/20 CVVH Started >>03/30/2020 left hemothorax; cardiac arrest >>04/14/20 HD  Pt Education:  1. Pt not appropriate for teaching at this time.  2. No family at bedside.  Plan/Recommendations:  1. Call VAD coordinator for any equipment or drive line issues.  2. Dressing changes Monday / Thursday per nurse champion or VAD coordinator.   Tanda Rockers RN [place] Coordinator  Office: [phone]  24/7 Pager: [phone]

## 2020-04-17 NOTE — Progress Notes (Signed)
Inpatient Rehabilitation-Admissions Coordinator   Met with pt bedside. This is the most alert I have seen the patient since I have started following him. He was able to have a simple conversation with me but was not oriented to place or situation when pressed. He did state he is weak and appeared to acknowledge a need for rehab. He gave me permission to speak with his daughter and step daughter Jolayne Haines and Ander Purpura). Spoke with [place] regarding future rehab plans and support at DC when the time comes. She plans to speak with [name] regarding our conversation and see what type of DC support would be available. AC will continue to follow pt's medical progression as well as tolerance with therapies. Hopeful that pt continues with increased participation and alertness to show that he can tolerate an IP Rehab program.  Will also continue to follow up with family to determine support system and dispo plans.   Raechel Ache, OTR/L  Rehab Admissions Coordinator  [phone] 04/17/2020 4:51 PM

## 2020-04-17 NOTE — Progress Notes (Signed)
Occupational Therapy Treatment Patient Details Name: [name] [name] MRN: [id number] DOB: 08/31/1951 Today's Date: 04/17/2020    History of present illness 69 yo male with history of CAD with prior stenting of the LAD, Diagonal and OM in an outside hospital, ischemic cardiomyopathy with ICD in place, chronic systolic CHF, MVP s/p mitral valve repair, permanent atrial fib on chronic anticoagulation, HTN, HLD, DM, depression admitted to [facility] with progressive weakness, fatigue, dyspnea and chest pressure. Troponin elevated at 0.06. He was transferred to [facility] for cardiac cath, showing severe 3 vessel disease. Plan for CABG but pt with AKI. Pt underwent multiple tooth extraction on 4/6 and LVAD placement on 4/8. Postop day 7 patient was noted to be agitated the CT head was obtained showing a large posterior right MCA infarct. Pt with arrest on 4/20 and reintubated, extubated 4/25. Pt on CRRT until 04/12/20 when transitioned to HD.   OT comments  Pt in chair x 3 hours upon arrival. Performed A/AROM B UEs in chair and assisted back to bed with sara stedy and 2 person assist. Focused on trunk strengthening and facilitating anterior pelvic tilt with shoulder retraction in sitting. Pt much more alert and interactive with stable VS on RA. Continues to fatigue quickly and reported B LE pain. He will intensive therapy.   Follow Up Recommendations  CIR;Supervision/Assistance - 24 hour    Equipment Recommendations  None recommended by OT    Recommendations for Other Services      Precautions / Restrictions Precautions Precautions: Fall;Sternal Precaution Comments: LVAD, cortrak, flexiseal Restrictions Weight Bearing Restrictions: Yes(sternal precautions)       Mobility Bed Mobility Overal bed mobility: Needs Assistance Bed Mobility: Sit to Sidelying;Rolling Rolling: Supervision       Sit to sidelying: Mod assist;+2 for safety/equipment General bed mobility comments: cues for  technique, assist for LEs back into bed, pt rolling onto his back without physical assist  Transfers Overall transfer level: Needs assistance   Transfers: Sit to/from Stand Sit to Stand: +2 physical assistance;Mod assist         General transfer comment: maximum multimodal cues for hands on knees, assist of pad under hips and to rise, max assist to scoot hips to edge of chair    Balance Overall balance assessment: Needs assistance   Sitting balance-Leahy Scale: Poor Sitting balance - Comments: required moderate assistance, facilitated anterior pelvic tilt with trucal extension and head up while sitting EOB     Standing balance-Leahy Scale: Poor Standing balance comment: stood in sara stedy x 5 seconds with min +2 external support and hands on bar                           ADL either performed or assessed with clinical judgement   ADL Overall ADL's : Needs assistance/impaired Eating/Feeding: Maximal assistance;Sitting Eating/Feeding Details (indicate cue type and reason): ice chips                                         Vision       Perception     Praxis      Cognition Arousal/Alertness: Awake/alert Behavior During Therapy: Anxious;Impulsive Overall Cognitive Status: Impaired/Different from baseline Area of Impairment: Orientation;Memory;Safety/judgement;Following commands;Attention                 Orientation Level: Disoriented to;Time Current Attention Level: Sustained Memory:  Decreased short-term memory;Decreased recall of precautions Following Commands: Follows one step commands inconsistently;Follows one step commands with increased time Safety/Judgement: Decreased awareness of deficits Awareness: Intellectual Problem Solving: Slow processing;Decreased initiation;Difficulty sequencing;Requires verbal cues General Comments: pt alert and singing again today, becomes anxious with poor safety and adherence to sternal precautions  with sit to stand        Exercises Exercises: General Upper Extremity General Exercises - Upper Extremity Shoulder Flexion: AAROM;10 reps;Seated;Both Elbow Flexion: AAROM;Both;10 reps;Seated Elbow Extension: AAROM;Both;10 reps;Seated Digit Composite Flexion: AROM;Both;5 reps;Seated   Shoulder Instructions       General Comments      Pertinent Vitals/ Pain       Pain Assessment: Faces Faces Pain Scale: Hurts little more Pain Location: LEs Pain Descriptors / Indicators: Discomfort;Grimacing;Guarding Pain Intervention(s): Monitored during session;Repositioned  Home Living                                          Prior Functioning/Environment              Frequency  Min 2X/week        Progress Toward Goals  OT Goals(current goals can now be found in the care plan section)  Progress towards OT goals: Progressing toward goals  Acute Rehab OT Goals Patient Stated Goal: to check on his kids OT Goal Formulation: With patient Time For Goal Achievement: 04/21/20 Potential to Achieve Goals: [place] Discharge plan remains appropriate    Co-evaluation    PT/OT/SLP Co-Evaluation/Treatment: Yes Reason for Co-Treatment: Complexity of the patient's impairments (multi-system involvement)   OT goals addressed during session: ADL's and self-care;Strengthening/ROM      AM-PAC OT "6 Clicks" Daily Activity     Outcome Measure   Help from another person eating meals?: Total   Help from another person toileting, which includes using toliet, bedpan, or urinal?: Total Help from another person bathing (including washing, rinsing, drying)?: Total Help from another person to put on and taking off regular upper body clothing?: Total Help from another person to put on and taking off regular lower body clothing?: Total 6 Click Score: 5    End of Session Equipment Utilized During Treatment: Gait belt  OT Visit Diagnosis: Other abnormalities of gait and  mobility (R26.89);Cognitive communication deficit (R41.841) Symptoms and signs involving cognitive functions: Cerebral infarction   Activity Tolerance Patient tolerated treatment well   Patient Left in bed;with call bell/phone within reach   Nurse Communication Mobility status        Time: 5188-4166 OT Time Calculation (min): 44 min  Charges: OT General Charges $OT Visit: 1 Visit OT Treatments $Neuromuscular Re-education: 23-37 mins  Nestor Lewandowsky, OTR/L Acute Rehabilitation Services Pager: [phone] Office: [phone]   [name] 04/17/2020, 10:22 AM

## 2020-04-17 NOTE — Progress Notes (Signed)
Assisted tele visit to patient with daughter.  Alver Sorrow, RN

## 2020-04-17 NOTE — Progress Notes (Signed)
Patient ID: [name] [name], male   DOB: [birth year], 69 y.o.   MRN: [id number] S: No issues overnight.  No complaints.  O:BP (!) 82/62 (BP Location: Right Arm)   Pulse 91   Temp 98.3 F (36.8 C) (Oral)   Resp 10   Ht 6' (1.829 m)   Wt 102.1 kg   SpO2 95%   BMI 30.53 kg/m   Intake/Output Summary (Last 24 hours) at 04/17/2020 0858 Last data filed at 04/17/2020 0800 Gross per 24 hour  Intake 1149 ml  Output 734 ml  Net 415 ml   Intake/Output: I/O last 3 completed shifts: In: 1549 [Other:30; NG/GT:1425; IV Piggyback:94] Out: 834 [Other:634; Stool:200]  Intake/Output this shift:  Total I/O In: 50 [NG/GT:50] Out: -  Weight change: 0 kg Gen: NAD CVS: mechanical hum Resp: cta Abd: +BS, soft, NT/ND Ext: minimal pretibial edema  Recent Labs  Lab 04/12/20 0405 04/12/20 1610 04/13/20 0418 04/14/20 0440 04/15/20 0417 04/16/20 0450 04/17/20 0043  NA 138 136 138 136 137 134* 133*  K 4.6 4.9 5.2* 6.1* 4.4 5.0 4.0  CL 105 102 106 101 96* 96* 95*  CO2 25 24 25 25 27 25 24   GLUCOSE 102* 107* 96 109* 71 76 191*  BUN 36* 32* 51* 96* 71* 97* 57*  CREATININE 2.12* 1.89* 2.87* 5.52* 4.54* 6.32* 4.34*  ALBUMIN 2.0* 2.1* 2.0* 2.1* 1.9* 2.0* 2.4*  CALCIUM 8.0* 8.0* 7.8* 8.2* 8.1* 8.2* 8.0*  PHOS 2.8 2.6 2.9 4.2 5.2* 6.9* 5.9*   Liver Function Tests: Recent Labs  Lab 04/15/20 0417 04/16/20 0450 04/17/20 0043  ALBUMIN 1.9* 2.0* 2.4*   No results for input(s): LIPASE, AMYLASE in the last 168 hours. No results for input(s): AMMONIA in the last 168 hours. CBC: Recent Labs  Lab 04/14/20 0440 04/14/20 0440 04/14/20 1622 04/14/20 1622 04/15/20 0445 04/15/20 1700 04/16/20 0450  WBC 12.0*   < > 8.7   < > 9.3 8.4 9.0  HGB 7.1*   < > 7.5*   < > 7.4* 8.4* 8.3*  HCT 23.5*   < > 23.4*   < > 23.4* 26.1* 26.0*  MCV 91.4  --  88.6  --  91.1 89.1 89.7  PLT 193   < > 196   < > 206 226 244   < > = values in this interval not displayed.   Cardiac Enzymes: No results for input(s):  CKTOTAL, CKMB, CKMBINDEX, TROPONINI in the last 168 hours. CBG: Recent Labs  Lab 04/16/20 1217 04/16/20 1600 04/16/20 2027 04/17/20 0017 04/17/20 0421  GLUCAP 96 112* 183* 198* 143*    Iron Studies: No results for input(s): IRON, TIBC, TRANSFERRIN, FERRITIN in the last 72 hours. Studies/Results: IR Fluoro Guide CV Line Right  Result Date: 04/16/2020 CLINICAL DATA:  Renal insufficiency, needs durable venous access for hemodialysis. EXAM: TUNNELED HEMODIALYSIS CATHETER PLACEMENT WITH ULTRASOUND AND FLUOROSCOPIC GUIDANCE TECHNIQUE: The procedure, risks, benefits, and alternatives were explained to the patient. Questions regarding the procedure were encouraged and answered. The patient understands and consents to the procedure. Patency of the right IJ vein was confirmed with ultrasound with image documentation. An appropriate skin site was determined. Region was prepped using maximum barrier technique including cap and mask, sterile gown, sterile gloves, large sterile sheet, and Chlorhexidine as cutaneous antisepsis. The region was infiltrated locally with 1% lidocaine. Intravenous Fentanyl 57mcg and Versed 0.5mg  were administered as conscious sedation during continuous monitoring of the patient's level of consciousness and physiological / cardiorespiratory status by the radiology  RN, with a total moderate sedation time of 10 minutes. Under real-time ultrasound guidance, the right IJ vein was accessed with a 21 gauge micropuncture needle; the needle tip within the vein was confirmed with ultrasound image documentation. Needle exchanged over the 018 guidewire for transitional dilator, which allowed advancement of a Benson wire into the IVC. Over this, an MPA catheter was advanced. A Palindrome 19 hemodialysis catheter was tunneled from the right anterior chest wall approach to the right IJ dermatotomy site. The MPA catheter was exchanged over an Amplatz wire for serial vascular dilators which allow  placement of a peel-away sheath, through which the catheter was advanced under intermittent fluoroscopy, positioned with its tips in the proximal and midright atrium. Spot chest radiograph confirms good catheter position. No pneumothorax. Catheter was flushed and primed per protocol. Catheter secured externally with O Prolene sutures. The right IJ dermatotomy site was closed with Dermabond. COMPLICATIONS: COMPLICATIONS None immediate FLUOROSCOPY TIME:  0.6 minute; 144  uGym2 DAP COMPARISON:  None IMPRESSION: 1. Technically successful placement of tunneled right IJ hemodialysis catheter with ultrasound and fluoroscopic guidance. Ready for routine use. ACCESS: Remains approachable for percutaneous intervention as needed. Electronically Signed   By: Lucrezia Europe M.D.   On: 04/16/2020 14:59   IR US Guide Vasc Access Right  Result Date: 04/17/2020 Study Result CLINICAL DATA:  Renal insufficiency, needs durable venous access for hemodialysis.  EXAM: TUNNELED HEMODIALYSIS CATHETER PLACEMENT WITH ULTRASOUND AND FLUOROSCOPIC GUIDANCE  TECHNIQUE: The procedure, risks, benefits, and alternatives were explained to the patient. Questions regarding the procedure were encouraged and answered. The patient understands and consents to the procedure.  Patency of the right IJ vein was confirmed with ultrasound with image documentation. An appropriate skin site was determined. Region was prepped using maximum barrier technique including cap and mask, sterile gown, sterile gloves, large sterile sheet, and Chlorhexidine as cutaneous antisepsis. The region was infiltrated locally with 1% lidocaine.  Intravenous Fentanyl 24mcg and Versed 0.5mg  were administered as conscious sedation during continuous monitoring of the patient's level of consciousness and physiological / cardiorespiratory status by the radiology RN, with a total moderate sedation time of 10 minutes. Under real-time ultrasound guidance, the right IJ vein was accessed  with a 21 gauge micropuncture needle; the needle tip within the vein was confirmed with ultrasound image documentation. Needle exchanged over the 018 guidewire for transitional dilator, which allowed advancement of a Benson wire into the IVC. Over this, an MPA catheter was advanced. A Palindrome 19 hemodialysis catheter was tunneled from the right anterior chest wall approach to the right IJ dermatotomy site. The MPA catheter was exchanged over an Amplatz wire for serial vascular dilators which allow placement of a peel-away sheath, through which the catheter was advanced under intermittent fluoroscopy, positioned with its tips in the proximal and midright atrium. Spot chest radiograph confirms good catheter position. No pneumothorax. Catheter was flushed and primed per protocol. Catheter secured externally with O Prolene sutures. The right IJ dermatotomy site was closed with Dermabond.  COMPLICATIONS: COMPLICATIONS None immediate  FLUOROSCOPY TIME:  0.6 minute; 144  uGym2 DAP  COMPARISON:  None  IMPRESSION: 1. Technically successful placement of tunneled right IJ hemodialysis catheter with ultrasound and fluoroscopic guidance. Ready for routine use.  ACCESS: Remains approachable for percutaneous intervention as needed.   Electronically Signed   By: Lucrezia Europe M.D.   On: 04/16/2020 14:59    . aspirin  81 mg Per Tube Daily  . atorvastatin  80 mg Per Tube q1800  .  B-complex with vitamin C  1 tablet Per Tube Daily  . bisacodyl  10 mg Oral Daily   Or  . bisacodyl  10 mg Rectal Daily  . Chlorhexidine Gluconate Cloth  6 each Topical Daily  . citalopram  10 mg Per Tube Daily  . darbepoetin (ARANESP) injection - NON-DIALYSIS  100 mcg Subcutaneous Q Tue-1800  . feeding supplement (PRO-STAT SUGAR FREE 64)  30 mL Per Tube BID  . influenza vaccine adjuvanted  0.5 mL Intramuscular Tomorrow-1000  . insulin aspart  0-24 Units Subcutaneous Q4H  . insulin aspart  6 Units Subcutaneous Q4H  . insulin glargine   24 Units Subcutaneous BID  . midodrine  5 mg Per Tube TID WC  . pantoprazole (PROTONIX) IV  40 mg Intravenous Daily  . sodium chloride flush  10-40 mL Intracatheter Q12H  . sodium zirconium cyclosilicate  10 g Oral Daily  . Warfarin - Pharmacist Dosing Inpatient   Does not apply q1600    BMET    Component Value Date/Time   NA 133 (L) 04/17/2020 0043   NA 132 (L) 03/07/2020 1501   K 4.0 04/17/2020 0043   CL 95 (L) 04/17/2020 0043   CO2 24 04/17/2020 0043   GLUCOSE 191 (H) 04/17/2020 0043   BUN 57 (H) 04/17/2020 0043   BUN 16 03/07/2020 1501   CREATININE 4.34 (H) 04/17/2020 0043   CREATININE 1.29 (H) 12/10/2016 1011   CALCIUM 8.0 (L) 04/17/2020 0043   GFRNONAA 13 (L) 04/17/2020 0043   GFRNONAA 57 (L) 01/28/2015 1558   GFRAA 15 (L) 04/17/2020 0043   GFRAA 66 01/28/2015 1558   CBC    Component Value Date/Time   WBC 9.0 04/16/2020 0450   RBC 2.90 (L) 04/16/2020 0450   HGB 8.3 (L) 04/16/2020 0450   HGB 15.8 04/24/2018 1148   HCT 26.0 (L) 04/16/2020 0450   HCT 45.9 04/24/2018 1148   PLT 244 04/16/2020 0450   PLT 281 04/24/2018 1148   MCV 89.7 04/16/2020 0450   MCV 89 04/24/2018 1148   MCH 28.6 04/16/2020 0450   MCHC 31.9 04/16/2020 0450   RDW 15.8 (H) 04/16/2020 0450   RDW 14.2 04/24/2018 1148   LYMPHSABS 0.4 (L) 04/02/2020 0429   LYMPHSABS 1.1 04/24/2018 1148   MONOABS 2.2 (H) 04/02/2020 0429   EOSABS 0.0 04/02/2020 0429   EOSABS 0.2 04/24/2018 1148   BASOSABS 0.1 04/02/2020 0429   BASOSABS 0.1 04/24/2018 1148     Assessment/Plan:  1. AKI, in setting of cardiogenic shock, pressors, s/p LVAD 04/04/2020. CRRT initiated on 03/25/20 and stopped 04/12/20 since he is off of pressors.Tolerated hisfirst intermittent HD 04/14/20 1. Tolerated HD 04/16/20.  Will plan for HD again tomorrow. 2. No heparin with HD on systemic anticoagulation 2. Hyperkalemia-improved with HD 3. Vascular access- temp cath removed and RIJ TDC placed 04/16/20 by IR. 4. sCHF s/p LVAD 04/06/2020 5. A fib  s/p MAZE and LAA clipping 4/8 6. R MCA ischemic stroke- large, seen on CT on 03/27/20. 7. Left hemothorax s/p VATS 03/22/2020. 8. Anemia-s/p transfusion 04/14/20 without significant improvement of Hgb. Continue to follow.  9. MVP s/p MV repair  Donetta Potts, MD [facility] [phone]

## 2020-04-17 NOTE — Progress Notes (Signed)
ANTICOAGULATION CONSULT NOTE  Pharmacy Consult for warfarin Indication: post op LVAD  / Aflutter  Allergies  Allergen Reactions  . Liraglutide Nausea And Vomiting  . Lisinopril Cough    Patient Measurements: Height: 6' (182.9 cm) Weight: 102.1 kg (225 lb 1.4 oz) IBW/kg (Calculated) : 77.6 Heparin Dosing Weight: 101.2 kg  Vital Signs: Temp: 98.1 F (36.7 C) (05/06 0300) Temp Source: Axillary (05/06 0300) BP: 82/62 (05/06 0800) Pulse Rate: 91 (05/06 0800)  Labs: Recent Labs    04/14/20 1622 04/15/20 0417 04/15/20 0445 04/15/20 0445 04/15/20 1700 04/16/20 0450 04/17/20 0043  HGB   < >  --  7.4*   < > 8.4* 8.3*  --   HCT   < >  --  23.4*  --  26.1* 26.0*  --   PLT   < >  --  206  --  226 244  --   LABPROT  --  22.4*  --   --   --  23.0* 25.0*  INR  --  2.1*  --   --   --  2.1* 2.4*  CREATININE  --  4.54*  --   --   --  6.32* 4.34*   < > = values in this interval not displayed.    Estimated Creatinine Clearance: 19.6 mL/min (A) (by C-G formula based on SCr of 4.34 mg/dL (H)).   Medical History: Past Medical History:  Diagnosis Date  . Abdominal pain 01/05/2020  . Abnormal nuclear stress test 01/14/2015  . Acute on chronic combined systolic and diastolic CHF (congestive heart failure) ([place])   . Acute respiratory failure with hypoxia ([place])   . AKI (acute kidney injury) ([place])   . Anxiety   . Arthritis   . Atrial fibrillation ([place]) 11/29/2016  . Atrial fibrillation with RVR ([place])   . Atrial fibrillation, chronic ([place]) 11/29/2016   Last Assessment & Plan:  Formatting of this note might be different from the original. -continue present medications with warfarin anti-coagulation Last Assessment & Plan:  Formatting of this note might be different from the original. -chronic -anti-coagulated -rate controlled  . Automatic implantable cardioverter-defibrillator in situ 08/18/2011   Formatting of this note might be different from the original. Last Assessment & Plan:  Now  followed in ICD clinic. He has followup in January.  Last Assessment & Plan:  Formatting of this note might be different from the original. -historical note  . Benign prostatic hyperplasia   . Bipolar I disorder with depression ([place]) 01/06/2011   Last Assessment & Plan:  -historical note in chart -continue home medications  Last Assessment & Plan:  Formatting of this note might be different from the original. -historical note in chart -continue home medications  . CAD (coronary artery disease)    a.  1993 s/p MI - [employer];  b. s/p BMS to LAD '00;  c. PTCA 2nd diagonal 2010;  d. 02/18/12 Cath: moderate nonobs dzs - med rx;  e.  01/2015 Cath: LM nl, LAD 40-38m ISR, 70-19m/d, d1 90p (3.0x16 Synergy DES), D2 50-60, LCX nl, OM1 50p, 48m (2.5x12 Synergy DES), RCA nl, EF 30-35%.  . Cardiogenic shock ([place]) 03/21/2020  . Chronic anticoagulation 09/22/2018   Last Assessment & Plan:  -continue warfarin for atrial fibrillation with pharmacy to manage  Last Assessment & Plan:  Formatting of this note might be different from the original. -continue warfarin for atrial fibrillation with pharmacy to manage  . Chronic combined systolic and diastolic CHF (congestive heart failure) (  [place])    a. 12/2014 Echo: EF 30-35%, Gr2 DD, mod MR, sev dil LA.  Marland Kitchen Chronic congestive heart failure ([place]) 04/22/2015   Last Assessment & Plan:  Formatting of this note might be different from the original. -known EF 20-25% -continue medical management -appears stable clinically  Last Assessment & Plan:  Formatting of this note might be different from the original. -noted with abnormal EF and chronic activity restrictions -medically manage  . Chronic systolic dysfunction of left ventricle 07/25/2013  . CKD (chronic kidney disease) 09/22/2018  . CKD (chronic kidney disease), stage III   . Coagulopathy ([place])   . COVID-19 04/17/2019   Last Assessment & Plan:  -Apparent scattered infiltrates on CT chest.  -SARS Co V positive -afebrile and not  hypoxic at rest; does qualify for home o2 with sats 88% on room air with activity -home today on self-quarantine x 14 days -finish azithromycin at home -ask Home Health to check on patient Last Assessment & Plan:  -has persistently positive test since initial verification 04/16/19 -contract/d  . Depression   . Diabetic retinopathy associated with type 2 diabetes mellitus ([place]) 01/30/2020  . Diarrhea 01/05/2020  . Dizziness 09/27/2016  . DM (diabetes mellitus), type 2 ([place]) 03/19/2013   Last Assessment & Plan:  Formatting of this note might be different from the original. -hold metformin -reduce insulin with accuchecks and SSI -diet consult -HbA1c  . DNR (do not resuscitate) discussion 03/18/2013  . ED (erectile dysfunction)   . Elevated troponin 09/22/2018   Last Assessment & Plan:  Formatting of this note might be different from the original. -trend -mild elevation not thought to be ischemic on admission from history  . GERD (gastroesophageal reflux disease)   . Hyperlipidemia   . Hypertension   . ICD (implantable cardiac defibrillator) battery depletion 03/18/2013  . IHD (ischemic heart disease) 01/08/2019   Formatting of this note might be different from the original. Last Assessment & Plan:  Minimal symptoms of angina. He is on Ranexa, isosorbide, and a beta blocker. We'll try to maximize his blood pressure therapy. I stressed the importance of regular daily aerobic exercise. He needs to follow a heart healthy/diabetic diet.  . Ischemic cardiomyopathy    a. s/p St. [name] ([place]) ICD implanted in [place] 2007;  b. 12/2014 Echo: Ef 30-35%.  . Ischemic dilated cardiomyopathy ([place]) 03/31/2020  . LVAD (left ventricular assist device) present ([place]) 03/21/2020  . MVP (mitral valve prolapse)    a. s/p MV annuloplasty at [facility] 2004.  . Non-rheumatic mitral regurgitation 01/08/2019   Formatting of this note might be different from the original. Last Assessment & Plan:  He does have a murmur of mitral  insufficiency by exam. We will followup an echocardiogram to assess the success of his mitral valve repair.  . Other chest pain 06/01/2019   Last Assessment & Plan:  Formatting of this note might be different from the original. -known COVID19 with known CAD and cardiomyopathy -trend troponins -consider other etiologies like cardiac involvement with COVID19 -consult DR Gavin Potters -consider echo (although none available until 6/22) -await CT thorax result  . Palliative care by specialist   . Persistent atrial fibrillation ([place])    a. noted on ICD interrogation '10 - not previously on [place] - CHA2DS2VASc = 5.  . Pneumonia, unspecified organism 09/22/2018  . S/P coronary artery stent placement   . Severe episode of recurrent major depressive disorder, without psychotic features ([place]) 02/07/2020  . Shortness of breath   .  Syncope 09/15/2012  . Tick bite 06/02/2019   Last Assessment & Plan:  -reported by ER staff; had tick on umbilicus (removed) -monitor for any signs requiring treatment (I.e. rash, fever)  Last Assessment & Plan:  Formatting of this note might be different from the original. -reported by ER staff; had tick on umbilicus (removed) -monitor for any signs requiring treatment (I.e. rash, fever)  . TSH elevation   . Type II diabetes mellitus (HCC)    uncontrolled  . Unstable angina ([place]) 02/19/2012    Medications:  Medications Prior to Admission  Medication Sig Dispense Refill Last Dose  . ALPRAZolam (XANAX) 0.25 MG tablet Take 0.25 mg by mouth 3 (three) times daily as needed for anxiety.    unknown  . amiodarone (PACERONE) 200 MG tablet Take 200 mg by mouth daily.    03/11/2020 at Unknown time  . apixaban (ELIQUIS) 5 MG TABS tablet Take 1 tablet (5 mg total) by mouth 2 (two) times daily. 60 tablet 1 03/11/2020 at 0600  . atorvastatin (LIPITOR) 40 MG tablet Take 1 tablet (40 mg total) by mouth daily at 6 PM. 30 tablet 0 03/10/2020 at Unknown time  . citalopram (CELEXA) 20 MG tablet Take 20 mg by  mouth daily.    03/11/2020 at Unknown time  . furosemide (LASIX) 40 MG tablet Take 40 mg by mouth daily.    03/11/2020 at Unknown time  . insulin aspart (NOVOLOG FLEXPEN) 100 UNIT/ML FlexPen Inject 6 Units into the skin 3 (three) times daily with meals. 15 mL 0 03/11/2020 at Unknown time  . Insulin Glargine (BASAGLAR KWIKPEN) 100 UNIT/ML SOPN Inject 0.5 mLs (50 Units total) into the skin daily. 15 mL 1 03/11/2020 at Unknown time  . metoprolol succinate (TOPROL-XL) 100 MG 24 hr tablet Take 1 tablet (100 mg total) by mouth daily. 90 tablet 3 03/11/2020 at 0600  . nitroGLYCERIN (NITROSTAT) 0.4 MG SL tablet Place 1 tablet (0.4 mg total) under the tongue every 5 (five) minutes x 3 doses as needed for chest pain. 10 tablet 0 unknown  . pantoprazole (PROTONIX) 40 MG tablet Take 1 tablet (40 mg total) by mouth daily. 30 tablet 1 03/11/2020 at Unknown time  . potassium chloride SA (KLOR-CON) 20 MEQ tablet Take 20 mEq by mouth daily.    03/11/2020 at Unknown time  . tamsulosin (FLOMAX) 0.4 MG CAPS capsule Take 1 capsule (0.4 mg total) by mouth daily. 30 capsule 0 03/11/2020 at Unknown time  . ALPRAZolam (XANAX) 1 MG tablet Take 1 tablet (1 mg total) by mouth 2 (two) times daily as needed for anxiety. (Patient not taking: Reported on 03/03/2020) 10 tablet 0 Not Taking at Unknown time    Assessment: 26 YOM with atrial fibrillation on Eliquis PTA s/p R & L heart cath found to have 3v disease s/p  LVAD  Implant 4/8. Warfarin initiated on 4/9 after discussing with MD.  CT on 4/15 - found to have large right MCA stroke.   Warfarin resumed cautiously on 4/17. Held doses since 4/20 since he went to the OR for VATS. Warfarin was cautiously resumed on 4/23.  INR up to 2.4 today, slightly above reduced INR goal. Hgb 8.3, Pltc with normal limits. LDH 250s. No bleeding has been noted. INR goal reduced 5/4 per HF team.    Goal of Therapy:  INR 1.8-2.3 Monitor platelets by anticoagulation protocol: Yes   Plan:  Hold  warfarin tonight since INR slightly over goal. Daily INR  [place] Crane, Pharm D, BCPS,  [facility] Clinical Pharmacist Phone [phone]  04/17/2020 8:43 AM

## 2020-04-18 LAB — RENAL FUNCTION PANEL
Albumin: 2.3 g/dL — ABNORMAL LOW (ref 3.5–5.0)
Anion gap: 15 (ref 5–15)
BUN: 89 mg/dL — ABNORMAL HIGH (ref 8–23)
CO2: 25 mmol/L (ref 22–32)
Calcium: 8.2 mg/dL — ABNORMAL LOW (ref 8.9–10.3)
Chloride: 95 mmol/L — ABNORMAL LOW (ref 98–111)
Creatinine, Ser: 6.4 mg/dL — ABNORMAL HIGH (ref 0.61–1.24)
GFR calc Af Amer: 9 mL/min — ABNORMAL LOW (ref >60)
GFR calc non Af Amer: 8 mL/min — ABNORMAL LOW (ref >60)
Glucose, Bld: 144 mg/dL — ABNORMAL HIGH (ref 70–99)
Phosphorus: 5.8 mg/dL — ABNORMAL HIGH (ref 2.5–4.6)
Potassium: 4.1 mmol/L (ref 3.5–5.1)
Sodium: 135 mmol/L (ref 135–145)

## 2020-04-18 LAB — PROTIME-INR
INR: 2.1 — ABNORMAL HIGH (ref 0.8–1.2)
Prothrombin Time: 23.2 seconds — ABNORMAL HIGH (ref 11.4–15.2)

## 2020-04-18 LAB — CBC
HCT: 23 % — ABNORMAL LOW (ref 39.0–52.0)
Hemoglobin: 7.2 g/dL — ABNORMAL LOW (ref 13.0–17.0)
MCH: 27.8 pg (ref 26.0–34.0)
MCHC: 31.3 g/dL (ref 30.0–36.0)
MCV: 88.8 fL (ref 80.0–100.0)
Platelets: 261 10*3/uL (ref 150–400)
RBC: 2.59 MIL/uL — ABNORMAL LOW (ref 4.22–5.81)
RDW: 15.6 % — ABNORMAL HIGH (ref 11.5–15.5)
WBC: 7.8 10*3/uL (ref 4.0–10.5)
nRBC: 0 % (ref 0.0–0.2)

## 2020-04-18 LAB — GLUCOSE, CAPILLARY
Glucose-Capillary: 116 mg/dL — ABNORMAL HIGH (ref 70–99)
Glucose-Capillary: 128 mg/dL — ABNORMAL HIGH (ref 70–99)
Glucose-Capillary: 134 mg/dL — ABNORMAL HIGH (ref 70–99)
Glucose-Capillary: 141 mg/dL — ABNORMAL HIGH (ref 70–99)
Glucose-Capillary: 161 mg/dL — ABNORMAL HIGH (ref 70–99)
Glucose-Capillary: 88 mg/dL (ref 70–99)

## 2020-04-18 LAB — LACTATE DEHYDROGENASE: LDH: 225 U/L — ABNORMAL HIGH (ref 98–192)

## 2020-04-18 LAB — MAGNESIUM: Magnesium: 2.5 mg/dL — ABNORMAL HIGH (ref 1.7–2.4)

## 2020-04-18 LAB — PREPARE RBC (CROSSMATCH)

## 2020-04-18 MED ORDER — SODIUM CHLORIDE 0.9% IV SOLUTION: Freq: Once | INTRAVENOUS | Status: DC

## 2020-04-18 MED ORDER — HEPARIN SODIUM (PORCINE) 1000 UNIT/ML IJ SOLN
INTRAMUSCULAR | Status: AC
  Filled 2020-04-18: qty 4

## 2020-04-18 MED ORDER — ALBUMIN HUMAN 25 % IV SOLN
INTRAVENOUS | Status: AC
  Filled 2020-04-18: qty 100

## 2020-04-18 MED ORDER — WARFARIN 0.5 MG HALF TABLET
0.5000 mg | ORAL_TABLET | Freq: Once | ORAL | Status: AC
  Administered 2020-04-18: 0.5 mg via ORAL
  Filled 2020-04-18: qty 1

## 2020-04-18 NOTE — Progress Notes (Signed)
PT Cancellation Note  Patient Details Name: ELLIOT MELDRUM MRN: [id number] DOB: 24-May-1951   Cancelled Treatment:    Reason Eval/Treat Not Completed: Patient at procedure or test/unavailable(HD)   Mort Smelser B Leni Pankonin 04/18/2020, 10:16 AM  Bayard Males, PT Acute Rehabilitation Services Pager: [phone] Office: [phone]

## 2020-04-18 NOTE — Progress Notes (Signed)
LVAD Coordinator Rounding Note:  HM III LVAD implanted on 03/21/2020 with MAZE procedure + LAA clipping  by Dr Orvan Seen under destination therapy criteria due to advanced age. Primary Heart Failure Cardiologist is Dr. Haroldine Laws.   Pt asleep in the bed, difficult to arouse. Nurse states that he gave him some pain meds a short time ago.  Vital signs: Temp: 97.9 HR: 78 - afib Cuff: 103/85 (93) Doppler: not done O2 Sat: 97% on RA Wt: 253.3>262.3>260.8>259.9>237.8>...220.4>219.5>218.2>222.4>229>210>225>233.6 lbs   LVAD interrogation reveals:  Speed: 5300 Flow: 3.9 Power: 3.6w  PI: 4.8  Alarms: none  Events: rare Hematocrit: 23  Fixed speed: 5300 Low speed limit: 5000   Drive Line:CDI. [place] secure. Dressing changes twice weekly (Monday / Thursday) dressing changes per nurse champion or VAD coordinator.  Next dressing change due 04/21/20.   Labs:  LDH trend: 360>513>482>...283>273>259>243>262>271>241>253>224>225  INR trend: 1.3>6.2>9>4.4>6.2>2.2....2.8>3.1>2.4>2.3>2.5>2.1>2.1>2.4>2.1  CR: 3.57>3.98>3.18>2.44...>4.3>3.6>2.53>2.25>2.16>1.88>1.83>1.88>2.3>5.52>4.54>6.3>4.34>6.4  Anticoagulation Plan: -INR Goal: 1.8 - 2.3  -ASA Dose: 81 mg - per protocol due to stop tomorrow will d/w Dr. Haroldine Laws  Respiratory: - extubated 4/9/2 - re-intubated 03/23/2020 s/p left hemothorax with cardiac arrest - extubated 04/06/20  Renal:  - CVVH started 03/25/20 - Intermittent HD dialysis started 04/14/20 - 04/16/20 Tunneled HD cath placed  Neuro: - Right MCA CVA on 03/27/20   Nitric Oxide: off 03/23/20  Blood Products:  Intraop: 6 FFP  DDAVP  420 cell saver  03/26/2020>>4 FFP 03/21/20>> 1 RBC 03/22/20>>1 RBC  Intra-op: 03/19/2020>>2 PCs      2 FFP Post-op: 03/28/2020>>3 PC's, 2 FFP 04/14/20>>1 PC's 04/15/20>> 1 PC's 04/18/20>>1 PC's  Cultures:  -03/25/20- blood cultures>>negative - 03/16/2020-blood cultures>>negative - 04/03/20 - respiratory culture>>neg   Device: -St [name] -Therapies: VF 194 -  therapy on  Drips: - Milrinone 0.125 mcg/kg/min >> off 04/09/20 - Levo 1 mcg/min>> off 04/10/20 - Amiodarone 30 mg/hr-off - Tube feed: 50 mL/hr   Adverse Events on VAD: >>03/25/20 CVVH Started >>03/27/2020 left hemothorax; cardiac arrest >>04/14/20 HD  Pt Education:  1. Pt not appropriate for teaching at this time.  2. No family at bedside.  Plan/Recommendations:  1. Call VAD coordinator for any equipment or drive line issues.  2. Dressing changes Monday / Thursday per nurse champion or VAD coordinator.   Tanda Rockers RN [place] Coordinator  Office: [phone]  24/7 Pager: [phone]

## 2020-04-18 NOTE — Progress Notes (Signed)
ANTICOAGULATION CONSULT NOTE  Pharmacy Consult for warfarin Indication: post op LVAD  / Aflutter  Allergies  Allergen Reactions  . Liraglutide Nausea And Vomiting  . Lisinopril Cough    Patient Measurements: Height: 6' (182.9 cm) Weight: 106 kg (233 lb 11 oz) IBW/kg (Calculated) : 77.6 Heparin Dosing Weight: 101.2 kg  Vital Signs: Temp: 98.8 F (37.1 C) (05/07 0814) Temp Source: Axillary (05/07 0730) BP: 94/80 (05/07 1045) Pulse Rate: (P) 77 (05/07 1100)  Labs: Recent Labs    04/16/20 0450 04/16/20 0450 04/17/20 0043 04/17/20 1254 04/18/20 0259  HGB 8.3*   < >  --  7.4* 7.2*  HCT 26.0*  --   --  23.5* 23.0*  PLT 244  --   --  252 261  LABPROT 23.0*  --  25.0*  --  23.2*  INR 2.1*  --  2.4*  --  2.1*  CREATININE 6.32*  --  4.34*  --  6.40*   < > = values in this interval not displayed.    Estimated Creatinine Clearance: 13.5 mL/min (A) (by C-G formula based on SCr of 6.4 mg/dL (H)).   Medical History: Past Medical History:  Diagnosis Date  . Abdominal pain 01/05/2020  . Abnormal nuclear stress test 01/14/2015  . Acute on chronic combined systolic and diastolic CHF (congestive heart failure) ([place])   . Acute respiratory failure with hypoxia ([place])   . AKI (acute kidney injury) ([place])   . Anxiety   . Arthritis   . Atrial fibrillation ([place]) 11/29/2016  . Atrial fibrillation with RVR ([place])   . Atrial fibrillation, chronic ([place]) 11/29/2016   Last Assessment & Plan:  Formatting of this note might be different from the original. -continue present medications with warfarin anti-coagulation Last Assessment & Plan:  Formatting of this note might be different from the original. -chronic -anti-coagulated -rate controlled  . Automatic implantable cardioverter-defibrillator in situ 08/18/2011   Formatting of this note might be different from the original. Last Assessment & Plan:  Now followed in ICD clinic. He has followup in January.  Last Assessment & Plan:  Formatting of this  note might be different from the original. -historical note  . Benign prostatic hyperplasia   . Bipolar I disorder with depression ([place]) 01/06/2011   Last Assessment & Plan:  -historical note in chart -continue home medications  Last Assessment & Plan:  Formatting of this note might be different from the original. -historical note in chart -continue home medications  . CAD (coronary artery disease)    a.  1993 s/p MI - [employer] [facility];  b. s/p BMS to LAD '00;  c. PTCA 2nd diagonal 2010;  d. 02/18/12 Cath: moderate nonobs dzs - med rx;  e.  01/2015 Cath: LM nl, LAD 40-74m ISR, 70-25m/d, d1 90p (3.0x16 Synergy DES), D2 50-60, LCX nl, OM1 50p, 74m (2.5x12 Synergy DES), RCA nl, EF 30-35%.  . Cardiogenic shock ([place]) 03/21/2020  . Chronic anticoagulation 09/22/2018   Last Assessment & Plan:  -continue warfarin for atrial fibrillation with pharmacy to manage  Last Assessment & Plan:  Formatting of this note might be different from the original. -continue warfarin for atrial fibrillation with pharmacy to manage  . Chronic combined systolic and diastolic CHF (congestive heart failure) ([place])    a. 12/2014 Echo: EF 30-35%, Gr2 DD, mod MR, sev dil LA.  Marland Kitchen Chronic congestive heart failure ([place]) 04/22/2015   Last Assessment & Plan:  Formatting of this note might be different from the original. -known  EF 20-25% -continue medical management -appears stable clinically  Last Assessment & Plan:  Formatting of this note might be different from the original. -noted with abnormal EF and chronic activity restrictions -medically manage  . Chronic systolic dysfunction of left ventricle 07/25/2013  . CKD (chronic kidney disease) 09/22/2018  . CKD (chronic kidney disease), stage III   . Coagulopathy ([place])   . COVID-19 04/17/2019   Last Assessment & Plan:  -Apparent scattered infiltrates on CT chest.  -SARS Co V positive -afebrile and not hypoxic at rest; does qualify for home o2 with sats 88% on room air with activity -home today on  self-quarantine x 14 days -finish azithromycin at home -ask Home Health to check on patient Last Assessment & Plan:  -has persistently positive test since initial verification 04/16/19 -contract/d  . Depression   . Diabetic retinopathy associated with type 2 diabetes mellitus ([place]) 01/30/2020  . Diarrhea 01/05/2020  . Dizziness 09/27/2016  . DM (diabetes mellitus), type 2 ([place]) 03/19/2013   Last Assessment & Plan:  Formatting of this note might be different from the original. -hold metformin -reduce insulin with accuchecks and SSI -diet consult -HbA1c  . DNR (do not resuscitate) discussion 03/18/2013  . ED (erectile dysfunction)   . Elevated troponin 09/22/2018   Last Assessment & Plan:  Formatting of this note might be different from the original. -trend -mild elevation not thought to be ischemic on admission from history  . GERD (gastroesophageal reflux disease)   . Hyperlipidemia   . Hypertension   . ICD (implantable cardiac defibrillator) battery depletion 03/18/2013  . IHD (ischemic heart disease) 01/08/2019   Formatting of this note might be different from the original. Last Assessment & Plan:  Minimal symptoms of angina. He is on Ranexa, isosorbide, and a beta blocker. We'll try to maximize his blood pressure therapy. I stressed the importance of regular daily aerobic exercise. He needs to follow a heart healthy/diabetic diet.  . Ischemic cardiomyopathy    a. s/p [name] ([employer]) ICD implanted in [place] 2007;  b. 12/2014 Echo: Ef 30-35%.  . Ischemic dilated cardiomyopathy ([place]) 03/30/2020  . LVAD (left ventricular assist device) present ([place]) 03/21/2020  . MVP (mitral valve prolapse)    a. s/p MV annuloplasty at [facility] 2004.  . Non-rheumatic mitral regurgitation 01/08/2019   Formatting of this note might be different from the original. Last Assessment & Plan:  He does have a murmur of mitral insufficiency by exam. We will followup an echocardiogram to assess the success of his mitral valve  repair.  . Other chest pain 06/01/2019   Last Assessment & Plan:  Formatting of this note might be different from the original. -known COVID19 with known CAD and cardiomyopathy -trend troponins -consider other etiologies like cardiac involvement with COVID19 -consult DR Gavin Potters -consider echo (although none available until 6/22) -await CT thorax result  . Palliative care by specialist   . Persistent atrial fibrillation ([place])    a. noted on ICD interrogation '10 - not previously on [place] - CHA2DS2VASc = 5.  . Pneumonia, unspecified organism 09/22/2018  . S/P coronary artery stent placement   . Severe episode of recurrent major depressive disorder, without psychotic features ([place]) 02/07/2020  . Shortness of breath   . Syncope 09/15/2012  . Tick bite 06/02/2019   Last Assessment & Plan:  -reported by ER staff; had tick on umbilicus (removed) -monitor for any signs requiring treatment (I.e. rash, fever)  Last Assessment & Plan:  Formatting of this  note might be different from the original. -reported by ER staff; had tick on umbilicus (removed) -monitor for any signs requiring treatment (I.e. rash, fever)  . TSH elevation   . Type II diabetes mellitus ([facility])    uncontrolled  . Unstable angina ([place]) 02/19/2012    Medications:  Medications Prior to Admission  Medication Sig Dispense Refill Last Dose  . ALPRAZolam (XANAX) 0.25 MG tablet Take 0.25 mg by mouth 3 (three) times daily as needed for anxiety.    unknown  . amiodarone (PACERONE) 200 MG tablet Take 200 mg by mouth daily.    03/11/2020 at Unknown time  . apixaban (ELIQUIS) 5 MG TABS tablet Take 1 tablet (5 mg total) by mouth 2 (two) times daily. 60 tablet 1 03/11/2020 at 0600  . atorvastatin (LIPITOR) 40 MG tablet Take 1 tablet (40 mg total) by mouth daily at 6 PM. 30 tablet 0 03/10/2020 at Unknown time  . citalopram (CELEXA) 20 MG tablet Take 20 mg by mouth daily.    03/11/2020 at Unknown time  . furosemide (LASIX) 40 MG tablet Take 40 mg by mouth  daily.    03/11/2020 at Unknown time  . insulin aspart (NOVOLOG FLEXPEN) 100 UNIT/ML FlexPen Inject 6 Units into the skin 3 (three) times daily with meals. 15 mL 0 03/11/2020 at Unknown time  . Insulin Glargine (BASAGLAR KWIKPEN) 100 UNIT/ML SOPN Inject 0.5 mLs (50 Units total) into the skin daily. 15 mL 1 03/11/2020 at Unknown time  . metoprolol succinate (TOPROL-XL) 100 MG 24 hr tablet Take 1 tablet (100 mg total) by mouth daily. 90 tablet 3 03/11/2020 at 0600  . nitroGLYCERIN (NITROSTAT) 0.4 MG SL tablet Place 1 tablet (0.4 mg total) under the tongue every 5 (five) minutes x 3 doses as needed for chest pain. 10 tablet 0 unknown  . pantoprazole (PROTONIX) 40 MG tablet Take 1 tablet (40 mg total) by mouth daily. 30 tablet 1 03/11/2020 at Unknown time  . potassium chloride SA (KLOR-CON) 20 MEQ tablet Take 20 mEq by mouth daily.    03/11/2020 at Unknown time  . tamsulosin (FLOMAX) 0.4 MG CAPS capsule Take 1 capsule (0.4 mg total) by mouth daily. 30 capsule 0 03/11/2020 at Unknown time  . ALPRAZolam (XANAX) 1 MG tablet Take 1 tablet (1 mg total) by mouth 2 (two) times daily as needed for anxiety. (Patient not taking: Reported on 02/15/2020) 10 tablet 0 Not Taking at Unknown time    Assessment: 52 YOM with atrial fibrillation on Eliquis PTA s/p R & L heart cath found to have 3v disease s/p  LVAD  Implant 4/8. Warfarin initiated on 4/9 after discussing with MD.  CT on 4/15 - found to have large right MCA stroke.   Warfarin resumed cautiously on 4/17. Held doses since 4/20 since he went to the OR for VATS. Warfarin was cautiously resumed on 4/23.  INR 2.1 - within goal range. Hgb 8.3 > 7.2, Pltc with normal limits. LDH 250s. No bleeding has been noted. INR goal reduced 5/4 per HF team.    Goal of Therapy:  INR 1.8-2.3 Monitor platelets by anticoagulation protocol: Yes   Plan:  Warfarin 0.5 mg x 1 tonight Daily PT/INR.  Marguerite Olea, [facility] Clinical Pharmacist Phone [phone]   04/18/2020 11:06 AM

## 2020-04-18 NOTE — Procedures (Signed)
I was present at this dialysis session. I have reviewed the session itself and made appropriate changes.   Vital signs in last 24 hours:  Temp:  [96.7 F (35.9 C)-98.8 F (37.1 C)] 98.8 F (37.1 C) (05/07 0814) Pulse Rate:  [62-144] 75 (05/07 0900) Resp:  [9-28] 21 (05/07 0800) BP: (85-145)/(54-111) 94/82 (05/07 0900) SpO2:  [89 %-100 %] 96 % (05/07 0800) Weight:  [103.8 kg-106 kg] 106 kg (05/07 0730) Weight change: 8.5 kg Filed Weights   04/16/20 1140 04/18/20 0430 04/18/20 0730  Weight: 102.1 kg 103.8 kg 106 kg    Recent Labs  Lab 04/18/20 0259  NA 135  K 4.1  CL 95*  CO2 25  GLUCOSE 144*  BUN 89*  CREATININE 6.40*  CALCIUM 8.2*  PHOS 5.8*    Recent Labs  Lab 04/16/20 0450 04/17/20 1254 04/18/20 0259  WBC 9.0 8.8 7.8  HGB 8.3* 7.4* 7.2*  HCT 26.0* 23.5* 23.0*  MCV 89.7 89.7 88.8  PLT 244 252 261    Scheduled Meds: . sodium chloride   Intravenous Once  . aspirin  81 mg Per Tube Daily  . atorvastatin  80 mg Per Tube q1800  . B-complex with vitamin C  1 tablet Per Tube Daily  . bisacodyl  10 mg Oral Daily   Or  . bisacodyl  10 mg Rectal Daily  . Chlorhexidine Gluconate Cloth  6 each Topical Daily  . citalopram  10 mg Per Tube Daily  . darbepoetin (ARANESP) injection - NON-DIALYSIS  100 mcg Subcutaneous Q Tue-1800  . feeding supplement (NEPRO CARB STEADY)  237 mL Oral BID BM  . feeding supplement (PRO-STAT SUGAR FREE 64)  30 mL Per Tube BID  . heparin sodium (porcine)      . influenza vaccine adjuvanted  0.5 mL Intramuscular Tomorrow-1000  . insulin aspart  0-24 Units Subcutaneous Q4H  . insulin aspart  6 Units Subcutaneous Q4H  . insulin glargine  24 Units Subcutaneous BID  . midodrine  5 mg Per Tube TID WC  . pantoprazole (PROTONIX) IV  40 mg Intravenous Daily  . sodium chloride flush  10-40 mL Intracatheter Q12H  . sodium zirconium cyclosilicate  10 g Oral Daily  . Warfarin - Pharmacist Dosing Inpatient   Does not apply q1600   Continuous  Infusions: . sodium chloride Stopped (04/15/20 1731)  . albumin human    . feeding supplement (NEPRO CARB STEADY) 50 mL/hr at 04/17/20 2150  . norepinephrine (LEVOPHED) Adult infusion Stopped (04/11/20 2215)   PRN Meds:.sodium chloride, acetaminophen, dextrose, hydrALAZINE, levalbuterol, ondansetron (ZOFRAN) IV, oxyCODONE, sodium chloride flush, sorbitol, traMADol    Assessment/Plan:  1. AKI, in setting of cardiogenic shock, pressors, s/p LVAD 04/05/2020. CRRT initiated on 03/25/20 and stopped 04/12/20 since he is off of pressors.Tolerated hisfirst intermittent HD 04/14/20 1. Tolerated HD 04/16/20.  Tolerating HD now with SBP of 90 2. No heparin with HD on systemic anticoagulation 2. Hyperkalemia-improved with HD 3. Vascular access- temp cath removed and RIJ TDC placed 04/16/20 by IR. 4. sCHF s/p LVAD 03/24/2020 5. A fib s/p MAZE and LAA clipping 4/8 6. R MCA ischemic stroke- large, seen on CT on 03/27/20. 7. Left hemothorax s/p VATS 03/31/2020. 8. Anemia-s/p transfusion 04/14/20 without significant improvement of Hgb. Receiving blood again today. Continue to follow.  9. MVP s/p MV repair  [name] Potts,  MD 04/18/2020, 9:13 AM

## 2020-04-18 NOTE — Progress Notes (Addendum)
Patient ID: [name] [name], male   DOB: February 23, 1951, 69 y.o.   MRN: [id number]    Advanced Heart Failure Rounding Note   Subjective:    - S/p HM3 VAD 4/8 w MAZE procedure + LAA clipping. - Extubated 4/9 - 4/12 LVAD speed increased to 5400 --> echo moderate-severe RV dysfunction, septum mildly left shifted, trivial pericardial effusion.  LVAD speed cut back to 5300.  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  - 4/20 developed hemothorax requiring emergent CT placement and eventual VATS - 4/21 EEG done given concerns for posturing and c/w moderate to severe diffuse encephalopathy. No seizures or epileptiform discharges were seen  - 04/03/20 Pump Stop --> controller change out. CT of head unchanged R MCA CVA - 04/06/20 Extubated - 5/1 CRRT stopped. Had first iHD 5/3.   Received blood on 5/4 for hgb 7.4-->8.4 . Hgb trending down to 7.2.   Yesterday OOB to chair with 2 person assist.   Drowsy. Follows commands. Denies pain.    VAD Interrogation  Flow 3.7 Speed 5300, Power 4  Pulse Index 5.3  No PI events over night.  VAD interrogated personally. Parameters stable.   Objective:   Weight Range:  Vital Signs:   Temp:  [97.9 F (36.6 C)-98.5 F (36.9 C)] 98.1 F (36.7 C) (05/07 0400) Pulse Rate:  [62-144] 70 (05/07 0700) Resp:  [9-28] 11 (05/07 0700) BP: (82-122)/(62-111) 110/93 (05/07 0700) SpO2:  [89 %-100 %] 97 % (05/07 0700) Weight:  [103.8 kg] 103.8 kg (05/07 0430) Last BM Date: 04/17/20  Weight change: Filed Weights   04/16/20 0745 04/16/20 1140 04/18/20 0430  Weight: 95.3 kg 102.1 kg 103.8 kg    Intake/Output:   Intake/Output Summary (Last 24 hours) at 04/18/2020 0708 Last data filed at 04/18/2020 0700 Gross per 24 hour  Intake 1350.83 ml  Output --  Net 1350.83 ml     Physical Exam: GENERAL: In bed. No acute distress. HEENT: Cortrak in place NECK: Supple, JVP 6-7  .  2+ bilaterally, no bruits.  No lymphadenopathy or thyromegaly appreciated.    CARDIAC:  Mechanical heart sounds with LVAD hum present.  R chest tunneled HD catheter LUNGS:  Clear to auscultation bilaterally.  ABDOMEN:  Soft, round, nontender, positive bowel sounds x4.     LVAD exit site: well-healed and incorporated.  Dressing dry and intact.  No erythema or drainage.  Stabilization device present and accurately applied.  Driveline dressing is being changed daily per sterile technique. EXTREMITIES:  Warm and dry, no cyanosis, clubbing, rash or edema  NEUROLOGIC:  Drowsy but follows commands. MAE x4. No aphasia.  No dysarthria.  Affect flat.     Telemetry: A fib 80s     Labs: Basic Metabolic Panel: Recent Labs  Lab 04/14/20 0440 04/14/20 0440 04/15/20 0417 04/15/20 0417 04/16/20 0450 04/17/20 0043 04/18/20 0259  NA 136  --  137  --  134* 133* 135  K 6.1*  --  4.4  --  5.0 4.0 4.1  CL 101  --  96*  --  96* 95* 95*  CO2 25  --  27  --  25 24 25   GLUCOSE 109*  --  71  --  76 191* 144*  BUN 96*  --  71*  --  97* 57* 89*  CREATININE 5.52*  --  4.54*  --  6.32* 4.34* 6.40*  CALCIUM 8.2*   < > 8.1*   < > 8.2* 8.0* 8.2*  MG 2.9*  --  2.6*  --  2.7* 2.2 2.5*  PHOS 4.2  --  5.2*  --  6.9* 5.9* 5.8*   < > = values in this interval not displayed.    Liver Function Tests: Recent Labs  Lab 04/14/20 0440 04/15/20 0417 04/16/20 0450 04/17/20 0043 04/18/20 0259  ALBUMIN 2.1* 1.9* 2.0* 2.4* 2.3*   No results for input(s): LIPASE, AMYLASE in the last 168 hours. No results for input(s): AMMONIA in the last 168 hours.  CBC: Recent Labs  Lab 04/15/20 0445 04/15/20 1700 04/16/20 0450 04/17/20 1254 04/18/20 0259  WBC 9.3 8.4 9.0 8.8 7.8  HGB 7.4* 8.4* 8.3* 7.4* 7.2*  HCT 23.4* 26.1* 26.0* 23.5* 23.0*  MCV 91.1 89.1 89.7 89.7 88.8  PLT 206 226 244 252 261    Cardiac Enzymes: No results for input(s): CKTOTAL, CKMB, CKMBINDEX, TROPONINI in the last 168 hours.  BNP: BNP (last 3 results) Recent Labs    04/02/20 2342 04/10/20 0132 04/17/20 0043  BNP  332.7* 267.2* 582.1*    ProBNP (last 3 results) No results for input(s): PROBNP in the last 8760 hours.    Other results:  Imaging: IR Fluoro Guide CV Line Right  Result Date: 04/16/2020 CLINICAL DATA:  Renal insufficiency, needs durable venous access for hemodialysis. EXAM: TUNNELED HEMODIALYSIS CATHETER PLACEMENT WITH ULTRASOUND AND FLUOROSCOPIC GUIDANCE TECHNIQUE: The procedure, risks, benefits, and alternatives were explained to the patient. Questions regarding the procedure were encouraged and answered. The patient understands and consents to the procedure. Patency of the right IJ vein was confirmed with ultrasound with image documentation. An appropriate skin site was determined. Region was prepped using maximum barrier technique including cap and mask, sterile gown, sterile gloves, large sterile sheet, and Chlorhexidine as cutaneous antisepsis. The region was infiltrated locally with 1% lidocaine. Intravenous Fentanyl 82mcg and Versed 0.5mg  were administered as conscious sedation during continuous monitoring of the patient's level of consciousness and physiological / cardiorespiratory status by the radiology RN, with a total moderate sedation time of 10 minutes. Under real-time ultrasound guidance, the right IJ vein was accessed with a 21 gauge micropuncture needle; the needle tip within the vein was confirmed with ultrasound image documentation. Needle exchanged over the 018 guidewire for transitional dilator, which allowed advancement of a Benson wire into the IVC. Over this, an MPA catheter was advanced. A Palindrome 19 hemodialysis catheter was tunneled from the right anterior chest wall approach to the right IJ dermatotomy site. The MPA catheter was exchanged over an Amplatz wire for serial vascular dilators which allow placement of a peel-away sheath, through which the catheter was advanced under intermittent fluoroscopy, positioned with its tips in the proximal and midright atrium. Spot  chest radiograph confirms good catheter position. No pneumothorax. Catheter was flushed and primed per protocol. Catheter secured externally with O Prolene sutures. The right IJ dermatotomy site was closed with Dermabond. COMPLICATIONS: COMPLICATIONS None immediate FLUOROSCOPY TIME:  0.6 minute; 144  uGym2 DAP COMPARISON:  None IMPRESSION: 1. Technically successful placement of tunneled right IJ hemodialysis catheter with ultrasound and fluoroscopic guidance. Ready for routine use. ACCESS: Remains approachable for percutaneous intervention as needed. Electronically Signed   By: Lucrezia Europe M.D.   On: 04/16/2020 14:59   IR US Guide Vasc Access Right  Result Date: 04/17/2020 Study Result CLINICAL DATA:  Renal insufficiency, needs durable venous access for hemodialysis.  EXAM: TUNNELED HEMODIALYSIS CATHETER PLACEMENT WITH ULTRASOUND AND FLUOROSCOPIC GUIDANCE  TECHNIQUE: The procedure, risks, benefits, and alternatives were explained to  the patient. Questions regarding the procedure were encouraged and answered. The patient understands and consents to the procedure.  Patency of the right IJ vein was confirmed with ultrasound with image documentation. An appropriate skin site was determined. Region was prepped using maximum barrier technique including cap and mask, sterile gown, sterile gloves, large sterile sheet, and Chlorhexidine as cutaneous antisepsis. The region was infiltrated locally with 1% lidocaine.  Intravenous Fentanyl 28mcg and Versed 0.5mg  were administered as conscious sedation during continuous monitoring of the patient's level of consciousness and physiological / cardiorespiratory status by the radiology RN, with a total moderate sedation time of 10 minutes. Under real-time ultrasound guidance, the right IJ vein was accessed with a 21 gauge micropuncture needle; the needle tip within the vein was confirmed with ultrasound image documentation. Needle exchanged over the 018 guidewire for  transitional dilator, which allowed advancement of a Benson wire into the IVC. Over this, an MPA catheter was advanced. A Palindrome 19 hemodialysis catheter was tunneled from the right anterior chest wall approach to the right IJ dermatotomy site. The MPA catheter was exchanged over an Amplatz wire for serial vascular dilators which allow placement of a peel-away sheath, through which the catheter was advanced under intermittent fluoroscopy, positioned with its tips in the proximal and midright atrium. Spot chest radiograph confirms good catheter position. No pneumothorax. Catheter was flushed and primed per protocol. Catheter secured externally with O Prolene sutures. The right IJ dermatotomy site was closed with Dermabond.  COMPLICATIONS: COMPLICATIONS None immediate  FLUOROSCOPY TIME:  0.6 minute; 144  uGym2 DAP  COMPARISON:  None  IMPRESSION: 1. Technically successful placement of tunneled right IJ hemodialysis catheter with ultrasound and fluoroscopic guidance. Ready for routine use.  ACCESS: Remains approachable for percutaneous intervention as needed.   Electronically Signed   By: Lucrezia Europe M.D.   On: 04/16/2020 14:59      Medications:     Scheduled Medications: . aspirin  81 mg Per Tube Daily  . atorvastatin  80 mg Per Tube q1800  . B-complex with vitamin C  1 tablet Per Tube Daily  . bisacodyl  10 mg Oral Daily   Or  . bisacodyl  10 mg Rectal Daily  . Chlorhexidine Gluconate Cloth  6 each Topical Daily  . citalopram  10 mg Per Tube Daily  . darbepoetin (ARANESP) injection - NON-DIALYSIS  100 mcg Subcutaneous Q Tue-1800  . feeding supplement (NEPRO CARB STEADY)  237 mL Oral BID BM  . feeding supplement (PRO-STAT SUGAR FREE 64)  30 mL Per Tube BID  . influenza vaccine adjuvanted  0.5 mL Intramuscular Tomorrow-1000  . insulin aspart  0-24 Units Subcutaneous Q4H  . insulin aspart  6 Units Subcutaneous Q4H  . insulin glargine  24 Units Subcutaneous BID  . midodrine  5 mg Per Tube  TID WC  . pantoprazole (PROTONIX) IV  40 mg Intravenous Daily  . sodium chloride flush  10-40 mL Intracatheter Q12H  . sodium zirconium cyclosilicate  10 g Oral Daily  . Warfarin - Pharmacist Dosing Inpatient   Does not apply q1600    Infusions: . sodium chloride Stopped (04/15/20 1731)  . albumin human    . feeding supplement (NEPRO CARB STEADY) 50 mL/hr at 04/17/20 2150  . norepinephrine (LEVOPHED) Adult infusion Stopped (04/11/20 2215)    PRN Medications: sodium chloride, acetaminophen, dextrose, hydrALAZINE, levalbuterol, ondansetron (ZOFRAN) IV, oxyCODONE, sodium chloride flush, sorbitol, traMADol   Assessment/Plan:   1. Acute on chronic systolic HF -> cardiogenic shock->  S/p HM3 LVAD - Echo 2016 EF 30-35% - Echo 1/21 EF 25% - Admitted with NYHA IV symptoms and AKI with attempts at diuresis. Initial co-ox 39% - Echo this admit: EF 10-15% moderate RV dysfunction - R/LHC cath 3/21 with severe 3v CAD and low output with CI 1.7.  - Initially planned for CABG but given need for re-do sternotomy, relatively poor targets and longstanding low EF, VAD felt to be better option  - S/p HM3 VAD 4/8 - Speed turned up on 4/11 to  5400, but looking at 4/10 echo there is significant RV dysfunction with leftward septal shift so speed decreased back to 5300 rpm.  - Stable off drips.  -  On Midodrine 5 tid. MAP 90-100s. May be able to cut midodrine if Maps remain high.   - Now off CVVH. Now on iHD - Volume status ok.  Continue HD per nephrology   2. VAD - VAD interrogated personally. Parameters stable. - Had Pump Stop 04/03/20. Controller changed out with old controller sent back to [facility] for evaluation.  - on aspirin 81 mg daily + coumadin.  - LDH 225 - INR 2.1 . We have lowered INR goal to 1.8-2.3 given anemia. Discussed dosing with PharmD personally.   3. CAD with unstable angina - s/p previous PCI. - cath 02/16/2020 with severe 3v CAD - now s/p VAD on 03/19/2020   - No chest pain.    4. AKI on CKD 3a - Nephrology consulted. Started CVVHD 4/13.    - Renal US 4/5 unremarkable.  - Hopefully renal function will recover but Renal team not optimistic - He is now off NE w/ stable MAP.  - Off CVVH.  1st iHD 5/3.  - continue HD per nephrology. Monitor for uremic symptoms  -  Tunnel HD cath placed 5/5. Appreciate IR  5. Permanent AF - now s/p MAZE + LAA Clipping 4/8   - Rate controlled.  - On coumadin. INR 2.1   6. MVP s/p MV repair - On admit with recurrent severe MR on echo and with huge v-waves on PCWP tracing - s/p VAD  7. Hemothoax - s/p CT placement and VATs on 4/22 - CTs pulled 4/26 - stable  8. ID/leukocytosis - 4/11 procalcitonin 1.96  - Started empiric coverage for PNA with vancomycin/cefepime for 7 days. Stopped 4/17 - Given recent VATs and high-dose pressor requirements he was treated with broad spectrum abx, linezolid and cefepime (completed course 4/28)  - WBC normal.   9. Acute Hypoxic Respiratory Failure - Re-intubated 4/20 with hemothorax- - Extubated 4/25 - Remains stable on [place], sats stable.   10. Malnutrition  - Albumin 2.6  - Remains on tube feeds.  - Speech following--> placed on thin liquid dysphagia 1.   11. CVA/anoxic brain injury - 03/27/20 CT large posterior MCA ischemic infarct, possibly from atrial fibrillation.  INR was supratherapeutic when CVA found.  Stable LDH, do not think partial pump thrombosis is the culprit. - Still with some L weakness and inattention but improving  - PT/OT/speech following.  - CIR consulted.     12. Anemia, post-op  - Received 2u RBC 04/05/20 + 1 Unit on 5/3 + 1 unit 5/4 - Hgb 7.2 today. No obvious source of bleeding.  - Transfuse if hemoglobin < 7.5  - INR 2.1.  - lower INR goal to 1.8-2.3  13. Hyperkalemia - resolved  - K stable.  - Continue iHD  - can use Lokelma PRN on non HD days if needed  14. DM2, poorly controlled - hgbA1c 11.1% - continue insulin   15. Agitation/  Depression  - Celexa 10 mg daily added. Mood better today   - Monitor   16. Deconditioning - Continue PT/OT    Length of Stay: [place], NP  7:08 AM  04/18/2020   Patient seen and examined with the above-signed Advanced Practice Provider and/or Housestaff. I personally reviewed laboratory data, imaging studies and relevant notes. I independently examined the patient and formulated the important aspects of the plan. I have edited the note to reflect any of my changes or salient points. I have personally discussed the plan with the patient and/or family.  A bit drowsy this am but that is normal for him in the early mornings. Will respond to commands. Currently on iHD. Tolerating well. MAPs ok. Denies SOB. Still quite weak. CIR has seen him. Hgb down this am. No evidence of overt bleeding. INR 2.1  General:  NAD. Sleepy but follows comamnds HEENT: normal  Neck: supple. JVP not elevated.  Carotids 2+ bilat; no bruits. No lymphadenopathy or thryomegaly appreciated. Cor: LVAD hum.  + tunneled cath on left Lungs: Clear. Decreased left base Abdomen: obese soft, nontender, non-distended. No hepatosplenomegaly. No bruits or masses. Good bowel sounds. Driveline site clean. Anchor in place.  Extremities: no cyanosis, clubbing, rash. Warm no edema  Neuro: alert & oriented x 3.  Sleepy but follows commands weak  Remains anuric but tolerating iHD. MAPs stable. Hgb down a bit. Will transfuse 1u RBCs. No evidence of bleeding. ? Need for aranesp.   He is weak and lethargic. Possible component of uremia. Hopefully can be cleared well today.   Continue to work with PT/oT with goal of getting to CIR. VAD interrogated personally. Parameters stable. AC discussed with pharmd   Glori Bickers, MD  9:24 AM

## 2020-04-18 NOTE — Progress Notes (Signed)
SLP Cancellation Note  Patient Details Name: [name] [name] MRN: [id number] DOB: 03-31-1951   Cancelled treatment:       Reason Eval/Treat Not Completed: Patient declined, no reason specified. Pt was approached for treatment but RN reported that the pt has been more lethargic today and pt's verbal output was limited. Pt requested that p.o. intake be deferred. SLP will follow up on sequent date for treatment.  Giulietta Prokop I. Hardin Negus, [place], [place] Office number [phone] Pager [phone]  Horton Marshall 04/18/2020, 5:04 PM

## 2020-04-19 ENCOUNTER — Inpatient Hospital Stay (HOSPITAL_COMMUNITY): Payer: Medicare HMO

## 2020-04-19 DIAGNOSIS — R531 Weakness: Secondary | ICD-10-CM

## 2020-04-19 LAB — RENAL FUNCTION PANEL
Albumin: 2.3 g/dL — ABNORMAL LOW (ref 3.5–5.0)
Anion gap: 15 (ref 5–15)
BUN: 53 mg/dL — ABNORMAL HIGH (ref 8–23)
CO2: 26 mmol/L (ref 22–32)
Calcium: 8.2 mg/dL — ABNORMAL LOW (ref 8.9–10.3)
Chloride: 94 mmol/L — ABNORMAL LOW (ref 98–111)
Creatinine, Ser: 4.37 mg/dL — ABNORMAL HIGH (ref 0.61–1.24)
GFR calc Af Amer: 15 mL/min — ABNORMAL LOW (ref >60)
GFR calc non Af Amer: 13 mL/min — ABNORMAL LOW (ref >60)
Glucose, Bld: 75 mg/dL (ref 70–99)
Phosphorus: 5.1 mg/dL — ABNORMAL HIGH (ref 2.5–4.6)
Potassium: 3.9 mmol/L (ref 3.5–5.1)
Sodium: 135 mmol/L (ref 135–145)

## 2020-04-19 LAB — GLUCOSE, CAPILLARY
Glucose-Capillary: 53 mg/dL — ABNORMAL LOW (ref 70–99)
Glucose-Capillary: 156 mg/dL — ABNORMAL HIGH (ref 70–99)
Glucose-Capillary: 142 mg/dL — ABNORMAL HIGH (ref 70–99)
Glucose-Capillary: 147 mg/dL — ABNORMAL HIGH (ref 70–99)
Glucose-Capillary: 160 mg/dL — ABNORMAL HIGH (ref 70–99)
Glucose-Capillary: 144 mg/dL — ABNORMAL HIGH (ref 70–99)

## 2020-04-19 LAB — CBC
HCT: 26.8 % — ABNORMAL LOW (ref 39.0–52.0)
Hemoglobin: 8.6 g/dL — ABNORMAL LOW (ref 13.0–17.0)
MCH: 28.4 pg (ref 26.0–34.0)
MCHC: 32.1 g/dL (ref 30.0–36.0)
MCV: 88.4 fL (ref 80.0–100.0)
Platelets: 281 10*3/uL (ref 150–400)
RBC: 3.03 MIL/uL — ABNORMAL LOW (ref 4.22–5.81)
RDW: 16.1 % — ABNORMAL HIGH (ref 11.5–15.5)
WBC: 9.3 10*3/uL (ref 4.0–10.5)
nRBC: 0 % (ref 0.0–0.2)

## 2020-04-19 LAB — TYPE AND SCREEN
ABO/RH(D): O POS
Antibody Screen: NEGATIVE
Unit division: 0

## 2020-04-19 LAB — MAGNESIUM: Magnesium: 2.3 mg/dL (ref 1.7–2.4)

## 2020-04-19 LAB — BPAM RBC
Blood Product Expiration Date: 202106022359
ISSUE DATE / TIME: 202105071414
Unit Type and Rh: 5100

## 2020-04-19 LAB — LACTATE DEHYDROGENASE: LDH: 248 U/L — ABNORMAL HIGH (ref 98–192)

## 2020-04-19 LAB — PROTIME-INR
INR: 1.9 — ABNORMAL HIGH (ref 0.8–1.2)
Prothrombin Time: 21.4 seconds — ABNORMAL HIGH (ref 11.4–15.2)

## 2020-04-19 MED ORDER — LIDOCAINE VISCOUS HCL 2 % MT SOLN
15.0000 mL | Freq: Once | OROMUCOSAL | Status: AC
  Administered 2020-04-19: 15 mL via ORAL
  Filled 2020-04-19: qty 15

## 2020-04-19 MED ORDER — WARFARIN 0.5 MG HALF TABLET
0.5000 mg | ORAL_TABLET | Freq: Once | ORAL | Status: AC
  Administered 2020-04-19: 0.5 mg via ORAL
  Filled 2020-04-19: qty 1

## 2020-04-19 MED ORDER — PROMETHAZINE HCL 25 MG/ML IJ SOLN
12.5000 mg | Freq: Three times a day (TID) | INTRAMUSCULAR | Status: DC | PRN
  Administered 2020-04-19 – 2020-05-03 (×6): 12.5 mg via INTRAVENOUS
  Filled 2020-04-19 (×7): qty 1

## 2020-04-19 MED ORDER — ALUM & MAG HYDROXIDE-SIMETH 200-200-20 MG/5ML PO SUSP
30.0000 mL | Freq: Once | ORAL | Status: AC
  Administered 2020-04-19: 30 mL via ORAL
  Filled 2020-04-19: qty 30

## 2020-04-19 MED ORDER — METOCLOPRAMIDE HCL 5 MG/ML IJ SOLN
5.0000 mg | Freq: Three times a day (TID) | INTRAMUSCULAR | Status: AC
  Administered 2020-04-19 – 2020-04-20 (×3): 5 mg via INTRAVENOUS
  Filled 2020-04-19 (×3): qty 2

## 2020-04-19 NOTE — Progress Notes (Signed)
Patient ID: [name] [name], male   DOB: 04/03/1951, 69 y.o.   MRN: [id number]    Advanced Heart Failure Rounding Note   Subjective:    - S/p HM3 VAD 4/8 w MAZE procedure + LAA clipping. - Extubated 4/9 - 4/12 LVAD speed increased to 5400 --> echo moderate-severe RV dysfunction, septum mildly left shifted, trivial pericardial effusion.  LVAD speed cut back to 5300.  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  - 4/20 developed hemothorax requiring emergent CT placement and eventual VATS - 4/21 EEG done given concerns for posturing and c/w moderate to severe diffuse encephalopathy. No seizures or epileptiform discharges were seen  - 04/03/20 Pump Stop --> controller change out. CT of head unchanged R MCA CVA - 04/06/20 Extubated - 5/1 CRRT stopped. Had first iHD 5/3.  - 5/4 1uRBC - 5/6 1u RBC  Awake and alert this am. C/o lower ab pain. Remains anuric. KUB ok. Bladder scan pending   VAD Interrogation  Flow 3.3 Speed 5300, Power 4  Pulse Index 7.5 VAD interrogated personally. Parameters stable.   Objective:   Weight Range:  Vital Signs:   Temp:  [97.6 F (36.4 C)-98.4 F (36.9 C)] 98.1 F (36.7 C) (05/08 1125) Pulse Rate:  [63-91] 68 (05/08 1115) Resp:  [9-23] 20 (05/08 1115) BP: (100-133)/(74-102) 119/94 (05/08 1115) SpO2:  [91 %-99 %] 94 % (05/08 1115) Weight:  [104.1 kg] 104.1 kg (05/08 0600) Last BM Date: 04/17/20  Weight change: Filed Weights   04/18/20 0730 04/18/20 1110 04/19/20 0600  Weight: 106 kg 104.1 kg 104.1 kg    Intake/Output:   Intake/Output Summary (Last 24 hours) at 04/19/2020 1149 Last data filed at 04/19/2020 0800 Gross per 24 hour  Intake 1254.94 ml  Output 0 ml  Net 1254.94 ml     Physical Exam: GENERAL: In bed. No acute distress. HEENT: Normal Cortrak in place Neck: supple. JVP not elevated.  Carotids 2+ bilat; no bruits. No lymphadenopathy or thryomegaly appreciated. Cor: LVAD hum.  L tunneled cath Lungs: Clear. Abdomen:  obese soft, mildly tender over bladded, non-distended. No hepatosplenomegaly. No bruits or masses. Hypoactive bowel sounds. Driveline site clean. Anchor in place.  Extremities: no cyanosis, clubbing, rash. Warm no edema  Neuro: alert & oriented x 3. No focal deficits. Moves all 4 but weak   Telemetry: A fib 60-70s Personally reviewed     Labs: Basic Metabolic Panel: Recent Labs  Lab 04/15/20 0417 04/15/20 0417 04/16/20 0450 04/16/20 0450 04/17/20 0043 04/18/20 0259 04/19/20 0239  NA 137  --  134*  --  133* 135 135  K 4.4  --  5.0  --  4.0 4.1 3.9  CL 96*  --  96*  --  95* 95* 94*  CO2 27  --  25  --  24 25 26   GLUCOSE 71  --  76  --  191* 144* 75  BUN 71*  --  97*  --  57* 89* 53*  CREATININE 4.54*  --  6.32*  --  4.34* 6.40* 4.37*  CALCIUM 8.1*   < > 8.2*   < > 8.0* 8.2* 8.2*  MG 2.6*  --  2.7*  --  2.2 2.5* 2.3  PHOS 5.2*  --  6.9*  --  5.9* 5.8* 5.1*   < > = values in this interval not displayed.    Liver Function Tests: Recent Labs  Lab 04/15/20 7672 04/16/20 0450 04/17/20 0947 04/18/20 0259 04/19/20 0239  ALBUMIN 1.9* 2.0* 2.4* 2.3* 2.3*   No results for input(s): LIPASE, AMYLASE in the last 168 hours. No results for input(s): AMMONIA in the last 168 hours.  CBC: Recent Labs  Lab 04/15/20 1700 04/16/20 0450 04/17/20 1254 04/18/20 0259 04/19/20 0239  WBC 8.4 9.0 8.8 7.8 9.3  HGB 8.4* 8.3* 7.4* 7.2* 8.6*  HCT 26.1* 26.0* 23.5* 23.0* 26.8*  MCV 89.1 89.7 89.7 88.8 88.4  PLT 226 244 252 261 281    Cardiac Enzymes: No results for input(s): CKTOTAL, CKMB, CKMBINDEX, TROPONINI in the last 168 hours.  BNP: BNP (last 3 results) Recent Labs    04/02/20 2342 04/10/20 0132 04/17/20 0043  BNP 332.7* 267.2* 582.1*    ProBNP (last 3 results) No results for input(s): PROBNP in the last 8760 hours.    Other results:  Imaging: DG Abd 1 View  Result Date: 04/19/2020 CLINICAL DATA:  Nausea and vomiting EXAM: ABDOMEN - 1 VIEW COMPARISON:  April 07, 2020 FINDINGS: There is contrast in the colon. There is no bowel dilatation or air-fluid level to suggest bowel obstruction. No free air. Feeding tube tip is in the third portion of the duodenum. There are phleboliths in the pelvis. Lung bases are clear. Note that there is a left ventricular assist device present. IMPRESSION: Feeding tube tip in third portion of duodenum. No bowel dilatation or air-fluid level to suggest bowel obstruction. No free air. Lung bases clear. Electronically Signed   By: Lowella Grip III M.D.   On: 04/19/2020 11:42     Medications:     Scheduled Medications: . sodium chloride   Intravenous Once  . aspirin  81 mg Per Tube Daily  . atorvastatin  80 mg Per Tube q1800  . B-complex with vitamin C  1 tablet Per Tube Daily  . bisacodyl  10 mg Oral Daily   Or  . bisacodyl  10 mg Rectal Daily  . Chlorhexidine Gluconate Cloth  6 each Topical Daily  . citalopram  10 mg Per Tube Daily  . darbepoetin (ARANESP) injection - NON-DIALYSIS  100 mcg Subcutaneous Q Tue-1800  . feeding supplement (NEPRO CARB STEADY)  237 mL Oral BID BM  . feeding supplement (PRO-STAT SUGAR FREE 64)  30 mL Per Tube BID  . influenza vaccine adjuvanted  0.5 mL Intramuscular Tomorrow-1000  . insulin aspart  0-24 Units Subcutaneous Q4H  . insulin aspart  6 Units Subcutaneous Q4H  . insulin glargine  24 Units Subcutaneous BID  . midodrine  5 mg Per Tube TID WC  . pantoprazole (PROTONIX) IV  40 mg Intravenous Daily  . sodium chloride flush  10-40 mL Intracatheter Q12H  . warfarin  0.5 mg Oral ONCE-1600  . Warfarin - Pharmacist Dosing Inpatient   Does not apply q1600    Infusions: . sodium chloride Stopped (04/15/20 1731)  . feeding supplement (NEPRO CARB STEADY) 62 mL/hr at 04/19/20 0437  . norepinephrine (LEVOPHED) Adult infusion Stopped (04/11/20 2215)    PRN Medications: sodium chloride, acetaminophen, dextrose, hydrALAZINE, levalbuterol, ondansetron (ZOFRAN) IV, oxyCODONE, sodium  chloride flush, sorbitol, traMADol   Assessment/Plan:   1. Acute on chronic systolic HF -> cardiogenic shock-> S/p HM3 LVAD - Echo 2016 EF 30-35% - Echo 1/21 EF 25% - Admitted with NYHA IV symptoms and AKI with attempts at diuresis. Initial co-ox 39% - Echo this admit: EF 10-15% moderate RV dysfunction - R/LHC cath 3/21 with severe 3v CAD and low output with CI 1.7.  - Initially planned for CABG but  given need for re-do sternotomy, relatively poor targets and longstanding low EF, VAD felt to be better option  - S/p HM3 VAD 4/8 - Stable off drips.  -  On Midodrine 5 tid. MAP 90-100s. May be able to cut midodrine if Maps remain high.   - Now off CVVH. Now on iHD - Volume status ok.  Continue HD per nephrology. Next session will be Monday  2. VAD - VAD interrogated personally. Parameters stable. - Had Pump Stop 04/03/20. Controller changed out with old controller sent back to [facility] for evaluation.  - on aspirin 81 mg daily + coumadin.  - LDH 248 - INR 1.9 . We have lowered INR goal to 1.8-2.3 given anemia. Discussed dosing with PharmD personally.  3. CAD with unstable angina - s/p previous PCI. - cath 03/11/2020 with severe 3v CAD - now s/p VAD on 03/23/2020   - No s/s ischemia  4. AKI on CKD 3a -> ESRD - Nephrology consulted. Started CVVHD 4/13.    - Renal US 4/5 unremarkable.  - Hopefully renal function will recover but Renal team not optimistic - He is now off NE w/ stable MAP.  - Off CVVH.  1st iHD 5/3.  -  Tunnel HD cath placed 5/5. Appreciate IR --Volume status ok.  Continue HD per nephrology. Next session will be Monday  5. Permanent AF - now s/p MAZE + LAA Clipping 4/8   - Rate controlled.  - On coumadin. INR 1.9  6. MVP s/p MV repair - On admit with recurrent severe MR on echo and with huge v-waves on PCWP tracing - s/p VAD  7. Hemothoax - s/p CT placement and VATs on 4/22 - CTs pulled 4/26 - stable  8. ID/leukocytosis - 4/11 procalcitonin 1.96  - Started  empiric coverage for PNA with vancomycin/cefepime for 7 days. Stopped 4/17 - Given recent VATs and high-dose pressor requirements he was treated with broad spectrum abx, linezolid and cefepime (completed course 4/28)  - WBC normal.   9. Acute Hypoxic Respiratory Failure - Re-intubated 4/20 with hemothorax- - Extubated 4/25 - Remains stable on , sats stable.   10. Malnutrition  - Albumin 2.3 - Remains on tube feeds.  - Speech following--> placed on thin liquid dysphagia 1  11. CVA/anoxic brain injury - 03/27/20 CT large posterior MCA ischemic infarct, possibly from atrial fibrillation.  INR was supratherapeutic when CVA found.  Stable LDH, do not think partial pump thrombosis is the culprit. - Still with some L weakness and inattention but improving  - PT/OT/speech following.  - CIR consulted.     12. Anemia, post-op  - Received 2u RBC 04/05/20 + 1 Unit on 5/3 + 1 unit 5/4 + 1u 5/7 - hgb stable 8.6 today - lower INR goal to 1.8-2.3  13. Hyperkalemia - resolved  - Continue iHD  - can use Lokelma PRN on non HD days if needed   14. DM2, poorly controlled - hgbA1c 11.1% - continue insulin   15. Agitation/ Depression  - Celexa 10 mg daily added. - Monitor   16. Deconditioning - Continue PT/OT    17. Lower ab pain - kub ok - good stool output in rectal tube - plan bladder scan  Length of Stay: [place], MD  11:49 AM  04/19/2020

## 2020-04-19 NOTE — Progress Notes (Signed)
ANTICOAGULATION CONSULT NOTE  Pharmacy Consult for warfarin Indication: post op LVAD  / Aflutter  Allergies  Allergen Reactions  . Liraglutide Nausea And Vomiting  . Lisinopril Cough    Patient Measurements: Height: 6' (182.9 cm) Weight: 104.1 kg (229 lb 8 oz) IBW/kg (Calculated) : 77.6 Heparin Dosing Weight: 101.2 kg  Vital Signs: Temp: 98.1 F (36.7 C) (05/08 1125) Temp Source: Oral (05/08 1125) BP: 129/100 (05/08 1100) Pulse Rate: 77 (05/08 1100)  Labs: Recent Labs    04/17/20 0043 04/17/20 1254 04/17/20 1254 04/18/20 0259 04/19/20 0239  HGB  --  7.4*   < > 7.2* 8.6*  HCT  --  23.5*  --  23.0* 26.8*  PLT  --  252  --  261 281  LABPROT 25.0*  --   --  23.2* 21.4*  INR 2.4*  --   --  2.1* 1.9*  CREATININE 4.34*  --   --  6.40* 4.37*   < > = values in this interval not displayed.    Estimated Creatinine Clearance: 19.6 mL/min (A) (by C-G formula based on SCr of 4.37 mg/dL (H)).   Assessment: 73 YOM with atrial fibrillation on Eliquis PTA s/p R & L heart cath found to have 3v disease s/p  LVAD  Implant 4/8. Warfarin initiated on 4/9 after discussing with MD.  CT on 4/15 - found to have large right MCA stroke.   INR down slightly to 1.9, still within goal range. Hgb 8.6, Pltc with normal limits. LDH ~250s. No bleeding noted.   INR goal reduced 5/4 per HF team.    Goal of Therapy:  INR 1.8-2.3 Monitor platelets by anticoagulation protocol: Yes   Plan:  Warfarin 0.5 mg x 1 tonight Daily PT/INR.  Erin Hearing PharmD., BCPS Clinical Pharmacist 04/19/2020 11:42 AM

## 2020-04-19 NOTE — Progress Notes (Signed)
Patient ID: [name] [name], male   DOB: 10-16-1951, 69 y.o.   MRN: [id number] S: Pt tolerated HD with UF of 1.5 liters yesterday.  Resting comfortably.  No issues overnight. O:BP (!) 112/91   Pulse 87   Temp 98.4 F (36.9 C) (Oral)   Resp 13   Ht 6' (1.829 m)   Wt 104.1 kg   SpO2 97%   BMI 31.13 kg/m   Intake/Output Summary (Last 24 hours) at 04/19/2020 1019 Last data filed at 04/19/2020 0600 Gross per 24 hour  Intake 1254.94 ml  Output 1500 ml  Net -245.06 ml   Intake/Output: I/O last 3 completed shifts: In: 1925.8 [Blood:315; NG/GT:1610.8] Out: 1500 [Other:1500]  Intake/Output this shift:  No intake/output data recorded. Weight change: 2.2 kg Gen: NAD CVS: mechanical hum Resp: cta Abd: +BS, soft Ext: trace edema  Recent Labs  Lab 04/13/20 0418 04/14/20 0440 04/15/20 0417 04/16/20 0450 04/17/20 0043 04/18/20 0259 04/19/20 0239  NA 138 136 137 134* 133* 135 135  K 5.2* 6.1* 4.4 5.0 4.0 4.1 3.9  CL 106 101 96* 96* 95* 95* 94*  CO2 25 25 27 25 24 25 26   GLUCOSE 96 109* 71 76 191* 144* 75  BUN 51* 96* 71* 97* 57* 89* 53*  CREATININE 2.87* 5.52* 4.54* 6.32* 4.34* 6.40* 4.37*  ALBUMIN 2.0* 2.1* 1.9* 2.0* 2.4* 2.3* 2.3*  CALCIUM 7.8* 8.2* 8.1* 8.2* 8.0* 8.2* 8.2*  PHOS 2.9 4.2 5.2* 6.9* 5.9* 5.8* 5.1*   Liver Function Tests: Recent Labs  Lab 04/17/20 0043 04/18/20 0259 04/19/20 0239  ALBUMIN 2.4* 2.3* 2.3*   No results for input(s): LIPASE, AMYLASE in the last 168 hours. No results for input(s): AMMONIA in the last 168 hours. CBC: Recent Labs  Lab 04/15/20 1700 04/15/20 1700 04/16/20 0450 04/16/20 0450 04/17/20 1254 04/18/20 0259 04/19/20 0239  WBC 8.4   < > 9.0   < > 8.8 7.8 9.3  HGB 8.4*   < > 8.3*   < > 7.4* 7.2* 8.6*  HCT 26.1*   < > 26.0*   < > 23.5* 23.0* 26.8*  MCV 89.1  --  89.7  --  89.7 88.8 88.4  PLT 226   < > 244   < > 252 261 281   < > = values in this interval not displayed.   Cardiac Enzymes: No results for input(s): CKTOTAL,  CKMB, CKMBINDEX, TROPONINI in the last 168 hours. CBG: Recent Labs  Lab 04/18/20 1941 04/18/20 2338 04/19/20 0422 04/19/20 0447 04/19/20 0828  GLUCAP 134* 128* 53* 156* 142*    Iron Studies: No results for input(s): IRON, TIBC, TRANSFERRIN, FERRITIN in the last 72 hours. Studies/Results: No results found. . sodium chloride   Intravenous Once  . aspirin  81 mg Per Tube Daily  . atorvastatin  80 mg Per Tube q1800  . B-complex with vitamin C  1 tablet Per Tube Daily  . bisacodyl  10 mg Oral Daily   Or  . bisacodyl  10 mg Rectal Daily  . Chlorhexidine Gluconate Cloth  6 each Topical Daily  . citalopram  10 mg Per Tube Daily  . darbepoetin (ARANESP) injection - NON-DIALYSIS  100 mcg Subcutaneous Q Tue-1800  . feeding supplement (NEPRO CARB STEADY)  237 mL Oral BID BM  . feeding supplement (PRO-STAT SUGAR FREE 64)  30 mL Per Tube BID  . influenza vaccine adjuvanted  0.5 mL Intramuscular Tomorrow-1000  . insulin aspart  0-24 Units Subcutaneous Q4H  . insulin aspart  6 Units Subcutaneous Q4H  . insulin glargine  24 Units Subcutaneous BID  . midodrine  5 mg Per Tube TID WC  . pantoprazole (PROTONIX) IV  40 mg Intravenous Daily  . sodium chloride flush  10-40 mL Intracatheter Q12H  . sodium zirconium cyclosilicate  10 g Oral Daily  . Warfarin - Pharmacist Dosing Inpatient   Does not apply q1600    BMET    Component Value Date/Time   NA 135 04/19/2020 0239   NA 132 (L) 03/07/2020 1501   K 3.9 04/19/2020 0239   CL 94 (L) 04/19/2020 0239   CO2 26 04/19/2020 0239   GLUCOSE 75 04/19/2020 0239   BUN 53 (H) 04/19/2020 0239   BUN 16 03/07/2020 1501   CREATININE 4.37 (H) 04/19/2020 0239   CREATININE 1.29 (H) 12/10/2016 1011   CALCIUM 8.2 (L) 04/19/2020 0239   GFRNONAA 13 (L) 04/19/2020 0239   GFRNONAA 57 (L) 01/28/2015 1558   GFRAA 15 (L) 04/19/2020 0239   GFRAA 66 01/28/2015 1558   CBC    Component Value Date/Time   WBC 9.3 04/19/2020 0239   RBC 3.03 (L) 04/19/2020 0239    HGB 8.6 (L) 04/19/2020 0239   HGB 15.8 04/24/2018 1148   HCT 26.8 (L) 04/19/2020 0239   HCT 45.9 04/24/2018 1148   PLT 281 04/19/2020 0239   PLT 281 04/24/2018 1148   MCV 88.4 04/19/2020 0239   MCV 89 04/24/2018 1148   MCH 28.4 04/19/2020 0239   MCHC 32.1 04/19/2020 0239   RDW 16.1 (H) 04/19/2020 0239   RDW 14.2 04/24/2018 1148   LYMPHSABS 0.4 (L) 04/02/2020 0429   LYMPHSABS 1.1 04/24/2018 1148   MONOABS 2.2 (H) 04/02/2020 0429   EOSABS 0.0 04/02/2020 0429   EOSABS 0.2 04/24/2018 1148   BASOSABS 0.1 04/02/2020 0429   BASOSABS 0.1 04/24/2018 1148     Assessment/Plan:  1. AKI, in setting of cardiogenic shock, pressors, s/p LVAD 03/22/2020. CRRT initiated on 03/25/20 and stopped 04/12/20 since he is off of pressors.Tolerated hisfirst intermittent HD 04/14/20 1. Tolerated HD for the last 3 sessions this week.  Hopefully he will be able to hold off on more HD until Monday.  IF he doesn't we may need to plan 4 HD sessions per week. 2. No heparin with HD on systemic anticoagulation 2. Hyperkalemia-improved with HD and lokelma.  Will stop lokelma. 3. Vascular access-temp cath removed and RIJ TDC placed 04/16/20 by IR. 4. sCHF s/p LVAD 03/19/2020 5. A fib s/p MAZE and LAA clipping 4/8 6. R MCA ischemic stroke- large, seen on CT on 03/27/20. 7. Left hemothorax s/p VATS 04/02/2020. 8. Anemia-s/p transfusion 04/14/20 without significant improvement of Hgb. Receiving blood again today. Continue to follow.  Will add ESA. 9. MVP s/p MV repair  Donetta Potts, MD [facility] [phone]

## 2020-04-20 LAB — RENAL FUNCTION PANEL
Albumin: 2.3 g/dL — ABNORMAL LOW (ref 3.5–5.0)
Anion gap: 15 (ref 5–15)
BUN: 82 mg/dL — ABNORMAL HIGH (ref 8–23)
CO2: 26 mmol/L (ref 22–32)
Calcium: 8.5 mg/dL — ABNORMAL LOW (ref 8.9–10.3)
Chloride: 94 mmol/L — ABNORMAL LOW (ref 98–111)
Creatinine, Ser: 5.8 mg/dL — ABNORMAL HIGH (ref 0.61–1.24)
GFR calc Af Amer: 11 mL/min — ABNORMAL LOW (ref >60)
GFR calc non Af Amer: 9 mL/min — ABNORMAL LOW (ref >60)
Glucose, Bld: 187 mg/dL — ABNORMAL HIGH (ref 70–99)
Phosphorus: 5.2 mg/dL — ABNORMAL HIGH (ref 2.5–4.6)
Potassium: 3.8 mmol/L (ref 3.5–5.1)
Sodium: 135 mmol/L (ref 135–145)

## 2020-04-20 LAB — GLUCOSE, CAPILLARY
Glucose-Capillary: 107 mg/dL — ABNORMAL HIGH (ref 70–99)
Glucose-Capillary: 118 mg/dL — ABNORMAL HIGH (ref 70–99)
Glucose-Capillary: 123 mg/dL — ABNORMAL HIGH (ref 70–99)
Glucose-Capillary: 140 mg/dL — ABNORMAL HIGH (ref 70–99)
Glucose-Capillary: 156 mg/dL — ABNORMAL HIGH (ref 70–99)
Glucose-Capillary: 175 mg/dL — ABNORMAL HIGH (ref 70–99)
Glucose-Capillary: 198 mg/dL — ABNORMAL HIGH (ref 70–99)

## 2020-04-20 LAB — CBC
HCT: 26.7 % — ABNORMAL LOW (ref 39.0–52.0)
Hemoglobin: 8.3 g/dL — ABNORMAL LOW (ref 13.0–17.0)
MCH: 27.4 pg (ref 26.0–34.0)
MCHC: 31.1 g/dL (ref 30.0–36.0)
MCV: 88.1 fL (ref 80.0–100.0)
Platelets: 288 10*3/uL (ref 150–400)
RBC: 3.03 MIL/uL — ABNORMAL LOW (ref 4.22–5.81)
RDW: 15.9 % — ABNORMAL HIGH (ref 11.5–15.5)
WBC: 9.1 10*3/uL (ref 4.0–10.5)
nRBC: 0 % (ref 0.0–0.2)

## 2020-04-20 LAB — PROTIME-INR
INR: 2.1 — ABNORMAL HIGH (ref 0.8–1.2)
Prothrombin Time: 23.1 seconds — ABNORMAL HIGH (ref 11.4–15.2)

## 2020-04-20 LAB — LACTATE DEHYDROGENASE: LDH: 233 U/L — ABNORMAL HIGH (ref 98–192)

## 2020-04-20 LAB — MAGNESIUM: Magnesium: 2.7 mg/dL — ABNORMAL HIGH (ref 1.7–2.4)

## 2020-04-20 MED ORDER — SORBITOL 70 % SOLN
30.0000 mL | Freq: Once | Status: AC
  Administered 2020-04-20: 30 mL
  Filled 2020-04-20: qty 30

## 2020-04-20 MED ORDER — WARFARIN 0.5 MG HALF TABLET
0.5000 mg | ORAL_TABLET | Freq: Once | ORAL | Status: AC
  Administered 2020-04-20: 0.5 mg via ORAL
  Filled 2020-04-20: qty 1

## 2020-04-20 NOTE — Progress Notes (Signed)
ANTICOAGULATION CONSULT NOTE  Pharmacy Consult for warfarin Indication: post op LVAD  / Aflutter  Allergies  Allergen Reactions  . Liraglutide Nausea And Vomiting  . Lisinopril Cough    Patient Measurements: Height: 6' (182.9 cm) Weight: 98.7 kg (217 lb 9.5 oz) IBW/kg (Calculated) : 77.6 Heparin Dosing Weight: 101.2 kg  Vital Signs: Temp: 97.6 F (36.4 C) (05/09 0806) Temp Source: Oral (05/09 0806) BP: 111/81 (05/09 0800) Pulse Rate: 90 (05/09 0800)  Labs: Recent Labs    04/18/20 0259 04/18/20 0259 04/19/20 0239 04/20/20 0253  HGB 7.2*   < > 8.6* 8.3*  HCT 23.0*  --  26.8* 26.7*  PLT 261  --  281 288  LABPROT 23.2*  --  21.4* 23.1*  INR 2.1*  --  1.9* 2.1*  CREATININE 6.40*  --  4.37* 5.80*   < > = values in this interval not displayed.    Estimated Creatinine Clearance: 14.4 mL/min (A) (by C-G formula based on SCr of 5.8 mg/dL (H)).   Assessment: 68 YOM with atrial fibrillation on Eliquis PTA s/p R & L heart cath found to have 3v disease s/p  LVAD  Implant 4/8. Warfarin initiated on 4/9 after discussing with MD.  CT on 4/15 - found to have large right MCA stroke.   INR up today to 2.1, within goal range. Hgb 8.3, Pltc with normal limits. LDH ~200s. No bleeding noted.   INR goal reduced 5/4 per HF team.    Goal of Therapy:  INR 1.8-2.3 Monitor platelets by anticoagulation protocol: Yes   Plan:  Warfarin 0.5 mg x 1 tonight Daily PT/INR.  Erin Hearing PharmD., BCPS Clinical Pharmacist 04/20/2020 8:45 AM

## 2020-04-20 NOTE — Progress Notes (Signed)
Patient ID: [name] [name], male   DOB: June 27, 1951, 69 y.o.   MRN: [id number]    Advanced Heart Failure Rounding Note   Subjective:    - S/p HM3 VAD 4/8 w MAZE procedure + LAA clipping. - Extubated 4/9 - 4/12 LVAD speed increased to 5400 --> echo moderate-severe RV dysfunction, septum mildly left shifted, trivial pericardial effusion.  LVAD speed cut back to 5300.  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  - 4/20 developed hemothorax requiring emergent CT placement and eventual VATS - 4/21 EEG done given concerns for posturing and c/w moderate to severe diffuse encephalopathy. No seizures or epileptiform discharges were seen  - 04/03/20 Pump Stop --> controller change out. CT of head unchanged R MCA CVA - 04/06/20 Extubated - 5/1 CRRT stopped. Had first iHD 5/3.  - 5/4 1uRBC - 5/6 1u RBC  More awake today. Continues to complain of nausea and lower abdominal discomfort. KUB and bladder scan ok. No response to zofran or reglan.   MAPs 80   VAD Interrogation  Flow 4.4 Speed 5300, Power 4.0  Pulse Index 3.8 VAD interrogated personally. Parameters stable.   Objective:   Weight Range:  Vital Signs:   Temp:  [96.8 F (36 C)-97.8 F (36.6 C)] 97.8 F (36.6 C) (05/09 1120) Pulse Rate:  [60-99] 60 (05/09 1200) Resp:  [11-39] 14 (05/09 1200) BP: (91-128)/(71-103) 91/71 (05/09 1200) SpO2:  [93 %-99 %] 96 % (05/09 1200) Weight:  [98.7 kg] 98.7 kg (05/09 0030) Last BM Date: 04/19/20  Weight change: Filed Weights   04/18/20 1110 04/19/20 0600 04/20/20 0030  Weight: 104.1 kg 104.1 kg 98.7 kg    Intake/Output:   Intake/Output Summary (Last 24 hours) at 04/20/2020 1212 Last data filed at 04/20/2020 0957 Gross per 24 hour  Intake 1246 ml  Output 65 ml  Net 1181 ml     Physical Exam: General:  NAD.  HEENT: normal +cor-trak Neck: supple. JVP not elevated.  Carotids 2+ bilat; no bruits. No lymphadenopathy or thryomegaly appreciated. Cor: LVAD hum.  Lungs:  Clear. Abdomen: obese soft, nontender, non-distended. No hepatosplenomegaly. No bruits or masses. Good bowel sounds. Driveline site clean. Anchor in place.  Extremities: no cyanosis, clubbing, rash. Warm no edema  Neuro: alert & oriented x 3. No focal deficits. Moves all 4 without problem   Telemetry: A fib 60s Personally reviewed     Labs: Basic Metabolic Panel: Recent Labs  Lab 04/16/20 0450 04/16/20 0450 04/17/20 0043 04/17/20 0043 04/18/20 0259 04/19/20 0239 04/20/20 0253  NA 134*  --  133*  --  135 135 135  K 5.0  --  4.0  --  4.1 3.9 3.8  CL 96*  --  95*  --  95* 94* 94*  CO2 25  --  24  --  25 26 26   GLUCOSE 76  --  191*  --  144* 75 187*  BUN 97*  --  57*  --  89* 53* 82*  CREATININE 6.32*  --  4.34*  --  6.40* 4.37* 5.80*  CALCIUM 8.2*   < > 8.0*   < > 8.2* 8.2* 8.5*  MG 2.7*  --  2.2  --  2.5* 2.3 2.7*  PHOS 6.9*  --  5.9*  --  5.8* 5.1* 5.2*   < > = values in this interval not displayed.    Liver Function Tests: Recent Labs  Lab 04/16/20 0450 04/17/20 0043 04/18/20 0259 04/19/20 0239 04/20/20  0253  ALBUMIN 2.0* 2.4* 2.3* 2.3* 2.3*   No results for input(s): LIPASE, AMYLASE in the last 168 hours. No results for input(s): AMMONIA in the last 168 hours.  CBC: Recent Labs  Lab 04/16/20 0450 04/17/20 1254 04/18/20 0259 04/19/20 0239 04/20/20 0253  WBC 9.0 8.8 7.8 9.3 9.1  HGB 8.3* 7.4* 7.2* 8.6* 8.3*  HCT 26.0* 23.5* 23.0* 26.8* 26.7*  MCV 89.7 89.7 88.8 88.4 88.1  PLT 244 252 261 281 288    Cardiac Enzymes: No results for input(s): CKTOTAL, CKMB, CKMBINDEX, TROPONINI in the last 168 hours.  BNP: BNP (last 3 results) Recent Labs    04/02/20 2342 04/10/20 0132 04/17/20 0043  BNP 332.7* 267.2* 582.1*    ProBNP (last 3 results) No results for input(s): PROBNP in the last 8760 hours.    Other results:  Imaging: DG Abd 1 View  Result Date: 04/19/2020 CLINICAL DATA:  Nausea and vomiting EXAM: ABDOMEN - 1 VIEW COMPARISON:  April 07, 2020 FINDINGS: There is contrast in the colon. There is no bowel dilatation or air-fluid level to suggest bowel obstruction. No free air. Feeding tube tip is in the third portion of the duodenum. There are phleboliths in the pelvis. Lung bases are clear. Note that there is a left ventricular assist device present. IMPRESSION: Feeding tube tip in third portion of duodenum. No bowel dilatation or air-fluid level to suggest bowel obstruction. No free air. Lung bases clear. Electronically Signed   By: Lowella Grip III M.D.   On: 04/19/2020 11:42     Medications:     Scheduled Medications: . sodium chloride   Intravenous Once  . aspirin  81 mg Per Tube Daily  . atorvastatin  80 mg Per Tube q1800  . B-complex with vitamin C  1 tablet Per Tube Daily  . bisacodyl  10 mg Oral Daily   Or  . bisacodyl  10 mg Rectal Daily  . Chlorhexidine Gluconate Cloth  6 each Topical Daily  . citalopram  10 mg Per Tube Daily  . darbepoetin (ARANESP) injection - NON-DIALYSIS  100 mcg Subcutaneous Q Tue-1800  . feeding supplement (NEPRO CARB STEADY)  237 mL Oral BID BM  . feeding supplement (PRO-STAT SUGAR FREE 64)  30 mL Per Tube BID  . influenza vaccine adjuvanted  0.5 mL Intramuscular Tomorrow-1000  . insulin aspart  0-24 Units Subcutaneous Q4H  . insulin aspart  6 Units Subcutaneous Q4H  . insulin glargine  24 Units Subcutaneous BID  . pantoprazole (PROTONIX) IV  40 mg Intravenous Daily  . sodium chloride flush  10-40 mL Intracatheter Q12H  . warfarin  0.5 mg Oral ONCE-1600  . Warfarin - Pharmacist Dosing Inpatient   Does not apply q1600    Infusions: . sodium chloride Stopped (04/15/20 1731)  . feeding supplement (NEPRO CARB STEADY) 62 mL/hr at 04/19/20 0437  . norepinephrine (LEVOPHED) Adult infusion Stopped (04/11/20 2215)    PRN Medications: sodium chloride, acetaminophen, dextrose, hydrALAZINE, levalbuterol, ondansetron (ZOFRAN) IV, oxyCODONE, promethazine, sodium chloride flush, sorbitol,  traMADol   Assessment/Plan:   1. Acute on chronic systolic HF -> cardiogenic shock-> S/p HM3 LVAD - Echo 2016 EF 30-35% - Echo 1/21 EF 25% - Admitted with NYHA IV symptoms and AKI with attempts at diuresis. Initial co-ox 39% - Echo this admit: EF 10-15% moderate RV dysfunction - R/LHC cath 3/21 with severe 3v CAD and low output with CI 1.7.  - Initially planned for CABG but given need for re-do sternotomy, relatively poor  targets and longstanding low EF, VAD felt to be better option  - S/p HM3 VAD 4/8 - Stable off drips.  -  On Midodrine 5 tid. MAP 80s. Can wean midodrine as tolerated - Now off CVVH. Now on iHD - Volume status ok.  Continue HD per nephrology. Next session tomorrow.   2. VAD - VAD interrogated personally. Parameters stable. - Had Pump Stop 04/03/20. Controller changed out with old controller sent back to [facility] for evaluation.  - on aspirin 81 mg daily + coumadin.  - LDH 233 - INR 2.1. We have lowered INR goal to 1.8-2.3 given anemia. Discussed dosing with PharmD personally.  3. CAD with unstable angina - s/p previous PCI. - cath 03/09/2020 with severe 3v CAD - now s/p VAD on 03/31/2020   - No s/s ischemia  4. AKI on CKD 3a -> ESRD - Nephrology consulted. Started CVVHD 4/13.    - Renal US 4/5 unremarkable.  - Hopefully renal function will recover but Renal team not optimistic - He is now off NE w/ stable MAP.  - Off CVVH.  1st iHD 5/3.  -  Tunnel HD cath placed 5/5. Appreciate IR --Volume status ok.  Continue HD per nephrology. Next session tomorrow  5. Permanent AF - now s/p MAZE + LAA Clipping 4/8   - Rate controlled.  - On coumadin. INR 2.1  6. MVP s/p MV repair - On admit with recurrent severe MR on echo and with huge v-waves on PCWP tracing - s/p VAD  7. Hemothoax - s/p CT placement and VATs on 4/22 - CTs pulled 4/26 - stable  8. ID/leukocytosis - 4/11 procalcitonin 1.96  - Started empiric coverage for PNA with vancomycin/cefepime for 7 days.  Stopped 4/17 - Given recent VATs and high-dose pressor requirements he was treated with broad spectrum abx, linezolid and cefepime (completed course 4/28)  - WBC normal today at 9.1  9. Acute Hypoxic Respiratory Failure - Re-intubated 4/20 with hemothorax- - Extubated 4/25 - Remains stable on [place], sats stable.   10. Malnutrition  - Albumin 2.3 - Remains on tube feeds.  - Speech following--> placed on thin liquid dysphagia 1  11. CVA/anoxic brain injury - 03/27/20 CT large posterior MCA ischemic infarct, possibly from atrial fibrillation.  INR was supratherapeutic when CVA found.  Stable LDH, do not think partial pump thrombosis is the culprit. - Still with some L weakness and inattention but improving  - PT/OT/speech following.  - CIR consulted.     12. Anemia, post-op  - Received 2u RBC 04/05/20 + 1 Unit on 5/3 + 1 unit 5/4 + 1u 5/7 - hgb stable 8.3 today - lower INR goal to 1.8-2.3  13. Hyperkalemia - resolved  - Continue iHD  - can use Lokelma PRN on non HD days if needed   14. DM2, poorly controlled - hgbA1c 11.1% - continue insulin   15. Agitation/ Depression  - Celexa 10 mg daily added. - Monitor   16. Deconditioning - Continue PT/OT    17. Lower ab pain - kub ok. Bladder scan ok  - not responding to reglan or zofran - will give dose of sorbitol  Length of Stay: 5   Glori Bickers, MD  12:12 PM  04/20/2020

## 2020-04-20 NOTE — Progress Notes (Signed)
Patient ID: [name] [name], male   DOB: Nov 13, 1951, 69 y.o.   MRN: [id number] S: No events overnight O:BP 111/81 (BP Location: Right Arm)   Pulse 90   Temp 97.6 F (36.4 C) (Oral)   Resp 17   Ht 6' (1.829 m)   Wt 98.7 kg   SpO2 95%   BMI 29.51 kg/m   Intake/Output Summary (Last 24 hours) at 04/20/2020 1027 Last data filed at 04/20/2020 0957 Gross per 24 hour  Intake 1370 ml  Output 65 ml  Net 1305 ml   Intake/Output: I/O last 3 completed shifts: In: 1947.9 [P.O.:120; NG/GT:1827.9] Out: 65 [Stool:65]  Intake/Output this shift:  Total I/O In: 10 [I.V.:10] Out: -  Weight change: -7.3 kg Gen: resting in bed in NAD CVS: mechanical hum Resp: cta Abd: +BS< soft, nt/nd Ext: trace edema  Recent Labs  Lab 04/14/20 0440 04/15/20 0417 04/16/20 0450 04/17/20 0043 04/18/20 0259 04/19/20 0239 04/20/20 0253  NA 136 137 134* 133* 135 135 135  K 6.1* 4.4 5.0 4.0 4.1 3.9 3.8  CL 101 96* 96* 95* 95* 94* 94*  CO2 25 27 25 24 25 26 26   GLUCOSE 109* 71 76 191* 144* 75 187*  BUN 96* 71* 97* 57* 89* 53* 82*  CREATININE 5.52* 4.54* 6.32* 4.34* 6.40* 4.37* 5.80*  ALBUMIN 2.1* 1.9* 2.0* 2.4* 2.3* 2.3* 2.3*  CALCIUM 8.2* 8.1* 8.2* 8.0* 8.2* 8.2* 8.5*  PHOS 4.2 5.2* 6.9* 5.9* 5.8* 5.1* 5.2*   Liver Function Tests: Recent Labs  Lab 04/18/20 0259 04/19/20 0239 04/20/20 0253  ALBUMIN 2.3* 2.3* 2.3*   No results for input(s): LIPASE, AMYLASE in the last 168 hours. No results for input(s): AMMONIA in the last 168 hours. CBC: Recent Labs  Lab 04/16/20 0450 04/16/20 0450 04/17/20 1254 04/17/20 1254 04/18/20 0259 04/19/20 0239 04/20/20 0253  WBC 9.0   < > 8.8   < > 7.8 9.3 9.1  HGB 8.3*   < > 7.4*   < > 7.2* 8.6* 8.3*  HCT 26.0*   < > 23.5*   < > 23.0* 26.8* 26.7*  MCV 89.7  --  89.7  --  88.8 88.4 88.1  PLT 244   < > 252   < > 261 281 288   < > = values in this interval not displayed.   Cardiac Enzymes: No results for input(s): CKTOTAL, CKMB, CKMBINDEX, TROPONINI in the  last 168 hours. CBG: Recent Labs  Lab 04/19/20 1547 04/19/20 2016 04/19/20 2358 04/20/20 0324 04/20/20 0807  GLUCAP 160* 144* 198* 175* 156*    Iron Studies: No results for input(s): IRON, TIBC, TRANSFERRIN, FERRITIN in the last 72 hours. Studies/Results: DG Abd 1 View  Result Date: 04/19/2020 CLINICAL DATA:  Nausea and vomiting EXAM: ABDOMEN - 1 VIEW COMPARISON:  April 07, 2020 FINDINGS: There is contrast in the colon. There is no bowel dilatation or air-fluid level to suggest bowel obstruction. No free air. Feeding tube tip is in the third portion of the duodenum. There are phleboliths in the pelvis. Lung bases are clear. Note that there is a left ventricular assist device present. IMPRESSION: Feeding tube tip in third portion of duodenum. No bowel dilatation or air-fluid level to suggest bowel obstruction. No free air. Lung bases clear. Electronically Signed   By: Lowella Grip III M.D.   On: 04/19/2020 11:42   . sodium chloride   Intravenous Once  . aspirin  81 mg Per Tube Daily  . atorvastatin  80 mg Per  Tube q1800  . B-complex with vitamin C  1 tablet Per Tube Daily  . bisacodyl  10 mg Oral Daily   Or  . bisacodyl  10 mg Rectal Daily  . Chlorhexidine Gluconate Cloth  6 each Topical Daily  . citalopram  10 mg Per Tube Daily  . darbepoetin (ARANESP) injection - NON-DIALYSIS  100 mcg Subcutaneous Q Tue-1800  . feeding supplement (NEPRO CARB STEADY)  237 mL Oral BID BM  . feeding supplement (PRO-STAT SUGAR FREE 64)  30 mL Per Tube BID  . influenza vaccine adjuvanted  0.5 mL Intramuscular Tomorrow-1000  . insulin aspart  0-24 Units Subcutaneous Q4H  . insulin aspart  6 Units Subcutaneous Q4H  . insulin glargine  24 Units Subcutaneous BID  . pantoprazole (PROTONIX) IV  40 mg Intravenous Daily  . sodium chloride flush  10-40 mL Intracatheter Q12H  . warfarin  0.5 mg Oral ONCE-1600  . Warfarin - Pharmacist Dosing Inpatient   Does not apply q1600    BMET    Component Value  Date/Time   NA 135 04/20/2020 0253   NA 132 (L) 03/07/2020 1501   K 3.8 04/20/2020 0253   CL 94 (L) 04/20/2020 0253   CO2 26 04/20/2020 0253   GLUCOSE 187 (H) 04/20/2020 0253   BUN 82 (H) 04/20/2020 0253   BUN 16 03/07/2020 1501   CREATININE 5.80 (H) 04/20/2020 0253   CREATININE 1.29 (H) 12/10/2016 1011   CALCIUM 8.5 (L) 04/20/2020 0253   GFRNONAA 9 (L) 04/20/2020 0253   GFRNONAA 57 (L) 01/28/2015 1558   GFRAA 11 (L) 04/20/2020 0253   GFRAA 66 01/28/2015 1558   CBC    Component Value Date/Time   WBC 9.1 04/20/2020 0253   RBC 3.03 (L) 04/20/2020 0253   HGB 8.3 (L) 04/20/2020 0253   HGB 15.8 04/24/2018 1148   HCT 26.7 (L) 04/20/2020 0253   HCT 45.9 04/24/2018 1148   PLT 288 04/20/2020 0253   PLT 281 04/24/2018 1148   MCV 88.1 04/20/2020 0253   MCV 89 04/24/2018 1148   MCH 27.4 04/20/2020 0253   MCHC 31.1 04/20/2020 0253   RDW 15.9 (H) 04/20/2020 0253   RDW 14.2 04/24/2018 1148   LYMPHSABS 0.4 (L) 04/02/2020 0429   LYMPHSABS 1.1 04/24/2018 1148   MONOABS 2.2 (H) 04/02/2020 0429   EOSABS 0.0 04/02/2020 0429   EOSABS 0.2 04/24/2018 1148   BASOSABS 0.1 04/02/2020 0429   BASOSABS 0.1 04/24/2018 1148   Assessment/Plan:  1. AKI, in setting of cardiogenic shock, pressors, s/p LVAD 03/16/2020. CRRT initiated on 03/25/20 and stopped 04/12/20 since he is off of pressors.Tolerated hisfirst intermittent HD 04/14/20 1. Tolerated HD for the last 3 sessions this week.  2. Plan for HD tomorrow but may need to go to 4 days a week if volume becomes an issue 3. No heparin with HD on systemic anticoagulation 2. Hyperkalemia-improved with HD and lokelma.  Will stop lokelma. 3. Vascular access-temp cath removed and RIJ TDC placed 04/16/20 by IR. 1. He will need an AVF/AVG when he is more stable. 4. sCHF s/p LVAD 04/03/2020 5. A fib s/p MAZE and LAA clipping 4/8 6. R MCA ischemic stroke- large, seen on CT on 03/27/20. 7. Left hemothorax s/p VATS 04/02/2020. 8. Anemia-s/p transfusion 04/14/20 without  significant improvement of Hgb. Receiving blood again today.Continue to follow.  Will add ESA. 9. MVP s/p MV repair   Donetta Potts, MD [facility] [phone]

## 2020-04-21 LAB — RENAL FUNCTION PANEL
Albumin: 2.3 g/dL — ABNORMAL LOW (ref 3.5–5.0)
Anion gap: 15 (ref 5–15)
BUN: 112 mg/dL — ABNORMAL HIGH (ref 8–23)
CO2: 25 mmol/L (ref 22–32)
Calcium: 8.6 mg/dL — ABNORMAL LOW (ref 8.9–10.3)
Chloride: 94 mmol/L — ABNORMAL LOW (ref 98–111)
Creatinine, Ser: 7.14 mg/dL — ABNORMAL HIGH (ref 0.61–1.24)
GFR calc Af Amer: 8 mL/min — ABNORMAL LOW (ref >60)
GFR calc non Af Amer: 7 mL/min — ABNORMAL LOW (ref >60)
Glucose, Bld: 149 mg/dL — ABNORMAL HIGH (ref 70–99)
Phosphorus: 4.8 mg/dL — ABNORMAL HIGH (ref 2.5–4.6)
Potassium: 4.3 mmol/L (ref 3.5–5.1)
Sodium: 134 mmol/L — ABNORMAL LOW (ref 135–145)

## 2020-04-21 LAB — GLUCOSE, CAPILLARY
Glucose-Capillary: 129 mg/dL — ABNORMAL HIGH (ref 70–99)
Glucose-Capillary: 136 mg/dL — ABNORMAL HIGH (ref 70–99)
Glucose-Capillary: 153 mg/dL — ABNORMAL HIGH (ref 70–99)
Glucose-Capillary: 156 mg/dL — ABNORMAL HIGH (ref 70–99)
Glucose-Capillary: 179 mg/dL — ABNORMAL HIGH (ref 70–99)
Glucose-Capillary: 181 mg/dL — ABNORMAL HIGH (ref 70–99)
Glucose-Capillary: 193 mg/dL — ABNORMAL HIGH (ref 70–99)

## 2020-04-21 LAB — CBC
HCT: 25.6 % — ABNORMAL LOW (ref 39.0–52.0)
Hemoglobin: 8.1 g/dL — ABNORMAL LOW (ref 13.0–17.0)
MCH: 27.5 pg (ref 26.0–34.0)
MCHC: 31.6 g/dL (ref 30.0–36.0)
MCV: 86.8 fL (ref 80.0–100.0)
Platelets: 300 10*3/uL (ref 150–400)
RBC: 2.95 MIL/uL — ABNORMAL LOW (ref 4.22–5.81)
RDW: 15.9 % — ABNORMAL HIGH (ref 11.5–15.5)
WBC: 9.3 10*3/uL (ref 4.0–10.5)
nRBC: 0 % (ref 0.0–0.2)

## 2020-04-21 LAB — PROTIME-INR
INR: 2.2 — ABNORMAL HIGH (ref 0.8–1.2)
Prothrombin Time: 23.4 seconds — ABNORMAL HIGH (ref 11.4–15.2)

## 2020-04-21 LAB — MAGNESIUM: Magnesium: 2.9 mg/dL — ABNORMAL HIGH (ref 1.7–2.4)

## 2020-04-21 LAB — LACTATE DEHYDROGENASE: LDH: 227 U/L — ABNORMAL HIGH (ref 98–192)

## 2020-04-21 MED ORDER — WARFARIN 0.5 MG HALF TABLET
0.5000 mg | ORAL_TABLET | Freq: Once | ORAL | Status: AC
  Administered 2020-04-21: 0.5 mg via ORAL
  Filled 2020-04-21 (×3): qty 1

## 2020-04-21 NOTE — Progress Notes (Signed)
LVAD Coordinator Rounding Note:  HM III LVAD implanted on 03/23/2020 with MAZE procedure + LAA clipping  by Dr Orvan Seen under destination therapy criteria due to advanced age. Primary Heart Failure Cardiologist is Dr. Haroldine Laws.   PT working with patient getting him OOB. Pt resistant saying we are "pushing" him too hard, he is "too weak", wants to be left alone. With much encouragement and 2 person assistance, pt stood and was rolled to bedside chair.   Vital signs: Temp: 98.0 HR: 85 - afib Cuff: 129/77 (95) Doppler: 100 O2 Sat: 95% on RA Wt: 253.3>262.3>260.8>259.9>237.8.Marland Kitchen..229>210>225>233.6>217.5>224.8 lbs   LVAD interrogation reveals:  Speed: 5300 Flow: 4.2 Power: 3.8w  PI: 3.9  Alarms: none  Events: none Hematocrit: 26  Fixed speed: 5300 Low speed limit: 5000   Drive Line:CDI. Anchor secure. Dressing changes twice weekly (Monday / Thursday) dressing changes per nurse champion or VAD coordinator.  Next dressing change due 04/21/20.   Labs:  LDH trend: 360>513>482>...283>273>259>243>262>271>241>253>224>225  INR trend: 1.3>6.2>9>4.4>6.2>2.2....2.8>3.1>2.4>2.3>2.5>2.1>2.1>2.4>2.1>2.2  CR: 3.57>3.98>3.18>2.44...>4.3>3.6>2.53>2.25>2.16>1.88>1.83>1.88>2.3>5.52>4.54>6.3>4.34>6.4>7.14  Anticoagulation Plan: -INR Goal: 1.8 - 2.3  -ASA Dose: 81 mg - will continue due to hx of CVA per Dr. Haroldine Laws  Respiratory: - extubated 4/9/2 - re-intubated 03/21/2020 s/p left hemothorax with cardiac arrest - extubated 04/06/20  Renal:  - CVVH started 03/25/20 - Intermittent HD dialysis started 04/14/20 - 04/16/20 Tunneled HD cath placed  Neuro: - Right MCA CVA on 03/27/20  Nitric Oxide: off 03/23/20  Blood Products:  Intraop: 6 FFP  DDAVP  420 cell saver  03/19/2020>>4 FFP 03/21/20>> 1 RBC 03/22/20>>1 RBC  Intra-op: 03/24/2020>>2 PCs      2 FFP Post-op: 03/23/2020>>3 PC's, 2 FFP 04/14/20>>1 PC's 04/15/20>> 1 PC's 04/18/20>>1 PC's  Cultures:  -03/25/20- blood cultures>>negative - 03/21/2020-blood  cultures>>negative - 04/03/20 - respiratory culture>>neg   Device: -St [name] -Therapies: VF 194 - therapy on  Drips: - Milrinone 0.125 mcg/kg/min >> off 04/09/20 - Levo 1 mcg/min>> off 04/10/20 - Amiodarone 30 mg/hr-off - Tube feed: 50 mL/hr   Adverse Events on VAD: >>03/25/20 CVVH Started >>03/13/2020 left hemothorax; cardiac arrest >>04/14/20 HD  Pt Education:  1. Pt not agreeable to VAD teaching this am. Pt very resistant to any direction today; became verbally abusive at times with PT. 2. No family at bedside.  Plan/Recommendations:  1. Call VAD coordinator for any equipment or drive line issues.  2. Dressing changes Monday / Thursday per nurse champion or VAD coordinator.   Orlando Penner RN VAD Coordinator  Office: [phone]  24/7 Pager: [phone]

## 2020-04-21 NOTE — Progress Notes (Signed)
Physical Therapy Treatment Patient Details Name: [name] [name] MRN: [id number] DOB: Sep 03, 1951 Today's Date: 04/21/2020    History of Present Illness 69 yo male with history of CAD with prior stenting of the LAD, Diagonal and OM in an outside hospital, ischemic cardiomyopathy with ICD in place, chronic systolic CHF, MVP s/p mitral valve repair, permanent atrial fib on chronic anticoagulation, HTN, HLD, DM, depression admitted to [facility] with progressive weakness, fatigue, dyspnea and chest pressure. Troponin elevated at 0.06. He was transferred to [facility] for cardiac cath, showing severe 3 vessel disease. Plan for CABG but pt with AKI. Pt underwent multiple tooth extraction on 4/6 and LVAD placement on 4/8. Postop day 7 patient was noted to be agitated the CT head was obtained showing a large posterior right MCA infarct. Pt with arrest on 4/20 and reintubated, extubated 4/25. Pt on CRRT until 04/12/20 when transitioned to HD.Tunnelled HD cath 5/5    PT Comments    Pt supine on arrival and not jovial this session despite country music played for him. Pt aware of situation and controller but continues to have difficulty recognizing need to move and perform standing to strengthen legs and decrease effort and fatigue with mobility. Pt able to stand with stedy and transfer to chair. Pt educated for sternal precautions, need to progress to power transitions and improve standing tolerance.   94% on RA 137/87 (103) HR 83-87 Speed 5350, power 3.8, flow 3. 7, pulse index 6. 4    Follow Up Recommendations  CIR;Supervision/Assistance - 24 hour     Equipment Recommendations  Rolling walker with 5" wheels;3in1 (PT);Wheelchair (measurements PT)    Recommendations for Other Services       Precautions / Restrictions Precautions Precautions: Fall;Sternal Precaution Comments: LVAD, cortrak    Mobility  Bed Mobility Overal bed mobility: Needs Assistance Bed Mobility: Rolling;Sidelying to  Sit Rolling: Mod assist Sidelying to sit: Mod assist       General bed mobility comments: cues for technique and sequence with pt able to bend knees to assist with rolling with assist to bring legs off surface and elevate trunk  Transfers Overall transfer level: Needs assistance   Transfers: Sit to/from Stand;Stand Pivot Transfers Sit to Stand: +2 physical assistance;Mod assist;Max assist         General transfer comment: initial stand from bed with stedy max cues for hand placement with max +2 assist and pt not assisting significantly. Bed height elevated and pt reassured and educated with pt able to then rise from bed with use of belt and pad with mod +2 assist. Pt stood 35 sec in stedy then sat on stedy pad. Pivot via stedy from bed to chair. stand within stedy with min +2 assist  Ambulation/Gait             General Gait Details: unable   Stairs             Wheelchair Mobility    Modified Rankin (Stroke Patients Only) Modified Rankin (Stroke Patients Only) Pre-Morbid Rankin Score: No symptoms Modified Rankin: Severe disability     Balance Overall balance assessment: Needs assistance   Sitting balance-Leahy Scale: Fair Sitting balance - Comments: pt sitting EOB with flexed trunk and tendency for left lean   Standing balance support: Bilateral upper extremity supported Standing balance-Leahy Scale: Poor Standing balance comment: stood in stedy  Cognition Arousal/Alertness: Awake/alert Behavior During Therapy: Flat affect Overall Cognitive Status: Impaired/Different from baseline Area of Impairment: Orientation;Memory;Safety/judgement;Following commands;Attention                 Orientation Level: Disoriented to;Time Current Attention Level: Sustained Memory: Decreased short-term memory;Decreased recall of precautions Following Commands: Follows one step commands inconsistently;Follows one step commands with  increased time Safety/Judgement: Decreased awareness of deficits   Problem Solving: Slow processing;Decreased initiation;Difficulty sequencing;Requires verbal cues General Comments: pt less alert and continues to become agitated with trying to stand on his legs reporting pain or with any movement of Cortrak due to pain. Pt educated and reassured      Exercises General Exercises - Lower Extremity Long Arc Quad: AROM;Seated;15 reps Hip Flexion/Marching: Both;Seated;AROM;15 reps    General Comments        Pertinent Vitals/Pain Pain Score: 6  Pain Location: bil LEs with standing and nose with cortrak Pain Descriptors / Indicators: Discomfort;Grimacing;Guarding Pain Intervention(s): Limited activity within patient's tolerance;Monitored during session;Repositioned    Home Living                      Prior Function            PT Goals (current goals can now be found in the care plan section) Acute Rehab PT Goals Time For Goal Achievement: 05/05/20 Progress towards PT goals: Progressing toward goals    Frequency    Min 3X/week      PT Plan Current plan remains appropriate    Co-evaluation              AM-PAC PT "6 Clicks" Mobility   Outcome Measure  Help needed turning from your back to your side while in a flat bed without using bedrails?: A Lot Help needed moving from lying on your back to sitting on the side of a flat bed without using bedrails?: A Lot Help needed moving to and from a bed to a chair (including a wheelchair)?: Total Help needed standing up from a chair using your arms (e.g., wheelchair or bedside chair)?: Total Help needed to walk in hospital room?: Total Help needed climbing 3-5 steps with a railing? : Total 6 Click Score: 8    End of Session Equipment Utilized During Treatment: Gait belt Activity Tolerance: Patient tolerated treatment well Patient left: in chair;with call bell/phone within reach;with nursing/sitter in room Nurse  Communication: Mobility status;Precautions PT Visit Diagnosis: Muscle weakness (generalized) (M62.81);Other abnormalities of gait and mobility (R26.89);Other symptoms and signs involving the nervous system (R29.898)     Time: 9798-9211 PT Time Calculation (min) (ACUTE ONLY): 28 min  Charges:  $Therapeutic Exercise: 8-22 mins $Therapeutic Activity: 8-22 mins                     Turner Kunzman P, PT Acute Rehabilitation Services Pager: [phone] Office: [place] 04/21/2020, 1:54 PM

## 2020-04-21 NOTE — Progress Notes (Signed)
Dialysis called for patient but did not realize patient with LVAD, no trained nurse to care for patiant and LVAD coordinator not available.  They will do patient dialysis on 2C at the bedside

## 2020-04-21 NOTE — Progress Notes (Addendum)
Patient ID: [name] [name], male   DOB: Jan 20, 1951, 69 y.o.   MRN: [id number]    Advanced Heart Failure Rounding Note   Subjective:    - S/p HM3 VAD 4/8 w MAZE procedure + LAA clipping. - Extubated 4/9 - 4/12 LVAD speed increased to 5400 --> echo moderate-severe RV dysfunction, septum mildly left shifted, trivial pericardial effusion.  LVAD speed cut back to 5300.  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  - 4/20 developed hemothorax requiring emergent CT placement and eventual VATS - 4/21 EEG done given concerns for posturing and c/w moderate to severe diffuse encephalopathy. No seizures or epileptiform discharges were seen  - 04/03/20 Pump Stop --> controller change out. CT of head unchanged R MCA CVA - 04/06/20 Extubated - 5/1 CRRT stopped. Had first iHD 5/3.  - 5/4 1uRBC - 5/6 1u RBC\  Denies pain. Denies shortness of breath. Denies abdominal pain.    VAD Interrogation  Flow 4.3Speed 5300, Power 4  Pulse Index 3.4 VAD interrogated personally. Parameters stable.   Objective:   Weight Range:  Vital Signs:   Temp:  [97.6 F (36.4 C)-98.4 F (36.9 C)] 98 F (36.7 C) (05/10 0300) Pulse Rate:  [60-93] 90 (05/10 0700) Resp:  [9-34] 9 (05/10 0500) BP: (91-133)/(69-95) 107/91 (05/10 0700) SpO2:  [92 %-97 %] 96 % (05/10 0700) Weight:  [102 kg] 102 kg (05/10 0500) Last BM Date: 04/20/20  Weight change: Filed Weights   04/19/20 0600 04/20/20 0030 04/21/20 0500  Weight: 104.1 kg 98.7 kg 102 kg    Intake/Output:   Intake/Output Summary (Last 24 hours) at 04/21/2020 0753 Last data filed at 04/21/2020 0700 Gross per 24 hour  Intake 760 ml  Output --  Net 760 ml      Physical Exam: GENERAL: In bed. No acute distress. HEENT: normal Cortrak in place NECK: Supple, JVP 6-7  .  2+ bilaterally, no bruits.  No lymphadenopathy or thyromegaly appreciated.   CARDIAC:  Mechanical heart sounds with LVAD hum present.  LUNGS:  Clear to auscultation bilaterally.   ABDOMEN:  Soft, round, nontender, positive bowel sounds x4.     LVAD exit site: well-healed and incorporated.  Dressing dry and intact.  No erythema or drainage.  Stabilization device present and accurately applied.  Driveline dressing is being changed daily per sterile technique. EXTREMITIES:  Warm and dry, no cyanosis, clubbing, rash or edema  NEUROLOGIC:  Alert and oriented x 3.  No aphasia.  No dysarthria.  Affect flat       Telemetry: A Fib with PVCs   Labs: Basic Metabolic Panel: Recent Labs  Lab 04/17/20 0043 04/17/20 0043 04/18/20 0259 04/18/20 0259 04/19/20 0239 04/20/20 0253 04/21/20 0237  NA 133*  --  135  --  135 135 134*  K 4.0  --  4.1  --  3.9 3.8 4.3  CL 95*  --  95*  --  94* 94* 94*  CO2 24  --  25  --  26 26 25   GLUCOSE 191*  --  144*  --  75 187* 149*  BUN 57*  --  89*  --  53* 82* 112*  CREATININE 4.34*  --  6.40*  --  4.37* 5.80* 7.14*  CALCIUM 8.0*   < > 8.2*   < > 8.2* 8.5* 8.6*  MG 2.2  --  2.5*  --  2.3 2.7* 2.9*  PHOS 5.9*  --  5.8*  --  5.1* 5.2*  4.8*   < > = values in this interval not displayed.    Liver Function Tests: Recent Labs  Lab 04/17/20 0043 04/18/20 0259 04/19/20 0239 04/20/20 0253 04/21/20 0237  ALBUMIN 2.4* 2.3* 2.3* 2.3* 2.3*   No results for input(s): LIPASE, AMYLASE in the last 168 hours. No results for input(s): AMMONIA in the last 168 hours.  CBC: Recent Labs  Lab 04/17/20 1254 04/18/20 0259 04/19/20 0239 04/20/20 0253 04/21/20 0237  WBC 8.8 7.8 9.3 9.1 9.3  HGB 7.4* 7.2* 8.6* 8.3* 8.1*  HCT 23.5* 23.0* 26.8* 26.7* 25.6*  MCV 89.7 88.8 88.4 88.1 86.8  PLT 252 261 281 288 300    Cardiac Enzymes: No results for input(s): CKTOTAL, CKMB, CKMBINDEX, TROPONINI in the last 168 hours.  BNP: BNP (last 3 results) Recent Labs    04/02/20 2342 04/10/20 0132 04/17/20 0043  BNP 332.7* 267.2* 582.1*    ProBNP (last 3 results) No results for input(s): PROBNP in the last 8760 hours.    Other  results:  Imaging: DG Abd 1 View  Result Date: 04/19/2020 CLINICAL DATA:  Nausea and vomiting EXAM: ABDOMEN - 1 VIEW COMPARISON:  April 07, 2020 FINDINGS: There is contrast in the colon. There is no bowel dilatation or air-fluid level to suggest bowel obstruction. No free air. Feeding tube tip is in the third portion of the duodenum. There are phleboliths in the pelvis. Lung bases are clear. Note that there is a left ventricular assist device present. IMPRESSION: Feeding tube tip in third portion of duodenum. No bowel dilatation or air-fluid level to suggest bowel obstruction. No free air. Lung bases clear. Electronically Signed   By: Lowella Grip III M.D.   On: 04/19/2020 11:42     Medications:     Scheduled Medications: . sodium chloride   Intravenous Once  . aspirin  81 mg Per Tube Daily  . atorvastatin  80 mg Per Tube q1800  . B-complex with vitamin C  1 tablet Per Tube Daily  . bisacodyl  10 mg Oral Daily   Or  . bisacodyl  10 mg Rectal Daily  . Chlorhexidine Gluconate Cloth  6 each Topical Daily  . citalopram  10 mg Per Tube Daily  . darbepoetin (ARANESP) injection - NON-DIALYSIS  100 mcg Subcutaneous Q Tue-1800  . feeding supplement (NEPRO CARB STEADY)  237 mL Oral BID BM  . feeding supplement (PRO-STAT SUGAR FREE 64)  30 mL Per Tube BID  . influenza vaccine adjuvanted  0.5 mL Intramuscular Tomorrow-1000  . insulin aspart  0-24 Units Subcutaneous Q4H  . insulin aspart  6 Units Subcutaneous Q4H  . insulin glargine  24 Units Subcutaneous BID  . pantoprazole (PROTONIX) IV  40 mg Intravenous Daily  . sodium chloride flush  10-40 mL Intracatheter Q12H  . Warfarin - Pharmacist Dosing Inpatient   Does not apply q1600    Infusions: . sodium chloride Stopped (04/15/20 1731)  . feeding supplement (NEPRO CARB STEADY) 1,000 mL (04/20/20 1422)  . norepinephrine (LEVOPHED) Adult infusion Stopped (04/11/20 2215)    PRN Medications: sodium chloride, acetaminophen, dextrose,  hydrALAZINE, levalbuterol, ondansetron (ZOFRAN) IV, oxyCODONE, promethazine, sodium chloride flush, sorbitol, traMADol   Assessment/Plan:   1. Acute on chronic systolic HF -> cardiogenic shock-> S/p HM3 LVAD - Echo 2016 EF 30-35% - Echo 1/21 EF 25% - Admitted with NYHA IV symptoms and AKI with attempts at diuresis. Initial co-ox 39% - Echo this admit: EF 10-15% moderate RV dysfunction - [facility] cath 3/21 with severe  3v CAD and low output with CI 1.7.  - Initially planned for CABG but given need for re-do sternotomy, relatively poor targets and longstanding low EF, VAD felt to be better option  - S/p HM3 VAD 4/8 -Maps stable.  -  Continue Midodrine 5 tid. Maps 90s but suspect will come down with HD today.  - Now off CVVH. Now on iHD - HD today per nephrology.   2. VAD - VAD interrogated personally. Parameters stable. - Had Pump Stop 04/03/20. Controller changed out with old controller sent back to [facility] for evaluation.  - on aspirin 81 mg daily + coumadin.  - LDH 227 - INR 2.2 . We have lowered INR goal to 1.8-2.3 given anemia. Discussed dosing with PharmD personally.  3. CAD with unstable angina - s/p previous PCI. - cath 03/11/2020 with severe 3v CAD - now s/p VAD on 04/06/2020   - No s/s ischemia  4. AKI on CKD 3a -> ESRD - Nephrology consulted. Started CVVHD 4/13.    - Renal US 4/5 unremarkable.  - Hopefully renal function will recover but Renal team not optimistic - He is now off NE w/ stable MAP.  - Off CVVH.  1st iHD 5/3.  -  Tunnel HD cath placed 5/5. Appreciate IR --HD today.   5. Permanent AF - now s/p MAZE + LAA Clipping 4/8   - Rate controlled.  - On coumadin. INR 2.1  6. MVP s/p MV repair - On admit with recurrent severe MR on echo and with huge v-waves on PCWP tracing - s/p VAD  7. Hemothoax - s/p CT placement and VATs on 4/22 - CTs pulled 4/26 - stable  8. ID/leukocytosis - 4/11 procalcitonin 1.96  - Started empiric coverage for PNA with  vancomycin/cefepime for 7 days. Stopped 4/17 - Given recent VATs and high-dose pressor requirements he was treated with broad spectrum abx, linezolid and cefepime (completed course 4/28)  - WBC normal today at 9.1  9. Acute Hypoxic Respiratory Failure - Re-intubated 4/20 with hemothorax- - Extubated 4/25 - On room air.   10. Malnutrition  - Albumin 2.3 - Remains on tube feeds.  - Speech following--> placed on thin liquid dysphagia 1  11. CVA/anoxic brain injury - 03/27/20 CT large posterior MCA ischemic infarct, possibly from atrial fibrillation.  INR was supratherapeutic when CVA found.  Stable LDH, do not think partial pump thrombosis is the culprit. - Still with some L weakness and inattention but improving  - PT/OT/speech following.  - CIR consulted.    12. Anemia, post-op  - Received 2u RBC 04/05/20 + 1 Unit on 5/3 + 1 unit 5/4 + 1u 5/7 - hgb stable 8.1 today - lower INR goal to 1.8-2.3  13. Hyperkalemia - resolved  - Continue iHD  - can use Lokelma PRN on non HD days if needed   14. DM2, poorly controlled - hgbA1c 11.1% - continue insulin   15. Agitation/ Depression  - Celexa 10 mg daily added. - Monitor   16. Deconditioning - Continue PT/OT    17. Lower ab pain - kub ok. Bladder scan ok  - Resolved.   Continue aggressive PT/OT. Length of Stay: [place], NP  7:53 AM  04/21/2020   Patient seen and examined with the above-signed Advanced Practice Provider and/or Housestaff. I personally reviewed laboratory data, imaging studies and relevant notes. I independently examined the patient and formulated the important aspects of the plan. I have edited the note to reflect  any of my changes or salient points. I have personally discussed the plan with the patient and/or family.  More alert and interactive today but still very weak. Ab pain and nausea improved. MAPs are running higher. Remains anuric. Due for iHD today. VAD interrogated personally. Parameters  stable. INR 2.2  General:  Lying in bed NAD.  HEENT: normal  Neck: supple. JVP not elevated.  Carotids 2+ bilat; no bruits. No lymphadenopathy or thryomegaly appreciated. LSC catheter Cor: LVAD hum.  Lungs: Clear. Decreased left base Abdomen: obese soft, nontender, non-distended. No hepatosplenomegaly. No bruits or masses. Good bowel sounds. Driveline site clean. Anchor in place.  Extremities: no cyanosis, clubbing, rash. Warm no edema  Neuro: alert & oriented x 3.weak  Due for iHD today. Will move to SDU. Needs very aggressive PT/OT with hopes we can get him to CIR eventually as it will be very hard to place him in SNF with need for HD and VAD care. May be able to wean midodrine depending on what BP does with HD. VAD interrogated personally. Parameters stable.. INR 2.2  Discussed dosing with PharmD personally.  Glori Bickers, MD  9:28 PM

## 2020-04-21 NOTE — Plan of Care (Signed)
  Problem: Education: Goal: Knowledge of General Education information will improve Description: Including pain rating scale, medication(s)/side effects and non-pharmacologic comfort measures Outcome: Progressing   Problem: Health Behavior/Discharge Planning: Goal: Ability to manage health-related needs will improve Outcome: Progressing   Problem: Nutrition: Goal: Adequate nutrition will be maintained Outcome: Progressing   Problem: Elimination: Goal: Will not experience complications related to bowel motility Outcome: Progressing   Problem: Safety: Goal: Ability to remain free from injury will improve Outcome: Progressing   Problem: Skin Integrity: Goal: Risk for impaired skin integrity will decrease Outcome: Progressing

## 2020-04-21 NOTE — Progress Notes (Signed)
Patient ID: [name] [name], male   DOB: 1951/06/07, 69 y.o.   MRN: [id number] S: No events overnight   O:BP 108/79   Pulse 77   Temp 98 F (36.7 C) (Axillary)   Resp (!) 9   Ht 6' (1.829 m)   Wt 102 kg Comment: with VAD controller  SpO2 97%   BMI 30.50 kg/m   Intake/Output Summary (Last 24 hours) at 04/21/2020 0647 Last data filed at 04/21/2020 0600 Gross per 24 hour  Intake 710 ml  Output --  Net 710 ml   Intake/Output: I/O last 3 completed shifts: In: 0263 [P.O.:120; I.V.:10; NG/GT:1476] Out: 65 [Stool:65]  Intake/Output this shift:  Total I/O In: 650 [P.O.:40; NG/GT:610] Out: -  Weight change: 3.3 kg Gen: resting in bed in NAD CVS: mechanical hum Resp: cta Abd: +BS< soft, nt/nd Ext: trace edema  Recent Labs  Lab 04/15/20 0417 04/16/20 0450 04/17/20 0043 04/18/20 0259 04/19/20 0239 04/20/20 0253 04/21/20 0237  NA 137 134* 133* 135 135 135 134*  K 4.4 5.0 4.0 4.1 3.9 3.8 4.3  CL 96* 96* 95* 95* 94* 94* 94*  CO2 27 25 24 25 26 26 25   GLUCOSE 71 76 191* 144* 75 187* 149*  BUN 71* 97* 57* 89* 53* 82* 112*  CREATININE 4.54* 6.32* 4.34* 6.40* 4.37* 5.80* 7.14*  ALBUMIN 1.9* 2.0* 2.4* 2.3* 2.3* 2.3* 2.3*  CALCIUM 8.1* 8.2* 8.0* 8.2* 8.2* 8.5* 8.6*  PHOS 5.2* 6.9* 5.9* 5.8* 5.1* 5.2* 4.8*   Liver Function Tests: Recent Labs  Lab 04/19/20 0239 04/20/20 0253 04/21/20 0237  ALBUMIN 2.3* 2.3* 2.3*   No results for input(s): LIPASE, AMYLASE in the last 168 hours. No results for input(s): AMMONIA in the last 168 hours. CBC: Recent Labs  Lab 04/17/20 1254 04/17/20 1254 04/18/20 0259 04/18/20 0259 04/19/20 0239 04/20/20 0253 04/21/20 0237  WBC 8.8   < > 7.8   < > 9.3 9.1 9.3  HGB 7.4*   < > 7.2*   < > 8.6* 8.3* 8.1*  HCT 23.5*   < > 23.0*   < > 26.8* 26.7* 25.6*  MCV 89.7  --  88.8  --  88.4 88.1 86.8  PLT 252   < > 261   < > 281 288 300   < > = values in this interval not displayed.   Cardiac Enzymes: No results for input(s): CKTOTAL, CKMB,  CKMBINDEX, TROPONINI in the last 168 hours. CBG: Recent Labs  Lab 04/20/20 1549 04/20/20 2032 04/20/20 2211 04/20/20 2350 04/21/20 0419  GLUCAP 123* 107* 118* 153* 129*    Iron Studies: No results for input(s): IRON, TIBC, TRANSFERRIN, FERRITIN in the last 72 hours. Studies/Results: DG Abd 1 View  Result Date: 04/19/2020 CLINICAL DATA:  Nausea and vomiting EXAM: ABDOMEN - 1 VIEW COMPARISON:  April 07, 2020 FINDINGS: There is contrast in the colon. There is no bowel dilatation or air-fluid level to suggest bowel obstruction. No free air. Feeding tube tip is in the third portion of the duodenum. There are phleboliths in the pelvis. Lung bases are clear. Note that there is a left ventricular assist device present. IMPRESSION: Feeding tube tip in third portion of duodenum. No bowel dilatation or air-fluid level to suggest bowel obstruction. No free air. Lung bases clear. Electronically Signed   By: Lowella Grip III M.D.   On: 04/19/2020 11:42   . sodium chloride   Intravenous Once  . aspirin  81 mg Per Tube Daily  . atorvastatin  80 mg Per Tube q1800  . B-complex with vitamin C  1 tablet Per Tube Daily  . bisacodyl  10 mg Oral Daily   Or  . bisacodyl  10 mg Rectal Daily  . Chlorhexidine Gluconate Cloth  6 each Topical Daily  . citalopram  10 mg Per Tube Daily  . darbepoetin (ARANESP) injection - NON-DIALYSIS  100 mcg Subcutaneous Q Tue-1800  . feeding supplement (NEPRO CARB STEADY)  237 mL Oral BID BM  . feeding supplement (PRO-STAT SUGAR FREE 64)  30 mL Per Tube BID  . influenza vaccine adjuvanted  0.5 mL Intramuscular Tomorrow-1000  . insulin aspart  0-24 Units Subcutaneous Q4H  . insulin aspart  6 Units Subcutaneous Q4H  . insulin glargine  24 Units Subcutaneous BID  . pantoprazole (PROTONIX) IV  40 mg Intravenous Daily  . sodium chloride flush  10-40 mL Intracatheter Q12H  . Warfarin - Pharmacist Dosing Inpatient   Does not apply q1600    BMET    Component Value Date/Time    NA 134 (L) 04/21/2020 0237   NA 132 (L) 03/07/2020 1501   K 4.3 04/21/2020 0237   CL 94 (L) 04/21/2020 0237   CO2 25 04/21/2020 0237   GLUCOSE 149 (H) 04/21/2020 0237   BUN 112 (H) 04/21/2020 0237   BUN 16 03/07/2020 1501   CREATININE 7.14 (H) 04/21/2020 0237   CREATININE 1.29 (H) 12/10/2016 1011   CALCIUM 8.6 (L) 04/21/2020 0237   GFRNONAA 7 (L) 04/21/2020 0237   GFRNONAA 57 (L) 01/28/2015 1558   GFRAA 8 (L) 04/21/2020 0237   GFRAA 66 01/28/2015 1558   CBC    Component Value Date/Time   WBC 9.3 04/21/2020 0237   RBC 2.95 (L) 04/21/2020 0237   HGB 8.1 (L) 04/21/2020 0237   HGB 15.8 04/24/2018 1148   HCT 25.6 (L) 04/21/2020 0237   HCT 45.9 04/24/2018 1148   PLT 300 04/21/2020 0237   PLT 281 04/24/2018 1148   MCV 86.8 04/21/2020 0237   MCV 89 04/24/2018 1148   MCH 27.5 04/21/2020 0237   MCHC 31.6 04/21/2020 0237   RDW 15.9 (H) 04/21/2020 0237   RDW 14.2 04/24/2018 1148   LYMPHSABS 0.4 (L) 04/02/2020 0429   LYMPHSABS 1.1 04/24/2018 1148   MONOABS 2.2 (H) 04/02/2020 0429   EOSABS 0.0 04/02/2020 0429   EOSABS 0.2 04/24/2018 1148   BASOSABS 0.1 04/02/2020 0429   BASOSABS 0.1 04/24/2018 1148   Assessment/Plan:  1. AKI, in setting of cardiogenic shock, pressors, s/p LVAD 04/04/2020. CRRT initiated on 03/25/20 and stopped 04/12/20.Tolerated hisfirst intermittent HD 04/14/20 1. Tolerated HD for the last 3 sessions this week.  2. Plan for HD today but may need to go to 4 days a week if volume becomes an issue 3. No heparin with HD on systemic anticoagulation 2. Hyperkalemia-improved with HD and lokelma.  stopped lokelma. 3. Vascular access-temp cath removed and RIJ TDC placed 04/16/20 by IR. 1. He will need an AVF/AVG when he is more stable- possibly this week. 4. sCHF s/p LVAD 03/31/2020 5. A fib s/p MAZE and LAA clipping 4/8 6. R MCA ischemic stroke- large, seen on CT on 03/27/20. 7. Left hemothorax s/p VATS 03/27/2020. 8. Anemia-s/p transfusion 04/14/20 and 5/7.Continue to  follow, transfuse PRN.  on ESA.    Louis Meckel  [employer] [phone]

## 2020-04-21 NOTE — Progress Notes (Signed)
CSW attempted to meet at bedside with patient although patient was asleep. CSW did not want to wake. No family at bedside at the time of visit. CSW continues to follow throughout implant hospitalization. [name], [place], [place]

## 2020-04-21 NOTE — Progress Notes (Signed)
  Speech Language Pathology Treatment: Dysphagia;Cognitive-Linquistic  Patient Details Name: [name] [name] MRN: [id number] DOB: [birth year] Today's Date: 04/21/2020 Time: 4098-1191 SLP Time Calculation (min) (ACUTE ONLY): 15 min  Assessment / Plan / Recommendation Clinical Impression  Pt more attentive to functional task, initiated self feeding x1, sustained attention to oral intake for 10 minutes, with opportunities to make requests and choices fulfilled with verbal cueing. Pt participating in basic reasoning conversation, but cannot surpass physical discomfort to carry out accurate verbal reasoning in a functional task.   Pt is tolerating thin liquids without incident and has not shown any interest in puree diet. When offered raisin bran, he accepted a bite, but did not enjoy it. Though he is missing dentition, his oral manipulation of the bolus is slow but complete and he again states that he eats regular textures at baseline. Will upgrade to mech soft with the hopes of enticing him with more palatable foods, as appetite and motivation are the main barriers at this time. Will f/u briefly to ensure tolerance of texture, though adequate intake is not anticipated.    HPI HPI: [name] [name] is a 69 year old gentleman with a history of ischemic cardiomyopathy who presented for an LVAD implantation and left atrial appendage clipping with sternal reconstruction on 4/8, extubated 4/9. Started CRRT on 4/78  He had a complicated postop course including a right MCA stroke on 4/15. Evaluated by SLP 4/17, started on a diet.  He had a cardiac arrest on 4/20 requiring reintubation until 4/25. BSE repeated 4/26, with recommendation for NPO. His mental status has been improving slowly for several days.      SLP Plan          Recommendations  Diet recommendations: Thin liquid;Dysphagia 3 (mechanical soft) Liquids provided via: Cup;Straw Medication Administration: Whole meds with liquid Supervision:  Patient able to self feed;Staff to assist with self feeding Compensations: Slow rate;Small sips/bites;Follow solids with liquid Postural Changes and/or Swallow Maneuvers: Seated upright 90 degrees                Follow up Recommendations: Inpatient Rehab SLP Visit Diagnosis: Dysphagia, unspecified (R13.10)       GO               [name] Baltimore, MA CCC-SLP  Acute Rehabilitation Services Pager [phone] Office [phone]  [name] [name] 04/21/2020, 9:36 AM

## 2020-04-21 NOTE — Progress Notes (Signed)
Assisted tele visit to patient with family member.  Ellowyn Rieves R, RN  

## 2020-04-21 NOTE — Progress Notes (Signed)
ANTICOAGULATION CONSULT NOTE  Pharmacy Consult for warfarin Indication: post op LVAD  / Aflutter  Allergies  Allergen Reactions  . Liraglutide Nausea And Vomiting  . Lisinopril Cough    Patient Measurements: Height: 6' (182.9 cm) Weight: 102 kg (224 lb 13.9 oz)(with VAD controller) IBW/kg (Calculated) : 77.6 Heparin Dosing Weight: 101.2 kg  Vital Signs: Temp: 98 F (36.7 C) (05/10 0300) Temp Source: Axillary (05/10 0300) BP: 118/87 (05/10 0900) Pulse Rate: 69 (05/10 0800)  Labs: Recent Labs    04/19/20 0239 04/19/20 0239 04/20/20 0253 04/21/20 0237  HGB 8.6*   < > 8.3* 8.1*  HCT 26.8*  --  26.7* 25.6*  PLT 281  --  288 300  LABPROT 21.4*  --  23.1* 23.4*  INR 1.9*  --  2.1* 2.2*  CREATININE 4.37*  --  5.80* 7.14*   < > = values in this interval not displayed.    Estimated Creatinine Clearance: 11.9 mL/min (A) (by C-G formula based on SCr of 7.14 mg/dL (H)).   Assessment: 25 YOM with atrial fibrillation on Eliquis PTA s/p R & L heart cath found to have 3v disease s/p  LVAD  Implant 4/8. Warfarin initiated on 4/9 after discussing with MD.  CT on 4/15 - found to have large right MCA stroke.   INR 2.2, within goal range. Hgb 8.1, Pltc with normal limits. LDH ~200s. No bleeding noted.   INR goal reduced 5/4 per HF team.    Goal of Therapy:  INR 1.8-2.3 Monitor platelets by anticoagulation protocol: Yes   Plan:  Warfarin 0.5 mg x 1 tonight Daily PT/INR.  Marguerite Olea, [facility] Clinical Pharmacist Phone [phone]  04/21/2020 9:45 AM

## 2020-04-21 NOTE — Progress Notes (Signed)
Patient adamantly states that he doesn't want anything "running down that tube in my nose".  Education provided that it is for nutrition, patient states he doesn't want it.  Will continue to monitor.

## 2020-04-22 ENCOUNTER — Inpatient Hospital Stay (HOSPITAL_COMMUNITY): Payer: Medicare HMO

## 2020-04-22 LAB — RENAL FUNCTION PANEL
Albumin: 2.3 g/dL — ABNORMAL LOW (ref 3.5–5.0)
Anion gap: 15 (ref 5–15)
BUN: 141 mg/dL — ABNORMAL HIGH (ref 8–23)
CO2: 25 mmol/L (ref 22–32)
Calcium: 8.6 mg/dL — ABNORMAL LOW (ref 8.9–10.3)
Chloride: 92 mmol/L — ABNORMAL LOW (ref 98–111)
Creatinine, Ser: 8.32 mg/dL — ABNORMAL HIGH (ref 0.61–1.24)
GFR calc Af Amer: 7 mL/min — ABNORMAL LOW (ref >60)
GFR calc non Af Amer: 6 mL/min — ABNORMAL LOW (ref >60)
Glucose, Bld: 143 mg/dL — ABNORMAL HIGH (ref 70–99)
Phosphorus: 6.3 mg/dL — ABNORMAL HIGH (ref 2.5–4.6)
Potassium: 4.9 mmol/L (ref 3.5–5.1)
Sodium: 132 mmol/L — ABNORMAL LOW (ref 135–145)

## 2020-04-22 LAB — GLUCOSE, CAPILLARY
Glucose-Capillary: 106 mg/dL — ABNORMAL HIGH (ref 70–99)
Glucose-Capillary: 107 mg/dL — ABNORMAL HIGH (ref 70–99)
Glucose-Capillary: 107 mg/dL — ABNORMAL HIGH (ref 70–99)
Glucose-Capillary: 118 mg/dL — ABNORMAL HIGH (ref 70–99)
Glucose-Capillary: 118 mg/dL — ABNORMAL HIGH (ref 70–99)

## 2020-04-22 LAB — CBC
HCT: 24 % — ABNORMAL LOW (ref 39.0–52.0)
Hemoglobin: 7.6 g/dL — ABNORMAL LOW (ref 13.0–17.0)
MCH: 27.4 pg (ref 26.0–34.0)
MCHC: 31.7 g/dL (ref 30.0–36.0)
MCV: 86.6 fL (ref 80.0–100.0)
Platelets: 292 10*3/uL (ref 150–400)
RBC: 2.77 MIL/uL — ABNORMAL LOW (ref 4.22–5.81)
RDW: 15.6 % — ABNORMAL HIGH (ref 11.5–15.5)
WBC: 8.1 10*3/uL (ref 4.0–10.5)
nRBC: 0 % (ref 0.0–0.2)

## 2020-04-22 LAB — MAGNESIUM: Magnesium: 3.1 mg/dL — ABNORMAL HIGH (ref 1.7–2.4)

## 2020-04-22 LAB — LACTATE DEHYDROGENASE: LDH: 215 U/L — ABNORMAL HIGH (ref 98–192)

## 2020-04-22 LAB — PROTIME-INR
INR: 2.4 — ABNORMAL HIGH (ref 0.8–1.2)
Prothrombin Time: 25.3 seconds — ABNORMAL HIGH (ref 11.4–15.2)

## 2020-04-22 MED ORDER — HEPARIN SODIUM (PORCINE) 1000 UNIT/ML IJ SOLN
INTRAMUSCULAR | Status: AC
  Administered 2020-04-22: 3200 [IU] via INTRAVENOUS
  Filled 2020-04-22: qty 4

## 2020-04-22 MED ORDER — CHLORHEXIDINE GLUCONATE CLOTH 2 % EX PADS
6.0000 | MEDICATED_PAD | Freq: Every day | CUTANEOUS | Status: DC
  Administered 2020-04-23 – 2020-04-26 (×3): 6 via TOPICAL

## 2020-04-22 MED ORDER — HEPARIN SODIUM (PORCINE) 1000 UNIT/ML IJ SOLN
3200.0000 [IU] | INTRAMUSCULAR | Status: DC | PRN
  Filled 2020-04-22 (×3): qty 3.2

## 2020-04-22 MED ORDER — CARVEDILOL 3.125 MG PO TABS
3.1250 mg | ORAL_TABLET | Freq: Two times a day (BID) | ORAL | Status: DC
  Administered 2020-04-22 – 2020-05-02 (×20): 3.125 mg via ORAL
  Filled 2020-04-22 (×20): qty 1

## 2020-04-22 MED ORDER — WARFARIN 0.5 MG HALF TABLET
0.5000 mg | ORAL_TABLET | Freq: Once | ORAL | Status: AC
  Administered 2020-04-22: 0.5 mg via ORAL
  Filled 2020-04-22: qty 1

## 2020-04-22 MED ORDER — DARBEPOETIN ALFA 100 MCG/0.5ML IJ SOSY
PREFILLED_SYRINGE | INTRAMUSCULAR | Status: AC
  Administered 2020-04-22: 100 ug via SUBCUTANEOUS
  Filled 2020-04-22: qty 0.5

## 2020-04-22 MED ORDER — RENA-VITE PO TABS
1.0000 | ORAL_TABLET | Freq: Every day | ORAL | Status: DC
  Administered 2020-04-22 – 2020-05-01 (×10): 1 via ORAL
  Filled 2020-04-22 (×10): qty 1

## 2020-04-22 NOTE — Progress Notes (Addendum)
HD nurse accompanied pt from HD to bedside. RN entered room and controller started to beep with message "power cable disconnected."  RN switched patient back to wall unit.  Amy Clegg at beside shortly after change over. RN advised of the alarm. RN will continue to monitor.

## 2020-04-22 NOTE — Progress Notes (Signed)
RN received call from [place] coordinator who advise pt should remain on wall due to controller "beeping" controller displaying  "controller disconnected."  Ok for patient to be transported to HD on batteries. Wall unit needs to be taken with patient to HD also and pt placed on wall unit during HD until controller changed out can be done . RN will continue to monitor.

## 2020-04-22 NOTE — Progress Notes (Signed)
Patient ID: [name] [name], male   DOB: 1951-08-19, 69 y.o.   MRN: [id number]    S: HD got bumped yesterday-  Seen on HD this AM, not very responsive- pre HD BUN 141- hgb 7.6   O:BP 112/82   Pulse 93   Temp 97.6 F (36.4 C) (Oral)   Resp 11   Ht 6' (1.829 m)   Wt 102.3 kg   SpO2 90%   BMI 30.59 kg/m   Intake/Output Summary (Last 24 hours) at 04/22/2020 1006 Last data filed at 04/22/2020 0400 Gross per 24 hour  Intake 679.17 ml  Output --  Net 679.17 ml   Intake/Output: I/O last 3 completed shifts: In: 1629.2 [P.O.:160; NG/GT:1469.2] Out: -   Intake/Output this shift:  No intake/output data recorded. Weight change: -0.4 kg Gen: resting in bed in NAD CVS: mechanical hum Resp: cta Abd: +BS< soft, nt/nd Ext: trace edema  Recent Labs  Lab 04/16/20 0450 04/17/20 0043 04/18/20 0259 04/19/20 0239 04/20/20 0253 04/21/20 0237 04/22/20 0254  NA 134* 133* 135 135 135 134* 132*  K 5.0 4.0 4.1 3.9 3.8 4.3 4.9  CL 96* 95* 95* 94* 94* 94* 92*  CO2 25 24 25 26 26 25 25   GLUCOSE 76 191* 144* 75 187* 149* 143*  BUN 97* 57* 89* 53* 82* 112* 141*  CREATININE 6.32* 4.34* 6.40* 4.37* 5.80* 7.14* 8.32*  ALBUMIN 2.0* 2.4* 2.3* 2.3* 2.3* 2.3* 2.3*  CALCIUM 8.2* 8.0* 8.2* 8.2* 8.5* 8.6* 8.6*  PHOS 6.9* 5.9* 5.8* 5.1* 5.2* 4.8* 6.3*   Liver Function Tests: Recent Labs  Lab 04/20/20 0253 04/21/20 0237 04/22/20 0254  ALBUMIN 2.3* 2.3* 2.3*   No results for input(s): LIPASE, AMYLASE in the last 168 hours. No results for input(s): AMMONIA in the last 168 hours. CBC: Recent Labs  Lab 04/18/20 0259 04/18/20 0259 04/19/20 0239 04/19/20 0239 04/20/20 0253 04/21/20 0237 04/22/20 0254  WBC 7.8   < > 9.3   < > 9.1 9.3 8.1  HGB 7.2*   < > 8.6*   < > 8.3* 8.1* 7.6*  HCT 23.0*   < > 26.8*   < > 26.7* 25.6* 24.0*  MCV 88.8  --  88.4  --  88.1 86.8 86.6  PLT 261   < > 281   < > 288 300 292   < > = values in this interval not displayed.   Cardiac Enzymes: No results for  input(s): CKTOTAL, CKMB, CKMBINDEX, TROPONINI in the last 168 hours. CBG: Recent Labs  Lab 04/21/20 1326 04/21/20 1721 04/21/20 1916 04/21/20 2349 04/22/20 0352  GLUCAP 179* 181* 193* 156* 118*    Iron Studies: No results for input(s): IRON, TIBC, TRANSFERRIN, FERRITIN in the last 72 hours. Studies/Results: No results found. . sodium chloride   Intravenous Once  . aspirin  81 mg Per Tube Daily  . atorvastatin  80 mg Per Tube q1800  . B-complex with vitamin C  1 tablet Per Tube Daily  . bisacodyl  10 mg Oral Daily   Or  . bisacodyl  10 mg Rectal Daily  . Chlorhexidine Gluconate Cloth  6 each Topical Daily  . citalopram  10 mg Per Tube Daily  . darbepoetin (ARANESP) injection - NON-DIALYSIS  100 mcg Subcutaneous Q Tue-1800  . feeding supplement (NEPRO CARB STEADY)  237 mL Oral BID BM  . feeding supplement (PRO-STAT SUGAR FREE 64)  30 mL Per Tube BID  . influenza vaccine adjuvanted  0.5 mL Intramuscular Tomorrow-1000  .  insulin aspart  0-24 Units Subcutaneous Q4H  . insulin aspart  6 Units Subcutaneous Q4H  . insulin glargine  24 Units Subcutaneous BID  . pantoprazole (PROTONIX) IV  40 mg Intravenous Daily  . sodium chloride flush  10-40 mL Intracatheter Q12H  . Warfarin - Pharmacist Dosing Inpatient   Does not apply q1600    BMET    Component Value Date/Time   NA 132 (L) 04/22/2020 0254   NA 132 (L) 03/07/2020 1501   K 4.9 04/22/2020 0254   CL 92 (L) 04/22/2020 0254   CO2 25 04/22/2020 0254   GLUCOSE 143 (H) 04/22/2020 0254   BUN 141 (H) 04/22/2020 0254   BUN 16 03/07/2020 1501   CREATININE 8.32 (H) 04/22/2020 0254   CREATININE 1.29 (H) 12/10/2016 1011   CALCIUM 8.6 (L) 04/22/2020 0254   GFRNONAA 6 (L) 04/22/2020 0254   GFRNONAA 57 (L) 01/28/2015 1558   GFRAA 7 (L) 04/22/2020 0254   GFRAA 66 01/28/2015 1558   CBC    Component Value Date/Time   WBC 8.1 04/22/2020 0254   RBC 2.77 (L) 04/22/2020 0254   HGB 7.6 (L) 04/22/2020 0254   HGB 15.8 04/24/2018 1148    HCT 24.0 (L) 04/22/2020 0254   HCT 45.9 04/24/2018 1148   PLT 292 04/22/2020 0254   PLT 281 04/24/2018 1148   MCV 86.6 04/22/2020 0254   MCV 89 04/24/2018 1148   MCH 27.4 04/22/2020 0254   MCHC 31.7 04/22/2020 0254   RDW 15.6 (H) 04/22/2020 0254   RDW 14.2 04/24/2018 1148   LYMPHSABS 0.4 (L) 04/02/2020 0429   LYMPHSABS 1.1 04/24/2018 1148   MONOABS 2.2 (H) 04/02/2020 0429   EOSABS 0.0 04/02/2020 0429   EOSABS 0.2 04/24/2018 1148   BASOSABS 0.1 04/02/2020 0429   BASOSABS 0.1 04/24/2018 1148   Assessment/Plan:  1. AKI, in setting of cardiogenic shock, pressors, s/p LVAD 03/23/2020. CRRT initiated on 03/25/20 and stopped 04/12/20.Tolerated hisfirst intermittent HD 04/14/20 1. Tolerated HD for the last 3 sessions  2. Plan for HD today off schedule and 4 days after last - I am afraid needs to be done tomorrow as well 3. No heparin with HD on systemic anticoagulation 2. Hyperkalemia-improved with HD and lokelma.  stopped lokelma. 3. Vascular access-temp cath removed and RIJ TDC placed 04/16/20 by IR. 1. He will need an AVF/AVG when he is more stable- possibly this week although looks pretty rough today  4. sCHF s/p LVAD 03/23/2020 5. A fib s/p MAZE and LAA clipping 4/8 6. R MCA ischemic stroke- large, seen on CT on 03/27/20. 7. Left hemothorax s/p VATS 03/25/2020. 8. Anemia-s/p transfusion 04/14/20 and 5/7.Continue to follow, transfuse PRN.  on ESA. 9. Bones-  Check pth - first day phos was high-  Had been controlled with HD    [name] [name]

## 2020-04-22 NOTE — Progress Notes (Signed)
RN attempted to reconnect tube feed. Patient refused.

## 2020-04-22 NOTE — Progress Notes (Signed)
Patient currently on RA. No BIPAP at this time.

## 2020-04-22 NOTE — Progress Notes (Signed)
CSW met with patient at bedside. Patient was sleeping but easily aroused. Patient stated he was feeling nauseous the other day but states he is feeling better today. CSW and patient discussed his caregivers and patient firmly stated that [name] [name] was his primary caregiver and also mentioned that [name] [name] is "fine too". Patient did not want to discuss family concerns but was clear with his wishes. Patient stated interest in completing an Advanced Directive but stated "not today because I am tired". CSW will continue to follow for supportive intervention throughout implant hospitalization. [name], [place], [place]

## 2020-04-22 NOTE — Progress Notes (Signed)
RN attempted to connect patients tube feed but patient is refusing. Will continue to monitor.

## 2020-04-22 NOTE — Progress Notes (Signed)
Nutrition Follow-up  DOCUMENTATION CODES:   Not applicable  INTERVENTION:   Calorie Count ordered to better assess po intake, especially given pt is refusing TF at this time; plan to begin tomorrow 5/12 in AM. Discussed with RN  Recommend continuing TF via Cortrak until pt demonstrating consistent and adequate po intake. Pt refusing TF at this time  Tube Feeding Recommendations: Nepro at 50 ml/hr Pro-Stat 30 mL BID Provides 2360 kcals, 127 g of protein and 876 mL of free water Meets 100% estimated calorie and protein needs  Continue Nepro Shake po BID, each supplement provides 425 kcal and 19 grams protein  Magic cup TID with meals, each supplement provides 290 kcal and 9 grams of protein  Change B-complex with C to Rena-Vit daily  NUTRITION DIAGNOSIS:   Increased nutrient needs related to post-op healing, chronic illness as evidenced by estimated needs.  Being addressed via tube feeding, supplements  GOAL:   Patient will meet greater than or equal to 90% of their needs  Progressing  MONITOR:   PO intake, Supplement acceptance, Labs, Weight trends  REASON FOR ASSESSMENT:   Consult LVAD Eval  ASSESSMENT:   69 yo male admitted with acute on chronic CHF, AKI on CKD 3. Initial plan for CABG but with re-do sternotomy, poor targets and longstanding low EF plan for LVAD instead. PMH includes CAD, CKD, DM  3/31 Admit, Cath with severe 3v CAD 4/03 Swan placed  4/06 Teeth extraction 4/08 HeartMate 3 LVAD placed, re-do sternotomy, Intubated 4/09 Extubated 4/13 CRRT initiated 4/14 Cortrak placed, TF started 4/15 Head CT: right [facility] CVA 4/19 CRRT stopped, respiratory arrest requiring intubation 4/20 Hemothorax requiring emergent chest tube placement, VATS, CRRT re-initiated 4/21 Cortrak advanced to proximal duodenum; Concern for posturing, EEG 4/25 Extubated 4/26 Cortrak repositioned 5/01 CRRT discontinued 5/03 1st iHD 5/05 RIJ TDC placed 5/06 Diet advanced to  Dsyphagia 1, Thins 5/08 Abd xray with Cortrak tip in 3rd portion of duodenum, no obstruction/ileus  Pt very weak. Noted CIR recommended  iHD today, bumped from scheduled HD yesterday; per MD note, pt not very responsive pre-HD. No recorded po intake since diet advancement on 5/6. Pt did not eat any breakfast yesterday prior to transfer to [facility] unit. Pt does not have appeared to receive by 1 Nepro shake since ordered.  Pt has only had 2 bites of applesauce today per RN (of note, pt did miss breakfast due to HD)  Previously Tolerating Nepro TF at rate of 50 ml/hr with Pro-Stat 30 mL BID via Cortrak tube. Noted overnight, pt indicating to RN that he doesn't want "anything running down that tube in my nose." RN reinforced importance of TF via tube at present time but pt stating he did not want. TF has been off since then per RN today.   Abdominal xray on 5/08 with Cortrak tip in 3rd portion of duodenum. No obstruction/ileus  Labs: phosphorus 6.3 (H), sodium 132 (L), CBGs 118-193, corrected calcium 9.96 (albumin 2.3) Meds: ss novolog, lantus, novolog q 4 hours, B complex with C  Diet Order:   Diet Order            DIET DYS 3 Room service appropriate? Yes; Fluid consistency: Thin  Diet effective now              EDUCATION NEEDS:   Education needs have been addressed  Skin:  Skin Assessment: Skin Integrity Issues: Skin Integrity Issues:: Incisions Incisions: sternum-healing  Last BM:  5/10 (rectal tube removed)  Height:  Ht Readings from Last 1 Encounters:  03/28/2020 6' (1.829 m)    Weight:   Wt Readings from Last 1 Encounters:  04/22/20 99.2 kg    BMI:  Body mass index is 29.66 kg/m.  Estimated Nutritional Needs:   Kcal:  2300-2500 kcals  Protein:  120-140g  Fluid:  1000 mL plus UOP    Kerman Passey MS, RDN, LDN, CNSC RD Pager Number and RD On-Call Pager Number Located in [place]

## 2020-04-22 NOTE — Procedures (Signed)
Patient was seen on dialysis and the procedure was supervised.  BFR 400  Via TDC BP is  112/82.   Patient appears to be tolerating treatment well-  Is somnolent- BUN was 140's   Louis Meckel 04/22/2020

## 2020-04-22 NOTE — Progress Notes (Addendum)
Patient ID: [name] [name], male   DOB: [birth year], 69 y.o.   MRN: [id number]    Advanced Heart Failure Rounding Note   Subjective:    - S/p HM3 VAD 4/8 w MAZE procedure + LAA clipping. - Extubated 4/9 - 4/12 LVAD speed increased to 5400 --> echo moderate-severe RV dysfunction, septum mildly left shifted, trivial pericardial effusion.  LVAD speed cut back to 5300.  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  - 4/20 developed hemothorax requiring emergent CT placement and eventual VATS - 4/21 EEG done given concerns for posturing and c/w moderate to severe diffuse encephalopathy. No seizures or epileptiform discharges were seen  - 04/03/20 Pump Stop --> controller change out. CT of head unchanged R MCA CVA - 04/06/20 Extubated - 5/1 CRRT stopped. Had first iHD 5/3.  - 5/4 1uRBC - 5/6 1u RBC   Feeling a little better today. Denies pain. Says he is thankful for eveyone taking care of him.   VAD Interrogation  Flow 3.8 Speed 5300, Power 4  Pulse Index 6.5  VAD interrogated personally. Parameters stable. Had issue with VAD controlled.    Objective:   Weight Range:  Vital Signs:   Temp:  [97.4 F (36.3 C)-98.5 F (36.9 C)] 97.6 F (36.4 C) (05/11 0700) Pulse Rate:  [76-86] 86 (05/11 0830) Resp:  [11-27] 11 (05/11 0333) BP: (93-129)/(63-103) 93/82 (05/11 0830) SpO2:  [90 %-97 %] 90 % (05/11 0333) Weight:  [101.6 kg-102.3 kg] 102.3 kg (05/11 0700) Last BM Date: 04/21/20  Weight change: Filed Weights   04/21/20 0500 04/22/20 0333 04/22/20 0700  Weight: 102 kg 101.6 kg 102.3 kg    Intake/Output:   Intake/Output Summary (Last 24 hours) at 04/22/2020 0902 Last data filed at 04/22/2020 0400 Gross per 24 hour  Intake 829.17 ml  Output --  Net 829.17 ml       Physical Exam: GENERAL: No acute distress. In bed  HEENT: normal  NECK: Supple, JVP 5-6.  Carotid  2+ bilaterally, no bruits.  No lymphadenopathy or thyromegaly appreciated.  CARDIAC:  Mechanical heart  sounds with LVAD hum present. L upper chest tunneled HD catheter LUNGS:  Clear to auscultation bilaterally.  ABDOMEN:  Soft, round, nontender, positive bowel sounds x4.     LVAD exit site:  Dressing dry and intact.  No drainage.  Stabilization device present and accurately applied.  Driveline dressing is being changed daily per sterile technique. EXTREMITIES:  Warm and dry, no cyanosis, clubbing, rash or edema  NEUROLOGIC:  Alert and oriented x 3.  No aphasia.  No dysarthria.  Affect pleasant.       Telemetry: A fib 80s   Labs: Basic Metabolic Panel: Recent Labs  Lab 04/18/20 0259 04/18/20 0259 04/19/20 0239 04/19/20 0239 04/20/20 0253 04/21/20 0237 04/22/20 0254  NA 135  --  135  --  135 134* 132*  K 4.1  --  3.9  --  3.8 4.3 4.9  CL 95*  --  94*  --  94* 94* 92*  CO2 25  --  26  --  26 25 25   GLUCOSE 144*  --  75  --  187* 149* 143*  BUN 89*  --  53*  --  82* 112* 141*  CREATININE 6.40*  --  4.37*  --  5.80* 7.14* 8.32*  CALCIUM 8.2*   < > 8.2*   < > 8.5* 8.6* 8.6*  MG 2.5*  --  2.3  --  2.7* 2.9* 3.1*  PHOS 5.8*  --  5.1*  --  5.2* 4.8* 6.3*   < > = values in this interval not displayed.    Liver Function Tests: Recent Labs  Lab 04/18/20 0259 04/19/20 0239 04/20/20 0253 04/21/20 0237 04/22/20 0254  ALBUMIN 2.3* 2.3* 2.3* 2.3* 2.3*   No results for input(s): LIPASE, AMYLASE in the last 168 hours. No results for input(s): AMMONIA in the last 168 hours.  CBC: Recent Labs  Lab 04/18/20 0259 04/19/20 0239 04/20/20 0253 04/21/20 0237 04/22/20 0254  WBC 7.8 9.3 9.1 9.3 8.1  HGB 7.2* 8.6* 8.3* 8.1* 7.6*  HCT 23.0* 26.8* 26.7* 25.6* 24.0*  MCV 88.8 88.4 88.1 86.8 86.6  PLT 261 281 288 300 292    Cardiac Enzymes: No results for input(s): CKTOTAL, CKMB, CKMBINDEX, TROPONINI in the last 168 hours.  BNP: BNP (last 3 results) Recent Labs    04/02/20 2342 04/10/20 0132 04/17/20 0043  BNP 332.7* 267.2* 582.1*    ProBNP (last 3 results) No results for  input(s): PROBNP in the last 8760 hours.    Other results:  Imaging: No results found.   Medications:     Scheduled Medications: . sodium chloride   Intravenous Once  . aspirin  81 mg Per Tube Daily  . atorvastatin  80 mg Per Tube q1800  . B-complex with vitamin C  1 tablet Per Tube Daily  . bisacodyl  10 mg Oral Daily   Or  . bisacodyl  10 mg Rectal Daily  . Chlorhexidine Gluconate Cloth  6 each Topical Daily  . citalopram  10 mg Per Tube Daily  . darbepoetin (ARANESP) injection - NON-DIALYSIS  100 mcg Subcutaneous Q Tue-1800  . feeding supplement (NEPRO CARB STEADY)  237 mL Oral BID BM  . feeding supplement (PRO-STAT SUGAR FREE 64)  30 mL Per Tube BID  . influenza vaccine adjuvanted  0.5 mL Intramuscular Tomorrow-1000  . insulin aspart  0-24 Units Subcutaneous Q4H  . insulin aspart  6 Units Subcutaneous Q4H  . insulin glargine  24 Units Subcutaneous BID  . pantoprazole (PROTONIX) IV  40 mg Intravenous Daily  . sodium chloride flush  10-40 mL Intracatheter Q12H  . Warfarin - Pharmacist Dosing Inpatient   Does not apply q1600    Infusions: . sodium chloride Stopped (04/15/20 1731)  . feeding supplement (NEPRO CARB STEADY) Stopped (04/21/20 2307)  . norepinephrine (LEVOPHED) Adult infusion Stopped (04/11/20 2215)    PRN Medications: sodium chloride, acetaminophen, dextrose, hydrALAZINE, levalbuterol, ondansetron (ZOFRAN) IV, oxyCODONE, promethazine, sodium chloride flush, sorbitol, traMADol   Assessment/Plan:   1. Acute on chronic systolic HF -> cardiogenic shock-> S/p HM3 LVAD - Echo 2016 EF 30-35% - Echo 1/21 EF 25% - Admitted with NYHA IV symptoms and AKI with attempts at diuresis. Initial co-ox 39% - Echo this admit: EF 10-15% moderate RV dysfunction - R/LHC cath 3/21 with severe 3v CAD and low output with CI 1.7.  - Initially planned for CABG but given need for re-do sternotomy, relatively poor targets and longstanding low EF, VAD felt to be better option  -  S/p HM3 VAD 4/8 - Maps 100s. Add carvedilol 3.125 mg twice a day.   - Now off CVVH. Now on iHD - HD today per nephrology.   2. VAD - VAD interrogated personally. Parameters stable. - Had Pump Stop 04/03/20. Controller changed out with old controller sent back to [facility] for evaluation.  - on aspirin 81 mg daily + coumadin.  - LDH  215 - INR 2.4 . We have lowered INR goal to 1.8-2.3 given anemia. Discussed dosing with PharmD personally.  3. CAD with unstable angina - s/p previous PCI. - cath 02/28/2020 with severe 3v CAD - now s/p VAD on 03/31/2020   - No s/s ischemia  4. AKI on CKD 3a -> ESRD - Nephrology consulted. Started CVVHD 4/13.    - Renal US 4/5 unremarkable.  - Hopefully renal function will recover but Renal team not optimistic - He is now off NE w/ stable MAP.  - Off CVVH.  1st iHD 5/3.  -  Tunnel HD cath placed 5/5.  - Tolerating iHD   5. Permanent AF - now s/p MAZE + LAA Clipping 4/8   - Rate controlled. Add  Carvedilol 3.125 mg twice a day  - On coumadin. INR 2.4  6. MVP s/p MV repair - On admit with recurrent severe MR on echo and with huge v-waves on PCWP tracing - s/p VAD  7. Hemothoax - s/p CT placement and VATs on 4/22 - CTs pulled 4/26 -Resolved   8. ID/leukocytosis - 4/11 procalcitonin 1.96  - Started empiric coverage for PNA with vancomycin/cefepime for 7 days. Stopped 4/17 - Given recent VATs and high-dose pressor requirements he was treated with broad spectrum abx, linezolid and cefepime (completed course 4/28)  -WBC 8.1  9. Acute Hypoxic Respiratory Failure - Re-intubated 4/20 with hemothorax- - Extubated 4/25 - On room air.   10. Malnutrition  - Albumin 2.3 - Remains on tube feeds.  - Speech following--> placed on thin liquid dysphagia 1  11. CVA/anoxic brain injury - 03/27/20 CT large posterior MCA ischemic infarct, possibly from atrial fibrillation.  INR was supratherapeutic when CVA found.  Stable LDH, do not think partial pump  thrombosis is the culprit. - Still with some L weakness and inattention but improving  - PT/OT/speech following.  - CIR consulted.    12. Anemia, post-op  - Received 2u RBC 04/05/20 + 1 Unit on 5/3 + 1 unit 5/4 + 1u 5/7 - hgb  Slowly trending down 8.6>8.3>8.1>7.6  - lower INR goal to 1.8-2.3  13. Hyperkalemia - resolved  - Continue iHD   14. DM2, poorly controlled - hgbA1c 11.1% - continue insulin   15. Agitation/ Depression  - Celexa 10 mg daily added.   16. Deconditioning - Continue PT/OT . Discussed the importance of working with therapy.   -CIR recommended.   17. Lower ab pain - kub ok. Bladder scan ok  - Resolved.   Length of Stay: [place], NP  9:02 AM  04/22/2020   Patient seen and examined with the above-signed Advanced Practice Provider and/or Housestaff. I personally reviewed laboratory data, imaging studies and relevant notes. I independently examined the patient and formulated the important aspects of the plan. I have edited the note to reflect any of my changes or salient points. I have personally discussed the plan with the patient and/or family.  Got bumped from HD yesterday and seemed more uremic this am. Now improved and somewhat more interactive. BP tolerating well.  INR  2.4. No evidence of bleeding   General:  Sitting up in bed NAD.  HEENT: normal  Neck: supple. JVP mildly elevated.  Carotids 2+ bilat; no bruits. No lymphadenopathy or thryomegaly appreciated. Cor: LVAD hum.  + tunneled cath Lungs: Clear. Abdomen: osoft, nontender, non-distended. No hepatosplenomegaly. No bruits or masses. Good bowel sounds. Driveline site clean. Anchor in place.  Extremities: no cyanosis, clubbing,  rash. Warm no edema  Neuro: communicative No focal deficits. Moves all 4 without problem   Likely has some uremia today but responding to HD. Volume status improving as well. INR 2.4. Discussed dosing with PharmD personally. VAD interrogated personally. Parameters  stable. Can decrease midodrine. Will need aggressive PT/OT with hopes of getting to CIR.   Glori Bickers, MD  12:43 PM

## 2020-04-22 NOTE — Progress Notes (Signed)
LVAD Coordinator Rounding Note:  HM III LVAD implanted on 03/21/2020 with MAZE procedure + LAA clipping  by Dr Orvan Seen under destination therapy criteria due to advanced age. Primary Heart Failure Cardiologist is Dr. Haroldine Laws.   Pt currently in HD receiving treatment. Will not verbally respond to questions I asked. Will only nod head yes or no, or rolls his eyes in response to questions.   Per HD RN, patient's controller has "beeped" a few times during his treatment, but no alarm message on controller with alarm. I attempted to look at last 6 alarms on controller (cart not present in HD) and controller screen freezes on first alarm condition on the screen and will not scroll to subsequent alarms. Brought cart up from [facility] and looked at history. 2 "power cable disconnect" alarms noted on interrogation at 0952 and 1032 x 2. At time of alarms pt was lying in bed on batteries. Controller, battery, and clips have all been exchanged once for alarms since implant. Will continue to monitor for alarms at this time.     Vital signs: Temp: 97.7 HR: 90 - afib Cuff: 115/93 (102) Doppler: 100 O2 Sat: 98% on RA Wt: 253.3>262.3>260.8>259.9>237.8.Marland Kitchen..229>210>225>233.6>217.5>224.8>218.7 lbs   LVAD interrogation reveals:  Speed: 5300 Flow: 4.1 Power: 4.0w  PI: 3.9  Alarms: see above Events: none Hematocrit: 26  Fixed speed: 5300 Low speed limit: 5000   Drive Line:CDI. Anchor secure. Dressing changes twice weekly (Monday / Thursday) dressing changes per nurse champion or VAD coordinator.  Next dressing change due 04/21/20.   Labs:  LDH trend: 360>513>482>...283>273>259>243>262>271>241>253>224>225>215  INR trend: 1.3>6.2>9>4.4>6.2>2.2....2.8>3.1>2.4>2.3>2.5>2.1>2.1>2.4>2.1>2.2>2.4  CR: 3.57>3.98>3.18>2.44...>4.3>3.6>2.53>2.25>2.16>1.88>1.83>1.88>2.3>5.52>4.54>6.3>4.34>6.4>7.14>8.32  BUN: 141   Anticoagulation Plan: -INR Goal: 1.8 - 2.3  -ASA Dose: 81 mg - will continue due to hx of CVA per Dr.  Haroldine Laws  Respiratory: - extubated 4/9/2 - re-intubated 04/07/2020 s/p left hemothorax with cardiac arrest - extubated 04/06/20  Renal:  - CVVH started 03/25/20 - Intermittent HD dialysis started 04/14/20 - 04/16/20 Tunneled HD cath placed  Neuro: - Right MCA CVA on 03/27/20  Nitric Oxide: off 03/23/20  Blood Products:  Intraop: 6 FFP  DDAVP  420 cell saver  03/21/2020>>4 FFP 03/21/20>> 1 RBC 03/22/20>>1 RBC  Intra-op: 04/10/2020>>2 PCs      2 FFP Post-op: 03/21/2020>>3 PC's, 2 FFP 04/14/20>>1 PC's 04/15/20>> 1 PC's 04/18/20>>1 PC's  Cultures:  -03/25/20- blood cultures>>negative - 03/24/2020-blood cultures>>negative - 04/03/20 - respiratory culture>>neg   Device: -St [name] -Therapies: VF 194 - therapy on  Drips: - Milrinone 0.125 mcg/kg/min >> off 04/09/20 - Levo 1 mcg/min>> off 04/10/20 - Amiodarone 30 mg/hr-off - Tube feed: 50 mL/hr   Adverse Events on VAD: >>03/25/20 CVVH Started >>03/22/2020 left hemothorax; cardiac arrest >>04/14/20 HD  Pt Education:  1. Pt not agreeable to VAD teaching this am. Pt will not speak with VAD coordinator; will only nod yes or no, or roll his eyes in response to questions.  2. No family at bedside.  Plan/Recommendations:  1. Call VAD coordinator for any equipment or drive line issues.  2. Dressing changes Monday / Thursday per nurse champion or VAD coordinator.   Emerson Monte RN [place] Coordinator  Office: [phone]  24/7 Pager: [phone]

## 2020-04-22 NOTE — Progress Notes (Signed)
OT Cancellation Note  Patient Details Name: [name] [name] MRN: [id number] DOB: January 30, 1951   Cancelled Treatment:    Reason Eval/Treat Not Completed: Patient at procedure or test/ unavailable (HD), will follow up for OT treatment as able.  Lou Cal, OT Acute Rehabilitation Services Pager [phone] Office [phone]   [name] 04/22/2020, 9:14 AM

## 2020-04-22 NOTE — Progress Notes (Signed)
ANTICOAGULATION CONSULT NOTE  Pharmacy Consult for warfarin Indication: post op LVAD  / Aflutter  Allergies  Allergen Reactions  . Liraglutide Nausea And Vomiting  . Lisinopril Cough    Patient Measurements: Height: 6' (182.9 cm) Weight: 99.2 kg (218 lb 11.1 oz) IBW/kg (Calculated) : 77.6 Heparin Dosing Weight: 101.2 kg  Vital Signs: Temp: 97.7 F (36.5 C) (05/11 1034) Temp Source: Oral (05/11 1034) BP: 115/93 (05/11 1034) Pulse Rate: 89 (05/11 1034)  Labs: Recent Labs    04/20/20 0253 04/20/20 0253 04/21/20 0237 04/22/20 0254  HGB 8.3*   < > 8.1* 7.6*  HCT 26.7*  --  25.6* 24.0*  PLT 288  --  300 292  LABPROT 23.1*  --  23.4* 25.3*  INR 2.1*  --  2.2* 2.4*  CREATININE 5.80*  --  7.14* 8.32*   < > = values in this interval not displayed.    Estimated Creatinine Clearance: 10.1 mL/min (A) (by C-G formula based on SCr of 8.32 mg/dL (H)).   Assessment: 43 YOM with atrial fibrillation on Eliquis PTA s/p R & L heart cath found to have 3v disease s/p  LVAD  Implant 4/8. Warfarin initiated on 4/9 after discussing with MD.  CT on 4/15 - found to have large right MCA stroke. INR goal reduced 5/4.  INR 2.4 slightly above goal, LDH and CBC stable.   Goal of Therapy:  INR 1.8-2.3 Monitor platelets by anticoagulation protocol: Yes   Plan:  -Warfarin 0.5 mg x1 again tonight -Daily PT/INR   Arrie Senate, PharmD, BCPS Clinical Pharmacist [phone] Please check AMION for all [facility] Pharmacy numbers 04/22/2020

## 2020-04-23 ENCOUNTER — Inpatient Hospital Stay (HOSPITAL_COMMUNITY): Payer: Medicare HMO

## 2020-04-23 LAB — GLUCOSE, CAPILLARY
Glucose-Capillary: 116 mg/dL — ABNORMAL HIGH (ref 70–99)
Glucose-Capillary: 140 mg/dL — ABNORMAL HIGH (ref 70–99)
Glucose-Capillary: 162 mg/dL — ABNORMAL HIGH (ref 70–99)
Glucose-Capillary: 79 mg/dL (ref 70–99)
Glucose-Capillary: 83 mg/dL (ref 70–99)
Glucose-Capillary: 90 mg/dL (ref 70–99)

## 2020-04-23 LAB — RENAL FUNCTION PANEL
Albumin: 2.2 g/dL — ABNORMAL LOW (ref 3.5–5.0)
Anion gap: 14 (ref 5–15)
BUN: 73 mg/dL — ABNORMAL HIGH (ref 8–23)
CO2: 26 mmol/L (ref 22–32)
Calcium: 8.7 mg/dL — ABNORMAL LOW (ref 8.9–10.3)
Chloride: 94 mmol/L — ABNORMAL LOW (ref 98–111)
Creatinine, Ser: 5.59 mg/dL — ABNORMAL HIGH (ref 0.61–1.24)
GFR calc Af Amer: 11 mL/min — ABNORMAL LOW (ref >60)
GFR calc non Af Amer: 9 mL/min — ABNORMAL LOW (ref >60)
Glucose, Bld: 109 mg/dL — ABNORMAL HIGH (ref 70–99)
Phosphorus: 5.4 mg/dL — ABNORMAL HIGH (ref 2.5–4.6)
Potassium: 4.5 mmol/L (ref 3.5–5.1)
Sodium: 134 mmol/L — ABNORMAL LOW (ref 135–145)

## 2020-04-23 LAB — CBC
HCT: 25.9 % — ABNORMAL LOW (ref 39.0–52.0)
Hemoglobin: 8.2 g/dL — ABNORMAL LOW (ref 13.0–17.0)
MCH: 27.7 pg (ref 26.0–34.0)
MCHC: 31.7 g/dL (ref 30.0–36.0)
MCV: 87.5 fL (ref 80.0–100.0)
Platelets: 315 10*3/uL (ref 150–400)
RBC: 2.96 MIL/uL — ABNORMAL LOW (ref 4.22–5.81)
RDW: 15.6 % — ABNORMAL HIGH (ref 11.5–15.5)
WBC: 7.9 10*3/uL (ref 4.0–10.5)
nRBC: 0 % (ref 0.0–0.2)

## 2020-04-23 LAB — PROTIME-INR
INR: 2.6 — ABNORMAL HIGH (ref 0.8–1.2)
Prothrombin Time: 26.8 seconds — ABNORMAL HIGH (ref 11.4–15.2)

## 2020-04-23 LAB — MAGNESIUM: Magnesium: 2.6 mg/dL — ABNORMAL HIGH (ref 1.7–2.4)

## 2020-04-23 LAB — LACTATE DEHYDROGENASE: LDH: 222 U/L — ABNORMAL HIGH (ref 98–192)

## 2020-04-23 MED ORDER — HEPARIN SODIUM (PORCINE) 1000 UNIT/ML IJ SOLN
INTRAMUSCULAR | Status: AC
  Administered 2020-04-23: 3800 [IU] via INTRAVENOUS
  Filled 2020-04-23: qty 4

## 2020-04-23 NOTE — Progress Notes (Signed)
Patient ID: [name] [name], male   DOB: 04-15-1951, 68 y.o.   MRN: [id number]    S: HD completed yesterday -  Removed 1900- tolerated well.  The plan is for HD again today -  Still pretty somnolent-  Have not had a meaningful conversation with him this week    O:BP 101/87   Pulse 61   Temp (!) 97.2 F (36.2 C) (Oral)   Resp 18   Ht 6' (1.829 m)   Wt 100.6 kg   SpO2 94%   BMI 30.08 kg/m   Intake/Output Summary (Last 24 hours) at 04/23/2020 0743 Last data filed at 04/22/2020 1718 Gross per 24 hour  Intake 0 ml  Output 1937 ml  Net -1937 ml   Intake/Output: I/O last 3 completed shifts: In: 229.2 [P.O.:120; NG/GT:109.2] Out: 1937 [IDPOE:4235]  Intake/Output this shift:  No intake/output data recorded. Weight change: -2.4 kg Gen: resting in bed in NAD CVS: mechanical hum Resp: cta Abd: +BS< soft, nt/nd Ext: trace edema  Recent Labs  Lab 04/17/20 0043 04/18/20 0259 04/19/20 0239 04/20/20 0253 04/21/20 0237 04/22/20 0254 04/23/20 0326  NA 133* 135 135 135 134* 132* 134*  K 4.0 4.1 3.9 3.8 4.3 4.9 4.5  CL 95* 95* 94* 94* 94* 92* 94*  CO2 24 25 26 26 25 25 26   GLUCOSE 191* 144* 75 187* 149* 143* 109*  BUN 57* 89* 53* 82* 112* 141* 73*  CREATININE 4.34* 6.40* 4.37* 5.80* 7.14* 8.32* 5.59*  ALBUMIN 2.4* 2.3* 2.3* 2.3* 2.3* 2.3* 2.2*  CALCIUM 8.0* 8.2* 8.2* 8.5* 8.6* 8.6* 8.7*  PHOS 5.9* 5.8* 5.1* 5.2* 4.8* 6.3* 5.4*   Liver Function Tests: Recent Labs  Lab 04/21/20 0237 04/22/20 0254 04/23/20 0326  ALBUMIN 2.3* 2.3* 2.2*   No results for input(s): LIPASE, AMYLASE in the last 168 hours. No results for input(s): AMMONIA in the last 168 hours. CBC: Recent Labs  Lab 04/18/20 0259 04/18/20 0259 04/19/20 0239 04/19/20 0239 04/20/20 0253 04/21/20 0237 04/22/20 0254  WBC 7.8   < > 9.3   < > 9.1 9.3 8.1  HGB 7.2*   < > 8.6*   < > 8.3* 8.1* 7.6*  HCT 23.0*   < > 26.8*   < > 26.7* 25.6* 24.0*  MCV 88.8  --  88.4  --  88.1 86.8 86.6  PLT 261   < > 281   < >  288 300 292   < > = values in this interval not displayed.   Cardiac Enzymes: No results for input(s): CKTOTAL, CKMB, CKMBINDEX, TROPONINI in the last 168 hours. CBG: Recent Labs  Lab 04/22/20 1203 04/22/20 1633 04/22/20 1914 04/22/20 2347 04/23/20 0342  GLUCAP 107* 107* 118* 106* 116*    Iron Studies: No results for input(s): IRON, TIBC, TRANSFERRIN, FERRITIN in the last 72 hours. Studies/Results: No results found. . sodium chloride   Intravenous Once  . aspirin  81 mg Per Tube Daily  . atorvastatin  80 mg Per Tube q1800  . bisacodyl  10 mg Oral Daily   Or  . bisacodyl  10 mg Rectal Daily  . carvedilol  3.125 mg Oral BID WC  . Chlorhexidine Gluconate Cloth  6 each Topical Daily  . Chlorhexidine Gluconate Cloth  6 each Topical Q0600  . citalopram  10 mg Per Tube Daily  . darbepoetin (ARANESP) injection - NON-DIALYSIS  100 mcg Subcutaneous Q Tue-1800  . feeding supplement (NEPRO CARB STEADY)  237 mL Oral BID BM  .  feeding supplement (PRO-STAT SUGAR FREE 64)  30 mL Per Tube BID  . influenza vaccine adjuvanted  0.5 mL Intramuscular Tomorrow-1000  . insulin aspart  0-24 Units Subcutaneous Q4H  . insulin aspart  6 Units Subcutaneous Q4H  . insulin glargine  24 Units Subcutaneous BID  . multivitamin  1 tablet Oral QHS  . pantoprazole (PROTONIX) IV  40 mg Intravenous Daily  . sodium chloride flush  10-40 mL Intracatheter Q12H  . Warfarin - Pharmacist Dosing Inpatient   Does not apply q1600    BMET    Component Value Date/Time   NA 134 (L) 04/23/2020 0326   NA 132 (L) 03/07/2020 1501   K 4.5 04/23/2020 0326   CL 94 (L) 04/23/2020 0326   CO2 26 04/23/2020 0326   GLUCOSE 109 (H) 04/23/2020 0326   BUN 73 (H) 04/23/2020 0326   BUN 16 03/07/2020 1501   CREATININE 5.59 (H) 04/23/2020 0326   CREATININE 1.29 (H) 12/10/2016 1011   CALCIUM 8.7 (L) 04/23/2020 0326   GFRNONAA 9 (L) 04/23/2020 0326   GFRNONAA 57 (L) 01/28/2015 1558   GFRAA 11 (L) 04/23/2020 0326   GFRAA 66  01/28/2015 1558   CBC    Component Value Date/Time   WBC 8.1 04/22/2020 0254   RBC 2.77 (L) 04/22/2020 0254   HGB 7.6 (L) 04/22/2020 0254   HGB 15.8 04/24/2018 1148   HCT 24.0 (L) 04/22/2020 0254   HCT 45.9 04/24/2018 1148   PLT 292 04/22/2020 0254   PLT 281 04/24/2018 1148   MCV 86.6 04/22/2020 0254   MCV 89 04/24/2018 1148   MCH 27.4 04/22/2020 0254   MCHC 31.7 04/22/2020 0254   RDW 15.6 (H) 04/22/2020 0254   RDW 14.2 04/24/2018 1148   LYMPHSABS 0.4 (L) 04/02/2020 0429   LYMPHSABS 1.1 04/24/2018 1148   MONOABS 2.2 (H) 04/02/2020 0429   EOSABS 0.0 04/02/2020 0429   EOSABS 0.2 04/24/2018 1148   BASOSABS 0.1 04/02/2020 0429   BASOSABS 0.1 04/24/2018 1148   Assessment/Plan:  1. A on CKD (crt 1.5 on 03/07/20) in setting of cardiogenic shock, pressors, s/p LVAD 03/23/2020. CRRT initiated on 03/25/20 and stopped 04/12/20.Tolerated hisfirst intermittent HD 04/14/20.  Has been RRT dep now for a month, do not suspect he will recover renal function 1. Tolerated HD for the last 3 sessions  2. Plan for HD today- then to continue MWF-  Not exactly a candidate for OP right now - too somnolent, not sure he will be able to sit up for HD  3. No heparin with HD on systemic anticoagulation 2. Hyperkalemia-resolved 3. Vascular access-temp cath removed and RIJ TDC placed 04/16/20 by IR. 1. He will need an AVF/AVG when he is more stable- possibly this week although still looks pretty ill- may not be a candidate for it just yet   4. sCHF s/p LVAD 03/22/2020 5. A fib s/p MAZE and LAA clipping 4/8 6. R MCA ischemic stroke- large, seen on CT on 03/27/20. 7. Left hemothorax s/p VATS 03/16/2020. 8. Anemia-s/p transfusion 04/14/20 and 5/7.Continue to follow, transfuse PRN.  on ESA. 9. Bones-  Check pth - first day phos was high-  Had been controlled with HD    [place] [phone]

## 2020-04-23 NOTE — Progress Notes (Signed)
Physical Therapy Treatment Patient Details Name: [name] [name] MRN: [id number] DOB: 1951/10/15 Today's Date: 04/23/2020    History of Present Illness 69 yo male with history of CAD with prior stenting of the LAD, Diagonal and OM in an outside hospital, ischemic cardiomyopathy with ICD in place, chronic systolic CHF, MVP s/p mitral valve repair, permanent atrial fib on chronic anticoagulation, HTN, HLD, DM, depression [facility] with progressive weakness, fatigue, dyspnea and chest pressure. Troponin elevated at 0.06. He was transferred to [facility] for cardiac cath, showing severe 3 vessel disease. Plan for CABG but pt with AKI. Pt underwent multiple tooth extraction on 4/6 and LVAD placement on 4/8. Postop day 7 patient was noted to be agitated the CT head was obtained showing a large posterior right MCA infarct. Pt with arrest on 4/20 and reintubated, extubated 4/25. Pt on CRRT until 04/12/20 when transitioned to HD.Tunnelled HD cath 5/5    PT Comments    Pt with in/out of active participation during therapy session. Pt seen in conjunction with OT to optimize patient participation and OOB mobility. Pt remains to have decreased activity tolerance and to require max encouragement to consistently participate. However when he does participate he does well. Pt able to complete 2 stand x 45-60sec. Pt c/o of chest soreness and being cold. Pt given heated blanket at end of session. Pt with noted delayed processing, impaired sequencing, and memory deficits. Continue to recommend CIR upon d/c for maximal functional recovery. Acute PT to cont to follow.    Follow Up Recommendations  CIR;Supervision/Assistance - 24 hour     Equipment Recommendations  Rolling walker with 5" wheels;3in1 (PT);Wheelchair (measurements PT)    Recommendations for Other Services       Precautions / Restrictions Precautions Precautions: Fall;Sternal Precaution Comments: pt with no carry over of precautions,  pt re-educated, also no recall of how to loop lvad controller over neck, pt also with cortrak Restrictions Weight Bearing Restrictions: Yes Other Position/Activity Restrictions: sternal precautions    Mobility  Bed Mobility Overal bed mobility: Needs Assistance Bed Mobility: Supine to Sit     Supine to sit: Mod assist;+2 for physical assistance     General bed mobility comments: max directional verbal and tactile cues at LE to bring off EOB, modAx2 for trunk elevation to adhere to sternal precautions and to initiate transfer due ot pt's poor sequencing ability  Transfers Overall transfer level: Needs assistance Equipment used: Sliding board Transfers: Sit to/from Stand Sit to Stand: Mod assist;+2 physical assistance         General transfer comment: modA to power up from bed, minAx2 from stedy seat  Ambulation/Gait             General Gait Details: unable   Stairs             Wheelchair Mobility    Modified Rankin (Stroke Patients Only) Modified Rankin (Stroke Patients Only) Pre-Morbid Rankin Score: No symptoms Modified Rankin: Severe disability     Balance Overall balance assessment: Needs assistance Sitting-balance support: Feet supported Sitting balance-Leahy Scale: Fair Sitting balance - Comments: pt with increased trunk flexion, pt with mild lean to the L able to hold self up EOB with min guard majority of time but does get fatigued and require minA to maintain upright position   Standing balance support: Bilateral upper extremity supported Standing balance-Leahy Scale: Poor Standing balance comment: stood in stedy with +2 min assist, tolerates only for about 10 seconds  Cognition Arousal/Alertness: Awake/alert Behavior During Therapy: Flat affect(however would get cranky but then also engage) Overall Cognitive Status: Impaired/Different from baseline Area of Impairment:  Orientation;Memory;Safety/judgement;Following commands;Attention                 Orientation Level: Disoriented to;Time Current Attention Level: Focused Memory: Decreased short-term memory;Decreased recall of precautions Following Commands: Follows one step commands inconsistently;Follows one step commands with increased time Safety/Judgement: Decreased awareness of deficits Awareness: Intellectual Problem Solving: Slow processing;Decreased initiation;Difficulty sequencing;Requires verbal cues General Comments: pt in and out of participation, pt one minute saying "leave me alone, I just want to nap" to next minute singing songs and laughing. Pt did say thank you once he was up in the chair      Exercises      General Comments General comments (skin integrity, edema, etc.): VSS      Pertinent Vitals/Pain Pain Assessment: Faces Faces Pain Scale: Hurts little more Pain Location: generalized Pain Descriptors / Indicators: Discomfort;Grimacing;Sore Pain Intervention(s): RN gave pain meds during session    Home Living                      Prior Function            PT Goals (current goals can now be found in the care plan section) Acute Rehab PT Goals Patient Stated Goal: to get warmer Progress towards PT goals: Progressing toward goals    Frequency    Min 3X/week      PT Plan Current plan remains appropriate    Co-evaluation PT/OT/SLP Co-Evaluation/Treatment: Yes Reason for Co-Treatment: Complexity of the patient's impairments (multi-system involvement) PT goals addressed during session: Mobility/safety with mobility OT goals addressed during session: ADL's and self-care      AM-PAC PT "6 Clicks" Mobility   Outcome Measure  Help needed turning from your back to your side while in a flat bed without using bedrails?: A Lot Help needed moving from lying on your back to sitting on the side of a flat bed without using bedrails?: A Lot Help needed  moving to and from a bed to a chair (including a wheelchair)?: A Lot Help needed standing up from a chair using your arms (e.g., wheelchair or bedside chair)?: A Lot Help needed to walk in hospital room?: Total Help needed climbing 3-5 steps with a railing? : Total 6 Click Score: 10    End of Session Equipment Utilized During Treatment: Gait belt Activity Tolerance: Patient limited by fatigue Patient left: in chair;with call bell/phone within reach;with nursing/sitter in room Nurse Communication: Mobility status;Precautions PT Visit Diagnosis: Muscle weakness (generalized) (M62.81);Other abnormalities of gait and mobility (R26.89);Other symptoms and signs involving the nervous system (J82.505)     Time: 3976-7341 PT Time Calculation (min) (ACUTE ONLY): 38 min  Charges:  $Therapeutic Activity: 8-22 mins                     [name], PT, DPT Acute Rehabilitation Services Pager #: [phone] Office #: [phone]    [name] 04/23/2020, 10:04 AM

## 2020-04-23 NOTE — Progress Notes (Signed)
CARDIAC REHAB PHASE I   Attempted to help pt stand with RN, gait belt, and steady. Pt not able to follow instructions due to feeling nauseated. Pt then was an Ax4 to slide from recliner to bed. Pt not appropriate for cardiac rehab at this time. Will continue to follow and will f/u if pt progresses to ambulation.   [id number] Rufina Falco, RN BSN 04/23/2020 11:44 AM

## 2020-04-23 NOTE — Procedures (Signed)
Patient was seen on dialysis and the procedure was supervised.  BFR 400  Via TDC BP is  132/79.   Patient appears to be tolerating treatment well-  Does not respond verbally to me -  This has been what he has been doing -  Appears to roll his eyes at me   Louis Meckel 04/23/2020

## 2020-04-23 NOTE — Progress Notes (Signed)
ANTICOAGULATION CONSULT NOTE  Pharmacy Consult for warfarin Indication: post op LVAD  / Aflutter  Allergies  Allergen Reactions  . Liraglutide Nausea And Vomiting  . Lisinopril Cough    Patient Measurements: Height: 6' (182.9 cm) Weight: 100.6 kg (221 lb 12.5 oz) IBW/kg (Calculated) : 77.6 Heparin Dosing Weight: 101.2 kg  Vital Signs: Temp: 97.2 F (36.2 C) (05/12 0705) Temp Source: Oral (05/12 0705) BP: 101/87 (05/12 0705) Pulse Rate: 61 (05/12 0705)  Labs: Recent Labs    04/21/20 0237 04/22/20 0254 04/23/20 0326  HGB 8.1* 7.6*  --   HCT 25.6* 24.0*  --   PLT 300 292  --   LABPROT 23.4* 25.3* 26.8*  INR 2.2* 2.4* 2.6*  CREATININE 7.14* 8.32* 5.59*    Estimated Creatinine Clearance: 15.1 mL/min (A) (by C-G formula based on SCr of 5.59 mg/dL (H)).   Assessment: 27 YOM with atrial fibrillation on Eliquis PTA s/p R & L heart cath found to have 3v disease s/p  LVAD  Implant 4/8. Warfarin initiated on 4/9 after discussing with MD.  CT on 4/15 - found to have large right MCA stroke. INR goal reduced 5/4.  INR 2.6 and above goal despite 0.5mg  dosing.   Goal of Therapy:  INR 1.8-2.3 Monitor platelets by anticoagulation protocol: Yes   Plan:  -Hold warfarin tonight -Daily PT/INR   Arrie Senate, PharmD, BCPS Clinical Pharmacist [phone] Please check AMION for all [facility] Pharmacy numbers 04/23/2020

## 2020-04-23 NOTE — Progress Notes (Addendum)
Event log from earlier with "power cable disconnect" advisories while patient was on fully charged batteries sent to [employer] for review. See recommendations below:  Below is a summary of the [facility] log file for your review. The log file did capture some intermittent indications of a weak connection between the batteries & clips which caused power cable disconnect events. Since this issue has not been resolved with replacing equipment please work with [place] or [place] & have the controller upgraded to rev 1.7. Rev 1.7 has an alarm delay which should resolve this type of issue.  This issue is not related to the modular cable. There were no other unusual events recorded in the log file event history.   The pump settings & operating ranges are listed below. The pump parameters trending can be viewed in the graph below.   Periodic Log  Patient ID TM  LVAD S/N [id number]  Controller SW 1.6.0  Date range 04/12/2020 04/23/2020  Pump Speed 5300  Low Speed Limit 5000  Avg Pump Speed 5300  Motor power max 3.9  Motor power min 3.5  Motor power avg 3.7  Flow max 4.6  Flow min 3.3  Flow avg 4.0  PI max 8.1  PI min 3.0  PI avg 4.7  HCT% 24   Pt in HD room 8 undergoing dialysis. He is currently on patient cable/power module.  Controller change out performed per [employer]'s recommendation:  Replaced controller [id number]  v 1.6   New controller: [id number] v 1.7 with back up battery [id number].   Pt tolerated controller change out without symptoms. Placed patient back on battery support for rest of dialysis treatment. HD nurse updated.   Drive Line: Existing VAD dressing removed and site care performed using sterile technique. Drive line exit site cleaned with Chlora prep applicators x 2, allowed to dry, and gauze dressing with silver strip re-applied. Exit site healing with partial incorporation, the velour is fully implanted at exit site. Scant amount of serous drainage, small white suture noted  in exit site. No redness, tenderness, foul odor or rash noted. Drive line anchor secure.  Monday/Thursday dressing changes per nurse champion or VAD coordinator.  Next dressing change due 04/28/20.   Zada Girt RN, VAD Coordinator 24/7 VAD Pager: [phone]

## 2020-04-23 NOTE — Progress Notes (Signed)
Brief Nutrition Note  Spoke with RN via phone call. Pt is still refusing tube feeds at this time. RN aware of calorie count initiated this morning at 0700 and is encouraging PO intake and consumption of supplements.  RD will provide results of Day 1 of calorie count tomorrow, 04/24/20.   Gaynell Face, MS, RD, LDN Inpatient Clinical Dietitian Pager: [phone] Weekend/After Hours: [phone]

## 2020-04-23 NOTE — Progress Notes (Signed)
LVAD Coordinator Rounding Note:  HM III LVAD implanted on 03/23/2020 with MAZE procedure + LAA clipping  by Dr Orvan Seen under destination therapy criteria due to advanced age. Primary Heart Failure Cardiologist is Dr. Haroldine Laws.   Pt currently in HD receiving treatment. Will not verbally respond to questions I asked. Will only nod head yes or no, or rolls his eyes in response to questions.   Pt sitting up in chair with breakfast tray in front of him, head down, appears to be sleeping. PT in room, just completed working with patient. Reports he continues to complain about physical/occupational therapy. He is "cold, tired", and wants to be "left alone".  Asked patient what his goals are after this VAD implant and he responded, "my children"; explaining he wants to live for them. He says he feels he will improve as time goes on and expected to have "trouble in the beginning". I asked him what his thoughts were about life long dialysis and he says he will do what is required to live. Reports his mom was on dialysis and he is familiar with the routine.   Encouraged him to participate more in his recovery by working with PT/OT and pushing himself in order to get stronger. He grunted in response.   Reported issues with alarms on controller while patient was on fully charged batteries yesterday in HD. Placed patient on batteries and tried to replicate alarms/advisories without success. Pt does not remember incident yesterday.   Per Emerson Monte, [place] Coordinator, HD RN, patient's controller has "beeped" a few times during his treatment, but no alarm message on controller with alarm.She reviewed parameter history and found  2 "power cable disconnect" alarms noted on interrogation at 0952 and 1032 x 2. At time of alarms pt was lying in bed on batteries. Controller, battery, and clips have all been exchanged once for alarms since implant.   Event log downloaded and will send to [employer] for review and  recommendations.  Vital signs: Temp: 97.2 HR: 80 - afib Cuff: 101/87 (93) Doppler:  102 O2 Sat: 98% on RA Wt: 253.3>262.3>260.8>259.9>237.8.Marland Kitchen..217.5>224.8>218.7>221.7 lbs   LVAD interrogation reveals:  Speed: 5300 Flow: 4.1 Power: 4.5w  PI: 3.8  Alarms: none Events: none Hematocrit: 24  Fixed speed: 5300 Low speed limit: 5000   Drive Line:CDI. Anchor secure. Dressing changes twice weekly (Monday / Thursday) dressing changes per nurse champion or VAD coordinator.  Next dressing change due 04/21/20.   Labs:  LDH trend: 360>513>482>...283>273>259>243>262>271>241>253>224>225>215>222  INR trend: 1.3>6.2>9>4.4>6.2>2.2....2.8>3.1>2.4>2.3>2.5>2.1>2.1>2.4>2.1>2.2>2.4>2.6  CR: 3.57>3.98>3.18>2.44...1.83>1.88>2.3>5.52>4.54>6.3>4.34>6.4>7.14>8.32>5.59   Anticoagulation Plan: -INR Goal: 1.8 - 2.3  -ASA Dose: 81 mg - will continue due to hx of CVA per Dr. Haroldine Laws  Respiratory: - extubated 4/9/2 - re-intubated 04/06/2020 s/p left hemothorax with cardiac arrest - extubated 04/06/20  Renal:  - CVVH started 03/25/20 - Intermittent HD dialysis started 04/14/20 - 04/16/20 Tunneled HD cath placed  Neuro: - Right MCA CVA on 03/27/20  Nitric Oxide: off 03/23/20  Blood Products:  Intraop: 6 FFP  DDAVP  420 cell saver  04/09/2020>>4 FFP 03/21/20>> 1 RBC 03/22/20>>1 RBC  Intra-op: 03/16/2020>>2 PCs      2 FFP Post-op: 04/04/2020>>3 PC's, 2 FFP 04/14/20>>1 PC's 04/15/20>> 1 PC's 04/18/20>>1 PC's  Cultures:  -03/25/20- blood cultures>>negative - 03/21/2020-blood cultures>>negative - 04/03/20 - respiratory culture>>neg   Device: -St [name] -Therapies: VF 194 - therapy on  Drips: - Milrinone 0.125 mcg/kg/min >> off 04/09/20 - Levo 1 mcg/min>> off 04/10/20 - Amiodarone 30 mg/hr-off - Tube feed: pt refusing  Adverse  Events on VAD: >>03/25/20 CVVH Started >>04/10/2020 left hemothorax; cardiac arrest >>04/14/20 HD  Pt Education:  1. Asked patient what his drive line was connected to and he responded,  "controller". Reviewed controller with patient including three soft keys and meaning of each. Demonstrated display screen and scrolled through VAD parameters; battery button and demonstrated system controller self test; and alarm history by pressing alarm and display buttons at same time.    2. No family at bedside.   Plan/Recommendations:  1. Call VAD coordinator for any equipment or drive line issues.  2. Dressing changes Monday / Thursday per nurse champion or VAD coordinator.   Zada Girt RN [place] Coordinator  Office: [phone]  24/7 Pager: [phone]

## 2020-04-23 NOTE — Progress Notes (Signed)
Occupational Therapy Treatment Patient Details Name: [name] [name] MRN: [id number] DOB: 24-Nov-1951 Today's Date: 04/23/2020    History of present illness 69 yo male with history of CAD with prior stenting of the LAD, Diagonal and OM in an outside hospital, ischemic cardiomyopathy with ICD in place, chronic systolic CHF, MVP s/p mitral valve repair, permanent atrial fib on chronic anticoagulation, HTN, HLD, DM, depression admitted to [facility] with progressive weakness, fatigue, dyspnea and chest pressure. Troponin elevated at 0.06. He was transferred to [facility] for cardiac cath, showing severe 3 vessel disease. Plan for CABG but pt with AKI. Pt underwent multiple tooth extraction on 4/6 and LVAD placement on 4/8. Postop day 7 patient was noted to be agitated the CT head was obtained showing a large posterior right MCA infarct. Pt with arrest on 4/20 and reintubated, extubated 4/25. Pt on CRRT until 04/12/20 when transitioned to HD.Tunnelled HD cath 5/5   OT comments  Pt assisted OOB to chair with +2 assist and use of stedy. Participated in self feeding with min to max assist. Pt with intermittent nausea and reported pain, but did not specify where. Pt continues to need cues for sternal precautions and to place LVAD controller around his neck prior to mobility. Alert and interactive today. Will continue to follow.  Follow Up Recommendations  CIR;Supervision/Assistance - 24 hour    Equipment Recommendations  None recommended by OT    Recommendations for Other Services      Precautions / Restrictions Precautions Precautions: Fall;Sternal Precaution Comments: LVAD, cortrak       Mobility Bed Mobility Overal bed mobility: Needs Assistance Bed Mobility: Supine to Sit     Supine to sit: +2 for physical assistance;Mod assist     General bed mobility comments: cues to sequence, assist for LEs over EOB, to raise trunk and position hips at EOB  Transfers Overall transfer level:  Needs assistance Equipment used: 2 person hand held assist Transfers: Sit to/from Stand Sit to Stand: +2 physical assistance;Mod assist;Min assist         General transfer comment: +2 mod assist to stand from bed, +2 min from stedy, cues for hands on knees    Balance Overall balance assessment: Needs assistance Sitting-balance support: Feet supported Sitting balance-Leahy Scale: Poor Sitting balance - Comments: leaning L, but able to maintain balance briefly with min guard assist     Standing balance-Leahy Scale: Poor Standing balance comment: stood in stedy with +2 min assist, tolerates only for about 10 seconds                           ADL either performed or assessed with clinical judgement   ADL   Eating/Feeding: Sitting;Maximal assistance Eating/Feeding Details (indicate cue type and reason): min to drink with straw, moderate assist for coffee and to eat banana, max for spoon foods                                         Vision       Perception     Praxis      Cognition Arousal/Alertness: Awake/alert Behavior During Therapy: Flat affect Overall Cognitive Status: Impaired/Different from baseline Area of Impairment: Orientation;Memory;Safety/judgement;Following commands;Attention                 Orientation Level: Disoriented to;Time Current Attention Level: Focused Memory: Decreased short-term memory;Decreased  recall of precautions Following Commands: Follows one step commands inconsistently;Follows one step commands with increased time Safety/Judgement: Decreased awareness of deficits Awareness: Intellectual Problem Solving: Slow processing;Decreased initiation;Difficulty sequencing;Requires verbal cues General Comments: pt needing encouragment to participate citing he was cold, pt not able to recall sternal precautions or to place controller around neck        Exercises     Shoulder Instructions       General  Comments      Pertinent Vitals/ Pain       Pain Assessment: Faces Faces Pain Scale: Hurts little more Pain Location: generalized Pain Descriptors / Indicators: Discomfort;Grimacing;Sore Pain Intervention(s): RN gave pain meds during session;Monitored during session  Home Living                                          Prior Functioning/Environment              Frequency  Min 2X/week        Progress Toward Goals  OT Goals(current goals can now be found in the care plan section)  Progress towards OT goals: Progressing toward goals  Acute Rehab OT Goals Patient Stated Goal: to get warmer OT Goal Formulation: With patient Time For Goal Achievement: 05/07/20 Potential to Achieve Goals: [place] Discharge plan remains appropriate    Co-evaluation    PT/OT/SLP Co-Evaluation/Treatment: Yes Reason for Co-Treatment: Complexity of the patient's impairments (multi-system involvement);For patient/therapist safety   OT goals addressed during session: ADL's and self-care      AM-PAC OT "6 Clicks" Daily Activity     Outcome Measure   Help from another person eating meals?: A Lot Help from another person taking care of personal grooming?: A Lot Help from another person toileting, which includes using toliet, bedpan, or urinal?: Total Help from another person bathing (including washing, rinsing, drying)?: Total Help from another person to put on and taking off regular upper body clothing?: Total Help from another person to put on and taking off regular lower body clothing?: Total 6 Click Score: 8    End of Session Equipment Utilized During Treatment: Gait belt  OT Visit Diagnosis: Other abnormalities of gait and mobility (R26.89);Cognitive communication deficit (R41.841) Symptoms and signs involving cognitive functions: Cerebral infarction   Activity Tolerance Patient tolerated treatment well   Patient Left in chair;with call bell/phone within  reach;with nursing/sitter in room(LVAD coordinator in room)   Nurse Communication          Time: 1829-9371 OT Time Calculation (min): 41 min  Charges: OT General Charges $OT Visit: 1 Visit OT Treatments $Self Care/Home Management : 8-22 mins  Nestor Lewandowsky, OTR/L Acute Rehabilitation Services Pager: [phone] Office: [phone]   [name] 04/23/2020, 9:11 AM

## 2020-04-23 NOTE — Progress Notes (Signed)
SLP Cancellation Note  Patient Details Name: LATHYN GRIGGS MRN: [id number] DOB: 07-08-1951   Cancelled treatment:       Reason Eval/Treat Not Completed: Patient at procedure or test/unavailable(Pt off unit for HD. SLP will follow up.)  Arjan Strohm I. Hardin Negus, [place], [place] Office number [phone] Pager Kim 04/23/2020, 1:15 PM

## 2020-04-23 NOTE — Plan of Care (Signed)
  Problem: Education: Goal: Knowledge of General Education information will improve Description: Including pain rating scale, medication(s)/side effects and non-pharmacologic comfort measures Outcome: Progressing   Problem: Clinical Measurements: Goal: Ability to maintain clinical measurements within normal limits will improve Outcome: Progressing Goal: Will remain free from infection Outcome: Progressing Goal: Diagnostic test results will improve Outcome: Progressing Goal: Respiratory complications will improve Outcome: Progressing Goal: Cardiovascular complication will be avoided Outcome: Progressing   Problem: Safety: Goal: Ability to remain free from injury will improve Outcome: Progressing   Problem: Education: Goal: Knowledge of the prescribed therapeutic regimen will improve Outcome: Progressing   Problem: Coping: Goal: Level of anxiety will decrease Outcome: Progressing

## 2020-04-23 NOTE — Progress Notes (Addendum)
Patient ID: [name] [name], male   DOB: 09-18-1951, 69 y.o.   MRN: [id number]    Advanced Heart Failure Rounding Note   Subjective:    - S/p HM3 VAD 4/8 w MAZE procedure + LAA clipping. - Extubated 4/9 - 4/12 LVAD speed increased to 5400 --> echo moderate-severe RV dysfunction, septum mildly left shifted, trivial pericardial effusion.  LVAD speed cut back to 5300.  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  - 4/20 developed hemothorax requiring emergent CT placement and eventual VATS - 4/21 EEG done given concerns for posturing and c/w moderate to severe diffuse encephalopathy. No seizures or epileptiform discharges were seen  - 04/03/20 Pump Stop --> controller change out. CT of head unchanged R MCA CVA - 04/06/20 Extubated - 5/1 CRRT stopped. Had first iHD 5/3.  - 5/4 1uRBC - 5/6 1u RBC  Yesterday carvedilol added.   Denies SOB. Having some chest soreness.   VAD Interrogation  Flow  Speed 5300, Power 4  Pulse Index 4.5   VAD interrogated personally. Parameters stable. Had issue with VAD controlled.    Objective:   Weight Range:  Vital Signs:   Temp:  [97.2 F (36.2 C)-98 F (36.7 C)] 97.2 F (36.2 C) (05/12 0705) Pulse Rate:  [60-93] 61 (05/12 0705) Resp:  [11-18] 18 (05/12 0705) BP: (93-124)/(78-103) 101/87 (05/12 0705) SpO2:  [93 %-99 %] 94 % (05/12 0705) Weight:  [99.2 kg-100.6 kg] 100.6 kg (05/12 0526) Last BM Date: 04/21/20  Weight change: Filed Weights   04/22/20 0700 04/22/20 1034 04/23/20 0526  Weight: 102.3 kg 99.2 kg 100.6 kg    Intake/Output:   Intake/Output Summary (Last 24 hours) at 04/23/2020 0802 Last data filed at 04/22/2020 1718 Gross per 24 hour  Intake 0 ml  Output 1937 ml  Net -1937 ml    Maps 90s    Physical Exam: GENERAL: No acute distress. In bed  HEENT: normal  NECK: Supple, JVP 8-9 .  2+ bilaterally, no bruits.  No lymphadenopathy or thyromegaly appreciated.   CARDIAC:  Mechanical heart sounds with LVAD hum  present.  LUNGS:  Clear to auscultation bilaterally.  ABDOMEN:  Soft, round, nontender, positive bowel sounds x4.     LVAD exit site:  Dressing dry and intact.  No erythema or drainage.  Stabilization device present and accurately applied.  Driveline dressing is being changed daily per sterile technique. EXTREMITIES:  Warm and dry, no cyanosis, clubbing, rash or edema  NEUROLOGIC:  Alert and oriented x3.   No aphasia.  No dysarthria.  Affect pleasant.     Telemetry: A fib 70-90s   Labs: Basic Metabolic Panel: Recent Labs  Lab 04/19/20 0239 04/19/20 0239 04/20/20 0253 04/20/20 0253 04/21/20 0237 04/22/20 0254 04/23/20 0326  NA 135  --  135  --  134* 132* 134*  K 3.9  --  3.8  --  4.3 4.9 4.5  CL 94*  --  94*  --  94* 92* 94*  CO2 26  --  26  --  25 25 26   GLUCOSE 75  --  187*  --  149* 143* 109*  BUN 53*  --  82*  --  112* 141* 73*  CREATININE 4.37*  --  5.80*  --  7.14* 8.32* 5.59*  CALCIUM 8.2*   < > 8.5*   < > 8.6* 8.6* 8.7*  MG 2.3  --  2.7*  --  2.9* 3.1* 2.6*  PHOS 5.1*  --  5.2*  --  4.8* 6.3* 5.4*   < > = values in this interval not displayed.    Liver Function Tests: Recent Labs  Lab 04/19/20 0239 04/20/20 0253 04/21/20 0237 04/22/20 0254 04/23/20 0326  ALBUMIN 2.3* 2.3* 2.3* 2.3* 2.2*   No results for input(s): LIPASE, AMYLASE in the last 168 hours. No results for input(s): AMMONIA in the last 168 hours.  CBC: Recent Labs  Lab 04/18/20 0259 04/19/20 0239 04/20/20 0253 04/21/20 0237 04/22/20 0254  WBC 7.8 9.3 9.1 9.3 8.1  HGB 7.2* 8.6* 8.3* 8.1* 7.6*  HCT 23.0* 26.8* 26.7* 25.6* 24.0*  MCV 88.8 88.4 88.1 86.8 86.6  PLT 261 281 288 300 292    Cardiac Enzymes: No results for input(s): CKTOTAL, CKMB, CKMBINDEX, TROPONINI in the last 168 hours.  BNP: BNP (last 3 results) Recent Labs    04/02/20 2342 04/10/20 0132 04/17/20 0043  BNP 332.7* 267.2* 582.1*    ProBNP (last 3 results) No results for input(s): PROBNP in the last 8760  hours.    Other results:  Imaging: No results found.   Medications:     Scheduled Medications: . sodium chloride   Intravenous Once  . aspirin  81 mg Per Tube Daily  . atorvastatin  80 mg Per Tube q1800  . bisacodyl  10 mg Oral Daily   Or  . bisacodyl  10 mg Rectal Daily  . carvedilol  3.125 mg Oral BID WC  . Chlorhexidine Gluconate Cloth  6 each Topical Daily  . Chlorhexidine Gluconate Cloth  6 each Topical Q0600  . citalopram  10 mg Per Tube Daily  . darbepoetin (ARANESP) injection - NON-DIALYSIS  100 mcg Subcutaneous Q Tue-1800  . feeding supplement (NEPRO CARB STEADY)  237 mL Oral BID BM  . feeding supplement (PRO-STAT SUGAR FREE 64)  30 mL Per Tube BID  . influenza vaccine adjuvanted  0.5 mL Intramuscular Tomorrow-1000  . insulin aspart  0-24 Units Subcutaneous Q4H  . insulin aspart  6 Units Subcutaneous Q4H  . insulin glargine  24 Units Subcutaneous BID  . multivitamin  1 tablet Oral QHS  . pantoprazole (PROTONIX) IV  40 mg Intravenous Daily  . sodium chloride flush  10-40 mL Intracatheter Q12H  . Warfarin - Pharmacist Dosing Inpatient   Does not apply q1600    Infusions: . sodium chloride Stopped (04/15/20 1731)  . feeding supplement (NEPRO CARB STEADY) Stopped (04/21/20 2307)    PRN Medications: sodium chloride, acetaminophen, dextrose, heparin sodium (porcine), hydrALAZINE, levalbuterol, ondansetron (ZOFRAN) IV, oxyCODONE, promethazine, sodium chloride flush, sorbitol, traMADol   Assessment/Plan:   1. Acute on chronic systolic HF -> cardiogenic shock-> S/p HM3 LVAD - Echo 2016 EF 30-35% - Echo 1/21 EF 25% - Admitted with NYHA IV symptoms and AKI with attempts at diuresis. Initial co-ox 39% - Echo this admit: EF 10-15% moderate RV dysfunction - R/LHC cath 3/21 with severe 3v CAD and low output with CI 1.7.  - Initially planned for CABG but given need for re-do sternotomy, relatively poor targets and longstanding low EF, VAD felt to be better option  -  S/p HM3 VAD 4/8  - Continue carvedilol 3.125 mg twice a day.   - Volume status managed by HD  - HD today per nephrology.   2. VAD - VAD interrogated personally. Parameters stable. - Had Pump Stop 04/03/20. Controller changed out with old controller sent back to [facility] for evaluation.  - on aspirin 81 mg daily + coumadin.  - LDH 222 -  INR 2.6 . We have lowered INR goal to 1.8-2.3 given anemia. Discussed dosing with PharmD personally.  3. CAD with unstable angina - s/p previous PCI. - cath 02/14/2020 with severe 3v CAD - now s/p VAD on 03/24/2020   - No s/s ischemia  4. AKI on CKD 3a -> ESRD - Nephrology consulted. Started CVVHD 4/13.    - Renal US 4/5 unremarkable.  - Hopefully renal function will recover but Renal team not optimistic - He is now off NE w/ stable MAP.  - Off CVVH.  1st iHD 5/3.  -  Tunnel HD cath placed 5/5.  - Tolerating iHD  5. Permanent AF - now s/p MAZE + LAA Clipping 4/8   -  Add Carvedilol 3.125 mg twice a day  - On coumadin. INR 2.6 . Discussed coumadin dose with pharmacy. Hold tonight.   6. MVP s/p MV repair - On admit with recurrent severe MR on echo and with huge v-waves on PCWP tracing - s/p VAD  7. Hemothoax - s/p CT placement and VATs on 4/22 - CTs pulled 4/26 -Resolved   8. ID/leukocytosis - 4/11 procalcitonin 1.96  - Started empiric coverage for PNA with vancomycin/cefepime for 7 days. Stopped 4/17 - Given recent VATs and high-dose pressor requirements he was treated with broad spectrum abx, linezolid and cefepime (completed course 4/28)  - CBC pending.   9. Acute Hypoxic Respiratory Failure - Re-intubated 4/20 with hemothorax- - Extubated 4/25 - On room air.   10. Malnutrition  - Albumin 2.3 - Remains on tube feeds.  - Speech following--> placed on thin liquid dysphagia 1  11. CVA/anoxic brain injury - 03/27/20 CT large posterior MCA ischemic infarct, possibly from atrial fibrillation.  INR was supratherapeutic when CVA found.   Stable LDH, do not think partial pump thrombosis is the culprit. - Still with some L weakness and inattention but improving  - PT/OT/speech following.  - CIR consulted.    12. Anemia, post-op  - Received 2u RBC 04/05/20 + 1 Unit on 5/3 + 1 unit 5/4 + 1u 5/7 - hgb  Slowly trending down 8.6>8.3>8.1>7.6 > CBC pending.  - lower INR goal to 1.8-2.3  13. Hyperkalemia - resolved  - Continue iHD   14. DM2, poorly controlled - hgbA1c 11.1% - continue insulin   15. Agitation/ Depression  - Celexa 10 mg daily added.   16. Deconditioning - Continue PT/OT . Discussed the importance of working with therapy.   -CIR recommended.   17. Lower ab pain - kub ok. Bladder scan ok  - Resolved.   PT/OT following. Aim for CIR.   Length of Stay: [place], NP  8:02 AM  04/23/2020   Patient seen and examined with the above-signed Advanced Practice Provider and/or Housestaff. I personally reviewed laboratory data, imaging studies and relevant notes. I independently examined the patient and formulated the important aspects of the plan. I have edited the note to reflect any of my changes or salient points. I have personally discussed the plan with the patient and/or family.  Remains very weak but able to get OOB to chair today with PT assistance so is making some progress. Had some nausea earlier but resolved. Underwent HD today as well. Tolerated well. INR 2.6 with no bleeding. VAD interrogated personally. Parameters stable. MAPs stable. Having controller issues - log files sent to [facility].   General:  NAD.  HEENT: normal  + cor=trak Neck: supple. JVP not elevated.  Carotids 2+ bilat; no  bruits. No lymphadenopathy or thryomegaly appreciated. Cor: LVAD hum.  Lungs: Clear. Abdomen:soft, nontender, non-distended. No hepatosplenomegaly. No bruits or masses. Good bowel sounds. Driveline site clean. Anchor in place.  Extremities: no cyanosis, clubbing, rash. Warm no edema  Neuro: alert & oriented x  3. No focal deficits. Moves all 4 without problem   Remains quite weak but able to get to chair today. Volume status improved with HD. Tolerated well. MAPs stable. INR 2.6. Discussed dosing with PharmD personally. Controller issues d/w VAD coordinators.   Glori Bickers, MD  9:50 PM

## 2020-04-24 ENCOUNTER — Telehealth (HOSPITAL_COMMUNITY): Payer: Self-pay | Admitting: Licensed Clinical Social Worker

## 2020-04-24 LAB — CBC
HCT: 27.1 % — ABNORMAL LOW (ref 39.0–52.0)
Hemoglobin: 8.4 g/dL — ABNORMAL LOW (ref 13.0–17.0)
MCH: 27.4 pg (ref 26.0–34.0)
MCHC: 31 g/dL (ref 30.0–36.0)
MCV: 88.3 fL (ref 80.0–100.0)
Platelets: 316 10*3/uL (ref 150–400)
RBC: 3.07 MIL/uL — ABNORMAL LOW (ref 4.22–5.81)
RDW: 15.4 % (ref 11.5–15.5)
WBC: 7.5 10*3/uL (ref 4.0–10.5)
nRBC: 0 % (ref 0.0–0.2)

## 2020-04-24 LAB — RENAL FUNCTION PANEL
Albumin: 2.4 g/dL — ABNORMAL LOW (ref 3.5–5.0)
Anion gap: 13 (ref 5–15)
BUN: 39 mg/dL — ABNORMAL HIGH (ref 8–23)
CO2: 27 mmol/L (ref 22–32)
Calcium: 8.5 mg/dL — ABNORMAL LOW (ref 8.9–10.3)
Chloride: 93 mmol/L — ABNORMAL LOW (ref 98–111)
Creatinine, Ser: 4.17 mg/dL — ABNORMAL HIGH (ref 0.61–1.24)
GFR calc Af Amer: 16 mL/min — ABNORMAL LOW (ref >60)
GFR calc non Af Amer: 14 mL/min — ABNORMAL LOW (ref >60)
Glucose, Bld: 77 mg/dL (ref 70–99)
Phosphorus: 4.5 mg/dL (ref 2.5–4.6)
Potassium: 3.7 mmol/L (ref 3.5–5.1)
Sodium: 133 mmol/L — ABNORMAL LOW (ref 135–145)

## 2020-04-24 LAB — HEAVY METALS, BLOOD
Arsenic: 5 ug/L (ref 2–23)
Lead: 1 ug/dL (ref 0–4)
Mercury: <1 ug/L (ref 0.0–14.9)

## 2020-04-24 LAB — GLUCOSE, CAPILLARY
Glucose-Capillary: 70 mg/dL (ref 70–99)
Glucose-Capillary: 70 mg/dL (ref 70–99)
Glucose-Capillary: 158 mg/dL — ABNORMAL HIGH (ref 70–99)
Glucose-Capillary: 170 mg/dL — ABNORMAL HIGH (ref 70–99)
Glucose-Capillary: 178 mg/dL — ABNORMAL HIGH (ref 70–99)

## 2020-04-24 LAB — PROTIME-INR
INR: 3 — ABNORMAL HIGH (ref 0.8–1.2)
Prothrombin Time: 29.9 seconds — ABNORMAL HIGH (ref 11.4–15.2)

## 2020-04-24 LAB — LACTATE DEHYDROGENASE: LDH: 238 U/L — ABNORMAL HIGH (ref 98–192)

## 2020-04-24 LAB — MAGNESIUM: Magnesium: 2.2 mg/dL (ref 1.7–2.4)

## 2020-04-24 MED ORDER — CHLORHEXIDINE GLUCONATE CLOTH 2 % EX PADS
6.0000 | MEDICATED_PAD | Freq: Every day | CUTANEOUS | Status: DC
  Administered 2020-04-27: 6 via TOPICAL

## 2020-04-24 NOTE — Telephone Encounter (Signed)
CSW left message for patient's daughter to return call regarding visiting patient today. CSW will await call from daughter. [name], [place], [place]

## 2020-04-24 NOTE — Progress Notes (Signed)
Pt refusing to interact this AM, keeping eyes closed during all attempts at conversation, refusing breakfast despite repeated attempts by this RN to get him to eat due to glucose level of 70. Refusing doppler pressures because he "doest want to hear it" Pt complains of pain but when pain meds. offered by this RN patient refusing to acknowledge offer.

## 2020-04-24 NOTE — Progress Notes (Addendum)
Patient ID: KALLAN MERRICK, male   DOB: Jul 18, 1951, 69 y.o.   MRN: [id number]    Advanced Heart Failure Rounding Note   Subjective:    - S/p HM3 VAD 4/8 w MAZE procedure + LAA clipping. - Extubated 4/9 - 4/12 LVAD speed increased to 5400 --> echo moderate-severe RV dysfunction, septum mildly left shifted, trivial pericardial effusion.  LVAD speed cut back to 5300.  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  - 4/20 developed hemothorax requiring emergent CT placement and eventual VATS - 4/21 EEG done given concerns for posturing and c/w moderate to severe diffuse encephalopathy. No seizures or epileptiform discharges were seen  - 04/03/20 Pump Stop --> controller change out. CT of head unchanged R MCA CVA - 04/06/20 Extubated - 5/1 CRRT stopped. Had first iHD 5/3.  - 5/4 1uRBC - 5/6 1u RBC  Not participating with therapy. Per nursing staff he is refusing tube feeds.   Says "I guess its ok to do hemodialysis".  Denies SOB. Says he is tired.   VAD Interrogation  Flow  Speed 5300, Power 4  Pulse Index 4   VAD interrogated personally. Parameters stable. Had issue with VAD controlled.    Objective:   Weight Range:  Vital Signs:   Temp:  [97 F (36.1 C)-98.2 F (36.8 C)] 97.7 F (36.5 C) (05/13 0716) Pulse Rate:  [50-104] 80 (05/13 0716) Resp:  [14-18] 15 (05/13 0716) BP: (86-163)/(34-116) 111/99 (05/13 0716) SpO2:  [95 %-100 %] 99 % (05/13 0716) Weight:  [97.3 kg-101.4 kg] 98.3 kg (05/13 0400) Last BM Date: 04/21/20  Weight change: Filed Weights   04/23/20 1235 04/23/20 1647 04/24/20 0400  Weight: 101.4 kg 97.3 kg 98.3 kg    Intake/Output:   Intake/Output Summary (Last 24 hours) at 04/24/2020 1000 Last data filed at 04/23/2020 2313 Gross per 24 hour  Intake 40 ml  Output 3000 ml  Net -2960 ml    Maps 80s     Physical Exam: GENERAL: In bed. No acute distress. HEENT: normal  NECK: Supple, JVP 6-7  .  2+ bilaterally, no bruits.  No lymphadenopathy  or thyromegaly appreciated.   CARDIAC:  Mechanical heart sounds with LVAD hum present.  LUNGS:  Clear to auscultation bilaterally.  ABDOMEN:  Soft, round, nontender, positive bowel sounds x4.     LVAD exit site: Dressing dry and intact.  No erythema or drainage.  Stabilization device present and accurately applied.  Driveline dressing is being changed daily per sterile technique. EXTREMITIES:  Warm and dry, no cyanosis, clubbing, rash or edema  NEUROLOGIC:  Alert and oriented x 3. No aphasia.  No dysarthria.  Affect flat     Telemetry: A fib 70-90s   Labs: Basic Metabolic Panel: Recent Labs  Lab 04/20/20 0253 04/20/20 0253 04/21/20 0237 04/21/20 0237 04/22/20 0254 04/23/20 0326 04/24/20 0831  NA 135  --  134*  --  132* 134* 133*  K 3.8  --  4.3  --  4.9 4.5 3.7  CL 94*  --  94*  --  92* 94* 93*  CO2 26  --  25  --  25 26 27   GLUCOSE 187*  --  149*  --  143* 109* 77  BUN 82*  --  112*  --  141* 73* 39*  CREATININE 5.80*  --  7.14*  --  8.32* 5.59* 4.17*  CALCIUM 8.5*   < > 8.6*   < > 8.6* 8.7* 8.5*  MG 2.7*  --  2.9*  --  3.1* 2.6* 2.2  PHOS 5.2*  --  4.8*  --  6.3* 5.4* 4.5   < > = values in this interval not displayed.    Liver Function Tests: Recent Labs  Lab 04/20/20 0253 04/21/20 0237 04/22/20 0254 04/23/20 0326 04/24/20 0831  ALBUMIN 2.3* 2.3* 2.3* 2.2* 2.4*   No results for input(s): LIPASE, AMYLASE in the last 168 hours. No results for input(s): AMMONIA in the last 168 hours.  CBC: Recent Labs  Lab 04/20/20 0253 04/21/20 0237 04/22/20 0254 04/23/20 1155 04/24/20 0831  WBC 9.1 9.3 8.1 7.9 7.5  HGB 8.3* 8.1* 7.6* 8.2* 8.4*  HCT 26.7* 25.6* 24.0* 25.9* 27.1*  MCV 88.1 86.8 86.6 87.5 88.3  PLT 288 300 292 315 316    Cardiac Enzymes: No results for input(s): CKTOTAL, CKMB, CKMBINDEX, TROPONINI in the last 168 hours.  BNP: BNP (last 3 results) Recent Labs    04/02/20 2342 04/10/20 0132 04/17/20 0043  BNP 332.7* 267.2* 582.1*    ProBNP (last  3 results) No results for input(s): PROBNP in the last 8760 hours.    Other results:  Imaging: DG Chest Port 1 View  Result Date: 04/23/2020 CLINICAL DATA:  LVAD. EXAM: PORTABLE CHEST 1 VIEW COMPARISON:  Chest x-ray dated Apr 15, 2020. FINDINGS: New tunneled right internal jugular dialysis catheter with interval removal of the previously seen right subclavian central venous catheter. Unchanged feeding tube, left chest wall pacemaker, and LVAD. Stable cardiomegaly status post left atrial appendage clipping. Unchanged airspace disease in the lingula and left lower lobe. The right lung is clear. No pneumothorax or large pleural effusion. No acute osseous abnormality. IMPRESSION: 1. Unchanged lingular and left lower lobe atelectasis versus infiltrate. Electronically Signed   By: Titus Dubin M.D.   On: 04/23/2020 08:45     Medications:     Scheduled Medications: . sodium chloride   Intravenous Once  . aspirin  81 mg Per Tube Daily  . atorvastatin  80 mg Per Tube q1800  . bisacodyl  10 mg Oral Daily   Or  . bisacodyl  10 mg Rectal Daily  . carvedilol  3.125 mg Oral BID WC  . Chlorhexidine Gluconate Cloth  6 each Topical Daily  . Chlorhexidine Gluconate Cloth  6 each Topical Q0600  . citalopram  10 mg Per Tube Daily  . darbepoetin (ARANESP) injection - NON-DIALYSIS  100 mcg Subcutaneous Q Tue-1800  . feeding supplement (NEPRO CARB STEADY)  237 mL Oral BID BM  . feeding supplement (PRO-STAT SUGAR FREE 64)  30 mL Per Tube BID  . influenza vaccine adjuvanted  0.5 mL Intramuscular Tomorrow-1000  . insulin aspart  0-24 Units Subcutaneous Q4H  . insulin aspart  6 Units Subcutaneous Q4H  . insulin glargine  24 Units Subcutaneous BID  . multivitamin  1 tablet Oral QHS  . pantoprazole (PROTONIX) IV  40 mg Intravenous Daily  . sodium chloride flush  10-40 mL Intracatheter Q12H  . Warfarin - Pharmacist Dosing Inpatient   Does not apply q1600    Infusions: . sodium chloride Stopped  (04/15/20 1731)  . feeding supplement (NEPRO CARB STEADY) Stopped (04/21/20 2307)    PRN Medications: sodium chloride, acetaminophen, dextrose, heparin sodium (porcine), hydrALAZINE, levalbuterol, ondansetron (ZOFRAN) IV, oxyCODONE, promethazine, sodium chloride flush, sorbitol, traMADol   Assessment/Plan:   1. Acute on chronic systolic HF -> cardiogenic shock-> S/p HM3 LVAD - Echo 2016 EF 30-35% - Echo 1/21 EF 25% - Admitted  with NYHA IV symptoms and AKI with attempts at diuresis. Initial co-ox 39% - Echo this admit: EF 10-15% moderate RV dysfunction - R/LHC cath 3/21 with severe 3v CAD and low output with CI 1.7.  - Initially planned for CABG but given need for re-do sternotomy, relatively poor targets and longstanding low EF, VAD felt to be better option  - S/p HM3 VAD 4/8  - Continue current dose of carvedilol 3.125 mg twice a day.   - Volume status managed by HD  - HD today per nephrology.   2. VAD - VAD interrogated personally. Parameters stable. - Had Pump Stop 04/03/20. Controller changed out with old controller sent back to [facility] for evaluation.  - on aspirin 81 mg daily + coumadin.  - LDH 238 - INR continues to rise likely influenced by poor po intake. Last dose of coumadin was 5/11 . We have lowered INR goal to 1.8-2.3 given anemia. Discussed dosing with PharmD personally.  3. CAD with unstable angina - s/p previous PCI. - cath 02/27/2020 with severe 3v CAD - now s/p VAD on 03/22/2020   - No s/s ischemia  4. AKI on CKD 3a -> ESRD - Nephrology consulted. Started CVVHD 4/13.    - Renal US 4/5 unremarkable.  - Hopefully renal function will recover but Renal team not optimistic - He is now off NE w/ stable MAP.  - Off CVVH.  1st iHD 5/3.  -  Tunnel HD cath placed 5/5.  - Tolerating iHD  5. Permanent AF - now s/p MAZE + LAA Clipping 4/8   -  Add Carvedilol 3.125 mg twice a day  - On coumadin. INR 3 . Continue to hold coumadin.    6. MVP s/p MV repair - On admit  with recurrent severe MR on echo and with huge v-waves on PCWP tracing - s/p VAD  7. Hemothoax - s/p CT placement and VATs on 4/22 - CTs pulled 4/26 -Resolved   8. ID/leukocytosis - 4/11 procalcitonin 1.96  - Started empiric coverage for PNA with vancomycin/cefepime for 7 days. Stopped 4/17 - Given recent VATs and high-dose pressor requirements he was treated with broad spectrum abx, linezolid and cefepime (completed course 4/28)  -Stable   9. Acute Hypoxic Respiratory Failure - Re-intubated 4/20 with hemothorax- - Extubated 4/25 - On room air.   10. Severe Protein Malnutrition  - Albumin 2.4 - Needs to resume tube feeds. Discussed with him the importance.   - Speech following--> placed on thin liquid dysphagia 1  11. CVA/anoxic brain injury - 03/27/20 CT large posterior MCA ischemic infarct, possibly from atrial fibrillation.  INR was supratherapeutic when CVA found.  Stable LDH, do not think partial pump thrombosis is the culprit. - Still with some L weakness and inattention but improving  - PT/OT/speech following.  - CIR consulted.    12. Anemia, post-op  - Received 2u RBC 04/05/20 + 1 Unit on 5/3 + 1 unit 5/4 + 1u 5/7 - hgb  Slowly trending down 8.6>8.3>8.1>7.6 > 8.4 .  - lower INR goal to 1.8-2.3  13. Hyperkalemia - resolved  - Continue iHD   14. DM2, poorly controlled - hgbA1c 11.1% - continue insulin   15. Agitation/ Depression  - Celexa 10 mg daily added.  16. Deconditioning - Continue PT/OT . Discussed the importance of working with therapy.   -CIR recommended.   17. Lower ab pain - kub ok. Bladder scan ok  - Resolved.   We will need to ask Palliative  Care and HFSW to talk to him. Needs [place] conversation. If he is not going participate in therapy/nutrition and LVAD education will need to start looking at SNF.     Length of Stay: [place], NP  10:00 AM  04/24/2020    Patient seen and examined with the above-signed Advanced Practice Provider  and/or Housestaff. I personally reviewed laboratory data, imaging studies and relevant notes. I independently examined the patient and formulated the important aspects of the plan. I have edited the note to reflect any of my changes or salient points. I have personally discussed the plan with the patient and/or family.  He is more alert today after having HD for past several days. Still with some nausea. Wants Alka-setzer Denies SOB. INR 3.0 No bleeding. VAD interrogated personally. Parameters stable.  General:  Lying in bed NAD.  HEENT: normal  Neck: supple. JVP not elevated.  Carotids 2+ bilat; no bruits. No lymphadenopathy or thryomegaly appreciated. Cor: LVAD hum.  + tunneled cath Lungs: Clear. Abdomen: obese soft, nontender, non-distended. No hepatosplenomegaly. No bruits or masses. Good bowel sounds. Driveline site clean. Anchor in place.  Extremities: no cyanosis, clubbing, rash. Warm no edema  Neuro: alert & oriented x 3. No focal deficits. Moves all 4 without problem   Long talk with him and his step daughter with SW. We talked about his goals and wishes. WE made it clear that if he is unwilling to participate in therapy and other activities he will likely not survive. He was very clear with [place] that he wants to try and get better. We formulated a plan with him and his stepdaughter with clear goals. WIll d/w PT/OT.   Continue aggressive care. VAD interrogated personally. Parameters stable. INR 3.0 Discussed dosing with PharmD personally.  Glori Bickers, MD  7:23 PM

## 2020-04-24 NOTE — Progress Notes (Addendum)
CBG 70, patient encouraged to drink juice or soda to prevent it from dropping lower, patient refuses d/t nausea.  PRN zofran provided. Patient continues to refuse anything PO.  Agreed to eat ice cream later if left at the bedside after becoming verbally aggressive to staff.  Will continue to monitor.   Patient also refusing morning CHG bath. Repeatedly states "just leave me alone".

## 2020-04-24 NOTE — Progress Notes (Signed)
Calorie Count Note: Day 1 Results  Calorie count ordered and started 04/23/20 at 0700. Pt continues to refuse tube feeds.  Diet: Dysphagia 3, thin liquids Supplements: - Nepro Shake po BID, each supplement provides 425 kcal and 19 grams protein - Magic Cup TID with meals, each supplement provides 290 kcal and 9 grams of protein  04/23/20: Breakfast: 125 kcal, 2 grams protein Lunch: pt refused Dinner: pt refused Supplements: pt refused all supplements  Total 24-hour PO intake: 125 kcal (5% of minimum estimated needs)  2 grams protein (2% of minimum estimated needs)  Nutrition Diagnosis: Increased nutrient needs related to post-op healing, chronic illness as evidenced by estimated needs.  Goal: Patient will meet greater than or equal to 90% of their needs  Intervention: - Continue calorie count - Continue Nepro Shake BID - Continue Magic Cup TID - Continue rena-vit - Recommend continuing TF via Cortrak until pt demonstrating consistent and adequate po intake. Pt refusing TF at this time.  Tube Feeding Recommendations: Nepro at 50 ml/hr Pro-Stat 30 mL BID   Gaynell Face, MS, RD, LDN Inpatient Clinical Dietitian Pager: [phone] Weekend/After Hours: [phone]

## 2020-04-24 NOTE — Plan of Care (Signed)
  Problem: Education: Goal: Knowledge of General Education information will improve Description: Including pain rating scale, medication(s)/side effects and non-pharmacologic comfort measures Outcome: Progressing   Problem: Health Behavior/Discharge Planning: Goal: Ability to manage health-related needs will improve Outcome: Progressing   Problem: Clinical Measurements: Goal: Ability to maintain clinical measurements within normal limits will improve Outcome: Progressing Goal: Will remain free from infection Outcome: Progressing Goal: Diagnostic test results will improve Outcome: Progressing Goal: Respiratory complications will improve Outcome: Progressing Goal: Cardiovascular complication will be avoided Outcome: Progressing   Problem: Nutrition: Goal: Adequate nutrition will be maintained Outcome: Progressing   Problem: Elimination: Goal: Will not experience complications related to bowel motility Outcome: Progressing   Problem: Safety: Goal: Ability to remain free from injury will improve Outcome: Progressing   Problem: Skin Integrity: Goal: Risk for impaired skin integrity will decrease Outcome: Progressing

## 2020-04-24 NOTE — Progress Notes (Signed)
SLP Cancellation Note  Patient Details Name: [name] [name] MRN: [id number] DOB: 13-May-1951   Cancelled treatment:       Reason Eval/Treat Not Completed: Patient declined, no reason specified(Pt was approached again for treatment. He was minimally conversant with the SLP and intermittently moaned. Pt denied pain and refused any p.o. intake. [name] Low, RN indicated that the pt has been refusing p.o. intake today and per chart, p.o. intake has been minimal. SLP will follow up on subsequent date, but pt appears notably less motivated than when a p.o. diet was initiated on 04/17/20.)  Rhylynn Perdomo I. Hardin Negus, [place], [place] Office number [phone] Pager [phone]  Horton Marshall 04/24/2020, 2:17 PM

## 2020-04-24 NOTE — Progress Notes (Signed)
SLP Cancellation Note  Patient Details Name: LASZLO ELLERBY MRN: [id number] DOB: 06-Jan-1951   Cancelled treatment:       Reason Eval/Treat Not Completed: Patient declined, no reason specified(Pt reported that he was nauseated and therefore refused p.o. intake. SLP will follow up later today as able.)  Carmelia Tiner I. Hardin Negus, [place], [place] Office number [phone] Pager [place] 04/24/2020, 8:51 AM

## 2020-04-24 NOTE — Progress Notes (Signed)
Pt agreeable to let RN re-start tube feeds, Nepro started at 2 and RN will titrate up as tolerated to goal rate of 50.

## 2020-04-24 NOTE — Progress Notes (Signed)
Patient ID: [name] [name], male   DOB: 05-23-1951, 69 y.o.   MRN: [id number]    S: HD completed yesterday -  Removed 3000- tolerated well.  The plan is for HD again on Friday-  Have not had a meaningful conversation with him this week- I think he feigns sleep to get people to leave him alone - labs not done this AM   O:BP (!) 111/99 (BP Location: Left Arm)   Pulse 80   Temp 97.7 F (36.5 C) (Axillary)   Resp 15   Ht 6' (1.829 m)   Wt 98.3 kg   SpO2 99%   BMI 29.39 kg/m   Intake/Output Summary (Last 24 hours) at 04/24/2020 0726 Last data filed at 04/23/2020 2313 Gross per 24 hour  Intake 40 ml  Output 3000 ml  Net -2960 ml   Intake/Output: I/O last 3 completed shifts: In: 40 [I.V.:40] Out: 3000 [Other:3000]  Intake/Output this shift:  No intake/output data recorded. Weight change: 2.2 kg Gen: resting in bed in NAD CVS: mechanical hum Resp: cta Abd: +BS< soft, nt/nd Ext: trace edema  Recent Labs  Lab 04/18/20 0259 04/19/20 0239 04/20/20 0253 04/21/20 0237 04/22/20 0254 04/23/20 0326  NA 135 135 135 134* 132* 134*  K 4.1 3.9 3.8 4.3 4.9 4.5  CL 95* 94* 94* 94* 92* 94*  CO2 25 26 26 25 25 26   GLUCOSE 144* 75 187* 149* 143* 109*  BUN 89* 53* 82* 112* 141* 73*  CREATININE 6.40* 4.37* 5.80* 7.14* 8.32* 5.59*  ALBUMIN 2.3* 2.3* 2.3* 2.3* 2.3* 2.2*  CALCIUM 8.2* 8.2* 8.5* 8.6* 8.6* 8.7*  PHOS 5.8* 5.1* 5.2* 4.8* 6.3* 5.4*   Liver Function Tests: Recent Labs  Lab 04/21/20 0237 04/22/20 0254 04/23/20 0326  ALBUMIN 2.3* 2.3* 2.2*   No results for input(s): LIPASE, AMYLASE in the last 168 hours. No results for input(s): AMMONIA in the last 168 hours. CBC: Recent Labs  Lab 04/19/20 0239 04/19/20 0239 04/20/20 0253 04/20/20 0253 04/21/20 0237 04/22/20 0254 04/23/20 1155  WBC 9.3   < > 9.1   < > 9.3 8.1 7.9  HGB 8.6*   < > 8.3*   < > 8.1* 7.6* 8.2*  HCT 26.8*   < > 26.7*   < > 25.6* 24.0* 25.9*  MCV 88.4  --  88.1  --  86.8 86.6 87.5  PLT 281   < >  288   < > 300 292 315   < > = values in this interval not displayed.   Cardiac Enzymes: No results for input(s): CKTOTAL, CKMB, CKMBINDEX, TROPONINI in the last 168 hours. CBG: Recent Labs  Lab 04/23/20 1126 04/23/20 1933 04/23/20 2111 04/23/20 2352 04/24/20 0346  GLUCAP 140* 90 79 83 70    Iron Studies: No results for input(s): IRON, TIBC, TRANSFERRIN, FERRITIN in the last 72 hours. Studies/Results: DG Chest Port 1 View  Result Date: 04/23/2020 CLINICAL DATA:  LVAD. EXAM: PORTABLE CHEST 1 VIEW COMPARISON:  Chest x-ray dated Apr 15, 2020. FINDINGS: New tunneled right internal jugular dialysis catheter with interval removal of the previously seen right subclavian central venous catheter. Unchanged feeding tube, left chest wall pacemaker, and LVAD. Stable cardiomegaly status post left atrial appendage clipping. Unchanged airspace disease in the lingula and left lower lobe. The right lung is clear. No pneumothorax or large pleural effusion. No acute osseous abnormality. IMPRESSION: 1. Unchanged lingular and left lower lobe atelectasis versus infiltrate. Electronically Signed   By: Orville Govern.D.  On: 04/23/2020 08:45   . sodium chloride   Intravenous Once  . aspirin  81 mg Per Tube Daily  . atorvastatin  80 mg Per Tube q1800  . bisacodyl  10 mg Oral Daily   Or  . bisacodyl  10 mg Rectal Daily  . carvedilol  3.125 mg Oral BID WC  . Chlorhexidine Gluconate Cloth  6 each Topical Daily  . Chlorhexidine Gluconate Cloth  6 each Topical Q0600  . citalopram  10 mg Per Tube Daily  . darbepoetin (ARANESP) injection - NON-DIALYSIS  100 mcg Subcutaneous Q Tue-1800  . feeding supplement (NEPRO CARB STEADY)  237 mL Oral BID BM  . feeding supplement (PRO-STAT SUGAR FREE 64)  30 mL Per Tube BID  . influenza vaccine adjuvanted  0.5 mL Intramuscular Tomorrow-1000  . insulin aspart  0-24 Units Subcutaneous Q4H  . insulin aspart  6 Units Subcutaneous Q4H  . insulin glargine  24 Units  Subcutaneous BID  . multivitamin  1 tablet Oral QHS  . pantoprazole (PROTONIX) IV  40 mg Intravenous Daily  . sodium chloride flush  10-40 mL Intracatheter Q12H  . Warfarin - Pharmacist Dosing Inpatient   Does not apply q1600    BMET    Component Value Date/Time   NA 134 (L) 04/23/2020 0326   NA 132 (L) 03/07/2020 1501   K 4.5 04/23/2020 0326   CL 94 (L) 04/23/2020 0326   CO2 26 04/23/2020 0326   GLUCOSE 109 (H) 04/23/2020 0326   BUN 73 (H) 04/23/2020 0326   BUN 16 03/07/2020 1501   CREATININE 5.59 (H) 04/23/2020 0326   CREATININE 1.29 (H) 12/10/2016 1011   CALCIUM 8.7 (L) 04/23/2020 0326   GFRNONAA 9 (L) 04/23/2020 0326   GFRNONAA 57 (L) 01/28/2015 1558   GFRAA 11 (L) 04/23/2020 0326   GFRAA 66 01/28/2015 1558   CBC    Component Value Date/Time   WBC 7.9 04/23/2020 1155   RBC 2.96 (L) 04/23/2020 1155   HGB 8.2 (L) 04/23/2020 1155   HGB 15.8 04/24/2018 1148   HCT 25.9 (L) 04/23/2020 1155   HCT 45.9 04/24/2018 1148   PLT 315 04/23/2020 1155   PLT 281 04/24/2018 1148   MCV 87.5 04/23/2020 1155   MCV 89 04/24/2018 1148   MCH 27.7 04/23/2020 1155   MCHC 31.7 04/23/2020 1155   RDW 15.6 (H) 04/23/2020 1155   RDW 14.2 04/24/2018 1148   LYMPHSABS 0.4 (L) 04/02/2020 0429   LYMPHSABS 1.1 04/24/2018 1148   MONOABS 2.2 (H) 04/02/2020 0429   EOSABS 0.0 04/02/2020 0429   EOSABS 0.2 04/24/2018 1148   BASOSABS 0.1 04/02/2020 0429   BASOSABS 0.1 04/24/2018 1148   Assessment/Plan:  1. A on CKD (crt 1.5 on 03/07/20) in setting of cardiogenic shock, pressors, s/p LVAD 03/23/2020. CRRT initiated on 03/25/20 and stopped 04/12/20.Transitioned to intermittent HD 04/14/20.  Has been RRT dep now for a month, do not suspect he will recover renal function  1. Plan for HD  MWF-  Not exactly a candidate for OP right now - too somnolent, or just not cooperative- not sure he will be able to sit up for HD  2. No heparin with HD on systemic anticoagulation  2. Vascular access- RIJ TDC placed  04/16/20 by IR. 1. He would need an AVF/AVG when he is more stable/ready for discharge-  still looks pretty ill- and not cooperating- may not be a candidate for it yet or at all  3. sCHF s/p LVAD 03/22/2020 4.  A fib s/p MAZE and LAA clipping 4/8 5. R MCA ischemic stroke- large, seen on CT on 03/27/20. 6. Left hemothorax s/p VATS 04/02/2020. 7. Anemia-s/p transfusion 04/14/20 and 5/7.Continue to follow, transfuse PRN.  on ESA. 8. Bones-  Check pth - first day phos was high-  Had been controlled with HD    [place] [phone]

## 2020-04-24 NOTE — Progress Notes (Signed)
CSW met at bedside with patient and step daughter Ander Purpura. Patient was lying in bed but awake and engaged in conversation. CSW discussed recent family issues and calls from son Jenny Reichmann. CSW clarified with patient what his wishes are regarding decision making and completion of AD. Patient again confirmed that he wants [name] to be his caregiver and Media planner. He states "If my life was on the line she would be the only one to get me out of it". Patient states "I can't handle the stress from [place] right now". CSW discussed completing an Advanced Directive to clarify his healthcare wishes and patient is agreeable. CSW will pursue early next week per patient wishes.  CSW also discussed current health concerns and lack of participation in his recovery. CSW shared that patient is refusing PT/OT, not eating any meals and has been obstinate with the staff and refused or demanded acre from the nursing staff. CSW explained that given his current state and lack of mobility and ability to care for self he is heading in the direction of skilled nursing placement along with dialysis 3-4x weekly. Patient acknowledged his behavior and states he wants to get better and does not want to go to a SNF. Patient agreed to improved mood with the staff, improved eating and will participate in PT/OT. [name] was very supportive to patient and agreed to have daily visits by family to help better support patient in his goals for recovery. [name] states she will bring some food from home to help jump start his appetite. [name] appears very supportive and aware of the potential needs post discharge form the hospital should patient return home. Patient appears to be agreeable to the plan. CSW will continue to follow for support and encouragement with plan for recovery. [name], [place], [place]

## 2020-04-24 NOTE — Progress Notes (Signed)
Inpatient Rehabilitation-Admissions Coordinator   Note pt may need [place] conversation. Will follow along to see if CIR aligns with pt/family goals.   Raechel Ache, OTR/L  Rehab Admissions Coordinator  [phone] 04/24/2020 1:42 PM

## 2020-04-24 NOTE — Progress Notes (Signed)
ANTICOAGULATION CONSULT NOTE  Pharmacy Consult for warfarin Indication: post op LVAD  / Aflutter  Allergies  Allergen Reactions  . Liraglutide Nausea And Vomiting  . Lisinopril Cough    Patient Measurements: Height: 6' (182.9 cm) Weight: 98.3 kg (216 lb 11.4 oz) IBW/kg (Calculated) : 77.6 Heparin Dosing Weight: 101.2 kg  Vital Signs: Temp: 97.7 F (36.5 C) (05/13 0716) Temp Source: Axillary (05/13 0716) BP: 111/99 (05/13 0716) Pulse Rate: 80 (05/13 0716)  Labs: Recent Labs    04/22/20 0254 04/22/20 0254 04/23/20 0326 04/23/20 1155 04/24/20 0831  HGB 7.6*   < >  --  8.2* 8.4*  HCT 24.0*  --   --  25.9* 27.1*  PLT 292  --   --  315 316  LABPROT 25.3*  --  26.8*  --  29.9*  INR 2.4*  --  2.6*  --  3.0*  CREATININE 8.32*  --  5.59*  --  4.17*   < > = values in this interval not displayed.    Estimated Creatinine Clearance: 20 mL/min (A) (by C-G formula based on SCr of 4.17 mg/dL (H)).   Assessment: 19 YOM with atrial fibrillation on Eliquis PTA s/p R & L heart cath found to have 3v disease s/p  LVAD  Implant 4/8. Warfarin initiated on 4/9 after discussing with MD.  CT on 4/15 - found to have large right MCA stroke. INR goal reduced 5/4.  INR up to 3.0, likely due to poor diet.   Goal of Therapy:  INR 1.8-2.3 Monitor platelets by anticoagulation protocol: Yes   Plan:  -Hold warfarin again tonight -Daily PT/INR   Arrie Senate, PharmD, BCPS Clinical Pharmacist [phone] Please check AMION for all [facility] Pharmacy numbers 04/24/2020

## 2020-04-24 NOTE — Progress Notes (Addendum)
LVAD Coordinator Rounding Note:  HM III LVAD implanted on 03/27/2020 with MAZE procedure + LAA clipping  by Dr Orvan Seen under destination therapy criteria due to advanced age. Primary Heart Failure Cardiologist is Dr. Haroldine Laws.   Pt lying in bed, CNA shaving him.   Pt wants to be "left alone" and wishes to speak with his family. BS nurse reports he refused am meds, refuses to put forth any effort with PT when getting OOB. Dr. Haroldine Laws and Darrick Grinder, NP updated.    Vital signs: Temp: 97.7 HR: 80 - afib Cuff:  Doppler:   O2 Sat: 100% on RA Wt: 253.3>262.3>260.8>259.9>237.8.Marland Kitchen..217.5>224.8>218.7>221.7>216.7 lbs   LVAD interrogation reveals:  Speed: 5300 Flow: 3.5 Power: 3.8w  PI: 7.7  Alarms: none Events: none Hematocrit: 24  Fixed speed: 5300 Low speed limit: 5000   Drive Line:CDI. Anchor secure. Dressing changes twice weekly (Monday / Thursday) dressing changes per nurse champion or VAD coordinator.  Next dressing change due 04/28/20.   Labs:  LDH trend: 360>513>482>...241>253>224>225>215>222>238  INR trend: 1.3>6.2>9>4.4>6.2>2.2.Marland Kitchen..2.1>2.4>2.1>2.2>2.4>2.6>3.0  CR: 3.57>3.98>3.18>2.44...1.83>1.88>2.3>5.52>4.54>6.3>4.34>6.4>7.14>8.32>5.59>4.17   Anticoagulation Plan: -INR Goal: 1.8 - 2.3  -ASA Dose: 81 mg - will continue due to hx of CVA per Dr. Haroldine Laws  Respiratory: - extubated 4/9/2 - re-intubated 03/23/2020 s/p left hemothorax with cardiac arrest - extubated 04/06/20  Renal:  - CVVH started 03/25/20 - Intermittent HD dialysis started 04/14/20 - 04/16/20 Tunneled HD cath placed  Neuro: - Right MCA CVA on 03/27/20  Nitric Oxide: off 03/23/20  Blood Products:  Intraop: 6 FFP  DDAVP  420 cell saver  04/02/2020>>4 FFP 03/21/20>> 1 RBC 03/22/20>>1 RBC  Intra-op: 03/25/2020>>2 PCs      2 FFP Post-op: 04/10/2020>>3 PC's, 2 FFP 04/14/20>>1 PC's 04/15/20>> 1 PC's 04/18/20>>1 PC's  Cultures:  -03/25/20- blood cultures>>negative - 03/24/2020-blood cultures>>negative - 04/03/20 -  respiratory culture>>neg   Device: -St [name] -Therapies: VF 194 - therapy on  Drips: - Milrinone 0.125 mcg/kg/min >> off 04/09/20 - Levo 1 mcg/min>> off 04/10/20 - Amiodarone 30 mg/hr-off - Tube feed: pt refusing  Adverse Events on VAD: >>03/25/20 CVVH Started >>03/27/2020 left hemothorax; cardiac arrest >>04/14/20 HD  Pt Education:  1. No VAD education with patient this am; he is refusing to engage and just wants "to be left alone". Will contact caregiver and ask her to come in today for patient/family conference.  2. No family at bedside.   Plan/Recommendations:  1. Call VAD coordinator for any equipment or drive line issues.  2. Dressing changes Monday / Thursday per nurse champion or VAD coordinator.   Zada Girt RN [place] Coordinator  Office: [phone]  24/7 Pager: [phone]

## 2020-04-25 LAB — CBC
HCT: 24.8 % — ABNORMAL LOW (ref 39.0–52.0)
Hemoglobin: 7.6 g/dL — ABNORMAL LOW (ref 13.0–17.0)
MCH: 27 pg (ref 26.0–34.0)
MCHC: 30.6 g/dL (ref 30.0–36.0)
MCV: 87.9 fL (ref 80.0–100.0)
Platelets: 281 10*3/uL (ref 150–400)
RBC: 2.82 MIL/uL — ABNORMAL LOW (ref 4.22–5.81)
RDW: 15.3 % (ref 11.5–15.5)
WBC: 8.9 10*3/uL (ref 4.0–10.5)
nRBC: 0 % (ref 0.0–0.2)

## 2020-04-25 LAB — GLUCOSE, CAPILLARY
Glucose-Capillary: 145 mg/dL — ABNORMAL HIGH (ref 70–99)
Glucose-Capillary: 154 mg/dL — ABNORMAL HIGH (ref 70–99)
Glucose-Capillary: 178 mg/dL — ABNORMAL HIGH (ref 70–99)
Glucose-Capillary: 206 mg/dL — ABNORMAL HIGH (ref 70–99)
Glucose-Capillary: 206 mg/dL — ABNORMAL HIGH (ref 70–99)
Glucose-Capillary: 225 mg/dL — ABNORMAL HIGH (ref 70–99)
Glucose-Capillary: 238 mg/dL — ABNORMAL HIGH (ref 70–99)

## 2020-04-25 LAB — RENAL FUNCTION PANEL
Albumin: 2.3 g/dL — ABNORMAL LOW (ref 3.5–5.0)
Anion gap: 11 (ref 5–15)
BUN: 67 mg/dL — ABNORMAL HIGH (ref 8–23)
CO2: 26 mmol/L (ref 22–32)
Calcium: 8.4 mg/dL — ABNORMAL LOW (ref 8.9–10.3)
Chloride: 96 mmol/L — ABNORMAL LOW (ref 98–111)
Creatinine, Ser: 5.52 mg/dL — ABNORMAL HIGH (ref 0.61–1.24)
GFR calc Af Amer: 11 mL/min — ABNORMAL LOW (ref >60)
GFR calc non Af Amer: 10 mL/min — ABNORMAL LOW (ref >60)
Glucose, Bld: 160 mg/dL — ABNORMAL HIGH (ref 70–99)
Phosphorus: 6.1 mg/dL — ABNORMAL HIGH (ref 2.5–4.6)
Potassium: 3.7 mmol/L (ref 3.5–5.1)
Sodium: 133 mmol/L — ABNORMAL LOW (ref 135–145)

## 2020-04-25 LAB — PROTIME-INR
INR: 1 (ref 0.8–1.2)
INR: 2.3 — ABNORMAL HIGH (ref 0.8–1.2)
Prothrombin Time: 13.2 seconds (ref 11.4–15.2)
Prothrombin Time: 24.9 seconds — ABNORMAL HIGH (ref 11.4–15.2)

## 2020-04-25 LAB — LACTATE DEHYDROGENASE: LDH: 231 U/L — ABNORMAL HIGH (ref 98–192)

## 2020-04-25 LAB — MAGNESIUM: Magnesium: 2.3 mg/dL (ref 1.7–2.4)

## 2020-04-25 MED ORDER — WARFARIN 0.5 MG HALF TABLET
0.5000 mg | ORAL_TABLET | Freq: Once | ORAL | Status: AC
  Administered 2020-04-25: 0.5 mg via ORAL
  Filled 2020-04-25: qty 1

## 2020-04-25 MED ORDER — HEPARIN SODIUM (PORCINE) 1000 UNIT/ML IJ SOLN
INTRAMUSCULAR | Status: AC
  Filled 2020-04-25: qty 3

## 2020-04-25 NOTE — Progress Notes (Signed)
PT Cancellation Note  Patient Details Name: [name] [name] MRN: [id number] DOB: Jul 11, 1951   Cancelled Treatment:    Reason Eval/Treat Not Completed: Patient at procedure or test/unavailable(HD)   Faatima Tench B Shantaya Bluestone 04/25/2020, 10:45 AM Bayard Males, PT Acute Rehabilitation Services Pager: [phone] Office: [phone]

## 2020-04-25 NOTE — Progress Notes (Addendum)
Patient ID: [name] [name], male   DOB: 06/28/1951, 69 y.o.   MRN: [id number]    S: No acute events overnight. BP slightly soft this morning but overall stable. Plan for HD today. Patient minimally conversant and states he is fatigued but otherwise does not express specific concerns.   O:BP 99/82 (BP Location: Left Arm)   Pulse 73   Temp 97.7 F (36.5 C) (Axillary)   Resp 11   Ht 6' (1.829 m)   Wt 98 kg   SpO2 98%   BMI 29.30 kg/m   Intake/Output Summary (Last 24 hours) at 04/25/2020 0918 Last data filed at 04/24/2020 2112 Gross per 24 hour  Intake 436.17 ml  Output --  Net 436.17 ml   Intake/Output: I/O last 3 completed shifts: In: 496.2 [P.O.:260; I.V.:50; NG/GT:186.2] Out: -   Intake/Output this shift:  No intake/output data recorded. Weight change: -3.4 kg Gen: resting in bed in NAD CVS: mechanical hum Resp: cta, bl chest rise Abd: +BS, soft, nt/nd Ext: trace edema in ble, warm extremeties  Recent Labs  Lab 04/19/20 0239 04/20/20 0253 04/21/20 0237 04/22/20 0254 04/23/20 0326 04/24/20 0831 04/25/20 0230  NA 135 135 134* 132* 134* 133* 133*  K 3.9 3.8 4.3 4.9 4.5 3.7 3.7  CL 94* 94* 94* 92* 94* 93* 96*  CO2 26 26 25 25 26 27 26   GLUCOSE 75 187* 149* 143* 109* 77 160*  BUN 53* 82* 112* 141* 73* 39* 67*  CREATININE 4.37* 5.80* 7.14* 8.32* 5.59* 4.17* 5.52*  ALBUMIN 2.3* 2.3* 2.3* 2.3* 2.2* 2.4* 2.3*  CALCIUM 8.2* 8.5* 8.6* 8.6* 8.7* 8.5* 8.4*  PHOS 5.1* 5.2* 4.8* 6.3* 5.4* 4.5 6.1*   Liver Function Tests: Recent Labs  Lab 04/23/20 0326 04/24/20 0831 04/25/20 0230  ALBUMIN 2.2* 2.4* 2.3*   No results for input(s): LIPASE, AMYLASE in the last 168 hours. No results for input(s): AMMONIA in the last 168 hours. CBC: Recent Labs  Lab 04/21/20 0237 04/21/20 0237 04/22/20 0254 04/22/20 0254 04/23/20 1155 04/24/20 0831 04/25/20 0230  WBC 9.3   < > 8.1   < > 7.9 7.5 8.9  HGB 8.1*   < > 7.6*   < > 8.2* 8.4* 7.6*  HCT 25.6*   < > 24.0*   < > 25.9*  27.1* 24.8*  MCV 86.8  --  86.6  --  87.5 88.3 87.9  PLT 300   < > 292   < > 315 316 281   < > = values in this interval not displayed.   Cardiac Enzymes: No results for input(s): CKTOTAL, CKMB, CKMBINDEX, TROPONINI in the last 168 hours. CBG: Recent Labs  Lab 04/24/20 1556 04/24/20 1958 04/25/20 0051 04/25/20 0343 04/25/20 0711  GLUCAP 170* 178* 154* 206* 145*    Iron Studies: No results for input(s): IRON, TIBC, TRANSFERRIN, FERRITIN in the last 72 hours. Studies/Results: No results found. . sodium chloride   Intravenous Once  . aspirin  81 mg Per Tube Daily  . atorvastatin  80 mg Per Tube q1800  . bisacodyl  10 mg Oral Daily   Or  . bisacodyl  10 mg Rectal Daily  . carvedilol  3.125 mg Oral BID WC  . Chlorhexidine Gluconate Cloth  6 each Topical Daily  . Chlorhexidine Gluconate Cloth  6 each Topical Q0600  . Chlorhexidine Gluconate Cloth  6 each Topical Q0600  . citalopram  10 mg Per Tube Daily  . darbepoetin (ARANESP) injection - NON-DIALYSIS  100 mcg Subcutaneous  Q Tue-1800  . feeding supplement (NEPRO CARB STEADY)  237 mL Oral BID BM  . feeding supplement (PRO-STAT SUGAR FREE 64)  30 mL Per Tube BID  . influenza vaccine adjuvanted  0.5 mL Intramuscular Tomorrow-1000  . insulin aspart  0-24 Units Subcutaneous Q4H  . insulin aspart  6 Units Subcutaneous Q4H  . insulin glargine  24 Units Subcutaneous BID  . multivitamin  1 tablet Oral QHS  . pantoprazole (PROTONIX) IV  40 mg Intravenous Daily  . sodium chloride flush  10-40 mL Intracatheter Q12H  . Warfarin - Pharmacist Dosing Inpatient   Does not apply q1600    BMET    Component Value Date/Time   NA 133 (L) 04/25/2020 0230   NA 132 (L) 03/07/2020 1501   K 3.7 04/25/2020 0230   CL 96 (L) 04/25/2020 0230   CO2 26 04/25/2020 0230   GLUCOSE 160 (H) 04/25/2020 0230   BUN 67 (H) 04/25/2020 0230   BUN 16 03/07/2020 1501   CREATININE 5.52 (H) 04/25/2020 0230   CREATININE 1.29 (H) 12/10/2016 1011   CALCIUM 8.4 (L)  04/25/2020 0230   GFRNONAA 10 (L) 04/25/2020 0230   GFRNONAA 57 (L) 01/28/2015 1558   GFRAA 11 (L) 04/25/2020 0230   GFRAA 66 01/28/2015 1558   CBC    Component Value Date/Time   WBC 8.9 04/25/2020 0230   RBC 2.82 (L) 04/25/2020 0230   HGB 7.6 (L) 04/25/2020 0230   HGB 15.8 04/24/2018 1148   HCT 24.8 (L) 04/25/2020 0230   HCT 45.9 04/24/2018 1148   PLT 281 04/25/2020 0230   PLT 281 04/24/2018 1148   MCV 87.9 04/25/2020 0230   MCV 89 04/24/2018 1148   MCH 27.0 04/25/2020 0230   MCHC 30.6 04/25/2020 0230   RDW 15.3 04/25/2020 0230   RDW 14.2 04/24/2018 1148   LYMPHSABS 0.4 (L) 04/02/2020 0429   LYMPHSABS 1.1 04/24/2018 1148   MONOABS 2.2 (H) 04/02/2020 0429   EOSABS 0.0 04/02/2020 0429   EOSABS 0.2 04/24/2018 1148   BASOSABS 0.1 04/02/2020 0429   BASOSABS 0.1 04/24/2018 1148   Assessment/Plan:  1. AKI on CKD: BL Crt 1.5 on 03/07/20. AKI in setting of cardiogenic shock, pressors, s/p LVAD 04/08/2020. CRRT initiated on 03/25/20 and stopped 04/12/20.Transitioned to intermittent HD 04/14/20.  RRT dependent for >30 days unlikely to recover 1. Plan for HD  MWF-  Poor OP candidate due to somnolence, minimal cooperation, possible inability to sit up. Not participating in therapy. 2. No heparin with HD on systemic anticoagulation  2. Vascular access- RIJ TDC placed 04/16/20 by IR. 1. Obtain AVF/AVG when he is more stable/ready for discharge. Given issues as above may not be an outpt candidate unless he has significant progression.  3. sCHF s/p LVAD 03/29/2020 - cardiology managing. UF w/ HD 4. A fib s/p MAZE and LAA clipping 4/8 5. R MCA ischemic stroke- large, seen on CT on 03/27/20. Likely contributing to poor mental state 6. Left hemothorax s/p VATS 04/03/2020. 7. Anemia of CKD, multifactorial-s/p transfusion 04/14/20 and 5/7.Continue to follow, transfuse PRN.  and continue ESA. Iron studies likely not accurate given recent transfusion 8. Hyperphosphatemia (BMD): Phos 6.1 today. Minimal PO  intake. Will F/u PTH and consider treatment and/or phos binder. Calcium acceptable (corrects to normal accounting for albumin).    Reesa Chew  [employer] [phone]

## 2020-04-25 NOTE — Progress Notes (Signed)
Patient ID: [name] [name], male   DOB: Apr 22, 1951, 69 y.o.   MRN: [id number]    Advanced Heart Failure Rounding Note   Subjective:    - S/p HM3 VAD 4/8 w MAZE procedure + LAA clipping. - Extubated 4/9 - 4/12 LVAD speed increased to 5400 --> echo moderate-severe RV dysfunction, septum mildly left shifted, trivial pericardial effusion.  LVAD speed cut back to 5300.  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  - 4/20 developed hemothorax requiring emergent CT placement and eventual VATS - 4/21 EEG done given concerns for posturing and c/w moderate to severe diffuse encephalopathy. No seizures or epileptiform discharges were seen  - 04/03/20 Pump Stop --> controller change out. CT of head unchanged R MCA CVA - 04/06/20 Extubated - 5/1 CRRT stopped. Had first iHD 5/3.  - 5/4 1uRBC - 5/6 1u RBC  Long talk yesterday with patient about need to participate with rehab and providers. This am patient refusing to talk to VAD coordinator and did not participate in teaching. Had HD this morning and was uneventful.   This afternoon his stepdaughter is here and he is more interactive. He participated with PT. Denies SOB, orthopnea or PND. He asked me "Am I dying?"    VAD Interrogation  Flow  Speed 5300, Power 4.2  Pulse Index 3.9 VAD interrogated personally. Parameters stable.   Objective:   Weight Range:  Vital Signs:   Temp:  [97 F (36.1 C)-98.5 F (36.9 C)] 97 F (36.1 C) (05/14 1359) Pulse Rate:  [59-94] 59 (05/14 1359) Resp:  [11-20] 17 (05/14 1359) BP: (84-119)/(46-95) 119/95 (05/14 1446) SpO2:  [96 %-98 %] 97 % (05/14 1359) Weight:  [98 kg-100 kg] 99.5 kg (05/14 1359) Last BM Date: 04/21/20  Weight change: Filed Weights   04/25/20 0500 04/25/20 0934 04/25/20 1359  Weight: 98 kg 100 kg 99.5 kg    Intake/Output:   Intake/Output Summary (Last 24 hours) at 04/25/2020 1458 Last data filed at 04/25/2020 1359 Gross per 24 hour  Intake 436.17 ml  Output 1501 ml   Net -1064.83 ml    Maps 80s     Physical Exam: General:  Elderly male sitting in chair. NAD. HEENT: normal  Neck: supple. JVP not elevated.  Carotids 2+ bilat; no bruits. No lymphadenopathy or thryomegaly appreciated. Cor: LVAD hum.  + tunneled cath Lungs: Clear. Abdomen: soft, nontender, non-distended. No hepatosplenomegaly. No bruits or masses. Good bowel sounds. Driveline site clean. Anchor in place.  Extremities: no cyanosis, clubbing, rash. Warm no edema  Neuro: alert & oriented x 3. No focal deficits. Moves all 4 without problem    Labs: Basic Metabolic Panel: Recent Labs  Lab 04/21/20 0237 04/21/20 0237 04/22/20 0254 04/22/20 0254 04/23/20 0326 04/24/20 0831 04/25/20 0230  NA 134*  --  132*  --  134* 133* 133*  K 4.3  --  4.9  --  4.5 3.7 3.7  CL 94*  --  92*  --  94* 93* 96*  CO2 25  --  25  --  26 27 26   GLUCOSE 149*  --  143*  --  109* 77 160*  BUN 112*  --  141*  --  73* 39* 67*  CREATININE 7.14*  --  8.32*  --  5.59* 4.17* 5.52*  CALCIUM 8.6*   < > 8.6*   < > 8.7* 8.5* 8.4*  MG 2.9*  --  3.1*  --  2.6* 2.2 2.3  PHOS 4.8*  --  6.3*  --  5.4* 4.5 6.1*   < > = values in this interval not displayed.    Liver Function Tests: Recent Labs  Lab 04/21/20 0237 04/22/20 0254 04/23/20 0326 04/24/20 0831 04/25/20 0230  ALBUMIN 2.3* 2.3* 2.2* 2.4* 2.3*   No results for input(s): LIPASE, AMYLASE in the last 168 hours. No results for input(s): AMMONIA in the last 168 hours.  CBC: Recent Labs  Lab 04/21/20 0237 04/22/20 0254 04/23/20 1155 04/24/20 0831 04/25/20 0230  WBC 9.3 8.1 7.9 7.5 8.9  HGB 8.1* 7.6* 8.2* 8.4* 7.6*  HCT 25.6* 24.0* 25.9* 27.1* 24.8*  MCV 86.8 86.6 87.5 88.3 87.9  PLT 300 292 315 316 281    Cardiac Enzymes: No results for input(s): CKTOTAL, CKMB, CKMBINDEX, TROPONINI in the last 168 hours.  BNP: BNP (last 3 results) Recent Labs    04/02/20 2342 04/10/20 0132 04/17/20 0043  BNP 332.7* 267.2* 582.1*    ProBNP (last 3  results) No results for input(s): PROBNP in the last 8760 hours.    Other results:  Imaging: No results found.   Medications:     Scheduled Medications: . sodium chloride   Intravenous Once  . aspirin  81 mg Per Tube Daily  . atorvastatin  80 mg Per Tube q1800  . bisacodyl  10 mg Oral Daily   Or  . bisacodyl  10 mg Rectal Daily  . carvedilol  3.125 mg Oral BID WC  . Chlorhexidine Gluconate Cloth  6 each Topical Daily  . Chlorhexidine Gluconate Cloth  6 each Topical Q0600  . Chlorhexidine Gluconate Cloth  6 each Topical Q0600  . citalopram  10 mg Per Tube Daily  . darbepoetin (ARANESP) injection - NON-DIALYSIS  100 mcg Subcutaneous Q Tue-1800  . feeding supplement (NEPRO CARB STEADY)  237 mL Oral BID BM  . feeding supplement (PRO-STAT SUGAR FREE 64)  30 mL Per Tube BID  . heparin sodium (porcine)      . influenza vaccine adjuvanted  0.5 mL Intramuscular Tomorrow-1000  . insulin aspart  0-24 Units Subcutaneous Q4H  . insulin aspart  6 Units Subcutaneous Q4H  . insulin glargine  24 Units Subcutaneous BID  . multivitamin  1 tablet Oral QHS  . pantoprazole (PROTONIX) IV  40 mg Intravenous Daily  . sodium chloride flush  10-40 mL Intracatheter Q12H  . warfarin  0.5 mg Oral ONCE-1600  . Warfarin - Pharmacist Dosing Inpatient   Does not apply q1600    Infusions: . sodium chloride Stopped (04/15/20 1731)  . feeding supplement (NEPRO CARB STEADY) 50 mL/hr at 04/25/20 0901    PRN Medications: sodium chloride, acetaminophen, dextrose, heparin sodium (porcine), hydrALAZINE, levalbuterol, ondansetron (ZOFRAN) IV, oxyCODONE, promethazine, sodium chloride flush, sorbitol, traMADol   Assessment/Plan:   1. Acute on chronic systolic HF -> cardiogenic shock-> S/p HM3 LVAD - Echo 2016 EF 30-35% - Echo 1/21 EF 25% - Admitted with NYHA IV symptoms and AKI with attempts at diuresis. Initial co-ox 39% - Echo this admit: EF 10-15% moderate RV dysfunction - R/LHC cath 3/21 with severe  3v CAD and low output with CI 1.7.   - S/p HM3 VAD 4/8  - Continue current dose of carvedilol 3.125 mg twice a day.   - Volume status managed by HD - he had HD this am and looks good   2. VAD - VAD interrogated personally. Parameters stable. - Had Pump Stop 04/03/20. Controller changed out with old controller sent back to [facility] for evaluation.  -  on aspirin 81 mg daily + coumadin.  - LDH 231 . We have lowered INR goal to 1.8-2.3 given anemia. - INR today 2.3 Discussed dosing with PharmD personally.  3. CAD with unstable angina - s/p previous PCI. - cath 02/13/2020 with severe 3v CAD - now s/p VAD on 03/23/2020   - No s/s ischemia  4. AKI on CKD 3a -> ESRD - Nephrology consulted. Started CVVHD 4/13.    - Renal US 4/5 unremarkable.  - Hopefully renal function will recover but Renal team not optimistic - He is now off NE w/ stable MAP.  - Off CVVH.  1st iHD 5/3.  -  Tunnel HD cath placed 5/5.  - Tolerating iHD. Will need permanent access at some point but given multiple issues including patient's lack of cooperation with care we are holding off  5. Permanent AF - now s/p MAZE + LAA Clipping 4/8   -  Continue Carvedilol 3.125 mg twice a day  - On coumadin. INR 2.3 .  6. MVP s/p MV repair - On admit with recurrent severe MR on echo and with huge v-waves on PCWP tracing - s/p VAD  7. Hemothoax - s/p CT placement and VATs on 4/22 - CTs pulled 4/26 -Resolved   8. Severe Protein Malnutrition  - Albumin 2.4 - Needs to resume tube feeds. Discussed with him the importance.   - Speech following--> progressing diet. Still has cor-trak in to meet calorie needs  9. CVA/anoxic brain injury - 03/27/20 CT large posterior MCA ischemic infarct, possibly from atrial fibrillation.  INR was supratherapeutic when CVA found.  Stable LDH, do not think partial pump thrombosis is the culprit. - Still with some L weakness and inattention but improving  - PT/OT/speech following.  - CIR consulted.    10. DM2, poorly controlled - hgbA1c 11.1% - continue insulin   11. Agitation/ Depression  - Celexa 10 mg daily added.  12. Deconditioning - Continue PT/OT . Discussed the importance of working with therapy.   -CIR recommended but they have been following from distance given issues with non-cooperation    13. Anemia, post-op blood loss - hgb 7.6. no obvious bleeding - transfuse hgb < 7.5  14. Compliance - continues to be a major issue. I am not sure if his lability is related to his CVA or this is baseline. His stepdaughter is very involved and try to help.  - I again stressed today need to participate in VAD training and rehab.   Hopefully we can get him to CIR next week.    Length of Stay: [place], MD  2:58 PM  04/25/2020

## 2020-04-25 NOTE — Progress Notes (Addendum)
Inpatient Rehabilitation-Admissions Coordinator   Noted increased participation with PT session today with step-daughter present. Hoping pt continues with this trend and the support from family will help with his compliance. AC will follow up with pt and his family next week if pt continues to be an active participant in his rehab.   Raechel Ache, OTR/L  Rehab Admissions Coordinator  [phone] 04/25/2020 4:16 PM

## 2020-04-25 NOTE — Progress Notes (Signed)
  Speech Language Pathology Treatment: Cognitive-Linquistic  Patient Details Name: [name] [name] MRN: [id number] DOB: [birth year] Today's Date: 04/25/2020 Time: 0947-0962 SLP Time Calculation (min) (ACUTE ONLY): 12 min  Assessment / Plan / Recommendation Clinical Impression  Pt was seen for treatment. He was seated upright in chair and verbal output was limited despite encouragement. He refused all p.o. intake during the session including the chocolate milk about which he was previously excited. He was initially disoriented to place but was able to provide the accurate place during a simple reasoning activity. He required verbal cues to attend to tasks and consistent verbal prompts were needed for problem solving related to safely. Pt's level of alertness waned as the session progressed and it was ultimately terminated for this reason. SLP will continue to follow pt.    HPI HPI: Mr. Dorvil is a 69 year old gentleman with a history of ischemic cardiomyopathy who presented for an LVAD implantation and left atrial appendage clipping with sternal reconstruction on 4/8, extubated 4/9. Started CRRT on 8/36  He had a complicated postop course including a right MCA stroke on 4/15. Evaluated by SLP 4/17, started on a diet.  He had a cardiac arrest on 4/20 requiring reintubation until 4/25. BSE repeated 4/26, with recommendation for NPO. His mental status has been improving slowly for several days.      SLP Plan  Continue with current plan of care       Recommendations                   Follow up Recommendations: Inpatient Rehab SLP Visit Diagnosis: Cognitive communication deficit (O29.476) Plan: Continue with current plan of care       Sheyna Pettibone I. Hardin Negus, [place], [place] Office number [phone] Pager [phone]                Horton Marshall 04/25/2020, 5:44 PM

## 2020-04-25 NOTE — Progress Notes (Signed)
SLP Cancellation Note  Patient Details Name: [name] [name] MRN: [id number] DOB: Apr 13, 1951   Cancelled treatment:       Reason Eval/Treat Not Completed: Patient at procedure or test/unavailable(Pt off unit for HD. SLP will follow up.)  Kleigh Hoelzer I. Hardin Negus, [place], [place] Office number [phone] Pager [place] 04/25/2020, 11:09 AM

## 2020-04-25 NOTE — Progress Notes (Signed)
Occupational Therapy Treatment Patient Details Name: [name] [name] MRN: [id number] DOB: 1951-04-29 Today's Date: 04/25/2020    History of present illness 69 yo male with history of CAD with prior stenting of the LAD, Diagonal and OM in an outside hospital, ischemic cardiomyopathy with ICD in place, chronic systolic CHF, MVP s/p mitral valve repair, permanent atrial fib on chronic anticoagulation, HTN, HLD, DM, depression admitted to [facility] with progressive weakness, fatigue, dyspnea and chest pressure. Troponin elevated at 0.06. He was transferred to [facility] for cardiac cath, showing severe 3 vessel disease. Plan for CABG but pt with AKI. Pt underwent multiple tooth extraction on 4/6 and LVAD placement on 4/8. Postop day 7 patient was noted to be agitated the CT head was obtained showing a large posterior right MCA infarct. Pt with arrest on 4/20 and reintubated, extubated 4/25. Pt on CRRT until 04/12/20 when transitioned to HD.Tunnelled HD cath 5/5   OT comments  Pt seen after HD, somewhat lethargic, but participating with encouragement of step-daughter [name]. Assisted pt to EOB, to stand and transfer to chair with stedy and 2 person assist. Pt able to drink from straw with min to set up assist. Progressing slowly.  Follow Up Recommendations  CIR;Supervision/Assistance - 24 hour    Equipment Recommendations  None recommended by OT    Recommendations for Other Services      Precautions / Restrictions Precautions Precautions: Fall;Sternal Precaution Booklet Issued: No Precaution Comments: pt with no carry over of precautions; cortrak Restrictions Weight Bearing Restrictions: Yes Other Position/Activity Restrictions: sternal precautions       Mobility Bed Mobility Overal bed mobility: Needs Assistance Bed Mobility: Supine to Sit     Supine to sit: +2 for physical assistance;Max assist     General bed mobility comments: Pt minimally moving LEs; assist with trunk and  scooting bottom to get to EOB.  Transfers Overall transfer level: Needs assistance Equipment used: Ambulation equipment used Transfers: Sit to/from Stand Sit to Stand: Mod assist;+2 physical assistance;From elevated surface         General transfer comment: modA of 2 to power up from bed, attempted to stand from lower bed height and pt not able to clear bottom despite Max A of 2; minAx2 from stedy seat. Cues to keep hands onknees. transferred to chair via stedy.    Balance Overall balance assessment: Needs assistance Sitting-balance support: Feet supported;No upper extremity supported Sitting balance-Leahy Scale: Poor Sitting balance - Comments: initially with significant posterior lean, but as he sat, was able to demonstrate fair balance Postural control: Posterior lean Standing balance support: During functional activity Standing balance-Leahy Scale: Poor Standing balance comment: stood in stedy with +2 min assist, tolerates only for about 10 seconds or less                           ADL either performed or assessed with clinical judgement   ADL Overall ADL's : Needs assistance/impaired Eating/Feeding: Minimal assistance;Sitting Eating/Feeding Details (indicate cue type and reason): drinking chocolate milk with straw, able to reach for and return cup upright to table                                 Functional mobility during ADLs: +2 for safety/equipment;Moderate assistance(with stedy)       Vision       Perception     Praxis  Cognition Arousal/Alertness: Lethargic(eyes closed much of session) Behavior During Therapy: Flat affect Overall Cognitive Status: Impaired/Different from baseline Area of Impairment: Orientation;Attention;Memory;Following commands;Safety/judgement;Awareness                 Orientation Level: Disoriented to;Place(initially) Current Attention Level: Focused Memory: Decreased short-term memory;Decreased  recall of precautions Following Commands: Follows one step commands inconsistently;Follows one step commands with increased time Safety/Judgement: Decreased awareness of deficits Awareness: Intellectual Problem Solving: Slow processing;Decreased initiation;Difficulty sequencing;Requires verbal cues General Comments: Oriented to month/year, situation and initially not place, "we are at some apts," but with cues able to state [facility]. Became more alert and responsive when step daughter  present. Excited about chocolate milk. Poor motor planning/initiation.        Exercises    Shoulder Instructions       General Comments VSS; step daughter, Ander Purpura, present.    Pertinent Vitals/ Pain       Pain Assessment: Faces Faces Pain Scale: Hurts little more Pain Location: generalized with movement Pain Descriptors / Indicators: (yells out when standing) Pain Intervention(s): Monitored during session;Repositioned  Home Living                                          Prior Functioning/Environment              Frequency  Min 2X/week        Progress Toward Goals  OT Goals(current goals can now be found in the care plan section)  Progress towards OT goals: Progressing toward goals  Acute Rehab OT Goals Patient Stated Goal: to drink chocolate milk OT Goal Formulation: With patient Time For Goal Achievement: 05/07/20 Potential to Achieve Goals: [place] Discharge plan remains appropriate    Co-evaluation    PT/OT/SLP Co-Evaluation/Treatment: Yes Reason for Co-Treatment: Complexity of the patient's impairments (multi-system involvement) PT goals addressed during session: Mobility/safety with mobility OT goals addressed during session: ADL's and self-care;Strengthening/ROM      AM-PAC OT "6 Clicks" Daily Activity     Outcome Measure   Help from another person eating meals?: A Little Help from another person taking care of personal grooming?:  Total Help from another person toileting, which includes using toliet, bedpan, or urinal?: Total Help from another person bathing (including washing, rinsing, drying)?: Total Help from another person to put on and taking off regular upper body clothing?: Total Help from another person to put on and taking off regular lower body clothing?: Total 6 Click Score: 8    End of Session Equipment Utilized During Treatment: Gait belt  OT Visit Diagnosis: Cognitive communication deficit (R41.841);Unsteadiness on feet (R26.81);Muscle weakness (generalized) (M62.81);Pain Symptoms and signs involving cognitive functions: Cerebral infarction   Activity Tolerance Patient limited by lethargy   Patient Left in chair;with call bell/phone within reach;with chair alarm set;with family/visitor present   Nurse Communication          Time: 1601-0932 OT Time Calculation (min): 32 min  Charges: OT General Charges $OT Visit: 1 Visit OT Treatments $Therapeutic Activity: 8-22 mins  Nestor Lewandowsky, OTR/L Acute Rehabilitation Services Pager: [phone] Office: [phone]   [name] 04/25/2020, 4:24 PM

## 2020-04-25 NOTE — Progress Notes (Addendum)
Pt transported to HD with RN, black bag with backup controller and backup batteries left with patient.

## 2020-04-25 NOTE — Progress Notes (Signed)
LVAD Coordinator Rounding Note:  HM III LVAD implanted on 03/15/2020 with MAZE procedure + LAA clipping  by Dr Orvan Seen under destination therapy criteria due to advanced age. Primary Heart Failure Cardiologist is Dr. Haroldine Laws.   Saw pt in HD this morning. Pt refuses to speak with VAD coordinator this morning. Grunts in response to my questions. When asked if he was in pain he said "no" but otherwise will not interact with me.   Attempted to review last 6 alarms on patient controller. Screen is blank and freezes on last 6 alarm screen.   Vital signs: Temp: 97.6 HR: 80 - afib Cuff: 99/82 (89) Doppler: 102  O2 Sat: 100% on RA Wt: 253.3>262.3>260.8>259.9>237.8.Marland Kitchen..217.5>224.8>218.7>221.7>216.7>220.4 lbs   LVAD interrogation reveals:  Speed: 5300 Flow: 4.2 Power: 3.9w  PI: 3.7  Alarms: none Events: none Hematocrit: 24  Fixed speed: 5300 Low speed limit: 5000   Drive Line: CDI. Anchor secure. Dressing changes twice weekly (Monday / Thursday) dressing changes per nurse champion or VAD coordinator.  Next dressing change due 04/28/20.   Labs:  LDH trend: 360>513>482>...241>253>224>225>215>222>238>231  INR trend: 1.3>6.2>9>4.4>6.2>2.2.Marland Kitchen..2.1>2.4>2.1>2.2>2.4>2.6>3.0>1.0  CR: 3.57>3.98>3.18>2.44...1.83>1.88>2.3>5.52>4.54>6.3>4.34>6.4>7.14>8.32>5.59>4.17>5.52   Anticoagulation Plan: -INR Goal: 1.8 - 2.3  -ASA Dose: 81 mg - will continue due to hx of CVA per Dr. Haroldine Laws  Respiratory: - extubated 4/9/2 - re-intubated 03/29/2020 s/p left hemothorax with cardiac arrest - extubated 04/06/20  Renal:  - CVVH started 03/25/20 - Intermittent HD dialysis started 04/14/20 - 04/16/20 Tunneled HD cath placed  Neuro: - Right MCA CVA on 03/27/20  Nitric Oxide: off 03/23/20  Blood Products:  Intraop: 6 FFP  DDAVP  420 cell saver  03/29/2020>>4 FFP 03/21/20>> 1 RBC 03/22/20>>1 RBC  Intra-op: 04/04/2020>>2 PCs      2 FFP Post-op: 04/04/2020>>3 PC's, 2 FFP 04/14/20>>1 PC's 04/15/20>> 1 PC's 04/18/20>>1  PC's  Cultures:  -03/25/20- blood cultures>>negative - 03/23/2020-blood cultures>>negative - 04/03/20 - respiratory culture>>neg   Device: -St [name] -Therapies: VF 194 - therapy on  Drips: - Milrinone 0.125 mcg/kg/min >> off 04/09/20 - Levo 1 mcg/min>> off 04/10/20 - Amiodarone 30 mg/hr-off - Tube feed: pt refusing  Adverse Events on VAD: >>03/25/20 CVVH Started >>03/22/2020 left hemothorax; cardiac arrest >>04/14/20 HD  Pt Education:  1. No VAD education with patient this am; he is refusing to engage and just wants "to be left alone". Will contact caregiver and ask her to come in today for patient/family conference.  2. No family at bedside.   Plan/Recommendations:  1. Call VAD coordinator for any equipment or drive line issues.  2. Dressing changes Monday / Thursday per nurse champion or VAD coordinator.   Emerson Monte RN Reid Hope King Coordinator  Office: [phone]  24/7 Pager: [phone]

## 2020-04-25 NOTE — Plan of Care (Signed)
  Problem: Clinical Measurements: Goal: Will remain free from infection Outcome: Progressing   Problem: Cardiac: Goal: Ability to maintain an adequate cardiac output will improve Outcome: Progressing    Problem: Health Behavior/Discharge Planning: Goal: Ability to manage health-related needs will improve Outcome: Not Progressing   Problem: Nutrition: Goal: Adequate nutrition will be maintained Outcome: Not Progressing   Problem: Education: Goal: Knowledge of the prescribed therapeutic regimen will improve Outcome: Not Progressing   Problem: Activity: Goal: Risk for activity intolerance will decrease Outcome: Not Progressing  Pt minimally interactive with staff, refusing AM meds.

## 2020-04-25 NOTE — Progress Notes (Signed)
Physical Therapy Treatment Patient Details Name: [name] [name] MRN: [id number] DOB: 09/02/1951 Today's Date: 04/25/2020    History of Present Illness 69 yo male with history of CAD with prior stenting of the LAD, Diagonal and OM in an outside hospital, ischemic cardiomyopathy with ICD in place, chronic systolic CHF, MVP s/p mitral valve repair, permanent atrial fib on chronic anticoagulation, HTN, HLD, DM, depression admitted to [facility] with progressive weakness, fatigue, dyspnea and chest pressure. Troponin elevated at 0.06. He was transferred to [facility] for cardiac cath, showing severe 3 vessel disease. Plan for CABG but pt with AKI. Pt underwent multiple tooth extraction on 4/6 and LVAD placement on 4/8. Postop day 7 patient was noted to be agitated the CT head was obtained showing a large posterior right MCA infarct. Pt with arrest on 4/20 and reintubated, extubated 4/25. Pt on CRRT until 04/12/20 when transitioned to HD.Tunnelled HD cath 5/5    PT Comments    Patient sleepy post dialysis but alertness improved with step daughter, Ander Purpura, present for session. Requires assist of 2 for bed mobility with decreased initiation and sequencing. Pt with no recall of sternal precautions. However, today pt oriented to situation, time and place with cues. Requires assist of 2 to stand from elevated bed height with max cues. Limited standing tolerance due to weakness/fatigue. Tolerated there ex sitting in chair. More cooperative today with regards to mobility. Will continue to follow and progress as tolerated/allowed.     Follow Up Recommendations  CIR;Supervision/Assistance - 24 hour     Equipment Recommendations  Rolling walker with 5" wheels;3in1 (PT);Wheelchair (measurements PT)    Recommendations for Other Services       Precautions / Restrictions Precautions Precautions: Fall;Sternal Precaution Booklet Issued: No Precaution Comments: pt with no carry over of precautions;  cortrak Restrictions Weight Bearing Restrictions: Yes Other Position/Activity Restrictions: sternal precautions    Mobility  Bed Mobility Overal bed mobility: Needs Assistance Bed Mobility: Supine to Sit     Supine to sit: +2 for physical assistance;Max assist     General bed mobility comments: Pt minimally moving LEs; assist with trunk and scooting bottom to get to EOB.  Transfers Overall transfer level: Needs assistance Equipment used: Ambulation equipment used Transfers: Sit to/from Stand Sit to Stand: Mod assist;+2 physical assistance;From elevated surface         General transfer comment: modA of 2 to power up from bed, attempted to stand from lower bed height and pt not able to clear bottom despite Max A of 2; minAx2 from stedy seat. Cues to keep hands onknees. transferred to chair via stedy.  Ambulation/Gait             General Gait Details: unable   Stairs             Wheelchair Mobility    Modified Rankin (Stroke Patients Only) Modified Rankin (Stroke Patients Only) Pre-Morbid Rankin Score: No symptoms Modified Rankin: Severe disability     Balance Overall balance assessment: Needs assistance Sitting-balance support: Feet supported;No upper extremity supported Sitting balance-Leahy Scale: Poor Sitting balance - Comments: Initially Mod A due to posterior lean progressing to min A-min guard for unsupported sitting. Fatigues and poor attention to task. Also noted to have sleepy post dialysis   Standing balance support: During functional activity Standing balance-Leahy Scale: Poor Standing balance comment: stood in stedy with +2 min assist, tolerates only for about 10 seconds or less  Cognition Arousal/Alertness: (eyes closed for most of session) Behavior During Therapy: Flat affect Overall Cognitive Status: Impaired/Different from baseline Area of Impairment: Orientation;Attention;Memory;Following  commands;Safety/judgement;Awareness                 Orientation Level: Disoriented to;Place(initially) Current Attention Level: Focused Memory: Decreased short-term memory;Decreased recall of precautions Following Commands: Follows one step commands inconsistently;Follows one step commands with increased time Safety/Judgement: Decreased awareness of deficits Awareness: Intellectual Problem Solving: Slow processing;Decreased initiation;Difficulty sequencing;Requires verbal cues General Comments: Oriented to month/year, situation and initially not place, "we are at some apts," but with cues able to state [facility]. Became more alert and responsive when step daughter  present. Excited about chocolate milk. Poor motor planning/initiation.      Exercises General Exercises - Lower Extremity Long Arc Quad: AROM;Both;10 reps;Seated Hip Flexion/Marching: AROM;Both;10 reps;Seated    General Comments General comments (skin integrity, edema, etc.): VSS; step daughter, Ander Purpura, present.      Pertinent Vitals/Pain Pain Assessment: Faces Faces Pain Scale: Hurts little more Pain Location: generalized with movement Pain Descriptors / Indicators: Other (Comment)(yelling out during standing) Pain Intervention(s): Repositioned    Home Living                      Prior Function            PT Goals (current goals can now be found in the care plan section) Progress towards PT goals: Progressing toward goals(slowly)    Frequency    Min 3X/week      PT Plan Current plan remains appropriate    Co-evaluation PT/OT/SLP Co-Evaluation/Treatment: Yes Reason for Co-Treatment: Complexity of the patient's impairments (multi-system involvement);Necessary to address cognition/behavior during functional activity;For patient/therapist safety;To address functional/ADL transfers PT goals addressed during session: Mobility/safety with mobility        AM-PAC PT "6 Clicks" Mobility    Outcome Measure  Help needed turning from your back to your side while in a flat bed without using bedrails?: A Lot Help needed moving from lying on your back to sitting on the side of a flat bed without using bedrails?: Total Help needed moving to and from a bed to a chair (including a wheelchair)?: A Lot Help needed standing up from a chair using your arms (e.g., wheelchair or bedside chair)?: A Lot Help needed to walk in hospital room?: Total Help needed climbing 3-5 steps with a railing? : Total 6 Click Score: 9    End of Session Equipment Utilized During Treatment: Gait belt Activity Tolerance: Patient limited by fatigue;Patient limited by lethargy Patient left: in chair;with call bell/phone within reach;with chair alarm set;with family/visitor present Nurse Communication: Mobility status PT Visit Diagnosis: Muscle weakness (generalized) (M62.81);Other abnormalities of gait and mobility (R26.89);Other symptoms and signs involving the nervous system (R29.898)     Time: 5573-2202 PT Time Calculation (min) (ACUTE ONLY): 31 min  Charges:  $Therapeutic Activity: 8-22 mins                     Marisa Severin, PT, DPT Acute Rehabilitation Services Pager [phone] Office [place] 04/25/2020, 4:06 PM

## 2020-04-25 NOTE — Progress Notes (Signed)
ANTICOAGULATION CONSULT NOTE  Pharmacy Consult for warfarin Indication: post op LVAD  / Aflutter  Allergies  Allergen Reactions  . Liraglutide Nausea And Vomiting  . Lisinopril Cough    Patient Measurements: Height: 6' (182.9 cm) Weight: 100 kg (220 lb 7.4 oz) IBW/kg (Calculated) : 77.6 Heparin Dosing Weight: 101.2 kg  Vital Signs: Temp: 97.6 F (36.4 C) (05/14 0934) Temp Source: Oral (05/14 0934) BP: 98/57 (05/14 1030) Pulse Rate: 63 (05/14 1030)  Labs: Recent Labs    04/23/20 0326 04/23/20 0326 04/23/20 1155 04/24/20 0831 04/25/20 0230 04/25/20 0751  HGB  --    < > 8.2* 8.4* 7.6*  --   HCT  --   --  25.9* 27.1* 24.8*  --   PLT  --   --  315 316 281  --   LABPROT 26.8*   < >  --  29.9* 13.2 24.9*  INR 2.6*   < >  --  3.0* 1.0 2.3*  CREATININE 5.59*  --   --  4.17* 5.52*  --    < > = values in this interval not displayed.    Estimated Creatinine Clearance: 15.3 mL/min (A) (by C-G formula based on SCr of 5.52 mg/dL (H)).   Assessment: 86 YOM with atrial fibrillation on Eliquis PTA s/p R & L heart cath found to have 3v disease s/p  LVAD  Implant 4/8. Warfarin initiated on 4/9 after discussing with MD.  CT on 4/15 - found to have large right MCA stroke. INR goal reduced 5/4.  INR now within goal range at 2.3 after holding x2 doses and pt having more intake.  Goal of Therapy:  INR 1.8-2.3 Monitor platelets by anticoagulation protocol: Yes   Plan:  -Warfarin 0.5mg  x1 tonight -Daily PT/INR   Arrie Senate, PharmD, BCPS Clinical Pharmacist [phone] Please check AMION for all [facility] numbers 04/25/2020

## 2020-04-25 NOTE — Progress Notes (Signed)
Calorie Count Note: Day 2 Results  48 hour calorie count ordered.  Spoke with RN via phone. RN reports pt has accepted tube feeding today and is running at goal (Nepro @50ml /hr). Per RN, MD has encouraged family to bring in whatever food they feel the pt will eat.   Diet: Dysphagia 3, thin liquids Supplements:  -Nepro Shake po BID, each supplement provides 425 kcal and 19 grams protein -Magic cup TID with meals, each supplement provides 290 kcal and 9 grams of protein  Breakfast: pt refused Lunch: pt refused Dinner: 50 kcals, 2 grams protein Supplements: pt refused  Total 24-hour PO intake: 50 kcal (2% of minimum estimated needs)  2 grams protein (2% of minimum estimated needs)  Nutrition Dx: Increased nutrient needsrelated to post-op healing, chronic illnessas evidenced by estimated needs.  Goal: Patient will meet greater than or equal to 90% of their needs  Intervention:  -D/c calorie count, pt not eating enough to meet needs -Continue offering Nepro shake BID -Continue offering Magic Cup TID  -Continue rena-vit -Continue TF via Cortrak until pt demonstrating consistent and adequate po intake.  Tube feeding recommendations: Nepro @ 44ml/hr Pro-stat 40ml BID Provides 2360 kcals, 127 g of protein and 876 mL of free water Meets 100% estimated calorie and protein needs  Larkin Ina, MS, RD, LDN RD pager number and weekend/on-call pager number located in [place].

## 2020-04-26 LAB — CBC
HCT: 24.1 % — ABNORMAL LOW (ref 39.0–52.0)
Hemoglobin: 7.4 g/dL — ABNORMAL LOW (ref 13.0–17.0)
MCH: 27.2 pg (ref 26.0–34.0)
MCHC: 30.7 g/dL (ref 30.0–36.0)
MCV: 88.6 fL (ref 80.0–100.0)
Platelets: 268 10*3/uL (ref 150–400)
RBC: 2.72 MIL/uL — ABNORMAL LOW (ref 4.22–5.81)
RDW: 15.4 % (ref 11.5–15.5)
WBC: 8.4 10*3/uL (ref 4.0–10.5)
nRBC: 0 % (ref 0.0–0.2)

## 2020-04-26 LAB — RENAL FUNCTION PANEL
Albumin: 2.3 g/dL — ABNORMAL LOW (ref 3.5–5.0)
Anion gap: 9 (ref 5–15)
BUN: 36 mg/dL — ABNORMAL HIGH (ref 8–23)
CO2: 27 mmol/L (ref 22–32)
Calcium: 8.3 mg/dL — ABNORMAL LOW (ref 8.9–10.3)
Chloride: 99 mmol/L (ref 98–111)
Creatinine, Ser: 3.63 mg/dL — ABNORMAL HIGH (ref 0.61–1.24)
GFR calc Af Amer: 19 mL/min — ABNORMAL LOW (ref >60)
GFR calc non Af Amer: 16 mL/min — ABNORMAL LOW (ref >60)
Glucose, Bld: 121 mg/dL — ABNORMAL HIGH (ref 70–99)
Phosphorus: 2.8 mg/dL (ref 2.5–4.6)
Potassium: 4 mmol/L (ref 3.5–5.1)
Sodium: 135 mmol/L (ref 135–145)

## 2020-04-26 LAB — GLUCOSE, CAPILLARY
Glucose-Capillary: 102 mg/dL — ABNORMAL HIGH (ref 70–99)
Glucose-Capillary: 116 mg/dL — ABNORMAL HIGH (ref 70–99)
Glucose-Capillary: 137 mg/dL — ABNORMAL HIGH (ref 70–99)
Glucose-Capillary: 147 mg/dL — ABNORMAL HIGH (ref 70–99)
Glucose-Capillary: 152 mg/dL — ABNORMAL HIGH (ref 70–99)
Glucose-Capillary: 162 mg/dL — ABNORMAL HIGH (ref 70–99)

## 2020-04-26 LAB — PROTIME-INR
INR: 1.9 — ABNORMAL HIGH (ref 0.8–1.2)
Prothrombin Time: 20.7 seconds — ABNORMAL HIGH (ref 11.4–15.2)

## 2020-04-26 LAB — LACTATE DEHYDROGENASE: LDH: 246 U/L — ABNORMAL HIGH (ref 98–192)

## 2020-04-26 LAB — MAGNESIUM: Magnesium: 2.1 mg/dL (ref 1.7–2.4)

## 2020-04-26 MED ORDER — WARFARIN 0.5 MG HALF TABLET
0.5000 mg | ORAL_TABLET | Freq: Once | ORAL | Status: AC
  Administered 2020-04-26: 0.5 mg via ORAL
  Filled 2020-04-26: qty 1

## 2020-04-26 NOTE — Progress Notes (Signed)
Patient ID: [name] [name], male   DOB: February 21, 1951, 69 y.o.   MRN: [id number]    Advanced Heart Failure Rounding Note   Subjective:    - S/p HM3 VAD 4/8 w MAZE procedure + LAA clipping. - Extubated 4/9 - 4/12 LVAD speed increased to 5400 --> echo moderate-severe RV dysfunction, septum mildly left shifted, trivial pericardial effusion.  LVAD speed cut back to 5300.  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  - 4/20 developed hemothorax requiring emergent CT placement and eventual VATS - 4/21 EEG done given concerns for posturing and c/w moderate to severe diffuse encephalopathy. No seizures or epileptiform discharges were seen  - 04/03/20 Pump Stop --> controller change out. CT of head unchanged R MCA CVA - 04/06/20 Extubated - 5/1 CRRT stopped. Had first iHD 5/3.  - 5/4 1uRBC - 5/6 1u RBC  Increased participation with PT yesterday per notes.  Will not open his eyes for me today.  MAP 95. Had HD yesterday.   VAD Interrogation  Flow 4.1  Speed 5300, Power 3.7 Pulse Index 3.7 VAD interrogated personally. Parameters stable.   Objective:   Weight Range:  Vital Signs:   Temp:  [97 F (36.1 C)-98.6 F (37 C)] 97.6 F (36.4 C) (05/15 0807) Pulse Rate:  [59-94] 81 (05/15 0807) Resp:  [12-20] 14 (05/15 0807) BP: (97-120)/(46-95) 120/95 (05/15 0807) SpO2:  [97 %] 97 % (05/15 0333) Weight:  [97.3 kg-99.5 kg] 97.3 kg (05/15 0333) Last BM Date: 04/22/20  Weight change: Filed Weights   04/25/20 0934 04/25/20 1359 04/26/20 0333  Weight: 100 kg 99.5 kg 97.3 kg    Intake/Output:   Intake/Output Summary (Last 24 hours) at 04/26/2020 1024 Last data filed at 04/26/2020 0800 Gross per 24 hour  Intake 1740.5 ml  Output 1676 ml  Net 64.5 ml    MAP 95  Physical Exam: General: Well appearing this am. NAD.  HEENT: Normal. Neck: Supple, JVP 7-8 cm. Carotids OK.  Cardiac:  Mechanical heart sounds with LVAD hum present.  Lungs:  CTAB, normal effort.  Abdomen:  NT, ND, no  HSM. No bruits or masses. +BS  LVAD exit site: Well-healed and incorporated. Dressing dry and intact. No erythema or drainage. Stabilization device present and accurately applied. Driveline dressing changed daily per sterile technique. Extremities:  Warm and dry. No cyanosis, clubbing, rash, or edema.  Neuro:  Will not interact.   Labs: Basic Metabolic Panel: Recent Labs  Lab 04/22/20 0254 04/22/20 0254 04/23/20 0326 04/23/20 0326 04/24/20 0831 04/25/20 0230 04/26/20 0259  NA 132*  --  134*  --  133* 133* 135  K 4.9  --  4.5  --  3.7 3.7 4.0  CL 92*  --  94*  --  93* 96* 99  CO2 25  --  26  --  27 26 27   GLUCOSE 143*  --  109*  --  77 160* 121*  BUN 141*  --  73*  --  39* 67* 36*  CREATININE 8.32*  --  5.59*  --  4.17* 5.52* 3.63*  CALCIUM 8.6*   < > 8.7*   < > 8.5* 8.4* 8.3*  MG 3.1*  --  2.6*  --  2.2 2.3 2.1  PHOS 6.3*  --  5.4*  --  4.5 6.1* 2.8   < > = values in this interval not displayed.    Liver Function Tests: Recent Labs  Lab 04/22/20 0254 04/23/20 0326 04/24/20 0831  04/25/20 0230 04/26/20 0259  ALBUMIN 2.3* 2.2* 2.4* 2.3* 2.3*   No results for input(s): LIPASE, AMYLASE in the last 168 hours. No results for input(s): AMMONIA in the last 168 hours.  CBC: Recent Labs  Lab 04/22/20 0254 04/23/20 1155 04/24/20 0831 04/25/20 0230 04/26/20 0259  WBC 8.1 7.9 7.5 8.9 8.4  HGB 7.6* 8.2* 8.4* 7.6* 7.4*  HCT 24.0* 25.9* 27.1* 24.8* 24.1*  MCV 86.6 87.5 88.3 87.9 88.6  PLT 292 315 316 281 268    Cardiac Enzymes: No results for input(s): CKTOTAL, CKMB, CKMBINDEX, TROPONINI in the last 168 hours.  BNP: BNP (last 3 results) Recent Labs    04/02/20 2342 04/10/20 0132 04/17/20 0043  BNP 332.7* 267.2* 582.1*    ProBNP (last 3 results) No results for input(s): PROBNP in the last 8760 hours.    Other results:  Imaging: No results found.   Medications:     Scheduled Medications: . sodium chloride   Intravenous Once  . aspirin  81 mg Per Tube  Daily  . atorvastatin  80 mg Per Tube q1800  . bisacodyl  10 mg Oral Daily   Or  . bisacodyl  10 mg Rectal Daily  . carvedilol  3.125 mg Oral BID WC  . Chlorhexidine Gluconate Cloth  6 each Topical Daily  . Chlorhexidine Gluconate Cloth  6 each Topical Q0600  . Chlorhexidine Gluconate Cloth  6 each Topical Q0600  . citalopram  10 mg Per Tube Daily  . darbepoetin (ARANESP) injection - NON-DIALYSIS  100 mcg Subcutaneous Q Tue-1800  . feeding supplement (NEPRO CARB STEADY)  237 mL Oral BID BM  . feeding supplement (PRO-STAT SUGAR FREE 64)  30 mL Per Tube BID  . influenza vaccine adjuvanted  0.5 mL Intramuscular Tomorrow-1000  . insulin aspart  0-24 Units Subcutaneous Q4H  . insulin aspart  6 Units Subcutaneous Q4H  . insulin glargine  24 Units Subcutaneous BID  . multivitamin  1 tablet Oral QHS  . pantoprazole (PROTONIX) IV  40 mg Intravenous Daily  . sodium chloride flush  10-40 mL Intracatheter Q12H  . warfarin  0.5 mg Oral ONCE-1600  . Warfarin - Pharmacist Dosing Inpatient   Does not apply q1600    Infusions: . sodium chloride Stopped (04/15/20 1731)  . feeding supplement (NEPRO CARB STEADY) 50 mL/hr at 04/25/20 0901    PRN Medications: sodium chloride, acetaminophen, dextrose, heparin sodium (porcine), hydrALAZINE, levalbuterol, ondansetron (ZOFRAN) IV, oxyCODONE, promethazine, sodium chloride flush, sorbitol, traMADol   Assessment/Plan:   1. Acute on chronic systolic HF -> cardiogenic shock-> S/p HM3 LVAD - Echo 2016 EF 30-35% - Echo 1/21 EF 25% - Admitted with NYHA IV symptoms and AKI with attempts at diuresis. Initial co-ox 39% - Echo this admit: EF 10-15% moderate RV dysfunction - R/LHC cath 3/21 with severe 3v CAD and low output with CI 1.7.   - S/p HM3 VAD 4/8  - Continue current dose of carvedilol 3.125 mg twice a day.   - Volume status managed by HD, had HD yesterday.   2. VAD - VAD interrogated personally. Parameters stable. - Had Pump Stop 04/03/20.  Controller changed out with old controller sent back to [facility] for evaluation.  - on aspirin 81 mg daily + coumadin.  - LDH 231 . We have lowered INR goal to 1.8-2.3 given anemia. - INR today 1.9   3. CAD with unstable angina - s/p previous PCI. - cath 02/14/2020 with severe 3v CAD - now s/p VAD on 03/27/2020   -  No s/s ischemia  4. AKI on CKD 3a -> ESRD - Nephrology consulted. Started CVVHD 4/13.    - Renal US 4/5 unremarkable.  - Hopefully renal function will recover but Renal team not optimistic - He is now off NE w/ stable MAP.  - Off CVVH.  1st iHD 5/3.  - Tunneled HD cath placed 5/5.  - Tolerating iHD. Will need permanent access at some point but given multiple issues including patient's lack of cooperation with care we are holding off  5. Permanent AF - now s/p MAZE + LAA Clipping 4/8   - Continue Carvedilol 3.125 mg twice a day  - On coumadin. INR 1.9.  6. MVP s/p MV repair - On admit with recurrent severe MR on echo and with huge v-waves on PCWP tracing - s/p VAD  7. Hemothoax - s/p CT placement and VATs on 4/22 - CTs pulled 4/26 -Resolved   8. Severe Protein Malnutrition  - Albumin 2.4 - Tube feeds ongoing.  - Speech following--> progressing diet. Still has cor-trak in to meet calorie needs  9. CVA/anoxic brain injury - 03/27/20 CT large posterior MCA ischemic infarct, possibly from atrial fibrillation.  INR was supratherapeutic when CVA found.  Stable LDH, do not think partial pump thrombosis is the culprit. - Still with some L weakness and inattention but improving  - PT/OT/speech following.  - CIR consulted.   10. DM2, poorly controlled - hgbA1c 11.1% - continue insulin   11. Agitation/ Depression  - Celexa 10 mg daily added.  12. Deconditioning - Continue PT/OT. He seemed to work better with PT yesterday per notes.   - CIR recommended but they have been following from distance given issues with non-cooperation    13. Anemia, post-op blood loss - hgb  7.4. no obvious bleeding - transfuse hgb < 7.5, will try to hold off until Monday and give with HD if possible.  If hgb lower, will transfuse tomorrow.   14. Compliance - continues to be a major issue. I am not sure if his lability is related to his CVA or this is baseline. His stepdaughter is very involved and try to help.  - He did not open his eyes for me today.   Hopefully we can get him to CIR next week.    Length of Stay: [place], [place]  10:24 AM  04/26/2020

## 2020-04-26 NOTE — Progress Notes (Signed)
Patient ID: [name] [name], male   DOB: 03/18/1951, 69 y.o.   MRN: [id number]    S: No acute events overnight. Tolerated hemodialysis with 2 L ultrafiltration. Minimally conversant today. Continues to struggle to participate with therapy and medical staff. Not expressed any concerns.    O:BP (!) 120/95 (BP Location: Left Arm)   Pulse 81   Temp 97.6 F (36.4 C) (Axillary)   Resp 14   Ht 6' (1.829 m)   Wt 97.3 kg   SpO2 97%   BMI 29.09 kg/m   Intake/Output Summary (Last 24 hours) at 04/26/2020 0957 Last data filed at 04/26/2020 0344 Gross per 24 hour  Intake 1096.33 ml  Output 1676 ml  Net -579.67 ml   Intake/Output: I/O last 3 completed shifts: In: 1106.3 [I.V.:20; NG/GT:1086.3] Out: 8546 [Urine:175; Other:1501]  Intake/Output this shift:  No intake/output data recorded. Weight change: 2 kg Gen: resting in bed in NAD CVS: mechanical hum Resp: cta, bl chest rise Abd: +BS, soft, nt/nd Ext: trace edema in ble, warm extremeties  Recent Labs  Lab 04/20/20 0253 04/21/20 0237 04/22/20 0254 04/23/20 0326 04/24/20 0831 04/25/20 0230 04/26/20 0259  NA 135 134* 132* 134* 133* 133* 135  K 3.8 4.3 4.9 4.5 3.7 3.7 4.0  CL 94* 94* 92* 94* 93* 96* 99  CO2 26 25 25 26 27 26 27   GLUCOSE 187* 149* 143* 109* 77 160* 121*  BUN 82* 112* 141* 73* 39* 67* 36*  CREATININE 5.80* 7.14* 8.32* 5.59* 4.17* 5.52* 3.63*  ALBUMIN 2.3* 2.3* 2.3* 2.2* 2.4* 2.3* 2.3*  CALCIUM 8.5* 8.6* 8.6* 8.7* 8.5* 8.4* 8.3*  PHOS 5.2* 4.8* 6.3* 5.4* 4.5 6.1* 2.8   Liver Function Tests: Recent Labs  Lab 04/24/20 0831 04/25/20 0230 04/26/20 0259  ALBUMIN 2.4* 2.3* 2.3*   No results for input(s): LIPASE, AMYLASE in the last 168 hours. No results for input(s): AMMONIA in the last 168 hours. CBC: Recent Labs  Lab 04/22/20 0254 04/22/20 0254 04/23/20 1155 04/23/20 1155 04/24/20 0831 04/25/20 0230 04/26/20 0259  WBC 8.1   < > 7.9   < > 7.5 8.9 8.4  HGB 7.6*   < > 8.2*   < > 8.4* 7.6* 7.4*  HCT  24.0*   < > 25.9*   < > 27.1* 24.8* 24.1*  MCV 86.6  --  87.5  --  88.3 87.9 88.6  PLT 292   < > 315   < > 316 281 268   < > = values in this interval not displayed.   Cardiac Enzymes: No results for input(s): CKTOTAL, CKMB, CKMBINDEX, TROPONINI in the last 168 hours. CBG: Recent Labs  Lab 04/25/20 2006 04/25/20 2213 04/25/20 2316 04/26/20 0330 04/26/20 0803  GLUCAP 238* 206* 178* 102* 152*    Iron Studies: No results for input(s): IRON, TIBC, TRANSFERRIN, FERRITIN in the last 72 hours. Studies/Results: No results found. . sodium chloride   Intravenous Once  . aspirin  81 mg Per Tube Daily  . atorvastatin  80 mg Per Tube q1800  . bisacodyl  10 mg Oral Daily   Or  . bisacodyl  10 mg Rectal Daily  . carvedilol  3.125 mg Oral BID WC  . Chlorhexidine Gluconate Cloth  6 each Topical Daily  . Chlorhexidine Gluconate Cloth  6 each Topical Q0600  . Chlorhexidine Gluconate Cloth  6 each Topical Q0600  . citalopram  10 mg Per Tube Daily  . darbepoetin (ARANESP) injection - NON-DIALYSIS  100 mcg Subcutaneous Q  Tue-1800  . feeding supplement (NEPRO CARB STEADY)  237 mL Oral BID BM  . feeding supplement (PRO-STAT SUGAR FREE 64)  30 mL Per Tube BID  . influenza vaccine adjuvanted  0.5 mL Intramuscular Tomorrow-1000  . insulin aspart  0-24 Units Subcutaneous Q4H  . insulin aspart  6 Units Subcutaneous Q4H  . insulin glargine  24 Units Subcutaneous BID  . multivitamin  1 tablet Oral QHS  . pantoprazole (PROTONIX) IV  40 mg Intravenous Daily  . sodium chloride flush  10-40 mL Intracatheter Q12H  . Warfarin - Pharmacist Dosing Inpatient   Does not apply q1600    BMET    Component Value Date/Time   NA 135 04/26/2020 0259   NA 132 (L) 03/07/2020 1501   K 4.0 04/26/2020 0259   CL 99 04/26/2020 0259   CO2 27 04/26/2020 0259   GLUCOSE 121 (H) 04/26/2020 0259   BUN 36 (H) 04/26/2020 0259   BUN 16 03/07/2020 1501   CREATININE 3.63 (H) 04/26/2020 0259   CREATININE 1.29 (H) 12/10/2016  1011   CALCIUM 8.3 (L) 04/26/2020 0259   GFRNONAA 16 (L) 04/26/2020 0259   GFRNONAA 57 (L) 01/28/2015 1558   GFRAA 19 (L) 04/26/2020 0259   GFRAA 66 01/28/2015 1558   CBC    Component Value Date/Time   WBC 8.4 04/26/2020 0259   RBC 2.72 (L) 04/26/2020 0259   HGB 7.4 (L) 04/26/2020 0259   HGB 15.8 04/24/2018 1148   HCT 24.1 (L) 04/26/2020 0259   HCT 45.9 04/24/2018 1148   PLT 268 04/26/2020 0259   PLT 281 04/24/2018 1148   MCV 88.6 04/26/2020 0259   MCV 89 04/24/2018 1148   MCH 27.2 04/26/2020 0259   MCHC 30.7 04/26/2020 0259   RDW 15.4 04/26/2020 0259   RDW 14.2 04/24/2018 1148   LYMPHSABS 0.4 (L) 04/02/2020 0429   LYMPHSABS 1.1 04/24/2018 1148   MONOABS 2.2 (H) 04/02/2020 0429   EOSABS 0.0 04/02/2020 0429   EOSABS 0.2 04/24/2018 1148   BASOSABS 0.1 04/02/2020 0429   BASOSABS 0.1 04/24/2018 1148   Assessment/Plan:  1. AKI on CKD: BL Crt 1.5 on 03/07/20. AKI in setting of cardiogenic shock, pressors, s/p LVAD 03/21/2020. CRRT initiated on 03/25/20 and stopped 04/12/20.Transitioned to intermittent HD 04/14/20.  RRT dependent for >30 days unlikely to recover 1. Plan for HD  MWF-  Poor OP candidate due to somnolence, minimal cooperation, possible inability to sit up. Not participating in therapy. 2. No heparin with HD on systemic anticoagulation  2. Vascular access- RIJ TDC placed 04/16/20 by IR. 1. Obtain AVF/AVG when he is more stable/ready for discharge. Given issues as above may not be an outpt candidate unless he has significant progression.  3. sCHF s/p LVAD 04/10/2020 - cardiology managing. UF w/ HD 4. A fib s/p MAZE and LAA clipping 4/8 5. R MCA ischemic stroke- large, seen on CT on 03/27/20. Likely contributing to poor mental state 6. Left hemothorax s/p VATS 04/09/2020. 7. Anemia of CKD, multifactorial-s/p transfusion 04/14/20 and 5/7.Continue to follow, transfuse PRN.  and continue ESA. Iron studies likely not accurate given recent transfusion 8. Hyperphosphatemia (BMD): Phos 2.8  today, improves greatly with dialysis. Minimal p.o. intake. Awaiting PTH results. Calcium acceptable (corrects to normal accounting for albumin).    Reesa Chew  [employer] [phone]

## 2020-04-26 NOTE — Progress Notes (Signed)
ANTICOAGULATION CONSULT NOTE  Pharmacy Consult for warfarin Indication: post op LVAD  / Aflutter  Allergies  Allergen Reactions  . Liraglutide Nausea And Vomiting  . Lisinopril Cough    Patient Measurements: Height: 6' (182.9 cm) Weight: 97.3 kg (214 lb 8.1 oz) IBW/kg (Calculated) : 77.6 Heparin Dosing Weight: 101.2 kg  Vital Signs: Temp: 97.6 F (36.4 C) (05/15 0807) Temp Source: Axillary (05/15 0807) BP: 120/95 (05/15 0807) Pulse Rate: 81 (05/15 0807)  Labs: Recent Labs    04/24/20 0831 04/24/20 0831 04/25/20 0230 04/25/20 0751 04/26/20 0259  HGB 8.4*   < > 7.6*  --  7.4*  HCT 27.1*  --  24.8*  --  24.1*  PLT 316  --  281  --  268  LABPROT 29.9*   < > 13.2 24.9* 20.7*  INR 3.0*   < > 1.0 2.3* 1.9*  CREATININE 4.17*  --  5.52*  --  3.63*   < > = values in this interval not displayed.    Estimated Creatinine Clearance: 22.9 mL/min (A) (by C-G formula based on SCr of 3.63 mg/dL (H)).   Assessment: 2 YOM with atrial fibrillation on Eliquis PTA s/p R & L heart cath found to have 3v disease s/p  LVAD  Implant 4/8. Warfarin initiated on 4/9 after discussing with MD.  CT on 4/15 - found to have large right MCA stroke. INR goal reduced 5/4.  INR now within goal range at 1.9 after holding x 2 doses and pt having more intake.  Goal of Therapy:  INR 1.8-2.3 Monitor platelets by anticoagulation protocol: Yes   Plan:  -Warfarin 0.5 mg x1 again tonight -Daily PT/INR   Marguerite Olea, [facility] Clinical Pharmacist Phone [phone]  04/26/2020 10:02 AM

## 2020-04-27 LAB — CBC
HCT: 23 % — ABNORMAL LOW (ref 39.0–52.0)
Hemoglobin: 7.1 g/dL — ABNORMAL LOW (ref 13.0–17.0)
MCH: 27.8 pg (ref 26.0–34.0)
MCHC: 30.9 g/dL (ref 30.0–36.0)
MCV: 90.2 fL (ref 80.0–100.0)
Platelets: 248 10*3/uL (ref 150–400)
RBC: 2.55 MIL/uL — ABNORMAL LOW (ref 4.22–5.81)
RDW: 15.9 % — ABNORMAL HIGH (ref 11.5–15.5)
WBC: 8.7 10*3/uL (ref 4.0–10.5)
nRBC: 0 % (ref 0.0–0.2)

## 2020-04-27 LAB — MAGNESIUM: Magnesium: 2.3 mg/dL (ref 1.7–2.4)

## 2020-04-27 LAB — RENAL FUNCTION PANEL
Albumin: 2.2 g/dL — ABNORMAL LOW (ref 3.5–5.0)
Anion gap: 10 (ref 5–15)
BUN: 61 mg/dL — ABNORMAL HIGH (ref 8–23)
CO2: 26 mmol/L (ref 22–32)
Calcium: 8.4 mg/dL — ABNORMAL LOW (ref 8.9–10.3)
Chloride: 99 mmol/L (ref 98–111)
Creatinine, Ser: 5.14 mg/dL — ABNORMAL HIGH (ref 0.61–1.24)
GFR calc Af Amer: 12 mL/min — ABNORMAL LOW (ref >60)
GFR calc non Af Amer: 10 mL/min — ABNORMAL LOW (ref >60)
Glucose, Bld: 130 mg/dL — ABNORMAL HIGH (ref 70–99)
Phosphorus: 3.6 mg/dL (ref 2.5–4.6)
Potassium: 4.2 mmol/L (ref 3.5–5.1)
Sodium: 135 mmol/L (ref 135–145)

## 2020-04-27 LAB — ECHO INTRAOPERATIVE TEE
Height: 72 in
Weight: 3566.16 oz

## 2020-04-27 LAB — GLUCOSE, CAPILLARY
Glucose-Capillary: 122 mg/dL — ABNORMAL HIGH (ref 70–99)
Glucose-Capillary: 146 mg/dL — ABNORMAL HIGH (ref 70–99)
Glucose-Capillary: 148 mg/dL — ABNORMAL HIGH (ref 70–99)
Glucose-Capillary: 141 mg/dL — ABNORMAL HIGH (ref 70–99)
Glucose-Capillary: 185 mg/dL — ABNORMAL HIGH (ref 70–99)

## 2020-04-27 LAB — PROTIME-INR
INR: 1.7 — ABNORMAL HIGH (ref 0.8–1.2)
Prothrombin Time: 19.6 seconds — ABNORMAL HIGH (ref 11.4–15.2)

## 2020-04-27 LAB — MRSA PCR SCREENING: MRSA by PCR: NEGATIVE

## 2020-04-27 LAB — LACTATE DEHYDROGENASE: LDH: 210 U/L — ABNORMAL HIGH (ref 98–192)

## 2020-04-27 LAB — PREPARE RBC (CROSSMATCH)

## 2020-04-27 MED ORDER — SODIUM CHLORIDE 0.9% IV SOLUTION: Freq: Once | INTRAVENOUS | Status: AC

## 2020-04-27 MED ORDER — CHLORHEXIDINE GLUCONATE CLOTH 2 % EX PADS
6.0000 | MEDICATED_PAD | Freq: Every day | CUTANEOUS | Status: DC
  Administered 2020-04-28 – 2020-05-02 (×5): 6 via TOPICAL

## 2020-04-27 MED ORDER — WARFARIN SODIUM 1 MG PO TABS
1.0000 mg | ORAL_TABLET | Freq: Once | ORAL | Status: AC
  Administered 2020-04-27: 1 mg via ORAL
  Filled 2020-04-27: qty 1

## 2020-04-27 NOTE — Progress Notes (Signed)
ANTICOAGULATION CONSULT NOTE  Pharmacy Consult for warfarin Indication: post op LVAD  / Aflutter  Allergies  Allergen Reactions  . Liraglutide Nausea And Vomiting  . Lisinopril Cough    Patient Measurements: Height: 6' (182.9 cm) Weight: 96.7 kg (213 lb 3 oz) IBW/kg (Calculated) : 77.6 Heparin Dosing Weight: 101.2 kg  Vital Signs: Temp: 98.4 F (36.9 C) (05/16 1220) Temp Source: Oral (05/16 1220) BP: 114/94 (05/16 1220) Pulse Rate: 78 (05/16 1230)  Labs: Recent Labs    04/25/20 0230 04/25/20 0230 04/25/20 0751 04/26/20 0259 04/27/20 0218  HGB 7.6*   < >  --  7.4* 7.1*  HCT 24.8*  --   --  24.1* 23.0*  PLT 281  --   --  268 248  LABPROT 13.2   < > 24.9* 20.7* 19.6*  INR 1.0   < > 2.3* 1.9* 1.7*  CREATININE 5.52*  --   --  3.63* 5.14*   < > = values in this interval not displayed.    Estimated Creatinine Clearance: 16.1 mL/min (A) (by C-G formula based on SCr of 5.14 mg/dL (H)).   Assessment: [name] [name] with atrial fibrillation on Eliquis PTA s/p R & L heart cath found to have 3v disease s/p  LVAD  Implant 4/8. Warfarin initiated on 4/9 after discussing with MD.  CT on 4/15 - found to have large right MCA stroke. INR goal reduced 5/4.  INR just slightly below goal range at 1.7 after holding x 2 doses and pt having more intake.  Goal of Therapy:  INR 1.8-2.3 Monitor platelets by anticoagulation protocol: Yes   Plan:  -Warfarin 1 mg x 1 tonight -Daily PT/INR   Marguerite Olea, [facility] Clinical Pharmacist Phone [phone]  04/27/2020 12:44 PM

## 2020-04-27 NOTE — Plan of Care (Signed)
  Problem: Education: Goal: Knowledge of General Education information will improve Description: Including pain rating scale, medication(s)/side effects and non-pharmacologic comfort measures Outcome: Progressing   Problem: Health Behavior/Discharge Planning: Goal: Ability to manage health-related needs will improve Outcome: Progressing   Problem: Clinical Measurements: Goal: Ability to maintain clinical measurements within normal limits will improve Outcome: Progressing Goal: Will remain free from infection Outcome: Progressing Goal: Diagnostic test results will improve Outcome: Progressing Goal: Respiratory complications will improve Outcome: Progressing Goal: Cardiovascular complication will be avoided Outcome: Progressing   Problem: Nutrition: Goal: Adequate nutrition will be maintained Outcome: Progressing   Problem: Elimination: Goal: Will not experience complications related to bowel motility Outcome: Progressing   Problem: Safety: Goal: Ability to remain free from injury will improve Outcome: Progressing   Problem: Skin Integrity: Goal: Risk for impaired skin integrity will decrease Outcome: Progressing   Problem: Education: Goal: Knowledge of the prescribed therapeutic regimen will improve Outcome: Progressing   Problem: Activity: Goal: Risk for activity intolerance will decrease Outcome: Progressing   Problem: Cardiac: Goal: Ability to maintain an adequate cardiac output will improve Outcome: Progressing   Problem: Coping: Goal: Level of anxiety will decrease Outcome: Progressing   Problem: Fluid Volume: Goal: Risk for excess fluid volume will decrease Outcome: Progressing   Problem: Clinical Measurements: Goal: Ability to maintain clinical measurements within normal limits will improve Outcome: Progressing Goal: Will remain free from infection Outcome: Progressing   Problem: Respiratory: Goal: Will regain and/or maintain adequate  ventilation Outcome: Progressing   Problem: Education: Goal: Knowledge of secondary prevention will improve Outcome: Progressing Goal: Knowledge of patient specific risk factors addressed and post discharge goals established will improve Outcome: Progressing Goal: Individualized Educational Video(s) Outcome: Progressing

## 2020-04-27 NOTE — Progress Notes (Signed)
Patient ID: [name] [name], male   DOB: 1951-10-24, 69 y.o.   MRN: [id number]    Advanced Heart Failure Rounding Note   Subjective:    - S/p HM3 VAD 4/8 w MAZE procedure + LAA clipping. - Extubated 4/9 - 4/12 LVAD speed increased to 5400 --> echo moderate-severe RV dysfunction, septum mildly left shifted, trivial pericardial effusion.  LVAD speed cut back to 5300.  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  - 4/20 developed hemothorax requiring emergent CT placement and eventual VATS - 4/21 EEG done given concerns for posturing and c/w moderate to severe diffuse encephalopathy. No seizures or epileptiform discharges were seen  - 04/03/20 Pump Stop --> controller change out. CT of head unchanged R MCA CVA - 04/06/20 Extubated - 5/1 CRRT stopped. Had first iHD 5/3.  - 5/4 1uRBC - 5/6 1u RBC  Sat up in chair for 2-3 hours yesterday and then back to bed. Per RN - he would only do minimum asked of him. This am, said hello to me and said he was ok then stopped interacting.   VAD Interrogation  Flow 4.0  Speed 5300, Power 3.8 Pulse Index 4.2 ,vadi   Objective:   Weight Range:  Vital Signs:   Temp:  [97.2 F (36.2 C)-98.7 F (37.1 C)] 97.2 F (36.2 C) (05/16 0405) Pulse Rate:  [64-82] 82 (05/16 0405) Resp:  [11-18] 18 (05/16 0405) BP: (103-150)/(79-105) 107/80 (05/16 0405) SpO2:  [98 %-100 %] 98 % (05/16 0405) Weight:  [96.7 kg] 96.7 kg (05/16 0500) Last BM Date: 04/22/20  Weight change: Filed Weights   04/25/20 1359 04/26/20 0333 04/27/20 0500  Weight: 99.5 kg 97.3 kg 96.7 kg    Intake/Output:   Intake/Output Summary (Last 24 hours) at 04/27/2020 0613 Last data filed at 04/26/2020 1800 Gross per 24 hour  Intake 1484.17 ml  Output 50 ml  Net 1434.17 ml    MAP 80-90s   Physical Exam: General:  Lying in bed NAD.  HEENT: normal  +cor-trak Neck: supple. JVP not elevated.  Carotids 2+ bilat; no bruits. No lymphadenopathy or thryomegaly appreciated. Cor:  LVAD hum. + tunneled cath Lungs: Clear. Abdomen: obese soft, nontender, non-distended. No hepatosplenomegaly. No bruits or masses. Good bowel sounds. Driveline site clean. Anchor in place.  Extremities: no cyanosis, clubbing, rash. Warm no edema  Neuro: alert but not very interactive. Moves all 4  Labs: Basic Metabolic Panel: Recent Labs  Lab 04/22/20 0254 04/22/20 0254 04/23/20 0326 04/23/20 0326 04/24/20 0831 04/24/20 0831 04/25/20 0230 04/26/20 0259 04/27/20 0218  NA 132*   < > 134*  --  133*  --  133* 135 135  K 4.9   < > 4.5  --  3.7  --  3.7 4.0 4.2  CL 92*   < > 94*  --  93*  --  96* 99 99  CO2 25   < > 26  --  27  --  26 27 26   GLUCOSE 143*   < > 109*  --  77  --  160* 121* 130*  BUN 141*   < > 73*  --  39*  --  67* 36* 61*  CREATININE 8.32*   < > 5.59*  --  4.17*  --  5.52* 3.63* 5.14*  CALCIUM 8.6*   < > 8.7*   < > 8.5*   < > 8.4* 8.3* 8.4*  MG 3.1*  --  2.6*  --  2.2  --  2.3 2.1  --   PHOS 6.3*   < > 5.4*  --  4.5  --  6.1* 2.8 3.6   < > = values in this interval not displayed.    Liver Function Tests: Recent Labs  Lab 04/23/20 0326 04/24/20 0831 04/25/20 0230 04/26/20 0259 04/27/20 0218  ALBUMIN 2.2* 2.4* 2.3* 2.3* 2.2*   No results for input(s): LIPASE, AMYLASE in the last 168 hours. No results for input(s): AMMONIA in the last 168 hours.  CBC: Recent Labs  Lab 04/23/20 1155 04/24/20 0831 04/25/20 0230 04/26/20 0259 04/27/20 0218  WBC 7.9 7.5 8.9 8.4 8.7  HGB 8.2* 8.4* 7.6* 7.4* 7.1*  HCT 25.9* 27.1* 24.8* 24.1* 23.0*  MCV 87.5 88.3 87.9 88.6 90.2  PLT 315 316 281 268 248    Cardiac Enzymes: No results for input(s): CKTOTAL, CKMB, CKMBINDEX, TROPONINI in the last 168 hours.  BNP: BNP (last 3 results) Recent Labs    04/02/20 2342 04/10/20 0132 04/17/20 0043  BNP 332.7* 267.2* 582.1*    ProBNP (last 3 results) No results for input(s): PROBNP in the last 8760 hours.    Other results:  Imaging: No results  found.   Medications:     Scheduled Medications: . sodium chloride   Intravenous Once  . aspirin  81 mg Per Tube Daily  . atorvastatin  80 mg Per Tube q1800  . bisacodyl  10 mg Oral Daily   Or  . bisacodyl  10 mg Rectal Daily  . carvedilol  3.125 mg Oral BID WC  . Chlorhexidine Gluconate Cloth  6 each Topical Daily  . Chlorhexidine Gluconate Cloth  6 each Topical Q0600  . Chlorhexidine Gluconate Cloth  6 each Topical Q0600  . citalopram  10 mg Per Tube Daily  . darbepoetin (ARANESP) injection - NON-DIALYSIS  100 mcg Subcutaneous Q Tue-1800  . feeding supplement (NEPRO CARB STEADY)  237 mL Oral BID BM  . feeding supplement (PRO-STAT SUGAR FREE 64)  30 mL Per Tube BID  . influenza vaccine adjuvanted  0.5 mL Intramuscular Tomorrow-1000  . insulin aspart  0-24 Units Subcutaneous Q4H  . insulin aspart  6 Units Subcutaneous Q4H  . insulin glargine  24 Units Subcutaneous BID  . multivitamin  1 tablet Oral QHS  . pantoprazole (PROTONIX) IV  40 mg Intravenous Daily  . sodium chloride flush  10-40 mL Intracatheter Q12H  . Warfarin - Pharmacist Dosing Inpatient   Does not apply q1600    Infusions: . sodium chloride Stopped (04/15/20 1731)  . feeding supplement (NEPRO CARB STEADY) 50 mL/hr at 04/26/20 1600    PRN Medications: sodium chloride, acetaminophen, dextrose, heparin sodium (porcine), hydrALAZINE, levalbuterol, ondansetron (ZOFRAN) IV, oxyCODONE, promethazine, sodium chloride flush, sorbitol, traMADol   Assessment/Plan:   1. Acute on chronic systolic HF -> cardiogenic shock-> S/p HM3 LVAD - Echo 2016 EF 30-35% - Echo 1/21 EF 25% - Admitted with NYHA IV symptoms and AKI with attempts at diuresis. Initial co-ox 39% - Echo this admit: EF 10-15% moderate RV dysfunction - R/LHC cath 3/21 with severe 3v CAD and low output with CI 1.7.   - S/p HM3 VAD 4/8  - Continue current dose of carvedilol 3.125 mg twice a day.   - Volume status stable. Managed by HD  2. VAD - VAD  interrogated personally. Parameters stable. - Had Pump Stop 04/03/20. Controller changed out with old controller sent back to [facility] for evaluation.  - on aspirin 81 mg daily + coumadin.  - LDH  pending . We have lowered INR goal to 1.8-2.3 given anemia. - INR today 1.7. Discussed dosing with PharmD personally.  3. CAD with unstable angina - s/p previous PCI. - cath 03/09/2020 with severe 3v CAD - now s/p VAD on 04/06/2020   - No s/s ischemia  4. AKI on CKD 3a -> ESRD - Nephrology consulted. Started CVVHD 4/13.    - Renal US 4/5 unremarkable.  - Hopefully renal function will recover but Renal team not optimistic - He is now off NE w/ stable MAP.  - Off CVVH.  1st iHD 5/3.  - Tunneled HD cath placed 5/5.  - Tolerating iHD. Will need permanent access at some point but given multiple issues including patient's lack of cooperation with care we are holding off. No change today. Will d/w Renal again in am.   5. Permanent AF - now s/p MAZE + LAA Clipping 4/8   - Continue Carvedilol 3.125 mg twice a day  - On coumadin. INR 1.7  6. MVP s/p MV repair - On admit with recurrent severe MR on echo and with huge v-waves on PCWP tracing - s/p VAD  7. Hemothoax - s/p CT placement and VATs on 4/22 - CTs pulled 4/26 -Resolved   8. Severe Protein Malnutrition  - Albumin 2.2 - Tube feeds ongoing.  - Speech following--> progressing diet. Still has cor-trak in to meet calorie needs. No change 9. CVA/anoxic brain injury - 03/27/20 CT large posterior MCA ischemic infarct, possibly from atrial fibrillation.  INR was supratherapeutic when CVA found.  Stable LDH, do not think partial pump thrombosis is the culprit. - Still with some L weakness and inattention but improving  - PT/OT/speech following.  - CIR consulted.   10. DM2, poorly controlled - hgbA1c 11.1% - continue insulin   11. Agitation/ Depression  - Celexa 10 mg daily added.  12. Deconditioning - Continue PT/OT. He seemed to work better  with PT yesterday per notes.   - CIR recommended but they have been following from distance given issues with non-cooperation    13. Anemia, post-op blood loss - hgb 7.1 no obvious bleeding - transfuse hgb < 7.5,  - will give 1u today. No obvious bleeding   14. Compliance - continues to be a major issue. I am not sure if his lability is related to his CVA or this is baseline. His stepdaughter is very involved and try to help.  - Will revisit with him and his stepdaughter with SW tomorrow.  - May benefit from formal neurocognitive eval if he will participate    Length of Stay: [place], MD  6:13 AM  04/27/2020

## 2020-04-27 NOTE — Progress Notes (Signed)
Patient ID: MATHAYUS STANBERY, male   DOB: Feb 23, 1951, 69 y.o.   MRN: [id number]    S: No acute events overnight.  Patient not willing to converse with me today.  Does not express any complaints.   O:BP (!) 114/91 (BP Location: Left Arm)   Pulse 80   Temp 98.5 F (36.9 C) (Oral)   Resp 13   Ht 6' (1.829 m)   Wt 96.7 kg   SpO2 93%   BMI 28.91 kg/m   Intake/Output Summary (Last 24 hours) at 04/27/2020 0954 Last data filed at 04/26/2020 1800 Gross per 24 hour  Intake 840 ml  Output 50 ml  Net 790 ml   Intake/Output: I/O last 3 completed shifts: In: 2580.5 [P.O.:240; I.V.:10; NG/GT:2330.5] Out: 225 [Urine:225]  Intake/Output this shift:  No intake/output data recorded. Weight change: -3.3 kg Gen: resting in bed in NAD CVS: mechanical hum Resp: cta, bl chest rise Abd: +BS, soft, nt/nd Ext: 1+ pitting edema in the left lower ankle, trace edema on the right lower extremity, warm extremeties  Recent Labs  Lab 04/21/20 0237 04/22/20 0254 04/23/20 0326 04/24/20 0831 04/25/20 0230 04/26/20 0259 04/27/20 0218  NA 134* 132* 134* 133* 133* 135 135  K 4.3 4.9 4.5 3.7 3.7 4.0 4.2  CL 94* 92* 94* 93* 96* 99 99  CO2 25 25 26 27 26 27 26   GLUCOSE 149* 143* 109* 77 160* 121* 130*  BUN 112* 141* 73* 39* 67* 36* 61*  CREATININE 7.14* 8.32* 5.59* 4.17* 5.52* 3.63* 5.14*  ALBUMIN 2.3* 2.3* 2.2* 2.4* 2.3* 2.3* 2.2*  CALCIUM 8.6* 8.6* 8.7* 8.5* 8.4* 8.3* 8.4*  PHOS 4.8* 6.3* 5.4* 4.5 6.1* 2.8 3.6   Liver Function Tests: Recent Labs  Lab 04/25/20 0230 04/26/20 0259 04/27/20 0218  ALBUMIN 2.3* 2.3* 2.2*   No results for input(s): LIPASE, AMYLASE in the last 168 hours. No results for input(s): AMMONIA in the last 168 hours. CBC: Recent Labs  Lab 04/23/20 1155 04/23/20 1155 04/24/20 0831 04/24/20 0831 04/25/20 0230 04/26/20 0259 04/27/20 0218  WBC 7.9   < > 7.5   < > 8.9 8.4 8.7  HGB 8.2*   < > 8.4*   < > 7.6* 7.4* 7.1*  HCT 25.9*   < > 27.1*   < > 24.8* 24.1* 23.0*   MCV 87.5  --  88.3  --  87.9 88.6 90.2  PLT 315   < > 316   < > 281 268 248   < > = values in this interval not displayed.   Cardiac Enzymes: No results for input(s): CKTOTAL, CKMB, CKMBINDEX, TROPONINI in the last 168 hours. CBG: Recent Labs  Lab 04/26/20 1625 04/26/20 2013 04/26/20 2354 04/27/20 0403 04/27/20 0812  GLUCAP 116* 137* 147* 122* 146*    Iron Studies: No results for input(s): IRON, TIBC, TRANSFERRIN, FERRITIN in the last 72 hours. Studies/Results: No results found. . sodium chloride   Intravenous Once  . sodium chloride   Intravenous Once  . aspirin  81 mg Per Tube Daily  . atorvastatin  80 mg Per Tube q1800  . bisacodyl  10 mg Oral Daily   Or  . bisacodyl  10 mg Rectal Daily  . carvedilol  3.125 mg Oral BID WC  . Chlorhexidine Gluconate Cloth  6 each Topical Daily  . Chlorhexidine Gluconate Cloth  6 each Topical Q0600  . Chlorhexidine Gluconate Cloth  6 each Topical Q0600  . citalopram  10 mg Per Tube Daily  .  darbepoetin (ARANESP) injection - NON-DIALYSIS  100 mcg Subcutaneous Q Tue-1800  . feeding supplement (NEPRO CARB STEADY)  237 mL Oral BID BM  . feeding supplement (PRO-STAT SUGAR FREE 64)  30 mL Per Tube BID  . influenza vaccine adjuvanted  0.5 mL Intramuscular Tomorrow-1000  . insulin aspart  0-24 Units Subcutaneous Q4H  . insulin aspart  6 Units Subcutaneous Q4H  . insulin glargine  24 Units Subcutaneous BID  . multivitamin  1 tablet Oral QHS  . pantoprazole (PROTONIX) IV  40 mg Intravenous Daily  . sodium chloride flush  10-40 mL Intracatheter Q12H  . Warfarin - Pharmacist Dosing Inpatient   Does not apply q1600    BMET    Component Value Date/Time   NA 135 04/27/2020 0218   NA 132 (L) 03/07/2020 1501   K 4.2 04/27/2020 0218   CL 99 04/27/2020 0218   CO2 26 04/27/2020 0218   GLUCOSE 130 (H) 04/27/2020 0218   BUN 61 (H) 04/27/2020 0218   BUN 16 03/07/2020 1501   CREATININE 5.14 (H) 04/27/2020 0218   CREATININE 1.29 (H) 12/10/2016 1011    CALCIUM 8.4 (L) 04/27/2020 0218   GFRNONAA 10 (L) 04/27/2020 0218   GFRNONAA 57 (L) 01/28/2015 1558   GFRAA 12 (L) 04/27/2020 0218   GFRAA 66 01/28/2015 1558   CBC    Component Value Date/Time   WBC 8.7 04/27/2020 0218   RBC 2.55 (L) 04/27/2020 0218   HGB 7.1 (L) 04/27/2020 0218   HGB 15.8 04/24/2018 1148   HCT 23.0 (L) 04/27/2020 0218   HCT 45.9 04/24/2018 1148   PLT 248 04/27/2020 0218   PLT 281 04/24/2018 1148   MCV 90.2 04/27/2020 0218   MCV 89 04/24/2018 1148   MCH 27.8 04/27/2020 0218   MCHC 30.9 04/27/2020 0218   RDW 15.9 (H) 04/27/2020 0218   RDW 14.2 04/24/2018 1148   LYMPHSABS 0.4 (L) 04/02/2020 0429   LYMPHSABS 1.1 04/24/2018 1148   MONOABS 2.2 (H) 04/02/2020 0429   EOSABS 0.0 04/02/2020 0429   EOSABS 0.2 04/24/2018 1148   BASOSABS 0.1 04/02/2020 0429   BASOSABS 0.1 04/24/2018 1148   Assessment/Plan:  1. AKI on CKD: BL Crt 1.5 on 03/07/20. AKI in setting of cardiogenic shock, pressors, s/p LVAD 04/09/2020. CRRT initiated on 03/25/20 and stopped 04/12/20.Transitioned to intermittent HD 04/14/20.  RRT dependent for >30 days unlikely to recover 1. Plan for HD  MWF-  Poor OP candidate due to somnolence, minimal cooperation, possible inability to sit up. Not participating in therapy. 2. No heparin with HD on systemic anticoagulation  2. Vascular access- RIJ TDC placed 04/16/20 by IR. 1. Obtain AVF/AVG when he is more stable/ready for discharge. Given issues as above may not be an outpt candidate unless he has significant progression.  3. sCHF s/p LVAD 03/15/2020 - cardiology managing. UF w/ HD 4. A fib s/p MAZE and LAA clipping 4/8 5. R MCA ischemic stroke- large, seen on CT on 03/27/20. Likely contributing to poor mental state 6. Left hemothorax s/p VATS 03/26/2020. 7. Anemia of CKD, multifactorial-s/p transfusion 04/14/20 and 5/7.Continue to follow, transfuse PRN.  and continue ESA. Iron studies likely not accurate given recent transfusion 8. Hyperphosphatemia (BMD):  Phosphorus improved with dialysis.  Awaiting PTH results. Calcium acceptable (corrects to normal accounting for albumin). 55. Fatigue/minimal conversation: Patient not actively participating in medical care making progression difficult.  We will continue to encourage.    Reesa Chew  [employer] [phone]

## 2020-04-27 NOTE — Progress Notes (Signed)
Patient appears to be angry and not wanting staff in the room. Requesting [place] to leave and trying to refuse vitals and other care. I reminded him that I would be here for the next twelve hours to provide care that was ordered by his doctor.Previous RN reported to me that patient was also angry and verbal with them. I tried to talk with patient about what was wrong but he was unreasonable and yelled. MD may may want to consider a more immediate medicine to help with patients angry outburst. I will continue to talk with patient to allow him express his feelings as he will allow.

## 2020-04-27 NOTE — Progress Notes (Signed)
Attempted to get pt out of bed to chair, pt not wanting to returned to bed will attempt again later.

## 2020-04-28 LAB — TYPE AND SCREEN
ABO/RH(D): O POS
Antibody Screen: NEGATIVE
Unit division: 0

## 2020-04-28 LAB — GLUCOSE, CAPILLARY
Glucose-Capillary: 117 mg/dL — ABNORMAL HIGH (ref 70–99)
Glucose-Capillary: 118 mg/dL — ABNORMAL HIGH (ref 70–99)
Glucose-Capillary: 128 mg/dL — ABNORMAL HIGH (ref 70–99)
Glucose-Capillary: 133 mg/dL — ABNORMAL HIGH (ref 70–99)
Glucose-Capillary: 139 mg/dL — ABNORMAL HIGH (ref 70–99)
Glucose-Capillary: 21 mg/dL — CL (ref 70–99)
Glucose-Capillary: 91 mg/dL (ref 70–99)

## 2020-04-28 LAB — RENAL FUNCTION PANEL
Albumin: 2.2 g/dL — ABNORMAL LOW (ref 3.5–5.0)
Anion gap: 11 (ref 5–15)
BUN: 90 mg/dL — ABNORMAL HIGH (ref 8–23)
CO2: 25 mmol/L (ref 22–32)
Calcium: 8.4 mg/dL — ABNORMAL LOW (ref 8.9–10.3)
Chloride: 96 mmol/L — ABNORMAL LOW (ref 98–111)
Creatinine, Ser: 6.5 mg/dL — ABNORMAL HIGH (ref 0.61–1.24)
GFR calc Af Amer: 9 mL/min — ABNORMAL LOW (ref >60)
GFR calc non Af Amer: 8 mL/min — ABNORMAL LOW (ref >60)
Glucose, Bld: 123 mg/dL — ABNORMAL HIGH (ref 70–99)
Phosphorus: 4 mg/dL (ref 2.5–4.6)
Potassium: 4.3 mmol/L (ref 3.5–5.1)
Sodium: 132 mmol/L — ABNORMAL LOW (ref 135–145)

## 2020-04-28 LAB — CBC
HCT: 24.9 % — ABNORMAL LOW (ref 39.0–52.0)
Hemoglobin: 7.9 g/dL — ABNORMAL LOW (ref 13.0–17.0)
MCH: 28 pg (ref 26.0–34.0)
MCHC: 31.7 g/dL (ref 30.0–36.0)
MCV: 88.3 fL (ref 80.0–100.0)
Platelets: 233 10*3/uL (ref 150–400)
RBC: 2.82 MIL/uL — ABNORMAL LOW (ref 4.22–5.81)
RDW: 15.8 % — ABNORMAL HIGH (ref 11.5–15.5)
WBC: 9.4 10*3/uL (ref 4.0–10.5)
nRBC: 0 % (ref 0.0–0.2)

## 2020-04-28 LAB — PROTIME-INR
INR: 1.7 — ABNORMAL HIGH (ref 0.8–1.2)
Prothrombin Time: 19.4 seconds — ABNORMAL HIGH (ref 11.4–15.2)

## 2020-04-28 LAB — LACTATE DEHYDROGENASE: LDH: 219 U/L — ABNORMAL HIGH (ref 98–192)

## 2020-04-28 LAB — BPAM RBC
Blood Product Expiration Date: 202106172359
ISSUE DATE / TIME: 202105160944
Unit Type and Rh: 5100

## 2020-04-28 LAB — MAGNESIUM: Magnesium: 2.5 mg/dL — ABNORMAL HIGH (ref 1.7–2.4)

## 2020-04-28 LAB — PARATHYROID HORMONE, INTACT (NO CA): PTH: 21 pg/mL (ref 15–65)

## 2020-04-28 MED ORDER — WARFARIN SODIUM 1 MG PO TABS
1.0000 mg | ORAL_TABLET | Freq: Once | ORAL | Status: AC
  Administered 2020-04-28: 1 mg via ORAL
  Filled 2020-04-28: qty 1

## 2020-04-28 NOTE — Progress Notes (Signed)
LVAD Coordinator Rounding Note:  HM III LVAD implanted on 03/28/2020 with MAZE procedure + LAA clipping  by Dr Orvan Seen under destination therapy criteria due to advanced age. Primary Heart Failure Cardiologist is Dr. Haroldine Laws.   Pt asleep in the bed this morning. Attempted to wake pt, pt refuses to interact with me. Staff tells me that pt was very angry overnight and is refusing to participate in his care.  Vital signs: Temp: 97.6 HR: 72 - afib Cuff: 104/85 (92) Doppler: 118  O2 Sat: 98% on RA Wt: 253.3>262.3>260.8>259.9>237.8.Marland Kitchen..217.5>224.8>218.7>221.7>216.7>220.4>214.5 lbs   LVAD interrogation reveals:  Speed: 5300 Flow: 3.9 Power: 3.8w  PI: 4.7  Alarms: none Events: none Hematocrit: 25  Fixed speed: 5300 Low speed limit: 5000   Drive Line:  Existing VAD dressing removed and site care performed using sterile technique. Drive line exit site cleaned with Chlora prep applicators x 2, allowed to dry, and gauze dressing with silver strip re-applied. Exit site healing and unincorporated, the velour is fully implanted at exit site. Scant amount of serous drainage. No redness, tenderness, foul odor or rash noted. Drive line anchor secure. Dressing changes twice weekly (Monday / Thursday) dressing changes per nurse champion or VAD coordinator.  Next dressing change due 04/28/20.      Labs:  LDH trend: 360>513>482>...241>253>224>225>215>222>238>231>219  INR trend: 1.3>6.2>9>4.4>6.2>2.2.Marland Kitchen..2.1>2.4>2.1>2.2>2.4>2.6>3.0>1.0>1.7  CR: 3.57>3.98>3.18>2.44...1.83>1.88>2.3>5.52>4.54>6.3>4.34>6.4>7.14>8.32>5.59>4.17>5.52>6.5   Anticoagulation Plan: -INR Goal: 1.8 - 2.3  -ASA Dose: 81 mg - will continue due to hx of CVA per Dr. Haroldine Laws  Respiratory: - extubated 4/9/2 - re-intubated 03/15/2020 s/p left hemothorax with cardiac arrest - extubated 04/06/20  Renal:  - CVVH started 03/25/20 - Intermittent HD dialysis started 04/14/20 - 04/16/20 Tunneled HD cath placed  Neuro: - Right MCA CVA on  03/27/20  Nitric Oxide: off 03/23/20  Blood Products:  Intraop: 6 FFP  DDAVP  420 cell saver  03/22/2020>>4 FFP 03/21/20>> 1 RBC 03/22/20>>1 RBC  Intra-op: 03/21/2020>>2 PCs      2 FFP Post-op: 04/07/2020>>3 PC's, 2 FFP 04/14/20>>1 PC's 04/15/20>> 1 PC's 04/18/20>>1 PC's 04/27/20>>1 PC's  Cultures:  -03/25/20- blood cultures>>negative - 04/07/2020-blood cultures>>negative - 04/03/20 - respiratory culture>>neg   Device: -St [name] -Therapies: VF 194 - therapy on  Drips: - Milrinone 0.125 mcg/kg/min >> off 04/09/20 - Levo 1 mcg/min>> off 04/10/20 - Amiodarone 30 mg/hr-off - Tube feed: 50 ml/hr  Adverse Events on VAD: >>03/25/20 CVVH Started >>03/17/2020 left hemothorax; cardiac arrest >>04/14/20 HD  Pt Education:  1. No VAD education with patient this am; he is refusing to engage and just wants "to be left alone".  2. No family at bedside.   Plan/Recommendations:  1. Call VAD coordinator for any equipment or drive line issues.  2. Dressing changes Monday / Thursday per nurse champion or VAD coordinator.   Tanda Rockers RN [place] Coordinator  Office: [phone]  24/7 Pager: [phone]

## 2020-04-28 NOTE — Progress Notes (Signed)
PT Cancellation Note  Patient Details Name: [name] [name] MRN: [id number] DOB: 07-Dec-1951   Cancelled Treatment:     Pt refused stating he was nauseous. Will continue attempts.    [place] 04/28/2020, 11:12 AM [place] Pager [phone] Office [phone]

## 2020-04-28 NOTE — Progress Notes (Signed)
CSW visited bedside although patient sound asleep. CSW did not disturb. [name], [place], [place]

## 2020-04-28 NOTE — Progress Notes (Signed)
Physical Therapy Treatment Patient Details Name: [name] [name] MRN: [id number] DOB: Jun 19, 1951 Today's Date: 04/28/2020    History of Present Illness 69 yo male with history of CAD with prior stenting of the LAD, Diagonal and OM in an outside hospital, ischemic cardiomyopathy with ICD in place, chronic systolic CHF, MVP s/p mitral valve repair, permanent atrial fib on chronic anticoagulation, HTN, HLD, DM, depression admitted to [facility] with progressive weakness, fatigue, dyspnea and chest pressure. Troponin elevated at 0.06. He was transferred to [facility] for cardiac cath, showing severe 3 vessel disease. Plan for CABG but pt with AKI. Pt underwent multiple tooth extraction on 4/6 and LVAD placement on 4/8. Postop day 7 patient was noted to be agitated the CT head was obtained showing a large posterior right MCA infarct. Pt with arrest on 4/20 and reintubated, extubated 4/25. Pt on CRRT until 04/12/20 when transitioned to HD.Tunnelled HD cath 5/5    PT Comments    Pt reluctant to engage in mobility/therapy. Sat EOB but unable to achieve standing with pt putting out minimal effort on these attempts.    Follow Up Recommendations  CIR;Supervision/Assistance - 24 hour     Equipment Recommendations  Other (comment)(to be determined)    Recommendations for Other Services       Precautions / Restrictions Precautions Precautions: Fall;Sternal Precaution Comments: pt with no carry over of precautions; cortrak    Mobility  Bed Mobility Overal bed mobility: Needs Assistance Bed Mobility: Supine to Sit;Sit to Supine     Supine to sit: +2 for physical assistance;Total assist;HOB elevated Sit to supine: +2 for physical assistance;Total assist   General bed mobility comments: Assist for all aspects   Transfers Overall transfer level: Needs assistance               General transfer comment: Attempted 3 times to stand with St[place]. Pt with minimal effort to assist therapist.  Unable to bring buttocks off of bed at all.   Ambulation/Gait                 Stairs             Wheelchair Mobility    Modified Rankin (Stroke Patients Only) Modified Rankin (Stroke Patients Only) Pre-Morbid Rankin Score: No symptoms Modified Rankin: Severe disability     Balance Overall balance assessment: Needs assistance Sitting-balance support: Bilateral upper extremity supported;Feet supported Sitting balance-Leahy Scale: Poor Sitting balance - Comments: Pt sat EOB x 15 minutes initially with mod assist and then improving to min guard/min assist Postural control: Left lateral lean                                  Cognition Arousal/Alertness: Lethargic(eyes closed much of session) Behavior During Therapy: Flat affect Overall Cognitive Status: Impaired/Different from baseline Area of Impairment: Orientation;Attention;Memory;Following commands;Safety/judgement;Awareness                 Orientation Level: Disoriented to;Place;Situation Current Attention Level: Focused Memory: Decreased short-term memory;Decreased recall of precautions Following Commands: Follows one step commands inconsistently;Follows one step commands with increased time Safety/Judgement: Decreased awareness of deficits Awareness: Intellectual Problem Solving: Slow processing;Decreased initiation;Difficulty sequencing;Requires verbal cues General Comments: Pt reluctant to participate.       Exercises      General Comments General comments (skin integrity, edema, etc.): VSS      Pertinent Vitals/Pain Pain Assessment: Faces Faces Pain Scale: Hurts little more Pain Location:  generalized with movement Pain Descriptors / Indicators: (yells out when trying to stand) Pain Intervention(s): Limited activity within patient's tolerance;Monitored during session    Home Living                      Prior Function            PT Goals (current goals can now  be found in the care plan section) Progress towards PT goals: Not progressing toward goals - comment    Frequency    Min 3X/week      PT Plan Current plan remains appropriate    Co-evaluation PT/OT/SLP Co-Evaluation/Treatment: Yes Reason for Co-Treatment: Complexity of the patient's impairments (multi-system involvement);For patient/therapist safety PT goals addressed during session: Mobility/safety with mobility;Balance        AM-PAC PT "6 Clicks" Mobility   Outcome Measure  Help needed turning from your back to your side while in a flat bed without using bedrails?: Total Help needed moving from lying on your back to sitting on the side of a flat bed without using bedrails?: Total Help needed moving to and from a bed to a chair (including a wheelchair)?: Total Help needed standing up from a chair using your arms (e.g., wheelchair or bedside chair)?: Total Help needed to walk in hospital room?: Total Help needed climbing 3-5 steps with a railing? : Total 6 Click Score: 6    End of Session   Activity Tolerance: Patient limited by lethargy;Other (comment)(self limiting) Patient left: in bed;with call bell/phone within reach;with bed alarm set   PT Visit Diagnosis: Muscle weakness (generalized) (M62.81);Other abnormalities of gait and mobility (R26.89);Other symptoms and signs involving the nervous system (R29.898)     Time: 8676-1950 PT Time Calculation (min) (ACUTE ONLY): 28 min  Charges:  $Therapeutic Activity: 8-22 mins                     [place] Pager [phone] Office [place] 04/28/2020, 3:31 PM

## 2020-04-28 NOTE — Progress Notes (Signed)
[facility] Progress Note    Assessment/ Plan:   1. AKI on CKD: BL Crt 1.5 on 03/07/20. AKI in setting of cardiogenic shock, pressors, s/p LVAD 04/03/2020. CRRT initiated on 03/25/20 and stopped 04/12/20.Transitioned to intermittent HD 04/14/20.  RRT dependent for >30 days unlikely to recover 1. Plan for HD  MWF-  Poor OP candidate due to somnolence, minimal cooperation, possible inability to sit up. Not participating in therapy. I spoke to him at length about outpt dialysis regimen TIW. He is non committal this am; will address daily and hopefully he continues some dialogue. 2. No heparin with HD on systemic anticoagulation  2. Vascular access- RIJ TDC placed 04/16/20 by IR. 1. Obtain AVF/AVG when he is more stable/ready for discharge if he desires long term HD. Given issues as above may not be an outpt candidate unless he has significant progression.  3. sCHF s/p LVAD 03/20/2020 - cardiology managing. UF w/ HD 4. A fib s/p MAZE and LAA clipping 4/8 5. R MCA ischemic stroke- large, seen on CT on 03/27/20. Likely contributing to poor mental state 6. Left hemothorax s/p VATS 04/07/2020. 7. Anemia of CKD, multifactorial-s/p transfusion 04/14/20 and 5/7.Continue to follow, transfuse PRN. and continue ESA. Iron studies likely not accurate given recent transfusion 8. Hyperphosphatemia (BMD): Phosphorus improved with dialysis (3.6).  Awaiting PTH results. Calcium acceptable (corrects to normal accounting for albumin).  24. Fatigue/minimal conversation: Patient not actively participating in medical care making progression difficult.  We will continue to encourage.  Subjective:   No acute events overnight.  Patient not willing to converse with me today.  Does not express any complaints.   Objective:   BP (!) 127/105   Pulse 64   Temp 98.4 F (36.9 C) (Oral)   Resp 13   Ht 6' (1.829 m)   Wt 97.3 kg   SpO2 98%   BMI 29.09 kg/m   Intake/Output Summary (Last 24 hours) at 04/28/2020 0947 Last  data filed at 04/28/2020 0513 Gross per 24 hour  Intake 2026.66 ml  Output 150 ml  Net 1876.66 ml   Weight change: 0.6 kg  Physical Exam: Gen: resting in bed in NAD CVS: mechanical hum Resp: cta, bl chest rise Abd: +BS, soft, nt/nd Ext: 1+ pitting edema in the left lower ankle, trace edema on the right lower extremity, warm extremeties Access: RIJ TC  Imaging: No results found.  Labs: BMET Recent Labs  Lab 04/22/20 0254 04/23/20 0326 04/24/20 0831 04/25/20 0230 04/26/20 0259 04/27/20 0218 04/28/20 0228  NA 132* 134* 133* 133* 135 135 132*  K 4.9 4.5 3.7 3.7 4.0 4.2 4.3  CL 92* 94* 93* 96* 99 99 96*  CO2 25 26 27 26 27 26 25   GLUCOSE 143* 109* 77 160* 121* 130* 123*  BUN 141* 73* 39* 67* 36* 61* 90*  CREATININE 8.32* 5.59* 4.17* 5.52* 3.63* 5.14* 6.50*  CALCIUM 8.6* 8.7* 8.5* 8.4* 8.3* 8.4* 8.4*  PHOS 6.3* 5.4* 4.5 6.1* 2.8 3.6 4.0   CBC Recent Labs  Lab 04/25/20 0230 04/26/20 0259 04/27/20 0218 04/28/20 0228  WBC 8.9 8.4 8.7 9.4  HGB 7.6* 7.4* 7.1* 7.9*  HCT 24.8* 24.1* 23.0* 24.9*  MCV 87.9 88.6 90.2 88.3  PLT 281 268 248 233    Medications:    . sodium chloride   Intravenous Once  . aspirin  81 mg Per Tube Daily  . atorvastatin  80 mg Per Tube q1800  . bisacodyl  10 mg Oral Daily  Or  . bisacodyl  10 mg Rectal Daily  . carvedilol  3.125 mg Oral BID WC  . Chlorhexidine Gluconate Cloth  6 each Topical Q0600  . citalopram  10 mg Per Tube Daily  . darbepoetin (ARANESP) injection - NON-DIALYSIS  100 mcg Subcutaneous Q Tue-1800  . feeding supplement (NEPRO CARB STEADY)  237 mL Oral BID BM  . feeding supplement (PRO-STAT SUGAR FREE 64)  30 mL Per Tube BID  . influenza vaccine adjuvanted  0.5 mL Intramuscular Tomorrow-1000  . insulin aspart  0-24 Units Subcutaneous Q4H  . insulin aspart  6 Units Subcutaneous Q4H  . insulin glargine  24 Units Subcutaneous BID  . multivitamin  1 tablet Oral QHS  . pantoprazole (PROTONIX) IV  40 mg Intravenous Daily  .  sodium chloride flush  10-40 mL Intracatheter Q12H  . Warfarin - Pharmacist Dosing Inpatient   Does not apply q1600      Otelia Santee, MD 04/28/2020, 9:47 AM

## 2020-04-28 NOTE — Progress Notes (Signed)
ANTICOAGULATION CONSULT NOTE  Pharmacy Consult for warfarin Indication: post op LVAD  / Aflutter  Allergies  Allergen Reactions  . Liraglutide Nausea And Vomiting  . Lisinopril Cough    Patient Measurements: Height: 6' (182.9 cm) Weight: 97.3 kg (214 lb 8.1 oz) IBW/kg (Calculated) : 77.6 Heparin Dosing Weight: 101.2 kg  Vital Signs: Temp: 97.6 F (36.4 C) (05/17 1027) Temp Source: Axillary (05/17 1027) BP: 104/85 (05/17 1027) Pulse Rate: 67 (05/17 1027)  Labs: Recent Labs    04/26/20 0259 04/26/20 0259 04/27/20 0218 04/28/20 0228  HGB 7.4*   < > 7.1* 7.9*  HCT 24.1*  --  23.0* 24.9*  PLT 268  --  248 233  LABPROT 20.7*  --  19.6* 19.4*  INR 1.9*  --  1.7* 1.7*  CREATININE 3.63*  --  5.14* 6.50*   < > = values in this interval not displayed.    Estimated Creatinine Clearance: 12.8 mL/min (A) (by C-G formula based on SCr of 6.5 mg/dL (H)).   Assessment: 61 YOM with atrial fibrillation on Eliquis PTA s/p R & L heart cath found to have 3v disease s/p  LVAD  Implant 4/8. Warfarin initiated on 4/9 after discussing with MD.  CT on 4/15 - found to have large right MCA stroke. INR goal reduced 5/4.  INR just slightly below goal range 3> 1.7 after holding x 2 doses last week- restarted  and pt having more po intake. hgb low yesterday > prbc slightly improved  No bleeding noted Goal of Therapy:  INR 1.8-2.3 Monitor platelets by anticoagulation protocol: Yes   Plan:  -Warfarin 1 mg x 1 tonight - repeat -Daily PT/INR   Bonnita Nasuti Pharm.D. CPP, BCPS Clinical Pharmacist [phone] 04/28/2020 10:46 AM

## 2020-04-28 NOTE — Progress Notes (Signed)
Nutrition Follow-up  DOCUMENTATION CODES:   Not applicable  INTERVENTION:   Continue tube feeds via Cortrak: - Nepro at 50 ml/hr (1200 ml/day) - Pro-Stat 30 ml BID  Tube feeding provides 2360 kcals, 127 g of protein and 876 mL of free water. Meets 100% estimated calorie and protein needs.  - Continue Nepro Shake po BID, each supplement provides 425 kcal and 19 grams protein  - Continue Magic cup TID with meals, each supplement provides 290 kcal and 9 grams of protein  - Continue renal MVI daily  NUTRITION DIAGNOSIS:   Increased nutrient needs related to post-op healing, chronic illness as evidenced by estimated needs.  Being addressed via tube feeding, supplements  GOAL:   Patient will meet greater than or equal to 90% of their needs  Met via TF  MONITOR:   PO intake, Supplement acceptance, Labs, Weight trends  REASON FOR ASSESSMENT:   Consult LVAD Eval  ASSESSMENT:   69 yo male admitted with acute on chronic CHF, AKI on CKD 3. Initial plan for CABG but with re-do sternotomy, poor targets and longstanding low EF plan for LVAD instead. PMH includes CAD, CKD, DM  3/31 - admit, cath with severe 3v CAD 4/03 - Swan placed  4/06 - teeth extraction 4/08 - HeartMate 3 LVAD placed, re-do sternotomy, intubated 4/09 - extubated 4/13 - CRRT initiated 4/14 - Cortrak placed, TF started 4/15 - head CT showing right MC CVA 4/19 - CRRT stopped, respiratory arrest requiring intubation 4/20 - hemothorax requiring emergent chest tube placement, VATS, CRRT re-initiated 4/21 - Cortrak advanced to proximal duodenum 4/25 - extubated 4/26 - Cortrak repositioned 5/01 - CRRT discontinued 5/03 - first iHD 5/05 - RIJ TDC placed 5/06 - diet advanced to dsyphagia 1, thins 5/08 - abd x-ray with Cortrak tip in 3rd portion of duodenum, no obstruction/ileus 5/10 - diet advanced to dysphagia 3, thins 5/11 - pt refusing tube feeds 5/12 - calorie count initiated 5/13 - pt agreeable to  restart tube feeds  Tube feeds currently infusing at goal rate of 50 ml/hr. Pt continues to have very minimal PO intake.  Noted pt refusing therapies this AM and refusing to interact with LVAD Coordinator.  Last HD on 04/25/20 with 1501 ml net UF. Post-HD weight was 99.5 kg. Per RN edema assessment, pt with mild pitting generalized edema and mild pitting edema to BLE.  Current weight: 97.3 kg Admit weight: 111.1 kg  Meal Completion: 0-10%  Medications reviewed and include: dulcolax, Nepro Shake BID, SSI q 4 hours, Novolog 6 units q 4 hours, Lantus 24 units BID, rena-vit, protonix, warfarin  Labs reviewed: sodium 132, magnesium 2.5, hemoglobin 7.9 CBG's: 91-185 x 24 hours  UOP: 150 ml x 24 hours  Diet Order:   Diet Order            DIET DYS 3 Room service appropriate? Yes; Fluid consistency: Thin  Diet effective now              EDUCATION NEEDS:   Education needs have been addressed  Skin:  Skin Assessment: Skin Integrity Issues: Incisions: sternum-healing  Last BM:  04/28/20 large type 6  Height:   Ht Readings from Last 1 Encounters:  03/29/2020 6' (1.829 m)    Weight:   Wt Readings from Last 1 Encounters:  04/28/20 97.3 kg    BMI:  Body mass index is 29.09 kg/m.  Estimated Nutritional Needs:   Kcal:  2300-2500 kcals  Protein:  120-140g  Fluid:  1000 mL  plus UOP    Gaynell Face, MS, RD, LDN Inpatient Clinical Dietitian Pager: [phone] Weekend/After Hours: [phone]

## 2020-04-28 NOTE — Progress Notes (Signed)
Occupational Therapy Treatment Patient Details Name: [name] [name] MRN: [id number] DOB: 1951/11/27 Today's Date: 04/28/2020    History of present illness 69 yo male with history of CAD with prior stenting of the LAD, Diagonal and OM in an outside hospital, ischemic cardiomyopathy with ICD in place, chronic systolic CHF, MVP s/p mitral valve repair, permanent atrial fib on chronic anticoagulation, HTN, HLD, DM, depression admitted to [facility] with progressive weakness, fatigue, dyspnea and chest pressure. Troponin elevated at 0.06. He was transferred to [facility] for cardiac cath, showing severe 3 vessel disease. Plan for CABG but pt with AKI. Pt underwent multiple tooth extraction on 4/6 and LVAD placement on 4/8. Postop day 7 patient was noted to be agitated the CT head was obtained showing a large posterior right MCA infarct. Pt with arrest on 4/20 and reintubated, extubated 4/25. Pt on CRRT until 04/12/20 when transitioned to HD.Tunnelled HD cath 5/5   OT comments  Pt without effort to assist with any mobility today. Asking to be left alone repeatedly. Supine<> sit required +2 total assist to sit EOB. Attempted to stand pt to transfer with Denna Haggard, but pt without any effort. Pt not participating in any ADL. May need to update discharge disposition next visit.  Follow Up Recommendations  CIR;Supervision/Assistance - 24 hour    Equipment Recommendations  None recommended by OT    Recommendations for Other Services      Precautions / Restrictions Precautions Precautions: Fall;Sternal Precaution Booklet Issued: No Precaution Comments: pt with no carry over of precautions or intiation of managment of equipment; cortrak       Mobility Bed Mobility Overal bed mobility: Needs Assistance Bed Mobility: Supine to Sit;Sit to Supine     Supine to sit: +2 for physical assistance;Total assist;HOB elevated Sit to supine: +2 for physical assistance;Total assist   General bed mobility  comments: Assist for all aspects, no initiation.  Transfers Overall transfer level: Needs assistance Equipment used: Ambulation equipment used Transfers: Sit to/from Stand           General transfer comment: Attempted 3 times to stand with Stedy. Pt with minimal effort to assist therapist. Unable to bring buttocks off of bed at all.     Balance Overall balance assessment: Needs assistance Sitting-balance support: Bilateral upper extremity supported;Feet supported Sitting balance-Leahy Scale: Poor Sitting balance - Comments: Pt sat EOB x 15 minutes initially with mod assist and then improving to min guard/min assist Postural control: Left lateral lean                                 ADL either performed or assessed with clinical judgement   ADL                                         General ADL Comments: pt not participating in any ADL today     Vision       Perception     Praxis      Cognition Arousal/Alertness: Lethargic Behavior During Therapy: Flat affect Overall Cognitive Status: Impaired/Different from baseline Area of Impairment: Orientation;Attention;Memory;Following commands;Safety/judgement;Awareness                 Orientation Level: Disoriented to;Place;Situation Current Attention Level: Focused Memory: Decreased short-term memory;Decreased recall of precautions Following Commands: Follows one step commands inconsistently;Follows one step commands  with increased time Safety/Judgement: Decreased awareness of deficits Awareness: Intellectual Problem Solving: Slow processing;Decreased initiation;Difficulty sequencing;Requires verbal cues General Comments: Pt reluctant to participate. Repeating, "Leave me alone!"        Exercises     Shoulder Instructions       General Comments VSS    Pertinent Vitals/ Pain       Pain Assessment: Faces Faces Pain Scale: Hurts little more Pain Location: generalized with  movement Pain Descriptors / Indicators: Moaning(yelling with standing) Pain Intervention(s): Repositioned  Home Living                                          Prior Functioning/Environment              Frequency  Min 2X/week        Progress Toward Goals  OT Goals(current goals can now be found in the care plan section)  Progress towards OT goals: Not progressing toward goals - comment  Acute Rehab OT Goals Patient Stated Goal: to be left alone OT Goal Formulation: With patient Time For Goal Achievement: 05/07/20 Potential to Achieve Goals: [place] Discharge plan remains appropriate    Co-evaluation    PT/OT/SLP Co-Evaluation/Treatment: Yes Reason for Co-Treatment: Complexity of the patient's impairments (multi-system involvement);For patient/therapist safety;Necessary to address cognition/behavior during functional activity PT goals addressed during session: Mobility/safety with mobility;Balance OT goals addressed during session: Strengthening/ROM      AM-PAC OT "6 Clicks" Daily Activity     Outcome Measure   Help from another person eating meals?: Total Help from another person taking care of personal grooming?: Total Help from another person toileting, which includes using toliet, bedpan, or urinal?: Total Help from another person bathing (including washing, rinsing, drying)?: Total Help from another person to put on and taking off regular upper body clothing?: Total Help from another person to put on and taking off regular lower body clothing?: Total 6 Click Score: 6    End of Session Equipment Utilized During Treatment: Gait belt  OT Visit Diagnosis: Cognitive communication deficit (R41.841);Unsteadiness on feet (R26.81);Muscle weakness (generalized) (M62.81);Pain Symptoms and signs involving cognitive functions: Cerebral infarction   Activity Tolerance Patient limited by lethargy(and decreased effort)   Patient Left in bed;with  call bell/phone within reach;with bed alarm set   Nurse Communication          Time: 8280-0349 OT Time Calculation (min): 34 min  Charges: OT General Charges $OT Visit: 1 Visit  Nestor Lewandowsky, OTR/L Acute Rehabilitation Services Pager: [phone] Office: [phone]   [name] 04/28/2020, 4:01 PM

## 2020-04-28 NOTE — Progress Notes (Addendum)
Patient ID: [name] [name], male   DOB: 07-29-1951, 69 y.o.   MRN: [id number]    Advanced Heart Failure Rounding Note   Subjective:    - S/p HM3 VAD 4/8 w MAZE procedure + LAA clipping. - Extubated 4/9 - 4/12 LVAD speed increased to 5400 --> echo moderate-severe RV dysfunction, septum mildly left shifted, trivial pericardial effusion.  LVAD speed cut back to 5300.  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  - 4/20 developed hemothorax requiring emergent CT placement and eventual VATS - 4/21 EEG done given concerns for posturing and c/w moderate to severe diffuse encephalopathy. No seizures or epileptiform discharges were seen  - 04/03/20 Pump Stop --> controller change out. CT of head unchanged R MCA CVA - 04/06/20 Extubated - 5/1 CRRT stopped. Had first iHD 5/3.  - 5/4 1uRBC - 5/6 1u RBC -5/16 1uRBC  Last night he had an angry outburst with staff. All medications put down the tube.   Not eating much.   VAD Interrogation HMIII  Flow 3.9  Speed 5300, Power 4 Pulse Index 5.5   Objective:   Weight Range:  Vital Signs:   Temp:  [97.7 F (36.5 C)-99.3 F (37.4 C)] 98.4 F (36.9 C) (05/17 0709) Pulse Rate:  [66-80] 66 (05/17 0709) Resp:  [12-16] 13 (05/17 0709) BP: (93-121)/(78-98) 111/95 (05/17 0709) SpO2:  [93 %-99 %] 98 % (05/17 0709) Weight:  [97.3 kg] 97.3 kg (05/17 0500) Last BM Date: 04/22/20  Weight change: Filed Weights   04/26/20 0333 04/27/20 0500 04/28/20 0500  Weight: 97.3 kg 96.7 kg 97.3 kg    Intake/Output:   Intake/Output Summary (Last 24 hours) at 04/28/2020 0724 Last data filed at 04/28/2020 0513 Gross per 24 hour  Intake 2026.66 ml  Output 150 ml  Net 1876.66 ml    MAP 80s    Physical Exam: GENERAL: No acute distress. HEENT: Cortrak   NECK: Supple, JVP  10-11 .  2+ bilaterally, no bruits.  No lymphadenopathy or thyromegaly appreciated.   CARDIAC:  Mechanical heart sounds with LVAD hum present.  LUNGS:  Clear to auscultation  bilaterally.  ABDOMEN:  Soft, round, nontender, positive bowel sounds x4.     LVAD exit site: Dressing dry and intact.  No erythema or drainage.  Stabilization device present and accurately applied.  Driveline dressing is being changed daily per sterile technique. EXTREMITIES:  Warm and dry, no cyanosis, clubbing, rash or edema  NEUROLOGIC:  Drowsy. Refused to open eyes. MAE x4.      Labs: Basic Metabolic Panel: Recent Labs  Lab 04/23/20 0326 04/23/20 0326 04/24/20 0831 04/24/20 0831 04/25/20 0230 04/26/20 0259 04/27/20 0218  NA 134*  --  133*  --  133* 135 135  K 4.5  --  3.7  --  3.7 4.0 4.2  CL 94*  --  93*  --  96* 99 99  CO2 26  --  27  --  26 27 26   GLUCOSE 109*  --  77  --  160* 121* 130*  BUN 73*  --  39*  --  67* 36* 61*  CREATININE 5.59*  --  4.17*  --  5.52* 3.63* 5.14*  CALCIUM 8.7*   < > 8.5*   < > 8.4* 8.3* 8.4*  MG 2.6*  --  2.2  --  2.3 2.1 2.3  PHOS 5.4*  --  4.5  --  6.1* 2.8 3.6   < > = values in this  interval not displayed.    Liver Function Tests: Recent Labs  Lab 04/23/20 0326 04/24/20 0831 04/25/20 0230 04/26/20 0259 04/27/20 0218  ALBUMIN 2.2* 2.4* 2.3* 2.3* 2.2*   No results for input(s): LIPASE, AMYLASE in the last 168 hours. No results for input(s): AMMONIA in the last 168 hours.  CBC: Recent Labs  Lab 04/24/20 0831 04/25/20 0230 04/26/20 0259 04/27/20 0218 04/28/20 0228  WBC 7.5 8.9 8.4 8.7 9.4  HGB 8.4* 7.6* 7.4* 7.1* 7.9*  HCT 27.1* 24.8* 24.1* 23.0* 24.9*  MCV 88.3 87.9 88.6 90.2 88.3  PLT 316 281 268 248 233    Cardiac Enzymes: No results for input(s): CKTOTAL, CKMB, CKMBINDEX, TROPONINI in the last 168 hours.  BNP: BNP (last 3 results) Recent Labs    04/02/20 2342 04/10/20 0132 04/17/20 0043  BNP 332.7* 267.2* 582.1*    ProBNP (last 3 results) No results for input(s): PROBNP in the last 8760 hours.    Other results:  Imaging: No results found.   Medications:     Scheduled Medications: . sodium  chloride   Intravenous Once  . aspirin  81 mg Per Tube Daily  . atorvastatin  80 mg Per Tube q1800  . bisacodyl  10 mg Oral Daily   Or  . bisacodyl  10 mg Rectal Daily  . carvedilol  3.125 mg Oral BID WC  . Chlorhexidine Gluconate Cloth  6 each Topical Q0600  . citalopram  10 mg Per Tube Daily  . darbepoetin (ARANESP) injection - NON-DIALYSIS  100 mcg Subcutaneous Q Tue-1800  . feeding supplement (NEPRO CARB STEADY)  237 mL Oral BID BM  . feeding supplement (PRO-STAT SUGAR FREE 64)  30 mL Per Tube BID  . influenza vaccine adjuvanted  0.5 mL Intramuscular Tomorrow-1000  . insulin aspart  0-24 Units Subcutaneous Q4H  . insulin aspart  6 Units Subcutaneous Q4H  . insulin glargine  24 Units Subcutaneous BID  . multivitamin  1 tablet Oral QHS  . pantoprazole (PROTONIX) IV  40 mg Intravenous Daily  . sodium chloride flush  10-40 mL Intracatheter Q12H  . Warfarin - Pharmacist Dosing Inpatient   Does not apply q1600    Infusions: . sodium chloride Stopped (04/15/20 1731)  . feeding supplement (NEPRO CARB STEADY) 1,000 mL (04/27/20 1213)    PRN Medications: sodium chloride, acetaminophen, dextrose, heparin sodium (porcine), hydrALAZINE, levalbuterol, ondansetron (ZOFRAN) IV, oxyCODONE, promethazine, sodium chloride flush, sorbitol, traMADol   Assessment/Plan:   1. Acute on chronic systolic HF -> cardiogenic shock-> S/p HM3 LVAD - Echo 2016 EF 30-35% - Echo 1/21 EF 25% - Admitted with NYHA IV symptoms and AKI with attempts at diuresis. Initial co-ox 39% - Echo this admit: EF 10-15% moderate RV dysfunction - R/LHC cath 3/21 with severe 3v CAD and low output with CI 1.7.   - S/p HM3 VAD 4/8  - Continue current dose of carvedilol 3.125 mg twice a day.   - Volume status trending up.    -Managed by HD  2. VAD - VAD interrogated personally. Parameters stable. - Had Pump Stop 04/03/20. Controller changed out with old controller sent back to [facility] for evaluation.  - on aspirin 81 mg  daily + coumadin.  - LDH pending.  . We have lowered INR goal to 1.8-2.3 given anemia. - INR today 1.7. Discussed dosing with PharmD personally.  3. CAD with unstable angina - s/p previous PCI. - cath 02/11/2020 with severe 3v CAD - now s/p VAD on 03/13/2020   - No  s/s ischemia  4. AKI on CKD 3a -> ESRD - Nephrology consulted. Started CVVHD 4/13.    - Renal US 4/5 unremarkable.  - Hopefully renal function will recover but Renal team not optimistic - Off CVVH.  1st iHD 5/3.  - Tunneled HD cath placed 5/5.  - Tolerating iHD. Will need permanent access at some point but given multiple issues including patient's lack of cooperation with care we are holding off.   5. Permanent AF - now s/p MAZE + LAA Clipping 4/8   - Rate controlled  -Continue Carvedilol 3.125 mg twice a day  - On coumadin. INR 1.7  6. MVP s/p MV repair - On admit with recurrent severe MR on echo and with huge v-waves on PCWP tracing - s/p VAD  7. Hemothoax - s/p CT placement and VATs on 4/22 - CTs pulled 4/26 -Resolved   8. Severe Protein Malnutrition  - Albumin 2.2 - Tube feeds ongoing.  - Speech following--> progressing diet. Still has cor-trak in to meet calorie needs. No change  9. CVA/anoxic brain injury - 03/27/20 CT large posterior MCA ischemic infarct, possibly from atrial fibrillation.  INR was supratherapeutic when CVA found.  Stable LDH, do not think partial pump thrombosis is the culprit. - Still with some L weakness and inattention but improving  - PT/OT/speech following.  - CIR consulted.   10. DM2, poorly controlled - hgbA1c 11.1% - continue insulin   11. Agitation/ Depression  - Celexa 10 mg daily added.  12. Deconditioning - Continue PT/OT. He seemed to work better with PT yesterday per notes.   - CIR recommended but they have been following from distance given issues with non-cooperation    13. Anemia, post-op blood loss - Received another unit of blood 04/27/20  Hgb up from 7.1>7.9  no obvious bleeding - transfuse hgb < 7.5,    14. Compliance - continues to be a major issue. I am not sure if his lability is related to his CVA or this is baseline. His stepdaughter is very involved and try to help.  - May benefit from formal neurocognitive eval if he will participate   Having angry outburst. Will follow up with SW. He needs boundaries set for ongoing care.    Length of Stay: [place], NP  7:24 AM  04/28/2020   Patient seen and examined with the above-signed Advanced Practice Provider and/or Housestaff. I personally reviewed laboratory data, imaging studies and relevant notes. I independently examined the patient and formulated the important aspects of the plan. I have edited the note to reflect any of my changes or salient points. I have personally discussed the plan with the patient and/or family.  Patient remains minimally interactive. Sat up on side of bed with PT for a few mins but session then cancelled as patient complained of ongoing nausea. Will not interact much with me. Denies CP or SOB.   Remains anuric.   General:  Lying in bed.  Minimally interactive.  HEENT: normal  + cor-trak Neck: supple. JVP not elevated.  Carotids 2+ bilat; no bruits. No lymphadenopathy or thryomegaly appreciated. Cor: LVAD hum.  Lungs: Clear. Abdomen: soft, nontender, non-distended. No hepatosplenomegaly. No bruits or masses. Good bowel sounds. Driveline site clean. Anchor in place.  Extremities: no cyanosis, clubbing, rash. Warm no edema  Neuro: will only answer a few questions. Grunts. Moves all 4  He remains unwilling to participate in meaningful way. Has not allowed any VAD teaching or performed rehab.  I am wondering if this is residual from stroke or more behavioral. (I suspect the former). Will ask Neuro to see again tomorrow. Remains anuric. Continue HD. VAD interrogated personally. Parameters stable. INR 1.7. Discussed dosing with PharmD personally.  Willa wait  Neuro input. With need for VAD care, ESRD and unwillingness to cooperate with providers placement will be almost impossible. We may be headed toward palliative situation with stopping of HD and deactivation of VAD.   Glori Bickers, MD  10:16 PM

## 2020-04-29 LAB — GLUCOSE, CAPILLARY
Glucose-Capillary: 113 mg/dL — ABNORMAL HIGH (ref 70–99)
Glucose-Capillary: 124 mg/dL — ABNORMAL HIGH (ref 70–99)
Glucose-Capillary: 139 mg/dL — ABNORMAL HIGH (ref 70–99)
Glucose-Capillary: 146 mg/dL — ABNORMAL HIGH (ref 70–99)
Glucose-Capillary: 155 mg/dL — ABNORMAL HIGH (ref 70–99)
Glucose-Capillary: 174 mg/dL — ABNORMAL HIGH (ref 70–99)
Glucose-Capillary: 177 mg/dL — ABNORMAL HIGH (ref 70–99)

## 2020-04-29 LAB — RENAL FUNCTION PANEL
Albumin: 2.2 g/dL — ABNORMAL LOW (ref 3.5–5.0)
Anion gap: 16 — ABNORMAL HIGH (ref 5–15)
BUN: 114 mg/dL — ABNORMAL HIGH (ref 8–23)
CO2: 23 mmol/L (ref 22–32)
Calcium: 8.4 mg/dL — ABNORMAL LOW (ref 8.9–10.3)
Chloride: 92 mmol/L — ABNORMAL LOW (ref 98–111)
Creatinine, Ser: 7.41 mg/dL — ABNORMAL HIGH (ref 0.61–1.24)
GFR calc Af Amer: 8 mL/min — ABNORMAL LOW (ref >60)
GFR calc non Af Amer: 7 mL/min — ABNORMAL LOW (ref >60)
Glucose, Bld: 128 mg/dL — ABNORMAL HIGH (ref 70–99)
Phosphorus: 5.1 mg/dL — ABNORMAL HIGH (ref 2.5–4.6)
Potassium: 4.6 mmol/L (ref 3.5–5.1)
Sodium: 131 mmol/L — ABNORMAL LOW (ref 135–145)

## 2020-04-29 LAB — CBC
HCT: 25.6 % — ABNORMAL LOW (ref 39.0–52.0)
Hemoglobin: 8.1 g/dL — ABNORMAL LOW (ref 13.0–17.0)
MCH: 28.2 pg (ref 26.0–34.0)
MCHC: 31.6 g/dL (ref 30.0–36.0)
MCV: 89.2 fL (ref 80.0–100.0)
Platelets: 214 K/uL (ref 150–400)
RBC: 2.87 MIL/uL — ABNORMAL LOW (ref 4.22–5.81)
RDW: 15.6 % — ABNORMAL HIGH (ref 11.5–15.5)
WBC: 9.2 K/uL (ref 4.0–10.5)
nRBC: 0 % (ref 0.0–0.2)

## 2020-04-29 LAB — LACTATE DEHYDROGENASE: LDH: 227 U/L — ABNORMAL HIGH (ref 98–192)

## 2020-04-29 LAB — MAGNESIUM: Magnesium: 2.7 mg/dL — ABNORMAL HIGH (ref 1.7–2.4)

## 2020-04-29 LAB — PROTIME-INR
INR: 1.9 — ABNORMAL HIGH (ref 0.8–1.2)
Prothrombin Time: 21.2 seconds — ABNORMAL HIGH (ref 11.4–15.2)

## 2020-04-29 MED ORDER — ALTEPLASE 2 MG IJ SOLR: 2.0000 mg | Freq: Once | INTRAMUSCULAR | Status: DC | PRN

## 2020-04-29 MED ORDER — SODIUM CHLORIDE 0.9 % IV SOLN: 100.0000 mL | INTRAVENOUS | Status: DC | PRN

## 2020-04-29 MED ORDER — DARBEPOETIN ALFA 100 MCG/0.5ML IJ SOSY
PREFILLED_SYRINGE | INTRAMUSCULAR | Status: AC
  Administered 2020-04-29: 100 ug via SUBCUTANEOUS
  Filled 2020-04-29: qty 0.5

## 2020-04-29 MED ORDER — WARFARIN 0.5 MG HALF TABLET
0.5000 mg | ORAL_TABLET | Freq: Once | ORAL | Status: AC
  Administered 2020-04-29: 0.5 mg via ORAL
  Filled 2020-04-29: qty 1

## 2020-04-29 MED ORDER — HEPARIN SODIUM (PORCINE) 1000 UNIT/ML IJ SOLN
INTRAMUSCULAR | Status: AC
  Administered 2020-04-29: 3200 [IU] via INTRAVENOUS
  Filled 2020-04-29: qty 4

## 2020-04-29 MED ORDER — HEPARIN SODIUM (PORCINE) 1000 UNIT/ML DIALYSIS: 1000.0000 [IU] | INTRAMUSCULAR | Status: DC | PRN

## 2020-04-29 MED ORDER — PENTAFLUOROPROP-TETRAFLUOROETH EX AERO: 1.0000 "application " | INHALATION_SPRAY | CUTANEOUS | Status: DC | PRN

## 2020-04-29 MED ORDER — LIDOCAINE HCL (PF) 1 % IJ SOLN: 5.0000 mL | INTRAMUSCULAR | Status: DC | PRN

## 2020-04-29 MED ORDER — PANTOPRAZOLE SODIUM 40 MG PO PACK
40.0000 mg | PACK | Freq: Every day | ORAL | Status: DC
  Administered 2020-04-30 – 2020-05-02 (×3): 40 mg
  Filled 2020-04-29 (×3): qty 20

## 2020-04-29 MED ORDER — LIDOCAINE-PRILOCAINE 2.5-2.5 % EX CREA: 1.0000 "application " | TOPICAL_CREAM | CUTANEOUS | Status: DC | PRN

## 2020-04-29 NOTE — Progress Notes (Addendum)
Patient ID: [name] [name], male   DOB: 26-May-1951, 69 y.o.   MRN: [id number]    Advanced Heart Failure Rounding Note   Subjective:    - S/p HM3 VAD 4/8 w MAZE procedure + LAA clipping. - Extubated 4/9 - 4/12 LVAD speed increased to 5400 --> echo moderate-severe RV dysfunction, septum mildly left shifted, trivial pericardial effusion.  LVAD speed cut back to 5300.  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  - 4/20 developed hemothorax requiring emergent CT placement and eventual VATS - 4/21 EEG done given concerns for posturing and c/w moderate to severe diffuse encephalopathy. No seizures or epileptiform discharges were seen  - 04/03/20 Pump Stop --> controller change out. CT of head unchanged R MCA CVA - 04/06/20 Extubated - 5/1 CRRT stopped. Had first iHD 5/3.  - 5/4 1uRBC - 5/6 1u RBC -5/16 1uRBC  Refusing to take medications. All medications are being crush and placed down tube. Making ~ 200 cc urine.   Says he doesn't care about getting out of bed because he is going to die anyway.   VAD Interrogation HMIII  Flow 4 Speed 5300, Power 4 Pulse Index 4.8    Objective:   Weight Range:  Vital Signs:   Temp:  [97.4 F (36.3 C)-98.5 F (36.9 C)] 98.5 F (36.9 C) (05/18 0421) Pulse Rate:  [61-69] 61 (05/18 0421) Resp:  [11-16] 12 (05/18 0421) BP: (103-129)/(75-107) 103/75 (05/18 0421) SpO2:  [94 %-98 %] 97 % (05/18 0421) Weight:  [101 kg] 101 kg (05/18 0421) Last BM Date: 04/27/20(per shift nurse; RN did not witness)  Weight change: Filed Weights   04/27/20 0500 04/28/20 0500 04/29/20 0421  Weight: 96.7 kg 97.3 kg 101 kg    Intake/Output:   Intake/Output Summary (Last 24 hours) at 04/29/2020 0746 Last data filed at 04/29/2020 0600 Gross per 24 hour  Intake 50 ml  Output 250 ml  Net -200 ml    MAP 100s     Physical Exam: GENERAL:No acute distress. HEENT: Cortrak NECK: Supple, JVP 7-8   .  2+ bilaterally, no bruits.  No lymphadenopathy or  thyromegaly appreciated.   CARDIAC:  Mechanical heart sounds with LVAD hum present.  LUNGS:  Clear to auscultation bilaterally.  ABDOMEN:  Soft, round, nontender, positive bowel sounds x4.     LVAD exit site:   Dressing dry and intact. Stabilization device present and accurately applied.   EXTREMITIES:  Warm and dry, no cyanosis, clubbing, rash or edema  NEUROLOGIC:  Alert and oriented x 3.  Gait steady.  No aphasia.  No dysarthria.  Affect flat    Labs: Basic Metabolic Panel: Recent Labs  Lab 04/25/20 0230 04/25/20 0230 04/26/20 0259 04/26/20 0259 04/27/20 0218 04/28/20 0228 04/29/20 0250  NA 133*  --  135  --  135 132* 131*  K 3.7  --  4.0  --  4.2 4.3 4.6  CL 96*  --  99  --  99 96* 92*  CO2 26  --  27  --  26 25 23   GLUCOSE 160*  --  121*  --  130* 123* 128*  BUN 67*  --  36*  --  61* 90* 114*  CREATININE 5.52*  --  3.63*  --  5.14* 6.50* 7.41*  CALCIUM 8.4*   < > 8.3*   < > 8.4* 8.4* 8.4*  MG 2.3  --  2.1  --  2.3 2.5* 2.7*  PHOS 6.1*  --  2.8  --  3.6 4.0 5.1*   < > = values in this interval not displayed.    Liver Function Tests: Recent Labs  Lab 04/25/20 0230 04/26/20 0259 04/27/20 0218 04/28/20 0228 04/29/20 0250  ALBUMIN 2.3* 2.3* 2.2* 2.2* 2.2*   No results for input(s): LIPASE, AMYLASE in the last 168 hours. No results for input(s): AMMONIA in the last 168 hours.  CBC: Recent Labs  Lab 04/24/20 0831 04/25/20 0230 04/26/20 0259 04/27/20 0218 04/28/20 0228  WBC 7.5 8.9 8.4 8.7 9.4  HGB 8.4* 7.6* 7.4* 7.1* 7.9*  HCT 27.1* 24.8* 24.1* 23.0* 24.9*  MCV 88.3 87.9 88.6 90.2 88.3  PLT 316 281 268 248 233    Cardiac Enzymes: No results for input(s): CKTOTAL, CKMB, CKMBINDEX, TROPONINI in the last 168 hours.  BNP: BNP (last 3 results) Recent Labs    04/02/20 2342 04/10/20 0132 04/17/20 0043  BNP 332.7* 267.2* 582.1*    ProBNP (last 3 results) No results for input(s): PROBNP in the last 8760 hours.    Other results:  Imaging: No  results found.   Medications:     Scheduled Medications: . sodium chloride   Intravenous Once  . aspirin  81 mg Per Tube Daily  . atorvastatin  80 mg Per Tube q1800  . bisacodyl  10 mg Oral Daily   Or  . bisacodyl  10 mg Rectal Daily  . carvedilol  3.125 mg Oral BID WC  . Chlorhexidine Gluconate Cloth  6 each Topical Q0600  . citalopram  10 mg Per Tube Daily  . darbepoetin (ARANESP) injection - NON-DIALYSIS  100 mcg Subcutaneous Q Tue-1800  . feeding supplement (NEPRO CARB STEADY)  237 mL Oral BID BM  . feeding supplement (PRO-STAT SUGAR FREE 64)  30 mL Per Tube BID  . influenza vaccine adjuvanted  0.5 mL Intramuscular Tomorrow-1000  . insulin aspart  0-24 Units Subcutaneous Q4H  . insulin aspart  6 Units Subcutaneous Q4H  . insulin glargine  24 Units Subcutaneous BID  . multivitamin  1 tablet Oral QHS  . pantoprazole (PROTONIX) IV  40 mg Intravenous Daily  . sodium chloride flush  10-40 mL Intracatheter Q12H  . Warfarin - Pharmacist Dosing Inpatient   Does not apply q1600    Infusions: . sodium chloride Stopped (04/15/20 1731)  . feeding supplement (NEPRO CARB STEADY) 1,000 mL (04/28/20 1223)    PRN Medications: sodium chloride, acetaminophen, dextrose, heparin sodium (porcine), hydrALAZINE, levalbuterol, ondansetron (ZOFRAN) IV, oxyCODONE, promethazine, sodium chloride flush, sorbitol, traMADol   Assessment/Plan:   1. Acute on chronic systolic HF -> cardiogenic shock-> S/p HM3 LVAD - Echo 2016 EF 30-35% - Echo 1/21 EF 25% - Admitted with NYHA IV symptoms and AKI with attempts at diuresis. Initial co-ox 39% - Echo this admit: EF 10-15% moderate RV dysfunction - R/LHC cath 3/21 with severe 3v CAD and low output with CI 1.7.   - S/p HM3 VAD 4/8  - Continue current dose of carvedilol 3.125 mg twice a day.  Maps elevated but getting HD today.  - Volume status managed per HD. HD today.    2. VAD - VAD interrogated personally. Parameters stable. - Had Pump Stop 04/03/20.  Controller changed out with old controller sent back to [facility] for evaluation.  - on aspirin 81 mg daily + coumadin.  - LDH stable.  . We have lowered INR goal to 1.8-2.3 given anemia. - INR today 1.9 today. Discussed dosing with PharmD personally.  3. CAD with unstable angina -  s/p previous PCI. - cath 02/23/2020 with severe 3v CAD - now s/p VAD on 04/06/2020   - No s/s ischemia  4. AKI on CKD 3a -> ESRD - Nephrology consulted. Started CVVHD 4/13.    - Renal US 4/5 unremarkable.  - Hopefully renal function will recover but Renal team not optimistic - Off CVVH.  1st iHD 5/3.  - Tunneled HD cath placed 5/5.  - Tolerating iHD. Will need permanent access at some point but given multiple issues including patient's lack of cooperation with care we are holding off.  - Making ~200 cc of urine.   5. Permanent AF - now s/p MAZE + LAA Clipping 4/8   - Rate controlled.  -Continue Carvedilol 3.125 mg twice a day  - On coumadin. INR 1.9   6. MVP s/p MV repair - On admit with recurrent severe MR on echo and with huge v-waves on PCWP tracing - s/p VAD  7. Hemothoax - s/p CT placement and VATs on 4/22 - CTs pulled 4/26 -Resolved   8. Severe Protein Malnutrition  - Albumin 2.2 - Tube feeds ongoing. He is refusing to eat.  - Speech following--> progressing diet. Still has cor-trak in to meet calorie needs. No change  9. CVA/anoxic brain injury - 03/27/20 CT large posterior MCA ischemic infarct, possibly from atrial fibrillation.  INR was supratherapeutic when CVA found.  Stable LDH, do not think partial pump thrombosis is the culprit. - Still with some L weakness and inattention but improving  - PT/OT/speech following.  - CIR consulted.   10. DM2, poorly controlled - hgbA1c 11.1% - continue insulin   11. Agitation/ Depression  - Celexa 10 mg daily added.  12. Deconditioning - Continue PT/OT. He seemed to work better with PT yesterday per notes.   - CIR recommended but they have been  following from distance given issues with non-cooperation    13. Anemia, post-op blood loss - Received another unit of blood 04/27/20  Hgb up from 7.1>7.9 no obvious bleeding - transfuse hgb < 7.5, no CBC today.  - Check CBC.    14. Compliance - continues to be a major issue. I am not sure if his lability is related to his CVA or this is baseline. His stepdaughter is very involved and try to help.  - May benefit from formal neurocognitive eval if he will participate  Needs to work with PT and improve oral intake.   Length of Stay: [place], NP  7:46 AM  04/29/2020   Patient seen and examined with the above-signed Advanced Practice Provider and/or Housestaff. I personally reviewed laboratory data, imaging studies and relevant notes. I independently examined the patient and formulated the important aspects of the plan. I have edited the note to reflect any of my changes or salient points. I have personally discussed the plan with the patient and/or family.  Minimally interactive. Refusing meds and other therapies. Says he doesn't care.   General: Minimally interactive. Won't open eyes  HEENT: normal  Neck: supple. JVP not elevated.  Carotids 2+ bilat; no bruits. No lymphadenopathy or thryomegaly appreciated. Cor: LVAD hum.  Lungs: Clear. Abdomen: soft, nontender, non-distended. No hepatosplenomegaly. No bruits or masses. Good bowel sounds. Driveline site clean. Anchor in place.  Extremities: no cyanosis, clubbing, rash. Warm no edema  Neuro: mores all 4. Won't participate  VAD interrogated personally. Parameters stable.  Patient continuing to refuse all therapies. Long discussion with SW team who knows patient and family well.  He has clearly stated several times that he would not want to live in a nursing home or burden others. Prior to his surgery he was clear to numerous providers that his stepdaughter should make decisions for him if he can't. Said he did not want his son  making decisions for him.   We d/w stepdaughter and I think we all agree that continuing aggressive therapy is not in his best interest or something that he would want as he clearly cannot care for himself and would not be eligible (or want) SNF in setting of VAD and ESRD with need for HD.   I think the only option is to stop HD and switch to comfort care. When he reaches a terminal state would then deactivate VAD. Will involve Palliative Care to help with final decision making but I do not see any alternative unfortunately.   Glori Bickers, MD  8:11 PM

## 2020-04-29 NOTE — Progress Notes (Signed)
RN removed pacer wires per order. Patient tolerate well. Vitals being checked Q15 x 4. RN will continue to monitor.

## 2020-04-29 NOTE — Progress Notes (Signed)
ANTICOAGULATION CONSULT NOTE  Pharmacy Consult for warfarin Indication: post op LVAD  / Aflutter  Allergies  Allergen Reactions  . Liraglutide Nausea And Vomiting  . Lisinopril Cough    Patient Measurements: Height: 6' (182.9 cm) Weight: 98.5 kg (217 lb 2.5 oz) IBW/kg (Calculated) : 77.6 Heparin Dosing Weight: 101.2 kg  Vital Signs: Temp: 98.1 F (36.7 C) (05/18 1314) Temp Source: Oral (05/18 1314) BP: 126/102 (05/18 1314) Pulse Rate: 85 (05/18 1314)  Labs: Recent Labs    04/27/20 0218 04/27/20 0218 04/28/20 0228 04/29/20 0250 04/29/20 0818  HGB 7.1*   < > 7.9*  --  8.1*  HCT 23.0*  --  24.9*  --  25.6*  PLT 248  --  233  --  214  LABPROT 19.6*  --  19.4* 21.2*  --   INR 1.7*  --  1.7* 1.9*  --   CREATININE 5.14*  --  6.50* 7.41*  --    < > = values in this interval not displayed.    Estimated Creatinine Clearance: 11.3 mL/min (A) (by C-G formula based on SCr of 7.41 mg/dL (H)).   Assessment: 79 YOM with atrial fibrillation on Eliquis PTA s/p R & L heart cath found to have 3v disease s/p  LVAD  Implant 4/8. Warfarin initiated on 4/9 after discussing with MD.  CT on 4/15 - found to have large right MCA stroke. INR goal reduced 5/4 - [id number]  INR 1.9 at goal  hgb low yesterday > prbc > improved  No bleeding noted  Goal of Therapy:  INR 1.8-2.3 Monitor platelets by anticoagulation protocol: Yes   Plan:  -Warfarin 0.5 mg x 1 tonight  -Daily PT/INR   Bonnita Nasuti Pharm.D. CPP, BCPS Clinical Pharmacist [phone] 04/29/2020 3:29 PM

## 2020-04-29 NOTE — Progress Notes (Signed)
[facility] Progress Note     Assessment/ Plan:   1. AKI on CKD: BL Crt 1.5 on 03/07/20. AKI in setting of cardiogenic shock, pressors, s/p LVAD 04/04/2020. CRRT initiated on 03/25/20 and stopped 04/12/20.Transitioned to intermittent HD 04/14/20. RRT dependent for >30 days unlikely to recover 1. Plan for HD MWF- Poor OP candidate due to somnolence, minimal cooperation, possible inability to sit up. Not participating in therapy. I spoke to him at length about outpt dialysis regimen TIW. He is non committal this am; will address daily and hopefully he continues some dialogue. 2. No heparin with HD on systemic anticoagulation 3. Seen on HD and likely ESRD now. 3K bath and goal 2.5 L net UF.  2. Vascular access- RIJ TDC placed 04/16/20 by IR. 1. Obtain AVF/AVG when he is more stable/ready for discharge if he desires long term HD. Given issues as above may not be an outpt candidate unless he has significant progression.  3. sCHF s/p LVAD 03/19/2020 - cardiology managing. UF w/ HD 4. A fib s/p MAZE and LAA clipping 4/8 5. R MCA ischemic stroke- large, seen on CT on 03/27/20. Likely contributing to poor mental state 6. Left hemothorax s/p VATS 04/04/2020. 7. Anemia of CKD, multifactorial-s/p transfusion 04/14/20 and 5/7.Continue to follow, transfuse PRN.and continue ESA. Iron studies likely not accurate given recent transfusion 8. Hyperphosphatemia (BMD):Phosphorus improved with dialysis (3.6).Awaiting PTH results. Calcium acceptable (corrects to normal accounting for albumin). P5.1 (5/18) 9. Fatigue/minimal conversation:   Subjective:   Patient not actively participating in medical care making progression difficult. We will continue to encourage. Tolerating HD this am.    Objective:   BP 93/76   Pulse (!) 57   Temp 97.7 F (36.5 C) (Oral)   Resp 16   Ht 6' (1.829 m)   Wt 98.5 kg   SpO2 98%   BMI 29.45 kg/m   Intake/Output Summary (Last 24 hours) at 04/29/2020 1003 Last data  filed at 04/29/2020 0600 Gross per 24 hour  Intake 0 ml  Output 250 ml  Net -250 ml   Weight change: 3.7 kg  Physical Exam: Gen: resting in bed in NAD CVS: mechanical hum Resp: cta, bl chest rise Abd: +BS, soft, nt/nd Ext:1+ pitting edema in the left lower ankle, trace edema on the right lower extremity, warm extremeties Access: RIJ TC   Imaging: No results found.  Labs: BMET Recent Labs  Lab 04/23/20 0326 04/24/20 0831 04/25/20 0230 04/26/20 0259 04/27/20 0218 04/28/20 0228 04/29/20 0250  NA 134* 133* 133* 135 135 132* 131*  K 4.5 3.7 3.7 4.0 4.2 4.3 4.6  CL 94* 93* 96* 99 99 96* 92*  CO2 26 27 26 27 26 25 23   GLUCOSE 109* 77 160* 121* 130* 123* 128*  BUN 73* 39* 67* 36* 61* 90* 114*  CREATININE 5.59* 4.17* 5.52* 3.63* 5.14* 6.50* 7.41*  CALCIUM 8.7* 8.5* 8.4* 8.3* 8.4* 8.4* 8.4*  PHOS 5.4* 4.5 6.1* 2.8 3.6 4.0 5.1*   CBC Recent Labs  Lab 04/26/20 0259 04/27/20 0218 04/28/20 0228 04/29/20 0818  WBC 8.4 8.7 9.4 9.2  HGB 7.4* 7.1* 7.9* 8.1*  HCT 24.1* 23.0* 24.9* 25.6*  MCV 88.6 90.2 88.3 89.2  PLT 268 248 233 214    Medications:    . sodium chloride   Intravenous Once  . aspirin  81 mg Per Tube Daily  . atorvastatin  80 mg Per Tube q1800  . bisacodyl  10 mg Oral Daily   Or  .  bisacodyl  10 mg Rectal Daily  . carvedilol  3.125 mg Oral BID WC  . Chlorhexidine Gluconate Cloth  6 each Topical Q0600  . citalopram  10 mg Per Tube Daily  . darbepoetin (ARANESP) injection - NON-DIALYSIS  100 mcg Subcutaneous Q Tue-1800  . feeding supplement (NEPRO CARB STEADY)  237 mL Oral BID BM  . feeding supplement (PRO-STAT SUGAR FREE 64)  30 mL Per Tube BID  . influenza vaccine adjuvanted  0.5 mL Intramuscular Tomorrow-1000  . insulin aspart  0-24 Units Subcutaneous Q4H  . insulin aspart  6 Units Subcutaneous Q4H  . insulin glargine  24 Units Subcutaneous BID  . multivitamin  1 tablet Oral QHS  . pantoprazole (PROTONIX) IV  40 mg Intravenous Daily  . sodium  chloride flush  10-40 mL Intracatheter Q12H  . Warfarin - Pharmacist Dosing Inpatient   Does not apply q1600      Otelia Santee, MD 04/29/2020, 10:03 AM

## 2020-04-29 NOTE — Progress Notes (Signed)
SLP Cancellation Note  Patient Details Name: [name] [name] MRN: [id number] DOB: 12-01-1951   Cancelled treatment:       Reason Eval/Treat Not Completed: Patient at procedure or test/unavailable. Pt currently in dialysis. Unable to assess diet tolerance and continue education at this time. Will continue efforts.  Tanesia Butner B. Quentin Ore, [facility], [place] Speech Language Pathologist Office: [phone] Pager: [phone]  Shonna Chock 04/29/2020, 8:49 AM

## 2020-04-29 NOTE — Progress Notes (Signed)
LVAD Coordinator Rounding Note:  HM III LVAD implanted on 04/11/2020 with MAZE procedure + LAA clipping  by Dr Orvan Seen under destination therapy criteria due to advanced age. Primary Heart Failure Cardiologist is Dr. Haroldine Laws.   Met patient in HD this morning. Patient resting in bed. Briefly interacted with me, answering simple questions.   Vital signs: Temp: 97.7 HR: 72 - afib Cuff: 104/85 (92) Doppler: 118  O2 Sat: 98% on RA Wt: 253.3>262.3>260.8>259.9>237.8.Marland Kitchen..217.5>224.8>218.7>221.7>216.7>220.4>214.5 lbs   LVAD interrogation reveals:  Speed: 5300 Flow: 3.9 Power: 3.8w  PI: 4.7  Alarms: none Events: none Hematocrit: 25  Fixed speed: 5300 Low speed limit: 5000   Drive Line:  Existing VAD dressing clean, dry, intact.  Drive line anchor secure. Dressing changes twice weekly (Monday / Thursday) dressing changes per nurse champion or VAD coordinator.  Next dressing change due 04/28/20.  Labs:  LDH trend: 360>513>482>...241>253>224>225>215>222>238>231>219>227   INR trend: 1.3>6.2>9>4.4>6.2>2.2.Marland Kitchen..2.1>2.4>2.1>2.2>2.4>2.6>3.0>1.0>1.7>1.9  CR: 3.57>3.98>3.18>2.44...1.83>1.88>2.3>5.52>4.54>6.3>4.34>6.4>7.14>8.32>5.59>4.17>5.52>6.5>7.41    Anticoagulation Plan: -INR Goal: 1.8 - 2.3  -ASA Dose: 81 mg - will continue due to hx of CVA per Dr. Haroldine Laws  Respiratory: - extubated 4/9/2 - re-intubated 04/04/2020 s/p left hemothorax with cardiac arrest - extubated 04/06/20  Renal:  - CVVH started 03/25/20 - Intermittent HD dialysis started 04/14/20 - 04/16/20 Tunneled HD cath placed  Neuro: - Right MCA CVA on 03/27/20  Nitric Oxide: off 03/23/20  Blood Products:  Intraop: 6 FFP  DDAVP  420 cell saver  03/14/2020>>4 FFP 03/21/20>> 1 RBC 03/22/20>>1 RBC  Intra-op: 03/25/2020>>2 PCs      2 FFP Post-op: 03/28/2020>>3 PC's, 2 FFP 04/14/20>>1 PC's 04/15/20>> 1 PC's 04/18/20>>1 PC's 04/27/20>>1 PC's  Cultures:  -03/25/20- blood cultures>>negative - 04/06/2020-blood cultures>>negative - 04/03/20 -  respiratory culture>>neg   Device: -St [name] -Therapies: VF 194 - therapy on  Drips: - Milrinone 0.125 mcg/kg/min >> off 04/09/20 - Levo 1 mcg/min>> off 04/10/20 - Amiodarone 30 mg/hr-off - Tube feed: 50 ml/hr  Adverse Events on VAD: >>03/25/20 CVVH Started >>03/15/2020 left hemothorax; cardiac arrest >>04/14/20 HD  Pt Education:  1. No VAD education with patient this am; he is refusing to engage.  2. No family at bedside.   Plan/Recommendations:  1. Call VAD coordinator for any equipment or drive line issues.  2. Dressing changes Monday / Thursday per nurse champion or VAD coordinator.   Emerson Monte RN [place] Coordinator  Office: [phone]  24/7 Pager: [phone]

## 2020-04-30 ENCOUNTER — Inpatient Hospital Stay (HOSPITAL_COMMUNITY): Payer: Medicare HMO

## 2020-04-30 DIAGNOSIS — R627 Adult failure to thrive: Secondary | ICD-10-CM

## 2020-04-30 DIAGNOSIS — N186 End stage renal disease: Secondary | ICD-10-CM

## 2020-04-30 LAB — GLUCOSE, CAPILLARY
Glucose-Capillary: 99 mg/dL (ref 70–99)
Glucose-Capillary: 119 mg/dL — ABNORMAL HIGH (ref 70–99)
Glucose-Capillary: 127 mg/dL — ABNORMAL HIGH (ref 70–99)
Glucose-Capillary: 112 mg/dL — ABNORMAL HIGH (ref 70–99)
Glucose-Capillary: 141 mg/dL — ABNORMAL HIGH (ref 70–99)
Glucose-Capillary: 109 mg/dL — ABNORMAL HIGH (ref 70–99)

## 2020-04-30 LAB — RENAL FUNCTION PANEL
Albumin: 2.3 g/dL — ABNORMAL LOW (ref 3.5–5.0)
Anion gap: 12 (ref 5–15)
BUN: 58 mg/dL — ABNORMAL HIGH (ref 8–23)
CO2: 28 mmol/L (ref 22–32)
Calcium: 8.6 mg/dL — ABNORMAL LOW (ref 8.9–10.3)
Chloride: 95 mmol/L — ABNORMAL LOW (ref 98–111)
Creatinine, Ser: 4.57 mg/dL — ABNORMAL HIGH (ref 0.61–1.24)
GFR calc Af Amer: 14 mL/min — ABNORMAL LOW (ref >60)
GFR calc non Af Amer: 12 mL/min — ABNORMAL LOW (ref >60)
Glucose, Bld: 125 mg/dL — ABNORMAL HIGH (ref 70–99)
Phosphorus: 4.4 mg/dL (ref 2.5–4.6)
Potassium: 4.7 mmol/L (ref 3.5–5.1)
Sodium: 135 mmol/L (ref 135–145)

## 2020-04-30 LAB — PROTIME-INR
INR: 1.9 — ABNORMAL HIGH (ref 0.8–1.2)
Prothrombin Time: 21.1 seconds — ABNORMAL HIGH (ref 11.4–15.2)

## 2020-04-30 LAB — LACTATE DEHYDROGENASE: LDH: 218 U/L — ABNORMAL HIGH (ref 98–192)

## 2020-04-30 LAB — MAGNESIUM: Magnesium: 2.5 mg/dL — ABNORMAL HIGH (ref 1.7–2.4)

## 2020-04-30 MED ORDER — WARFARIN 0.5 MG HALF TABLET
0.5000 mg | ORAL_TABLET | Freq: Once | ORAL | Status: AC
  Administered 2020-04-30: 0.5 mg via ORAL
  Filled 2020-04-30: qty 1

## 2020-04-30 NOTE — Progress Notes (Signed)
Patient ID: [name] [name], male   DOB: [birth year], 69 y.o.   MRN: [id number]   Dr. Georgiann Mccoy spoke to Dr. Milas Kocher medicine regarding this patient's current medical situation and treatment plan into the future.  This NP visited patient at the bedside as a follow up for palliative medicine needs and emotional support.   I have been working with [name] [name] again and his family during this hospitalization and did an initial Palliative assessment on /03-19-20 prior to LVAD surgery.  Hospital course has been challenging and difficult for [name] [name].  Recently patient has refused all therapies.       Prior to LVAD implantation patient was clear that the rationale for moving forward with the surgery was for quality of life.  He made it known that he would never want to live in a nursing home, be a burden to others, or live on any life-prolonging machines.    Prior to LVAD surgery patient was clear that if he could not make decisions for himself that he would want his stepdaughter/[name] to be his main decision maker.  I sat with [name] [name] at the bedside today and discussed with him the idea of stopping dialysis and shifting focus of care to comfort, quality and dignity.  Patient states "that will be just fine".  I attempted to elicit any questions or concerns from time but he states "I really do not have anything to say".  He verbalizes he is not worried about anything. I sat with [name] [name] for quiet presence at the bedside.    I  spoke by phone with with [name] [name]/ step daughter/main support person here in [place] for ongoing discussion current medical situation, and to answer any questions or concerns.  Shared with Ander Purpura th plan to stop dialysis and allow for a more natural death.     [name] verbalizes an understanding of the current situation, "I just wish it was different".  This feels right knowing Mr [name] [name]'s previously verbalized EOL wishes   [name] plans to meet with  [name]/LCSW this afternoon at 330 for ongoing conversation  Questions and concerns addressed    Emotional support offered    PMT will continue to support holistically   Total time spent on the unit was 35 minutes    Discussed with [name] LCSW  Greater than 50% of the time was spent in counseling and coordination of care    Wadie Lessen NP  Palliative Medicine Team Team Phone # [phone] Pager [phone]

## 2020-04-30 NOTE — Progress Notes (Signed)
[facility] Progress Note     Assessment/ Plan:   1. AKI on CKD: BL Crt 1.5 on 03/07/20. AKI in setting of cardiogenic shock, pressors, s/p LVAD 03/27/2020. CRRT initiated on 03/25/20 and stopped 04/12/20.Transitioned to intermittent HD 04/14/20. RRT dependent for >30 days unlikely to recover 1. Poor OP candidate due to somnolence, minimal cooperation,  inability to sit up. Not participating in therapy.He was non committal about long term HD before and today he would not open his eyes to engage in dialogue. 2. Agree with Dr. Haroldine Laws to stop and not offer further RRT.  OK to leave the RIJ TC in for medication admistration for now.  Will sign off at this time; please reconsult as needed.   2. Vascular access- RIJ TDC placed 04/16/20 by IR.  3. sCHF s/p LVAD 04/10/2020 - cardiology managing. UF w/ HD 4. A fib s/p MAZE and LAA clipping 4/8 5. R MCA ischemic stroke- large, seen on CT on 03/27/20. Likely contributing to poor mental state 6. Left hemothorax s/p VATS 03/23/2020. 7. Anemia of CKD, multifactorial-s/p transfusion 04/14/20 and 5/7.Continue to follow, transfuse PRN.and continue ESA. Iron studies likely not accurate given recent transfusion 8. Hyperphosphatemia (BMD):Phosphorus improved with dialysis(3.6).Awaiting PTH results. Calcium acceptable (corrects to normal accounting for albumin). P5.1 (5/18) 9. Fatigue/minimal conversation:   Subjective:   Patient not actively participating in medical care making progression difficult.  He would not open his eyes for me today or engage.   Objective:   BP (!) 107/94 (BP Location: Left Arm)   Pulse 70   Temp 98.1 F (36.7 C) (Oral)   Resp 20   Ht 6' (1.829 m)   Wt 98 kg   SpO2 99%   BMI 29.30 kg/m   Intake/Output Summary (Last 24 hours) at 04/30/2020 1114 Last data filed at 04/29/2020 2222 Gross per 24 hour  Intake 10 ml  Output 50 ml  Net -40 ml   Weight change: -2.5 kg  Physical Exam: Gen: resting in bed in  NAD CVS: mechanical hum Resp: cta, bl chest rise Abd: +BS, soft, nt/nd Ext:1+ pitting edema in the left lower ankle, trace edema on the right lower extremity, warm extremeties Access: RIJ TC  Imaging: DG Chest Port 1 View  Result Date: 04/30/2020 CLINICAL DATA:  LVAD EXAM: PORTABLE CHEST 1 VIEW COMPARISON:  04/23/2020 FINDINGS: Patient is slightly rotated to the left. Enteric tube courses into the region of the stomach and off the film as tip is not visualized. Right IJ central venous catheter has tip at the cavoatrial junction unchanged. LVAD unchanged. Left-sided pacemaker unchanged. Lungs are adequately inflated with opacification over the left midlung and left base/retrocardiac region unchanged and may be due to effusion with atelectasis although infection is possible. Subtle hazy prominence of the perihilar vessels suggesting a degree of vascular congestion. Stable cardiomegaly. Remainder of the exam is unchanged. IMPRESSION: 1. Stable opacification over the left mid to lower lung likely effusion with atelectasis, although infection is possible. 2.  Stable cardiomegaly with suggestion of mild vascular congestion. 3.  Tubes and lines as described. Electronically Signed   By: Marin Olp M.D.   On: 04/30/2020 08:02    Labs: BMET Recent Labs  Lab 04/24/20 0831 04/25/20 0230 04/26/20 0259 04/27/20 0218 04/28/20 0228 04/29/20 0250 04/30/20 0709  NA 133* 133* 135 135 132* 131* 135  K 3.7 3.7 4.0 4.2 4.3 4.6 4.7  CL 93* 96* 99 99 96* 92* 95*  CO2 27 26 27 26  25  23 28  GLUCOSE 77 160* 121* 130* 123* 128* 125*  BUN 39* 67* 36* 61* 90* 114* 58*  CREATININE 4.17* 5.52* 3.63* 5.14* 6.50* 7.41* 4.57*  CALCIUM 8.5* 8.4* 8.3* 8.4* 8.4* 8.4* 8.6*  PHOS 4.5 6.1* 2.8 3.6 4.0 5.1* 4.4   CBC Recent Labs  Lab 04/26/20 0259 04/27/20 0218 04/28/20 0228 04/29/20 0818  WBC 8.4 8.7 9.4 9.2  HGB 7.4* 7.1* 7.9* 8.1*  HCT 24.1* 23.0* 24.9* 25.6*  MCV 88.6 90.2 88.3 89.2  PLT 268 248 233 214     Medications:    . sodium chloride   Intravenous Once  . aspirin  81 mg Per Tube Daily  . atorvastatin  80 mg Per Tube q1800  . bisacodyl  10 mg Oral Daily   Or  . bisacodyl  10 mg Rectal Daily  . carvedilol  3.125 mg Oral BID WC  . Chlorhexidine Gluconate Cloth  6 each Topical Q0600  . citalopram  10 mg Per Tube Daily  . darbepoetin (ARANESP) injection - NON-DIALYSIS  100 mcg Subcutaneous Q Tue-1800  . feeding supplement (NEPRO CARB STEADY)  237 mL Oral BID BM  . feeding supplement (PRO-STAT SUGAR FREE 64)  30 mL Per Tube BID  . influenza vaccine adjuvanted  0.5 mL Intramuscular Tomorrow-1000  . insulin aspart  0-24 Units Subcutaneous Q4H  . insulin aspart  6 Units Subcutaneous Q4H  . insulin glargine  24 Units Subcutaneous BID  . multivitamin  1 tablet Oral QHS  . pantoprazole sodium  40 mg Per Tube Daily  . sodium chloride flush  10-40 mL Intracatheter Q12H  . warfarin  0.5 mg Oral ONCE-1600  . Warfarin - Pharmacist Dosing Inpatient   Does not apply q1600      Otelia Santee, MD 04/30/2020, 11:14 AM

## 2020-04-30 NOTE — Progress Notes (Signed)
CSW met at bedside with patient and stepdaughter [name]. CSW discussed continued decline and lack of participation in plan of care. CSW discussed goals of care and discontinuing of dialysis which is consistent with patients expressed wished prior to surgery. Patient remained with his eyes closed and did not respond until patient's step daughter repeated the question asking if patient wanted to continue with dialysis. Patient opened his eyes and responded "I never wanted it in the first place". CSW asked patient if he understands what stopping dialysis means and he again opened his eyes and said "the day the music died". [name] became tearful and shared some warm comments to patient who smiled and was very receptive to her. [name] mentioned that she will call the family tonight to make them aware of patient's decision to stop dialysis. She asked patient if he would like to see his family and he responded "I would rather see my family while I'm alive than dead".  Patient appears to have good understanding of his medical situation and comprehension of stopping dialysis. [name] appears to understand the gravity of the situation and will contact family to visit with patient as soon as they are able to get to [place]. CSW will follow up with [name] tomorrow and continue to offer supportive intervention as needed. [name], [place], [place]

## 2020-04-30 NOTE — Progress Notes (Signed)
SLP Cancellation Note  Patient Details Name: [name] [name] MRN: [id number] DOB: 08-15-1951   Cancelled treatment:       Reason Eval/Treat Not Completed: Patient declined, no reason specified; Other (Per RN, pt is still refusing p.o. intake and is complaining of nausea. Per Dr. Clayborne Dana note, the family is considering discontinuing aggressive therapy. SLP will defer treatment at this time.)  Adrian Dinovo I. Hardin Negus, [place], [place] Office number [phone] Pager [phone]  Horton Marshall 04/30/2020, 2:15 PM

## 2020-04-30 NOTE — Plan of Care (Signed)
  Problem: Education: Goal: Knowledge of General Education information will improve Description: Including pain rating scale, medication(s)/side effects and non-pharmacologic comfort measures Outcome: Not Progressing   Problem: Health Behavior/Discharge Planning: Goal: Ability to manage health-related needs will improve Outcome: Not Progressing   Problem: Clinical Measurements: Goal: Ability to maintain clinical measurements within normal limits will improve Outcome: Not Progressing Goal: Will remain free from infection Outcome: Not Progressing Goal: Diagnostic test results will improve Outcome: Not Progressing Goal: Respiratory complications will improve Outcome: Not Progressing Goal: Cardiovascular complication will be avoided Outcome: Not Progressing   Problem: Nutrition: Goal: Adequate nutrition will be maintained Outcome: Not Progressing   Problem: Elimination: Goal: Will not experience complications related to bowel motility Outcome: Not Progressing   Problem: Safety: Goal: Ability to remain free from injury will improve Outcome: Not Progressing   Problem: Skin Integrity: Goal: Risk for impaired skin integrity will decrease Outcome: Not Progressing   Problem: Education: Goal: Knowledge of the prescribed therapeutic regimen will improve Outcome: Not Progressing   Problem: Activity: Goal: Risk for activity intolerance will decrease Outcome: Not Progressing   Problem: Cardiac: Goal: Ability to maintain an adequate cardiac output will improve Outcome: Not Progressing   Problem: Coping: Goal: Level of anxiety will decrease Outcome: Not Progressing   Problem: Fluid Volume: Goal: Risk for excess fluid volume will decrease Outcome: Not Progressing   Problem: Clinical Measurements: Goal: Ability to maintain clinical measurements within normal limits will improve Outcome: Not Progressing Goal: Will remain free from infection Outcome: Not Progressing    Problem: Respiratory: Goal: Will regain and/or maintain adequate ventilation Outcome: Not Progressing   Problem: Education: Goal: Knowledge of secondary prevention will improve Outcome: Not Progressing Goal: Knowledge of patient specific risk factors addressed and post discharge goals established will improve Outcome: Not Progressing Goal: Individualized Educational Video(s) Outcome: Not Progressing

## 2020-04-30 NOTE — Progress Notes (Signed)
Inpatient Rehabilitation-Admissions Coordinator   It appears pt may be heading towards comfort care. AC will sign off at this time.   If something were to change, please re-consult CIR.   Raechel Ache, OTR/L  Rehab Admissions Coordinator  [phone] 04/30/2020 12:16 PM

## 2020-04-30 NOTE — Progress Notes (Signed)
LVAD Coordinator Rounding Note:  HM III LVAD implanted on 04/08/2020 with MAZE procedure + LAA clipping  by Dr Orvan Seen under destination therapy criteria due to advanced age. Primary Heart Failure Cardiologist is Dr. Haroldine Laws.   Pt asleep in bed. Would not arouse or interact with VAD coordinator. Transitioning pt over to palliative care.  Vital signs: Temp: 97.7 HR: 70 - afib Cuff: 97/72 (80) Doppler: 100  O2 Sat: 99% on RA Wt: 253.3>262.3>260.8>259.9>237.8.Marland Kitchen..217.5>224.8>218.7>221.7>216.7>220.4>214.5>216 lbs   LVAD interrogation reveals:  Speed: 5300 Flow: 4 Power: 3.7w  PI: 4.4  Alarms: none Events: 3 PI Hematocrit: 25  Fixed speed: 5300 Low speed limit: 5000   Drive Line:  Existing VAD dressing clean, dry, intact.  Drive line anchor secure. Dressing changes twice weekly (Monday / Thursday) dressing changes per nurse champion or VAD coordinator.  Next dressing change due 04/28/20.  Labs:  LDH trend: 360>513>482>...241>253>224>225>215>222>238>231>219>227>218   INR trend: 1.3>6.2>9>4.4>6.2>2.2.Marland Kitchen..2.1>2.4>2.1>2.2>2.4>2.6>3.0>1.0>1.7>1.9  CR: 3.57>3.98>3.18>2.44...4.54>6.3>4.34>6.4>7.14>8.32>5.59>4.17>5.52>6.5>7.41>4.57    Anticoagulation Plan: -INR Goal: 1.8 - 2.3  -ASA Dose: 81 mg - will continue due to hx of CVA per Dr. Haroldine Laws  Respiratory: - extubated 4/9/2 - re-intubated 03/27/2020 s/p left hemothorax with cardiac arrest - extubated 04/06/20  Renal:  - CVVH started 03/25/20 - Intermittent HD dialysis started 04/14/20 - 04/16/20 Tunneled HD cath placed  Neuro: - Right MCA CVA on 03/27/20  Nitric Oxide: off 03/23/20  Blood Products:  Intraop: 6 FFP  DDAVP  420 cell saver  03/23/2020>>4 FFP 03/21/20>> 1 RBC 03/22/20>>1 RBC  Intra-op: 03/21/2020>>2 PCs      2 FFP Post-op: 04/08/2020>>3 PC's, 2 FFP 04/14/20>>1 PC's 04/15/20>> 1 PC's 04/18/20>>1 PC's 04/27/20>>1 PC's  Cultures:  -03/25/20- blood cultures>>negative - 03/27/2020-blood cultures>>negative - 04/03/20 - respiratory  culture>>neg   Device: -St [name] -Therapies: VF 194 - therapy on  Drips: - Milrinone 0.125 mcg/kg/min >> off 04/09/20 - Levo 1 mcg/min>> off 04/10/20 - Amiodarone 30 mg/hr-off - Tube feed: 50 ml/hr  Adverse Events on VAD: >>03/25/20 CVVH Started >>03/29/2020 left hemothorax; cardiac arrest >>04/14/20 HD  Pt Education:  1. No VAD education with patient this am; he is refusing to engage.  2. No family at bedside.   Plan/Recommendations:  1. Call VAD coordinator for any equipment or drive line issues.  2. Dressing changes Monday / Thursday per nurse champion or VAD coordinator.   Tanda Rockers RN [place] Coordinator  Office: [phone]  24/7 Pager: [phone]

## 2020-04-30 NOTE — Progress Notes (Signed)
CSW visited bedside and unable to get patient to open eyes or converse with CSW. Patient's meal was sitting at bedside untouched. CSW contacted step daughter Ander Purpura by phone to discuss patient status. [name] shared that she visited yesterday and witnessed patient's behavior with the staff and stated that he was rude to her as well. She stated that she tried to encourage him to participate in his plan of care to no avail. She verbalized frustration with his lack of participation in his recovery. CSW discussed concerns with inability to go to rehab due to lack of participation in recovery and would not be appropriate for rehab referral. CSW discussed that patient was adamant prior to surgery that he did not want to be a burden on others and currently he is requiring total care. Patient continues to refuse PT/OT, he is not eating and refusing most bedside care with staff. [name] acknowledges understanding of the gravity of the situation and that patient did not want to be a burden. CSW discussed SNF placement and the challenges as well as that patient shared he did not want to go to a SNF or be a "burden". [name] agreed that patient would not want to go to a SNF. [name] shared that she will discuss further with other family and meet at bedside tomorrow to discuss further goals of care. [name] appears very realistic and verbalizes understanding of the current situation. [name], [place], [place]

## 2020-04-30 NOTE — Progress Notes (Signed)
ANTICOAGULATION CONSULT NOTE  Pharmacy Consult for warfarin Indication: post op LVAD  / Aflutter  Allergies  Allergen Reactions  . Liraglutide Nausea And Vomiting  . Lisinopril Cough    Patient Measurements: Height: 6' (182.9 cm) Weight: 98 kg (216 lb 0.8 oz) IBW/kg (Calculated) : 77.6 Heparin Dosing Weight: 101.2 kg  Vital Signs: Temp: 97.9 F (36.6 C) (05/19 0654) Temp Source: Oral (05/19 0654) BP: 110/85 (05/19 0654) Pulse Rate: 67 (05/19 0654)  Labs: Recent Labs    04/28/20 0228 04/29/20 0250 04/29/20 0818 04/30/20 0709  HGB 7.9*  --  8.1*  --   HCT 24.9*  --  25.6*  --   PLT 233  --  214  --   LABPROT 19.4* 21.2*  --  21.1*  INR 1.7* 1.9*  --  1.9*  CREATININE 6.50* 7.41*  --  4.57*    Estimated Creatinine Clearance: 18.3 mL/min (A) (by C-G formula based on SCr of 4.57 mg/dL (H)).   Assessment: 22 YOM with atrial fibrillation on Eliquis PTA s/p R & L heart cath found to have 3v disease s/p  LVAD  Implant 4/8. Warfarin initiated on 4/9 after discussing with MD.  CT on 4/15 - found to have large right MCA stroke. INR goal reduced 5/4 - [id number]  INR remains 1.9 at goal  Hgb remains low ~ 8, had PRBCs 5/16. No bleeding noted  Goal of Therapy:  INR 1.8-2.3 Monitor platelets by anticoagulation protocol: Yes   Plan:  -Warfarin 0.5 mg x 1 tonight  -Daily PT/INR   Marguerite Olea, [facility] Clinical Pharmacist Phone [phone]  04/30/2020 10:38 AM

## 2020-04-30 NOTE — Progress Notes (Signed)
Patient ID: [name] [name], male   DOB: [birth year], 69 y.o.   MRN: [id number]    Advanced Heart Failure Rounding Note   Subjective:    - S/p HM3 VAD 4/8 w MAZE procedure + LAA clipping. - Extubated 4/9 - 4/12 LVAD speed increased to 5400 --> echo moderate-severe RV dysfunction, septum mildly left shifted, trivial pericardial effusion.  LVAD speed cut back to 5300.  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  - 4/20 developed hemothorax requiring emergent CT placement and eventual VATS - 4/21 EEG done given concerns for posturing and c/w moderate to severe diffuse encephalopathy. No seizures or epileptiform discharges were seen  - 04/03/20 Pump Stop --> controller change out. CT of head unchanged R MCA CVA - 04/06/20 Extubated - 5/1 CRRT stopped. Had first iHD 5/3.  - 5/4 1uRBC - 5/6 1u RBC -5/16 1uRBC  Refusing to interact in any substantial way.    VAD Interrogation HMIII  Flow 3.7 Speed 5300, Power 4 Pulse Index 6.7    Objective:   Weight Range:  Vital Signs:   Temp:  [97.8 F (36.6 C)-98.5 F (36.9 C)] 97.9 F (36.6 C) (05/19 0654) Pulse Rate:  [64-89] 67 (05/19 0654) Resp:  [13-18] 14 (05/19 0654) BP: (93-126)/(53-102) 110/85 (05/19 0654) SpO2:  [95 %-100 %] 99 % (05/19 0654) Weight:  [98 kg] 98 kg (05/19 0413) Last BM Date: 04/28/20  Weight change: Filed Weights   04/29/20 0421 04/29/20 0800 04/30/20 0413  Weight: 101 kg 98.5 kg 98 kg    Intake/Output:   Intake/Output Summary (Last 24 hours) at 04/30/2020 1038 Last data filed at 04/29/2020 2222 Gross per 24 hour  Intake 10 ml  Output 50 ml  Net -40 ml    MAP 80s     Physical Exam: GENERAL:No acute distress. HEENT: [place] Neck: supple. JVP not elevated.  Carotids 2+ bilat; no bruits. No lymphadenopathy or thryomegaly appreciated. Cor: LVAD hum.  Lungs: Clear. Abdomen: soft, nontender, non-distended. No hepatosplenomegaly. No bruits or masses. Good bowel sounds. Driveline site clean.  Anchor in place.  Extremities: no cyanosis, clubbing, rash. Warm no edema  Neuro: awake moves all 4 refuses to interact much        Labs: Basic Metabolic Panel: Recent Labs  Lab 04/26/20 0259 04/26/20 0259 04/27/20 0218 04/27/20 0218 04/28/20 0228 04/29/20 0250 04/30/20 0709  NA 135  --  135  --  132* 131* 135  K 4.0  --  4.2  --  4.3 4.6 4.7  CL 99  --  99  --  96* 92* 95*  CO2 27  --  26  --  25 23 28   GLUCOSE 121*  --  130*  --  123* 128* 125*  BUN 36*  --  61*  --  90* 114* 58*  CREATININE 3.63*  --  5.14*  --  6.50* 7.41* 4.57*  CALCIUM 8.3*   < > 8.4*   < > 8.4* 8.4* 8.6*  MG 2.1  --  2.3  --  2.5* 2.7* 2.5*  PHOS 2.8  --  3.6  --  4.0 5.1* 4.4   < > = values in this interval not displayed.    Liver Function Tests: Recent Labs  Lab 04/26/20 0259 04/27/20 0218 04/28/20 0228 04/29/20 0250 04/30/20 0709  ALBUMIN 2.3* 2.2* 2.2* 2.2* 2.3*   No results for input(s): LIPASE, AMYLASE in the last 168 hours. No results for input(s): AMMONIA in the last  168 hours.  CBC: Recent Labs  Lab 04/25/20 0230 04/26/20 0259 04/27/20 0218 04/28/20 0228 04/29/20 0818  WBC 8.9 8.4 8.7 9.4 9.2  HGB 7.6* 7.4* 7.1* 7.9* 8.1*  HCT 24.8* 24.1* 23.0* 24.9* 25.6*  MCV 87.9 88.6 90.2 88.3 89.2  PLT 281 268 248 233 214    Cardiac Enzymes: No results for input(s): CKTOTAL, CKMB, CKMBINDEX, TROPONINI in the last 168 hours.  BNP: BNP (last 3 results) Recent Labs    04/02/20 2342 04/10/20 0132 04/17/20 0043  BNP 332.7* 267.2* 582.1*    ProBNP (last 3 results) No results for input(s): PROBNP in the last 8760 hours.    Other results:  Imaging: DG Chest Port 1 View  Result Date: 04/30/2020 CLINICAL DATA:  LVAD EXAM: PORTABLE CHEST 1 VIEW COMPARISON:  04/23/2020 FINDINGS: Patient is slightly rotated to the left. Enteric tube courses into the region of the stomach and off the film as tip is not visualized. Right IJ central venous catheter has tip at the cavoatrial  junction unchanged. LVAD unchanged. Left-sided pacemaker unchanged. Lungs are adequately inflated with opacification over the left midlung and left base/retrocardiac region unchanged and may be due to effusion with atelectasis although infection is possible. Subtle hazy prominence of the perihilar vessels suggesting a degree of vascular congestion. Stable cardiomegaly. Remainder of the exam is unchanged. IMPRESSION: 1. Stable opacification over the left mid to lower lung likely effusion with atelectasis, although infection is possible. 2.  Stable cardiomegaly with suggestion of mild vascular congestion. 3.  Tubes and lines as described. Electronically Signed   By: Marin Olp M.D.   On: 04/30/2020 08:02     Medications:     Scheduled Medications: . sodium chloride   Intravenous Once  . aspirin  81 mg Per Tube Daily  . atorvastatin  80 mg Per Tube q1800  . bisacodyl  10 mg Oral Daily   Or  . bisacodyl  10 mg Rectal Daily  . carvedilol  3.125 mg Oral BID WC  . Chlorhexidine Gluconate Cloth  6 each Topical Q0600  . citalopram  10 mg Per Tube Daily  . darbepoetin (ARANESP) injection - NON-DIALYSIS  100 mcg Subcutaneous Q Tue-1800  . feeding supplement (NEPRO CARB STEADY)  237 mL Oral BID BM  . feeding supplement (PRO-STAT SUGAR FREE 64)  30 mL Per Tube BID  . influenza vaccine adjuvanted  0.5 mL Intramuscular Tomorrow-1000  . insulin aspart  0-24 Units Subcutaneous Q4H  . insulin aspart  6 Units Subcutaneous Q4H  . insulin glargine  24 Units Subcutaneous BID  . multivitamin  1 tablet Oral QHS  . pantoprazole sodium  40 mg Per Tube Daily  . sodium chloride flush  10-40 mL Intracatheter Q12H  . Warfarin - Pharmacist Dosing Inpatient   Does not apply q1600    Infusions: . sodium chloride Stopped (04/15/20 1731)  . feeding supplement (NEPRO CARB STEADY) 1,000 mL (04/30/20 0826)    PRN Medications: sodium chloride, acetaminophen, dextrose, heparin sodium (porcine), hydrALAZINE,  levalbuterol, ondansetron (ZOFRAN) IV, oxyCODONE, promethazine, sodium chloride flush, sorbitol, traMADol   Assessment/Plan:   1. Acute on chronic systolic HF -> cardiogenic shock-> S/p HM3 LVAD - Echo 2016 EF 30-35% - Echo 1/21 EF 25% - Admitted with NYHA IV symptoms and AKI with attempts at diuresis. Initial co-ox 39% - Echo this admit: EF 10-15% moderate RV dysfunction - R/LHC cath 3/21 with severe 3v CAD and low output with CI 1.7.   - S/p HM3 VAD 4/8 -  Volume status managed per HD - Patient continuing to refuse all therapies. Long discussion with SW team over past few days who knows patient and family well. He has clearly stated several times that he would not want to live in a nursing home or burden others. Prior to his surgery he was clear to numerous providers that his stepdaughter should make decisions for him if he can't. Said he did not want his son making decisions for him.     We d/w stepdaughter and I think we all agree that continuing aggressive therapy is not in his best interest or something that he would want as he clearly cannot care for himself and would not be eligible (or want) SNF in setting of VAD and ESRD with need for HD.      I think the only option is to stop HD and switch to comfort care. I have d/w Nephrology and asked them to hold further HD. When he reaches a terminal state would then deactivate VAD. I have a call in to the Palliative Care to help with final decision making but I do not see any alternative unfortunately.    2. VAD - VAD interrogated personally. Parameters stable. - INR today 1.9 today.  - Plana s above  3. CAD with unstable angina - No s/s ischemia  4. AKI on CKD 3a -> ESRD - Remains anuric. On iHD - Plan as above. Stop H  5. Severe Protein Malnutrition  - Albumin 2.2 - Tube feeds ongoing. He is refusing to eat.   6.CVA/anoxic brain injury - 03/27/20 CT large posterior MCA ischemic infarct, possibly from atrial fibrillation.   I   Length of Stay: 31   Glori Bickers, MD  10:38 AM  04/30/2020

## 2020-05-01 DIAGNOSIS — R627 Adult failure to thrive: Secondary | ICD-10-CM

## 2020-05-01 DIAGNOSIS — N186 End stage renal disease: Secondary | ICD-10-CM

## 2020-05-01 LAB — RENAL FUNCTION PANEL
Albumin: 2.4 g/dL — ABNORMAL LOW (ref 3.5–5.0)
Anion gap: 15 (ref 5–15)
BUN: 78 mg/dL — ABNORMAL HIGH (ref 8–23)
CO2: 26 mmol/L (ref 22–32)
Calcium: 8.5 mg/dL — ABNORMAL LOW (ref 8.9–10.3)
Chloride: 94 mmol/L — ABNORMAL LOW (ref 98–111)
Creatinine, Ser: 5.51 mg/dL — ABNORMAL HIGH (ref 0.61–1.24)
GFR calc Af Amer: 11 mL/min — ABNORMAL LOW (ref >60)
GFR calc non Af Amer: 10 mL/min — ABNORMAL LOW (ref >60)
Glucose, Bld: 111 mg/dL — ABNORMAL HIGH (ref 70–99)
Phosphorus: 4.9 mg/dL — ABNORMAL HIGH (ref 2.5–4.6)
Potassium: 4.8 mmol/L (ref 3.5–5.1)
Sodium: 135 mmol/L (ref 135–145)

## 2020-05-01 LAB — GLUCOSE, CAPILLARY
Glucose-Capillary: 105 mg/dL — ABNORMAL HIGH (ref 70–99)
Glucose-Capillary: 111 mg/dL — ABNORMAL HIGH (ref 70–99)
Glucose-Capillary: 127 mg/dL — ABNORMAL HIGH (ref 70–99)
Glucose-Capillary: 128 mg/dL — ABNORMAL HIGH (ref 70–99)
Glucose-Capillary: 130 mg/dL — ABNORMAL HIGH (ref 70–99)
Glucose-Capillary: 150 mg/dL — ABNORMAL HIGH (ref 70–99)

## 2020-05-01 LAB — LACTATE DEHYDROGENASE: LDH: 230 U/L — ABNORMAL HIGH (ref 98–192)

## 2020-05-01 LAB — PROTIME-INR
INR: 1.9 — ABNORMAL HIGH (ref 0.8–1.2)
Prothrombin Time: 20.7 seconds — ABNORMAL HIGH (ref 11.4–15.2)

## 2020-05-01 LAB — MAGNESIUM: Magnesium: 2.5 mg/dL — ABNORMAL HIGH (ref 1.7–2.4)

## 2020-05-01 MED ORDER — DEXTROSE 50 % IV SOLN
INTRAVENOUS | Status: AC
  Filled 2020-05-01: qty 50

## 2020-05-01 MED ORDER — WARFARIN SODIUM 1 MG PO TABS
1.0000 mg | ORAL_TABLET | Freq: Once | ORAL | Status: AC
  Administered 2020-05-01: 1 mg via ORAL
  Filled 2020-05-01: qty 1

## 2020-05-01 NOTE — Progress Notes (Signed)
PT Cancellation Note  Patient Details Name: [name] [name] MRN: [id number] DOB: 10/09/1951   Cancelled Treatment:    Reason Eval/Treat Not Completed: Other (comment)(pt with HD stopped and awaiting family to arrive prior to transition to full comfort but not currently seeking aggressive care or therapy and will hold. Discussed with RN)   Sandy Salaam Prater 05/01/2020, 8:29 AM  Bayard Males, PT Acute Rehabilitation Services Pager: [phone] Office: [phone]

## 2020-05-01 NOTE — Progress Notes (Signed)
Nutrition Brief Note  Chart reviewed. Pt now transitioning to comfort care. Awaiting family visit.  No further nutrition interventions warranted at this time. Will defer discontinuation of tube feeds to Heart Failure Team but will d/c oral nutrition supplements.  Please re-consult as needed.    Gaynell Face, MS, RD, LDN Inpatient Clinical Dietitian Pager: [phone] Weekend/After Hours: [phone]

## 2020-05-01 NOTE — Progress Notes (Signed)
CSW visited bedside and patient sound asleep. CSW attempted to arouse with no response. Patient's lunch was on bedside tray untouched.  CSW contacted patient's step daughter Ander Purpura to follow up on yesterdays conversation. [name] stated " I am sad about it but this is what he wants" She reports she contacted both patient's daughter Tammi Klippel and his brother Mortimer Fries to update on current health status and encourage them to visit. Daughter lauren will be at bedside later today. CSW offered supportive intervention and to pass on CSW contact info to family if they have any questions or concerns. [name] very appreciative of the support call. CSW continues to follow for supportive needs. [name], [place], [place]

## 2020-05-01 NOTE — Progress Notes (Signed)
Patient's daughter Jolayne Haines called this evening and asked to set up a video chat with patient. RN called [employer] and gave them Bobbie's number and Pam at [place] set up call. Patient spoke to his daughter briefly. She states that she is trying to arrange things so that she can get here from [place].

## 2020-05-01 NOTE — Progress Notes (Signed)
Patient refusing medications this evening, states "You're not putting anything in my tube!" I offered to give them by mouth and again he stated "No, I don't want it, I feel fine." RN educated patient regarding the importance of his medication, especially coumadin. He again stated "I don't care, leave me alone".

## 2020-05-01 NOTE — Progress Notes (Addendum)
Patient ID: [name] [name], male   DOB: 1951/03/18, 69 y.o.   MRN: [id number]    Advanced Heart Failure Rounding Note   Subjective:    - S/p HM3 VAD 4/8 w MAZE procedure + LAA clipping. - Extubated 4/9 - 4/12 LVAD speed increased to 5400 --> echo moderate-severe RV dysfunction, septum mildly left shifted, trivial pericardial effusion.  LVAD speed cut back to 5300.  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  - 4/20 developed hemothorax requiring emergent CT placement and eventual VATS - 4/21 EEG done given concerns for posturing and c/w moderate to severe diffuse encephalopathy. No seizures or epileptiform discharges were seen  - 04/03/20 Pump Stop --> controller change out. CT of head unchanged R MCA CVA - 04/06/20 Extubated - 5/1 CRRT stopped. Had first iHD 5/3.  - 5/4 1uRBC - 5/6 1u RBC -5/16 1uRBC  Yesterday he decided he wants to stop dialysis. Made 425 ccc urine.    Refusing to open his eyes.   VAD Interrogation HMIII  Flow 3.5 Speed 5300, Power 4 Pulse Index 7.1    Objective:   Weight Range:  Vital Signs:   Temp:  [97.6 F (36.4 C)-98.1 F (36.7 C)] 97.8 F (36.6 C) (05/20 0735) Pulse Rate:  [63-95] 95 (05/20 0735) Resp:  [11-20] 15 (05/20 0735) BP: (94-126)/(72-102) 113/84 (05/20 0735) SpO2:  [94 %-99 %] 98 % (05/20 0735) Weight:  [97.1 kg] 97.1 kg (05/20 0553) Last BM Date: 04/28/20  Weight change: Filed Weights   04/29/20 0800 04/30/20 0413 05/01/20 0553  Weight: 98.5 kg 98 kg 97.1 kg    Intake/Output:   Intake/Output Summary (Last 24 hours) at 05/01/2020 0812 Last data filed at 05/01/2020 0553 Gross per 24 hour  Intake 10 ml  Output 425 ml  Net -415 ml    Maps 90s    Physical Exam: GENERAL: no acute distress. HEENT: normal  NECK: Supple, JVP 9-10 .  2+ bilaterally, no bruits.  No lymphadenopathy or thyromegaly appreciated.   CARDIAC:  Mechanical heart sounds with LVAD hum present.  LUNGS:  Clear to auscultation bilaterally.   ABDOMEN:  Soft, round, nontender, positive bowel sounds x4.     LVAD exit site:  Dressing dry and intact.  No erythema or drainage.  Stabilization device present and accurately applied.  Driveline dressing is being changed daily per sterile technique. EXTREMITIES:  Warm and dry, no cyanosis, clubbing, rash or edema  NEUROLOGIC:  Refusing to open eyes. No aphasia.  No dysarthria.  Affect flat        Labs: Basic Metabolic Panel: Recent Labs  Lab 04/27/20 0218 04/27/20 0218 04/28/20 0228 04/28/20 0228 04/29/20 0250 04/30/20 0709 05/01/20 0146  NA 135  --  132*  --  131* 135 135  K 4.2  --  4.3  --  4.6 4.7 4.8  CL 99  --  96*  --  92* 95* 94*  CO2 26  --  25  --  23 28 26   GLUCOSE 130*  --  123*  --  128* 125* 111*  BUN 61*  --  90*  --  114* 58* 78*  CREATININE 5.14*  --  6.50*  --  7.41* 4.57* 5.51*  CALCIUM 8.4*   < > 8.4*   < > 8.4* 8.6* 8.5*  MG 2.3  --  2.5*  --  2.7* 2.5* 2.5*  PHOS 3.6  --  4.0  --  5.1* 4.4 4.9*   < > =  values in this interval not displayed.    Liver Function Tests: Recent Labs  Lab 04/27/20 0218 04/28/20 0228 04/29/20 0250 04/30/20 0709 05/01/20 0146  ALBUMIN 2.2* 2.2* 2.2* 2.3* 2.4*   No results for input(s): LIPASE, AMYLASE in the last 168 hours. No results for input(s): AMMONIA in the last 168 hours.  CBC: Recent Labs  Lab 04/25/20 0230 04/26/20 0259 04/27/20 0218 04/28/20 0228 04/29/20 0818  WBC 8.9 8.4 8.7 9.4 9.2  HGB 7.6* 7.4* 7.1* 7.9* 8.1*  HCT 24.8* 24.1* 23.0* 24.9* 25.6*  MCV 87.9 88.6 90.2 88.3 89.2  PLT 281 268 248 233 214    Cardiac Enzymes: No results for input(s): CKTOTAL, CKMB, CKMBINDEX, TROPONINI in the last 168 hours.  BNP: BNP (last 3 results) Recent Labs    04/02/20 2342 04/10/20 0132 04/17/20 0043  BNP 332.7* 267.2* 582.1*    ProBNP (last 3 results) No results for input(s): PROBNP in the last 8760 hours.    Other results:  Imaging: DG Chest Port 1 View  Result Date: 04/30/2020 CLINICAL  DATA:  LVAD EXAM: PORTABLE CHEST 1 VIEW COMPARISON:  04/23/2020 FINDINGS: Patient is slightly rotated to the left. Enteric tube courses into the region of the stomach and off the film as tip is not visualized. Right IJ central venous catheter has tip at the cavoatrial junction unchanged. LVAD unchanged. Left-sided pacemaker unchanged. Lungs are adequately inflated with opacification over the left midlung and left base/retrocardiac region unchanged and may be due to effusion with atelectasis although infection is possible. Subtle hazy prominence of the perihilar vessels suggesting a degree of vascular congestion. Stable cardiomegaly. Remainder of the exam is unchanged. IMPRESSION: 1. Stable opacification over the left mid to lower lung likely effusion with atelectasis, although infection is possible. 2.  Stable cardiomegaly with suggestion of mild vascular congestion. 3.  Tubes and lines as described. Electronically Signed   By: Marin Olp M.D.   On: 04/30/2020 08:02     Medications:     Scheduled Medications: . sodium chloride   Intravenous Once  . aspirin  81 mg Per Tube Daily  . atorvastatin  80 mg Per Tube q1800  . bisacodyl  10 mg Oral Daily   Or  . bisacodyl  10 mg Rectal Daily  . carvedilol  3.125 mg Oral BID WC  . Chlorhexidine Gluconate Cloth  6 each Topical Q0600  . citalopram  10 mg Per Tube Daily  . darbepoetin (ARANESP) injection - NON-DIALYSIS  100 mcg Subcutaneous Q Tue-1800  . feeding supplement (NEPRO CARB STEADY)  237 mL Oral BID BM  . feeding supplement (PRO-STAT SUGAR FREE 64)  30 mL Per Tube BID  . influenza vaccine adjuvanted  0.5 mL Intramuscular Tomorrow-1000  . insulin aspart  0-24 Units Subcutaneous Q4H  . insulin aspart  6 Units Subcutaneous Q4H  . insulin glargine  24 Units Subcutaneous BID  . multivitamin  1 tablet Oral QHS  . pantoprazole sodium  40 mg Per Tube Daily  . sodium chloride flush  10-40 mL Intracatheter Q12H  . Warfarin - Pharmacist Dosing  Inpatient   Does not apply q1600    Infusions: . sodium chloride Stopped (04/15/20 1731)  . feeding supplement (NEPRO CARB STEADY) 1,000 mL (05/01/20 0755)    PRN Medications: sodium chloride, acetaminophen, dextrose, heparin sodium (porcine), hydrALAZINE, levalbuterol, ondansetron (ZOFRAN) IV, oxyCODONE, promethazine, sodium chloride flush, sorbitol, traMADol   Assessment/Plan:   1. Acute on chronic systolic HF -> cardiogenic shock-> S/p HM3 LVAD - Echo  2016 EF 30-35% - Echo 1/21 EF 25% - Admitted with NYHA IV symptoms and AKI with attempts at diuresis. Initial co-ox 39% - Echo this admit: EF 10-15% moderate RV dysfunction - R/LHC cath 3/21 with severe 3v CAD and low output with CI 1.7.   - S/p HM3 VAD 4/8 - Volume status managed per HD - Patient continuing to refuse all therapies. Long discussion with SW team over past few days who knows patient and family well.  - He has stopped dialysis and wants to switch to comfort care.   2. VAD - VAD interrogated personally. Parameters stable.  - INR today 1.9 today.   3. CAD with unstable angina - No s/s ischemia  4. AKI on CKD 3a -> ESRD - made 425 cc of urine.  - He stopped dialysis as of 04/30/20   5. Severe Protein Malnutrition  - Albumin 2.2 - Tube feeds ongoing. He is refusing to eat. - Should be able to stop soon feeds with switch to comfort care. .    6.CVA/anoxic brain injury - 03/27/20 CT large posterior MCA ischemic infarct, possibly from atrial fibrillation.   Switching to comfort care. Stopped dialysis 04/30/20. Waiting on family for visit.  SW and Palliative Care appreciated.    Length of Stay: [place], NP  8:12 AM  05/01/2020   Patient seen and examined with the above-signed Advanced Practice Provider and/or Housestaff. I personally reviewed laboratory data, imaging studies and relevant notes. I independently examined the patient and formulated the important aspects of the plan. I have edited the note  to reflect any of my changes or salient points. I have personally discussed the plan with the patient and/or family.  He was initially somehwat interactive with me today and then I asked him what he though about next steps. We talked about VAD support and HD. I asked him if he really wanted to stop HD. He then stopped interacting with me. After several minutes, I said that based on our discussions over the last few days the plan was not to pursue further HD. He did not respond to this. I eventually said, that unless he tells me otherwise he will not receive any further HD treatments. He did not say a word and refused to open his eyes again.   General: sitting up in bed NAD.  HEENT: normal  Neck: supple. JVP 6-7  Carotids 2+ bilat; no bruits. No lymphadenopathy or thryomegaly appreciated. Cor: LVAD hum.  Lungs: Clear. Abdomen: obese soft, nontender, non-distended. No hepatosplenomegaly. No bruits or masses. Good bowel sounds. Driveline site clean. Anchor in place.  Extremities: no cyanosis, clubbing, rash. Warm no edema  Neuro: alert & oriented initially then stopped interacting   Based on extensive discussions will continue with current plan to not proceed with any further HD treatments unless he changes his mind. I expect he will become uremic and SOB in next few days and we will have to provide comfort meds. That said he has made some urine over paste few days and we will continue to monitor for renal recovery. VAD interrogated personally. Parameters stable.  Glori Bickers, MD  8:43 PM

## 2020-05-01 NOTE — Plan of Care (Signed)
  Problem: Education: Goal: Knowledge of General Education information will improve Description: Including pain rating scale, medication(s)/side effects and non-pharmacologic comfort measures Outcome: Not Progressing   Problem: Health Behavior/Discharge Planning: Goal: Ability to manage health-related needs will improve Outcome: Not Progressing   Problem: Clinical Measurements: Goal: Ability to maintain clinical measurements within normal limits will improve Outcome: Not Progressing Goal: Will remain free from infection Outcome: Not Progressing Goal: Diagnostic test results will improve Outcome: Not Progressing Goal: Respiratory complications will improve Outcome: Not Progressing Goal: Cardiovascular complication will be avoided Outcome: Not Progressing   Problem: Nutrition: Goal: Adequate nutrition will be maintained Outcome: Not Progressing   Problem: Elimination: Goal: Will not experience complications related to bowel motility Outcome: Not Progressing   Problem: Safety: Goal: Ability to remain free from injury will improve Outcome: Not Progressing   Problem: Skin Integrity: Goal: Risk for impaired skin integrity will decrease Outcome: Not Progressing   Problem: Education: Goal: Knowledge of the prescribed therapeutic regimen will improve Outcome: Not Progressing   Problem: Activity: Goal: Risk for activity intolerance will decrease Outcome: Not Progressing   Problem: Cardiac: Goal: Ability to maintain an adequate cardiac output will improve Outcome: Not Progressing   Problem: Coping: Goal: Level of anxiety will decrease Outcome: Not Progressing   Problem: Fluid Volume: Goal: Risk for excess fluid volume will decrease Outcome: Not Progressing   Problem: Clinical Measurements: Goal: Ability to maintain clinical measurements within normal limits will improve Outcome: Not Progressing Goal: Will remain free from infection Outcome: Not Progressing    Problem: Respiratory: Goal: Will regain and/or maintain adequate ventilation Outcome: Not Progressing   Problem: Education: Goal: Knowledge of secondary prevention will improve Outcome: Not Progressing Goal: Knowledge of patient specific risk factors addressed and post discharge goals established will improve Outcome: Not Progressing Goal: Individualized Educational Video(s) Outcome: Not Progressing

## 2020-05-01 NOTE — Progress Notes (Addendum)
LVAD Coordinator Rounding Note:  HM III LVAD implanted on 03/19/2020 with MAZE procedure + LAA clipping  by Dr Orvan Seen under destination therapy criteria due to advanced age. Primary Heart Failure Cardiologist is Dr. Haroldine Laws.   Pt resting in bed. Would not arouse or interact with VAD coordinator other than to say "we always have to do this" when I placed cap and mask on for dressing change.   Transitioning pt over to palliative care.  Vital signs: Temp: 97.8 HR: 70 - afib Cuff: 113/84 (95) Doppler: 98 O2 Sat: 98% on RA Wt: 253.3>262.3>260.8>259.9>237.8.Marland Kitchen..217.5>224.8>218.7>221.7>216.7>220.4>214.5>216>214.1 lbs   LVAD interrogation reveals:  Speed: 5300 Flow: 3.8 Power: 3.7w  PI: 5.3  Alarms: none  Events: none Hematocrit: 25  Fixed speed: 5300 Low speed limit: 5000   Drive Line:  Existing VAD dressing removed and site care performed using sterile technique. Drive line exit site cleaned with Chlora prep applicators x 2, allowed to dry, and gauze dressing with silver strip re-applied. Exit site healing and unincorporated, the velour is fully implanted at exit site. Scant amount of serous drainage. No redness, tenderness, foul odor or rash noted. Drive line anchor secure. Dressing changes twice weekly (Monday / Thursday) dressing changes per nurse champion or VAD coordinator.  Next dressing change due 05/05/20.     Labs:  LDH trend: 360>513>482>...241>253>224>225>215>222>238>231>219>227>218>230   INR trend: 1.3>6.2>9>4.4>6.2>2.2.Marland Kitchen..2.1>2.4>2.1>2.2>2.4>2.6>3.0>1.0>1.7>1.9>1.9  CR: 3.57>3.98>3.18>2.44...4.54>6.3>4.34>6.4>7.14>8.32>5.59>4.17>5.52>6.5>7.41>4.57>5.51    Anticoagulation Plan: -INR Goal: 1.8 - 2.3  -ASA Dose: 81 mg - will continue due to hx of CVA per Dr. Haroldine Laws  Respiratory: - extubated 4/9/2 - re-intubated 03/13/2020 s/p left hemothorax with cardiac arrest - extubated 04/06/20  Renal:  - CVVH started 03/25/20 - Intermittent HD dialysis started 04/14/20 - 04/16/20  Tunneled HD cath placed  Neuro: - Right MCA CVA on 03/27/20  Nitric Oxide: off 03/23/20  Blood Products:  Intraop: 6 FFP  DDAVP  420 cell saver  03/25/2020>>4 FFP 03/21/20>> 1 RBC 03/22/20>>1 RBC  Intra-op: 04/07/2020>>2 PCs      2 FFP Post-op: 03/22/2020>>3 PC's, 2 FFP 04/14/20>>1 PC's 04/15/20>> 1 PC's 04/18/20>>1 PC's 04/27/20>>1 PC's  Cultures:  -03/25/20- blood cultures>>negative - 04/07/2020-blood cultures>>negative - 04/03/20 - respiratory culture>>neg   Device: -St [name] -Therapies: VF 194 - therapy on  Drips: - Milrinone 0.125 mcg/kg/min >> off 04/09/20 - Levo 1 mcg/min>> off 04/10/20 - Amiodarone 30 mg/hr-off - Tube feed: 50 ml/hr  Adverse Events on VAD: >>03/25/20 CVVH Started >>03/16/2020 left hemothorax; cardiac arrest >>04/14/20 HD  Pt Education:  1. No VAD education with patient this am. Refusing to interact with coordinator. Pt has been made palliative care.  2. No family at bedside.   Plan/Recommendations:  1. Call VAD coordinator for any equipment or drive line issues.  2. Dressing changes Monday / Thursday per nurse champion or VAD coordinator.    Emerson Monte RN [place] Coordinator  Office: [phone]  24/7 Pager: [phone]

## 2020-05-01 NOTE — Progress Notes (Signed)
CSW received call from patient's daughter Tammi Klippel from [place] Bobbi asked appropriate questions about patient's current health status. CSW shared conversations and medical decisions patient made yesterday regarding discontinuing dialysis. Daughter Tammi Klippel shared that she is not surprised as he "always said he did not want to live on machines and definitely does not want to go to a Nursing Home". Tammi Klippel shared that she will continue to work with Ander Purpura on patient care needs. CSW explained that she should come to visit patient sooner than later given the current situation. Tammi Klippel shared that she hopes to try and come tomorrow or Saturday. Bobbi verbalizes understanding of the gravity of the current situation. CSW continues to follow for supportive intervention. [name], [place], [place]

## 2020-05-01 NOTE — Progress Notes (Signed)
ANTICOAGULATION CONSULT NOTE  Pharmacy Consult for warfarin Indication: post op LVAD  / Aflutter  Allergies  Allergen Reactions  . Liraglutide Nausea And Vomiting  . Lisinopril Cough    Patient Measurements: Height: 6' (182.9 cm) Weight: 97.1 kg (214 lb 1.1 oz) IBW/kg (Calculated) : 77.6 Heparin Dosing Weight: 101.2 kg  Vital Signs: Temp: 97.8 F (36.6 C) (05/20 0735) Temp Source: Axillary (05/20 0735) BP: 113/84 (05/20 0735) Pulse Rate: 95 (05/20 0735)  Labs: Recent Labs    04/29/20 0250 04/29/20 0818 04/30/20 0709 05/01/20 0146  HGB  --  8.1*  --   --   HCT  --  25.6*  --   --   PLT  --  214  --   --   LABPROT 21.2*  --  21.1* 20.7*  INR 1.9*  --  1.9* 1.9*  CREATININE 7.41*  --  4.57* 5.51*    Estimated Creatinine Clearance: 15.1 mL/min (A) (by C-G formula based on SCr of 5.51 mg/dL (H)).   Assessment: 69 YOM with atrial fibrillation on Eliquis PTA s/p R & L heart cath found to have 3v disease s/p  LVAD  Implant 4/8. Warfarin initiated on 4/9 after discussing with MD.  CT on 4/15 - found to have large right MCA stroke. INR goal reduced 5/4 - asa81/atorv80  INR remains 1.9 at goal  Hgb remains low ~ 8, had PRBCs 5/16. No bleeding noted  Goal of Therapy:  INR 1.8-2.3 Monitor platelets by anticoagulation protocol: Yes   Plan:  -Warfarin 1 mg x 1 tonight  -Daily PT/INR   Marguerite Olea, [facility] Clinical Pharmacist Phone [phone]  05/01/2020 10:14 AM

## 2020-05-02 LAB — RENAL FUNCTION PANEL
Albumin: 2.3 g/dL — ABNORMAL LOW (ref 3.5–5.0)
Anion gap: 13 (ref 5–15)
BUN: 102 mg/dL — ABNORMAL HIGH (ref 8–23)
CO2: 26 mmol/L (ref 22–32)
Calcium: 8.3 mg/dL — ABNORMAL LOW (ref 8.9–10.3)
Chloride: 92 mmol/L — ABNORMAL LOW (ref 98–111)
Creatinine, Ser: 6.46 mg/dL — ABNORMAL HIGH (ref 0.61–1.24)
GFR calc Af Amer: 9 mL/min — ABNORMAL LOW (ref >60)
GFR calc non Af Amer: 8 mL/min — ABNORMAL LOW (ref >60)
Glucose, Bld: 114 mg/dL — ABNORMAL HIGH (ref 70–99)
Phosphorus: 5.3 mg/dL — ABNORMAL HIGH (ref 2.5–4.6)
Potassium: 4.1 mmol/L (ref 3.5–5.1)
Sodium: 131 mmol/L — ABNORMAL LOW (ref 135–145)

## 2020-05-02 LAB — GLUCOSE, CAPILLARY
Glucose-Capillary: 119 mg/dL — ABNORMAL HIGH (ref 70–99)
Glucose-Capillary: 96 mg/dL (ref 70–99)
Glucose-Capillary: 111 mg/dL — ABNORMAL HIGH (ref 70–99)

## 2020-05-02 LAB — LACTATE DEHYDROGENASE: LDH: 218 U/L — ABNORMAL HIGH (ref 98–192)

## 2020-05-02 LAB — MAGNESIUM: Magnesium: 2.8 mg/dL — ABNORMAL HIGH (ref 1.7–2.4)

## 2020-05-02 LAB — PROTIME-INR
INR: 2 — ABNORMAL HIGH (ref 0.8–1.2)
Prothrombin Time: 22.1 seconds — ABNORMAL HIGH (ref 11.4–15.2)

## 2020-05-02 MED ORDER — WARFARIN SODIUM 1 MG PO TABS
1.0000 mg | ORAL_TABLET | Freq: Once | ORAL | Status: DC
  Filled 2020-05-02: qty 1

## 2020-05-02 NOTE — Progress Notes (Signed)
Patient ID: [name] [name], male   DOB: 1951/03/17, 69 y.o.   MRN: [id number]    Advanced Heart Failure Rounding Note   Subjective:    - S/p HM3 VAD 4/8 w MAZE procedure + LAA clipping. - Extubated 4/9 - 4/12 LVAD speed increased to 5400 --> echo moderate-severe RV dysfunction, septum mildly left shifted, trivial pericardial effusion.  LVAD speed cut back to 5300.  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  - 4/20 developed hemothorax requiring emergent CT placement and eventual VATS - 4/21 EEG done given concerns for posturing and c/w moderate to severe diffuse encephalopathy. No seizures or epileptiform discharges were seen  - 04/03/20 Pump Stop --> controller change out. CT of head unchanged R MCA CVA - 04/06/20 Extubated - 5/1 CRRT stopped. Had first iHD 5/3.  - 5/4 1uRBC - 5/6 1u RBC -5/16 1uRBC  Awake and will follow commands but will not answer any questions. BUN and creatinine rising.   VAD Interrogation HMIII  Speed: 5300 Flow: 3.4 Power: 3.7w  PI: 6.7  Alarms: none  Events: none Hematocrit: 25  Fixed speed: 5300 Low speed limit: 5000   Objective:   Weight Range:  Vital Signs:   Temp:  [97.6 F (36.4 C)-97.9 F (36.6 C)] 97.6 F (36.4 C) (05/21 0720) Pulse Rate:  [64-74] 64 (05/21 0720) Resp:  [12-19] 12 (05/21 0720) BP: (107-143)/(81-110) 122/98 (05/21 0720) SpO2:  [95 %-99 %] 97 % (05/21 0720) Weight:  [98.3 kg] 98.3 kg (05/21 0332) Last BM Date: 05/02/20  Weight change: Filed Weights   04/30/20 0413 05/01/20 0553 05/02/20 0332  Weight: 98 kg 97.1 kg 98.3 kg    Intake/Output:   Intake/Output Summary (Last 24 hours) at 05/02/2020 1046 Last data filed at 05/02/2020 1008 Gross per 24 hour  Intake 130 ml  Output 175 ml  Net -45 ml    Maps 90s    Physical Exam: General:  Lying in bed awake and will follow commands but won't open eyes or interact HEENT: normal  Neck: supple. JVP to jaw  Carotids 2+ bilat; no bruits. No  lymphadenopathy or thryomegaly appreciated. Cor: LVAD hum.  Lungs: Clear. Abdomen: obese soft, nontender, non-distended. No hepatosplenomegaly. No bruits or masses. Good bowel sounds. Driveline site clean. Anchor in place.  Extremities: no cyanosis, clubbing, rash. Warm no edema  Neuro: alert & oriented x 3. No focal deficits. Moves all 4 without problem    Labs: Basic Metabolic Panel: Recent Labs  Lab 04/28/20 0228 04/28/20 0228 04/29/20 0250 04/29/20 0250 04/30/20 0709 05/01/20 0146 05/02/20 0242  NA 132*  --  131*  --  135 135 131*  K 4.3  --  4.6  --  4.7 4.8 4.1  CL 96*  --  92*  --  95* 94* 92*  CO2 25  --  23  --  28 26 26   GLUCOSE 123*  --  128*  --  125* 111* 114*  BUN 90*  --  114*  --  58* 78* 102*  CREATININE 6.50*  --  7.41*  --  4.57* 5.51* 6.46*  CALCIUM 8.4*   < > 8.4*   < > 8.6* 8.5* 8.3*  MG 2.5*  --  2.7*  --  2.5* 2.5* 2.8*  PHOS 4.0  --  5.1*  --  4.4 4.9* 5.3*   < > = values in this interval not displayed.    Liver Function Tests: Recent Labs  Lab 04/28/20  1610 04/29/20 0250 04/30/20 0709 05/01/20 0146 05/02/20 0242  ALBUMIN 2.2* 2.2* 2.3* 2.4* 2.3*   No results for input(s): LIPASE, AMYLASE in the last 168 hours. No results for input(s): AMMONIA in the last 168 hours.  CBC: Recent Labs  Lab 04/26/20 0259 04/27/20 0218 04/28/20 0228 04/29/20 0818  WBC 8.4 8.7 9.4 9.2  HGB 7.4* 7.1* 7.9* 8.1*  HCT 24.1* 23.0* 24.9* 25.6*  MCV 88.6 90.2 88.3 89.2  PLT 268 248 233 214    Cardiac Enzymes: No results for input(s): CKTOTAL, CKMB, CKMBINDEX, TROPONINI in the last 168 hours.  BNP: BNP (last 3 results) Recent Labs    04/02/20 2342 04/10/20 0132 04/17/20 0043  BNP 332.7* 267.2* 582.1*    ProBNP (last 3 results) No results for input(s): PROBNP in the last 8760 hours.    Other results:  Imaging: No results found.   Medications:     Scheduled Medications: . sodium chloride   Intravenous Once  . aspirin  81 mg Per Tube  Daily  . atorvastatin  80 mg Per Tube q1800  . bisacodyl  10 mg Oral Daily   Or  . bisacodyl  10 mg Rectal Daily  . carvedilol  3.125 mg Oral BID WC  . Chlorhexidine Gluconate Cloth  6 each Topical Q0600  . citalopram  10 mg Per Tube Daily  . darbepoetin (ARANESP) injection - NON-DIALYSIS  100 mcg Subcutaneous Q Tue-1800  . feeding supplement (PRO-STAT SUGAR FREE 64)  30 mL Per Tube BID  . influenza vaccine adjuvanted  0.5 mL Intramuscular Tomorrow-1000  . insulin aspart  0-24 Units Subcutaneous Q4H  . insulin aspart  6 Units Subcutaneous Q4H  . insulin glargine  24 Units Subcutaneous BID  . multivitamin  1 tablet Oral QHS  . pantoprazole sodium  40 mg Per Tube Daily  . sodium chloride flush  10-40 mL Intracatheter Q12H  . Warfarin - Pharmacist Dosing Inpatient   Does not apply q1600    Infusions: . sodium chloride Stopped (04/15/20 1731)  . feeding supplement (NEPRO CARB STEADY) 1,000 mL (05/01/20 0755)    PRN Medications: sodium chloride, acetaminophen, dextrose, heparin sodium (porcine), hydrALAZINE, levalbuterol, ondansetron (ZOFRAN) IV, oxyCODONE, promethazine, sodium chloride flush, sorbitol, traMADol   Assessment/Plan:   1. Acute on chronic systolic HF -> cardiogenic shock-> S/p HM3 LVAD - Echo 2016 EF 30-35% - Echo 1/21 EF 25% - Admitted with NYHA IV symptoms and AKI with attempts at diuresis. Initial co-ox 39% - Echo this admit: EF 10-15% moderate RV dysfunction - R/LHC cath 3/21 with severe 3v CAD and low output with CI 1.7.   - S/p HM3 VAD 4/8 - Patient continuing to refuse all therapies. Long discussion with SW team over past few days who knows patient and family well. Family has been involved and agree with plan. I do not think his lack of interaction is catatonic depression. He clearly stated his wishes numerous times that he does not want to continue like this - No further HD. Stop all non-essential meds. No further blood draws - As he becomes more uremic or  uncomfortable will start comfort drips.  - Palliative care involved   2. VAD - VAD interrogated personally. Parameters stable..  - INR today 2.0   3. CAD with unstable angina - No s/s ischemia  4. AKI on CKD 3a -> ESRD - He stopped dialysis as of 04/30/20   5. Severe Protein Malnutrition  - Albumin 2.2 - Tube feeds ongoing. Cans top. Pull Cor-trak  6.CVA/anoxic brain injury - 03/27/20 CT large posterior MCA ischemic infarct, possibly from atrial fibrillation.   Switching to comfort care. Stopped dialysis 04/30/20. Waiting on family for visit.  SW and Palliative Care appreciated.    Length of Stay: 21   Glori Bickers, MD  10:46 AM  05/02/2020

## 2020-05-02 NOTE — Progress Notes (Signed)
Patient refusing any PO meds, patient no longer has Cortrak.

## 2020-05-02 NOTE — Progress Notes (Signed)
LVAD Coordinator Rounding Note:  HM III LVAD implanted on 04/06/2020 with MAZE procedure + LAA clipping  by Dr Orvan Seen under destination therapy criteria due to advanced age. Primary Heart Failure Cardiologist is Dr. Haroldine Laws.   Pt laying in bed with his eyes closed. States "I'm fine" and wont answer any other questions.   Patient is now palliative care.   Vital signs: Temp: 97.6 HR: 68 - afib Cuff: 122/98 (108) Doppler: 102 O2 Sat: 97% on RA Wt: 253.3>262.3>260.8>259.9>237.8.Marland Kitchen..217.5>224.8>218.7>221.7>216.7>220.4>214.5>216>214.1> 216.7 lbs   LVAD interrogation reveals:  Speed: 5300 Flow: 3.4 Power: 3.7w  PI: 6.7  Alarms: none  Events: none Hematocrit: 25  Fixed speed: 5300 Low speed limit: 5000   Drive Line:  Existing VAD dressing clean, dry, intact. Drive line anchor secure. Dressing changes twice weekly (Monday / Thursday) dressing changes per nurse champion or VAD coordinator.  Next dressing change due 05/05/20.   Labs:  LDH trend: 360>513>482>...241>253>224>225>215>222>238>231>219>227>218>230>218   INR trend: 1.3>6.2>9>4.4>6.2>2.2.Marland Kitchen..2.1>2.4>2.1>2.2>2.4>2.6>3.0>1.0>1.7>1.9>1.9>2.0  CR: 3.57>3.98>3.18>2.44...4.54>6.3>4.34>6.4>7.14>8.32>5.59>4.17>5.52>6.5>7.41>4.57>5.51>6.46    Anticoagulation Plan: -INR Goal: 1.8 - 2.3  -ASA Dose: 81 mg - will continue due to hx of CVA per Dr. Haroldine Laws  Respiratory: - extubated 4/9/2 - re-intubated 04/08/2020 s/p left hemothorax with cardiac arrest - extubated 04/06/20  Renal:  - CVVH started 03/25/20 - Intermittent HD dialysis started 04/14/20 - 04/16/20 Tunneled HD cath placed  Neuro: - Right MCA CVA on 03/27/20  Nitric Oxide: off 03/23/20  Blood Products:  Intraop: 6 FFP  DDAVP  420 cell saver  04/05/2020>>4 FFP 03/21/20>> 1 RBC 03/22/20>>1 RBC  Intra-op: 03/13/2020>>2 PCs      2 FFP Post-op: 04/05/2020>>3 PC's, 2 FFP 04/14/20>>1 PC's 04/15/20>> 1 PC's 04/18/20>>1 PC's 04/27/20>>1 PC's  Cultures:  -03/25/20- blood  cultures>>negative - 04/07/2020-blood cultures>>negative - 04/03/20 - respiratory culture>>neg   Device: -St [name] -Therapies: VF 194 - therapy on  Drips: - Milrinone 0.125 mcg/kg/min >> off 04/09/20 - Levo 1 mcg/min>> off 04/10/20 - Amiodarone 30 mg/hr-off - Tube feed: 50 ml/hr  Adverse Events on VAD: >>03/25/20 CVVH Started >>03/26/2020 left hemothorax; cardiac arrest >>04/14/20 HD  Pt Education:  1. No VAD education with patient this am. Refusing to interact with coordinator. Pt has been made palliative care.  2. No family at bedside.   Plan/Recommendations:  1. Call VAD coordinator for any equipment or drive line issues.  2. Dressing changes Monday / Thursday per nurse champion or VAD coordinator.    Emerson Monte RN [place] Coordinator  Office: [phone]  24/7 Pager: [phone]

## 2020-05-02 NOTE — Progress Notes (Signed)
SLP Cancellation Note  Patient Details Name: [name] [name] MRN: [id number] DOB: 1951-03-17   Cancelled treatment:       Reason Eval/Treat Not Completed: Other (comment)(Pt is now on comfort care. )  Tobie Poet I. Hardin Negus, [place], [place] Office number [phone] Pager [place] 05/02/2020, 1:46 PM

## 2020-05-02 NOTE — Progress Notes (Signed)
CSW visited bedside and patient was sleeping. CSW unable to get a response from patient at the time of visit. CSW spoke with daughter Tammi Klippel via phone who reports she is working on [employer]. She states that she has been making payments on a plot for patient and notified the funeral home in [place] of impending expiration and inquired about cost for transporting the body back to [place] Tammi Klippel states that she and [name] have been in communication with one another and working together on plans for patient. Bobbi hopes to come visit bedside tomorrow.  CSW also spoke with Ander Purpura who states she is on her way to the hospital now. [name] shard that she and Bobbi have spoken about patient's current state and plans for funeral arrangements. CSW provided supportive intervention and will continue to be available as needed. [name], [place], [place]

## 2020-05-02 NOTE — Progress Notes (Signed)
OT Cancellation Note  Patient Details Name: [name] [name] MRN: [id number] DOB: 1951-11-13   Cancelled Treatment:    Reason Eval/Treat Not Completed: Other (comment). OT order discontinued due to patient now on comfort care (pt has opted to stop dialysis).  [place], OTR/L Acute Rehab Services Pager [phone] Office [phone]     Almon Register 05/02/2020, 11:59 AM

## 2020-05-02 NOTE — Plan of Care (Signed)
  Problem: Education: Goal: Knowledge of General Education information will improve Description: Including pain rating scale, medication(s)/side effects and non-pharmacologic comfort measures Outcome: Not Progressing   Problem: Health Behavior/Discharge Planning: Goal: Ability to manage health-related needs will improve Outcome: Not Progressing   Problem: Clinical Measurements: Goal: Ability to maintain clinical measurements within normal limits will improve Outcome: Not Progressing Goal: Will remain free from infection Outcome: Not Progressing Goal: Diagnostic test results will improve Outcome: Not Progressing Goal: Respiratory complications will improve Outcome: Not Progressing Goal: Cardiovascular complication will be avoided Outcome: Not Progressing   Problem: Nutrition: Goal: Adequate nutrition will be maintained Outcome: Not Progressing   Problem: Elimination: Goal: Will not experience complications related to bowel motility Outcome: Not Progressing   Problem: Safety: Goal: Ability to remain free from injury will improve Outcome: Not Progressing   Problem: Skin Integrity: Goal: Risk for impaired skin integrity will decrease Outcome: Not Progressing   Problem: Education: Goal: Knowledge of the prescribed therapeutic regimen will improve Outcome: Not Progressing   Problem: Activity: Goal: Risk for activity intolerance will decrease Outcome: Not Progressing   Problem: Cardiac: Goal: Ability to maintain an adequate cardiac output will improve Outcome: Not Progressing   Problem: Coping: Goal: Level of anxiety will decrease Outcome: Not Progressing   Problem: Fluid Volume: Goal: Risk for excess fluid volume will decrease Outcome: Not Progressing   Problem: Clinical Measurements: Goal: Ability to maintain clinical measurements within normal limits will improve Outcome: Not Progressing Goal: Will remain free from infection Outcome: Not Progressing    Problem: Respiratory: Goal: Will regain and/or maintain adequate ventilation Outcome: Not Progressing   Problem: Education: Goal: Knowledge of secondary prevention will improve Outcome: Not Progressing Goal: Knowledge of patient specific risk factors addressed and post discharge goals established will improve Outcome: Not Progressing Goal: Individualized Educational Video(s) Outcome: Not Progressing

## 2020-05-02 NOTE — Progress Notes (Signed)
ANTICOAGULATION CONSULT NOTE  Pharmacy Consult for warfarin Indication: post op LVAD  / Aflutter  Allergies  Allergen Reactions  . Liraglutide Nausea And Vomiting  . Lisinopril Cough    Patient Measurements: Height: 6' (182.9 cm) Weight: 98.3 kg (216 lb 11.4 oz) IBW/kg (Calculated) : 77.6 Heparin Dosing Weight: 101.2 kg  Vital Signs: Temp: 98.5 F (36.9 C) (05/21 1104) Temp Source: Oral (05/21 1104) BP: 106/76 (05/21 1104) Pulse Rate: 67 (05/21 1104)  Labs: Recent Labs    04/30/20 0709 05/01/20 0146 05/02/20 0242  LABPROT 21.1* 20.7* 22.1*  INR 1.9* 1.9* 2.0*  CREATININE 4.57* 5.51* 6.46*    Estimated Creatinine Clearance: 12.9 mL/min (A) (by C-G formula based on SCr of 6.46 mg/dL (H)).   Assessment: [name] with atrial fibrillation on Eliquis PTA s/p R & L heart cath found to have 3v disease s/p  LVAD  Implant 4/8. Warfarin initiated on 4/9 after discussing with MD.  CT on 4/15 - found to have large right MCA stroke. INR goal reduced 5/4 - asa81/atorv80  INR 2, at goal  Hgb remains low ~ 8, had PRBCs 5/16. No bleeding noted  Goal of Therapy:  INR 1.8-2.3 Monitor platelets by anticoagulation protocol: Yes   Plan:  -Warfarin 1 mg x 1 again tonight  -Daily PT/INR   [name] [name], [facility] Clinical Pharmacist Phone [phone]  05/02/2020 3:15 PM

## 2020-05-03 LAB — BASIC METABOLIC PANEL
Anion gap: 15 (ref 5–15)
BUN: 126 mg/dL — ABNORMAL HIGH (ref 8–23)
CO2: 27 mmol/L (ref 22–32)
Calcium: 8.7 mg/dL — ABNORMAL LOW (ref 8.9–10.3)
Chloride: 92 mmol/L — ABNORMAL LOW (ref 98–111)
Creatinine, Ser: 7.95 mg/dL — ABNORMAL HIGH (ref 0.61–1.24)
GFR calc Af Amer: 7 mL/min — ABNORMAL LOW (ref >60)
GFR calc non Af Amer: 6 mL/min — ABNORMAL LOW (ref >60)
Glucose, Bld: 74 mg/dL (ref 70–99)
Potassium: 5.2 mmol/L — ABNORMAL HIGH (ref 3.5–5.1)
Sodium: 134 mmol/L — ABNORMAL LOW (ref 135–145)

## 2020-05-03 LAB — PROTIME-INR
INR: 2.3 — ABNORMAL HIGH (ref 0.8–1.2)
Prothrombin Time: 24.5 seconds — ABNORMAL HIGH (ref 11.4–15.2)

## 2020-05-03 MED ORDER — WARFARIN SODIUM 1 MG PO TABS
1.0000 mg | ORAL_TABLET | Freq: Once | ORAL | Status: AC
  Administered 2020-05-03: 1 mg via ORAL
  Filled 2020-05-03: qty 1

## 2020-05-03 NOTE — Progress Notes (Signed)
ANTICOAGULATION CONSULT NOTE  Pharmacy Consult for warfarin Indication: post op LVAD  / Aflutter  Allergies  Allergen Reactions  . Liraglutide Nausea And Vomiting  . Lisinopril Cough    Patient Measurements: Height: 6' (182.9 cm) Weight: 98.6 kg (217 lb 6 oz) IBW/kg (Calculated) : 77.6 Heparin Dosing Weight: 101.2 kg  Vital Signs: Temp: 97.5 F (36.4 C) (05/22 1046) Temp Source: Axillary (05/22 1046) BP: 114/89 (05/22 1203) Pulse Rate: 83 (05/22 1046)  Labs: Recent Labs    05/01/20 0146 05/02/20 0242  LABPROT 20.7* 22.1*  INR 1.9* 2.0*  CREATININE 5.51* 6.46*    Estimated Creatinine Clearance: 12.9 mL/min (A) (by C-G formula based on SCr of 6.46 mg/dL (H)).   Assessment: 69 YOM with atrial fibrillation on Eliquis PTA s/p R & L heart cath found to have 3v disease s/p  LVAD  Implant 4/8. Warfarin initiated on 4/9 after discussing with MD.  CT on 4/15 - found to have large right MCA stroke. INR goal reduced 5/4 - asa81/atorv80  INR 2 yeterday, at goal. Patient refused warfarin last night. No CBC today. No bleeding noted.  Goal of Therapy:  INR 1.8-2.3 Monitor platelets by anticoagulation protocol: Yes   Plan:  -Warfarin 1 mg x 1 again tonight  -Daily PT/INR  Vertis Kelch, PharmD, [facility] PGY2 Cardiology Pharmacy Resident Phone [phone] 05/03/2020       12:20 PM  Please check [email or web address] for unit-specific pharmacist phone numbers

## 2020-05-03 NOTE — Progress Notes (Signed)
refusing all doppler pressures today. Told RN to "go to hell" when doppler pressure was attempted.

## 2020-05-03 NOTE — Progress Notes (Signed)
Pt's daughter at bedside.  Pt refused Temperature and dopplers at this time.  Will continue to monitor.  Saunders Revel T

## 2020-05-03 NOTE — Progress Notes (Addendum)
Patient ID: [name] [name], male   DOB: 11/17/1951, 68 y.o.   MRN: [id number]    Advanced Heart Failure Rounding Note   Subjective:    Awake but refusing to interact. Refusing all meds. Cor-trak out. Off TFs.   Made 1 liter of urine yesterday. Much less today.  Weight stable. No labs drawn.   VAD Interrogation HMIII  Speed: 5300 Flow: 3.2 Power: 3.8w  PI: 9.0  Fixed speed: 5300 Low speed limit: 5000   Objective:   Weight Range:  Vital Signs:   Temp:  [97.5 F (36.4 C)-98.5 F (36.9 C)] 97.5 F (36.4 C) (05/22 1046) Pulse Rate:  [54-84] 83 (05/22 1046) Resp:  [11-17] 16 (05/22 1046) BP: (103-126)/(89-111) 114/89 (05/22 1203) SpO2:  [95 %-98 %] 95 % (05/22 1046) Weight:  [98.6 kg] 98.6 kg (05/22 0336) Last BM Date: 05/02/20  Weight change: Filed Weights   05/01/20 0553 05/02/20 0332 05/03/20 0336  Weight: 97.1 kg 98.3 kg 98.6 kg    Intake/Output:   Intake/Output Summary (Last 24 hours) at 05/03/2020 1215 Last data filed at 05/03/2020 0335 Gross per 24 hour  Intake 240 ml  Output 700 ml  Net -460 ml    Maps 90s    Physical Exam: General:  Lying in bed awake and will follow commands but won't open eyes or interact HEENT: normal  Neck: supple. JVP to jaw  Carotids 2+ bilat; no bruits. No lymphadenopathy or thryomegaly appreciated. Cor: LVAD hum.  Lungs: Clear. Abdomen: obese soft, nontender, non-distended. No hepatosplenomegaly. No bruits or masses. Good bowel sounds. Driveline site clean. Anchor in place.  Extremities: no cyanosis, clubbing, rash. Warm no tr edema  Condom cath pulled off and lying on the floor  Neuro: alert & oriented x 3. No focal deficits. Moves all 4 without problem    Labs: Basic Metabolic Panel: Recent Labs  Lab 04/28/20 0228 04/28/20 0228 04/29/20 0250 04/29/20 0250 04/30/20 0709 05/01/20 0146 05/02/20 0242  NA 132*  --  131*  --  135 135 131*  K 4.3  --  4.6  --  4.7 4.8 4.1  CL 96*  --  92*  --  95* 94* 92*  CO2  25  --  23  --  28 26 26   GLUCOSE 123*  --  128*  --  125* 111* 114*  BUN 90*  --  114*  --  58* 78* 102*  CREATININE 6.50*  --  7.41*  --  4.57* 5.51* 6.46*  CALCIUM 8.4*   < > 8.4*   < > 8.6* 8.5* 8.3*  MG 2.5*  --  2.7*  --  2.5* 2.5* 2.8*  PHOS 4.0  --  5.1*  --  4.4 4.9* 5.3*   < > = values in this interval not displayed.    Liver Function Tests: Recent Labs  Lab 04/28/20 0228 04/29/20 0250 04/30/20 0709 05/01/20 0146 05/02/20 0242  ALBUMIN 2.2* 2.2* 2.3* 2.4* 2.3*   No results for input(s): LIPASE, AMYLASE in the last 168 hours. No results for input(s): AMMONIA in the last 168 hours.  CBC: Recent Labs  Lab 04/27/20 0218 04/28/20 0228 04/29/20 0818  WBC 8.7 9.4 9.2  HGB 7.1* 7.9* 8.1*  HCT 23.0* 24.9* 25.6*  MCV 90.2 88.3 89.2  PLT 248 233 214    Cardiac Enzymes: No results for input(s): CKTOTAL, CKMB, CKMBINDEX, TROPONINI in the last 168 hours.  BNP: BNP (last 3 results) Recent Labs    04/02/20 2342 04/10/20 0132  04/17/20 0043  BNP 332.7* 267.2* 582.1*    ProBNP (last 3 results) No results for input(s): PROBNP in the last 8760 hours.    Other results:  Imaging: No results found.   Medications:     Scheduled Medications: . sodium chloride   Intravenous Once  . warfarin  1 mg Oral ONCE-1600  . Warfarin - Pharmacist Dosing Inpatient   Does not apply q1600    Infusions: . sodium chloride Stopped (04/15/20 1731)    PRN Medications: sodium chloride, acetaminophen, ondansetron (ZOFRAN) IV, promethazine, sodium chloride flush   Assessment/Plan:   1. Acute on chronic systolic HF -> cardiogenic shock-> S/p HM3 LVAD - Echo 2016 EF 30-35% - Echo 1/21 EF 25% - Admitted with NYHA IV symptoms and AKI with attempts at diuresis. Initial co-ox 39% - Echo this admit: EF 10-15% moderate RV dysfunction - R/LHC cath 3/21 with severe 3v CAD and low output with CI 1.7.   - S/p HM3 VAD 4/8 - Patient continuing to refuse all therapies. Long discussion  with SW team over past few days who knows patient and family well. Family has been involved and agree with plan. I do not think his lack of interaction is catatonic depression. He clearly stated his wishes numerous times that he does not want to continue like this - No further HD. Stop all non-essential meds. - Creatinine up to 6.5 yesterday but now having increasing urine output suggestive of renal recovery.  - Will check BMET to assess BUN/Cr - If he can be maintained off HD then we will have to re-discuss his [place].  - If he becomes more uremic or uncomfortable will start comfort drips.  - Palliative care involved   2. VAD - VAD interrogated personally. Parameters stable.  - INR not drawn. Refusing warfarin  3. CAD with unstable angina - No s/s ischemia  4. AKI on CKD 3a -> ESRD - He stopped dialysis as of 04/30/20  - Creatinine up to 6.5 yesterday but now having increasing urine output suggestive of renal recovery.  - Will check BMET to assess BUN/Cr - If he can be maintained off HD then we will have to re-discuss his [place].  - If he becomes more uremic or uncomfortable will start comfort drips.   5. Severe Protein Malnutrition  - TFs stopped  6.CVA/anoxic brain injury - 03/27/20 CT large posterior MCA ischemic infarct, possibly from atrial fibrillation.   Plan as above.   Length of Stay: [place], [place]  12:15 PM  05/03/2020

## 2020-05-04 MED ORDER — MIDAZOLAM HCL 2 MG/2ML IJ SOLN
5.0000 mg | Freq: Once | INTRAMUSCULAR | Status: AC
  Administered 2020-05-04: 5 mg via INTRAVENOUS

## 2020-05-04 MED ORDER — MIDAZOLAM HCL 2 MG/2ML IJ SOLN
INTRAMUSCULAR | Status: AC
  Filled 2020-05-04: qty 6

## 2020-05-04 MED ORDER — MORPHINE SULFATE (PF) 4 MG/ML IV SOLN: 5.0000 mg | INTRAVENOUS | Status: DC | PRN

## 2020-05-04 MED ORDER — MORPHINE SULFATE 4 MG/ML IJ SOLN: 5.0000 mg | Freq: Once | INTRAMUSCULAR | Status: DC

## 2020-05-04 MED ORDER — MORPHINE 100MG IN NS 100ML (1MG/ML) PREMIX INFUSION
5.0000 mg/h | INTRAVENOUS | Status: DC
  Administered 2020-05-04 (×2): 5 mg/h via INTRAVENOUS
  Filled 2020-05-04 (×2): qty 100

## 2020-05-04 MED ORDER — WARFARIN SODIUM 1 MG PO TABS: 1.0000 mg | ORAL_TABLET | Freq: Once | ORAL | Status: DC

## 2020-05-04 MED ORDER — WARFARIN 0.5 MG HALF TABLET
0.5000 mg | ORAL_TABLET | Freq: Once | ORAL | Status: DC
  Filled 2020-05-04: qty 1

## 2020-05-05 ENCOUNTER — Telehealth (HOSPITAL_COMMUNITY): Payer: Self-pay | Admitting: Licensed Clinical Social Worker

## 2020-05-05 NOTE — Telephone Encounter (Signed)
Patient's daughter Tammi Klippel called multiple times over the weekend to ask questions about health status and end of life with patient. She shared pending funeral plans and having patient's body brought back to [place] where he already has a plot. Tammi Klippel shared conversations with her father from bedside visit on Saturday and so grateful she was bale to be on video at the end. CSW provided supportive intervention and left cont cat info if further needs arise. [name], [place], [place]

## 2020-05-05 NOTE — Telephone Encounter (Signed)
CSW contacted step daughter Ander Purpura to offer support. [name] stated she was at bedside and glad to be able to hold his hand in the end. CSW provided supportive intervention and available to the family if additional needs arise. [name], [place], [place]

## 2020-05-07 LAB — GLUCOSE, CAPILLARY: Glucose-Capillary: 17 mg/dL — CL (ref 70–99)

## 2020-05-13 NOTE — Progress Notes (Signed)
Patient ID: ADYEN BIFULCO, male   DOB: Dec 06, 1951, 69 y.o.   MRN: [id number]    This NP visited patient at the bedside as a follow up for palliative medicine needs and emotional support.   I have been working with Mr. Migues again and his family during this hospitalization and did an initial Palliative assessment on /03-19-20 prior to LVAD surgery.  Hospital course has been challenging and difficult for Mr. Armentrout.  Recently patient has refused all therapies.       Prior to LVAD implantation patient was clear that the rationale for moving forward with the surgery was for quality of life.  He made it known that he would never want to live in a nursing home, be a burden to others, or live on any life-prolonging machines.     Prior to LVAD surgery patient was clear that if he could not make decisions for himself that he would want his stepdaughter/[name] to be his main decision maker.  Patient has been verbalizing his desire to be left alone, continues to get irritated with attempts on basic medical assessments.  He is more lethargic today.  Hemodialysis was stopped last week, and the plan was once patient became uremic or uncomfortable comfort drips would be initiated.  Morphine drip to be started today to enhance comfort and dignity.  I  spoke by phone with with [name]/ step daughter/main support person here in [place] for ongoing discussion current medical situation, and to answer any questions or concerns.  We discussed the initiation of the morphine drip and Larin agrees, "I just want him to be comfortable".  [name] plans to visit today around 1 PM.  I discussed case with Dr. Haroldine Laws and he anticipates likely termination of the LVAD sometime tomorrow.  Questions and concerns addressed    Emotional support offered    PMT will continue to support holistically   Total time spent on the unit was 35  minutes    Discussed with Dr Haroldine Laws  Greater than 50% of the time was  spent in counseling and coordination of care  Wadie Lessen NP  Palliative Medicine Team Team Phone # [phone] Pager  [phone]

## 2020-05-13 NOTE — Discharge Summary (Addendum)
Advanced Heart Failure Death Summary  Death Summary   Patient ID: [name] [name] MRN: [id number], DOB/AGE: [birth year] 69 y.o. 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 Troponin mildly elevated at 0.06. He was transferred to [facility] for cardiac cath. His cardiac catheterization showed severe 3v CAD with low output. Echo with EF 10-15%. Moderate RV dysfunction   Plan was for initially for CABG but developed AKI with attempts at diuresis.  Due progressive heart failure/cardiogenic shock he was worked up for VAD and deemed appropriate for durable mechanical support. On 03/26/2020 had HMIII LVAD with left atrial appendage clipping. Post operative course complicated by AKI requiring dialysis, respiratory failure, anemia, and stroke.   He was able to wean from all pressors but developed AKI. Placed on CRRT and later transitioned to [facility]. Initially he was agreeable to [facility] but later refused to partipate in all care.    PT/OT followed with recommendations for SNF but patient stopped participating with therapy and said he never wanted this in first place.  Dr [name] [name], HFSW, and Palliative Care met with him and his daughter on multiple occasions and on 05/01/20 he elected to transition to comfort care.    HF/LVAD SW met with his daughter on 05/01/20. Daughter [name] [name] shared that she is not surprised as he "always said he did not want to live on machines and definitely does not want to go to a Nursing Home".  He continued to tell medical staff he wanted to be left alone to die. Dialysis was stopped on 04/30/20 . He became uremic and was placed on comfort drips.  He passed on 2020/05/20.    For additional details see progress notes.   Significant Events  4/8: redo sternotomy, LVAD implantation, left atrial appendage clipping, and sternal reconstruction  4/9: extubated 4/13 CRRT started  4/15 R MCA CVA on [facility] 4/16 off [place], on 5L [place] 4/19 CRRT stopped, respiratory arrest, intubated, chest tube placed for L hemothorax  4/20 L VATS with 2 pleural drains placed   4/21 EEG done given concerns for posturing and c/w moderate to severe diffuse encephalopathy. No seizures or epileptiform discharges were seen  - 04/03/20 Pump Stop --> controller change out. CT of head unchanged R MCA CVA - 04/06/20 Extubated - 04/12/20 CRRT stopped started iHD on 5/3 - 04/30/20 Stopped iHD refused to continue    Significant Diagnostic Studies CT Head 03/27/20 IMPRESSION: Large posterior right MCA infarct, new since prior study.   CT a/p 03/25/2020 IMPRESSION: 1. No acute intra-abdominal or pelvic pathology. No bowel obstruction. Normal appendix. 2. Cirrhosis with small perihepatic ascites. 3. Cholelithiasis. No evidence of acute cholecystitis by CT. 4. Aortic Atherosclerosis (ICD10-I70.0).   Echo 03/22/2020   1. Images are poor quality and cannot comment on global LV function. .  Left ventricular  ejection fraction, by estimation, is 20 to 25%. The left  ventricle has severely decreased function. The left ventricle demonstrates  global hypokinesis. There is  moderate concentric left ventricular hypertrophy.   2. Aortic dilatation noted. There is mild dilatation of the ascending  aorta and of the aortic root measuring  41 mm and 21m respectively.   3. Right ventricular systolic function is severely reduced. The right  ventricular size is moderately enlarged.   4. The aortic valve is tricuspid. Mild aortic valve sclerosis is present,  with no evidence of aortic valve stenosis.   LHC/RHC 03/03/2020  1. Severe triple vessel CAD 2. The LAD wraps the apex. The LAD has a patent mid stented segment with moderate restenosis. The remainder of the mid and distal LAD has diffuse moderate to severe stenosis. There are two moderate caliber diagonal branches that arise from the mid LAD. The first diagonal branch has a patent proximal stent and then severe mid stenosis. The second diagonal branch has diffuse severe stenosis.  3. The Circumflex has moderate proximal stenosis. The large obtuse marginal that has been stented is now occluded and fills from left to right collaterals.  4. The RCA is a large dominant vessel with a large PDA and moderate caliber posterolateral artery. The PDA has a severe ostial stenosis. The posterolateral artery has severe proximal stenosis and total mid occlusion.  5. Elevated wedge pressure.  6. Cardiac output 3.88, CI 1.71  RA 13 PA 41/23 (33) PCWP 23 Fick 3.9/1.7 PaPI 1.4 RA/PCWP  0.56   Consultants Cardiac Surgery  PCCM Nephrology  Neurology  Palliative Care Speech Therapy  Hematology  Dentist    Duration of Discharge Encounter: Greater than 35 minutes   Signed, [name] Grinder NP 05/07/2020, 3:49 PM  Agree with above. Patient alert and oriented but refusing all care or to interact with medical team in a meaningful way. Repeatedly telling the nurses to "go to hell." Multiple discussions with him and family to determine best plan of care. He then began to refuse HD and passed due to progressive uremia.   [name] Bickers MD  9:51 AM

## 2020-05-13 NOTE — Progress Notes (Signed)
Refusing AM doppler pressure "doesn't want to hear it"

## 2020-05-13 NOTE — Progress Notes (Addendum)
ANTICOAGULATION CONSULT NOTE  Pharmacy Consult for warfarin Indication: post op LVAD  / Aflutter  Allergies  Allergen Reactions  . Liraglutide Nausea And Vomiting  . Lisinopril Cough    Patient Measurements: Height: 6' (182.9 cm) Weight: 98.6 kg (217 lb 6 oz) IBW/kg (Calculated) : 77.6 Heparin Dosing Weight: 101.2 kg  Vital Signs: Temp: 97.7 F (36.5 C) (05/23 0410) Temp Source: Axillary (05/23 0410) BP: 124/92 (05/23 0727) Pulse Rate: 86 (05/23 0410)  Labs: Recent Labs    05/02/20 0242 05/03/20 1645  LABPROT 22.1* 24.5*  INR 2.0* 2.3*  CREATININE 6.46* 7.95*    Estimated Creatinine Clearance: 10.5 mL/min (A) (by C-G formula based on SCr of 7.95 mg/dL (H)).   Assessment: 17 YOM with atrial fibrillation on Eliquis PTA s/p R & L heart cath found to have 3v disease s/p  LVAD  Implant 4/8. Warfarin initiated on 4/9 after discussing with MD.  CT on 4/15 - found to have large right MCA stroke. INR goal reduced 5/4 - asa81/atorv80  INR 2 yeterday, at goal. Patient refused warfarin on 5/21. He did get his dose last night.  No CBC today. No bleeding noted.  Goal of Therapy:  INR 1.8-2.3 Monitor platelets by anticoagulation protocol: Yes   Plan:  -Warfarin 0.5 mg x 1 tonight  -Will check INR q72h  Vertis Kelch, PharmD, [facility] PGY2 Cardiology Pharmacy Resident Phone [phone] May 30, 2020       9:51 AM  Please check [email or web address] for unit-specific pharmacist phone numbers

## 2020-05-13 NOTE — Progress Notes (Signed)
Pt had been started on Morphine drip this morning per palliative care. Received call from patient's nurse stating that patient was unresponsive, respirations had dropped to 6 per minutes, O2 sats were in the 70s, HR 100. She updated patient's daughters, Ander Purpura and Tammi Klippel, of patient status and requested [name] come to patient's bedside for withdrawal of support. Dr Haroldine Laws aware and in agreement with turning off LVAD support.   Patient's daughters Ander Purpura and Tammi Klippel (on Elink camera) present for discontinuation of support. Patient given 5 mg Versed and 5 mg Morphine bolus prior to discontinuation of LVAD support. LVAD turned off at [place]. Magnet placed over ICD. Patient's RN at bedside to manage Morphine drip for patient comfort.    Emerson Monte RN [place] Coordinator  Office: [phone]  24/7 Pager: [phone]
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
## 2020-05-13 NOTE — Progress Notes (Signed)
Assisted tele visit to patient with family member.  Harlan Ervine M, RN  

## 2020-05-13 DEATH — deceased

## 2020-12-16 IMAGING — DX DG ABDOMEN 1V
1 series · 1 of 1 positions shown · non-contrast
Comparison: March 26, 2020.

CLINICAL DATA: Orogastric tube placement.

EXAM:
ABDOMEN - 1 VIEW

[abdomen kub]
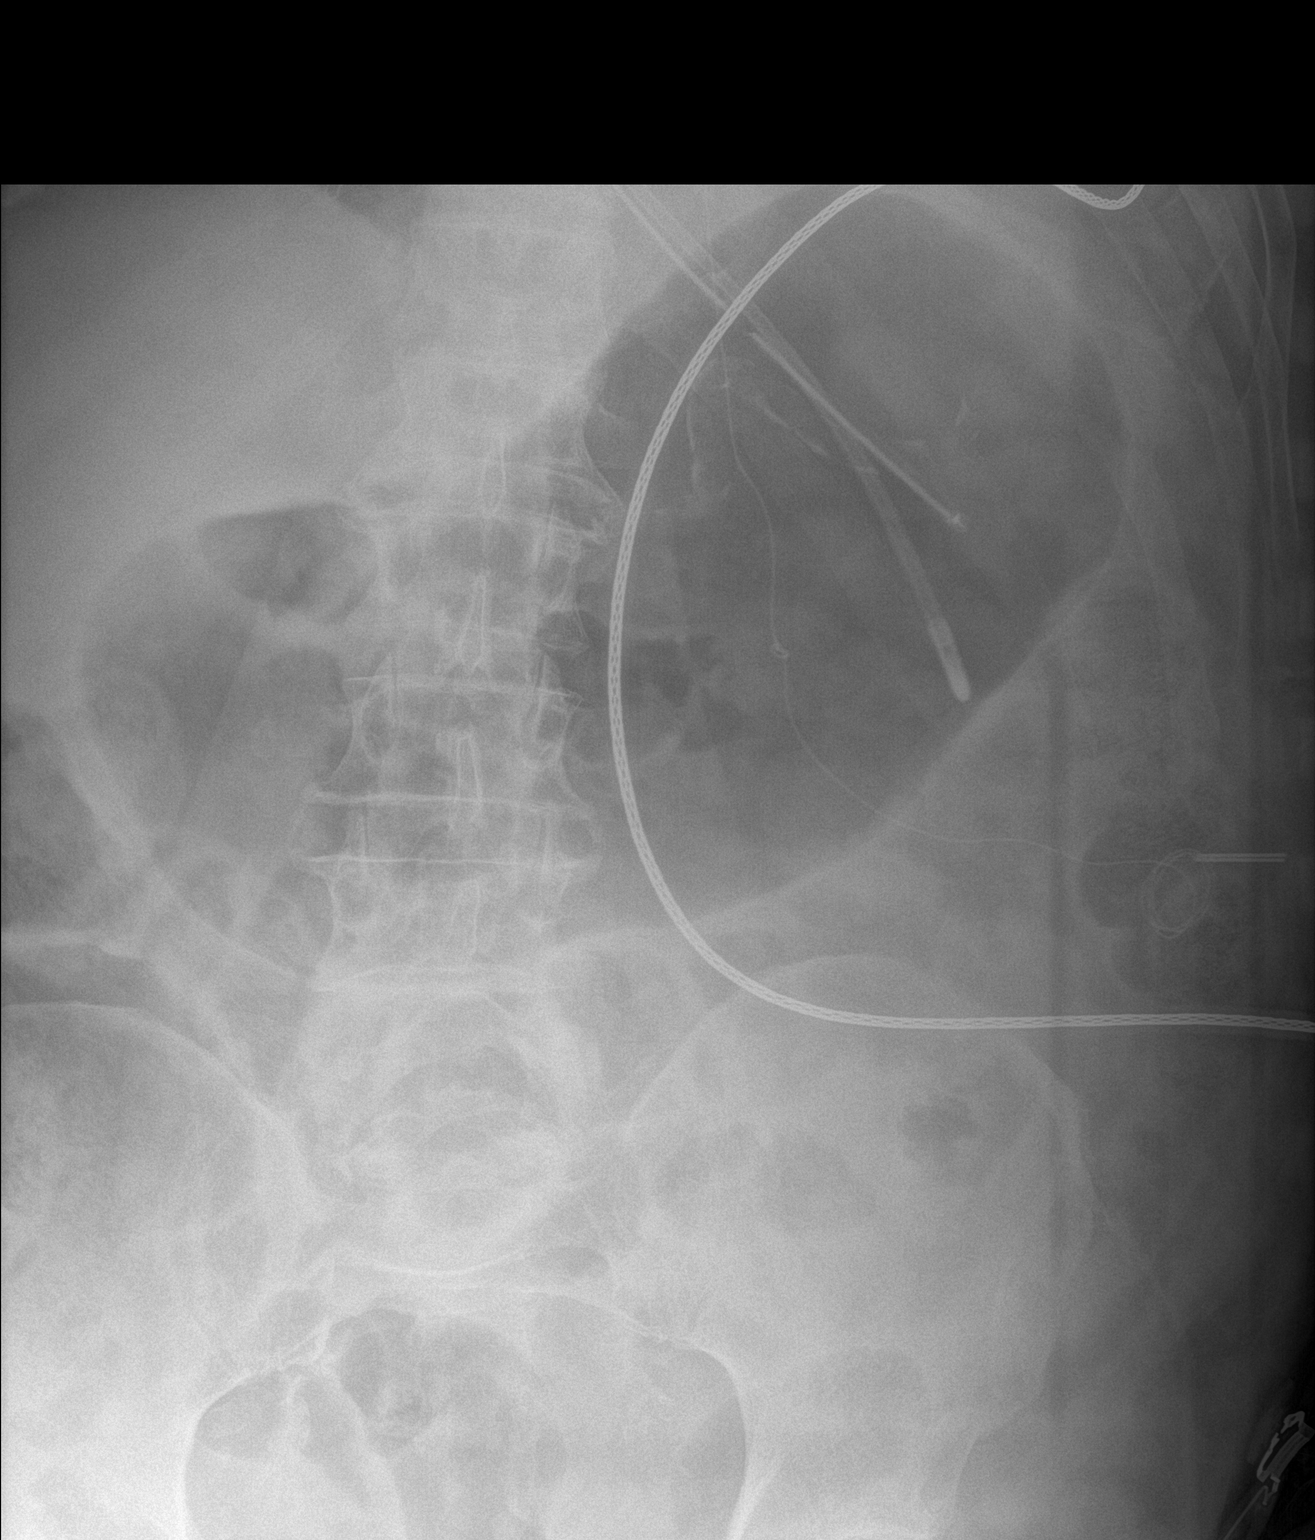

[1 of 1 positions shown; findings below may reference images not displayed]

FINDINGS: Moderately distended air-filled stomach is noted. No large or small
bowel dilatation is noted. Distal tips of nasogastric tube and
feeding tube are seen in the proximal stomach. No radio-opaque
calculi or other significant radiographic abnormality are seen.
IMPRESSION: Distal tips of nasogastric tube and feeding tube are seen in the
proximal stomach. Moderate gastric distention is noted.

## 2020-12-16 IMAGING — DX DG CHEST 1V
1 series · 1 of 1 positions shown · non-contrast
Comparison: [DATE] hours prior. CT 03/12/2020

CLINICAL DATA: Endotracheally intubated.

EXAM:
CHEST  1 VIEW

[chest ap]
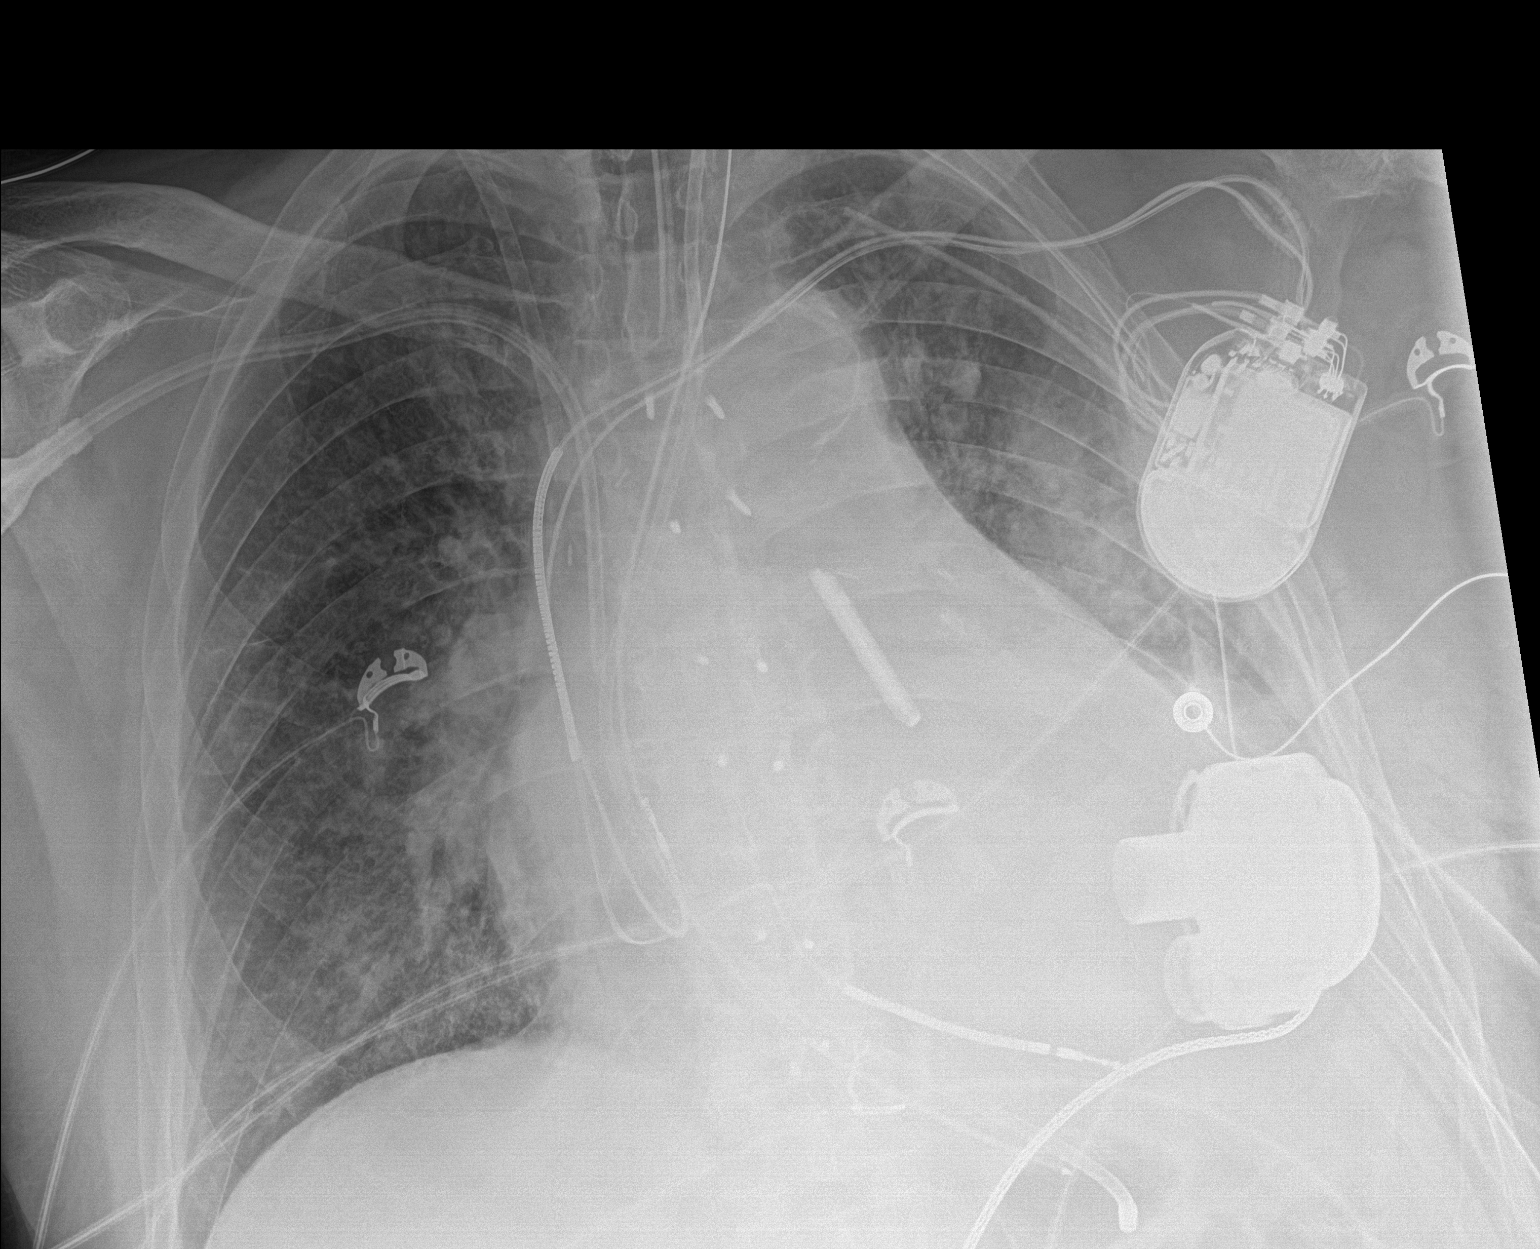

[1 of 1 positions shown; findings below may reference images not displayed]

FINDINGS: Endotracheal tube tip at the clavicular heads. There are 2 enteric
tubes in place, both tips in the proximal stomach. Right subclavian
central venous catheter tip in the mid SVC. Right internal jugular
central venous catheter tip in the lower SVC. Left-sided chest tubes
in place. Left ventricular assist device in place. Post left atrial
clipping. Stable cardiomegaly. Aortic atherosclerosis. Patchy
opacity in the left mid lung, not significantly changed.
Retrocardiac opacity may be worsened, possible pleural effusion. No
visualized pneumothorax. Overall vascular congestion.
IMPRESSION: 1. Endotracheal tube tip at the clavicular heads.
2. Two enteric tubes in place, both tips in the proximal stomach.
Additional support apparatus unchanged.
3. Unchanged cardiomegaly, vascular congestion, and left midlung
opacity. Retrocardiac opacity may be worsened.

## 2020-12-16 IMAGING — DX DG CHEST 1V PORT
1 series · 1 of 1 positions shown · non-contrast
Comparison: Radiograph of same day.

CLINICAL DATA: Left pleural effusion.

EXAM:
PORTABLE CHEST 1 VIEW

[chest ap]
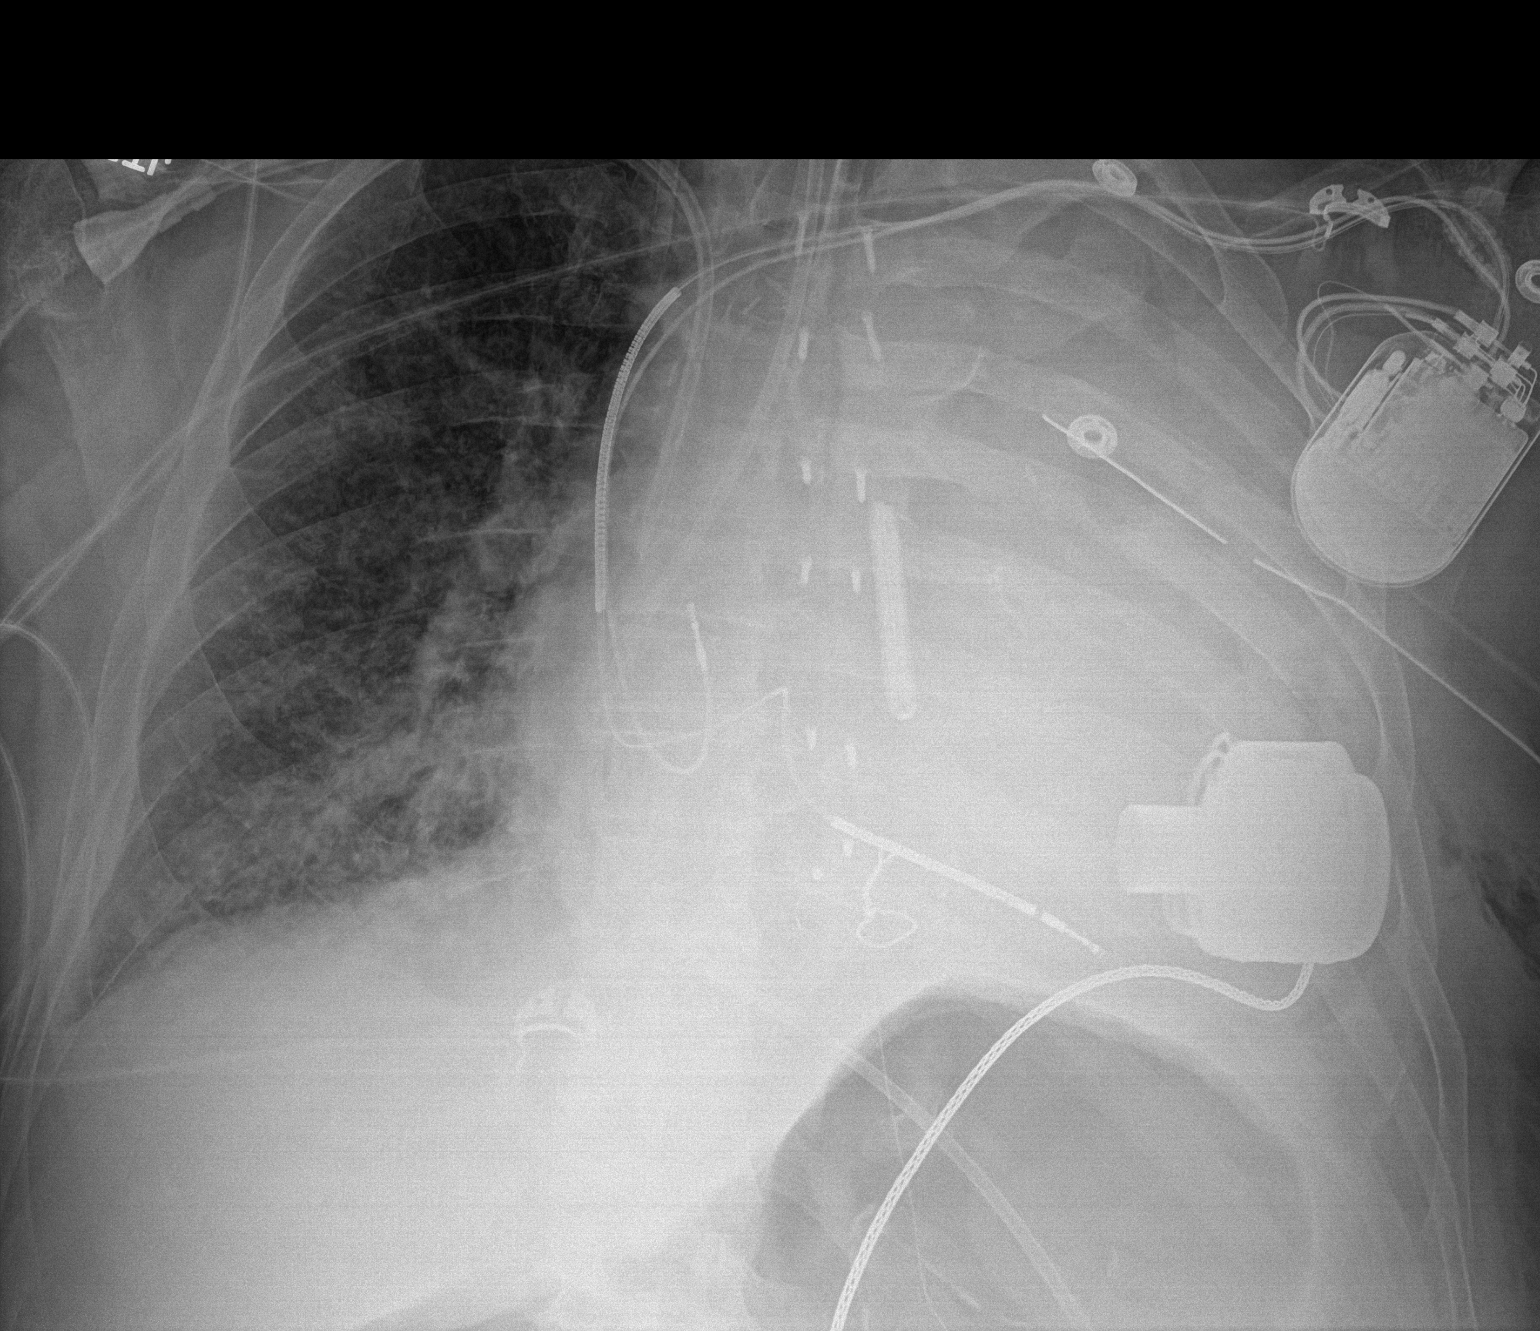

[1 of 1 positions shown; findings below may reference images not displayed]

FINDINGS: Endotracheal and feeding tubes are unchanged in position. Right
subclavian catheter is unchanged in position. Left ventricular
assist device is unchanged. Left-sided pacemaker is unchanged. No
pneumothorax is noted. There is complete opacification of the left
hemithorax consistent with effusion and/or atelectasis. Left-sided
chest tube is unchanged in position. Mild right basilar atelectasis
or edema is noted. Bony thorax is unremarkable.
IMPRESSION: Stable support apparatus. Continued diffuse opacification of left
hemithorax as described above. Mild right basilar atelectasis or
edema.

## 2020-12-19 IMAGING — DX DG CHEST 1V PORT
1 series · 1 of 1 positions shown · non-contrast
Comparison: 04/03/2020

CLINICAL DATA: LVAD

EXAM:
PORTABLE CHEST 1 VIEW

[chest]
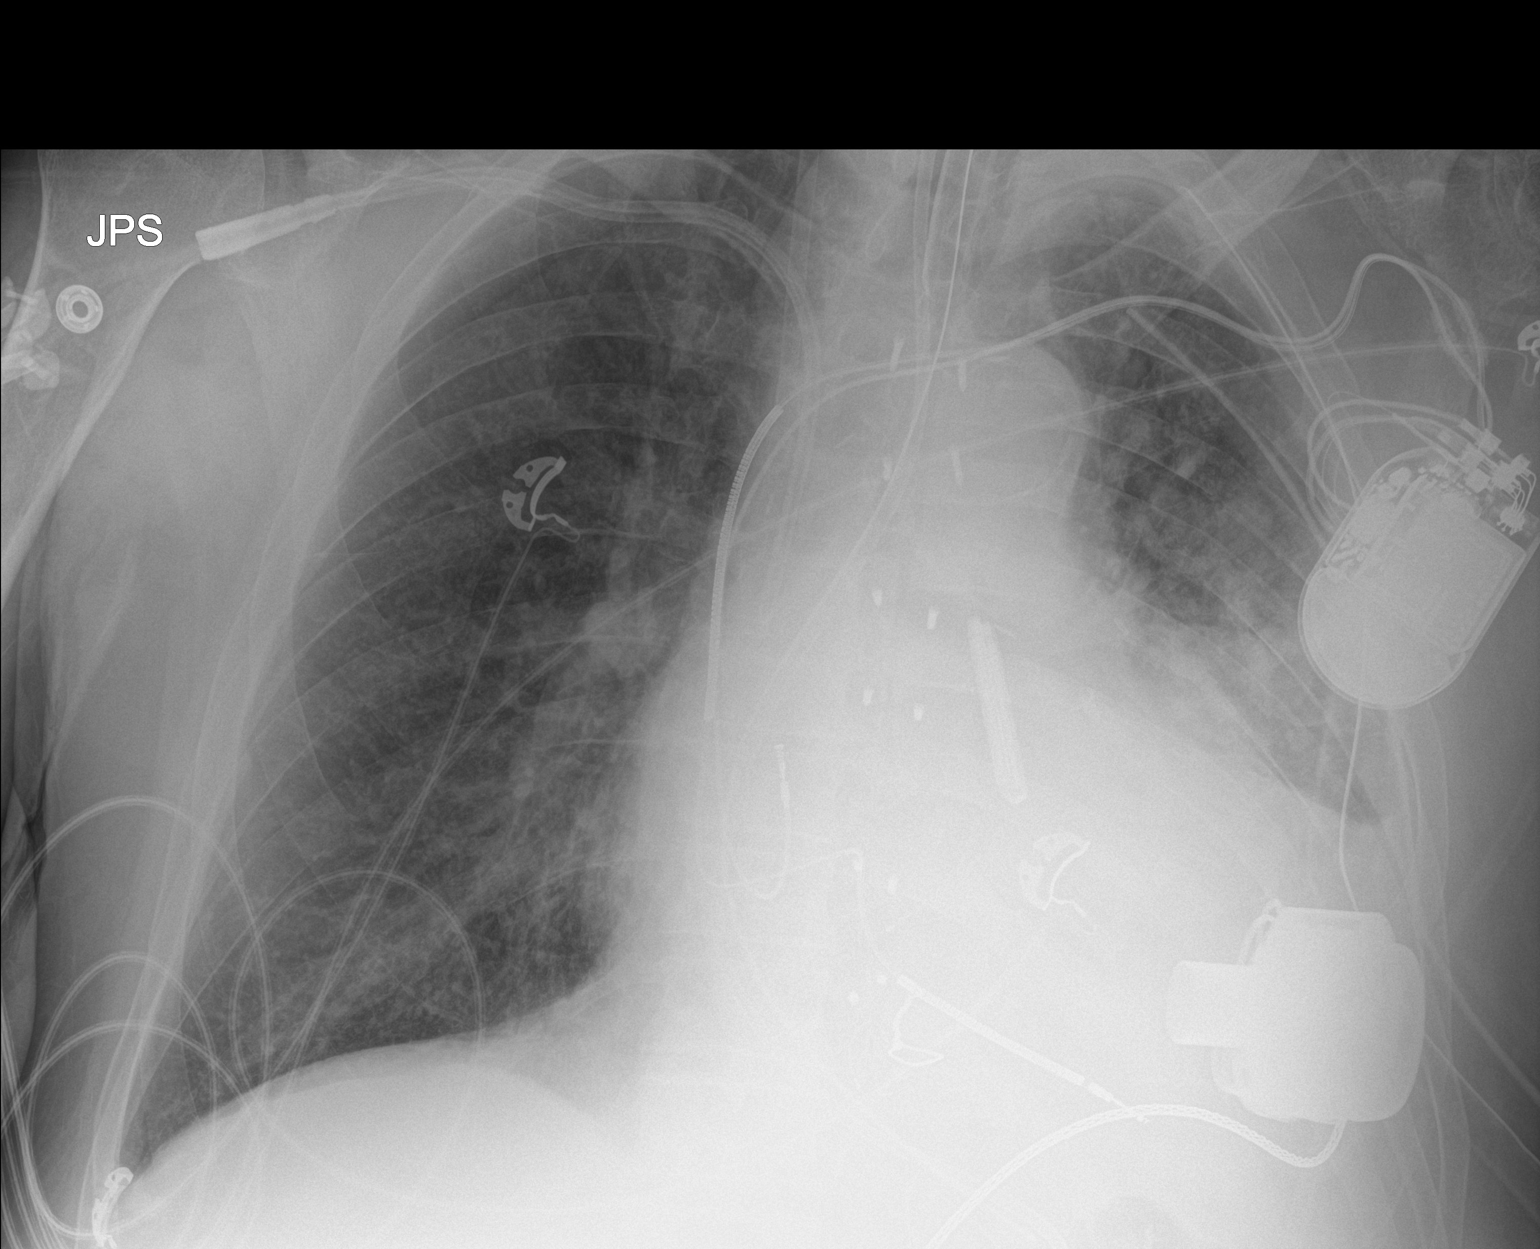

[1 of 1 positions shown; findings below may reference images not displayed]

FINDINGS: The endotracheal tube, feeding tube, right IJ Cordis and right
subclavian catheters are stable.

LVAD in good position without complicating features.

Persistent left pleural effusion and overlying atelectasis. Improved
right lung aeration. The left chest tube is stable. No pneumothorax.
IMPRESSION: 1. Stable support apparatus.
2. Improved right lung aeration.

## 2020-12-22 IMAGING — DX DG CHEST 1V
1 series · 1 of 1 positions shown · non-contrast
Comparison: 04/06/2020

CLINICAL DATA: Post open heart surgery

EXAM:
CHEST  1 VIEW

[chest ap]
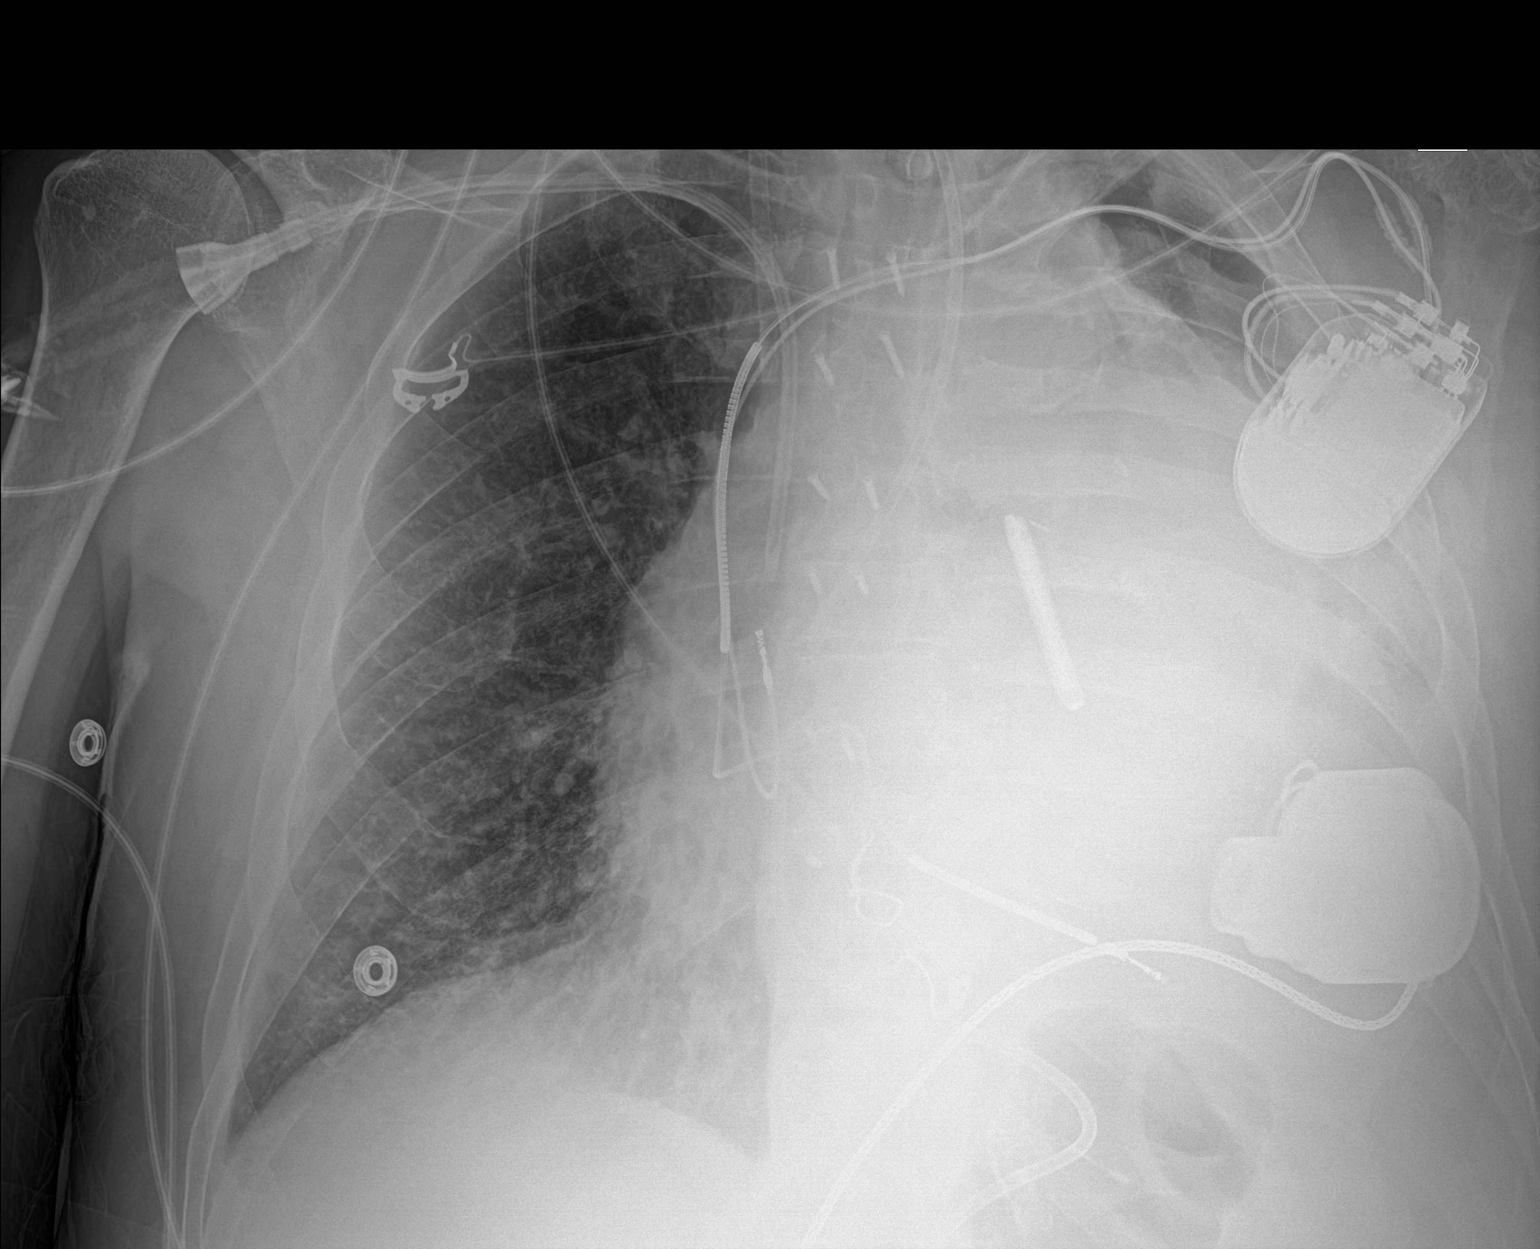

[1 of 1 positions shown; findings below may reference images not displayed]

FINDINGS: LVAD and left AICD remain in place, unchanged. Interval removal of
endotracheal tube. Right dialysis catheter is unchanged.
Cardiomegaly. Worsening aeration in the left lung may reflect a
combination of left effusion and atelectasis/consolidation. Minimal
right base atelectasis. Mild vascular congestion.
IMPRESSION: Support devices as above, in expected position.

Cardiomegaly, vascular congestion.

Increasing left lung airspace disease/effusion.
